# Patient Record
Sex: Female | Born: 1935 | Race: White | Hispanic: No | Marital: Married | Smoking: Never smoker
Health system: Southern US, Community
[De-identification: ages and names within clinical notes are randomized; demographics above are authoritative.]

## PROBLEM LIST (undated history)

## (undated) DIAGNOSIS — E538 Deficiency of other specified B group vitamins: Secondary | ICD-10-CM

## (undated) DIAGNOSIS — Z9289 Personal history of other medical treatment: Secondary | ICD-10-CM

## (undated) DIAGNOSIS — S329XXA Fracture of unspecified parts of lumbosacral spine and pelvis, initial encounter for closed fracture: Secondary | ICD-10-CM

## (undated) DIAGNOSIS — Z8669 Personal history of other diseases of the nervous system and sense organs: Secondary | ICD-10-CM

## (undated) DIAGNOSIS — Z8679 Personal history of other diseases of the circulatory system: Secondary | ICD-10-CM

## (undated) DIAGNOSIS — K631 Perforation of intestine (nontraumatic): Secondary | ICD-10-CM

## (undated) DIAGNOSIS — M1990 Unspecified osteoarthritis, unspecified site: Secondary | ICD-10-CM

## (undated) DIAGNOSIS — J189 Pneumonia, unspecified organism: Secondary | ICD-10-CM

## (undated) DIAGNOSIS — E669 Obesity, unspecified: Secondary | ICD-10-CM

## (undated) DIAGNOSIS — K5730 Diverticulosis of large intestine without perforation or abscess without bleeding: Secondary | ICD-10-CM

## (undated) DIAGNOSIS — N390 Urinary tract infection, site not specified: Secondary | ICD-10-CM

## (undated) DIAGNOSIS — E785 Hyperlipidemia, unspecified: Secondary | ICD-10-CM

## (undated) DIAGNOSIS — M519 Unspecified thoracic, thoracolumbar and lumbosacral intervertebral disc disorder: Secondary | ICD-10-CM

## (undated) DIAGNOSIS — Z789 Other specified health status: Secondary | ICD-10-CM

## (undated) DIAGNOSIS — R3915 Urgency of urination: Secondary | ICD-10-CM

## (undated) DIAGNOSIS — F411 Generalized anxiety disorder: Secondary | ICD-10-CM

## (undated) DIAGNOSIS — J45909 Unspecified asthma, uncomplicated: Secondary | ICD-10-CM

## (undated) DIAGNOSIS — K219 Gastro-esophageal reflux disease without esophagitis: Secondary | ICD-10-CM

## (undated) DIAGNOSIS — G4700 Insomnia, unspecified: Secondary | ICD-10-CM

## (undated) DIAGNOSIS — Z86010 Personal history of colonic polyps: Secondary | ICD-10-CM

## (undated) DIAGNOSIS — A0472 Enterocolitis due to Clostridium difficile, not specified as recurrent: Secondary | ICD-10-CM

## (undated) DIAGNOSIS — M869 Osteomyelitis, unspecified: Secondary | ICD-10-CM

## (undated) DIAGNOSIS — Z9884 Bariatric surgery status: Secondary | ICD-10-CM

## (undated) DIAGNOSIS — D539 Nutritional anemia, unspecified: Secondary | ICD-10-CM

## (undated) DIAGNOSIS — J309 Allergic rhinitis, unspecified: Secondary | ICD-10-CM

## (undated) DIAGNOSIS — B009 Herpesviral infection, unspecified: Secondary | ICD-10-CM

## (undated) DIAGNOSIS — E871 Hypo-osmolality and hyponatremia: Secondary | ICD-10-CM

## (undated) DIAGNOSIS — K439 Ventral hernia without obstruction or gangrene: Principal | ICD-10-CM

## (undated) DIAGNOSIS — I10 Essential (primary) hypertension: Secondary | ICD-10-CM

## (undated) HISTORY — PX: UPPER GI ENDOSCOPY: SHX6162

## (undated) HISTORY — DX: Nutritional anemia, unspecified: D53.9

## (undated) HISTORY — DX: Diverticulosis of large intestine without perforation or abscess without bleeding: K57.30

## (undated) HISTORY — DX: Personal history of colonic polyps: Z86.010

## (undated) HISTORY — DX: Insomnia, unspecified: G47.00

## (undated) HISTORY — DX: Herpesviral infection, unspecified: B00.9

## (undated) HISTORY — DX: Generalized anxiety disorder: F41.1

## (undated) HISTORY — PX: COLONOSCOPY: SHX174

## (undated) HISTORY — DX: Bariatric surgery status: Z98.84

## (undated) HISTORY — PX: CHOLECYSTECTOMY: SHX55

## (undated) HISTORY — DX: Enterocolitis due to Clostridium difficile, not specified as recurrent: A04.72

## (undated) HISTORY — DX: Obesity, unspecified: E66.9

## (undated) HISTORY — DX: Ventral hernia without obstruction or gangrene: K43.9

## (undated) HISTORY — DX: Deficiency of other specified B group vitamins: E53.8

## (undated) HISTORY — DX: Fracture of unspecified parts of lumbosacral spine and pelvis, initial encounter for closed fracture: S32.9XXA

## (undated) HISTORY — DX: Gastro-esophageal reflux disease without esophagitis: K21.9

## (undated) HISTORY — PX: VASCULAR SURGERY: SHX849

## (undated) HISTORY — DX: Unspecified asthma, uncomplicated: J45.909

## (undated) HISTORY — PX: TONSILLECTOMY: SUR1361

## (undated) HISTORY — DX: Hypo-osmolality and hyponatremia: E87.1

## (undated) HISTORY — DX: Urinary tract infection, site not specified: N39.0

## (undated) HISTORY — PX: OTHER SURGICAL HISTORY: SHX169

## (undated) HISTORY — DX: Unspecified thoracic, thoracolumbar and lumbosacral intervertebral disc disorder: M51.9

## (undated) HISTORY — DX: Allergic rhinitis, unspecified: J30.9

## (undated) HISTORY — DX: Urgency of urination: R39.15

---

## 1974-03-27 HISTORY — PX: OTHER SURGICAL HISTORY: SHX169

## 1992-03-27 HISTORY — PX: BACK SURGERY: SHX140

## 1997-08-04 ENCOUNTER — Ambulatory Visit (HOSPITAL_COMMUNITY): Admission: RE | Admit: 1997-08-04 | Discharge: 1997-08-04 | Payer: Self-pay | Admitting: Internal Medicine

## 1998-03-19 ENCOUNTER — Emergency Department (HOSPITAL_COMMUNITY): Admission: EM | Admit: 1998-03-19 | Discharge: 1998-03-19 | Payer: Self-pay | Admitting: Emergency Medicine

## 1998-03-19 ENCOUNTER — Encounter: Payer: Self-pay | Admitting: Emergency Medicine

## 1999-05-06 ENCOUNTER — Encounter (INDEPENDENT_AMBULATORY_CARE_PROVIDER_SITE_OTHER): Payer: Self-pay

## 1999-05-06 ENCOUNTER — Other Ambulatory Visit: Admission: RE | Admit: 1999-05-06 | Discharge: 1999-05-06 | Payer: Self-pay | Admitting: Obstetrics and Gynecology

## 1999-07-01 ENCOUNTER — Emergency Department (HOSPITAL_COMMUNITY): Admission: EM | Admit: 1999-07-01 | Discharge: 1999-07-01 | Payer: Self-pay | Admitting: Emergency Medicine

## 1999-07-01 ENCOUNTER — Encounter: Payer: Self-pay | Admitting: Emergency Medicine

## 2000-07-05 ENCOUNTER — Other Ambulatory Visit: Admission: RE | Admit: 2000-07-05 | Discharge: 2000-07-05 | Payer: Self-pay | Admitting: Obstetrics and Gynecology

## 2000-08-06 ENCOUNTER — Encounter: Payer: Self-pay | Admitting: Interventional Cardiology

## 2000-08-06 ENCOUNTER — Ambulatory Visit (HOSPITAL_COMMUNITY): Admission: RE | Admit: 2000-08-06 | Discharge: 2000-08-06 | Payer: Self-pay | Admitting: Interventional Cardiology

## 2000-09-17 ENCOUNTER — Encounter (INDEPENDENT_AMBULATORY_CARE_PROVIDER_SITE_OTHER): Payer: Self-pay

## 2000-09-17 ENCOUNTER — Ambulatory Visit (HOSPITAL_COMMUNITY): Admission: RE | Admit: 2000-09-17 | Discharge: 2000-09-17 | Payer: Self-pay | Admitting: Internal Medicine

## 2001-03-25 ENCOUNTER — Emergency Department (HOSPITAL_COMMUNITY): Admission: EM | Admit: 2001-03-25 | Discharge: 2001-03-26 | Payer: Self-pay | Admitting: Emergency Medicine

## 2001-03-26 ENCOUNTER — Encounter: Payer: Self-pay | Admitting: Emergency Medicine

## 2001-08-27 ENCOUNTER — Other Ambulatory Visit: Admission: RE | Admit: 2001-08-27 | Discharge: 2001-08-27 | Payer: Self-pay | Admitting: Obstetrics and Gynecology

## 2002-04-13 ENCOUNTER — Emergency Department (HOSPITAL_COMMUNITY): Admission: EM | Admit: 2002-04-13 | Discharge: 2002-04-13 | Payer: Self-pay | Admitting: Emergency Medicine

## 2002-08-11 ENCOUNTER — Ambulatory Visit (HOSPITAL_COMMUNITY): Admission: RE | Admit: 2002-08-11 | Discharge: 2002-08-11 | Payer: Self-pay | Admitting: Internal Medicine

## 2002-08-11 ENCOUNTER — Encounter: Payer: Self-pay | Admitting: Internal Medicine

## 2002-09-11 ENCOUNTER — Other Ambulatory Visit: Admission: RE | Admit: 2002-09-11 | Discharge: 2002-09-11 | Payer: Self-pay | Admitting: Obstetrics and Gynecology

## 2004-02-29 ENCOUNTER — Ambulatory Visit: Payer: Self-pay | Admitting: Internal Medicine

## 2004-03-16 ENCOUNTER — Ambulatory Visit: Payer: Self-pay | Admitting: Family Medicine

## 2004-03-17 ENCOUNTER — Encounter (HOSPITAL_COMMUNITY): Admission: RE | Admit: 2004-03-17 | Discharge: 2004-03-26 | Payer: Self-pay | Admitting: Family Medicine

## 2004-03-22 ENCOUNTER — Ambulatory Visit: Payer: Self-pay | Admitting: Family Medicine

## 2004-03-24 ENCOUNTER — Ambulatory Visit: Payer: Self-pay | Admitting: Family Medicine

## 2004-03-25 ENCOUNTER — Ambulatory Visit: Payer: Self-pay | Admitting: Internal Medicine

## 2004-03-29 ENCOUNTER — Ambulatory Visit: Payer: Self-pay | Admitting: Internal Medicine

## 2004-03-31 ENCOUNTER — Ambulatory Visit: Payer: Self-pay | Admitting: Family Medicine

## 2004-04-07 ENCOUNTER — Ambulatory Visit: Payer: Self-pay | Admitting: Family Medicine

## 2004-04-13 ENCOUNTER — Ambulatory Visit: Payer: Self-pay | Admitting: Internal Medicine

## 2004-04-13 ENCOUNTER — Ambulatory Visit (HOSPITAL_COMMUNITY): Admission: RE | Admit: 2004-04-13 | Discharge: 2004-04-13 | Payer: Self-pay | Admitting: Internal Medicine

## 2004-04-14 ENCOUNTER — Ambulatory Visit: Payer: Self-pay | Admitting: Family Medicine

## 2004-04-27 ENCOUNTER — Ambulatory Visit: Payer: Self-pay | Admitting: Family Medicine

## 2004-05-16 ENCOUNTER — Ambulatory Visit: Payer: Self-pay | Admitting: Family Medicine

## 2004-05-25 ENCOUNTER — Ambulatory Visit: Payer: Self-pay | Admitting: Internal Medicine

## 2004-06-23 ENCOUNTER — Ambulatory Visit: Payer: Self-pay | Admitting: Family Medicine

## 2004-07-11 ENCOUNTER — Ambulatory Visit: Payer: Self-pay | Admitting: Internal Medicine

## 2004-08-09 ENCOUNTER — Ambulatory Visit: Payer: Self-pay | Admitting: Family Medicine

## 2004-09-14 ENCOUNTER — Ambulatory Visit: Payer: Self-pay | Admitting: Family Medicine

## 2004-10-26 ENCOUNTER — Ambulatory Visit: Payer: Self-pay | Admitting: Family Medicine

## 2004-12-13 ENCOUNTER — Other Ambulatory Visit: Admission: RE | Admit: 2004-12-13 | Discharge: 2004-12-13 | Payer: Self-pay | Admitting: Obstetrics and Gynecology

## 2005-01-17 ENCOUNTER — Ambulatory Visit: Payer: Self-pay | Admitting: Family Medicine

## 2005-03-29 ENCOUNTER — Ambulatory Visit: Payer: Self-pay | Admitting: Family Medicine

## 2005-06-06 ENCOUNTER — Ambulatory Visit: Payer: Self-pay | Admitting: Family Medicine

## 2005-07-11 ENCOUNTER — Ambulatory Visit: Payer: Self-pay | Admitting: Family Medicine

## 2005-10-03 ENCOUNTER — Ambulatory Visit: Payer: Self-pay | Admitting: Family Medicine

## 2005-10-25 ENCOUNTER — Ambulatory Visit: Payer: Self-pay | Admitting: Family Medicine

## 2005-11-10 ENCOUNTER — Ambulatory Visit: Payer: Self-pay | Admitting: Family Medicine

## 2005-12-14 ENCOUNTER — Ambulatory Visit: Payer: Self-pay | Admitting: Family Medicine

## 2006-02-14 ENCOUNTER — Ambulatory Visit: Payer: Self-pay | Admitting: Internal Medicine

## 2006-04-13 ENCOUNTER — Encounter: Payer: Self-pay | Admitting: Internal Medicine

## 2006-06-14 ENCOUNTER — Ambulatory Visit: Payer: Self-pay | Admitting: Family Medicine

## 2006-06-14 LAB — CONVERTED CEMR LAB: Hemoglobin: 11.9 g/dL — ABNORMAL LOW (ref 12.0–15.0)

## 2006-06-25 ENCOUNTER — Encounter: Payer: Self-pay | Admitting: Family Medicine

## 2006-06-27 ENCOUNTER — Ambulatory Visit: Payer: Self-pay | Admitting: Family Medicine

## 2006-07-03 ENCOUNTER — Ambulatory Visit: Payer: Self-pay | Admitting: Family Medicine

## 2006-07-03 LAB — CONVERTED CEMR LAB
BUN: 15 mg/dL (ref 6–23)
Creatinine, Ser: 0.6 mg/dL (ref 0.4–1.2)

## 2006-07-04 ENCOUNTER — Encounter: Admission: RE | Admit: 2006-07-04 | Discharge: 2006-07-04 | Payer: Self-pay | Admitting: Family Medicine

## 2006-07-10 ENCOUNTER — Ambulatory Visit: Payer: Self-pay | Admitting: Family Medicine

## 2006-07-16 ENCOUNTER — Encounter: Payer: Self-pay | Admitting: Family Medicine

## 2006-07-26 ENCOUNTER — Ambulatory Visit: Payer: Self-pay | Admitting: Family Medicine

## 2006-07-26 LAB — CONVERTED CEMR LAB
ALT: 23 units/L (ref 0–40)
AST: 18 units/L (ref 0–37)
Albumin: 4.5 g/dL (ref 3.5–5.2)
Alkaline Phosphatase: 63 units/L (ref 39–117)
BUN: 9 mg/dL (ref 6–23)
Basophils Absolute: 0 10*3/uL (ref 0.0–0.1)
Basophils Relative: 0.4 % (ref 0.0–1.0)
Bilirubin, Direct: 0.1 mg/dL (ref 0.0–0.3)
CO2: 29 meq/L (ref 19–32)
Calcium: 9.7 mg/dL (ref 8.4–10.5)
Chloride: 99 meq/L (ref 96–112)
Cholesterol: 181 mg/dL (ref 0–200)
Creatinine, Ser: 0.6 mg/dL (ref 0.4–1.2)
Eosinophils Absolute: 0.1 10*3/uL (ref 0.0–0.6)
Eosinophils Relative: 0.8 % (ref 0.0–5.0)
GFR calc Af Amer: 127 mL/min
GFR calc non Af Amer: 105 mL/min
Glucose, Bld: 91 mg/dL (ref 70–99)
HCT: 40 % (ref 36.0–46.0)
HDL: 62.7 mg/dL (ref >39.0)
Hemoglobin: 13.8 g/dL (ref 12.0–15.0)
Hgb A1c MFr Bld: 5 % (ref 4.6–6.0)
LDL Cholesterol: 95 mg/dL (ref 0–99)
Lymphocytes Relative: 21 % (ref 12.0–46.0)
MCHC: 34.4 g/dL (ref 30.0–36.0)
MCV: 93.6 fL (ref 78.0–100.0)
Magnesium: 2.1 mg/dL (ref 1.5–2.5)
Monocytes Absolute: 0.8 10*3/uL — ABNORMAL HIGH (ref 0.2–0.7)
Monocytes Relative: 8.8 % (ref 3.0–11.0)
Neutro Abs: 6.1 10*3/uL (ref 1.4–7.7)
Neutrophils Relative %: 69 % (ref 43.0–77.0)
Platelets: 372 10*3/uL (ref 150–400)
Potassium: 3.2 meq/L — ABNORMAL LOW (ref 3.5–5.1)
RBC: 4.28 M/uL (ref 3.87–5.11)
RDW: 13.8 % (ref 11.5–14.6)
Sodium: 136 meq/L (ref 135–145)
TSH: 0.99 microintl units/mL (ref 0.35–5.50)
Total Bilirubin: 0.5 mg/dL (ref 0.3–1.2)
Total CHOL/HDL Ratio: 2.9
Total Protein: 7.8 g/dL (ref 6.0–8.3)
Triglycerides: 115 mg/dL (ref 0–149)
VLDL: 23 mg/dL (ref 0–40)
WBC: 8.9 10*3/uL (ref 4.5–10.5)

## 2006-08-02 ENCOUNTER — Encounter: Payer: Self-pay | Admitting: Family Medicine

## 2006-08-03 ENCOUNTER — Ambulatory Visit: Payer: Self-pay | Admitting: Family Medicine

## 2006-08-03 LAB — CONVERTED CEMR LAB: Hgb A1c MFr Bld: 5.3 % (ref 4.6–6.0)

## 2006-08-15 ENCOUNTER — Ambulatory Visit: Payer: Self-pay | Admitting: Family Medicine

## 2006-08-15 LAB — CONVERTED CEMR LAB: Hemoglobin: 12.4 g/dL (ref 12.0–15.0)

## 2006-08-23 ENCOUNTER — Encounter: Payer: Self-pay | Admitting: Family Medicine

## 2006-08-30 ENCOUNTER — Ambulatory Visit: Payer: Self-pay | Admitting: Family Medicine

## 2006-09-14 ENCOUNTER — Encounter: Payer: Self-pay | Admitting: Family Medicine

## 2006-09-18 ENCOUNTER — Encounter: Payer: Self-pay | Admitting: Family Medicine

## 2006-09-18 DIAGNOSIS — Z8719 Personal history of other diseases of the digestive system: Secondary | ICD-10-CM | POA: Insufficient documentation

## 2006-09-18 DIAGNOSIS — J309 Allergic rhinitis, unspecified: Secondary | ICD-10-CM | POA: Insufficient documentation

## 2006-09-18 DIAGNOSIS — J45909 Unspecified asthma, uncomplicated: Secondary | ICD-10-CM | POA: Insufficient documentation

## 2006-10-30 ENCOUNTER — Ambulatory Visit: Payer: Self-pay | Admitting: Family Medicine

## 2006-10-30 ENCOUNTER — Ambulatory Visit: Payer: Self-pay | Admitting: Vascular Surgery

## 2006-10-30 DIAGNOSIS — M549 Dorsalgia, unspecified: Secondary | ICD-10-CM

## 2006-10-30 DIAGNOSIS — M5489 Other dorsalgia: Secondary | ICD-10-CM | POA: Insufficient documentation

## 2006-12-12 ENCOUNTER — Encounter: Payer: Self-pay | Admitting: Family Medicine

## 2007-01-15 ENCOUNTER — Telehealth: Payer: Self-pay | Admitting: Family Medicine

## 2007-01-30 ENCOUNTER — Telehealth: Payer: Self-pay | Admitting: Family Medicine

## 2007-02-08 ENCOUNTER — Ambulatory Visit: Payer: Self-pay | Admitting: Internal Medicine

## 2007-02-08 LAB — CONVERTED CEMR LAB
Basophils Absolute: 0 10*3/uL (ref 0.0–0.1)
Basophils Relative: 0.6 % (ref 0.0–1.0)
Eosinophils Absolute: 0.2 10*3/uL (ref 0.0–0.6)
Eosinophils Relative: 3.1 % (ref 0.0–5.0)
HCT: 32.3 % — ABNORMAL LOW (ref 36.0–46.0)
Hemoglobin: 11 g/dL — ABNORMAL LOW (ref 12.0–15.0)
Lymphocytes Relative: 26.1 % (ref 12.0–46.0)
MCHC: 34.1 g/dL (ref 30.0–36.0)
MCV: 93.2 fL (ref 78.0–100.0)
Monocytes Absolute: 0.6 10*3/uL (ref 0.2–0.7)
Monocytes Relative: 8.1 % (ref 3.0–11.0)
Neutro Abs: 4.9 10*3/uL (ref 1.4–7.7)
Neutrophils Relative %: 62.1 % (ref 43.0–77.0)
Platelets: 327 10*3/uL (ref 150–400)
RBC: 3.47 M/uL — ABNORMAL LOW (ref 3.87–5.11)
RDW: 14.3 % (ref 11.5–14.6)
WBC: 7.7 10*3/uL (ref 4.5–10.5)

## 2007-04-11 ENCOUNTER — Encounter: Payer: Self-pay | Admitting: Family Medicine

## 2007-05-22 ENCOUNTER — Ambulatory Visit: Payer: Self-pay | Admitting: Internal Medicine

## 2007-07-30 ENCOUNTER — Telehealth: Payer: Self-pay | Admitting: Family Medicine

## 2007-08-06 ENCOUNTER — Telehealth: Payer: Self-pay | Admitting: Family Medicine

## 2007-08-07 ENCOUNTER — Telehealth: Payer: Self-pay | Admitting: Family Medicine

## 2007-08-08 ENCOUNTER — Ambulatory Visit: Payer: Self-pay | Admitting: Family Medicine

## 2007-08-08 DIAGNOSIS — R5383 Other fatigue: Secondary | ICD-10-CM

## 2007-08-08 DIAGNOSIS — F411 Generalized anxiety disorder: Secondary | ICD-10-CM | POA: Insufficient documentation

## 2007-08-08 DIAGNOSIS — D539 Nutritional anemia, unspecified: Secondary | ICD-10-CM | POA: Insufficient documentation

## 2007-08-08 DIAGNOSIS — Z9884 Bariatric surgery status: Secondary | ICD-10-CM | POA: Insufficient documentation

## 2007-08-08 DIAGNOSIS — G4700 Insomnia, unspecified: Secondary | ICD-10-CM | POA: Insufficient documentation

## 2007-08-08 DIAGNOSIS — R5381 Other malaise: Secondary | ICD-10-CM | POA: Insufficient documentation

## 2007-08-08 DIAGNOSIS — N3000 Acute cystitis without hematuria: Secondary | ICD-10-CM | POA: Insufficient documentation

## 2007-08-08 LAB — CONVERTED CEMR LAB
Bilirubin Urine: NEGATIVE
Blood in Urine, dipstick: NEGATIVE
Glucose, Urine, Semiquant: NEGATIVE
Ketones, urine, test strip: NEGATIVE
Nitrite: NEGATIVE
Protein, U semiquant: NEGATIVE
Specific Gravity, Urine: <1.005
Urobilinogen, UA: 0.2
WBC Urine, dipstick: NEGATIVE
pH: 5

## 2007-08-14 ENCOUNTER — Telehealth: Payer: Self-pay | Admitting: Family Medicine

## 2007-08-14 LAB — CONVERTED CEMR LAB
Basophils Absolute: 0 10*3/uL (ref 0.0–0.1)
Basophils Relative: 0.2 % (ref 0.0–1.0)
Eosinophils Absolute: 0.1 10*3/uL (ref 0.0–0.7)
Eosinophils Relative: 2 % (ref 0.0–5.0)
HCT: 34.7 % — ABNORMAL LOW (ref 36.0–46.0)
Hemoglobin: 11.6 g/dL — ABNORMAL LOW (ref 12.0–15.0)
Lymphocytes Relative: 32.8 % (ref 12.0–46.0)
MCHC: 33.4 g/dL (ref 30.0–36.0)
MCV: 93.3 fL (ref 78.0–100.0)
Monocytes Absolute: 0.6 10*3/uL (ref 0.1–1.0)
Monocytes Relative: 10.3 % (ref 3.0–12.0)
Neutro Abs: 3.3 10*3/uL (ref 1.4–7.7)
Neutrophils Relative %: 54.7 % (ref 43.0–77.0)
Platelets: 304 10*3/uL (ref 150–400)
RBC: 3.72 M/uL — ABNORMAL LOW (ref 3.87–5.11)
RDW: 14.2 % (ref 11.5–14.6)
WBC: 6 10*3/uL (ref 4.5–10.5)

## 2007-08-21 ENCOUNTER — Telehealth: Payer: Self-pay | Admitting: Family Medicine

## 2007-09-26 ENCOUNTER — Ambulatory Visit: Payer: Self-pay | Admitting: Family Medicine

## 2007-09-26 DIAGNOSIS — R3915 Urgency of urination: Secondary | ICD-10-CM | POA: Insufficient documentation

## 2007-09-26 LAB — CONVERTED CEMR LAB
Bilirubin Urine: NEGATIVE
Blood in Urine, dipstick: NEGATIVE
Glucose, Urine, Semiquant: NEGATIVE
Ketones, urine, test strip: NEGATIVE
Nitrite: NEGATIVE
Protein, U semiquant: NEGATIVE
Specific Gravity, Urine: 1.01
Urobilinogen, UA: 0.2
WBC Urine, dipstick: NEGATIVE
pH: 6

## 2007-10-01 ENCOUNTER — Telehealth: Payer: Self-pay | Admitting: Family Medicine

## 2007-10-08 ENCOUNTER — Telehealth: Payer: Self-pay | Admitting: Family Medicine

## 2007-10-21 ENCOUNTER — Encounter: Payer: Self-pay | Admitting: Family Medicine

## 2007-10-25 ENCOUNTER — Encounter: Admission: RE | Admit: 2007-10-25 | Discharge: 2007-10-25 | Payer: Self-pay | Admitting: Neurosurgery

## 2007-10-27 ENCOUNTER — Emergency Department (HOSPITAL_COMMUNITY): Admission: EM | Admit: 2007-10-27 | Discharge: 2007-10-27 | Payer: Self-pay | Admitting: Emergency Medicine

## 2007-10-30 ENCOUNTER — Telehealth: Payer: Self-pay | Admitting: Family Medicine

## 2007-11-19 ENCOUNTER — Encounter: Admission: RE | Admit: 2007-11-19 | Discharge: 2007-11-19 | Payer: Self-pay | Admitting: Neurosurgery

## 2007-11-20 ENCOUNTER — Ambulatory Visit: Payer: Self-pay | Admitting: Family Medicine

## 2007-11-20 DIAGNOSIS — K5289 Other specified noninfective gastroenteritis and colitis: Secondary | ICD-10-CM | POA: Insufficient documentation

## 2007-11-20 LAB — CONVERTED CEMR LAB
Bilirubin Urine: NEGATIVE
Glucose, Urine, Semiquant: NEGATIVE
Ketones, urine, test strip: NEGATIVE
Nitrite: POSITIVE
Specific Gravity, Urine: <1.005
Urobilinogen, UA: 0.2
pH: 5.5

## 2007-11-21 ENCOUNTER — Telehealth: Payer: Self-pay | Admitting: Family Medicine

## 2007-11-21 ENCOUNTER — Encounter: Payer: Self-pay | Admitting: Family Medicine

## 2007-11-22 LAB — CONVERTED CEMR LAB
BUN: 16 mg/dL (ref 6–23)
Basophils Absolute: 0 10*3/uL (ref 0.0–0.1)
Basophils Relative: 0.4 % (ref 0.0–3.0)
CO2: 28 meq/L (ref 19–32)
Calcium: 8.9 mg/dL (ref 8.4–10.5)
Chloride: 107 meq/L (ref 96–112)
Creatinine, Ser: 0.6 mg/dL (ref 0.4–1.2)
Eosinophils Absolute: 0.3 10*3/uL (ref 0.0–0.7)
Eosinophils Relative: 3.8 % (ref 0.0–5.0)
GFR calc Af Amer: 126 mL/min
GFR calc non Af Amer: 104 mL/min
Glucose, Bld: 89 mg/dL (ref 70–99)
HCT: 27.6 % — ABNORMAL LOW (ref 36.0–46.0)
Hemoglobin: 9.4 g/dL — ABNORMAL LOW (ref 12.0–15.0)
Lymphocytes Relative: 27 % (ref 12.0–46.0)
MCHC: 34.3 g/dL (ref 30.0–36.0)
MCV: 98.5 fL (ref 78.0–100.0)
Monocytes Absolute: 0.7 10*3/uL (ref 0.1–1.0)
Monocytes Relative: 9.7 % (ref 3.0–12.0)
Neutro Abs: 4 10*3/uL (ref 1.4–7.7)
Neutrophils Relative %: 59.1 % (ref 43.0–77.0)
Platelets: 294 10*3/uL (ref 150–400)
Potassium: 3.5 meq/L (ref 3.5–5.1)
RBC: 2.8 M/uL — ABNORMAL LOW (ref 3.87–5.11)
RDW: 15.9 % — ABNORMAL HIGH (ref 11.5–14.6)
Sodium: 139 meq/L (ref 135–145)
WBC: 6.9 10*3/uL (ref 4.5–10.5)

## 2007-11-28 ENCOUNTER — Telehealth: Payer: Self-pay | Admitting: Family Medicine

## 2007-12-04 ENCOUNTER — Telehealth: Payer: Self-pay | Admitting: Family Medicine

## 2007-12-05 ENCOUNTER — Ambulatory Visit: Payer: Self-pay | Admitting: Family Medicine

## 2007-12-05 LAB — CONVERTED CEMR LAB
Bilirubin Urine: NEGATIVE
Blood in Urine, dipstick: NEGATIVE
Glucose, Urine, Semiquant: NEGATIVE
Hemoglobin: 10.8 g/dL — ABNORMAL LOW (ref 12.0–15.0)
Ketones, urine, test strip: NEGATIVE
Nitrite: NEGATIVE
Protein, U semiquant: NEGATIVE
Specific Gravity, Urine: <1.005
Urobilinogen, UA: 0.2
pH: 6

## 2007-12-30 ENCOUNTER — Encounter: Payer: Self-pay | Admitting: Family Medicine

## 2008-01-16 ENCOUNTER — Telehealth: Payer: Self-pay | Admitting: Family Medicine

## 2008-01-21 ENCOUNTER — Telehealth: Payer: Self-pay | Admitting: Family Medicine

## 2008-01-22 ENCOUNTER — Ambulatory Visit: Payer: Self-pay | Admitting: Family Medicine

## 2008-01-29 ENCOUNTER — Telehealth: Payer: Self-pay | Admitting: Family Medicine

## 2008-01-29 LAB — CONVERTED CEMR LAB: Hemoglobin: 12.1 g/dL (ref 12.0–15.0)

## 2008-02-11 ENCOUNTER — Ambulatory Visit: Payer: Self-pay | Admitting: Family Medicine

## 2008-02-11 LAB — CONVERTED CEMR LAB
Bilirubin Urine: NEGATIVE
Glucose, Urine, Semiquant: NEGATIVE
Ketones, urine, test strip: NEGATIVE
Nitrite: POSITIVE
Protein, U semiquant: NEGATIVE
Specific Gravity, Urine: <1.005
Urobilinogen, UA: 0.2
pH: 6

## 2008-02-12 ENCOUNTER — Telehealth: Payer: Self-pay | Admitting: Family Medicine

## 2008-02-25 ENCOUNTER — Ambulatory Visit: Payer: Self-pay | Admitting: Family Medicine

## 2008-02-25 LAB — CONVERTED CEMR LAB
Bilirubin Urine: NEGATIVE
Blood in Urine, dipstick: NEGATIVE
Glucose, Urine, Semiquant: NEGATIVE
Ketones, urine, test strip: NEGATIVE
Nitrite: POSITIVE
Protein, U semiquant: NEGATIVE
Specific Gravity, Urine: <1.005
Urobilinogen, UA: 0.2
pH: 6.5

## 2008-02-26 ENCOUNTER — Ambulatory Visit: Payer: Self-pay | Admitting: Family Medicine

## 2008-02-26 ENCOUNTER — Telehealth: Payer: Self-pay | Admitting: Family Medicine

## 2008-02-26 DIAGNOSIS — N390 Urinary tract infection, site not specified: Secondary | ICD-10-CM | POA: Insufficient documentation

## 2008-02-27 ENCOUNTER — Encounter: Payer: Self-pay | Admitting: Family Medicine

## 2008-02-27 ENCOUNTER — Telehealth: Payer: Self-pay | Admitting: Family Medicine

## 2008-03-04 ENCOUNTER — Ambulatory Visit: Payer: Self-pay | Admitting: Family Medicine

## 2008-03-04 DIAGNOSIS — R209 Unspecified disturbances of skin sensation: Secondary | ICD-10-CM | POA: Insufficient documentation

## 2008-03-27 DIAGNOSIS — Z860100 Personal history of colon polyps, unspecified: Secondary | ICD-10-CM

## 2008-03-27 HISTORY — DX: Personal history of colon polyps, unspecified: Z86.0100

## 2008-03-27 HISTORY — DX: Personal history of colonic polyps: Z86.010

## 2008-04-06 ENCOUNTER — Encounter: Payer: Self-pay | Admitting: Family Medicine

## 2008-04-09 ENCOUNTER — Ambulatory Visit: Payer: Self-pay | Admitting: Family Medicine

## 2008-04-09 LAB — CONVERTED CEMR LAB
Bilirubin Urine: NEGATIVE
Glucose, Urine, Semiquant: NEGATIVE
Ketones, urine, test strip: NEGATIVE
Nitrite: POSITIVE
Protein, U semiquant: NEGATIVE
Specific Gravity, Urine: 1.01
Urobilinogen, UA: 0.2
pH: 6

## 2008-04-10 ENCOUNTER — Encounter: Payer: Self-pay | Admitting: Family Medicine

## 2008-04-13 ENCOUNTER — Telehealth: Payer: Self-pay | Admitting: Family Medicine

## 2008-04-14 ENCOUNTER — Ambulatory Visit: Payer: Self-pay | Admitting: Family Medicine

## 2008-04-16 ENCOUNTER — Ambulatory Visit: Payer: Self-pay | Admitting: Family Medicine

## 2008-04-16 ENCOUNTER — Telehealth: Payer: Self-pay | Admitting: Family Medicine

## 2008-04-16 DIAGNOSIS — J040 Acute laryngitis: Secondary | ICD-10-CM | POA: Insufficient documentation

## 2008-04-16 DIAGNOSIS — J029 Acute pharyngitis, unspecified: Secondary | ICD-10-CM | POA: Insufficient documentation

## 2008-04-24 ENCOUNTER — Encounter: Payer: Self-pay | Admitting: Family Medicine

## 2008-05-11 ENCOUNTER — Encounter: Payer: Self-pay | Admitting: Family Medicine

## 2008-06-04 ENCOUNTER — Telehealth: Payer: Self-pay | Admitting: Family Medicine

## 2008-06-05 ENCOUNTER — Telehealth: Payer: Self-pay | Admitting: Internal Medicine

## 2008-06-08 ENCOUNTER — Ambulatory Visit: Payer: Self-pay | Admitting: Internal Medicine

## 2008-06-09 LAB — CONVERTED CEMR LAB
Basophils Absolute: 0.1 10*3/uL (ref 0.0–0.1)
Basophils Relative: 0.8 % (ref 0.0–3.0)
Eosinophils Absolute: 0.3 10*3/uL (ref 0.0–0.7)
Eosinophils Relative: 5.3 % — ABNORMAL HIGH (ref 0.0–5.0)
HCT: 33.1 % — ABNORMAL LOW (ref 36.0–46.0)
Hemoglobin: 11.2 g/dL — ABNORMAL LOW (ref 12.0–15.0)
Iron: 50 ug/dL (ref 42–145)
Lymphocytes Relative: 30.6 % (ref 12.0–46.0)
MCHC: 34 g/dL (ref 30.0–36.0)
MCV: 93.6 fL (ref 78.0–100.0)
Monocytes Absolute: 0.7 10*3/uL (ref 0.1–1.0)
Monocytes Relative: 10.7 % (ref 3.0–12.0)
Neutro Abs: 3.3 10*3/uL (ref 1.4–7.7)
Neutrophils Relative %: 52.6 % (ref 43.0–77.0)
Platelets: 252 10*3/uL (ref 150–400)
RBC: 3.53 M/uL — ABNORMAL LOW (ref 3.87–5.11)
RDW: 14.6 % (ref 11.5–14.6)
Saturation Ratios: 17 % — ABNORMAL LOW (ref 20.0–50.0)
Sed Rate: 30 mm/hr — ABNORMAL HIGH (ref 0–22)
Transferrin: 210.3 mg/dL — ABNORMAL LOW (ref 212.0–360.0)
Vitamin B-12: 653 pg/mL (ref 211–911)
WBC: 6.4 10*3/uL (ref 4.5–10.5)

## 2008-06-12 ENCOUNTER — Encounter: Payer: Self-pay | Admitting: Family Medicine

## 2008-06-25 ENCOUNTER — Telehealth: Payer: Self-pay | Admitting: Family Medicine

## 2008-07-07 ENCOUNTER — Ambulatory Visit: Payer: Self-pay | Admitting: Internal Medicine

## 2008-07-08 LAB — CONVERTED CEMR LAB
Basophils Absolute: 0.1 10*3/uL (ref 0.0–0.1)
Basophils Relative: 0.8 % (ref 0.0–3.0)
Eosinophils Absolute: 0.2 10*3/uL (ref 0.0–0.7)
Eosinophils Relative: 2.5 % (ref 0.0–5.0)
HCT: 31.3 % — ABNORMAL LOW (ref 36.0–46.0)
Hemoglobin: 10.8 g/dL — ABNORMAL LOW (ref 12.0–15.0)
Iron: 50 ug/dL (ref 42–145)
Lymphocytes Relative: 30.4 % (ref 12.0–46.0)
Lymphs Abs: 2.3 10*3/uL (ref 0.7–4.0)
MCHC: 34.5 g/dL (ref 30.0–36.0)
MCV: 92.6 fL (ref 78.0–100.0)
Monocytes Absolute: 0.6 10*3/uL (ref 0.1–1.0)
Monocytes Relative: 7.8 % (ref 3.0–12.0)
Neutro Abs: 4.3 10*3/uL (ref 1.4–7.7)
Neutrophils Relative %: 58.5 % (ref 43.0–77.0)
Platelets: 244 10*3/uL (ref 150.0–400.0)
RBC: 3.38 M/uL — ABNORMAL LOW (ref 3.87–5.11)
RDW: 14.4 % (ref 11.5–14.6)
Saturation Ratios: 16.9 % — ABNORMAL LOW (ref 20.0–50.0)
Sed Rate: 22 mm/hr (ref 0–22)
Transferrin: 211.7 mg/dL — ABNORMAL LOW (ref 212.0–360.0)
Vitamin B-12: 261 pg/mL (ref 211–911)
WBC: 7.5 10*3/uL (ref 4.5–10.5)

## 2008-07-09 DIAGNOSIS — Z860100 Personal history of colon polyps, unspecified: Secondary | ICD-10-CM | POA: Insufficient documentation

## 2008-07-09 DIAGNOSIS — K222 Esophageal obstruction: Secondary | ICD-10-CM | POA: Insufficient documentation

## 2008-07-09 DIAGNOSIS — K5730 Diverticulosis of large intestine without perforation or abscess without bleeding: Secondary | ICD-10-CM | POA: Insufficient documentation

## 2008-07-09 DIAGNOSIS — D649 Anemia, unspecified: Secondary | ICD-10-CM | POA: Insufficient documentation

## 2008-07-09 DIAGNOSIS — E538 Deficiency of other specified B group vitamins: Secondary | ICD-10-CM | POA: Insufficient documentation

## 2008-07-13 ENCOUNTER — Telehealth: Payer: Self-pay | Admitting: Internal Medicine

## 2008-07-13 ENCOUNTER — Ambulatory Visit: Payer: Self-pay | Admitting: Internal Medicine

## 2008-07-15 ENCOUNTER — Ambulatory Visit (HOSPITAL_COMMUNITY): Admission: RE | Admit: 2008-07-15 | Discharge: 2008-07-15 | Payer: Self-pay | Admitting: Internal Medicine

## 2008-07-15 ENCOUNTER — Encounter: Payer: Self-pay | Admitting: Internal Medicine

## 2008-07-20 ENCOUNTER — Telehealth: Payer: Self-pay | Admitting: Internal Medicine

## 2008-08-03 ENCOUNTER — Ambulatory Visit: Payer: Self-pay | Admitting: Internal Medicine

## 2008-08-04 ENCOUNTER — Ambulatory Visit: Payer: Self-pay | Admitting: Internal Medicine

## 2008-08-04 LAB — CONVERTED CEMR LAB
Basophils Absolute: 0 10*3/uL (ref 0.0–0.1)
Basophils Relative: 0.5 % (ref 0.0–3.0)
CEA: 5.5 ng/mL — ABNORMAL HIGH (ref 0.0–5.0)
Eosinophils Absolute: 0.3 10*3/uL (ref 0.0–0.7)
Eosinophils Relative: 4 % (ref 0.0–5.0)
HCT: 33 % — ABNORMAL LOW (ref 36.0–46.0)
Hemoglobin: 11.4 g/dL — ABNORMAL LOW (ref 12.0–15.0)
Iron: 51 ug/dL (ref 42–145)
Lymphocytes Relative: 28.7 % (ref 12.0–46.0)
Lymphs Abs: 2.4 10*3/uL (ref 0.7–4.0)
MCHC: 34.6 g/dL (ref 30.0–36.0)
MCV: 95.8 fL (ref 78.0–100.0)
Monocytes Absolute: 0.8 10*3/uL (ref 0.1–1.0)
Monocytes Relative: 9.8 % (ref 3.0–12.0)
Neutro Abs: 4.8 10*3/uL (ref 1.4–7.7)
Neutrophils Relative %: 57 % (ref 43.0–77.0)
Platelets: 254 10*3/uL (ref 150.0–400.0)
RBC: 3.45 M/uL — ABNORMAL LOW (ref 3.87–5.11)
RDW: 14.2 % (ref 11.5–14.6)
Saturation Ratios: 17.4 % — ABNORMAL LOW (ref 20.0–50.0)
Tissue Transglutaminase Ab, IgA: 0.5 units (ref <7)
Transferrin: 209.6 mg/dL — ABNORMAL LOW (ref 212.0–360.0)
WBC: 8.3 10*3/uL (ref 4.5–10.5)

## 2008-08-06 ENCOUNTER — Encounter: Payer: Self-pay | Admitting: Internal Medicine

## 2008-08-25 ENCOUNTER — Encounter: Payer: Self-pay | Admitting: Internal Medicine

## 2008-08-25 ENCOUNTER — Ambulatory Visit: Payer: Self-pay | Admitting: Internal Medicine

## 2008-08-25 LAB — HM COLONOSCOPY

## 2008-08-26 ENCOUNTER — Telehealth: Payer: Self-pay | Admitting: Internal Medicine

## 2008-08-26 ENCOUNTER — Telehealth: Payer: Self-pay | Admitting: Family Medicine

## 2008-08-26 LAB — CONVERTED CEMR LAB
Basophils Absolute: 0 10*3/uL (ref 0.0–0.1)
Basophils Relative: 0.6 % (ref 0.0–3.0)
Eosinophils Absolute: 0.4 10*3/uL (ref 0.0–0.7)
Eosinophils Relative: 5.5 % — ABNORMAL HIGH (ref 0.0–5.0)
HCT: 35.2 % — ABNORMAL LOW (ref 36.0–46.0)
Hemoglobin: 11.8 g/dL — ABNORMAL LOW (ref 12.0–15.0)
Iron: 34 ug/dL — ABNORMAL LOW (ref 42–145)
Lymphocytes Relative: 27.7 % (ref 12.0–46.0)
Lymphs Abs: 1.8 10*3/uL (ref 0.7–4.0)
MCHC: 33.5 g/dL (ref 30.0–36.0)
MCV: 95.6 fL (ref 78.0–100.0)
Monocytes Absolute: 0.6 10*3/uL (ref 0.1–1.0)
Monocytes Relative: 8.8 % (ref 3.0–12.0)
Neutro Abs: 3.6 10*3/uL (ref 1.4–7.7)
Neutrophils Relative %: 57.4 % (ref 43.0–77.0)
Platelets: 246 10*3/uL (ref 150.0–400.0)
RBC: 3.68 M/uL — ABNORMAL LOW (ref 3.87–5.11)
RDW: 14.1 % (ref 11.5–14.6)
Saturation Ratios: 12.5 % — ABNORMAL LOW (ref 20.0–50.0)
Transferrin: 194.8 mg/dL — ABNORMAL LOW (ref 212.0–360.0)
WBC: 6.4 10*3/uL (ref 4.5–10.5)

## 2008-08-28 ENCOUNTER — Encounter: Payer: Self-pay | Admitting: Internal Medicine

## 2008-09-07 ENCOUNTER — Encounter: Payer: Self-pay | Admitting: Internal Medicine

## 2008-09-07 ENCOUNTER — Encounter (HOSPITAL_COMMUNITY): Admission: RE | Admit: 2008-09-07 | Discharge: 2008-09-23 | Payer: Self-pay | Admitting: Internal Medicine

## 2008-09-17 ENCOUNTER — Telehealth: Payer: Self-pay | Admitting: Family Medicine

## 2008-10-08 ENCOUNTER — Ambulatory Visit: Payer: Self-pay | Admitting: Internal Medicine

## 2008-10-09 LAB — CONVERTED CEMR LAB
Basophils Absolute: 0 10*3/uL (ref 0.0–0.1)
Basophils Relative: 0.7 % (ref 0.0–3.0)
CO2: 29 meq/L (ref 19–32)
Chloride: 103 meq/L (ref 96–112)
Eosinophils Absolute: 0.2 10*3/uL (ref 0.0–0.7)
Eosinophils Relative: 3 % (ref 0.0–5.0)
HCT: 34.7 % — ABNORMAL LOW (ref 36.0–46.0)
Hemoglobin: 11.9 g/dL — ABNORMAL LOW (ref 12.0–15.0)
Iron: 56 ug/dL (ref 42–145)
Lymphocytes Relative: 27.7 % (ref 12.0–46.0)
Lymphs Abs: 1.9 10*3/uL (ref 0.7–4.0)
MCHC: 34.4 g/dL (ref 30.0–36.0)
MCV: 95.8 fL (ref 78.0–100.0)
Monocytes Absolute: 0.6 10*3/uL (ref 0.1–1.0)
Monocytes Relative: 9.3 % (ref 3.0–12.0)
Neutro Abs: 4.2 10*3/uL (ref 1.4–7.7)
Neutrophils Relative %: 59.3 % (ref 43.0–77.0)
Platelets: 271 10*3/uL (ref 150.0–400.0)
Potassium: 4.1 meq/L (ref 3.5–5.1)
RBC: 3.62 M/uL — ABNORMAL LOW (ref 3.87–5.11)
RDW: 13.3 % (ref 11.5–14.6)
Saturation Ratios: 20.5 % (ref 20.0–50.0)
Sodium: 137 meq/L (ref 135–145)
Transferrin: 195.3 mg/dL — ABNORMAL LOW (ref 212.0–360.0)
WBC: 6.9 10*3/uL (ref 4.5–10.5)

## 2008-10-13 ENCOUNTER — Encounter: Payer: Self-pay | Admitting: Family Medicine

## 2008-11-04 ENCOUNTER — Ambulatory Visit: Payer: Self-pay | Admitting: Family Medicine

## 2008-11-05 ENCOUNTER — Telehealth: Payer: Self-pay | Admitting: Family Medicine

## 2008-11-24 ENCOUNTER — Ambulatory Visit: Payer: Self-pay | Admitting: Internal Medicine

## 2008-11-24 DIAGNOSIS — D509 Iron deficiency anemia, unspecified: Secondary | ICD-10-CM | POA: Insufficient documentation

## 2008-11-25 LAB — CONVERTED CEMR LAB
Basophils Absolute: 0 10*3/uL (ref 0.0–0.1)
Basophils Relative: 0.5 % (ref 0.0–3.0)
CO2: 27 meq/L (ref 19–32)
Chloride: 108 meq/L (ref 96–112)
Eosinophils Absolute: 0.9 10*3/uL — ABNORMAL HIGH (ref 0.0–0.7)
Eosinophils Relative: 9 % — ABNORMAL HIGH (ref 0.0–5.0)
HCT: 35.7 % — ABNORMAL LOW (ref 36.0–46.0)
Hemoglobin: 11.9 g/dL — ABNORMAL LOW (ref 12.0–15.0)
Iron: 40 ug/dL — ABNORMAL LOW (ref 42–145)
Lymphocytes Relative: 21.3 % (ref 12.0–46.0)
Lymphs Abs: 2 10*3/uL (ref 0.7–4.0)
MCHC: 33.4 g/dL (ref 30.0–36.0)
MCV: 97.3 fL (ref 78.0–100.0)
Monocytes Absolute: 0.9 10*3/uL (ref 0.1–1.0)
Monocytes Relative: 9 % (ref 3.0–12.0)
Neutro Abs: 5.7 10*3/uL (ref 1.4–7.7)
Neutrophils Relative %: 60.2 % (ref 43.0–77.0)
Platelets: 216 10*3/uL (ref 150.0–400.0)
Potassium: 3.8 meq/L (ref 3.5–5.1)
RBC: 3.67 M/uL — ABNORMAL LOW (ref 3.87–5.11)
RDW: 12.9 % (ref 11.5–14.6)
Saturation Ratios: 16.2 % — ABNORMAL LOW (ref 20.0–50.0)
Sodium: 140 meq/L (ref 135–145)
Transferrin: 176.5 mg/dL — ABNORMAL LOW (ref 212.0–360.0)
WBC: 9.5 10*3/uL (ref 4.5–10.5)

## 2008-12-21 ENCOUNTER — Telehealth: Payer: Self-pay | Admitting: Internal Medicine

## 2008-12-22 ENCOUNTER — Ambulatory Visit: Payer: Self-pay | Admitting: Internal Medicine

## 2008-12-23 LAB — CONVERTED CEMR LAB
Basophils Absolute: 0 10*3/uL (ref 0.0–0.1)
Basophils Relative: 0.5 % (ref 0.0–3.0)
Eosinophils Absolute: 0.4 10*3/uL (ref 0.0–0.7)
Eosinophils Relative: 5.2 % — ABNORMAL HIGH (ref 0.0–5.0)
Ferritin: 434.9 ng/mL — ABNORMAL HIGH (ref 10.0–291.0)
HCT: 34.6 % — ABNORMAL LOW (ref 36.0–46.0)
Hemoglobin: 11.7 g/dL — ABNORMAL LOW (ref 12.0–15.0)
Iron: 50 ug/dL (ref 42–145)
Lymphocytes Relative: 27 % (ref 12.0–46.0)
Lymphs Abs: 2.1 10*3/uL (ref 0.7–4.0)
MCHC: 33.7 g/dL (ref 30.0–36.0)
MCV: 96.6 fL (ref 78.0–100.0)
Monocytes Absolute: 0.7 10*3/uL (ref 0.1–1.0)
Monocytes Relative: 8.8 % (ref 3.0–12.0)
Neutro Abs: 4.6 10*3/uL (ref 1.4–7.7)
Neutrophils Relative %: 58.5 % (ref 43.0–77.0)
Platelets: 220 10*3/uL (ref 150.0–400.0)
RBC: 3.58 M/uL — ABNORMAL LOW (ref 3.87–5.11)
RDW: 12.7 % (ref 11.5–14.6)
Saturation Ratios: 21.2 % (ref 20.0–50.0)
Transferrin: 168.5 mg/dL — ABNORMAL LOW (ref 212.0–360.0)
Vitamin B-12: 235 pg/mL (ref 211–911)
WBC: 7.8 10*3/uL (ref 4.5–10.5)

## 2008-12-24 ENCOUNTER — Ambulatory Visit: Payer: Self-pay | Admitting: Family Medicine

## 2008-12-30 ENCOUNTER — Telehealth: Payer: Self-pay | Admitting: Family Medicine

## 2009-01-27 LAB — CONVERTED CEMR LAB
BUN: 13 mg/dL (ref 6–23)
CO2: 28 meq/L (ref 19–32)
Calcium: 8.9 mg/dL (ref 8.4–10.5)
Chloride: 104 meq/L (ref 96–112)
Creatinine, Ser: 0.6 mg/dL (ref 0.4–1.2)
GFR calc non Af Amer: 104.01 mL/min (ref >60)
Glucose, Bld: 86 mg/dL (ref 70–99)
Potassium: 3.9 meq/L (ref 3.5–5.1)
Sodium: 138 meq/L (ref 135–145)

## 2009-02-02 ENCOUNTER — Ambulatory Visit: Payer: Self-pay | Admitting: Internal Medicine

## 2009-02-04 ENCOUNTER — Telehealth: Payer: Self-pay | Admitting: Internal Medicine

## 2009-02-05 ENCOUNTER — Encounter (INDEPENDENT_AMBULATORY_CARE_PROVIDER_SITE_OTHER): Payer: Self-pay | Admitting: *Deleted

## 2009-02-12 ENCOUNTER — Telehealth: Payer: Self-pay | Admitting: Internal Medicine

## 2009-02-24 ENCOUNTER — Telehealth: Payer: Self-pay | Admitting: Family Medicine

## 2009-03-02 ENCOUNTER — Ambulatory Visit: Payer: Self-pay | Admitting: Internal Medicine

## 2009-03-03 LAB — CONVERTED CEMR LAB
Basophils Absolute: 0 10*3/uL (ref 0.0–0.1)
Basophils Relative: 0.4 % (ref 0.0–3.0)
Eosinophils Absolute: 0.2 10*3/uL (ref 0.0–0.7)
Eosinophils Relative: 2.5 % (ref 0.0–5.0)
Ferritin: 447.1 ng/mL — ABNORMAL HIGH (ref 10.0–291.0)
HCT: 32.9 % — ABNORMAL LOW (ref 36.0–46.0)
Hemoglobin: 11.2 g/dL — ABNORMAL LOW (ref 12.0–15.0)
Iron: 60 ug/dL (ref 42–145)
Lymphocytes Relative: 32.4 % (ref 12.0–46.0)
Lymphs Abs: 2.4 10*3/uL (ref 0.7–4.0)
MCHC: 34.2 g/dL (ref 30.0–36.0)
MCV: 99.8 fL (ref 78.0–100.0)
Monocytes Absolute: 0.7 10*3/uL (ref 0.1–1.0)
Monocytes Relative: 9.2 % (ref 3.0–12.0)
Neutro Abs: 4.1 10*3/uL (ref 1.4–7.7)
Neutrophils Relative %: 55.5 % (ref 43.0–77.0)
Platelets: 264 10*3/uL (ref 150.0–400.0)
RBC: 3.29 M/uL — ABNORMAL LOW (ref 3.87–5.11)
RDW: 12.8 % (ref 11.5–14.6)
Saturation Ratios: 21.6 % (ref 20.0–50.0)
Transferrin: 198.7 mg/dL — ABNORMAL LOW (ref 212.0–360.0)
Vitamin B-12: 281 pg/mL (ref 211–911)
WBC: 7.4 10*3/uL (ref 4.5–10.5)

## 2009-03-09 ENCOUNTER — Ambulatory Visit: Payer: Self-pay | Admitting: Family Medicine

## 2009-03-09 LAB — CONVERTED CEMR LAB
Bilirubin Urine: NEGATIVE
Blood in Urine, dipstick: NEGATIVE
Glucose, Urine, Semiquant: NEGATIVE
Ketones, urine, test strip: NEGATIVE
Nitrite: NEGATIVE
Protein, U semiquant: NEGATIVE
Specific Gravity, Urine: <1.005
Urobilinogen, UA: 0.2
pH: 5.5

## 2009-03-10 ENCOUNTER — Telehealth: Payer: Self-pay | Admitting: Family Medicine

## 2009-03-17 ENCOUNTER — Telehealth: Payer: Self-pay | Admitting: Family Medicine

## 2009-03-22 ENCOUNTER — Telehealth: Payer: Self-pay | Admitting: Family Medicine

## 2009-03-30 ENCOUNTER — Ambulatory Visit: Payer: Self-pay | Admitting: Family Medicine

## 2009-03-30 DIAGNOSIS — I10 Essential (primary) hypertension: Secondary | ICD-10-CM | POA: Insufficient documentation

## 2009-04-14 ENCOUNTER — Telehealth: Payer: Self-pay | Admitting: Family Medicine

## 2009-04-22 ENCOUNTER — Ambulatory Visit: Payer: Self-pay | Admitting: Family Medicine

## 2009-04-22 DIAGNOSIS — M51379 Other intervertebral disc degeneration, lumbosacral region without mention of lumbar back pain or lower extremity pain: Secondary | ICD-10-CM | POA: Insufficient documentation

## 2009-04-22 DIAGNOSIS — M5137 Other intervertebral disc degeneration, lumbosacral region: Secondary | ICD-10-CM | POA: Insufficient documentation

## 2009-04-27 ENCOUNTER — Ambulatory Visit: Payer: Self-pay | Admitting: Family Medicine

## 2009-04-27 LAB — CONVERTED CEMR LAB
Bilirubin Urine: NEGATIVE
Glucose, Urine, Semiquant: NEGATIVE
Ketones, urine, test strip: NEGATIVE
Nitrite: NEGATIVE
Specific Gravity, Urine: 1.01
Urobilinogen, UA: 0.2
pH: 5.5

## 2009-04-28 ENCOUNTER — Ambulatory Visit (HOSPITAL_COMMUNITY): Admission: RE | Admit: 2009-04-28 | Discharge: 2009-04-28 | Payer: Self-pay | Admitting: Family Medicine

## 2009-04-29 ENCOUNTER — Encounter: Payer: Self-pay | Admitting: Family Medicine

## 2009-04-29 ENCOUNTER — Telehealth: Payer: Self-pay | Admitting: Family Medicine

## 2009-05-04 ENCOUNTER — Ambulatory Visit: Payer: Self-pay | Admitting: Internal Medicine

## 2009-05-04 LAB — CONVERTED CEMR LAB
Basophils Absolute: 0 10*3/uL (ref 0.0–0.1)
Basophils Relative: 0.5 % (ref 0.0–3.0)
Eosinophils Absolute: 0.2 10*3/uL (ref 0.0–0.7)
Eosinophils Relative: 2 % (ref 0.0–5.0)
HCT: 32.4 % — ABNORMAL LOW (ref 36.0–46.0)
Hemoglobin: 10.9 g/dL — ABNORMAL LOW (ref 12.0–15.0)
Iron: 61 ug/dL (ref 42–145)
Lymphocytes Relative: 32.7 % (ref 12.0–46.0)
Lymphs Abs: 2.6 10*3/uL (ref 0.7–4.0)
MCHC: 33.7 g/dL (ref 30.0–36.0)
MCV: 99.4 fL (ref 78.0–100.0)
Monocytes Absolute: 0.7 10*3/uL (ref 0.1–1.0)
Monocytes Relative: 8.1 % (ref 3.0–12.0)
Neutro Abs: 4.6 10*3/uL (ref 1.4–7.7)
Neutrophils Relative %: 56.7 % (ref 43.0–77.0)
Platelets: 254 10*3/uL (ref 150.0–400.0)
RBC: 3.26 M/uL — ABNORMAL LOW (ref 3.87–5.11)
RDW: 13.2 % (ref 11.5–14.6)
Saturation Ratios: 22.9 % (ref 20.0–50.0)
Transferrin: 190 mg/dL — ABNORMAL LOW (ref 212.0–360.0)
WBC: 8.1 10*3/uL (ref 4.5–10.5)

## 2009-05-05 ENCOUNTER — Encounter: Payer: Self-pay | Admitting: Infectious Diseases

## 2009-05-05 ENCOUNTER — Ambulatory Visit: Payer: Self-pay | Admitting: Infectious Diseases

## 2009-05-05 DIAGNOSIS — M869 Osteomyelitis, unspecified: Secondary | ICD-10-CM | POA: Insufficient documentation

## 2009-05-05 LAB — CONVERTED CEMR LAB
Basophils Absolute: 0 10*3/uL (ref 0.0–0.1)
Basophils Absolute: 0 10*3/uL (ref 0.0–0.1)
Basophils Relative: 0.4 % (ref 0.0–3.0)
Basophils Relative: 0 % (ref 0–1)
CRP: 0.4 mg/dL (ref <0.6)
Eosinophils Absolute: 0.2 10*3/uL (ref 0.0–0.7)
Eosinophils Absolute: 0.2 10*3/uL (ref 0.0–0.7)
Eosinophils Relative: 3.4 % (ref 0.0–5.0)
Eosinophils Relative: 3 % (ref 0–5)
HCT: 32.9 % — ABNORMAL LOW (ref 36.0–46.0)
HCT: 35.7 % — ABNORMAL LOW (ref 36.0–46.0)
Hemoglobin: 10.8 g/dL — ABNORMAL LOW (ref 12.0–15.0)
Hemoglobin: 11.2 g/dL — ABNORMAL LOW (ref 12.0–15.0)
Iron: 50 ug/dL (ref 42–145)
Lymphocytes Relative: 32.2 % (ref 12.0–46.0)
Lymphocytes Relative: 21 % (ref 12–46)
Lymphs Abs: 2.3 10*3/uL (ref 0.7–4.0)
Lymphs Abs: 1.8 10*3/uL (ref 0.7–4.0)
MCHC: 32.9 g/dL (ref 30.0–36.0)
MCHC: 31.4 g/dL (ref 30.0–36.0)
MCV: 98.7 fL (ref 78.0–100.0)
MCV: 97.5 fL (ref >=78.0)
Monocytes Absolute: 0.7 10*3/uL (ref 0.1–1.0)
Monocytes Absolute: 0.7 10*3/uL (ref 0.1–1.0)
Monocytes Relative: 10 % (ref 3.0–12.0)
Monocytes Relative: 8 % (ref 3–12)
Neutro Abs: 4 10*3/uL (ref 1.4–7.7)
Neutro Abs: 6.1 10*3/uL (ref 1.7–7.7)
Neutrophils Relative %: 54 % (ref 43.0–77.0)
Neutrophils Relative %: 69 % (ref 43–77)
Platelets: 251 10*3/uL (ref 150.0–400.0)
Platelets: 292 10*3/uL (ref 150–400)
RBC: 3.33 M/uL — ABNORMAL LOW (ref 3.87–5.11)
RBC: 3.66 M/uL — ABNORMAL LOW (ref 3.87–5.11)
RDW: 13 % (ref 11.5–14.6)
RDW: 14 % (ref 11.5–15.5)
Saturation Ratios: 20.7 % (ref 20.0–50.0)
Sed Rate: 21 mm/hr (ref 0–22)
Transferrin: 172.9 mg/dL — ABNORMAL LOW (ref 212.0–360.0)
WBC: 7.2 10*3/uL (ref 4.5–10.5)
WBC: 8.9 10*3/uL (ref 4.0–10.5)

## 2009-05-06 ENCOUNTER — Encounter: Payer: Self-pay | Admitting: Family Medicine

## 2009-05-11 ENCOUNTER — Telehealth: Payer: Self-pay | Admitting: Family Medicine

## 2009-05-13 ENCOUNTER — Ambulatory Visit (HOSPITAL_COMMUNITY): Admission: RE | Admit: 2009-05-13 | Discharge: 2009-05-13 | Payer: Self-pay | Admitting: Neurosurgery

## 2009-05-13 ENCOUNTER — Encounter: Payer: Self-pay | Admitting: Family Medicine

## 2009-05-18 ENCOUNTER — Ambulatory Visit: Payer: Self-pay | Admitting: Infectious Diseases

## 2009-05-20 ENCOUNTER — Telehealth: Payer: Self-pay | Admitting: Infectious Diseases

## 2009-05-20 ENCOUNTER — Telehealth: Payer: Self-pay | Admitting: Family Medicine

## 2009-05-21 ENCOUNTER — Ambulatory Visit (HOSPITAL_COMMUNITY): Admission: RE | Admit: 2009-05-21 | Discharge: 2009-05-21 | Payer: Self-pay | Admitting: Infectious Diseases

## 2009-05-27 ENCOUNTER — Encounter: Payer: Self-pay | Admitting: Infectious Diseases

## 2009-05-28 ENCOUNTER — Telehealth: Payer: Self-pay | Admitting: Family Medicine

## 2009-06-01 ENCOUNTER — Encounter: Payer: Self-pay | Admitting: Infectious Diseases

## 2009-06-04 ENCOUNTER — Encounter: Payer: Self-pay | Admitting: Infectious Diseases

## 2009-06-10 ENCOUNTER — Telehealth: Payer: Self-pay | Admitting: Family Medicine

## 2009-06-21 ENCOUNTER — Telehealth: Payer: Self-pay | Admitting: Infectious Diseases

## 2009-06-22 ENCOUNTER — Encounter: Payer: Self-pay | Admitting: Infectious Diseases

## 2009-06-28 ENCOUNTER — Telehealth: Payer: Self-pay | Admitting: Infectious Diseases

## 2009-07-01 ENCOUNTER — Ambulatory Visit (HOSPITAL_COMMUNITY): Admission: RE | Admit: 2009-07-01 | Discharge: 2009-07-01 | Payer: Self-pay | Admitting: Infectious Diseases

## 2009-07-02 ENCOUNTER — Ambulatory Visit: Payer: Self-pay | Admitting: Infectious Diseases

## 2009-07-05 ENCOUNTER — Telehealth: Payer: Self-pay | Admitting: Internal Medicine

## 2009-07-06 ENCOUNTER — Ambulatory Visit: Payer: Self-pay | Admitting: Internal Medicine

## 2009-07-08 LAB — CONVERTED CEMR LAB
Iron: 48 ug/dL (ref 42–145)
Saturation Ratios: 17.9 % — ABNORMAL LOW (ref 20.0–50.0)
Transferrin: 191.5 mg/dL — ABNORMAL LOW (ref 212.0–360.0)
Vitamin B-12: 273 pg/mL (ref 211–911)

## 2009-07-12 ENCOUNTER — Encounter: Payer: Self-pay | Admitting: Infectious Diseases

## 2009-07-25 ENCOUNTER — Emergency Department (HOSPITAL_COMMUNITY): Admission: EM | Admit: 2009-07-25 | Discharge: 2009-07-25 | Payer: Self-pay | Admitting: Emergency Medicine

## 2009-07-29 ENCOUNTER — Telehealth: Payer: Self-pay

## 2009-07-29 ENCOUNTER — Encounter: Payer: Self-pay | Admitting: Infectious Diseases

## 2009-07-30 ENCOUNTER — Ambulatory Visit (HOSPITAL_COMMUNITY): Admission: RE | Admit: 2009-07-30 | Discharge: 2009-07-30 | Payer: Self-pay | Admitting: Infectious Diseases

## 2009-08-02 ENCOUNTER — Ambulatory Visit: Payer: Self-pay | Admitting: Infectious Diseases

## 2009-08-02 ENCOUNTER — Telehealth: Payer: Self-pay | Admitting: Infectious Diseases

## 2009-08-02 DIAGNOSIS — B379 Candidiasis, unspecified: Secondary | ICD-10-CM | POA: Insufficient documentation

## 2009-08-10 ENCOUNTER — Encounter: Payer: Self-pay | Admitting: Infectious Diseases

## 2009-08-12 ENCOUNTER — Telehealth: Payer: Self-pay | Admitting: Infectious Diseases

## 2009-08-30 ENCOUNTER — Encounter: Payer: Self-pay | Admitting: Family Medicine

## 2009-08-30 LAB — HM MAMMOGRAPHY

## 2009-09-01 ENCOUNTER — Encounter: Payer: Self-pay | Admitting: Family Medicine

## 2009-09-09 ENCOUNTER — Ambulatory Visit: Payer: Self-pay | Admitting: Internal Medicine

## 2009-09-10 LAB — CONVERTED CEMR LAB
Basophils Absolute: 0 10*3/uL (ref 0.0–0.1)
Basophils Relative: 0.4 % (ref 0.0–3.0)
Eosinophils Absolute: 0.2 10*3/uL (ref 0.0–0.7)
Eosinophils Relative: 2.9 % (ref 0.0–5.0)
HCT: 35 % — ABNORMAL LOW (ref 36.0–46.0)
Hemoglobin: 12 g/dL (ref 12.0–15.0)
Iron: 55 ug/dL (ref 42–145)
Lymphocytes Relative: 31.1 % (ref 12.0–46.0)
Lymphs Abs: 2.1 10*3/uL (ref 0.7–4.0)
MCHC: 34.4 g/dL (ref 30.0–36.0)
MCV: 95.1 fL (ref 78.0–100.0)
Monocytes Absolute: 0.7 10*3/uL (ref 0.1–1.0)
Monocytes Relative: 10.2 % (ref 3.0–12.0)
Neutro Abs: 3.8 10*3/uL (ref 1.4–7.7)
Neutrophils Relative %: 55.4 % (ref 43.0–77.0)
Platelets: 263 10*3/uL (ref 150.0–400.0)
RBC: 3.68 M/uL — ABNORMAL LOW (ref 3.87–5.11)
RDW: 14.4 % (ref 11.5–14.6)
Saturation Ratios: 20.9 % (ref 20.0–50.0)
Transferrin: 188 mg/dL — ABNORMAL LOW (ref 212.0–360.0)
Vitamin B-12: 311 pg/mL (ref 211–911)
WBC: 6.8 10*3/uL (ref 4.5–10.5)

## 2009-11-19 ENCOUNTER — Telehealth (INDEPENDENT_AMBULATORY_CARE_PROVIDER_SITE_OTHER): Payer: Self-pay | Admitting: *Deleted

## 2009-12-16 ENCOUNTER — Telehealth: Payer: Self-pay | Admitting: Internal Medicine

## 2009-12-21 ENCOUNTER — Ambulatory Visit: Payer: Self-pay | Admitting: Internal Medicine

## 2009-12-23 LAB — CONVERTED CEMR LAB
Basophils Absolute: 0 10*3/uL (ref 0.0–0.1)
Basophils Relative: 0.5 % (ref 0.0–3.0)
Eosinophils Absolute: 0.2 10*3/uL (ref 0.0–0.7)
Eosinophils Relative: 3.5 % (ref 0.0–5.0)
HCT: 32.5 % — ABNORMAL LOW (ref 36.0–46.0)
Hemoglobin: 11.3 g/dL — ABNORMAL LOW (ref 12.0–15.0)
Iron: 71 ug/dL (ref 42–145)
Lymphocytes Relative: 34.6 % (ref 12.0–46.0)
Lymphs Abs: 2.3 10*3/uL (ref 0.7–4.0)
MCHC: 34.8 g/dL (ref 30.0–36.0)
MCV: 96.1 fL (ref 78.0–100.0)
Monocytes Absolute: 0.6 10*3/uL (ref 0.1–1.0)
Monocytes Relative: 9.8 % (ref 3.0–12.0)
Neutro Abs: 3.4 10*3/uL (ref 1.4–7.7)
Neutrophils Relative %: 51.6 % (ref 43.0–77.0)
Platelets: 250 10*3/uL (ref 150.0–400.0)
RBC: 3.38 M/uL — ABNORMAL LOW (ref 3.87–5.11)
RDW: 13.4 % (ref 11.5–14.6)
Saturation Ratios: 24.5 % (ref 20.0–50.0)
Transferrin: 207.3 mg/dL — ABNORMAL LOW (ref 212.0–360.0)
Vitamin B-12: 288 pg/mL (ref 211–911)
WBC: 6.6 10*3/uL (ref 4.5–10.5)

## 2009-12-28 ENCOUNTER — Telehealth: Payer: Self-pay | Admitting: Internal Medicine

## 2009-12-29 ENCOUNTER — Telehealth (INDEPENDENT_AMBULATORY_CARE_PROVIDER_SITE_OTHER): Payer: Self-pay | Admitting: *Deleted

## 2009-12-31 ENCOUNTER — Telehealth (INDEPENDENT_AMBULATORY_CARE_PROVIDER_SITE_OTHER): Payer: Self-pay | Admitting: *Deleted

## 2010-01-10 ENCOUNTER — Encounter: Payer: Self-pay | Admitting: Family Medicine

## 2010-02-03 ENCOUNTER — Ambulatory Visit: Payer: Self-pay | Admitting: Family Medicine

## 2010-02-03 DIAGNOSIS — J209 Acute bronchitis, unspecified: Secondary | ICD-10-CM | POA: Insufficient documentation

## 2010-02-03 LAB — CONVERTED CEMR LAB
Bilirubin Urine: NEGATIVE
Cholesterol, target level: 200 mg/dL
Glucose, Urine, Semiquant: NEGATIVE
HDL goal, serum: 40 mg/dL
Ketones, urine, test strip: NEGATIVE
LDL Goal: 160 mg/dL
Nitrite: NEGATIVE
Protein, U semiquant: NEGATIVE
Specific Gravity, Urine: <1.005
Urobilinogen, UA: 0.2

## 2010-02-04 ENCOUNTER — Telehealth (INDEPENDENT_AMBULATORY_CARE_PROVIDER_SITE_OTHER): Payer: Self-pay | Admitting: *Deleted

## 2010-03-07 ENCOUNTER — Telehealth: Payer: Self-pay | Admitting: Internal Medicine

## 2010-03-22 LAB — CONVERTED CEMR LAB
Basophils Absolute: 0 10*3/uL (ref 0.0–0.1)
Basophils Relative: 0.5 % (ref 0.0–3.0)
CO2: 28 meq/L (ref 19–32)
Chloride: 102 meq/L (ref 96–112)
Eosinophils Absolute: 0.3 10*3/uL (ref 0.0–0.7)
Eosinophils Relative: 4.1 % (ref 0.0–5.0)
HCT: 33.4 % — ABNORMAL LOW (ref 36.0–46.0)
Hemoglobin: 11.4 g/dL — ABNORMAL LOW (ref 12.0–15.0)
Iron: 52 ug/dL (ref 42–145)
Lymphocytes Relative: 31 % (ref 12.0–46.0)
Lymphs Abs: 2.4 10*3/uL (ref 0.7–4.0)
MCHC: 34.1 g/dL (ref 30.0–36.0)
MCV: 97.2 fL (ref 78.0–100.0)
Monocytes Absolute: 0.7 10*3/uL (ref 0.1–1.0)
Monocytes Relative: 8.5 % (ref 3.0–12.0)
Neutro Abs: 4.4 10*3/uL (ref 1.4–7.7)
Neutrophils Relative %: 55.9 % (ref 43.0–77.0)
Platelets: 275 10*3/uL (ref 150.0–400.0)
Potassium: 3.9 meq/L (ref 3.5–5.1)
RBC: 3.44 M/uL — ABNORMAL LOW (ref 3.87–5.11)
RDW: 13.7 % (ref 11.5–14.6)
Saturation Ratios: 17.8 % — ABNORMAL LOW (ref 20.0–50.0)
Sodium: 137 meq/L (ref 135–145)
Transferrin: 208.6 mg/dL — ABNORMAL LOW (ref 212.0–360.0)
Vitamin B-12: >1500 pg/mL — ABNORMAL HIGH (ref 211–911)
WBC: 7.8 10*3/uL (ref 4.5–10.5)

## 2010-03-24 ENCOUNTER — Ambulatory Visit: Payer: Self-pay | Admitting: Internal Medicine

## 2010-04-26 NOTE — Assessment & Plan Note (Signed)
Summary: emp/pt req ekg/cjr   Vital Signs:  Patient profile:   77 year old female Height:      65 inches Weight:      205 pounds BMI:     34.24 O2 Sat:      98 % Temp:     97.8 degrees F BP sitting:   120 / 80  (left arm) Cuff size:   large  Vitals Entered By: Pura Spice, RN (March 30, 2009 2:07 PM) CC: go over problems Refill meds [facility]  Is Patient Diabetic? No Pain Assessment Patient in pain? no        History of Present Illness: this 22-year-old white female is in to discuss her problems and refill her medications. This patient had bariatric surgery years ago and it caused some gastrointestinal problems with recurrent diarrhea and also some electrolyte imbalance at times. She is under care of Dr. Layla Barter a gastroenterologist or frequent visit normal has problem with anemia which Dr. Dickie La history. She has some problem with recurrent urinary tract infections, also nasal congestion caused by allergies. She had a stress test 10 years ago and was negative at that time and recent EKG revealed no acute problems Dr. Senaida Ores gynecologist takes care of 2 of her Pap smear and pelvic examination. Patient started smoking at age 18 however quit 30 years ago. She has chronic history of low back pain but does function very well Hypertension well controlled Patient has peripheral neuropathy which is controlled Lyrica  Preventive Screening-Counseling & Management  Alcohol-Tobacco     Smoking Status: quit > 6 months     Packs/Day: <0.25     Year Started: 1950     Year Quit: 1980  Allergies: 1)  ! Demerol 2)  ! Hydrocodone 3)  ! Ciprofloxacin Hcl (Ciprofloxacin Hcl) 4)  Codeine Phosphate (Codeine Phosphate) 5)  Hydrocodone-Homatropine (Hydrocodone-Homatropine) 6)  Sulfamethoxazole (Sulfamethoxazole) 7)  Zithromax (Azithromycin)  Past History:  Past Medical History: Last updated: 07/09/2008 Asthma Obesity DIVERTICULOSIS, COLON (ICD-562.10) Hx of  ESOPHAGEAL STRICTURE (ICD-530.3) VITAMIN B12 DEFICIENCY (ICD-266.2) COLONIC POLYPS, ADENOMATOUS, HX OF (ICD-V12.72) ANEMIA (ICD-285.9) ACUTE PHARYNGITIS (ICD-462) LARYNGITIS, ACUTE (ICD-464.00) NUMBNESS (ICD-782.0) UTI (ICD-599.0) ENTERITIS (ICD-558.9) URINARY URGENCY (ICD-788.63) BACK PAIN (ICD-724.5) ANXIETY (ICD-300.00) BARIATRIC SURGERY STATUS (ICD-V45.86) FATIGUE (ICD-780.79) INSOMNIA (ICD-780.52) ANEMIA, CHRONIC (ICD-281.9) CYSTITIS, ACUTE (ICD-595.0) BACK PAIN (ICD-724.5) GASTROINTESTINAL HEMORRHAGE, HX OF (ICD-V12.79) ASTHMA (ICD-493.90) ALLERGIC RHINITIS (ICD-477.9)  Past Surgical History: Last updated: 07/09/2008 Cholecystectomy [address] Bypass  Tonsillectomy Back surgery-ruptured disk  Social History: Last updated: 08/04/2008 Illicit Drug Use - no Patient has never smoked.  Alcohol Use - no Daily Caffeine Use  diet coke  and mt dew some coffee Occupation: [employer]  Risk Factors: Smoking Status: quit > 6 months (03/30/2009) Packs/Day: <0.25 (03/30/2009)  Past History:  Care Management: Gastroenterology:  Dr [name]  Gynecology:  Dr Senaida Ores   Social History: Smoking Status:  quit > 6 months Packs/Day:  <0.25  Review of Systems      See HPI  The patient denies anorexia, fever, weight loss, weight gain, vision loss, decreased hearing, hoarseness, chest pain, syncope, dyspnea on exertion, peripheral edema, prolonged cough, headaches, hemoptysis, abdominal pain, melena, hematochezia, severe indigestion/heartburn, hematuria, incontinence, genital sores, muscle weakness, suspicious skin lesions, transient blindness, difficulty walking, depression, unusual weight change, abnormal bleeding, enlarged lymph nodes, angioedema, breast masses, and testicular masses.    Physical Exam  General:  Well-developed,well-nourished,in no acute distress; alert,appropriate and cooperative throughout examinationoverweight-appearing.   Head:  Normocephalic and  atraumatic without obvious abnormalities. No apparent alopecia or balding. Eyes:  No corneal or conjunctival inflammation noted. EOMI. Perrla. Funduscopic exam benign, without hemorrhages, exudates or papilledema. Vision grossly normal. Ears:  External ear exam shows no significant lesions or deformities.  Otoscopic examination reveals clear canals, tympanic membranes are intact bilaterally without bulging, retraction, inflammation or discharge. Hearing is grossly normal bilaterally. Nose:  moderate nasal congestion with clear drainage Mouth:  Oral mucosa and oropharynx without lesions or exudates.  Teeth in good repair. Neck:  No deformities, masses, or tenderness noted. Chest Wall:  No deformities, masses, or tenderness noted. Breasts:  No mass, nodules, thickening, tenderness, bulging, retraction, inflamation, nipple discharge or skin changes noted.   Lungs:  Normal respiratory effort, chest expands symmetrically. Lungs are clear to auscultation, no crackles or wheezes. Heart:  Normal rate and regular rhythm. S1 and S2 normal without gallop, murmur, click, rub or other extra sounds. Abdomen:  cholecystectomy scar in bariatric surgery forno rigidity, no rebound tenderness, no abdominal hernia, no inguinal hernia, no hepatomegaly, and no splenomegaly.   Rectal:  not examined Genitalia:  on exam Msk:  No deformity or scoliosis noted of thoracic or lumbar spine.   Pulses:  R and L carotid,radial,femoral,dorsalis pedis and posterior tibial pulses are full and equal bilaterally Extremities:  No clubbing, cyanosis, edema, or deformity noted with normal full range of motion of all joints.   Neurologic:  No cranial nerve deficits noted. Station and gait are normal. Plantar reflexes are down-going bilaterally. DTRs are symmetrical throughout. Sensory, motor and coordinative functions appear intact. Skin:  Intact without suspicious lesions or rashes Cervical Nodes:  No lymphadenopathy noted Axillary  Nodes:  No palpable lymphadenopathy Inguinal Nodes:  No significant adenopathy Psych:  Cognition and judgment appear intact. Alert and cooperative with normal attention span and concentration. No apparent delusions, illusions, hallucinations   Impression & Recommendations:  Problem # 1:  HYPERTENSION (ICD-401.9) Assessment Improved  Her updated medication list for this problem includes:    Maxzide-25 37.5-25 Mg Tabs (Triamterene-hctz) .Marland Kitchen... 1 once daily as needed for edema  Orders: EKG w/ Interpretation (93000) Prescription Created Electronically [phone])  Problem # 2:  Hx of ESOPHAGEAL STRICTURE (ICD-530.3) Assessment: Improved  Problem # 3:  VITAMIN B12 DEFICIENCY (ICD-266.2) Assessment: Improved  Problem # 4:  NUMBNESS (ICD-782.0) Assessment: Improved  Problem # 5:  BACK PAIN (ICD-724.5) Assessment: Unchanged  Her updated medication list for this problem includes:    Flexeril 10 Mg Tabs (Cyclobenzaprine hcl) .Marland Kitchen... 1 three times a day for muscle spasm    Nabumetone 750 Mg Tabs (Nabumetone) .Marland Kitchen... 1 by mouth two times a day  Problem # 6:  ANEMIA (ICD-285.9) Assessment: Improved  Her updated medication list for this problem includes:    Cyanocobalamin 1000 Mcg/ml Inj Soln (Cyanocobalamin) ..... Inject 1 ml im once a week for 4 weeks then inject 1 ml once a month  Problem # 7:  BARIATRIC SURGERY STATUS (ICD-V45.86) Assessment: Unchanged  Problem # 8:  ASTHMA (ICD-493.90) Assessment: Improved  Her updated medication list for this problem includes:    Advair Diskus 250-50 Mcg/dose Misc (Fluticasone-salmeterol) ..... Uses as needed    Prednisone 10 Mg Tabs (Prednisone) .Marland Kitchen... Take 3 (30mg ) by mouth the day before iron infusion.  Complete Medication List: 1)  Fexofenadine Hcl 180 Mg Tabs (Fexofenadine hcl) .... Take 1 tablet by mouth once a day 2)  Flonase 50 Mcg/act Susp (Fluticasone propionate) .... Spray 1 spray into both nostrils once a day 3)  Bl  Vitamin E 400 Unit  Caps (Vitamin e) .... Once daily 4)  Advair Diskus 250-50 Mcg/dose Misc (Fluticasone-salmeterol) .... Uses as needed 5)  Omeprazole 20 Mg Tbec (Omeprazole) .Marland Kitchen.. 1 by mouth once daily 6)  Valtrex 500 Mg Tabs (Valacyclovir hcl) .... Prn 7)  Amitiza 24 Mcg Caps (Lubiprostone) .... Once daily 8)  Mag-ox 400 400 Mg Tabs (Magnesium oxide) .... Two once daily 9)  Ambien 10 Mg Tabs (Zolpidem tartrate) .... On by mouth q hs as needed 10)  Amitriptyline Hcl 100 Mg Tabs (Amitriptyline hcl) .... Take 1 at bedtime 11)  Potassium Chloride Crys Cr 20 Meq Tbcr (Potassium chloride crys cr) .Marland Kitchen.. 1 qd 12)  Pyridium 200 Mg Tabs (Phenazopyridine hcl) .Marland Kitchen.. 1 three times a day 13)  Maxzide-25 37.5-25 Mg Tabs (Triamterene-hctz) .Marland Kitchen.. 1 once daily as needed for edema 14)  Hydroxyzine Hcl 25 Mg Tabs (Hydroxyzine hcl) .Marland Kitchen.. 1 by mouth three times a day as needed itching 15)  Hemocyte Plus 106-1 Mg Caps (B complex-c-min-fe-fa) .... Take 1 by mouth two times a day 16)  Cyanocobalamin 1000 Mcg/ml Inj Soln (Cyanocobalamin) .... Inject 1 ml im once a week for 4 weeks then inject 1 ml once a month 17)  Flexeril 10 Mg Tabs (Cyclobenzaprine hcl) .Marland Kitchen.. 1 three times a day for muscle spasm 18)  Alprazolam 1 Mg Tabs (Alprazolam) .Marland Kitchen.. 1 three times a day as needed stress needs office visit 19)  Optivar 0.05 % Soln (Azelastine hcl) .Marland Kitchen.. 1 drop  each  eye bid 20)  Lyrica 50 Mg Caps (Pregabalin) .Marland Kitchen.. 1 two times a day 21)  Prednisone 10 Mg Tabs (Prednisone) .... Take 3 (30mg ) by mouth the day before iron infusion. 22)  Klor-con M20 20 Meq Cr-tabs (Potassium chloride crys cr) .Marland Kitchen.. 1 by mouth once daily  needs office visit 23)  Bentyl 20 Mg Tabs (Dicyclomine hcl) .... Take one by mouth 3 times daily as needed 24)  Levaquin 750 Mg Tabs (Levofloxacin) .Marland Kitchen.. 1 once daily for infection 25)  Nabumetone 750 Mg Tabs (Nabumetone) .Marland Kitchen.. 1 by mouth two times a day  Patient Instructions: 1)  Medical problems and we will control but you need to  continue taking your medications. Controlled her anxiety and stress with alprazolam as prescribed 2)  We'll call lab results 3)  EKG reveals no acute problems nor stress Prescriptions: FLONASE 50 MCG/ACT SUSP (FLUTICASONE PROPIONATE) Spray 1 spray into both nostrils once a day  #1 bottle x 11   Entered and Authorized by:   Judithann Sheen MD   Signed by:   Judithann Sheen MD on 03/30/2009   Method used:   Electronically to        [id number]* (retail)       [address]       [place], [place]  [address]       Ph: [phone] or [phone]       Fax: [phone]   RxID:   [id number] ALPRAZOLAM 1 MG TABS (ALPRAZOLAM) 1 three times a day as needed stress needs office visit  #90 x 5   Entered and Authorized by:   Judithann Sheen MD   Signed by:   Judithann Sheen MD on 03/30/2009   Method used:   Print then Give to Patient   RxID:   [id number] LEVAQUIN 750 MG TABS (LEVOFLOXACIN) 1 once daily for infection  #7 x 0  Entered and Authorized by:   Judithann Sheen MD   Signed by:   Judithann Sheen MD on 03/30/2009   Method used:   Electronically to        [id number]* (retail)       [address]       [place], [place]  [address]       Ph: [phone] or [phone]       Fax: [phone]   RxID:   [id number]   Preventive Care Screening     sees Dr Senaida Ores GYN

## 2010-04-26 NOTE — Op Note (Signed)
Summary: Fluoroscipic Guided L1-2 Disc Aspiration  Fluoroscipic Guided L1-2 Disc Aspiration   Imported By: Maryln Gottron 07/01/2009 14:11:16  _____________________________________________________________________  External Attachment:    Type:   Image     Comment:   External Document

## 2010-04-26 NOTE — Progress Notes (Signed)
Summary: refill amitriptyline  w 3 refills need ov in august  Phone Note From Pharmacy   Caller: [facility] Reason for Call: Needs renewal Summary of Call: refill amitriptryline  Initial call taken by: Pura Spice, RN,  Jul 30, 2007 9:07 AM    New/Updated Medications: AMITRIPTYLINE HCL 100 MG  TABS (AMITRIPTYLINE HCL) take 1 at bedtime no more refills til seen   Prescriptions: AMITRIPTYLINE HCL 100 MG  TABS (AMITRIPTYLINE HCL) take 1 at bedtime no more refills til seen  #30 x 3   Entered by:   Pura Spice, RN   Authorized by:   Judithann Sheen MD   Signed by:   Pura Spice, RN on 07/30/2007   Method used:   Printed then faxed to ...       [facility]  [address] [id number]       [address]       [place], [place]  [address]       Ph: [phone] or [phone]       Fax: [phone]   RxID:   [id number]

## 2010-04-26 NOTE — Progress Notes (Signed)
Summary: pt requesting lab results  Phone Note Call from Patient Call back at Work Phone [phone]   Caller: Patient Call For: Judithann Sheen MD Summary of Call: Pt called and wants to know her lab results from blood work. Please call her back asap.  Initial call taken by: Lucy Antigua,  November 05, 2008 12:50 PM  Follow-up for Phone Call        dr Scotty Court not signed off on her labs and will be next friday before she could get results  pls let her know  thanks Follow-up by: Pura Spice, RN,  November 05, 2008 2:29 PM  Additional Follow-up for Phone Call Additional follow up Details #1::        Patient is angry and is asking for hemoglobin results. Told patient there was no hemoglobin done the day she was here in the ofice. Patient states that all she needed done was her hemoglobin. Patient states she is not going to pay for the BMP because she did not need that.  Additional Follow-up by: Darra Lis RMA,  November 05, 2008 3:19 PM    Additional Follow-up for Phone Call Additional follow up Details #2::    Chales Abrahams aware  Follow-up by: Pura Spice, RN,  November 12, 2008 9:48 AM

## 2010-04-26 NOTE — Progress Notes (Signed)
Summary: Appt. 07/01/09 @ [facility]. 1200  Phone Note Call from Patient Call back at Work Phone [phone]   Caller: Patient Call For: Johny Sax, MD Reason for Call: Refill Medication, Talk to Doctor Summary of Call: Message left by pt.  Pt. currently taking IV Vanco and Rocephin via PIC.  Has appt. coming up 07/02/2009 @ 1030.  Wanted to know whether she needs an MRI to check on the progress of the ABXes prior to her visit.  She is hoping to have the [facility] removed if progress is satisfactory.  Please advise. Jennet Maduro RN  June 21, 2009 4:52 PM   Follow-up for Phone Call        Pt. given appt. info. for f/u MRI on 07/01/09 @ 1200, [facility]. Jennet Maduro RN  July 01, 2009 9:18 AM

## 2010-04-26 NOTE — Letter (Signed)
Summary: [facility]  [facility]   Imported By: Maryln Gottron 07/03/2008 12:30:41  _____________________________________________________________________  External Attachment:    Type:   Image     Comment:   External Document

## 2010-04-26 NOTE — Progress Notes (Signed)
Summary: Medication  Phone Note Call from Patient Call back at Home Phone [phone]   Caller: Patient Call For: Dr. Juanda Chance Reason for Call: Talk to Nurse Summary of Call: Ins. needs prior auth on her Omeprazole.Marland KitchenMarland KitchenMarland KitchenMedco [phone] Initial call taken by: Karna Christmas,  December 16, 2009 4:30 PM  Follow-up for Phone Call        Advised patient that Dr Laurita Quint office has been sending her omeprazole therefore, they need to be doing the authorization. Patient states that Dr Charmian Muff office will not do prior authorization because she has not been seen in 1 year. I have advised her that I will send a few months prescription to her pharmacy for them to put under Dr Juanda Chance, but she needs labs and will need an office visit for further refills! Follow-up by: Lamona Curl CMA Duncan Dull),  December 16, 2009 4:40 PM    New/Updated Medications: OMEPRAZOLE 20 MG  TBEC (OMEPRAZOLE) 1 by mouth once daily. MUST HAVE OFFICE VISIT FOR FURTHER REFILLS! Prescriptions: OMEPRAZOLE 20 MG  TBEC (OMEPRAZOLE) 1 by mouth once daily. MUST HAVE OFFICE VISIT FOR FURTHER REFILLS!  #30 x 1   Entered by:   Lamona Curl CMA (AAMA)   Authorized by:   Hart Carwin MD   Signed by:   Lamona Curl CMA (AAMA) on 12/16/2009   Method used:   Electron[facility] to        [facility]  [address] [id number] ([facility]ail[facility]       [address]       [place], [place]  [address]       Ph: [phone] or [phone]       Fax: [phone]   RxID:   [id number]

## 2010-04-26 NOTE — Assessment & Plan Note (Signed)
Summary: F/U/OK PER DR HATCHER/VS   Referring Provider:  n/a Primary Provider:  London Sheer   History of Present Illness: 77 yo F with a hx of L2-L3 discectomy 1994. In 2009 she had "nerve injections" into her back due to back pain.  She has had worsening back pain recently (over last 3 and 1/2 weeks)  and underwent MRI (Apr 28, 2009) showing 1.  Significant marrow signal changes and abnormal enhancement about the L1-2 disc space, predominately in the inferior aspect of L1 with erosion into the inferior endplates.  This is most concerning for disc osteomyelitis. Reactive changes and a large Schmorl's node without infection are considered less likely. 2.  Abnormal soft tissue within the left lateral recess posterior to the L1 vertebral body.  This is most compatible with a small epidural abscess resulting in left lateral recess narrowing and potentially affecting the left L1 and L2 nerve roots.  Alternately, this could represent extruded disc material.  The latter explanation does not explain the peripheral enhancement. 3.  Abnormal enhancement of the paraspinal soft tissues about L1-2 also favors infection.   She underwent aspiration of L1-2 (2-17) no WBC, few SE, no organisms, Cx negative.  She was started on vancomycin and ceftriaxone.  She had f/u MRI (07-01-2009) 1.  Interval decrease in the extent of epidural enhancement posterior to L1.  A small nodular component is now present where the previous epidural abscess was suggested. 2.  Persistent endplate enhancement.  MRI findings can persist beyond the point where infection is clinically sterile. 3.  Decreased paravertebral and soft tissue enhancement at L1-2. This was reviewed with her and she cont her antibiotics an additional 4 weeks from 07-02-09.   She had f/u MRI on 07-30-09:1.  Continued improved appearance at L1.  Minimal residual enhancement but no epidural abscess, diskitis or osteomyelitis. 2.  Stable wide  decompressive laminectomy on the left at L2-3.  Has had her IV flushed with TPA due to slow infusion. Has improved significantly.    Current Allergies (reviewed today): ! DEMEROL ! HYDROCODONE ! CIPROFLOXACIN HCL (CIPROFLOXACIN HCL) CODEINE PHOSPHATE (CODEINE PHOSPHATE) HYDROCODONE-HOMATROPINE (HYDROCODONE-HOMATROPINE) ZITHROMAX (AZITHROMYCIN) Current Medications (verified): 1)  Fexofenadine Hcl 180 Mg Tabs (Fexofenadine Hcl) .... Take 1 Tablet By Mouth Once A Day 2)  Flonase 50 Mcg/act Susp (Fluticasone Propionate) .... Spray 1 Spray Into Both Nostrils Once A Day 3)  Bl Vitamin E 400 Unit  Caps (Vitamin E) .... Once Daily 4)  Advair Diskus 250-50 Mcg/dose  Misc (Fluticasone-Salmeterol) .... Uses As Needed 5)  Omeprazole 20 Mg  Tbec (Omeprazole) .Marland Kitchen.. 1 By Mouth Once Daily 6)  Valtrex 500 Mg  Tabs (Valacyclovir Hcl) .... Prn 7)  Amitiza 24 Mcg  Caps (Lubiprostone) .... Once Daily 8)  Mag-Ox 400 400 Mg  Tabs (Magnesium Oxide) .... Two Once Daily 9)  Ambien 10 Mg  Tabs (Zolpidem Tartrate) .... On By Mouth Q Hs As Needed 10)  Amitriptyline Hcl 100 Mg  Tabs (Amitriptyline Hcl) .... Take 1 At Bedtime 11)  Potassium Chloride Crys Cr 20 Meq  Tbcr (Potassium Chloride Crys Cr) .... 1/2 To 1 Tab Once Daily 12)  Hydroxyzine Hcl 25 Mg  Tabs (Hydroxyzine Hcl) .Marland Kitchen.. 1 By Mouth Three Times A Day As Needed Itching 13)  Hemocyte Plus 106-1 Mg  Caps (B Complex-C-Min-Fe-Fa) .... Take 1 By Mouth Two Times A Day 14)  Cyanocobalamin 1000 Mcg/ml Inj Soln (Cyanocobalamin) .... Inject 1 Ml Im Once A Week For 4 Weeks Then Inject 1 Ml  Once A Month 15)  Flexeril 10 Mg Tabs (Cyclobenzaprine Hcl) .Marland Kitchen.. 1 Three Times A Day For Muscle Spasm 16)  Optivar 0.05 % Soln (Azelastine Hcl) .Marland Kitchen.. 1 Drop  Each  Eye Bid 17)  Lyrica 50 Mg Caps (Pregabalin) .... Take 1 Tablet By Mouth At Bedtime 18)  Bentyl 20 Mg Tabs (Dicyclomine Hcl) .... Take One By Mouth 3 Times Daily As Needed 19)  Nabumetone 750 Mg Tabs (Nabumetone) .Marland Kitchen.. 1 By  Mouth Two Times A Day 20)  Vancomycin Hcl 1000 Mg Solr (Vancomycin Hcl) .... Dosed Per Home Health Pharmacy 21)  Ceftriaxone Sodium 1 Gm Solr (Ceftriaxone Sodium) .Marland Kitchen.. 1g Ivpb Once Daily  Allergies (verified): 1)  ! Demerol 2)  ! Hydrocodone 3)  ! Ciprofloxacin Hcl (Ciprofloxacin Hcl) 4)  Codeine Phosphate (Codeine Phosphate) 5)  Hydrocodone-Homatropine (Hydrocodone-Homatropine) 6)  Zithromax (Azithromycin)   Review of Systems       has been having yeast infections on her gluteal areas, has been using triamcinolone;  Vital Signs:  Patient profile:   77 year old female Height:      65 inches Temp:     98.2 degrees F oral Pulse rate:   88 / minute BP sitting:   116 / 78  Physical Exam  General:  well-developed, well-nourished, and well-hydrated.   Eyes:  pupils equal, pupils round, and pupils reactive to light.   Mouth:  pharynx pink and moist and no exudates.   Neck:  no masses.   Lungs:  normal respiratory effort and normal breath sounds.   Heart:  normal rate, regular rhythm, and no murmur.   Abdomen:  soft, non-tender, and normal bowel sounds.   Extremities:  LUE PIC is clean, non-tender, no erythema.  Additional Exam:  minimal tenderness over her LS area. no increase heat. no fluctuance   Impression & Recommendations:  Problem # 1:  OSTEOMYELITIS, VERTEBRA (ICD-730.28)  will stop her ceftriaxone and vanco. will pull her PIC line. her MRI looks significantly improved. she will return to clinic as needed.  The following medications were removed from the medication list:    Ceftriaxone Sodium 1 Gm Solr (Ceftriaxone sodium) .Marland Kitchen... 1g ivpb once daily  Orders: Est. Patient Level IV (04540)  Problem # 2:  ANXIETY (ICD-300.00) she contiues to be highly anxious about her medical issues and her care. will f/u with PMD.  Her updated medication list for this problem includes:    Amitriptyline Hcl 100 Mg Tabs (Amitriptyline hcl) .Marland Kitchen... Take 1 at bedtime    Hydroxyzine Hcl 25  Mg Tabs (Hydroxyzine hcl) .Marland Kitchen... 1 by mouth three times a day as needed itching  Problem # 3:  CANDIDIASIS OF UNSPECIFIED SITE (ICD-112.9)  asked her to stop using the triamcinolone as this will make this worse. given rx for topical anitfungal.   Orders: Est. Patient Level IV (98119)  Medications Added to Medication List This Visit: 1)  Mycelex Otc 1 % Crea (Clotrimazole) .... Two times a day for 7 days Prescriptions: MYCELEX OTC 1 % CREA (CLOTRIMAZOLE) two times a day for 7 days  #5g x 1   Entered and Authorized by:   Johny Sax MD   Signed by:   Johny Sax MD on 08/02/2009   Method used:   Print then Give to Patient   RxID:   [id number]   Appended Document: F/U/OK PER DR HATCHER/VS Order to remove PICC line received from Dr. Ninetta Lights.  Pt. identified with name and date of birth. PICC dressing removed, site unremarkable.  Sutures attached to PICC line cut and removed.  PICC line removed using sterile procedure @ 1230 pm.   PICC length equal to that noted in pt's hospital chart of 38 cm.  Sterile petroleum gauze + sterile 4X4 applied to PICC site, pressure applied for 10 minutes and covered with Medipore take as a pressure dressing.  Pt. instructed to limit use of arm for 1 hour.  Pt. instructed that the pressure dressing should remain in place for 24 hours.  Pt. verablized understanding of these instructions.   Jennet Maduro, RN [phone]

## 2010-04-26 NOTE — Progress Notes (Signed)
Summary: Pt requesting MRI   Phone Note Call from Patient   Summary of Call: Pt called three days ago,  she has not heard from our office.   She is due to end IV Antibiotics on 07-02-08-11. She is insisting that a repeat MRI be done prior to her PICC removal.  I will speak with Dr Ninetta Lights and return call to patient. Tomasita Morrow RN  June 28, 2009 12:07 PM   Follow-up for Phone Call        Per Dr Ninetta Lights ok for MRI . His CMA will sched. the appt and call the patient. Tomasita Morrow RN  June 28, 2009 12:11 PM  I will inform the patient to expect our phone call.   Tomasita Morrow RN  June 28, 2009 12:11 PM   Additional Follow-up for Phone Call Additional follow up Details #1::        Pt called and requesting her biopsy results. please call her at [phone] Additional Follow-up by: Warnell Forester,  June 29, 2009 12:33 PM    Additional Follow-up for Phone Call Additional follow up Details #2::    Pt. called with appt. for MRI @ [facility] for 07/01/09 @ 1200.  Pt. to arrive for registration @ 1130.  Pt. verbalized understanding. Jennet Maduro RN  July 01, 2009 9:17 AM

## 2010-04-26 NOTE — Progress Notes (Signed)
Summary: alprazolam 0.5mg  with 5 refill   Phone Note From Pharmacy   Caller: Patient Caller: [facility]  [address] # [id number]* Call For: stafford  Reason for Call: Needs renewal Summary of Call: alprazolam  refill   Initial call taken by: Pura Spice, RN,  January 30, 2007 2:46 PM    New/Updated Medications: ALPRAZOLAM 0.5 MG TABS (ALPRAZOLAM) pt takes one tab at bedtime refill sch on time only   Prescriptions: ALPRAZOLAM 0.5 MG TABS (ALPRAZOLAM) pt takes one tab at bedtime refill sch on time only  #34 x 5   Entered by:   Pura Spice, RN   Authorized by:   Judithann Sheen MD   Signed by:   Pura Spice, RN on 01/30/2007   Method used:   Telephoned to ...       [facility]  [address] # [id number]*       [address]       [place], [place]  [address]       Ph: [phone] or [phone]       Fax: [phone]   RxID:   [id number]

## 2010-04-26 NOTE — Assessment & Plan Note (Signed)
Summary: BACK PAIN//CCM   Vital Signs:  Patient profile:   77 year old female Temp:     97.8 degrees F oral Pulse rate:   92 / minute BP sitting:   124 / 78  (left arm) Cuff size:   large  Vitals Entered By: Alfred Levins, CMA (April 22, 2009 4:29 PM)  Contraindications/Deferment of Procedures/Staging:    Test/Procedure: Weight Refused    Reason for deferment: patient declined-cannot calculate BMI  CC: back pain   History of Present Illness: this 77 year old white female is complaining of back pain over the lumbosacral spine bilaterally. Is somewhat different than the pain she had previously does have some radiation of pain down to the left she has been taken tramadol for pain or Darvocet and in addition has been taking Relafen 750 mg b.i.d. as well as Flexeril for muscle spasm. Has been gradual in its onset but increased in severity and concerned about the possibility of a new herniated disc. Dr. Channing Mutters did a laminectomy on patient sometime ago and 2 years ago had similar problems and treated by Dr. Jeral Fruit which resulted in an epidural injection Blood her sugars been well controlled Patient has had recurrent UTIs but has no symptoms at this time Continued to have problems of late control despite the gastric bypass  Current Medications (verified): 1)  Fexofenadine Hcl 180 Mg Tabs (Fexofenadine Hcl) .... Take 1 Tablet By Mouth Once A Day 2)  Flonase 50 Mcg/act Susp (Fluticasone Propionate) .... Spray 1 Spray Into Both Nostrils Once A Day 3)  Bl Vitamin E 400 Unit  Caps (Vitamin E) .... Once Daily 4)  Advair Diskus 250-50 Mcg/dose  Misc (Fluticasone-Salmeterol) .... Uses As Needed 5)  Omeprazole 20 Mg  Tbec (Omeprazole) .Marland Kitchen.. 1 By Mouth Once Daily 6)  Valtrex 500 Mg  Tabs (Valacyclovir Hcl) .... Prn 7)  Amitiza 24 Mcg  Caps (Lubiprostone) .... Once Daily 8)  Mag-Ox 400 400 Mg  Tabs (Magnesium Oxide) .... Two Once Daily 9)  Ambien 10 Mg  Tabs (Zolpidem Tartrate) .... On By Mouth Q Hs As  Needed 10)  Amitriptyline Hcl 100 Mg  Tabs (Amitriptyline Hcl) .... Take 1 At Bedtime 11)  Potassium Chloride Crys Cr 20 Meq  Tbcr (Potassium Chloride Crys Cr) .Marland Kitchen.. 1 Qd 12)  Pyridium 200 Mg  Tabs (Phenazopyridine Hcl) .Marland Kitchen.. 1 Three Times A Day 13)  Maxzide-25 37.5-25 Mg  Tabs (Triamterene-Hctz) .Marland Kitchen.. 1 Once Daily As Needed For Edema 14)  Hydroxyzine Hcl 25 Mg  Tabs (Hydroxyzine Hcl) .Marland Kitchen.. 1 By Mouth Three Times A Day As Needed Itching 15)  Hemocyte Plus 106-1 Mg  Caps (B Complex-C-Min-Fe-Fa) .... Take 1 By Mouth Two Times A Day 16)  Cyanocobalamin 1000 Mcg/ml Inj Soln (Cyanocobalamin) .... Inject 1 Ml Im Once A Week For 4 Weeks Then Inject 1 Ml Once A Month 17)  Flexeril 10 Mg Tabs (Cyclobenzaprine Hcl) .Marland Kitchen.. 1 Three Times A Day For Muscle Spasm 18)  Alprazolam 1 Mg Tabs (Alprazolam) .Marland Kitchen.. 1 Three Times A Day As Needed Stress Needs Office Visit 19)  Optivar 0.05 % Soln (Azelastine Hcl) .Marland Kitchen.. 1 Drop  Each  Eye Bid 20)  Lyrica 50 Mg Caps (Pregabalin) .Marland Kitchen.. 1 Two Times A Day 21)  Prednisone 10 Mg  Tabs (Prednisone) .... Take 3 (30mg ) By Mouth The Day Before Iron Infusion. 22)  Klor-Con M20 20 Meq Cr-Tabs (Potassium Chloride Crys Cr) .Marland Kitchen.. 1 By Mouth Once Daily  Needs Office Visit 23)  Bentyl 20 Mg Tabs (Dicyclomine  Hcl) .... Take One By Mouth 3 Times Daily As Needed 24)  Levaquin 750 Mg Tabs (Levofloxacin) .Marland Kitchen.. 1 Once Daily For Infection 25)  Nabumetone 750 Mg Tabs (Nabumetone) .Marland Kitchen.. 1 By Mouth Two Times A Day 26)  Tramadol Hcl 50 Mg Tabs (Tramadol Hcl) .Marland Kitchen.. 1 or 2 Evry 4 -6 Hrs As Needed For Back Pain  Allergies (verified): 1)  ! Demerol 2)  ! Hydrocodone 3)  ! Ciprofloxacin Hcl (Ciprofloxacin Hcl) 4)  Codeine Phosphate (Codeine Phosphate) 5)  Hydrocodone-Homatropine (Hydrocodone-Homatropine) 6)  Sulfamethoxazole (Sulfamethoxazole) 7)  Zithromax (Azithromycin)  Past History:  Past Medical History: Last updated: 07/09/2008 Asthma Obesity DIVERTICULOSIS, COLON (ICD-562.10) Hx of ESOPHAGEAL  STRICTURE (ICD-530.3) VITAMIN B12 DEFICIENCY (ICD-266.2) COLONIC POLYPS, ADENOMATOUS, HX OF (ICD-V12.72) ANEMIA (ICD-285.9) ACUTE PHARYNGITIS (ICD-462) LARYNGITIS, ACUTE (ICD-464.00) NUMBNESS (ICD-782.0) UTI (ICD-599.0) ENTERITIS (ICD-558.9) URINARY URGENCY (ICD-788.63) BACK PAIN (ICD-724.5) ANXIETY (ICD-300.00) BARIATRIC SURGERY STATUS (ICD-V45.86) FATIGUE (ICD-780.79) INSOMNIA (ICD-780.52) ANEMIA, CHRONIC (ICD-281.9) CYSTITIS, ACUTE (ICD-595.0) BACK PAIN (ICD-724.5) GASTROINTESTINAL HEMORRHAGE, HX OF (ICD-V12.79) ASTHMA (ICD-493.90) ALLERGIC RHINITIS (ICD-477.9)  Past Surgical History: Last updated: 07/09/2008 Cholecystectomy Ileojejunal Bypass  Tonsillectomy Back surgery-ruptured disk  Social History: Last updated: 08/04/2008 Illicit Drug Use - no Patient has never smoked.  Alcohol Use - no Daily Caffeine Use  diet coke  and mt dew some coffee Occupation: [employer]  Risk Factors: Smoking Status: quit > 6 months (03/30/2009) Packs/Day: <0.25 (03/30/2009)  Review of Systems  The patient denies anorexia, fever, weight loss, weight gain, vision loss, decreased hearing, hoarseness, chest pain, syncope, dyspnea on exertion, peripheral edema, prolonged cough, headaches, hemoptysis, abdominal pain, melena, hematochezia, severe indigestion/heartburn, hematuria, incontinence, genital sores, muscle weakness, suspicious skin lesions, transient blindness, difficulty walking, depression, unusual weight change, abnormal bleeding, enlarged lymph nodes, angioedema, breast masses, and testicular masses.    Physical Exam  General:  Well-developed,well-nourished,in no acute distress; alert,appropriate and cooperative throughout examinationoverweight-appearing.   Head:  Normocephalic and atraumatic without obvious abnormalities. No apparent alopecia or balding. Eyes:  No corneal or conjunctival inflammation noted. EOMI. Perrla. Funduscopic exam benign, without hemorrhages, exudates or  papilledema. Vision grossly normal. Ears:  External ear exam shows no significant lesions or deformities.  Otoscopic examination reveals clear canals, tympanic membranes are intact bilaterally without bulging, retraction, inflammation or discharge. Hearing is grossly normal bilaterally. Nose:  External nasal examination shows no deformity or inflammation. Nasal mucosa are pink and moist without lesions or exudates. Mouth:  Oral mucosa and oropharynx without lesions or exudates.  Teeth in good repair. Lungs:  Normal respiratory effort, chest expands symmetrically. Lungs are clear to auscultation, no crackles or wheezes. Heart:  Normal rate and regular rhythm. S1 and S2 normal without gallop, murmur, click, rub or other extra sounds. Abdomen:  obesity and has a monilial dermatitis over the lower abdominal area especially on the rightno abdominal hernia, no inguinal hernia, no hepatomegaly, and no splenomegaly.   Rectal:  on exam Msk:  ark tenderness over both sacroiliac joints limitation straight leg raise and bone right to 60, reflexes equal bilaterally Pulses:  R and L carotid,radial,femoral,dorsalis pedis and posterior tibial pulses are full and equal bilaterally Extremities:  No clubbing, cyanosis, edema, or deformity noted with normal full range of motion of all joints.   Neurologic:  none this left lower leg and foot   Impression & Recommendations:  Problem # 1:  DISC DISEASE, LUMBOSACRAL SPINE (ICD-722.52) Assessment New  Orders: Depo- Medrol 80mg  (J1040) Depo- Medrol 80mg  (J1040) Admin of Therapeutic Inj  intramuscular or subcutaneous (60454) Radiology Referral (  Radiology) Prescription Created Electronically [phone])  Problem # 2:  HYPERTENSION (ICD-401.9) Assessment: Improved  Her updated medication list for this problem includes:    Maxzide-25 37.5-25 Mg Tabs (Triamterene-hctz) .Marland Kitchen... 1 once daily as needed for edema  Problem # 3:  Hx of ESOPHAGEAL STRICTURE  (ICD-530.3) Assessment: Improved  Problem # 4:  VITAMIN B12 DEFICIENCY (ICD-266.2) Assessment: Unchanged  Problem # 5:  NUMBNESS (ICD-782.0) Assessment: Unchanged left lower leg and foot  Problem # 6:  BACK PAIN (ICD-724.5) Assessment: Deteriorated  Her updated medication list for this problem includes:    Flexeril 10 Mg Tabs (Cyclobenzaprine hcl) .Marland Kitchen... 1 three times a day for muscle spasm    Nabumetone 750 Mg Tabs (Nabumetone) .Marland Kitchen... 1 by mouth two times a day    Tramadol Hcl 50 Mg Tabs (Tramadol hcl) .Marland Kitchen... 1 or 2 evry 4 -6 hrs as needed for back pain  Problem # 7:  ANXIETY (ICD-300.00) Assessment: Unchanged  Her updated medication list for this problem includes:    Amitriptyline Hcl 100 Mg Tabs (Amitriptyline hcl) .Marland Kitchen... Take 1 at bedtime    Hydroxyzine Hcl 25 Mg Tabs (Hydroxyzine hcl) .Marland Kitchen... 1 by mouth three times a day as needed itching    Alprazolam 1 Mg Tabs (Alprazolam) .Marland Kitchen... 1 three times a day as needed stress needs office visit  Problem # 8:  BARIATRIC SURGERY STATUS (ICD-V45.86) Assessment: Unchanged  Complete Medication List: 1)  Fexofenadine Hcl 180 Mg Tabs (Fexofenadine hcl) .... Take 1 tablet by mouth once a day 2)  Flonase 50 Mcg/act Susp (Fluticasone propionate) .... Spray 1 spray into both nostrils once a day 3)  Bl Vitamin E 400 Unit Caps (Vitamin e) .... Once daily 4)  Advair Diskus 250-50 Mcg/dose Misc (Fluticasone-salmeterol) .... Uses as needed 5)  Omeprazole 20 Mg Tbec (Omeprazole) .Marland Kitchen.. 1 by mouth once daily 6)  Valtrex 500 Mg Tabs (Valacyclovir hcl) .... Prn 7)  Amitiza 24 Mcg Caps (Lubiprostone) .... Once daily 8)  Mag-ox 400 400 Mg Tabs (Magnesium oxide) .... Two once daily 9)  Ambien 10 Mg Tabs (Zolpidem tartrate) .... On by mouth q hs as needed 10)  Amitriptyline Hcl 100 Mg Tabs (Amitriptyline hcl) .... Take 1 at bedtime 11)  Potassium Chloride Crys Cr 20 Meq Tbcr (Potassium chloride crys cr) .Marland Kitchen.. 1 qd 12)  Pyridium 200 Mg Tabs (Phenazopyridine hcl)  .Marland Kitchen.. 1 three times a day 13)  Maxzide-25 37.5-25 Mg Tabs (Triamterene-hctz) .Marland Kitchen.. 1 once daily as needed for edema 14)  Hydroxyzine Hcl 25 Mg Tabs (Hydroxyzine hcl) .Marland Kitchen.. 1 by mouth three times a day as needed itching 15)  Hemocyte Plus 106-1 Mg Caps (B complex-c-min-fe-fa) .... Take 1 by mouth two times a day 16)  Cyanocobalamin 1000 Mcg/ml Inj Soln (Cyanocobalamin) .... Inject 1 ml im once a week for 4 weeks then inject 1 ml once a month 17)  Flexeril 10 Mg Tabs (Cyclobenzaprine hcl) .Marland Kitchen.. 1 three times a day for muscle spasm 18)  Alprazolam 1 Mg Tabs (Alprazolam) .Marland Kitchen.. 1 three times a day as needed stress needs office visit 19)  Optivar 0.05 % Soln (Azelastine hcl) .Marland Kitchen.. 1 drop  each  eye bid 20)  Lyrica 50 Mg Caps (Pregabalin) .Marland Kitchen.. 1 two times a day 21)  Prednisone 10 Mg Tabs (Prednisone) .... Take 3 (30mg ) by mouth the day before iron infusion. 22)  Klor-con M20 20 Meq Cr-tabs (Potassium chloride crys cr) .Marland Kitchen.. 1 by mouth once daily  needs office visit 23)  Bentyl 20 Mg Tabs (Dicyclomine  hcl) .... Take one by mouth 3 times daily as needed 24)  Levaquin 750 Mg Tabs (Levofloxacin) .Marland Kitchen.. 1 once daily for infection 25)  Nabumetone 750 Mg Tabs (Nabumetone) .Marland Kitchen.. 1 by mouth two times a day 26)  Tramadol Hcl 50 Mg Tabs (Tramadol hcl) .Marland Kitchen.. 1 or 2 evry 4 -6 hrs as needed for back pain  Patient Instructions: 1)  To schediule MRI as recommended by neurosurgeon previously 2)  Has had epidural injections and needs MRI if they are to be repeated. 3)  continue  tramadol or darvocet 4)  will call as soon as get report 5)  Depomedrol 160 mg IM as antiinflammatory 6)  continue relafen   Medication Administration  Injection # 1:    Medication: Depo- Medrol 80mg     Diagnosis: DISC DISEASE, LUMBOSACRAL SPINE (ICD-722.52)    Route: IM    Site: RUOQ gluteus    Exp Date: 01/2012    Lot #: [id number]    Mfr: [employer]    Patient tolerated injection without complications    Given by: Judithann Sheen MD  (April 22, 2009 5:40 PM)  Injection # 2:    Medication: Depo- Medrol 80mg     Diagnosis: DISC DISEASE, LUMBOSACRAL SPINE (ICD-722.52)    Route: IM    Site: RUOQ gluteus    Exp Date: 01/2012    Lot #: [id number]    Mfr: [employer]    Patient tolerated injection without complications    Given by: Judithann Sheen MD (April 22, 2009 5:41 PM)  Orders Added: 1)  Depo- Medrol 80mg  [J1040] 2)  Depo- Medrol 80mg  [J1040] 3)  Admin of Therapeutic Inj  intramuscular or subcutaneous [96372] 4)  Radiology Referral [Radiology] 5)  Prescription Created Electronically [G8553] 6)  Est. Patient Level IV [86578]

## 2010-04-26 NOTE — Op Note (Signed)
Summary: [facility] Stay/[facility]  [facility] Stay/[facility]   Imported By: Lester [place]boro 09/18/2008 09:10:53  _____________________________________________________________________  External Attachment:    Type:   Image     Comment:   External Document

## 2010-04-26 NOTE — Progress Notes (Signed)
Summary: NEEDS NEW RX  Phone Note Call from Patient Call back at Work Phone [phone]   Caller: Patient-LIVE CALL Summary of Call: WANTS MAGIC MOUTH WASH CALLED TO [facility] [address]. PLEASE INFORM HER WHEN READY. Initial call taken by: Warnell Forester,  December 30, 2008 11:45 AM  Follow-up for Phone Call        will call magic mouthwash

## 2010-04-26 NOTE — Progress Notes (Signed)
Summary: REQUEST FOR RESULTS  Phone Note Call from Patient   Caller: Patient   [phone] Summary of Call: Pt called to obtain results of recent labwork (ua) ?    Pt can be reached at [phone].  Initial call taken by: Debbra Riding,  February 04, 2010 3:10 PM  Follow-up for Phone Call        Pt informed Follow-up by: Josph Macho RMA,  February 04, 2010 3:24 PM

## 2010-04-26 NOTE — Progress Notes (Signed)
Summary: wants hgb  Phone Note Call from Patient   Caller: Patient Call For: Dr. Scotty Court Summary of Call: Pt wants to schedule a hgb for tomorrow with no office visit.  She feels "bad" and thinks he blood count is down again. [phone] Initial call taken by: Lynann Beaver CMA,  Aug 07, 2007 1:51 PM  Follow-up for Phone Call        per dr Scotty Court he has not seen her since june 08 and needs to be seen.  we have appt times available tomrorrow.  Follow-up by: Pura Spice, RN,  Aug 07, 2007 2:26 PM  Additional Follow-up for Phone Call Additional follow up Details #1::        Pt notified and appt scheduled. Additional Follow-up by: Lynann Beaver CMA,  Aug 07, 2007 2:49 PM

## 2010-04-26 NOTE — Progress Notes (Signed)
Summary: cyanocobalamin refill w 11 refills  Phone Note From Pharmacy   Caller: [facility]  [address] # [id number]* Reason for Call: Needs renewal Summary of Call: cyanocobalaminv1000 micrograms/ml  Initial call taken by: Pura Spice, RN,  October 30, 2007 10:11 AM    New/Updated Medications: CYANOCOBALAMIN 1000 MCG/ML INJ SOLN (CYANOCOBALAMIN) inject 1 ml IM once a week for 4 weeks then inject 1 ml once a month   Prescriptions: CYANOCOBALAMIN 1000 MCG/ML INJ SOLN (CYANOCOBALAMIN) inject 1 ml IM once a week for 4 weeks then inject 1 ml once a month  #30 ml x 11   Entered by:   Pura Spice, RN   Authorized by:   Judithann Sheen MD   Signed by:   Pura Spice, RN on 10/30/2007   Method used:   Electronically sent to ...       [facility]  [address] # [id number]*       [address]       [place], [place]  [address]       Ph: [phone] or [phone]       Fax: [phone]   RxID:   [id number]

## 2010-04-26 NOTE — Progress Notes (Signed)
Summary: RF  Phone Note Call from Patient Call back at Home Phone [phone]   Caller: Patient-LIVE CALL Call For: DR [id number] Summary of Call: NEED NEW RX FOR SEA SICKNESS PATCHES CALLED [facility] [address].  ALSO NEED A RF OF MAGIC MOUTH WASH. Initial call taken by: Warnell Forester,  October 08, 2007 2:43 PM  Follow-up for Phone Call        per dr Scotty Court  he called  in transderm scop and magic mouthwash  Follow-up by: Pura Spice, RN,  October 09, 2007 7:38 AM    New/Updated Medications: TRANSDERM-SCOP 1.5 MG  PT72 (SCOPOLAMINE BASE) use as directed NYSTATIN 100000 UNIT/ML  SUSP (NYSTATIN)  TRANSDERM-SCOP 1.5 MG  PT72 (SCOPOLAMINE BASE)    Prescriptions: TRANSDERM-SCOP 1.5 MG  PT72 (SCOPOLAMINE BASE) use as directed  #1 box x 0   Entered by:   Pura Spice, RN   Authorized by:   Judithann Sheen MD   Signed by:   Pura Spice, RN on 10/09/2007   Method used:   Electronically sent to ...       [facility]  [address] # [id number]*       [address]       [place], [place]  [address]       Ph: [phone] or [phone]       Fax: [phone]   RxID:   [id number]

## 2010-04-26 NOTE — Consult Note (Signed)
Summary: [facility]  [facility]   Imported By: Maryln Gottron 05/22/2008 13:23:04  _____________________________________________________________________  External Attachment:    Type:   Image     Comment:   External Document

## 2010-04-26 NOTE — Letter (Signed)
Summary: [facility]  [facility]   Imported By: Maryln Gottron 01/24/2008 15:07:46  _____________________________________________________________________  External Attachment:    Type:   Image     Comment:   External Document

## 2010-04-26 NOTE — Procedures (Signed)
Summary: EGD   EGD  Procedure date:  08/25/2008  Findings:      Location: [facility]    ENDOSCOPY PROCEDURE REPORT  PATIENT:  Alexis Olson, Alexis Olson  MR#:  [id number] BIRTHDATE:   04-29-1933, 77 yrs. old   GENDER:   female  ENDOSCOPIST:   Hedwig Morton. Juanda Chance, MD Referred by:   PROCEDURE DATE:  08/25/2008 PROCEDURE:  EGD with enteroscopy ASA CLASS:   Class II INDICATIONS: iron deficiency anemia, hemeoccult positive stool s/p jejunoileal bypass in 1960ies for obesity, intermittent diarrhea  last EGD/Colon non diadnostic  CEA 5.5, tTG negative IBD markers positive for UC  MEDICATIONS:    Versed 6 mg, Fentanyl 75 mcg TOPICAL ANESTHETIC:   Exactacain Spray  DESCRIPTION OF PROCEDURE:   After the risks benefits and alternatives of the procedure were thoroughly explained, informed consent was obtained.  The LB [id number] endoscope was introduced through the mouth and advanced to the proximal jejunum, without limitations.  The instrument was slowly withdrawn as the mucosa was fully examined. <<PROCEDUREIMAGES>>            <<OLD IMAGES>>  A stricture was found in the distal esophagus (see image1 and image2). nonobstructing fibrous ring  There were multiple polyps identified. in the body of the stomach. fundic gland polyps With standard forceps, a biopsy was obtained and sent to pathology (see image4 and image8).  The duodenal bulb was normal in appearance, as was the postbulbar duodenum. no avm"s, no anastomosis, reached 130cm from the incissors With standard forceps, a biopsy was obtained and sent to pathology (see image7, image6, and image5).    Retroflexed views revealed no abnormalities.    The scope was then withdrawn from the patient and the procedure completed.  COMPLICATIONS:   None  ENDOSCOPIC IMPRESSION:  1) Stricture in the distal esophagus  2) Polyps, multiple in the body of the stomach  3) Normal duodenum  nonobstructing fibrous ring, not dilated, nothing to  account for iron def anemia RECOMMENDATIONS:  1) await biopsy results  colonoscopy  REPEAT EXAM:   In 0 year(s) for.   _______________________________ Hedwig Morton. Juanda Chance, MD    CC:     REPORT OF SURGICAL PATHOLOGY   Case #: [id number] Patient Name: Alexis Olson. Office Chart Number:  N/A   MRN: [id number] Pathologist: Ferd Hibbs. Colonel Bald, MD DOB/Age  [birth year] (Age: 77)    Gender: F Date Taken:  08/25/2008 Date Received: 08/26/2008   FINAL DIAGNOSIS   ***MICROSCOPIC EXAMINATION AND DIAGNOSIS***   1.  JEJUNUM, BIOPSY:   -  BENIGN SMALL BOWEL MUCOSA.   -  NO VILLOUS ATROPHY, INFLAMMATION OR OTHER ABNORMALITIES IDENTIFIED.   2.  STOMACH POLYP:   -  HYPERPLASTIC POLYP. -  MALIGNANT FEATURES ARE NOT IDENTIFIED. -  SEE COMMENT.   COMMENT A Warthin-Starry stain is performed to determine the possibility of the presence of Helicobacter pylori. The Warthin-Starry stain is negative for organisms of Helicobacter pylori. The control(s) stained appropriately.     kv Date Reported:  08/27/2008     Ivin Booty B. Colonel Bald, MD *** Electronically Signed Out By [facility] ***  August 28, 2008 MRN: [id number]    [facility] [address] [place], [place]  [address]    Dear Ms. Emami,  I am pleased to inform you that the biopsies taken during your recent endoscopic examination did not show any evidence of cancer upon pathologic examination.The tissue from the small intestine appears normal  Additional information/recommendations:  __No further action is needed  at this time.  Please follow-up with      your primary care physician for your other healthcare needs.  __ Please call [phone] to schedule a return visit to review      your condition. x __ Continue with the treatment plan as outlined on the day of your      exam.     Please call us if you are having persistent problems or have questions about your condition that have not been fully answered at this time.  Sincerely,  Hart Carwin  This letter has been electronically signed by your physician.   Signed by Hart Carwin on 08/28/2008 at 6:46 AM  ________________________________ This report was created from the original endoscopy report, which was reviewed and signed by the above listed endoscopist.

## 2010-04-26 NOTE — Progress Notes (Signed)
Summary: Triage-Labs  Phone Note Call from Patient Call back at Home Phone [phone]   Caller: Patient Call For: Dr. Juanda Chance Reason for Call: Talk to Nurse Summary of Call: Needs order to have her iron level checked Initial call taken by: Karna Christmas,  July 05, 2009 2:29 PM  Follow-up for Phone Call         Pt. is on IV antibiotics via a PICC Line for a spinal abcess. She c/o feeling tired and worn out. States her HGB 2 weeks ago was 12.4, but she wants her Iron checked.  (Iron on 05-04-09 was 50.)  DR.BRODIE PLEASE ADVISE  Follow-up by: [name] LPN,  July 05, 2009 3:15 PM  Additional Follow-up for Phone Call Additional follow up Details #1::        please have her  come for Fe, TIBC, ferritin,,B12 Additional Follow-up by: Hart Carwin MD,  July 05, 2009 3:34 PM    Additional Follow-up for Phone Call Additional follow up Details #2::    Above MD orders reviewed with patient. Lab orders are in IDX, pt. will come tomorrow. Pt. instructed to call back as needed.  Follow-up by: [name] LPN,  July 05, 2009 3:42 PM

## 2010-04-26 NOTE — Progress Notes (Signed)
Summary: TRIAGE  Phone Note Call from Patient Call back at Home Phone [phone] Call back at Work Phone [phone]   Caller: Patient Call For: brodie Reason for Call: Talk to Nurse Summary of Call: Patient was told by Dr Juanda Chance that she wanted to see her two weeks after she had iron infusions. Initial call taken by: Tawni Levy,  July 20, 2008 3:12 PM  Follow-up for Phone Call        REV scheduled for 08-04-08 at 2pm. Pt. states she is having episodes of nausea and sweating since her iron transfusion on 07-15-08.  1) Phenergan as needed 2) Soft,bland diet. No spicy,greasy,fried foods. Advance diet as tolerated. 3) If symptoms become worse call back immediately. 4) I will call pt., if new orders, after MD reviews.   Follow-up by: [name] LPN,  July 20, 2008 3:34 PM  Additional Follow-up for Phone Call Additional follow up Details #1::        I have spoken to the patient at length, stressed out about her dying sister. She will have a repeat H/H in 2 weeks. Additional Follow-up by: Hart Carwin,  July 20, 2008 5:03 PM

## 2010-04-26 NOTE — Progress Notes (Signed)
Summary: refill potassium cl 20 meq  w 11 refills  Phone Note From Pharmacy   Caller: [facility]  [address] # [id number]* Reason for Call: Needs renewal Summary of Call: refill potassiuim cl 20 meq  Initial call taken by: Pura Spice, RN,  Aug 06, 2007 11:16 AM  Follow-up for Phone Call        rx faxed  Follow-up by: Pura Spice, RN,  Aug 06, 2007 11:18 AM    New/Updated Medications: POTASSIUM CHLORIDE CRYS CR 20 MEQ  TBCR (POTASSIUM CHLORIDE CRYS CR)

## 2010-04-26 NOTE — Letter (Signed)
Summary: [facility]  [facility]   Imported By: Maryln Gottron 05/25/2009 09:39:08  _____________________________________________________________________  External Attachment:    Type:   Image     Comment:   External Document

## 2010-04-26 NOTE — Medication Information (Signed)
Summary: Refill Request for Hydroxyzine Hcl  Refill Request for Hydroxyzine Hcl   Imported By: Maryln Gottron 09/16/2009 14:49:58  _____________________________________________________________________  External Attachment:    Type:   Image     Comment:   External Document

## 2010-04-26 NOTE — Progress Notes (Signed)
Summary: rx for potassium needs ov   Phone Note Call from Patient   Caller: Patient Summary of Call: stated got rx for potassium but has been on 7meq"for 30 yrs". Initial call taken by: Pura Spice, RN,  September 17, 2008 12:58 PM  Follow-up for Phone Call        [place] notified spoke to courtney and rx given for potassium 1 by mouth once daily informed pt to bring all meds with her and at last ov med list gone over with pt and she verbalized meds "all are right" also informed pt she needs to see dr Scotty Court for office visit and potassium check with in next 2 months.  Follow-up by: Pura Spice, RN,  September 17, 2008 1:00 PM    New/Updated Medications: KLOR-CON M20 20 MEQ CR-TABS (POTASSIUM CHLORIDE CRYS CR) 1 by mouth once daily  needs office visit   Prescriptions: KLOR-CON M20 20 MEQ CR-TABS (POTASSIUM CHLORIDE CRYS CR) 1 by mouth once daily  needs office visit  #30 x 3   Entered by:   Pura Spice, RN   Authorized by:   Judithann Sheen MD   Signed by:   Pura Spice, RN on 09/17/2008   Method used:   Electronically to        [facility]. # [id number]* (retail)       [address]       [place], [place]  [address]       Ph: [phone] or [phone]       Fax: [phone]   RxID:   [id number]

## 2010-04-26 NOTE — Progress Notes (Signed)
Summary: TRIAGE  Phone Note Call from Patient Call back at Work Phone [phone]   Caller: Patient Call For: Dr. Juanda Chance Reason for Call: Talk to Nurse Summary of Call: pt reporting that she is losing her breath and is fatigued... didnt know if this was from her low iron levels Initial call taken by: Vallarie Mare,  February 12, 2009 3:13 PM  Follow-up for Phone Call        Alexis Olson patient Follow-up by: Louis Meckel MD,  February 12, 2009 3:59 PM  Additional Follow-up for Phone Call Additional follow up Details #1::        Pt's iron level on 02/02/2009 was normal at 22%, so unless she had a major bleed, her iron levels are not a problem. May be , she needs to check with her PCP, ? CXR, ?EKG etc. TSH, in an other word, look for other causes. Next CBC,Iron sudies  before Christmas Additional Follow-up by: Hart Carwin MD,  February 12, 2009 5:51 PM    Additional Follow-up for Phone Call Additional follow up Details #2::    Pt. unavailable, I will try back later. [name] LPN  February 15, 2009 9:08 AM  Above MD orders reviewed with patient. Pt. will have labs drawn 2nd week of December. Pt. instructed to call back as needed.  Follow-up by: [name] LPN,  February 15, 2009 10:18 AM

## 2010-04-26 NOTE — Miscellaneous (Signed)
Summary: mammogram report   Clinical Lists Changes  Observations: Added new observation of MAMMOGRAM: normal (08/30/2009 14:10)      Preventive Care Screening  Mammogram:    Date:  08/30/2009    Results:  normal     [facility]  [phone]

## 2010-04-26 NOTE — Progress Notes (Signed)
Summary: refill omperazole needs cpx  appt   Phone Note From Pharmacy   Caller: [facility] [address]  Reason for Call: Needs renewal Summary of Call: refill omperazole   Follow-up for Phone Call        e s cripted  Follow-up by: Pura Spice, RN,  January 16, 2008 4:58 PM    New/Updated Medications: OMEPRAZOLE 20 MG  TBEC (OMEPRAZOLE) 1 by mouth once daily  needs to call for appt   Prescriptions: OMEPRAZOLE 20 MG  TBEC (OMEPRAZOLE) 1 by mouth once daily  needs to call for appt  #30 x 3   Entered by:   Pura Spice, RN   Authorized by:   Judithann Sheen MD   Signed by:   Pura Spice, RN on 01/16/2008   Method used:   Electronically to        [facility]. # [id number]* (retail)       [address]       [place], [place]  [address]       Ph: [phone] or [phone]       Fax: [phone]   RxID:   [id number]

## 2010-04-26 NOTE — Miscellaneous (Signed)
Summary: [facility]  [facility]   Imported By: Margie Billet 06/22/2009 09:02:00  _____________________________________________________________________  External Attachment:    Type:   Image     Comment:   External Document

## 2010-04-26 NOTE — Progress Notes (Signed)
Summary: resch lab  Phone Note Call from Patient Call back at Home Phone [phone] Call back at Work Phone [phone]   Caller: Patient Call For: Dr. Juanda Chance Reason for Call: Talk to Nurse Summary of Call: pt would like to resch her labs Initial call taken by: Vallarie Mare,  December 21, 2008 1:50 PM  Follow-up for Phone Call        Dr Juanda Chance-  Patient is scheduled for routine labs on 10-18, however she states that she is feeling extremely fatigued lately. She would like to know if she can go ahead and have her labs drawn earlier. I told her I did not think that you would have a problem with her getting the labs done earlier. I did just want to find out if you wanted me to order any labs in additon to her CBC, FE and TIBC. Please advise..... Follow-up by: Hortense Ramal CMA Duncan Dull),  December 21, 2008 4:39 PM  Additional Follow-up for Phone Call Additional follow up Details #1::        Yes, CBC, Fe, TIBC, Ferritin,B12 Additional Follow-up by: Hart Carwin MD,  December 21, 2008 11:03 PM    Additional Follow-up for Phone Call Additional follow up Details #2::    Additional labs ordered and patient advised to come for labs this week. Follow-up by: Hortense Ramal CMA Duncan Dull),  December 22, 2008 8:48 AM

## 2010-04-26 NOTE — Assessment & Plan Note (Signed)
Summary: ? ALLERGY/CCM   Vital Signs:  Patient profile:   77 year old female Weight:      205 pounds BMI:     34.24 O2 Sat:      93 % Temp:     98.1 degrees F Pulse rate:   94 / minute BP sitting:   120 / 84  (left arm)  Vitals Entered By: Pura Spice, RN (December 24, 2008 2:59 PM) CC: allergies    History of Present Illness: Allergies, takes flonase, zyrtec, other inhalers, been wheezing , coughing good deal paper ink causing, has low grade fever which she reelates to allergies, when humidity up to increase air condition, depomedrol has helped Under Dr. Maricela Curet care regarding her chronic anemia, receives infusions iron periodically IBS under controll at present also decrease incidence of UTI continues to have back pain, cannot tolerat NSAIDS  Allergies: 1)  ! Demerol 2)  ! Hydrocodone 3)  ! Ciprofloxacin Hcl (Ciprofloxacin Hcl) 4)  Codeine Phosphate (Codeine Phosphate) 5)  Hydrocodone-Homatropine (Hydrocodone-Homatropine) 6)  Sulfamethoxazole (Sulfamethoxazole) 7)  Zithromax (Azithromycin)  Review of Systems      See HPI General:  See HPI; Complains of sleep disorder. Eyes:  Denies blurring, discharge, double vision, eye irritation, eye pain, halos, itching, light sensitivity, red eye, vision loss-1 eye, and vision loss-both eyes. ENT:  Complains of nasal congestion and postnasal drainage. CV:  Denies bluish discoloration of lips or nails, chest pain or discomfort, difficulty breathing at night, difficulty breathing while lying down, fainting, fatigue, leg cramps with exertion, lightheadness, near fainting, palpitations, shortness of breath with exertion, swelling of feet, swelling of hands, and weight gain. Resp:  Complains of cough. GI:  Complains of constipation; better control with amitiza. GU:  Denies abnormal vaginal bleeding, decreased libido, discharge, dysuria, genital sores, hematuria, incontinence, nocturia, urinary frequency, and urinary hesitancy. MS:   Complains of low back pain.  Physical Exam  General:  Well-developed,well-nourished,in no acute distress; alert,appropriate and cooperative throughout examination Head:  Normocephalic and atraumatic without obvious abnormalities. No apparent alopecia or balding. Eyes:  No corneal or conjunctival inflammation noted. EOMI. Perrla. Funduscopic exam benign, without hemorrhages, exudates or papilledema. Vision grossly normal. Ears:  External ear exam shows no significant lesions or deformities.  Otoscopic examination reveals clear canals, tympanic membranes are intact bilaterally without bulging, retraction, inflammation or discharge. Hearing is grossly normal bilaterally. Nose:  nasal  congestion, slightly puralent Mouth:  pharyngeal erythema.   Neck:  No deformities, masses, or tenderness noted. Chest Wall:  No deformities, masses, or tenderness noted. Lungs:  minimal expiratory wheezes, rhochi Heart:  Normal rate and regular rhythm. S1 and S2 normal without gallop, murmur, click, rub or other extra sounds. Abdomen:  Bowel sounds positive,abdomen soft and non-tender without masses, organomegaly or hernias noted.   Impression & Recommendations:  Problem # 1:  ALLERGIC RHINITIS (ICD-477.9) Assessment Deteriorated  Her updated medication list for this problem includes:    Fexofenadine Hcl 180 Mg Tabs (Fexofenadine hcl) .Marland Kitchen... Take 1 tablet by mouth once a day    Flonase 50 Mcg/act Susp (Fluticasone propionate) ..... Spray 1 spray into both nostrils once a day    Astelin 137 Mcg/spray Soln (Azelastine hcl) .Marland Kitchen... At bedtime    Xyzal 5 Mg Tabs (Levocetirizine dihydrochloride)  Problem # 2:  ACUTE PHARYNGITIS (ICD-462) Assessment: Deteriorated  Her updated medication list for this problem includes:    Nystatin 100000 Unit/ml Susp (Nystatin)    Levaquin 750 Mg Tabs (Levofloxacin) .Marland Kitchen... 1 once daily  for infection  Problem # 3:  BACK PAIN (ICD-724.5) Assessment: Unchanged  Her updated  medication list for this problem includes:    Flexeril 10 Mg Tabs (Cyclobenzaprine hcl) .Marland Kitchen... 1 three times a day for muscle spasm  Orders: Depo- Medrol 40mg  (J1030) Admin of Therapeutic Inj  intramuscular or subcutaneous ([address]) Depo- Medrol 80mg  (J1040)  Problem # 4:  NUMBNESS (ICD-782.0) Assessment: Unchanged  Problem # 5:  ANEMIA, CHRONIC (ICD-281.9) Assessment: Unchanged  Her updated medication list for this problem includes:    Cyanocobalamin 1000 Mcg/ml Inj Soln (Cyanocobalamin) ..... Inject 1 ml im once a week for 4 weeks then inject 1 ml once a month  Problem # 6:  ANXIETY (ICD-300.00) Assessment: Unchanged  Her updated medication list for this problem includes:    Amitriptyline Hcl 100 Mg Tabs (Amitriptyline hcl) .Marland Kitchen... Take 1 at bedtime    Hydroxyzine Hcl 25 Mg Tabs (Hydroxyzine hcl) .Marland Kitchen... 1 by mouth three times a day as needed itching    Alprazolam 1 Mg Tabs (Alprazolam) .Marland Kitchen... 1 three times a day as needed stress needs office visit  Problem # 7:  ASTHMA (ICD-493.90) Assessment: Unchanged  Her updated medication list for this problem includes:    Advair Diskus 250-50 Mcg/dose Misc (Fluticasone-salmeterol) ..... Uses as needed    Prednisone 10 Mg Tabs (Prednisone) .Marland Kitchen... Take 3 (30mg ) by mouth the day before iron infusion.  Complete Medication List: 1)  Fexofenadine Hcl 180 Mg Tabs (Fexofenadine hcl) .... Take 1 tablet by mouth once a day 2)  Flonase 50 Mcg/act Susp (Fluticasone propionate) .... Spray 1 spray into both nostrils once a day 3)  Bl Vitamin E 400 Unit Caps (Vitamin e) .... Once daily 4)  Advair Diskus 250-50 Mcg/dose Misc (Fluticasone-salmeterol) .... Uses as needed 5)  Astelin 137 Mcg/spray Soln (Azelastine hcl) .... At bedtime 6)  Omeprazole 20 Mg Tbec (Omeprazole) .Marland Kitchen.. 1 by mouth once daily 7)  Valtrex 500 Mg Tabs (Valacyclovir hcl) .... Prn 8)  Amitiza 24 Mcg Caps (Lubiprostone) .... Once daily 9)  Mag-ox 400 400 Mg Tabs (Magnesium oxide) .... Two  once daily 10)  Ambien 10 Mg Tabs (Zolpidem tartrate) .... On by mouth q hs as needed 11)  Amitriptyline Hcl 100 Mg Tabs (Amitriptyline hcl) .... Take 1 at bedtime 12)  Potassium Chloride Crys Cr 20 Meq Tbcr (Potassium chloride crys cr) .Marland Kitchen.. 1 qd 13)  Pyridium 200 Mg Tabs (Phenazopyridine hcl) .Marland Kitchen.. 1 three times a day 14)  Neurontin 300 Mg Caps (Gabapentin) .Marland Kitchen.. 1 hs 15)  Maxzide-25 37.5-25 Mg Tabs (Triamterene-hctz) .Marland Kitchen.. 1 once daily as needed for edema 16)  Hydroxyzine Hcl 25 Mg Tabs (Hydroxyzine hcl) .Marland Kitchen.. 1 by mouth three times a day as needed itching 17)  Hemocyte Plus 106-1 Mg Caps (B complex-c-min-fe-fa) .... Take 1 by mouth two times a day 18)  Nystatin 100000 Unit/ml Susp (Nystatin) 19)  Transderm-scop 1.5 Mg Pt72 (Scopolamine base) 20)  Cyanocobalamin 1000 Mcg/ml Inj Soln (Cyanocobalamin) .... Inject 1 ml im once a week for 4 weeks then inject 1 ml once a month 21)  Xyzal 5 Mg Tabs (Levocetirizine dihydrochloride) 22)  Flexeril 10 Mg Tabs (Cyclobenzaprine hcl) .Marland Kitchen.. 1 three times a day for muscle spasm 23)  Alprazolam 1 Mg Tabs (Alprazolam) .Marland Kitchen.. 1 three times a day as needed stress needs office visit 24)  Optivar 0.05 % Soln (Azelastine hcl) .Marland Kitchen.. 1 drop  each  eye bid 25)  Lyrica 50 Mg Caps (Pregabalin) .Marland Kitchen.. 1 two times a day 26)  Prednisone 10 Mg Tabs (Prednisone) .... Take 3 (30mg ) by mouth the day before iron infusion. 27)  Klor-con M20 20 Meq Cr-tabs (Potassium chloride crys cr) .Marland Kitchen.. 1 by mouth once daily  needs office visit 28)  Bentyl 20 Mg Tabs (Dicyclomine hcl) .... Take one by mouth 3 times daily as needed 29)  Levaquin 750 Mg Tabs (Levofloxacin) .Marland Kitchen.. 1 once daily for infection  Other Orders: Admin 1st Vaccine (86578) Flu Vaccine 29yrs + (46962) Flu Vaccine Consent Questions     Do you have a history of severe allergic reactions to this vaccine? no    Any prior history of allergic reactions to egg and/or gelatin? no    Do you have a sensitivity to the preservative  Thimersol? no    Do you have a past history of Guillan-Barre Syndrome? no    Do you currently have an acute febrile illness? no    Have you ever had a severe reaction to latex? no    Vaccine information given and explained to patient? yes    Are you currently pregnant? no    Lot Number:[id number]   Exp Date:09/23/2009   Site Given  Left Deltoid IM.....gh.........rn.........  Other Orders: Admin 1st Vaccine ([address]) Flu Vaccine 19yrs + [phone])  Patient Instructions: 1)  Pharyngitis, bronchitis, allergic rhinitis, back pain 2)  Depomedrol 120 mg to help back and allergies 3)  Levaquin for infection 4)  refilled alprazolam for anxiety Prescriptions: KLOR-CON M20 20 MEQ CR-TABS (POTASSIUM CHLORIDE CRYS CR) 1 by mouth once daily  needs office visit  #30 x 11   Entered and Authorized by:   Judithann Sheen MD   Signed by:   Judithann Sheen MD on 12/24/2008   Method used:   Electron[facility] to        [facility]  [address] [id number] (retail)       [address]       [place], [place]  [address]       Ph: [phone] or [phone]       Fax: [phone]   RxID:   [id number] KLOR-CON M20 20 MEQ CR-TABS (POTASSIUM CHLORIDE CRYS CR) 1 by mouth once daily  needs office visit  #30 x 11   Entered and Authorized by:   Judithann Sheen MD   Signed by:   Judithann Sheen MD on 12/24/2008   Method used:   Print then Give to Patient   RxID:   [id number] LEVAQUIN 750 MG TABS (LEVOFLOXACIN) 1 once daily for infection  #7 x 0   Entered and Authorized by:   Judithann Sheen MD   Signed by:   Judithann Sheen MD on 12/24/2008   Method used:   Print then Give to Patient   RxID:   [id number] ALPRAZOLAM 1 MG TABS (ALPRAZOLAM) 1 three times a day as needed stress needs office visit  #90 x 5   Entered and Authorized by:   Judithann Sheen MD   Signed by:   Judithann Sheen MD on 12/24/2008   Method used:   Print then Give to Patient   RxID:    [id number] LYRICA 50 MG CAPS (PREGABALIN) 1 two times a day  #60 x 5   Entered and Authorized by:   Judithann Sheen MD   Signed by:   Judithann Sheen MD on 12/24/2008   Method used:  Print then Give to Patient   RxID:   [id number] AMBIEN 10 MG  TABS (ZOLPIDEM TARTRATE) on by mouth q hs as needed  #30 x 5   Entered and Authorized by:   Judithann Sheen MD   Signed by:   Judithann Sheen MD on 12/24/2008   Method used:   Print then Give to Patient   RxID:   [id number]    .lbflu   Medication Administration  Injection # 1:    Medication: Depo- Medrol 40mg     Diagnosis: BACK PAIN (ICD-724.5)    Route: IM    Site: RUOQ gluteus    Exp Date: 08/2011    Lot #: Loann Quill    Mfr: Pharmacia    Patient tolerated injection without complications    Given by: Pura Spice, RN (December 24, 2008 4:46 PM)  Injection # 2:    Medication: Depo- Medrol 80mg     Diagnosis: BACK PAIN (ICD-724.5)    Route: IM    Site: RUOQ gluteus    Exp Date: 08/2011    Lot #: Loann Quill    Mfr: Pharmacia  Orders Added: 1)  Admin 1st Vaccine [90471] 2)  Flu Vaccine 68yrs + [95284] 3)  Depo- Medrol 40mg  [J1030] 4)  Admin of Therapeutic Inj  intramuscular or subcutaneous [96372] 5)  Depo- Medrol 80mg  [J1040] 6)  Est. Patient Level IV [13244]

## 2010-04-26 NOTE — Progress Notes (Signed)
Summary: refill zolpiden and lyrica  Phone Note Refill Request Message from:  Fax from Pharmacy on December 31, 2009 10:44 AM  Refills Requested: Medication #1:  LYRICA 50 MG CAPS Take 1caplets by mouth two times a day   Last Refilled: 10/20/2009  Medication #2:  AMBIEN 10 MG  TABS on by mouth q hs as needed   Last Refilled: 11/17/2009 [facility] [address]   [phone]   Method Requested: Telephone to Pharmacy Initial call taken by: Duard Brady LPN,  December 31, 2009 10:46 AM  Follow-up for Phone Call        Called pharmacy and RX is there Follow-up by: Josph Macho RMA,  December 31, 2009 2:29 PM

## 2010-04-26 NOTE — Progress Notes (Signed)
Summary: pus in urine fu / no symptoms  Phone Note Call from Patient Call back at [phone]   Caller: pt vm triage Call For: [facility] Summary of Call: results of urine culture.  [facility] [address]  Initial call taken by: Roselle Locus,  February 27, 2008 2:52 PM  Follow-up for Phone Call        culture not back as it was done late yest. what type symptoms having  Follow-up by: Pura Spice, RN,  February 27, 2008 3:58 PM  Additional Follow-up for Phone Call Additional follow up Details #1::        No symptoms truly.  Following up on pus in her urine 2 weeks ago & yesterday.  Levaquin was finished 2-3 weeks ago.  [facility]  allergic to Cipro - causes yeast & Codeine Additional Follow-up by: Rudy Jew, RN,  February 27, 2008 4:15 PM    Additional Follow-up for Phone Call Additional follow up Details #2::    PT REQUESTING MEDS REFILLS AND RESULTS PER DR STAFFORD  PT NEEDS 6 MTH OV FOR REFILL OF CONTROLS. PT APPT TODAY  Follow-up by: Pura Spice, RN,  March 04, 2008 2:21 PM

## 2010-04-26 NOTE — Letter (Signed)
Summary: [facility]  [facility] Provider: This provider was preselected by the workflow.  Signature: The signature status of this document was preset by the workflow  Processed by [employer] Local Indexer Client @ Friday, February 12, 2009 2:56:03 PM using version:2010.1.2.11(2.4)   Manually Indexed By: [id number]  idlBatchDetail: [id number]   _____________________________________________________________________  External Attachment:    Type:   Image     Comment:   External Document

## 2010-04-26 NOTE — Progress Notes (Signed)
Summary: wants results  Phone Note Call from Patient Call back at Home Phone [phone] Call back at [phone]   Caller: Patient-live call Call For: dr Scotty Court Reason for Call: Lab or Test Results Summary of Call: Pt would like her results Initial call taken by: Warnell Forester,  January 29, 2008 1:10 PM  Follow-up for Phone Call        CALLED RESULTS GIVEN  Follow-up by: Pura Spice, RN,  January 29, 2008 4:15 PM

## 2010-04-26 NOTE — Assessment & Plan Note (Signed)
Summary: requesting hgb/dm   Vital Signs:  Patient Profile:   77 Years Old Female Weight:      204 pounds Temp:     97.6 degrees F Pulse rate:   88 / minute BP sitting:   140 / 90  (left arm) Cuff size:   large  Vitals Entered By: Pura Spice, RN (Aug 08, 2007 3:06 PM)                 Chief Complaint:  wants hbg ck'd refused to have done capillary wanted it done only by veinpuncture. Marland Kitchen  History of Present Illness: feels tired and fatigued and desires to nhave hg checked To request gyn to send copy of past lab studies saw  Dr. [name] and relates doing well GI wisw, no labs started on align desires refills of medications told  she needs cpx and to schedule takes neurontin for neck pain, prscribed by Dr. Jeral Fruit no other complaints                      Review of Systems      See HPI   Physical Exam  General:     Well-developed,well-nourished,in no acute distress; alert,appropriate and cooperative throughout examinationoverweight-appearing.   Eyes:     No corneal or conjunctival inflammation noted. EOMI. Perrla. Funduscopic exam benign, without hemorrhages, exudates or papilledema. Vision grossly normal. Ears:     External ear exam shows no significant lesions or deformities.  Otoscopic examination reveals clear canals, tympanic membranes are intact bilaterally without bulging, retraction, inflammation or discharge. Hearing is grossly normal bilaterally. Nose:     External nasal examination shows no deformity or inflammation. Nasal mucosa are pink and moist without lesions or exudates. Mouth:     Oral mucosa and oropharynx without lesions or exudates.  Teeth in good repair. Lungs:     Normal respiratory effort, chest expands symmetrically. Lungs are clear to auscultation, no crackles or wheezes. Heart:     Normal rate and regular rhythm. S1 and S2 normal without gallop, murmur, click, rub or other extra sounds. Abdomen:     gasric bypas  scarnon-tender, no abdominal hernia, no hepatomegaly, and no splenomegaly.   Rectal:     NE Genitalia:     NE    Impression & Recommendations:  Problem # 1:  ANEMIA, CHRONIC (ICD-281.9) Assessment: Unchanged  Problem # 2:  FATIGUE (ICD-780.79) Assessment: Unchanged  Problem # 3:  BARIATRIC SURGERY STATUS (ICD-V45.86) Assessment: Unchanged  Problem # 4:  ANXIETY (ICD-300.00) Assessment: Unchanged  Her updated medication list for this problem includes:    Amitriptyline Hcl 100 Mg Tabs (Amitriptyline hcl) .Marland Kitchen... Take 1 at bedtime    Alprazolam 1 Mg Tb24 (Alprazolam) .Marland Kitchen... 1 hs    Hydroxyzine Hcl 25 Mg Tabs (Hydroxyzine hcl) .Marland Kitchen... 1 by mouth three times a day as needed itching   Complete Medication List: 1)  Amitriptyline Hcl 100 Mg Tabs (Amitriptyline hcl) .... Take 1 at bedtime 2)  Potassium Chloride Crys Cr 20 Meq Tbcr (Potassium chloride crys cr) .Marland Kitchen.. 1 qd 3)  Alprazolam 1 Mg Tb24 (Alprazolam) .Marland Kitchen.. 1 hs 4)  Pyridium 200 Mg Tabs (Phenazopyridine hcl) .Marland Kitchen.. 1 three times a day 5)  Prilosec 40 Mg Cpdr (Omeprazole) .Marland Kitchen.. 1 once daily 6)  Neurontin 300 Mg Caps (Gabapentin) .Marland Kitchen.. 1 hs 7)  Ambien 10 Mg Tabs (Zolpidem tartrate) .Marland Kitchen.. 1 hs 8)  Maxzide-25 37.5-25 Mg Tabs (Triamterene-hctz) .Marland Kitchen.. 1 once daily as needed for edema  9)  Hydroxyzine Hcl 25 Mg Tabs (Hydroxyzine hcl) .Marland Kitchen.. 1 by mouth three times a day as needed itching 10)  Hemocyte Plus 106-1 Mg Caps (B complex-c-min-fe-fa) .... Take 1 by mouth once daily  Other Orders: Hgb (16109) Venipuncture (60454) UA Dipstick w/o Micro (automated)  (81003) TLB-CBC Platelet - w/Differential (85025-CBCD)   Patient Instructions: 1)  to have hgb checked  2)  continue other medications 3)  return for complete examination 4)  refilled rx   Prescriptions: HEMOCYTE PLUS 106-1 MG  CAPS (B COMPLEX-C-MIN-FE-FA) take 1 by mouth once daily  #34 x 11   Entered by:   Pura Spice, RN   Authorized by:   Judithann Sheen MD   Signed by:    Pura Spice, RN on 08/21/2007   Method used:   Printed then faxed to ...       [facility]  [address] [id number]       [address]       [place], [place]  [address]       Ph: [phone] or [phone]       Fax: [phone]   RxID:   [id number] HYDROXYZINE HCL 25 MG  TABS (HYDROXYZINE HCL) 1 by mouth three times a day as needed itching  #90 x 5   Entered by:   Pura Spice, RN   Authorized by:   Judithann Sheen MD   Signed by:   Pura Spice, RN on 08/08/2007   Method used:   Electronically sent to ...       [facility]  [address] [id number]       [address]       [place], [place]  [address]       Ph: [phone] or [phone]       Fax: [phone]   RxID:   [id number] MAXZIDE-25 37.5-25 MG  TABS (TRIAMTERENE-HCTZ) 1 once daily as needed for edema  #30 x 11   Entered and Authorized by:   Judithann Sheen MD   Signed by:   Judithann Sheen MD on 08/08/2007   Method used:   Electronically sent to ...       [facility]  [address] [id number]       [address]       [place], [place]  [address]       Ph: [phone] or [phone]       Fax: [phone]   RxID:   [id number] AMITRIPTYLINE HCL 100 MG  TABS (AMITRIPTYLINE HCL) take 1 at bedtime  #30 x 11   Entered and Authorized by:   Judithann Sheen MD   Signed by:   Judithann Sheen MD on 08/08/2007   Method used:   Electronically sent to ...       [facility]  [address] [id number]       [address]       [place], [place]  [address]       Ph: [phone] or [phone]       Fax: [phone]   RxID:   [id number] POTASSIUM CHLORIDE CRYS CR 20 MEQ  TBCR (POTASSIUM CHLORIDE CRYS CR) 1 qd  #30 x 11   Entered and Authorized by:   Ivar Drape  Danice Goltz MD   Signed by:   Judithann Sheen MD on 08/08/2007   Method used:   Electronically sent to ...       [facility]  [address] [id number]       [address]       [place], [place]  [address]       Ph: [phone] or [phone]       Fax: [phone]   RxID:   [id number] HYDRALAZINE HCL 25 MG  TABS (HYDRALAZINE HCL) 1 6id as needed itching  #90 x 11   Entered and Authorized by:   Judithann Sheen MD   Signed by:   Judithann Sheen MD on 08/08/2007   Method used:   Electronically sent to ...       [facility]  [address] [id number]       [address]       [place], [place]  [address]       Ph: [phone] or [phone]       Fax: [phone]   RxID:   [id number] AMBIEN 10 MG  TABS (ZOLPIDEM TARTRATE) 1 hs  #30 x 5   Entered and Authorized by:   Judithann Sheen MD   Signed by:   Judithann Sheen MD on 08/08/2007   Method used:   Print then Give to Patient   RxID:   [id number] NEURONTIN 300 MG  CAPS (GABAPENTIN) 1 hs  #30 x 11   Entered and Authorized by:   Judithann Sheen MD   Signed by:   Judithann Sheen MD on 08/08/2007   Method used:   Electronically sent to ...       [facility]  [address] [id number]       [address]       [place], [place]  [address]       Ph: [phone] or [phone]       Fax: [phone]   RxID:   [id number] PRILOSEC 40 MG  CPDR (OMEPRAZOLE) 1 once daily  #30 x 11   Entered and Authorized by:   Judithann Sheen MD   Signed by:   Judithann Sheen MD on 08/08/2007   Method used:   Electronically sent to ...       [facility]  [address] [id number]       [address]       [place], [place]  [address]       Ph: [phone] or [phone]       Fax: [phone]   RxID:   [id number] PYRIDIUM 200 MG  TABS (PHENAZOPYRIDINE HCL) 1 three times a day  #90 x 11   Entered and Authorized by:   Judithann Sheen MD   Signed by:   Judithann Sheen MD on 08/08/2007   Method used:   Print then Give to Patient   RxID:    [id number] ALPRAZOLAM 1 MG  TB24 (ALPRAZOLAM) 1 hs  #30 x 5   Entered and Authorized by:   Judithann Sheen MD   Signed by:   Judithann Sheen MD on 08/08/2007   Method used:   Print then Give to Patient  RxID:   [id number]  ] Laboratory Results   Urine Tests    Routine Urinalysis   Color: yellow Appearance: Clear Glucose: negative   (Normal Range: Negative) Bilirubin: negative   (Normal Range: Negative) Ketone: negative   (Normal Range: Negative) Spec. Gravity: <1.005   (Normal Range: 1.003-1.035) Blood: negative   (Normal Range: Negative) pH: 5.0   (Normal Range: 5.0-8.0) Protein: negative   (Normal Range: Negative) Urobilinogen: 0.2   (Normal Range: 0-1) Nitrite: negative   (Normal Range: Negative) Leukocyte Esterace: negative   (Normal Range: Negative)    Comments: Milica Zimonjic  Aug 08, 2007 3:07 PM      Laboratory Results   Urine Tests    Routine Urinalysis   Color: yellow Appearance: Clear Glucose: negative   (Normal Range: Negative) Bilirubin: negative   (Normal Range: Negative) Ketone: negative   (Normal Range: Negative) Spec. Gravity: <1.005   (Normal Range: 1.003-1.035) Blood: negative   (Normal Range: Negative) pH: 5.0   (Normal Range: 5.0-8.0) Protein: negative   (Normal Range: Negative) Urobilinogen: 0.2   (Normal Range: 0-1) Nitrite: negative   (Normal Range: Negative) Leukocyte Esterace: negative   (Normal Range: Negative)    Comments: Milica Zimonjic  Aug 08, 2007 3:07 PM

## 2010-04-26 NOTE — Progress Notes (Signed)
Summary: lab results  Phone Note Call from Patient Call back at Work Phone [phone]   Caller: Patient Call For: Judithann Sheen MD Summary of Call: pt needs labworks Initial call taken by: Heron Sabins,  March 17, 2009 3:02 PM  Follow-up for Phone Call        urine negative  pt aware.

## 2010-04-26 NOTE — Progress Notes (Signed)
Summary: something called in per Dr Scotty Court   Phone Note Call from Patient Call back at [phone]   Caller: pt live Call For: [facility] Summary of Call: She has head and chest congestion and has yellow green phlegm.  She has no voice and chest is tight and she is barely coughing.  Would like something called in to [facility] [address]  She also needs to know about the referral to a urologist.   Initial call taken by: Roselle Locus,  April 16, 2008 2:39 PM  Follow-up for Phone Call        to have pt come in

## 2010-04-26 NOTE — Medication Information (Signed)
Summary: Prior Authorization Request & Approval for Zolpidem Tartrate/Med  Prior Authorization Request & Approval for Zolpidem Tartrate/[facility]   Imported By: Maryln Gottron 04/30/2008 14:59:57  _____________________________________________________________________  External Attachment:    Type:   Image     Comment:   External Document

## 2010-04-26 NOTE — Progress Notes (Signed)
Summary: Should she take B12  Phone Note Call from Patient Call back at Home Phone [phone] Call back at Work Phone [phone]   Call For: Dr Juanda Chance Summary of Call: Wonders if she should take her B12 is feeling tired and her hemoglobin is very low. Initial call taken by: Leanor Kail [facility],  February 04, 2009 12:27 PM  Follow-up for Phone Call        Dr Juanda Chance- Patient's last hemoglobin drawn on 02/02/09 was 10.9 which you said is below her normal. Her last B12 was drawn on 12/22/08 and was on the lower limits of normal at 235. Patient comments that she is extremely fatigued. Do you think that her fatigue is related to a combination of lower B12 levels and low iron or do you have other thoughts of what to do?? Follow-up by: Hortense Ramal CMA [facility]),  February 04, 2009 4:31 PM  Additional Follow-up for Phone Call Additional follow up Details #1::        It is OK to give her B12 1000ug IM now and monthly x 6 , then repeat.  Additional Follow-up by: Hart Carwin MD,  February 04, 2009 7:53 PM    Additional Follow-up for Phone Call Additional follow up Details #2::    Patient states that she has b12 at home (she is an [employer] fundraiser) so she will give herself an injection once monthly x 6 months. She has been entered in [facility] to have repeat b12 levels drawn week of Aug 08, 2009. Patient aware she is to come for this. Follow-up by: Hortense Ramal CMA [facility]),  February 05, 2009 10:55 AM

## 2010-04-26 NOTE — Letter (Signed)
Summary: [facility]  [facility]   Imported By: Sherian Rein 07/27/2008 08:33:31  _____________________________________________________________________  External Attachment:    Type:   Image     Comment:   External Document

## 2010-04-26 NOTE — Assessment & Plan Note (Signed)
Summary: uti/cjr   Vital Signs:  Patient profile:   77 year old female Temp:     98.1 degrees F oral BP sitting:   110 / 72  Vitals Entered By: Lynann Beaver CMA (April 27, 2009 5:05 PM) CC: UTI Is Patient Diabetic? No Pain Assessment Patient in pain? no        Current Medications (verified): 1)  Fexofenadine Hcl 180 Mg Tabs (Fexofenadine Hcl) .... Take 1 Tablet By Mouth Once A Day 2)  Flonase 50 Mcg/act Susp (Fluticasone Propionate) .... Spray 1 Spray Into Both Nostrils Once A Day 3)  Bl Vitamin E 400 Unit  Caps (Vitamin E) .... Once Daily 4)  Advair Diskus 250-50 Mcg/dose  Misc (Fluticasone-Salmeterol) .... Uses As Needed 5)  Omeprazole 20 Mg  Tbec (Omeprazole) .Marland Kitchen.. 1 By Mouth Once Daily 6)  Valtrex 500 Mg  Tabs (Valacyclovir Hcl) .... Prn 7)  Amitiza 24 Mcg  Caps (Lubiprostone) .... Once Daily 8)  Mag-Ox 400 400 Mg  Tabs (Magnesium Oxide) .... Two Once Daily 9)  Ambien 10 Mg  Tabs (Zolpidem Tartrate) .... On By Mouth Q Hs As Needed 10)  Amitriptyline Hcl 100 Mg  Tabs (Amitriptyline Hcl) .... Take 1 At Bedtime 11)  Potassium Chloride Crys Cr 20 Meq  Tbcr (Potassium Chloride Crys Cr) .Marland Kitchen.. 1 Qd 12)  Pyridium 200 Mg  Tabs (Phenazopyridine Hcl) .Marland Kitchen.. 1 Three Times A Day 13)  Maxzide-25 37.5-25 Mg  Tabs (Triamterene-Hctz) .Marland Kitchen.. 1 Once Daily As Needed For Edema 14)  Hydroxyzine Hcl 25 Mg  Tabs (Hydroxyzine Hcl) .Marland Kitchen.. 1 By Mouth Three Times A Day As Needed Itching 15)  Hemocyte Plus 106-1 Mg  Caps (B Complex-C-Min-Fe-Fa) .... Take 1 By Mouth Two Times A Day 16)  Cyanocobalamin 1000 Mcg/ml Inj Soln (Cyanocobalamin) .... Inject 1 Ml Im Once A Week For 4 Weeks Then Inject 1 Ml Once A Month 17)  Flexeril 10 Mg Tabs (Cyclobenzaprine Hcl) .Marland Kitchen.. 1 Three Times A Day For Muscle Spasm 18)  Alprazolam 1 Mg Tabs (Alprazolam) .Marland Kitchen.. 1 Three Times A Day As Needed Stress Needs Office Visit 19)  Optivar 0.05 % Soln (Azelastine Hcl) .Marland Kitchen.. 1 Drop  Each  Eye Bid 20)  Lyrica 50 Mg Caps (Pregabalin) .Marland Kitchen.. 1  Two Times A Day 21)  Prednisone 10 Mg  Tabs (Prednisone) .... Take 3 (30mg ) By Mouth The Day Before Iron Infusion. 22)  Klor-Con M20 20 Meq Cr-Tabs (Potassium Chloride Crys Cr) .Marland Kitchen.. 1 By Mouth Once Daily  Needs Office Visit 23)  Bentyl 20 Mg Tabs (Dicyclomine Hcl) .... Take One By Mouth 3 Times Daily As Needed 24)  Levaquin 750 Mg Tabs (Levofloxacin) .Marland Kitchen.. 1 Once Daily For Infection 25)  Nabumetone 750 Mg Tabs (Nabumetone) .Marland Kitchen.. 1 By Mouth Two Times A Day 26)  Tramadol Hcl 50 Mg Tabs (Tramadol Hcl) .Marland Kitchen.. 1 or 2 Evry 4 -6 Hrs As Needed For Back Pain  Allergies (verified): 1)  ! Demerol 2)  ! Hydrocodone 3)  ! Ciprofloxacin Hcl (Ciprofloxacin Hcl) 4)  Codeine Phosphate (Codeine Phosphate) 5)  Hydrocodone-Homatropine (Hydrocodone-Homatropine) 6)  Sulfamethoxazole (Sulfamethoxazole) 7)  Zithromax (Azithromycin)   Complete Medication List: 1)  Fexofenadine Hcl 180 Mg Tabs (Fexofenadine hcl) .... Take 1 tablet by mouth once a day 2)  Flonase 50 Mcg/act Susp (Fluticasone propionate) .... Spray 1 spray into both nostrils once a day 3)  Bl Vitamin E 400 Unit Caps (Vitamin e) .... Once daily 4)  Advair Diskus 250-50 Mcg/dose Misc (Fluticasone-salmeterol) .... Uses as  needed 5)  Omeprazole 20 Mg Tbec (Omeprazole) .Marland Kitchen.. 1 by mouth once daily 6)  Valtrex 500 Mg Tabs (Valacyclovir hcl) .... Prn 7)  Amitiza 24 Mcg Caps (Lubiprostone) .... Once daily 8)  Mag-ox 400 400 Mg Tabs (Magnesium oxide) .... Two once daily 9)  Ambien 10 Mg Tabs (Zolpidem tartrate) .... On by mouth q hs as needed 10)  Amitriptyline Hcl 100 Mg Tabs (Amitriptyline hcl) .... Take 1 at bedtime 11)  Potassium Chloride Crys Cr 20 Meq Tbcr (Potassium chloride crys cr) .Marland Kitchen.. 1 qd 12)  Pyridium 200 Mg Tabs (Phenazopyridine hcl) .Marland Kitchen.. 1 three times a day 13)  Maxzide-25 37.5-25 Mg Tabs (Triamterene-hctz) .Marland Kitchen.. 1 once daily as needed for edema 14)  Hydroxyzine Hcl 25 Mg Tabs (Hydroxyzine hcl) .Marland Kitchen.. 1 by mouth three times a day as needed  itching 15)  Hemocyte Plus 106-1 Mg Caps (B complex-c-min-fe-fa) .... Take 1 by mouth two times a day 16)  Cyanocobalamin 1000 Mcg/ml Inj Soln (Cyanocobalamin) .... Inject 1 ml im once a week for 4 weeks then inject 1 ml once a month 17)  Flexeril 10 Mg Tabs (Cyclobenzaprine hcl) .Marland Kitchen.. 1 three times a day for muscle spasm 18)  Alprazolam 1 Mg Tabs (Alprazolam) .Marland Kitchen.. 1 three times a day as needed stress needs office visit 19)  Optivar 0.05 % Soln (Azelastine hcl) .Marland Kitchen.. 1 drop  each  eye bid 20)  Lyrica 50 Mg Caps (Pregabalin) .Marland Kitchen.. 1 two times a day 21)  Prednisone 10 Mg Tabs (Prednisone) .... Take 3 (30mg ) by mouth the day before iron infusion. 22)  Klor-con M20 20 Meq Cr-tabs (Potassium chloride crys cr) .Marland Kitchen.. 1 by mouth once daily  needs office visit 23)  Bentyl 20 Mg Tabs (Dicyclomine hcl) .... Take one by mouth 3 times daily as needed 24)  Levaquin 750 Mg Tabs (Levofloxacin) .Marland Kitchen.. 1 once daily for infection 25)  Nabumetone 750 Mg Tabs (Nabumetone) .Marland Kitchen.. 1 by mouth two times a day 26)  Tramadol Hcl 50 Mg Tabs (Tramadol hcl) .Marland Kitchen.. 1 or 2 evry 4 -6 hrs as needed for back pain 27)  Septra Ds 800-160 Mg Tabs (Sulfamethoxazole-trimethoprim) .Marland Kitchen.. 1 two times a day for uti Prescriptions: SEPTRA DS 800-160 MG TABS (SULFAMETHOXAZOLE-TRIMETHOPRIM) 1 two times a day for UTI  #30 x 1   Entered and Authorized by:   Judithann Sheen MD   Signed by:   Judithann Sheen MD on 04/27/2009   Method used:   [facility] to        [facility]  [address] [id number]       [address]       [place], [place]  [address]       Ph: [phone] or [phone]       Fax: [phone]   RxID:   [id number]   Laboratory Results   Urine Tests    Routine Urinalysis   Color: yellow Appearance: Clear Glucose: negative   (Normal Range: Negative) Bilirubin: negative   (Normal Range: Negative) Ketone: negative   (Normal Range: Negative) Spec. Gravity: 1.010   (Normal Range:  1.003-1.035) Blood: 2+   (Normal Range: Negative) pH: 5.5   (Normal Range: 5.0-8.0) Protein: 1+   (Normal Range: Negative) Urobilinogen: 0.2   (Normal Range: 0-1) Nitrite: negative   (Normal Range: Negative) Leukocyte Esterace: 3+   (Normal Range: Negative)    Comments: Rita Ohara  April 27, 2009 4:29 PM  Appended Document: uti/cjr     History of Present Illness: This patient has had general malaise, dysuria and urinary frequency, with hx of recurrent UTI, had been nonrespoce to cipro in last infection In process of evaluatong possible abscess of spine, being seen by Neurosurgeon BP controlled No other complaints  Allergies: 1)  ! Demerol 2)  ! Hydrocodone 3)  ! Ciprofloxacin Hcl (Ciprofloxacin Hcl) 4)  Codeine Phosphate (Codeine Phosphate) 5)  Hydrocodone-Homatropine (Hydrocodone-Homatropine) 6)  Zithromax (Azithromycin)  Past History:  Past Medical History: Last updated: 07/09/2008 Asthma Obesity DIVERTICULOSIS, COLON (ICD-562.10) Hx of ESOPHAGEAL STRICTURE (ICD-530.3) VITAMIN B12 DEFICIENCY (ICD-266.2) COLONIC POLYPS, ADENOMATOUS, HX OF (ICD-V12.72) ANEMIA (ICD-285.9) ACUTE PHARYNGITIS (ICD-462) LARYNGITIS, ACUTE (ICD-464.00) NUMBNESS (ICD-782.0) UTI (ICD-599.0) ENTERITIS (ICD-558.9) URINARY URGENCY (ICD-788.63) BACK PAIN (ICD-724.5) ANXIETY (ICD-300.00) BARIATRIC SURGERY STATUS (ICD-V45.86) FATIGUE (ICD-780.79) INSOMNIA (ICD-780.52) ANEMIA, CHRONIC (ICD-281.9) CYSTITIS, ACUTE (ICD-595.0) BACK PAIN (ICD-724.5) GASTROINTESTINAL HEMORRHAGE, HX OF (ICD-V12.79) ASTHMA (ICD-493.90) ALLERGIC RHINITIS (ICD-477.9)  Past Surgical History: Last updated: 07/09/2008 Cholecystectomy [address]  Tonsillectomy Back surgery-ruptured disk  Social History: Last updated: 08/04/2008 Illicit Drug Use - no Patient has never smoked.  Alcohol Use - no Daily Caffeine Use  diet coke  and mt dew some coffee Occupation: [employer] fund[employer]  Risk  Factors: Smoking Status: never (05/18/2009) Packs/Day: <0.25 (05/05/2009)  Review of Systems  The patient denies anorexia, fever, weight loss, weight gain, vision loss, decreased hearing, hoarseness, chest pain, syncope, dyspnea on exertion, peripheral edema, prolonged cough, headaches, hemoptysis, abdominal pain, melena, hematochezia, severe indigestion/heartburn, hematuria, incontinence, genital sores, muscle weakness, suspicious skin lesions, transient blindness, difficulty walking, depression, unusual weight change, abnormal bleeding, enlarged lymph nodes, angioedema, breast masses, and testicular masses.    Physical Exam  General:  Well-developed,well-nourished,in no acute distress; alert,appropriate and cooperative throughout examination Lungs:  Normal respiratory effort, chest expands symmetrically. Lungs are clear to auscultation, no crackles or wheezes. Heart:  Normal rate and regular rhythm. S1 and S2 normal without gallop, murmur, click, rub or other extra sounds. Abdomen:  suprpubic tenderness, no cva tenderness Genitalia:  Not examined   Impression & Recommendations:  Problem # 1:  UTI (ICD-599.0) Assessment New  Levaquqin on 2/1 500mg  by mouth x 10 Her updated medication list for this problem includes:    Vancomycin Hcl in Dextrose 500 Mg/180ml Soln (Vancomycin hcl in dextrose)    Ceftriaxone Sodium 1 Gm Solr (Ceftriaxone sodium) ..... Once daily ivpb  Orders: Prescription Created Electronically [phone])  Problem # 2:  OSTEOMYELITIS, VERTEBRA (ICD-730.28) Assessment: Unchanged  Problem # 3:  DISC DISEASE, LUMBOSACRAL SPINE (ICD-722.52) Assessment: Unchanged  Problem # 4:  HYPERTENSION (ICD-401.9) Assessment: Improved  Complete Medication List: 1)  Fexofenadine Hcl 180 Mg Tabs (Fexofenadine hcl) .... Take 1 tablet by mouth once a day 2)  Flonase 50 Mcg/act Susp (Fluticasone propionate) .... Spray 1 spray into both nostrils once a day 3)  Bl Vitamin E 400 Unit  Caps (Vitamin e) .... Once daily 4)  Advair Diskus 250-50 Mcg/dose Misc (Fluticasone-salmeterol) .... Uses as needed 5)  Omeprazole 20 Mg Tbec (Omeprazole) .Marland Kitchen.. 1 by mouth once daily 6)  Valtrex 500 Mg Tabs (Valacyclovir hcl) .... Prn 7)  Amitiza 24 Mcg Caps (Lubiprostone) .... Once daily 8)  Mag-ox 400 400 Mg Tabs (Magnesium oxide) .... Two once daily 9)  Ambien 10 Mg Tabs (Zolpidem tartrate) .... On by mouth q hs as needed 10)  Amitriptyline Hcl 100 Mg Tabs (Amitriptyline hcl) .... Take 1 at bedtime 11)  Potassium Chloride Crys Cr 20 Meq Tbcr (Potassium  chloride crys cr) .Marland Kitchen.. 1 qd 12)  Hydroxyzine Hcl 25 Mg Tabs (Hydroxyzine hcl) .Marland Kitchen.. 1 by mouth three times a day as needed itching 13)  Hemocyte Plus 106-1 Mg Caps (B complex-c-min-fe-fa) .... Take 1 by mouth two times a day 14)  Cyanocobalamin 1000 Mcg/ml Inj Soln (Cyanocobalamin) .... Inject 1 ml im once a week for 4 weeks then inject 1 ml once a month 15)  Flexeril 10 Mg Tabs (Cyclobenzaprine hcl) .Marland Kitchen.. 1 three times a day for muscle spasm 16)  Alprazolam 1 Mg Tabs (Alprazolam) .Marland Kitchen.. 1 three times a day as needed stress needs office visit 17)  Optivar 0.05 % Soln (Azelastine hcl) .Marland Kitchen.. 1 drop  each  eye bid 18)  Lyrica 50 Mg Caps (Pregabalin) .Marland Kitchen.. 1 two times a day 19)  Klor-con M20 20 Meq Cr-tabs (Potassium chloride crys cr) .Marland Kitchen.. 1 by mouth once daily  needs office visit 20)  Bentyl 20 Mg Tabs (Dicyclomine hcl) .... Take one by mouth 3 times daily as needed 21)  Nabumetone 750 Mg Tabs (Nabumetone) .Marland Kitchen.. 1 by mouth two times a day 22)  Vancomycin Hcl in Dextrose 500 Mg/17ml Soln (Vancomycin hcl in dextrose) 23)  Ceftriaxone Sodium 1 Gm Solr (Ceftriaxone sodium) .... Once daily ivpb  Patient Instructions: 1)  Take  Levaquin until finished return for repeat urinalysis and possible culture

## 2010-04-26 NOTE — Progress Notes (Signed)
Summary: TRIAGE--Iron infusion  Phone Note Outgoing Call   Call placed by: [name] LPN,  August 26, 1608 12:32 PM Call placed to: Patient Summary of Call: Pt. scheduled for an Iron infusion at [facility] on 09-07-08 at 9am. Pt. has 4 questions: 1) She takes Hemocyte Plus 1 daily, should she be taking more? 2) When does she need repeat labs? 3) When does she need an office visit? 4) Does she need to be pre-medicated for this iron infusion? (she was given Prednisone 30mg  by mouth the day before and on the day of she was given Solumedrol 20mg  IV,Benadryl 25mg  IV and Tylenol 650mg  #2 by mouth.)  DR.BRODIE PLEASE ADVISE Initial call taken by: [name] LPN,  August 27, 9602 12:37 PM  Follow-up for Phone Call        1) May increase iron to bid,unless it is making her sick. 2) Recheck CBC and Iron studies 4-6 weeks following infusion.  3) OV 3 months 4) premedicate ,same as before.  Follow-up by: Hart Carwin,  August 26, 2008 12:48 PM  Additional Follow-up for Phone Call Additional follow up Details #1::        Above MD orders reviewed with patient. Script to pharmacy for Iron and Prednisone. Orders faxed to [facility] for Iron infusion and pre-medication. Pt. will have labs rechecked the week of 10-12-08 and REV with Dr.Brodie on 11-26-08 at 11am. Pt. instructed to call back as needed.   Additional Follow-up by: [name] LPN,  August 27, 5407 1:15 PM    New/Updated Medications: PREDNISONE 10 MG  TABS (PREDNISONE) Take 3 (30mg ) by mouth the day before Iron Infusion.   Prescriptions: PREDNISONE 10 MG  TABS (PREDNISONE) Take 3 (30mg ) by mouth the day before Iron Infusion.  #21 x 0   Entered by:   [name] LPN   Authorized by:   Hart Carwin   Signed by:   [name] LPN on 81/19/1478   Method used:   Electronically to        [facility]  [address] # [id number]* (retail)       [address]       [place], [place]  [address]       Ph: [phone] or  [phone]       Fax: [phone]   RxID:   [id number] HEMOCYTE PLUS 106-1 MG  CAPS (B COMPLEX-C-MIN-FE-FA) take 1 by mouth two times a day  #60 x 10   Entered by:   [name] LPN   Authorized by:   Hart Carwin   Signed by:   [name] LPN on 87/56/4332   Method used:   Electronically to        [facility]  [address] # [id number]* (retail)       [address]       [place], [place]  [address]       Ph: [phone] or [phone]       Fax: [phone]   RxID:   [id number]

## 2010-04-26 NOTE — Progress Notes (Signed)
Summary: hemocyte plus w 11 refills  Phone Note From Pharmacy   Caller: [facility]  [address] # [id number]* Reason for Call: Needs renewal Summary of Call: needs rx hemocyte plus  Initial call taken by: Pura Spice, RN,  Aug 21, 2007 2:55 PM  Follow-up for Phone Call        [facility] to [facility] [address] Follow-up by: Pura Spice, RN,  Aug 21, 2007 2:58 PM

## 2010-04-26 NOTE — Progress Notes (Signed)
Summary: pt coming for ua culture today   Phone Note Call from Patient   Caller: Patient Summary of Call: wants resulrts of urinalysis Initial call taken by: Pura Spice, RN,  February 26, 2008 12:32 PM  Follow-up for Phone Call         spoke with pt per dr Scotty Court needs another spec to culture.  pt stated will come this evening  Follow-up by: Pura Spice, RN,  February 26, 2008 12:32 PM  New Problems: UTI (ICD-599.0)   New Problems: UTI (ICD-599.0)

## 2010-04-26 NOTE — Progress Notes (Signed)
Summary: TRIAGE  Phone Note Call from Patient Call back at Home Phone [phone] Call back at or [phone]   Call For: DR Juanda Chance Reason for Call: Talk to Nurse Summary of Call: Is feeling really tired and wonders if we can put an order in computer so she can get labs done to check her blood count. Initial call taken by: Leanor Kail [facility],  June 05, 2008 2:08 PM  Follow-up for Phone Call        Pt. states she has increased  fatigue. Denies black stools,blood, abd. pain, any other GI c/o. Pt. advised to see her PCP for evaluation,but insists that "Verlee Monte" needs to take care of this. DR.BRODIE PLEASE ADVISE Follow-up by: [name] LPN,  June 05, 2008 2:23 PM  Additional Follow-up for Phone Call Additional follow up Details #1::        I agree that I need to take care of it, since she has been having chronic GIB, LOV 2008. Please have pt come for CBC, B12, Fe TIBC, sed.rate.  Additional Follow-up by: Hart Carwin,  June 05, 2008 4:50 PM    Additional Follow-up for Phone Call Additional follow up Details #2::    Pt. advised of above MD orders. Pt. states she will come on Monday for labs.Pt. instructed to call back as needed.  Follow-up by: [name] LPN,  June 05, 2008 4:53 PM

## 2010-04-26 NOTE — Assessment & Plan Note (Signed)
Summary: 2WK F/U/VS   Referring Provider:  n/a Primary Provider:  [name]  CC:  2 week follow up.  History of Present Illness: 77 yo F with a hx of L2-L3 discectomy 1994. In 2009 she had "nerve injections" into her back due to back pain.  She has had worsening back pain recently (over last 3 and 1/2 weeks)  and underwent MRI (Apr 28, 2009) showing 1.  Significant marrow signal changes and abnormal enhancement about the L1-2 disc space, predominately in the inferior aspect of L1 with erosion into the inferior endplates.  This is most concerning for disc osteomyelitis. Reactive changes and a large Schmorl's node without infection are considered less likely. 2.  Abnormal soft tissue within the left lateral recess posterior to the L1 vertebral body.  This is most compatible with a small epidural abscess resulting in left lateral recess narrowing and potentially affecting the left L1 and L2 nerve roots.  Alternately, this could represent extruded disc material.  The latter explanation does not explain the peripheral enhancement. 3.  Abnormal enhancement of the paraspinal soft tissues about L1-2 also favors infection. 4.  Progressive retrolisthesis at L1-2 contributes to the lateral recess narrowing. 5.  Spondylosis of the lumbar spine at the L2-3 level and below is stable. Had WBC of 7.8 (05-13-09). She underwent aspiration of L1-2 (2-17) no WBC, few SE, no organisms, Cx negative. Believes she may have had low grade temp, has chills in her legs at night. attributes this to her low hgb.  Still has back pain. Worse at night, worse with bending over.   Preventive Screening-Counseling & Management  Alcohol-Tobacco     Alcohol drinks/day: 0     Smoking Status: never  Caffeine-Diet-Exercise     Caffeine use/day: sodas     Does Patient Exercise: no  Safety-Violence-Falls     Seat Belt Use: yes   Updated Prior Medication List: FEXOFENADINE HCL 180 MG TABS (FEXOFENADINE HCL)  Take 1 tablet by mouth once a day FLONASE 50 MCG/ACT SUSP (FLUTICASONE PROPIONATE) Spray 1 spray into both nostrils once a day BL VITAMIN E 400 UNIT  CAPS (VITAMIN E) once daily ADVAIR DISKUS 250-50 MCG/DOSE  MISC (FLUTICASONE-SALMETEROL) uses as needed OMEPRAZOLE 20 MG  TBEC (OMEPRAZOLE) 1 by mouth once daily VALTREX 500 MG  TABS (VALACYCLOVIR HCL) prn AMITIZA 24 MCG  CAPS (LUBIPROSTONE) once daily MAG-OX 400 400 MG  TABS (MAGNESIUM OXIDE) two once daily AMBIEN 10 MG  TABS (ZOLPIDEM TARTRATE) on by mouth q hs as needed AMITRIPTYLINE HCL 100 MG  TABS (AMITRIPTYLINE HCL) take 1 at bedtime POTASSIUM CHLORIDE CRYS CR 20 MEQ  TBCR (POTASSIUM CHLORIDE CRYS CR) 1 qd HYDROXYZINE HCL 25 MG  TABS (HYDROXYZINE HCL) 1 by mouth three times a day as needed itching HEMOCYTE PLUS 106-1 MG  CAPS (B COMPLEX-C-MIN-FE-FA) take 1 by mouth two times a day CYANOCOBALAMIN 1000 MCG/ML INJ SOLN (CYANOCOBALAMIN) inject 1 ml IM once a week for 4 weeks then inject 1 ml once a month FLEXERIL 10 MG TABS (CYCLOBENZAPRINE HCL) 1 three times a day for muscle spasm ALPRAZOLAM 1 MG TABS (ALPRAZOLAM) 1 three times a day as needed stress needs office visit OPTIVAR 0.05 % SOLN (AZELASTINE HCL) 1 drop  each  eye bid LYRICA 50 MG CAPS (PREGABALIN) 1 two times a day KLOR-CON M20 20 MEQ CR-TABS (POTASSIUM CHLORIDE CRYS CR) 1 by mouth once daily  needs office visit BENTYL 20 MG TABS (DICYCLOMINE HCL) Take one by mouth 3 times daily as needed  NABUMETONE 750 MG TABS (NABUMETONE) 1 by mouth two times a day  Current Allergies (reviewed today): ! DEMEROL ! HYDROCODONE ! CIPROFLOXACIN HCL (CIPROFLOXACIN HCL) CODEINE PHOSPHATE (CODEINE PHOSPHATE) HYDROCODONE-HOMATROPINE (HYDROCODONE-HOMATROPINE) ZITHROMAX (AZITHROMYCIN) Past History:  Past medical, surgical, family and social histories (including risk factors) reviewed, and no changes noted (except as noted below).  Past Medical History: Reviewed history from 07/09/2008 and no  changes required. Asthma Obesity DIVERTICULOSIS, COLON (ICD-562.10) Hx of ESOPHAGEAL STRICTURE (ICD-530.3) VITAMIN B12 DEFICIENCY (ICD-266.2) COLONIC POLYPS, ADENOMATOUS, HX OF (ICD-V12.72) ANEMIA (ICD-285.9) ACUTE PHARYNGITIS (ICD-462) LARYNGITIS, ACUTE (ICD-464.00) NUMBNESS (ICD-782.0) UTI (ICD-599.0) ENTERITIS (ICD-558.9) URINARY URGENCY (ICD-788.63) BACK PAIN (ICD-724.5) ANXIETY (ICD-300.00) BARIATRIC SURGERY STATUS (ICD-V45.86) FATIGUE (ICD-780.79) INSOMNIA (ICD-780.52) ANEMIA, CHRONIC (ICD-281.9) CYSTITIS, ACUTE (ICD-595.0) BACK PAIN (ICD-724.5) GASTROINTESTINAL HEMORRHAGE, HX OF (ICD-V12.79) ASTHMA (ICD-493.90) ALLERGIC RHINITIS (ICD-477.9)  Past Surgical History: Reviewed history from 07/09/2008 and no changes required. Cholecystectomy Ileojejunal Bypass  Tonsillectomy Back surgery-ruptured disk  Current Medications (verified): 1)  Fexofenadine Hcl 180 Mg Tabs (Fexofenadine Hcl) .... Take 1 Tablet By Mouth Once A Day 2)  Flonase 50 Mcg/act Susp (Fluticasone Propionate) .... Spray 1 Spray Into Both Nostrils Once A Day 3)  Bl Vitamin E 400 Unit  Caps (Vitamin E) .... Once Daily 4)  Advair Diskus 250-50 Mcg/dose  Misc (Fluticasone-Salmeterol) .... Uses As Needed 5)  Omeprazole 20 Mg  Tbec (Omeprazole) .Marland Kitchen.. 1 By Mouth Once Daily 6)  Valtrex 500 Mg  Tabs (Valacyclovir Hcl) .... Prn 7)  Amitiza 24 Mcg  Caps (Lubiprostone) .... Once Daily 8)  Mag-Ox 400 400 Mg  Tabs (Magnesium Oxide) .... Two Once Daily 9)  Ambien 10 Mg  Tabs (Zolpidem Tartrate) .... On By Mouth Q Hs As Needed 10)  Amitriptyline Hcl 100 Mg  Tabs (Amitriptyline Hcl) .... Take 1 At Bedtime 11)  Potassium Chloride Crys Cr 20 Meq  Tbcr (Potassium Chloride Crys Cr) .Marland Kitchen.. 1 Qd 12)  Hydroxyzine Hcl 25 Mg  Tabs (Hydroxyzine Hcl) .Marland Kitchen.. 1 By Mouth Three Times A Day As Needed Itching 13)  Hemocyte Plus 106-1 Mg  Caps (B Complex-C-Min-Fe-Fa) .... Take 1 By Mouth Two Times A Day 14)  Cyanocobalamin 1000 Mcg/ml Inj  Soln (Cyanocobalamin) .... Inject 1 Ml Im Once A Week For 4 Weeks Then Inject 1 Ml Once A Month 15)  Flexeril 10 Mg Tabs (Cyclobenzaprine Hcl) .Marland Kitchen.. 1 Three Times A Day For Muscle Spasm 16)  Alprazolam 1 Mg Tabs (Alprazolam) .Marland Kitchen.. 1 Three Times A Day As Needed Stress Needs Office Visit 17)  Optivar 0.05 % Soln (Azelastine Hcl) .Marland Kitchen.. 1 Drop  Each  Eye Bid 18)  Lyrica 50 Mg Caps (Pregabalin) .Marland Kitchen.. 1 Two Times A Day 19)  Klor-Con M20 20 Meq Cr-Tabs (Potassium Chloride Crys Cr) .Marland Kitchen.. 1 By Mouth Once Daily  Needs Office Visit 20)  Bentyl 20 Mg Tabs (Dicyclomine Hcl) .... Take One By Mouth 3 Times Daily As Needed 21)  Nabumetone 750 Mg Tabs (Nabumetone) .Marland Kitchen.. 1 By Mouth Two Times A Day  Allergies: 1)  ! Demerol 2)  ! Hydrocodone 3)  ! Ciprofloxacin Hcl (Ciprofloxacin Hcl) 4)  Codeine Phosphate (Codeine Phosphate) 5)  Hydrocodone-Homatropine (Hydrocodone-Homatropine) 6)  Zithromax (Azithromycin)   Family History: Reviewed history from 05/05/2009 and no changes required. No FH of Colon Cancer: Family History of Liver Disease/Cirrhosis:Sister Family History of Kidney Disease: Sister Cervical Cancer: Mother husband is PPD+.   Social History: Reviewed history from 08/04/2008 and no changes required. Illicit Drug Use - no Patient has never smoked.  Alcohol Use -  no Daily Caffeine Use  diet coke  and mt dew some coffee Occupation: [employer]  Vital Signs:  Patient profile:   77 year old female Height:      65 inches (165.10 cm) Weight:      205.9 pounds (93.59 kg) BMI:     34.39 Temp:     97.1 degrees F (36.17 degrees C) oral Pulse rate:   98 / minute BP sitting:   129 / 79  (left arm)  Vitals Entered By: Baxter Hire) (May 18, 2009 2:51 PM)  CC: 2 week follow up Pain Assessment Patient in pain? no      Nutritional Status BMI of > 30 = obese Nutritional Status Detail appetite is good per patient  Does patient need assistance? Functional Status Self care Ambulation  Normal   Physical Exam  General:  well-developed, well-nourished, well-hydrated, and overweight-appearing.   Lungs:  normal respiratory effort and normal breath sounds.   Heart:  normal rate, regular rhythm, and no murmur.   Abdomen:  soft, non-tender, and normal bowel sounds.     Detailed Back/Spine Exam  General:    wound well healed, non-tender.    Impression & Recommendations:  Problem # 1:  OSTEOMYELITIS, VERTEBRA (ICD-730.28)  will set up pt for PIC line and 6 weeks of IV vanco and ceftriaxone. unfortunately, the sample was negative. will plan for repeat MRI at end of therapy. If no improvement, she may need more aggressive diagnostic procedure. she is worried that this is bone cancer, i reassured her that this is unlikely given her MRI findings.  return to clinic 6 weeks.  The following medications were removed from the medication list:    Tramadol Hcl 50 Mg Tabs (Tramadol hcl) .Marland Kitchen... 1 or 2 evry 4 -6 hrs as needed for back pain    Septra Ds 800-160 Mg Tabs (Sulfamethoxazole-trimethoprim) .Marland Kitchen... 1 two times a day for uti Her updated medication list for this problem includes:    Vancomycin Hcl in Dextrose 500 Mg/166ml Soln (Vancomycin hcl in dextrose)    Ceftriaxone Sodium 1 Gm Solr (Ceftriaxone sodium) ..... Once daily ivpb  Orders: Est. Patient Level IV ([address])  Medications Added to Medication List This Visit: 1)  Vancomycin Hcl in Dextrose 500 Mg/172ml Soln (Vancomycin hcl in dextrose) 2)  Ceftriaxone Sodium 1 Gm Solr (Ceftriaxone sodium) .... Once daily ivpb

## 2010-04-26 NOTE — Assessment & Plan Note (Signed)
Summary: spot on leg/yy/PT RESCD/CCM   Vital Signs:  Patient Profile:   77 Years Old Female Weight:      201.5 pounds Temp:     98.7 degrees F oral BP sitting:   118 / 76  (left arm)               Chief Complaint:  rov and cold sx x2weeks.  History of Present Illness: BaVesicare ck improved not on any medication but still bothers at night.Vesicare helps bladder.patient very much concerned about husband who has  prostate cancerpatient  has had recent cough of yellow sputum.Review of systems otherwise neg.  Current Allergies: CODEINE PHOSPHATE (CODEINE PHOSPHATE) HYDROCODONE-HOMATROPINE (HYDROCODONE-HOMATROPINE) SULFAMETHOXAZOLE (SULFAMETHOXAZOLE) ZITHROMAX (AZITHROMYCIN)     Review of Systems      See HPI   Physical Exam  Lungs:     minimal rhonchi with puralant sputum when coughing Heart:     Normal rate and regular rhythm. S1 and S2 normal without gallop, murmur, click, rub or other extra sounds. Abdomen:     Bowel sounds positive,abdomen soft and non-tender without masses, organomegaly or hernias noted. Msk:     minimal tenderness over S-I joints especially on right Pulses:     R and L carotid,radial,femoral,dorsalis pedis and posterior tibial pulses are full and equal bilaterally Extremities:     No clubbing, cyanosis, edema, or deformity noted with normal full range of motion of all joints.   Neurologic:     No cranial nerve deficits noted. Station and gait are normal. Plantar reflexes are down-going bilaterally. DTRs are symmetrical throughout. Sensory, motor and coordinative functions appear intact. Skin:     Intact without suspicious lesions or rashes Cervical Nodes:     No lymphadenopathy noted Axillary Nodes:     No palpable lymphadenopathy Inguinal Nodes:     No significant adenopathy Psych:     Cognition and judgment appear intact. Alert and cooperative with normal attention span and concentration. No apparent delusions, illusions,  hallucinations Additional Exam:     pty continues to be overweight and to attempt to lose    Impression & Recommendations:  Problem # 1:  BRONCHITIS-ACUTE (ICD-466.0) Assessment: New  levaquin 750mg  x 7 days The following medications were removed from the medication list:    Keflex 500 Mg Caps (Cephalexin) .Marland Kitchen... Take 1 capsule by mouth three times a day  Her updated medication list for this problem includes:    Advair Diskus 250-50 Mcg/dose Misc (Fluticasone-salmeterol) ..... Uses as needed    Levaquin 750 Mg Tabs (Levofloxacin) ..... Once daily   Problem # 2:  BACK PAIN (ICD-724.5) Assessment: Improved doing much better  Her updated medication list for this problem includes:    Darvocet-n 100 100-650 Mg Tabs (Propoxyphene n-apap) .Marland Kitchen... As needed   Complete Medication List: 1)  Alprazolam 0.5 Mg Tabs (Alprazolam) .... Pt takes one tab at bedtime 2)  Ambien 5 Mg Tabs (Zolpidem tartrate) .... Take 1 tablet by mouth at bedtime 3)  Amitriptyline Hcl 100 Mg Tabs (Amitriptyline hcl) .... Take 1 tablet by mouth at bedtime 4)  Fexofenadine Hcl 180 Mg Tabs (Fexofenadine hcl) .... Take 1 tablet by mouth once a day 5)  Flonase 50 Mcg/act Susp (Fluticasone propionate) .... Spray 1 spray into both nostrils once a day 6)  Phenazopyridine Hcl 200 Mg Tabs (Phenazopyridine hcl) .... Take 1 tablet by mouth twice a day 7)  Proferrin-forte 12-1 Mg Tabs (Fe heme polypeptide-folic acid) .... Take 8)  Bl Vitamin E 400 Unit Caps (  Vitamin e) .... Once daily 9)  Advair Diskus 250-50 Mcg/dose Misc (Fluticasone-salmeterol) .... Uses as needed 10)  Astelin 137 Mcg/spray Soln (Azelastine hcl) .... At bedtime 11)  Neurontin 300 Mg Caps (Gabapentin) .... 0ne two times a day-tid 12)  Omeprazole 20 Mg Tbec (Omeprazole) .... 0ne once daily 13)  Valtrex 500 Mg Tabs (Valacyclovir hcl) .... Prn 14)  Amitiza 24 Mcg Caps (Lubiprostone) .... Once daily 15)  Vesicare 5 Mg Tabs (Solifenacin succinate) .... Once  daily 16)  K-tabs 10 Meq Tbcr (Potassium chloride) .... Once daily 17)  Mag-ox 400 400 Mg Tabs (Magnesium oxide) .... Two once daily 18)  Darvocet-n 100 100-650 Mg Tabs (Propoxyphene n-apap) .... As needed 19)  Levaquin 750 Mg Tabs (Levofloxacin) .... Once daily     Prescriptions: LEVAQUIN 750 MG  TABS (LEVOFLOXACIN) once daily  #7 x 0   Entered and Authorized by:   Judithann Sheen MD   Signed by:   Judithann Sheen MD on 10/30/2006   Method used:   Handwritten   RxID:   [id number]

## 2010-04-26 NOTE — Progress Notes (Signed)
Summary: rx nabumetone w 5 refills   Phone Note From Pharmacy   Caller: [facility] [address]  Reason for Call: Needs renewal Summary of Call: nabumetone   Initial call taken by: Pura Spice, RN,  March 10, 2009 4:12 PM  Follow-up for Phone Call        ok x 5 per dr Scotty Court  Follow-up by: Pura Spice, RN,  March 10, 2009 4:12 PM    New/Updated Medications: NABUMETONE 750 MG TABS (NABUMETONE) 1 by mouth two times a day Prescriptions: NABUMETONE 750 MG TABS (NABUMETONE) 1 by mouth two times a day  #60 x 5   Entered by:   Pura Spice, RN   Authorized by:   Judithann Sheen MD   Signed by:   Pura Spice, RN on 03/10/2009   Method used:   Telephoned to ...       [facility]  [address] # [id number]* (retail)       [address]       [place], [place]  [address]       Ph: [phone] or [phone]       Fax: [phone]   RxID:   [id number]

## 2010-04-26 NOTE — Progress Notes (Signed)
Summary: lab results.  Phone Note Call from Patient   Caller: Patient Call For: Dr. Scotty Court Summary of Call: Pt wants lab results, please. [phone] Initial call taken by: Lynann Beaver CMA,  Aug 14, 2007 1:01 PM  Follow-up for Phone Call        pt notified . Follow-up by: Pura Spice, RN,  Aug 14, 2007 2:00 PM

## 2010-04-26 NOTE — Progress Notes (Signed)
Summary: Lyrica refill  Phone Note Outgoing Call   Summary of Call: I left a message for pt to return my call. We refilled pts Lyrica and sent to [facility] [address]. I'm now getting a refill request for the Lyrica to be [facility] [address]. I need to know which pharmacy pt uses? And did pt already pick up the Lyrica from [facility]?  Also the RX for Zolpidem. was sent to [facility] also? Initial call taken by: Josph Macho RMA,  December 29, 2009 8:58 AM  Follow-up for Phone Call        Patient states she is getting her RX at [facility] now. Follow-up by: Josph Macho RMA,  December 29, 2009 12:23 PM

## 2010-04-26 NOTE — Assessment & Plan Note (Signed)
Summary: results of urine refill rx/mhf ok per Almira Coaster   Vital Signs:  Patient Profile:   77 Years Old Female Weight:      206 pounds O2 Sat:      90 % Temp:     97.8 degrees F Pulse rate:   96 / minute BP sitting:   136 / 80  (left arm)  Vitals Entered By: Pura Spice, RN (March 04, 2008 2:50 PM)                 Chief Complaint:  go over [place] results waking up at hs to go to [facility]  .  History of Present Illness: Pt cont to have urinary urgencey and nocturia and urgency numbness left leg and hip after epidural Dr. Jeral Fruit and Dr. Deanne Coffer to refill medications nocturia recently    Current Allergies (reviewed today): ! DEMEROL ! HYDROCODONE ! CIPROFLOXACIN HCL (CIPROFLOXACIN HCL) CODEINE PHOSPHATE (CODEINE PHOSPHATE) HYDROCODONE-HOMATROPINE (HYDROCODONE-HOMATROPINE) SULFAMETHOXAZOLE (SULFAMETHOXAZOLE) ZITHROMAX (AZITHROMYCIN)      Physical Exam  General:     Well-developed,well-nourished,in no acute distress; alert,appropriate and cooperative throughout examination Lungs:     Normal respiratory effort, chest expands symmetrically. Lungs are clear to auscultation, no crackles or wheezes. Heart:     Normal rate and regular rhythm. S1 and S2 normal without gallop, murmur, click, rub or other extra sounds. Abdomen:     Bowel sounds positive,abdomen soft and non-tender without masses, organomegaly or hernias noted. Neurologic:     perpheral neuropaty, numness and decreased sensation lower extremities    Impression & Recommendations:  Problem # 1:  NUMBNESS (ICD-782.0) Assessment: New  Problem # 2:  UTI (ICD-599.0) Assessment: Deteriorated  Her updated medication list for this problem includes:    Phenazopyridine Hcl 200 Mg Tabs (Phenazopyridine hcl) .Marland Kitchen... Take 1 tablet by mouth twice a day    Vesicare 5 Mg Tabs (Solifenacin succinate) ..... Once daily    Levaquin 750 Mg Tabs (Levofloxacin) ..... Once daily    Pyridium 200 Mg Tabs (Phenazopyridine hcl)  .Marland Kitchen... 1 three times a day    Flagyl 250 Mg Tabs (Metronidazole) .Marland Kitchen... 1 by mouth two times a day    Levaquin 500 Mg Tabs (Levofloxacin) .Marland Kitchen... 1 qd    Macrobid 100 Mg Caps (Nitrofurantoin monohyd macro) .Marland Kitchen... 1 two times a day for uti   Problem # 3:  URINARY URGENCY (WNU-272.53) Assessment: Deteriorated  Complete Medication List: 1)  Alprazolam 0.5 Mg Tabs (Alprazolam) .Marland Kitchen.. 1 tab three times a day for stress 2)  Amitriptyline Hcl 100 Mg Tabs (Amitriptyline hcl) .... Take 1 tablet by mouth at bedtime 3)  Fexofenadine Hcl 180 Mg Tabs (Fexofenadine hcl) .... Take 1 tablet by mouth once a day 4)  Flonase 50 Mcg/act Susp (Fluticasone propionate) .... Spray 1 spray into both nostrils once a day 5)  Phenazopyridine Hcl 200 Mg Tabs (Phenazopyridine hcl) .... Take 1 tablet by mouth twice a day 6)  Proferrin-forte 12-1 Mg Tabs (Fe heme polypeptide-folic acid) .... Take 7)  Bl Vitamin E 400 Unit Caps (Vitamin e) .... Once daily 8)  Advair Diskus 250-50 Mcg/dose Misc (Fluticasone-salmeterol) .... Uses as needed 9)  Astelin 137 Mcg/spray Soln (Azelastine hcl) .... At bedtime 10)  Neurontin 300 Mg Caps (Gabapentin) .... 0ne two times a day-tid 11)  Omeprazole 20 Mg Tbec (Omeprazole) .Marland Kitchen.. 1 by mouth once daily  needs to call for appt 12)  Valtrex 500 Mg Tabs (Valacyclovir hcl) .... Prn 13)  Amitiza 24 Mcg Caps (Lubiprostone) .Marland KitchenMarland KitchenMarland Kitchen  Once daily 14)  Vesicare 5 Mg Tabs (Solifenacin succinate) .... Once daily 15)  K-tabs 10 Meq Tbcr (Potassium chloride) .... Once daily 16)  Mag-ox 400 400 Mg Tabs (Magnesium oxide) .... Two once daily 17)  Darvocet-n 100 100-650 Mg Tabs (Propoxyphene n-apap) .... As needed 18)  Levaquin 750 Mg Tabs (Levofloxacin) .... Once daily 19)  Ambien 10 Mg Tabs (Zolpidem tartrate) .... On by mouth q hs prn 20)  Amitriptyline Hcl 100 Mg Tabs (Amitriptyline hcl) .... Take 1 at bedtime 21)  Potassium Chloride Crys Cr 20 Meq Tbcr (Potassium chloride crys cr) .Marland Kitchen.. 1 qd 22)  Alprazolam 1 Mg  Tb24 (Alprazolam) .Marland Kitchen.. 1 hs 23)  Pyridium 200 Mg Tabs (Phenazopyridine hcl) .Marland Kitchen.. 1 three times a day 24)  Prilosec 40 Mg Cpdr (Omeprazole) .Marland Kitchen.. 1 once daily 25)  Neurontin 300 Mg Caps (Gabapentin) .Marland Kitchen.. 1 hs 26)  Ambien 10 Mg Tabs (Zolpidem tartrate) .Marland Kitchen.. 1 hs 27)  Maxzide-25 37.5-25 Mg Tabs (Triamterene-hctz) .Marland Kitchen.. 1 once daily as needed for edema 28)  Hydroxyzine Hcl 25 Mg Tabs (Hydroxyzine hcl) .Marland Kitchen.. 1 by mouth three times a day as needed itching 29)  Hemocyte Plus 106-1 Mg Caps (B complex-c-min-fe-fa) .... Take 1 by mouth two times a day 30)  Flagyl 250 Mg Tabs (Metronidazole) .Marland Kitchen.. 1 by mouth two times a day 31)  Nystatin 100000 Unit/ml Susp (Nystatin) 32)  Transderm-scop 1.5 Mg Pt72 (Scopolamine base) 33)  Cyanocobalamin 1000 Mcg/ml Inj Soln (Cyanocobalamin) .... Inject 1 ml im once a week for 4 weeks then inject 1 ml once a month 34)  Levaquin 500 Mg Tabs (Levofloxacin) .Marland Kitchen.. 1 qd 35)  Xyzal 5 Mg Tabs (Levocetirizine dihydrochloride) 36)  Macrobid 100 Mg Caps (Nitrofurantoin monohyd macro) .Marland Kitchen.. 1 two times a day for uti 37)  Ambien 10 Mg Tabs (Zolpidem tartrate) .Marland Kitchen.. 1 hs for insomnia 38)  Flexeril 10 Mg Tabs (Cyclobenzaprine hcl) .Marland Kitchen.. 1 three times a day for muscle spasm   Patient Instructions: 1)  Lyrica  50 mg three times a day for numbness 2)  Macrobid 100 mg two times a day 3)  for infection 4)  Return 1 month for urinalysis and culture   Prescriptions: FLEXERIL 10 MG TABS (CYCLOBENZAPRINE HCL) 1 three times a day for muscle spasm  #90 x 11   Entered and Authorized by:   Judithann Sheen MD   Signed by:   Judithann Sheen MD on 03/04/2008   Method used:   Electronically to        [id number] ([facility]       [address]       [place], [place]  [address]       Ph: [phone] or [phone]       Fax: [phone]   RxID:   [id number] AMBIEN 10 MG TABS (ZOLPIDEM TARTRATE) 1 hs for insomnia  #30 x 5   Entered and  Authorized by:   Judithann Sheen MD   Signed by:   Judithann Sheen MD on 03/04/2008   Method used:   Print then Give to Patient   RxID:   [id number] ALPRAZOLAM 0.5 MG TABS (ALPRAZOLAM) 1 tab three times a day for stress  #90 x 1   Entered and Authorized by:   Judithann Sheen MD   Signed by:   Judithann Sheen MD on 03/04/2008   Method used:   Print  then Give to Patient   RxID:   [id number] MACROBID 100 MG CAPS (NITROFURANTOIN MONOHYD MACRO) 1 two times a day for UTI  #60 x 1   Entered and Authorized by:   Judithann Sheen MD   Signed by:   Judithann Sheen MD on 03/04/2008   Method used:   [facility] to        [id number]       [address]       [place], [place]  [address]       Ph: [phone] or [phone]       Fax: [phone]   RxID:   [id number]  ]

## 2010-04-26 NOTE — Letter (Signed)
Summary: [facility] office note  [facility] office note   Imported By: Kassie Mends 12/12/2006 12:50:32  _____________________________________________________________________  External Attachment:    Type:   Image     Comment:   [facility] office note

## 2010-04-26 NOTE — Progress Notes (Signed)
Summary: lyrica samples  Phone Note Call from Patient Call back at Work Phone [phone]   Caller: Patient Call For: dr Scotty Court Summary of Call: pt would like samples of lyrica 50 mg  Initial call taken by: Heron Sabins,  August 26, 2008 11:54 AM  Follow-up for Phone Call        sorry we are out Follow-up by: Pura Spice, RN,  August 26, 2008 12:04 PM  Additional Follow-up for Phone Call Additional follow up Details #1::        Pt. notified. Additional Follow-up by: Lynann Beaver CMA,  August 26, 2008 12:08 PM

## 2010-04-26 NOTE — Consult Note (Signed)
Summary: [facility]  [facility]   Imported By: Maryln Gottron 11/15/2007 15:24:23  _____________________________________________________________________  External Attachment:    Type:   Image     Comment:   External Document

## 2010-04-26 NOTE — Assessment & Plan Note (Signed)
Summary: F/U FROM IRON INFUSION AND LABS      Alexis Olson   History of Present Illness Visit Type: Follow-up Visit Primary GI MD: [name] MD Primary Provider: London Sheer Chief Complaint: Iron Infusion & labs History of Present Illness:   This is a 77 year old white female who is status post gastric bypass surgery 34 years ago with chronic iron deficiency anemia and chronic low-grade GI blood loss which has been unaccounted for. She has required periodic iron infusions as well as oral iron supplement. She has occasional diarrhea;  prior to the last infusion of iron on 07/15/08 her hemoglobin was 10.8 with an iron saturation of 16.9%. Today her hemoglobin is up to 11.4 with an iron saturation of 17%. 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 An upper endoscopy in January 2006 showed a normal small bowel biopsy with fundic gland polyps. Her sister just passed away 2 weeks ago.   GI Review of Systems    Reports nausea.      Denies abdominal pain, acid reflux, belching, bloating, chest pain, dysphagia with liquids, dysphagia with solids, heartburn, loss of appetite, vomiting, vomiting blood, weight loss, and  weight gain.      Reports diarrhea.     Denies anal fissure, black tarry stools, change in bowel habit, constipation, diverticulosis, fecal incontinence, heme positive stool, hemorrhoids, irritable bowel syndrome, jaundice, light color stool, liver problems, rectal bleeding, and  rectal pain.    Current Medications (verified): 1)  Fexofenadine Hcl 180 Mg Tabs (Fexofenadine Hcl) .... Take 1 Tablet By Mouth Once A Day 2)  Flonase 50 Mcg/act Susp (Fluticasone Propionate) .... Spray 1 Spray Into Both Nostrils Once A Day 3)  Bl Vitamin E 400 Unit  Caps (Vitamin E) .... Once Daily 4)  Advair Diskus 250-50 Mcg/dose  Misc (Fluticasone-Salmeterol) .... Uses As Needed 5)  Astelin 137 Mcg/spray  Soln (Azelastine Hcl) .... At Bedtime 6)  Omeprazole 20 Mg  Tbec  (Omeprazole) .Marland Kitchen.. 1 By Mouth Once Daily 7)  Valtrex 500 Mg  Tabs (Valacyclovir Hcl) .... Prn 8)  Amitiza 24 Mcg  Caps (Lubiprostone) .... Once Daily 9)  K-Tabs 10 Meq  Tbcr (Potassium Chloride) .... Once Daily 10)  Mag-Ox 400 400 Mg  Tabs (Magnesium Oxide) .... Two Once Daily 11)  Ambien 10 Mg  Tabs (Zolpidem Tartrate) .... On By Mouth Q Hs Prn 12)  Amitriptyline Hcl 100 Mg  Tabs (Amitriptyline Hcl) .... Take 1 At Bedtime 13)  Potassium Chloride Crys Cr 20 Meq  Tbcr (Potassium Chloride Crys Cr) .Marland Kitchen.. 1 Qd 14)  Pyridium 200 Mg  Tabs (Phenazopyridine Hcl) .Marland Kitchen.. 1 Three Times A Day 15)  Neurontin 300 Mg  Caps (Gabapentin) .Marland Kitchen.. 1 Hs 16)  Maxzide-25 37.5-25 Mg  Tabs (Triamterene-Hctz) .Marland Kitchen.. 1 Once Daily As Needed For Edema 17)  Hydroxyzine Hcl 25 Mg  Tabs (Hydroxyzine Hcl) .Marland Kitchen.. 1 By Mouth Three Times A Day As Needed Itching 18)  Hemocyte Plus 106-1 Mg  Caps (B Complex-C-Min-Fe-Fa) .... Take 1 By Mouth Two Times A Day 19)  Nystatin 100000 Unit/ml  Susp (Nystatin) 20)  Transderm-Scop 1.5 Mg  Pt72 (Scopolamine Base) 21)  Cyanocobalamin 1000 Mcg/ml Inj Soln (Cyanocobalamin) .... Inject 1 Ml Im Once A Week For 4 Weeks Then Inject 1 Ml Once A Month 22)  Xyzal 5 Mg Tabs (Levocetirizine Dihydrochloride) 23)  Flexeril 10 Mg Tabs (Cyclobenzaprine Hcl) .Marland Kitchen.. 1 Three Times A Day For Muscle Spasm 24)  Alprazolam 1 Mg Tabs (Alprazolam) .Marland Kitchen.. 1 Three  Times A Day As Needed Stress 25)  Optivar 0.05 % Soln (Azelastine Hcl) .Marland Kitchen.. 1 Drop  Each  Eye Bid 26)  Lyrica 50 Mg Caps (Pregabalin) .Marland Kitchen.. 1 Two Times A Day 27)  Levaquin 500 Mg Tabs (Levofloxacin) .Marland Kitchen.. 1 Qd  Allergies (verified): 1)  ! Demerol 2)  ! Hydrocodone 3)  ! Ciprofloxacin Hcl (Ciprofloxacin Hcl) 4)  Codeine Phosphate (Codeine Phosphate) 5)  Hydrocodone-Homatropine (Hydrocodone-Homatropine) 6)  Sulfamethoxazole (Sulfamethoxazole) 7)  Zithromax (Azithromycin)  Past History:  Past Medical History:    Reviewed history from 07/09/2008 and no changes  required:    Asthma    Obesity    DIVERTICULOSIS, COLON (ICD-562.10)    Hx of ESOPHAGEAL STRICTURE (ICD-530.3)    VITAMIN B12 DEFICIENCY (ICD-266.2)    COLONIC POLYPS, ADENOMATOUS, HX OF (ICD-V12.72)    ANEMIA (ICD-285.9)    ACUTE PHARYNGITIS (ICD-462)    LARYNGITIS, ACUTE (ICD-464.00)    NUMBNESS (ICD-782.0)    UTI (ICD-599.0)    ENTERITIS (ICD-558.9)    URINARY URGENCY (ICD-788.63)    BACK PAIN (ICD-724.5)    ANXIETY (ICD-300.00)    BARIATRIC SURGERY STATUS (ICD-V45.86)    FATIGUE (ICD-780.79)    INSOMNIA (ICD-780.52)    ANEMIA, CHRONIC (ICD-281.9)    CYSTITIS, ACUTE (ICD-595.0)    BACK PAIN (ICD-724.5)    GASTROINTESTINAL HEMORRHAGE, HX OF (ICD-V12.79)    ASTHMA (ICD-493.90)    ALLERGIC RHINITIS (ICD-477.9)  Past Surgical History:    Reviewed history from 07/09/2008 and no changes required:    Cholecystectomy    Ileojejunal Bypass     Tonsillectomy    Back surgery-ruptured disk  Family History:    No FH of Colon Cancer:    Family History of Liver Disease/Cirrhosis:Sister    Family History of Kidney Disease: Sister    Cervical Cancer: Mother  Social History:    Illicit Drug Use - no    Patient has never smoked.     Alcohol Use - no    Daily Caffeine Use  diet coke  and mt dew some coffee    Occupation: [employer]  Review of Systems       The patient complains of allergy/sinus, anemia, anxiety-new, back pain, cough, depression-new, fatigue, shortness of breath, sleeping problems, swelling of feet/legs, and thirst - excessive.         Pertinent positive and negative review of systems were noted in the above HPI. All other ROS was otherwise negative.   Vital Signs:  Patient profile:   77 year old female Height:      65 inches Weight:      210.25 pounds BMI:     35.11 Pulse rate:   100 / minute Pulse rhythm:   regular BP sitting:   102 / 64  (left arm)  Vitals Entered By: June McMurray CMA (Aug 04, 2008 1:54 PM)  Physical Exam  General:  alert,  oriented, very cooperative. Overweight. Head:  Normocephalic and atraumatic. Eyes:  nonicteric. Neck:  Supple; no masses or thyromegaly. Lungs:  Clear throughout to auscultation. Heart:  Regular rate and rhythm; no murmurs, rubs or bruits. Abdomen:  Soft, nontender and nondistended. No masses, hepatosplenomegaly or hernias noted. Normal bowel sounds. Rectal:  soft grayish hemoccult-positive stool. Neurologic:  Alert and oriented x4; grossly normal neurologically. Psych:  Alert and cooperative. Normal mood and affect.   Impression & Recommendations:  Problem # 1:  ANEMIA (ICD-285.9) chronic iron deficiency anemia due to low-grade GI blood loss of unknown etiology. Patient had a prior gastric  bypass surgery which might have resulted in chronic GI blood loss. She needs to remain on oral iron and on periodic iron infusions. We will repeat the upper endoscopy/enteroscopy and colonoscopy. We will obtain IBD markers today to rule out Crohn's disease . Obtain CEA level and tissue transglutaminase.  Problem # 2:  COLONIC POLYPS, ADENOMATOUS, HX OF (ICD-V12.72) Patient's last colonoscopy was  in January 2006. The patient will be scheduled  a repeat colonoscopy  . Orders: IBD Serology ([facility] #[id number]) T-Tissue Transglutamase Ab IgA ([id number]) Colonoscopy (Colon) Enteroscopy  (Enteroscopy) TLB-CEA (Carcinoembryonic Antigen) ([id number])  Problem # 3:  GASTROINTESTINAL HEMORRHAGE, HX OF (ICD-V12.79) Patient has hemoccult-positive stool today and on every exam, however, her GI workup has been negative. I feel her GI blood loss is associated in some way with her gastric bypass.  Orders: Colonoscopy (Colon) Enteroscopy  (Enteroscopy)  Patient Instructions: 1)  upper endoscopy/enteroscopy 2)  Colonoscopy with MiraLax prep 3)  Monitor H&H and iron levels 4)  CEA, IBD markers and tissue transglutaminase today. 5)  Copy sent to :Dr Dellie Burns Prescriptions: DULCOLAX 5 MG  TBEC  (BISACODYL) Day before procedure take 2 at 3pm and 2 at 8pm.  #4 x 0   Entered by:   Hortense Ramal CMA   Authorized by:   Hart Carwin   Signed by:   Hortense Ramal CMA on 08/04/2008   Method used:   Electronically to        [id number] (retail)       [address]       [place], [place]  [address]       Ph: [phone] or [phone]       Fax: [phone]   RxID:   [id number] REGLAN 10 MG  TABS (METOCLOPRAMIDE HCL) As per prep instructions.  #2 x 0   Entered by:   Hortense Ramal CMA   Authorized by:   Hart Carwin   Signed by:   Hortense Ramal CMA on 08/04/2008   Method used:   Electronically to        [id number]* (retail)       [address]       [place], [place]  [address]       Ph: [phone] or [phone]       Fax: [phone]   RxID:   [id number] MIRALAX   POWD (POLYETHYLENE GLYCOL 3350) As per prep  instructions.  #255 grams x 0   Entered by:   Hortense Ramal CMA   Authorized by:   Hart Carwin   Signed by:   Hortense Ramal CMA on 08/04/2008   Method used:   Electronically to        [id number]* (retail)       [address]       [place], [place]  [address]       Ph: [phone] or [phone]       Fax: [phone]   RxID:   [id number]

## 2010-04-26 NOTE — Medication Information (Signed)
Summary: Refill Request for Potassium CL  Refill Request for Potassium CL   Imported By: Maryln Gottron 09/16/2009 14:51:22  _____________________________________________________________________  External Attachment:    Type:   Image     Comment:   External Document

## 2010-04-26 NOTE — Progress Notes (Signed)
Summary: Pt req samples for Lyrica   [facility] 03/23/2009  Phone Note Call from Patient Call back at [facility] Phone [phone] Call back at Work Phone [phone]   Caller: Patient Summary of Call: Pt req samples of Lyrica 50mg . Please call when ready for pick up. Pt is coming in for a physical on 03/30/09.   Initial call taken by: Lucy Antigua,  March 22, 2009 12:59 PM  Follow-up for Phone Call        sorry  no samples avalable  Follow-up by: Pura Spice, RN,  March 23, 2009 8:09 AM  Additional Follow-up for Phone Call Additional follow up Details #1::        LMTCB  Additional Follow-up by: Lynann Beaver CMA,  March 23, 2009 8:33 AM     Appended Document: Pt req samples for Lyrica   [facility] 03/23/2009 Pt informed no samples.

## 2010-04-26 NOTE — Assessment & Plan Note (Signed)
Summary: CHEST CONGESTION/COUGH/CJR   Vital Signs:  Patient profile:   77 year old female Height:      65 inches (165.10 cm) Weight:      198 pounds (90.00 kg) O2 Sat:      97 % on Room air Temp:     99.0 degrees F (37.22 degrees C) oral Pulse rate:   111 / minute BP sitting:   120 / 84  (left arm) Cuff size:   large  Vitals Entered By: Josph Macho RMA (February 03, 2010 11:10 AM)  O2 Flow:  Room air CC: Chest congestion/ coughw/phlegm (yellowish sometimes) X5 days/ Possible UTI- no burning but has frequent urination/ CF, Lipid Management Is Patient Diabetic? No   History of Present Illness: 77 yo Caucasian female in today for evaluation chest congestion. She reports a long history of intermittent bronchitis and is concerned that her symptoms are worsening. She has some tightness in congestion in her chest a cough productive of yellow sputum. She's had some low-grade chills, myalgias fatigue. She notes some rhinorrhea but it is largely clear still she does note some pressure and headache as well. She notes also some anorexia and some issues with urination as well. She is noting some mild dysuria and urinary frequency as well as urgency. She denies abdominal pain she denies constipation but doesn't have a long-standing history of diarrhea but this is unchanged at present.  Lipid Management History:      Positive NCEP/ATP III risk factors include female age 52 years old or older and hypertension.  Negative NCEP/ATP III risk factors include HDL cholesterol greater than 60 and non-tobacco-user status.     Current Medications (verified): 1)  Fexofenadine Hcl 180 Mg Tabs (Fexofenadine Hcl) .... Take 1 Tablet By Mouth Once A Day 2)  Flonase 50 Mcg/act Susp (Fluticasone Propionate) .... Spray 1 Spray Into Both Nostrils Once A Day 3)  Bl Vitamin E 400 Unit  Caps (Vitamin E) .... Once Daily 4)  Advair Diskus 250-50 Mcg/dose  Misc (Fluticasone-Salmeterol) .... Uses As Needed 5)  Omeprazole  20 Mg  Tbec (Omeprazole) .Marland Kitchen.. 1 By Mouth Once Daily. Must Have Office Visit For Further Refills! 6)  Valtrex 500 Mg  Tabs (Valacyclovir Hcl) .... Prn 7)  Amitiza 24 Mcg  Caps (Lubiprostone) .... Once Daily 8)  Mag-Ox 400 400 Mg  Tabs (Magnesium Oxide) .... Two Once Daily 9)  Ambien 10 Mg  Tabs (Zolpidem Tartrate) .... On By Mouth Q Hs As Needed 10)  Amitriptyline Hcl 100 Mg  Tabs (Amitriptyline Hcl) .... Take 1 At Bedtime 11)  Potassium Chloride Crys Cr 20 Meq  Tbcr (Potassium Chloride Crys Cr) .... 1/2 To 1 Tab Once Daily 12)  Hydroxyzine Hcl 25 Mg  Tabs (Hydroxyzine Hcl) .Marland Kitchen.. 1 By Mouth Three Times A Day As Needed Itching 13)  Hemocyte Plus 106-1 Mg  Caps (B Complex-C-Min-Fe-Fa) .... Take 1 By Mouth Two Times A Day 14)  Cyanocobalamin 1000 Mcg/ml Inj Soln (Cyanocobalamin) .... Inject 1 Ml Im Once A Week For 4 Weeks Then Inject 1 Ml Once A Month 15)  Flexeril 10 Mg Tabs (Cyclobenzaprine Hcl) .Marland Kitchen.. 1 Three Times A Day For Muscle Spasm 16)  Optivar 0.05 % Soln (Azelastine Hcl) .Marland Kitchen.. 1 Drop  Each  Eye Bid 17)  Lyrica 50 Mg Caps (Pregabalin) .... Take 1caplets By Mouth Two Times A Day 18)  Bentyl 20 Mg Tabs (Dicyclomine Hcl) .... Take One By Mouth 3 Times Daily As Needed 19)  Mycelex Otc 1 %  Crea (Clotrimazole) .... Two Times A Day For 7 Days 20)  Alprazolam 1 Mg Tabs (Alprazolam) .Marland Kitchen.. 1 By Mouth Three Times A Day As Needed Stress Anxiety  Allergies (verified): 1)  ! Demerol 2)  ! Hydrocodone 3)  ! Ciprofloxacin Hcl (Ciprofloxacin Hcl) 4)  Codeine Phosphate (Codeine Phosphate) 5)  Hydrocodone-Homatropine (Hydrocodone-Homatropine) 6)  Zithromax (Azithromycin)  Past History:  Past medical history reviewed for relevance to current acute and chronic problems. Social history (including risk factors) reviewed for relevance to current acute and chronic problems.  Past Medical History: Reviewed history from 07/02/2009 and no changes required. Asthma Obesity DIVERTICULOSIS, COLON (ICD-562.10) Hx  of ESOPHAGEAL STRICTURE (ICD-530.3) VITAMIN B12 DEFICIENCY (ICD-266.2) COLONIC POLYPS, ADENOMATOUS, HX OF (ICD-V12.72) ANEMIA (ICD-285.9) ACUTE PHARYNGITIS (ICD-462) LARYNGITIS, ACUTE (ICD-464.00) NUMBNESS (ICD-782.0) UTI (ICD-599.0) ENTERITIS (ICD-558.9) URINARY URGENCY (ICD-788.63) BACK PAIN (ICD-724.5) ANXIETY (ICD-300.00) BARIATRIC SURGERY STATUS (ICD-V45.86) FATIGUE (ICD-780.79) INSOMNIA (ICD-780.52) ANEMIA, CHRONIC (ICD-281.9) CYSTITIS, ACUTE (ICD-595.0) BACK PAIN (ICD-724.5) GASTROINTESTINAL HEMORRHAGE, HX OF (ICD-V12.79) ASTHMA (ICD-493.90) ALLERGIC RHINITIS (ICD-477.9) 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   Social History: Reviewed history from 08/04/2008 and no changes required. Illicit Drug Use - no Patient has never smoked.  Alcohol Use - no Daily Caffeine Use  diet coke  and mt dew some coffee Occupation: [employer]  Review of Systems      See HPI  Physical Exam  General:  Well-developed,well-nourished,in no acute distress; alert,appropriate and cooperative throughout examination Head:  Normocephalic and atraumatic without obvious abnormalities. No apparent alopecia or balding. Ears:  External ear exam shows no significant lesions or deformities.  Otoscopic examination reveals clear canals, tympanic membranes are intact bilaterally without bulging, retraction, inflammation or discharge. Hearing is grossly normal bilaterally. Nose:  mucosal erythema and mucosal edema.   Mouth:  Oral mucosa and oropharynx without lesions or exudates.  Teeth in good repair. Neck:  No deformities, masses, or tenderness noted. Lungs:  Normal respiratory effort, chest expands symmetrically. Lungs are clear to auscultation, no crackles or wheezes. Heart:  Normal rate and regular rhythm. S1 and S2 normal without gallop, murmur, click, rub or other extra sounds. Abdomen:  Bowel sounds positive,abdomen soft and non-tender without  masses, organomegaly or hernias noted. Extremities:  No clubbing, cyanosis, edema, or deformity noted  Psych:  Cognition and judgment appear intact. Alert and cooperative with normal attention span and concentration. No apparent delusions, illusions, hallucinations   Impression & Recommendations:  Problem # 1:  ACUTE BRONCHITIS (ICD-466.0)  Her updated medication list for this problem includes:    Advair Diskus 250-50 Mcg/dose Misc (Fluticasone-salmeterol) ..... Uses as needed    Levaquin 500 Mg Tabs (Levofloxacin) .Marland Kitchen... 1 tab by mouth daily x 10 days    Mucinex 600 Mg Xr12h-tab (Guaifenesin) .Marland Kitchen... 1 tab by mouth two times a day x 10 days  Problem # 2:  UTI (ICD-599.0)  Her updated medication list for this problem includes:    Levaquin 500 Mg Tabs (Levofloxacin) .Marland Kitchen... 1 tab by mouth daily x 10 days  Orders: UA Dipstick w/o Micro (manual) (14782), is treated with Levaquin  Problem # 3:  HYPERTENSION (ICD-401.9) Well controlled, no change in meds  Complete Medication List: 1)  Fexofenadine Hcl 180 Mg Tabs (Fexofenadine hcl) .... Take 1 tablet by mouth once a day 2)  Flonase 50 Mcg/act Susp (Fluticasone propionate) .... Spray 1 spray into both nostrils once a day 3)  Bl Vitamin E 400 Unit Caps (Vitamin e) .... Once daily  4)  Advair Diskus 250-50 Mcg/dose Misc (Fluticasone-salmeterol) .... Uses as needed 5)  Omeprazole 20 Mg Tbec (Omeprazole) .Marland Kitchen.. 1 by mouth once daily. must have office visit for further refills! 6)  Valtrex 500 Mg Tabs (Valacyclovir hcl) .... Prn 7)  Amitiza 24 Mcg Caps (Lubiprostone) .... Once daily 8)  Mag-ox 400 400 Mg Tabs (Magnesium oxide) .... Two once daily 9)  Ambien 10 Mg Tabs (Zolpidem tartrate) .... On by mouth q hs as needed 10)  Amitriptyline Hcl 100 Mg Tabs (Amitriptyline hcl) .... Take 1 at bedtime 11)  Potassium Chloride Crys Cr 20 Meq Tbcr (Potassium chloride crys cr) .... 1/2 to 1 tab once daily 12)  Hydroxyzine Hcl 25 Mg Tabs (Hydroxyzine hcl)  .Marland Kitchen.. 1 by mouth three times a day as needed itching 13)  Hemocyte Plus 106-1 Mg Caps (B complex-c-min-fe-fa) .... Take 1 by mouth two times a day 14)  Cyanocobalamin 1000 Mcg/ml Inj Soln (Cyanocobalamin) .... Inject 1 ml im once a week for 4 weeks then inject 1 ml once a month 15)  Flexeril 10 Mg Tabs (Cyclobenzaprine hcl) .Marland Kitchen.. 1 three times a day for muscle spasm 16)  Optivar 0.05 % Soln (Azelastine hcl) .Marland Kitchen.. 1 drop  each  eye bid 17)  Lyrica 50 Mg Caps (Pregabalin) .... Take 1caplets by mouth two times a day 18)  Bentyl 20 Mg Tabs (Dicyclomine hcl) .... Take one by mouth 3 times daily as needed 19)  Mycelex Otc 1 % Crea (Clotrimazole) .... Two times a day for 7 days 20)  Alprazolam 1 Mg Tabs (Alprazolam) .Marland Kitchen.. 1 by mouth three times a day as needed stress anxiety 21)  Align 4 Mg Caps (Probiotic product) 22)  Levaquin 500 Mg Tabs (Levofloxacin) .Marland Kitchen.. 1 tab by mouth daily x 10 days 23)  Mucinex 600 Mg Xr12h-tab (Guaifenesin) .Marland Kitchen.. 1 tab by mouth two times a day x 10 days  Lipid Assessment/Plan:      Based on NCEP/ATP III, the patient's risk factor category is "0-1 risk factors".  The patient's lipid goals are as follows: Total cholesterol goal is 200; LDL cholesterol goal is 160; HDL cholesterol goal is 40; Triglyceride goal is 150.    Patient Instructions: 1)  Please schedule a follow-up appointment as needed if symptoms worsen or any concerns 2)  Push clear fluids and cont yogurt daily Prescriptions: LEVAQUIN 500 MG TABS (LEVOFLOXACIN) 1 tab by mouth daily x 10 days  #10 x 0   Entered and Authorized by:   Danise Edge MD   Signed by:   Danise Edge MD on 02/03/2010   Method used:   Electronically to        [facility]  [address] [id number] ([facility]       [address]       [place], [place]  [address]       Ph: [phone] or [phone]       Fax: [phone]   RxID:   [id number]    Orders Added: 1)  UA Dipstick w/o Micro (manual) [81002] 2)  Est. Patient Level IV  [93810]    Laboratory Results   Urine Tests    Routine Urinalysis   Color: yellow Appearance: Clear Glucose: negative   (Normal Range: Negative) Bilirubin: negative   (Normal Range: Negative) Ketone: negative   (Normal Range: Negative) Spec. Gravity: <1.005   (Normal Range: 1.003-1.035) Blood: trace-lysed   (Normal Range: Negative) Protein: negative   (Normal Range: Negative) Urobilinogen: 0.2   (  Normal Range: 0-1) Nitrite: negative   (Normal Range: Negative) Leukocyte Esterace: 2+   (Normal Range: Negative)    Comments: Rita Ohara  February 03, 2010 2:06 PM

## 2010-04-26 NOTE — Progress Notes (Signed)
Summary: NEUROSURGERY REFERRAL?  Phone Note Call from Patient   Caller: Spouse Alexis Olson) [phone] Reason for Call: Talk to Doctor Summary of Call: Pts husband Alexis Olson) called to adv that is wife would like to see Dr Channing Mutters, Neurosurgery..... in ref to cyst on back??   Pts husband adv that they spoke with Dr Scotty Court this am and he adv for them to c/b and let him know whick neurosurgeon the pt wants an appt with.  Pt / Pts husband can be reached at [phone] with any questions or concerns.  Initial call taken by: Alexis Olson,  April 29, 2009 10:03 AM  Follow-up for Phone Call        Pt called [place] back  and has not heard from Dr Herbert Seta. Dr Scotty Court told me to contact Dr Herbert Seta' office [phone].  Iwas told Dr Herbert Seta is seeing pt and they will contact pt with appt.Patient notified as instructed by telephone. Lewanda Rife LPN  April 29, 2009 1:20 PM

## 2010-04-26 NOTE — Procedures (Signed)
Summary: Gastroenterology-Capsule Endo  Gastroenterology-Capsule Endo   Imported By: Hortense Ramal CMA 07/10/2008 16:48:50  _____________________________________________________________________  External Attachment:    Type:   Image     Comment:   External Document

## 2010-04-26 NOTE — Progress Notes (Signed)
Summary: Pt req samples of Lyrica 50mg . Call on pts cellphone only  Phone Note Call from Patient Call back at [phone] cell   Caller: Patient Summary of Call: Pt called req samples of Lyrica 50mg . Please call on cellphone only [phone] cell Initial call taken by: Lucy Antigua,  June 10, 2009 1:54 PM  Follow-up for Phone Call        no samples. does she need rx? thanks Follow-up by: Pura Spice, RN,  June 10, 2009 2:44 PM  Additional Follow-up for Phone Call Additional follow up Details #1::        Pt would like a rx. Please call in to [facility] on [address] with refills or #90.  Additional Follow-up by: Lucy Antigua,  June 14, 2009 12:01 PM    Prescriptions: LYRICA 50 MG CAPS (PREGABALIN) 1 two times a day  #60 x 3   Entered by:   Pura Spice, RN   Authorized by:   Judithann Sheen MD   Signed by:   Pura Spice, RN on 06/16/2009   Method used:   Telephoned to ...       [facility]  [address] # [id number]* (retail)       [address]       [place], [place]  [address]       Ph: [phone] or [phone]       Fax: [phone]   RxID:   [id number]

## 2010-04-26 NOTE — Miscellaneous (Signed)
Summary: [facility]: Home Health Cert.& Cert.  [facility]: Home Health Cert.& Cert.   Imported By: Florinda Marker 08/18/2009 16:34:57  _____________________________________________________________________  External Attachment:    Type:   Image     Comment:   External Document

## 2010-04-26 NOTE — Procedures (Signed)
Summary: EGD   EGD  Procedure date:  03/29/2004  Findings:      Location: [facility]    Patient Name: Alexis Olson, Alexis Olson MRN:  Procedure Procedures: Panendoscopy (EGD) CPT: 43235.    with biopsy(s)/brushing(s). CPT: D1846139.    with esophageal dilation. CPT: G9296129.  Personnel: Endoscopist: Dora L. Juanda Chance, MD.  Referred By: Dianna Limbo, MD.  Exam Location: Exam performed in Outpatient Clinic. Outpatient  Patient Consent: Procedure, Alternatives, Risks and Benefits discussed, consent obtained, from patient. Consent was obtained by the RN.  Indications  Evaluation of: Anemia,  with low ferritin. Positive fecal occult blood test per home screening.  Symptoms: Dysphagia.  History  Current Medications: Patient is not currently taking Coumadin.  Pre-Exam Physical: Performed Mar 29, 2004  Entire physical exam was normal.  Exam Exam Info: Maximum depth of insertion Duodenum, intended Duodenum. Vocal cords visualized. Gastric retroflexion performed. Images taken. ASA Classification: I. Tolerance: good.  Sedation Meds: Patient assessed and found to be appropriate for moderate (conscious) sedation. Fentanyl 50 mcg. given IV. Versed 7 mg. given IV. Cetacaine Spray given aerosolized.  Monitoring: BP and pulse monitoring done. Oximetry used. Supplemental O2 given  Findings Dilation: Distal Esophagus. Maloney dilator used, Diameter: 48 mm, Minimal Resistance, No Heme present on extraction. 1  total dilators used. Patient tolerance fair. Outcome: successful.  - POLYP: in Fundus. Maximum size: 3 mm. Diminutive, sessile polyp. Procedure: biopsy without cautery, removed, Polyp retrieved, sent to pathology. ICD9: Colon Polyps: 211.3. Comments: multiple polyps.  DIAGNOSTIC TEST: Biopsy taken. from Duodenal 2nd Portion. Reason: r/o villous atrophy.   Assessment Abnormal examination, see findings above.  Diagnoses: 211.3: Colon Polyps.   Comments: nothing to   account for heme positive stool Events  Unplanned Intervention: No unplanned interventions were required.  Unplanned Events: There were no complications. Plans Medication(s): Await pathology.  Comments: colonoscopy Disposition: After procedure patient sent to recovery. After recovery patient sent home.   This report was created from the original endoscopy report, which was reviewed and signed by the above listed endoscopist.

## 2010-04-26 NOTE — Progress Notes (Signed)
Summary: wants something for back pain  Phone Note Call from Patient Call back at Home Phone [phone]   Caller: Patient-live call Reason for Call: Acute Illness Summary of Call: On relefen and flexeril. Having back pain. need something stronger without codeine in it. Call [facility] on [address]. Initial call taken by: Warnell Forester,  April 14, 2009 2:33 PM  Follow-up for Phone Call         per dr Scotty Court to call in tramadol  which i [facility] [address]  Follow-up by: Pura Spice, RN,  April 14, 2009 3:35 PM    New/Updated Medications: TRAMADOL HCL 50 MG TABS (TRAMADOL HCL) 1 or 2 evry 4 -6 hrs as needed for back pain Prescriptions: TRAMADOL HCL 50 MG TABS (TRAMADOL HCL) 1 or 2 evry 4 -6 hrs as needed for back pain  #50 x 5   Entered by:   Pura Spice, RN   Authorized by:   Judithann Sheen MD   Signed by:   Pura Spice, RN on 04/14/2009   Method used:   Electronically to        [facility] # [id number]* (retail)       [address]       [place], [place]  [address]       Ph: [phone] or [phone]       Fax: [phone]   RxID:   [id number]  Pt. notified.

## 2010-04-26 NOTE — Miscellaneous (Signed)
Summary: HIPAA Restrictions  HIPAA Restrictions   Imported By: Florinda Marker 05/06/2009 14:14:21  _____________________________________________________________________  External Attachment:    Type:   Image     Comment:   External Document

## 2010-04-26 NOTE — Progress Notes (Signed)
Summary: LYRICA  Phone Note Call from Patient   Caller: Patient Call For: Alexis Sheen MD Summary of Call: [facility]) Needs Lyrica 50 mg. samples until Dr.  Scotty Court can refill her prescription. Samples left up front....Marland KitchenMarland Kitchen#21 Initial call taken by: Lynann Beaver CMA,  June 04, 2008 2:43 PM  Follow-up for Phone Call        pt notified  [facility]  Follow-up by: Pura Spice, RN,  June 09, 2008 8:17 AM      Prescriptions: LYRICA 50 MG CAPS (PREGABALIN) 1 two times a day  #60 x 5   Entered by:   Pura Spice, RN   Authorized by:   Alexis Sheen MD   Signed by:   Pura Spice, RN on 06/09/2008   Method used:   Telephoned to ...       [facility]  [address] [id number]* (retail)       [address]       [place], [place]  [address]       Ph: [phone] or [phone]       Fax: [phone]   RxID:   [id number]

## 2010-04-26 NOTE — Progress Notes (Signed)
Summary: the uti med is working well   Phone Note Call from Patient   Caller: pt live Call For: [employer] of Call: the levoquin is working well  Initial call taken by: Roselle Locus,  November 28, 2007 2:07 PM  Follow-up for Phone Call        dr stafford informed  Follow-up by: Pura Spice, RN,  November 28, 2007 2:14 PM

## 2010-04-26 NOTE — Assessment & Plan Note (Signed)
Summary: problem with urinating/jls   Vital Signs:  Patient Profile:   77 Years Old Female Weight:      201 pounds Temp:     97.8 degrees F Pulse rate:   105 / minute BP sitting:   140 / 90  (left arm) Cuff size:   large  Vitals Entered By: Pura Spice, RN (September 26, 2007 1:58 PM)                 Chief Complaint:  leaving on a trip and states she can't urinate wants DDAV drug .Marland Kitchen  History of Present Illness: Pt getting ready to go to [place] for two weeks and states when she is on a bus or plane has urgency to urinate but  annot and would like medication to help overactive urgency. Has been told by a friend who uses DDAVP with good results and would like to try this No othjer problems at this time BP under good controll, no bowel problems at this time and feels energetic    Current Allergies (reviewed today): No known allergies      Review of Systems      See HPI  CV      Denies bluish discoloration of lips or nails, chest pain or discomfort, difficulty breathing at night, difficulty breathing while lying down, fainting, fatigue, leg cramps with exertion, lightheadness, near fainting, palpitations, shortness of breath with exertion, swelling of feet, swelling of hands, and weight gain.  GI      Denies abdominal pain, bloody stools, change in bowel habits, constipation, dark tarry stools, diarrhea, excessive appetite, gas, hemorrhoids, indigestion, loss of appetite, nausea, vomiting, vomiting blood, and yellowish skin color.  GU      Denies abnormal vaginal bleeding, decreased libido, discharge, dysuria, genital sores, hematuria, incontinence, nocturia, urinary frequency, and urinary hesitancy.   Physical Exam  General:     Well-developed,well-nourished,in no acute distress; alert,appropriate and cooperative throughout examinationoverweight-appearing.   Lungs:     Normal respiratory effort, chest expands symmetrically. Lungs are clear to auscultation, no crackles or  wheezes. Heart:     Normal rate and regular rhythm. S1 and S2 normal without gallop, murmur, click, rub or other extra sounds. Abdomen:     Bowel sounds positive,abdomen soft and non-tender without masses, organomegaly or hernias noted.    Impression & Recommendations:  Problem # 1:  URINARY URGENCY (UEA-540.98) Assessment: New DDAVP .2 mg q 12   urinalysis negative  Complete Medication List: 1)  Amitriptyline Hcl 100 Mg Tabs (Amitriptyline hcl) .... Take 1 at bedtime 2)  Potassium Chloride Crys Cr 20 Meq Tbcr (Potassium chloride crys cr) .Marland Kitchen.. 1 qd 3)  Alprazolam 1 Mg Tb24 (Alprazolam) .Marland Kitchen.. 1 hs 4)  Pyridium 200 Mg Tabs (Phenazopyridine hcl) .Marland Kitchen.. 1 three times a day 5)  Prilosec 40 Mg Cpdr (Omeprazole) .Marland Kitchen.. 1 once daily 6)  Neurontin 300 Mg Caps (Gabapentin) .Marland Kitchen.. 1 hs 7)  Ambien 10 Mg Tabs (Zolpidem tartrate) .Marland Kitchen.. 1 hs 8)  Maxzide-25 37.5-25 Mg Tabs (Triamterene-hctz) .Marland Kitchen.. 1 once daily as needed for edema 9)  Hydroxyzine Hcl 25 Mg Tabs (Hydroxyzine hcl) .Marland Kitchen.. 1 by mouth three times a day as needed itching 10)  Hemocyte Plus 106-1 Mg Caps (B complex-c-min-fe-fa) .... Take 1 by mouth once daily 11)  Ddavp 0.2 Mg Tabs (Desmopressin acetate) .Marland Kitchen.. 1 q12h  Other Orders: UA Dipstick w/o Micro (automated)  (81003)   Patient Instructions: 1)  TO TRY ddavp FOR PT WHEN ON TRIP TO  PREVENT URGENCY AND PROBLEM WITH DIFFICULTY TO URINATE ON BUS OR PLANE 2)  urinalysis negative   Prescriptions: DDAVP 0.2 MG  TABS (DESMOPRESSIN ACETATE) 1 q12h  #30 x 5   Entered and Authorized by:   Judithann Sheen MD   Signed by:   Judithann Sheen MD on 09/30/2007   Method used:   Electronically sent to ...       [facility]  [address] [id number]*       [address]       [place], [place]  [address]       Ph: [phone] or [phone]       Fax: [phone]   RxID:   [id number]  ] Laboratory Results   Urine Tests    Routine Urinalysis   Color:  yellow Appearance: Clear Glucose: negative   (Normal Range: Negative) Bilirubin: negative   (Normal Range: Negative) Ketone: negative   (Normal Range: Negative) Spec. Gravity: 1.010   (Normal Range: 1.003-1.035) Blood: negative   (Normal Range: Negative) pH: 6.0   (Normal Range: 5.0-8.0) Protein: negative   (Normal Range: Negative) Urobilinogen: 0.2   (Normal Range: 0-1) Nitrite: negative   (Normal Range: Negative) Leukocyte Esterace: negative   (Normal Range: Negative)    Comments: Alexis Olson  September 26, 2007 2:08 PM

## 2010-04-26 NOTE — Letter (Signed)
Summary: Patient [facility] Biopsy Results  [facility]  [address] [place], [place] [address]   Phone: [phone]  Fax: [phone]        August 28, 2008 MRN: [id number]    Alexis Olson [address] [place], [place]  [address]    Dear Ms. Westergren,  I am pleased to inform you that the biopsies taken during your recent endoscopic examination did not show any evidence of cancer upon pathologic examination.The tissue from the small intestine appears normal  Additional information/recommendations:  __No further action is needed at this time.  Please follow-up with      your primary care physician for your other healthcare needs.  __ Please call [phone] to schedule a return visit to review      your condition. x __ Continue with the treatment plan as outlined on the day of your      exam.     Please call us if you are having persistent problems or have questions about your condition that have not been fully answered at this time.  Sincerely,  Hart Carwin  This letter has been electronically signed by your physician.

## 2010-04-26 NOTE — Progress Notes (Signed)
Summary: TRIAGE  Phone Note Call from Patient Call back at Work Phone [phone]   Caller: Patient Call For: BRODIE Reason for Call: Talk to Doctor Summary of Call: Patient wants to know if problem she is having may be causing her to be depressed (she just saw Dr Juanda Chance but forgot to ask her)  Initial call taken by: Tawni Levy,  July 13, 2008 3:34 PM  Follow-up for Phone Call        DR.BRODIE PLEASE ADVISE.  [name] LPN  July 13, 2008 3:37 PM Anemia causes weakness and fatigue, contributing to depression. Her depression might have precede her anemia.  Follow-up by: Hart Carwin,  July 13, 2008 10:45 PM  Additional Follow-up for Phone Call Additional follow up Details #1::        Patient advised that anemia could cause weakness and fatigue in turn contributing to depression.  Patient verbalizes understanding. Additional Follow-up by: Hortense Ramal CMA,  July 14, 2008 8:36 AM

## 2010-04-26 NOTE — Progress Notes (Signed)
Summary: REFILL  amtriptyline   Phone Note Refill Request Call back at Home Phone [phone] Message from:  Patient---LIVE CALL  Refills Requested: Medication #1:  AMITRIPTYLINE HCL 100 MG  TABS take 1 at bedtime [facility]  Initial call taken by: Warnell Forester,  May 28, 2009 3:49 PM  Follow-up for Phone Call        ok x 6 per dr Scotty Court  Follow-up by: Pura Spice, RN,  June 01, 2009 10:47 AM    New/Updated Medications: AMITRIPTYLINE HCL 100 MG  TABS (AMITRIPTYLINE HCL) take 1 at bedtime Prescriptions: AMITRIPTYLINE HCL 100 MG  TABS (AMITRIPTYLINE HCL) take 1 at bedtime  #30 x 6   Entered by:   Pura Spice, RN   Authorized by:   Judithann Sheen MD   Signed by:   Pura Spice, RN on 06/01/2009   Method used:   [facility] to        [facility]  [address] [id number]       [address]       [place], [place]  [address]       Ph: [phone] or [phone]       Fax: [phone]   RxID:   [id number]

## 2010-04-26 NOTE — Assessment & Plan Note (Signed)
Summary: UTI? /dm   Vital Signs:  Patient Profile:   77 Years Old Female Weight:      200 pounds O2 Sat:      97 % Temp:     97.6 degrees F Pulse rate:   98 / minute BP sitting:   120 / 80 Cuff size:   large  Vitals Entered By: Pura Spice, RN (November 20, 2007 11:49 AM)                 Chief Complaint:  diarrhea 8-10x day pt says normal for her c/o tired and urinary frequency .  History of Present Illness: pT [place] onset symptoms of cystiti cell phone [phone] had diarrhea for past 8-10 days, tired,urinary b frequency to check electrolytes,cbc and urine no other complaints    Current Allergies (reviewed today): ! DEMEROL ! HYDROCODONE     Review of Systems      See HPI   Physical Exam  General:     Well-developed,well-nourished,in no acute distress; alert,appropriate and cooperative throughout examinationoverweight-appearing.   Head:     Normocephalic and atraumatic without obvious abnormalities. No apparent alopecia or balding. Eyes:     No corneal or conjunctival inflammation noted. EOMI. Perrla. Funduscopic exam benign, without hemorrhages, exudates or papilledema. Vision grossly normal. Ears:     External ear exam shows no significant lesions or deformities.  Otoscopic examination reveals clear canals, tympanic membranes are intact bilaterally without bulging, retraction, inflammation or discharge. Hearing is grossly normal bilaterally. Nose:     External nasal examination shows no deformity or inflammation. Nasal mucosa are pink and moist without lesions or exudates. Mouth:     dry oral mucosa Lungs:     Normal respiratory effort, chest expands symmetrically. Lungs are clear to auscultation, no crackles or wheezes. Heart:     Normal rate and regular rhythm. S1 and S2 normal without gallop, murmur, click, rub or other extra sounds. Abdomen:     increased bowel sounds and generalized tenderness Rectal:      ne Genitalia:     ne    Impression & Recommendations:  Problem # 1:  ENTERITIS (ICD-558.9) Assessment: Improved  Her updated medication list for this problem includes:    Transderm-scop 1.5 Mg Pt72 (Scopolamine base)   Problem # 2:  URINARY URGENCY (YSA-630.16) Assessment: Deteriorated  Orders: UA Dipstick w/o Micro (automated)  (81003) T-Culture, Urine ([address]093-23557)   Problem # 3:  CYSTITIS, ACUTE (ICD-595.0) Assessment: Deteriorated  Her updated medication list for this problem includes:    Pyridium 200 Mg Tabs (Phenazopyridine hcl) .Marland Kitchen... 1 three times a day    Flagyl 250 Mg Tabs (Metronidazole) .Marland Kitchen... 1 by mouth two times a day    Levaquin 500 Mg Tabs (Levofloxacin) .Marland Kitchen... 1 qd   Complete Medication List: 1)  Amitriptyline Hcl 100 Mg Tabs (Amitriptyline hcl) .... Take 1 at bedtime 2)  Potassium Chloride Crys Cr 20 Meq Tbcr (Potassium chloride crys cr) .Marland Kitchen.. 1 qd 3)  Alprazolam 1 Mg Tb24 (Alprazolam) .Marland Kitchen.. 1 hs 4)  Pyridium 200 Mg Tabs (Phenazopyridine hcl) .Marland Kitchen.. 1 three times a day 5)  Prilosec 40 Mg Cpdr (Omeprazole) .Marland Kitchen.. 1 once daily 6)  Neurontin 300 Mg Caps (Gabapentin) .Marland Kitchen.. 1 hs 7)  Ambien 10 Mg Tabs (Zolpidem tartrate) .Marland Kitchen.. 1 hs 8)  Maxzide-25 37.5-25 Mg Tabs (Triamterene-hctz) .Marland Kitchen.. 1 once daily as needed for edema 9)  Hydroxyzine Hcl 25 Mg Tabs (Hydroxyzine hcl) .Marland Kitchen.. 1 by mouth three  times a day as needed itching 10)  Hemocyte Plus 106-1 Mg Caps (B complex-c-min-fe-fa) .... Take 1 by mouth two times a day 11)  Flagyl 250 Mg Tabs (Metronidazole) .Marland Kitchen.. 1 by mouth two times a day 12)  Nystatin 100000 Unit/ml Susp (Nystatin) 13)  Transderm-scop 1.5 Mg Pt72 (Scopolamine base) 14)  Cyanocobalamin 1000 Mcg/ml Inj Soln (Cyanocobalamin) .... Inject 1 ml im once a week for 4 weeks then inject 1 ml once a month 15)  Levaquin 500 Mg Tabs (Levofloxacin) .Marland Kitchen.. 1 qd  Other Orders: Venipuncture (09811) TLB-BMP (Basic Metabolic Panel-BMET) (80048-METABOL) TLB-CBC Platelet -  w/Differential (85025-CBCD)   Patient Instructions: 1)  urinalysis 14 days 2)  Levaquin 500mg  for 14 days 3)  use lomotilif needed 4)  recheck urine in 2 weeks   Prescriptions: LEVAQUIN 500 MG TABS (LEVOFLOXACIN) 1 qd  #14 x 1   Entered and Authorized by:   Judithann Sheen MD   Signed by:   Judithann Sheen MD on 11/20/2007   Method used:   [facility]        [facility]  [address] [id number]       [address]       [place], [place]  [address]       Ph: [phone] or [phone]       Fax: [phone]   RxID:   [id number]  ] Laboratory Results   Urine Tests   Date/Time Reported: November 20, 2007 2:34 PM   Routine Urinalysis   Color: yellow Appearance: Cloudy Glucose: negative   (Normal Range: Negative) Bilirubin: negative   (Normal Range: Negative) Ketone: negative   (Normal Range: Negative) Spec. Gravity: <1.005   (Normal Range: 1.003-1.035) Blood: 1+   (Normal Range: Negative) pH: 5.5   (Normal Range: 5.0-8.0) Protein: trace   (Normal Range: Negative) Urobilinogen: 0.2   (Normal Range: 0-1) Nitrite: positive   (Normal Range: Negative) Leukocyte Esterace: 3+   (Normal Range: Negative)    Comments: Wynona Canes, CMA  November 20, 2007 2:34 PM

## 2010-04-26 NOTE — Progress Notes (Signed)
Summary: TO DR STAFFORD  Phone Note Call from Patient Call back at [phone]   Caller: Patient-LIVE CALL Call For: DR STAFFORD Summary of Call: PT IS RETURNING YOUR CALL ABOUT MEDS. PLEASE CALL HER TODAY. Initial call taken by: Warnell Forester,  October 01, 2007 12:42 PM  Follow-up for Phone Call        per dr Scotty Court flaglyl called  Follow-up by: Pura Spice, RN,  October 02, 2007 8:19 AM    New/Updated Medications: FLAGYL 250 MG  TABS (METRONIDAZOLE) 1 by mouth two times a day

## 2010-04-26 NOTE — Assessment & Plan Note (Signed)
Summary: fuv increase in iron/rb   History of Present Illness Visit Type: Follow-up Visit Primary GI MD: [name] MD Primary Provider: London Sheer Chief Complaint: follow up for decrease in hemoglobin History of Present Illness:   Alexis Olson is a 77 year old white female with chronic GI blood loss most likely from poor iron absorption due to a prior jejunal-ileo bypass which was done in 1974 for obesity.  She also has history of an adenomatous polyp of the colon in 2006.  She has history of a benign distal esophageal stricture dilated in January 2006.  Patient underwent a laparoscopic cholecystectomy in 1993.  We found out she was B12 deficient in December 2005 and she has been on B12 supplements monthly.  A small bowel capsule endoscopy done in 2006 was incomplete since the capsule did not move through the small bowel and stayed stationary for the entire duration of the exam.  Patient has been on chronic iron supplements and has had a good response to oral iron but recently her blood count has dropped from 12 in November to 11.2 on 06/09/08 and to 10.8 last week. Her B12 level was 261 and her iron saturation was 16.9%. The patient has been on iron supplements daily. Her energy level has been decreased. She has never received a parenteral iron infusion.    GI Review of Systems    Reports abdominal pain.      Denies acid reflux, belching, bloating, chest pain, dysphagia with liquids, dysphagia with solids, heartburn, loss of appetite, nausea, vomiting, vomiting blood, weight loss, and  weight gain.      Reports diarrhea.     Denies anal fissure, black tarry stools, change in bowel habit, constipation, diverticulosis, fecal incontinence, heme positive stool, hemorrhoids, irritable bowel syndrome, jaundice, light color stool, liver problems, rectal bleeding, and  rectal pain. Preventive Screening-Counseling & Management     Smoking Status: never    Allergies: 1)  ! Demerol 2)  !  Hydrocodone 3)  ! Ciprofloxacin Hcl (Ciprofloxacin Hcl) 4)  Codeine Phosphate (Codeine Phosphate) 5)  Hydrocodone-Homatropine (Hydrocodone-Homatropine) 6)  Sulfamethoxazole (Sulfamethoxazole) 7)  Zithromax (Azithromycin)  Past History:  Past Medical History:    Asthma    Obesity    DIVERTICULOSIS, COLON (ICD-562.10)    Hx of ESOPHAGEAL STRICTURE (ICD-530.3)    VITAMIN B12 DEFICIENCY (ICD-266.2)    COLONIC POLYPS, ADENOMATOUS, HX OF (ICD-V12.72)    ANEMIA (ICD-285.9)    ACUTE PHARYNGITIS (ICD-462)    LARYNGITIS, ACUTE (ICD-464.00)    NUMBNESS (ICD-782.0)    UTI (ICD-599.0)    ENTERITIS (ICD-558.9)    URINARY URGENCY (ICD-788.63)    BACK PAIN (ICD-724.5)    ANXIETY (ICD-300.00)    BARIATRIC SURGERY STATUS (ICD-V45.86)    FATIGUE (ICD-780.79)    INSOMNIA (ICD-780.52)    ANEMIA, CHRONIC (ICD-281.9)    CYSTITIS, ACUTE (ICD-595.0)    BACK PAIN (ICD-724.5)    GASTROINTESTINAL HEMORRHAGE, HX OF (ICD-V12.79)    ASTHMA (ICD-493.90)    ALLERGIC RHINITIS (ICD-477.9)     (07/09/2008)  Past Surgical History:    Cholecystectomy    Ileojejunal Bypass     Tonsillectomy    Back surgery-ruptured disk (07/09/2008)  Family History:    No FH of Colon Cancer:    Family History of Liver Disease/Cirrhosis:    Family History of Kidney Disease: sister  Social History:    Illicit Drug Use - no    Patient has never smoked.     Alcohol Use - no    Daily  Caffeine Use  diet coke  and mt dew some coffee    Smoking Status:  never  Review of Systems       The patient complains of allergy/sinus, anemia, and fever.    Vital Signs:  Patient profile:   77 year old female Height:      65 inches Weight:      206.38 pounds BMI:     34.47 BSA:     2.01 Pulse rate:   88 / minute Pulse rhythm:   regular BP sitting:   122 / 76  (left arm)  Vitals Entered By: Merri Ray CMA (July 13, 2008 2:17 PM)  Physical Exam  General:  overweight. Alert and oriented, palmar erythema. Neck:   Supple; no masses or thyromegaly. Lungs:  Clear throughout to auscultation. Heart:  Regular rate and rhythm; no murmurs, rubs or bruits. Abdomen:  well-healed surgical scars. No tenderness. Normoactive bowel sounds. Rectal:  soft hemoccult-positive stool. Extremities:  trace pedal edema. Psych:  Alert and cooperative. Normal mood and affect.   Impression & Recommendations:  Problem # 1:  ANEMIA (ICD-285.9) iron deficiency anemia due to a combination of iron malabsorption due to  previous  ileojejunal bypass and GI blood loss which has not been defined precisely after having endoscopy, colonoscopy and small bowel capsule endoscopy. She has a history of B12 deficiency and has been on B12 supplements. Her hemoglobin has continued to drop despite oral iron supplements. We will schedule her for a parenteral iron infusion.  Problem # 2:  Hx of ESOPHAGEAL STRICTURE (ICD-530.3) last upper endoscopy was in January 2006. Her small bowel biopsy was negative. No symptoms suggestive of a recurrent stricture.  Problem # 3:  DIVERTICULOSIS, COLON (ICD-562.10) last colonoscopy was in January 2006. Patient had a tubulovillous adenoma of the cecum. Her next colonoscopy will be due in January 2011.  Problem # 4:  COLONIC POLYPS, ADENOMATOUS, HX OF (ICD-V12.72) recall colonoscopy due in January 2011.  Other Orders: Iron Infusion (Iron Inf)  Patient Instructions: 1)  schedule iron infusion including a test dose of 25 mg Imfed  IV. Patient to take 30 mg prednisone day before infusion . Prior to infusion, pt will be given Solumedrol 20 mg  IV in addition to Benadryl 25 mg IV  and Tylenol 650 mg x 2 tablets. 2)  continue oral iron. 3)  Repeat CBC 2 weeks following infusion 4)  B12 1000 mcg IM monthly 5)  Recall colonoscopy 03/2009 6)  Copy sent to : Dr Jeral Fruit, Dr.W.Stafford Prescriptions: PREDNISONE 10 MG TABS (PREDNISONE) Please take 3 tablets (30 mg) day before infed infusion. Take 2 tablets day of infed  infusion as per Dr Juanda Chance.  #5 x 0   Entered by:   Hortense Ramal CMA   Authorized by:   Hart Carwin   Signed by:   Hortense Ramal CMA on 07/13/2008   Method used:   [facility]  [address] [id number]       [address]       [place], [place]  [address]       Ph: [phone] or [phone]       Fax: [phone]   RxID:   [id number]

## 2010-04-26 NOTE — Miscellaneous (Signed)
[facility]: [facility]   Imported By: Margie Billet 06/22/2009 09:04:51  _____________________________________________________________________  External Attachment:    Type:   Image     Comment:   External Document

## 2010-04-26 NOTE — Progress Notes (Signed)
Summary: Prior Authorization  Phone Note Outgoing Call   Summary of Call: Left message for pt to return my call. Prior authoriztion request for Omeprazole and per MD pt hasn't been seen in over a year for heartburn. Pt needs to come in for office visit or can buy Omeprazole over the counter. Initial call taken by: Josph Macho RMA,  November 19, 2009 4:35 PM  Follow-up for Phone Call        Left another message for pt to return my call. Follow-up by: Josph Macho RMA,  November 22, 2009 9:26 AM  Additional Follow-up for Phone Call Additional follow up Details #1::        Pt states she is going to call Dr Karma Ganja office to see if she will fill the RX Additional Follow-up by: Josph Macho RMA,  November 22, 2009 12:21 PM

## 2010-04-26 NOTE — Progress Notes (Signed)
Summary: wants lab  Phone Note Call from Patient Call back at [phone]   Caller: pt live Call For: [facility] Summary of Call: Patient would like to have a hemaglobin check, is this ok and I need a dx code. Initial call taken by: Celine Ahr,  January 21, 2008 1:26 PM  Follow-up for Phone Call        ok  285.9  code  thanks Follow-up by: Pura Spice, RN,  January 21, 2008 1:51 PM  Additional Follow-up for Phone Call Additional follow up Details #1::        appt scheduled  Roselle Locus  January 21, 2008 2:26 PM

## 2010-04-26 NOTE — Progress Notes (Signed)
Summary: having biopsy Thurs  Phone Note Call from Patient   Caller: Patient Call For: Judithann Sheen MD Summary of Call: wants to tell Sr Scotty Court about visit to Inf control doctor. Having biospy Thursday. Please call her [phone]. Initial call taken by: Raechel Ache, RN,  May 11, 2009 4:23 PM  Follow-up for Phone Call        will talk with patient, to have biopsy of spiam lesio n Follow-up by: Judithann Sheen MD,  May 12, 2009 1:44 PM

## 2010-04-26 NOTE — Assessment & Plan Note (Signed)
Summary: New PT/ABSCESS L1/L2 Space/KDW   Referring Provider:  n/a Primary Provider:  London Sheer  CC:  new patient - abscess L1/L2.  History of Present Illness: 77 yo F with a hx of L2-L3 discectomy 1994. In 2009 she had "nerve injections" into her back due to back pain.  She has had worsening back pain recently (over last 3 and 1/2 weeks)  and underwent MRI (Apr 28, 2009) showing 1.  Significant marrow signal changes and abnormal enhancement about the L1-2 disc space, predominately in the inferior aspect of L1 with erosion into the inferior endplates.  This is most concerning for disc osteomyelitis. Reactive changes and a large Schmorl's node without infection are considered less likely. 2.  Abnormal soft tissue within the left lateral recess posterior to the L1 vertebral body.  This is most compatible with a small epidural abscess resulting in left lateral recess narrowing and potentially affecting the left L1 and L2 nerve roots.  Alternately, this could represent extruded disc material.  The latter explanation does not explain the peripheral enhancement. 3.  Abnormal enhancement of the paraspinal soft tissues about L1-2 also favors infection. 4.  Progressive retrolisthesis at L1-2 contributes to the lateral recess narrowing. 5.  Spondylosis of the lumbar spine at the L2-3 level and below is stable. No fevers (temp has gone to 98-99)or chills, has had pain with getting in and out of her car which has improved (due to ?). nl bm, nl urination. Recently put on septra for UTI.  Had WBC of 8 and ESR of 28 recently.   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) 4)  Diverticulosis, Colon  (ICD-562.10) 5)  Hx of Esophageal Stricture  (ICD-530.3) 6)  Vitamin B12 Deficiency  (ICD-266.2) 7)  Colonic Polyps, Adenomatous, Hx of  (ICD-V12.72) 8)  Anemia  (ICD-285.9) 9)  Acute Pharyngitis   (ICD-462) 10)  Laryngitis, Acute  (ICD-464.00) 11)  Numbness  (ICD-782.0) 12)  Uti  (ICD-599.0) 13)  Enteritis  (ICD-558.9) 14)  Urinary Urgency  (ICD-788.63) 15)  Back Pain  (ICD-724.5) 16)  Anxiety  (ICD-300.00) 17)  Bariatric Surgery Status  (ICD-V45.86) 18)  Fatigue  (ICD-780.79) 19)  Insomnia  (ICD-780.52) 20)  Anemia, Chronic  (ICD-281.9) 21)  Cystitis, Acute  (ICD-595.0) 22)  Back Pain  (ICD-724.5) 23)  Gastrointestinal Hemorrhage, Hx of  (ICD-V12.79) 24)  Asthma  (ICD-493.90) 25)  Allergic Rhinitis  (ICD-477.9)  Medications Prior to Update: 1)  Fexofenadine Hcl 180 Mg Tabs (Fexofenadine Hcl) .... Take 1 Tablet By Mouth Once A Day 2)  Flonase 50 Mcg/act Susp (Fluticasone Propionate) .... Spray 1 Spray Into Both Nostrils Once A Day 3)  Bl Vitamin E 400 Unit  Caps (Vitamin E) .... Once Daily 4)  Advair Diskus 250-50 Mcg/dose  Misc (Fluticasone-Salmeterol) .... Uses As Needed 5)  Omeprazole 20 Mg  Tbec (Omeprazole) .Marland Kitchen.. 1 By Mouth Once Daily 6)  Valtrex 500 Mg  Tabs (Valacyclovir Hcl) .... Prn 7)  Amitiza 24 Mcg  Caps (Lubiprostone) .... Once Daily 8)  Mag-Ox 400 400 Mg  Tabs (Magnesium Oxide) .... Two Once Daily 9)  Ambien 10 Mg  Tabs (Zolpidem Tartrate) .... On By Mouth Q Hs As Needed 10)  Amitriptyline Hcl 100 Mg  Tabs (Amitriptyline Hcl) .... Take 1 At Bedtime 11)  Potassium Chloride Crys Cr 20 Meq  Tbcr (Potassium Chloride Crys Cr) .Marland Kitchen.. 1 Qd 12)  Pyridium 200 Mg  Tabs (Phenazopyridine Hcl) .Marland Kitchen.. 1 Three Times A Day 13)  Maxzide-25 37.5-25 Mg  Tabs (Triamterene-Hctz) .Marland Kitchen.. 1 Once Daily As Needed For Edema 14)  Hydroxyzine Hcl 25 Mg  Tabs (Hydroxyzine Hcl) .Marland Kitchen.. 1 By Mouth Three Times A Day As Needed Itching 15)  Hemocyte Plus 106-1 Mg  Caps (B Complex-C-Min-Fe-Fa) .... Take 1 By Mouth Two Times A Day 16)  Cyanocobalamin 1000 Mcg/ml Inj Soln (Cyanocobalamin) .... Inject 1 Ml Im Once A Week For 4 Weeks Then Inject 1 Ml Once A Month 17)  Flexeril 10 Mg Tabs (Cyclobenzaprine Hcl)  .Marland Kitchen.. 1 Three Times A Day For Muscle Spasm 18)  Alprazolam 1 Mg Tabs (Alprazolam) .Marland Kitchen.. 1 Three Times A Day As Needed Stress Needs Office Visit 19)  Optivar 0.05 % Soln (Azelastine Hcl) .Marland Kitchen.. 1 Drop  Each  Eye Bid 20)  Lyrica 50 Mg Caps (Pregabalin) .Marland Kitchen.. 1 Two Times A Day 21)  Prednisone 10 Mg  Tabs (Prednisone) .... Take 3 (30mg ) By Mouth The Day Before Iron Infusion. 22)  Klor-Con M20 20 Meq Cr-Tabs (Potassium Chloride Crys Cr) .Marland Kitchen.. 1 By Mouth Once Daily  Needs Office Visit 23)  Bentyl 20 Mg Tabs (Dicyclomine Hcl) .... Take One By Mouth 3 Times Daily As Needed 24)  Levaquin 750 Mg Tabs (Levofloxacin) .Marland Kitchen.. 1 Once Daily For Infection 25)  Nabumetone 750 Mg Tabs (Nabumetone) .Marland Kitchen.. 1 By Mouth Two Times A Day 26)  Tramadol Hcl 50 Mg Tabs (Tramadol Hcl) .Marland Kitchen.. 1 or 2 Evry 4 -6 Hrs As Needed For Back Pain 27)  Septra Ds 800-160 Mg Tabs (Sulfamethoxazole-Trimethoprim) .Marland Kitchen.. 1 Two Times A Day For Uti  Current Medications (verified): 1)  Fexofenadine Hcl 180 Mg Tabs (Fexofenadine Hcl) .... Take 1 Tablet By Mouth Once A Day 2)  Flonase 50 Mcg/act Susp (Fluticasone Propionate) .... Spray 1 Spray Into Both Nostrils Once A Day 3)  Bl Vitamin E 400 Unit  Caps (Vitamin E) .... Once Daily 4)  Advair Diskus 250-50 Mcg/dose  Misc (Fluticasone-Salmeterol) .... Uses As Needed 5)  Omeprazole 20 Mg  Tbec (Omeprazole) .Marland Kitchen.. 1 By Mouth Once Daily 6)  Valtrex 500 Mg  Tabs (Valacyclovir Hcl) .... Prn 7)  Amitiza 24 Mcg  Caps (Lubiprostone) .... Once Daily 8)  Mag-Ox 400 400 Mg  Tabs (Magnesium Oxide) .... Two Once Daily 9)  Ambien 10 Mg  Tabs (Zolpidem Tartrate) .... On By Mouth Q Hs As Needed 10)  Amitriptyline Hcl 100 Mg  Tabs (Amitriptyline Hcl) .... Take 1 At Bedtime 11)  Potassium Chloride Crys Cr 20 Meq  Tbcr (Potassium Chloride Crys Cr) .Marland Kitchen.. 1 Qd 12)  Hydroxyzine Hcl 25 Mg  Tabs (Hydroxyzine Hcl) .Marland Kitchen.. 1 By Mouth Three Times A Day As Needed Itching 13)  Hemocyte Plus 106-1 Mg  Caps (B Complex-C-Min-Fe-Fa) .... Take 1  By Mouth Two Times A Day 14)  Cyanocobalamin 1000 Mcg/ml Inj Soln (Cyanocobalamin) .... Inject 1 Ml Im Once A Week For 4 Weeks Then Inject 1 Ml Once A Month 15)  Flexeril 10 Mg Tabs (Cyclobenzaprine Hcl) .Marland Kitchen.. 1 Three Times A Day For Muscle Spasm 16)  Alprazolam 1 Mg Tabs (Alprazolam) .Marland Kitchen.. 1 Three Times A Day As Needed Stress Needs Office Visit 17)  Optivar 0.05 % Soln (Azelastine Hcl) .Marland Kitchen.. 1 Drop  Each  Eye Bid 18)  Lyrica 50 Mg Caps (Pregabalin) .Marland Kitchen.. 1 Two Times A Day 19)  Prednisone 10 Mg  Tabs (Prednisone) .... Take 3 (30mg ) By Mouth The Day Before Iron Infusion. 20)  Klor-Con M20 20 Meq Cr-Tabs (Potassium Chloride Crys Cr) .Marland Kitchen.. 1 By Mouth Once Daily  Needs Office Visit 21)  Bentyl 20 Mg Tabs (Dicyclomine Hcl) .... Take One By Mouth 3 Times Daily As Needed 22)  Nabumetone 750 Mg Tabs (Nabumetone) .Marland Kitchen.. 1 By Mouth Two Times A Day 23)  Tramadol Hcl 50 Mg Tabs (Tramadol Hcl) .Marland Kitchen.. 1 or 2 Evry 4 -6 Hrs As Needed For Back Pain 24)  Septra Ds 800-160 Mg Tabs (Sulfamethoxazole-Trimethoprim) .Marland Kitchen.. 1 Two Times A Day For Uti  Allergies: 1)  ! Demerol 2)  ! Hydrocodone 3)  ! Ciprofloxacin Hcl (Ciprofloxacin Hcl) 4)  Codeine Phosphate (Codeine Phosphate) 5)  Hydrocodone-Homatropine (Hydrocodone-Homatropine) 6)  Zithromax (Azithromycin)   Preventive Screening-Counseling & Management  Alcohol-Tobacco     Alcohol drinks/day: 0     Smoking Status: quit > 6 months     Packs/Day: <0.25     Year Started: 1950     Year Quit: 1980  Caffeine-Diet-Exercise     Caffeine use/day: sodas     Does Patient Exercise: no  Safety-Violence-Falls     Seat Belt Use: yes   Current Allergies (reviewed today): ! DEMEROL ! HYDROCODONE ! CIPROFLOXACIN HCL (CIPROFLOXACIN HCL) CODEINE PHOSPHATE (CODEINE PHOSPHATE) HYDROCODONE-HOMATROPINE (HYDROCODONE-HOMATROPINE) ZITHROMAX (AZITHROMYCIN) Family History: No FH of Colon Cancer: Family History of Liver Disease/Cirrhosis:Sister Family History of Kidney Disease:  Sister Cervical Cancer: Mother husband is PPD+.   Review of Systems       see hpi, wt steady ("terrible")  Vital Signs:  Patient profile:   77 year old female Height:      65 inches (165.10 cm) Weight:      205.3 pounds (93.32 kg) BMI:     34.29 Temp:     96.9 degrees F (36.06 degrees C) oral Pulse rate:   93 / minute BP sitting:   118 / 70  (left arm)  Vitals Entered By: Baxter Hire) (May 05, 2009 3:06 PM) CC: new patient - abscess L1/L2 Is Patient Diabetic? No Pain Assessment Patient in pain? no      Nutritional Status BMI of > 30 = obese Nutritional Status Detail appetite is good per patient  Does patient need assistance? Functional Status Self care Ambulation Normal   Physical Exam  General:  well-developed, well-nourished, well-hydrated, and overweight-appearing.   Eyes:  pupils equal, pupils round, and pupils reactive to light.   Mouth:  pharynx pink and moist and no exudates.   Neck:  no masses.   Lungs:  normal respiratory effort and normal breath sounds.   Heart:  normal rate, regular rhythm, and no murmur.   Abdomen:  soft, non-tender, and normal bowel sounds.   Extremities:  no edema Neurologic:  alert & oriented X3 and strength normal in all extremities.  nl relfexes UE.    Detailed Back/Spine Exam  General:    nontender to palpation over lumbar spine. no dicoloration or fluctance.    Impression & Recommendations:  Problem # 1:  OSTEOMYELITIS, VERTEBRA (ICD-730.28)  She is scheduled for FNA of L1 next week to obtain Dr Orest Dikes or Hassle to perform the procedure. Will check her BCx  and CBC, ESR, CRP today. Unfortunately, her Cx (today and at FNA) may be negative due to her current use of antibiotics. Hopefully she will complete her antibiotics prior to the procedure.  We may be left with empiric antibiotics. For now, as she does not appear ill, no fever, will leave her off antibiotics til we have the  result of her cx's. return to clinic 2  weeks.   Orders: T-C-Reactive Protein [id number]) T-CBC w/Diff [phone]) T-Sed Rate (Automated) [phone]) T-Culture, Blood Routine ([id number]) Consultation Level IV (31517) T-Culture, Blood Routine ([id number])

## 2010-04-26 NOTE — Assessment & Plan Note (Signed)
Summary: hoarseness/congestion/jr   Vital Signs:  Patient Profile:   77 Years Old Female Weight:      211 pounds O2 Sat:      97 % Temp:     98.3 degrees F Pulse rate:   90 / minute BP sitting:   130 / 82  (left arm)                 Chief Complaint:  congestion and hoarsness.  History of Present Illness: Pt nhas had onset sore throat and hoaseness over past 2-3 days cough, productive slightly, yellow green general malaise, aching to check on appt DSr. Grapey regarding persistant infection urinary recent chest Xray negative    Current Allergies: ! DEMEROL ! HYDROCODONE ! CIPROFLOXACIN HCL (CIPROFLOXACIN HCL) CODEINE PHOSPHATE (CODEINE PHOSPHATE) HYDROCODONE-HOMATROPINE (HYDROCODONE-HOMATROPINE) SULFAMETHOXAZOLE (SULFAMETHOXAZOLE) ZITHROMAX (AZITHROMYCIN)     Review of Systems      See HPI  General      Denies chills, fatigue, fever, loss of appetite, malaise, sleep disorder, sweats, weakness, and weight loss.  ENT      Complains of hoarseness and nasal congestion.      Denies decreased hearing, difficulty swallowing, ear discharge, earache, nosebleeds, postnasal drainage, ringing in ears, sinus pressure, and sore throat.  CV      Denies bluish discoloration of lips or nails, chest pain or discomfort, difficulty breathing at night, difficulty breathing while lying down, fainting, fatigue, leg cramps with exertion, lightheadness, near fainting, palpitations, shortness of breath with exertion, swelling of feet, swelling of hands, and weight gain.  Resp      Complains of cough.  GI      Denies abdominal pain, bloody stools, change in bowel habits, constipation, dark tarry stools, diarrhea, excessive appetite, gas, hemorrhoids, indigestion, loss of appetite, nausea, vomiting, vomiting blood, and yellowish skin color.  GU      See HPI   Physical Exam  General:     Well-developed,well-nourished,in no acute distress; alert,appropriate and cooperative  throughout examinationoverweight-appearing.   Head:     Normocephalic and atraumatic without obvious abnormalities. No apparent alopecia or balding. Eyes:     No corneal or conjunctival inflammation noted. EOMI. Perrla. Funduscopic exam benign, without hemorrhages, exudates or papilledema. Vision grossly normal. Ears:     External ear exam shows no significant lesions or deformities.  Otoscopic examination reveals clear canals, tympanic membranes are intact bilaterally without bulging, retraction, inflammation or discharge. Hearing is grossly normal bilaterally. Nose:     nasal congestion Mouth:     pharyngeal erythema.   Lungs:     rhonchi bilaterally Heart:     Normal rate and regular rhythm. S1 and S2 normal without gallop, murmur, click, rub or other extra sounds.    Impression & Recommendations:  Problem # 1:  LARYNGITIS, ACUTE (ICD-464.00) Assessment: New  Orders: Depo- Medrol 80mg  (J1040) Admin of Therapeutic Inj  intramuscular or subcutaneous (93235) Admin of Therapeutic Inj  intramuscular or subcutaneous (57322)   Problem # 2:  ACUTE PHARYNGITIS (ICD-462) Assessment: New  Her updated medication list for this problem includes:    Levaquin 750 Mg Tabs (Levofloxacin) ..... Once daily    Flagyl 250 Mg Tabs (Metronidazole) .Marland Kitchen... 1 by mouth two times a day    Nystatin 100000 Unit/ml Susp (Nystatin)    Levaquin 500 Mg Tabs (Levofloxacin) .Marland Kitchen... 1 qd    Levaquin 500 Mg Tabs (Levofloxacin) .Marland Kitchen... 1 qd   Problem # 3:  ALLERGIC RHINITIS (ICD-477.9) Assessment: Unchanged  Her updated medication list for  this problem includes:    Fexofenadine Hcl 180 Mg Tabs (Fexofenadine hcl) .Marland Kitchen... Take 1 tablet by mouth once a day    Flonase 50 Mcg/act Susp (Fluticasone propionate) ..... Spray 1 spray into both nostrils once a day    Astelin 137 Mcg/spray Soln (Azelastine hcl) .Marland Kitchen... At bedtime    Xyzal 5 Mg Tabs (Levocetirizine dihydrochloride)   Problem # 4:  UTI  (ICD-599.0) Assessment: Unchanged  Her updated medication list for this problem includes:    Phenazopyridine Hcl 200 Mg Tabs (Phenazopyridine hcl) .Marland Kitchen... Take 1 tablet by mouth twice a day    Vesicare 5 Mg Tabs (Solifenacin succinate) ..... Once daily    Levaquin 750 Mg Tabs (Levofloxacin) ..... Once daily    Pyridium 200 Mg Tabs (Phenazopyridine hcl) .Marland Kitchen... 1 three times a day    Flagyl 250 Mg Tabs (Metronidazole) .Marland Kitchen... 1 by mouth two times a day    Levaquin 500 Mg Tabs (Levofloxacin) .Marland Kitchen... 1 qd    Macrobid 100 Mg Caps (Nitrofurantoin monohyd macro) .Marland Kitchen... 1 two times a day for uti    Levaquin 500 Mg Tabs (Levofloxacin) .Marland Kitchen... 1 qd   Complete Medication List: 1)  Amitriptyline Hcl 100 Mg Tabs (Amitriptyline hcl) .... Take 1 tablet by mouth at bedtime 2)  Fexofenadine Hcl 180 Mg Tabs (Fexofenadine hcl) .... Take 1 tablet by mouth once a day 3)  Flonase 50 Mcg/act Susp (Fluticasone propionate) .... Spray 1 spray into both nostrils once a day 4)  Phenazopyridine Hcl 200 Mg Tabs (Phenazopyridine hcl) .... Take 1 tablet by mouth twice a day 5)  Proferrin-forte 12-1 Mg Tabs (Fe heme polypeptide-folic acid) .... Take 6)  Bl Vitamin E 400 Unit Caps (Vitamin e) .... Once daily 7)  Advair Diskus 250-50 Mcg/dose Misc (Fluticasone-salmeterol) .... Uses as needed 8)  Astelin 137 Mcg/spray Soln (Azelastine hcl) .... At bedtime 9)  Neurontin 300 Mg Caps (Gabapentin) .... 0ne two times a day-tid 10)  Omeprazole 20 Mg Tbec (Omeprazole) .Marland Kitchen.. 1 by mouth once daily  needs to call for appt 11)  Valtrex 500 Mg Tabs (Valacyclovir hcl) .... Prn 12)  Amitiza 24 Mcg Caps (Lubiprostone) .... Once daily 13)  Vesicare 5 Mg Tabs (Solifenacin succinate) .... Once daily 14)  K-tabs 10 Meq Tbcr (Potassium chloride) .... Once daily 15)  Mag-ox 400 400 Mg Tabs (Magnesium oxide) .... Two once daily 16)  Darvocet-n 100 100-650 Mg Tabs (Propoxyphene n-apap) .... As needed 17)  Levaquin 750 Mg Tabs (Levofloxacin) .... Once  daily 18)  Ambien 10 Mg Tabs (Zolpidem tartrate) .... On by mouth q hs prn 19)  Amitriptyline Hcl 100 Mg Tabs (Amitriptyline hcl) .... Take 1 at bedtime 20)  Potassium Chloride Crys Cr 20 Meq Tbcr (Potassium chloride crys cr) .Marland Kitchen.. 1 qd 21)  Alprazolam 1 Mg Tb24 (Alprazolam) .Marland Kitchen.. 1 hs 22)  Pyridium 200 Mg Tabs (Phenazopyridine hcl) .Marland Kitchen.. 1 three times a day 23)  Prilosec 40 Mg Cpdr (Omeprazole) .Marland Kitchen.. 1 once daily 24)  Neurontin 300 Mg Caps (Gabapentin) .Marland Kitchen.. 1 hs 25)  Ambien 10 Mg Tabs (Zolpidem tartrate) .Marland Kitchen.. 1 hs 26)  Maxzide-25 37.5-25 Mg Tabs (Triamterene-hctz) .Marland Kitchen.. 1 once daily as needed for edema 27)  Hydroxyzine Hcl 25 Mg Tabs (Hydroxyzine hcl) .Marland Kitchen.. 1 by mouth three times a day as needed itching 28)  Hemocyte Plus 106-1 Mg Caps (B complex-c-min-fe-fa) .... Take 1 by mouth two times a day 29)  Flagyl 250 Mg Tabs (Metronidazole) .Marland Kitchen.. 1 by mouth two times a day 30)  Nystatin 100000 Unit/ml Susp (Nystatin) 31)  Transderm-scop 1.5 Mg Pt72 (Scopolamine base) 32)  Cyanocobalamin 1000 Mcg/ml Inj Soln (Cyanocobalamin) .... Inject 1 ml im once a week for 4 weeks then inject 1 ml once a month 33)  Levaquin 500 Mg Tabs (Levofloxacin) .Marland Kitchen.. 1 qd 34)  Xyzal 5 Mg Tabs (Levocetirizine dihydrochloride) 35)  Macrobid 100 Mg Caps (Nitrofurantoin monohyd macro) .Marland Kitchen.. 1 two times a day for uti 36)  Ambien 10 Mg Tabs (Zolpidem tartrate) .Marland Kitchen.. 1 hs for insomnia 37)  Flexeril 10 Mg Tabs (Cyclobenzaprine hcl) .Marland Kitchen.. 1 three times a day for muscle spasm 38)  Alprazolam 1 Mg Tabs (Alprazolam) .Marland Kitchen.. 1 three times a day as needed stress 39)  Optivar 0.05 % Soln (Azelastine hcl) .Marland Kitchen.. 1 drop  each  eye bid 40)  Lyrica 50 Mg Caps (Pregabalin) .Marland Kitchen.. 1 two times a day 41)  Levaquin 500 Mg Tabs (Levofloxacin) .Marland Kitchen.. 1 qd   Patient Instructions: 1)  tx with levaquin 500mg  once daily x 7 2)  Depomedrol 120 mg IM   Prescriptions: LEVAQUIN 500 MG TABS (LEVOFLOXACIN) 1 qd  #10 x 0   Entered and Authorized by:   Judithann Sheen MD   Signed by:   Judithann Sheen MD on 04/17/2008   Method used:   Samples Given   RxID:   [id number]    Medication Administration  Injection # 1:    Medication: Depo- Medrol 80mg     Diagnosis: LARYNGITIS, ACUTE (ICD-464.00)    Route: IM    Site: RUOQ gluteus    Exp Date: 02/11    Lot #: [id number]    Mfr: Teva    Comments: 120 mg    Patient tolerated injection without complications    Given by: Judithann Sheen MD (April 17, 2008 5:10 PM)  Orders Added: 1)  Depo- Medrol 80mg  [J1040] 2)  Admin of Therapeutic Inj  intramuscular or subcutaneous [96372] 3)  Admin of Therapeutic Inj  intramuscular or subcutaneous [96372] 4)  Est. Patient Level IV [21308]

## 2010-04-26 NOTE — Progress Notes (Signed)
Summary: wants hgb recheck also  Phone Note Call from Patient Call back at [phone]   Caller: live Call For: stafford Summary of Call: Is doubling the iron for 2 weeks now & is still extremely tired.  Wants to know if she can get her hemoglobin rechecked tomorrow also when she comes for the repeat urinalysis.  Says hemoglobin was 9 when checked 2 weeks ago. Initial call taken by: Rudy Jew, RN,  December 04, 2007 3:35 PM  Follow-up for Phone Call        ok  order in IDX   Follow-up by: Pura Spice, RN,  December 04, 2007 3:44 PM  Additional Follow-up for Phone Call Additional follow up Details #1::        Patient aware. Additional Follow-up by: Rudy Jew, RN,  December 04, 2007 4:07 PM

## 2010-04-26 NOTE — Progress Notes (Signed)
Summary: Not sure if she want to start IV Atbx.  Phone Note Call from Patient   Summary of Call: Pt called the office. She is not sure if she will start the IV antibiotics?  She has called Dr Cassandria Santee office and was told by the nurse she should follow the recommendations  of the ID physician but she thinks since Dr Jeral Fruit did the biopsy he should speak with her. I spent 10 min. on the phone with patient as she discussed all the reasons she should not start the IV Atbx. and the need to speak with  Dr Jeral Fruit.   Finally I told the patient she must have this discussion with the nurse at that office or the office manager.  PICC was to be placed on tomorrow. If she is not going to make the scheduled visit she should call and cancel the appt.  Laurell Josephs, RN

## 2010-04-26 NOTE — Consult Note (Signed)
Summary: New Pt. Referral: [facility]  New Pt. Referral: [facility]   Imported By: Florinda Marker 05/06/2009 16:08:19  _____________________________________________________________________  External Attachment:    Type:   Image     Comment:   External Document

## 2010-04-26 NOTE — Procedures (Signed)
Summary: Colonoscopy   Colonoscopy  Procedure date:  08/25/2008  Findings:      Location:  [facility].    Procedures Next Due Date:    Colonoscopy: 08/2018  COLONOSCOPY PROCEDURE REPORT  PATIENT:  Alexis Olson, Alexis Olson  MR#:  [id number] BIRTHDATE:   Sep 10, 1933, 75 yrs. old   GENDER:   female  ENDOSCOPIST:   Hedwig Morton. Juanda Chance, MD Referred by:   PROCEDURE DATE:  08/25/2008 PROCEDURE:  Colonoscopy, diagnostic ASA CLASS:   Class II INDICATIONS: heme positive stool, Iron Deficiency Anemia last colonoscopy 2006, tubovillous adenoma  MEDICATIONS:    Versed 3 mg, Fentanyl 25 mcg  DESCRIPTION OF PROCEDURE:   After the risks benefits and alternatives of the procedure were thoroughly explained, informed consent was obtained.  Digital rectal exam was performed and revealed no rectal masses.   The LB [id number] endoscope was introduced through the anus and advanced to the cecum, which was identified by both the appendix and ileocecal valve, without limitations.  The quality of the prep was good, using MiraLax.  The instrument was then slowly withdrawn as the colon was fully examined. <<PROCEDUREIMAGES>>              <<OLD IMAGES>>  FINDINGS:  Mild diverticulosis was found (see image1).  This was otherwise a normal examination of the colon (see image2, image4, image5, image6, image8, and image7). no endoscopic evidence of UC   Retroflexed views in the rectum revealed no abnormalities.    The scope was then withdrawn from the patient and the procedure completed.  COMPLICATIONS:   None  ENDOSCOPIC IMPRESSION:  1) Mild diverticulosis  2) Otherwise normal examination  nothing to account for iron def. anemia  s/p remote jejunoileal bypass, suspect iron malabsorption, possible blood loss from the anastomosis which is located distal from today's exam RECOMMENDATIONS:  Iron po and regular Iron infusions  REPEAT EXAM:   In 10 year(s)  for.   _______________________________ Hedwig Morton. Juanda Chance, MD  CC: Dianna Limbo, MD

## 2010-04-26 NOTE — Letter (Signed)
Summary: allergy note  allergy note   Imported By: Kassie Mends 01/02/2007 15:07:18  _____________________________________________________________________  External Attachment:    Type:   Image     Comment:   allergy note

## 2010-04-26 NOTE — Medication Information (Signed)
Summary: Refill Request for Omeprazole  Refill Request for Omeprazole   Imported By: Maryln Gottron 09/16/2009 14:53:04  _____________________________________________________________________  External Attachment:    Type:   Image     Comment:   External Document

## 2010-04-26 NOTE — Progress Notes (Signed)
Summary: diagnosis codes  Phone Note Other Incoming   Caller: Kenney Houseman from [employer] of Call: Kenney Houseman called from [employer] about diagnosis codes for BUN and Creatinine. # is [phone]. [phone].  Initial call taken by: Kathi Simpers [facility]),  Jul 29, 2009 3:24 PM  Follow-up for Phone Call        Rad,. called to request New order needed for MRI stating lumbar and to include with and without . Fax [phone]

## 2010-04-26 NOTE — Letter (Signed)
Summary: [facility]  [facility]   Imported By: Maryln Gottron 05/25/2008 15:00:54  _____________________________________________________________________  External Attachment:    Type:   Image     Comment:   External Document

## 2010-04-26 NOTE — Assessment & Plan Note (Signed)
Summary: reck on lyrica   Vital Signs:  Patient Profile:   77 Years Old Female Weight:      209 pounds O2 Sat:      89 % Temp:     97.6 degrees F Pulse rate:   100 / minute BP sitting:   122 / 80                 Chief Complaint:  Discuss labwork and antibiotics.  History of Present Illness: Pt in to discuss tx with lyrica for back pain, does help pain but also gaining weight and edema BP under control In to recheck urinary infection and has positive urine 3plus, nitrite positive , has een on Levaquin and then macrobid 100 bid complains conjunctivitis, most likely allergic conjunctivitis for 3 days should mention pt felt badly and more pain when off lyrica,    Current Allergies: ! DEMEROL ! HYDROCODONE ! CIPROFLOXACIN HCL (CIPROFLOXACIN HCL) CODEINE PHOSPHATE (CODEINE PHOSPHATE) HYDROCODONE-HOMATROPINE (HYDROCODONE-HOMATROPINE) SULFAMETHOXAZOLE (SULFAMETHOXAZOLE) ZITHROMAX (AZITHROMYCIN)     Review of Systems      See HPI  GI      Complains of diarrhea.      gastric bypass  GU      See HPI  MS      See HPI      Complains of joint pain.   Physical Exam  General:     Well-developed,well-nourished,in no acute distress; alert,appropriate and cooperative throughout examination Lungs:     Normal respiratory effort, chest expands symmetrically. Lungs are clear to auscultation, no crackles or wheezes. Heart:     Normal rate and regular rhythm. S1 and S2 normal without gallop, murmur, click, rub or other extra sounds. Abdomen:     obese,soft, non-tender, normal bowel sounds, no masses, and no guarding.   Rectal:     NE Genitalia:     NE Msk:     No deformity or scoliosis noted of thoracic or lumbar spine.   Pulses:     R and L carotid,radial,femoral,dorsalis pedis and posterior tibial pulses are full and equal bilaterally Extremities:     left pretibial edema and right pretibial edema.   Neurologic:     No cranial nerve deficits noted. Station and  gait are normal. Plantar reflexes are down-going bilaterally. DTRs are symmetrical throughout. Sensory, motor and coordinative functions appear intact.    Impression & Recommendations:  Problem # 1:  NUMBNESS (ICD-782.0) Assessment: Unchanged  Problem # 2:  UTI (ICD-599.0) Assessment: Deteriorated  Her updated medication list for this problem includes:    Phenazopyridine Hcl 200 Mg Tabs (Phenazopyridine hcl) .Marland Kitchen... Take 1 tablet by mouth twice a day    Vesicare 5 Mg Tabs (Solifenacin succinate) ..... Once daily    Levaquin 750 Mg Tabs (Levofloxacin) ..... Once daily    Pyridium 200 Mg Tabs (Phenazopyridine hcl) .Marland Kitchen... 1 three times a day    Flagyl 250 Mg Tabs (Metronidazole) .Marland Kitchen... 1 by mouth two times a day    Levaquin 500 Mg Tabs (Levofloxacin) .Marland Kitchen... 1 qd    Macrobid 100 Mg Caps (Nitrofurantoin monohyd macro) .Marland Kitchen... 1 two times a day for uti  Orders: T-Culture, Urine ([id number]) Specimen Handling (09811)   Problem # 3:  URINARY URGENCY (BJY-782.95) Assessment: Unchanged  Problem # 4:  BACK PAIN (ICD-724.5) Assessment: Unchanged  Her updated medication list for this problem includes:    Darvocet-n 100 100-650 Mg Tabs (Propoxyphene n-apap) .Marland Kitchen... As needed    Flexeril 10 Mg Tabs (Cyclobenzaprine hcl) .Marland KitchenMarland KitchenMarland KitchenMarland Kitchen 1  three times a day for muscle spasm   Problem # 5:  ANXIETY (ICD-300.00) Assessment: Improved  The following medications were removed from the medication list:    Alprazolam 0.5 Mg Tabs (Alprazolam) .Marland Kitchen... 1 tab three times a day for stress  Her updated medication list for this problem includes:    Amitriptyline Hcl 100 Mg Tabs (Amitriptyline hcl) .Marland Kitchen... Take 1 tablet by mouth at bedtime    Amitriptyline Hcl 100 Mg Tabs (Amitriptyline hcl) .Marland Kitchen... Take 1 at bedtime    Alprazolam 1 Mg Tb24 (Alprazolam) .Marland Kitchen... 1 hs    Hydroxyzine Hcl 25 Mg Tabs (Hydroxyzine hcl) .Marland Kitchen... 1 by mouth three times a day as needed itching    Alprazolam 1 Mg Tabs (Alprazolam) .Marland Kitchen... 1 three times a day  as needed stress   Problem # 6:  FATIGUE (ICD-780.79) Assessment: Unchanged  Complete Medication List: 1)  Amitriptyline Hcl 100 Mg Tabs (Amitriptyline hcl) .... Take 1 tablet by mouth at bedtime 2)  Fexofenadine Hcl 180 Mg Tabs (Fexofenadine hcl) .... Take 1 tablet by mouth once a day 3)  Flonase 50 Mcg/act Susp (Fluticasone propionate) .... Spray 1 spray into both nostrils once a day 4)  Phenazopyridine Hcl 200 Mg Tabs (Phenazopyridine hcl) .... Take 1 tablet by mouth twice a day 5)  Proferrin-forte 12-1 Mg Tabs (Fe heme polypeptide-folic acid) .... Take 6)  Bl Vitamin E 400 Unit Caps (Vitamin e) .... Once daily 7)  Advair Diskus 250-50 Mcg/dose Misc (Fluticasone-salmeterol) .... Uses as needed 8)  Astelin 137 Mcg/spray Soln (Azelastine hcl) .... At bedtime 9)  Neurontin 300 Mg Caps (Gabapentin) .... 0ne two times a day-tid 10)  Omeprazole 20 Mg Tbec (Omeprazole) .Marland Kitchen.. 1 by mouth once daily  needs to call for appt 11)  Valtrex 500 Mg Tabs (Valacyclovir hcl) .... Prn 12)  Amitiza 24 Mcg Caps (Lubiprostone) .... Once daily 13)  Vesicare 5 Mg Tabs (Solifenacin succinate) .... Once daily 14)  K-tabs 10 Meq Tbcr (Potassium chloride) .... Once daily 15)  Mag-ox 400 400 Mg Tabs (Magnesium oxide) .... Two once daily 16)  Darvocet-n 100 100-650 Mg Tabs (Propoxyphene n-apap) .... As needed 17)  Levaquin 750 Mg Tabs (Levofloxacin) .... Once daily 18)  Ambien 10 Mg Tabs (Zolpidem tartrate) .... On by mouth q hs prn 19)  Amitriptyline Hcl 100 Mg Tabs (Amitriptyline hcl) .... Take 1 at bedtime 20)  Potassium Chloride Crys Cr 20 Meq Tbcr (Potassium chloride crys cr) .Marland Kitchen.. 1 qd 21)  Alprazolam 1 Mg Tb24 (Alprazolam) .Marland Kitchen.. 1 hs 22)  Pyridium 200 Mg Tabs (Phenazopyridine hcl) .Marland Kitchen.. 1 three times a day 23)  Prilosec 40 Mg Cpdr (Omeprazole) .Marland Kitchen.. 1 once daily 24)  Neurontin 300 Mg Caps (Gabapentin) .Marland Kitchen.. 1 hs 25)  Ambien 10 Mg Tabs (Zolpidem tartrate) .Marland Kitchen.. 1 hs 26)  Maxzide-25 37.5-25 Mg Tabs  (Triamterene-hctz) .Marland Kitchen.. 1 once daily as needed for edema 27)  Hydroxyzine Hcl 25 Mg Tabs (Hydroxyzine hcl) .Marland Kitchen.. 1 by mouth three times a day as needed itching 28)  Hemocyte Plus 106-1 Mg Caps (B complex-c-min-fe-fa) .... Take 1 by mouth two times a day 29)  Flagyl 250 Mg Tabs (Metronidazole) .Marland Kitchen.. 1 by mouth two times a day 30)  Nystatin 100000 Unit/ml Susp (Nystatin) 31)  Transderm-scop 1.5 Mg Pt72 (Scopolamine base) 32)  Cyanocobalamin 1000 Mcg/ml Inj Soln (Cyanocobalamin) .... Inject 1 ml im once a week for 4 weeks then inject 1 ml once a month 33)  Levaquin 500 Mg Tabs (Levofloxacin) .Marland Kitchen.. 1 qd 34)  Xyzal 5 Mg Tabs (Levocetirizine dihydrochloride) 35)  Macrobid 100 Mg Caps (Nitrofurantoin monohyd macro) .Marland Kitchen.. 1 two times a day for uti 36)  Ambien 10 Mg Tabs (Zolpidem tartrate) .Marland Kitchen.. 1 hs for insomnia 37)  Flexeril 10 Mg Tabs (Cyclobenzaprine hcl) .Marland Kitchen.. 1 three times a day for muscle spasm 38)  Alprazolam 1 Mg Tabs (Alprazolam) .Marland Kitchen.. 1 three times a day as needed stress 39)  Optivar 0.05 % Soln (Azelastine hcl) .Marland Kitchen.. 1 drop  each  eye bid 40)  Lyrica 50 Mg Caps (Pregabalin) .Marland Kitchen.. 1 two times a day   Patient Instructions: 1)  Lyrica helps back, to take 50mg  two times a day 2)  urinalysis  positive for infection, to culture tx, then refer to urologist 3)  optivar for allergic conjunctivitis 4)  to refer to Dr. Barron Alvine as needed 5)  refilled medications   Prescriptions: OPTIVAR 0.05 % SOLN (AZELASTINE HCL) 1 drop  each  eye bid  #5.occ x 11   Entered and Authorized by:   Judithann Sheen MD   Signed by:   Judithann Sheen MD on 04/09/2008   Method used:   Electronically to        [facility] # [id number]* (retail)       [address]       [place], [place]  [address]       Ph: [phone] or [phone]       Fax: [phone]   RxID:   [id number] ALPRAZOLAM 1 MG TABS (ALPRAZOLAM) 1 three times a day as needed stress  #90 x 5   Entered  and Authorized by:   Judithann Sheen MD   Signed by:   Judithann Sheen MD on 04/09/2008   Method used:   Print then Give to Patient   RxID:   [id number] AMBIEN 10 MG  TABS (ZOLPIDEM TARTRATE) on by mouth q hs prn  #30 x 5   Entered and Authorized by:   Judithann Sheen MD   Signed by:   Judithann Sheen MD on 04/09/2008   Method used:   Print then Give to Patient   RxID:   [id number]   Laboratory Results   Urine Tests   Date/Time Reported: April 09, 2008 1:45 PM   Routine Urinalysis   Color: yellow Appearance: Cloudy Glucose: negative   (Normal Range: Negative) Bilirubin: negative   (Normal Range: Negative) Ketone: negative   (Normal Range: Negative) Spec. Gravity: 1.010   (Normal Range: 1.003-1.035) Blood: trace-lysed   (Normal Range: Negative) pH: 6.0   (Normal Range: 5.0-8.0) Protein: negative   (Normal Range: Negative) Urobilinogen: 0.2   (Normal Range: 0-1) Nitrite: positive   (Normal Range: Negative) Leukocyte Esterace: 3+   (Normal Range: Negative)    Comments: Wynona Canes, CMA  April 09, 2008 1:45 PM

## 2010-04-26 NOTE — Progress Notes (Signed)
Summary: PICC d/c  Phone Note Outgoing Call   Call placed by: Kathi Simpers, CMA Call placed to: [facility] Summary of Call: [facility] and gave verbal orders that PICC Line was being d/c on 08-02-09 and IV antibiotics no longer needed.  Initial call taken by: Kathi Simpers [facility]),  Aug 02, 2009 12:14 PM

## 2010-04-26 NOTE — Progress Notes (Signed)
Summary: Lab results  Phone Note Call from Patient Call back at Work Phone [phone]   Caller: Patient Call For: Dr. Juanda Chance Reason for Call: Lab or Test Results Summary of Call: Calling about Lab results Initial call taken by: Karna Christmas,  December 28, 2009 2:48 PM  Follow-up for Phone Call        Patient states that she was never advised of lab results (although our note indicates that she was told of results). She states "I need to know my lab values." Upon telling her Dr Regino Schultze recommendations, she states "just send me my lab values in the mail." Labs sent to patient's home address. Follow-up by: Lamona Curl CMA Duncan Dull),  December 28, 2009 3:56 PM

## 2010-04-26 NOTE — Progress Notes (Signed)
Summary: dr Scotty Court  Phone Note Call from Patient Call back at [phone]   Caller: Patient---live cal Summary of Call: Saw Dr Ninetta Lights on Tuesday, Infectious Control Dr at [facility]. They are going to put in a picc line tomorrow and vanconmycin for 6 weeks. She would like to speak with Dr Scotty Court. Has concerns and questions. Initial call taken by: Warnell Forester,  May 20, 2009 2:25 PM  Follow-up for Phone Call        called and discussed with patient

## 2010-04-26 NOTE — Progress Notes (Signed)
Summary: Urine culture  Phone Note Call from Patient   Caller: Patient Summary of Call: Pt would like to get her Urine Culture results. [phone] Initial call taken by: Lynann Beaver CMA,  April 13, 2008 2:49 PM  Follow-up for Phone Call        dr Scotty Court called pt last nite. to get cxr today and to make appt with dr grapey.   referral sent to [facility] .  Follow-up by: Pura Spice, RN,  April 14, 2008 8:47 AM

## 2010-04-26 NOTE — Progress Notes (Signed)
Summary: samples lyrica   Phone Note Call from Patient Call back at [phone]   Caller: Patient-live call Summary of Call: samples of lyrica 50mg .  cannot afford a rx. Initial call taken by: Warnell Forester,  June 25, 2008 1:45 PM  Follow-up for Phone Call        ok samples out front   Pt. notified. Lynann Beaver CMA  June 25, 2008 2:09 PM  Follow-up by: Pura Spice, RN,  June 25, 2008 2:00 PM

## 2010-04-26 NOTE — Progress Notes (Signed)
Summary: urology appt  Phone Note Outgoing Call   Call placed by: Chales Abrahams Summary of Call: Patient was here to see Dr Scotty Court and we relayed the urology appt information.   Initial call taken by: Felipa Evener,  April 16, 2008 6:21 PM

## 2010-04-26 NOTE — Assessment & Plan Note (Signed)
Summary: WI/UIV ANTIBIOCTIVS/JC   Referring Provider:  n/a Primary Provider:  London Sheer  CC:  follow-up visit.  History of Present Illness: 77 yo F with a hx of L2-L3 discectomy 1994. In 2009 she had "nerve injections" into her back due to back pain.  She has had worsening back pain recently (over last 3 and 1/2 weeks)  and underwent MRI (Apr 28, 2009) showing 1.  Significant marrow signal changes and abnormal enhancement about the L1-2 disc space, predominately in the inferior aspect of L1 with erosion into the inferior endplates.  This is most concerning for disc osteomyelitis. Reactive changes and a large Schmorl's node without infection are considered less likely. 2.  Abnormal soft tissue within the left lateral recess posterior to the L1 vertebral body.  This is most compatible with a small epidural abscess resulting in left lateral recess narrowing and potentially affecting the left L1 and L2 nerve roots.  Alternately, this could represent extruded disc material.  The latter explanation does not explain the peripheral enhancement. 3.  Abnormal enhancement of the paraspinal soft tissues about L1-2 also favors infection.  Had WBC of 7.8 (05-13-09). She underwent aspiration of L1-2 (2-17) no WBC, few SE, no organisms, Cx negative.  She was started on vancomycin and ceftriaxone via PIC. F/U WBC 06-22-2009 was 3.54.  She had f/u MRI (07-01-2009) 1.  Interval decrease in the extent of epidural enhancement posterior to L1.  A small nodular component is now present where the previous epidural abscess was suggested. 2.  Persistent endplate enhancement.  MRI findings can persist beyond the point where infection is clinically sterile. 3.  Decreased paravertebral and soft tissue enhancement at L1-2. 4.  Stable spondylosis of the lumbar spine at the L2-3 level and below. Cont to have some back pain, increased with walking alot. Has some nausea from medicine. No problems with PIC line  (occas infuses slowly). No pain, no erythema at site.   Preventive Screening-Counseling & Management  Alcohol-Tobacco     Alcohol drinks/day: 0     Smoking Status: never     Packs/Day: <0.25     Year Started: 1950     Year Quit: 1980  Caffeine-Diet-Exercise     Caffeine use/day: sodas     Does Patient Exercise: no  Safety-Violence-Falls     Seat Belt Use: yes   Updated Prior Medication List: FEXOFENADINE HCL 180 MG TABS (FEXOFENADINE HCL) Take 1 tablet by mouth once a day FLONASE 50 MCG/ACT SUSP (FLUTICASONE PROPIONATE) Spray 1 spray into both nostrils once a day BL VITAMIN E 400 UNIT  CAPS (VITAMIN E) once daily ADVAIR DISKUS 250-50 MCG/DOSE  MISC (FLUTICASONE-SALMETEROL) uses as needed OMEPRAZOLE 20 MG  TBEC (OMEPRAZOLE) 1 by mouth once daily VALTREX 500 MG  TABS (VALACYCLOVIR HCL) prn AMITIZA 24 MCG  CAPS (LUBIPROSTONE) once daily MAG-OX 400 400 MG  TABS (MAGNESIUM OXIDE) two once daily AMBIEN 10 MG  TABS (ZOLPIDEM TARTRATE) on by mouth q hs as needed AMITRIPTYLINE HCL 100 MG  TABS (AMITRIPTYLINE HCL) take 1 at bedtime POTASSIUM CHLORIDE CRYS CR 20 MEQ  TBCR (POTASSIUM CHLORIDE CRYS CR) 1/2 to 1 tab once daily HYDROXYZINE HCL 25 MG  TABS (HYDROXYZINE HCL) 1 by mouth three times a day as needed itching HEMOCYTE PLUS 106-1 MG  CAPS (B COMPLEX-C-MIN-FE-FA) take 1 by mouth two times a day CYANOCOBALAMIN 1000 MCG/ML INJ SOLN (CYANOCOBALAMIN) inject 1 ml IM once a week for 4 weeks then inject 1 ml once a month FLEXERIL 10 MG  TABS (CYCLOBENZAPRINE HCL) 1 three times a day for muscle spasm OPTIVAR 0.05 % SOLN (AZELASTINE HCL) 1 drop  each  eye bid LYRICA 50 MG CAPS (PREGABALIN) Take 1 tablet by mouth at bedtime BENTYL 20 MG TABS (DICYCLOMINE HCL) Take one by mouth 3 times daily as needed NABUMETONE 750 MG TABS (NABUMETONE) 1 by mouth two times a day VANCOMYCIN HCL 1000 MG SOLR (VANCOMYCIN HCL) dosed per home health pharmacy CEFTRIAXONE SODIUM 1 GM SOLR (CEFTRIAXONE SODIUM) 1g ivpb  once daily  Current Allergies (reviewed today): ! DEMEROL ! HYDROCODONE ! CIPROFLOXACIN HCL (CIPROFLOXACIN HCL) CODEINE PHOSPHATE (CODEINE PHOSPHATE) HYDROCODONE-HOMATROPINE (HYDROCODONE-HOMATROPINE) ZITHROMAX (AZITHROMYCIN) Past History:  Past medical, surgical, family and social histories (including risk factors) reviewed, and no changes noted (except as noted below).  Past Medical History: Asthma Obesity DIVERTICULOSIS, COLON (ICD-562.10) Hx of ESOPHAGEAL STRICTURE (ICD-530.3) VITAMIN B12 DEFICIENCY (ICD-266.2) COLONIC POLYPS, ADENOMATOUS, HX OF (ICD-V12.72) ANEMIA (ICD-285.9) ACUTE PHARYNGITIS (ICD-462) LARYNGITIS, ACUTE (ICD-464.00) NUMBNESS (ICD-782.0) UTI (ICD-599.0) ENTERITIS (ICD-558.9) URINARY URGENCY (ICD-788.63) BACK PAIN (ICD-724.5) ANXIETY (ICD-300.00) BARIATRIC SURGERY STATUS (ICD-V45.86) FATIGUE (ICD-780.79) INSOMNIA (ICD-780.52) ANEMIA, CHRONIC (ICD-281.9) CYSTITIS, ACUTE (ICD-595.0) BACK PAIN (ICD-724.5) GASTROINTESTINAL HEMORRHAGE, HX OF (ICD-V12.79) ASTHMA (ICD-493.90) ALLERGIC RHINITIS (ICD-477.9) 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   Past Surgical History: Reviewed history from 07/09/2008 and no changes required. Cholecystectomy Ileojejunal Bypass  Tonsillectomy Back surgery-ruptured disk  Family History: Reviewed history from 05/05/2009 and no changes required. No FH of Colon Cancer: Family History of Liver Disease/Cirrhosis:Sister Family History of Kidney Disease: Sister Cervical Cancer: Mother husband is PPD+.   Social History: Reviewed history from 08/04/2008 and no changes required. Illicit Drug Use - no Patient has never smoked.  Alcohol Use - no Daily Caffeine Use  diet coke  and mt dew some coffee Occupation: [employer]  Current Medications (verified): 1)  Fexofenadine Hcl 180 Mg Tabs (Fexofenadine Hcl) .... Take 1 Tablet By Mouth Once A Day 2)  Flonase 50 Mcg/act Susp  (Fluticasone Propionate) .... Spray 1 Spray Into Both Nostrils Once A Day 3)  Bl Vitamin E 400 Unit  Caps (Vitamin E) .... Once Daily 4)  Advair Diskus 250-50 Mcg/dose  Misc (Fluticasone-Salmeterol) .... Uses As Needed 5)  Omeprazole 20 Mg  Tbec (Omeprazole) .Marland Kitchen.. 1 By Mouth Once Daily 6)  Valtrex 500 Mg  Tabs (Valacyclovir Hcl) .... Prn 7)  Amitiza 24 Mcg  Caps (Lubiprostone) .... Once Daily 8)  Mag-Ox 400 400 Mg  Tabs (Magnesium Oxide) .... Two Once Daily 9)  Ambien 10 Mg  Tabs (Zolpidem Tartrate) .... On By Mouth Q Hs As Needed 10)  Amitriptyline Hcl 100 Mg  Tabs (Amitriptyline Hcl) .... Take 1 At Bedtime 11)  Potassium Chloride Crys Cr 20 Meq  Tbcr (Potassium Chloride Crys Cr) .... 1/2 To 1 Tab Once Daily 12)  Hydroxyzine Hcl 25 Mg  Tabs (Hydroxyzine Hcl) .Marland Kitchen.. 1 By Mouth Three Times A Day As Needed Itching 13)  Hemocyte Plus 106-1 Mg  Caps (B Complex-C-Min-Fe-Fa) .... Take 1 By Mouth Two Times A Day 14)  Cyanocobalamin 1000 Mcg/ml Inj Soln (Cyanocobalamin) .... Inject 1 Ml Im Once A Week For 4 Weeks Then Inject 1 Ml Once A Month 15)  Flexeril 10 Mg Tabs (Cyclobenzaprine Hcl) .Marland Kitchen.. 1 Three Times A Day For Muscle Spasm 16)  Optivar 0.05 % Soln (Azelastine Hcl) .Marland Kitchen.. 1 Drop  Each  Eye Bid 17)  Lyrica 50 Mg Caps (Pregabalin) .... Take 1 Tablet By Mouth At Bedtime  18)  Bentyl 20 Mg Tabs (Dicyclomine Hcl) .... Take One By Mouth 3 Times Daily As Needed 19)  Nabumetone 750 Mg Tabs (Nabumetone) .Marland Kitchen.. 1 By Mouth Two Times A Day 20)  Vancomycin Hcl 1000 Mg Solr (Vancomycin Hcl) .... Dosed Per Home Health Pharmacy 21)  Ceftriaxone Sodium 1 Gm Solr (Ceftriaxone Sodium) .Marland Kitchen.. 1g Ivpb Once Daily  Allergies: 1)  ! Demerol 2)  ! Hydrocodone 3)  ! Ciprofloxacin Hcl (Ciprofloxacin Hcl) 4)  Codeine Phosphate (Codeine Phosphate) 5)  Hydrocodone-Homatropine (Hydrocodone-Homatropine) 6)  Zithromax (Azithromycin)   Review of Systems       LE strength is good, fatigues with walking. nl BM (unchanged). no fvers  or chills, mild yeast infection.  Vital Signs:  Patient profile:   77 year old female Height:      65 inches (165.10 cm) Weight:      203.4 pounds (92.45 kg) BMI:     33.97 Temp:     98.2 degrees F (36.78 degrees C) oral Pulse rate:   108 / minute BP sitting:   138 / 84  (right arm)  Vitals Entered By: Baxter Hire) (July 02, 2009 10:51 AM) CC: follow-up visit Is Patient Diabetic? No Pain Assessment Patient in pain? no      Nutritional Status BMI of > 30 = obese Nutritional Status Detail appetite is pretty good per patient  Does patient need assistance? Functional Status Self care Ambulation Normal   Physical Exam  General:  overweight-appearing.   Eyes:  pupils equal, pupils round, and pupils reactive to light.   Mouth:  pharynx pink and moist and no exudates.   Neck:  no masses.   Lungs:  normal respiratory effort and normal breath sounds.   Heart:  normal rate, regular rhythm, and no murmur.   Abdomen:  soft, non-tender, and normal bowel sounds.   Extremities:  LUE PIC clean, no d/c, nontender Neurologic:  numbness in LLE, knee to toes.  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 have given her many options about her therapy- would like to continue her IV therapy for another month. will see her back after end of therapy. will cont to watch her WBC. will have f/u MRI sched for when she finnishes.  Her updated medication list for this problem includes:    Vancomycin Hcl 1000 Mg Solr (Vancomycin hcl) .Marland Kitchen... Dosed per home health pharmacy    Ceftriaxone Sodium 1 Gm Solr (Ceftriaxone sodium) .Marland Kitchen... 1g ivpb once daily  Orders: Est. Patient Level IV ([address]109) MRI with Contrast (MRI w/Contrast)  Medications Added to Medication List This Visit: 1)  Potassium Chloride Crys Cr 20 Meq Tbcr (Potassium chloride  crys cr) .... 1/2 to 1 tab once daily 2)  Lyrica 50 Mg Caps (Pregabalin) .... Take 1 tablet by mouth at bedtime 3)  Vancomycin Hcl 1000 Mg Solr (Vancomycin hcl) .... Dosed per home health pharmacy 4)  Ceftriaxone Sodium 1 Gm Solr (Ceftriaxone sodium) .Marland Kitchen.. 1g ivpb once daily

## 2010-04-26 NOTE — Letter (Signed)
Summary: [facility]  [facility]   Imported By: Maryln Gottron 01/20/2010 10:41:16  _____________________________________________________________________  External Attachment:    Type:   Image     Comment:   External Document

## 2010-04-26 NOTE — Progress Notes (Signed)
Summary: REFILL  Phone Note From Pharmacy Call back at [phone]   Caller: [facility] # [id number] Call For: [place]  Summary of Call: OMEPRAZOLE 20 MG 60 TAKE 1 CAPSULE BY MOUTH TWICE A DAY LAST FILL 11/27/06 Initial call taken by: Roselle Locus,  January 15, 2007 10:21 AM  Follow-up for Phone Call        Phone call completed, Pharmacist called Follow-up by: Alfred Levins, CMA,  January 15, 2007 10:54 AM    New/Updated Medications: OMEPRAZOLE 20 MG  TBEC (OMEPRAZOLE) 1 by mouth once daily   Prescriptions: OMEPRAZOLE 20 MG  TBEC (OMEPRAZOLE) 1 by mouth once daily  #30 x 11   Entered by:   Alfred Levins, CMA   Authorized by:   Judithann Sheen MD   Signed by:   Alfred Levins, CMA on 01/15/2007   Method used:   Electronically sent to ...       [facility] # [id number]       [address]       [place], [place]  [address]       Ph: [phone] or [phone]       Fax: [phone]   RxID:   [id number]

## 2010-04-26 NOTE — Letter (Signed)
Summary: [facility]  [facility]   Imported By: Maryln Gottron 09/16/2009 14:47:55  _____________________________________________________________________  External Attachment:    Type:   Image     Comment:   External Document

## 2010-04-26 NOTE — Progress Notes (Signed)
Summary: Patient calling about MRI  Phone Note Call from Patient   Caller: Patient Summary of Call: Patient called and wants a copy of the MRI report and your opinion about the report. Patient said that it could be mailed or would come by and pick it up. Please advise.  Initial call taken by: Kathi Simpers [facility]),  Aug 12, 2009 8:39 AM  Follow-up for Phone Call        can mail report. i explained to pt at visit that her MRI was much better.

## 2010-04-26 NOTE — Letter (Signed)
Summary: [facility]  [facility]   Imported By: Maryln Gottron 11/04/2008 10:08:40  _____________________________________________________________________  External Attachment:    Type:   Image     Comment:   External Document

## 2010-04-26 NOTE — Progress Notes (Signed)
Summary: needs to be sch   ---- Converted from flag ---- ---- 02/24/2009 9:03 AM, Lucy Antigua wrote: Patient called back and said that she gets labs done by Dr. Juanda Chance and she goes to her gynecologist regularly. Pt does not get physicals done and did not want to sch a cpx.   02/24/2009 8:39am - I lft message for pt to call me back.   ---- 02/24/2009 7:56 AM, Pura Spice, RN wrote: Anette Guarneri Clanton pls call her and ask her who is doing her complete physical with labs .thanks ------------------------------ pt notfied and per dr Scotty Court pt needs to be sch for cpx.

## 2010-04-26 NOTE — Letter (Signed)
Summary: Telephone Triage Note-Question about Lab Result  Telephone Triage Note-Question about Lab Result   Imported By: Maryln Gottron 09/16/2009 14:54:57  _____________________________________________________________________  External Attachment:    Type:   Image     Comment:   External Document

## 2010-04-26 NOTE — Miscellaneous (Signed)
Summary: Orders Update  Clinical Lists Changes  Orders: Added new Test order of T-Creatine [phone]) - Signed

## 2010-04-26 NOTE — Progress Notes (Signed)
Summary: UTI symptoms  Phone Note Call from Patient   Caller: Patient Call For: Judithann Sheen MD Summary of Call: Pt calling requesting results of labs.  Per Almira Coaster, Dr Scotty Court calling Cipro into [facility].  Pt requesting [facility] [address] Rx sent electronically, spoke with pt.  She cannot take Cipro.  Transferred call to [facility]. Initial call taken by: Sid Falcon LPN,  February 12, 2008 11:53 AM  Follow-up for Phone Call        spoke with pt can't tolerate cipro --[facility] called and rx cancelled and per dr Scotty Court call in levaquin 500mg  x 10 days to her drug store informmed pt after completion of med reck uarinalysis  Follow-up by: Pura Spice, RN,  February 12, 2008 12:13 PM   New Allergies: ! CIPROFLOXACIN HCL (CIPROFLOXACIN HCL) New/Updated Medications: LEVAQUIN 500 MG TABS (LEVOFLOXACIN) 1 qd CIPRO 500 MG TABS (CIPROFLOXACIN HCL) one tab two times a day New Allergies: ! CIPROFLOXACIN HCL (CIPROFLOXACIN HCL)  Prescriptions: LEVAQUIN 500 MG TABS (LEVOFLOXACIN) 1 qd  #10 x 0   Entered by:   Pura Spice, RN   Authorized by:   Judithann Sheen MD   Signed by:   Pura Spice, RN on 02/12/2008   Method used:   [facility]  [address] [id number]       [address]       [place], [place]  [address]       Ph: [phone] or [phone]       Fax: [phone]   RxID:   [id number] CIPRO 500 MG TABS (CIPROFLOXACIN HCL) one tab two times a day  #30 x 0   Entered by:   Sid Falcon LPN   Authorized by:   Judithann Sheen MD   Signed by:   Sid Falcon LPN on 56/43/3295   Method used:   [facility]        [facility]  [address] [id number]       [address]       [place], [place]  [address]       Ph: [phone] or [phone]       Fax: [phone]   RxID:   [id number]

## 2010-04-26 NOTE — Progress Notes (Signed)
Summary: call & lab results  Phone Note Call from Patient Call back at [phone] or [phone]   Caller: vm Call For: stafford Summary of Call: Dr. Scotty Court said he'd call me today.  Maybe he'll call after hours, but it's getting late & I want him to know I called & want lab results. ( Results not signed off.) Initial call taken by: Rudy Jew, RN,  November 21, 2007 4:52 PM  Follow-up for Phone Call        pt notified to increase hemocyte to two times a day and repeat hgb 2 months  pt stated will call later for that appt. Follow-up by: Pura Spice, RN,  November 22, 2007 12:29 PM

## 2010-04-26 NOTE — Letter (Signed)
Summary: [facility]  [facility]   Imported By: Maryln Gottron 04/28/2008 13:17:56  _____________________________________________________________________  External Attachment:    Type:   Image     Comment:   External Document

## 2010-04-26 NOTE — Procedures (Signed)
Summary: COLON   Colonoscopy  Procedure date:  03/29/2004  Findings:      Location:  [facility].    Procedures Next Due Date:    Colonoscopy: 03/2007  Patient Name: Alexis, Olson MRN:  Procedure Procedures: Colonoscopy CPT: 57846.    with polypectomy. CPT: A3573898.  Personnel: Endoscopist: Dora L. Juanda Chance, MD.  Referred By: Dianna Limbo, MD.  Exam Location: Exam performed in Outpatient Clinic. Outpatient  Patient Consent: Procedure, Alternatives, Risks and Benefits discussed, consent obtained, from patient. Consent was obtained by the RN.  Indications  Evaluation of: Positive fecal occult blood test per home screening. Iron Deficiency without Anemia  Symptoms: Diarrhea Patient is having increased frequency of stools.  History  Current Medications: Patient is not currently taking Coumadin.  Pre-Exam Physical: Performed Mar 29, 2004. Entire physical exam was normal.  Exam Exam: Extent of exam reached: Cecum, extent intended: Cecum.  The cecum was identified by appendiceal orifice and IC valve. Colon retroflexion performed. Images taken. ASA Classification: I. Tolerance: good.  Monitoring: Pulse and BP monitoring, Oximetry used. Supplemental O2 given.  Colon Prep Used Miralax for colon prep. Prep results: good.  Sedation Meds: Patient assessed and found to be appropriate for moderate (conscious) sedation. Fentanyl 50 mcg. given IV. Versed 5 mg. given IV.  Findings - POLYP: Cecum, Maximum size: 12 mm. sessile polyp. Distance from Anus 120 cm. Procedure:  snare with cautery/saline, The polyp was removed piece meal. removed, retrieved, Polyp sent to pathology. ICD9: Colon Polyps: 211.3. Comments: multilobulated, soft polyp, bled easily on contact.   Assessment Abnormal examination, see findings above.  Diagnoses: 211.3: Colon Polyps.   Comments: s/p cecal polypectomy Events  Unplanned Interventions: No intervention was required.  Unplanned  Events: There were no complications. Plans  Post Exam Instructions: No aspirin or non-steroidal containing medications: 2 weeks.  Medication Plan: Await pathology.  Patient Education: Patient given standard instructions for: Yearly hemoccult testing recommended. Patient instructed to get routine colonoscopy every 3 years.  Comments: gi blood loss possibly related to the presence of the cecal polyp, if her anemia congtinues, will proceed with small bowl capsule endoscopy Disposition: After procedure patient sent to recovery. After recovery patient sent home.   This report was created from the original endoscopy report, which was reviewed and signed by the above listed endoscopist.

## 2010-04-28 NOTE — Progress Notes (Signed)
Summary: Labs  Phone Note Call from Patient Call back at Home Phone [phone]   Call For: Dr Juanda Chance Summary of Call: Wants to know if her electrolytes can be checked at her upcoming labs. Would like a call back to make sure it will be done. Initial call taken by: Leanor Kail [facility],  March 07, 2010 2:21 PM  Follow-up for Phone Call        electrolyte panel added to labs to be completed on 03/24/10. Follow-up by: Lamona Curl CMA Duncan Dull),  March 07, 2010 2:25 PM

## 2010-05-16 ENCOUNTER — Telehealth: Payer: Self-pay | Admitting: Family Medicine

## 2010-05-16 NOTE — Telephone Encounter (Signed)
Call pt about Lyrica refill

## 2010-05-26 ENCOUNTER — Other Ambulatory Visit: Payer: Self-pay | Admitting: Family Medicine

## 2010-05-26 NOTE — Telephone Encounter (Signed)
Pt needs new rx ambien 10 mg and omeprazole 20 mg [facility] [address] [phone]

## 2010-05-31 ENCOUNTER — Telehealth: Payer: Self-pay | Admitting: Family Medicine

## 2010-06-01 ENCOUNTER — Telehealth: Payer: Self-pay | Admitting: Family Medicine

## 2010-06-01 NOTE — Telephone Encounter (Signed)
Pt would like a return call concerning her Remus Loffler Rx.... Pt adv that the Rx was needing a prior authorization... Pt can be reached at [phone]... She adv that paperwork has been faxed, waiting on Physicians office????

## 2010-06-08 NOTE — Telephone Encounter (Signed)
Prior auth for Zolpidem Tartrate has been approved from 05-17-2010 through 06-07-2011. [facility].

## 2010-06-14 ENCOUNTER — Ambulatory Visit: Payer: Self-pay

## 2010-06-14 DIAGNOSIS — D649 Anemia, unspecified: Secondary | ICD-10-CM

## 2010-06-14 LAB — ELECTROLYTE PANEL
CO2: 27 mEq/L (ref 19–32)
Chloride: 103 mEq/L (ref 96–112)
Potassium: 4 mEq/L (ref 3.5–5.1)
Sodium: 137 mEq/L (ref 135–145)

## 2010-06-14 LAB — CBC WITH DIFFERENTIAL/PLATELET
Basophils Absolute: 0 10*3/uL (ref 0.0–0.1)
Basophils Relative: 0.6 % (ref 0.0–3.0)
Eosinophils Absolute: 0.3 10*3/uL (ref 0.0–0.7)
Eosinophils Relative: 4.1 % (ref 0.0–5.0)
HCT: 33 % — ABNORMAL LOW (ref 36.0–46.0)
Hemoglobin: 11.3 g/dL — ABNORMAL LOW (ref 12.0–15.0)
Lymphocytes Relative: 36.1 % (ref 12.0–46.0)
Lymphs Abs: 2.4 10*3/uL (ref 0.7–4.0)
MCHC: 34.4 g/dL (ref 30.0–36.0)
MCV: 94.4 fl (ref 78.0–100.0)
Monocytes Absolute: 0.7 10*3/uL (ref 0.1–1.0)
Monocytes Relative: 9.9 % (ref 3.0–12.0)
Neutro Abs: 3.3 10*3/uL (ref 1.4–7.7)
Neutrophils Relative %: 49.3 % (ref 43.0–77.0)
Platelets: 257 10*3/uL (ref 150.0–400.0)
RBC: 3.49 Mil/uL — ABNORMAL LOW (ref 3.87–5.11)
RDW: 14 % (ref 11.5–14.6)
WBC: 6.6 10*3/uL (ref 4.5–10.5)

## 2010-06-14 LAB — IBC PANEL
Iron: 68 ug/dL (ref 42–145)
Saturation Ratios: 23.7 % (ref 20.0–50.0)
Transferrin: 205.3 mg/dL — ABNORMAL LOW (ref 212.0–360.0)

## 2010-06-14 LAB — VITAMIN B12: Vitamin B-12: 662 pg/mL (ref 211–911)

## 2010-06-15 LAB — CREATININE, SERUM
Creatinine, Ser: 0.65 mg/dL (ref 0.4–1.2)
GFR calc Af Amer: >60 mL/min (ref >60)
GFR calc non Af Amer: >60 mL/min (ref >60)

## 2010-06-16 ENCOUNTER — Telehealth: Payer: Self-pay | Admitting: *Deleted

## 2010-06-16 DIAGNOSIS — D649 Anemia, unspecified: Secondary | ICD-10-CM

## 2010-06-16 LAB — CULTURE, ROUTINE-ABSCESS
Culture: NO GROWTH
Gram Stain: NONE SEEN

## 2010-06-16 LAB — CBC
HCT: 33.3 % — ABNORMAL LOW (ref 36.0–46.0)
Hemoglobin: 11.4 g/dL — ABNORMAL LOW (ref 12.0–15.0)
MCHC: 34.3 g/dL (ref 30.0–36.0)
MCV: 96.7 fL (ref 78.0–100.0)
Platelets: 263 10*3/uL (ref 150–400)
RBC: 3.44 MIL/uL — ABNORMAL LOW (ref 3.87–5.11)
RDW: 14.1 % (ref 11.5–15.5)
WBC: 7.8 10*3/uL (ref 4.0–10.5)

## 2010-06-16 LAB — APTT: aPTT: 37 seconds (ref 24–37)

## 2010-06-16 LAB — PROTIME-INR
INR: 1.01 (ref 0.00–1.49)
Prothrombin Time: 13.2 seconds (ref 11.6–15.2)

## 2010-06-16 NOTE — Telephone Encounter (Signed)
Message copied by Jesse Fall on Thu Jun 16, 2010  4:20 PM ------      Message from: [name]      Created: Tue Jun 14, 2010  4:15 PM       Please call pt with stable CBC and normal  Iron levels. Continue Iron supplements. Repeat CBC July 2012

## 2010-06-16 NOTE — Telephone Encounter (Signed)
Spoke with patient and gave her lab results. Repeat labs in [facility] for 09/16/10.Jesse Fall, RN Level II

## 2010-06-21 ENCOUNTER — Encounter: Payer: Self-pay | Admitting: Family Medicine

## 2010-07-19 ENCOUNTER — Ambulatory Visit (INDEPENDENT_AMBULATORY_CARE_PROVIDER_SITE_OTHER): Payer: Medicare Other | Admitting: Family Medicine

## 2010-07-19 ENCOUNTER — Encounter: Payer: Self-pay | Admitting: Family Medicine

## 2010-07-19 VITALS — BP 122/70 | HR 101 | Temp 99.1°F

## 2010-07-19 DIAGNOSIS — N390 Urinary tract infection, site not specified: Secondary | ICD-10-CM

## 2010-07-19 DIAGNOSIS — E6609 Other obesity due to excess calories: Secondary | ICD-10-CM

## 2010-07-19 DIAGNOSIS — I10 Essential (primary) hypertension: Secondary | ICD-10-CM

## 2010-07-19 LAB — POCT URINALYSIS DIPSTICK
Bilirubin, UA: NEGATIVE
Glucose, UA: NEGATIVE
Ketones, UA: NEGATIVE
Nitrite, UA: POSITIVE
Protein, UA: NEGATIVE
Spec Grav, UA: 1.005
Urobilinogen, UA: 0.2
pH, UA: 5

## 2010-07-19 MED ORDER — PHENAZOPYRIDINE HCL 200 MG PO TABS: ORAL_TABLET | ORAL | Status: DC

## 2010-07-19 MED ORDER — LEVOFLOXACIN 500 MG PO TABS: 500.0000 mg | ORAL_TABLET | Freq: Every day | ORAL | Status: AC

## 2010-07-19 NOTE — Patient Instructions (Signed)
You have acute and chronic urinary infection  To tx with Levaquin for 14 days, also pyridium 3 times daily after meals for painful urination Stay well hydrated, good fluid intake

## 2010-07-21 ENCOUNTER — Telehealth: Payer: Self-pay | Admitting: *Deleted

## 2010-07-21 NOTE — Telephone Encounter (Signed)
Pt states the Levaquin is giving her joint pains, and wants to change as soon as possible.  [facility] [address]  Wants to know something tonight!!

## 2010-07-26 NOTE — Telephone Encounter (Signed)
Called pt.

## 2010-08-09 NOTE — Assessment & Plan Note (Signed)
OFFICE VISIT   SHERIDAN, HEW  DOB:  [birth year]                                       10/30/2006  HQION#:[id number]   I saw the patient in the office today for continued followup of her  chronic venous insufficiency.  I had last seen her in June of 2005.  At  that point she had a small wound on her right distal leg which was  slowly healing.  She ultimately went to the wound care center and was  treated from April until August of 2005, and ultimately the wound  healed.  She has also subsequently been seen at the [facility] where she apparently had laser ablation of the saphenous veins,  and also sclerotherapy, and they had done a nice job with her varicose  veins.  Her only complaint is when she is on her feet a lot she will  develop some aching in her legs, and she wears her compression stockings  at work and also when she is on her feet a lot.  She has had no more  wounds since the wound healed in August of 2005.  She wanted to come in  and have a routine followup visit.   PAST MEDICAL HISTORY:  Fairly unremarkable.  She denies any history of  diabetes, hypertension, hypercholesterolemia, history of previous  myocardial infarction, or history of congestive heart failure.   SOCIAL HISTORY:  She is married, she has 3 children.  She works as an  Charity fundraiser.  She does not use tobacco.   REVIEW OF SYSTEMS AND MEDICATIONS:  Documented on the medical history  form in her chart.   PHYSICAL EXAMINATION:  Blood pressure is 139/92, heart rate is 87.  I do  not detect any carotid bruits.  Lungs are clear bilaterally to  auscultation.  On cardiac exam she has a regular rate and rhythm.  She  has palpable femoral pulses bilaterally.  She has biphasic dorsalis  pedis and posterior tibial signals bilaterally.  She has no open ulcers  and, again, the [facility] have done a nice job with her  varicose veins.  I think she is very unlikely to have  problems with  venous ulcers in the future.   I will be happy to see her back at any time in the future if any new  problems arise.   Di Kindle. Edilia Bo, M.D.  Electronically Signed   CSD/MEDQ  D:  10/30/2006  T:  10/31/2006  Job:  [id number]   cc:   Ellin Saba., MD

## 2010-08-09 NOTE — Assessment & Plan Note (Signed)
[facility]                         GASTROENTEROLOGY OFFICE NOTE   NAME:SUITSLenyx, Boody                       MRN:          [id number]  DATE:05/22/2007                            DOB:          [birth year]    Ms. 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  She also has history of adenomatous polyp  of the colon in 2006.  She has history of benign distal esophageal  stricture dilated in January 2006.  Patient underwent laparoscopic  cholecystectomy in 1993.  We have found out she was B12 deficient in  December 2005 and she has been on B12 supplements monthly.  Small bowel  capsule endoscopy done in 2006 was incomplete since the capsule did not  move through the small bowel and stayed stationary for entire duration  of the exam.  Patient has been on chronic iron supplements and has had  good response to the oral iron.  She is here today because of  constipation.   MEDICATIONS:  1. Potassium supplement 20 mg daily.  2. Allergen.  3. Vitamin E.  4. Magnesium 400 mg b.i.d.  5. Prilosec 20 mg p.o. daily.  6. Advair and Astelin.  7. Flonase.  8. Amitriptyline 100 mg at night.  9. Ambien 1/2 tablet at night.  10.Xanax 0.5 mg at night.  11.Singulair.  12.Iron at night.  13.B12 monthly.  14.Neurontin 300 mg daily.   PHYSICAL EXAMINATION:  Blood pressure 106/62, pulse 70 and weight 210  pounds, which is 6 pound weight gain.  Patient was clearly overweight.  Alert, oriented, in no distress.  Sclerae nonicteric.  NECK:  Without lymphadenopathy.  Tongue was papillated.  LUNGS:  Clear to auscultation.  COR:  Normal S1, S2.  ABDOMEN:  Obese, soft with normoactive bowel sounds.  No abd normal  rashes.  No distension.  Bypass graft and post cholecystomy scars well  healed.  RECTAL:  Exam not done today.   IMPRESSION:   Seventy-77-year-old white female with change in the bowel  habits status post remote jejunal-ileo bypass.  I think her constipation  is not true constipation in terms of frequency or lack of it or  consistency.  She has a subjective feeling of not being empty because  she is used to having severe diarrhea secondary to the bypass.  She is  intolerant to having any bowel content, and has a need to toke a  laxative.  I reminded her of it and she understands.  I also feel her  change in the bowel habits may be attributed to decreased level of  physical activity since she had low back problems, and also decreased  appetite and eating habits.   PLAN:  1. Continue Amitiza, which seemed to work, 24 mcg 1 daily.  2. Samples of Align to take 1 a day as a probiotic that may decrease      some of the bloating.  3. I recommended using Activia yogurt as a probiotic.  4.  Her next colonoscopy ought to be in January 2011.     Hedwig Morton. Juanda Chance, MD  Electronically Signed    DMB/MedQ  DD: 05/22/2007  DT: 05/22/2007  Job #: [id number]   cc:   Ellin Saba., MD

## 2010-08-10 ENCOUNTER — Other Ambulatory Visit: Payer: Self-pay | Admitting: Internal Medicine

## 2010-08-12 NOTE — Procedures (Signed)
[facility]  Patient:    Alexis Olson, Alexis Olson                       MRN: [id number] Adm. Date:  [id number] Attending:  Mervin Hack CC:         Leroy Sea., M.D.  Alvino Chapel, M.D.   Procedure Report  PROCEDURE:  Colonoscopy.  ENDOSCOPIST:  Hedwig Morton. Juanda Chance, M.D.  INDICATIONS:  This 77 year old white female has a history of Hemoccult-positive stool.  She underwent colonoscopy in June 1992 by Dr. Ihor Austin. [place], with findings of essentially normal exam with a poor prep.  She had early diverticulosis of the left colon.  She has had chronic diarrhea after jejunoileal bypass for obesity.  Her hemoglobin recently was 10 g.  She denies any visible blood per rectum.  She is undergoing colonoscopy for evaluation of anemia.  ENDOSCOPE:  Olympus single-channel videoscope.  SEDATION:  Versed 7 mg IV, Fentanyl 100 mcg IV.  FINDINGS:  Olympus single-channel videoscope was passed directly into the rectum to the sigmoid colon.  Patient was monitored by a pulse oximeter; her oxygen saturations were normal.  Her prep was adequate.  Anal canal and rectal ampulla were unremarkable.  There were some minimal internal hemorrhoids. Retroflexion of endoscope in the rectum showed small internal hemorrhoids. There were numerous diverticula in the sigmoid colon; most of them were shallow.  The lumen itself did not appear to be narrowed due to diverticulosis.  Descending colon and splenic flexure were normal. Colonoscope passed easily through the transverse colon to the hepatic flexure, to the ascending colon and to the cecum.  Cecal pouch was viewed thoroughly and showed no AVMs or any vascular lesions that would explain Hemoccult-positive stool and anemia.  Ileocecal valve itself was normal. Terminal ileum was not entered.  Colonoscope was then retracted.  There were no diverticula in the right colon.  Multiple random biopsies were taken from the  left, the right and transverse colon to rule out microscopic or collagenous colitis to explain her diarrhea.  IMPRESSION: 1. Mild diverticulosis of the left colon. 2. Status post random biopsies of the left and right colon.  PLAN: 1. Hemoccult cards x 3. 2. To regularly check her liver function tests and blood counts to see whether    she has responded to oral iron.  If the Hemoccults are positive, we will    give consideration to upper endoscopy and possible small-bowel    follow-through. DD:  09/17/00 TD:  09/18/00 Job: [id number]

## 2010-08-13 ENCOUNTER — Encounter: Payer: Self-pay | Admitting: Family Medicine

## 2010-08-13 NOTE — Progress Notes (Signed)
  Subjective:    Patient ID: Alexis Olson, female    DOB: 12/15/1933, 76 y.o.   MRN: [id number]  HPI    Review of Systems     Objective:   Physical Exam        Assessment & Plan:  Chronic and acute urinary tract infection treated with Levaquin and peridium Hypertension controlled no change in treatment Continue other medications as prescribed

## 2010-08-13 NOTE — Progress Notes (Signed)
  Subjective:    Patient ID: Alexis Olson, female    DOB: [birth year], 77 y.o.   MRN: [id number]  HPI    Review of Systems     Objective:   Physical Exam        Assessment & Plan:

## 2010-08-13 NOTE — Progress Notes (Signed)
  Subjective:    Patient ID: Alexis Olson, female    DOB: [birth year], 77 y.o.   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 frequency dysuria and nocturia over the past 3 weeks late she has had a low-grade fever at times She desires to receive a vitamin B12  Relates she has had some nasal congestion secondary to nasal allergies has been taking Zyrtec hypertension and minimal controlled at 122/70 no other complaint HPI    Review of Systemssystems review negative otherwise see history of present illness     Objective:   Physical Exam Blood pressure 120/70 the patient is a well-developed well-nourished obese female who is alert oriented and cooperative Examination of the abdomen reveals no CVA tenderness however some tenderness in the suprapubic region bowel sounds are normal no masses palpable       Assessment & Plan:

## 2010-08-16 ENCOUNTER — Other Ambulatory Visit (INDEPENDENT_AMBULATORY_CARE_PROVIDER_SITE_OTHER): Payer: PRIVATE HEALTH INSURANCE | Admitting: Family Medicine

## 2010-08-16 DIAGNOSIS — N390 Urinary tract infection, site not specified: Secondary | ICD-10-CM

## 2010-08-16 LAB — POCT URINALYSIS DIPSTICK
Bilirubin, UA: NEGATIVE
Blood, UA: NEGATIVE
Glucose, UA: NEGATIVE
Ketones, UA: NEGATIVE
Leukocytes, UA: NEGATIVE
Nitrite, UA: NEGATIVE
Protein, UA: NEGATIVE
Spec Grav, UA: 1.005
Urobilinogen, UA: 0.2
pH, UA: 5.5

## 2010-08-17 ENCOUNTER — Other Ambulatory Visit: Payer: Self-pay

## 2010-08-17 MED ORDER — ZOLPIDEM TARTRATE 10 MG PO TABS: 10.0000 mg | ORAL_TABLET | Freq: Every evening | ORAL | Status: DC | PRN

## 2010-08-17 MED ORDER — ALPRAZOLAM 1 MG PO TABS: 1.0000 mg | ORAL_TABLET | Freq: Every evening | ORAL | Status: DC | PRN

## 2010-08-17 MED ORDER — AMITRIPTYLINE HCL 100 MG PO TABS: 100.0000 mg | ORAL_TABLET | Freq: Every day | ORAL | Status: DC

## 2010-08-23 ENCOUNTER — Other Ambulatory Visit: Payer: Self-pay | Admitting: Family Medicine

## 2010-08-25 ENCOUNTER — Telehealth: Payer: Self-pay

## 2010-08-25 NOTE — Telephone Encounter (Signed)
Pt is aware urinalysis is negative.

## 2010-09-12 ENCOUNTER — Telehealth: Payer: Self-pay | Admitting: *Deleted

## 2010-09-12 DIAGNOSIS — D509 Iron deficiency anemia, unspecified: Secondary | ICD-10-CM

## 2010-09-12 NOTE — Telephone Encounter (Signed)
Patient will come for repeat CBC on 09/26/10. She wants to know if Dr. Juanda Chance will check a CMET also. Please, advise.

## 2010-09-12 NOTE — Telephone Encounter (Signed)
Left a message for patient that labs are due on 09/16/10.

## 2010-09-12 NOTE — Telephone Encounter (Signed)
OK to add C-met with her labs

## 2010-09-12 NOTE — Telephone Encounter (Signed)
Message copied by Daphine Deutscher on Mon Sep 12, 2010  8:51 AM ------      Message from: Daphine Deutscher      Created: Thu Jun 16, 2010  4:25 PM       Due for CBC on 09/16/10 call and remind patient

## 2010-09-13 NOTE — Telephone Encounter (Signed)
CMET added to orders to be completed. Left message to advise patient of this as well.

## 2010-09-27 ENCOUNTER — Inpatient Hospital Stay (INDEPENDENT_AMBULATORY_CARE_PROVIDER_SITE_OTHER)
Admission: RE | Admit: 2010-09-27 | Discharge: 2010-09-27 | Disposition: A | Discharge status: Home / Self Care | Payer: Medicare Other | Source: Ambulatory Visit | Attending: Emergency Medicine | Admitting: Emergency Medicine

## 2010-09-27 ENCOUNTER — Telehealth: Payer: Self-pay | Admitting: *Deleted

## 2010-09-27 ENCOUNTER — Ambulatory Visit (INDEPENDENT_AMBULATORY_CARE_PROVIDER_SITE_OTHER): Payer: Medicare Other

## 2010-09-27 ENCOUNTER — Other Ambulatory Visit (INDEPENDENT_AMBULATORY_CARE_PROVIDER_SITE_OTHER): Payer: Medicare Other

## 2010-09-27 DIAGNOSIS — D509 Iron deficiency anemia, unspecified: Secondary | ICD-10-CM

## 2010-09-27 DIAGNOSIS — M75100 Unspecified rotator cuff tear or rupture of unspecified shoulder, not specified as traumatic: Secondary | ICD-10-CM

## 2010-09-27 DIAGNOSIS — IMO0002 Reserved for concepts with insufficient information to code with codable children: Secondary | ICD-10-CM

## 2010-09-27 DIAGNOSIS — D649 Anemia, unspecified: Secondary | ICD-10-CM

## 2010-09-27 LAB — COMPREHENSIVE METABOLIC PANEL
ALT: 18 U/L (ref 0–35)
AST: 18 U/L (ref 0–37)
Albumin: 4.2 g/dL (ref 3.5–5.2)
Alkaline Phosphatase: 42 U/L (ref 39–117)
BUN: 14 mg/dL (ref 6–23)
CO2: 26 mEq/L (ref 19–32)
Calcium: 8.9 mg/dL (ref 8.4–10.5)
Chloride: 103 mEq/L (ref 96–112)
Creatinine, Ser: 0.5 mg/dL (ref 0.4–1.2)
GFR: 130.71 mL/min (ref >60.00)
Glucose, Bld: 96 mg/dL (ref 70–99)
Potassium: 4 mEq/L (ref 3.5–5.1)
Sodium: 138 mEq/L (ref 135–145)
Total Bilirubin: 0.3 mg/dL (ref 0.3–1.2)
Total Protein: 7 g/dL (ref 6.0–8.3)

## 2010-09-27 LAB — CBC WITH DIFFERENTIAL/PLATELET
Basophils Absolute: 0 10*3/uL (ref 0.0–0.1)
Basophils Relative: 0.3 % (ref 0.0–3.0)
Eosinophils Absolute: 0.3 10*3/uL (ref 0.0–0.7)
Eosinophils Relative: 3.6 % (ref 0.0–5.0)
HCT: 34.3 % — ABNORMAL LOW (ref 36.0–46.0)
Hemoglobin: 11.8 g/dL — ABNORMAL LOW (ref 12.0–15.0)
Lymphocytes Relative: 29.5 % (ref 12.0–46.0)
Lymphs Abs: 2.1 10*3/uL (ref 0.7–4.0)
MCHC: 34.5 g/dL (ref 30.0–36.0)
MCV: 97.8 fl (ref 78.0–100.0)
Monocytes Absolute: 0.7 10*3/uL (ref 0.1–1.0)
Monocytes Relative: 9.1 % (ref 3.0–12.0)
Neutro Abs: 4.1 10*3/uL (ref 1.4–7.7)
Neutrophils Relative %: 57.5 % (ref 43.0–77.0)
Platelets: 252 10*3/uL (ref 150.0–400.0)
RBC: 3.51 Mil/uL — ABNORMAL LOW (ref 3.87–5.11)
RDW: 13.2 % (ref 11.5–14.6)
WBC: 7.2 10*3/uL (ref 4.5–10.5)

## 2010-09-27 NOTE — Telephone Encounter (Signed)
Message copied by Daphine Deutscher on Tue Sep 27, 2010  9:06 AM ------      Message from: Daphine Deutscher      Created: Mon Sep 12, 2010  8:52 AM       Did patient get CBC on 09/16/10(DB)?

## 2010-09-27 NOTE — Telephone Encounter (Signed)
Called patient and she will come today for labs.

## 2010-09-29 ENCOUNTER — Telehealth: Payer: Self-pay | Admitting: *Deleted

## 2010-09-29 DIAGNOSIS — D649 Anemia, unspecified: Secondary | ICD-10-CM

## 2010-09-29 NOTE — Telephone Encounter (Signed)
Message copied by Daphine Deutscher on Thu Sep 29, 2010  9:54 AM ------      Message from: Hart Carwin      Created: Tue Sep 27, 2010  5:38 PM       Please call pt with stable  H/H, repeat in  October 2012

## 2010-09-30 NOTE — Telephone Encounter (Signed)
Left a message for patient with results. Labs in [facility] for 12/26/10. Note to remind patient.

## 2010-10-07 ENCOUNTER — Telehealth: Payer: Self-pay | Admitting: Internal Medicine

## 2010-10-07 DIAGNOSIS — Z9884 Bariatric surgery status: Secondary | ICD-10-CM

## 2010-10-07 NOTE — Telephone Encounter (Signed)
Patient asking if she can have magnesium and triglycerides checked with her next labs in October. Please, advise

## 2010-10-08 NOTE — Telephone Encounter (Signed)
Pt may have her Mg2+ and triglycerides checked . She wil lhave to be NPO after midnight. Does she want the lipid panel or just triglycerides?

## 2010-10-10 NOTE — Telephone Encounter (Signed)
Spoke with patient and she wants to have the lipid panel Labs in [facility] for Oct. As per patient request

## 2010-10-18 ENCOUNTER — Other Ambulatory Visit: Payer: Self-pay

## 2010-10-18 MED ORDER — MAGIC MOUTHWASH: ORAL | Status: DC

## 2010-10-18 NOTE — Telephone Encounter (Signed)
rx sent to pharmacy

## 2010-10-19 ENCOUNTER — Other Ambulatory Visit: Payer: Self-pay

## 2010-10-19 MED ORDER — FIRST-DUKES MOUTHWASH MT SUSP: OROMUCOSAL | Status: DC

## 2010-10-19 NOTE — Telephone Encounter (Signed)
rx sent to pharmacy

## 2010-10-21 ENCOUNTER — Telehealth: Payer: Self-pay | Admitting: Family Medicine

## 2010-10-21 MED ORDER — PREGABALIN 50 MG PO CAPS: 50.0000 mg | ORAL_CAPSULE | Freq: Two times a day (BID) | ORAL | Status: DC

## 2010-10-21 NOTE — Telephone Encounter (Signed)
Pt requesting refill on lyrica

## 2010-11-16 ENCOUNTER — Telehealth: Payer: Self-pay

## 2010-11-16 ENCOUNTER — Other Ambulatory Visit: Payer: Self-pay | Admitting: Internal Medicine

## 2010-11-16 NOTE — Telephone Encounter (Signed)
[facility] requesting the decrease in mg for pt's amitryptiline from 100mg  to 75mg  because that is the recommended dose for pt's age. pls advise

## 2010-11-16 NOTE — Telephone Encounter (Signed)
Per Dr. Scotty Court pt is to stay on 100mg .

## 2010-11-16 NOTE — Telephone Encounter (Signed)
[facility] pharmacy aware

## 2010-11-24 ENCOUNTER — Other Ambulatory Visit: Payer: Self-pay | Admitting: Internal Medicine

## 2010-11-24 NOTE — Telephone Encounter (Signed)
Dr Juanda Chance- Patient last got b12 rx from Dr Scotty Court in 10/30/07. She states that she is unable to get refills from him because "he is only in 2 days per week." I have explained that his assistant should be taking care of medications even when he is not in the office. She states that she has not seen him in a while. I asked if she has been taking b12 monthly because the last date I see that she was given b12 was 2009. She states that she does take it every 3 or 4 weeks but just now realized it was from 2009. I explained that her b12 was normal in March (at 667). Dr Juanda Chance, do you want me to give patient b12 or do you want Dr Scotty Court to give rx (she apparently had gastric bypass so she will always need b12) ?

## 2010-11-27 NOTE — Telephone Encounter (Signed)
We will be refilling it since it is a case of B12 maalabsorbtion and chronic GI blood loss.

## 2010-11-29 MED ORDER — CYANOCOBALAMIN 1000 MCG/ML IJ SOLN: 1000.0000 ug | INTRAMUSCULAR | Status: DC

## 2010-11-29 NOTE — Telephone Encounter (Signed)
Left message for patient to call back. Per Dr Regino Schultze verbal orders, she would like patient to have a follow up visit since we have not seen her in the office since 07/2008.

## 2010-11-29 NOTE — Telephone Encounter (Signed)
I have spoken to patient. She is scheduled for office visit on 01/16/11 for routine follow up. We have sent in 2 ml of B12 injectable until her office visit. Patient verbalizes understanding.

## 2010-12-23 ENCOUNTER — Telehealth: Payer: Self-pay | Admitting: *Deleted

## 2010-12-23 NOTE — Telephone Encounter (Signed)
Message copied by Daphine Deutscher on Fri Dec 23, 2010  8:30 AM ------      Message from: Daphine Deutscher      Created: Fri Sep 30, 2010  9:52 AM       Call and remind patient to come for CBCon 12/26/10(DB)

## 2010-12-23 NOTE — Telephone Encounter (Signed)
Left a message on voice mail reminding patient of repeat labs due.

## 2010-12-26 ENCOUNTER — Telehealth: Payer: Self-pay | Admitting: Internal Medicine

## 2010-12-26 NOTE — Telephone Encounter (Signed)
Patient wanted to be sure she had a lipid panel in her lab work. She will come tomorrow for this and her other labs

## 2010-12-27 ENCOUNTER — Other Ambulatory Visit (INDEPENDENT_AMBULATORY_CARE_PROVIDER_SITE_OTHER): Payer: Medicare Other

## 2010-12-27 DIAGNOSIS — Z79899 Other long term (current) drug therapy: Secondary | ICD-10-CM

## 2010-12-27 DIAGNOSIS — D649 Anemia, unspecified: Secondary | ICD-10-CM

## 2010-12-27 DIAGNOSIS — Z9884 Bariatric surgery status: Secondary | ICD-10-CM

## 2010-12-27 LAB — CBC WITH DIFFERENTIAL/PLATELET
Basophils Absolute: 0 10*3/uL (ref 0.0–0.1)
Basophils Relative: 0.3 % (ref 0.0–3.0)
Eosinophils Absolute: 0.3 10*3/uL (ref 0.0–0.7)
Eosinophils Relative: 4.1 % (ref 0.0–5.0)
HCT: 36.8 % (ref 36.0–46.0)
Hemoglobin: 12.1 g/dL (ref 12.0–15.0)
Lymphocytes Relative: 29.2 % (ref 12.0–46.0)
Lymphs Abs: 2 10*3/uL (ref 0.7–4.0)
MCHC: 33 g/dL (ref 30.0–36.0)
MCV: 97.2 fl (ref 78.0–100.0)
Monocytes Absolute: 0.6 10*3/uL (ref 0.1–1.0)
Monocytes Relative: 9.4 % (ref 3.0–12.0)
Neutro Abs: 3.9 10*3/uL (ref 1.4–7.7)
Neutrophils Relative %: 57 % (ref 43.0–77.0)
Platelets: 284 10*3/uL (ref 150.0–400.0)
RBC: 3.78 Mil/uL — ABNORMAL LOW (ref 3.87–5.11)
RDW: 14 % (ref 11.5–14.6)
WBC: 6.8 10*3/uL (ref 4.5–10.5)

## 2010-12-27 LAB — LIPID PANEL
Cholesterol: 185 mg/dL (ref 0–200)
HDL: 53.6 mg/dL (ref >39.00)
LDL Cholesterol: 92 mg/dL (ref 0–99)
Total CHOL/HDL Ratio: 3
Triglycerides: 197 mg/dL — ABNORMAL HIGH (ref 0.0–149.0)
VLDL: 39.4 mg/dL (ref 0.0–40.0)

## 2010-12-27 LAB — MAGNESIUM: Magnesium: 2.1 mg/dL (ref 1.5–2.5)

## 2010-12-28 ENCOUNTER — Telehealth: Payer: Self-pay | Admitting: *Deleted

## 2010-12-28 NOTE — Telephone Encounter (Signed)
Patient given results and copy mailed to patient per her request.

## 2010-12-28 NOTE — Telephone Encounter (Signed)
Message copied by Daphine Deutscher on Wed Dec 28, 2010  8:50 AM ------      Message from: Hart Carwin      Created: Tue Dec 27, 2010 11:21 PM       Please call pt with normal CBC, Mg2+, and lipid profile , except for mild elevation of triglycerides

## 2010-12-29 ENCOUNTER — Telehealth: Payer: Self-pay | Admitting: Family Medicine

## 2010-12-29 NOTE — Telephone Encounter (Signed)
Pt needs refill lyrica 50mg  #60 call into [facility] [address] [phone]

## 2010-12-30 ENCOUNTER — Other Ambulatory Visit: Payer: Self-pay

## 2010-12-30 MED ORDER — PREGABALIN 50 MG PO CAPS: 50.0000 mg | ORAL_CAPSULE | Freq: Two times a day (BID) | ORAL | Status: DC

## 2010-12-30 NOTE — Telephone Encounter (Signed)
rx sent in to pharmacy.  Pt aware.

## 2011-01-02 NOTE — Telephone Encounter (Signed)
Done pt aware °

## 2011-01-16 ENCOUNTER — Encounter: Payer: Self-pay | Admitting: Internal Medicine

## 2011-01-16 ENCOUNTER — Ambulatory Visit (INDEPENDENT_AMBULATORY_CARE_PROVIDER_SITE_OTHER): Payer: Medicare Other | Admitting: Internal Medicine

## 2011-01-16 DIAGNOSIS — D509 Iron deficiency anemia, unspecified: Secondary | ICD-10-CM

## 2011-01-16 DIAGNOSIS — R195 Other fecal abnormalities: Secondary | ICD-10-CM

## 2011-01-16 MED ORDER — DIPHENOXYLATE-ATROPINE 2.5-0.025 MG PO TABS: 1.0000 | ORAL_TABLET | Freq: Every day | ORAL | Status: DC | PRN

## 2011-01-16 MED ORDER — CYANOCOBALAMIN 1000 MCG/ML IJ SOLN: 1000.0000 ug | INTRAMUSCULAR | Status: DC

## 2011-01-16 NOTE — Patient Instructions (Signed)
You have been scheduled for an abdominal ultrasound at [facility] (1st floor of hospital) on 01/25/11 at 9:30 am. Please arrive 15 minutes prior to your appointment for registration. Make certain not to have anything to eat or drink 6 hours prior to your appointment. Should you need to reschedule your appointment, please contact radiology at [phone]. We have sent the following medications to your pharmacy for you to pick up at your convenience: B12 refills Lomotil Refills CC:Dr Scotty Court

## 2011-01-16 NOTE — Progress Notes (Signed)
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 She also has chronic GI blood loss likely from the anastomosis. She also has positive IBD markers. She has chronic diarrhea, controlled with Imodium and Lomotil. She also has a history of a tubulovillous adenoma of the colon in 2006. She had her last colonoscopy in June 2010 which was negative. Her last upper endoscopy with enteroscopy in June 2010 was normal. Small bowel biopsies did not show any evidence of celiac disease. A small bowel capsule endoscopy in June 2008 showed a poor prep. The capsule did not reach the distal colon. Her last hemoglobin was 11.3 and hematocrit was 33. He iron saturation was 17%. She has no specific symptoms today.   Past Medical History  Diagnosis Date  . Asthma   . Anemia   . Allergy   . Obesity   . Diverticulosis of colon   . Anxiety   . Insomnia   . Esophageal stricture   . Vitamin B12 deficiency   . History of adenomatous polyp of colon   . Enteritis   . History of gastrointestinal hemorrhage   . Lumbosacral disc disease    Past Surgical History  Procedure Date  . Cholecystectomy   . Tonsillectomy   . Ileojejunal bypass   . Back surgery 1994    for ruptured disc    reports that she has never smoked. She has never used smokeless tobacco. She reports that she does not drink alcohol or use illicit drugs. family history includes Cervical cancer in her mother; Kidney disease in her sister; and Liver disease in her sister.  There is no history of Colon cancer. Allergies  Allergen Reactions  . Azithromycin     REACTION: unspecified  . Ciprofloxacin Hcl     REACTION: causes yeast inf  and refuses to take  . Codeine Phosphate     REACTION: unspecified  . Hydrocodone   . Hydrocodone-Homatropine     REACTION: unspecified  . Meperidine Hcl          Review of Systems: Denies symptoms of reflux, dysphagia or shortness of breath mildly overweight  The remainder of the 10 point ROS is negative except as outlined in H&P   Physical Exam: General appearance  Well developed, in no distress. Eyes- non icteric. HEENT nontraumatic, normocephalic. Mouth no lesions, tongue papillated, no cheilosis. Neck supple without adenopathy, thyroid not enlarged, no carotid bruits, no JVD. Lungs Clear to auscultation bilaterally. Cor normal S1, normal S2, regular rhythm, no murmur,  quiet precordium. Abdomen: Soft without tenderness. Liver edge at costal margin. No ascites. Rectal exam reveals Hemoccult positive stool. Skin no lesions. Early Dupuytren contracture left hand Neurological alert and oriented x 3. Psychological normal mood and affect.  Assessment and Plan:  Problem #1 Chronic GI blood loss from a ileojejunal bypass combined with iron malabsorption. Her hemoglobin has been stable around 11-12 g. We check it every 3 months. She is on B12 and iron supplements. She has hemoccult-positive stool, likely from the anastomosis.  Problem #2 Chronic diarrhea. We will refill her Lomotil that she may take as needed.   01/16/2011 [name]

## 2011-01-24 ENCOUNTER — Other Ambulatory Visit (HOSPITAL_COMMUNITY): Payer: Medicare Other

## 2011-01-24 ENCOUNTER — Telehealth: Payer: Self-pay | Admitting: *Deleted

## 2011-01-24 ENCOUNTER — Ambulatory Visit (HOSPITAL_COMMUNITY)
Admission: RE | Admit: 2011-01-24 | Discharge: 2011-01-24 | Disposition: A | Discharge status: Home / Self Care | Payer: Medicare Other | Source: Ambulatory Visit | Attending: Internal Medicine | Admitting: Internal Medicine

## 2011-01-24 DIAGNOSIS — I10 Essential (primary) hypertension: Secondary | ICD-10-CM | POA: Insufficient documentation

## 2011-01-24 DIAGNOSIS — D509 Iron deficiency anemia, unspecified: Secondary | ICD-10-CM

## 2011-01-24 DIAGNOSIS — D649 Anemia, unspecified: Secondary | ICD-10-CM | POA: Insufficient documentation

## 2011-01-24 DIAGNOSIS — R195 Other fecal abnormalities: Secondary | ICD-10-CM

## 2011-01-24 DIAGNOSIS — Z9089 Acquired absence of other organs: Secondary | ICD-10-CM | POA: Insufficient documentation

## 2011-01-24 DIAGNOSIS — R109 Unspecified abdominal pain: Secondary | ICD-10-CM | POA: Insufficient documentation

## 2011-01-24 NOTE — Telephone Encounter (Signed)
Message copied by Daphine Deutscher on Tue Jan 24, 2011  4:21 PM ------      Message from: Hart Carwin      Created: Tue Jan 24, 2011  4:07 PM       Please call pt with normal ultrasound of the liver.

## 2011-01-24 NOTE — Telephone Encounter (Signed)
Left message for patient to call back  

## 2011-01-25 ENCOUNTER — Other Ambulatory Visit: Payer: Self-pay | Admitting: Internal Medicine

## 2011-01-25 ENCOUNTER — Other Ambulatory Visit (HOSPITAL_COMMUNITY): Payer: Medicare Other

## 2011-01-25 NOTE — Telephone Encounter (Signed)
Per Dr Juanda Chance, patient may have prescription of Bentyl.

## 2011-01-25 NOTE — Telephone Encounter (Signed)
Patient notified of results as per Dr. Brodie. 

## 2011-02-03 ENCOUNTER — Other Ambulatory Visit: Payer: Self-pay | Admitting: Family Medicine

## 2011-02-06 DIAGNOSIS — H47299 Other optic atrophy, unspecified eye: Secondary | ICD-10-CM | POA: Insufficient documentation

## 2011-02-10 ENCOUNTER — Other Ambulatory Visit: Payer: Self-pay | Admitting: Family Medicine

## 2011-02-12 ENCOUNTER — Emergency Department (INDEPENDENT_AMBULATORY_CARE_PROVIDER_SITE_OTHER)
Admission: EM | Admit: 2011-02-12 | Discharge: 2011-02-12 | Disposition: A | Discharge status: Home / Self Care | Payer: Medicare Other | Source: Home / Self Care | Attending: Family Medicine | Admitting: Family Medicine

## 2011-02-12 ENCOUNTER — Encounter (HOSPITAL_COMMUNITY): Payer: Self-pay | Admitting: Family Medicine

## 2011-02-12 DIAGNOSIS — N390 Urinary tract infection, site not specified: Secondary | ICD-10-CM

## 2011-02-12 LAB — POCT URINALYSIS DIP (DEVICE)
Bilirubin Urine: NEGATIVE
Glucose, UA: NEGATIVE mg/dL
Ketones, ur: NEGATIVE mg/dL
Nitrite: NEGATIVE
Protein, ur: NEGATIVE mg/dL
Specific Gravity, Urine: <=1.005 (ref 1.005–1.030)
Urobilinogen, UA: 0.2 mg/dL (ref 0.0–1.0)
pH: 5.5 (ref 5.0–8.0)

## 2011-02-12 MED ORDER — SULFAMETHOXAZOLE-TRIMETHOPRIM 800-160 MG PO TABS: 1.0000 | ORAL_TABLET | Freq: Two times a day (BID) | ORAL | Status: AC

## 2011-02-12 NOTE — ED Provider Notes (Signed)
History     CSN: [id number] Arrival date & time: 02/12/2011  6:02 PM   First MD Initiated Contact with Patient 02/12/11 1736      Chief Complaint  Patient presents with  . Urinary Frequency    pt with c/o painful urination/frequency onset x 3 days frequent UTI;s    (Consider location/radiation/quality/duration/timing/severity/associated sxs/prior treatment) Patient is a 77 y.o. female presenting with dysuria. The history is provided by the patient.  Dysuria  This is a recurrent problem. The current episode started more than 2 days ago. The problem occurs every urination. The problem has been gradually worsening. The quality of the pain is described as burning. The pain is mild. Pertinent negatives include no nausea, no vomiting and no discharge. She has tried nothing for the symptoms. Her past medical history is significant for recurrent UTIs.    Past Medical History  Diagnosis Date  . Asthma   . Anemia   . Allergy   . Obesity   . Diverticulosis of colon   . Anxiety   . Insomnia   . Esophageal stricture   . Vitamin B12 deficiency   . History of adenomatous polyp of colon   . Enteritis   . History of gastrointestinal hemorrhage   . Lumbosacral disc disease     Past Surgical History  Procedure Date  . Cholecystectomy   . Tonsillectomy   . Ileojejunal bypass   . Back surgery 1994    for ruptured disc    Family History  Problem Relation Age of Onset  . Cervical cancer Mother   . Liver disease Sister   . Kidney disease Sister   . Colon cancer Neg Hx     History  Substance Use Topics  . Smoking status: Never Smoker   . Smokeless tobacco: Never Used  . Alcohol Use: No    OB History    Grav Para Term Preterm Abortions TAB SAB Ect Mult Living                  Review of Systems  Constitutional: Negative.   HENT: Negative.   Respiratory: Negative.   Cardiovascular: Negative.   Gastrointestinal: Negative.  Negative for nausea and vomiting.  Genitourinary:  Positive for dysuria.  Musculoskeletal: Negative.   Skin: Negative.     Allergies  Azithromycin; Ciprofloxacin hcl; Codeine phosphate; Hydrocodone; Hydrocodone-homatropine; Meperidine hcl; and Ultram  Home Medications   Current Outpatient Rx  Name Route Sig Dispense Refill  . ALPRAZOLAM 1 MG PO TABS Oral Take 1 tablet (1 mg total) by mouth at bedtime as needed. 90 tablet 3  . AMITRIPTYLINE HCL 100 MG PO TABS Oral Take 1 tablet (100 mg total) by mouth at bedtime. 90 tablet 3  . AZELASTINE HCL 0.05 % OP SOLN Both Eyes Place 1 drop into both eyes 2 (two) times daily.      Marland Kitchen HEMOCYTE PLUS 106-1 MG PO CAPS  take 1 capsule by mouth twice a day 60 each 1    Need office visit for further refills  . CLOTRIMAZOLE 1 % EX CREA Topical Apply 1 application topically 2 (two) times daily.      . CYANOCOBALAMIN 1000 MCG/ML IJ SOLN Intramuscular Inject 1 mL (1,000 mcg total) into the muscle every 30 (thirty) days. 6 mL 0    Pharmacy- please include 3 ml syringe and 1 inch 2 ...  . CYCLOBENZAPRINE HCL 10 MG PO TABS Oral Take 10 mg by mouth 3 (three) times daily as needed.      Marland Kitchen  DICYCLOMINE HCL 10 MG PO CAPS  take 1 capsule by mouth three times a day 90 capsule 2  . DICYCLOMINE HCL 20 MG PO TABS Oral Take 20 mg by mouth every 6 (six) hours.      Marland Kitchen FIRST-DUKES MOUTHWASH MT SUSP  Take 1 to 2 teaspoonfuls every 4 to 6 hours as directed. 120 mL 2  . DIPHENOXYLATE-ATROPINE 2.5-0.025 MG PO TABS Oral Take 1 tablet by mouth daily as needed for diarrhea/loose stools. 60 tablet 1  . FEXOFENADINE HCL 180 MG PO TABS Oral Take 180 mg by mouth daily.      Marland Kitchen FLUTICASONE PROPIONATE 50 MCG/ACT NA SUSP Nasal 2 sprays by Nasal route daily.      Marland Kitchen FLUTICASONE-SALMETEROL 250-50 MCG/DOSE IN AEPB Inhalation Inhale 1 puff into the lungs every 12 (twelve) hours.     . GUAIFENESIN 600 MG PO TB12 Oral Take 600 mg by mouth 2 (two) times daily.     Marland Kitchen HYDROXYZINE HCL 25 MG PO TABS Oral Take 25 mg by mouth 3 (three) times daily as  needed.      Marland Kitchen KLOR-CON M20 20 MEQ PO TBCR  take 1 tablet by mouth once daily 30 tablet 11  . LEVOFLOXACIN 500 MG PO TABS Oral Take 500 mg by mouth daily.      . LUBIPROSTONE 24 MCG PO CAPS Oral Take 24 mcg by mouth daily with breakfast.      . MAGNESIUM OXIDE 400 MG PO TABS Oral Take 400 mg by mouth. 2 tabs once daily     . OMEPRAZOLE 20 MG PO CPDR  take 1 capsule by mouth once daily 30 capsule 10  . PHENAZOPYRIDINE HCL 200 MG PO TABS  1 tid pc for painful urination 30 tablet 1  . PREGABALIN 50 MG PO CAPS Oral Take 1 capsule (50 mg total) by mouth 2 (two) times daily. 60 capsule 0    Pt needs ov for future refills.  . ALIGN 4 MG PO CAPS Oral Take 4 mg by mouth daily.      . SULFAMETHOXAZOLE-TRIMETHOPRIM 800-160 MG PO TABS Oral Take 1 tablet by mouth every 12 (twelve) hours. 14 tablet 0  . VALACYCLOVIR HCL 500 MG PO TABS Oral Take 500 mg by mouth as needed.      Marland Kitchen VITAMIN E 400 UNITS PO CAPS Oral Take 400 Units by mouth daily.      Marland Kitchen ZOLPIDEM TARTRATE 10 MG PO TABS Oral Take 1 tablet (10 mg total) by mouth at bedtime as needed. 90 tablet 3    BP 135/80  Pulse 96  Temp(Src) 98.3 F (36.8 C) (Oral)  Resp 19  SpO2 99%  Physical Exam  Nursing note and vitals reviewed. Constitutional: She appears well-developed and well-nourished.  Cardiovascular: Normal rate and regular rhythm.   Pulmonary/Chest: Effort normal and breath sounds normal.  Abdominal: Soft.  Genitourinary:       NO FLANK TENDERNESS    ED Course  Procedures (including critical care time)  Labs Reviewed  POCT URINALYSIS DIP (DEVICE) - Abnormal; Notable for the following:    Hgb urine dipstick TRACE (*)    Leukocytes, UA MODERATE (*) Biochemical Testing Only. Please order routine urinalysis from main lab if confirmatory testing is needed.   All other components within normal limits  POCT URINALYSIS DIPSTICK  URINE CULTURE   No results found.   1. UTI       MDM          Randa Spike,  MD 02/12/11  1610

## 2011-02-14 LAB — URINE CULTURE
Colony Count: >=100000
Culture  Setup Time: 201211190133

## 2011-02-15 NOTE — ED Notes (Signed)
Urine culture showed E. Coli.  Pt adequately treated with Septra DS.

## 2011-02-22 ENCOUNTER — Telehealth: Payer: Self-pay

## 2011-02-22 NOTE — Telephone Encounter (Signed)
Refill once until she can new provider.

## 2011-02-22 NOTE — Telephone Encounter (Signed)
Pls advise.  

## 2011-02-22 NOTE — Telephone Encounter (Signed)
Pt requesting refill on pregabalin (LYRICA) 50 MG capsule   [facility] [address]  Please contact pt when called in at [phone]

## 2011-02-23 MED ORDER — PREGABALIN 50 MG PO CAPS: 50.0000 mg | ORAL_CAPSULE | Freq: Two times a day (BID) | ORAL | Status: DC

## 2011-02-23 NOTE — Telephone Encounter (Signed)
rx sent in to pharmacy.  Pt has an appt with Dr. Felicity Coyer 02/24/11.

## 2011-02-24 ENCOUNTER — Encounter: Payer: Self-pay | Admitting: Internal Medicine

## 2011-02-24 ENCOUNTER — Ambulatory Visit (INDEPENDENT_AMBULATORY_CARE_PROVIDER_SITE_OTHER): Payer: Medicare Other | Admitting: Internal Medicine

## 2011-02-24 DIAGNOSIS — D638 Anemia in other chronic diseases classified elsewhere: Secondary | ICD-10-CM | POA: Insufficient documentation

## 2011-02-24 DIAGNOSIS — J309 Allergic rhinitis, unspecified: Secondary | ICD-10-CM

## 2011-02-24 DIAGNOSIS — G4700 Insomnia, unspecified: Secondary | ICD-10-CM

## 2011-02-24 DIAGNOSIS — D649 Anemia, unspecified: Secondary | ICD-10-CM

## 2011-02-24 DIAGNOSIS — F411 Generalized anxiety disorder: Secondary | ICD-10-CM

## 2011-02-24 DIAGNOSIS — M51379 Other intervertebral disc degeneration, lumbosacral region without mention of lumbar back pain or lower extremity pain: Secondary | ICD-10-CM

## 2011-02-24 DIAGNOSIS — M5137 Other intervertebral disc degeneration, lumbosacral region: Secondary | ICD-10-CM

## 2011-02-24 NOTE — Patient Instructions (Signed)
It was good to see you today. We have reviewed your prior records including labs and tests today Medications reviewed, no changes at this time. Please schedule followup in 6 months for review, call sooner if problems.

## 2011-02-24 NOTE — Progress Notes (Signed)
Subjective:    Patient ID: Alexis Olson, female    DOB: 09-18-1933, 77 y.o.   MRN: [id number]  HPI New patient to me, prior patient of [facility]'s. Here today to establish care at [facility]  Reviewed chronic medical issues: Chronic insomnia. Multiple medications taken for same. the patient reports compliance with medication(s) as prescribed. Denies adverse side effects.  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 Works with GI on same. Monthly B12 replacement and oral iron supplementation. History of IV iron infusions required. No signs of melena or bright red blood per rectum. No abdominal pain.  Chronic low back pain. History of complication following epidural steroid injection in 2010 causing osteomyelitis. 10 week antibiotic treatment with vancomycin Rocephin, resolved. Pain controlled with Lyrica as otherwise intolerant of narcotics. Unable to take anti-inflammatories and do to GI history of bypass and anemia  Allergic rhinitis. the patient reports compliance with medication(s) as prescribed. Denies adverse side effects.  Past Medical History  Diagnosis Date  . Obesity   . Lumbosacral disc disease     Chronic pain; History of osteomyelitis 2010 following ESI complication  . ALLERGIC RHINITIS   . 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 ANXIETY   . ASTHMA   . Bariatric surgery status   . COLONIC POLYPS, ADENOMATOUS, HX OF   . DIVERTICULOSIS, COLON   . HYPERTENSION   . INSOMNIA   . URINARY URGENCY   . VITAMIN B12 DEFICIENCY      Review of Systems  Constitutional: Negative for fever, fatigue and unexpected weight change.  Respiratory: Negative for cough and shortness of breath.   Cardiovascular: Negative for chest pain and palpitations.       Objective:   Physical Exam BP 130/92  Pulse 103  Temp(Src) 98.4 F (36.9 C) (Oral)  Resp 14  Ht 5\' 5"  (1.651 m)  Wt 89.359 kg (197 lb)  BMI  32.78 kg/m2  SpO2 97% Wt Readings from Last 3 Encounters:  02/24/11 89.359 kg (197 lb)  01/16/11 91.173 kg (201 lb)  02/03/10 89.812 kg (198 lb)   Constitutional: She is overweight, but appears well-developed and well-nourished. No distress.  Neck: Normal range of motion. Neck supple. No JVD present. No thyromegaly present.  Cardiovascular: Normal rate, regular rhythm and normal heart sounds.  No murmur heard. No BLE edema. Pulmonary/Chest: Effort normal and breath sounds normal. No respiratory distress. She has no wheezes.  Abdominal: Soft. Bowel sounds are normal. She exhibits no distension. There is no tenderness. no masses.  Psychiatric: She has a normal mood and affect. Her behavior is normal. Judgment and thought content normal.   Lab Results  Component Value Date   WBC 6.8 12/27/2010   HGB 12.1 12/27/2010   HCT 36.8 12/27/2010   PLT 284.0 12/27/2010   GLUCOSE 96 09/27/2010   CHOL 185 12/27/2010   TRIG 197.0* 12/27/2010   HDL 53.60 12/27/2010   LDLCALC 92 12/27/2010   ALT 18 09/27/2010   AST 18 09/27/2010   NA 138 09/27/2010   K 4.0 09/27/2010   CL 103 09/27/2010   CREATININE 0.5 09/27/2010   BUN 14 09/27/2010   CO2 26 09/27/2010   TSH 0.99 07/26/2006   INR 1.01 05/13/2009   HGBA1C 5.3 08/03/2006      Assessment & Plan:  See problem list. Medications and labs reviewed today.  Time spent with patient today 25 minutes, greater than 50% time spent  counseling patient on anemia, chronic low back pain and chronic insomnia, anxiety and medication review. Also review of prior records

## 2011-02-25 ENCOUNTER — Encounter: Payer: Self-pay | Admitting: Internal Medicine

## 2011-02-25 NOTE — Assessment & Plan Note (Signed)
Chronic issue, on multiple medications for treatment of same. Issue reviewed, and no changes recommended at this time

## 2011-02-25 NOTE — Assessment & Plan Note (Signed)
Chronic low back pain, has followed with physiatry for same History of epidural steroid injection complicated with osteomyelitis in 2010 -treated with 10 weeks antibiotics and resolved Uses Lyrica to help modulate pain symptoms as intolerant of other narcotics

## 2011-02-25 NOTE — Assessment & Plan Note (Signed)
Long history of same, complicated with panic attacks Uses Xanax as needed

## 2011-02-25 NOTE — Assessment & Plan Note (Signed)
Chronic issue related to malabsorption following prior bariatric surgery B12 injections as per GI with daily iron supplementation. History of IV iron infusion required but maintaining iron and hemoglobin levels Last labs reviewed, patient will follow with GI as needed and as ongoing Lab Results  Component Value Date   WBC 6.8 12/27/2010   HGB 12.1 12/27/2010   HCT 36.8 12/27/2010   MCV 97.2 12/27/2010   PLT 284.0 12/27/2010

## 2011-02-28 ENCOUNTER — Other Ambulatory Visit: Payer: Self-pay | Admitting: Internal Medicine

## 2011-03-28 ENCOUNTER — Other Ambulatory Visit: Payer: Self-pay | Admitting: Family Medicine

## 2011-04-12 ENCOUNTER — Other Ambulatory Visit: Payer: Self-pay | Admitting: Internal Medicine

## 2011-04-12 ENCOUNTER — Telehealth: Payer: Self-pay | Admitting: Internal Medicine

## 2011-04-12 DIAGNOSIS — D649 Anemia, unspecified: Secondary | ICD-10-CM

## 2011-04-12 NOTE — Telephone Encounter (Signed)
Per pt requesting ambien and alprazolam 1 mg---Pt ph# [phone]--cell

## 2011-04-12 NOTE — Telephone Encounter (Signed)
Left msg on vm needing refill for ambien & alprazolam [facility].Marland KitchenMarland Kitchen1/16/13@4 :24pm/LMB

## 2011-04-12 NOTE — Telephone Encounter (Signed)
Patient calling because she is due for her 3 month lab draw. No orders in computer. Please, advise.

## 2011-04-13 MED ORDER — ZOLPIDEM TARTRATE 10 MG PO TABS: 10.0000 mg | ORAL_TABLET | Freq: Every evening | ORAL | Status: DC | PRN

## 2011-04-13 MED ORDER — ALPRAZOLAM 1 MG PO TABS: 1.0000 mg | ORAL_TABLET | Freq: Every evening | ORAL | Status: DC | PRN

## 2011-04-13 NOTE — Telephone Encounter (Signed)
Last CBC 12/2010, please repeat CBC,Iron studies  When convenient for the pt.

## 2011-04-13 NOTE — Telephone Encounter (Signed)
Notified pt both meds sent to [facility] mail services.Marland KitchenMarland Kitchen1/17/13@8 :42am/LMB

## 2011-04-13 NOTE — Telephone Encounter (Signed)
Spoke with patient and told her lab orders are in and to come when it is convenient for her.

## 2011-04-18 ENCOUNTER — Other Ambulatory Visit (INDEPENDENT_AMBULATORY_CARE_PROVIDER_SITE_OTHER): Payer: Medicare Other

## 2011-04-18 DIAGNOSIS — D649 Anemia, unspecified: Secondary | ICD-10-CM

## 2011-04-18 LAB — CBC WITH DIFFERENTIAL/PLATELET
Basophils Absolute: 0 10*3/uL (ref 0.0–0.1)
Basophils Relative: 0.5 % (ref 0.0–3.0)
Eosinophils Absolute: 0.2 10*3/uL (ref 0.0–0.7)
Eosinophils Relative: 3.5 % (ref 0.0–5.0)
HCT: 32.2 % — ABNORMAL LOW (ref 36.0–46.0)
Hemoglobin: 10.7 g/dL — ABNORMAL LOW (ref 12.0–15.0)
Lymphocytes Relative: 36.3 % (ref 12.0–46.0)
Lymphs Abs: 2.5 10*3/uL (ref 0.7–4.0)
MCHC: 33.2 g/dL (ref 30.0–36.0)
MCV: 94.4 fl (ref 78.0–100.0)
Monocytes Absolute: 0.6 10*3/uL (ref 0.1–1.0)
Monocytes Relative: 9.5 % (ref 3.0–12.0)
Neutro Abs: 3.4 10*3/uL (ref 1.4–7.7)
Neutrophils Relative %: 50.2 % (ref 43.0–77.0)
Platelets: 225 10*3/uL (ref 150.0–400.0)
RBC: 3.41 Mil/uL — ABNORMAL LOW (ref 3.87–5.11)
RDW: 14.8 % — ABNORMAL HIGH (ref 11.5–14.6)
WBC: 6.8 10*3/uL (ref 4.5–10.5)

## 2011-04-19 LAB — IBC PANEL
Iron: 57 ug/dL (ref 42–145)
Saturation Ratios: 22.1 % (ref 20.0–50.0)
Transferrin: 183.9 mg/dL — ABNORMAL LOW (ref 212.0–360.0)

## 2011-04-20 ENCOUNTER — Telehealth: Payer: Self-pay | Admitting: *Deleted

## 2011-04-20 DIAGNOSIS — D649 Anemia, unspecified: Secondary | ICD-10-CM

## 2011-04-20 NOTE — Telephone Encounter (Signed)
Message copied by Daphine Deutscher on Thu Apr 20, 2011 11:18 AM ------      Message from: Hart Carwin      Created: Thu Apr 20, 2011  9:51 AM       Please call pt, Hgb has dropped from 12.1 to 10.7 since October. Please take iron and have it rechecked in 2 months, also get Iron,B12 at that time.

## 2011-04-20 NOTE — Telephone Encounter (Signed)
Labs in [facility] for 06/19/11. Note to remind patient. Patient notified of results and recommendation per Dr. Juanda Chance.

## 2011-05-25 ENCOUNTER — Telehealth: Payer: Self-pay | Admitting: Internal Medicine

## 2011-05-25 NOTE — Telephone Encounter (Signed)
Pt says [facility] need new info on the generic Ambien--Pt ph--cell---[phone]-Please call pt

## 2011-05-26 MED ORDER — ZOLPIDEM TARTRATE 10 MG PO TABS: 10.0000 mg | ORAL_TABLET | Freq: Every evening | ORAL | Status: DC | PRN

## 2011-05-26 NOTE — Telephone Encounter (Signed)
Ok to refill if needed

## 2011-05-31 ENCOUNTER — Telehealth: Payer: Self-pay | Admitting: Internal Medicine

## 2011-05-31 NOTE — Telephone Encounter (Signed)
The pt called and is hoping for a refill of Ambien (generic) sent through [facility].  The patient was advised to call [facility] and have them fax a request, but the pt refused stating "[facility] does not have fax machines".  I advised her this was probably not the case, but the patient then became agitated.    She stated the [facility] number to call was - [phone]  Thanks!

## 2011-06-01 NOTE — Telephone Encounter (Signed)
Notified [facility] spoke with Danielle/rep. She states they have received both rx's for zolpidem & alprazolam on 04/11/11 and was shipped out to pt on 04/19/11. Contacted pt she states she did received the meds. A letter was sent by [facility] letting her know she would need to contact md for another refill. Infom pt once the 90 day supply is gone then she can contact md for another script. Pt stated she was confused by the letter and did apologized about constantly calling... 3/@:pm/LMB

## 2011-06-02 ENCOUNTER — Telehealth: Payer: Self-pay | Admitting: Family Medicine

## 2011-06-02 NOTE — Telephone Encounter (Signed)
No this is a patient of Dr. Felicity Coyer

## 2011-06-02 NOTE — Telephone Encounter (Signed)
Refill request for Lyrica 50 mg take 1 po bid. Is this your pt?

## 2011-06-02 NOTE — Telephone Encounter (Signed)
Ok to refill lyrica as requested

## 2011-06-06 ENCOUNTER — Other Ambulatory Visit (INDEPENDENT_AMBULATORY_CARE_PROVIDER_SITE_OTHER): Payer: Medicare Other

## 2011-06-06 ENCOUNTER — Other Ambulatory Visit: Payer: Self-pay | Admitting: *Deleted

## 2011-06-06 DIAGNOSIS — D649 Anemia, unspecified: Secondary | ICD-10-CM

## 2011-06-06 DIAGNOSIS — D518 Other vitamin B12 deficiency anemias: Secondary | ICD-10-CM

## 2011-06-06 LAB — CBC WITH DIFFERENTIAL/PLATELET
Basophils Absolute: 0 10*3/uL (ref 0.0–0.1)
Basophils Relative: 0.4 % (ref 0.0–3.0)
Eosinophils Absolute: 0.3 10*3/uL (ref 0.0–0.7)
Eosinophils Relative: 3.7 % (ref 0.0–5.0)
HCT: 33.2 % — ABNORMAL LOW (ref 36.0–46.0)
Hemoglobin: 11 g/dL — ABNORMAL LOW (ref 12.0–15.0)
Lymphocytes Relative: 30.9 % (ref 12.0–46.0)
Lymphs Abs: 2.2 10*3/uL (ref 0.7–4.0)
MCHC: 33.3 g/dL (ref 30.0–36.0)
MCV: 95.7 fl (ref 78.0–100.0)
Monocytes Absolute: 0.6 10*3/uL (ref 0.1–1.0)
Monocytes Relative: 8.9 % (ref 3.0–12.0)
Neutro Abs: 4 10*3/uL (ref 1.4–7.7)
Neutrophils Relative %: 56.1 % (ref 43.0–77.0)
Platelets: 235 10*3/uL (ref 150.0–400.0)
RBC: 3.47 Mil/uL — ABNORMAL LOW (ref 3.87–5.11)
RDW: 14.2 % (ref 11.5–14.6)
WBC: 7.1 10*3/uL (ref 4.5–10.5)

## 2011-06-06 LAB — VITAMIN B12: Vitamin B-12: 313 pg/mL (ref 211–911)

## 2011-06-06 LAB — IBC PANEL
Iron: 56 ug/dL (ref 42–145)
Saturation Ratios: 20.8 % (ref 20.0–50.0)
Transferrin: 192.6 mg/dL — ABNORMAL LOW (ref 212.0–360.0)

## 2011-06-06 MED ORDER — PREGABALIN 50 MG PO CAPS: 50.0000 mg | ORAL_CAPSULE | Freq: Two times a day (BID) | ORAL | Status: DC

## 2011-06-06 NOTE — Telephone Encounter (Signed)
[facility] @ [phone]... 06/06/11@8 :53am/LMB

## 2011-06-06 NOTE — Telephone Encounter (Signed)
Received request for lyrica md already approved [facility] this am... 06/06/11@11 :36am/LMB

## 2011-06-07 ENCOUNTER — Telehealth: Payer: Self-pay | Admitting: Internal Medicine

## 2011-06-07 NOTE — Telephone Encounter (Signed)
Hgb 11.0, Hct 33.2, stable. Repeat 3 months

## 2011-06-07 NOTE — Telephone Encounter (Signed)
Patient calling to get lab results from yesterday. Please, advise.

## 2011-06-08 ENCOUNTER — Other Ambulatory Visit: Payer: Self-pay | Admitting: *Deleted

## 2011-06-08 DIAGNOSIS — D509 Iron deficiency anemia, unspecified: Secondary | ICD-10-CM

## 2011-06-08 NOTE — Telephone Encounter (Signed)
Patient given results and mailed results. Labs in [facility] and note to remind patient.

## 2011-06-16 ENCOUNTER — Telehealth: Payer: Self-pay | Admitting: *Deleted

## 2011-06-16 NOTE — Telephone Encounter (Signed)
Spoke with patient and she had labs last week

## 2011-06-16 NOTE — Telephone Encounter (Signed)
Left a message for patient to call me. 

## 2011-06-16 NOTE — Telephone Encounter (Signed)
Message copied by Daphine Deutscher on Fri Jun 16, 2011  8:59 AM ------      Message from: Daphine Deutscher      Created: Thu Apr 20, 2011 11:23 AM       Call and remind patient cbc, iron and b12 due for DB

## 2011-06-18 ENCOUNTER — Other Ambulatory Visit: Payer: Self-pay | Admitting: Family Medicine

## 2011-06-19 NOTE — Telephone Encounter (Signed)
Ok to fill 

## 2011-06-21 ENCOUNTER — Telehealth: Payer: Self-pay | Admitting: Internal Medicine

## 2011-06-21 NOTE — Telephone Encounter (Signed)
She has to have a REALLY good reason  To take ASA, it has to come from her PCP.

## 2011-06-21 NOTE — Telephone Encounter (Signed)
Spoke with patient and she is wondering if she can take Aspirin 81 mg/day.  Explained to patient that with a chronic GI blood loss, I doubt Dr. Juanda Chance would want her to do this but I would ask. Patient is aware it may be next week before I answer. Please, advise.

## 2011-06-22 NOTE — Telephone Encounter (Signed)
Patient given Dr. Brodie's recommendation. 

## 2011-07-04 ENCOUNTER — Telehealth: Payer: Self-pay | Admitting: Internal Medicine

## 2011-07-04 DIAGNOSIS — D649 Anemia, unspecified: Secondary | ICD-10-CM

## 2011-07-04 NOTE — Telephone Encounter (Signed)
Patient wants to know if we check her liver with her labs.Explained that we check her CBC and Iron. She wants to know if Dr. Juanda Chance will check her liver with next labs. Please, advise.

## 2011-07-04 NOTE — Telephone Encounter (Signed)
OK to draw c-met with next blood test. Last LFT's 09/2010 were normal

## 2011-07-05 NOTE — Telephone Encounter (Signed)
Spoke with patient and gave her Dr. Regino Schultze recommendations. Cmet added to labs for June 2013.

## 2011-07-18 ENCOUNTER — Other Ambulatory Visit: Payer: Self-pay | Admitting: Family Medicine

## 2011-08-10 ENCOUNTER — Telehealth: Payer: Self-pay | Admitting: Internal Medicine

## 2011-08-10 ENCOUNTER — Telehealth: Payer: Self-pay | Admitting: *Deleted

## 2011-08-10 NOTE — Telephone Encounter (Signed)
Spoke with patient and she wanted to know the "procedure" she had to check her kidneys. Told patient the last imaging was ultrasound of abdomen and it was normal. She states this is the test she was talking about. She states she is having hypotension. She will call her PCP for this.

## 2011-08-10 NOTE — Telephone Encounter (Signed)
Left msg on vm have appt on 07/17/11 need to know if md going to do labs/urine prior to appt... 08/10/11@5 :01pm/LMB

## 2011-08-11 NOTE — Telephone Encounter (Signed)
Come fasting to appt - we will decide at appt what tests need to be done

## 2011-08-11 NOTE — Telephone Encounter (Signed)
Notified pt with md response... 08/11/11@10 :15am/LMb

## 2011-08-16 ENCOUNTER — Other Ambulatory Visit (INDEPENDENT_AMBULATORY_CARE_PROVIDER_SITE_OTHER): Payer: Medicare Other

## 2011-08-16 ENCOUNTER — Ambulatory Visit (INDEPENDENT_AMBULATORY_CARE_PROVIDER_SITE_OTHER): Payer: Medicare Other | Admitting: Internal Medicine

## 2011-08-16 ENCOUNTER — Encounter: Payer: Self-pay | Admitting: Internal Medicine

## 2011-08-16 VITALS — BP 130/80 | HR 91 | Temp 98.1°F | Ht 65.5 in | Wt 197.4 lb

## 2011-08-16 DIAGNOSIS — R3915 Urgency of urination: Secondary | ICD-10-CM

## 2011-08-16 DIAGNOSIS — R5383 Other fatigue: Secondary | ICD-10-CM

## 2011-08-16 DIAGNOSIS — R5381 Other malaise: Secondary | ICD-10-CM

## 2011-08-16 LAB — BASIC METABOLIC PANEL
BUN: 15 mg/dL (ref 6–23)
CO2: 27 mEq/L (ref 19–32)
Calcium: 9 mg/dL (ref 8.4–10.5)
Chloride: 97 mEq/L (ref 96–112)
Creatinine, Ser: 0.5 mg/dL (ref 0.4–1.2)
GFR: 124.52 mL/min (ref >60.00)
Glucose, Bld: 81 mg/dL (ref 70–99)
Potassium: 4 mEq/L (ref 3.5–5.1)
Sodium: 133 mEq/L — ABNORMAL LOW (ref 135–145)

## 2011-08-16 LAB — URINALYSIS, ROUTINE W REFLEX MICROSCOPIC
Bilirubin Urine: NEGATIVE
Hgb urine dipstick: NEGATIVE
Ketones, ur: NEGATIVE
Nitrite: NEGATIVE
Specific Gravity, Urine: 1.01 (ref 1.000–1.030)
Total Protein, Urine: NEGATIVE
Urine Glucose: NEGATIVE
Urobilinogen, UA: 0.2 (ref 0.0–1.0)
pH: 6 (ref 5.0–8.0)

## 2011-08-16 LAB — TSH: TSH: 0.83 u[IU]/mL (ref 0.35–5.50)

## 2011-08-16 MED ORDER — NITROFURANTOIN MONOHYD MACRO 100 MG PO CAPS: 100.0000 mg | ORAL_CAPSULE | Freq: Two times a day (BID) | ORAL | Status: AC

## 2011-08-16 NOTE — Patient Instructions (Signed)
It was good to see you today. Test(s) ordered today. Your results will be called to you after review (48-72hours after test completion). If any changes need to be made, you will be notified at that time. Start macrobid for UTI - other medications reviewed, no changes at this time. Please schedule followup in 6 months for review, call sooner if problems.

## 2011-08-16 NOTE — Assessment & Plan Note (Signed)
Hx chronic and recurrent UTI - Periodically follows with uro (grapey) check UA/Ucx now Reports septra/levaquin cause muscle aches - will try macrobid

## 2011-08-16 NOTE — Progress Notes (Signed)
Subjective:    Patient ID: Alexis Olson, female    DOB: [birth year], 78 y.o.   MRN: [id number]  HPI  Here for follow up - reviewed chronic medical issues: Chronic insomnia. Multiple medications taken for same. the patient reports compliance with medication(s) as prescribed. Denies adverse side effects.  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 Works with GI on same. Monthly B12 replacement and oral iron supplementation. History of IV iron infusions required. No signs of melena or bright red blood per rectum. No abdominal pain.  Chronic low back pain. History of complication following epidural steroid injection in 2010 causing osteomyelitis. 10 week antibiotic treatment with vancomycin Rocephin, resolved. Pain controlled with Lyrica as otherwise intolerant of narcotics. Unable to take anti-inflammatories and do to GI history of bypass and anemia  Allergic rhinitis. the patient reports compliance with medication(s) as prescribed. Denies adverse side effects.  Reports low BP readings in evening SBP 90-100 associated with fatigue sensation  Past Medical History  Diagnosis Date  . Obesity   . Lumbosacral disc disease     Chronic pain; History of osteomyelitis 2010 following ESI complication  . ALLERGIC RHINITIS   . 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 ANXIETY   . ASTHMA   . Bariatric surgery status   . COLONIC POLYPS, ADENOMATOUS, HX OF   . DIVERTICULOSIS, COLON   . HYPERTENSION   . INSOMNIA   . URINARY URGENCY   . VITAMIN B12 DEFICIENCY      Review of Systems  Constitutional: Positive for fatigue. Negative for fever and unexpected weight change.  Respiratory: Negative for cough and shortness of breath.   Cardiovascular: Negative for chest pain and palpitations.       Objective:   Physical Exam  BP 130/80  Pulse 91  Temp(Src) 98.1 F (36.7 C) (Oral)  Ht 5' 5.5" (1.664 m)  Wt 197  lb 6.4 oz (89.54 kg)  BMI 32.35 kg/m2  SpO2 97% Wt Readings from Last 3 Encounters:  08/16/11 197 lb 6.4 oz (89.54 kg)  02/24/11 197 lb (89.359 kg)  01/16/11 201 lb (91.173 kg)   Constitutional: She is overweight, but appears well-developed and well-nourished. No distress.  Neck: Normal range of motion. Neck supple. No JVD present. No thyromegaly present.  Cardiovascular: Normal rate, regular rhythm and normal heart sounds.  No murmur heard. No BLE edema. Pulmonary/Chest: Effort normal and breath sounds normal. No respiratory distress. She has no wheezes.  Abdominal: Soft. Bowel sounds are normal. She exhibits no distension. There is no tenderness. no masses.  Psychiatric: She has a normal mood and affect. Her behavior is normal. Judgment and thought content normal.   Lab Results  Component Value Date   WBC 7.1 06/06/2011   HGB 11.0* 06/06/2011   HCT 33.2* 06/06/2011   PLT 235.0 06/06/2011   GLUCOSE 96 09/27/2010   CHOL 185 12/27/2010   TRIG 197.0* 12/27/2010   HDL 53.60 12/27/2010   LDLCALC 92 12/27/2010   ALT 18 09/27/2010   AST 18 09/27/2010   NA 138 09/27/2010   K 4.0 09/27/2010   CL 103 09/27/2010   CREATININE 0.5 09/27/2010   BUN 14 09/27/2010   CO2 26 09/27/2010   TSH 0.99 07/26/2006   INR 1.01 05/13/2009   HGBA1C 5.3 08/03/2006      Assessment & Plan:  See problem list. Medications and labs reviewed today.  Fatigue - nonspecific hx/exam -  will check labs 

## 2011-08-17 LAB — URINE CULTURE
Colony Count: NO GROWTH
Organism ID, Bacteria: NO GROWTH

## 2011-08-19 ENCOUNTER — Other Ambulatory Visit: Payer: Self-pay | Admitting: Internal Medicine

## 2011-08-22 ENCOUNTER — Other Ambulatory Visit: Payer: Self-pay | Admitting: Family Medicine

## 2011-09-07 ENCOUNTER — Telehealth: Payer: Self-pay | Admitting: *Deleted

## 2011-09-07 NOTE — Telephone Encounter (Signed)
Left a message for patient to come for labs next week fr Dr. Juanda Chance.

## 2011-09-07 NOTE — Telephone Encounter (Signed)
Message copied by Daphine Deutscher on Thu Sep 07, 2011  8:39 AM ------      Message from: Daphine Deutscher      Created: Thu Jun 08, 2011  9:19 AM       Cal and remind patient due for CBC, B12 level, iron for DB on 6/17

## 2011-09-14 ENCOUNTER — Ambulatory Visit: Payer: Medicare Other

## 2011-09-14 DIAGNOSIS — D649 Anemia, unspecified: Secondary | ICD-10-CM

## 2011-09-14 DIAGNOSIS — D509 Iron deficiency anemia, unspecified: Secondary | ICD-10-CM

## 2011-09-14 LAB — CBC WITH DIFFERENTIAL/PLATELET
Basophils Absolute: 0 10*3/uL (ref 0.0–0.1)
Basophils Relative: 0.4 % (ref 0.0–3.0)
Eosinophils Absolute: 0.2 10*3/uL (ref 0.0–0.7)
Eosinophils Relative: 2.3 % (ref 0.0–5.0)
HCT: 37.7 % (ref 36.0–46.0)
Hemoglobin: 12.4 g/dL (ref 12.0–15.0)
Lymphocytes Relative: 32.7 % (ref 12.0–46.0)
Lymphs Abs: 2.6 10*3/uL (ref 0.7–4.0)
MCHC: 33 g/dL (ref 30.0–36.0)
MCV: 98.3 fl (ref 78.0–100.0)
Monocytes Absolute: 0.7 10*3/uL (ref 0.1–1.0)
Monocytes Relative: 8.4 % (ref 3.0–12.0)
Neutro Abs: 4.5 10*3/uL (ref 1.4–7.7)
Neutrophils Relative %: 56.2 % (ref 43.0–77.0)
Platelets: 275 10*3/uL (ref 150.0–400.0)
RBC: 3.84 Mil/uL — ABNORMAL LOW (ref 3.87–5.11)
RDW: 14.1 % (ref 11.5–14.6)
WBC: 8.1 10*3/uL (ref 4.5–10.5)

## 2011-09-14 LAB — IBC PANEL
Iron: 51 ug/dL (ref 42–145)
Saturation Ratios: 16.3 % — ABNORMAL LOW (ref 20.0–50.0)
Transferrin: 223.5 mg/dL (ref 212.0–360.0)

## 2011-09-14 LAB — COMPREHENSIVE METABOLIC PANEL
ALT: 15 U/L (ref 0–35)
AST: 21 U/L (ref 0–37)
Albumin: 4.2 g/dL (ref 3.5–5.2)
Alkaline Phosphatase: 51 U/L (ref 39–117)
BUN: 12 mg/dL (ref 6–23)
CO2: 28 mEq/L (ref 19–32)
Calcium: 8.8 mg/dL (ref 8.4–10.5)
Chloride: 99 mEq/L (ref 96–112)
Creatinine, Ser: 0.8 mg/dL (ref 0.4–1.2)
GFR: 76.24 mL/min (ref >60.00)
Glucose, Bld: 97 mg/dL (ref 70–99)
Potassium: 4 mEq/L (ref 3.5–5.1)
Sodium: 136 mEq/L (ref 135–145)
Total Bilirubin: 0.2 mg/dL — ABNORMAL LOW (ref 0.3–1.2)
Total Protein: 7.3 g/dL (ref 6.0–8.3)

## 2011-09-15 ENCOUNTER — Other Ambulatory Visit: Payer: Self-pay | Admitting: *Deleted

## 2011-09-15 DIAGNOSIS — D649 Anemia, unspecified: Secondary | ICD-10-CM

## 2011-09-15 DIAGNOSIS — E538 Deficiency of other specified B group vitamins: Secondary | ICD-10-CM

## 2011-09-15 LAB — VITAMIN B12: Vitamin B-12: 374 pg/mL (ref 211–911)

## 2011-10-04 ENCOUNTER — Other Ambulatory Visit: Payer: Self-pay | Admitting: Internal Medicine

## 2011-10-13 ENCOUNTER — Telehealth: Payer: Self-pay | Admitting: Internal Medicine

## 2011-10-13 ENCOUNTER — Other Ambulatory Visit: Payer: Self-pay

## 2011-10-13 MED ORDER — ZOLPIDEM TARTRATE 10 MG PO TABS: 10.0000 mg | ORAL_TABLET | Freq: Every evening | ORAL | Status: DC | PRN

## 2011-10-13 NOTE — Telephone Encounter (Signed)
It is OK to substitute Centratex for Hemocyte. Recheck CBC, Iron studies in 4-6 weeks

## 2011-10-13 NOTE — Telephone Encounter (Signed)
Faxed

## 2011-10-13 NOTE — Telephone Encounter (Signed)
Please advise Zolpidem refill? Last RX wrote on 05-26-11 quantity 90 with 1 refill. Fax to [phone]

## 2011-10-13 NOTE — Telephone Encounter (Signed)
Patient has been taking Hemocyte Plus. She states the generic Centratex is much cheaper. She wants to know if Dr. Juanda Chance is okay with her taking this instead. Please, advise

## 2011-10-16 NOTE — Telephone Encounter (Signed)
Spoke with patient and gave her Dr. Regino Schultze recommendation. Labs already scheduled

## 2011-10-19 ENCOUNTER — Telehealth: Payer: Self-pay | Admitting: Internal Medicine

## 2011-10-19 NOTE — Telephone Encounter (Signed)
Patients states that the generic Hemocyte Plus needs a prescription to get.

## 2011-10-20 ENCOUNTER — Encounter: Payer: Self-pay | Admitting: Gastroenterology

## 2011-10-20 MED ORDER — CENTRATEX 106-1 MG PO CAPS: ORAL_CAPSULE | ORAL | Status: DC

## 2011-10-20 NOTE — Telephone Encounter (Signed)
rx sent to pharmacy

## 2011-10-27 ENCOUNTER — Telehealth: Payer: Self-pay | Admitting: Internal Medicine

## 2011-10-27 MED ORDER — CENTRATEX 106-1 MG PO CAPS: ORAL_CAPSULE | ORAL | Status: DC

## 2011-11-20 ENCOUNTER — Telehealth: Payer: Self-pay | Admitting: Internal Medicine

## 2011-11-20 NOTE — Telephone Encounter (Signed)
Caller: [name]/Patient; Patient Name: Alexis Olson; PCP: Rene Paci (Adults only); Best Callback Phone Number: [phone]; Reason for call: Caller reports she has had hoarseness for the past 6 weeks. Afebrile and no other sxs of illness. Per Sore Throat or Hoarseness Protocol, Hoarseness that has persisted more than 2 weeks, See in 2 weeks Protocol. Home care for sxs given. Appt scheduled for Thurs 8/29 for 11:45 with PCP. Caller is agreeable.

## 2011-11-23 ENCOUNTER — Encounter: Payer: Self-pay | Admitting: Internal Medicine

## 2011-11-23 ENCOUNTER — Ambulatory Visit (INDEPENDENT_AMBULATORY_CARE_PROVIDER_SITE_OTHER): Payer: BC Managed Care – PPO | Admitting: Internal Medicine

## 2011-11-23 VITALS — BP 112/62 | HR 89 | Temp 98.2°F | Ht 65.5 in | Wt 192.1 lb

## 2011-11-23 DIAGNOSIS — J309 Allergic rhinitis, unspecified: Secondary | ICD-10-CM

## 2011-11-23 DIAGNOSIS — R490 Dysphonia: Secondary | ICD-10-CM

## 2011-11-23 DIAGNOSIS — K219 Gastro-esophageal reflux disease without esophagitis: Secondary | ICD-10-CM

## 2011-11-23 MED ORDER — METHYLPREDNISOLONE ACETATE 80 MG/ML IJ SUSP
120.0000 mg | Freq: Once | INTRAMUSCULAR | Status: AC
  Administered 2011-11-23: 120 mg via INTRAMUSCULAR

## 2011-11-23 MED ORDER — PHENAZOPYRIDINE HCL 200 MG PO TABS: 200.0000 mg | ORAL_TABLET | Freq: Three times a day (TID) | ORAL | Status: DC | PRN

## 2011-11-23 NOTE — Progress Notes (Signed)
Subjective:    Patient ID: Alexis Olson, female    DOB: 05-26-1933, 78 y.o.   MRN: [id number]  HPI complains of hoarseness Onset 6 weeks Denies precipitating URI or illness associated with PND/allergic rhinitis and occasional reflux No sore throat or swallowing trouble  Past Medical History  Diagnosis Date  . Obesity   . Lumbosacral disc disease     Chronic pain; History of osteomyelitis 2010 following ESI complication  . ALLERGIC RHINITIS   . 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 ANXIETY   . ASTHMA   . Bariatric surgery status   . COLONIC POLYPS, ADENOMATOUS, HX OF   . DIVERTICULOSIS, COLON   . INSOMNIA   . URINARY URGENCY   . VITAMIN B12 DEFICIENCY   . Hypotension     Review of Systems  Constitutional: Positive for fatigue. Negative for fever and unexpected weight change.  HENT: Positive for postnasal drip. Negative for hearing loss, congestion, sore throat, rhinorrhea, mouth sores, neck pain, neck stiffness, dental problem, sinus pressure and ear discharge.   Respiratory: Negative for cough, shortness of breath and wheezing.   Cardiovascular: Negative for chest pain.  Neurological: Negative for dizziness and headaches.       Objective:   Physical Exam BP 112/62  Pulse 89  Temp 98.2 F (36.8 C) (Oral)  Ht 5' 5.5" (1.664 m)  Wt 192 lb 1.9 oz (87.145 kg)  BMI 31.48 kg/m2  SpO2 97% Wt Readings from Last 3 Encounters:  11/23/11 192 lb 1.9 oz (87.145 kg)  08/16/11 197 lb 6.4 oz (89.54 kg)  02/24/11 197 lb (89.359 kg)   Constitutional: She is hoarse, but appears well-developed and well-nourished. No distress.  HENT: Head: Normocephalic and atraumatic. Ears: B TMs ok, no erythema or effusion; Nose: Nose normal.  Mouth/Throat: Oropharynx is mildly red with clear PND and cobblestoning, but clear and moist. No oropharyngeal exudate.  Eyes: Conjunctivae and EOM are normal. Pupils are equal, round, and reactive to light. No  scleral icterus.  Neck: Normal range of motion. Neck supple. No JVD present. No thyromegaly present.  Cardiovascular: Normal rate, regular rhythm and normal heart sounds.  No murmur heard. No BLE edema. Pulmonary/Chest: Effort normal and breath sounds normal. No respiratory distress. She has no wheezes. Psychiatric: She has a normal mood and affect. Her behavior is normal. Judgment and thought content normal.   Lab Results  Component Value Date   WBC 8.1 09/14/2011   HGB 12.4 09/14/2011   HCT 37.7 09/14/2011   PLT 275.0 09/14/2011   GLUCOSE 97 09/14/2011   CHOL 185 12/27/2010   TRIG 197.0* 12/27/2010   HDL 53.60 12/27/2010   LDLCALC 92 12/27/2010   ALT 15 09/14/2011   AST 21 09/14/2011   NA 136 09/14/2011   K 4.0 09/14/2011   CL 99 09/14/2011   CREATININE 0.8 09/14/2011   BUN 12 09/14/2011   CO2 28 09/14/2011   TSH 0.83 08/16/2011   INR 1.01 05/13/2009   HGBA1C 5.3 08/03/2006        Assessment & Plan:   Hoarseness, nonsmoker, but ongoing > 6 weeks suspect related to severe allergic rhinitis with PND as well as subop control GERD - follows with allergist and GI for same and reports med compliance Medol shot for ongoing allergy flare and continue other meds Refer to ENT for eval if unresolved symptoms, esp with mild weight loss  allergic rhinitis, severe - periodic medrol shots from  allergist to control flares - requests same now - done

## 2011-11-23 NOTE — Patient Instructions (Addendum)
It was good to see you today. Medrol shot today for allergy symptoms we'll make referral to Dr Ezzard Standing for Ear Nose and Throat evaluation. Our office will contact you regarding appointment(s) once made. Medications reviewed and updated, no changes at this time.Hoarseness Hoarseness is produced from a variety of causes. It is important to find the cause so it can be treated. In the absence of a cold or upper respiratory illness, any hoarseness lasting more than 2 weeks should be looked at by a specialist. This is especially important if you have a history of smoking or alcohol use. It is also important to keep in mind that as you grow older, your voice will naturally get weaker, making it easier for you to become hoarse from straining your vocal cords.   CAUSES   Any illness that affects your vocal cords can result in a hoarse voice. Examples of conditions that can affect the vocal cords are listed as follows:    Allergies.   Colds.   Sinusitis.   Gastroesophageal reflux disease.   Croup.   Injury.   Nodules.   Exposure to smoke or toxic fumes or gases.   Congenital and genetic defects.   Paralysis of the vocal cords.   Infections.   Advanced age.  DIAGNOSIS   In order to diagnose the cause of your hoarseness, your caregiver will examine your throat using an instrument that uses a tube with a small lighted camera (laryngoscope). It allows your caregiver to look into the mouth and down the throat. TREATMENT   For most cases, treatment will focus on the specific cause of the hoarseness. Depending on the cause, hoarseness can be a temporary condition (acute) or it can be long lasting (chronic). Most cases of hoarseness clear up without complications. Your caregiver will explain to you if this is not likely to happen. SEEK IMMEDIATE MEDICAL CARE IF:    You have increasing hoarseness or loss of voice.   You have shortness of breath.   You are coughing up blood.   There is pain in  your neck or throat.  Document Released: 02/24/2005 Document Revised: 03/02/2011 Document Reviewed: 05/19/2010 [facility] Patient Information 2012 [place], [place].

## 2011-12-15 ENCOUNTER — Other Ambulatory Visit: Payer: Self-pay | Admitting: General Practice

## 2011-12-15 ENCOUNTER — Other Ambulatory Visit: Payer: Self-pay | Admitting: Internal Medicine

## 2011-12-18 ENCOUNTER — Other Ambulatory Visit: Payer: Self-pay | Admitting: General Practice

## 2011-12-18 ENCOUNTER — Other Ambulatory Visit (INDEPENDENT_AMBULATORY_CARE_PROVIDER_SITE_OTHER): Payer: BC Managed Care – PPO

## 2011-12-18 DIAGNOSIS — E538 Deficiency of other specified B group vitamins: Secondary | ICD-10-CM

## 2011-12-18 DIAGNOSIS — D649 Anemia, unspecified: Secondary | ICD-10-CM

## 2011-12-18 LAB — CBC WITH DIFFERENTIAL/PLATELET
Basophils Absolute: 0 10*3/uL (ref 0.0–0.1)
Basophils Relative: 0.3 % (ref 0.0–3.0)
Eosinophils Absolute: 0.2 10*3/uL (ref 0.0–0.7)
Eosinophils Relative: 2.8 % (ref 0.0–5.0)
HCT: 34.7 % — ABNORMAL LOW (ref 36.0–46.0)
Hemoglobin: 11.3 g/dL — ABNORMAL LOW (ref 12.0–15.0)
Lymphocytes Relative: 32.2 % (ref 12.0–46.0)
Lymphs Abs: 2.4 10*3/uL (ref 0.7–4.0)
MCHC: 32.7 g/dL (ref 30.0–36.0)
MCV: 97.5 fl (ref 78.0–100.0)
Monocytes Absolute: 0.6 10*3/uL (ref 0.1–1.0)
Monocytes Relative: 7.7 % (ref 3.0–12.0)
Neutro Abs: 4.2 10*3/uL (ref 1.4–7.7)
Neutrophils Relative %: 57 % (ref 43.0–77.0)
Platelets: 245 10*3/uL (ref 150.0–400.0)
RBC: 3.56 Mil/uL — ABNORMAL LOW (ref 3.87–5.11)
RDW: 14.6 % (ref 11.5–14.6)
WBC: 7.4 10*3/uL (ref 4.5–10.5)

## 2011-12-19 LAB — IBC PANEL
Iron: 42 ug/dL (ref 42–145)
Saturation Ratios: 14.8 % — ABNORMAL LOW (ref 20.0–50.0)
Transferrin: 203.3 mg/dL — ABNORMAL LOW (ref 212.0–360.0)

## 2011-12-19 NOTE — Telephone Encounter (Signed)
Pt notified Rx was ready

## 2011-12-20 ENCOUNTER — Telehealth: Payer: Self-pay | Admitting: Internal Medicine

## 2011-12-20 NOTE — Telephone Encounter (Signed)
Left a message to call me 

## 2011-12-21 ENCOUNTER — Other Ambulatory Visit: Payer: Self-pay | Admitting: *Deleted

## 2011-12-21 DIAGNOSIS — D649 Anemia, unspecified: Secondary | ICD-10-CM

## 2011-12-21 NOTE — Telephone Encounter (Signed)
Patient given results as per Dr. Juanda Chance. See result note.

## 2011-12-22 ENCOUNTER — Other Ambulatory Visit: Payer: Self-pay | Admitting: *Deleted

## 2011-12-22 DIAGNOSIS — D649 Anemia, unspecified: Secondary | ICD-10-CM

## 2011-12-24 ENCOUNTER — Other Ambulatory Visit: Payer: Self-pay | Admitting: Internal Medicine

## 2012-01-01 ENCOUNTER — Other Ambulatory Visit: Payer: Self-pay | Admitting: Internal Medicine

## 2012-01-02 ENCOUNTER — Other Ambulatory Visit: Payer: Self-pay | Admitting: *Deleted

## 2012-01-02 MED ORDER — ALPRAZOLAM 1 MG PO TABS: 1.0000 mg | ORAL_TABLET | Freq: Every evening | ORAL | Status: DC | PRN

## 2012-01-02 NOTE — Telephone Encounter (Signed)
Rx faxed to [facility]

## 2012-01-02 NOTE — Telephone Encounter (Signed)
R'cd fax from [facility] for refill of Alprazolam-last written 04/12/2011 #90 with 3 refills to [facility]-please advise.

## 2012-01-02 NOTE — Telephone Encounter (Signed)
ok 

## 2012-01-11 ENCOUNTER — Other Ambulatory Visit: Payer: Self-pay | Admitting: Internal Medicine

## 2012-01-11 MED ORDER — ZOLPIDEM TARTRATE 10 MG PO TABS: 10.0000 mg | ORAL_TABLET | Freq: Every evening | ORAL | Status: DC | PRN

## 2012-01-11 NOTE — Telephone Encounter (Signed)
Done hardcopy to robin  

## 2012-01-12 ENCOUNTER — Other Ambulatory Visit: Payer: Self-pay | Admitting: *Deleted

## 2012-01-12 NOTE — Telephone Encounter (Signed)
Medication refill for zolpidem faxed to [facility]

## 2012-01-26 ENCOUNTER — Other Ambulatory Visit: Payer: Self-pay | Admitting: *Deleted

## 2012-01-26 MED ORDER — DIPHENOXYLATE-ATROPINE 2.5-0.025 MG PO TABS: 1.0000 | ORAL_TABLET | Freq: Every day | ORAL | Status: DC | PRN

## 2012-01-26 NOTE — Telephone Encounter (Signed)
rx sent

## 2012-02-06 ENCOUNTER — Telehealth: Payer: Self-pay | Admitting: *Deleted

## 2012-02-06 ENCOUNTER — Ambulatory Visit (INDEPENDENT_AMBULATORY_CARE_PROVIDER_SITE_OTHER): Payer: BC Managed Care – PPO

## 2012-02-06 DIAGNOSIS — Z23 Encounter for immunization: Secondary | ICD-10-CM

## 2012-02-06 DIAGNOSIS — Z2911 Encounter for prophylactic immunotherapy for respiratory syncytial virus (RSV): Secondary | ICD-10-CM

## 2012-02-06 NOTE — Telephone Encounter (Signed)
Received fax pt needing PA on omeprazole 20 mg. Notified Optometrist over [place]. Case ID # [id number]...lmb

## 2012-02-07 ENCOUNTER — Other Ambulatory Visit (INDEPENDENT_AMBULATORY_CARE_PROVIDER_SITE_OTHER): Payer: Medicare Other

## 2012-02-07 ENCOUNTER — Ambulatory Visit (INDEPENDENT_AMBULATORY_CARE_PROVIDER_SITE_OTHER): Payer: Medicare Other | Admitting: Internal Medicine

## 2012-02-07 ENCOUNTER — Encounter: Payer: Self-pay | Admitting: Internal Medicine

## 2012-02-07 VITALS — BP 138/76 | HR 99 | Temp 97.6°F

## 2012-02-07 DIAGNOSIS — R079 Chest pain, unspecified: Secondary | ICD-10-CM

## 2012-02-07 DIAGNOSIS — F411 Generalized anxiety disorder: Secondary | ICD-10-CM

## 2012-02-07 DIAGNOSIS — R9431 Abnormal electrocardiogram [ECG] [EKG]: Secondary | ICD-10-CM

## 2012-02-07 NOTE — Assessment & Plan Note (Signed)
Long history of same, complicated with panic attacks Uses Xanax as needed 

## 2012-02-07 NOTE — Progress Notes (Signed)
Subjective:    Patient ID: Alexis Olson, female    DOB: Feb 25, 1934, 78 y.o.   MRN: [id number]  Chest Pain  This is a new problem. The current episode started in the past 7 days. The onset quality is gradual. The problem occurs intermittently. The problem has been waxing and waning. The pain is present in the substernal region and lateral region. The pain is moderate. The quality of the pain is described as burning, tightness, pressure and sharp. The pain radiates to the left shoulder. Pertinent negatives include no abdominal pain, cough, diaphoresis, dizziness, exertional chest pressure, fever, irregular heartbeat, malaise/fatigue, nausea, near-syncope, palpitations, shortness of breath, syncope or vomiting.  Her past medical history is significant for anxiety/panic attacks, rheumatic fever and TIA.  Pertinent negatives for past medical history include no CAD, no DVT, no hypertension and no recent injury. Prior workup: >10 years ago.   Past Medical History  Diagnosis Date  . Obesity   . Lumbosacral disc disease     Chronic pain; History of osteomyelitis 2010 following ESI complication  . ALLERGIC RHINITIS   . 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 ANXIETY   . ASTHMA   . Bariatric surgery status   . COLONIC POLYPS, ADENOMATOUS, HX OF   . DIVERTICULOSIS, COLON   . INSOMNIA   . URINARY URGENCY   . VITAMIN B12 DEFICIENCY   . Hypotension     Review of Systems  Constitutional: Negative for fever, malaise/fatigue and diaphoresis.  Respiratory: Negative for cough and shortness of breath.   Cardiovascular: Positive for chest pain. Negative for palpitations, syncope and near-syncope.  Gastrointestinal: Negative for nausea, vomiting and abdominal pain.  Neurological: Negative for dizziness.       Objective:   Physical Exam BP 138/76  Pulse 99  Temp 97.6 F (36.4 C) (Oral)  SpO2 95% Wt Readings from Last 3 Encounters:  11/23/11 192 lb 1.9  oz (87.145 kg)  08/16/11 197 lb 6.4 oz (89.54 kg)  02/24/11 197 lb (89.359 kg)   Constitutional: She appears well-developed and well-nourished. No distress.  Neck: Normal range of motion. Neck supple. No JVD present. No thyromegaly present.  Cardiovascular: Normal rate, regular rhythm and normal heart sounds.  No murmur heard. No BLE edema. Pulmonary/Chest: Effort normal and breath sounds normal. No respiratory distress. She has no wheezes.  Abdominal: Soft. Bowel sounds are normal. She exhibits no distension. There is no tenderness. no masses Musculoskeletal: Normal range of motion, no joint effusions. No gross deformities. non tender L shoulder with FROM Psychiatric: She has an anxious mood and affect. Her behavior is normal. Judgment and thought content normal.   Lab Results  Component Value Date   WBC 7.4 12/18/2011   HGB 11.3* 12/18/2011   HCT 34.7* 12/18/2011   PLT 245.0 12/18/2011   GLUCOSE 97 09/14/2011   CHOL 185 12/27/2010   TRIG 197.0* 12/27/2010   HDL 53.60 12/27/2010   LDLCALC 92 12/27/2010   ALT 15 09/14/2011   AST 21 09/14/2011   NA 136 09/14/2011   K 4.0 09/14/2011   CL 99 09/14/2011   CREATININE 0.8 09/14/2011   BUN 12 09/14/2011   CO2 28 09/14/2011   TSH 0.83 08/16/2011   INR 1.01 05/13/2009   HGBA1C 5.3 08/03/2006     ECG: sinus @ 96 bpm - anteroseptal infarct and IV conduction delay (unchanged from 03/30/09)    Assessment & Plan:  chest pain - atypical -  exacerbated by emotional stress Abnormal ECG, unchanged from 03/2009  Check cardiac enz x 1 now Refer for stress test Start ASA 81 qd until stress test done

## 2012-02-07 NOTE — Telephone Encounter (Signed)
Received PA place on md desk for completion...Raechel Chute

## 2012-02-07 NOTE — Patient Instructions (Signed)
It was good to see you today. We have reviewed your prior records including labs and tests today Take Aspirin 81 mg daily until stress test done Test(s) ordered today. Your results will be released to MyChart (or called to you) after review, usually within 72hours after test completion. If any changes need to be made, you will be notified at that same time. we'll make referral for cardiac stress test. Our office will contact you regarding appointment(s) once made.

## 2012-02-08 ENCOUNTER — Telehealth: Payer: Self-pay

## 2012-02-08 LAB — CARDIAC PANEL
CK-MB: 1.2 ng/mL (ref 0.3–4.0)
Relative Index: 2.1 calc (ref 0.0–2.5)
Total CK: 56 U/L (ref 7–177)

## 2012-02-08 NOTE — Telephone Encounter (Signed)
Pt called requesting results of Cardiac panel completed yesterday, please advise.

## 2012-02-08 NOTE — Telephone Encounter (Signed)
MD sign PA fax back to insurance waiting on status from insurance comp...Raechel Chute

## 2012-02-08 NOTE — Telephone Encounter (Signed)
Cardiac enzymes negative. Patient should proceed with cardiac stress test at her earliest convenience. No other treatment changes recommended. Thanks

## 2012-02-08 NOTE — Telephone Encounter (Signed)
Received PA back med has been approve notified pharmacy with status...Alexis Olson

## 2012-02-08 NOTE — Telephone Encounter (Signed)
Notified pt with md response.../lmb 

## 2012-02-29 ENCOUNTER — Institutional Professional Consult (permissible substitution): Payer: Medicare Other | Admitting: Cardiovascular Disease

## 2012-03-01 ENCOUNTER — Telehealth: Payer: Self-pay | Admitting: Internal Medicine

## 2012-03-01 DIAGNOSIS — E611 Iron deficiency: Secondary | ICD-10-CM

## 2012-03-01 NOTE — Telephone Encounter (Signed)
Patient is asking if she can have iron checked when she has a CBC at the end of December. Please, advise.

## 2012-03-01 NOTE — Telephone Encounter (Signed)
Yes, it is time for her to recheck her CBC and Iron studies, last check 11/2011.

## 2012-03-04 MED ORDER — HEMOCYTE PLUS 106-1 MG PO CAPS: ORAL_CAPSULE | ORAL | Status: DC

## 2012-03-04 NOTE — Telephone Encounter (Signed)
Patient notified

## 2012-03-04 NOTE — Telephone Encounter (Signed)
Iron studies added to labs for Dec.

## 2012-03-05 ENCOUNTER — Other Ambulatory Visit: Payer: Self-pay | Admitting: Internal Medicine

## 2012-03-06 ENCOUNTER — Telehealth: Payer: Self-pay | Admitting: *Deleted

## 2012-03-06 ENCOUNTER — Other Ambulatory Visit (HOSPITAL_COMMUNITY): Payer: Medicare Other

## 2012-03-06 NOTE — Telephone Encounter (Signed)
Notified Jackie with md response.../lmb 

## 2012-03-06 NOTE — Telephone Encounter (Signed)
Pt schedule to have a Dobutamine echo @ 2:00 today, but on her EKG she has a (L) bundle typical don't do that test, best test would be a Lexi Myo view if md want change need order place...Raechel Chute

## 2012-03-06 NOTE — Telephone Encounter (Signed)
Ok to change to appropriate test as recommended

## 2012-03-07 ENCOUNTER — Ambulatory Visit (HOSPITAL_COMMUNITY): Payer: Medicare Other | Attending: Internal Medicine | Admitting: Radiology

## 2012-03-07 VITALS — BP 115/84 | Ht 65.0 in | Wt 195.0 lb

## 2012-03-07 DIAGNOSIS — R079 Chest pain, unspecified: Secondary | ICD-10-CM

## 2012-03-07 DIAGNOSIS — I10 Essential (primary) hypertension: Secondary | ICD-10-CM | POA: Insufficient documentation

## 2012-03-07 DIAGNOSIS — R0602 Shortness of breath: Secondary | ICD-10-CM

## 2012-03-07 DIAGNOSIS — R002 Palpitations: Secondary | ICD-10-CM | POA: Insufficient documentation

## 2012-03-07 DIAGNOSIS — I447 Left bundle-branch block, unspecified: Secondary | ICD-10-CM | POA: Insufficient documentation

## 2012-03-07 DIAGNOSIS — R0609 Other forms of dyspnea: Secondary | ICD-10-CM | POA: Insufficient documentation

## 2012-03-07 DIAGNOSIS — R55 Syncope and collapse: Secondary | ICD-10-CM | POA: Insufficient documentation

## 2012-03-07 DIAGNOSIS — J45909 Unspecified asthma, uncomplicated: Secondary | ICD-10-CM | POA: Insufficient documentation

## 2012-03-07 DIAGNOSIS — R0989 Other specified symptoms and signs involving the circulatory and respiratory systems: Secondary | ICD-10-CM | POA: Insufficient documentation

## 2012-03-07 DIAGNOSIS — R0789 Other chest pain: Secondary | ICD-10-CM | POA: Insufficient documentation

## 2012-03-07 MED ORDER — REGADENOSON 0.4 MG/5ML IV SOLN: 0.4000 mg | Freq: Once | INTRAVENOUS | Status: DC

## 2012-03-07 MED ORDER — ADENOSINE (DIAGNOSTIC) 3 MG/ML IV SOLN
0.5600 mg/kg | Freq: Once | INTRAVENOUS | Status: AC
  Administered 2012-03-07: 49.5 mg via INTRAVENOUS

## 2012-03-07 MED ORDER — TECHNETIUM TC 99M SESTAMIBI GENERIC - CARDIOLITE
33.0000 | Freq: Once | INTRAVENOUS | Status: AC | PRN
  Administered 2012-03-07: 33 via INTRAVENOUS

## 2012-03-07 MED ORDER — TECHNETIUM TC 99M SESTAMIBI GENERIC - CARDIOLITE
10.8000 | Freq: Once | INTRAVENOUS | Status: AC | PRN
  Administered 2012-03-07: 11 via INTRAVENOUS

## 2012-03-07 NOTE — Progress Notes (Signed)
[facility] SITE 3 NUCLEAR MED [address] [place], [place] [address] [phone]    Cardiology Nuclear Med Study  Alexis Olson is a 78 y.o. female     MRN : [id number]     DOB: October 09, 1933  Procedure Date: 03/07/2012  Nuclear Med Background Indication for Stress Test:  Evaluation for Ischemia History:  Asthma and 02' Myocardial Perfusion Study EF:52% Cardiac Risk Factors: Hypertension, LBBB and TIA  Symptoms:  Chest Pain, Chest Pressure.  (last date of chest discomfort x2 weeks prior), DOE, Fatigue, Near Syncope, Palpitations and Rapid HR   Nuclear Pre-Procedure Caffeine/Decaff Intake:  None NPO After: 8:00am   Lungs:  clear O2 Sat: 96% on room air. IV 0.9% NS with Angio Cath:  20g  IV Site: R Forearm  IV Started by:  Cathlyn Parsons, RN  Chest Size (in):  38 Cup Size: C  Height: 5\' 5"  (1.651 m)  Weight:  195 lb (88.451 kg)  BMI:  Body mass index is 32.45 kg/(m^2). Tech Comments:  n/a    Nuclear Med Study 1 or 2 day study: 1 day  Stress Test Type:  Adenosine  Reading MD: Arvilla Meres, MD  Order Authorizing Provider:  Genice Rouge  Resting Radionuclide: Technetium 55m Sestamibi  Resting Radionuclide Dose: 10.8 mCi   Stress Radionuclide:  Technetium 89m Sestamibi  Stress Radionuclide Dose: 33.0 mCi           Stress Protocol Rest HR: 85 Stress HR: 106  Rest BP: 115/84 Stress BP: 131/72  Exercise Time (min): n/a METS: n/a   Predicted Max HR: 144 bpm % Max HR: 73.61 bpm Rate Pressure Product: 21308    Dose of Adenosine (mg):  49.6 Dose of Lexiscan: n/a mg  Dose of Atropine (mg): n/a Dose of Dobutamine: n/a mcg/kg/min (at max HR)  Stress Test Technologist: Frederick Peers, EMT-P  Nuclear Technologist:  Domenic Polite, CNMT     Rest Procedure:  Myocardial perfusion imaging was performed at rest 45 minutes following the intravenous administration of Technetium 9m Sestamibi. Rest ECG: NSR-LBBB  Stress Procedure:  The patient received IV  adenosine at 140 mcg/kg/min for 4 minutes.  Technetium 61m Sestamibi was injected at the 2 minute mark and quantitative spect images were obtained after a 45 minute delay. Stress ECG: No significant change from baseline ECG  QPS Raw Data Images:  Patient motion noted. Stress Images:  Normal homogeneous uptake in all areas of the myocardium. Rest Images:  Normal homogeneous uptake in all areas of the myocardium. Subtraction (SDS):  Normal Transient Ischemic Dilatation (Normal <1.22):  1.17 Lung/Heart Ratio (Normal <0.45):  0.30  Quantitative Gated Spect Images QGS EDV:  82 ml QGS ESV:  34 ml  Impression Exercise Capacity:  Adenosine study with no exercise. BP Response:  Normal blood pressure response. Clinical Symptoms:  Atypical chest pain. ECG Impression:  No significant ST segment change suggestive of ischemia. Comparison with Prior Nuclear Study: No images to compare  Overall Impression:  Normal stress nuclear study.  LV Ejection Fraction: 59%.  LV Wall Motion:  NL LV Function; NL Wall Motion  Charlton Haws

## 2012-03-08 ENCOUNTER — Other Ambulatory Visit (HOSPITAL_COMMUNITY): Payer: Medicare Other

## 2012-03-11 ENCOUNTER — Telehealth: Payer: Self-pay | Admitting: Internal Medicine

## 2012-03-11 NOTE — Telephone Encounter (Signed)
Spoke with patient and notified her she is due for labs end of Dec.  Lab order is in computer.

## 2012-03-12 ENCOUNTER — Other Ambulatory Visit (INDEPENDENT_AMBULATORY_CARE_PROVIDER_SITE_OTHER): Payer: Medicare Other

## 2012-03-12 DIAGNOSIS — E611 Iron deficiency: Secondary | ICD-10-CM

## 2012-03-12 DIAGNOSIS — D509 Iron deficiency anemia, unspecified: Secondary | ICD-10-CM

## 2012-03-12 DIAGNOSIS — D649 Anemia, unspecified: Secondary | ICD-10-CM

## 2012-03-12 LAB — IBC PANEL
Iron: 35 ug/dL — ABNORMAL LOW (ref 42–145)
Saturation Ratios: 13.3 % — ABNORMAL LOW (ref 20.0–50.0)
Transferrin: 187.5 mg/dL — ABNORMAL LOW (ref 212.0–360.0)

## 2012-03-12 LAB — CBC WITH DIFFERENTIAL/PLATELET
Basophils Absolute: 0 10*3/uL (ref 0.0–0.1)
Basophils Relative: 0.1 % (ref 0.0–3.0)
Eosinophils Absolute: 0.3 10*3/uL (ref 0.0–0.7)
Eosinophils Relative: 3.2 % (ref 0.0–5.0)
HCT: 31.3 % — ABNORMAL LOW (ref 36.0–46.0)
Hemoglobin: 10.5 g/dL — ABNORMAL LOW (ref 12.0–15.0)
Lymphocytes Relative: 27.5 % (ref 12.0–46.0)
Lymphs Abs: 2.3 10*3/uL (ref 0.7–4.0)
MCHC: 33.4 g/dL (ref 30.0–36.0)
MCV: 94.8 fl (ref 78.0–100.0)
Monocytes Absolute: 0.6 10*3/uL (ref 0.1–1.0)
Monocytes Relative: 7.4 % (ref 3.0–12.0)
Neutro Abs: 5.2 10*3/uL (ref 1.4–7.7)
Neutrophils Relative %: 61.8 % (ref 43.0–77.0)
Platelets: 279 10*3/uL (ref 150.0–400.0)
RBC: 3.31 Mil/uL — ABNORMAL LOW (ref 3.87–5.11)
RDW: 13.9 % (ref 11.5–14.6)
WBC: 8.4 10*3/uL (ref 4.5–10.5)

## 2012-03-13 ENCOUNTER — Other Ambulatory Visit: Payer: Self-pay | Admitting: *Deleted

## 2012-03-13 ENCOUNTER — Other Ambulatory Visit: Payer: Self-pay | Admitting: Internal Medicine

## 2012-03-15 ENCOUNTER — Other Ambulatory Visit (HOSPITAL_COMMUNITY): Payer: Self-pay | Admitting: Internal Medicine

## 2012-03-15 ENCOUNTER — Other Ambulatory Visit: Payer: Self-pay | Admitting: *Deleted

## 2012-03-18 ENCOUNTER — Encounter (HOSPITAL_COMMUNITY): Payer: Self-pay

## 2012-03-18 ENCOUNTER — Encounter (HOSPITAL_COMMUNITY)
Admission: RE | Admit: 2012-03-18 | Discharge: 2012-03-18 | Disposition: A | Discharge status: Home / Self Care | Payer: Medicare Other | Source: Ambulatory Visit | Attending: Internal Medicine | Admitting: Internal Medicine

## 2012-03-18 VITALS — BP 123/75 | HR 75 | Temp 97.8°F | Resp 18 | Ht 65.0 in | Wt 195.0 lb

## 2012-03-18 DIAGNOSIS — D509 Iron deficiency anemia, unspecified: Secondary | ICD-10-CM | POA: Insufficient documentation

## 2012-03-18 DIAGNOSIS — D649 Anemia, unspecified: Secondary | ICD-10-CM

## 2012-03-18 MED ORDER — SODIUM CHLORIDE 0.9 % IV SOLN
INTRAVENOUS | Status: DC
  Administered 2012-03-18: 09:00:00 via INTRAVENOUS

## 2012-03-18 MED ORDER — SODIUM CHLORIDE 0.9 % IV SOLN
25.0000 mg | Freq: Once | INTRAVENOUS | Status: AC
  Administered 2012-03-18: 25 mg via INTRAVENOUS
  Filled 2012-03-18: qty 0.5

## 2012-03-18 MED ORDER — SODIUM CHLORIDE 0.9 % IV SOLN
1400.0000 mg | Freq: Once | INTRAVENOUS | Status: AC
  Administered 2012-03-18: 1400 mg via INTRAVENOUS
  Filled 2012-03-18: qty 28

## 2012-03-18 NOTE — Progress Notes (Signed)
MEDICATION RELATED CONSULT NOTE - INITIAL   Pharmacy Consult for Iron Dextran Indication: Chronic anemia 2/2 malabsorption related to bypass hx, B12 and iron deficiency  Allergies  Allergen Reactions  . Azithromycin     REACTION: unspecified  . Ciprofloxacin Hcl     REACTION: causes yeast inf  and refuses to take  . Codeine Phosphate     REACTION: unspecified  . Hydrocodone   . Hydrocodone-Homatropine     REACTION: unspecified  . Levaquin (Levofloxacin In D5w) Other (See Comments)    Muscle soreness  . Meperidine Hcl   . Ultram (Tramadol Hcl)     Patient Measurements: Height: 5\' 5"  (165.1 cm) Weight: 195 lb (88.451 kg) IBW/kg (Calculated) : 57  Adjusted Body Weight:   Vital Signs: Temp: 97.9 F (36.6 C) (12/23 0830) Temp src: Oral (12/23 0830) BP: 129/80 mmHg (12/23 0830) Pulse Rate: 82  (12/23 0830) Intake/Output from previous day:   Intake/Output from this shift:    Labs: No results found for this basename: WBC:3,HGB:3,HCT:3,PLT:3,APTT:3;INR:3,CREATININE:3,LABCREA:3,CREATININE:3,CREAT24HRUR:3,MG:3,PHOS:3,ALBUMIN:3,PROT:3,ALBUMIN:3,AST:3,ALT:3,ALKPHOS:3,BILITOT:3,BILIDIR:3,IBILI:3 in the last 72 hours Estimated Creatinine Clearance: 65.7 ml/min (by C-G formula based on Cr of 0.8).   Microbiology: No results found for this or any previous visit (from the past 720 hour(s)).  Medical History: Past Medical History  Diagnosis Date  . Obesity   . Lumbosacral disc disease     Chronic pain; History of osteomyelitis 2010 following ESI complication  . ALLERGIC RHINITIS   . 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 ANXIETY   . ASTHMA   . Bariatric surgery status   . COLONIC POLYPS, ADENOMATOUS, HX OF   . DIVERTICULOSIS, COLON   . INSOMNIA   . URINARY URGENCY   . VITAMIN B12 DEFICIENCY   . Hypotension     Medications:  Vitamin B12 1000 mcg IM q 30 days Hemocyte Plus po BID  Assessment: 78 yoF to receive one dose of  Iron Dextran for anemia 2/2 iron and vitamin B12 deficiency and malabsorption related to bypass hx.    Pertinent labs 03/12/2012: Iron: 35 (goal 60-150) Saturation Ratio: 13.3 (goal 20-50) Transferrin:  187.5 Hgb/Hct: 10.5/31.3  Using iron dextran calculation:  Dose (mL) =27.41ml Dose (mg) =1384mg , rounded to nearest 100mg  -->1400mg    Goal of Therapy:  Desired Hgb 14.8 g/dl   Plan:  1.  Infed 25mg  IV x 1 over 5 minutes.  Monitor for reaction.  2.  If no reaction with test dose after 1 hour, Infed 1400mg  IV x 1 over 4-6 hours 3.  Continue to monitor for any type of reaction (anaphylaxis/hypersensitivity).  4.  F/u Hgb/Hct, retic count, serum ferritin, serum iron, TIBC  Summe, Colleen E 03/18/2012,8:45 AM

## 2012-03-18 NOTE — Progress Notes (Signed)
Patient has had test dose of Infed and had no reactions. Patient denied SOB, itching, pain and dizziness.

## 2012-03-21 ENCOUNTER — Encounter (HOSPITAL_COMMUNITY): Payer: Medicare Other

## 2012-03-26 ENCOUNTER — Encounter: Payer: Self-pay | Admitting: Internal Medicine

## 2012-03-26 ENCOUNTER — Ambulatory Visit (INDEPENDENT_AMBULATORY_CARE_PROVIDER_SITE_OTHER): Payer: Medicare Other | Admitting: Internal Medicine

## 2012-03-26 VITALS — BP 112/62 | HR 94 | Temp 97.4°F | Wt 197.4 lb

## 2012-03-26 DIAGNOSIS — J019 Acute sinusitis, unspecified: Secondary | ICD-10-CM

## 2012-03-26 MED ORDER — DOXYCYCLINE HYCLATE 100 MG PO TABS: 100.0000 mg | ORAL_TABLET | Freq: Two times a day (BID) | ORAL | Status: DC

## 2012-03-26 NOTE — Progress Notes (Signed)
HPI  Pt presents to the clinic today with a 10 day history of headache, low grade fever, sinus pain and pressure and nasal congestion. She has a problem with allergies and does get frequent sinus infections. She has been taking her allergy meds without any relief. She does feel like the symptoms are getting worse every day.  Review of Systems    Past Medical History  Diagnosis Date  . Obesity   . Lumbosacral disc disease     Chronic pain; History of osteomyelitis 2010 following ESI complication  . ALLERGIC RHINITIS   . 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 ANXIETY   . ASTHMA   . Bariatric surgery status   . COLONIC POLYPS, ADENOMATOUS, HX OF   . DIVERTICULOSIS, COLON   . INSOMNIA   . URINARY URGENCY   . VITAMIN B12 DEFICIENCY   . Hypotension     Family History  Problem Relation Age of Onset  . Cervical cancer Mother   . Liver disease Sister   . Kidney disease Sister   . Cirrhosis Sister   . Colon cancer Neg Hx     History   Social History  . Marital Status: Married    Spouse Name: N/A    Number of Children: 3  . Years of Education: N/A   Occupational History  . retired Charity fundraiser    Social History Main Topics  . Smoking status: Never Smoker   . Smokeless tobacco: Never Used  . Alcohol Use: No  . Drug Use: No  . Sexually Active: Not on file   Other Topics Concern  . Not on file   Social History Narrative  . No narrative on file    Allergies  Allergen Reactions  . Azithromycin     REACTION: unspecified  . Ciprofloxacin Hcl     REACTION: causes yeast inf  and refuses to take  . Codeine Phosphate     REACTION: unspecified  . Hydrocodone   . Hydrocodone-Homatropine     REACTION: unspecified  . Levaquin (Levofloxacin In D5w) Other (See Comments)    Muscle soreness  . Meperidine Hcl   . Ultram (Tramadol Hcl)      Constitutional: Positive headache, fatigue and fever. Denies abrupt weight changes.  HEENT:   Positive eye pain, pressure behind the eyes, facial pain, nasal congestion and sore throat. Denies eye redness, ear pain, ringing in the ears, wax buildup, runny nose or bloody nose. Respiratory: Positive cough. Denies difficulty breathing or shortness of breath.  Cardiovascular: Denies chest pain, chest tightness, palpitations or swelling in the hands or feet.   No other specific complaints in a complete review of systems (except as listed in HPI above).  Objective:    General: Appears her stated age, well developed, well nourished in NAD. HEENT: Head: normal shape and size; Eyes: sclera white, no icterus, conjunctiva pink, PERRLA and EOMs intact; Ears: Tm's gray and intact, normal light reflex; Nose: mucosa pink and moist, septum midline; Throat/Mouth: + PND. Teeth present, mucosa pink and moist, no exudate noted, no lesions or ulcerations noted.  Neck: Mild cervical lymphadenopathy. Neck supple, trachea midline. No massses, lumps or thyromegaly present.  Cardiovascular: Normal rate and rhythm. S1,S2 noted.  No murmur, rubs or gallops noted. No JVD or BLE edema. No carotid bruits noted. Pulmonary/Chest: Normal effort and positive vesicular breath sounds. No respiratory distress. No wheezes, rales or ronchi noted.      Assessment &  Plan:   Acute bacterial sinusitis  Can use a Neti Pot which can be purchased from your local drug store. Flonase 2 sprays each nostril for 3 days and then as needed. Doxycycline BID for 10 days (pt states she is unable to take Augmentin although it is not on her allergy list)  RTC as needed or if symptoms persist.

## 2012-03-26 NOTE — Patient Instructions (Addendum)

## 2012-04-01 ENCOUNTER — Other Ambulatory Visit: Payer: Self-pay | Admitting: *Deleted

## 2012-04-01 MED ORDER — DIPHENOXYLATE-ATROPINE 2.5-0.025 MG PO TABS: 1.0000 | ORAL_TABLET | Freq: Every day | ORAL | Status: DC | PRN

## 2012-04-08 ENCOUNTER — Telehealth: Payer: Self-pay | Admitting: *Deleted

## 2012-04-08 NOTE — Telephone Encounter (Signed)
error 

## 2012-04-16 ENCOUNTER — Other Ambulatory Visit (INDEPENDENT_AMBULATORY_CARE_PROVIDER_SITE_OTHER): Payer: Medicare Other

## 2012-04-16 DIAGNOSIS — E538 Deficiency of other specified B group vitamins: Secondary | ICD-10-CM

## 2012-04-16 DIAGNOSIS — D649 Anemia, unspecified: Secondary | ICD-10-CM

## 2012-04-16 LAB — CBC WITH DIFFERENTIAL/PLATELET
Basophils Absolute: 0 10*3/uL (ref 0.0–0.1)
Basophils Relative: 0.5 % (ref 0.0–3.0)
Eosinophils Absolute: 0.2 10*3/uL (ref 0.0–0.7)
Eosinophils Relative: 2.3 % (ref 0.0–5.0)
HCT: 36 % (ref 36.0–46.0)
Hemoglobin: 12 g/dL (ref 12.0–15.0)
Lymphocytes Relative: 41.3 % (ref 12.0–46.0)
Lymphs Abs: 3.2 10*3/uL (ref 0.7–4.0)
MCHC: 33.2 g/dL (ref 30.0–36.0)
MCV: 95.1 fl (ref 78.0–100.0)
Monocytes Absolute: 0.8 10*3/uL (ref 0.1–1.0)
Monocytes Relative: 10.3 % (ref 3.0–12.0)
Neutro Abs: 3.5 10*3/uL (ref 1.4–7.7)
Neutrophils Relative %: 45.6 % (ref 43.0–77.0)
Platelets: 299 10*3/uL (ref 150.0–400.0)
RBC: 3.79 Mil/uL — ABNORMAL LOW (ref 3.87–5.11)
RDW: 13.7 % (ref 11.5–14.6)
WBC: 7.8 10*3/uL (ref 4.5–10.5)

## 2012-04-17 LAB — VITAMIN B12: Vitamin B-12: 448 pg/mL (ref 211–911)

## 2012-04-18 ENCOUNTER — Telehealth: Payer: Self-pay | Admitting: Internal Medicine

## 2012-04-18 ENCOUNTER — Other Ambulatory Visit: Payer: Self-pay | Admitting: *Deleted

## 2012-04-18 DIAGNOSIS — E538 Deficiency of other specified B group vitamins: Secondary | ICD-10-CM

## 2012-04-18 DIAGNOSIS — D649 Anemia, unspecified: Secondary | ICD-10-CM

## 2012-04-18 NOTE — Telephone Encounter (Signed)
See result note on 04/16/12

## 2012-05-06 ENCOUNTER — Other Ambulatory Visit: Payer: Self-pay | Admitting: *Deleted

## 2012-05-06 MED ORDER — ZOLPIDEM TARTRATE 10 MG PO TABS: 10.0000 mg | ORAL_TABLET | Freq: Every evening | ORAL | Status: DC | PRN

## 2012-05-06 NOTE — Telephone Encounter (Signed)
[facility]...lmb

## 2012-05-15 ENCOUNTER — Other Ambulatory Visit: Payer: Self-pay | Admitting: Internal Medicine

## 2012-05-16 ENCOUNTER — Telehealth: Payer: Self-pay | Admitting: Internal Medicine

## 2012-05-16 NOTE — Telephone Encounter (Signed)
Patient needs to switch her medications to [facility] and she would need to come by and pick up written scripts to send in for all of her medications

## 2012-05-20 ENCOUNTER — Telehealth: Payer: Self-pay | Admitting: Internal Medicine

## 2012-05-20 MED ORDER — POTASSIUM CHLORIDE CRYS ER 20 MEQ PO TBCR: EXTENDED_RELEASE_TABLET | ORAL | Status: DC

## 2012-05-20 MED ORDER — AMITRIPTYLINE HCL 100 MG PO TABS: ORAL_TABLET | ORAL | Status: DC

## 2012-05-20 MED ORDER — CYCLOBENZAPRINE HCL 10 MG PO TABS: 10.0000 mg | ORAL_TABLET | Freq: Three times a day (TID) | ORAL | Status: DC | PRN

## 2012-05-20 MED ORDER — LEVOCETIRIZINE DIHYDROCHLORIDE 5 MG PO TABS: 5.0000 mg | ORAL_TABLET | Freq: Every evening | ORAL | Status: DC

## 2012-05-20 MED ORDER — OMEPRAZOLE 20 MG PO CPDR: DELAYED_RELEASE_CAPSULE | ORAL | Status: DC

## 2012-05-20 MED ORDER — PREGABALIN 50 MG PO CAPS: ORAL_CAPSULE | ORAL | Status: DC

## 2012-05-20 NOTE — Telephone Encounter (Signed)
Called pt back to verify msg.m pt states she is now using [facility] and needing written rx to mail to them. Inform pt will leave up front for her to pick up...Raechel Chute

## 2012-05-20 NOTE — Telephone Encounter (Signed)
The patient called hoping to get prescriptions for the generic medications for her mail order pharmacy.    Pt's call back - [phone]

## 2012-06-05 ENCOUNTER — Other Ambulatory Visit: Payer: Self-pay | Admitting: Internal Medicine

## 2012-06-11 ENCOUNTER — Telehealth: Payer: Self-pay | Admitting: *Deleted

## 2012-06-11 DIAGNOSIS — R5381 Other malaise: Secondary | ICD-10-CM

## 2012-06-11 DIAGNOSIS — Z9884 Bariatric surgery status: Secondary | ICD-10-CM

## 2012-06-11 NOTE — Telephone Encounter (Signed)
Pt is having B12 and CBC lab work with Dr. Juanda Chance soon and she wants lab orders added to check blood sugar and triglycerides so that she can have all lab work done at same time.

## 2012-06-11 NOTE — Telephone Encounter (Signed)
Ash, Ok to add future orders for BMET and lipid profile

## 2012-06-11 NOTE — Telephone Encounter (Signed)
Pt informed orders placed for BMET and Lipid Profile.

## 2012-06-13 ENCOUNTER — Telehealth: Payer: Self-pay | Admitting: *Deleted

## 2012-06-13 NOTE — Telephone Encounter (Signed)
Message copied by Daphine Deutscher on Thu Jun 13, 2012  8:29 AM ------      Message from: Daphine Deutscher      Created: Thu Dec 21, 2011 10:48 AM       Call and remind CBC due on 3/24/14DB ------

## 2012-07-10 ENCOUNTER — Other Ambulatory Visit: Payer: Self-pay | Admitting: Internal Medicine

## 2012-07-11 NOTE — Telephone Encounter (Signed)
Medication not on med list. Is this ok to refill?...lmb

## 2012-07-12 ENCOUNTER — Telehealth: Payer: Self-pay | Admitting: Internal Medicine

## 2012-07-12 MED ORDER — NABUMETONE 750 MG PO TABS: 750.0000 mg | ORAL_TABLET | Freq: Two times a day (BID) | ORAL | Status: DC

## 2012-07-12 NOTE — Telephone Encounter (Signed)
Rx resent to [facility]. Pt informed.

## 2012-07-12 NOTE — Telephone Encounter (Signed)
Pt inquiring about her refill of nabumetone (RELAFEN) 750 MG tablet. States that her pharmacy still doesn't have it. Please assist.

## 2012-07-15 ENCOUNTER — Telehealth: Payer: Self-pay | Admitting: Internal Medicine

## 2012-07-15 ENCOUNTER — Encounter: Payer: Self-pay | Admitting: *Deleted

## 2012-07-15 ENCOUNTER — Other Ambulatory Visit: Payer: Self-pay | Admitting: Internal Medicine

## 2012-07-15 NOTE — Telephone Encounter (Signed)
Patient is coming in later this week to have labs drawn and she would like to have her sugar checked at the same time if that could be added to the orders, call patient with questions

## 2012-07-16 ENCOUNTER — Other Ambulatory Visit (INDEPENDENT_AMBULATORY_CARE_PROVIDER_SITE_OTHER): Payer: Medicare Other

## 2012-07-16 ENCOUNTER — Ambulatory Visit (INDEPENDENT_AMBULATORY_CARE_PROVIDER_SITE_OTHER): Payer: Medicare Other

## 2012-07-16 ENCOUNTER — Telehealth: Payer: Self-pay | Admitting: *Deleted

## 2012-07-16 DIAGNOSIS — R5383 Other fatigue: Secondary | ICD-10-CM

## 2012-07-16 DIAGNOSIS — Z9884 Bariatric surgery status: Secondary | ICD-10-CM

## 2012-07-16 DIAGNOSIS — E538 Deficiency of other specified B group vitamins: Secondary | ICD-10-CM

## 2012-07-16 DIAGNOSIS — R5381 Other malaise: Secondary | ICD-10-CM

## 2012-07-16 DIAGNOSIS — D649 Anemia, unspecified: Secondary | ICD-10-CM

## 2012-07-16 LAB — LIPID PANEL
Cholesterol: 163 mg/dL (ref 0–200)
HDL: 47.5 mg/dL (ref >39.00)
Total CHOL/HDL Ratio: 3
Triglycerides: 234 mg/dL — ABNORMAL HIGH (ref 0.0–149.0)
VLDL: 46.8 mg/dL — ABNORMAL HIGH (ref 0.0–40.0)

## 2012-07-16 LAB — BASIC METABOLIC PANEL
BUN: 15 mg/dL (ref 6–23)
CO2: 27 mEq/L (ref 19–32)
Calcium: 8.5 mg/dL (ref 8.4–10.5)
Chloride: 94 mEq/L — ABNORMAL LOW (ref 96–112)
Creatinine, Ser: 0.6 mg/dL (ref 0.4–1.2)
GFR: 104.99 mL/min (ref >60.00)
Glucose, Bld: 95 mg/dL (ref 70–99)
Potassium: 3.8 mEq/L (ref 3.5–5.1)
Sodium: 128 mEq/L — ABNORMAL LOW (ref 135–145)

## 2012-07-16 LAB — CBC WITH DIFFERENTIAL/PLATELET
Basophils Absolute: 0 10*3/uL (ref 0.0–0.1)
Basophils Relative: 0.3 % (ref 0.0–3.0)
Eosinophils Absolute: 0.2 10*3/uL (ref 0.0–0.7)
Eosinophils Relative: 2 % (ref 0.0–5.0)
HCT: 34.2 % — ABNORMAL LOW (ref 36.0–46.0)
Hemoglobin: 11.5 g/dL — ABNORMAL LOW (ref 12.0–15.0)
Lymphocytes Relative: 32.1 % (ref 12.0–46.0)
Lymphs Abs: 2.7 10*3/uL (ref 0.7–4.0)
MCHC: 33.6 g/dL (ref 30.0–36.0)
MCV: 95.6 fl (ref 78.0–100.0)
Monocytes Absolute: 0.7 10*3/uL (ref 0.1–1.0)
Monocytes Relative: 8.4 % (ref 3.0–12.0)
Neutro Abs: 4.9 10*3/uL (ref 1.4–7.7)
Neutrophils Relative %: 57.2 % (ref 43.0–77.0)
Platelets: 237 10*3/uL (ref 150.0–400.0)
RBC: 3.57 Mil/uL — ABNORMAL LOW (ref 3.87–5.11)
RDW: 13.6 % (ref 11.5–14.6)
WBC: 8.5 10*3/uL (ref 4.5–10.5)

## 2012-07-16 LAB — IBC PANEL
Iron: 72 ug/dL (ref 42–145)
Saturation Ratios: 27.3 % (ref 20.0–50.0)
Transferrin: 188.2 mg/dL — ABNORMAL LOW (ref 212.0–360.0)

## 2012-07-16 NOTE — Telephone Encounter (Signed)
Message copied by Daphine Deutscher on Tue Jul 16, 2012  9:56 AM ------      Message from: Daphine Deutscher      Created: Thu Jun 13, 2012  8:31 AM       Call and remind CBC due for DB ------

## 2012-07-16 NOTE — Telephone Encounter (Signed)
Glucose level is part of Bmet - thanks

## 2012-07-16 NOTE — Telephone Encounter (Signed)
Message copied by Daphine Deutscher on Tue Jul 16, 2012  9:51 AM ------      Message from: Daphine Deutscher      Created: Thu Apr 18, 2012  9:12 AM       Call and remind patient due for CBC, iron panel on 07/15/12 for DB ------

## 2012-07-16 NOTE — Telephone Encounter (Signed)
Left a message for patient to call me. 

## 2012-07-17 ENCOUNTER — Other Ambulatory Visit: Payer: Self-pay | Admitting: *Deleted

## 2012-07-17 DIAGNOSIS — D509 Iron deficiency anemia, unspecified: Secondary | ICD-10-CM

## 2012-07-17 LAB — LDL CHOLESTEROL, DIRECT: Direct LDL: 81.1 mg/dL

## 2012-07-25 ENCOUNTER — Other Ambulatory Visit: Payer: Self-pay | Admitting: Internal Medicine

## 2012-07-26 ENCOUNTER — Ambulatory Visit (INDEPENDENT_AMBULATORY_CARE_PROVIDER_SITE_OTHER): Payer: Medicare Other | Admitting: Internal Medicine

## 2012-07-26 ENCOUNTER — Encounter: Payer: Self-pay | Admitting: Internal Medicine

## 2012-07-26 VITALS — BP 110/72 | HR 77 | Temp 97.0°F | Wt 200.2 lb

## 2012-07-26 DIAGNOSIS — F411 Generalized anxiety disorder: Secondary | ICD-10-CM

## 2012-07-26 DIAGNOSIS — M5380 Other specified dorsopathies, site unspecified: Secondary | ICD-10-CM

## 2012-07-26 DIAGNOSIS — M5137 Other intervertebral disc degeneration, lumbosacral region: Secondary | ICD-10-CM

## 2012-07-26 DIAGNOSIS — M62830 Muscle spasm of back: Secondary | ICD-10-CM

## 2012-07-26 MED ORDER — ACETAMINOPHEN-CODEINE #2 300-15 MG PO TABS: 1.0000 | ORAL_TABLET | ORAL | Status: DC | PRN

## 2012-07-26 MED ORDER — CYANOCOBALAMIN 1000 MCG/ML IJ SOLN: 1000.0000 ug | INTRAMUSCULAR | Status: DC

## 2012-07-26 MED ORDER — METHOCARBAMOL 500 MG PO TABS: 500.0000 mg | ORAL_TABLET | Freq: Three times a day (TID) | ORAL | Status: DC | PRN

## 2012-07-26 MED ORDER — PHENAZOPYRIDINE HCL 200 MG PO TABS: 200.0000 mg | ORAL_TABLET | Freq: Three times a day (TID) | ORAL | Status: DC | PRN

## 2012-07-26 MED ORDER — ALPRAZOLAM 1 MG PO TABS: 1.0000 mg | ORAL_TABLET | Freq: Every evening | ORAL | Status: DC | PRN

## 2012-07-26 NOTE — Patient Instructions (Signed)
It was good to see you today. We have reviewed your prior records including labs and tests today Try low dose Tylenol with codeine #2 -  Also change muscle relaxer from flexeril to Robaxin Your prescription(s) have been submitted to your pharmacy. Please take as directed and contact our office if you believe you are having problem(s) with the medication(s). Other Medications reviewed and updated, no changes recommended at this time.  Refill on medication(s) as discussed today. If continued pain, call for refer to Dr Casimer Bilis as discussed

## 2012-07-26 NOTE — Progress Notes (Signed)
  Subjective:    Patient ID: Alexis Olson, female    DOB: 1933/10/31, 79 y.o.   MRN: [id number]  HPI  Here for followup -Reviewed interval medical events and history  Also see chief complaint above - pain spasm currently improved Requests refills  Past Medical History  Diagnosis Date  . Obesity   . Lumbosacral disc disease     Chronic pain; History of osteomyelitis 2010 following ESI complication  . ALLERGIC RHINITIS   . 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 ANXIETY   . ASTHMA   . Bariatric surgery status   . COLONIC POLYPS, ADENOMATOUS, HX OF   . DIVERTICULOSIS, COLON   . INSOMNIA   . URINARY URGENCY   . VITAMIN B12 DEFICIENCY   . Hypotension     Review of Systems  Constitutional: Positive for fatigue. Negative for fever.  Respiratory: Negative for cough and shortness of breath.   Cardiovascular: Negative for chest pain and leg swelling.  Musculoskeletal: Positive for back pain. Negative for joint swelling.       Objective:   Physical Exam BP 110/72  Pulse 77  Temp(Src) 97 F (36.1 C) (Oral)  Wt 200 lb 3.2 oz (90.81 kg)  BMI 33.31 kg/m2  SpO2 98% Wt Readings from Last 3 Encounters:  07/26/12 200 lb 3.2 oz (90.81 kg)  03/26/12 197 lb 6.4 oz (89.54 kg)  03/18/12 195 lb (88.451 kg)   Constitutional: She is overweight, but appears well-developed and well-nourished. No distress.  Cardiovascular: Normal rate, regular rhythm and normal heart sounds.  No murmur heard. No BLE edema. Pulmonary/Chest: Effort normal and breath sounds normal. No respiratory distress. She has no wheezes.  Abdominal: Soft. Bowel sounds are normal. She exhibits no distension. There is no tenderness. no masses Musculoskeletal: Back: full range of motion of thoracic and lumbar spine. Non tender to palpation to vertebra - spasm below R scapula. Negative straight leg raise. DTR's are symmetrically intact. Sensation intact in all dermatomes of the lower  extremities. Full strength to manual muscle testing. patient is able to heel toe walk without difficulty and ambulates with antalgic gait. Psychiatric: She has a slightly anxious mood and affect. Her behavior is normal. Judgment and thought content normal.   Lab Results  Component Value Date   WBC 8.5 07/16/2012   HGB 11.5* 07/16/2012   HCT 34.2* 07/16/2012   PLT 237.0 07/16/2012   GLUCOSE 95 07/16/2012   CHOL 163 07/16/2012   TRIG 234.0* 07/16/2012   HDL 47.50 07/16/2012   LDLDIRECT 81.1 07/16/2012   LDLCALC 92 12/27/2010   ALT 15 09/14/2011   AST 21 09/14/2011   NA 128* 07/16/2012   K 3.8 07/16/2012   CL 94* 07/16/2012   CREATININE 0.6 07/16/2012   BUN 15 07/16/2012   CO2 27 07/16/2012   TSH 0.83 08/16/2011   INR 1.01 05/13/2009   HGBA1C 5.3 08/03/2006       Assessment & Plan:   See problem list. Medications and labs reviewed today.

## 2012-07-26 NOTE — Telephone Encounter (Signed)
Lyrica has been called to the pharmacy

## 2012-07-26 NOTE — Assessment & Plan Note (Signed)
Chronic low back pain, has followed with PMR and prior NSurg for same History of epidural steroid injection complicated with osteomyelitis in 2010 -treated with 10 weeks antibiotics and resolved Uses Lyrica to help modulate pain symptoms as intolerant of many other narcotics, tramadol and NSAIDs Wishes to try low dose codeine again - Tylenol #2 rx'd Also try alternate muscle relaxer - robaxin in place of flexeril

## 2012-07-26 NOTE — Assessment & Plan Note (Signed)
Long history of same, complicated with panic attacks Uses Xanax as needed - refill today

## 2012-07-29 ENCOUNTER — Telehealth: Payer: Self-pay | Admitting: *Deleted

## 2012-07-29 DIAGNOSIS — E871 Hypo-osmolality and hyponatremia: Secondary | ICD-10-CM

## 2012-07-29 NOTE — Telephone Encounter (Signed)
I am confused because this was discussed -- I have entered an order to recheck Bmet inn 2 weeks (week of 5/19) to monitor Patient was encouraged to liberalize salt in her diet, should continue same between now and lab recheck Patient to call if problems between now and recheck. Thanks

## 2012-07-29 NOTE — Telephone Encounter (Signed)
Left message on machine for pt to return my call  

## 2012-07-29 NOTE — Telephone Encounter (Signed)
Per labs resulted 04/22, pt was advised to make OV to discuss low sodium but she says this was not done at recent OV 05/02, please advise

## 2012-07-29 NOTE — Telephone Encounter (Signed)
Pt requesting callback regarding last lab results concerning low sodium.

## 2012-07-30 NOTE — Telephone Encounter (Signed)
Pt informed of MD's recommendations and labs via personal VM. Also advised to call back with any further question or concerns, until then, office will contact her will results of repeat labs that were ordered.

## 2012-08-02 ENCOUNTER — Telehealth: Payer: Self-pay | Admitting: Internal Medicine

## 2012-08-02 MED ORDER — AMITRIPTYLINE HCL 100 MG PO TABS: ORAL_TABLET | ORAL | Status: DC

## 2012-08-02 NOTE — Telephone Encounter (Signed)
[facility]../lmb 

## 2012-08-02 NOTE — Telephone Encounter (Signed)
Pt [facility] [address] [facility] 100mg .Please call pt

## 2012-08-12 ENCOUNTER — Telehealth: Payer: Self-pay | Admitting: *Deleted

## 2012-08-12 NOTE — Telephone Encounter (Signed)
Left msg on vm stating drop off PA form for her amitriptyline on Friday. Checking status...lmb

## 2012-08-12 NOTE — Telephone Encounter (Signed)
Form completed - placed on Lucy's desk

## 2012-08-13 ENCOUNTER — Other Ambulatory Visit (INDEPENDENT_AMBULATORY_CARE_PROVIDER_SITE_OTHER): Payer: Medicare Other

## 2012-08-13 DIAGNOSIS — E871 Hypo-osmolality and hyponatremia: Secondary | ICD-10-CM

## 2012-08-13 LAB — BASIC METABOLIC PANEL
BUN: 17 mg/dL (ref 6–23)
CO2: 27 mEq/L (ref 19–32)
Calcium: 8.6 mg/dL (ref 8.4–10.5)
Chloride: 99 mEq/L (ref 96–112)
Creatinine, Ser: 0.7 mg/dL (ref 0.4–1.2)
GFR: 87.62 mL/min (ref >60.00)
Glucose, Bld: 102 mg/dL — ABNORMAL HIGH (ref 70–99)
Potassium: 3.8 mEq/L (ref 3.5–5.1)
Sodium: 133 mEq/L — ABNORMAL LOW (ref 135–145)

## 2012-08-13 NOTE — Telephone Encounter (Signed)
Faxed PA back to insurance waiting on approval status. Called pt no answer LMOM update info on PA...lmb

## 2012-08-14 ENCOUNTER — Telehealth: Payer: Self-pay | Admitting: Internal Medicine

## 2012-08-14 ENCOUNTER — Telehealth: Payer: Self-pay

## 2012-08-14 NOTE — Telephone Encounter (Signed)
Noted.. See previous msg pharmacy & pt is aware...lmb

## 2012-08-14 NOTE — Telephone Encounter (Signed)
Caller: [name]/Patient; PCP: Rene Paci (Adults only); CB#: [phone]; Call regarding Approval Forms for Amitriptyline; says she spoke to insurance co [phone] and was told they would approve the med if authorized by the MD; checked epic and told her that the notes state that the Amitriptyline has been approved and that [facility]; informed her that [facility] so she should be able to get the med when needed; said she would confirm with insurance co and call back if necessary

## 2012-08-14 NOTE — Telephone Encounter (Signed)
Phone call from [facility] stating Amitriptyline has been approved effective 08/13/12 for 1 year. Pt has been notified and a letter is to be mailed to the office.

## 2012-08-14 NOTE — Telephone Encounter (Signed)
Received call from insurance amitriptyline has been approve. Notified [facility] with [facility] status...lmb

## 2012-08-16 ENCOUNTER — Other Ambulatory Visit: Payer: Self-pay | Admitting: Internal Medicine

## 2012-08-19 ENCOUNTER — Other Ambulatory Visit: Payer: Self-pay | Admitting: Internal Medicine

## 2012-08-21 ENCOUNTER — Telehealth: Payer: Self-pay | Admitting: *Deleted

## 2012-08-21 NOTE — Telephone Encounter (Signed)
Pt states she is needing PA on her pyridium. Need md to call [phone]. Member ID [id number]. Inform pt once we received PA & get it approve will call back & let her know status...lmb

## 2012-08-22 NOTE — Telephone Encounter (Signed)
Called insurance spoke with [name] gave pt info he states that the drug is unapprave. Insurance will not cover at all called it " Excluded drug". Called pt no answer LMOM with coverage status...Alexis Olson

## 2012-09-06 ENCOUNTER — Other Ambulatory Visit: Payer: Self-pay | Admitting: Internal Medicine

## 2012-09-14 ENCOUNTER — Other Ambulatory Visit: Payer: Self-pay | Admitting: Internal Medicine

## 2012-09-28 ENCOUNTER — Other Ambulatory Visit: Payer: Self-pay | Admitting: Internal Medicine

## 2012-10-01 ENCOUNTER — Other Ambulatory Visit: Payer: Self-pay | Admitting: *Deleted

## 2012-10-01 MED ORDER — MAGIC MOUTHWASH: ORAL | Status: DC

## 2012-10-01 NOTE — Telephone Encounter (Signed)
It was prev on med list (check under med hx if unsure) - Ok to refill

## 2012-10-01 NOTE — Telephone Encounter (Signed)
Sent refill.../lmb 

## 2012-10-01 NOTE — Telephone Encounter (Signed)
Requesting refill on magic mouth wash. Not on med list. Pls advise...lmb

## 2012-10-02 ENCOUNTER — Ambulatory Visit (INDEPENDENT_AMBULATORY_CARE_PROVIDER_SITE_OTHER): Payer: Medicare Other | Admitting: Internal Medicine

## 2012-10-02 ENCOUNTER — Other Ambulatory Visit (INDEPENDENT_AMBULATORY_CARE_PROVIDER_SITE_OTHER): Payer: Medicare Other

## 2012-10-02 ENCOUNTER — Encounter: Payer: Self-pay | Admitting: Internal Medicine

## 2012-10-02 VITALS — BP 122/72 | HR 98 | Temp 98.4°F | Wt 201.8 lb

## 2012-10-02 DIAGNOSIS — R5383 Other fatigue: Secondary | ICD-10-CM

## 2012-10-02 DIAGNOSIS — M7989 Other specified soft tissue disorders: Secondary | ICD-10-CM

## 2012-10-02 DIAGNOSIS — M5137 Other intervertebral disc degeneration, lumbosacral region: Secondary | ICD-10-CM

## 2012-10-02 DIAGNOSIS — R609 Edema, unspecified: Secondary | ICD-10-CM

## 2012-10-02 DIAGNOSIS — N390 Urinary tract infection, site not specified: Secondary | ICD-10-CM

## 2012-10-02 DIAGNOSIS — R5381 Other malaise: Secondary | ICD-10-CM

## 2012-10-02 LAB — CBC WITH DIFFERENTIAL/PLATELET
Basophils Absolute: 0 10*3/uL (ref 0.0–0.1)
Basophils Relative: 0.4 % (ref 0.0–3.0)
Eosinophils Absolute: 0.2 10*3/uL (ref 0.0–0.7)
Eosinophils Relative: 2.3 % (ref 0.0–5.0)
HCT: 32.7 % — ABNORMAL LOW (ref 36.0–46.0)
Hemoglobin: 10.9 g/dL — ABNORMAL LOW (ref 12.0–15.0)
Lymphocytes Relative: 26.8 % (ref 12.0–46.0)
Lymphs Abs: 1.9 10*3/uL (ref 0.7–4.0)
MCHC: 33.4 g/dL (ref 30.0–36.0)
MCV: 99.7 fl (ref 78.0–100.0)
Monocytes Absolute: 0.6 10*3/uL (ref 0.1–1.0)
Monocytes Relative: 8.9 % (ref 3.0–12.0)
Neutro Abs: 4.5 10*3/uL (ref 1.4–7.7)
Neutrophils Relative %: 61.6 % (ref 43.0–77.0)
Platelets: 304 10*3/uL (ref 150.0–400.0)
RBC: 3.28 Mil/uL — ABNORMAL LOW (ref 3.87–5.11)
RDW: 13.7 % (ref 11.5–14.6)
WBC: 7.2 10*3/uL (ref 4.5–10.5)

## 2012-10-02 LAB — BASIC METABOLIC PANEL
BUN: 13 mg/dL (ref 6–23)
CO2: 30 mEq/L (ref 19–32)
Calcium: 8.9 mg/dL (ref 8.4–10.5)
Chloride: 103 mEq/L (ref 96–112)
Creatinine, Ser: 0.5 mg/dL (ref 0.4–1.2)
GFR: 116.22 mL/min (ref >60.00)
Glucose, Bld: 101 mg/dL — ABNORMAL HIGH (ref 70–99)
Potassium: 3.8 mEq/L (ref 3.5–5.1)
Sodium: 137 mEq/L (ref 135–145)

## 2012-10-02 LAB — HEPATIC FUNCTION PANEL
ALT: 22 U/L (ref 0–35)
AST: 22 U/L (ref 0–37)
Albumin: 3.7 g/dL (ref 3.5–5.2)
Alkaline Phosphatase: 52 U/L (ref 39–117)
Bilirubin, Direct: 0 mg/dL (ref 0.0–0.3)
Total Bilirubin: 0.4 mg/dL (ref 0.3–1.2)
Total Protein: 7 g/dL (ref 6.0–8.3)

## 2012-10-02 MED ORDER — TRIAMTERENE-HCTZ 37.5-25 MG PO CAPS: 1.0000 | ORAL_CAPSULE | Freq: Every day | ORAL | Status: DC | PRN

## 2012-10-02 NOTE — Progress Notes (Signed)
  Subjective:    Patient ID: Alexis Olson, female    DOB: 09/17/1933, 79 y.o.   MRN: [id number]  HPI   Here for followup -Reviewed interval medical events and history  Also see chief complaint above - edema  Also continued low back pain, planning steroid injection in 2 days to treat same. Has been treated with increase anti-inflammatories and prednisone in past few weeks for same   Past Medical History  Diagnosis Date  . Obesity   . Lumbosacral disc disease     Chronic pain; History of osteomyelitis 2010 following ESI complication  . ALLERGIC RHINITIS   . 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 ANXIETY   . ASTHMA   . Bariatric surgery status   . COLONIC POLYPS, ADENOMATOUS, HX OF   . DIVERTICULOSIS, COLON   . INSOMNIA   . URINARY URGENCY   . VITAMIN B12 DEFICIENCY   . Hypotension     Review of Systems  Constitutional: Negative for fever and fatigue.  Respiratory: Negative for cough, chest tightness and shortness of breath.   Cardiovascular: Positive for leg swelling. Negative for chest pain.  Gastrointestinal: Positive for diarrhea (chronic).  Musculoskeletal: Positive for back pain. Negative for joint swelling.  Skin: Negative for rash and wound.       Objective:   Physical Exam  BP 122/72  Pulse 98  Temp(Src) 98.4 F (36.9 C) (Oral)  Wt 201 lb 12.8 oz (91.536 kg)  BMI 33.58 kg/m2  SpO2 95% Wt Readings from Last 3 Encounters:  10/02/12 201 lb 12.8 oz (91.536 kg)  07/26/12 200 lb 3.2 oz (90.81 kg)  03/26/12 197 lb 6.4 oz (89.54 kg)   Constitutional: She is overweight, but appears well-developed and well-nourished. No distress.  Cardiovascular: Normal rate, regular rhythm and normal heart sounds.  No murmur heard. 1+ L>trace R BLE edema. Pulmonary/Chest: Effort normal and breath sounds normal. No respiratory distress. She has no wheezes.  Psychiatric: She has a slightly anxious mood and affect. Her behavior is normal.  Judgment and thought content normal.   Lab Results  Component Value Date   WBC 8.5 07/16/2012   HGB 11.5* 07/16/2012   HCT 34.2* 07/16/2012   PLT 237.0 07/16/2012   GLUCOSE 102* 08/13/2012   CHOL 163 07/16/2012   TRIG 234.0* 07/16/2012   HDL 47.50 07/16/2012   LDLDIRECT 81.1 07/16/2012   LDLCALC 92 12/27/2010   ALT 15 09/14/2011   AST 21 09/14/2011   NA 133* 08/13/2012   K 3.8 08/13/2012   CL 99 08/13/2012   CREATININE 0.7 08/13/2012   BUN 17 08/13/2012   CO2 27 08/13/2012   TSH 0.83 08/16/2011   INR 1.01 05/13/2009   HGBA1C 5.3 08/03/2006       Assessment & Plan:   Edema L>R LE -  Suspect related to NSAID/pred use for back pain exacerbated by travel - However, given DVT risks of prolonged car travel and remote personal hx DVT, check venous doppler Advised to resume maxide diuretic(takes only prn and none in past 12+ weeks) Check UA, Bmet and LFTs    Fatigue - nonspecific symptoms/exam - check screening labs   Also see problem list. Medications and labs reviewed today.

## 2012-10-02 NOTE — Patient Instructions (Signed)
It was good to see you today. We have reviewed your prior records including labs and tests today Test(s) ordered today. Your results will be released to MyChart (or called to you) after review, usually within 72hours after test completion. If any changes need to be made, you will be notified at that same time. we'll make referral Doppler to exclude leg clot. Our office will contact you regarding appointment(s) once made. Until that time, resume Maxide every morning as needed for swelling - Your prescription(s) have been submitted to your pharmacy. Please take as directed and contact our office if you believe you are having problem(s) with the medication(s).

## 2012-10-02 NOTE — Assessment & Plan Note (Signed)
Chronic low back pain, has followed with PMR and prior NSurg for same History of epidural steroid injection complicated with osteomyelitis in 2010 -treated with 10 weeks antibiotics and resolved Uses Lyrica to help modulate pain symptoms as intolerant of many other narcotics, tramadol and NSAIDs Flare ongoing since 07/2012 - now working with Ramos - ESI planned 7/11 Ok to continue muscle relaxer robaxin as needed

## 2012-10-03 ENCOUNTER — Encounter (INDEPENDENT_AMBULATORY_CARE_PROVIDER_SITE_OTHER): Payer: Medicare Other

## 2012-10-03 ENCOUNTER — Telehealth: Payer: Self-pay | Admitting: *Deleted

## 2012-10-03 DIAGNOSIS — M7989 Other specified soft tissue disorders: Secondary | ICD-10-CM

## 2012-10-03 DIAGNOSIS — M79609 Pain in unspecified limb: Secondary | ICD-10-CM

## 2012-10-03 LAB — URINALYSIS, ROUTINE W REFLEX MICROSCOPIC
Bilirubin Urine: NEGATIVE
Ketones, ur: NEGATIVE
Nitrite: POSITIVE
Specific Gravity, Urine: <=1.005 (ref 1.000–1.030)
Total Protein, Urine: NEGATIVE
Urine Glucose: NEGATIVE
Urobilinogen, UA: 0.2 (ref 0.0–1.0)
pH: 6 (ref 5.0–8.0)

## 2012-10-03 MED ORDER — SULFAMETHOXAZOLE-TMP DS 800-160 MG PO TABS: 1.0000 | ORAL_TABLET | Freq: Two times a day (BID) | ORAL | Status: DC

## 2012-10-03 MED ORDER — CEPHALEXIN 500 MG PO CAPS: 500.0000 mg | ORAL_CAPSULE | Freq: Three times a day (TID) | ORAL | Status: DC

## 2012-10-03 NOTE — Addendum Note (Signed)
Addended by: Rene Paci A on: 10/03/2012 12:56 PM   Modules accepted: Orders

## 2012-10-03 NOTE — Telephone Encounter (Signed)
Called pt concerning labs. MD rx Kelflex for UTI. Pt states she would rather have the Sulfamoxazole-Trimethoprim 800-160 mg instead. Have taken in the past & works better for her. Pls advise,,,/lmb

## 2012-10-03 NOTE — Telephone Encounter (Signed)
Ok to switch to Septra 1 tab BID x 5 days, # 10, no refills

## 2012-10-03 NOTE — Telephone Encounter (Signed)
[facility]...lmb

## 2012-10-04 ENCOUNTER — Telehealth: Payer: Self-pay | Admitting: *Deleted

## 2012-10-04 NOTE — Telephone Encounter (Signed)
Pt called states the magic mouth wash is $50 with her insurance.  She is requesting the Rx be written in her husbands name so that it will only be $12.  Please advise.

## 2012-10-05 NOTE — Telephone Encounter (Signed)
Sorry - no.  This would be unethical medical practice and I am uncomfortable doing so.

## 2012-10-07 MED ORDER — NYSTATIN 100000 UNIT/ML MT SUSP: 500000.0000 [IU] | Freq: Four times a day (QID) | OROMUCOSAL | Status: DC

## 2012-10-07 NOTE — Telephone Encounter (Signed)
Ok erx done

## 2012-10-07 NOTE — Telephone Encounter (Signed)
Pt requests a Rx for Nystatin instead of the Magic Mouthwash.  Please advise

## 2012-10-07 NOTE — Telephone Encounter (Signed)
Left message on VM that Rx has been sent.

## 2012-10-10 ENCOUNTER — Telehealth: Payer: Self-pay | Admitting: *Deleted

## 2012-10-10 DIAGNOSIS — D649 Anemia, unspecified: Secondary | ICD-10-CM

## 2012-10-10 NOTE — Telephone Encounter (Signed)
Message copied by Daphine Deutscher on Thu Oct 10, 2012 11:07 AM ------      Message from: Daphine Deutscher      Created: Thu Apr 18, 2012  9:13 AM       Call and remind due for b12 level for DB on 10/14/12 ------

## 2012-10-10 NOTE — Telephone Encounter (Signed)
Spoke with patient and reminded her of B 12 level due for Dr. Juanda Chance. She is asking if she needs to have a CBC rechecked. [place] CBC for Dr. Lennox Grumbles was on 10/02/12  Hgb 10.9 Please, advise.

## 2012-10-10 NOTE — Telephone Encounter (Signed)
Patient notified of recommendations. Labs in EPIC. 

## 2012-10-10 NOTE — Telephone Encounter (Signed)
No need for CBC now, but her Hgb has dropped some so I would repeat it in 8 weeks, also check Iron panel at that time. She may wait with B12 level till then to have checked all together.

## 2012-10-17 ENCOUNTER — Other Ambulatory Visit: Payer: Self-pay | Admitting: Internal Medicine

## 2012-10-31 ENCOUNTER — Other Ambulatory Visit: Payer: Self-pay | Admitting: Internal Medicine

## 2012-10-31 ENCOUNTER — Telehealth: Payer: Self-pay

## 2012-10-31 MED ORDER — SULFAMETHOXAZOLE-TMP DS 800-160 MG PO TABS: 1.0000 | ORAL_TABLET | Freq: Two times a day (BID) | ORAL | Status: DC

## 2012-10-31 MED ORDER — CIPROFLOXACIN HCL 500 MG PO TABS: 500.0000 mg | ORAL_TABLET | Freq: Two times a day (BID) | ORAL | Status: DC

## 2012-10-31 NOTE — Telephone Encounter (Signed)
Per pt cant take cipro due to past reaction and request generic septra

## 2012-10-31 NOTE — Telephone Encounter (Signed)
Switched to [facility]

## 2012-10-31 NOTE — Telephone Encounter (Signed)
Patient called [facility] stating that she was recently treated for UTI but it has returned (frequentcy and burning). She is now out of town at [place] and would like to request another antibiotic c/o . Thanks

## 2012-10-31 NOTE — Telephone Encounter (Signed)
Ok for [employer] for cipro 500 mg BID x 5 days, # 10, no refills

## 2012-11-06 ENCOUNTER — Other Ambulatory Visit: Payer: Self-pay | Admitting: *Deleted

## 2012-11-06 MED ORDER — NABUMETONE 750 MG PO TABS: 750.0000 mg | ORAL_TABLET | Freq: Two times a day (BID) | ORAL | Status: DC

## 2012-11-18 ENCOUNTER — Other Ambulatory Visit (INDEPENDENT_AMBULATORY_CARE_PROVIDER_SITE_OTHER): Payer: Medicare Other

## 2012-11-18 ENCOUNTER — Telehealth: Payer: Self-pay | Admitting: *Deleted

## 2012-11-18 DIAGNOSIS — D649 Anemia, unspecified: Secondary | ICD-10-CM

## 2012-11-18 DIAGNOSIS — D509 Iron deficiency anemia, unspecified: Secondary | ICD-10-CM

## 2012-11-18 DIAGNOSIS — E538 Deficiency of other specified B group vitamins: Secondary | ICD-10-CM

## 2012-11-18 LAB — CBC WITH DIFFERENTIAL/PLATELET
Basophils Absolute: 0 10*3/uL (ref 0.0–0.1)
Basophils Relative: 0.2 % (ref 0.0–3.0)
Eosinophils Absolute: 0.2 10*3/uL (ref 0.0–0.7)
Eosinophils Relative: 1.6 % (ref 0.0–5.0)
HCT: 34.8 % — ABNORMAL LOW (ref 36.0–46.0)
Hemoglobin: 11.6 g/dL — ABNORMAL LOW (ref 12.0–15.0)
Lymphocytes Relative: 28.7 % (ref 12.0–46.0)
Lymphs Abs: 3.5 10*3/uL (ref 0.7–4.0)
MCHC: 33.4 g/dL (ref 30.0–36.0)
MCV: 95 fl (ref 78.0–100.0)
Monocytes Absolute: 1.2 10*3/uL — ABNORMAL HIGH (ref 0.1–1.0)
Monocytes Relative: 9.7 % (ref 3.0–12.0)
Neutro Abs: 7.3 10*3/uL (ref 1.4–7.7)
Neutrophils Relative %: 59.8 % (ref 43.0–77.0)
Platelets: 314 10*3/uL (ref 150.0–400.0)
RBC: 3.67 Mil/uL — ABNORMAL LOW (ref 3.87–5.11)
RDW: 14.4 % (ref 11.5–14.6)
WBC: 12.1 10*3/uL — ABNORMAL HIGH (ref 4.5–10.5)

## 2012-11-18 NOTE — Telephone Encounter (Signed)
Spoke with patient and reminded her of labs due. She will come this week.

## 2012-11-18 NOTE — Telephone Encounter (Signed)
Message copied by Daphine Deutscher on Mon Nov 18, 2012  8:58 AM ------      Message from: Daphine Deutscher      Created: Wed Jul 17, 2012  9:37 AM       Call and remind patient cbc, ibc panel due for DB 11/18/12 ------

## 2012-11-19 LAB — VITAMIN B12: Vitamin B-12: 329 pg/mL (ref 211–911)

## 2012-11-19 LAB — IBC PANEL
Iron: 43 ug/dL (ref 42–145)
Saturation Ratios: 17.2 % — ABNORMAL LOW (ref 20.0–50.0)
Transferrin: 179 mg/dL — ABNORMAL LOW (ref 212.0–360.0)

## 2012-11-20 ENCOUNTER — Telehealth: Payer: Self-pay | Admitting: Internal Medicine

## 2012-11-20 ENCOUNTER — Other Ambulatory Visit: Payer: Self-pay | Admitting: *Deleted

## 2012-11-20 DIAGNOSIS — D649 Anemia, unspecified: Secondary | ICD-10-CM

## 2012-11-20 DIAGNOSIS — E538 Deficiency of other specified B group vitamins: Secondary | ICD-10-CM

## 2012-11-20 NOTE — Telephone Encounter (Signed)
Spoke with patient and gave her results and mailed her a copy of labs.

## 2012-12-01 ENCOUNTER — Other Ambulatory Visit: Payer: Self-pay | Admitting: Internal Medicine

## 2012-12-02 ENCOUNTER — Other Ambulatory Visit: Payer: Self-pay | Admitting: Internal Medicine

## 2012-12-03 ENCOUNTER — Other Ambulatory Visit: Payer: Self-pay | Admitting: Internal Medicine

## 2012-12-05 ENCOUNTER — Ambulatory Visit: Payer: Medicare Other | Admitting: Internal Medicine

## 2012-12-11 ENCOUNTER — Other Ambulatory Visit: Payer: Self-pay | Admitting: Internal Medicine

## 2012-12-12 ENCOUNTER — Telehealth: Payer: Self-pay | Admitting: Internal Medicine

## 2012-12-12 NOTE — Telephone Encounter (Signed)
Pt request more Cephalexin 500 mg to be send to the drug store. Pt stated she is feeling better from uti because of this antibiotic, but she need more. Please advise.

## 2012-12-12 NOTE — Telephone Encounter (Signed)
Please consider seeing Rene Kocher to see if this is appropriate

## 2012-12-13 NOTE — Telephone Encounter (Signed)
Spoke with pt advised of MDs message 

## 2012-12-13 NOTE — Telephone Encounter (Signed)
Called the patient on cell and home number left message to call back

## 2012-12-27 ENCOUNTER — Encounter: Payer: Self-pay | Admitting: Internal Medicine

## 2012-12-27 ENCOUNTER — Ambulatory Visit (INDEPENDENT_AMBULATORY_CARE_PROVIDER_SITE_OTHER): Payer: Medicare Other | Admitting: Internal Medicine

## 2012-12-27 ENCOUNTER — Telehealth: Payer: Self-pay | Admitting: Internal Medicine

## 2012-12-27 VITALS — BP 122/70 | HR 99 | Temp 98.0°F | Ht 65.5 in | Wt 195.5 lb

## 2012-12-27 DIAGNOSIS — I809 Phlebitis and thrombophlebitis of unspecified site: Secondary | ICD-10-CM

## 2012-12-27 DIAGNOSIS — Z23 Encounter for immunization: Secondary | ICD-10-CM

## 2012-12-27 MED ORDER — BENZONATATE 100 MG PO CAPS: ORAL_CAPSULE | ORAL | Status: DC

## 2012-12-27 NOTE — Patient Instructions (Signed)
OK to take the Tylenol #2 as you have at home for pain, as we cannot start the Aspirin Please keep legs elevated when sitting Warm compresses can help as well (but not ice) Please continue your compression stocking use as you do Please followup with any worsening pain, red, swelling, fever  Please remember to sign up for My Chart if you have not done so, as this will be important to you in the future with finding out test results, communicating by private email, and scheduling acute appointments online when needed.

## 2012-12-27 NOTE — Progress Notes (Signed)
Subjective:    Patient ID: Alexis Olson, female    DOB: 1933/05/08, 79 y.o.   MRN: [id number]  HPI  Pt of Dr Felicity Coyer, retired [employer] fund[employer], Here with ? Of cellulitis as she has had hx of;  Has hx of "blood clots" to the left leg, long hx of varicosities, does not wear her compression stockings except for last night wore them back from the beach; itching worse again last night, atarax and triam did not help completely, covers hurt at night to touch both legs;  No fever, chills though has some ? Low grade with allergies in the spring and fall.  Due for flu shot.  Does not take ASA due to low grade chronic Gi bleeding and anemia, usually gets IV iron every 1-2 yrs.  Overall good compliance with treatment, and good medicine tolerability. Past Medical History  Diagnosis Date  . Obesity   . Lumbosacral disc disease     Chronic pain; History of osteomyelitis 2010 following ESI complication  . ALLERGIC RHINITIS   . 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 ANXIETY   . ASTHMA   . Bariatric surgery status   . COLONIC POLYPS, ADENOMATOUS, HX OF   . DIVERTICULOSIS, COLON   . INSOMNIA   . URINARY URGENCY   . VITAMIN B12 DEFICIENCY   . Hypotension    Past Surgical History  Procedure Laterality Date  . Cholecystectomy    . Tonsillectomy    . Ileojejunal bypass  1976    for obesity  . Back surgery  1994    for ruptured disc    reports that she has never smoked. She has never used smokeless tobacco. She reports that she does not drink alcohol or use illicit drugs. family history includes Cervical cancer in her mother; Cirrhosis in her sister; Kidney disease in her sister; Liver disease in her sister. There is no history of Colon cancer. Allergies  Allergen Reactions  . Azithromycin     REACTION: unspecified  . Ciprofloxacin Hcl     REACTION: causes yeast inf  and refuses to take  . Codeine Phosphate     REACTION: unspecified  . Hydrocodone   .  Hydrocodone-Homatropine     REACTION: unspecified  . Levaquin [Levofloxacin In D5w] Other (See Comments)    Muscle soreness  . Meperidine Hcl   . Ultram [Tramadol Hcl]     Review of Systems All otherwise neg per pt     Objective:   Physical Exam BP 122/70  Pulse 99  Temp(Src) 98 F (36.7 C) (Oral)  Ht 5' 5.5" (1.664 m)  Wt 195 lb 8 oz (88.678 kg)  BMI 32.03 kg/m2  SpO2 93% VS noted,  Constitutional: Pt appears well-developed and well-nourished.  HENT: Head: NCAT.  Right Ear: External ear normal.  Left Ear: External ear normal.  Eyes: Conjunctivae and EOM are normal. Pupils are equal, round, and reactive to light.  Neck: Normal range of motion. Neck supple.  Cardiovascular: Normal rate and regular rhythm.   Pulmonary/Chest: Effort normal and breath sounds normal.  Neurological: Pt is alert. Not confused  Skin: Skin is warm No erythema. Does have one area right medial leg approx 1-2 cm warm/tender/firm, as well as smaller area left medial mid leg with more definite subq phlebitic palpated, no red streaks, ulcers, swelling or induration, no drainage Psychiatric: Pt behavior is normal. Thought content normal.         Assessment &  Plan:

## 2012-12-27 NOTE — Telephone Encounter (Signed)
Message copied by Corwin Levins on Fri Dec 27, 2012  4:50 PM ------      Message from: Scharlene Gloss B      Created: Fri Dec 27, 2012  3:09 PM       The patient would like a refill on tessalon perles for coughing at night. ------

## 2012-12-27 NOTE — Telephone Encounter (Signed)
Done erx 

## 2012-12-27 NOTE — Assessment & Plan Note (Signed)
D/w pt, no evidence for cellulitis, cannnot take asa, no need antibx at this time, ok to use her tylenol #2 as she has at home,  to f/u any worsening symptoms or concerns, for leg elevation, compression stocking, cont all other meds

## 2012-12-29 ENCOUNTER — Other Ambulatory Visit: Payer: Self-pay | Admitting: Internal Medicine

## 2012-12-30 NOTE — Telephone Encounter (Signed)
[facility]lmb 

## 2013-01-28 ENCOUNTER — Other Ambulatory Visit: Payer: Self-pay | Admitting: *Deleted

## 2013-01-28 MED ORDER — PREGABALIN 50 MG PO CAPS: ORAL_CAPSULE | ORAL | Status: DC

## 2013-01-28 NOTE — Telephone Encounter (Signed)
Faxed script back to [facility]Marland Kitchenlmb

## 2013-01-30 ENCOUNTER — Telehealth: Payer: Self-pay | Admitting: Internal Medicine

## 2013-01-30 DIAGNOSIS — D509 Iron deficiency anemia, unspecified: Secondary | ICD-10-CM

## 2013-01-30 NOTE — Telephone Encounter (Signed)
Labs in EPIC. Patient aware. 

## 2013-01-30 NOTE — Telephone Encounter (Signed)
Please obtain CBC, Iron panel and B12. thanx

## 2013-01-30 NOTE — Telephone Encounter (Signed)
Patient states she is tired when she gets up and when she goes to bed. Sometimes she has SOB. Asking for labs to be checked. Also, scheduled routine OV for patient 03/04/13 at 2:30 PM.Please, advise if patient can have labs.

## 2013-01-31 ENCOUNTER — Other Ambulatory Visit (INDEPENDENT_AMBULATORY_CARE_PROVIDER_SITE_OTHER): Payer: Medicare Other

## 2013-01-31 DIAGNOSIS — D509 Iron deficiency anemia, unspecified: Secondary | ICD-10-CM

## 2013-01-31 LAB — CBC WITH DIFFERENTIAL/PLATELET
Basophils Absolute: 0 10*3/uL (ref 0.0–0.1)
Basophils Relative: 0.4 % (ref 0.0–3.0)
Eosinophils Absolute: 0.3 10*3/uL (ref 0.0–0.7)
Eosinophils Relative: 3.6 % (ref 0.0–5.0)
HCT: 34.3 % — ABNORMAL LOW (ref 36.0–46.0)
Hemoglobin: 11.6 g/dL — ABNORMAL LOW (ref 12.0–15.0)
Lymphocytes Relative: 32.1 % (ref 12.0–46.0)
Lymphs Abs: 2.9 10*3/uL (ref 0.7–4.0)
MCHC: 33.9 g/dL (ref 30.0–36.0)
MCV: 93 fl (ref 78.0–100.0)
Monocytes Absolute: 0.8 10*3/uL (ref 0.1–1.0)
Monocytes Relative: 9.2 % (ref 3.0–12.0)
Neutro Abs: 4.9 10*3/uL (ref 1.4–7.7)
Neutrophils Relative %: 54.7 % (ref 43.0–77.0)
Platelets: 290 10*3/uL (ref 150.0–400.0)
RBC: 3.69 Mil/uL — ABNORMAL LOW (ref 3.87–5.11)
RDW: 14.8 % — ABNORMAL HIGH (ref 11.5–14.6)
WBC: 8.9 10*3/uL (ref 4.5–10.5)

## 2013-01-31 LAB — IBC PANEL
Iron: 44 ug/dL (ref 42–145)
Saturation Ratios: 16.3 % — ABNORMAL LOW (ref 20.0–50.0)
Transferrin: 192.9 mg/dL — ABNORMAL LOW (ref 212.0–360.0)

## 2013-01-31 LAB — VITAMIN B12: Vitamin B-12: 256 pg/mL (ref 211–911)

## 2013-02-05 ENCOUNTER — Other Ambulatory Visit: Payer: Self-pay | Admitting: *Deleted

## 2013-02-05 DIAGNOSIS — E538 Deficiency of other specified B group vitamins: Secondary | ICD-10-CM

## 2013-02-05 MED ORDER — CYANOCOBALAMIN 1000 MCG/ML IJ SOLN: 1000.0000 ug | INTRAMUSCULAR | Status: DC

## 2013-02-06 ENCOUNTER — Telehealth: Payer: Self-pay | Admitting: *Deleted

## 2013-02-06 NOTE — Telephone Encounter (Signed)
Pt request information concerning recovering of the orthotics made by Dr. Wynelle Cleveland not the soft ones, the hard ones.  Please call [phone] or [phone].

## 2013-02-07 ENCOUNTER — Telehealth: Payer: Self-pay | Admitting: Internal Medicine

## 2013-02-07 NOTE — Telephone Encounter (Signed)
Spoke with patient and she states the B12 is $9.83/vial. She is wondering if it comes in any other form that is cheaper. Discussed the nasal spray is more expensive. She will continue the injections.

## 2013-02-09 ENCOUNTER — Other Ambulatory Visit: Payer: Self-pay | Admitting: Internal Medicine

## 2013-02-10 ENCOUNTER — Ambulatory Visit (INDEPENDENT_AMBULATORY_CARE_PROVIDER_SITE_OTHER)
Admission: RE | Admit: 2013-02-10 | Discharge: 2013-02-10 | Disposition: A | Discharge status: Home / Self Care | Payer: Medicare Other | Source: Ambulatory Visit | Attending: Internal Medicine | Admitting: Internal Medicine

## 2013-02-10 ENCOUNTER — Ambulatory Visit (INDEPENDENT_AMBULATORY_CARE_PROVIDER_SITE_OTHER): Payer: Medicare Other | Admitting: Internal Medicine

## 2013-02-10 ENCOUNTER — Encounter: Payer: Self-pay | Admitting: Internal Medicine

## 2013-02-10 VITALS — BP 138/82 | HR 87 | Temp 98.7°F | Resp 16 | Ht 65.0 in | Wt 201.0 lb

## 2013-02-10 DIAGNOSIS — R059 Cough, unspecified: Secondary | ICD-10-CM | POA: Insufficient documentation

## 2013-02-10 DIAGNOSIS — R05 Cough: Secondary | ICD-10-CM

## 2013-02-10 DIAGNOSIS — J45901 Unspecified asthma with (acute) exacerbation: Secondary | ICD-10-CM | POA: Insufficient documentation

## 2013-02-10 MED ORDER — BUDESONIDE-FORMOTEROL FUMARATE 80-4.5 MCG/ACT IN AERO: 2.0000 | INHALATION_SPRAY | Freq: Two times a day (BID) | RESPIRATORY_TRACT | Status: DC

## 2013-02-10 NOTE — Patient Instructions (Signed)
Asthma, Adult Asthma is a recurring condition in which the airways tighten and narrow. Asthma can make it difficult to breathe. It can cause coughing, wheezing, and shortness of breath. Asthma episodes (also called asthma attacks) range from minor to life-threatening. Asthma cannot be cured, but medicines and lifestyle changes can help control it. CAUSES Asthma is believed to be caused by inherited (genetic) and environmental factors, but its exact cause is unknown. Asthma may be triggered by allergens, lung infections, or irritants in the air. Asthma triggers are different for each person. Common triggers include:   Animal dander.  Dust mites.  Cockroaches.  Pollen from trees or grass.  Mold.  Smoke.  Air pollutants such as dust, household cleaners, hair sprays, aerosol sprays, paint fumes, strong chemicals, or strong odors.  Cold air, weather changes, and winds (which increase molds and pollens in the air).  Strong emotional expressions such as crying or laughing hard.  Stress.  Certain medicines (such as aspirin) or types of drugs (such as beta-blockers).  Sulfites in foods and drinks. Foods and drinks that may contain sulfites include dried fruit, potato chips, and sparkling grape juice.  Infections or inflammatory conditions such as the flu, a cold, or an inflammation of the nasal membranes (rhinitis).  Gastroesophageal reflux disease (GERD).  Exercise or strenuous activity. SYMPTOMS Symptoms may occur immediately after asthma is triggered or many hours later. Symptoms include:  Wheezing.  Excessive nighttime or early morning coughing.  Frequent or severe coughing with a common cold.  Chest tightness.  Shortness of breath. DIAGNOSIS  The diagnosis of asthma is made by a review of your medical history and a physical exam. Tests may also be performed. These may include:  Lung function studies. These tests show how much air you breath in and out.  Allergy  tests.  Imaging tests such as X-rays. TREATMENT  Asthma cannot be cured, but it can usually be controlled. Treatment involves identifying and avoiding your asthma triggers. It also involves medicines. There are 2 classes of medicine used for asthma treatment:   Controller medicines. These prevent asthma symptoms from occurring. They are usually taken every day.  Reliever or rescue medicines. These quickly relieve asthma symptoms. They are used as needed and provide short-term relief. Your health care provider will help you create an asthma action plan. An asthma action plan is a written plan for managing and treating your asthma attacks. It includes a list of your asthma triggers and how they may be avoided. It also includes information on when medicines should be taken and when their dosage should be changed. An action plan may also involve the use of a device called a peak flow meter. A peak flow meter measures how well the lungs are working. It helps you monitor your condition. HOME CARE INSTRUCTIONS   Take medicine as directed by your health care provider. Speak with your health care provider if you have questions about how or when to take the medicines.  Use a peak flow meter as directed by your health care provider. Record and keep track of readings.  Understand and use the action plan to help minimize or stop an asthma attack without needing to seek medical care.  Control your home environment in the following ways to help prevent asthma attacks:  Do not smoke. Avoid being exposed to secondhand smoke.  Change your heating and air conditioning filter regularly.  Limit your use of fireplaces and wood stoves.  Get rid of pests (such as roaches and   mice) and their droppings.  Throw away plants if you see mold on them.  Clean your floors and dust regularly. Use unscented cleaning products.  Try to have someone else vacuum for you regularly. Stay out of rooms while they are being  vacuumed and for a short while afterward. If you vacuum, use a dust mask from a hardware store, a double-layered or microfilter vacuum cleaner bag, or a vacuum cleaner with a HEPA filter.  Replace carpet with wood, tile, or vinyl flooring. Carpet can trap dander and dust.  Use allergy-proof pillows, mattress covers, and box spring covers.  Wash bed sheets and blankets every week in hot water and dry them in a dryer.  Use blankets that are made of polyester or cotton.  Clean bathrooms and kitchens with bleach. If possible, have someone repaint the walls in these rooms with mold-resistant paint. Keep out of the rooms that are being cleaned and painted.  Wash hands frequently. SEEK MEDICAL CARE IF:   You have wheezing, shortness of breath, or a cough even if taking medicine to prevent attacks.  The colored mucus you cough up (sputum) is thicker than usual.  Your sputum changes from clear or white to yellow, green, gray, or bloody.  You have any problems that may be related to the medicines you are taking (such as a rash, itching, swelling, or trouble breathing).  You are using a reliever medicine more than 2 3 times per week.  Your peak flow is still at 50 79% of you personal best after following your action plan for 1 hour. SEEK IMMEDIATE MEDICAL CARE IF:   You seem to be getting worse and are unresponsive to treatment during an asthma attack.  You are short of breath even at rest.  You get short of breath when doing very little physical activity.  You have difficulty eating, drinking, or talking due to asthma symptoms.  You develop chest pain.  You develop a fast heartbeat.  You have a bluish color to your lips or fingernails.  You are lightheaded, dizzy, or faint.  Your peak flow is less than 50% of your personal best.  You have a fever or persistent symptoms for more than 2 3 days.  You have a fever and symptoms suddenly get worse. MAKE SURE YOU:   Understand these  instructions.  Will watch your condition.  Will get help right away if you are not doing well or get worse. Document Released: 03/13/2005 Document Revised: 11/13/2012 Document Reviewed: 10/10/2012 [employer] Patient Information 2014 [employer].  

## 2013-02-10 NOTE — Progress Notes (Signed)
Pre visit review using our clinic review tool, if applicable. No additional management support is needed unless otherwise documented below in the visit note. 

## 2013-02-11 ENCOUNTER — Telehealth: Payer: Self-pay | Admitting: Internal Medicine

## 2013-02-11 MED ORDER — METHYLPREDNISOLONE ACETATE 80 MG/ML IJ SUSP
120.0000 mg | Freq: Once | INTRAMUSCULAR | Status: AC
  Administered 2013-02-11: 120 mg via INTRAMUSCULAR

## 2013-02-11 NOTE — Telephone Encounter (Signed)
Her chest xray was normal -

## 2013-02-11 NOTE — Telephone Encounter (Signed)
Patient called wanting to know who will be calling her with the results from yesterday's test  Stated she was told she will get a ph call back today

## 2013-02-11 NOTE — Telephone Encounter (Signed)
Called to inform patient about results

## 2013-02-12 NOTE — Assessment & Plan Note (Signed)
She is having a flare of her symptoms so I gave her an injection of depo-medrol IM Also, I have asked her to start using symbicort - I showed her how to use this and she demonstrated proficiency with its use

## 2013-02-12 NOTE — Assessment & Plan Note (Signed)
Her CXR is negative for mass, PNA, edema

## 2013-02-12 NOTE — Progress Notes (Signed)
  Subjective:    Patient ID: Alexis Olson, female    DOB: 04-Dec-1933, 79 y.o.   MRN: [id number]  Cough This is a recurrent problem. Episode onset: for 2 months. The problem has been unchanged. The problem occurs every few hours. The cough is non-productive. Associated symptoms include wheezing. Pertinent negatives include no chest pain, chills, ear congestion, ear pain, fever, headaches, heartburn, hemoptysis, myalgias, nasal congestion, postnasal drip, rash, rhinorrhea, sore throat, shortness of breath, sweats or weight loss. Nothing aggravates the symptoms. She has tried a beta-agonist inhaler for the symptoms. The treatment provided moderate relief. Her past medical history is significant for asthma. There is no history of bronchiectasis, bronchitis, COPD, emphysema, environmental allergies or pneumonia.      Review of Systems  Constitutional: Negative.  Negative for fever, chills, weight loss, diaphoresis, activity change, appetite change, fatigue and unexpected weight change.  HENT: Negative.  Negative for ear pain, postnasal drip, rhinorrhea, sinus pressure, sore throat, tinnitus, trouble swallowing and voice change.   Eyes: Negative.   Respiratory: Positive for cough and wheezing. Negative for apnea, hemoptysis, choking, chest tightness, shortness of breath and stridor.   Cardiovascular: Negative for chest pain, palpitations and leg swelling.  Gastrointestinal: Negative.  Negative for heartburn, nausea, vomiting, abdominal pain, diarrhea, constipation and blood in stool.  Endocrine: Negative.   Genitourinary: Negative.   Musculoskeletal: Negative.  Negative for myalgias.  Skin: Negative.  Negative for rash.  Allergic/Immunologic: Negative.  Negative for environmental allergies.  Neurological: Negative.  Negative for dizziness, tremors, weakness, light-headedness and headaches.  Hematological: Negative.  Negative for adenopathy. Does not bruise/bleed easily.  Psychiatric/Behavioral:  Negative.        Objective:   Physical Exam  Vitals reviewed. Constitutional: She is oriented to person, place, and time. She appears well-developed and well-nourished. No distress.  HENT:  Head: Normocephalic and atraumatic.  Mouth/Throat: Oropharynx is clear and moist. No oropharyngeal exudate.  Eyes: Conjunctivae are normal. Right eye exhibits no discharge. Left eye exhibits no discharge. No scleral icterus.  Neck: Normal range of motion. Neck supple. No JVD present. No tracheal deviation present. No thyromegaly present.  Cardiovascular: Normal rate, regular rhythm, normal heart sounds and intact distal pulses.  Exam reveals no gallop and no friction rub.   No murmur heard. Pulmonary/Chest: Effort normal. No accessory muscle usage or stridor. Not tachypneic. No respiratory distress. She has no decreased breath sounds. She has wheezes in the right upper field and the left upper field. She has rhonchi in the right middle field and the left middle field. She has no rales. She exhibits no tenderness.  Abdominal: Soft. Bowel sounds are normal. She exhibits no distension and no mass. There is no tenderness. There is no rebound and no guarding.  Musculoskeletal: Normal range of motion. She exhibits no edema and no tenderness.  Lymphadenopathy:    She has no cervical adenopathy.  Neurological: She is oriented to person, place, and time.  Skin: Skin is warm and dry. No rash noted. She is not diaphoretic. No erythema. No pallor.  Psychiatric: She has a normal mood and affect. Her behavior is normal. Judgment and thought content normal.          Assessment & Plan:

## 2013-02-14 ENCOUNTER — Telehealth: Payer: Self-pay | Admitting: *Deleted

## 2013-02-14 MED ORDER — CEPHALEXIN 500 MG PO CAPS: 500.0000 mg | ORAL_CAPSULE | Freq: Three times a day (TID) | ORAL | Status: DC

## 2013-02-14 NOTE — Telephone Encounter (Signed)
keflex x 7 d (given allg/intol)- erx done [phone] (home) [phone] (work) Pt's husband informed of same and agrees to relay the information to her

## 2013-02-14 NOTE — Telephone Encounter (Signed)
Pt called states she continues to have green mucus.  She is requesting an antibiotic.  Please advise

## 2013-02-23 ENCOUNTER — Other Ambulatory Visit: Payer: Self-pay | Admitting: Internal Medicine

## 2013-02-27 ENCOUNTER — Other Ambulatory Visit: Payer: Self-pay | Admitting: Internal Medicine

## 2013-02-28 ENCOUNTER — Other Ambulatory Visit: Payer: Self-pay | Admitting: Internal Medicine

## 2013-02-28 NOTE — Telephone Encounter (Signed)
[facility].../lmb 

## 2013-03-04 ENCOUNTER — Encounter: Payer: Self-pay | Admitting: Internal Medicine

## 2013-03-04 ENCOUNTER — Ambulatory Visit (INDEPENDENT_AMBULATORY_CARE_PROVIDER_SITE_OTHER): Payer: Medicare Other | Admitting: Internal Medicine

## 2013-03-04 VITALS — BP 122/78 | HR 72 | Ht 65.0 in | Wt 196.6 lb

## 2013-03-04 DIAGNOSIS — D509 Iron deficiency anemia, unspecified: Secondary | ICD-10-CM

## 2013-03-04 DIAGNOSIS — R195 Other fecal abnormalities: Secondary | ICD-10-CM

## 2013-03-04 DIAGNOSIS — E538 Deficiency of other specified B group vitamins: Secondary | ICD-10-CM

## 2013-03-04 MED ORDER — DIPHENOXYLATE-ATROPINE 2.5-0.025 MG PO TABS: ORAL_TABLET | ORAL | Status: DC

## 2013-03-04 MED ORDER — DICYCLOMINE HCL 10 MG PO CAPS: 10.0000 mg | ORAL_CAPSULE | Freq: Three times a day (TID) | ORAL | Status: DC

## 2013-03-04 MED ORDER — HEMOCYTE PLUS 106-1 MG PO CAPS: ORAL_CAPSULE | ORAL | Status: DC

## 2013-03-04 NOTE — Progress Notes (Signed)
Alexis [address] Mar 11, 1934 [id number]   History of Present Illness:  This is a 79 year old white female who is post jejunal ileal bypass for obesity in 1960 resulting in chronic B12 and iron malabsorption as well as chronic diarrhea. She has positive IBD markers. She takes Imodium or Lomotil. Her colonoscopy in 2006 and again in 2010 showed a tubular adenoma but no evudence of IBD. An upper endoscopy and enteroscopy with small bowel biopsies in June 2010 where negative for sprue. A small bowel capsule endoscopy in June 2008 was incomplete. The capsule did not reach the cecum. Her most recent iron saturation was 16.3%. Her hemoglobin was 11.6. B12 was 256. She needs a refill for Lomotil, Bentyl and Hemocyte plus.  Past Medical History  Diagnosis Date  . Obesity   . Lumbosacral disc disease     Chronic pain; History of osteomyelitis 2010 following ESI complication  . ALLERGIC RHINITIS   . 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 ANXIETY   . ASTHMA   . Bariatric surgery status   . COLONIC POLYPS, ADENOMATOUS, HX OF   . DIVERTICULOSIS, COLON   . INSOMNIA   . URINARY URGENCY   . VITAMIN B12 DEFICIENCY   . Hypotension     Past Surgical History  Procedure Laterality Date  . Cholecystectomy    . Tonsillectomy    . Ileojejunal bypass  1976    for obesity  . Back surgery  1994    for ruptured disc    Allergies  Allergen Reactions  . Azithromycin     REACTION: unspecified  . Ciprofloxacin Hcl     REACTION: causes yeast inf  and refuses to take  . Codeine Phosphate     REACTION: unspecified  . Hydrocodone   . Hydrocodone-Homatropine     REACTION: unspecified  . Levaquin [Levofloxacin In D5w] Other (See Comments)    Muscle soreness  . Meperidine Hcl   . Ultram [Tramadol Hcl]     Review of Systems: Occasional dry mouth and dysphagia  The remainder of the 10 point ROS is negative except as outlined in the H&P  Physical Exam: General  Appearance Well developed, in no distress, obese, appears tired Eyes  Non icteric  HEENT  Non traumatic, normocephalic  Mouth No lesion, tongue papillated, no cheilosis Neck Supple without adenopathy, thyroid not enlarged, no carotid bruits, no JVD Lungs Clear to auscultation bilaterally COR Normal S1, normal S2, regular rhythm, no murmur, quiet precordium Abdomen Soft, nontender withhyperactive bowel sounds no tenderness. Liver edge at costal margin Rectal soft, Hemoccult positive Extremities  No pedal edema Skin No lesions Neurological Alert and oriented x 3 Psychological Normal mood and affect  Assessment and Plan:   79 year old white female 50 years post gastric bypass. She has chronic iron malabsorption as well as B12 malabsorption and continues to supplement on a daily basis. She needs CBC every 6 months. She is up-to-date on her upper endoscopy and colonoscopy. We will refill her medications. She will be changing insurances in January 2015.    [name] 03/04/2013

## 2013-03-04 NOTE — Patient Instructions (Signed)
We have sent the following medications to your pharmacy for you to pick up at your convenience: Lomotil Bentyl Hemocyte Plus  Please follow up with Dr Juanda Chance in 1 year.  CC: Dr Rene Paci

## 2013-04-09 ENCOUNTER — Other Ambulatory Visit: Payer: Self-pay | Admitting: Internal Medicine

## 2013-04-09 ENCOUNTER — Encounter: Payer: Self-pay | Admitting: Internal Medicine

## 2013-04-09 ENCOUNTER — Ambulatory Visit (INDEPENDENT_AMBULATORY_CARE_PROVIDER_SITE_OTHER): Payer: Medicare HMO | Admitting: Internal Medicine

## 2013-04-09 VITALS — BP 122/72 | HR 96 | Temp 97.9°F | Wt 196.8 lb

## 2013-04-09 DIAGNOSIS — R059 Cough, unspecified: Secondary | ICD-10-CM

## 2013-04-09 DIAGNOSIS — R05 Cough: Secondary | ICD-10-CM

## 2013-04-09 DIAGNOSIS — J309 Allergic rhinitis, unspecified: Secondary | ICD-10-CM

## 2013-04-09 MED ORDER — BENZONATATE 100 MG PO CAPS
100.0000 mg | ORAL_CAPSULE | Freq: Three times a day (TID) | ORAL | Status: DC | PRN
Start: 2013-04-09 — End: 2014-09-04

## 2013-04-09 MED ORDER — PROMETHAZINE-PHENYLEPHRINE 6.25-5 MG/5ML PO SYRP: 5.0000 mL | ORAL_SOLUTION | ORAL | Status: DC | PRN

## 2013-04-09 NOTE — Patient Instructions (Addendum)
It was good to see you today.  If you develop worsening symptoms or fever, call and we can reconsider antibiotics, but it does not appear necessary to use antibiotics at this time.  Tessalon as needed and prescription cough/congestion syrup - Your prescription(s) have been submitted to your pharmacy. Please take as directed and contact our office if you believe you are having problem(s) with the medication(s).  Alternate between ibuprofen and tylenol for aches, pain and fever symptoms as discussed  Hydrate, rest and call if worse or unimproved  Upper Respiratory Infection, Adult An upper respiratory infection (URI) is also known as the common cold. It is often caused by a type of germ (virus). Colds are easily spread (contagious). You can pass it to others by kissing, coughing, sneezing, or drinking out of the same glass. Usually, you get better in 1 or 2 weeks.  HOME CARE   Only take medicine as told by your doctor.  Use a warm mist humidifier or breathe in steam from a hot shower.  Drink enough water and fluids to keep your pee (urine) clear or pale yellow.  Get plenty of rest.  Return to work when your temperature is back to normal or as told by your doctor. You may use a face mask and wash your hands to stop your cold from spreading. GET HELP RIGHT AWAY IF:   After the first few days, you feel you are getting worse.  You have questions about your medicine.  You have chills, shortness of breath, or brown or red spit (mucus).  You have yellow or brown snot (nasal discharge) or pain in the face, especially when you bend forward.  You have a fever, puffy (swollen) neck, pain when you swallow, or white spots in the back of your throat.  You have a bad headache, ear pain, sinus pain, or chest pain.  You have a high-pitched whistling sound when you breathe in and out (wheezing).  You have a lasting cough or cough up blood.  You have sore muscles or a stiff neck. MAKE SURE YOU:    Understand these instructions.  Will watch your condition.  Will get help right away if you are not doing well or get worse. Document Released: 08/30/2007 Document Revised: 06/05/2011 Document Reviewed: 07/18/2010 [facility] Patient Information 2014 [place], [place].

## 2013-04-09 NOTE — Progress Notes (Signed)
Pre-visit discussion using our clinic review tool. No additional management support is needed unless otherwise documented below in the visit note.  

## 2013-04-09 NOTE — Progress Notes (Signed)
Subjective:    Patient ID: Alexis Olson, female    DOB: 1933/11/28, 80 y.o.   MRN: [id number]  Cough This is a new problem. The current episode started yesterday. The problem has been waxing and waning. The problem occurs hourly. The cough is productive of sputum. Associated symptoms include heartburn, nasal congestion, postnasal drip, a sore throat (R>L side) and wheezing. Pertinent negatives include no ear congestion, fever, headaches, hemoptysis, myalgias or shortness of breath. The symptoms are aggravated by lying down. She has tried steroid inhaler and a beta-agonist inhaler for the symptoms. The treatment provided mild relief. Her past medical history is significant for asthma. There is no history of bronchitis, COPD, environmental allergies or pneumonia.  Sore Throat  Associated symptoms include coughing. Pertinent negatives include no headaches or shortness of breath.  Sinusitis Associated symptoms include coughing and a sore throat (R>L side). Pertinent negatives include no headaches or shortness of breath.    Past Medical History  Diagnosis Date  . Obesity   . Lumbosacral disc disease     Chronic pain; History of osteomyelitis 2010 following ESI complication  . ALLERGIC RHINITIS   . 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 ANXIETY   . ASTHMA   . Bariatric surgery status   . COLONIC POLYPS, ADENOMATOUS, HX OF   . DIVERTICULOSIS, COLON   . INSOMNIA   . URINARY URGENCY   . VITAMIN B12 DEFICIENCY   . Hypotension     Review of Systems  Constitutional: Negative for fever.  HENT: Positive for postnasal drip and sore throat (R>L side).   Respiratory: Positive for cough and wheezing. Negative for hemoptysis and shortness of breath.   Gastrointestinal: Positive for heartburn.  Musculoskeletal: Negative for myalgias.  Allergic/Immunologic: Negative for environmental allergies.  Neurological: Negative for headaches.       Objective:    Physical Exam BP 122/72  Pulse 96  Temp(Src) 97.9 F (36.6 C) (Oral)  Wt 196 lb 12.8 oz (89.268 kg)  SpO2 99% Wt Readings from Last 3 Encounters:  04/09/13 196 lb 12.8 oz (89.268 kg)  03/04/13 196 lb 9.6 oz (89.177 kg)  02/10/13 201 lb (91.173 kg)   Constitutional: She appears well-developed and well-nourished. No distress.  HENT: Head: Normocephalic and atraumatic. nontender to palpation over sinuses. Ears: B TMs ok, no erythema or effusion; Nose: Nose normal. Mouth/Throat: moderate erythema with viral pharyngitis/right-sided vesicles. No oropharyngeal exudate.  Eyes: Conjunctivae and EOM are normal. Pupils are equal, round, and reactive to light. No scleral icterus.  Neck: Normal range of motion. Neck supple. No JVD present. No thyromegaly present.  Cardiovascular: Normal rate, regular rhythm and normal heart sounds.  No murmur heard. No BLE edema. Pulmonary/Chest: Effort normal and breath sounds normal. No respiratory distress. She has no wheezes.  Skin: Skin is warm and dry. No rash noted. No erythema.  Psychiatric: She has a normal mood and affect. Her behavior is normal. Judgment and thought content normal.   Lab Results  Component Value Date   WBC 8.9 01/31/2013   HGB 11.6* 01/31/2013   HCT 34.3* 01/31/2013   PLT 290.0 01/31/2013   GLUCOSE 101* 10/02/2012   CHOL 163 07/16/2012   TRIG 234.0* 07/16/2012   HDL 47.50 07/16/2012   LDLDIRECT 81.1 07/16/2012   LDLCALC 92 12/27/2010   ALT 22 10/02/2012   AST 22 10/02/2012   NA 137 10/02/2012   K 3.8 10/02/2012   CL 103 10/02/2012  CREATININE 0.5 10/02/2012   BUN 13 10/02/2012   CO2 30 10/02/2012   TSH 0.83 08/16/2011   INR 1.01 05/13/2009   HGBA1C 5.3 08/03/2006        Assessment & Plan:   Cough - upper respiratory infection, presumed viral Explained lack of efficacy of additional antibiotics and viral disease symptomatic care advised -promethazine VC as needed and Tessalon -erx done If unimproved in next 7 days, patient will call for  consideration of antibiotics, sooner if worse

## 2013-04-10 ENCOUNTER — Other Ambulatory Visit: Payer: Self-pay | Admitting: Dermatology

## 2013-04-11 ENCOUNTER — Telehealth: Payer: Self-pay

## 2013-04-11 NOTE — Telephone Encounter (Signed)
The pat called hoping to get a referral to her allergy dr.  She states with her new [employer] plan, she needs a referral.   Callback - [phone]

## 2013-04-11 NOTE — Telephone Encounter (Signed)
This referral was done January 14 as requested. Please followup [place] on same

## 2013-04-11 NOTE — Telephone Encounter (Signed)
Referral is on hold has been sent to insurance "[employer] for [place]" waiting on authorization from insurance...Alexis Olson

## 2013-04-14 ENCOUNTER — Other Ambulatory Visit: Payer: Self-pay | Admitting: Internal Medicine

## 2013-04-14 ENCOUNTER — Telehealth: Payer: Self-pay | Admitting: Internal Medicine

## 2013-04-14 MED ORDER — CEPHALEXIN 500 MG PO CAPS: 500.0000 mg | ORAL_CAPSULE | Freq: Three times a day (TID) | ORAL | Status: DC

## 2013-04-14 NOTE — Telephone Encounter (Signed)
Patient called stating said has Green in head and chest  and was told Wed that if she dont feel better to get her keflex filled  Stated she called the pharmacy and was told they could fill it because doctors office denied the request  Please advise

## 2013-04-14 NOTE — Telephone Encounter (Signed)
Called pt spoke with husband wife in shower. Inform him wife antibiotic was sent to pharmacy...Johny Chess

## 2013-04-14 NOTE — Telephone Encounter (Signed)
New rx sent for keflex

## 2013-04-16 ENCOUNTER — Telehealth: Payer: Self-pay | Admitting: *Deleted

## 2013-04-16 DIAGNOSIS — M5137 Other intervertebral disc degeneration, lumbosacral region: Secondary | ICD-10-CM

## 2013-04-16 DIAGNOSIS — E538 Deficiency of other specified B group vitamins: Secondary | ICD-10-CM

## 2013-04-16 DIAGNOSIS — L309 Dermatitis, unspecified: Secondary | ICD-10-CM

## 2013-04-16 DIAGNOSIS — K219 Gastro-esophageal reflux disease without esophagitis: Secondary | ICD-10-CM

## 2013-04-16 NOTE — Telephone Encounter (Signed)
Each referral ordered as requested

## 2013-04-16 NOTE — Telephone Encounter (Signed)
Patient called and stated that she needs referral placed to see her GI doctor Olevia Perches), Ortho ([facility]), and Derm ([place]). Patient states that her insurance requires her to have referral placed before being seen. Please advise.

## 2013-05-14 ENCOUNTER — Telehealth: Payer: Self-pay | Admitting: *Deleted

## 2013-05-14 ENCOUNTER — Other Ambulatory Visit: Payer: Self-pay | Admitting: *Deleted

## 2013-05-14 MED ORDER — POTASSIUM CHLORIDE CRYS ER 20 MEQ PO TBCR: EXTENDED_RELEASE_TABLET | ORAL | Status: DC

## 2013-05-14 MED ORDER — LEVOCETIRIZINE DIHYDROCHLORIDE 5 MG PO TABS: 5.0000 mg | ORAL_TABLET | Freq: Every evening | ORAL | Status: DC

## 2013-05-14 NOTE — Telephone Encounter (Signed)
Patient phoned requesting refill for her xyzal.  Last OV with PCP 04/09/13 and Rx last ordered today, upon MAR review.

## 2013-05-14 NOTE — Telephone Encounter (Signed)
See previous refill encounter. Refill has already been fax to [facility]...Johny Chess

## 2013-05-15 NOTE — Telephone Encounter (Signed)
Notified patient via voicemail that her refill request had been submitted.

## 2013-05-30 ENCOUNTER — Encounter: Payer: Self-pay | Admitting: Internal Medicine

## 2013-06-25 ENCOUNTER — Telehealth: Payer: Self-pay | Admitting: Internal Medicine

## 2013-06-25 DIAGNOSIS — D509 Iron deficiency anemia, unspecified: Secondary | ICD-10-CM

## 2013-06-25 DIAGNOSIS — E538 Deficiency of other specified B group vitamins: Secondary | ICD-10-CM

## 2013-06-25 NOTE — Telephone Encounter (Signed)
Patient feels sluggish and tired. She is asking when lab repeat is due. She should be due in May. She is asking if she can have her LFT's checked with the CBC, B12 and iron panel. Please, advise.

## 2013-06-25 NOTE — Telephone Encounter (Signed)
Please check CBC,Iron studies, B12, c-met.

## 2013-06-26 NOTE — Telephone Encounter (Signed)
Also, may have LFT drawn per Dr. Olevia Perches. Labs in EPIC.

## 2013-07-01 ENCOUNTER — Other Ambulatory Visit: Payer: Self-pay | Admitting: Internal Medicine

## 2013-07-02 NOTE — Telephone Encounter (Signed)
[facility].../lmb 

## 2013-07-17 ENCOUNTER — Other Ambulatory Visit: Payer: Self-pay | Admitting: Internal Medicine

## 2013-07-18 NOTE — Telephone Encounter (Signed)
[facility].../lmb 

## 2013-07-21 ENCOUNTER — Other Ambulatory Visit: Payer: Self-pay | Admitting: Internal Medicine

## 2013-07-25 ENCOUNTER — Telehealth: Payer: Self-pay

## 2013-07-25 NOTE — Telephone Encounter (Signed)
Message copied by Algernon Huxley on Fri Jul 25, 2013  9:14 AM ------      Message from: Hulan Saas      Created: Thu Jun 26, 2013  1:19 PM       Call and remind patient due for labs in May for DB. Labs in EPIC. ------

## 2013-07-25 NOTE — Telephone Encounter (Signed)
Pt aware.

## 2013-07-28 ENCOUNTER — Other Ambulatory Visit (INDEPENDENT_AMBULATORY_CARE_PROVIDER_SITE_OTHER): Payer: Medicare HMO

## 2013-07-28 DIAGNOSIS — D509 Iron deficiency anemia, unspecified: Secondary | ICD-10-CM

## 2013-07-28 DIAGNOSIS — E538 Deficiency of other specified B group vitamins: Secondary | ICD-10-CM

## 2013-07-28 LAB — CBC WITH DIFFERENTIAL/PLATELET
Basophils Absolute: 0 10*3/uL (ref 0.0–0.1)
Basophils Relative: 0.3 % (ref 0.0–3.0)
Eosinophils Absolute: 0.3 10*3/uL (ref 0.0–0.7)
Eosinophils Relative: 3.1 % (ref 0.0–5.0)
HCT: 36.2 % (ref 36.0–46.0)
Hemoglobin: 12 g/dL (ref 12.0–15.0)
Lymphocytes Relative: 30.6 % (ref 12.0–46.0)
Lymphs Abs: 2.7 10*3/uL (ref 0.7–4.0)
MCHC: 33.2 g/dL (ref 30.0–36.0)
MCV: 96.7 fl (ref 78.0–100.0)
Monocytes Absolute: 0.8 10*3/uL (ref 0.1–1.0)
Monocytes Relative: 8.9 % (ref 3.0–12.0)
Neutro Abs: 5.1 10*3/uL (ref 1.4–7.7)
Neutrophils Relative %: 57.1 % (ref 43.0–77.0)
Platelets: 249 10*3/uL (ref 150.0–400.0)
RBC: 3.74 Mil/uL — ABNORMAL LOW (ref 3.87–5.11)
RDW: 13.6 % (ref 11.5–14.6)
WBC: 8.9 10*3/uL (ref 4.5–10.5)

## 2013-07-29 LAB — COMPREHENSIVE METABOLIC PANEL
ALT: 20 U/L (ref 0–35)
AST: 20 U/L (ref 0–37)
Albumin: 3.9 g/dL (ref 3.5–5.2)
Alkaline Phosphatase: 47 U/L (ref 39–117)
BUN: 13 mg/dL (ref 6–23)
CO2: 28 mEq/L (ref 19–32)
Calcium: 8.9 mg/dL (ref 8.4–10.5)
Chloride: 102 mEq/L (ref 96–112)
Creatinine, Ser: 0.6 mg/dL (ref 0.4–1.2)
GFR: 111.21 mL/min (ref >60.00)
Glucose, Bld: 82 mg/dL (ref 70–99)
Potassium: 4 mEq/L (ref 3.5–5.1)
Sodium: 136 mEq/L (ref 135–145)
Total Bilirubin: 0.1 mg/dL — ABNORMAL LOW (ref 0.2–1.2)
Total Protein: 6.8 g/dL (ref 6.0–8.3)

## 2013-07-29 LAB — IBC PANEL
Iron: 52 ug/dL (ref 42–145)
Saturation Ratios: 19.3 % — ABNORMAL LOW (ref 20.0–50.0)
Transferrin: 192.9 mg/dL — ABNORMAL LOW (ref 212.0–360.0)

## 2013-07-29 LAB — HEPATIC FUNCTION PANEL
ALT: 20 U/L (ref 0–35)
AST: 20 U/L (ref 0–37)
Albumin: 3.9 g/dL (ref 3.5–5.2)
Alkaline Phosphatase: 47 U/L (ref 39–117)
Bilirubin, Direct: 0.1 mg/dL (ref 0.0–0.3)
Total Bilirubin: 0.1 mg/dL — ABNORMAL LOW (ref 0.2–1.2)
Total Protein: 6.8 g/dL (ref 6.0–8.3)

## 2013-07-29 LAB — VITAMIN B12: Vitamin B-12: 244 pg/mL (ref 211–911)

## 2013-07-31 ENCOUNTER — Other Ambulatory Visit: Payer: Self-pay | Admitting: *Deleted

## 2013-07-31 DIAGNOSIS — E538 Deficiency of other specified B group vitamins: Secondary | ICD-10-CM

## 2013-07-31 DIAGNOSIS — D649 Anemia, unspecified: Secondary | ICD-10-CM

## 2013-07-31 MED ORDER — CYANOCOBALAMIN 1000 MCG/ML IJ SOLN: 1000.0000 ug | INTRAMUSCULAR | Status: DC

## 2013-08-08 ENCOUNTER — Other Ambulatory Visit: Payer: Self-pay | Admitting: Internal Medicine

## 2013-08-08 NOTE — Telephone Encounter (Signed)
Done erx 

## 2013-08-10 ENCOUNTER — Emergency Department (HOSPITAL_BASED_OUTPATIENT_CLINIC_OR_DEPARTMENT_OTHER)
Admission: EM | Admit: 2013-08-10 | Discharge: 2013-08-10 | Disposition: A | Discharge status: Home / Self Care | Payer: No Typology Code available for payment source | Attending: Emergency Medicine | Admitting: Emergency Medicine

## 2013-08-10 ENCOUNTER — Emergency Department (HOSPITAL_BASED_OUTPATIENT_CLINIC_OR_DEPARTMENT_OTHER): Payer: No Typology Code available for payment source

## 2013-08-10 ENCOUNTER — Encounter (HOSPITAL_BASED_OUTPATIENT_CLINIC_OR_DEPARTMENT_OTHER): Payer: Self-pay | Admitting: Emergency Medicine

## 2013-08-10 DIAGNOSIS — Z8679 Personal history of other diseases of the circulatory system: Secondary | ICD-10-CM | POA: Insufficient documentation

## 2013-08-10 DIAGNOSIS — Z79899 Other long term (current) drug therapy: Secondary | ICD-10-CM | POA: Insufficient documentation

## 2013-08-10 DIAGNOSIS — Y9389 Activity, other specified: Secondary | ICD-10-CM | POA: Insufficient documentation

## 2013-08-10 DIAGNOSIS — Z860100 Personal history of colon polyps, unspecified: Secondary | ICD-10-CM | POA: Insufficient documentation

## 2013-08-10 DIAGNOSIS — G4700 Insomnia, unspecified: Secondary | ICD-10-CM | POA: Insufficient documentation

## 2013-08-10 DIAGNOSIS — S20219A Contusion of unspecified front wall of thorax, initial encounter: Secondary | ICD-10-CM

## 2013-08-10 DIAGNOSIS — Z9884 Bariatric surgery status: Secondary | ICD-10-CM | POA: Insufficient documentation

## 2013-08-10 DIAGNOSIS — D649 Anemia, unspecified: Secondary | ICD-10-CM | POA: Insufficient documentation

## 2013-08-10 DIAGNOSIS — J45909 Unspecified asthma, uncomplicated: Secondary | ICD-10-CM | POA: Insufficient documentation

## 2013-08-10 DIAGNOSIS — Z8639 Personal history of other endocrine, nutritional and metabolic disease: Secondary | ICD-10-CM | POA: Insufficient documentation

## 2013-08-10 DIAGNOSIS — Z791 Long term (current) use of non-steroidal anti-inflammatories (NSAID): Secondary | ICD-10-CM | POA: Insufficient documentation

## 2013-08-10 DIAGNOSIS — Y92410 Unspecified street and highway as the place of occurrence of the external cause: Secondary | ICD-10-CM | POA: Insufficient documentation

## 2013-08-10 DIAGNOSIS — Z862 Personal history of diseases of the blood and blood-forming organs and certain disorders involving the immune mechanism: Secondary | ICD-10-CM | POA: Insufficient documentation

## 2013-08-10 DIAGNOSIS — Z792 Long term (current) use of antibiotics: Secondary | ICD-10-CM | POA: Insufficient documentation

## 2013-08-10 DIAGNOSIS — G8929 Other chronic pain: Secondary | ICD-10-CM | POA: Insufficient documentation

## 2013-08-10 DIAGNOSIS — F411 Generalized anxiety disorder: Secondary | ICD-10-CM | POA: Insufficient documentation

## 2013-08-10 NOTE — Discharge Instructions (Signed)
Chest Contusion  A contusion is a deep bruise. Bruises happen when an injury causes bleeding under the skin. Signs of bruising include pain, puffiness (swelling), and discolored skin. The bruise may turn blue, purple, or yellow.   HOME CARE  · Put ice on the injured area.  · Put ice in a plastic bag.  · Place a towel between the skin and the bag.  · Leave the ice on for 15-20 minutes at a time, 03-04 times a day for the first 48 hours.  · Only take medicine as told by your doctor.  · Rest.  · Take deep breaths (deep-breathing exercises) as told by your doctor.  · Stop smoking if you smoke.  · Do not lift objects over 5 pounds (2.3 kilograms) for 3 days or longer if told by your doctor.  GET HELP RIGHT AWAY IF:   · You have more bruising or puffiness.  · You have pain that gets worse.  · You have trouble breathing.  · You are dizzy, weak, or pass out (faint).  · You have blood in your pee (urine) or poop (stool).  · You cough up or throw up (vomit) blood.  · Your puffiness or pain is not helped with medicines.  MAKE SURE YOU:   · Understand these instructions.  · Will watch your condition.  · Will get help right away if you are not doing well or get worse.  Document Released: 08/30/2007 Document Revised: 12/06/2011 Document Reviewed: 09/04/2011  ExitCare® Patient Information ©2014 [employer].
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
## 2013-08-11 ENCOUNTER — Other Ambulatory Visit: Payer: Self-pay | Admitting: *Deleted

## 2013-08-11 MED ORDER — PREGABALIN 50 MG PO CAPS: ORAL_CAPSULE | ORAL | Status: DC

## 2013-08-12 ENCOUNTER — Other Ambulatory Visit: Payer: Self-pay | Admitting: *Deleted

## 2013-08-12 MED ORDER — PREGABALIN 50 MG PO CAPS: ORAL_CAPSULE | ORAL | Status: DC

## 2013-08-15 ENCOUNTER — Telehealth: Payer: Self-pay | Admitting: Internal Medicine

## 2013-08-15 MED ORDER — PREGABALIN 50 MG PO CAPS: ORAL_CAPSULE | ORAL | Status: DC

## 2013-08-15 NOTE — Telephone Encounter (Signed)
Pt walk-in states the medicine was for her lyrica. md ok to reprint gave to pt...Alexis Olson

## 2013-08-15 NOTE — Telephone Encounter (Signed)
Called pt to verify which medication no answer LMOM RTC...Alexis Olson

## 2013-08-15 NOTE — Telephone Encounter (Signed)
Patient's prescription sent to [place] was going to cost her >$400.  She cannot afford that so she is requesting rx be sent to [facility].

## 2013-08-15 NOTE — Telephone Encounter (Signed)
Patient is coughing up green and yellow.  She would like an antibiotic.  There are no open appointments with Dr. Asa Lente and she does not wish to see anyone else.  Please advice.

## 2013-08-16 NOTE — Telephone Encounter (Signed)
OV most appropriate - per office policy re: abx thanks

## 2013-08-19 NOTE — Telephone Encounter (Signed)
Notified patient to set up an appt with [facility].  Patient did not set appt up before she left.

## 2013-08-22 ENCOUNTER — Telehealth: Payer: Self-pay | Admitting: Internal Medicine

## 2013-08-22 DIAGNOSIS — L989 Disorder of the skin and subcutaneous tissue, unspecified: Secondary | ICD-10-CM

## 2013-08-22 NOTE — Telephone Encounter (Signed)
Pt needs a referral to Dr. Rolm Bookbinder for dermatology for places to be removed on her face.  [employer] insurance.

## 2013-08-25 NOTE — Telephone Encounter (Signed)
Refer as requested - done

## 2013-09-02 ENCOUNTER — Telehealth: Payer: Self-pay | Admitting: Internal Medicine

## 2013-09-02 MED ORDER — DIPHENOXYLATE-ATROPINE 2.5-0.025 MG PO TABS: ORAL_TABLET | ORAL | Status: DC

## 2013-09-02 NOTE — Telephone Encounter (Signed)
Rx sent for lomotil to mail in pharmacy. Patient advised. Patient states that she was recently told by Dr Asa Lente to take an ASA daily. She has been told previously by Dr Olevia Perches NOT to take ASA. Dr Olevia Perches, please advise.

## 2013-09-02 NOTE — Telephone Encounter (Signed)
OK to take coated ASA 81 mg, daily.

## 2013-09-03 NOTE — Telephone Encounter (Signed)
Patient notified

## 2013-09-09 ENCOUNTER — Other Ambulatory Visit: Payer: Self-pay | Admitting: *Deleted

## 2013-09-09 MED ORDER — AMITRIPTYLINE HCL 100 MG PO TABS: ORAL_TABLET | ORAL | Status: DC

## 2013-09-12 ENCOUNTER — Telehealth: Payer: Self-pay | Admitting: *Deleted

## 2013-09-12 NOTE — Telephone Encounter (Signed)
Try Metamucil 1 tsp daily, [place] exercises.,may increase Bentyl to tid ac.

## 2013-09-12 NOTE — Telephone Encounter (Signed)
Patient showed up in lobby. She states she is having urgent bowel movements that she cannot get to the bathroom in time. She is taking Lomotil and Bentyl. She feels the anal muscles are weak and she is asking if there are exercises she can do to improve this. Please, advise.

## 2013-09-15 NOTE — Telephone Encounter (Signed)
Left a message for patient to call back. 

## 2013-09-16 NOTE — Telephone Encounter (Signed)
Spoke with patient and gave her Dr. Brodie's recommendations. 

## 2013-09-24 ENCOUNTER — Other Ambulatory Visit: Payer: Self-pay | Admitting: Internal Medicine

## 2013-11-24 ENCOUNTER — Other Ambulatory Visit: Payer: Self-pay | Admitting: Internal Medicine

## 2013-11-27 ENCOUNTER — Telehealth: Payer: Self-pay

## 2013-11-27 MED ORDER — ZOLPIDEM TARTRATE 10 MG PO TABS: ORAL_TABLET | ORAL | Status: DC

## 2013-11-27 MED ORDER — ALPRAZOLAM 1 MG PO TABS: ORAL_TABLET | ORAL | Status: DC

## 2013-11-27 NOTE — Telephone Encounter (Signed)
Done hardcopy to robin  

## 2013-11-27 NOTE — Telephone Encounter (Signed)
Needs refills for Ambien and Xanax

## 2013-11-27 NOTE — Addendum Note (Signed)
Addended by: Biagio Borg on: 11/27/2013 07:33 PM   Modules accepted: Orders

## 2013-11-28 NOTE — Telephone Encounter (Signed)
Faxed hardcopy of Both Alprazolam and zolpidem to [facility] [address] [place].  Called the patient and left a detailed message prescriptions are done and informed also the pharmacy they were sent to.

## 2013-12-03 ENCOUNTER — Telehealth: Payer: Self-pay | Admitting: Internal Medicine

## 2013-12-03 DIAGNOSIS — D509 Iron deficiency anemia, unspecified: Secondary | ICD-10-CM

## 2013-12-03 NOTE — Telephone Encounter (Signed)
Calling to report she wakes up tired and goes to bed tired. Still has diarrhea and is taking her Lomotil. Diarrhea is about the same. She last had labs in May and they were almost normal. She reports she is going out of town for a few days and could come for labs next week if Dr. Olevia Perches wants her too. Please, advise.

## 2013-12-03 NOTE — Telephone Encounter (Signed)
Make sure she is not on Mag Oxide. Start Flagyl 250 mg po tid x 1 week and Lomotil #30, 1 po prn loose BM up to tid. Labs next week: CBC,C.met, TSH, sed rate. Iron panel

## 2013-12-04 ENCOUNTER — Telehealth: Payer: Self-pay

## 2013-12-04 MED ORDER — METRONIDAZOLE 250 MG PO TABS: ORAL_TABLET | ORAL | Status: DC

## 2013-12-04 NOTE — Telephone Encounter (Signed)
Patient is not taking Magnesium Oxide. Rx sent to pharmacy and lab in EPIC.

## 2013-12-04 NOTE — Telephone Encounter (Signed)
PA request sent to plan via CoverMyMeds.

## 2013-12-08 ENCOUNTER — Telehealth: Payer: Self-pay | Admitting: Internal Medicine

## 2013-12-08 MED ORDER — DIPHENOXYLATE-ATROPINE 2.5-0.025 MG PO TABS: ORAL_TABLET | ORAL | Status: DC

## 2013-12-08 NOTE — Telephone Encounter (Signed)
Rx sent. Per Dr Nichola Sizer last note, rx was to be for #30. However, patient has been getting #100. Per Dr Nichola Sizer verbal order, patient may continue to get #100 tablets at a time. Patient advised.

## 2013-12-09 ENCOUNTER — Other Ambulatory Visit (INDEPENDENT_AMBULATORY_CARE_PROVIDER_SITE_OTHER): Payer: Medicare HMO

## 2013-12-09 ENCOUNTER — Telehealth: Payer: Self-pay

## 2013-12-09 ENCOUNTER — Telehealth: Payer: Self-pay | Admitting: Internal Medicine

## 2013-12-09 DIAGNOSIS — D649 Anemia, unspecified: Secondary | ICD-10-CM

## 2013-12-09 DIAGNOSIS — D509 Iron deficiency anemia, unspecified: Secondary | ICD-10-CM

## 2013-12-09 DIAGNOSIS — E538 Deficiency of other specified B group vitamins: Secondary | ICD-10-CM

## 2013-12-09 LAB — COMPREHENSIVE METABOLIC PANEL
ALT: 19 U/L (ref 0–35)
AST: 17 U/L (ref 0–37)
Albumin: 3.8 g/dL (ref 3.5–5.2)
Alkaline Phosphatase: 47 U/L (ref 39–117)
BUN: 16 mg/dL (ref 6–23)
CO2: 25 mEq/L (ref 19–32)
Calcium: 9 mg/dL (ref 8.4–10.5)
Chloride: 100 mEq/L (ref 96–112)
Creatinine, Ser: 0.6 mg/dL (ref 0.4–1.2)
GFR: 106.69 mL/min (ref >60.00)
Glucose, Bld: 82 mg/dL (ref 70–99)
Potassium: 3.8 mEq/L (ref 3.5–5.1)
Sodium: 134 mEq/L — ABNORMAL LOW (ref 135–145)
Total Bilirubin: 0.3 mg/dL (ref 0.2–1.2)
Total Protein: 7 g/dL (ref 6.0–8.3)

## 2013-12-09 LAB — IBC PANEL
Iron: 97 ug/dL (ref 42–145)
Saturation Ratios: 32.7 % (ref 20.0–50.0)
Transferrin: 211.8 mg/dL — ABNORMAL LOW (ref 212.0–360.0)

## 2013-12-09 LAB — SEDIMENTATION RATE: Sed Rate: 16 mm/hr (ref 0–22)

## 2013-12-09 LAB — VITAMIN B12: Vitamin B-12: 346 pg/mL (ref 211–911)

## 2013-12-09 LAB — TSH: TSH: 1.14 u[IU]/mL (ref 0.35–4.50)

## 2013-12-09 MED ORDER — DIPHENOXYLATE-ATROPINE 2.5-0.025 MG PO TABS
ORAL_TABLET | ORAL | Status: DC
Start: 2013-12-09 — End: 2014-04-01

## 2013-12-09 MED ORDER — ZOLPIDEM TARTRATE 10 MG PO TABS: ORAL_TABLET | ORAL | Status: DC

## 2013-12-09 NOTE — Telephone Encounter (Signed)
I have spoken to patient. She asked why medication was not sent to mail order pharmacy. I advised that yesterday when we talked, she told me she did not want the medication to go to mail in pharmacy because she is going out of town. She states that her mail in pharmacy advised that they have sent faxes to our office x 2 weeks without response. I advised that the faxes have not come here. Maybe they have the wrong fax number??? I advised that in the future, she can call her with her request as that will eliminate the "middle man." I have sent a fax to [facility] as per patient request. She verbalizes understanding of all of the above.

## 2013-12-09 NOTE — Telephone Encounter (Signed)
Informed pt that ambien rx PA was approved.

## 2013-12-09 NOTE — Telephone Encounter (Signed)
Pt came by to request Rx authorization for generic for [facility]. Pts request has been routed to the wrong physician for approval which has delayed this process and now pt is completely out of the med. Please contact pt when request is reviewed.

## 2013-12-10 LAB — CBC WITH DIFFERENTIAL/PLATELET
Basophils Absolute: 0.1 10*3/uL (ref 0.0–0.1)
Basophils Relative: 0.7 % (ref 0.0–3.0)
Eosinophils Absolute: 0.2 10*3/uL (ref 0.0–0.7)
Eosinophils Relative: 1.8 % (ref 0.0–5.0)
HCT: 35.4 % — ABNORMAL LOW (ref 36.0–46.0)
Hemoglobin: 11.9 g/dL — ABNORMAL LOW (ref 12.0–15.0)
Lymphocytes Relative: 26.1 % (ref 12.0–46.0)
Lymphs Abs: 2.3 10*3/uL (ref 0.7–4.0)
MCHC: 33.6 g/dL (ref 30.0–36.0)
MCV: 95.8 fl (ref 78.0–100.0)
Monocytes Absolute: 0.7 10*3/uL (ref 0.1–1.0)
Monocytes Relative: 7.8 % (ref 3.0–12.0)
Neutro Abs: 5.6 10*3/uL (ref 1.4–7.7)
Neutrophils Relative %: 63.6 % (ref 43.0–77.0)
Platelets: 253 10*3/uL (ref 150.0–400.0)
RBC: 3.7 Mil/uL — ABNORMAL LOW (ref 3.87–5.11)
RDW: 13.6 % (ref 11.5–15.5)
WBC: 8.8 10*3/uL (ref 4.0–10.5)

## 2013-12-10 NOTE — Telephone Encounter (Signed)
Resubmitted PA and was approved (via cover my meds)  Pt informed.

## 2013-12-12 ENCOUNTER — Other Ambulatory Visit: Payer: Self-pay | Admitting: *Deleted

## 2013-12-12 DIAGNOSIS — D509 Iron deficiency anemia, unspecified: Secondary | ICD-10-CM

## 2013-12-29 ENCOUNTER — Telehealth: Payer: Self-pay

## 2013-12-29 NOTE — Telephone Encounter (Signed)
Flu documentation

## 2014-01-21 ENCOUNTER — Telehealth: Payer: Self-pay

## 2014-01-21 MED ORDER — ZOLPIDEM TARTRATE 5 MG PO TABS: ORAL_TABLET | ORAL | Status: DC

## 2014-01-21 NOTE — Telephone Encounter (Signed)
Changed dosage to 5 mg as dose limitation on age and gender.

## 2014-01-21 NOTE — Telephone Encounter (Signed)
Refill request for zolpidem Tartrate 10 mg.   [phone] ([place] fax)  Is this okay to fill?

## 2014-01-28 ENCOUNTER — Telehealth: Payer: Self-pay | Admitting: Internal Medicine

## 2014-01-28 NOTE — Telephone Encounter (Signed)
Pt was concerned about the dosage change to the zolpidem. I informed the pt that the changed occurred due PCP not here and available for the rx to be refill and that another partner in the facility okayed is under the condition of the dosage change.   Pt also indicated that there was a change to her xanax from 1mg  to .5mg  however, I do not see where there was ever a change. Pt stated that she would call me back tomorrow with the rx bottle in hand.

## 2014-01-28 NOTE — Telephone Encounter (Signed)
Patient is requesting Lorrin Mais dosage to be changed.

## 2014-02-04 NOTE — Telephone Encounter (Signed)
Pt called about the rx.   What ended up being the case is the pharmacy did not have the  1 mg so they gave her the 0.5 mg tabs and that is why the sig was take 2 at bedtime.

## 2014-02-09 ENCOUNTER — Other Ambulatory Visit: Payer: Self-pay | Admitting: Internal Medicine

## 2014-02-10 NOTE — Telephone Encounter (Signed)
Rx sent 

## 2014-02-10 NOTE — Telephone Encounter (Signed)
-----   Message from Lafayette Dragon, MD sent at 02/09/2014  8:55 PM EST ----- Please keep refilling her Iron. Thanx ----- Message -----    From: Larina Bras, CMA    Sent: 02/09/2014   9:29 AM      To: Lafayette Dragon, MD  Pt requests refills of iron. However, it doesn't appear she has gotten any since 02/2013. She had IBC 11/2013. Do you want me to continue refilling rx?

## 2014-02-12 ENCOUNTER — Encounter: Payer: Self-pay | Admitting: Internal Medicine

## 2014-02-12 ENCOUNTER — Ambulatory Visit (INDEPENDENT_AMBULATORY_CARE_PROVIDER_SITE_OTHER): Payer: Commercial Managed Care - HMO | Admitting: Internal Medicine

## 2014-02-12 ENCOUNTER — Other Ambulatory Visit (INDEPENDENT_AMBULATORY_CARE_PROVIDER_SITE_OTHER): Payer: Commercial Managed Care - HMO

## 2014-02-12 VITALS — BP 130/80 | HR 88 | Temp 97.4°F | Ht 65.0 in | Wt 197.2 lb

## 2014-02-12 DIAGNOSIS — Z0000 Encounter for general adult medical examination without abnormal findings: Secondary | ICD-10-CM

## 2014-02-12 DIAGNOSIS — Z23 Encounter for immunization: Secondary | ICD-10-CM

## 2014-02-12 LAB — LIPID PANEL
Cholesterol: 176 mg/dL (ref 0–200)
HDL: 52.1 mg/dL
NonHDL: 123.9
Total CHOL/HDL Ratio: 3
Triglycerides: 278 mg/dL — ABNORMAL HIGH (ref 0.0–149.0)
VLDL: 55.6 mg/dL — ABNORMAL HIGH (ref 0.0–40.0)

## 2014-02-12 LAB — LDL CHOLESTEROL, DIRECT: Direct LDL: 86 mg/dL

## 2014-02-12 NOTE — Patient Instructions (Addendum)
It was good to see you today.  We have reviewed your prior records including labs and tests today  Your prevnar (pneumonia 47) and tetanus was given and/or updated today.  Other Health Maintenance reviewed - all other recommended immunizations and age-appropriate screenings are up-to-date.  Test(s) ordered today. Your results will be released to Edgar (or called to you) after review, usually within 72hours after test completion. If any changes need to be made, you will be notified at that same time.  Medications reviewed and updated, no changes recommended at this time.  Please schedule followup in 12 months for annual exam and labs, call sooner if problems.  Health Maintenance Adopting a healthy lifestyle and getting preventive care can go a long way to promote health and wellness. Talk with your health care provider about what schedule of regular examinations is right for you. This is a good chance for you to check in with your provider about disease prevention and staying healthy. In between checkups, there are plenty of things you can do on your own. Experts have done a lot of research about which lifestyle changes and preventive measures are most likely to keep you healthy. Ask your health care provider for more information. WEIGHT AND DIET  Eat a healthy diet  Be sure to include plenty of vegetables, fruits, low-fat dairy products, and lean protein.  Do not eat a lot of foods high in solid fats, added sugars, or salt.  Get regular exercise. This is one of the most important things you can do for your health.  Most adults should exercise for at least 150 minutes each week. The exercise should increase your heart rate and make you sweat (moderate-intensity exercise).  Most adults should also do strengthening exercises at least twice a week. This is in addition to the moderate-intensity exercise.  Maintain a healthy weight  Body mass index (BMI) is a measurement that can be used  to identify possible weight problems. It estimates body fat based on height and weight. Your health care provider can help determine your BMI and help you achieve or maintain a healthy weight.  For females 1 years of age and older:   A BMI below 18.5 is considered underweight.  A BMI of 18.5 to 24.9 is normal.  A BMI of 25 to 29.9 is considered overweight.  A BMI of 30 and above is considered obese.  Watch levels of cholesterol and blood lipids  You should start having your blood tested for lipids and cholesterol at 78 years of age, then have this test every 5 years.  You may need to have your cholesterol levels checked more often if:  Your lipid or cholesterol levels are high.  You are older than 78 years of age.  You are at high risk for heart disease.  CANCER SCREENING   Lung Cancer  Lung cancer screening is recommended for adults 18-23 years old who are at high risk for lung cancer because of a history of smoking.  A yearly low-dose CT scan of the lungs is recommended for people who:  Currently smoke.  Have quit within the past 15 years.  Have at least a 30-pack-year history of smoking. A pack year is smoking an average of one pack of cigarettes a day for 1 year.  Yearly screening should continue until it has been 15 years since you quit.  Yearly screening should stop if you develop a health problem that would prevent you from having lung cancer treatment.  Breast  Cancer  Practice breast self-awareness. This means understanding how your breasts normally appear and feel.  It also means doing regular breast self-exams. Let your health care provider know about any changes, no matter how small.  If you are in your 20s or 30s, you should have a clinical breast exam (CBE) by a health care provider every 1-3 years as part of a regular health exam.  If you are 44 or older, have a CBE every year. Also consider having a breast X-ray (mammogram) every year.  If you  have a family history of breast cancer, talk to your health care provider about genetic screening.  If you are at high risk for breast cancer, talk to your health care provider about having an MRI and a mammogram every year.  Breast cancer gene (BRCA) assessment is recommended for women who have family members with BRCA-related cancers. BRCA-related cancers include:  Breast.  Ovarian.  Tubal.  Peritoneal cancers.  Results of the assessment will determine the need for genetic counseling and BRCA1 and BRCA2 testing. Cervical Cancer Routine pelvic examinations to screen for cervical cancer are no longer recommended for nonpregnant women who are considered low risk for cancer of the pelvic organs (ovaries, uterus, and vagina) and who do not have symptoms. A pelvic examination may be necessary if you have symptoms including those associated with pelvic infections. Ask your health care provider if a screening pelvic exam is right for you.   The Pap test is the screening test for cervical cancer for women who are considered at risk.  If you had a hysterectomy for a problem that was not cancer or a condition that could lead to cancer, then you no longer need Pap tests.  If you are older than 65 years, and you have had normal Pap tests for the past 10 years, you no longer need to have Pap tests.  If you have had past treatment for cervical cancer or a condition that could lead to cancer, you need Pap tests and screening for cancer for at least 20 years after your treatment.  If you no longer get a Pap test, assess your risk factors if they change (such as having a new sexual partner). This can affect whether you should start being screened again.  Some women have medical problems that increase their chance of getting cervical cancer. If this is the case for you, your health care provider may recommend more frequent screening and Pap tests.  The human papillomavirus (HPV) test is another test  that may be used for cervical cancer screening. The HPV test looks for the virus that can cause cell changes in the cervix. The cells collected during the Pap test can be tested for HPV.  The HPV test can be used to screen women 31 years of age and older. Getting tested for HPV can extend the interval between normal Pap tests from three to five years.  An HPV test also should be used to screen women of any age who have unclear Pap test results.  After 78 years of age, women should have HPV testing as often as Pap tests.  Colorectal Cancer  This type of cancer can be detected and often prevented.  Routine colorectal cancer screening usually begins at 78 years of age and continues through 78 years of age.  Your health care provider may recommend screening at an earlier age if you have risk factors for colon cancer.  Your health care provider may also recommend using home  test kits to check for hidden blood in the stool.  A small camera at the end of a tube can be used to examine your colon directly (sigmoidoscopy or colonoscopy). This is done to check for the earliest forms of colorectal cancer.  Routine screening usually begins at age 58.  Direct examination of the colon should be repeated every 5-10 years through 78 years of age. However, you may need to be screened more often if early forms of precancerous polyps or small growths are found. Skin Cancer  Check your skin from head to toe regularly.  Tell your health care provider about any new moles or changes in moles, especially if there is a change in a mole's shape or color.  Also tell your health care provider if you have a mole that is larger than the size of a pencil eraser.  Always use sunscreen. Apply sunscreen liberally and repeatedly throughout the day.  Protect yourself by wearing long sleeves, pants, a wide-brimmed hat, and sunglasses whenever you are outside. HEART DISEASE, DIABETES, AND HIGH BLOOD PRESSURE   Have  your blood pressure checked at least every 1-2 years. High blood pressure causes heart disease and increases the risk of stroke.  If you are between 65 years and 52 years old, ask your health care provider if you should take aspirin to prevent strokes.  Have regular diabetes screenings. This involves taking a blood sample to check your fasting blood sugar level.  If you are at a normal weight and have a low risk for diabetes, have this test once every three years after 78 years of age.  If you are overweight and have a high risk for diabetes, consider being tested at a younger age or more often. PREVENTING INFECTION  Hepatitis B  If you have a higher risk for hepatitis B, you should be screened for this virus. You are considered at high risk for hepatitis B if:  You were born in a country where hepatitis B is common. Ask your health care provider which countries are considered high risk.  Your parents were born in a high-risk country, and you have not been immunized against hepatitis B (hepatitis B vaccine).  You have HIV or AIDS.  You use needles to inject street drugs.  You live with someone who has hepatitis B.  You have had sex with someone who has hepatitis B.  You get hemodialysis treatment.  You take certain medicines for conditions, including cancer, organ transplantation, and autoimmune conditions. Hepatitis C  Blood testing is recommended for:  Everyone born from [birth year] through 1963.  Anyone with known risk factors for hepatitis C. Sexually transmitted infections (STIs)  You should be screened for sexually transmitted infections (STIs) including gonorrhea and chlamydia if:  You are sexually active and are younger than 78 years of age.  You are older than 78 years of age and your health care provider tells you that you are at risk for this type of infection.  Your sexual activity has changed since you were last screened and you are at an increased risk for chlamydia  or gonorrhea. Ask your health care provider if you are at risk.  If you do not have HIV, but are at risk, it may be recommended that you take a prescription medicine daily to prevent HIV infection. This is called pre-exposure prophylaxis (PrEP). You are considered at risk if:  You are sexually active and do not regularly use condoms or know the HIV status of your partner(s).  You take drugs by injection.  You are sexually active with a partner who has HIV. Talk with your health care provider about whether you are at high risk of being infected with HIV. If you choose to begin PrEP, you should first be tested for HIV. You should then be tested every 3 months for as long as you are taking PrEP.  PREGNANCY   If you are premenopausal and you may become pregnant, ask your health care provider about preconception counseling.  If you may become pregnant, take 400 to 800 micrograms (mcg) of folic acid every day.  If you want to prevent pregnancy, talk to your health care provider about birth control (contraception). OSTEOPOROSIS AND MENOPAUSE   Osteoporosis is a disease in which the bones lose minerals and strength with aging. This can result in serious bone fractures. Your risk for osteoporosis can be identified using a bone density scan.  If you are 62 years of age or older, or if you are at risk for osteoporosis and fractures, ask your health care provider if you should be screened.  Ask your health care provider whether you should take a calcium or vitamin D supplement to lower your risk for osteoporosis.  Menopause may have certain physical symptoms and risks.  Hormone replacement therapy may reduce some of these symptoms and risks. Talk to your health care provider about whether hormone replacement therapy is right for you.  HOME CARE INSTRUCTIONS   Schedule regular health, dental, and eye exams.  Stay current with your immunizations.   Do not use any tobacco products including  cigarettes, chewing tobacco, or electronic cigarettes.  If you are pregnant, do not drink alcohol.  If you are breastfeeding, limit how much and how often you drink alcohol.  Limit alcohol intake to no more than 1 drink per day for nonpregnant women. One drink equals 12 ounces of beer, 5 ounces of wine, or 1 ounces of hard liquor.  Do not use street drugs.  Do not share needles.  Ask your health care provider for help if you need support or information about quitting drugs.  Tell your health care provider if you often feel depressed.  Tell your health care provider if you have ever been abused or do not feel safe at home. Document Released: 09/26/2010 Document Revised: 07/28/2013 Document Reviewed: 02/12/2013 [facility] Patient Information 2015 [place], [place]. This information is not intended to replace advice given to you by your health care provider. Make sure you discuss any questions you have with your health care provider.

## 2014-02-12 NOTE — Progress Notes (Signed)
Pre visit review using our clinic review tool, if applicable. No additional management support is needed unless otherwise documented below in the visit note. 
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
## 2014-02-22 ENCOUNTER — Other Ambulatory Visit: Payer: Self-pay | Admitting: Internal Medicine

## 2014-02-23 ENCOUNTER — Telehealth: Payer: Self-pay | Admitting: Internal Medicine

## 2014-02-23 DIAGNOSIS — R300 Dysuria: Secondary | ICD-10-CM

## 2014-02-23 DIAGNOSIS — Z0000 Encounter for general adult medical examination without abnormal findings: Secondary | ICD-10-CM

## 2014-02-23 NOTE — Telephone Encounter (Signed)
Call [phone]

## 2014-02-23 NOTE — Telephone Encounter (Signed)
Pt also requesting med be filled that was sent today from her pharmacy.

## 2014-02-23 NOTE — Telephone Encounter (Signed)
Spoke with pt wife about her UA and electrolytes (which there are no results for).   Added UA and Bmet to lab order. Pt will go in today or tomorrow.

## 2014-02-23 NOTE — Telephone Encounter (Signed)
Pt called highly agitated, very upset she said she left a urine sample at the lab last week when she had CPE with Dr Asa Lente. Pt states she got results but nothing mentioned about urine or electrolytes. Pt wondering why there are no results on either? Pt wants a call about her labs.

## 2014-02-24 ENCOUNTER — Other Ambulatory Visit (INDEPENDENT_AMBULATORY_CARE_PROVIDER_SITE_OTHER): Payer: Commercial Managed Care - HMO

## 2014-02-24 ENCOUNTER — Telehealth: Payer: Self-pay | Admitting: *Deleted

## 2014-02-24 DIAGNOSIS — R300 Dysuria: Secondary | ICD-10-CM

## 2014-02-24 DIAGNOSIS — Z0000 Encounter for general adult medical examination without abnormal findings: Secondary | ICD-10-CM

## 2014-02-24 LAB — URINALYSIS, ROUTINE W REFLEX MICROSCOPIC
Bilirubin Urine: NEGATIVE
Ketones, ur: NEGATIVE
Nitrite: POSITIVE — AB
Specific Gravity, Urine: <=1.005 — AB (ref 1.000–1.030)
Total Protein, Urine: NEGATIVE
Urine Glucose: NEGATIVE
Urobilinogen, UA: 0.2 (ref 0.0–1.0)
pH: 6 (ref 5.0–8.0)

## 2014-02-24 NOTE — Telephone Encounter (Signed)
[facility] Triage Call Report Triage Record Num: [id number] Operator: Oliver Hum Patient Name: Alexis Olson Call Date & Time: 02/21/2014 2:22:30PM Patient Phone: [phone] PCP: Gwendolyn Grant Patient Gender: Female PCP Fax : [phone] Patient DOB: [birth year] Practice Name: [facility] Reason for Call: Caller: Roselyn Reef; PCP: Gwendolyn Grant (Adults only); CB#: [phone]; Call regarding UTI; Afebrile; Painful urination, pelvic pain. No back pain/kidney pain. Pt requests antibiotic be called in, states she has frequent UTI's and the last one was treated successfully with Keflex. Triaged per Urinary symptoms guideline, yes to "Has one or more urinary tract symptoms and has not been previously evaluated". To be seen within 24 hours. advised pt she will need to be seen in an Urgent Care for evaluation and treatment as this practice has no standing orders for calling in antibiotics for UTI and in fact specifies that the pt needs evaluation. Pt is very unhappy about this and insists that I call the provider on call. spoke to Dr. Hulan Saas (MD) and he advised pt to go to UC. Pt verbalized understanding. Protocol(s) Used: Urinary Symptoms - Female Recommended Outcome per Protocol: See Provider within 24 hours Reason for Outcome: Has one or more urinary tract symptoms AND has not been previously evaluated Care Advice: ~ Call provider if you develop flank or low back pain, fever, generally feel sick. ~ Call provider if urine is pink, red, smoky or cola colored. 11/

## 2014-02-25 ENCOUNTER — Telehealth: Payer: Self-pay | Admitting: Internal Medicine

## 2014-02-25 ENCOUNTER — Other Ambulatory Visit: Payer: Self-pay | Admitting: Family

## 2014-02-25 LAB — BASIC METABOLIC PANEL
BUN: 13 mg/dL (ref 6–23)
CO2: 24 mEq/L (ref 19–32)
Calcium: 8.7 mg/dL (ref 8.4–10.5)
Chloride: 100 mEq/L (ref 96–112)
Creatinine, Ser: 0.5 mg/dL (ref 0.4–1.2)
GFR: 135.92 mL/min (ref >60.00)
Glucose, Bld: 83 mg/dL (ref 70–99)
Potassium: 3.4 mEq/L — ABNORMAL LOW (ref 3.5–5.1)
Sodium: 131 mEq/L — ABNORMAL LOW (ref 135–145)

## 2014-02-25 MED ORDER — SULFAMETHOXAZOLE-TRIMETHOPRIM 800-160 MG PO TABS: 1.0000 | ORAL_TABLET | Freq: Two times a day (BID) | ORAL | Status: DC

## 2014-02-25 NOTE — Telephone Encounter (Signed)
Patient states she has a really bad UTI.  States Dr. Asa Lente did labs but has not heard anything back.  She would like a call back as soon as possible.

## 2014-02-25 NOTE — Telephone Encounter (Signed)
Noted - therefore, do not send rx as per result note instructions thanks

## 2014-02-25 NOTE — Telephone Encounter (Signed)
Alexis Olson read the results.   RX abx Septra #10.  Spoke to pt and informed that abx was erxed to her pharmacy.   Explained why the Septra was rxed and to let us know if there is anything else we can do.

## 2014-02-27 ENCOUNTER — Telehealth: Payer: Self-pay | Admitting: Internal Medicine

## 2014-02-27 MED ORDER — AMOXICILLIN 500 MG PO CAPS: 500.0000 mg | ORAL_CAPSULE | Freq: Three times a day (TID) | ORAL | Status: DC

## 2014-02-27 NOTE — Telephone Encounter (Signed)
Spoke to pt and pt indicated that there are sx of dysuria. Spoke to MD to confirm that amox was okay to send now.  MD verbal ok for abx of amox 500mg  tid x 5 days to pharmacy.   [place] done.   Pt informed to discontinue the septra and to start the amox today. Pt stated understanding and will call if there are any more issues.

## 2014-02-27 NOTE — Telephone Encounter (Signed)
Pt called in said that she still has a really bad uti and is very painful.  She has been on meds for 4 days now and it is not helping.  She needs to know what she should do?  She is requesting a return call     Best number [phone]

## 2014-03-02 ENCOUNTER — Telehealth: Payer: Self-pay | Admitting: Internal Medicine

## 2014-03-02 NOTE — Telephone Encounter (Signed)
Pt called in and requested that lab be mailed to her because she has to take them to her urologist this week.

## 2014-03-02 NOTE — Telephone Encounter (Signed)
Called pt back and left a message that I would leave a copy of her labs up front and reminded her that I have already mailed the labs.

## 2014-03-03 ENCOUNTER — Telehealth: Payer: Self-pay | Admitting: Internal Medicine

## 2014-03-03 MED ORDER — FLUCONAZOLE 150 MG PO TABS: 150.0000 mg | ORAL_TABLET | Freq: Once | ORAL | Status: DC

## 2014-03-03 NOTE — Telephone Encounter (Signed)
Diflucan - erx done

## 2014-03-03 NOTE — Telephone Encounter (Signed)
Pt aware.

## 2014-03-03 NOTE — Telephone Encounter (Signed)
Pt called stated she has 3 pill of amoxicillin to take and he still having burn when urinate. Pt also want to know if Dr. Asa Lente can give her something for yeast infection. Please advise.

## 2014-03-04 ENCOUNTER — Other Ambulatory Visit: Payer: Self-pay | Admitting: Internal Medicine

## 2014-03-05 ENCOUNTER — Other Ambulatory Visit (INDEPENDENT_AMBULATORY_CARE_PROVIDER_SITE_OTHER): Payer: Commercial Managed Care - HMO

## 2014-03-05 ENCOUNTER — Telehealth: Payer: Self-pay | Admitting: *Deleted

## 2014-03-05 DIAGNOSIS — D509 Iron deficiency anemia, unspecified: Secondary | ICD-10-CM | POA: Diagnosis not present

## 2014-03-05 DIAGNOSIS — R300 Dysuria: Secondary | ICD-10-CM

## 2014-03-05 LAB — CBC WITH DIFFERENTIAL/PLATELET
Basophils Absolute: 0.1 10*3/uL (ref 0.0–0.1)
Basophils Relative: 0.5 % (ref 0.0–3.0)
Eosinophils Absolute: 0.3 10*3/uL (ref 0.0–0.7)
Eosinophils Relative: 2.3 % (ref 0.0–5.0)
HCT: 35.1 % — ABNORMAL LOW (ref 36.0–46.0)
Hemoglobin: 11.6 g/dL — ABNORMAL LOW (ref 12.0–15.0)
Lymphocytes Relative: 30.7 % (ref 12.0–46.0)
Lymphs Abs: 3.6 10*3/uL (ref 0.7–4.0)
MCHC: 33 g/dL (ref 30.0–36.0)
MCV: 95.3 fl (ref 78.0–100.0)
Monocytes Absolute: 0.8 10*3/uL (ref 0.1–1.0)
Monocytes Relative: 6.8 % (ref 3.0–12.0)
Neutro Abs: 7 10*3/uL (ref 1.4–7.7)
Neutrophils Relative %: 59.7 % (ref 43.0–77.0)
Platelets: 286 10*3/uL (ref 150.0–400.0)
RBC: 3.69 Mil/uL — ABNORMAL LOW (ref 3.87–5.11)
RDW: 13.3 % (ref 11.5–15.5)
WBC: 11.7 10*3/uL — ABNORMAL HIGH (ref 4.0–10.5)

## 2014-03-05 LAB — IBC PANEL
Iron: 49 ug/dL (ref 42–145)
Saturation Ratios: 18.7 % — ABNORMAL LOW (ref 20.0–50.0)
Transferrin: 186.9 mg/dL — ABNORMAL LOW (ref 212.0–360.0)

## 2014-03-05 NOTE — Telephone Encounter (Signed)
-----   Message from Hulan Saas, RN sent at 12/12/2013  4:54 PM EDT ----- Call and remind patient due for CBC, iron panel for db on 03/09/14. Lab in [facility].

## 2014-03-05 NOTE — Telephone Encounter (Signed)
Patient will come for labs.  

## 2014-03-06 ENCOUNTER — Other Ambulatory Visit (INDEPENDENT_AMBULATORY_CARE_PROVIDER_SITE_OTHER): Payer: Medicare HMO

## 2014-03-06 ENCOUNTER — Other Ambulatory Visit: Payer: Medicare HMO

## 2014-03-06 ENCOUNTER — Other Ambulatory Visit: Payer: Self-pay | Admitting: *Deleted

## 2014-03-06 ENCOUNTER — Telehealth: Payer: Self-pay

## 2014-03-06 DIAGNOSIS — D509 Iron deficiency anemia, unspecified: Secondary | ICD-10-CM

## 2014-03-06 DIAGNOSIS — R300 Dysuria: Secondary | ICD-10-CM | POA: Insufficient documentation

## 2014-03-06 LAB — URINALYSIS, ROUTINE W REFLEX MICROSCOPIC
Bilirubin Urine: NEGATIVE
Hgb urine dipstick: NEGATIVE
Ketones, ur: NEGATIVE
Nitrite: POSITIVE — AB
Specific Gravity, Urine: <=1.005 — AB (ref 1.000–1.030)
Total Protein, Urine: NEGATIVE
Urine Glucose: NEGATIVE
Urobilinogen, UA: 0.2 (ref 0.0–1.0)
pH: 5.5 (ref 5.0–8.0)

## 2014-03-06 MED ORDER — FOSFOMYCIN TROMETHAMINE 3 G PO PACK: 3.0000 g | PACK | Freq: Once | ORAL | Status: DC

## 2014-03-06 NOTE — Telephone Encounter (Signed)
Called patient, and joint stiffness was reaction with azithromycin. Given treatment failure to Bactrim as well as amoxicillin will call in Fosfomycin 1 dose.

## 2014-03-06 NOTE — Telephone Encounter (Signed)
Pt has called regarding the results to her UA.

## 2014-03-07 NOTE — Telephone Encounter (Signed)
Spoke to pt yesterday and informed that the rx abx was sent in and instructed on how to use and what to expect. Will await the results of the culture to determine if there is any other course of action to take.

## 2014-03-09 ENCOUNTER — Telehealth: Payer: Self-pay

## 2014-03-09 LAB — URINE CULTURE: Colony Count: >=100000

## 2014-03-09 NOTE — Telephone Encounter (Signed)
Spoke to pt. Pt is feeling better. Wanted to know if she needed to give another urine sample. I let her know that she did not unless she was having sx. Pt is not currently.

## 2014-03-11 ENCOUNTER — Telehealth: Payer: Self-pay | Admitting: Internal Medicine

## 2014-03-11 MED ORDER — PHENAZOPYRIDINE HCL 200 MG PO TABS: 200.0000 mg | ORAL_TABLET | Freq: Three times a day (TID) | ORAL | Status: DC | PRN

## 2014-03-11 NOTE — Telephone Encounter (Signed)
Pt states when she urinates, it does not burn is just hurts and now more urgency and frequency. Pt wants medication called in.  [phone]

## 2014-03-11 NOTE — Telephone Encounter (Signed)
Pyridium tid prn send to pharmacy If unimproved pt WILL NEED OV to evaluate this specific problem thanks

## 2014-03-12 ENCOUNTER — Telehealth: Payer: Self-pay

## 2014-03-12 NOTE — Telephone Encounter (Signed)
Called pt back regarding note that was left on my desk. Pt had a question about pyridium being rx by md.   Informed that rx was the best according to the sx that were given. Pt wanted another UA done, informed pt that UA could not be ordered without an OV.   Suggested that urology would be a better option for sx since pt refused to see another provider in the office. Pt said that urology was difficult to get an appt.   Pt said she would call back and may schedule with Neena Rhymes, or [employer].

## 2014-03-12 NOTE — Telephone Encounter (Signed)
Spoke with pt and gave MD advisement.   Pt will call back and make an appt if needed.

## 2014-03-13 ENCOUNTER — Other Ambulatory Visit: Payer: Self-pay | Admitting: Internal Medicine

## 2014-03-21 ENCOUNTER — Other Ambulatory Visit: Payer: Self-pay | Admitting: Internal Medicine

## 2014-03-23 ENCOUNTER — Other Ambulatory Visit: Payer: Self-pay | Admitting: Internal Medicine

## 2014-03-24 NOTE — Telephone Encounter (Signed)
rx faxed

## 2014-03-27 DIAGNOSIS — I447 Left bundle-branch block, unspecified: Secondary | ICD-10-CM

## 2014-03-27 DIAGNOSIS — K631 Perforation of intestine (nontraumatic): Secondary | ICD-10-CM

## 2014-03-27 HISTORY — DX: Perforation of intestine (nontraumatic): K63.1

## 2014-03-27 HISTORY — DX: Left bundle-branch block, unspecified: I44.7

## 2014-03-30 ENCOUNTER — Telehealth: Payer: Self-pay | Admitting: Internal Medicine

## 2014-03-30 ENCOUNTER — Other Ambulatory Visit: Payer: Self-pay

## 2014-03-30 MED ORDER — ZOLPIDEM TARTRATE 5 MG PO TABS: ORAL_TABLET | ORAL | Status: DC

## 2014-03-30 NOTE — Telephone Encounter (Signed)
Pt called new Rx for Zolpidem to be faxed to [place]. Please call if if this is ok, pt stated that she got a letter from [facility] tell her to do that.

## 2014-04-01 ENCOUNTER — Telehealth: Payer: Self-pay | Admitting: Internal Medicine

## 2014-04-01 ENCOUNTER — Other Ambulatory Visit: Payer: Self-pay | Admitting: Internal Medicine

## 2014-04-01 DIAGNOSIS — R3915 Urgency of urination: Secondary | ICD-10-CM

## 2014-04-01 NOTE — Telephone Encounter (Signed)
Pt requesting referral to go to Dr Tannenbaum[facility] - pt has [employer]. Pls let pt know.

## 2014-04-01 NOTE — Telephone Encounter (Signed)
[facility] and confirmed receipt of rx on Monday.   Called pt and indicated that [facility] received the fax for refill of Zolpidem on Monday.

## 2014-04-01 NOTE — Telephone Encounter (Signed)
Needs zolpidem sent to [place].  Please fax to [phone]

## 2014-04-02 ENCOUNTER — Other Ambulatory Visit: Payer: Self-pay | Admitting: *Deleted

## 2014-04-02 MED ORDER — CYANOCOBALAMIN 1000 MCG/ML IJ SOLN: 1000.0000 ug | INTRAMUSCULAR | Status: DC

## 2014-04-02 NOTE — Telephone Encounter (Signed)
done

## 2014-04-07 ENCOUNTER — Telehealth: Payer: Self-pay | Admitting: *Deleted

## 2014-04-07 ENCOUNTER — Encounter: Payer: Self-pay | Admitting: *Deleted

## 2014-04-07 DIAGNOSIS — J3089 Other allergic rhinitis: Secondary | ICD-10-CM | POA: Diagnosis not present

## 2014-04-07 DIAGNOSIS — N3020 Other chronic cystitis without hematuria: Secondary | ICD-10-CM | POA: Diagnosis not present

## 2014-04-07 DIAGNOSIS — J301 Allergic rhinitis due to pollen: Secondary | ICD-10-CM | POA: Diagnosis not present

## 2014-04-07 DIAGNOSIS — J3081 Allergic rhinitis due to animal (cat) (dog) hair and dander: Secondary | ICD-10-CM | POA: Diagnosis not present

## 2014-04-07 NOTE — Telephone Encounter (Signed)
Spoke with patient and she states her [facility] which costs $[facility]. She wants it called to [facility]. Called rx to [facility] and they did not receive rx from 04/02/14. Spoke with Lissa Hoard and gave him rx. Patient notified and scheduled her OV on 05/12/14 at 1:45 PM. Letter mailed with appointment.

## 2014-04-10 ENCOUNTER — Other Ambulatory Visit (INDEPENDENT_AMBULATORY_CARE_PROVIDER_SITE_OTHER): Payer: Commercial Managed Care - HMO

## 2014-04-10 DIAGNOSIS — D509 Iron deficiency anemia, unspecified: Secondary | ICD-10-CM

## 2014-04-10 LAB — HEMOCCULT SLIDES (X 3 CARDS)
Fecal Occult Blood: NEGATIVE
OCCULT 1: NEGATIVE
OCCULT 2: NEGATIVE
OCCULT 3: NEGATIVE
OCCULT 4: NEGATIVE
OCCULT 5: NEGATIVE

## 2014-04-16 ENCOUNTER — Telehealth: Payer: Self-pay | Admitting: Internal Medicine

## 2014-04-16 NOTE — Telephone Encounter (Signed)
Note is for patient husband alejandra hunt

## 2014-04-21 ENCOUNTER — Other Ambulatory Visit: Payer: Self-pay | Admitting: Internal Medicine

## 2014-04-21 DIAGNOSIS — J301 Allergic rhinitis due to pollen: Secondary | ICD-10-CM | POA: Diagnosis not present

## 2014-04-21 DIAGNOSIS — J3081 Allergic rhinitis due to animal (cat) (dog) hair and dander: Secondary | ICD-10-CM | POA: Diagnosis not present

## 2014-04-21 DIAGNOSIS — J3089 Other allergic rhinitis: Secondary | ICD-10-CM | POA: Diagnosis not present

## 2014-05-04 ENCOUNTER — Telehealth: Payer: Self-pay | Admitting: Internal Medicine

## 2014-05-04 NOTE — Telephone Encounter (Signed)
Is requesting humana referral to [facility].  Patient has appointment Tuesday the 16th.

## 2014-05-04 NOTE — Telephone Encounter (Signed)
Patient states she got a virus about a week ago with vomiting and diarrhea. She is still having some diarrhea. She wants to know if Flagyl will help. Told her she needs OV first. She states she will call back tomorrow if she decides to be seen.

## 2014-05-09 ENCOUNTER — Telehealth: Payer: Self-pay | Admitting: Internal Medicine

## 2014-05-12 ENCOUNTER — Encounter: Payer: Self-pay | Admitting: Internal Medicine

## 2014-05-12 ENCOUNTER — Ambulatory Visit (INDEPENDENT_AMBULATORY_CARE_PROVIDER_SITE_OTHER): Payer: Commercial Managed Care - HMO | Admitting: Internal Medicine

## 2014-05-12 ENCOUNTER — Other Ambulatory Visit (INDEPENDENT_AMBULATORY_CARE_PROVIDER_SITE_OTHER): Payer: Commercial Managed Care - HMO

## 2014-05-12 VITALS — BP 108/62 | HR 84 | Ht 65.0 in | Wt 197.0 lb

## 2014-05-12 DIAGNOSIS — K5289 Other specified noninfective gastroenteritis and colitis: Secondary | ICD-10-CM

## 2014-05-12 DIAGNOSIS — J3081 Allergic rhinitis due to animal (cat) (dog) hair and dander: Secondary | ICD-10-CM | POA: Diagnosis not present

## 2014-05-12 DIAGNOSIS — E538 Deficiency of other specified B group vitamins: Secondary | ICD-10-CM

## 2014-05-12 DIAGNOSIS — Z86010 Personal history of colonic polyps: Secondary | ICD-10-CM | POA: Diagnosis not present

## 2014-05-12 DIAGNOSIS — D509 Iron deficiency anemia, unspecified: Secondary | ICD-10-CM

## 2014-05-12 DIAGNOSIS — J3089 Other allergic rhinitis: Secondary | ICD-10-CM | POA: Diagnosis not present

## 2014-05-12 DIAGNOSIS — J301 Allergic rhinitis due to pollen: Secondary | ICD-10-CM | POA: Diagnosis not present

## 2014-05-12 DIAGNOSIS — K529 Noninfective gastroenteritis and colitis, unspecified: Secondary | ICD-10-CM | POA: Diagnosis not present

## 2014-05-12 LAB — VITAMIN B12: Vitamin B-12: 498 pg/mL (ref 211–911)

## 2014-05-12 LAB — VITAMIN D 25 HYDROXY (VIT D DEFICIENCY, FRACTURES): VITD: 14.96 ng/mL — ABNORMAL LOW (ref 30.00–100.00)

## 2014-05-12 MED ORDER — DICYCLOMINE HCL 10 MG PO CAPS: 10.0000 mg | ORAL_CAPSULE | Freq: Three times a day (TID) | ORAL | Status: DC

## 2014-05-12 MED ORDER — DIPHENOXYLATE-ATROPINE 2.5-0.025 MG PO TABS: 1.0000 | ORAL_TABLET | Freq: Two times a day (BID) | ORAL | Status: DC

## 2014-05-12 MED ORDER — MOVIPREP 100 G PO SOLR: ORAL | Status: DC

## 2014-05-12 MED ORDER — METRONIDAZOLE 250 MG PO TABS: 250.0000 mg | ORAL_TABLET | Freq: Three times a day (TID) | ORAL | Status: DC

## 2014-05-12 MED ORDER — OMEPRAZOLE 20 MG PO CPDR: 20.0000 mg | DELAYED_RELEASE_CAPSULE | Freq: Every day | ORAL | Status: DC

## 2014-05-12 NOTE — Progress Notes (Signed)
Alexis [facility] Dec 15, 1933 [id number]  Note: This dictation was prepared with Dragon digital system. Any transcriptional errors that result from this procedure are unintentional.   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 She has positive IBD markers but has no evidence of inflammatory bowel disease. She had a tubular adenoma and colonoscopy in 2000 09/03/2008. She had a negative small bowel biopsies on endoscopy and enteroscopy in June 2010. Her last small bowel capsule endoscopy in 2008 was incomplete. Capsule did not reach the cecum. She is here today because of acute episode of diarrheal illness associated with nausea and low-grade temperature consistent with a viral gastroenteritis. She has severe diarrhea but has improved to about a week of not being able to eat and currently is doing much better on regular diet. She describes pale foul-smelling stools.     Past Medical History  Diagnosis Date  . Obesity   . Lumbosacral disc disease     Chronic pain; History of osteomyelitis 2010 following ESI complication  . ALLERGIC RHINITIS   . 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 ANXIETY   . ASTHMA   . Bariatric surgery status   . COLONIC POLYPS, ADENOMATOUS, HX OF   . DIVERTICULOSIS, COLON   . INSOMNIA   . URINARY URGENCY   . VITAMIN B12 DEFICIENCY   . Hypotension     Past Surgical History  Procedure Laterality Date  . Cholecystectomy    . Tonsillectomy    . Ileojejunal bypass  1976    for obesity  . Back surgery  1994    for ruptured disc    Allergies  Allergen Reactions  . Azithromycin     REACTION: unspecified  . Ciprofloxacin Hcl     REACTION: causes yeast inf  and refuses to take  . Codeine Phosphate     REACTION: unspecified  . Hydrocodone   .  Hydrocodone-Homatropine     REACTION: unspecified  . Levaquin [Levofloxacin In D5w] Other (See Comments)    Muscle soreness  . Meperidine Hcl   . Ultram [Tramadol Hcl]     Family history and social history have been reviewed.  Review of Systems: Frequent loose stools including at night. No weight loss. Denies rectal bleeding  The remainder of the 10 point ROS is negative except as outlined in the H&P  Physical Exam: General Appearance Well developed, in no distress overweight Eyes  Non icteric  HEENT  Non traumatic, normocephalic  Mouth No lesion, tongue papillated, no cheilosis Neck Supple without adenopathy, thyroid not enlarged, no carotid bruits, no JVD Lungs Clear to auscultation bilaterally COR Normal S1, normal S2, regular rhythm, no murmur, quiet precordium Abdomen soft no ascites. Prominent venous pattern. Liver edge at costal margin. No tenderness or mass Rectal pale Hemoccult-negative stool Extremities  No pedal edema Skin No lesions Neurological Alert and oriented x 3 Psychological Normal mood and affect  Assessment and Plan:   81 year old white female with the acute gastroenteritis of one-week duration, now resolved. She still has residual diarrhea which may be due to bacterial overgrowth. We will start Flagyl 250 mg 3 times a day for one week and obtain stool for pathogens.  History of the jejuno- ileal bypass. Foe obesity. History of malabsorption of iron and B12. Last B12 level > 300  pg in September 2015. Iron saturation 18.7% in December 2015. Today we plan to  check sprue profile, IgA level,  B12 and . Vitamin D and  stool for pathogens  Chronic GI blood loss due to absorption and GI bleeding. On oral iron supplements. She has required Iron infusions in the past but not recently  History of tubular adenoma of the colon in 2006 in 2010. She is due for recall colonoscopy, which will be scheduled in next several weeks   Delfin Edis 05/12/2014

## 2014-05-12 NOTE — Telephone Encounter (Signed)
auth # [id number] valid 05/11/14-11/07/14 for 6 visits

## 2014-05-12 NOTE — Patient Instructions (Addendum)
You have been scheduled for a colonoscopy. Please follow written instructions given to you at your visit today.  Please pick up your prep kit at the pharmacy within the next 1-3 days. If you use inhalers (even only as needed), please bring them with you on the day of your procedure.  We have sent the following medications to your pharmacy for you to pick up at your convenience: Flagyl   We have sent the following prescriptions to your mail in pharmacy: dicyclomine, omeprazole and faxed the Lomotil rx to them as well.  Your physician has requested that you go to the basement for the lab work before leaving today.  I appreciate the opportunity to care for you. Dr Asa Lente

## 2014-05-13 ENCOUNTER — Other Ambulatory Visit: Payer: Self-pay | Admitting: *Deleted

## 2014-05-13 LAB — GLIA (IGA/G) + TTG IGA
Gliadin IgA: 8 Units (ref <20)
Gliadin IgG: 2 Units (ref <20)
Tissue Transglutaminase Ab, IgA: 1 U/mL (ref <4)

## 2014-05-13 MED ORDER — VITAMIN D (ERGOCALCIFEROL) 1.25 MG (50000 UNIT) PO CAPS: 50000.0000 [IU] | ORAL_CAPSULE | ORAL | Status: DC

## 2014-05-18 ENCOUNTER — Other Ambulatory Visit: Payer: Self-pay | Admitting: Geriatric Medicine

## 2014-05-18 ENCOUNTER — Telehealth: Payer: Self-pay | Admitting: Internal Medicine

## 2014-05-18 MED ORDER — ALPRAZOLAM 1 MG PO TABS: 1.0000 mg | ORAL_TABLET | Freq: Every evening | ORAL | Status: DC | PRN

## 2014-05-18 NOTE — Telephone Encounter (Signed)
Patient needs humana referral to be sent to Dr. Donneta Romberg at [facility] [address] for allergy injection.  Patient has appointment 9/20

## 2014-05-18 NOTE — Telephone Encounter (Signed)
Patient requesting refill of ALPRAZolam (XANAX) 1 MG tablet [[id number]] . Outstanding since 05/09/2014

## 2014-05-18 NOTE — Telephone Encounter (Signed)
Per dr Asa Lente, new referral needed to see allergy specialist  Please have entered

## 2014-05-18 NOTE — Telephone Encounter (Signed)
Printed and waiting for signature. 

## 2014-05-19 ENCOUNTER — Other Ambulatory Visit: Payer: Self-pay | Admitting: Internal Medicine

## 2014-05-19 NOTE — Telephone Encounter (Signed)
[employer] auth # [id number] valid 12/15/14-06/13/15 for 6 visits.

## 2014-05-22 ENCOUNTER — Telehealth: Payer: Self-pay | Admitting: Internal Medicine

## 2014-05-22 NOTE — Telephone Encounter (Signed)
Patient states that referral for 04/07/2014 visit to allergy clinic was not done and they did not receive. Can we verify that this was completed and get corrected?

## 2014-05-26 ENCOUNTER — Emergency Department (HOSPITAL_COMMUNITY)
Admission: EM | Admit: 2014-05-26 | Discharge: 2014-05-26 | Disposition: A | Discharge status: Home / Self Care | Payer: Medicare HMO | Source: Home / Self Care | Attending: Family Medicine | Admitting: Family Medicine

## 2014-05-26 ENCOUNTER — Encounter (HOSPITAL_COMMUNITY): Payer: Self-pay | Admitting: Emergency Medicine

## 2014-05-26 DIAGNOSIS — J301 Allergic rhinitis due to pollen: Secondary | ICD-10-CM | POA: Diagnosis not present

## 2014-05-26 DIAGNOSIS — J3089 Other allergic rhinitis: Secondary | ICD-10-CM | POA: Diagnosis not present

## 2014-05-26 DIAGNOSIS — J3081 Allergic rhinitis due to animal (cat) (dog) hair and dander: Secondary | ICD-10-CM | POA: Diagnosis not present

## 2014-05-26 DIAGNOSIS — N3001 Acute cystitis with hematuria: Secondary | ICD-10-CM | POA: Diagnosis not present

## 2014-05-26 LAB — POCT URINALYSIS DIP (DEVICE)
Bilirubin Urine: NEGATIVE
Glucose, UA: NEGATIVE mg/dL
Ketones, ur: NEGATIVE mg/dL
Nitrite: POSITIVE — AB
Protein, ur: NEGATIVE mg/dL
Specific Gravity, Urine: 1.01 (ref 1.005–1.030)
Urobilinogen, UA: 0.2 mg/dL (ref 0.0–1.0)
pH: 5.5 (ref 5.0–8.0)

## 2014-05-26 MED ORDER — CEPHALEXIN 500 MG PO CAPS: 500.0000 mg | ORAL_CAPSULE | Freq: Two times a day (BID) | ORAL | Status: DC

## 2014-05-26 NOTE — Telephone Encounter (Signed)
Spoke w/pt. She states [employer] is investigating her allergy shots. We did not receive a referral request from the allergy office or from the patient for these shots. Patient is aware and will let us know if there is anything else we can do.

## 2014-05-26 NOTE — ED Notes (Signed)
Pt states that she believes that she has a UTI since 05/21/2014, she states that she has had painful urination and urinary frequency.

## 2014-05-26 NOTE — Discharge Instructions (Signed)
Thank you for coming in today. If your belly pain worsens, or you have high fever, bad vomiting, blood in your stool or black tarry stool go to the Emergency Room.   Urinary Tract Infection Urinary tract infections (UTIs) can develop anywhere along your urinary tract. Your urinary tract is your body's drainage system for removing wastes and extra water. Your urinary tract includes two kidneys, two ureters, a bladder, and a urethra. Your kidneys are a pair of bean-shaped organs. Each kidney is about the size of your fist. They are located below your ribs, one on each side of your spine. CAUSES Infections are caused by microbes, which are microscopic organisms, including fungi, viruses, and bacteria. These organisms are so small that they can only be seen through a microscope. Bacteria are the microbes that most commonly cause UTIs. SYMPTOMS  Symptoms of UTIs may vary by age and gender of the patient and by the location of the infection. Symptoms in young women typically include a frequent and intense urge to urinate and a painful, burning feeling in the bladder or urethra during urination. Older women and men are more likely to be tired, shaky, and weak and have muscle aches and abdominal pain. A fever may mean the infection is in your kidneys. Other symptoms of a kidney infection include pain in your back or sides below the ribs, nausea, and vomiting. DIAGNOSIS To diagnose a UTI, your caregiver will ask you about your symptoms. Your caregiver also will ask to provide a urine sample. The urine sample will be tested for bacteria and white blood cells. White blood cells are made by your body to help fight infection. TREATMENT  Typically, UTIs can be treated with medication. Because most UTIs are caused by a bacterial infection, they usually can be treated with the use of antibiotics. The choice of antibiotic and length of treatment depend on your symptoms and the type of bacteria causing your  infection. HOME CARE INSTRUCTIONS  If you were prescribed antibiotics, take them exactly as your caregiver instructs you. Finish the medication even if you feel better after you have only taken some of the medication.  Drink enough water and fluids to keep your urine clear or pale yellow.  Avoid caffeine, tea, and carbonated beverages. They tend to irritate your bladder.  Empty your bladder often. Avoid holding urine for long periods of time.  Empty your bladder before and after sexual intercourse.  After a bowel movement, women should cleanse from front to back. Use each tissue only once. SEEK MEDICAL CARE IF:   You have back pain.  You develop a fever.  Your symptoms do not begin to resolve within 3 days. SEEK IMMEDIATE MEDICAL CARE IF:   You have severe back pain or lower abdominal pain.  You develop chills.  You have nausea or vomiting.  You have continued burning or discomfort with urination. MAKE SURE YOU:   Understand these instructions.  Will watch your condition.  Will get help right away if you are not doing well or get worse. Document Released: 12/21/2004 Document Revised: 09/12/2011 Document Reviewed: 04/21/2011 ExitCare Patient Information 2015 [employer]. This information is not intended to replace advice given to you by your health care provider. Make sure you discuss any questions you have with your health care provider.  

## 2014-05-26 NOTE — ED Provider Notes (Signed)
Alexis Olson is a 81 y.o. female who presents to Urgent Care today for UTI. Patient has a 1 week history of urinary frequency urgency and dysuria. Pyridium has been mildly helpful. No fever, chills, NVD or abdominal pain.    Past Medical History  Diagnosis Date  . Obesity   . Lumbosacral disc disease     Chronic pain; History of osteomyelitis 2010 following ESI complication  . ALLERGIC RHINITIS   . 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 ANXIETY   . ASTHMA   . Bariatric surgery status   . COLONIC POLYPS, ADENOMATOUS, HX OF   . DIVERTICULOSIS, COLON   . INSOMNIA   . URINARY URGENCY   . VITAMIN B12 DEFICIENCY   . Hypotension    Past Surgical History  Procedure Laterality Date  . Cholecystectomy    . Tonsillectomy    . Ileojejunal bypass  1976    for obesity  . Back surgery  1994    for ruptured disc   History  Substance Use Topics  . Smoking status: Never Smoker   . Smokeless tobacco: Never Used  . Alcohol Use: No   ROS as above Medications: No current facility-administered medications for this encounter.   Current Outpatient Prescriptions  Medication Sig Dispense Refill  . acetaminophen-codeine (TYLENOL #2) 300-15 MG per tablet TAKE 1 TABLET EVERY 4 HOURS AS NEEDED FOR PAIN 30 tablet 0  . ALPRAZolam (XANAX) 1 MG tablet Take 1 tablet (1 mg total) by mouth at bedtime as needed. 30 tablet 1  . amitriptyline (ELAVIL) 100 MG tablet TAKE 1 TABLET AT BEDTIME 90 tablet 3  . amoxicillin (AMOXIL) 500 MG capsule Take 1 capsule (500 mg total) by mouth 3 (three) times daily. 15 capsule 0  . azelastine (OPTIVAR) 0.05 % ophthalmic solution Place 1 drop into both eyes 2 (two) times daily.      . benzonatate (TESSALON PERLES) 100 MG capsule Take 1 capsule (100 mg total) by mouth 3 (three) times daily as needed for cough. 30 capsule 0  . budesonide-formoterol (SYMBICORT) 80-4.5 MCG/ACT inhaler Inhale 2 puffs into the lungs 2 (two) times daily.  1 Inhaler 12  . cephALEXin (KEFLEX) 500 MG capsule Take 1 capsule (500 mg total) by mouth 2 (two) times daily. 14 capsule 0  . cetirizine (ZYRTEC) 10 MG tablet Take 10 mg by mouth daily.     . cyanocobalamin (,VITAMIN B-12,) 1000 MCG/ML injection Inject 1 mL (1,000 mcg total) into the muscle every 30 (thirty) days. 6 mL 0  . dicyclomine (BENTYL) 10 MG capsule Take 1 capsule (10 mg total) by mouth 3 (three) times daily. 90 capsule 3  . diphenoxylate-atropine (LOMOTIL) 2.5-0.025 MG per tablet Take 1 tablet by mouth 2 (two) times daily. 60 tablet 3  . Fe Fum-FA-B Cmp-C-Zn-Mg-Mn-Cu (HEMOCYTE PLUS) 106-1 MG CAPS TAKE ONE TABLET BY MOUTH TWICE A DAY 30 each 1  . fluconazole (DIFLUCAN) 150 MG tablet Take 1 tablet (150 mg total) by mouth once. May repeat dose next day if needed 2 tablet 0  . fluticasone (FLONASE) 50 MCG/ACT nasal spray 2 sprays by Nasal route daily.      . fosfomycin (MONUROL) 3 G PACK Take 3 g by mouth once. 3 g 0  . gabapentin (NEURONTIN) 300 MG capsule Take 300 mg by mouth daily.    . hydrOXYzine (ATARAX) 25 MG tablet Take 25 mg by mouth 3 (three) times daily as needed.      Marland Kitchen  levocetirizine (XYZAL) 5 MG tablet Take 1 tablet (5 mg total) by mouth every evening. 90 tablet 3  . magnesium oxide (MAG-OX 400) 400 MG tablet Take 400 mg by mouth 2 (two) times daily.     . methocarbamol (ROBAXIN) 500 MG tablet TAKE 1 TABLET (500 MG TOTAL) BY MOUTH 3 (THREE) TIMES DAILY AS NEEDED (MUSCLE SPASM). 60 tablet 2  . metroNIDAZOLE (FLAGYL) 250 MG tablet Take 1 tablet (250 mg total) by mouth 3 (three) times daily. 21 tablet 1  . montelukast (SINGULAIR) 10 MG tablet Take 10 mg by mouth daily.     Marland Kitchen MOVIPREP 100 G SOLR Use per prep instructions 1 kit 0  . nabumetone (RELAFEN) 750 MG tablet Take 1 tablet (750 mg total) by mouth 2 (two) times daily. 60 tablet 0  . nystatin (MYCOSTATIN) 100000 UNIT/ML suspension TAKE 5 MLS (500,000 UNITS TOTAL) BY MOUTH 4 (FOUR) TIMES DAILY. 200 mL 0  . omeprazole  (PRILOSEC) 20 MG capsule Take 1 capsule (20 mg total) by mouth daily. 30 capsule 10  . phenazopyridine (PYRIDIUM) 200 MG tablet Take 1 tablet (200 mg total) by mouth 3 (three) times daily as needed for pain. 30 tablet 1  . potassium chloride SA (K-DUR,KLOR-CON) 20 MEQ tablet Take 1 tablet by mouth daily 90 tablet 3  . promethazine-phenylephrine (PROMETHAZINE-PHENYLEPHRINE) 6.25-5 MG/5ML SYRP Take 5 mLs by mouth every 4 (four) hours as needed for congestion. 200 mL 0  . PX ENTERIC ASPIRIN 81 MG EC tablet Take 81 mg by mouth daily.     Marland Kitchen sulfamethoxazole-trimethoprim (SEPTRA DS) 800-160 MG per tablet Take 1 tablet by mouth 2 (two) times daily. 10 tablet 0  . valACYclovir (VALTREX) 500 MG tablet Take 500 mg by mouth as needed.      . Vitamin D, Ergocalciferol, (DRISDOL) 50000 UNITS CAPS capsule Take 1 capsule (50,000 Units total) by mouth every 7 (seven) days. 12 capsule 0  . vitamin E 400 UNIT capsule Take 400 Units by mouth daily.      . zafirlukast (ACCOLATE) 20 MG tablet Take 1 by mouth twice a day    . zolpidem (AMBIEN) 5 MG tablet TAKE 1 TABLET BY MOUTH AT BEDTIME 30 tablet 5   Allergies  Allergen Reactions  . Azithromycin     REACTION: unspecified  . Ciprofloxacin Hcl     REACTION: causes yeast inf  and refuses to take  . Codeine Phosphate     REACTION: unspecified  . Hydrocodone   . Hydrocodone-Homatropine     REACTION: unspecified  . Levaquin [Levofloxacin In D5w] Other (See Comments)    Muscle soreness  . Meperidine Hcl   . Ultram [Tramadol Hcl]      Exam:  BP 142/64 mmHg  Pulse 88  Temp(Src) 98.1 F (36.7 C) (Oral)  Resp 20  SpO2 96% Gen: Well NAD HEENT: EOMI,  MMM Lungs: Normal work of breathing. CTABL Heart: RRR no MRG Abd: NABS, Soft. Nondistended, Nontender, no CV angle TTP. Exts: Brisk capillary refill, warm and well perfused.   Results for orders placed or performed during the hospital encounter of 05/26/14 (from the past 24 hour(s))  POCT urinalysis dip  (device)     Status: Abnormal   Collection Time: 05/26/14  8:07 PM  Result Value Ref Range   Glucose, UA NEGATIVE NEGATIVE mg/dL   Bilirubin Urine NEGATIVE NEGATIVE   Ketones, ur NEGATIVE NEGATIVE mg/dL   Specific Gravity, Urine 1.010 1.005 - 1.030   Hgb urine dipstick TRACE (A) NEGATIVE  pH 5.5 5.0 - 8.0   Protein, ur NEGATIVE NEGATIVE mg/dL   Urobilinogen, UA 0.2 0.0 - 1.0 mg/dL   Nitrite POSITIVE (A) NEGATIVE   Leukocytes, UA LARGE (A) NEGATIVE   No results found.  Assessment and Plan: 81 y.o. female with UTI. Culture pending. Tx with keflex.   Discussed warning signs or symptoms. Please see discharge instructions. Patient expresses understanding.     Gregor Hams, MD 05/26/14 2049

## 2014-05-26 NOTE — Telephone Encounter (Signed)
Left message for patient to call office. We are unable to do retro referrals. There is a referral in place starting 05/21/14 through 08/19/14 for 6 visits auth #[id number].

## 2014-05-28 LAB — URINE CULTURE: Colony Count: >=100000

## 2014-06-01 NOTE — ED Notes (Signed)
Urine culture: >100,000 colonies E. Coli.  Pt. treated with Keflex- intermediate.  3/3 Message sent to Dr. Georgina Snell.  He wrote back if not better he will change Rx. I called pt. and left a message to call Call 1. Roselyn Meier 06/01/2014

## 2014-06-02 ENCOUNTER — Encounter: Payer: Self-pay | Admitting: Internal Medicine

## 2014-06-03 ENCOUNTER — Telehealth: Payer: Self-pay | Admitting: Internal Medicine

## 2014-06-03 NOTE — ED Notes (Signed)
Patient returned call, states she is better, but has frequency at night. Please call on cell phone if you need to contact patient

## 2014-06-07 ENCOUNTER — Telehealth (HOSPITAL_COMMUNITY): Payer: Self-pay | Admitting: Family Medicine

## 2014-06-07 MED ORDER — CEFDINIR 300 MG PO CAPS: 300.0000 mg | ORAL_CAPSULE | Freq: Two times a day (BID) | ORAL | Status: DC

## 2014-06-07 NOTE — ED Notes (Signed)
Patient remains symptomatic after treatment with Keflex for UTI. Culture showed intermediate resistance to cefazolin. The bacteria is sensitive to third-generation cephalosporins. We'll use oral Omnicef. Return as needed.  Gregor Hams, MD 06/07/14 [phone]

## 2014-06-07 NOTE — ED Notes (Signed)
I called pt. and left another message to call.  Call 3.  Pt. called back. She said she is still having frequency of urination but the burning went away with the Keflex. States she gets up 3- 4 times at night to urinate. Pt. has finished Keflex.  Dr. Georgina Snell notified and wants to give her Carole Civil that is sensitive. [facility] [address]  Pt. told this and instructed to f/u with PCP if not better after the medication.  She said it takes 3 mos. to get in to see her doctor. I told her she can come back here for a recheck if not better. Roselyn Meier 06/07/2014

## 2014-06-09 DIAGNOSIS — J3081 Allergic rhinitis due to animal (cat) (dog) hair and dander: Secondary | ICD-10-CM | POA: Diagnosis not present

## 2014-06-09 DIAGNOSIS — J3089 Other allergic rhinitis: Secondary | ICD-10-CM | POA: Diagnosis not present

## 2014-06-09 DIAGNOSIS — J301 Allergic rhinitis due to pollen: Secondary | ICD-10-CM | POA: Diagnosis not present

## 2014-06-15 DIAGNOSIS — J301 Allergic rhinitis due to pollen: Secondary | ICD-10-CM | POA: Diagnosis not present

## 2014-06-15 DIAGNOSIS — J3089 Other allergic rhinitis: Secondary | ICD-10-CM | POA: Diagnosis not present

## 2014-06-18 ENCOUNTER — Encounter: Payer: Medicare HMO | Admitting: Internal Medicine

## 2014-06-23 DIAGNOSIS — J301 Allergic rhinitis due to pollen: Secondary | ICD-10-CM | POA: Diagnosis not present

## 2014-06-23 DIAGNOSIS — J3089 Other allergic rhinitis: Secondary | ICD-10-CM | POA: Diagnosis not present

## 2014-06-23 DIAGNOSIS — J3081 Allergic rhinitis due to animal (cat) (dog) hair and dander: Secondary | ICD-10-CM | POA: Diagnosis not present

## 2014-07-02 DIAGNOSIS — J301 Allergic rhinitis due to pollen: Secondary | ICD-10-CM | POA: Diagnosis not present

## 2014-07-02 DIAGNOSIS — J3081 Allergic rhinitis due to animal (cat) (dog) hair and dander: Secondary | ICD-10-CM | POA: Diagnosis not present

## 2014-07-02 DIAGNOSIS — J3089 Other allergic rhinitis: Secondary | ICD-10-CM | POA: Diagnosis not present

## 2014-07-02 DIAGNOSIS — N3000 Acute cystitis without hematuria: Secondary | ICD-10-CM | POA: Diagnosis not present

## 2014-07-02 DIAGNOSIS — N3020 Other chronic cystitis without hematuria: Secondary | ICD-10-CM | POA: Diagnosis not present

## 2014-07-06 ENCOUNTER — Telehealth: Payer: Self-pay | Admitting: Internal Medicine

## 2014-07-06 ENCOUNTER — Other Ambulatory Visit: Payer: Self-pay

## 2014-07-06 DIAGNOSIS — J301 Allergic rhinitis due to pollen: Secondary | ICD-10-CM | POA: Diagnosis not present

## 2014-07-06 NOTE — Telephone Encounter (Signed)
Completed PA on cover my meds waiting on approval status...Alexis Olson

## 2014-07-06 NOTE — Telephone Encounter (Signed)
Patient is requesting a fill of zolpidem (AMBIEN) 5 MG tablet [[id number]] sent to [facility] outpatient pharmacy.  I have not been able to locate PA paperwork, but she states that it was sent on Friday per [facility].  She states that PA will not be needed if we send to [facility] outpatient pharmacy.

## 2014-07-06 NOTE — Telephone Encounter (Signed)
Pt called in and said that her zolpidem (AMBIEN) 5 MG tablet [[id number]] has 2 refills on it but pharmacy will not refill it.  She thinks that she needs a PA for this med .  Pt expressed that she only has 2 left

## 2014-07-07 NOTE — Telephone Encounter (Signed)
Received PA back med has been approved up until 07/06/2015...Johny Chess

## 2014-07-09 DIAGNOSIS — N3020 Other chronic cystitis without hematuria: Secondary | ICD-10-CM | POA: Diagnosis not present

## 2014-07-09 DIAGNOSIS — N390 Urinary tract infection, site not specified: Secondary | ICD-10-CM | POA: Diagnosis not present

## 2014-07-10 DIAGNOSIS — J3081 Allergic rhinitis due to animal (cat) (dog) hair and dander: Secondary | ICD-10-CM | POA: Diagnosis not present

## 2014-07-10 DIAGNOSIS — J301 Allergic rhinitis due to pollen: Secondary | ICD-10-CM | POA: Diagnosis not present

## 2014-07-10 DIAGNOSIS — J3089 Other allergic rhinitis: Secondary | ICD-10-CM | POA: Diagnosis not present

## 2014-07-17 DIAGNOSIS — N3020 Other chronic cystitis without hematuria: Secondary | ICD-10-CM | POA: Diagnosis not present

## 2014-07-21 DIAGNOSIS — J3089 Other allergic rhinitis: Secondary | ICD-10-CM | POA: Diagnosis not present

## 2014-07-21 DIAGNOSIS — J301 Allergic rhinitis due to pollen: Secondary | ICD-10-CM | POA: Diagnosis not present

## 2014-07-30 ENCOUNTER — Telehealth: Payer: Self-pay | Admitting: Internal Medicine

## 2014-07-30 NOTE — Telephone Encounter (Signed)
Patient and husband Alexis Olson ([birth year]) has some doctors appointments coming up. Both with Dr. Tama Gander Ophthamology on 5/19. He sees Dr. Eartha Inch on 5/31. He also has an injection with Dr. Nelva Bush on 5/18(not sure if need referral for injection).  She is with Dr. Vincent Peyer Dermantologist. He sees Dr. Mechele Dawley Dermantologist 8/10

## 2014-08-03 ENCOUNTER — Other Ambulatory Visit: Payer: Self-pay | Admitting: Internal Medicine

## 2014-08-04 DIAGNOSIS — J3089 Other allergic rhinitis: Secondary | ICD-10-CM | POA: Diagnosis not present

## 2014-08-04 DIAGNOSIS — J301 Allergic rhinitis due to pollen: Secondary | ICD-10-CM | POA: Diagnosis not present

## 2014-08-04 NOTE — Telephone Encounter (Addendum)
Dr. Katy Apo Josem Kaufmann #[id number] valid 08/13/14-02/09/15 for 6 visits  Dr. Rolm Bookbinder Josem Kaufmann #[id number] valid 10/16/14-04/17/14 for 6 visits  See pt spouse medical record for his authorizations.

## 2014-08-07 ENCOUNTER — Encounter: Payer: Commercial Managed Care - HMO | Admitting: Internal Medicine

## 2014-08-10 ENCOUNTER — Telehealth: Payer: Self-pay | Admitting: Internal Medicine

## 2014-08-10 ENCOUNTER — Other Ambulatory Visit: Payer: Self-pay | Admitting: Internal Medicine

## 2014-08-10 NOTE — Telephone Encounter (Signed)
I spoke w/the patient last week. This referral has already been completed and faxed to [facility].

## 2014-08-10 NOTE — Telephone Encounter (Signed)
Patient is requesting humana referral to Dr. Prudencio Burly.  Patient has appointment on 5/19.

## 2014-08-11 DIAGNOSIS — J3089 Other allergic rhinitis: Secondary | ICD-10-CM | POA: Diagnosis not present

## 2014-08-11 DIAGNOSIS — J301 Allergic rhinitis due to pollen: Secondary | ICD-10-CM | POA: Diagnosis not present

## 2014-08-11 DIAGNOSIS — J3081 Allergic rhinitis due to animal (cat) (dog) hair and dander: Secondary | ICD-10-CM | POA: Diagnosis not present

## 2014-08-12 ENCOUNTER — Other Ambulatory Visit: Payer: Self-pay

## 2014-08-12 ENCOUNTER — Other Ambulatory Visit: Payer: Self-pay | Admitting: Internal Medicine

## 2014-08-12 MED ORDER — ALPRAZOLAM 1 MG PO TABS: 1.0000 mg | ORAL_TABLET | Freq: Every evening | ORAL | Status: DC | PRN

## 2014-08-12 NOTE — Telephone Encounter (Signed)
Printed for md to sign  

## 2014-08-13 DIAGNOSIS — Z961 Presence of intraocular lens: Secondary | ICD-10-CM | POA: Diagnosis not present

## 2014-08-14 ENCOUNTER — Other Ambulatory Visit: Payer: Self-pay | Admitting: Internal Medicine

## 2014-08-14 DIAGNOSIS — J3081 Allergic rhinitis due to animal (cat) (dog) hair and dander: Secondary | ICD-10-CM | POA: Diagnosis not present

## 2014-08-25 DIAGNOSIS — J3089 Other allergic rhinitis: Secondary | ICD-10-CM | POA: Diagnosis not present

## 2014-08-25 DIAGNOSIS — J3081 Allergic rhinitis due to animal (cat) (dog) hair and dander: Secondary | ICD-10-CM | POA: Diagnosis not present

## 2014-08-25 DIAGNOSIS — J301 Allergic rhinitis due to pollen: Secondary | ICD-10-CM | POA: Diagnosis not present

## 2014-08-26 DIAGNOSIS — K631 Perforation of intestine (nontraumatic): Secondary | ICD-10-CM

## 2014-08-26 HISTORY — DX: Perforation of intestine (nontraumatic): K63.1

## 2014-08-28 ENCOUNTER — Other Ambulatory Visit: Payer: Self-pay | Admitting: Internal Medicine

## 2014-08-31 DIAGNOSIS — N3020 Other chronic cystitis without hematuria: Secondary | ICD-10-CM | POA: Diagnosis not present

## 2014-09-01 DIAGNOSIS — J3081 Allergic rhinitis due to animal (cat) (dog) hair and dander: Secondary | ICD-10-CM | POA: Diagnosis not present

## 2014-09-01 DIAGNOSIS — J3089 Other allergic rhinitis: Secondary | ICD-10-CM | POA: Diagnosis not present

## 2014-09-01 DIAGNOSIS — J301 Allergic rhinitis due to pollen: Secondary | ICD-10-CM | POA: Diagnosis not present

## 2014-09-04 ENCOUNTER — Telehealth: Payer: Self-pay | Admitting: *Deleted

## 2014-09-04 ENCOUNTER — Ambulatory Visit (AMBULATORY_SURGERY_CENTER): Payer: Self-pay | Admitting: *Deleted

## 2014-09-04 ENCOUNTER — Encounter (INDEPENDENT_AMBULATORY_CARE_PROVIDER_SITE_OTHER): Payer: Self-pay

## 2014-09-04 VITALS — Ht 65.5 in | Wt 192.6 lb

## 2014-09-04 DIAGNOSIS — Z86010 Personal history of colonic polyps: Secondary | ICD-10-CM

## 2014-09-04 MED ORDER — NA SULFATE-K SULFATE-MG SULF 17.5-3.13-1.6 GM/177ML PO SOLN: 1.0000 | Freq: Once | ORAL | Status: DC

## 2014-09-04 NOTE — Telephone Encounter (Signed)
Dr. Olevia Perches,  Alexis Olson is here with me for previsit, she thought she was seeing you today. She has some concerns and questions about diarrhea and nausea. Having accidents and states that her nausea is when her stomach is empty. She would like her nausea to be addressed. She is taking Lomotil 2-6 tablets per day, she states that the amount you are prescribing is not enough. She states she does not need the regular prep, she feels 1-2 bottles of magnesium citrate would clean her out. Would you like to address these issues via phone with her, have [place] call her back or cancel the colonoscopy and have an office visit scheduled?

## 2014-09-04 NOTE — Telephone Encounter (Signed)
Dr. Olevia Perches called me in [facility] room 51, advised patient to keep colonoscopy on 09/18/14 and could discuss concerns. Advised patient to follow the prep as prescribed, informed patient and she is in agreement. Suprep orders given.

## 2014-09-04 NOTE — Progress Notes (Signed)
Denies allergies to eggs or soy products. Denies complications with sedation or anesthesia. Denies O2 use. Denies use of diet or weight loss medications.  Emmi instructions not given for colonoscopy, pt states she does not have access to email at this time.

## 2014-09-04 NOTE — Telephone Encounter (Signed)
We have discussed this  Already., see prior note.

## 2014-09-07 ENCOUNTER — Telehealth: Payer: Self-pay | Admitting: Internal Medicine

## 2014-09-07 DIAGNOSIS — D500 Iron deficiency anemia secondary to blood loss (chronic): Secondary | ICD-10-CM

## 2014-09-07 NOTE — Telephone Encounter (Signed)
I agree, she had a jejunoileal bypass in 1960 and has chronic GIB. OK to add EGD but do not reschedule her, just add EGD. Thanx

## 2014-09-07 NOTE — Telephone Encounter (Signed)
Spoke with patient and she would like to have labs checked. States she is feeling bad. She is leaving for the beach today and will not be back until Saturday. She also states she is due for a colonoscopy on next Friday.States she has had EGD in past with colonoscopy. Reviewed her OV from 04/2014 and only colonoscopy was ordered. (It has been rescheduled 3 times by patient)

## 2014-09-08 NOTE — Telephone Encounter (Signed)
OK to check CBC,Iron panel, B12, C-met, TSH, sed.rate,

## 2014-09-08 NOTE — Telephone Encounter (Signed)
Spoke with patient and gave her new procedure times. She is asking if she can have some lab work prior to procedure because she is not feeling well, feels weak. Please, advise.

## 2014-09-08 NOTE — Telephone Encounter (Signed)
Labs in EPIC

## 2014-09-09 ENCOUNTER — Other Ambulatory Visit: Payer: Self-pay | Admitting: *Deleted

## 2014-09-09 DIAGNOSIS — K922 Gastrointestinal hemorrhage, unspecified: Secondary | ICD-10-CM

## 2014-09-15 ENCOUNTER — Other Ambulatory Visit (INDEPENDENT_AMBULATORY_CARE_PROVIDER_SITE_OTHER): Payer: Commercial Managed Care - HMO

## 2014-09-15 DIAGNOSIS — D500 Iron deficiency anemia secondary to blood loss (chronic): Secondary | ICD-10-CM | POA: Diagnosis not present

## 2014-09-15 DIAGNOSIS — J3089 Other allergic rhinitis: Secondary | ICD-10-CM | POA: Diagnosis not present

## 2014-09-15 DIAGNOSIS — J301 Allergic rhinitis due to pollen: Secondary | ICD-10-CM | POA: Diagnosis not present

## 2014-09-15 LAB — COMPREHENSIVE METABOLIC PANEL
ALT: 14 U/L (ref 0–35)
AST: 15 U/L (ref 0–37)
Albumin: 4 g/dL (ref 3.5–5.2)
Alkaline Phosphatase: 47 U/L (ref 39–117)
BUN: 11 mg/dL (ref 6–23)
CO2: 27 mEq/L (ref 19–32)
Calcium: 9.1 mg/dL (ref 8.4–10.5)
Chloride: 102 mEq/L (ref 96–112)
Creatinine, Ser: 0.54 mg/dL (ref 0.40–1.20)
GFR: 115.64 mL/min (ref >60.00)
Glucose, Bld: 93 mg/dL (ref 70–99)
Potassium: 3.4 mEq/L — ABNORMAL LOW (ref 3.5–5.1)
Sodium: 135 mEq/L (ref 135–145)
Total Bilirubin: 0.3 mg/dL (ref 0.2–1.2)
Total Protein: 6.8 g/dL (ref 6.0–8.3)

## 2014-09-15 LAB — CBC WITH DIFFERENTIAL/PLATELET
Basophils Absolute: 0 10*3/uL (ref 0.0–0.1)
Basophils Relative: 0.4 % (ref 0.0–3.0)
Eosinophils Absolute: 0.2 10*3/uL (ref 0.0–0.7)
Eosinophils Relative: 2.6 % (ref 0.0–5.0)
HCT: 35.2 % — ABNORMAL LOW (ref 36.0–46.0)
Hemoglobin: 11.6 g/dL — ABNORMAL LOW (ref 12.0–15.0)
Lymphocytes Relative: 34.2 % (ref 12.0–46.0)
Lymphs Abs: 2.9 10*3/uL (ref 0.7–4.0)
MCHC: 33.1 g/dL (ref 30.0–36.0)
MCV: 95.6 fl (ref 78.0–100.0)
Monocytes Absolute: 0.6 10*3/uL (ref 0.1–1.0)
Monocytes Relative: 7.6 % (ref 3.0–12.0)
Neutro Abs: 4.6 10*3/uL (ref 1.4–7.7)
Neutrophils Relative %: 55.2 % (ref 43.0–77.0)
Platelets: 272 10*3/uL (ref 150.0–400.0)
RBC: 3.68 Mil/uL — ABNORMAL LOW (ref 3.87–5.11)
RDW: 14.1 % (ref 11.5–15.5)
WBC: 8.4 10*3/uL (ref 4.0–10.5)

## 2014-09-15 LAB — IBC PANEL
Iron: 53 ug/dL (ref 42–145)
Saturation Ratios: 17.7 % — ABNORMAL LOW (ref 20.0–50.0)
Transferrin: 214 mg/dL (ref 212.0–360.0)

## 2014-09-15 LAB — VITAMIN B12: Vitamin B-12: 398 pg/mL (ref 211–911)

## 2014-09-15 LAB — TSH: TSH: 0.48 u[IU]/mL (ref 0.35–4.50)

## 2014-09-15 LAB — SEDIMENTATION RATE: Sed Rate: 15 mm/hr (ref 0–22)

## 2014-09-18 ENCOUNTER — Inpatient Hospital Stay (HOSPITAL_COMMUNITY): Payer: Commercial Managed Care - HMO | Admitting: Certified Registered"

## 2014-09-18 ENCOUNTER — Encounter (HOSPITAL_COMMUNITY): Payer: Self-pay | Admitting: Emergency Medicine

## 2014-09-18 ENCOUNTER — Inpatient Hospital Stay (HOSPITAL_COMMUNITY): Payer: Commercial Managed Care - HMO

## 2014-09-18 ENCOUNTER — Inpatient Hospital Stay (HOSPITAL_COMMUNITY)
Admission: EM | Admit: 2014-09-18 | Discharge: 2014-09-26 | DRG: 344 | Disposition: A | Discharge status: Home Health | Payer: Commercial Managed Care - HMO | Attending: General Surgery | Admitting: General Surgery

## 2014-09-18 ENCOUNTER — Ambulatory Visit (AMBULATORY_SURGERY_CENTER): Payer: Commercial Managed Care - HMO | Admitting: Internal Medicine

## 2014-09-18 ENCOUNTER — Encounter (HOSPITAL_COMMUNITY): Admission: EM | Disposition: A | Discharge status: Home Health | Payer: Self-pay | Source: Home / Self Care

## 2014-09-18 ENCOUNTER — Emergency Department (HOSPITAL_COMMUNITY): Payer: Commercial Managed Care - HMO

## 2014-09-18 ENCOUNTER — Encounter: Payer: Self-pay | Admitting: Internal Medicine

## 2014-09-18 VITALS — BP 123/76 | HR 98 | Temp 96.9°F | Resp 16 | Ht 65.5 in | Wt 192.0 lb

## 2014-09-18 DIAGNOSIS — Y658 Other specified misadventures during surgical and medical care: Secondary | ICD-10-CM

## 2014-09-18 DIAGNOSIS — J9600 Acute respiratory failure, unspecified whether with hypoxia or hypercapnia: Secondary | ICD-10-CM | POA: Diagnosis not present

## 2014-09-18 DIAGNOSIS — Z881 Allergy status to other antibiotic agents status: Secondary | ICD-10-CM | POA: Diagnosis not present

## 2014-09-18 DIAGNOSIS — R339 Retention of urine, unspecified: Secondary | ICD-10-CM | POA: Diagnosis present

## 2014-09-18 DIAGNOSIS — D649 Anemia, unspecified: Secondary | ICD-10-CM | POA: Diagnosis not present

## 2014-09-18 DIAGNOSIS — R0602 Shortness of breath: Secondary | ICD-10-CM

## 2014-09-18 DIAGNOSIS — D120 Benign neoplasm of cecum: Secondary | ICD-10-CM

## 2014-09-18 DIAGNOSIS — Z0000 Encounter for general adult medical examination without abnormal findings: Secondary | ICD-10-CM

## 2014-09-18 DIAGNOSIS — K551 Chronic vascular disorders of intestine: Secondary | ICD-10-CM | POA: Diagnosis not present

## 2014-09-18 DIAGNOSIS — R197 Diarrhea, unspecified: Secondary | ICD-10-CM

## 2014-09-18 DIAGNOSIS — K659 Peritonitis, unspecified: Secondary | ICD-10-CM | POA: Diagnosis not present

## 2014-09-18 DIAGNOSIS — Z86010 Personal history of colonic polyps: Secondary | ICD-10-CM

## 2014-09-18 DIAGNOSIS — J9601 Acute respiratory failure with hypoxia: Secondary | ICD-10-CM | POA: Diagnosis not present

## 2014-09-18 DIAGNOSIS — Z9884 Bariatric surgery status: Secondary | ICD-10-CM

## 2014-09-18 DIAGNOSIS — F419 Anxiety disorder, unspecified: Secondary | ICD-10-CM | POA: Diagnosis present

## 2014-09-18 DIAGNOSIS — K689 Other disorders of retroperitoneum: Secondary | ICD-10-CM | POA: Diagnosis not present

## 2014-09-18 DIAGNOSIS — I959 Hypotension, unspecified: Secondary | ICD-10-CM | POA: Diagnosis not present

## 2014-09-18 DIAGNOSIS — K9181 Other intraoperative complications of digestive system: Secondary | ICD-10-CM | POA: Diagnosis not present

## 2014-09-18 DIAGNOSIS — Z87891 Personal history of nicotine dependence: Secondary | ICD-10-CM | POA: Diagnosis not present

## 2014-09-18 DIAGNOSIS — J690 Pneumonitis due to inhalation of food and vomit: Secondary | ICD-10-CM | POA: Diagnosis not present

## 2014-09-18 DIAGNOSIS — K909 Intestinal malabsorption, unspecified: Secondary | ICD-10-CM | POA: Diagnosis present

## 2014-09-18 DIAGNOSIS — J9859 Other diseases of mediastinum, not elsewhere classified: Secondary | ICD-10-CM

## 2014-09-18 DIAGNOSIS — G4700 Insomnia, unspecified: Secondary | ICD-10-CM | POA: Diagnosis present

## 2014-09-18 DIAGNOSIS — Z885 Allergy status to narcotic agent status: Secondary | ICD-10-CM | POA: Diagnosis not present

## 2014-09-18 DIAGNOSIS — A419 Sepsis, unspecified organism: Secondary | ICD-10-CM | POA: Diagnosis not present

## 2014-09-18 DIAGNOSIS — R0902 Hypoxemia: Secondary | ICD-10-CM | POA: Diagnosis not present

## 2014-09-18 DIAGNOSIS — D509 Iron deficiency anemia, unspecified: Secondary | ICD-10-CM | POA: Diagnosis not present

## 2014-09-18 DIAGNOSIS — K317 Polyp of stomach and duodenum: Secondary | ICD-10-CM | POA: Diagnosis not present

## 2014-09-18 DIAGNOSIS — Z79891 Long term (current) use of opiate analgesic: Secondary | ICD-10-CM

## 2014-09-18 DIAGNOSIS — F19921 Other psychoactive substance use, unspecified with intoxication with delirium: Secondary | ICD-10-CM | POA: Diagnosis not present

## 2014-09-18 DIAGNOSIS — K2970 Gastritis, unspecified, without bleeding: Secondary | ICD-10-CM | POA: Diagnosis not present

## 2014-09-18 DIAGNOSIS — R0600 Dyspnea, unspecified: Secondary | ICD-10-CM | POA: Diagnosis not present

## 2014-09-18 DIAGNOSIS — F05 Delirium due to known physiological condition: Secondary | ICD-10-CM | POA: Diagnosis not present

## 2014-09-18 DIAGNOSIS — J4531 Mild persistent asthma with (acute) exacerbation: Secondary | ICD-10-CM | POA: Diagnosis not present

## 2014-09-18 DIAGNOSIS — G8929 Other chronic pain: Secondary | ICD-10-CM | POA: Diagnosis present

## 2014-09-18 DIAGNOSIS — K668 Other specified disorders of peritoneum: Secondary | ICD-10-CM | POA: Diagnosis present

## 2014-09-18 DIAGNOSIS — K567 Ileus, unspecified: Secondary | ICD-10-CM | POA: Diagnosis not present

## 2014-09-18 DIAGNOSIS — E8809 Other disorders of plasma-protein metabolism, not elsewhere classified: Secondary | ICD-10-CM | POA: Diagnosis present

## 2014-09-18 DIAGNOSIS — Z888 Allergy status to other drugs, medicaments and biological substances status: Secondary | ICD-10-CM

## 2014-09-18 DIAGNOSIS — K529 Noninfective gastroenteritis and colitis, unspecified: Secondary | ICD-10-CM | POA: Diagnosis present

## 2014-09-18 DIAGNOSIS — K631 Perforation of intestine (nontraumatic): Secondary | ICD-10-CM | POA: Diagnosis not present

## 2014-09-18 DIAGNOSIS — K589 Irritable bowel syndrome without diarrhea: Secondary | ICD-10-CM

## 2014-09-18 DIAGNOSIS — J309 Allergic rhinitis, unspecified: Secondary | ICD-10-CM | POA: Diagnosis present

## 2014-09-18 DIAGNOSIS — R7989 Other specified abnormal findings of blood chemistry: Secondary | ICD-10-CM | POA: Diagnosis present

## 2014-09-18 DIAGNOSIS — Z452 Encounter for adjustment and management of vascular access device: Secondary | ICD-10-CM

## 2014-09-18 DIAGNOSIS — E876 Hypokalemia: Secondary | ICD-10-CM | POA: Diagnosis present

## 2014-09-18 DIAGNOSIS — E669 Obesity, unspecified: Secondary | ICD-10-CM | POA: Diagnosis present

## 2014-09-18 DIAGNOSIS — R195 Other fecal abnormalities: Secondary | ICD-10-CM

## 2014-09-18 DIAGNOSIS — R778 Other specified abnormalities of plasma proteins: Secondary | ICD-10-CM | POA: Diagnosis present

## 2014-09-18 DIAGNOSIS — Z79899 Other long term (current) drug therapy: Secondary | ICD-10-CM

## 2014-09-18 DIAGNOSIS — J189 Pneumonia, unspecified organism: Secondary | ICD-10-CM | POA: Diagnosis not present

## 2014-09-18 DIAGNOSIS — F341 Dysthymic disorder: Secondary | ICD-10-CM | POA: Diagnosis not present

## 2014-09-18 DIAGNOSIS — M545 Low back pain: Secondary | ICD-10-CM | POA: Diagnosis not present

## 2014-09-18 DIAGNOSIS — R109 Unspecified abdominal pain: Secondary | ICD-10-CM | POA: Diagnosis not present

## 2014-09-18 DIAGNOSIS — J9811 Atelectasis: Secondary | ICD-10-CM | POA: Diagnosis not present

## 2014-09-18 DIAGNOSIS — J90 Pleural effusion, not elsewhere classified: Secondary | ICD-10-CM | POA: Diagnosis not present

## 2014-09-18 DIAGNOSIS — J181 Lobar pneumonia, unspecified organism: Secondary | ICD-10-CM | POA: Diagnosis not present

## 2014-09-18 DIAGNOSIS — J45909 Unspecified asthma, uncomplicated: Secondary | ICD-10-CM | POA: Diagnosis not present

## 2014-09-18 DIAGNOSIS — K352 Acute appendicitis with generalized peritonitis: Secondary | ICD-10-CM | POA: Diagnosis not present

## 2014-09-18 DIAGNOSIS — R10817 Generalized abdominal tenderness: Secondary | ICD-10-CM | POA: Diagnosis not present

## 2014-09-18 HISTORY — DX: Essential (primary) hypertension: I10

## 2014-09-18 HISTORY — DX: Perforation of intestine (nontraumatic): K63.1

## 2014-09-18 HISTORY — PX: LAPAROTOMY: SHX154

## 2014-09-18 LAB — CBC WITH DIFFERENTIAL/PLATELET
Basophils Absolute: 0 10*3/uL (ref 0.0–0.1)
Basophils Relative: 0 % (ref 0–1)
Eosinophils Absolute: 0.1 10*3/uL (ref 0.0–0.7)
Eosinophils Relative: 2 % (ref 0–5)
HCT: 36.8 % (ref 36.0–46.0)
Hemoglobin: 12.1 g/dL (ref 12.0–15.0)
Lymphocytes Relative: 22 % (ref 12–46)
Lymphs Abs: 1.3 10*3/uL (ref 0.7–4.0)
MCH: 31.9 pg (ref 26.0–34.0)
MCHC: 32.9 g/dL (ref 30.0–36.0)
MCV: 97.1 fL (ref 78.0–100.0)
Monocytes Absolute: 0.2 10*3/uL (ref 0.1–1.0)
Monocytes Relative: 3 % (ref 3–12)
Neutro Abs: 4.4 10*3/uL (ref 1.7–7.7)
Neutrophils Relative %: 73 % (ref 43–77)
Platelets: 214 10*3/uL (ref 150–400)
RBC: 3.79 MIL/uL — ABNORMAL LOW (ref 3.87–5.11)
RDW: 13.4 % (ref 11.5–15.5)
WBC: 5.9 10*3/uL (ref 4.0–10.5)

## 2014-09-18 LAB — BASIC METABOLIC PANEL
Anion gap: 9 (ref 5–15)
BUN: 6 mg/dL (ref 6–20)
CO2: 24 mmol/L (ref 22–32)
Calcium: 8.3 mg/dL — ABNORMAL LOW (ref 8.9–10.3)
Chloride: 105 mmol/L (ref 101–111)
Creatinine, Ser: 0.55 mg/dL (ref 0.44–1.00)
GFR calc Af Amer: >60 mL/min (ref >60)
GFR calc non Af Amer: >60 mL/min (ref >60)
Glucose, Bld: 103 mg/dL — ABNORMAL HIGH (ref 65–99)
Potassium: 3 mmol/L — ABNORMAL LOW (ref 3.5–5.1)
Sodium: 138 mmol/L (ref 135–145)

## 2014-09-18 SURGERY — LAPAROTOMY, EXPLORATORY
Anesthesia: General | Site: Abdomen

## 2014-09-18 MED ORDER — SODIUM CHLORIDE 0.9 % IV BOLUS (SEPSIS)
1000.0000 mL | Freq: Once | INTRAVENOUS | Status: AC
  Administered 2014-09-18: 1000 mL via INTRAVENOUS

## 2014-09-18 MED ORDER — ONDANSETRON HCL 4 MG/2ML IJ SOLN
4.0000 mg | Freq: Four times a day (QID) | INTRAMUSCULAR | Status: DC | PRN
  Administered 2014-09-18: 4 mg via INTRAVENOUS

## 2014-09-18 MED ORDER — POTASSIUM CHLORIDE 10 MEQ/100ML IV SOLN
10.0000 meq | INTRAVENOUS | Status: AC
  Administered 2014-09-18: 10 meq via INTRAVENOUS
  Filled 2014-09-18: qty 100

## 2014-09-18 MED ORDER — MIDAZOLAM HCL 2 MG/2ML IJ SOLN
INTRAMUSCULAR | Status: AC
  Filled 2014-09-18: qty 2

## 2014-09-18 MED ORDER — NEOSTIGMINE METHYLSULFATE 10 MG/10ML IV SOLN
INTRAVENOUS | Status: DC | PRN
  Administered 2014-09-18: 5 mg via INTRAVENOUS

## 2014-09-18 MED ORDER — KCL IN DEXTROSE-NACL 20-5-0.45 MEQ/L-%-% IV SOLN
INTRAVENOUS | Status: AC
  Filled 2014-09-18: qty 1000

## 2014-09-18 MED ORDER — ACETAMINOPHEN 10 MG/ML IV SOLN
1000.0000 mg | Freq: Four times a day (QID) | INTRAVENOUS | Status: DC
  Administered 2014-09-18: 1000 mg via INTRAVENOUS

## 2014-09-18 MED ORDER — IOHEXOL 300 MG/ML  SOLN
80.0000 mL | Freq: Once | INTRAMUSCULAR | Status: AC | PRN
  Administered 2014-09-18: 80 mL via INTRAVENOUS

## 2014-09-18 MED ORDER — SUCCINYLCHOLINE CHLORIDE 20 MG/ML IJ SOLN
INTRAMUSCULAR | Status: AC
  Filled 2014-09-18: qty 1

## 2014-09-18 MED ORDER — ENOXAPARIN SODIUM 30 MG/0.3ML ~~LOC~~ SOLN
30.0000 mg | Freq: Two times a day (BID) | SUBCUTANEOUS | Status: DC
  Administered 2014-09-19 – 2014-09-26 (×13): 30 mg via SUBCUTANEOUS
  Filled 2014-09-18 (×20): qty 0.3

## 2014-09-18 MED ORDER — ONDANSETRON HCL 4 MG/2ML IJ SOLN
4.0000 mg | Freq: Once | INTRAMUSCULAR | Status: AC | PRN
  Administered 2014-09-18: 4 mg via INTRAVENOUS

## 2014-09-18 MED ORDER — CEFTRIAXONE SODIUM 1 G IJ SOLR
1.0000 g | Freq: Once | INTRAMUSCULAR | Status: AC
  Administered 2014-09-18: 1 g via INTRAVENOUS
  Filled 2014-09-18: qty 10

## 2014-09-18 MED ORDER — GLYCOPYRROLATE 0.2 MG/ML IJ SOLN
INTRAMUSCULAR | Status: DC | PRN
  Administered 2014-09-18: .8 mg via INTRAVENOUS

## 2014-09-18 MED ORDER — ONDANSETRON HCL 4 MG/2ML IJ SOLN
INTRAMUSCULAR | Status: AC
Start: 2014-09-18 — End: 2014-09-18
  Administered 2014-09-18: 4 mg via INTRAVENOUS
  Filled 2014-09-18: qty 2

## 2014-09-18 MED ORDER — METRONIDAZOLE IN NACL 5-0.79 MG/ML-% IV SOLN
500.0000 mg | Freq: Once | INTRAVENOUS | Status: AC
  Administered 2014-09-18: 500 mg via INTRAVENOUS
  Filled 2014-09-18: qty 100

## 2014-09-18 MED ORDER — FENTANYL CITRATE (PF) 100 MCG/2ML IJ SOLN
25.0000 ug | INTRAMUSCULAR | Status: DC | PRN
  Administered 2014-09-18 (×2): 50 ug via INTRAVENOUS

## 2014-09-18 MED ORDER — ACETAMINOPHEN 10 MG/ML IV SOLN
INTRAVENOUS | Status: AC
  Administered 2014-09-18: 1000 mg via INTRAVENOUS
  Filled 2014-09-18: qty 100

## 2014-09-18 MED ORDER — MIDAZOLAM HCL 5 MG/5ML IJ SOLN
INTRAMUSCULAR | Status: DC | PRN
  Administered 2014-09-18: 2 mg via INTRAVENOUS

## 2014-09-18 MED ORDER — PROPOFOL 10 MG/ML IV BOLUS
INTRAVENOUS | Status: AC
  Filled 2014-09-18: qty 20

## 2014-09-18 MED ORDER — LACTATED RINGERS IV SOLN
INTRAVENOUS | Status: DC | PRN
  Administered 2014-09-18: 16:00:00 via INTRAVENOUS

## 2014-09-18 MED ORDER — SODIUM CHLORIDE 0.9 % IV SOLN
INTRAVENOUS | Status: DC
  Administered 2014-09-18: 125 mL/h via INTRAVENOUS

## 2014-09-18 MED ORDER — COLESTIPOL HCL 5 G PO GRAN: 5.0000 g | GRANULES | Freq: Every day | ORAL | Status: DC

## 2014-09-18 MED ORDER — 0.9 % SODIUM CHLORIDE (POUR BTL) OPTIME
TOPICAL | Status: DC | PRN
  Administered 2014-09-18: 4000 mL

## 2014-09-18 MED ORDER — HYDROMORPHONE HCL 1 MG/ML IJ SOLN: 1.0000 mg | INTRAMUSCULAR | Status: DC | PRN

## 2014-09-18 MED ORDER — SODIUM CHLORIDE 0.9 % IJ SOLN: 9.0000 mL | INTRAMUSCULAR | Status: DC | PRN

## 2014-09-18 MED ORDER — FENTANYL CITRATE (PF) 100 MCG/2ML IJ SOLN
INTRAMUSCULAR | Status: AC
  Administered 2014-09-18: 50 ug via INTRAVENOUS
  Filled 2014-09-18: qty 2

## 2014-09-18 MED ORDER — ONDANSETRON HCL 4 MG/2ML IJ SOLN
INTRAMUSCULAR | Status: AC
  Administered 2014-09-18: 4 mg via INTRAVENOUS
  Filled 2014-09-18: qty 2

## 2014-09-18 MED ORDER — LORAZEPAM BOLUS VIA INFUSION
0.5000 mg | Freq: Three times a day (TID) | INTRAVENOUS | Status: DC | PRN
  Filled 2014-09-18: qty 1

## 2014-09-18 MED ORDER — FENTANYL CITRATE (PF) 100 MCG/2ML IJ SOLN
75.0000 ug | Freq: Once | INTRAMUSCULAR | Status: AC
  Administered 2014-09-18: 75 ug via INTRAVENOUS
  Filled 2014-09-18: qty 2

## 2014-09-18 MED ORDER — KCL IN DEXTROSE-NACL 20-5-0.9 MEQ/L-%-% IV SOLN
INTRAVENOUS | Status: DC
  Administered 2014-09-19: 23:00:00 via INTRAVENOUS
  Administered 2014-09-19 (×2): 125 mL/h via INTRAVENOUS
  Administered 2014-09-20 (×2): via INTRAVENOUS
  Administered 2014-09-20: 125 mL/h via INTRAVENOUS
  Administered 2014-09-21 – 2014-09-22 (×3): via INTRAVENOUS
  Filled 2014-09-18 (×19): qty 1000

## 2014-09-18 MED ORDER — PROPOFOL 10 MG/ML IV BOLUS
INTRAVENOUS | Status: DC | PRN
  Administered 2014-09-18: 150 mg via INTRAVENOUS

## 2014-09-18 MED ORDER — DIPHENHYDRAMINE HCL 12.5 MG/5ML PO ELIX
12.5000 mg | ORAL_SOLUTION | Freq: Four times a day (QID) | ORAL | Status: DC | PRN
  Filled 2014-09-18: qty 5

## 2014-09-18 MED ORDER — PHENYLEPHRINE HCL 10 MG/ML IJ SOLN
10.0000 mg | INTRAMUSCULAR | Status: DC | PRN
  Administered 2014-09-18: 25 ug/min via INTRAVENOUS
  Administered 2014-09-18: 25 ug via INTRAVENOUS

## 2014-09-18 MED ORDER — ONDANSETRON HCL 4 MG/2ML IJ SOLN
4.0000 mg | Freq: Four times a day (QID) | INTRAMUSCULAR | Status: DC | PRN
  Administered 2014-09-24: 4 mg via INTRAVENOUS
  Filled 2014-09-18 (×2): qty 2

## 2014-09-18 MED ORDER — ROCURONIUM BROMIDE 50 MG/5ML IV SOLN
INTRAVENOUS | Status: AC
  Filled 2014-09-18: qty 1

## 2014-09-18 MED ORDER — HYDROMORPHONE 0.3 MG/ML IV SOLN
INTRAVENOUS | Status: DC
  Administered 2014-09-18: 20:00:00 via INTRAVENOUS
  Administered 2014-09-19 (×2): 0.9 mg via INTRAVENOUS
  Administered 2014-09-19: 0.6 mg via INTRAVENOUS
  Administered 2014-09-19: 2 mg via INTRAVENOUS
  Administered 2014-09-19: 0.9 mg via INTRAVENOUS
  Administered 2014-09-19: 1.5 mg via INTRAVENOUS
  Administered 2014-09-20 (×2): 0.6 mg via INTRAVENOUS
  Administered 2014-09-20 (×2): 0.3 mg via INTRAVENOUS
  Administered 2014-09-20: 0.1 mg via INTRAVENOUS
  Administered 2014-09-21: 0 mg via INTRAVENOUS
  Filled 2014-09-18: qty 25

## 2014-09-18 MED ORDER — METOCLOPRAMIDE HCL 5 MG/ML IJ SOLN
INTRAMUSCULAR | Status: AC
Start: 2014-09-18 — End: 2014-09-18
  Administered 2014-09-18: 10 mg
  Filled 2014-09-18: qty 2

## 2014-09-18 MED ORDER — HYDROMORPHONE HCL 1 MG/ML IJ SOLN
1.0000 mg | Freq: Once | INTRAMUSCULAR | Status: AC
  Administered 2014-09-18: 1 mg via INTRAVENOUS
  Filled 2014-09-18: qty 1

## 2014-09-18 MED ORDER — ONDANSETRON HCL 4 MG/2ML IJ SOLN
4.0000 mg | Freq: Once | INTRAMUSCULAR | Status: AC
  Administered 2014-09-18: 4 mg via INTRAVENOUS
  Filled 2014-09-18: qty 2

## 2014-09-18 MED ORDER — ROCURONIUM BROMIDE 100 MG/10ML IV SOLN
INTRAVENOUS | Status: DC | PRN
  Administered 2014-09-18: 50 mg via INTRAVENOUS

## 2014-09-18 MED ORDER — DIPHENOXYLATE-ATROPINE 2.5-0.025 MG PO TABS: 2.0000 | ORAL_TABLET | Freq: Two times a day (BID) | ORAL | Status: DC

## 2014-09-18 MED ORDER — HYDROMORPHONE 0.3 MG/ML IV SOLN
INTRAVENOUS | Status: AC
  Filled 2014-09-18: qty 25

## 2014-09-18 MED ORDER — FENTANYL CITRATE (PF) 100 MCG/2ML IJ SOLN
INTRAMUSCULAR | Status: DC | PRN
  Administered 2014-09-18: 50 ug via INTRAVENOUS
  Administered 2014-09-18: 100 ug via INTRAVENOUS

## 2014-09-18 MED ORDER — NALOXONE HCL 0.4 MG/ML IJ SOLN: 0.4000 mg | INTRAMUSCULAR | Status: DC | PRN

## 2014-09-18 MED ORDER — SODIUM CHLORIDE 0.9 % IV SOLN: 500.0000 mL | INTRAVENOUS | Status: DC

## 2014-09-18 MED ORDER — LIDOCAINE HCL (CARDIAC) 20 MG/ML IV SOLN
INTRAVENOUS | Status: DC | PRN
  Administered 2014-09-18: 100 mg via INTRAVENOUS

## 2014-09-18 MED ORDER — FENTANYL CITRATE (PF) 250 MCG/5ML IJ SOLN
INTRAMUSCULAR | Status: AC
  Filled 2014-09-18: qty 5

## 2014-09-18 MED ORDER — DEXTROSE 5 % IV SOLN
2.0000 g | Freq: Three times a day (TID) | INTRAVENOUS | Status: DC
  Administered 2014-09-19 – 2014-09-20 (×5): 2 g via INTRAVENOUS
  Filled 2014-09-18 (×11): qty 2

## 2014-09-18 MED ORDER — DIPHENHYDRAMINE HCL 50 MG/ML IJ SOLN
12.5000 mg | Freq: Four times a day (QID) | INTRAMUSCULAR | Status: DC | PRN
  Administered 2014-09-20: 12.5 mg via INTRAVENOUS
  Filled 2014-09-18 (×2): qty 1

## 2014-09-18 MED ORDER — LIDOCAINE HCL (CARDIAC) 20 MG/ML IV SOLN
INTRAVENOUS | Status: AC
  Filled 2014-09-18: qty 5

## 2014-09-18 MED ORDER — ONDANSETRON HCL 4 MG/2ML IJ SOLN
4.0000 mg | Freq: Four times a day (QID) | INTRAMUSCULAR | Status: DC | PRN
  Administered 2014-09-18 – 2014-09-19 (×3): 4 mg via INTRAVENOUS
  Filled 2014-09-18: qty 2

## 2014-09-18 SURGICAL SUPPLY — 53 items
BLADE SURG ROTATE 9660 (MISCELLANEOUS) IMPLANT
BNDG GAUZE ELAST 4 BULKY (GAUZE/BANDAGES/DRESSINGS) ×2 IMPLANT
CANISTER SUCTION 2500CC (MISCELLANEOUS) ×2 IMPLANT
CHLORAPREP W/TINT 26ML (MISCELLANEOUS) ×2 IMPLANT
COVER MAYO STAND STRL (DRAPES) IMPLANT
COVER SURGICAL LIGHT HANDLE (MISCELLANEOUS) ×2 IMPLANT
DRAPE LAPAROSCOPIC ABDOMINAL (DRAPES) ×2 IMPLANT
DRAPE PROXIMA HALF (DRAPES) IMPLANT
DRAPE UTILITY XL STRL (DRAPES) IMPLANT
DRAPE WARM FLUID 44X44 (DRAPE) ×2 IMPLANT
DRSG OPSITE POSTOP 4X10 (GAUZE/BANDAGES/DRESSINGS) IMPLANT
DRSG OPSITE POSTOP 4X8 (GAUZE/BANDAGES/DRESSINGS) IMPLANT
ELECT BLADE 6.5 EXT (BLADE) IMPLANT
ELECT CAUTERY BLADE 6.4 (BLADE) ×2 IMPLANT
ELECT REM PT RETURN 9FT ADLT (ELECTROSURGICAL) ×2
ELECTRODE REM PT RTRN 9FT ADLT (ELECTROSURGICAL) ×1 IMPLANT
GLOVE BIO SURGEON STRL SZ 6.5 (GLOVE) ×2 IMPLANT
GLOVE BIO SURGEON STRL SZ7.5 (GLOVE) ×2 IMPLANT
GLOVE BIO SURGEON STRL SZ8 (GLOVE) ×2 IMPLANT
GLOVE BIOGEL PI IND STRL 6 (GLOVE) ×1 IMPLANT
GLOVE BIOGEL PI IND STRL 6.5 (GLOVE) ×1 IMPLANT
GLOVE BIOGEL PI IND STRL 8 (GLOVE) ×1 IMPLANT
GLOVE BIOGEL PI INDICATOR 6 (GLOVE) ×1
GLOVE BIOGEL PI INDICATOR 6.5 (GLOVE) ×1
GLOVE BIOGEL PI INDICATOR 8 (GLOVE) ×1
GOWN STRL REUS W/ TWL LRG LVL3 (GOWN DISPOSABLE) ×2 IMPLANT
GOWN STRL REUS W/ TWL XL LVL3 (GOWN DISPOSABLE) ×1 IMPLANT
GOWN STRL REUS W/TWL LRG LVL3 (GOWN DISPOSABLE) ×2
GOWN STRL REUS W/TWL XL LVL3 (GOWN DISPOSABLE) ×1
KIT BASIN OR (CUSTOM PROCEDURE TRAY) ×2 IMPLANT
KIT ROOM TURNOVER OR (KITS) ×2 IMPLANT
LIGASURE IMPACT 36 18CM CVD LR (INSTRUMENTS) IMPLANT
NS IRRIG 1000ML POUR BTL (IV SOLUTION) ×4 IMPLANT
PACK GENERAL/GYN (CUSTOM PROCEDURE TRAY) ×2 IMPLANT
PAD ABD 8X10 STRL (GAUZE/BANDAGES/DRESSINGS) ×4 IMPLANT
PAD ARMBOARD 7.5X6 YLW CONV (MISCELLANEOUS) ×2 IMPLANT
PENCIL BUTTON HOLSTER BLD 10FT (ELECTRODE) IMPLANT
SPECIMEN JAR LARGE (MISCELLANEOUS) IMPLANT
SPONGE LAP 18X18 X RAY DECT (DISPOSABLE) IMPLANT
STAPLER PROXIMATE 75MM BLUE (STAPLE) ×2 IMPLANT
STAPLER VISISTAT 35W (STAPLE) ×2 IMPLANT
SUCTION POOLE TIP (SUCTIONS) ×2 IMPLANT
SUT PDS AB 1 TP1 96 (SUTURE) ×2 IMPLANT
SUT SILK 2 0 SH CR/8 (SUTURE) ×2 IMPLANT
SUT SILK 2 0 TIES 10X30 (SUTURE) ×2 IMPLANT
SUT SILK 3 0 SH CR/8 (SUTURE) ×2 IMPLANT
SUT SILK 3 0 TIES 10X30 (SUTURE) ×2 IMPLANT
SUT VIC AB 3-0 SH 18 (SUTURE) IMPLANT
TOWEL OR 17X24 6PK STRL BLUE (TOWEL DISPOSABLE) ×2 IMPLANT
TOWEL OR 17X26 10 PK STRL BLUE (TOWEL DISPOSABLE) IMPLANT
TRAY FOLEY CATH 16FRSI W/METER (SET/KITS/TRAYS/PACK) ×2 IMPLANT
TUBE CONNECTING 12X1/4 (SUCTIONS) IMPLANT
YANKAUER SUCT BULB TIP NO VENT (SUCTIONS) IMPLANT

## 2014-09-18 NOTE — Transfer of Care (Signed)
Immediate Anesthesia Transfer of Care Note  Patient: Alexis Olson  Procedure(s) Performed: Procedure(s): EXPLORATORY LAPAROTOMY WITH REPAIR OF CECIAL PERFORATION (N/A)  Patient Location: PACU  Anesthesia Type:General  Level of Consciousness: awake  Airway & Oxygen Therapy: Patient Spontanous Breathing and Patient connected to face mask oxygen  Post-op Assessment: Report given to RN and Post -op Vital signs reviewed and stable  Post vital signs: Reviewed and stable  Last Vitals:  Filed Vitals:   09/18/14 1545  BP: 106/57  Pulse: 108  Temp:   Resp: 18    Complications: No apparent anesthesia complications

## 2014-09-18 NOTE — ED Notes (Signed)
Pt transported to OR. No belongings family with patient

## 2014-09-18 NOTE — Progress Notes (Signed)
Called to room to assist during endoscopic procedure.  Patient ID and intended procedure confirmed with present staff. Received instructions for my participation in the procedure from the performing physician.  

## 2014-09-18 NOTE — ED Notes (Signed)
Pt sent from [facility] after having endoscopy with polyp removal. Pt states she started having sereve lower abd pain after procedure.

## 2014-09-18 NOTE — ED Notes (Signed)
Pt transported to xray 

## 2014-09-18 NOTE — Progress Notes (Signed)
Results of expl. Lap and repair of cecal perforation noted. Pt stable after the surgery. See op note by Geralynn Ochs. I have spoken to pt's daughters and husband.

## 2014-09-18 NOTE — H&P (Signed)
Chief Complaint: pneumoperitoneum  HPI: Alexis Olson is a 82 year old female with a history of asthma, "intestinal bypass" around age 58 and a cholecystectomy presenting from Dr. Nichola Sizer office with worsening abdominal pain following a colonoscopy.  Location is lower abdomen.  Time pattern is constant.  Severe in severity.  Characterized as sharp pain.  modifying factors include; levsin.  No aggravating or alleviating factors.  No modifying factors. Denies nausea or vomiting.  Denies chills or sweats.  Her work up shows by AXR shows significant pneumoperitoneum.  We have therefore been asked to evaluate.  Normal white count, K 3, normal renal function.    Past Medical History  Diagnosis Date  . Obesity   . Lumbosacral disc disease     Chronic pain; History of osteomyelitis 2010 following ESI complication  . ALLERGIC RHINITIS   . 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 ANXIETY   . ASTHMA   . Bariatric surgery status   . COLONIC POLYPS, ADENOMATOUS, HX OF 2010  . DIVERTICULOSIS, COLON   . INSOMNIA   . URINARY URGENCY   . VITAMIN B12 DEFICIENCY   . Hypotension     pt denies    Past Surgical History  Procedure Laterality Date  . Cholecystectomy    . Tonsillectomy    . Ileojejunal bypass  1976    for obesity  . Back surgery  1994    for ruptured disc  . Jejunoileal bypass  1960    bariatric surgery    Family History  Problem Relation Age of Onset  . Cervical cancer Mother   . Liver disease Sister   . Kidney disease Sister   . Cirrhosis Sister   . Colon cancer Neg Hx   . Esophageal cancer Neg Hx   . Rectal cancer Neg Hx   . Stomach cancer Neg Hx    Social History:  reports that she has quit smoking. She has never used smokeless tobacco. She reports that she does not drink alcohol or use illicit drugs.  Allergies:  Allergies  Allergen Reactions  . Azithromycin     REACTION: unspecified  . Ciprofloxacin Hcl     REACTION:  causes yeast inf  and refuses to take  . Codeine Phosphate     REACTION: unspecified  . Hydrocodone   . Hydrocodone-Homatropine     REACTION: unspecified  . Levaquin [Levofloxacin In D5w] Other (See Comments)    Muscle soreness  . Meperidine Hcl   . Sulfa Antibiotics Other (See Comments)    Joint pain  . Ultram [Tramadol Hcl]      (Not in a hospital admission)  Results for orders placed or performed during the hospital encounter of 09/18/14 (from the past 48 hour(s))  CBC with Differential     Status: Abnormal   Collection Time: 09/18/14  1:59 PM  Result Value Ref Range   WBC 5.9 4.0 - 10.5 K/uL   RBC 3.79 (L) 3.87 - 5.11 MIL/uL   Hemoglobin 12.1 12.0 - 15.0 g/dL   HCT 36.8 36.0 - 46.0 %   MCV 97.1 78.0 - 100.0 fL   MCH 31.9 26.0 - 34.0 pg   MCHC 32.9 30.0 - 36.0 g/dL   RDW 13.4 11.5 - 15.5 %   Platelets 214 150 - 400 K/uL   Neutrophils Relative % 73 43 - 77 %   Neutro Abs 4.4 1.7 - 7.7 K/uL   Lymphocytes Relative 22 12 -  46 %   Lymphs Abs 1.3 0.7 - 4.0 K/uL   Monocytes Relative 3 3 - 12 %   Monocytes Absolute 0.2 0.1 - 1.0 K/uL   Eosinophils Relative 2 0 - 5 %   Eosinophils Absolute 0.1 0.0 - 0.7 K/uL   Basophils Relative 0 0 - 1 %   Basophils Absolute 0.0 0.0 - 0.1 K/uL  Basic metabolic panel     Status: Abnormal   Collection Time: 09/18/14  1:59 PM  Result Value Ref Range   Sodium 138 135 - 145 mmol/L   Potassium 3.0 (L) 3.5 - 5.1 mmol/L   Chloride 105 101 - 111 mmol/L   CO2 24 22 - 32 mmol/L   Glucose, Bld 103 (H) 65 - 99 mg/dL   BUN 6 6 - 20 mg/dL   Creatinine, Ser 0.55 0.44 - 1.00 mg/dL   Calcium 8.3 (L) 8.9 - 10.3 mg/dL   GFR calc non Af Amer >60 >60 mL/min   GFR calc Af Amer >60 >60 mL/min    Comment: (NOTE) The eGFR has been calculated using the CKD EPI equation. This calculation has not been validated in all clinical situations. eGFR's persistently <60 mL/min signify possible Chronic Kidney Disease.    Anion gap 9 5 - 15   Dg Abd Acute  W/chest  09/18/2014   CLINICAL DATA:  Status post endoscopy with polypectomy and abdominal pain  EXAM: DG ABDOMEN ACUTE W/ 1V CHEST  COMPARISON:  None.  FINDINGS: There is a considerable amount of free air within the abdomen noted most prominently on the decubitus view. The bowel gas pattern is otherwise within normal limits. Changes of prior cholecystectomy are seen. The chest shows no focal infiltrate but air is again noted beneath the right hemidiaphragm.  IMPRESSION: Significant pneumoperitoneum likely related to the recent polypectomy.  Critical Value/emergent results were called by telephone at the time of interpretation on 09/18/2014 at 2:32 pm to Dr. Virgel Manifold , who verbally acknowledged these results.   Electronically Signed   By: Inez Catalina M.D.   On: 09/18/2014 14:33    Review of Systems  All other systems reviewed and are negative.   Blood pressure 111/64, pulse 95, temperature 97.5 F (36.4 C), temperature source Oral, resp. rate 16, height _0  (1.651 m), weight 86.183 kg (190 lb), SpO2 99 %. Physical Exam  Constitutional: She is oriented to person, place, and time. She appears well-developed and well-nourished. She appears distressed.  HENT:  Head: Normocephalic and atraumatic.  Mouth/Throat: No oropharyngeal exudate.  Eyes: Right eye exhibits no discharge. Left eye exhibits no discharge. No scleral icterus.  Neck: Normal range of motion. Neck supple.  Cardiovascular: Normal rate and regular rhythm.  Exam reveals no gallop and no friction rub.   No murmur heard. Respiratory: Effort normal and breath sounds normal. No respiratory distress. She has no wheezes. She has no rales. She exhibits no tenderness.  GI: She exhibits distension. She exhibits no mass. There is tenderness. There is rebound and guarding.  Hypoactive bowel sounds, tympanic, firm.  Musculoskeletal: Normal range of motion. She exhibits no edema or tenderness.  Lymphadenopathy:    She has no cervical  adenopathy.  Neurological: She is alert and oriented to person, place, and time.  Skin: Skin is warm and dry. No rash noted. She is not diaphoretic. No erythema. No pallor.  Psychiatric: Judgment and thought content normal.  Anxious      Assessment/Plan S/p colonoscopy Pneumoperitoneum -To OR urgently for an exploratory  laparotomy, possible bowel resection and colostomy.  Surgical risks discussed including but not limited for infection, bleeding, injury to surrounding structures, ostomy, need for repeat surgeries, anesthesia risks.  The patient verbalizes understanding and wishes to proceed -give Cefoxitin, d/w pharmacy -IVF -dilaudid for pain -NPO -ativan for anxiety Hypokalemia-give 40 KCL now, IVF   RIEBOCK, EMINA ANP-BC 09/18/2014, 3:19 PM

## 2014-09-18 NOTE — Anesthesia Procedure Notes (Signed)
Procedure Name: Intubation Date/Time: 09/18/2014 4:57 PM Performed by: Manus Gunning, HAL J Pre-anesthesia Checklist: Patient identified, Timeout performed, Emergency Drugs available, Suction available and Patient being monitored Patient Re-evaluated:Patient Re-evaluated prior to inductionOxygen Delivery Method: Circle system utilized Preoxygenation: Pre-oxygenation with 100% oxygen Intubation Type: IV induction Ventilation: Mask ventilation without difficulty Laryngoscope Size: Mac and 3 Grade View: Grade I Tube type: Oral Tube size: 7.0 mm Number of attempts: 1 Placement Confirmation: ETT inserted through vocal cords under direct vision,  breath sounds checked- equal and bilateral and positive ETCO2 Secured at: 21 cm Tube secured with: Tape Dental Injury: Teeth and Oropharynx as per pre-operative assessment

## 2014-09-18 NOTE — Progress Notes (Signed)
Pt arrived to Recovery moaning and grimacing, initially passed air x 2 and has not passed air since. Levsin 0.25 mg given sublingual and patient stated some relief. Abdomen had initially been tight but gradually became much softer with palpation. When patient still much quieter and states improved although still rates 6/10. When turning from right to left, pain become more intense, attempted rectal tube without any success. Upon movement, patient becomes diaphoretic, pale and with increasing discomfort . Attempted to ambulate to bathroom, unable due to pain. Dr. Olevia Perches assessed and decided for transfer to [facility] ED for KUB and evaluation.

## 2014-09-18 NOTE — Anesthesia Preprocedure Evaluation (Addendum)
Anesthesia Evaluation  Patient identified by MRN, date of birth, ID band Patient awake    Reviewed: Allergy & Precautions, NPO status , Patient's Chart, lab work & pertinent test results, Unable to perform ROS - Chart review only  Airway Mallampati: II  TM Distance: >3 FB Neck ROM: Full    Dental  (+) Teeth Intact, Dental Advisory Given   Pulmonary former smoker,  breath sounds clear to auscultation        Cardiovascular Rhythm:Regular Rate:Normal     Neuro/Psych    GI/Hepatic   Endo/Other    Renal/GU      Musculoskeletal   Abdominal   Peds  Hematology   Anesthesia Other Findings   Reproductive/Obstetrics                            Anesthesia Physical Anesthesia Plan  ASA: III and emergent  Anesthesia Plan: General   Post-op Pain Management:    Induction: Intravenous  Airway Management Planned: Oral ETT  Additional Equipment:   Intra-op Plan:   Post-operative Plan: Extubation in OR  Informed Consent: I have reviewed the patients History and Physical, chart, labs and discussed the procedure including the risks, benefits and alternatives for the proposed anesthesia with the patient or authorized representative who has indicated his/her understanding and acceptance.   Dental advisory given  Plan Discussed with: CRNA and Anesthesiologist  Anesthesia Plan Comments:         Anesthesia Quick Evaluation

## 2014-09-18 NOTE — Progress Notes (Signed)
Brief Progress Note     09/18/2014, 2:41 PM    SUBJECTIVE:       Pt sent to ED from [facility].  Had c/o severe abd pain after colonoscopy with piecemeal hot snare removal of cecal polyp as well as EGD. Plain abdominal films in ED show perforation with pneumoperitoneum.  ED doc has given pt dose of Rocephin and Flagyl.  General surgery had been contacted.   OBJECTIVE:         Vital signs in last 24 hours:    Temp:  [96.9 F (36.1 C)-97.5 F (36.4 C)] 97.5 F (36.4 C) (06/24 1320) Pulse Rate:  [64-98] 88 (06/24 1435) Resp:  [9-24] 16 (06/24 1435) BP: (65-141)/(35-84) 95/54 mmHg (06/24 1435) SpO2:  [90 %-100 %] 94 % (06/24 1435) Weight:  [190 lb (86.183 kg)-192 lb (87.091 kg)] 190 lb (86.183 kg) (06/24 1320)   Filed Weights   09/18/14 1320  Weight: 190 lb (86.183 kg)   Lab Results:  Recent Labs  09/15/14 1636 09/18/14 1359  WBC 8.4 5.9  HGB 11.6* 12.1  HCT 35.2* 36.8  PLT 272.0 214   BMET  Recent Labs  09/15/14 1636 09/18/14 1359  NA 135 138  K 3.4* 3.0*  CL 102 105  CO2 27 24  GLUCOSE 93 103*  BUN 11 6  CREATININE 0.54 0.55  CALCIUM 9.1 8.3*   LFT  Recent Labs  09/15/14 1636  PROT 6.8  ALBUMIN 4.0  AST 15  ALT 14  ALKPHOS 47  BILITOT 0.3    Studies/Results: Dg Abd Acute W/chest  09/18/2014   CLINICAL DATA:  Status post endoscopy with polypectomy and abdominal pain  EXAM: DG ABDOMEN ACUTE W/ 1V CHEST  COMPARISON:  None.  FINDINGS: There is a considerable amount of free air within the abdomen noted most prominently on the decubitus view. The bowel gas pattern is otherwise within normal limits. Changes of prior cholecystectomy are seen. The chest shows no focal infiltrate but air is again noted beneath the right hemidiaphragm.  IMPRESSION: Significant pneumoperitoneum likely related to the recent polypectomy.  Critical Value/emergent results were called by telephone at the time of interpretation on 09/18/2014  at 2:32 pm to Dr. Virgel Manifold , who verbally acknowledged these results.   Electronically Signed   By: Inez Catalina M.D.   On: 09/18/2014 14:33    09/18/14 EGD: for chronic iron def anemia, FOBT + stool. Fundic gland polyps: biopsied.  Did not reach jejunoileal biopsy anastomosis.  Small, reducible HH with non-obstructing fibrous ring.  09/18/14 Colonoscopy for chronic IDA, FOBT +, tubulovillous adenoma 2010, chronic elvation CEA, + IBD markers, chronic loose stools.  Sessile polyp at cecum, piece meal removal with snare cautery.  Random biopsies of colon to r/o microscopic colitis. Mild descending diverticulosis.   ASSESMENT:   *  Colon perforation.  Pneumoperitoneum after piecemeal polypectomy this AM.  *  Previous bariatric surgery. Jejunoileal bypass mid 1970s. Hyperplastic gastric polyp 08/2008.  *  Chronic anemia. Felt to be due to malabsorption related to gastric bypass.  SB biopsies 08/2008 negative for villous atrophy/sprue.    PLAN   *  Surgery consult called. They are taking her urgently to OR.      Alexis Olson  09/18/2014, 2:41 PM Pager: [phone] Attending MD note:   I have taken a history, examined the patient, and reviewed the chart She underwent surveillance colonoscopy for follow up of adenomatous polyp  earlier todayI  . She developed abdominal pain within 20 minutes  of the procedure, immediately called EMT to transfer to ED for evaluation to r/o  Cecal burn/postpolypectomy perforation of a large 15 mm carpeted polyp. I have spoken to ED MD DR Wilson Singer as well as to the pt and her daughter Ms Alexis Olson who works in ED. Dr Marlou Starks is the surgeon on call, Preparation under way for emergency lap and possible colostomy.  Will follow,   [facility] Pager # [phone]

## 2014-09-18 NOTE — Progress Notes (Signed)
KUB shows pneumoperitoneum as per DR Wilson Singer,  Surgical consult requested. I have talked to the patient as well as her daughter Ms Clydene Laming. Pt will be having surgery as soon as Dr Marlou Starks will evaluate.

## 2014-09-18 NOTE — Op Note (Signed)
[place]  [address] [place], [address]   ENDOSCOPY PROCEDURE REPORT  PATIENT: Alexis, Olson  MR#: [id number] BIRTHDATE: April 15, 1933 , 81  yrs. old GENDER: female ENDOSCOPIST: Lafayette Dragon, MD REFERRED BY:  Gwendolyn Grant, M.D. PROCEDURE DATE:  09/18/2014 PROCEDURE:  EGD w/ biopsy ASA CLASS:     Class III INDICATIONS:  iron deficiency anemia, occult blood positive, and history of jejunal ileal bypass in 1960.  Chronic iron deficiency. Heme-positive stool.  Last upper endoscopy and enteroscopy in June 2010. MEDICATIONS: Monitored anesthesia care and Propofol 150 mg IV TOPICAL ANESTHETIC: none  DESCRIPTION OF PROCEDURE: After the risks benefits and alternatives of the procedure were thoroughly explained, informed consent was obtained.  The [id number] endoscope was introduced through the mouth and advanced to the second portion of the duodenum , Without limitations.  The instrument was slowly withdrawn as the mucosa was fully examined.    [Esophagus: normal mucosa of  the proximal ,mid and distal esophagus. No stricture ,Small hiatal hernia with nonobstructing fibrous ring  Stomach: several small fundic gland polyps in the gastric body. Biopsies obtained. Retroflexion of the endoscope revealed normal fundus and cardia. Normal gastric outlet  Duodenum: duodenal bulb and descending duodenum was normal The scope was then withdrawn from the patient and the procedure completed.  COMPLICATIONS: There were no immediate complications.  ENDOSCOPIC IMPRESSION:  1.essentially normal upper endoscopy of esophagus stomach and duodenum 2.Fundic gland polyps status post biopsies 3.History of jejunoileal bypass. Anastomosis not reached endoscopically 4.small reducible hiatal hernia, with nonobstructing fibrous ring  RECOMMENDATIONS: 1.  Await pathology results 2.  Proceed with colonoscopy  REPEAT EXAM: for EGD pending biopsy  results.  eSigned:  Lafayette Dragon, MD 09/18/2014 11:26 AM    CC:  PATIENT NAME:  Alexis, Olson MR#: [id number]

## 2014-09-18 NOTE — Progress Notes (Addendum)
Pt BP low (SBP in 60s) on first reading in PACU. Pt positioned in steep tburg, fluids opened and bp rechecked.  BP still in 60s SBP, so 25 mg total of ephedrine given BP in 120s before leaving PACu.  Traci Rn aware  BP was decreased for approx 2 minutes  Pt was really complaining of belly pain and follow instructions to pull knees up and bear down during this (air was being passed)

## 2014-09-18 NOTE — ED Provider Notes (Signed)
CSN: [id number]     Arrival date & time 09/18/14  1315 History   First MD Initiated Contact with Patient 09/18/14 1326     Chief Complaint  Patient presents with  . Abdominal Pain     (Consider location/radiation/quality/duration/timing/severity/associated sxs/prior Treatment) HPI   81yF with abdominal pain. Sent by GI. Abdominal pain after EGD/colonoscopy with polypectomy earlier today. Observed after procedure and pain did not improve. Sent to ED for further evaluation. Pain is across lower abdomen. Does not lateralize. Constant. Worse with movement. No n/v.  Past Medical History  Diagnosis Date  . Obesity   . Lumbosacral disc disease     Chronic pain; History of osteomyelitis 2010 following ESI complication  . ALLERGIC RHINITIS   . 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 ANXIETY   . ASTHMA   . Bariatric surgery status   . COLONIC POLYPS, ADENOMATOUS, HX OF   . DIVERTICULOSIS, COLON   . INSOMNIA   . URINARY URGENCY   . VITAMIN B12 DEFICIENCY   . Hypotension     pt denies   Past Surgical History  Procedure Laterality Date  . Cholecystectomy    . Tonsillectomy    . Ileojejunal bypass  1976    for obesity  . Back surgery  1994    for ruptured disc   Family History  Problem Relation Age of Onset  . Cervical cancer Mother   . Liver disease Sister   . Kidney disease Sister   . Cirrhosis Sister   . Colon cancer Neg Hx   . Esophageal cancer Neg Hx   . Rectal cancer Neg Hx   . Stomach cancer Neg Hx    History  Substance Use Topics  . Smoking status: Former Research scientist (life sciences)  . Smokeless tobacco: Never Used  . Alcohol Use: No   OB History    No data available     Review of Systems  All systems reviewed and negative, other than as noted in HPI.   Allergies  Azithromycin; Ciprofloxacin hcl; Codeine phosphate; Hydrocodone; Hydrocodone-homatropine; Levaquin; Meperidine hcl; Sulfa antibiotics; and Ultram  Home Medications    Prior to Admission medications   Medication Sig Start Date End Date Taking? Authorizing Provider  acetaminophen-codeine (TYLENOL #2) 300-15 MG per tablet TAKE 1 TABLET EVERY 4 HOURS AS NEEDED FOR PAIN 09/28/12   Jearld Fenton, NP  ALPRAZolam Duanne Moron) 1 MG tablet Take 1 tablet (1 mg total) by mouth at bedtime as needed. 08/12/14   Rowe Clack, MD  amitriptyline (ELAVIL) 100 MG tablet TAKE 1 TABLET AT BEDTIME 08/28/14   Rowe Clack, MD  azelastine (OPTIVAR) 0.05 % ophthalmic solution Place 1 drop into both eyes 2 (two) times daily.      Historical Provider, MD  budesonide-formoterol (SYMBICORT) 80-4.5 MCG/ACT inhaler Inhale 2 puffs into the lungs 2 (two) times daily. 02/10/13   Janith Lima, MD  cetirizine (ZYRTEC) 10 MG tablet Take 10 mg by mouth daily.  02/05/14   Historical Provider, MD  cholecalciferol (VITAMIN D) 1000 UNITS tablet Take 1,000 Units by mouth daily.    Historical Provider, MD  colestipol (COLESTID) 5 G granules Take 5 g by mouth daily. 09/18/14   Lafayette Dragon, MD  cyanocobalamin (,VITAMIN B-12,) 1000 MCG/ML injection Inject 1 mL (1,000 mcg total) into the muscle every 30 (thirty) days. 04/02/14   Lafayette Dragon, MD  dicyclomine (BENTYL) 10 MG capsule Take 1 capsule (10 mg  total) by mouth 3 (three) times daily. 05/12/14   Lafayette Dragon, MD  diphenoxylate-atropine (LOMOTIL) 2.5-0.025 MG per tablet Take 2 tablets by mouth 2 (two) times daily before a meal. 09/18/14   Lafayette Dragon, MD  Fe Fum-FA-B Cmp-C-Zn-Mg-Mn-Cu (HEMOCYTE PLUS) 106-1 MG CAPS TAKE ONE TABLET BY MOUTH TWICE A DAY 02/10/14   Lafayette Dragon, MD  fluticasone (FLONASE) 50 MCG/ACT nasal spray 2 sprays by Nasal route daily.      Historical Provider, MD  gabapentin (NEURONTIN) 300 MG capsule Take 300 mg by mouth daily.    Historical Provider, MD  hydrOXYzine (ATARAX) 25 MG tablet Take 25 mg by mouth 3 (three) times daily as needed.      Historical Provider, MD  levocetirizine (XYZAL) 5 MG tablet Take 1 tablet (5 mg  total) by mouth every evening. 08/04/14   Rowe Clack, MD  magnesium oxide (MAG-OX 400) 400 MG tablet Take 400 mg by mouth 2 (two) times daily.     Historical Provider, MD  methocarbamol (ROBAXIN) 500 MG tablet TAKE 1 TABLET (500 MG TOTAL) BY MOUTH 3 (THREE) TIMES DAILY AS NEEDED (MUSCLE SPASM). 07/21/13   Rowe Clack, MD  montelukast (SINGULAIR) 10 MG tablet Take 10 mg by mouth daily.  02/11/14   Historical Provider, MD  nabumetone (RELAFEN) 750 MG tablet Take 1 tablet (750 mg total) by mouth 2 (two) times daily. Patient not taking: Reported on 09/04/2014 11/06/12   Rowe Clack, MD  nystatin (MYCOSTATIN) 100000 UNIT/ML suspension TAKE 5 MLS (500,000 UNITS TOTAL) BY MOUTH 4 (FOUR) TIMES DAILY. 04/21/14   Rowe Clack, MD  omeprazole (PRILOSEC) 20 MG capsule Take 1 capsule (20 mg total) by mouth daily. 05/12/14   Lafayette Dragon, MD  phenazopyridine (PYRIDIUM) 200 MG tablet Take 1 tablet (200 mg total) by mouth 3 (three) times daily as needed for pain. 03/11/14   Rowe Clack, MD  potassium chloride SA (K-DUR,KLOR-CON) 20 MEQ tablet Take 1 tablet (20 mEq total) by mouth daily. 08/11/14   Rowe Clack, MD  PX ENTERIC ASPIRIN 81 MG EC tablet Take 81 mg by mouth daily.  02/05/14   Historical Provider, MD  valACYclovir (VALTREX) 500 MG tablet Take 500 mg by mouth as needed.      Historical Provider, MD  vitamin E 400 UNIT capsule Take 400 Units by mouth daily.      Historical Provider, MD  zafirlukast (ACCOLATE) 20 MG tablet Take 1 by mouth twice a day 11/13/11   Historical Provider, MD  zolpidem (AMBIEN) 5 MG tablet Take 1 tablet (5 mg total) by mouth at bedtime. 08/14/14   Rowe Clack, MD   BP 121/73 mmHg  Pulse 90  Temp(Src) 97.5 F (36.4 C) (Oral)  Resp 16  Ht 5\' 5"  (1.651 m)  Wt 190 lb (86.183 kg)  BMI 31.62 kg/m2  SpO2 97% Physical Exam  Constitutional: She appears well-developed and well-nourished.  Laying in bed. Appears uncomfortable.   HENT:  Head:  Normocephalic and atraumatic.  Eyes: Conjunctivae are normal. Right eye exhibits no discharge. Left eye exhibits no discharge.  Neck: Neck supple.  Cardiovascular: Normal rate, regular rhythm and normal heart sounds.  Exam reveals no gallop and no friction rub.   No murmur heard. Pulmonary/Chest: Effort normal and breath sounds normal. No respiratory distress.  Abdominal: Soft. There is tenderness.  Mild distension. Soft, but diffusely tender. Worse across lower abdomen.   Musculoskeletal: She exhibits no edema or tenderness.  Neurological: She is alert.  Skin: Skin is warm and dry.  Psychiatric: She has a normal mood and affect. Her behavior is normal. Thought content normal.  Nursing note and vitals reviewed.   ED Course  Procedures (including critical care time)  CRITICAL CARE Performed by: Virgel Manifold  Total critical care time: 35 minutes Critical care time was exclusive of separately billable procedures and treating other patients. Critical care was necessary to treat or prevent imminent or life-threatening deterioration. Critical care was time spent personally by me on the following activities: development of treatment plan with patient and/or surrogate as well as nursing, discussions with consultants, evaluation of patient's response to treatment, examination of patient, obtaining history from patient or surrogate, ordering and performing treatments and interventions, ordering and review of laboratory studies, ordering and review of radiographic studies, pulse oximetry and re-evaluation of patient's condition.  Labs Review Labs Reviewed  CBC WITH DIFFERENTIAL/PLATELET - Abnormal; Notable for the following:    RBC 3.79 (*)    All other components within normal limits  BASIC METABOLIC PANEL - Abnormal; Notable for the following:    Potassium 3.0 (*)    Glucose, Bld 103 (*)    Calcium 8.3 (*)    All other components within normal limits    Imaging Review Dg Abd Acute  W/chest  09/18/2014   CLINICAL DATA:  Status post endoscopy with polypectomy and abdominal pain  EXAM: DG ABDOMEN ACUTE W/ 1V CHEST  COMPARISON:  None.  FINDINGS: There is a considerable amount of free air within the abdomen noted most prominently on the decubitus view. The bowel gas pattern is otherwise within normal limits. Changes of prior cholecystectomy are seen. The chest shows no focal infiltrate but air is again noted beneath the right hemidiaphragm.  IMPRESSION: Significant pneumoperitoneum likely related to the recent polypectomy.  Critical Value/emergent results were called by telephone at the time of interpretation on 09/18/2014 at 2:32 pm to Dr. Virgel Manifold , who verbally acknowledged these results.   Electronically Signed   By: Inez Catalina M.D.   On: 09/18/2014 14:33     EKG Interpretation None      MDM   Final diagnoses:  Pneumoperitoneum  Peritonitis   81yF with abdominal pain after EGD/colonoscopy with polypectomy. Concern for possible perforation. Plain films to evaluate for free air. IV access. Pain meds. Basic labs. NPO. Plan CT if x-ray inconclusive.   Films unfortunately show free air. Abx ordered. Will discuss with surgery  Dr. Marlou Starks currently in [place]. Discussed case. Someone from surgical team to be over shortly. Patient/family updated.   Hypotension. IVF boluses and reassess.   Virgel Manifold, MD 09/18/14 [id number]

## 2014-09-18 NOTE — Anesthesia Postprocedure Evaluation (Signed)
  Anesthesia Post-op Note  Patient: Alexis Olson  Procedure(s) Performed: Procedure(s): EXPLORATORY LAPAROTOMY WITH REPAIR OF CECIAL PERFORATION (N/A)  Patient Location: PACU  Anesthesia Type:General  Level of Consciousness: awake and alert   Airway and Oxygen Therapy: Patient Spontanous Breathing  Post-op Pain: mild  Post-op Assessment: Post-op Vital signs reviewed, Patient's Cardiovascular Status Stable and Respiratory Function Stable  Post-op Vital Signs: Reviewed  Filed Vitals:   09/18/14 2000  BP: 111/50  Pulse: 101  Temp:   Resp: 10    Complications: No apparent anesthesia complications

## 2014-09-18 NOTE — OR Nursing (Signed)
Pt transported to CT via bed, monitor and oxygen.

## 2014-09-18 NOTE — ED Notes (Signed)
General surgery at bedside. 

## 2014-09-18 NOTE — Patient Instructions (Signed)
Impressions/recommendations:  Fundic gland polyps Hiatal hernia (handout given)  Polyp (handout given) Diverticulosis (handout given)  Await pathology results. No aspirin or antiinflammatory agents for 2 weeks, may resume July 10th. Continue iron supplements. Recheck iron studies and blood count every 3 months. Repeat colonoscopy pending pathology results. Add Colestipol 1 mg, 2 by mouth every day. Refill Lomotil 2 by mouth twice daily.  YOU HAD AN ENDOSCOPIC PROCEDURE TODAY AT [facility]:   Refer to the procedure report that was given to you for any specific questions about what was found during the examination.  If the procedure report does not answer your questions, please call your gastroenterologist to clarify.  If you requested that your care partner not be given the details of your procedure findings, then the procedure report has been included in a sealed envelope for you to review at your convenience later.  YOU SHOULD EXPECT: Some feelings of bloating in the abdomen. Passage of more gas than usual.  Walking can help get rid of the air that was put into your GI tract during the procedure and reduce the bloating. If you had a lower endoscopy (such as a colonoscopy or flexible sigmoidoscopy) you may notice spotting of blood in your stool or on the toilet paper. If you underwent a bowel prep for your procedure, you may not have a normal bowel movement for a few days.  Please Note:  You might notice some irritation and congestion in your nose or some drainage.  This is from the oxygen used during your procedure.  There is no need for concern and it should clear up in a day or so.  SYMPTOMS TO REPORT IMMEDIATELY:   Following lower endoscopy (colonoscopy or flexible sigmoidoscopy):  Excessive amounts of blood in the stool  Significant tenderness or worsening of abdominal pains  Swelling of the abdomen that is new, acute  Fever of 100F or higher   Following upper  endoscopy (EGD)  Vomiting of blood or coffee ground material  New chest pain or pain under the shoulder blades  Painful or persistently difficult swallowing  New shortness of breath  Fever of 100F or higher  Black, tarry-looking stools  For urgent or emergent issues, a gastroenterologist can be reached at any hour by calling [phone].   DIET: Your first meal following the procedure should be a small meal and then it is ok to progress to your normal diet. Heavy or fried foods are harder to digest and may make you feel nauseous or bloated.  Likewise, meals heavy in dairy and vegetables can increase bloating.  Drink plenty of fluids but you should avoid alcoholic beverages for 24 hours.  ACTIVITY:  You should plan to take it easy for the rest of today and you should NOT DRIVE or use heavy machinery until tomorrow (because of the sedation medicines used during the test).    FOLLOW UP: Our staff will call the number listed on your records the next business day following your procedure to check on you and address any questions or concerns that you may have regarding the information given to you following your procedure. If we do not reach you, we will leave a message.  However, if you are feeling well and you are not experiencing any problems, there is no need to return our call.  We will assume that you have returned to your regular daily activities without incident.  If any biopsies were taken you will be contacted by phone or  by letter within the next 1-3 weeks.  Please call us at [phone] if you have not heard about the biopsies in 3 weeks.    SIGNATURES/CONFIDENTIALITY: You and/or your care partner have signed paperwork which will be entered into your electronic medical record.  These signatures attest to the fact that that the information above on your After Visit Summary has been reviewed and is understood.  Full responsibility of the confidentiality of this discharge information  lies with you and/or your care-partner.YOU HAD AN ENDOSCOPIC PROCEDURE TODAY AT [facility]:   Refer to the procedure report that was given to you for any specific questions about what was found during the examination.  If the procedure report does not answer your questions, please call your gastroenterologist to clarify.  If you requested that your care partner not be given the details of your procedure findings, then the procedure report has been included in a sealed envelope for you to review at your convenience later.  YOU SHOULD EXPECT: Some feelings of bloating in the abdomen. Passage of more gas than usual.  Walking can help get rid of the air that was put into your GI tract during the procedure and reduce the bloating. If you had a lower endoscopy (such as a colonoscopy or flexible sigmoidoscopy) you may notice spotting of blood in your stool or on the toilet paper. If you underwent a bowel prep for your procedure, you may not have a normal bowel movement for a few days.  Please Note:  You might notice some irritation and congestion in your nose or some drainage.  This is from the oxygen used during your procedure.  There is no need for concern and it should clear up in a day or so.  SYMPTOMS TO REPORT IMMEDIATELY:   Following lower endoscopy (colonoscopy or flexible sigmoidoscopy):  Excessive amounts of blood in the stool  Significant tenderness or worsening of abdominal pains  Swelling of the abdomen that is new, acute  Fever of 100F or higher   Following upper endoscopy (EGD)  Vomiting of blood or coffee ground material  New chest pain or pain under the shoulder blades  Painful or persistently difficult swallowing  New shortness of breath  Fever of 100F or higher  Black, tarry-looking stools  For urgent or emergent issues, a gastroenterologist can be reached at any hour by calling [phone].   DIET: Your first meal following the procedure should be a  small meal and then it is ok to progress to your normal diet. Heavy or fried foods are harder to digest and may make you feel nauseous or bloated.  Likewise, meals heavy in dairy and vegetables can increase bloating.  Drink plenty of fluids but you should avoid alcoholic beverages for 24 hours.  ACTIVITY:  You should plan to take it easy for the rest of today and you should NOT DRIVE or use heavy machinery until tomorrow (because of the sedation medicines used during the test).    FOLLOW UP: Our staff will call the number listed on your records the next business day following your procedure to check on you and address any questions or concerns that you may have regarding the information given to you following your procedure. If we do not reach you, we will leave a message.  However, if you are feeling well and you are not experiencing any problems, there is no need to return our call.  We will assume that you have returned to  your regular daily activities without incident.  If any biopsies were taken you will be contacted by phone or by letter within the next 1-3 weeks.  Please call us at [phone] if you have not heard about the biopsies in 3 weeks.    SIGNATURES/CONFIDENTIALITY: You and/or your care partner have signed paperwork which will be entered into your electronic medical record.  These signatures attest to the fact that that the information above on your After Visit Summary has been reviewed and is understood.  Full responsibility of the confidentiality of this discharge information lies with you and/or your care-partner.

## 2014-09-18 NOTE — ED Notes (Signed)
MD at bedside. [email or web address]

## 2014-09-18 NOTE — ED Notes (Signed)
MD at bedside. 

## 2014-09-18 NOTE — Op Note (Addendum)
[place]  [address] [place], [id number]   COLONOSCOPY PROCEDURE REPORT  PATIENT: Alexis Olson, Alexis Olson  MR#: [id number] BIRTHDATE: July 18, 1933 , 81  yrs. old GENDER: female ENDOSCOPIST: Lafayette Dragon, MD REFERRED [id number] Asa Lente, M.D. PROCEDURE DATE:  09/18/2014 PROCEDURE:   Colonoscopy, surveillance , Colonoscopy with snare polypectomy, and Colonoscopy with biopsy First Screening Colonoscopy - Avg.  risk and is 50 yrs.  old or older - No.  Prior Negative Screening - Now for repeat screening. N/A  History of Adenoma - Now for follow-up colonoscopy & has been > or = to 3 yrs.  Yes hx of adenoma.  Has been 3 or more years since last colonoscopy.  Polyps removed today? Yes ASA CLASS:   Class III INDICATIONS:Surveillance due to prior colonic neoplasia, Colorectal Neoplasm Risk Assessment for this procedure is average risk, and status post jejunoileal bypass in 1960.  Chronic diarrhea.  Iron deficiency.  Heme positive stool.  Prior colonoscopy in 2006 tubovillous adenoma in 2010.  Positive IBD markers, chronic elevation off CEA. MEDICATIONS: Monitored anesthesia care and Propofol 250 mg IV  DESCRIPTION OF PROCEDURE:   After the risks benefits and alternatives of the procedure were thoroughly explained, informed consent was obtained.  The digital rectal exam revealed no abnormalities of the rectum.   The LB [id number]-H190 [id number]  endoscope was introduced through the anus and advanced to the cecum, which was identified by both the appendix and ileocecal valve. No adverse events experienced.   The quality of the prep was good.  (MoviPrep was used)  The instrument was then slowly withdrawn as the colon was fully examined. Estimated blood loss is zero unless otherwise noted in this procedure report.      COLON FINDINGS: A polypoid shaped sessile polyp measuring 15 mm in size was found at the cecum.  A polypectomy was performed using snare cautery.  The resection was  complete, the polyp tissue was completely retrieved and sent to histology.  Multiple biopsies were performed using cold forceps.  Sample was obtained and sent to histology.   There was mild diverticulosis noted in the descending colon.  Retroflexed views revealed no abnormalities. The time to cecum = 8.31 Withdrawal time = 13.34   The scope was withdrawn and the procedure completed. COMPLICATIONS: There were no immediate complications.  ENDOSCOPIC IMPRESSION: 1.   Sessile polyp was found at the cecum; polypectomy was performed piacemeal using snare cautery; 2.  multiple biopsies were performed using cold forceps to r/o microscpic colitis 3.   Mild diverticulosis was noted in the descending colon  RECOMMENDATIONS: 1.  Await pathology results 2.  No aspirin or and inflammatory agents for 2 weeks Continue iron supplements Recheck iron studies and blood count every 3 months Call colonoscopy pending path report Add Colestipol 1 mg, 2 po qd, refill Lommotil 2 po bid, #360, 3 refills,  Addendum: pt experienced increased abdominal pain following colonoscopy, 5/10 on pain scale, passed only small amount of flatus. Will send [facility], r/o pneumoperitoneum, BP 120/80, P=81, O2 sat 81%  eSigned:  Lafayette Dragon, MD 09/18/2014 12:31 PM Revised: 09/18/2014 12:31 PM  cc:   PATIENT NAME:  Alexis Olson, Alexis Olson MR#: [id number]

## 2014-09-19 LAB — BASIC METABOLIC PANEL
Anion gap: 6 (ref 5–15)
BUN: <5 mg/dL — ABNORMAL LOW (ref 6–20)
CO2: 23 mmol/L (ref 22–32)
Calcium: 7.6 mg/dL — ABNORMAL LOW (ref 8.9–10.3)
Chloride: 109 mmol/L (ref 101–111)
Creatinine, Ser: 0.56 mg/dL (ref 0.44–1.00)
GFR calc Af Amer: >60 mL/min (ref >60)
GFR calc non Af Amer: >60 mL/min (ref >60)
Glucose, Bld: 119 mg/dL — ABNORMAL HIGH (ref 65–99)
Potassium: 3.4 mmol/L — ABNORMAL LOW (ref 3.5–5.1)
Sodium: 138 mmol/L (ref 135–145)

## 2014-09-19 LAB — CBC
HCT: 35.8 % — ABNORMAL LOW (ref 36.0–46.0)
Hemoglobin: 11.5 g/dL — ABNORMAL LOW (ref 12.0–15.0)
MCH: 31.7 pg (ref 26.0–34.0)
MCHC: 32.1 g/dL (ref 30.0–36.0)
MCV: 98.6 fL (ref 78.0–100.0)
Platelets: 207 10*3/uL (ref 150–400)
RBC: 3.63 MIL/uL — ABNORMAL LOW (ref 3.87–5.11)
RDW: 13.6 % (ref 11.5–15.5)
WBC: 14.7 10*3/uL — ABNORMAL HIGH (ref 4.0–10.5)

## 2014-09-19 LAB — MRSA PCR SCREENING: MRSA by PCR: NEGATIVE

## 2014-09-19 MED ORDER — WHITE PETROLATUM GEL
Status: AC
  Administered 2014-09-19: 02:00:00
  Filled 2014-09-19: qty 1

## 2014-09-19 MED ORDER — ALBUTEROL SULFATE (2.5 MG/3ML) 0.083% IN NEBU
2.5000 mg | INHALATION_SOLUTION | RESPIRATORY_TRACT | Status: DC | PRN
  Administered 2014-09-20 – 2014-09-21 (×4): 2.5 mg via RESPIRATORY_TRACT
  Filled 2014-09-19 (×4): qty 3

## 2014-09-19 MED ORDER — LORAZEPAM 2 MG/ML IJ SOLN
0.5000 mg | Freq: Three times a day (TID) | INTRAMUSCULAR | Status: DC | PRN
  Administered 2014-09-19 – 2014-09-20 (×3): 1 mg via INTRAVENOUS
  Filled 2014-09-19 (×4): qty 1

## 2014-09-19 NOTE — Progress Notes (Signed)
UR COMPLETED  

## 2014-09-19 NOTE — Progress Notes (Signed)
Checked on the pt, post repair of a cecal perforation. She is alert and reasonably comfortable. Spoke with Dr Marlou Starks, ,NG tube to stay till  bowl sounds return.

## 2014-09-19 NOTE — Op Note (Signed)
09/18/2014  9:28 AM  PATIENT:  Alexis Olson  81 y.o. female  PRE-OPERATIVE DIAGNOSIS:  pneumoperitoneum  POST-OPERATIVE DIAGNOSIS:  Pneumoperitoneum, perforation of cecum  PROCEDURE:  Procedure(s): EXPLORATORY LAPAROTOMY WITH REPAIR OF CECIAL PERFORATION (N/A)  SURGEON:  Surgeon(s) and Role:    * Jovita Kussmaul, MD - Primary    * Erroll Luna, MD - Assisting  PHYSICIAN ASSISTANT:   ASSISTANTS: Dr. Brantley Stage   ANESTHESIA:   general  EBL:  Total I/O In: -  Out: 320 [Urine:300; Emesis/NG output:20]  BLOOD ADMINISTERED:none  DRAINS: none   LOCAL MEDICATIONS USED:  NONE  SPECIMEN:  Source of Specimen:  perforation of cecum at area of resected polyp  DISPOSITION OF SPECIMEN:  PATHOLOGY  COUNTS:  YES  TOURNIQUET:  * No tourniquets in log *  DICTATION: .Dragon Dictation  After informed consent was obtained the patient was brought to the operating room and placed in the supine position on the operating room table. After adequate induction of general anesthesia the patient's abdomen was prepped with ChloraPrep, allowed to dry, and draped in usual sterile manner. A midline incision was made with a 10 blade knife. This incision was carried through the skin and subcutaneous tissue sharply with electrocautery until the linea alba was identified. The linea alba was also incised with the electrocautery. The peritoneum was identified and grasped with hemostats. The peritoneum was opened sharply with Metzenbaum scissors until access was gained to the abdominal cavity. The rest of the incision was opened under direct vision. There was a moderate amount of free air and fluid in the abdomen. This was evacuated. The abdomen was generally inspected. There was some omental adhesion to the upper abdominal wall. The only other abnormality noted was a hole that was seen in the anterior wall of the cecum. The tissue around the perforation appeared healthy. The perforation was grasped with a Babcock and  elevated. The perforated area was resected with a single firing of a GIA-75 stapler. The tissue was sent to pathology for further evaluation. The staple line was imbricated with 2-0 silk Lembert stitches. The abdomen was then irrigated with copious amounts of saline until the effluent was clear. Next the fascia of the anterior abdominal wall was closed with 2 running #1 double-stranded loop PDS sutures. The subcutaneous tissue was packed with moistened Kerlix gauze. Sterile dressings were applied. The patient tolerated the procedure well. At the end of the case all needle sponge and instrument counts were correct. The patient was then awakened and taken to recovery in stable condition.  PLAN OF CARE: Admit to inpatient   PATIENT DISPOSITION:  PACU - hemodynamically stable.   Delay start of Pharmacological VTE agent (>24hrs) due to surgical blood loss or risk of bleeding: no

## 2014-09-19 NOTE — Progress Notes (Signed)
1 Day Post-Op  Subjective: Complains about tube in nose. Belly feels a little better than yesterday  Objective: Vital signs in last 24 hours: Temp:  [96.9 F (36.1 C)-98.7 F (37.1 C)] 98.7 F (37.1 C) (06/25 0337) Pulse Rate:  [64-108] 105 (06/25 0700) Resp:  [9-24] 10 (06/25 0700) BP: (65-141)/(35-84) 110/62 mmHg (06/25 0700) SpO2:  [72 %-100 %] 95 % (06/25 0700) Weight:  [86.183 kg (190 lb)-95.3 kg (210 lb 1.6 oz)] 95.3 kg (210 lb 1.6 oz) (06/24 2147)    Intake/Output from previous day: 06/24 0701 - 06/25 0700 In: 1804.2 [I.V.:1754.2; IV Piggyback:50] Out: 2720 [Urine:2300; Emesis/NG output:370; Blood:50] Intake/Output this shift:    Resp: clear to auscultation bilaterally Cardio: regular rate and rhythm GI: soft, appropriate tenderness. quiet  Lab Results:   Recent Labs  09/18/14 1359 09/19/14 0714  WBC 5.9 14.7*  HGB 12.1 11.5*  HCT 36.8 35.8*  PLT 214 207   BMET  Recent Labs  09/18/14 1359 09/19/14 0714  NA 138 138  K 3.0* 3.4*  CL 105 109  CO2 24 23  GLUCOSE 103* 119*  BUN 6 <5*  CREATININE 0.55 0.56  CALCIUM 8.3* 7.6*   PT/INR No results for input(s): LABPROT, INR in the last 72 hours. ABG No results for input(s): PHART, HCO3 in the last 72 hours.  Invalid input(s): PCO2, PO2  Studies/Results: Ct Chest W Contrast  09/18/2014   CLINICAL DATA:  Colonoscopy today with perforation with subsequent surgery. Evaluate mediastinum.  EXAM: CT CHEST WITH CONTRAST  TECHNIQUE: Multidetector CT imaging of the chest was performed during intravenous contrast administration.  CONTRAST:  44mL OMNIPAQUE IOHEXOL 300 MG/ML  SOLN  COMPARISON:  Chest x-ray 09/18/2014, and 08/10/2013  FINDINGS: Enteric tube is present with tip in the gastric body. Lungs are adequately inflated and demonstrate a tiny amount of bilateral pleural fluid with bibasilar atelectasis. Airways are within normal.  Heart is normal size. Mild calcified plaque over the left anterior descending  coronary artery. Remaining mediastinal structures are within normal.  Images through the upper abdomen demonstrate free peritoneal air compatible patient's recent colonic perforation and surgery. Small amount of free fluid is present. There is a stable 1.5 cm hypodensity over the right lobe of the liver likely minimally complicated cyst. Mild diverticulosis of the colon. Remainder the exam is unchanged.  IMPRESSION: Tiny amount of bilateral pleural fluid with posterior bibasilar atelectatic change. No significant mediastinal abnormality.  Free peritoneal air compatible patient's recent colonic perforation and surgery.  Enteric tube with tip over the gastric body.  Small amount of free fluid in the abdomen.  Stable 1.5 cm minimally complicated cyst over the right lobe of the liver.  Colonic diverticulosis.   Electronically Signed   By: Marin Olp M.D.   On: 09/18/2014 22:07   Dg Chest Port 1 View  09/18/2014   CLINICAL DATA:  Central line insertion.  Acute bowel perforation.  EXAM: PORTABLE CHEST - 1 VIEW 6:40 p.m.  COMPARISON:  Chest x-rays dated 09/18/2014 at 2:12 p.m. and 12/11/2013  FINDINGS: Left subclavian line has been inserted. The tip overlies the superior vena cava at the junction of the innominate vein. No pneumothorax. NG tube tip in the body of the stomach. Superior mediastinum appears more prominent than on the prior study. The possibility of mediastinal hemorrhage should be considered.  Slight bilateral atelectasis. Heart size and pulmonary vascularity are normal.  IMPRESSION: No active disease. Central line tip overlies the superior vena cava at the level of the  left innominate vein. Superior mediastinum appears more prominent than on the preoperative exam. Critical Value/emergent results were called by telephone at the time of interpretation on 09/18/2014 at 7:02 pm to Irma Newness, RN , who verbally acknowledged these results.   Electronically Signed   By: Lorriane Shire M.D.   On: 09/18/2014  19:03   Dg Abd Acute W/chest  09/18/2014   CLINICAL DATA:  Status post endoscopy with polypectomy and abdominal pain  EXAM: DG ABDOMEN ACUTE W/ 1V CHEST  COMPARISON:  None.  FINDINGS: There is a considerable amount of free air within the abdomen noted most prominently on the decubitus view. The bowel gas pattern is otherwise within normal limits. Changes of prior cholecystectomy are seen. The chest shows no focal infiltrate but air is again noted beneath the right hemidiaphragm.  IMPRESSION: Significant pneumoperitoneum likely related to the recent polypectomy.  Critical Value/emergent results were called by telephone at the time of interpretation on 09/18/2014 at 2:32 pm to Dr. Virgel Manifold , who verbally acknowledged these results.   Electronically Signed   By: Inez Catalina M.D.   On: 09/18/2014 14:33    Anti-infectives: Anti-infectives    Start     Dose/Rate Route Frequency Ordered Stop   09/18/14 2300  cefOXitin (MEFOXIN) 2 g in dextrose 5 % 50 mL IVPB     2 g 100 mL/hr over 30 Minutes Intravenous 3 times per day 09/18/14 1540     09/18/14 1445  cefTRIAXone (ROCEPHIN) 1 g in dextrose 5 % 50 mL IVPB     1 g 100 mL/hr over 30 Minutes Intravenous  Once 09/18/14 1435 09/18/14 1514   09/18/14 1445  metroNIDAZOLE (FLAGYL) IVPB 500 mg     500 mg 100 mL/hr over 60 Minutes Intravenous  Once 09/18/14 1435 09/18/14 1617      Assessment/Plan: s/p Procedure(s): EXPLORATORY LAPAROTOMY WITH REPAIR OF CECIAL PERFORATION (N/A) continue ng and bowel rest until bowel function returns  Cefoxitin day 2 Start dressing changes today  LOS: 1 day    TOTH III,PAUL S 09/19/2014

## 2014-09-20 ENCOUNTER — Inpatient Hospital Stay (HOSPITAL_COMMUNITY): Payer: Commercial Managed Care - HMO

## 2014-09-20 LAB — BLOOD GAS, ARTERIAL
Acid-Base Excess: 3.7 mmol/L — ABNORMAL HIGH (ref 0.0–2.0)
Bicarbonate: 27 mEq/L — ABNORMAL HIGH (ref 20.0–24.0)
Drawn by: 28340
O2 Content: 2 L/min
O2 Saturation: 91.8 %
Patient temperature: 98.6
TCO2: 28.1 mmol/L (ref 0–100)
pCO2 arterial: 36.4 mmHg (ref 35.0–45.0)
pH, Arterial: 7.484 — ABNORMAL HIGH (ref 7.350–7.450)
pO2, Arterial: 63.2 mmHg — ABNORMAL LOW (ref 80.0–100.0)

## 2014-09-20 LAB — CBC
HCT: 33.7 % — ABNORMAL LOW (ref 36.0–46.0)
Hemoglobin: 10.8 g/dL — ABNORMAL LOW (ref 12.0–15.0)
MCH: 31.3 pg (ref 26.0–34.0)
MCHC: 32 g/dL (ref 30.0–36.0)
MCV: 97.7 fL (ref 78.0–100.0)
Platelets: 194 10*3/uL (ref 150–400)
RBC: 3.45 MIL/uL — ABNORMAL LOW (ref 3.87–5.11)
RDW: 13.7 % (ref 11.5–15.5)
WBC: 13.4 10*3/uL — ABNORMAL HIGH (ref 4.0–10.5)

## 2014-09-20 LAB — URINALYSIS, ROUTINE W REFLEX MICROSCOPIC
Bilirubin Urine: NEGATIVE
Glucose, UA: NEGATIVE mg/dL
Ketones, ur: NEGATIVE mg/dL
Leukocytes, UA: NEGATIVE
Nitrite: NEGATIVE
Protein, ur: NEGATIVE mg/dL
Specific Gravity, Urine: 1.014 (ref 1.005–1.030)
Urobilinogen, UA: 0.2 mg/dL (ref 0.0–1.0)
pH: 6.5 (ref 5.0–8.0)

## 2014-09-20 LAB — URINE MICROSCOPIC-ADD ON

## 2014-09-20 MED ORDER — SODIUM CHLORIDE 0.9 % IV SOLN
1.0000 g | INTRAVENOUS | Status: DC
  Administered 2014-09-21 – 2014-09-25 (×6): 1 g via INTRAVENOUS
  Filled 2014-09-20 (×8): qty 1

## 2014-09-20 MED ORDER — LORAZEPAM 2 MG/ML IJ SOLN
0.5000 mg | Freq: Four times a day (QID) | INTRAMUSCULAR | Status: DC | PRN
  Administered 2014-09-20: 0.5 mg via INTRAVENOUS
  Filled 2014-09-20: qty 1

## 2014-09-20 MED ORDER — PIPERACILLIN-TAZOBACTAM 3.375 G IVPB 30 MIN: 3.3750 g | Freq: Three times a day (TID) | INTRAVENOUS | Status: DC

## 2014-09-20 MED ORDER — LORAZEPAM 2 MG/ML IJ SOLN
0.5000 mg | Freq: Four times a day (QID) | INTRAMUSCULAR | Status: DC
  Administered 2014-09-20: 1 mg via INTRAVENOUS
  Administered 2014-09-20: 0.5 mg via INTRAVENOUS
  Filled 2014-09-20: qty 1

## 2014-09-20 NOTE — Progress Notes (Signed)
2 Days Post-Op  Subjective: PT ITCHING AND SLEPT POORLY NOT OOB YET   Objective: Vital signs in last 24 hours: Temp:  [98.2 F (36.8 C)-99 F (37.2 C)] 98.2 F (36.8 C) (06/26 0400) Pulse Rate:  [106-114] 114 (06/26 0400) Resp:  [13-23] 15 (06/26 0800) BP: (110-138)/(60-68) 136/68 mmHg (06/26 0400) SpO2:  [42 %-94 %] 93 % (06/26 0400) FiO2 (%):  [41 %] 41 % (06/25 0906)    Intake/Output from previous day: 06/25 0701 - 06/26 0700 In: -  Out: 2195 [Urine:1675; Emesis/NG output:520] Intake/Output this shift: Total I/O In: -  Out: 450 [Urine:450]  Incision/Wound:OPEN CLEAN  SOFT LESS TENDER ABDOMEN  Lab Results:   Recent Labs  09/18/14 1359 09/19/14 0714  WBC 5.9 14.7*  HGB 12.1 11.5*  HCT 36.8 35.8*  PLT 214 207   BMET  Recent Labs  09/18/14 1359 09/19/14 0714  NA 138 138  K 3.0* 3.4*  CL 105 109  CO2 24 23  GLUCOSE 103* 119*  BUN 6 <5*  CREATININE 0.55 0.56  CALCIUM 8.3* 7.6*   PT/INR No results for input(s): LABPROT, INR in the last 72 hours. ABG No results for input(s): PHART, HCO3 in the last 72 hours.  Invalid input(s): PCO2, PO2  Studies/Results: Ct Chest W Contrast  09/18/2014   CLINICAL DATA:  Colonoscopy today with perforation with subsequent surgery. Evaluate mediastinum.  EXAM: CT CHEST WITH CONTRAST  TECHNIQUE: Multidetector CT imaging of the chest was performed during intravenous contrast administration.  CONTRAST:  38mL OMNIPAQUE IOHEXOL 300 MG/ML  SOLN  COMPARISON:  Chest x-ray 09/18/2014, and 08/10/2013  FINDINGS: Enteric tube is present with tip in the gastric body. Lungs are adequately inflated and demonstrate a tiny amount of bilateral pleural fluid with bibasilar atelectasis. Airways are within normal.  Heart is normal size. Mild calcified plaque over the left anterior descending coronary artery. Remaining mediastinal structures are within normal.  Images through the upper abdomen demonstrate free peritoneal air compatible patient's  recent colonic perforation and surgery. Small amount of free fluid is present. There is a stable 1.5 cm hypodensity over the right lobe of the liver likely minimally complicated cyst. Mild diverticulosis of the colon. Remainder the exam is unchanged.  IMPRESSION: Tiny amount of bilateral pleural fluid with posterior bibasilar atelectatic change. No significant mediastinal abnormality.  Free peritoneal air compatible patient's recent colonic perforation and surgery.  Enteric tube with tip over the gastric body.  Small amount of free fluid in the abdomen.  Stable 1.5 cm minimally complicated cyst over the right lobe of the liver.  Colonic diverticulosis.   Electronically Signed   By: Marin Olp M.D.   On: 09/18/2014 22:07   Dg Chest Port 1 View  09/18/2014   CLINICAL DATA:  Central line insertion.  Acute bowel perforation.  EXAM: PORTABLE CHEST - 1 VIEW 6:40 p.m.  COMPARISON:  Chest x-rays dated 09/18/2014 at 2:12 p.m. and 12/11/2013  FINDINGS: Left subclavian line has been inserted. The tip overlies the superior vena cava at the junction of the innominate vein. No pneumothorax. NG tube tip in the body of the stomach. Superior mediastinum appears more prominent than on the prior study. The possibility of mediastinal hemorrhage should be considered.  Slight bilateral atelectasis. Heart size and pulmonary vascularity are normal.  IMPRESSION: No active disease. Central line tip overlies the superior vena cava at the level of the left innominate vein. Superior mediastinum appears more prominent than on the preoperative exam. Critical Value/emergent results were called  by telephone at the time of interpretation on 09/18/2014 at 7:02 pm to Irma Newness, RN , who verbally acknowledged these results.   Electronically Signed   By: Lorriane Shire M.D.   On: 09/18/2014 19:03   Dg Abd Acute W/chest  09/18/2014   CLINICAL DATA:  Status post endoscopy with polypectomy and abdominal pain  EXAM: DG ABDOMEN ACUTE W/ 1V CHEST   COMPARISON:  None.  FINDINGS: There is a considerable amount of free air within the abdomen noted most prominently on the decubitus view. The bowel gas pattern is otherwise within normal limits. Changes of prior cholecystectomy are seen. The chest shows no focal infiltrate but air is again noted beneath the right hemidiaphragm.  IMPRESSION: Significant pneumoperitoneum likely related to the recent polypectomy.  Critical Value/emergent results were called by telephone at the time of interpretation on 09/18/2014 at 2:32 pm to Dr. Virgel Manifold , who verbally acknowledged these results.   Electronically Signed   By: Inez Catalina M.D.   On: 09/18/2014 14:33    Anti-infectives: Anti-infectives    Start     Dose/Rate Route Frequency Ordered Stop   09/18/14 2300  cefOXitin (MEFOXIN) 2 g in dextrose 5 % 50 mL IVPB     2 g 100 mL/hr over 30 Minutes Intravenous 3 times per day 09/18/14 1540     09/18/14 1445  cefTRIAXone (ROCEPHIN) 1 g in dextrose 5 % 50 mL IVPB     1 g 100 mL/hr over 30 Minutes Intravenous  Once 09/18/14 1435 09/18/14 1514   09/18/14 1445  metroNIDAZOLE (FLAGYL) IVPB 500 mg     500 mg 100 mL/hr over 60 Minutes Intravenous  Once 09/18/14 1435 09/18/14 1617      Assessment/Plan: s/p Procedure(s): EXPLORATORY LAPAROTOMY WITH REPAIR OF CECIAL PERFORATION (N/A) Keep in [place] I/O CHECK LABS IN AM DVT PROTECTION NGT OUT Monday  CONTINUE ABX   LOS: 2 days    CORNETT,THOMAS A. 09/20/2014

## 2014-09-20 NOTE — Progress Notes (Signed)
Quiet bowl sounds heard this morning, overall doing well, did not sleep well due to NG tube- I have changed the Ativan .5 mg order to q 6 hrs.Spoke with husband and Dr Brantley Stage.

## 2014-09-20 NOTE — Progress Notes (Signed)
[facility] Progress Note Patient Name: Alexis Olson DOB: 05-Sep-1933 MRN: [id number]   Date of Service  09/20/2014  HPI/Events of Note  CCM consult received for sepsis - post op colonic perf, mild hypoxia, atelectasis  eICU Interventions  Chk lactate Ct cefoxitin  Incentive spirometry     Intervention Category Intermediate Interventions: Diagnostic test evaluation  ALVA,RAKESH V. 09/20/2014, 11:41 PM

## 2014-09-20 NOTE — Telephone Encounter (Signed)
On call note. 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 CCS managing pt and Dr. Olevia Perches has followed pt. Contacted 3S and awaiting call back from her nurse. CCM may need to evaluate patient.

## 2014-09-20 NOTE — Progress Notes (Signed)
This RN paged Dr. Brantley Stage about possible sepsis workup. MD responded and will consult CCM for further management. This RN also spoke with Dr. Fuller Plan and informed him of the consult. Will continue to monitor pt.

## 2014-09-20 NOTE — Progress Notes (Signed)
Spoke with Dr. Brantley Stage regarding pt being tachy 110-120's, afebrile, and WBC's increasing.  Orders received to obtain a portable chest xray, collect a urinalysis with culture and sensitivity, and order mefoxin.  Mefoxin has already been ordered.  Will continue to closely monitor.

## 2014-09-20 NOTE — Progress Notes (Signed)
On assessment auscultated wheezing. IS encouraged pt achieved 100 ml.  Pt. RR in the 20's. Respiratory notified and will give albulterol prn treatment. Will continue to monitor pt.

## 2014-09-21 ENCOUNTER — Telehealth: Payer: Self-pay | Admitting: *Deleted

## 2014-09-21 ENCOUNTER — Encounter (HOSPITAL_COMMUNITY): Payer: Self-pay | Admitting: General Surgery

## 2014-09-21 LAB — COMPREHENSIVE METABOLIC PANEL
ALT: 17 U/L (ref 14–54)
ALT: 17 U/L (ref 14–54)
AST: 25 U/L (ref 15–41)
AST: 25 U/L (ref 15–41)
Albumin: 2.6 g/dL — ABNORMAL LOW (ref 3.5–5.0)
Albumin: 2.6 g/dL — ABNORMAL LOW (ref 3.5–5.0)
Alkaline Phosphatase: 42 U/L (ref 38–126)
Alkaline Phosphatase: 42 U/L (ref 38–126)
Anion gap: 5 (ref 5–15)
Anion gap: 5 (ref 5–15)
BUN: <5 mg/dL — ABNORMAL LOW (ref 6–20)
BUN: <5 mg/dL — ABNORMAL LOW (ref 6–20)
CO2: 29 mmol/L (ref 22–32)
CO2: 29 mmol/L (ref 22–32)
Calcium: 8.3 mg/dL — ABNORMAL LOW (ref 8.9–10.3)
Calcium: 8.3 mg/dL — ABNORMAL LOW (ref 8.9–10.3)
Chloride: 105 mmol/L (ref 101–111)
Chloride: 105 mmol/L (ref 101–111)
Creatinine, Ser: 0.46 mg/dL (ref 0.44–1.00)
Creatinine, Ser: 0.46 mg/dL (ref 0.44–1.00)
GFR calc Af Amer: >60 mL/min (ref >60)
GFR calc Af Amer: >60 mL/min (ref >60)
GFR calc non Af Amer: >60 mL/min (ref >60)
GFR calc non Af Amer: >60 mL/min (ref >60)
Glucose, Bld: 128 mg/dL — ABNORMAL HIGH (ref 65–99)
Glucose, Bld: 128 mg/dL — ABNORMAL HIGH (ref 65–99)
Potassium: 2.9 mmol/L — ABNORMAL LOW (ref 3.5–5.1)
Potassium: 2.9 mmol/L — ABNORMAL LOW (ref 3.5–5.1)
Sodium: 139 mmol/L (ref 135–145)
Sodium: 139 mmol/L (ref 135–145)
Total Bilirubin: 0.3 mg/dL (ref 0.3–1.2)
Total Bilirubin: 0.3 mg/dL (ref 0.3–1.2)
Total Protein: 5.4 g/dL — ABNORMAL LOW (ref 6.5–8.1)
Total Protein: 5.4 g/dL — ABNORMAL LOW (ref 6.5–8.1)

## 2014-09-21 LAB — DIFFERENTIAL
Basophils Absolute: 0 10*3/uL (ref 0.0–0.1)
Basophils Relative: 0 % (ref 0–1)
Eosinophils Absolute: 0.2 10*3/uL (ref 0.0–0.7)
Eosinophils Relative: 1 % (ref 0–5)
Lymphocytes Relative: 13 % (ref 12–46)
Lymphs Abs: 1.6 10*3/uL (ref 0.7–4.0)
Monocytes Absolute: 0.7 10*3/uL (ref 0.1–1.0)
Monocytes Relative: 6 % (ref 3–12)
Neutro Abs: 9.7 10*3/uL — ABNORMAL HIGH (ref 1.7–7.7)
Neutrophils Relative %: 80 % — ABNORMAL HIGH (ref 43–77)

## 2014-09-21 LAB — CBC
HCT: 33.3 % — ABNORMAL LOW (ref 36.0–46.0)
Hemoglobin: 10.8 g/dL — ABNORMAL LOW (ref 12.0–15.0)
MCH: 32.1 pg (ref 26.0–34.0)
MCHC: 32.4 g/dL (ref 30.0–36.0)
MCV: 99.1 fL (ref 78.0–100.0)
Platelets: 193 10*3/uL (ref 150–400)
RBC: 3.36 MIL/uL — ABNORMAL LOW (ref 3.87–5.11)
RDW: 13.7 % (ref 11.5–15.5)
WBC: 12.6 10*3/uL — ABNORMAL HIGH (ref 4.0–10.5)

## 2014-09-21 LAB — LACTIC ACID, PLASMA: Lactic Acid, Venous: 1.7 mmol/L (ref 0.5–2.0)

## 2014-09-21 LAB — TROPONIN I: Troponin I: 0.12 ng/mL — ABNORMAL HIGH (ref <0.031)

## 2014-09-21 MED ORDER — SODIUM CHLORIDE 0.9 % IV SOLN: 1250.0000 mg | INTRAVENOUS | Status: DC

## 2014-09-21 MED ORDER — VANCOMYCIN HCL 10 G IV SOLR
1500.0000 mg | Freq: Once | INTRAVENOUS | Status: AC
  Administered 2014-09-21: 1500 mg via INTRAVENOUS
  Filled 2014-09-21: qty 1500

## 2014-09-21 MED ORDER — FENTANYL CITRATE (PF) 100 MCG/2ML IJ SOLN
50.0000 ug | INTRAMUSCULAR | Status: DC | PRN
  Administered 2014-09-21 – 2014-09-22 (×5): 50 ug via INTRAVENOUS
  Filled 2014-09-21 (×5): qty 2

## 2014-09-21 MED ORDER — VANCOMYCIN HCL 10 G IV SOLR
1250.0000 mg | Freq: Two times a day (BID) | INTRAVENOUS | Status: DC
  Administered 2014-09-21 – 2014-09-23 (×4): 1250 mg via INTRAVENOUS
  Filled 2014-09-21 (×5): qty 1250

## 2014-09-21 MED ORDER — LEVALBUTEROL HCL 1.25 MG/0.5ML IN NEBU
1.2500 mg | INHALATION_SOLUTION | Freq: Four times a day (QID) | RESPIRATORY_TRACT | Status: DC
  Administered 2014-09-21 (×2): 1.25 mg via RESPIRATORY_TRACT
  Filled 2014-09-21 (×4): qty 0.5

## 2014-09-21 MED ORDER — POTASSIUM CHLORIDE 10 MEQ/100ML IV SOLN
10.0000 meq | INTRAVENOUS | Status: AC
  Administered 2014-09-21 (×4): 10 meq via INTRAVENOUS
  Filled 2014-09-21: qty 100

## 2014-09-21 MED ORDER — ALBUTEROL SULFATE (2.5 MG/3ML) 0.083% IN NEBU
2.5000 mg | INHALATION_SOLUTION | RESPIRATORY_TRACT | Status: DC | PRN
  Administered 2014-09-22 – 2014-09-23 (×2): 2.5 mg via RESPIRATORY_TRACT
  Filled 2014-09-21 (×2): qty 3

## 2014-09-21 NOTE — Consult Note (Addendum)
PULMONARY  / CRITICAL CARE MEDICINE CONSULTATION   Name: Alexis Olson MRN: [id number] DOB: 02-22-1934    ADMISSION DATE:  09/18/2014 CONSULTATION DATE: September 21, 2014  REQUESTING CLINICIAN: Md Ccs, MD PRIMARY SERVICE: CCS  CHIEF COMPLAINT:  Agitation  BRIEF PATIENT DESCRIPTION: 81 y/o woman with colonic perforation from colonoscopy s/p Ex Lap 6/25  SIGNIFICANT EVENTS / STUDIES:  Progressive mental status changes 6/26  LINES / TUBES: CVC - L [place] - 6/24 -->>  CULTURES: Blood cx 6/27 -- pending  ANTIBIOTICS: Ertapnem 6/27--> Vancomycin 6/27--> Cefoxitin 6/24 --> 6/26  HISTORY OF PRESENT ILLNESS:   Alexis Olson is a 81 y/o woman admitted after a colonic perforation during a colonoscopy on 6/24. She underwent ex-lap. Her post-operative course has been notable for worsening tachypnea, elevated WBC and altered mental status. PCCM was consulted for consultation in regarding workup of potential causes for the same.  PAST MEDICAL HISTORY :  Past Medical History  Diagnosis Date  . Obesity   . Lumbosacral disc disease     Chronic pain; History of osteomyelitis 2010 following ESI complication  . ALLERGIC RHINITIS   . 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 ANXIETY   . ASTHMA   . Bariatric surgery status   . COLONIC POLYPS, ADENOMATOUS, HX OF 2010  . DIVERTICULOSIS, COLON   . INSOMNIA   . URINARY URGENCY   . VITAMIN B12 DEFICIENCY   . Hypotension     pt denies  . Hypertension    Past Surgical History  Procedure Laterality Date  . Cholecystectomy    . Tonsillectomy    . Ileojejunal bypass  1976    for obesity  . Back surgery  1994    for ruptured disc   Prior to Admission medications   Medication Sig Start Date End Date Taking? Authorizing Provider  albuterol (PROVENTIL HFA;VENTOLIN HFA) 108 (90 BASE) MCG/ACT inhaler Inhale 1-2 puffs into the lungs every 6 (six) hours as needed for wheezing or shortness of breath.   Yes Historical  Provider, MD  amitriptyline (ELAVIL) 100 MG tablet TAKE 1 TABLET AT BEDTIME 08/28/14  Yes Rowe Clack, MD  cyanocobalamin (,VITAMIN B-12,) 1000 MCG/ML injection Inject 1 mL (1,000 mcg total) into the muscle every 30 (thirty) days. 04/02/14  Yes Lafayette Dragon, MD  hydrOXYzine (ATARAX) 25 MG tablet Take 25 mg by mouth 3 (three) times daily as needed for itching.    Yes Historical Provider, MD  methocarbamol (ROBAXIN) 500 MG tablet TAKE 1 TABLET (500 MG TOTAL) BY MOUTH 3 (THREE) TIMES DAILY AS NEEDED (MUSCLE SPASM). 07/21/13  Yes Rowe Clack, MD  montelukast (SINGULAIR) 10 MG tablet Take 10 mg by mouth at bedtime.  02/11/14  Yes Historical Provider, MD  nystatin (MYCOSTATIN) 100000 UNIT/ML suspension TAKE 5 MLS (500,000 UNITS TOTAL) BY MOUTH 4 (FOUR) TIMES DAILY. 04/21/14  Yes Rowe Clack, MD  PX ENTERIC ASPIRIN 81 MG EC tablet Take 81 mg by mouth daily.  02/05/14  Yes Historical Provider, MD  acetaminophen-codeine (TYLENOL #2) 300-15 MG per tablet TAKE 1 TABLET EVERY 4 HOURS AS NEEDED FOR PAIN 09/28/12   Jearld Fenton, NP  ALPRAZolam Duanne Moron) 1 MG tablet Take 1 tablet (1 mg total) by mouth at bedtime as needed. Patient taking differently: Take 1 mg by mouth at bedtime as needed for sleep.  08/12/14   Rowe Clack, MD  azelastine (OPTIVAR) 0.05 % ophthalmic solution Place 1 drop  into both eyes daily as needed (dry eyes).     Historical Provider, MD  budesonide-formoterol (SYMBICORT) 80-4.5 MCG/ACT inhaler Inhale 2 puffs into the lungs 2 (two) times daily. 02/10/13   Janith Lima, MD  cetirizine (ZYRTEC) 10 MG tablet Take 10 mg by mouth daily.  02/05/14   Historical Provider, MD  cholecalciferol (VITAMIN D) 1000 UNITS tablet Take 1,000 Units by mouth daily.    Historical Provider, MD  colestipol (COLESTID) 5 G granules Take 5 g by mouth daily. 09/18/14   Lafayette Dragon, MD  dicyclomine (BENTYL) 10 MG capsule Take 1 capsule (10 mg total) by mouth 3 (three) times daily. 05/12/14   Lafayette Dragon, MD  diphenoxylate-atropine (LOMOTIL) 2.5-0.025 MG per tablet Take 2 tablets by mouth 2 (two) times daily before a meal. Patient taking differently: Take 2 tablets by mouth 4 (four) times daily.  09/18/14   Lafayette Dragon, MD  Fe Fum-FA-B Cmp-C-Zn-Mg-Mn-Cu (HEMOCYTE PLUS) 106-1 MG CAPS TAKE ONE TABLET BY MOUTH TWICE A DAY 02/10/14   Lafayette Dragon, MD  fluticasone [facility]) 50 MCG/ACT nasal spray 2 sprays by Nasal route daily.      Historical Provider, MD  gabapentin (NEURONTIN) 300 MG capsule Take 300 mg by mouth at bedtime.     Historical Provider, MD  levocetirizine (XYZAL) 5 MG tablet Take 1 tablet (5 mg total) by mouth every evening. 08/04/14   Rowe Clack, MD  magnesium oxide (MAG-OX 400) 400 MG tablet Take 400 mg by mouth 2 (two) times daily.     Historical Provider, MD  omeprazole (PRILOSEC) 20 MG capsule Take 1 capsule (20 mg total) by mouth daily. 05/12/14   Lafayette Dragon, MD  phenazopyridine (PYRIDIUM) 200 MG tablet Take 1 tablet (200 mg total) by mouth 3 (three) times daily as needed for pain. 03/11/14   Rowe Clack, MD  potassium chloride SA (K-DUR,KLOR-CON) 20 MEQ tablet Take 1 tablet (20 mEq total) by mouth daily. 08/11/14   Rowe Clack, MD  valACYclovir (VALTREX) 500 MG tablet Take 500 mg by mouth as needed.      Historical Provider, MD  vitamin E 400 UNIT capsule Take 400 Units by mouth daily.      Historical Provider, MD  zafirlukast (ACCOLATE) 20 MG tablet Take 20 mg by mouth daily as needed (for breathing). Take 1 by mouth twice a day 11/13/11   Historical Provider, MD  zolpidem (AMBIEN) 5 MG tablet Take 1 tablet (5 mg total) by mouth at bedtime. 08/14/14   Rowe Clack, MD   Allergies  Allergen Reactions  . Other Anaphylaxis    Red peppers--choking  . Azithromycin Other (See Comments)    Can't move joints  . Benadryl [Diphenhydramine Hcl] Other (See Comments)    Paradoxical response.  . Ciprofloxacin Hcl     REACTION: causes yeast inf  and  refuses to take  . Codeine Phosphate     REACTION: unspecified  . Hydrocodone Itching and Nausea And Vomiting  . Hydrocodone-Homatropine Itching and Nausea And Vomiting    REACTION: unspecified  . Levaquin [Levofloxacin In D5w] Other (See Comments)    Muscle soreness  . Meperidine Hcl   . Sulfa Antibiotics Other (See Comments)    Joint pain  . Ultram [Tramadol Hcl] Other (See Comments)    Can't move joints    FAMILY HISTORY:  Family History  Problem Relation Age of Onset  . Cervical cancer Mother   . Liver disease Sister   .  Kidney disease Sister   . Cirrhosis Sister   . Colon cancer Neg Hx   . Esophageal cancer Neg Hx   . Rectal cancer Neg Hx   . Stomach cancer Neg Hx    SOCIAL HISTORY:  reports that she has quit smoking. She has never used smokeless tobacco. She reports that she does not drink alcohol or use illicit drugs.  REVIEW OF SYSTEMS:  Unable to assess  SUBJECTIVE:   VITAL SIGNS: Temp:  [97.8 F (36.6 C)-99.3 F (37.4 C)] 99.3 F (37.4 C) (06/26 2300) Pulse Rate:  [111-117] 111 (06/26 2300) Resp:  [15-25] 22 (06/26 2338) BP: (110-146)/(66-76) 130/66 mmHg (06/26 2300) SpO2:  [92 %-95 %] 95 % (06/26 2338) HEMODYNAMICS:   VENTILATOR SETTINGS:   INTAKE / OUTPUT: Intake/Output      06/26 0701 - 06/27 0700   Urine (mL/kg/hr) 2025 (0.9)   Emesis/NG output 80 (0)   Total Output 2105   Net -2105         PHYSICAL EXAMINATION: General:  Elderly woman lying in bed, awake Neuro:  Awake, but drowsy. Oriented to self only. Unable to correctly identify family members (called daughter her sister) HEENT:  MMM Neck: No JVD Cardiovascular:  Tachycardia Lungs:  Diffuse expiratory wheezes heard throughout. Abdomen:  Hypoactive bowel sounds heard. Musculoskeletal:  No deformities Skin:  No rashes  LABS:  CBC  Recent Labs Lab 09/19/14 0714 09/20/14 2103 09/21/14 0021  WBC 14.7* 13.4* 12.6*  HGB 11.5* 10.8* 10.8*  HCT 35.8* 33.7* 33.3*  PLT 207 194 193    Coag's No results for input(s): APTT, INR in the last 168 hours. BMET  Recent Labs Lab 09/19/14 0714 09/21/14 0021 09/21/14 0039  NA 138 139 139  K 3.4* 2.9* 2.9*  CL 109 105 105  CO2 23 29 29   BUN <5* <5* <5*  CREATININE 0.56 0.46 0.46  GLUCOSE 119* 128* 128*   Electrolytes  Recent Labs Lab 09/19/14 0714 09/21/14 0021 09/21/14 0039  CALCIUM 7.6* 8.3* 8.3*   Sepsis Markers  Recent Labs Lab 09/21/14 0025  LATICACIDVEN 1.7   ABG  Recent Labs Lab 09/20/14 2234  PHART 7.484*  PCO2ART 36.4  PO2ART 63.2*   Liver Enzymes  Recent Labs Lab 09/15/14 1636 09/21/14 0021 09/21/14 0039  AST 15 25 25   ALT 14 17 17   ALKPHOS 47 42 42  BILITOT 0.3 0.3 0.3  ALBUMIN 4.0 2.6* 2.6*   Cardiac Enzymes  Recent Labs Lab 09/21/14 0039  TROPONINI 0.12*   Glucose No results for input(s): GLUCAP in the last 168 hours.  Imaging Dg Chest Port 1 View  09/20/2014   CLINICAL DATA:  Subsequent evaluation postoperatively after bowel perforation  EXAM: PORTABLE CHEST - 1 VIEW  COMPARISON:  09/18/2014  FINDINGS: Stable central line allowing for differences in rotation. NG tube crosses the gastroesophageal junction with side hole just below the diaphragm.  No change in the appearance of mediastinum. Stable cardiac silhouette. Bronchitic change and mild background interstitial prominence stable. Retrocardiac left lower lobe opacity new from the prior study likely represents atelectasis although pneumonia/ pneumonitis is not excluded. There is also medial right lung base atelectasis.  IMPRESSION: Similar appearance to prior study with new left lower lobe consolidation medially. This could reflect atelectasis. Pneumonia/pneumonitis not excluded.   Electronically Signed   By: Skipper Cliche M.D.   On: 09/20/2014 18:40    EKG: sinus tach, unchanged from prior. CXR: bilateral patchy infiltrates, CT shows dense consolidation in RLL.  ASSESSMENT /  PLAN:  PULMONARY A: Aspirated  Pneumonitis Asthma P:   Continue supportive care, broad-spectrum antibiotics. Pulmonary toilet Early mobility Scheduled albuterol Will need to address delirium as underlying cause.  CARDIOVASCULAR A: Sinus Tachycardia P:   May be related to sepsis; treating for the same. LA normal. Obtain EKG  RENAL A: Hypokalemia P:   Due to gastric losses Repletion in process Hypoalbuminemia  GASTROINTESTINAL A: Colonic perf s/p open repair Ileus P:   Per primary team May need to consider TPN  HEMATOLOGIC A: Persistent Leukocytosis P:   Likely reactive, less likely sepsis Empiric antibiotics  INFECTIOUS A: Aspiration, possible intra-abdominal sepsis P:   Re-cx. Broadened to Ertapenem and Vanc.   ENDOCRINE A: No active issues P:    NEUROLOGIC A: Delirium P:   Patient is geriatiric, s/p major abdominal surgery, and has received numerous deleriogenic medications. This predisposes to aspiration, etc. D/c Ativan. Pain seems to be less of an issue, will trial off of narcotics. D/c benadryl as family reports she responds poorly to this as well. Consider Zyprexa ODT for agitation.  TODAY'S SUMMARY: 81 y/o POD 3 from ex-lap from colonic perforation now w/ delirium.  I have personally obtained a history, examined the patient, evaluated laboratory and imaging results, formulated the assessment and plan and placed orders.  CRITICAL CARE: The patient is critically ill with multiple organ systems failure and requires high complexity decision making for assessment and support, frequent evaluation and titration of therapies, application of advanced monitoring technologies and extensive interpretation of multiple databases. Critical Care Time devoted to patient care services described in this note is 50 minutes.   Luz Brazen, MD Pulmonary & Critical Care Medicine September 21, 2014, 1:54 AM

## 2014-09-21 NOTE — Care Management Note (Signed)
Case Management Note  Patient Details  Name: Alexis Olson MRN: [id number] Date of Birth: Apr 19, 1933  Subjective/Objective:                 Pt from home with husband admitted with perforated cecum, s/p ex. Lap and cecum repair.   Action/Plan: Return to home when medically stable. CM to f/u with d/c needs.  Expected Discharge Date:                  Expected Discharge Plan:  Home/Self Care  In-House Referral:     Discharge planning Services  CM Consult  Post Acute Care Choice:    Choice offered to:     DME Arranged:    DME Agency:     HH Arranged:    HH Agency:     Status of Service:  In process, will continue to follow  Medicare Important Message Given:    Date Medicare IM Given:    Medicare IM give by:    Date Additional Medicare IM Given:    Additional Medicare Important Message give by:     If discussed at [place] of Stay Meetings, dates discussed:    Additional Comments: Cherrie Gauze (daughter), [phone] Sharin Mons, RN 09/21/2014, 10:40 AM

## 2014-09-21 NOTE — Progress Notes (Addendum)
ANTIBIOTIC CONSULT NOTE - INITIAL  Pharmacy Consult for Vancomycin  Indication: rule out sepsis  Allergies  Allergen Reactions  . Other Anaphylaxis    Red peppers--choking  . Azithromycin Other (See Comments)    Can't move joints  . Benadryl [Diphenhydramine Hcl] Other (See Comments)    Paradoxical response.  . Ciprofloxacin Hcl     REACTION: causes yeast inf  and refuses to take  . Codeine Phosphate     REACTION: unspecified  . Hydrocodone Itching and Nausea And Vomiting  . Hydrocodone-Homatropine Itching and Nausea And Vomiting    REACTION: unspecified  . Levaquin [Levofloxacin In D5w] Other (See Comments)    Muscle soreness  . Meperidine Hcl   . Sulfa Antibiotics Other (See Comments)    Joint pain  . Ultram [Tramadol Hcl] Other (See Comments)    Can't move joints    Patient Measurements: Height: 5\' 5"  (165.1 cm) Weight: 210 lb 1.6 oz (95.3 kg) IBW/kg (Calculated) : 57 Adjusted Body Weight: 75 kg  Vital Signs: Temp: 99.3 F (37.4 C) (06/26 2300) Temp Source: Oral (06/26 2300) BP: 130/66 mmHg (06/26 2300) Pulse Rate: 111 (06/26 2300) Intake/Output from previous day: 06/26 0701 - 06/27 0700 In: -  Out: 2105 [Urine:2025; Emesis/NG output:80] Intake/Output from this shift: Total I/O In: -  Out: 525 [Urine:525]  Labs:  Recent Labs  09/19/14 0714 09/20/14 2103 09/21/14 0021 09/21/14 0039  WBC 14.7* 13.4* 12.6*  --   HGB 11.5* 10.8* 10.8*  --   PLT 207 194 193  --   CREATININE 0.56  --  0.46 0.46   Estimated Creatinine Clearance: 65.1 mL/min (by C-G formula based on Cr of 0.46). No results for input(s): VANCOTROUGH, VANCOPEAK, VANCORANDOM, GENTTROUGH, GENTPEAK, GENTRANDOM, TOBRATROUGH, TOBRAPEAK, TOBRARND, AMIKACINPEAK, AMIKACINTROU, AMIKACIN in the last 72 hours.   Microbiology: Recent Results (from the past 720 hour(s))  MRSA PCR Screening     Status: None   Collection Time: 09/18/14  9:52 PM  Result Value Ref Range Status   MRSA by PCR NEGATIVE  NEGATIVE Final    Comment:        The GeneXpert MRSA Assay (FDA approved for NASAL specimens only), is one component of a comprehensive MRSA colonization surveillance program. It is not intended to diagnose MRSA infection nor to guide or monitor treatment for MRSA infections.     Medical History: Past Medical History  Diagnosis Date  . Obesity   . Lumbosacral disc disease     Chronic pain; History of osteomyelitis 2010 following ESI complication  . ALLERGIC RHINITIS   . 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 ANXIETY   . ASTHMA   . Bariatric surgery status   . COLONIC POLYPS, ADENOMATOUS, HX OF 2010  . DIVERTICULOSIS, COLON   . INSOMNIA   . URINARY URGENCY   . VITAMIN B12 DEFICIENCY   . Hypotension     pt denies  . Hypertension     Medications:  Scheduled:  . enoxaparin (LOVENOX) injection  30 mg Subcutaneous Q12H  . ertapenem  1 g Intravenous Q24H  . potassium chloride  10 mEq Intravenous Q1 Hr x 4   Assessment: 81 y.o. female s/p repair perforated cecum, now with tachycardia/tachypnea, possible sepsis, for empiric antibiotics  Goal of Therapy:  Vancomycin trough level 15-20 mcg/ml  Plan:  Vancomycin 1500 mg IV now, then 1250 mg IV q24h  Caryl Pina 09/21/2014,2:07 AM   Addendum: Renal function reviewed.  CrCl ~ 65.  Will adjust Vancomycin dose to 1250 mg IV q12h.  [employer], Pharm.D., BCPS Clinical Pharmacist Pager [phone] 09/21/2014 12:21 PM

## 2014-09-21 NOTE — Progress Notes (Addendum)
Audible wheezes and crackles heard in the upper airways. Pt. Belly breathing and working harder to breathe. Called respiratory to give patient prn albulterol treatment. Dr Elsworth Soho notified to see if a diuretic would be beneficial at this time. No new orders given. Will continue to monitor pt.

## 2014-09-21 NOTE — Progress Notes (Signed)
Pt. Pulled NG tube out. MD Cornett aware and it is okay to leave NG tube out. Will continue to monitor.

## 2014-09-21 NOTE — Progress Notes (Signed)
[facility] Progress Note Patient Name: Alexis Olson DOB: Oct 14, 1933 MRN: [id number]   Date of Service  09/21/2014  HPI/Events of Note    eICU Interventions  Hypokalemia -repleted      Intervention Category Intermediate Interventions: Electrolyte abnormality - evaluation and management  ALVA,RAKESH V. 09/21/2014, 1:27 AM

## 2014-09-21 NOTE — Care Management (Signed)
Important Message  Patient Details  Name: Alexis Olson MRN: [id number] Date of Birth: September 14, 1933   Medicare Important Message Given:  Yes-second notification given    Nathen May 09/21/2014, 4:22 PM

## 2014-09-21 NOTE — Progress Notes (Signed)
Patient c/o SOB with audible expiratory wheezes. CCM called. Dr. Oletta Darter returned page. New orders given. Will continue to monitor.

## 2014-09-21 NOTE — Progress Notes (Signed)
3 Days Post-Op  Subjective: Patient awake, more alert Oriented to place and time Daughter at bedside Patient does not indicate much pain - on no narcotics  Objective: Vital signs in last 24 hours: Temp:  [98 F (36.7 C)-99.3 F (37.4 C)] 99.1 F (37.3 C) (06/27 0324) Pulse Rate:  [110-117] 110 (06/27 0300) Resp:  [16-25] 18 (06/27 0300) BP: (123-146)/(66-76) 123/72 mmHg (06/27 0300) SpO2:  [91 %-95 %] 91 % (06/27 0300)    Intake/Output from previous day: 06/26 0701 - 06/27 0700 In: 200 [IV Piggyback:200] Out: 2455 [Urine:2375; Emesis/NG output:80] Intake/Output this shift:    General appearance: alert, cooperative and no distress Resp: mild RLQ rhonchi; otherwise lungs clear Cardio: regular rate and rhythm, S1, S2 normal, no murmur, click, rub or gallop GI: hypoactive bowel sounds; mild incisional tenderness Wound - very clean; no granulation tissue; minimal drainage  Lab Results:   Recent Labs  09/20/14 2103 09/21/14 0021  WBC 13.4* 12.6*  HGB 10.8* 10.8*  HCT 33.7* 33.3*  PLT 194 193   BMET  Recent Labs  09/21/14 0021 09/21/14 0039  NA 139 139  K 2.9* 2.9*  CL 105 105  CO2 29 29  GLUCOSE 128* 128*  BUN <5* <5*  CREATININE 0.46 0.46  CALCIUM 8.3* 8.3*   PT/INR No results for input(s): LABPROT, INR in the last 72 hours. ABG  Recent Labs  09/20/14 2234  PHART 7.484*  HCO3 27.0*    Studies/Results: Dg Chest Port 1 View  09/20/2014   CLINICAL DATA:  Subsequent evaluation postoperatively after bowel perforation  EXAM: PORTABLE CHEST - 1 VIEW  COMPARISON:  09/18/2014  FINDINGS: Stable central line allowing for differences in rotation. NG tube crosses the gastroesophageal junction with side hole just below the diaphragm.  No change in the appearance of mediastinum. Stable cardiac silhouette. Bronchitic change and mild background interstitial prominence stable. Retrocardiac left lower lobe opacity new from the prior study likely represents atelectasis  although pneumonia/ pneumonitis is not excluded. There is also medial right lung base atelectasis.  IMPRESSION: Similar appearance to prior study with new left lower lobe consolidation medially. This could reflect atelectasis. Pneumonia/pneumonitis not excluded.   Electronically Signed   By: Skipper Cliche M.D.   On: 09/20/2014 18:40    Anti-infectives: Anti-infectives    Start     Dose/Rate Route Frequency Ordered Stop   09/22/14 0600  vancomycin (VANCOCIN) 1,250 mg in sodium chloride 0.9 % 250 mL IVPB     1,250 mg 166.7 mL/hr over 90 Minutes Intravenous Every 24 hours 09/21/14 0211     09/21/14 0300  vancomycin (VANCOCIN) 1,500 mg in sodium chloride 0.9 % 500 mL IVPB     1,500 mg 250 mL/hr over 120 Minutes Intravenous  Once 09/21/14 0211 09/21/14 0504   09/21/14 0000  ertapenem (INVANZ) 1 g in sodium chloride 0.9 % 50 mL IVPB     1 g 100 mL/hr over 30 Minutes Intravenous Every 24 hours 09/20/14 2347     09/20/14 2200  piperacillin-tazobactam (ZOSYN) IVPB 3.375 g  Status:  Discontinued     3.375 g 100 mL/hr over 30 Minutes Intravenous 3 times per day 09/20/14 1805 09/20/14 1811   09/18/14 2300  cefOXitin (MEFOXIN) 2 g in dextrose 5 % 50 mL IVPB  Status:  Discontinued     2 g 100 mL/hr over 30 Minutes Intravenous 3 times per day 09/18/14 1540 09/20/14 1938   09/18/14 1445  cefTRIAXone (ROCEPHIN) 1 g in dextrose 5 % 50  mL IVPB     1 g 100 mL/hr over 30 Minutes Intravenous  Once 09/18/14 1435 09/18/14 1514   09/18/14 1445  metroNIDAZOLE (FLAGYL) IVPB 500 mg     500 mg 100 mL/hr over 60 Minutes Intravenous  Once 09/18/14 1435 09/18/14 1617      Assessment/Plan: s/p Procedure(s): EXPLORATORY LAPAROTOMY WITH REPAIR OF CECIAL PERFORATION (N/A) Advance diet to some clear liquids Encourage pulmonary toilet Physical therapy D/C foley Replete K Continue Vanc/ Invanz for intra-abdominal infection/ RLL pneumonia Remain in step-down unit for now.  LOS: 3 days    TSUEI,MATTHEW  K. 09/21/2014

## 2014-09-21 NOTE — Progress Notes (Signed)
Full dose dilaudid PCA discontinued 84ml of dilaudid wasted in sink by [employer] Work, Therapist, sports and Dealer.

## 2014-09-21 NOTE — Progress Notes (Signed)
Foley catheter removed this morning 0900. Pt bladder scanned at 1600 after patient unable to void, denies any urge to void, no pain, pressure or cramping. Bladder scanner showed 637 cc. Patient was in/out catheterized with Di Kindle, RN assisting this RN as per protocol. 775 cc were drained from bladder. Will continue to monitor.

## 2014-09-21 NOTE — Progress Notes (Signed)
eLink Physician-Brief Progress Note Patient Name: Alexis Olson DOB: 08/26/1933 MRN: [id number]   Date of Service  09/21/2014  HPI/Events of Note  Patient c/o SOB. Hx Asthma. Albuterol Neb Q 6 PRN ordered. HR = 113.  eICU Interventions  Will order: 1. Xopenex 1.25 mg Neb Q 4 hours.  2. Albuterol 2.5 mg Neb Q 2 hours PRN as a rescue dose.     Intervention Category Intermediate Interventions: Respiratory distress - evaluation and management  Sommer,Steven Eugene 09/21/2014, 6:50 PM

## 2014-09-21 NOTE — Telephone Encounter (Signed)
  Follow up Call-  Call back number 09/18/2014  Post procedure Call Back phone  # [phone]  Permission to leave phone message Yes     Patient questions:  Left message to call us with progress.

## 2014-09-22 ENCOUNTER — Inpatient Hospital Stay (HOSPITAL_COMMUNITY): Payer: Commercial Managed Care - HMO

## 2014-09-22 ENCOUNTER — Encounter: Payer: Self-pay | Admitting: Internal Medicine

## 2014-09-22 DIAGNOSIS — A419 Sepsis, unspecified organism: Secondary | ICD-10-CM

## 2014-09-22 DIAGNOSIS — J4531 Mild persistent asthma with (acute) exacerbation: Secondary | ICD-10-CM

## 2014-09-22 DIAGNOSIS — R0902 Hypoxemia: Secondary | ICD-10-CM

## 2014-09-22 DIAGNOSIS — F19921 Other psychoactive substance use, unspecified with intoxication with delirium: Secondary | ICD-10-CM

## 2014-09-22 DIAGNOSIS — J189 Pneumonia, unspecified organism: Secondary | ICD-10-CM

## 2014-09-22 LAB — URINALYSIS, ROUTINE W REFLEX MICROSCOPIC
Bilirubin Urine: NEGATIVE
Glucose, UA: NEGATIVE mg/dL
Ketones, ur: NEGATIVE mg/dL
Leukocytes, UA: NEGATIVE
Nitrite: NEGATIVE
Protein, ur: NEGATIVE mg/dL
Specific Gravity, Urine: 1.009 (ref 1.005–1.030)
Urobilinogen, UA: 0.2 mg/dL (ref 0.0–1.0)
pH: 6.5 (ref 5.0–8.0)

## 2014-09-22 LAB — URINE MICROSCOPIC-ADD ON

## 2014-09-22 LAB — BLOOD GAS, ARTERIAL
Acid-Base Excess: 0.6 mmol/L (ref 0.0–2.0)
Bicarbonate: 23.7 mEq/L (ref 20.0–24.0)
Drawn by: 441261
O2 Content: 2 L/min
O2 Saturation: 92.3 %
Patient temperature: 98.6
TCO2: 24.7 mmol/L (ref 0–100)
pCO2 arterial: 31.9 mmHg — ABNORMAL LOW (ref 35.0–45.0)
pH, Arterial: 7.484 — ABNORMAL HIGH (ref 7.350–7.450)
pO2, Arterial: 65.5 mmHg — ABNORMAL LOW (ref 80.0–100.0)

## 2014-09-22 LAB — BASIC METABOLIC PANEL
Anion gap: 5 (ref 5–15)
Anion gap: 4 — ABNORMAL LOW (ref 5–15)
BUN: <5 mg/dL — ABNORMAL LOW (ref 6–20)
BUN: <5 mg/dL — ABNORMAL LOW (ref 6–20)
CO2: 27 mmol/L (ref 22–32)
CO2: 28 mmol/L (ref 22–32)
Calcium: 7.7 mg/dL — ABNORMAL LOW (ref 8.9–10.3)
Calcium: 7.8 mg/dL — ABNORMAL LOW (ref 8.9–10.3)
Chloride: 105 mmol/L (ref 101–111)
Chloride: 107 mmol/L (ref 101–111)
Creatinine, Ser: 0.43 mg/dL — ABNORMAL LOW (ref 0.44–1.00)
Creatinine, Ser: 0.44 mg/dL (ref 0.44–1.00)
GFR calc Af Amer: >60 mL/min (ref >60)
GFR calc Af Amer: >60 mL/min (ref >60)
GFR calc non Af Amer: >60 mL/min (ref >60)
GFR calc non Af Amer: >60 mL/min (ref >60)
Glucose, Bld: 158 mg/dL — ABNORMAL HIGH (ref 65–99)
Glucose, Bld: 161 mg/dL — ABNORMAL HIGH (ref 65–99)
Potassium: 2.4 mmol/L — CL (ref 3.5–5.1)
Potassium: 2.4 mmol/L — CL (ref 3.5–5.1)
Sodium: 137 mmol/L (ref 135–145)
Sodium: 139 mmol/L (ref 135–145)

## 2014-09-22 LAB — CBC
HCT: 32.7 % — ABNORMAL LOW (ref 36.0–46.0)
Hemoglobin: 10.6 g/dL — ABNORMAL LOW (ref 12.0–15.0)
MCH: 30.9 pg (ref 26.0–34.0)
MCHC: 32.4 g/dL (ref 30.0–36.0)
MCV: 95.3 fL (ref 78.0–100.0)
Platelets: 204 10*3/uL (ref 150–400)
RBC: 3.43 MIL/uL — ABNORMAL LOW (ref 3.87–5.11)
RDW: 13.9 % (ref 11.5–15.5)
WBC: 9.3 10*3/uL (ref 4.0–10.5)

## 2014-09-22 LAB — TROPONIN I
Troponin I: 0.04 ng/mL — ABNORMAL HIGH (ref <0.031)
Troponin I: 0.03 ng/mL (ref <0.031)
Troponin I: <0.03 ng/mL (ref <0.031)

## 2014-09-22 LAB — URINE CULTURE: Culture: NO GROWTH

## 2014-09-22 LAB — MAGNESIUM: Magnesium: 1.8 mg/dL (ref 1.7–2.4)

## 2014-09-22 LAB — CLOSTRIDIUM DIFFICILE BY PCR: Toxigenic C. Difficile by PCR: NEGATIVE

## 2014-09-22 MED ORDER — IOHEXOL 350 MG/ML SOLN
100.0000 mL | Freq: Once | INTRAVENOUS | Status: AC | PRN
  Administered 2014-09-22: 100 mL via INTRAVENOUS

## 2014-09-22 MED ORDER — POTASSIUM CHLORIDE CRYS ER 20 MEQ PO TBCR
40.0000 meq | EXTENDED_RELEASE_TABLET | Freq: Three times a day (TID) | ORAL | Status: DC
  Administered 2014-09-22: 40 meq via ORAL
  Filled 2014-09-22: qty 2

## 2014-09-22 MED ORDER — MAGNESIUM SULFATE 2 GM/50ML IV SOLN
2.0000 g | Freq: Once | INTRAVENOUS | Status: AC
  Administered 2014-09-22: 2 g via INTRAVENOUS
  Filled 2014-09-22: qty 50

## 2014-09-22 MED ORDER — ARFORMOTEROL TARTRATE 15 MCG/2ML IN NEBU
15.0000 ug | INHALATION_SOLUTION | Freq: Two times a day (BID) | RESPIRATORY_TRACT | Status: DC
  Administered 2014-09-22 – 2014-09-26 (×9): 15 ug via RESPIRATORY_TRACT
  Filled 2014-09-22 (×11): qty 2

## 2014-09-22 MED ORDER — POTASSIUM CHLORIDE 10 MEQ/50ML IV SOLN
10.0000 meq | INTRAVENOUS | Status: AC
  Administered 2014-09-22 (×6): 10 meq via INTRAVENOUS
  Filled 2014-09-22 (×6): qty 50

## 2014-09-22 MED ORDER — LEVALBUTEROL HCL 1.25 MG/0.5ML IN NEBU: 1.2500 mg | INHALATION_SOLUTION | RESPIRATORY_TRACT | Status: DC

## 2014-09-22 MED ORDER — DIAZEPAM 5 MG PO TABS
5.0000 mg | ORAL_TABLET | Freq: Three times a day (TID) | ORAL | Status: DC | PRN
  Administered 2014-09-22: 5 mg via ORAL
  Filled 2014-09-22: qty 1

## 2014-09-22 MED ORDER — LEVALBUTEROL HCL 1.25 MG/0.5ML IN NEBU
1.2500 mg | INHALATION_SOLUTION | RESPIRATORY_TRACT | Status: DC
  Administered 2014-09-22 – 2014-09-23 (×7): 1.25 mg via RESPIRATORY_TRACT
  Filled 2014-09-22 (×13): qty 0.5

## 2014-09-22 MED ORDER — MAGNESIUM SULFATE 50 % IJ SOLN: 3.0000 g | Freq: Once | INTRAMUSCULAR | Status: DC

## 2014-09-22 MED ORDER — METHYLPREDNISOLONE SODIUM SUCC 40 MG IJ SOLR
40.0000 mg | Freq: Every day | INTRAMUSCULAR | Status: DC
  Administered 2014-09-22: 40 mg via INTRAVENOUS
  Filled 2014-09-22 (×2): qty 1

## 2014-09-22 MED ORDER — DEXTROSE 5 % IV SOLN
3.0000 g | Freq: Once | INTRAVENOUS | Status: AC
  Administered 2014-09-22: 3 g via INTRAVENOUS
  Filled 2014-09-22: qty 6

## 2014-09-22 MED ORDER — BUDESONIDE 0.25 MG/2ML IN SUSP
0.5000 mg | Freq: Two times a day (BID) | RESPIRATORY_TRACT | Status: DC
  Administered 2014-09-22: 0.25 mg via RESPIRATORY_TRACT
  Administered 2014-09-22 – 2014-09-26 (×8): 0.5 mg via RESPIRATORY_TRACT
  Filled 2014-09-22 (×11): qty 4

## 2014-09-22 MED ORDER — POTASSIUM CHLORIDE CRYS ER 20 MEQ PO TBCR
40.0000 meq | EXTENDED_RELEASE_TABLET | Freq: Two times a day (BID) | ORAL | Status: DC
  Filled 2014-09-22: qty 2

## 2014-09-22 MED ORDER — FENTANYL CITRATE (PF) 100 MCG/2ML IJ SOLN
12.5000 ug | INTRAMUSCULAR | Status: DC | PRN
  Administered 2014-09-22 – 2014-09-23 (×3): 25 ug via INTRAVENOUS
  Administered 2014-09-23 (×4): 12.5 ug via INTRAVENOUS
  Administered 2014-09-23 – 2014-09-24 (×3): 25 ug via INTRAVENOUS
  Filled 2014-09-22 (×10): qty 2

## 2014-09-22 NOTE — Progress Notes (Signed)
PULMONARY  / CRITICAL CARE MEDICINE CONSULTATION   Name: Alexis Olson MRN: [id number] DOB: 1933/09/03    ADMISSION DATE:  09/18/2014 CONSULTATION DATE: September 22, 2014  REQUESTING CLINICIAN: Md Ccs, MD PRIMARY SERVICE: CCS  CHIEF COMPLAINT:  Agitation  BRIEF PATIENT DESCRIPTION:  81 y/o woman with colonic perforation from colonoscopy s/p Ex Lap 6/25. PCCM asked to see 6/27 for delirium, SIRS and increased WOB.    SIGNIFICANT EVENTS / STUDIES:  Progressive mental status changes 6/26  LINES / TUBES: CVC - L Leonard - 6/24 -->>  CULTURES: Blood cx 6/27 -- pending  ANTIBIOTICS: Ertapnem 6/27--> Vancomycin 6/27--> Cefoxitin 6/24 --> 6/26  SUBJECTIVE:  Feeling better   VITAL SIGNS: Temp:  [98.3 F (36.8 C)-99.8 F (37.7 C)] 98.9 F (37.2 C) (06/28 0747) Pulse Rate:  [102-118] 108 (06/28 0400) Resp:  [17-27] 22 (06/28 0400) BP: (123-146)/(55-91) 146/67 mmHg (06/28 0400) SpO2:  [93 %-100 %] 93 % (06/28 1111) 3 liters HEMODYNAMICS:   VENTILATOR SETTINGS:   INTAKE / OUTPUT: Intake/Output      06/27 0701 - 06/28 0700 06/28 0701 - 06/29 0700   P.O. 280    I.V. (mL/kg) 2489.6 (26.1)    IV Piggyback 600    Total Intake(mL/kg) 3369.6 (35.4)    Urine (mL/kg/hr) 2575 (1.1)    Emesis/NG output     Stool 0 (0)    Total Output 2575     Net +794.6          Stool Occurrence 4 x      PHYSICAL EXAMINATION: General:  Elderly woman lying in bed, awake, now in no distress Neuro:  Awake, but drowsy. Now fully oriented HEENT:  MMM Neck: No JVD Cardiovascular:  Tachycardia Lungs: clear, decreased in bases  Abdomen:  Hyperactive bowel sounds heard. Musculoskeletal:  No deformities Skin:  No rashes  LABS:  CBC  Recent Labs Lab 09/20/14 2103 09/21/14 0021 09/22/14 0823  WBC 13.4* 12.6* 9.3  HGB 10.8* 10.8* 10.6*  HCT 33.7* 33.3* 32.7*  PLT 194 193 204   Coag's No results for input(s): APTT, INR in the last 168 hours. BMET  Recent Labs Lab 09/21/14 0021  09/21/14 0039 09/22/14 0823  NA 139 139 137  K 2.9* 2.9* 2.4*  CL 105 105 105  CO2 29 29 27   BUN <5* <5* <5*  CREATININE 0.46 0.46 0.43*  GLUCOSE 128* 128* 158*   Electrolytes  Recent Labs Lab 09/21/14 0021 09/21/14 0039 09/22/14 0823  CALCIUM 8.3* 8.3* 7.7*  MG  --   --  1.8   Sepsis Markers  Recent Labs Lab 09/21/14 0025  LATICACIDVEN 1.7   ABG  Recent Labs Lab 09/20/14 2234  PHART 7.484*  PCO2ART 36.4  PO2ART 63.2*   Liver Enzymes  Recent Labs Lab 09/15/14 1636 09/21/14 0021 09/21/14 0039  AST 15 25 25   ALT 14 17 17   ALKPHOS 47 42 42  BILITOT 0.3 0.3 0.3  ALBUMIN 4.0 2.6* 2.6*   Cardiac Enzymes  Recent Labs Lab 09/21/14 0039 09/22/14 0823  TROPONINI 0.12* 0.04*   Glucose No results for input(s): GLUCAP in the last 168 hours.  Imaging Dg Chest Port 1 View  09/22/2014   CLINICAL DATA:  Dyspnea  EXAM: PORTABLE CHEST - 1 VIEW  COMPARISON:  09/20/2014  FINDINGS: The nasogastric tube has been removed. The left subclavian central line extends into the SVC near the azygos vein junction. There is persistent consolidation in the left base. Right lung remains unchanged.  IMPRESSION: NG  tube removal.  Unchanged left base consolidation.   Electronically Signed   By: Andreas Newport M.D.   On: 09/22/2014 04:04   Dg Chest Port 1 View  09/20/2014   CLINICAL DATA:  Subsequent evaluation postoperatively after bowel perforation  EXAM: PORTABLE CHEST - 1 VIEW  COMPARISON:  09/18/2014  FINDINGS: Stable central line allowing for differences in rotation. NG tube crosses the gastroesophageal junction with side hole just below the diaphragm.  No change in the appearance of mediastinum. Stable cardiac silhouette. Bronchitic change and mild background interstitial prominence stable. Retrocardiac left lower lobe opacity new from the prior study likely represents atelectasis although pneumonia/ pneumonitis is not excluded. There is also medial right lung base atelectasis.   IMPRESSION: Similar appearance to prior study with new left lower lobe consolidation medially. This could reflect atelectasis. Pneumonia/pneumonitis not excluded.   Electronically Signed   By: Skipper Cliche M.D.   On: 09/20/2014 18:40    EKG: sinus tach, unchanged from prior. CXR: bilateral patchy infiltrates,  L>R ASSESSMENT / PLAN:  Mild hypoxia -->improving Bibasilar L>R airspace disease. Likely atelectasis but can't r/o HCAP vs aspiration Asthmatic exacerbation   Plan:   Continue supportive care, broad-spectrum antibiotics. Pulmonary toilet Early mobility Scheduled albuterol   Colonic perf s/p open repair in setting of intra-abd sepsis  Ileus Plan:   Per primary team May need to consider TPN Cont vanc and invanz started 7/27>>>  Delirium Patient is geriatiric, s/p major abdominal surgery, and had received numerous deleriogenic medications. Now much improved after stopping both ativan and benadryl  Plan Cont supportive care.   Erick Colace ACNP-BC [place] Pager # [phone] OR # [phone] if no answer  Attending Note:  81 year old female underwent a colonoscopy that had a perforation with resulting sepsis and agitation resulting in use of ativan.  Developed hypoxemia and bilateral L>R PNA.  Diffuse crackles on exam.  I reviewed CXR myself, evidence of diffuse L>R infiltrate.  Discussed with daughter and PCCM-NP.  Pneumonia: likely aspiration.  - Continue broad spectrum abx.  - F/u on culture.  - Pulmonary toilet.  - Bronchodilators as ordered.  - Monitor for further aspiration.  Hypoxemia: due to PNA.  - As above.  - Titrate O2 for sat of 88-92%.  - Anticipate will need an ambulatory desaturation study prior to discharge.  Sepsis: due to colonic perf.  - Continue abx.  - Prevent further perf.  Delirium: due to sepsis and medication use.  - Minimize sedation.  - Monitored settings.  - Frequent re-orientation.  Patient seen and  examined, agree with above note.  I dictated the care and orders written for this patient under my direction.  Rush Farmer, M.D. [facility] Pulmonary/Critical Care Medicine. Pager: [phone]. After hours pager: [phone].

## 2014-09-22 NOTE — Progress Notes (Signed)
Pt c/o increased sob. Pt is audibly wheezing and have difficulty speaking. Pt encouraged to cough and deep breath. Pt pox 95% on 3l of O2, Resp23 per min. Dr. Elsworth Soho paged and updated on pt status. Dr. Elsworth Soho gave orders. Orders followed. Will continue to monitor pt.

## 2014-09-22 NOTE — Evaluation (Signed)
Physical Therapy Evaluation Patient Details Name: Alexis Olson MRN: [id number] DOB: 27-Apr-1933 Today's Date: 09/22/2014   History of Present Illness  Patient is a 81 y/o female s/p exp lap on 6/25 following colonoscopy with colonic perforation.  PMH positive for lumbar disc surgery, bariatric surgery, anemia, anxiety and asthma.  Clinical Impression  Patient presents with decreased mobility due to deficits listed in PT problem list below.  She will benefit from skilled PT in the acute setting to allow return home with family assist and HHPT.  Patient limited today by bowel issues as well as multiple lines and fatigue, but still progressed to OOB to chair.    Follow Up Recommendations Home health PT;Supervision/Assistance - 24 hour    Equipment Recommendations  Other (comment) (TBA)    Recommendations for Other Services       Precautions / Restrictions Precautions Precautions: Fall Precaution Comments: rectal tube      Mobility  Bed Mobility Overal bed mobility: Needs Assistance Bed Mobility: Rolling;Sidelying to Sit Rolling: Mod assist Sidelying to sit: Mod assist       General bed mobility comments: pt attempted to roll, but scooted and reached for rail, so assisted to bring hips over and encouraged use of rail, assisted trunk upright, pt initially posterior mod support for balance  Transfers Overall transfer level: Needs assistance Equipment used: Rolling walker (2 wheeled) Transfers: Sit to/from [facility] Sit to Stand: Mod assist Stand pivot transfers: Mod assist       General transfer comment: stood from bed increased time mod support under hips and cues for LE use; stand pivot with RW mod support for balance, safety and walker management and assisted to lower hips into chair  Ambulation/Gait             General Gait Details: deferred due to lines, stool leaking around rectal tube (RN assist with hygiene)  Stairs             Wheelchair Mobility    Modified Rankin (Stroke Patients Only)       Balance Overall balance assessment: Needs assistance   Sitting balance-Leahy Scale: Poor Sitting balance - Comments: initially leaning posterior, after sitting with support, able to lean forward and sit with supervision Postural control: Posterior lean Standing balance support: Bilateral upper extremity supported Standing balance-Leahy Scale: Poor Standing balance comment: UE support and min assist needed for balance in standing                             Pertinent Vitals/Pain Pain Assessment: Faces Faces Pain Scale: Hurts little more Pain Location: abdomen Pain Descriptors / Indicators: Sore Pain Intervention(s): Monitored during session    Home Living Family/patient expects to be discharged to:: Private residence Living Arrangements: Spouse/significant other Available Help at Discharge: Family;Available PRN/intermittently Type of Home: House Home Access: Stairs to enter Entrance Stairs-Rails: Right Entrance Stairs-Number of Steps: 4 Home Layout: One level Home Equipment: Walker - 2 wheels;Bedside commode Additional Comments: not sure if their walker is rolling or standard    Prior Function Level of Independence: Independent               Hand Dominance        Extremity/Trunk Assessment               Lower Extremity Assessment: RLE deficits/detail;LLE deficits/detail RLE Deficits / Details: AROM grossly limited due to abominal soreness and knee OA, but WFL, strength hip flexion  at least 3/5 (NT due to abdominal pain), knee extension 4/5, ankle DF 4+/5 LLE Deficits / Details: AROM grossly limited due to abominal soreness and knee OA, but WFL, strength hip flexion at least 3/5 (NT due to abdominal pain), knee extension 4/5, ankle DF 4+/5     Communication   Communication: No difficulties  Cognition Arousal/Alertness: Awake/alert Behavior During Therapy:  Anxious Overall Cognitive Status: Within Functional Limits for tasks assessed                      General Comments      Exercises        Assessment/Plan    PT Assessment Patient needs continued PT services  PT Diagnosis Generalized weakness;Difficulty walking   PT Problem List Decreased strength;Decreased activity tolerance;Decreased balance;Decreased mobility;Decreased knowledge of use of DME;Pain  PT Treatment Interventions DME instruction;Balance training;Gait training;Stair training;Functional mobility training;Patient/family education;Therapeutic activities;Therapeutic exercise   PT Goals (Current goals can be found in the Care Plan section) Acute Rehab PT Goals Patient Stated Goal: To return to independent PT Goal Formulation: With patient Time For Goal Achievement: 10/06/14 Potential to Achieve Goals: Good    Frequency Min 3X/week   Barriers to discharge        Co-evaluation               End of Session Equipment Utilized During Treatment: Oxygen Activity Tolerance: Patient limited by fatigue Patient left: in chair;with call bell/phone within reach;with nursing/sitter in room           Time: 1535-1600 PT Time Calculation (min) (ACUTE ONLY): 25 min   Charges:   PT Evaluation $Initial PT Evaluation Tier I: 1 Procedure PT Treatments $Therapeutic Activity: 8-22 mins   PT G CodesMagda Kiel 10-18-14, 4:21 PM  Magda Kiel, [place] 10-18-14

## 2014-09-22 NOTE — Progress Notes (Signed)
[place] Progress Note Patient Name: Alexis Olson DOB: 29-Jan-1934 MRN: [id number]   Date of Service  09/22/2014  HPI/Events of Note  H/o asthma Wheezing - not relieved by neb/ chest PT  eICU Interventions  Pulmicort/brovana Solumedrol 40 q 24h  pCXR - dc IVFs , lasix if appears wet     Intervention Category Intermediate Interventions: Bronchospasm - evaluation and treatment  ALVA,RAKESH V. 09/22/2014, 2:50 AM

## 2014-09-22 NOTE — Progress Notes (Signed)
UR COMPLETED  

## 2014-09-22 NOTE — Progress Notes (Signed)
Patient ID: Alexis Olson, female   DOB: 1933/04/10, 81 y.o.   MRN: [id number] 4 Days Post-Op  Subjective: Pt fairly tired or sedated this morning, difficult to tell which.  She has been up most of the night complaining of SOB.  Apparently [facility] was called and she was started on IV steroids as well as bronchodilators.  She also had urinary retention and a foley was placed.  She did start having diarrhea over night as well.  No nausea.  Objective: Vital signs in last 24 hours: Temp:  [98.3 F (36.8 C)-99.8 F (37.7 C)] 98.6 F (37 C) (06/28 0400) Pulse Rate:  [102-118] 108 (06/28 0400) Resp:  [17-27] 22 (06/28 0400) BP: (123-146)/(55-91) 146/67 mmHg (06/28 0400) SpO2:  [93 %-100 %] 95 % (06/28 0747)    Intake/Output from previous day: 06/27 0701 - 06/28 0700 In: 3369.6 [P.O.:280; I.V.:2489.6; IV Piggyback:600] Out: 2200 [Urine:2200] Intake/Output this shift:    PE: Abd: soft, appropriately tender, midline wound is clean, but does have some duskiness around the edges, some BS, ND Heart: regular but slightly tachy Lungs: bilateral wheeze noted.  Work of breathing is slightly increased  Lab Results:   Recent Labs  09/20/14 2103 09/21/14 0021  WBC 13.4* 12.6*  HGB 10.8* 10.8*  HCT 33.7* 33.3*  PLT 194 193   BMET  Recent Labs  09/21/14 0021 09/21/14 0039  NA 139 139  K 2.9* 2.9*  CL 105 105  CO2 29 29  GLUCOSE 128* 128*  BUN <5* <5*  CREATININE 0.46 0.46  CALCIUM 8.3* 8.3*   PT/INR No results for input(s): LABPROT, INR in the last 72 hours. CMP     Component Value Date/Time   NA 139 09/21/2014 0039   K 2.9* 09/21/2014 0039   CL 105 09/21/2014 0039   CO2 29 09/21/2014 0039   GLUCOSE 128* 09/21/2014 0039   BUN <5* 09/21/2014 0039   CREATININE 0.46 09/21/2014 0039   CALCIUM 8.3* 09/21/2014 0039   PROT 5.4* 09/21/2014 0039   ALBUMIN 2.6* 09/21/2014 0039   AST 25 09/21/2014 0039   ALT 17 09/21/2014 0039   ALKPHOS 42 09/21/2014 0039   BILITOT 0.3 09/21/2014  0039   GFRNONAA >60 09/21/2014 0039   GFRAA >60 09/21/2014 0039   Lipase  No results found for: LIPASE     Studies/Results: Dg Chest Port 1 View  09/22/2014   CLINICAL DATA:  Dyspnea  EXAM: PORTABLE CHEST - 1 VIEW  COMPARISON:  09/20/2014  FINDINGS: The nasogastric tube has been removed. The left subclavian central line extends into the SVC near the azygos vein junction. There is persistent consolidation in the left base. Right lung remains unchanged.  IMPRESSION: NG tube removal.  Unchanged left base consolidation.   Electronically Signed   By: Andreas Newport M.D.   On: 09/22/2014 04:04   Dg Chest Port 1 View  09/20/2014   CLINICAL DATA:  Subsequent evaluation postoperatively after bowel perforation  EXAM: PORTABLE CHEST - 1 VIEW  COMPARISON:  09/18/2014  FINDINGS: Stable central line allowing for differences in rotation. NG tube crosses the gastroesophageal junction with side hole just below the diaphragm.  No change in the appearance of mediastinum. Stable cardiac silhouette. Bronchitic change and mild background interstitial prominence stable. Retrocardiac left lower lobe opacity new from the prior study likely represents atelectasis although pneumonia/ pneumonitis is not excluded. There is also medial right lung base atelectasis.  IMPRESSION: Similar appearance to prior study with new left lower lobe consolidation medially.  This could reflect atelectasis. Pneumonia/pneumonitis not excluded.   Electronically Signed   By: Skipper Cliche M.D.   On: 09/20/2014 18:40    Anti-infectives: Anti-infectives    Start     Dose/Rate Route Frequency Ordered Stop   09/22/14 0600  vancomycin (VANCOCIN) 1,250 mg in sodium chloride 0.9 % 250 mL IVPB  Status:  Discontinued     1,250 mg 166.7 mL/hr over 90 Minutes Intravenous Every 24 hours 09/21/14 0211 09/21/14 1222   09/21/14 1800  vancomycin (VANCOCIN) 1,250 mg in sodium chloride 0.9 % 250 mL IVPB     1,250 mg 166.7 mL/hr over 90 Minutes  Intravenous Every 12 hours 09/21/14 1222     09/21/14 0300  vancomycin (VANCOCIN) 1,500 mg in sodium chloride 0.9 % 500 mL IVPB     1,500 mg 250 mL/hr over 120 Minutes Intravenous  Once 09/21/14 0211 09/21/14 0504   09/21/14 0000  ertapenem (INVANZ) 1 g in sodium chloride 0.9 % 50 mL IVPB     1 g 100 mL/hr over 30 Minutes Intravenous Every 24 hours 09/20/14 2347     09/20/14 2200  piperacillin-tazobactam (ZOSYN) IVPB 3.375 g  Status:  Discontinued     3.375 g 100 mL/hr over 30 Minutes Intravenous 3 times per day 09/20/14 1805 09/20/14 1811   09/18/14 2300  cefOXitin (MEFOXIN) 2 g in dextrose 5 % 50 mL IVPB  Status:  Discontinued     2 g 100 mL/hr over 30 Minutes Intravenous 3 times per day 09/18/14 1540 09/20/14 1938   09/18/14 1445  cefTRIAXone (ROCEPHIN) 1 g in dextrose 5 % 50 mL IVPB     1 g 100 mL/hr over 30 Minutes Intravenous  Once 09/18/14 1435 09/18/14 1514   09/18/14 1445  metroNIDAZOLE (FLAGYL) IVPB 500 mg     500 mg 100 mL/hr over 60 Minutes Intravenous  Once 09/18/14 1435 09/18/14 1617       Assessment/Plan  POD 3, s/p ex lap with repair of cecal perforation -patient having some bowel function, will increase diet to full liquids -mobilize and pulm toilet -PT consult written for yesterday -Invanz D2 -foley replaced due to urinary retention Hypokalemia -recheck labs today, replace K -check labs in am PNA/Asthma -pt overnight started on daily steroids for increased respiratory distress overnight -cont vanc for PNA -still with some wheezing.  Already on a fair amount of nebs -she was taken off of her IVFs overnight as well -will ask medicine to look at her today and help [place] manage her respiratory status DVT prophylaxis -scds/lovenox Troponins -elevated troponin overnight and with some new EKG changes.  Stat troponins and EKGs being obtained right now.  Dr. Tana Coast at bedside with me.  Appreciate her assistance and help with this patient.  LOS: 4 days    OSBORNE,KELLY  E 09/22/2014, 8:05 AM Pager: [phone]

## 2014-09-22 NOTE — Progress Notes (Signed)
CRITICAL VALUE ALERT  Critical value received:  Potassium 2.4  Date of notification:  09/22/2014  Time of notification:  0926  Critical value read back:Yes.    Nurse who received alert:  Allegra Lai  MD notified (1st page):  Ferdinand Cava, PA with [facility]  Time of first page:  0940  MD notified (2nd page):  Time of second page:  Responding MD:  Ferdinand Cava, PA  Time MD responded:  [phone]

## 2014-09-22 NOTE — Consult Note (Signed)
Medical Consultation   Alexis Olson  XIP:[id number]  DOB: 1933-08-16  DOA: 09/18/2014  PCP: Gwendolyn Grant, MD  Requesting physician: Dr Georgette Dover  Reason for consultation: Dyspnea with wheezing   History of Present Illness: Patient is a 81 year old female with asthma, jejunal bypass around the age of 54, cholecystectomy had presented from Dr. Nichola Sizer office after a colonoscopy with worsening abdominal pain. Patient had abdominal x-ray which showed significant pneumoperitoneum. Patient had urgent exploratory laparotomy with repair of cecal perforation on 6/25.  On 6/27-6/28, patient was noted to be very dyspneic and wheezing. [facility] CCM was consulted and patient was started on IV steroids, Pulmicort, broncho-dilators. This morning, patient was noticed to be more sedated, still dyspneic and wheezing hence [facility] hospitalist service was requested for consultation. On my exam, patient is oriented x3, confirmed with daughter and husband at the bedside. Wheezing has significantly improved after bronchodilatation spheroids overnight. Patient denies any chest pain, palpitations, diaphoresis, shortness of breath at this time. Overnight had urinary retention issues, Foley catheter was placed. She also had 3 episodes of diarrhea, per daughter is normal for her.  Labs overnight showed potassium of 2.9, troponin of 0.12, WBC of 12.6, chest x-ray showed left base consolidation.    Allergies:   Allergies  Allergen Reactions  . Other Anaphylaxis    Red peppers--choking  . Azithromycin Other (See Comments)    Can't move joints  . Benadryl [Diphenhydramine Hcl] Other (See Comments)    Paradoxical response.  . Ciprofloxacin Hcl     REACTION: causes yeast inf  and refuses to take  . Codeine Phosphate     REACTION: unspecified  . Hydrocodone Itching and Nausea And Vomiting  . Hydrocodone-Homatropine Itching and Nausea And Vomiting    REACTION: unspecified  . Levaquin [Levofloxacin In D5w] Other (See  Comments)    Muscle soreness  . Meperidine Hcl   . Sulfa Antibiotics Other (See Comments)    Joint pain  . Ultram [Tramadol Hcl] Other (See Comments)    Can't move joints      Past Medical History  Diagnosis Date  . Obesity   . Lumbosacral disc disease     Chronic pain; History of osteomyelitis 2010 following ESI complication  . ALLERGIC RHINITIS   . 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 ANXIETY   . ASTHMA   . Bariatric surgery status   . COLONIC POLYPS, ADENOMATOUS, HX OF 2010  . DIVERTICULOSIS, COLON   . INSOMNIA   . URINARY URGENCY   . VITAMIN B12 DEFICIENCY   . Hypotension     pt denies  . Hypertension     Past Surgical History  Procedure Laterality Date  . Cholecystectomy    . Tonsillectomy    . Ileojejunal bypass  1976    for obesity  . Back surgery  1994    for ruptured disc  . Laparotomy N/A 09/18/2014    Procedure: EXPLORATORY LAPAROTOMY WITH REPAIR OF CECIAL PERFORATION;  Surgeon: Autumn Messing III, MD;  Location: [place];  Service: General;  Laterality: N/A;    Social History:  reports that she has quit smoking. She has never used smokeless tobacco. She reports that she does not drink alcohol or use illicit drugs.  Family History  Problem Relation Age of Onset  . Cervical cancer Mother   . Liver disease Sister   . Kidney disease Sister   . Cirrhosis Sister   . Colon cancer Neg Hx   .  Esophageal cancer Neg Hx   . Rectal cancer Neg Hx   . Stomach cancer Neg Hx     Review of Systems:   Constitutional: Denies fever, chills, diaphoresis, appetite change and +fatigue.  HEENT: Denies photophobia, eye pain, redness, hearing loss, ear pain, congestion, sore throat, rhinorrhea, sneezing, mouth sores, trouble swallowing, neck pain, neck stiffness and tinnitus.   Respiratory: Please see history of present illness  Cardiovascular: Denies chest pain, palpitations and leg swelling.  Gastrointestinal: Denies nausea,  vomiting, constipation, blood in stool and abdominal distention. + diarrhea Genitourinary: Denies dysuria, urgency, frequency, hematuria, flank pain and difficulty urinating.  Musculoskeletal: Denies myalgias, back pain, joint swelling, arthralgias and gait problem.  Skin: Denies pallor, rash and wound.  Neurological: Denies dizziness, seizures, syncope, weakness, light-headedness, numbness and headaches.  Hematological: Denies adenopathy. Easy bruising, personal or family bleeding history  Psychiatric/Behavioral: Denies suicidal ideation, mood changes, confusion, nervousness, sleep disturbance and agitation   Physical Exam: Blood pressure 146/67, pulse 108, temperature 98.9 F (37.2 C), temperature source Axillary, resp. rate 22, height 5\' 5"  (1.651 m), weight 95.3 kg (210 lb 1.6 oz), SpO2 95 %.  General: awake, oriented x3, not in any acute distress. HEENT: normocephalic, atraumatic, anicteric sclera, pupils reactive to light and accommodation, EOMI, oropharynx clear CVS: S1-S2 clear, no murmur rubs or gallops Chest: Mild scattered wheezing bilaterally  Abdomen: soft, tender with dressing intact  Extremities: no cyanosis, clubbing, 1+ edema,L>R Neuro: Cranial nerves II-XII intact, no focal neurological deficits Psych: alert and oriented, stable mood and affect Skin: no rashes or lesions  Labs on Admission:  Basic Metabolic Panel:  Recent Labs Lab 09/21/14 0021 09/21/14 0039  NA 139 139  K 2.9* 2.9*  CL 105 105  CO2 29 29  GLUCOSE 128* 128*  BUN <5* <5*  CREATININE 0.46 0.46  CALCIUM 8.3* 8.3*   Liver Function Tests:  Recent Labs Lab 09/21/14 0021 09/21/14 0039  AST 25 25  ALT 17 17  ALKPHOS 42 42  BILITOT 0.3 0.3  PROT 5.4* 5.4*  ALBUMIN 2.6* 2.6*   No results for input(s): LIPASE, AMYLASE in the last 168 hours. No results for input(s): AMMONIA in the last 168 hours. CBC:  Recent Labs Lab 09/20/14 2103 09/21/14 0021  WBC 13.4* 12.6*  NEUTROABS  --  9.7*   HGB 10.8* 10.8*  HCT 33.7* 33.3*  MCV 97.7 99.1  PLT 194 193   Cardiac Enzymes:  Recent Labs Lab 09/21/14 0039  TROPONINI 0.12*   BNP: Invalid input(s): POCBNP CBG: No results for input(s): GLUCAP in the last 168 hours.  Inpatient Medications:   Scheduled Meds: . arformoterol  15 mcg Nebulization BID  . budesonide  0.5 mg Nebulization BID  . enoxaparin (LOVENOX) injection  30 mg Subcutaneous Q12H  . ertapenem  1 g Intravenous Q24H  . levalbuterol  1.25 mg Nebulization Q4H  . methylPREDNISolone (SOLU-MEDROL) injection  40 mg Intravenous Daily  . potassium chloride  40 mEq Oral BID  . vancomycin  1,250 mg Intravenous Q12H   Continuous Infusions:    Radiological Exams on Admission: Dg Chest Port 1 View  09/22/2014   CLINICAL DATA:  Dyspnea  EXAM: PORTABLE CHEST - 1 VIEW  COMPARISON:  09/20/2014  FINDINGS: The nasogastric tube has been removed. The left subclavian central line extends into the SVC near the azygos vein junction. There is persistent consolidation in the left base. Right lung remains unchanged.  IMPRESSION: NG tube removal.  Unchanged left base consolidation.   Electronically Signed  By: Andreas Newport M.D.   On: 09/22/2014 04:04   Dg Chest Port 1 View  09/20/2014   CLINICAL DATA:  Subsequent evaluation postoperatively after bowel perforation  EXAM: PORTABLE CHEST - 1 VIEW  COMPARISON:  09/18/2014  FINDINGS: Stable central line allowing for differences in rotation. NG tube crosses the gastroesophageal junction with side hole just below the diaphragm.  No change in the appearance of mediastinum. Stable cardiac silhouette. Bronchitic change and mild background interstitial prominence stable. Retrocardiac left lower lobe opacity new from the prior study likely represents atelectasis although pneumonia/ pneumonitis is not excluded. There is also medial right lung base atelectasis.  IMPRESSION: Similar appearance to prior study with new left lower lobe consolidation  medially. This could reflect atelectasis. Pneumonia/pneumonitis not excluded.   Electronically Signed   By: Skipper Cliche M.D.   On: 09/20/2014 18:40    Impression/Recommendations Active Problems:   Pneumoperitoneum - Per general surgery service   Acute respiratory failure with hypoxia, with underlying asthma - Patient denies any history of tobacco use - Currently O2 sats, 95% on 2 L, mild scattered wheezing with tachycardia, rule out PE, obtain CT angiogram of the chest - Chest x-ray overnight shows left base consolidation, may have atelectasis, place on incentive spirometry, pulmonary toileting, continue IV antibiotics - Continue scheduled bronchodilators, Pulmicort, IV Solu-Medrol  - Strict I's and O's, serial cardiac enzymes, first set of troponin was positive, 0.12 last night with EKG changes, repeat EKG - Minimize sedation and narcotics as tolerated - IV fluids off today, agree with clear liquid diet per surgery  Hypokalemia - Repeat potassium/BMET, add magnesium - Keep potassium above 4 and magnesium above 2   Elevated troponin - No chest pain at this time, obtain serial cardiac enzymes, if next troponin elevated, will obtain 2-D echocardiogram and recommend cardiology evaluation  Dairrhea - If continues to worsen, check C. difficile  [facility] medicine service will follow the patient, thank you for this consultation.  Time Spent : 60 mins   RAI,RIPUDEEP M.D. [facility] 09/22/2014, 8:41 AM

## 2014-09-22 NOTE — Progress Notes (Signed)
Flexiseal leaking. 10cc of fluid added for a total of 35cc in balloon.

## 2014-09-22 NOTE — Progress Notes (Signed)
Flexiseal placed per order. Allegra Lai, RN placed rectal tube with Meribeth Mattes, RN assisting. 25cc Saline was used to fill balloon.

## 2014-09-22 NOTE — Progress Notes (Signed)
Patient ID: Alexis Olson, female   DOB: 1933/07/21, 81 y.o.   MRN: [id number]    [facility] Physician Progress Note and Electrolyte Replacement  Patient Name: Alexis Olson DOB: 11/23/1933 MRN: [id number]  Date of Service  09/22/2014   HPI/Events of Note    Recent Labs Lab 09/19/14 0714 09/21/14 0021 09/21/14 0039 09/22/14 0823 09/22/14 1435  NA 138 139 139 137 139  K 3.4* 2.9* 2.9* 2.4* 2.4*  CL 109 105 105 105 107  CO2 23 29 29 27 28   GLUCOSE 119* 128* 128* 158* 161*  BUN <5* <5* <5* <5* <5*  CREATININE 0.56 0.46 0.46 0.43* 0.44  CALCIUM 7.6* 8.3* 8.3* 7.7* 7.8*  MG  --   --   --  1.8  --     Estimated Creatinine Clearance: 65.1 mL/min (by C-G formula based on Cr of 0.44).  Intake/Output      06/27 0701 - 06/28 0700 06/28 0701 - 06/29 0700   P.O. 280 840   I.V. (mL/kg) 2489.6 (26.1) 100 (1)   IV Piggyback 600 100   Total Intake(mL/kg) 3369.6 (35.4) 1040 (10.9)   Urine (mL/kg/hr) 2575 (1.1)    Emesis/NG output     Stool 0 (0)    Total Output 2575     Net +794.6 +1040        Stool Occurrence 4 x 3 x    - I/O DETAILED x 24h    Total I/O In: 2446 [P.O.:840; I.V.:100; IV Piggyback:100] Out: -  - I/O THIS SHIFT    ASSESSMENT Low K Low Mag  eICURN Interventions  KCL 105meq x6 2gm mag sulfate Check mag and phos and bmet 09/23/14    ASSESSMENT: MAJOR ELECTROLYTE      Dr. Brand Males, M.D., [facility]C.P Pulmonary and Critical Care Medicine Staff Physician [facility] Pager: [phone], If no answer or between  15:00h - 7:00h: call [phone]  09/22/2014 3:50 PM

## 2014-09-22 NOTE — Care Management (Signed)
Important Message  Patient Details  Name: Alexis Olson MRN: [id number] Date of Birth: 1933/12/21   Medicare Important Message Given:  Yes-second notification given    Nathen May 09/22/2014, 1:30 PM

## 2014-09-22 NOTE — Progress Notes (Signed)
Pt c/o needing to urinate but unable to. Pt has had multiple attempt on the bed pan with out success. Bladder scan done and pt has 517 ml in bladder. On call MD paged.  2226 Dr Redmond Pulling returned page and gave orders to place foley catheter. Pt and family updated and educated on catheter placement. Will continue to monitor pt status. 2300 71fr foley catheter placed following protocol. Will continue to monitor pt .

## 2014-09-23 ENCOUNTER — Telehealth: Payer: Self-pay | Admitting: Internal Medicine

## 2014-09-23 DIAGNOSIS — J9600 Acute respiratory failure, unspecified whether with hypoxia or hypercapnia: Secondary | ICD-10-CM

## 2014-09-23 DIAGNOSIS — I959 Hypotension, unspecified: Secondary | ICD-10-CM

## 2014-09-23 LAB — MAGNESIUM: Magnesium: 2.5 mg/dL — ABNORMAL HIGH (ref 1.7–2.4)

## 2014-09-23 LAB — BASIC METABOLIC PANEL
Anion gap: 7 (ref 5–15)
BUN: 6 mg/dL (ref 6–20)
CO2: 27 mmol/L (ref 22–32)
Calcium: 7.9 mg/dL — ABNORMAL LOW (ref 8.9–10.3)
Chloride: 107 mmol/L (ref 101–111)
Creatinine, Ser: 0.43 mg/dL — ABNORMAL LOW (ref 0.44–1.00)
GFR calc Af Amer: >60 mL/min (ref >60)
GFR calc non Af Amer: >60 mL/min (ref >60)
Glucose, Bld: 108 mg/dL — ABNORMAL HIGH (ref 65–99)
Potassium: 3.1 mmol/L — ABNORMAL LOW (ref 3.5–5.1)
Sodium: 141 mmol/L (ref 135–145)

## 2014-09-23 LAB — CBC
HCT: 29.3 % — ABNORMAL LOW (ref 36.0–46.0)
Hemoglobin: 9.7 g/dL — ABNORMAL LOW (ref 12.0–15.0)
MCH: 31.2 pg (ref 26.0–34.0)
MCHC: 33.1 g/dL (ref 30.0–36.0)
MCV: 94.2 fL (ref 78.0–100.0)
Platelets: 217 10*3/uL (ref 150–400)
RBC: 3.11 MIL/uL — ABNORMAL LOW (ref 3.87–5.11)
RDW: 14.2 % (ref 11.5–15.5)
WBC: 9.1 10*3/uL (ref 4.0–10.5)

## 2014-09-23 LAB — URINE CULTURE: Culture: NO GROWTH

## 2014-09-23 LAB — PHOSPHORUS: Phosphorus: 1.7 mg/dL — ABNORMAL LOW (ref 2.5–4.6)

## 2014-09-23 MED ORDER — BETHANECHOL CHLORIDE 5 MG PO TABS
5.0000 mg | ORAL_TABLET | Freq: Three times a day (TID) | ORAL | Status: DC
  Administered 2014-09-23 – 2014-09-26 (×8): 5 mg via ORAL
  Filled 2014-09-23 (×12): qty 1

## 2014-09-23 MED ORDER — ZOLPIDEM TARTRATE 5 MG PO TABS
5.0000 mg | ORAL_TABLET | Freq: Every evening | ORAL | Status: DC | PRN
  Administered 2014-09-23 – 2014-09-25 (×3): 5 mg via ORAL
  Filled 2014-09-23 (×3): qty 1

## 2014-09-23 MED ORDER — TAMSULOSIN HCL 0.4 MG PO CAPS
0.4000 mg | ORAL_CAPSULE | Freq: Every day | ORAL | Status: DC
  Filled 2014-09-23: qty 1

## 2014-09-23 MED ORDER — DIPHENOXYLATE-ATROPINE 2.5-0.025 MG PO TABS
2.0000 | ORAL_TABLET | Freq: Four times a day (QID) | ORAL | Status: DC
  Administered 2014-09-23 – 2014-09-24 (×5): 2 via ORAL
  Administered 2014-09-24: 1 via ORAL
  Administered 2014-09-25 – 2014-09-26 (×6): 2 via ORAL
  Filled 2014-09-23 (×13): qty 2

## 2014-09-23 MED ORDER — PREDNISONE 20 MG PO TABS
20.0000 mg | ORAL_TABLET | Freq: Every day | ORAL | Status: AC
  Administered 2014-09-25 – 2014-09-26 (×2): 20 mg via ORAL
  Filled 2014-09-23 (×4): qty 1

## 2014-09-23 MED ORDER — PREDNISONE 10 MG PO TABS
10.0000 mg | ORAL_TABLET | Freq: Every day | ORAL | Status: DC
  Administered 2014-09-26: 20 mg via ORAL

## 2014-09-23 MED ORDER — PANTOPRAZOLE SODIUM 40 MG PO TBEC
40.0000 mg | DELAYED_RELEASE_TABLET | Freq: Every day | ORAL | Status: DC
  Administered 2014-09-23 – 2014-09-26 (×4): 40 mg via ORAL
  Filled 2014-09-23 (×5): qty 1

## 2014-09-23 MED ORDER — POTASSIUM CHLORIDE 10 MEQ/50ML IV SOLN
10.0000 meq | INTRAVENOUS | Status: AC
  Administered 2014-09-23 (×4): 10 meq via INTRAVENOUS
  Filled 2014-09-23 (×3): qty 50

## 2014-09-23 MED ORDER — PREDNISONE 20 MG PO TABS
30.0000 mg | ORAL_TABLET | Freq: Every day | ORAL | Status: AC
  Administered 2014-09-23 – 2014-09-24 (×2): 30 mg via ORAL
  Filled 2014-09-23 (×2): qty 1

## 2014-09-23 MED ORDER — AMITRIPTYLINE HCL 50 MG PO TABS
50.0000 mg | ORAL_TABLET | Freq: Every day | ORAL | Status: DC
  Administered 2014-09-23 – 2014-09-25 (×3): 50 mg via ORAL
  Filled 2014-09-23 (×4): qty 1

## 2014-09-23 MED ORDER — DICYCLOMINE HCL 10 MG PO CAPS
10.0000 mg | ORAL_CAPSULE | Freq: Three times a day (TID) | ORAL | Status: DC
  Administered 2014-09-23 – 2014-09-26 (×11): 10 mg via ORAL
  Filled 2014-09-23 (×13): qty 1

## 2014-09-23 MED ORDER — POTASSIUM CHLORIDE CRYS ER 20 MEQ PO TBCR
40.0000 meq | EXTENDED_RELEASE_TABLET | Freq: Two times a day (BID) | ORAL | Status: AC
  Administered 2014-09-23 (×2): 40 meq via ORAL
  Filled 2014-09-23 (×2): qty 2

## 2014-09-23 NOTE — Telephone Encounter (Signed)
Patient's daughter is calling because patient is having difficultly urinating after having a foley. She states her mother is asking for consult by Dr. Gaynelle Arabian her urologist. She states Dr. Olevia Perches said to call her for any needs while she is inpatient. Please, advise.

## 2014-09-23 NOTE — Progress Notes (Signed)
Pathology report from a cecal polyp removed during colonoscopy shows serrated adenoma with cylologic dyplasia, no high grade dysplasia. The postpolypectomy site was excised entirely during subsequent  Expl. Lap by Dr Marlou Starks,    Edwyna Perfect, [place] ,GI [phone]

## 2014-09-23 NOTE — Progress Notes (Signed)
Patient ID: Alexis Olson, female   DOB: Jul 20, 1933, 81 y.o.   MRN: [id number] 5 Days Post-Op  Subjective: Pt more awake and alert today.  Tolerating full liquids.  flexiseal in place with liquid stool.  c diff negative.  Mobilized yesterday  Objective: Vital signs in last 24 hours: Temp:  [97.9 F (36.6 C)-99.1 F (37.3 C)] 98.6 F (37 C) (06/29 0733) Pulse Rate:  [95-104] 95 (06/29 0439) Resp:  [18-22] 18 (06/29 0439) BP: (122-128)/(60-68) 128/68 mmHg (06/29 0439) SpO2:  [93 %-98 %] 95 % (06/29 0733) Last BM Date: 09/22/14  Intake/Output from previous day: 06/28 0701 - 06/29 0700 In: 2340 [P.O.:1440; I.V.:100; IV Piggyback:800] Out: 2150 [Urine:2050; Stool:100] Intake/Output this shift:    PE: Abd: soft, appropriately tender, midline wound is clean and packed, +BS, ND Heart: regular Lungs: clear, but decreased at the bases  Lab Results:   Recent Labs  09/22/14 0823 09/23/14 0250  WBC 9.3 9.1  HGB 10.6* 9.7*  HCT 32.7* 29.3*  PLT 204 217   BMET  Recent Labs  09/22/14 1435 09/23/14 0230  NA 139 141  K 2.4* 3.1*  CL 107 107  CO2 28 27  GLUCOSE 161* 108*  BUN <5* 6  CREATININE 0.44 0.43*  CALCIUM 7.8* 7.9*   PT/INR No results for input(s): LABPROT, INR in the last 72 hours. CMP     Component Value Date/Time   NA 141 09/23/2014 0230   K 3.1* 09/23/2014 0230   CL 107 09/23/2014 0230   CO2 27 09/23/2014 0230   GLUCOSE 108* 09/23/2014 0230   BUN 6 09/23/2014 0230   CREATININE 0.43* 09/23/2014 0230   CALCIUM 7.9* 09/23/2014 0230   PROT 5.4* 09/21/2014 0039   ALBUMIN 2.6* 09/21/2014 0039   AST 25 09/21/2014 0039   ALT 17 09/21/2014 0039   ALKPHOS 42 09/21/2014 0039   BILITOT 0.3 09/21/2014 0039   GFRNONAA >60 09/23/2014 0230   GFRAA >60 09/23/2014 0230   Lipase  No results found for: LIPASE     Studies/Results: Ct Angio Chest Pe W/cm &/or Wo Cm  09/22/2014   CLINICAL DATA:  Shortness of Breath  EXAM: CT ANGIOGRAPHY CHEST WITH CONTRAST   TECHNIQUE: Multidetector CT imaging of the chest was performed using the standard protocol during bolus administration of intravenous contrast. Multiplanar CT image reconstructions and MIPs were obtained to evaluate the vascular anatomy.  CONTRAST:  124mL OMNIPAQUE IOHEXOL 350 MG/ML SOLN  COMPARISON:  09/22/2014  FINDINGS: Small left pleural effusion is noted. Bibasilar atelectatic changes are noted in the lower lobes. No focal nodular change is seen.  The thoracic inlet is within normal limits. The thoracic aorta and its branches are unremarkable. The pulmonary artery demonstrates a normal branching pattern without definitive filling defect to suggest pulmonary embolism. No hilar or mediastinal adenopathy is noted. Mild coronary calcifications are seen.  Visualized upper abdomen shows a cystic lesion within the right lobe of the liver unchanged from a prior exam. Some residual free air is noted within the abdomen from recent surgery.  Review of the MIP images confirms the above findings.  IMPRESSION: No evidence of pulmonary embolism.  Bilateral lower lobe atelectasis with evidence of a left-sided pleural effusion. These changes have increased slightly in the interval from the prior exam.   Electronically Signed   By: Inez Catalina M.D.   On: 09/22/2014 14:10   Dg Chest Port 1 View  09/22/2014   CLINICAL DATA:  Dyspnea  EXAM: PORTABLE CHEST - 1  VIEW  COMPARISON:  09/20/2014  FINDINGS: The nasogastric tube has been removed. The left subclavian central line extends into the SVC near the azygos vein junction. There is persistent consolidation in the left base. Right lung remains unchanged.  IMPRESSION: NG tube removal.  Unchanged left base consolidation.   Electronically Signed   By: Andreas Newport M.D.   On: 09/22/2014 04:04    Anti-infectives: Anti-infectives    Start     Dose/Rate Route Frequency Ordered Stop   09/22/14 0600  vancomycin (VANCOCIN) 1,250 mg in sodium chloride 0.9 % 250 mL IVPB  Status:   Discontinued     1,250 mg 166.7 mL/hr over 90 Minutes Intravenous Every 24 hours 09/21/14 0211 09/21/14 1222   09/21/14 1800  vancomycin (VANCOCIN) 1,250 mg in sodium chloride 0.9 % 250 mL IVPB     1,250 mg 166.7 mL/hr over 90 Minutes Intravenous Every 12 hours 09/21/14 1222     09/21/14 0300  vancomycin (VANCOCIN) 1,500 mg in sodium chloride 0.9 % 500 mL IVPB     1,500 mg 250 mL/hr over 120 Minutes Intravenous  Once 09/21/14 0211 09/21/14 0504   09/21/14 0000  ertapenem (INVANZ) 1 g in sodium chloride 0.9 % 50 mL IVPB     1 g 100 mL/hr over 30 Minutes Intravenous Every 24 hours 09/20/14 2347     09/20/14 2200  piperacillin-tazobactam (ZOSYN) IVPB 3.375 g  Status:  Discontinued     3.375 g 100 mL/hr over 30 Minutes Intravenous 3 times per day 09/20/14 1805 09/20/14 1811   09/18/14 2300  cefOXitin (MEFOXIN) 2 g in dextrose 5 % 50 mL IVPB  Status:  Discontinued     2 g 100 mL/hr over 30 Minutes Intravenous 3 times per day 09/18/14 1540 09/20/14 1938   09/18/14 1445  cefTRIAXone (ROCEPHIN) 1 g in dextrose 5 % 50 mL IVPB     1 g 100 mL/hr over 30 Minutes Intravenous  Once 09/18/14 1435 09/18/14 1514   09/18/14 1445  metroNIDAZOLE (FLAGYL) IVPB 500 mg     500 mg 100 mL/hr over 60 Minutes Intravenous  Once 09/18/14 1435 09/18/14 1617       Assessment/Plan  POD 4, s/p ex lap with repair of cecal perforation -advance to soft diet -mobilize and pulm toilet -PT recommends HHPT -Invanz D3 -foley replaced due to urinary retention, will leave in today, start flomax, voiding trial likely tomorrow -start lomotil to help with diarrhea so we can get flexi-seal out as soon as possible -cont BID dressing changes Hypokalemia -replace K.  H/o JI bypass for weight loss, so she tends to have a lot of diarrhea -check labs in am PNA/Asthma -appreciate medicine's assistance with this patient. -on vanc DVT prophylaxis -scds/lovenox Dispo: Ok to go to the floor today from surgical standpoint, if ok  with medicine   LOS: 5 days    Alexis Olson,Alexis Olson 09/23/2014, 8:09 AM Pager: [phone]

## 2014-09-23 NOTE — Consult Note (Signed)
[facility] TEAM 1 - Stepdown/ICU TEAM CONSULT F/U Note  Alexis Olson IPJ:[id number] DOB: 17-Jul-1933 DOA: 09/18/2014 PCP: Gwendolyn Grant, MD  Admit HPI / Brief Narrative: 81 year old female with asthma, jejunal bypass around the age of 74, and cholecystectomy who presented from Dr. Nichola Sizer office after a colonoscopy with worsening abdominal pain. Patient had abdominal x-ray which showed significant pneumoperitoneum. Patient had urgent exploratory laparotomy with repair of cecal perforation on 6/25.   On 6/27-6/28 patient was noted to be very dyspneic and wheezing. Elink CCM was consulted and patient was started on IV steroids, Pulmicort, broncho-dilators. 6/28 morning the patient was noticed to be more sedated, still dyspneic and wheezing.  TRH service was consulted.  HPI/Subjective: Pt was seen by PCCM today, who addressed all medical consultative issues for today.  PCCM is to sign off today.  TRH will therefore not see the pt today (to avoid redundant service), but will f/u as a medical consultant in the AM.    Assessment/Plan:  Pneumoperitoneum - Colon perforation s/p colonoscopy - Per primary service (General Surgery)  Acute respiratory failure with hypoxia - Bibasilar L>R airspace disease -Likely atelectasis but can't r/o HCAP vs aspiration -Care as prescribed by PCCM in visit today   Hypokalemia -Keep potassium above 4 and magnesium above 2  Elevated troponin -peaked at 0.12 - has now normalized   Dairrhea -If continues to worsen, check C. difficile  Code Status: FULL Family Communication:   Antibiotics: Ertapenem 6/26 > Vancomycin 6/26 >  DVT prophylaxis: Lovenox  Objective: Blood pressure 128/68, pulse 95, temperature 98.6 F (37 C), temperature source Oral, resp. rate 18, height 5\' 5"  (1.651 m), weight 95.3 kg (210 lb 1.6 oz), SpO2 95 %.  Intake/Output Summary (Last 24 hours) at 09/23/14 1115 Last data filed at 09/23/14 0700  Gross per 24 hour  Intake    1980 ml  Output   2150 ml  Net   -170 ml    Exam: No exam from [facility] service today  Data Reviewed: Basic Metabolic Panel:  Recent Labs Lab 09/21/14 0021 09/21/14 0039 09/22/14 0823 09/22/14 1435 09/23/14 0230  NA 139 139 137 139 141  K 2.9* 2.9* 2.4* 2.4* 3.1*  CL 105 105 105 107 107  CO2 29 29 27 28 27   GLUCOSE 128* 128* 158* 161* 108*  BUN <5* <5* <5* <5* 6  CREATININE 0.46 0.46 0.43* 0.44 0.43*  CALCIUM 8.3* 8.3* 7.7* 7.8* 7.9*  MG  --   --  1.8  --  2.5*  PHOS  --   --   --   --  1.7*    CBC:  Recent Labs Lab 09/18/14 1359 09/19/14 0714 09/20/14 2103 09/21/14 0021 09/22/14 0823 09/23/14 0250  WBC 5.9 14.7* 13.4* 12.6* 9.3 9.1  NEUTROABS 4.4  --   --  9.7*  --   --   HGB 12.1 11.5* 10.8* 10.8* 10.6* 9.7*  HCT 36.8 35.8* 33.7* 33.3* 32.7* 29.3*  MCV 97.1 98.6 97.7 99.1 95.3 94.2  PLT 214 207 194 193 204 217    Liver Function Tests:  Recent Labs Lab 09/21/14 0021 09/21/14 0039  AST 25 25  ALT 17 17  ALKPHOS 42 42  BILITOT 0.3 0.3  PROT 5.4* 5.4*  ALBUMIN 2.6* 2.6*   Cardiac Enzymes:  Recent Labs Lab 09/21/14 0039 09/22/14 0823 09/22/14 1435 09/22/14 2020  TROPONINI 0.12* 0.04* 0.03 <0.03    Recent Results (from the past 240 hour(s))  MRSA PCR Screening  Status: None   Collection Time: 09/18/14  9:52 PM  Result Value Ref Range Status   MRSA by PCR NEGATIVE NEGATIVE Final    Comment:        The GeneXpert MRSA Assay (FDA approved for NASAL specimens only), is one component of a comprehensive MRSA colonization surveillance program. It is not intended to diagnose MRSA infection nor to guide or monitor treatment for MRSA infections.   Culture, Urine     Status: None   Collection Time: 09/20/14  6:05 PM  Result Value Ref Range Status   Specimen Description URINE, CLEAN CATCH  Final   Special Requests NONE  Final   Culture NO GROWTH 2 DAYS  Final   Report Status 09/22/2014 FINAL  Final  Culture, blood (routine x 2)     Status:  None (Preliminary result)   Collection Time: 09/21/14  1:25 AM  Result Value Ref Range Status   Specimen Description BLOOD LEFT ARM  Final   Special Requests BOTTLES DRAWN AEROBIC ONLY 6CC  Final   Culture NO GROWTH 1 DAY  Final   Report Status PENDING  Incomplete  Culture, blood (routine x 2)     Status: None (Preliminary result)   Collection Time: 09/21/14  1:31 AM  Result Value Ref Range Status   Specimen Description BLOOD LEFT HAND  Final   Special Requests BOTTLES DRAWN AEROBIC ONLY 6CC  Final   Culture NO GROWTH 1 DAY  Final   Report Status PENDING  Incomplete  Clostridium Difficile by PCR (not at [facility])     Status: None   Collection Time: 09/22/14 10:28 AM  Result Value Ref Range Status   C difficile by pcr NEGATIVE NEGATIVE Final     Studies:   Recent x-ray studies have been reviewed in detail by the Attending Physician  Scheduled Meds:  Scheduled Meds: . amitriptyline  50 mg Oral QHS  . arformoterol  15 mcg Nebulization BID  . budesonide  0.5 mg Nebulization BID  . dicyclomine  10 mg Oral TID AC  . diphenoxylate-atropine  2 tablet Oral QID  . enoxaparin (LOVENOX) injection  30 mg Subcutaneous Q12H  . ertapenem  1 g Intravenous Q24H  . levalbuterol  1.25 mg Nebulization Q4H  . potassium chloride  40 mEq Oral BID  . predniSONE  30 mg Oral Q breakfast  . vancomycin  1,250 mg Intravenous Q12H    Time spent on care of this patient: no charge   Cherene Altes , MD   [facility] Office  [phone] Pager - Text Page per Shea Evans as per below:  On-Call/Text Page:      [email or web address]      password [email or web address]  If 7PM-7AM, please contact night-coverage [email or web address] Password [email or web address] 09/23/2014, 11:15 AM   LOS: 5 days

## 2014-09-23 NOTE — Progress Notes (Signed)
PULMONARY  / CRITICAL CARE MEDICINE CONSULTATION   Name: Alexis Olson MRN: [id number] DOB: 10-Aug-1933    ADMISSION DATE:  09/18/2014 CONSULTATION DATE: September 23, 2014  REQUESTING CLINICIAN: Md Ccs, MD PRIMARY SERVICE: CCS  CHIEF COMPLAINT:  Agitation  BRIEF PATIENT DESCRIPTION:  81 y/o woman with colonic perforation from colonoscopy s/p Ex Lap 6/25. PCCM asked to see 6/27 for delirium, SIRS and increased WOB.    SIGNIFICANT EVENTS / STUDIES:  Progressive mental status changes 6/26  LINES / TUBES: CVC - L Oden - 6/24 -->>  CULTURES: Blood cx 6/27 -- pending  ANTIBIOTICS: Ertapnem 6/27--> Vancomycin 6/27--> Cefoxitin 6/24 --> 6/26  SUBJECTIVE:  Feeling better   VITAL SIGNS: Temp:  [97.9 F (36.6 C)-99.1 F (37.3 C)] 98.6 F (37 C) (06/29 0733) Pulse Rate:  [95-104] 95 (06/29 0439) Resp:  [18-22] 18 (06/29 0439) BP: (122-128)/(60-68) 128/68 mmHg (06/29 0439) SpO2:  [93 %-98 %] 95 % (06/29 0733) 3 liters HEMODYNAMICS:   VENTILATOR SETTINGS:   INTAKE / OUTPUT: Intake/Output      06/28 0701 - 06/29 0700 06/29 0701 - 06/30 0700   P.O. 1440    I.V. (mL/kg) 100 (1)    IV Piggyback 800    Total Intake(mL/kg) 2340 (24.6)    Urine (mL/kg/hr) 2050 (0.9)    Stool 100 (0)    Total Output 2150     Net +190          Stool Occurrence 4 x      PHYSICAL EXAMINATION: General:  Elderly woman lying in bed, awake, now in no distress Neuro:  Awake, fully oriented  HEENT:  MMM Neck: No JVD Cardiovascular:  rrr Lungs: clear, decreased in bases  Abdomen:  Hyperactive bowel sounds heard. Musculoskeletal:  No deformities Skin:  No rashes  LABS:  CBC  Recent Labs Lab 09/21/14 0021 09/22/14 0823 09/23/14 0250  WBC 12.6* 9.3 9.1  HGB 10.8* 10.6* 9.7*  HCT 33.3* 32.7* 29.3*  PLT 193 204 217   Coag's No results for input(s): APTT, INR in the last 168 hours. BMET  Recent Labs Lab 09/22/14 0823 09/22/14 1435 09/23/14 0230  NA 137 139 141  K 2.4* 2.4* 3.1*   CL 105 107 107  CO2 27 28 27   BUN <5* <5* 6  CREATININE 0.43* 0.44 0.43*  GLUCOSE 158* 161* 108*   Electrolytes  Recent Labs Lab 09/22/14 0823 09/22/14 1435 09/23/14 0230  CALCIUM 7.7* 7.8* 7.9*  MG 1.8  --  2.5*  PHOS  --   --  1.7*   Sepsis Markers  Recent Labs Lab 09/21/14 0025  LATICACIDVEN 1.7   ABG  Recent Labs Lab 09/20/14 2234 09/22/14 1120  PHART 7.484* 7.484*  PCO2ART 36.4 31.9*  PO2ART 63.2* 65.5*   Liver Enzymes  Recent Labs Lab 09/21/14 0021 09/21/14 0039  AST 25 25  ALT 17 17  ALKPHOS 42 42  BILITOT 0.3 0.3  ALBUMIN 2.6* 2.6*   Cardiac Enzymes  Recent Labs Lab 09/22/14 0823 09/22/14 1435 09/22/14 2020  TROPONINI 0.04* 0.03 <0.03   Glucose No results for input(s): GLUCAP in the last 168 hours.  Imaging Ct Angio Chest Pe W/cm &/or Wo Cm  09/22/2014   CLINICAL DATA:  Shortness of Breath  EXAM: CT ANGIOGRAPHY CHEST WITH CONTRAST  TECHNIQUE: Multidetector CT imaging of the chest was performed using the standard protocol during bolus administration of intravenous contrast. Multiplanar CT image reconstructions and MIPs were obtained to evaluate the vascular anatomy.  CONTRAST:  146mL OMNIPAQUE IOHEXOL 350 MG/ML SOLN  COMPARISON:  09/22/2014  FINDINGS: Small left pleural effusion is noted. Bibasilar atelectatic changes are noted in the lower lobes. No focal nodular change is seen.  The thoracic inlet is within normal limits. The thoracic aorta and its branches are unremarkable. The pulmonary artery demonstrates a normal branching pattern without definitive filling defect to suggest pulmonary embolism. No hilar or mediastinal adenopathy is noted. Mild coronary calcifications are seen.  Visualized upper abdomen shows a cystic lesion within the right lobe of the liver unchanged from a prior exam. Some residual free air is noted within the abdomen from recent surgery.  Review of the MIP images confirms the above findings.  IMPRESSION: No evidence of  pulmonary embolism.  Bilateral lower lobe atelectasis with evidence of a left-sided pleural effusion. These changes have increased slightly in the interval from the prior exam.   Electronically Signed   By: Inez Catalina M.D.   On: 09/22/2014 14:10   Dg Chest Port 1 View  09/22/2014   CLINICAL DATA:  Dyspnea  EXAM: PORTABLE CHEST - 1 VIEW  COMPARISON:  09/20/2014  FINDINGS: The nasogastric tube has been removed. The left subclavian central line extends into the SVC near the azygos vein junction. There is persistent consolidation in the left base. Right lung remains unchanged.  IMPRESSION: NG tube removal.  Unchanged left base consolidation.   Electronically Signed   By: Andreas Newport M.D.   On: 09/22/2014 04:04    EKG: sinus tach, unchanged from prior. CXR: bilateral patchy infiltrates,  L>R ASSESSMENT / PLAN:  Mild hypoxia -->improving Bibasilar L>R airspace disease. Likely atelectasis but can't r/o HCAP vs aspiration Asthmatic exacerbation  >no wheeze  Plan:   Continue supportive care, broad-spectrum antibiotics. Taper steroids over next 5d to off  Pulmonary toilet Early mobility Scheduled albuterol   Colonic perf s/p open repair in setting of intra-abd sepsis  Ileus Plan:   Per primary team Slow adv diet  Cont vanc and invanz started 7/27>>>  Hypokalemia  Plan Replace and recheck   Insomnia  Plan Resume Lorrin Mais also add back 1/2 dose of her amitriptyline  Decrease steroids should also help    Looks great, no distress. Taper steroids, f/u cultures, narrow abx as able. OK for med/surg. PCCM s/o.  Erick Colace ACNP-BC [place] Pager # [phone] OR # [phone] if no answer  PCCM ATTENDING: I have reviewed pt's initial presentation, consultants notes and hospital database in detail.  The above assessment and plan was formulated under my direction. I have ordered a prednisone taper. Otherwise, will leave rest of mgmt to CCS and Eagar will sign  off. Please call if we can be of further assistance   Merton Border, MD;  PCCM service; Mobile [phone]

## 2014-09-23 NOTE — Telephone Encounter (Signed)
I have spoken to the nurse taking carwe of [employer], pt has a Foley catheter reinserted.  Pt 's urologist Dr Hartley Barefoot in case consult needed.

## 2014-09-23 NOTE — Telephone Encounter (Signed)
Patient's daughter notified.

## 2014-09-23 NOTE — Progress Notes (Signed)
Physical Therapy Treatment Patient Details Name: Alexis Olson MRN: [id number] DOB: 23-Jan-1934 Today's Date: 09/23/2014    History of Present Illness Patient is a 81 y/o female s/p exp lap on 6/25 following colonoscopy with colonic perforation.  PMH positive for lumbar disc surgery, bariatric surgery, anemia, anxiety and asthma.    PT Comments    Patient progressing to ambulation this session.  Seems fatigued due to lack of sleep due to meds per patient.  Will benefit from continued skilled PT to progress mobility for decreased burden of care on family at home.  Follow Up Recommendations  Home health PT;Supervision/Assistance - 24 hour     Equipment Recommendations  Other (comment) (TBA)    Recommendations for Other Services       Precautions / Restrictions Precautions Precautions: Fall Precaution Comments: rectal tube    Mobility  Bed Mobility Overal bed mobility: Needs Assistance Bed Mobility: Rolling;Sidelying to Sit Rolling: Mod assist Sidelying to sit: Max assist;Mod assist       General bed mobility comments: increased time, use of rail to roll and  assist for LE's off bed and to lift trunk upright  Transfers Overall transfer level: Needs assistance Equipment used: Rolling walker (2 wheeled) Transfers: Sit to/from Stand Sit to Stand: Mod assist;+2 physical assistance         General transfer comment: lifting assist to stand, difficult due to poor tolerance to anterior lean with abdominal sorenss  Ambulation/Gait Ambulation/Gait assistance: Mod assist;+2 physical assistance Ambulation Distance (Feet): 10 Feet   Gait Pattern/deviations: Step-to pattern;Antalgic;Decreased stride length     General Gait Details: ambulated to door with RW mod assist, initially left knee pain with weight bearing, seemed to improve with increased gait   Stairs            Wheelchair Mobility    Modified Rankin (Stroke Patients Only)       Balance Overall balance  assessment: Needs assistance Sitting-balance support: Bilateral upper extremity supported Sitting balance-Leahy Scale: Fair Sitting balance - Comments: sitting better onces scooted to edge, initially leaning back on hands   Standing balance support: Bilateral upper extremity supported Standing balance-Leahy Scale: Poor Standing balance comment: needs UE support for balance                    Cognition Arousal/Alertness: Awake/alert Behavior During Therapy: WFL for tasks assessed/performed Overall Cognitive Status: Within Functional Limits for tasks assessed                      Exercises      General Comments General comments (skin integrity, edema, etc.): Patient and grandson agreed to plans for HHPT feel pt's spouse and son can assist with care at home.      Pertinent Vitals/Pain Faces Pain Scale: Hurts little more Pain Location: abdomen and generalized Pain Descriptors / Indicators: Sore;Aching Pain Intervention(s): Limited activity within patient's tolerance;Monitored during session    Home Living                      Prior Function            PT Goals (current goals can now be found in the care plan section) Progress towards PT goals: Progressing toward goals    Frequency  Min 3X/week    PT Plan Current plan remains appropriate    Co-evaluation             End of Session   Activity Tolerance:  Patient limited by fatigue Patient left: in chair;with call bell/phone within reach;with family/visitor present     Time: 1420-1445 PT Time Calculation (min) (ACUTE ONLY): 25 min  Charges:  $Gait Training: 8-22 mins $Therapeutic Activity: 8-22 mins                    G Codes:      Alexis Olson,Alexis Olson 29-Sep-2014, 3:01 PM  Magda Kiel, [place] 2014/09/29

## 2014-09-23 NOTE — Telephone Encounter (Signed)
Unable to reach patient's daughter due to mailbox being full will try again later.

## 2014-09-24 DIAGNOSIS — J9601 Acute respiratory failure with hypoxia: Secondary | ICD-10-CM | POA: Diagnosis present

## 2014-09-24 DIAGNOSIS — R778 Other specified abnormalities of plasma proteins: Secondary | ICD-10-CM | POA: Diagnosis present

## 2014-09-24 DIAGNOSIS — R0600 Dyspnea, unspecified: Secondary | ICD-10-CM

## 2014-09-24 DIAGNOSIS — R7989 Other specified abnormal findings of blood chemistry: Secondary | ICD-10-CM

## 2014-09-24 DIAGNOSIS — K529 Noninfective gastroenteritis and colitis, unspecified: Secondary | ICD-10-CM | POA: Diagnosis present

## 2014-09-24 DIAGNOSIS — E876 Hypokalemia: Secondary | ICD-10-CM

## 2014-09-24 LAB — BASIC METABOLIC PANEL
Anion gap: 4 — ABNORMAL LOW (ref 5–15)
BUN: 6 mg/dL (ref 6–20)
CO2: 22 mmol/L (ref 22–32)
Calcium: 6.4 mg/dL — CL (ref 8.9–10.3)
Chloride: 116 mmol/L — ABNORMAL HIGH (ref 101–111)
Creatinine, Ser: <0.3 mg/dL — ABNORMAL LOW (ref 0.44–1.00)
Glucose, Bld: 79 mg/dL (ref 65–99)
Potassium: 2.8 mmol/L — ABNORMAL LOW (ref 3.5–5.1)
Sodium: 142 mmol/L (ref 135–145)

## 2014-09-24 LAB — CBC
HCT: 32.2 % — ABNORMAL LOW (ref 36.0–46.0)
Hemoglobin: 10.7 g/dL — ABNORMAL LOW (ref 12.0–15.0)
MCH: 32 pg (ref 26.0–34.0)
MCHC: 33.2 g/dL (ref 30.0–36.0)
MCV: 96.4 fL (ref 78.0–100.0)
Platelets: 229 10*3/uL (ref 150–400)
RBC: 3.34 MIL/uL — ABNORMAL LOW (ref 3.87–5.11)
RDW: 14.2 % (ref 11.5–15.5)
WBC: 8.4 10*3/uL (ref 4.0–10.5)

## 2014-09-24 LAB — POTASSIUM: Potassium: 4.5 mmol/L (ref 3.5–5.1)

## 2014-09-24 LAB — MAGNESIUM: Magnesium: 2 mg/dL (ref 1.7–2.4)

## 2014-09-24 MED ORDER — ONDANSETRON HCL 4 MG PO TABS: 4.0000 mg | ORAL_TABLET | ORAL | Status: DC | PRN

## 2014-09-24 MED ORDER — METHOCARBAMOL 500 MG PO TABS
1000.0000 mg | ORAL_TABLET | Freq: Three times a day (TID) | ORAL | Status: DC
  Administered 2014-09-24 – 2014-09-26 (×8): 1000 mg via ORAL
  Filled 2014-09-24 (×10): qty 2

## 2014-09-24 MED ORDER — POTASSIUM CHLORIDE 10 MEQ/100ML IV SOLN
10.0000 meq | INTRAVENOUS | Status: AC
  Administered 2014-09-24 (×3): 10 meq via INTRAVENOUS
  Filled 2014-09-24 (×3): qty 100

## 2014-09-24 MED ORDER — OXYCODONE-ACETAMINOPHEN 5-325 MG PO TABS
1.0000 | ORAL_TABLET | ORAL | Status: DC | PRN
  Administered 2014-09-24 – 2014-09-26 (×3): 2 via ORAL
  Filled 2014-09-24 (×3): qty 2

## 2014-09-24 MED ORDER — POTASSIUM CHLORIDE CRYS ER 20 MEQ PO TBCR
40.0000 meq | EXTENDED_RELEASE_TABLET | Freq: Three times a day (TID) | ORAL | Status: DC
  Administered 2014-09-24 – 2014-09-26 (×7): 40 meq via ORAL
  Filled 2014-09-24 (×7): qty 2

## 2014-09-24 MED ORDER — SODIUM CHLORIDE 0.9 % IJ SOLN
10.0000 mL | INTRAMUSCULAR | Status: DC | PRN
  Administered 2014-09-25: 20 mL
  Administered 2014-09-25: 10 mL
  Filled 2014-09-24 (×2): qty 40

## 2014-09-24 MED ORDER — PROMETHAZINE HCL 25 MG PO TABS: 12.5000 mg | ORAL_TABLET | Freq: Four times a day (QID) | ORAL | Status: DC | PRN

## 2014-09-24 NOTE — Progress Notes (Signed)
Pt in bed. Husband at bedside. Pt given fentanyl 25 mcg for pain to abdomin per order. Abdominal dressing is clean dry and intact. No signs of distress at this time.

## 2014-09-24 NOTE — Progress Notes (Signed)
Spoke with Dr.Wyatt. He states to give pt 3 runs of potassium and that he will not treat calcium at this time.

## 2014-09-24 NOTE — Progress Notes (Signed)
Pt has critical lab value. Ca 6.4. Surgeon at [facility]. Waiting on response.

## 2014-09-24 NOTE — Care Management Note (Signed)
Case Management Note  Patient Details  Name: Alexis Olson MRN: [id number] Date of Birth: 04-23-1933  Subjective/Objective:                    Action/Plan:  Discussed home health with patient and visitors . Patient has assistance at home for dressing changes along with home health . Confirmed face sheet information with patient .   Provided list of home health agencies for [facility] . Patient will think about which agency she wants this evening . NCM will follow up in am.   Magdalen Spatz RN BSN [phone]    Expected Discharge Date:        09-25-14           Expected Discharge Plan:  [place]  In-House Referral:     Discharge planning Services  CM Consult  Post Acute Care Choice:  Home Health Choice offered to:     DME Arranged:    DME Agency:     HH Arranged:    [place] Agency:     Status of Service:  In process, will continue to follow  Medicare Important Message Given:  Yes-second notification given Date Medicare IM Given:    Medicare IM give by:    Date Additional Medicare IM Given:    Additional Medicare Important Message give by:     If discussed at [place] of Stay Meetings, dates discussed:    Additional Comments:  Marilu Favre, RN 09/24/2014, 3:52 PM

## 2014-09-24 NOTE — Significant Event (Signed)
Patient ambulated approximately 20 feet, then taken via wheelchair to 6N, transported there by RN Bella Kennedy and NT April. All personal belongings taken with patient. Patient's family at bedside and updated. Report called and given to receiving RN in 6N. Hyiu Ksor, RN>

## 2014-09-24 NOTE — Progress Notes (Signed)
2nd attempt for bath, Pt refused.

## 2014-09-24 NOTE — Progress Notes (Signed)
Offered Pt. a bath. Pt. stated she was getting medicine and wanted to wait. Will retry.

## 2014-09-24 NOTE — Progress Notes (Signed)
Discussed in LOS meeting. 

## 2014-09-24 NOTE — Significant Event (Signed)
Attempted to call report. Receiving RN not available. Will attempt again.

## 2014-09-24 NOTE — Progress Notes (Signed)
Patient ID: Alexis Olson, female   DOB: 1934/01/17, 81 y.o.   MRN: [id number] 6 Days Post-Op  Subjective: Pt c/o some pain.  Tolerating her soft diet.  Adamant that her foley stay in at discharge and she can see Dr. Nichola Sizer as an outpatient for a voiding trial.  Objective: Vital signs in last 24 hours: Temp:  [98.2 F (36.8 C)-99.4 F (37.4 C)] 98.5 F (36.9 C) (06/30 0747) Pulse Rate:  [91-104] 93 (06/30 0747) Resp:  [15-20] 15 (06/30 0747) BP: (119-136)/(53-76) 136/73 mmHg (06/30 0747) SpO2:  [93 %-96 %] 93 % (06/30 0800) Last BM Date: 09/23/14  Intake/Output from previous day: 06/29 0701 - 06/30 0700 In: 1210 [P.O.:960; IV Piggyback:250] Out: 4250 [Urine:3550; Stool:700] Intake/Output this shift: Total I/O In: -  Out: 200 [Urine:200]  PE: Abd: soft, appropriately tender, incision is clean and packed, +BS, ND Heart: regular Lungs: CTAB  Lab Results:   Recent Labs  09/23/14 0250 09/24/14 0500  WBC 9.1 8.4  HGB 9.7* 10.7*  HCT 29.3* 32.2*  PLT 217 229   BMET  Recent Labs  09/23/14 0230 09/24/14 0255  NA 141 142  K 3.1* 2.8*  CL 107 116*  CO2 27 22  GLUCOSE 108* 79  BUN 6 6  CREATININE 0.43* <0.30*  CALCIUM 7.9* 6.4*   PT/INR No results for input(s): LABPROT, INR in the last 72 hours. CMP     Component Value Date/Time   NA 142 09/24/2014 0255   K 2.8* 09/24/2014 0255   CL 116* 09/24/2014 0255   CO2 22 09/24/2014 0255   GLUCOSE 79 09/24/2014 0255   BUN 6 09/24/2014 0255   CREATININE <0.30* 09/24/2014 0255   CALCIUM 6.4* 09/24/2014 0255   PROT 5.4* 09/21/2014 0039   ALBUMIN 2.6* 09/21/2014 0039   AST 25 09/21/2014 0039   ALT 17 09/21/2014 0039   ALKPHOS 42 09/21/2014 0039   BILITOT 0.3 09/21/2014 0039   GFRNONAA NOT CALCULATED 09/24/2014 0255   GFRAA NOT CALCULATED 09/24/2014 0255   Lipase  No results found for: LIPASE     Studies/Results: Ct Angio Chest Pe W/cm &/or Wo Cm  09/22/2014   CLINICAL DATA:  Shortness of Breath  EXAM: CT  ANGIOGRAPHY CHEST WITH CONTRAST  TECHNIQUE: Multidetector CT imaging of the chest was performed using the standard protocol during bolus administration of intravenous contrast. Multiplanar CT image reconstructions and MIPs were obtained to evaluate the vascular anatomy.  CONTRAST:  164mL OMNIPAQUE IOHEXOL 350 MG/ML SOLN  COMPARISON:  09/22/2014  FINDINGS: Small left pleural effusion is noted. Bibasilar atelectatic changes are noted in the lower lobes. No focal nodular change is seen.  The thoracic inlet is within normal limits. The thoracic aorta and its branches are unremarkable. The pulmonary artery demonstrates a normal branching pattern without definitive filling defect to suggest pulmonary embolism. No hilar or mediastinal adenopathy is noted. Mild coronary calcifications are seen.  Visualized upper abdomen shows a cystic lesion within the right lobe of the liver unchanged from a prior exam. Some residual free air is noted within the abdomen from recent surgery.  Review of the MIP images confirms the above findings.  IMPRESSION: No evidence of pulmonary embolism.  Bilateral lower lobe atelectasis with evidence of a left-sided pleural effusion. These changes have increased slightly in the interval from the prior exam.   Electronically Signed   By: Inez Catalina M.D.   On: 09/22/2014 14:10    Anti-infectives: Anti-infectives    Start  Dose/Rate Route Frequency Ordered Stop   09/22/14 0600  vancomycin (VANCOCIN) 1,250 mg in sodium chloride 0.9 % 250 mL IVPB  Status:  Discontinued     1,250 mg 166.7 mL/hr over 90 Minutes Intravenous Every 24 hours 09/21/14 0211 09/21/14 1222   09/21/14 1800  vancomycin (VANCOCIN) 1,250 mg in sodium chloride 0.9 % 250 mL IVPB  Status:  Discontinued     1,250 mg 166.7 mL/hr over 90 Minutes Intravenous Every 12 hours 09/21/14 1222 09/23/14 1353   09/21/14 0300  vancomycin (VANCOCIN) 1,500 mg in sodium chloride 0.9 % 500 mL IVPB     1,500 mg 250 mL/hr over 120 Minutes  Intravenous  Once 09/21/14 0211 09/21/14 0504   09/21/14 0000  ertapenem (INVANZ) 1 g in sodium chloride 0.9 % 50 mL IVPB     1 g 100 mL/hr over 30 Minutes Intravenous Every 24 hours 09/20/14 2347     09/20/14 2200  piperacillin-tazobactam (ZOSYN) IVPB 3.375 g  Status:  Discontinued     3.375 g 100 mL/hr over 30 Minutes Intravenous 3 times per day 09/20/14 1805 09/20/14 1811   09/18/14 2300  cefOXitin (MEFOXIN) 2 g in dextrose 5 % 50 mL IVPB  Status:  Discontinued     2 g 100 mL/hr over 30 Minutes Intravenous 3 times per day 09/18/14 1540 09/20/14 1938   09/18/14 1445  cefTRIAXone (ROCEPHIN) 1 g in dextrose 5 % 50 mL IVPB     1 g 100 mL/hr over 30 Minutes Intravenous  Once 09/18/14 1435 09/18/14 1514   09/18/14 1445  metroNIDAZOLE (FLAGYL) IVPB 500 mg     500 mg 100 mL/hr over 60 Minutes Intravenous  Once 09/18/14 1435 09/18/14 1617       Assessment/Plan  POD 5, s/p ex lap with repair of cecal perforation  -soft diet  -mobilize and pulm toilet  -HH arranged for PT and RN for dressing changes -Invanz D4 -cont urecholine.  Patient adamant that foley stay in until she can she urologist as an outpatient for voiding trial.    -cont lomotil.  DC flexi-seal, patient always has diarrhea.  continue lomotil -cont BID dressing changes  Hypokalemia  -replace K again today. H/o JI bypass for weight loss, so she tends to have a lot of diarrhea  -check labs in am  PNA/Asthma  -appreciate medicine's assistance with this patient.  -on vanc  DVT prophylaxis  -scds/lovenox  Dispo: to floor today.  Hopefully home soon    LOS: 6 days    OSBORNE,KELLY E 09/24/2014, 9:57 AM Pager: [phone]

## 2014-09-24 NOTE — Consult Note (Signed)
[facility] Medical Consultation  Alexis Olson GGY:[id number] DOB: 09-16-1933 DOA: 09/18/2014 PCP: Gwendolyn Grant, MD   Requesting physician:  Date of consultation:  Reason for consultation: Dyspnea, wheezing, sedation  Impression/Recommendations Active Problems:   Pneumoperitoneum   Dyspnea   Acute respiratory failure with hypoxia   Hypokalemia   Elevated troponin   Chronic diarrhea  Pneumoperitoneum - Colon perforation s/p colonoscopy - Per primary service (General Surgery)  Acute respiratory failure with hypoxia - Bibasilar L>R airspace disease -Likely atelectasis but can't r/o HCAP vs aspiration -Care as prescribed by PCCM in visit today   Hypokalemia -Potassium goal >4 -Potassium IV 10 mEq 3 runs  -K Dur 40 mEq BID -Recheck potassium and magnesium in the a.m.  Elevated troponin -peaked at 0.12 - has now normalized   Chronic Dairrhea -Chronic for the last 39 years no further workup required  I will followup again tomorrow. Please contact me if I can be of assistance in the meanwhile. Thank you for this consultation.    Chief Complaint:  Admit HPI / Brief Narrative: 81 year old female with asthma, jejunal bypass around the age of 24, and cholecystectomy who presented from Dr. Nichola Sizer office after a colonoscopy with worsening abdominal pain. Patient had abdominal x-ray which showed significant pneumoperitoneum. Patient had urgent exploratory laparotomy with repair of cecal perforation on 6/25.  On 6/27-6/28 patient was noted to be very dyspneic and wheezing. Elink CCM was consulted and patient was started on IV steroids, Pulmicort, broncho-dilators. 6/28 morning the patient was noticed to be more sedated, still dyspneic and wheezing. [facility] service was consulted.  HPI/Subjective: 6/30 A/O 4, NAD. Patient states earlier was having abdominal pain but after she had BM (diarrhea) felt much better. States chronic diarrhea since her jejunal bypass around 82  years old.   Consultants:   Procedure/Significant Events:    Culture    Antibiotics: Ertapenem 6/26 > Vancomycin 6/26 >   DVT prophylaxis: Lovenox   Past Medical History  Diagnosis Date  . Obesity   . Lumbosacral disc disease     Chronic pain; History of osteomyelitis 2010 following ESI complication  . ALLERGIC RHINITIS   . 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 ANXIETY   . ASTHMA   . Bariatric surgery status   . COLONIC POLYPS, ADENOMATOUS, HX OF 2010  . DIVERTICULOSIS, COLON   . INSOMNIA   . URINARY URGENCY   . VITAMIN B12 DEFICIENCY   . Hypotension     pt denies  . Hypertension    Past Surgical History  Procedure Laterality Date  . Cholecystectomy    . Tonsillectomy    . Ileojejunal bypass  1976    for obesity  . Back surgery  1994    for ruptured disc  . Laparotomy N/A 09/18/2014    Procedure: EXPLORATORY LAPAROTOMY WITH REPAIR OF CECIAL PERFORATION;  Surgeon: Autumn Messing III, MD;  Location: [facility];  Service: General;  Laterality: N/A;   Social History:  reports that she has quit smoking. She has never used smokeless tobacco. She reports that she does not drink alcohol or use illicit drugs.  Allergies  Allergen Reactions  . Other Anaphylaxis    Red peppers--choking  . Azithromycin Other (See Comments)    Can't move joints  . Benadryl [Diphenhydramine Hcl] Other (See Comments)    Paradoxical response.  . Ciprofloxacin Hcl     REACTION: causes yeast inf  and refuses to take  .  Codeine Phosphate     REACTION: unspecified  . Hydrocodone Itching and Nausea And Vomiting  . Hydrocodone-Homatropine Itching and Nausea And Vomiting    REACTION: unspecified  . Levaquin [Levofloxacin In D5w] Other (See Comments)    Muscle soreness  . Meperidine Hcl   . Sulfa Antibiotics Other (See Comments)    Joint pain  . Ultram [Tramadol Hcl] Other (See Comments)    Can't move joints   Family History  Problem Relation  Age of Onset  . Cervical cancer Mother   . Liver disease Sister   . Kidney disease Sister   . Cirrhosis Sister   . Colon cancer Neg Hx   . Esophageal cancer Neg Hx   . Rectal cancer Neg Hx   . Stomach cancer Neg Hx     Prior to Admission medications   Medication Sig Start Date End Date Taking? Authorizing Provider  albuterol (PROVENTIL HFA;VENTOLIN HFA) 108 (90 BASE) MCG/ACT inhaler Inhale 1-2 puffs into the lungs every 6 (six) hours as needed for wheezing or shortness of breath.   Yes Historical Provider, MD  amitriptyline (ELAVIL) 100 MG tablet TAKE 1 TABLET AT BEDTIME 08/28/14  Yes Rowe Clack, MD  cyanocobalamin (,VITAMIN B-12,) 1000 MCG/ML injection Inject 1 mL (1,000 mcg total) into the muscle every 30 (thirty) days. 04/02/14  Yes Lafayette Dragon, MD  hydrOXYzine (ATARAX) 25 MG tablet Take 25 mg by mouth 3 (three) times daily as needed for itching.    Yes Historical Provider, MD  methocarbamol (ROBAXIN) 500 MG tablet TAKE 1 TABLET (500 MG TOTAL) BY MOUTH 3 (THREE) TIMES DAILY AS NEEDED (MUSCLE SPASM). 07/21/13  Yes Rowe Clack, MD  montelukast (SINGULAIR) 10 MG tablet Take 10 mg by mouth at bedtime.  02/11/14  Yes Historical Provider, MD  nystatin (MYCOSTATIN) 100000 UNIT/ML suspension TAKE 5 MLS (500,000 UNITS TOTAL) BY MOUTH 4 (FOUR) TIMES DAILY. 04/21/14  Yes Rowe Clack, MD  PX ENTERIC ASPIRIN 81 MG EC tablet Take 81 mg by mouth daily.  02/05/14  Yes Historical Provider, MD  acetaminophen-codeine (TYLENOL #2) 300-15 MG per tablet TAKE 1 TABLET EVERY 4 HOURS AS NEEDED FOR PAIN 09/28/12   Jearld Fenton, NP  ALPRAZolam Duanne Moron) 1 MG tablet Take 1 tablet (1 mg total) by mouth at bedtime as needed. Patient taking differently: Take 1 mg by mouth at bedtime as needed for sleep.  08/12/14   Rowe Clack, MD  azelastine (OPTIVAR) 0.05 % ophthalmic solution Place 1 drop into both eyes daily as needed (dry eyes).     Historical Provider, MD  budesonide-formoterol (SYMBICORT)  80-4.5 MCG/ACT inhaler Inhale 2 puffs into the lungs 2 (two) times daily. 02/10/13   Janith Lima, MD  cetirizine (ZYRTEC) 10 MG tablet Take 10 mg by mouth daily.  02/05/14   Historical Provider, MD  cholecalciferol (VITAMIN D) 1000 UNITS tablet Take 1,000 Units by mouth daily.    Historical Provider, MD  colestipol (COLESTID) 5 G granules Take 5 g by mouth daily. 09/18/14   Lafayette Dragon, MD  dicyclomine (BENTYL) 10 MG capsule Take 1 capsule (10 mg total) by mouth 3 (three) times daily. 05/12/14   Lafayette Dragon, MD  diphenoxylate-atropine (LOMOTIL) 2.5-0.025 MG per tablet Take 2 tablets by mouth 2 (two) times daily before a meal. Patient taking differently: Take 2 tablets by mouth 4 (four) times daily.  09/18/14   Lafayette Dragon, MD  Fe Fum-FA-B Cmp-C-Zn-Mg-Mn-Cu (HEMOCYTE PLUS) 106-1 MG CAPS TAKE ONE  TABLET BY MOUTH TWICE A DAY 02/10/14   Lafayette Dragon, MD  fluticasone [facility]) 50 MCG/ACT nasal spray 2 sprays by Nasal route daily.      Historical Provider, MD  gabapentin (NEURONTIN) 300 MG capsule Take 300 mg by mouth at bedtime.     Historical Provider, MD  levocetirizine (XYZAL) 5 MG tablet Take 1 tablet (5 mg total) by mouth every evening. 08/04/14   Rowe Clack, MD  magnesium oxide (MAG-OX 400) 400 MG tablet Take 400 mg by mouth 2 (two) times daily.     Historical Provider, MD  omeprazole (PRILOSEC) 20 MG capsule Take 1 capsule (20 mg total) by mouth daily. 05/12/14   Lafayette Dragon, MD  phenazopyridine (PYRIDIUM) 200 MG tablet Take 1 tablet (200 mg total) by mouth 3 (three) times daily as needed for pain. 03/11/14   Rowe Clack, MD  potassium chloride SA (K-DUR,KLOR-CON) 20 MEQ tablet Take 1 tablet (20 mEq total) by mouth daily. 08/11/14   Rowe Clack, MD  valACYclovir (VALTREX) 500 MG tablet Take 500 mg by mouth as needed.      Historical Provider, MD  vitamin E 400 UNIT capsule Take 400 Units by mouth daily.      Historical Provider, MD  zafirlukast (ACCOLATE) 20 MG tablet  Take 20 mg by mouth daily as needed (for breathing). Take 1 by mouth twice a day 11/13/11   Historical Provider, MD  zolpidem (AMBIEN) 5 MG tablet Take 1 tablet (5 mg total) by mouth at bedtime. 08/14/14   Rowe Clack, MD   Physical Exam: Blood pressure 138/68, pulse 91, temperature 98.8 F (37.1 C), temperature source Oral, resp. rate 16, height 5\' 5"  (1.651 m), weight 95.3 kg (210 lb 1.6 oz), SpO2 94 %. Filed Vitals:   09/24/14 1200 09/24/14 1438 09/24/14 1506 09/24/14 1919  BP:  135/79 138/68   Pulse: 86 92 95 91  Temp:  98 F (36.7 C) 98.8 F (37.1 C)   TempSrc:  Oral Oral   Resp: 10  20 16   Height:      Weight:      SpO2: 91% 93% 94% 94%     General:  A/O 4, NAD, sitting in bed comfortably  Eyes: He was equal round reactive to light and accommodation  Neck: Negative lymphadenopathy, negative JVD  Cardiovascular: Regular rhythm and rate, negative murmurs rubs or gallops, normal S1/S2  Respiratory: Clear to auscultation bilateral  Abdomen: Soft, nontender, midline incision covered and clean, negative erythema negative sign of infection     Labs on Admission:  Basic Metabolic Panel:  Recent Labs Lab 09/21/14 0039 09/22/14 0823 09/22/14 1435 09/23/14 0230 09/24/14 0255  NA 139 137 139 141 142  K 2.9* 2.4* 2.4* 3.1* 2.8*  CL 105 105 107 107 116*  CO2 29 27 28 27 22   GLUCOSE 128* 158* 161* 108* 79  BUN <5* <5* <5* 6 6  CREATININE 0.46 0.43* 0.44 0.43* <0.30*  CALCIUM 8.3* 7.7* 7.8* 7.9* 6.4*  MG  --  1.8  --  2.5*  --   PHOS  --   --   --  1.7*  --    Liver Function Tests:  Recent Labs Lab 09/21/14 0021 09/21/14 0039  AST 25 25  ALT 17 17  ALKPHOS 42 42  BILITOT 0.3 0.3  PROT 5.4* 5.4*  ALBUMIN 2.6* 2.6*   No results for input(s): LIPASE, AMYLASE in the last 168 hours. No results for input(s): AMMONIA in  the last 168 hours. CBC:  Recent Labs Lab 09/18/14 1359  09/20/14 2103 09/21/14 0021 09/22/14 0823 09/23/14 0250 09/24/14 0500   WBC 5.9  < > 13.4* 12.6* 9.3 9.1 8.4  NEUTROABS 4.4  --   --  9.7*  --   --   --   HGB 12.1  < > 10.8* 10.8* 10.6* 9.7* 10.7*  HCT 36.8  < > 33.7* 33.3* 32.7* 29.3* 32.2*  MCV 97.1  < > 97.7 99.1 95.3 94.2 96.4  PLT 214  < > 194 193 204 217 229  < > = values in this interval not displayed. Cardiac Enzymes:  Recent Labs Lab 09/21/14 0039 09/22/14 0823 09/22/14 1435 09/22/14 2020  TROPONINI 0.12* 0.04* 0.03 <0.03   BNP: Invalid input(s): POCBNP CBG: No results for input(s): GLUCAP in the last 168 hours.  Radiological Exams on Admission: No results found.  EKG: N/A  Care during the described time interval was provided by me .  I have reviewed this patient's available data, including medical history, events of note, physical examination, and all test results as part of my evaluation. I have personally reviewed and interpreted all radiology studies.  Time spent: 35 minute  Allie Bossier [facility] Pager [phone]  If 7PM-7AM, please contact night-coverage [email or web address] Password [facility] 09/24/2014, 8:37 PM

## 2014-09-25 DIAGNOSIS — J9601 Acute respiratory failure with hypoxia: Secondary | ICD-10-CM

## 2014-09-25 DIAGNOSIS — K668 Other specified disorders of peritoneum: Secondary | ICD-10-CM

## 2014-09-25 LAB — COMPREHENSIVE METABOLIC PANEL
ALT: 47 U/L (ref 14–54)
AST: 33 U/L (ref 15–41)
Albumin: 2.5 g/dL — ABNORMAL LOW (ref 3.5–5.0)
Alkaline Phosphatase: 41 U/L (ref 38–126)
Anion gap: 9 (ref 5–15)
BUN: 8 mg/dL (ref 6–20)
CO2: 28 mmol/L (ref 22–32)
Calcium: 8.4 mg/dL — ABNORMAL LOW (ref 8.9–10.3)
Chloride: 102 mmol/L (ref 101–111)
Creatinine, Ser: 0.49 mg/dL (ref 0.44–1.00)
GFR calc Af Amer: >60 mL/min (ref >60)
GFR calc non Af Amer: >60 mL/min (ref >60)
Glucose, Bld: 84 mg/dL (ref 65–99)
Potassium: 3.8 mmol/L (ref 3.5–5.1)
Sodium: 139 mmol/L (ref 135–145)
Total Bilirubin: <0.1 mg/dL — ABNORMAL LOW (ref 0.3–1.2)
Total Protein: 5.4 g/dL — ABNORMAL LOW (ref 6.5–8.1)

## 2014-09-25 LAB — MAGNESIUM: Magnesium: 1.9 mg/dL (ref 1.7–2.4)

## 2014-09-25 LAB — CBC WITH DIFFERENTIAL/PLATELET
Basophils Absolute: 0 10*3/uL (ref 0.0–0.1)
Basophils Relative: 0 % (ref 0–1)
Eosinophils Absolute: 0.2 10*3/uL (ref 0.0–0.7)
Eosinophils Relative: 2 % (ref 0–5)
HCT: 32.4 % — ABNORMAL LOW (ref 36.0–46.0)
Hemoglobin: 10.4 g/dL — ABNORMAL LOW (ref 12.0–15.0)
Lymphocytes Relative: 28 % (ref 12–46)
Lymphs Abs: 2.4 10*3/uL (ref 0.7–4.0)
MCH: 30.9 pg (ref 26.0–34.0)
MCHC: 32.1 g/dL (ref 30.0–36.0)
MCV: 96.1 fL (ref 78.0–100.0)
Monocytes Absolute: 0.8 10*3/uL (ref 0.1–1.0)
Monocytes Relative: 10 % (ref 3–12)
Neutro Abs: 5.1 10*3/uL (ref 1.7–7.7)
Neutrophils Relative %: 60 % (ref 43–77)
Platelets: 264 10*3/uL (ref 150–400)
RBC: 3.37 MIL/uL — ABNORMAL LOW (ref 3.87–5.11)
RDW: 14.4 % (ref 11.5–15.5)
WBC: 8.5 10*3/uL (ref 4.0–10.5)

## 2014-09-25 MED ORDER — HYDROCORTISONE 2.5 % RE CREA
TOPICAL_CREAM | Freq: Four times a day (QID) | RECTAL | Status: DC | PRN
  Administered 2014-09-25: 12:00:00 via RECTAL
  Filled 2014-09-25: qty 28.35

## 2014-09-25 MED ORDER — METHOCARBAMOL 500 MG PO TABS: 500.0000 mg | ORAL_TABLET | Freq: Three times a day (TID) | ORAL | Status: DC

## 2014-09-25 MED ORDER — GABAPENTIN 300 MG PO CAPS
300.0000 mg | ORAL_CAPSULE | Freq: Every day | ORAL | Status: DC
  Administered 2014-09-25: 300 mg via ORAL
  Filled 2014-09-25: qty 1

## 2014-09-25 NOTE — Progress Notes (Signed)
[facility] Progress Note  7 Days Post-Op  Subjective: Pt anxious, angry with her family.  No N/V, tolerated breakfast well.  Still refuses to have foley removed.  Ambulating up to the bathroom.  Sent PT away earlier to come back.  Having diarrhea, but cramping resolved.  Seems to complain about everything.    Objective: Vital signs in last 24 hours: Temp:  [98 F (36.7 C)-98.8 F (37.1 C)] 98.3 F (36.8 C) (07/01 0503) Pulse Rate:  [83-95] 83 (07/01 0503) Resp:  [16-20] 18 (07/01 0503) BP: (113-138)/(57-79) 117/63 mmHg (07/01 0503) SpO2:  [93 %-94 %] 94 % (07/01 0936) Last BM Date: 09/25/14  Intake/Output from previous day: 06/30 0701 - 07/01 0700 In: 560 [P.O.:360; IV Piggyback:50] Out: 6283 [Urine:3950; Stool:1] Intake/Output this shift: Total I/O In: 20 [I.V.:20] Out: 850 [Urine:850]  PE: Gen:  Alert, NAD, pleasant Abd: Soft, NT/ND, +BS, no HSM, midline wound is clean with good granulation tissue formation, no significant drainage or foul smell, skin on pubis is erythematous from tape   Lab Results:   Recent Labs  09/24/14 0500 09/25/14 0435  WBC 8.4 8.5  HGB 10.7* 10.4*  HCT 32.2* 32.4*  PLT 229 264   BMET  Recent Labs  09/24/14 0255 09/24/14 2040 09/25/14 0435  NA 142  --  139  K 2.8* 4.5 3.8  CL 116*  --  102  CO2 22  --  28  GLUCOSE 79  --  84  BUN 6  --  8  CREATININE <0.30*  --  0.49  CALCIUM 6.4*  --  8.4*   PT/INR No results for input(s): LABPROT, INR in the last 72 hours. CMP     Component Value Date/Time   NA 139 09/25/2014 0435   K 3.8 09/25/2014 0435   CL 102 09/25/2014 0435   CO2 28 09/25/2014 0435   GLUCOSE 84 09/25/2014 0435   BUN 8 09/25/2014 0435   CREATININE 0.49 09/25/2014 0435   CALCIUM 8.4* 09/25/2014 0435   PROT 5.4* 09/25/2014 0435   ALBUMIN 2.5* 09/25/2014 0435   AST 33 09/25/2014 0435   ALT 47 09/25/2014 0435   ALKPHOS 41 09/25/2014 0435   BILITOT <0.1* 09/25/2014 0435   GFRNONAA >60 09/25/2014  0435   GFRAA >60 09/25/2014 0435   Lipase  No results found for: LIPASE     Studies/Results: No results found.  Anti-infectives: Anti-infectives    Start     Dose/Rate Route Frequency Ordered Stop   09/22/14 0600  vancomycin (VANCOCIN) 1,250 mg in sodium chloride 0.9 % 250 mL IVPB  Status:  Discontinued     1,250 mg 166.7 mL/hr over 90 Minutes Intravenous Every 24 hours 09/21/14 0211 09/21/14 1222   09/21/14 1800  vancomycin (VANCOCIN) 1,250 mg in sodium chloride 0.9 % 250 mL IVPB  Status:  Discontinued     1,250 mg 166.7 mL/hr over 90 Minutes Intravenous Every 12 hours 09/21/14 1222 09/23/14 1353   09/21/14 0300  vancomycin (VANCOCIN) 1,500 mg in sodium chloride 0.9 % 500 mL IVPB     1,500 mg 250 mL/hr over 120 Minutes Intravenous  Once 09/21/14 0211 09/21/14 0504   09/21/14 0000  ertapenem (INVANZ) 1 g in sodium chloride 0.9 % 50 mL IVPB     1 g 100 mL/hr over 30 Minutes Intravenous Every 24 hours 09/20/14 2347     09/20/14 2200  piperacillin-tazobactam (ZOSYN) IVPB 3.375 g  Status:  Discontinued     3.375 g 100 mL/hr over  30 Minutes Intravenous 3 times per day 09/20/14 1805 09/20/14 1811   09/18/14 2300  cefOXitin (MEFOXIN) 2 g in dextrose 5 % 50 mL IVPB  Status:  Discontinued     2 g 100 mL/hr over 30 Minutes Intravenous 3 times per day 09/18/14 1540 09/20/14 1938   09/18/14 1445  cefTRIAXone (ROCEPHIN) 1 g in dextrose 5 % 50 mL IVPB     1 g 100 mL/hr over 30 Minutes Intravenous  Once 09/18/14 1435 09/18/14 1514   09/18/14 1445  metroNIDAZOLE (FLAGYL) IVPB 500 mg     500 mg 100 mL/hr over 60 Minutes Intravenous  Once 09/18/14 1435 09/18/14 1617       Assessment/Plan POD #7, s/p ex lap with repair of cecal perforation  -Tolerating soft diet  -Mobilize and pulm toilet  -HH arranged for PT and RN for dressing changes -Invanz Day #5 -Cont lomotil, bentyl. -Cont BID dressing changes, ?VAC Hypokalemia  -Normal today.  H/o JI bypass for weight loss, so she tends  to have a lot of diarrhea  -bmet in am if still here PNA/Asthma  -Appreciate medicine's/CCM assistance with this patient.  -Weaning off steroids -Vanc stopped Urinary retention -Cont urecholine. Patient adamant that foley stay in until she can she urologist as an outpatient for voiding trial.  -Continue foley and follow up with Dr. Lucas Mallow -Dr. Cloria Spring office to call patient to arrange time/date next week for voiding trial DVT prophylaxis  -scds/lovenox  Dispo: on floor. D/c today or tomorrow     LOS: 7 days    Nat Christen 09/25/2014, 12:12 PM Pager: [phone]

## 2014-09-25 NOTE — Progress Notes (Signed)
Physical Therapy Treatment Patient Details Name: Alexis Olson MRN: [id number] DOB: 03-08-1934 Today's Date: 2014/10/23    History of Present Illness Patient is a 80 y/o female s/p exp lap on 6/25 following colonoscopy with colonic perforation.  PMH positive for lumbar disc surgery, bariatric surgery, anemia, anxiety and asthma.    PT Comments    Patient progressing to hallway ambulation.  Still needs some assist for transfers, but has good family support.  Feel she can continue ambulation with nursing assist if not d/c.  Will continue to follow during acute stay.  Will need 3:1 for home.   Follow Up Recommendations  Home health PT;Supervision/Assistance - 24 hour     Equipment Recommendations  3in1 (PT)    Recommendations for Other Services       Precautions / Restrictions Precautions Precautions: Fall    Mobility  Bed Mobility Overal bed mobility: Needs Assistance   Rolling: Supervision Sidelying to sit: Min assist       General bed mobility comments: rail for rolling and side to sit  Transfers Overall transfer level: Needs assistance Equipment used: Rolling walker (2 wheeled) Transfers: Sit to/from Stand Sit to Stand: Min assist;From elevated surface         General transfer comment: lifting assist from bed, one hand on walker, one on bed  Ambulation/Gait Ambulation/Gait assistance: Min guard Ambulation Distance (Feet): 200 Feet Assistive device: Rolling walker (2 wheeled) Gait Pattern/deviations: Step-through pattern;Trunk flexed;Decreased stride length     General Gait Details: slow pace, mildly flexed posture   Stairs            Wheelchair Mobility    Modified Rankin (Stroke Patients Only)       Balance             Standing balance-Leahy Scale: Fair Standing balance comment: deveoping strength for balance without UE support                    Cognition Arousal/Alertness: Awake/alert Behavior During Therapy: WFL for  tasks assessed/performed Overall Cognitive Status: Within Functional Limits for tasks assessed                      Exercises      General Comments        Pertinent Vitals/Pain Faces Pain Scale: Hurts a little bit Pain Location: abdomen Pain Descriptors / Indicators: Aching;Sore Pain Intervention(s): Monitored during session    Home Living                      Prior Function            PT Goals (current goals can now be found in the care plan section) Progress towards PT goals: Progressing toward goals    Frequency  Min 3X/week    PT Plan Current plan remains appropriate    Co-evaluation             End of Session Equipment Utilized During Treatment: Gait belt Activity Tolerance: Patient tolerated treatment well Patient left: in chair;with call bell/phone within reach     Time: 1420-1448 PT Time Calculation (min) (ACUTE ONLY): 28 min  Charges:  $Gait Training: 8-22 mins $Therapeutic Activity: 8-22 mins                    G Codes:      Alexis Olson,Alexis Olson 23-Oct-2014, 4:15 PM Magda Kiel, [place] 10/23/2014

## 2014-09-25 NOTE — Progress Notes (Signed)
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 Nichola Sizer office after a colonoscopy with worsening abdominal pain. Patient had abdominal x-ray which showed significant pneumoperitoneum. Patient had urgent exploratory laparotomy with repair of cecal perforation on 6/25.  On 6/27-6/28 patient was noted to be very dyspneic and wheezing. Elink CCM was consulted and patient was started on IV steroids, Pulmicort, broncho-dilators. 6/28 morning the patient was noticed to be more sedated, still dyspneic and wheezing. TRH service was consulted.   Assessment/Plan:  Pneumoperitoneum - Colon perforation s/p colonoscopy - Per primary service (General Surgery)  Acute respiratory failure with hypoxia - Bibasilar L>R airspace disease, asthma exacerbation -Likely atelectasis but can't r/o HCAP vs aspiration - As per pulmonary input on 6/29: Supportive care, broad-spectrum antibiotics, taper steroids over 5 days (from 6/30) to off and early mobilization - Currently on IV ertapenem. Consider switching to oral Augmentin to complete total 10 days course - Hypoxia resolved. - Blood cultures 2: No lower to date  Hypokalemia - Replaced.  Elevated troponin -peaked at 0.12 - has now normalized. Possibly related to demand ischemia from acute respiratory failure. Consider 2-D echo and outpatient cardiology consultation.   Dairrhea -If continues to worsen, check C. Difficile  Anemia - Stable    DVT prophylaxis: Lovenox Code Status: Full Family Communication: Discussed with spouse at bedside Disposition Plan: As per surgical service, possible discharge 7/2.   Consultants:  CCM-signed off  Procedures:  Exploratory laparotomy 6/25  Antibiotics:  IV cefoxitin 6/24 > 6/26  IV Rocephin 1 dose  6/24  Flagyl 1 dose 6/24  IV ertapenem 6/26 >  IV vancomycin 6/26 > 6/28   Subjective: Patient denies dyspnea, chest pain. Minimal dry cough.  Objective: Filed Vitals:   09/24/14 2154 09/25/14 0503 09/25/14 0936 09/25/14 1256  BP: 113/57 117/63  147/78  Pulse: 87 83  97  Temp: 98.4 F (36.9 C) 98.3 F (36.8 C)  98 F (36.7 C)  TempSrc: Oral Oral  Oral  Resp: 18 18  17   Height:      Weight:      SpO2: 94% 93% 94% 94%    Intake/Output Summary (Last 24 hours) at 09/25/14 1655 Last data filed at 09/25/14 1421  Gross per 24 hour  Intake     70 ml  Output   4951 ml  Net  -4881 ml   Filed Weights   09/18/14 1320 09/18/14 2147  Weight: 86.183 kg (190 lb) 95.3 kg (210 lb 1.6 oz)     Exam:  General exam: Pleasant elderly female sitting up comfortably propped up in bed eating breakfast this morning. Respiratory system: Clear to auscultation. No increased work of breathing. Cardiovascular system: S1 & S2 heard, RRR. No JVD, murmurs, gallops, clicks or pedal edema. Gastrointestinal system: Abdomen is nondistended, soft and nontender. Dressing site clean and dry. Normal bowel sounds heard. Central nervous system: Alert and oriented. No focal neurological deficits. Extremities: Symmetric 5 x 5 power.   Data Reviewed: Basic Metabolic Panel:  Recent Labs Lab 09/22/14 0823 09/22/14 1435 09/23/14 0230 09/24/14 0255 09/24/14 2040 09/25/14 0435  NA 137 139 141 142  --  139  K 2.4* 2.4* 3.1* 2.8* 4.5 3.8  CL 105 107 107 116*  --  102  CO2 27 28 27 22   --  28  GLUCOSE 158* 161* 108*  79  --  84  BUN <5* <5* 6 6  --  8  CREATININE 0.43* 0.44 0.43* <0.30*  --  0.49  CALCIUM 7.7* 7.8* 7.9* 6.4*  --  8.4*  MG 1.8  --  2.5*  --  2.0 1.9  PHOS  --   --  1.7*  --   --   --    Liver Function Tests:  Recent Labs Lab 09/21/14 0021 09/21/14 0039 09/25/14 0435  AST 25 25 33  ALT 17 17 47  ALKPHOS 42 42 41  BILITOT 0.3 0.3 <0.1*  PROT 5.4* 5.4* 5.4*  ALBUMIN 2.6* 2.6*  2.5*   No results for input(s): LIPASE, AMYLASE in the last 168 hours. No results for input(s): AMMONIA in the last 168 hours. CBC:  Recent Labs Lab 09/21/14 0021 09/22/14 0823 09/23/14 0250 09/24/14 0500 09/25/14 0435  WBC 12.6* 9.3 9.1 8.4 8.5  NEUTROABS 9.7*  --   --   --  5.1  HGB 10.8* 10.6* 9.7* 10.7* 10.4*  HCT 33.3* 32.7* 29.3* 32.2* 32.4*  MCV 99.1 95.3 94.2 96.4 96.1  PLT 193 204 217 229 264   Cardiac Enzymes:  Recent Labs Lab 09/21/14 0039 09/22/14 0823 09/22/14 1435 09/22/14 2020  TROPONINI 0.12* 0.04* 0.03 <0.03   BNP (last 3 results) No results for input(s): PROBNP in the last 8760 hours. CBG: No results for input(s): GLUCAP in the last 168 hours.  Recent Results (from the past 240 hour(s))  MRSA PCR Screening     Status: None   Collection Time: 09/18/14  9:52 PM  Result Value Ref Range Status   MRSA by PCR NEGATIVE NEGATIVE Final    Comment:        The GeneXpert MRSA Assay (FDA approved for NASAL specimens only), is one component of a comprehensive MRSA colonization surveillance program. It is not intended to diagnose MRSA infection nor to guide or monitor treatment for MRSA infections.   Culture, Urine     Status: None   Collection Time: 09/20/14  6:05 PM  Result Value Ref Range Status   Specimen Description URINE, CLEAN CATCH  Final   Special Requests NONE  Final   Culture NO GROWTH 2 DAYS  Final   Report Status 09/22/2014 FINAL  Final  Culture, blood (routine x 2)     Status: None (Preliminary result)   Collection Time: 09/21/14  1:25 AM  Result Value Ref Range Status   Specimen Description BLOOD LEFT ARM  Final   Special Requests BOTTLES DRAWN AEROBIC ONLY 6CC  Final   Culture NO GROWTH 4 DAYS  Final   Report Status PENDING  Incomplete  Culture, blood (routine x 2)     Status: None (Preliminary result)   Collection Time: 09/21/14  1:31 AM  Result Value Ref Range Status   Specimen Description BLOOD LEFT HAND  Final   Special  Requests BOTTLES DRAWN AEROBIC ONLY 6CC  Final   Culture NO GROWTH 4 DAYS  Final   Report Status PENDING  Incomplete  Culture, Urine     Status: None   Collection Time: 09/22/14  8:50 AM  Result Value Ref Range Status   Specimen Description URINE, CATHETERIZED  Final   Special Requests NONE  Final   Culture NO GROWTH 1 DAY  Final   Report Status 09/23/2014 FINAL  Final  Clostridium Difficile by PCR (not at [facility])     Status: None   Collection Time: 09/22/14 10:28 AM  Result Value Ref  Range Status   C difficile by pcr NEGATIVE NEGATIVE Final         Studies: No results found.      Scheduled Meds: . amitriptyline  50 mg Oral QHS  . arformoterol  15 mcg Nebulization BID  . bethanechol  5 mg Oral TID  . budesonide  0.5 mg Nebulization BID  . dicyclomine  10 mg Oral TID AC  . diphenoxylate-atropine  2 tablet Oral QID  . enoxaparin (LOVENOX) injection  30 mg Subcutaneous Q12H  . ertapenem  1 g Intravenous Q24H  . gabapentin  300 mg Oral QHS  . methocarbamol  1,000 mg Oral TID  . pantoprazole  40 mg Oral Daily  . potassium chloride  40 mEq Oral TID  . [START ON 09/27/2014] predniSONE  10 mg Oral Q breakfast  . predniSONE  20 mg Oral Q breakfast   Continuous Infusions:   Active Problems:   Pneumoperitoneum   Dyspnea   Acute respiratory failure with hypoxia   Hypokalemia   Elevated troponin   Chronic diarrhea    Time spent: 25 minutes.    Vernell Leep, MD, FACP, FHM. [facility] Pager [phone]  If 7PM-7AM, please contact night-coverage [email or web address] Password [email or web address] 09/25/2014, 4:55 PM    LOS: 7 days

## 2014-09-25 NOTE — Care Management Note (Addendum)
Case Management Note  Patient Details  Name: Alexis Olson MRN: [id number] Date of Birth: [birth year]  Subjective/Objective:                    Action/Plan:  Patient interested in [facility] with [facility] unable to take referral due to staffing. Patient was interested in [place]. Contacted Corliss Blacker with [facility] , however, [facility] is not in network with patient's insurance. Patient is aware , would like [facility] . Patient has had [facility] in past , would prefer different HHRN than she had last time . Santiago Glad with [facility] aware. Magdalen Spatz RN BSN [phone]   Expected Discharge Date:                  Expected Discharge Plan:  [place]  In-House Referral:     Discharge planning Services  CM Consult  Post Acute Care Choice:  Home Health Choice offered to:  Patient  DME Arranged:    DME Agency:     HH Arranged:  RN, PT [place] Agency:  [facility]  Status of Service:  In process, will continue to follow  Medicare Important Message Given:  Yes-second notification given Date Medicare IM Given:    Medicare IM give by:    Date Additional Medicare IM Given:    Additional Medicare Important Message give by:     If discussed at [place] of Stay Meetings, dates discussed:    Additional Comments:  Marilu Favre, RN 09/25/2014, 12:13 PM

## 2014-09-26 ENCOUNTER — Encounter (HOSPITAL_COMMUNITY): Payer: Self-pay | Admitting: General Surgery

## 2014-09-26 DIAGNOSIS — K631 Perforation of intestine (nontraumatic): Secondary | ICD-10-CM | POA: Diagnosis present

## 2014-09-26 LAB — BASIC METABOLIC PANEL WITH GFR
Anion gap: 9 (ref 5–15)
BUN: 8 mg/dL (ref 6–20)
CO2: 28 mmol/L (ref 22–32)
Calcium: 8.5 mg/dL — ABNORMAL LOW (ref 8.9–10.3)
Chloride: 101 mmol/L (ref 101–111)
Creatinine, Ser: 0.58 mg/dL (ref 0.44–1.00)
GFR calc Af Amer: >60 mL/min
GFR calc non Af Amer: >60 mL/min
Glucose, Bld: 85 mg/dL (ref 65–99)
Potassium: 4.2 mmol/L (ref 3.5–5.1)
Sodium: 138 mmol/L (ref 135–145)

## 2014-09-26 LAB — CULTURE, BLOOD (ROUTINE X 2)
Culture: NO GROWTH
Culture: NO GROWTH

## 2014-09-26 MED ORDER — AMOXICILLIN-POT CLAVULANATE 875-125 MG PO TABS: 1.0000 | ORAL_TABLET | Freq: Two times a day (BID) | ORAL | Status: DC

## 2014-09-26 MED ORDER — PREDNISONE 10 MG PO TABS: 10.0000 mg | ORAL_TABLET | Freq: Every day | ORAL | Status: AC

## 2014-09-26 MED ORDER — OXYCODONE-ACETAMINOPHEN 5-325 MG PO TABS: 1.0000 | ORAL_TABLET | Freq: Four times a day (QID) | ORAL | Status: DC | PRN

## 2014-09-26 MED ORDER — METHOCARBAMOL 500 MG PO TABS: 500.0000 mg | ORAL_TABLET | Freq: Four times a day (QID) | ORAL | Status: DC | PRN

## 2014-09-26 MED ORDER — BETHANECHOL CHLORIDE 5 MG PO TABS: 5.0000 mg | ORAL_TABLET | Freq: Three times a day (TID) | ORAL | Status: DC

## 2014-09-26 MED ORDER — FLUCONAZOLE 100 MG PO TABS
150.0000 mg | ORAL_TABLET | Freq: Once | ORAL | Status: AC
  Administered 2014-09-26: 150 mg via ORAL
  Filled 2014-09-26: qty 2

## 2014-09-26 MED ORDER — HEMOCYTE PLUS 106-1 MG PO CAPS: 1.0000 | ORAL_CAPSULE | Freq: Two times a day (BID) | ORAL | Status: DC

## 2014-09-26 MED ORDER — METHOCARBAMOL 500 MG PO TABS: ORAL_TABLET | ORAL | Status: DC

## 2014-09-26 NOTE — Care Management Note (Signed)
Case Management Note  Patient Details  Name: Alexis Olson MRN: [id number] Date of Birth: 05-16-1933  Subjective/Objective:                   Free intra-abdominal air (pneumoperitoneum)  Perforated bowel Acute abdominal pain Action/Plan: Discharge planning  Expected Discharge Date:                  Expected Discharge Plan:  [place]  In-House Referral:     Discharge planning Services  CM Consult  Post Acute Care Choice:  Home Health Choice offered to:  Patient  DME Arranged:    DME Agency:     HH Arranged:  RN, PT [place] Agency:  [facility]  Status of Service:  Completed, signed off  Medicare Important Message Given:  Yes-second notification given Date Medicare IM Given:    Medicare IM give by:    Date Additional Medicare IM Given:    Additional Medicare Important Message give by:     If discussed at [place] of Stay Meetings, dates discussed:    Additional Comments: CM received call from RN to please arrange for 3n1.  Cm called [facility] rep, Tiffany to notify of discharge and [facility] rep, Jeneen Rinks to please deliver the 3n1 to room so pt can go home.  No other Cm needs were communcated. Dellie Catholic, RN 09/26/2014, 3:45 PM

## 2014-09-26 NOTE — Progress Notes (Signed)
PROGRESS NOTE    Alexis Olson HUD:[id number] DOB: November 25, 1933 DOA: 09/18/2014 PCP: Gwendolyn Grant, MD  HPI/Brief narrative 81 year old female with asthma, jejunal bypass around the age of 52, and cholecystectomy who presented from Dr. Nichola Sizer office after a colonoscopy with worsening abdominal pain. Patient had abdominal x-ray which showed significant pneumoperitoneum. Patient had urgent exploratory laparotomy with repair of cecal perforation on 6/25.  On 6/27-6/28 patient was noted to be very dyspneic and wheezing. Elink CCM was consulted and patient was started on IV steroids, Pulmicort, broncho-dilators. 6/28 morning the patient was noticed to be more sedated, still dyspneic and wheezing. TRH service was consulted.   Assessment/Plan:  Pneumoperitoneum - Colon perforation s/p colonoscopy - Per primary service (General Surgery)  Acute respiratory failure with hypoxia - Bibasilar L>R airspace disease, asthma exacerbation -Likely atelectasis but can't r/o HCAP vs aspiration - As per pulmonary input on 6/29: Supportive care, broad-spectrum antibiotics, taper steroids over 5 days (from 6/30) to off and early mobilization - Currently on IV ertapenem. Consider switching to oral Augmentin to complete total 10 days course - Hypoxia resolved. - Blood cultures 2: No growth to date  Hypokalemia - Replaced.  Elevated troponin -peaked at 0.12 - has now normalized. Possibly related to demand ischemia from acute respiratory failure. Consider 2-D echo and outpatient cardiology consultation.   Diarrhea - C. difficile PCR -09/22/14. Improved. Apparently has up to 6 BMs per day secondary to her intestinal bypass for weight loss surgery many years ago.  Anemia - Stable and outpatient follow-up.    DVT prophylaxis: Lovenox Code Status: Full Family Communication: Discussed with spouse at bedside Disposition Plan: Discussed with surgical team who are discharging patient home  today.   Consultants:  CCM-signed off  Procedures:  Exploratory laparotomy 6/25  Antibiotics:  IV cefoxitin 6/24 > 6/26  IV Rocephin 1 dose 6/24  Flagyl 1 dose 6/24  IV ertapenem 6/26 >  IV vancomycin 6/26 > 6/28   Subjective: Denies chest pain, cough or dyspnea. No other complaints reported.  Objective: Filed Vitals:   09/25/14 2124 09/26/14 0550 09/26/14 0935 09/26/14 0940  BP:  100/57    Pulse:  80    Temp:  99.4 F (37.4 C)    TempSrc:  Oral    Resp:  19    Height:      Weight:      SpO2: 94% 96% 98% 98%    Intake/Output Summary (Last 24 hours) at 09/26/14 1156 Last data filed at 09/26/14 0556  Gross per 24 hour  Intake    530 ml  Output   3500 ml  Net  -2970 ml   Filed Weights   09/18/14 1320 09/18/14 2147  Weight: 86.183 kg (190 lb) 95.3 kg (210 lb 1.6 oz)     Exam:  General exam: Pleasant elderly female sitting up comfortably propped up in bed this morning. Respiratory system: Clear to auscultation. No increased work of breathing. Cardiovascular system: S1 & S2 heard, RRR. No JVD, murmurs, gallops, clicks or pedal edema. Gastrointestinal system: Abdomen is nondistended, soft and nontender. Dressing site clean and dry. Normal bowel sounds heard. Central nervous system: Alert and oriented. No focal neurological deficits. Extremities: Symmetric 5 x 5 power.   Data Reviewed: Basic Metabolic Panel:  Recent Labs Lab 09/22/14 0823 09/22/14 1435 09/23/14 0230 09/24/14 0255 09/24/14 2040 09/25/14 0435 09/26/14 0438  NA 137 139 141 142  --  139 138  K 2.4* 2.4* 3.1* 2.8* 4.5 3.8 4.2  CL 105  107 107 116*  --  102 101  CO2 27 28 27 22   --  28 28  GLUCOSE 158* 161* 108* 79  --  84 85  BUN <5* <5* 6 6  --  8 8  CREATININE 0.43* 0.44 0.43* <0.30*  --  0.49 0.58  CALCIUM 7.7* 7.8* 7.9* 6.4*  --  8.4* 8.5*  MG 1.8  --  2.5*  --  2.0 1.9  --   PHOS  --   --  1.7*  --   --   --   --    Liver Function Tests:  Recent Labs Lab 09/21/14 0021  09/21/14 0039 09/25/14 0435  AST 25 25 33  ALT 17 17 47  ALKPHOS 42 42 41  BILITOT 0.3 0.3 <0.1*  PROT 5.4* 5.4* 5.4*  ALBUMIN 2.6* 2.6* 2.5*   No results for input(s): LIPASE, AMYLASE in the last 168 hours. No results for input(s): AMMONIA in the last 168 hours. CBC:  Recent Labs Lab 09/21/14 0021 09/22/14 0823 09/23/14 0250 09/24/14 0500 09/25/14 0435  WBC 12.6* 9.3 9.1 8.4 8.5  NEUTROABS 9.7*  --   --   --  5.1  HGB 10.8* 10.6* 9.7* 10.7* 10.4*  HCT 33.3* 32.7* 29.3* 32.2* 32.4*  MCV 99.1 95.3 94.2 96.4 96.1  PLT 193 204 217 229 264   Cardiac Enzymes:  Recent Labs Lab 09/21/14 0039 09/22/14 0823 09/22/14 1435 09/22/14 2020  TROPONINI 0.12* 0.04* 0.03 <0.03   BNP (last 3 results) No results for input(s): PROBNP in the last 8760 hours. CBG: No results for input(s): GLUCAP in the last 168 hours.  Recent Results (from the past 240 hour(s))  MRSA PCR Screening     Status: None   Collection Time: 09/18/14  9:52 PM  Result Value Ref Range Status   MRSA by PCR NEGATIVE NEGATIVE Final    Comment:        The GeneXpert MRSA Assay (FDA approved for NASAL specimens only), is one component of a comprehensive MRSA colonization surveillance program. It is not intended to diagnose MRSA infection nor to guide or monitor treatment for MRSA infections.   Culture, Urine     Status: None   Collection Time: 09/20/14  6:05 PM  Result Value Ref Range Status   Specimen Description URINE, CLEAN CATCH  Final   Special Requests NONE  Final   Culture NO GROWTH 2 DAYS  Final   Report Status 09/22/2014 FINAL  Final  Culture, blood (routine x 2)     Status: None (Preliminary result)   Collection Time: 09/21/14  1:25 AM  Result Value Ref Range Status   Specimen Description BLOOD LEFT ARM  Final   Special Requests BOTTLES DRAWN AEROBIC ONLY 6CC  Final   Culture NO GROWTH 4 DAYS  Final   Report Status PENDING  Incomplete  Culture, blood (routine x 2)     Status: None  (Preliminary result)   Collection Time: 09/21/14  1:31 AM  Result Value Ref Range Status   Specimen Description BLOOD LEFT HAND  Final   Special Requests BOTTLES DRAWN AEROBIC ONLY 6CC  Final   Culture NO GROWTH 4 DAYS  Final   Report Status PENDING  Incomplete  Culture, Urine     Status: None   Collection Time: 09/22/14  8:50 AM  Result Value Ref Range Status   Specimen Description URINE, CATHETERIZED  Final   Special Requests NONE  Final   Culture NO GROWTH 1 DAY  Final   Report Status 09/23/2014 FINAL  Final  Clostridium Difficile by PCR (not at [facility])     Status: None   Collection Time: 09/22/14 10:28 AM  Result Value Ref Range Status   C difficile by pcr NEGATIVE NEGATIVE Final         Studies: No results found.      Scheduled Meds: . amitriptyline  50 mg Oral QHS  . arformoterol  15 mcg Nebulization BID  . bethanechol  5 mg Oral TID  . budesonide  0.5 mg Nebulization BID  . dicyclomine  10 mg Oral TID AC  . diphenoxylate-atropine  2 tablet Oral QID  . enoxaparin (LOVENOX) injection  30 mg Subcutaneous Q12H  . ertapenem  1 g Intravenous Q24H  . fluconazole  150 mg Oral Once  . gabapentin  300 mg Oral QHS  . methocarbamol  1,000 mg Oral TID  . pantoprazole  40 mg Oral Daily  . potassium chloride  40 mEq Oral TID  . [START ON 09/27/2014] predniSONE  10 mg Oral Q breakfast   Continuous Infusions:   Principal Problem:   Perforation of colon Active Problems:   Pneumoperitoneum   Dyspnea   Acute respiratory failure with hypoxia   Hypokalemia   Elevated troponin   Chronic diarrhea    Time spent: 15 minutes.    Vernell Leep, MD, FACP, FHM. [facility] Pager [phone]  If 7PM-7AM, please contact night-coverage [email or web address] Password [email or web address] 09/26/2014, 11:56 AM    LOS: 8 days

## 2014-09-26 NOTE — Progress Notes (Signed)
Pt called, gave her [facility] # to call tomorrow am if she had not heard from them by 1000.

## 2014-09-26 NOTE — Discharge Summary (Signed)
[facility] Discharge Summary   Patient ID: Alexis Olson MRN: [id number] DOB/AGE: [birth year] 81 y.o.  Admit date: 09/18/2014 Discharge date: 09/26/2014  Admitting Diagnosis: Free intra-abdominal air (pneumoperitoneum)  Perforated bowel Acute abdominal pain   Discharge Diagnosis Patient Active Problem List   Diagnosis Date Noted  . Perforation of colon 09/26/2014  . Dyspnea   . Acute respiratory failure with hypoxia   . Hypokalemia   . Elevated troponin   . Chronic diarrhea   . Pneumoperitoneum 09/18/2014  . Dysuria 03/06/2014  . Cough 02/10/2013  . Superficial phlebitis 12/27/2012  . Anemia   . OSTEOMYELITIS, VERTEBRA 05/05/2009  . [place] DISEASE, LUMBOSACRAL SPINE 04/22/2009  . VITAMIN B12 DEFICIENCY 07/09/2008  . DIVERTICULOSIS, COLON 07/09/2008  . COLONIC POLYPS, ADENOMATOUS, HX OF 07/09/2008  . URINARY URGENCY 09/26/2007  . ANXIETY 08/08/2007  . INSOMNIA 08/08/2007  . Bariatric surgery status 08/08/2007  . ALLERGIC RHINITIS 09/18/2006  . ASTHMA 09/18/2006    Consultants CCM - Dr. Thereasa Solo, Dr. Ancil Linsey Internal medicine - Dr. Tana Coast, Dr. Sherral Hammers, Dr. Algis Liming  Imaging: No results found.  Procedures Dr. Marlou Starks (09/18/14) - Exploratory laparotomy with repair of cecal perforation Dr. Olevia Perches (09/18/14) - Colonoscopy with polyp biopsy (polyp found to be benign)   Hospital Course:  81 year old female with a history of asthma, "intestinal bypass" around age 28 and a cholecystectomy presenting from Dr. Nichola Sizer office with worsening abdominal pain following a colonoscopy. Location is lower abdomen. Time pattern is constant. Severe in severity. Characterized as sharp pain. modifying factors include; levsin. No aggravating or alleviating factors. No modifying factors. Denies nausea or vomiting. Denies chills or sweats.  Her work up shows by AXR shows significant pneumoperitoneum. We have therefore been asked to evaluate.  Normal white count, K 3, normal renal  function.   Workup showed free air with likely bowel perforation.  Patient was admitted and underwent emergent procedure listed above.  Tolerated procedure well and was transferred to the SDU for close monitoring.  Foley was maintained until POD #2, but had to be replaced due to urinary retention.  Potassium was supplemented as needed.  Physical therapy was initiated.  Her wound was dressed BID with WD dressings.  She was maintained on Vancomycin and Invanz for intra-abdominal infection and RLL pneumonia.  She was started on a steroid taper.  She was managed in the ICU until 09/24/14 where she was doing well enough to transfer to the med surg floor (6N).  Bowel function soon returned, but was accompanied with a lot of abdominal bloating, cramping and copious diarrhea.  She normally has up to 6BM's per day secondary to her intestinal bypass for weight loss many years ago.  C.diff negative.  Eventually this stabilized.  Diet was advanced as tolerated.  On POD #8, the patient was voiding well, tolerating diet, ambulating well, pain well controlled, vital signs stable, midline wound clean with good granulation tissue (no significant drainage) and felt stable for discharge home.  Patient will follow up in our office in 2-3 weeks with Dr. Marlou Starks and knows to call with questions or concerns.  She will go home with DME [facility]in[facility], [facility] and RN to help with dressing changes (BID WD dressings).  She will follow up with Dr. Olevia Perches for post procedure check, and Dr. Gaynelle Arabian for urinary retention and possible voiding trial.  She will be maintained on urecholine until that time.  She will have 2 more days of steroid taper to finish (10mg  for 2 days starting  on 09/27/14).  She will finish a 10 day course of antibiotics for possible pneumonia (Augmentin for 3 more day).  She will need to follow up with her PCP for a repeat CXR in 4-6 weeks to ensure pneumonia has cleared.   Physical Exam: General:  Alert, NAD, pleasant,  comfortable Pulm:  CTA B/L, no W/R/R Card:  RRR, no M/G/R Abd:  Soft, ND, mild tenderness, midline wound clean without significant drainage, no foul smell.      Medication List    TAKE these medications        acetaminophen-codeine 300-15 MG per tablet  Commonly known as:  TYLENOL #2  TAKE 1 TABLET EVERY 4 HOURS AS NEEDED FOR PAIN     albuterol 108 (90 BASE) MCG/ACT inhaler  Commonly known as:  PROVENTIL HFA;VENTOLIN HFA  Inhale 1-2 puffs into the lungs every 6 (six) hours as needed for wheezing or shortness of breath.     ALPRAZolam 1 MG tablet  Commonly known as:  XANAX  Take 1 tablet (1 mg total) by mouth at bedtime as needed.     amitriptyline 100 MG tablet  Commonly known as:  ELAVIL  TAKE 1 TABLET AT BEDTIME     amoxicillin-clavulanate 875-125 MG per tablet  Commonly known as:  AUGMENTIN  Take 1 tablet by mouth 2 (two) times daily.     azelastine 0.05 % ophthalmic solution  Commonly known as:  OPTIVAR  Place 1 drop into both eyes daily as needed (dry eyes).     bethanechol 5 MG tablet  Commonly known as:  URECHOLINE  Take 1 tablet (5 mg total) by mouth 3 (three) times daily.     budesonide-formoterol 80-4.5 MCG/ACT inhaler  Commonly known as:  SYMBICORT  Inhale 2 puffs into the lungs 2 (two) times daily.     cetirizine 10 MG tablet  Commonly known as:  ZYRTEC  Take 10 mg by mouth daily.     cholecalciferol 1000 UNITS tablet  Commonly known as:  VITAMIN D  Take 1,000 Units by mouth daily.     colestipol 5 G granules  Commonly known as:  COLESTID  Take 5 g by mouth daily.     cyanocobalamin 1000 MCG/ML injection  Commonly known as:  (VITAMIN B-12)  Inject 1 mL (1,000 mcg total) into the muscle every 30 (thirty) days.     dicyclomine 10 MG capsule  Commonly known as:  BENTYL  Take 1 capsule (10 mg total) by mouth 3 (three) times daily.     diphenoxylate-atropine 2.5-0.025 MG per tablet  Commonly known as:  LOMOTIL  Take 2 tablets by mouth 2 (two)  times daily before a meal.     fluticasone 50 MCG/ACT nasal spray  Commonly known as:  FLONASE  2 sprays by Nasal route daily.     gabapentin 300 MG capsule  Commonly known as:  NEURONTIN  Take 300 mg by mouth at bedtime.     HEMOCYTE PLUS 106-1 MG Caps  Take 1 tablet by mouth 2 (two) times daily.     hydrOXYzine 25 MG tablet  Commonly known as:  ATARAX/VISTARIL  Take 25 mg by mouth 3 (three) times daily as needed for itching.     levocetirizine 5 MG tablet  Commonly known as:  XYZAL  Take 1 tablet (5 mg total) by mouth every evening.     MAG-OX 400 400 MG tablet  Generic drug:  magnesium oxide  Take 400 mg by mouth 2 (two) times daily.  methocarbamol 500 MG tablet  Commonly known as:  ROBAXIN  TAKE 1 TABLET (500 MG TOTAL) BY MOUTH 3 (THREE) TIMES DAILY AS NEEDED (MUSCLE SPASM).     montelukast 10 MG tablet  Commonly known as:  SINGULAIR  Take 10 mg by mouth at bedtime.     nystatin 100000 UNIT/ML suspension  Commonly known as:  MYCOSTATIN  TAKE 5 MLS (500,000 UNITS TOTAL) BY MOUTH 4 (FOUR) TIMES DAILY.     omeprazole 20 MG capsule  Commonly known as:  PRILOSEC  Take 1 capsule (20 mg total) by mouth daily.     oxyCODONE-acetaminophen 5-325 MG per tablet  Commonly known as:  PERCOCET/ROXICET  Take 1-2 tablets by mouth every 6 (six) hours as needed for moderate pain.     phenazopyridine 200 MG tablet  Commonly known as:  PYRIDIUM  Take 1 tablet (200 mg total) by mouth 3 (three) times daily as needed for pain.     potassium chloride SA 20 MEQ tablet  Commonly known as:  K-DUR,KLOR-CON  Take 1 tablet (20 mEq total) by mouth daily.     predniSONE 10 MG tablet  Commonly known as:  DELTASONE  Take 1 tablet (10 mg total) by mouth daily with breakfast.  Start taking on:  09/27/2014     PX ENTERIC ASPIRIN 81 MG EC tablet  Generic drug:  aspirin  Take 81 mg by mouth daily.     valACYclovir 500 MG tablet  Commonly known as:  VALTREX  Take 500 mg by mouth as  needed.     vitamin E 400 UNIT capsule  Take 400 Units by mouth daily.     zafirlukast 20 MG tablet  Commonly known as:  ACCOLATE  Take 20 mg by mouth daily as needed (for breathing). Take 1 by mouth twice a day     zolpidem 5 MG tablet  Commonly known as:  AMBIEN  Take 1 tablet (5 mg total) by mouth at bedtime.         Follow-up Information    Follow up with SIGMUND I Gaynelle Arabian, MD. Call in 1 week.   Specialty:  Urology   Why:  For post-hospital follow up, call the office to confirm date/time   Contact information:   [place] [address] [phone]       Follow up with Merrie Roof, MD. Call in 3 weeks.   Specialty:  General Surgery   Why:  For post-operation check, call the office to confirm date/time of appoinment.   Contact information:   [address] [place] [address] [phone]       Call Delfin Edis, MD.   Specialty:  Gastroenterology   Why:  As needed   Contact information:   [address] [place] [address] [phone]       Follow up with Gwendolyn Grant, MD.   Specialty:  Internal Medicine   Why:  For post-hospital follow up with your primary care doctor regarding getting a follow up chest xray in 4-6 weeks   Contact information:   [address] [place]  [address] [phone]       Signed: Vaughan Basta [facility] [phone]  09/26/2014, 9:16 AM

## 2014-09-26 NOTE — Discharge Instructions (Signed)
[facility]      [facility] °[phone] ° °OPEN ABDOMINAL SURGERY: POST OP INSTRUCTIONS ° °Always review your discharge instruction sheet given to you by the facility where your surgery was performed. ° °IF YOU HAVE DISABILITY OR FAMILY LEAVE FORMS, YOU MUST BRING THEM TO THE OFFICE FOR PROCESSING.  PLEASE DO NOT GIVE THEM TO YOUR DOCTOR. ° °1. A prescription for pain medication may be given to you upon discharge.  Take your pain medication as prescribed, if needed.  If narcotic pain medicine is not needed, then you may take acetaminophen (Tylenol) or ibuprofen (Advil) as needed. °2. Take your usually prescribed medications unless otherwise directed. °3. If you need a refill on your pain medication, please contact your pharmacy. They will contact our office to request authorization.  Prescriptions will not be filled after 5pm or on week-ends. °4. You should follow a light diet the first few days after arrival home, such as soup and crackers, pudding, etc.unless your doctor has advised otherwise. A high-fiber, low fat diet can be resumed as tolerated.   Be sure to include lots of fluids daily. Most patients will experience some swelling and bruising on the chest and neck area.  Ice packs will help.  Swelling and bruising can take several days to resolve °5. Most patients will experience some swelling and bruising in the area of the incision. Ice pack will help. Swelling and bruising can take several days to resolve..  °6. It is common to experience some constipation if taking pain medication after surgery.  Increasing fluid intake and taking a stool softener will usually help or prevent this problem from occurring.  A mild laxative (Milk of Magnesia or Miralax) should be taken according to package directions if there are no bowel movements after 48 hours. °7.  You may have steri-strips (small skin tapes) in place directly over the incision.  These strips should be left on the skin for 7-10 days.  If your  surgeon used skin glue on the incision, you may shower in 24 hours.  The glue will flake off over the next 2-3 weeks.  Any sutures or staples will be removed at the office during your follow-up visit. You may find that a light gauze bandage over your incision may keep your staples from being rubbed or pulled. You may shower and replace the bandage daily. °8. ACTIVITIES:  You may resume regular (light) daily activities beginning the next day--such as daily self-care, walking, climbing stairs--gradually increasing activities as tolerated.  You may have sexual intercourse when it is comfortable.  Refrain from any heavy lifting or straining until approved by your doctor. °a. You may drive when you no longer are taking prescription pain medication, you can comfortably wear a seatbelt, and you can safely maneuver your car and apply brakes °b. Return to Work: ___________________________________ °9. You should see your doctor in the office for a follow-up appointment approximately two weeks after your surgery.  Make sure that you call for this appointment within a day or two after you arrive home to insure a convenient appointment time. °OTHER INSTRUCTIONS:  °_____________________________________________________________ °_____________________________________________________________ ° °WHEN TO CALL YOUR DOCTOR: °1. Fever over 101.0 °2. Inability to urinate °3. Nausea and/or vomiting °4. Extreme swelling or bruising °5. Continued bleeding from incision. °6. Increased pain, redness, or drainage from the incision. °7. Difficulty swallowing or breathing °8. Muscle cramping or spasms. °9. Numbness or tingling in hands or feet or around lips. ° °The clinic staff is available to   answer your questions during regular business hours.  Please dont hesitate to call and ask to speak to one of the nurses if you have concerns.  For further questions, please visit [email or web address]  Acute Urinary Retention Acute urinary  retention is the temporary inability to urinate. This is an uncommon problem in women. It can be caused by:  Infection.  A side effect of a medicine.  A problem in a nearby organ that presses or squeezes on the bladder or the urethra (the tube that drains the bladder).  Psychological problems.   Surgery on your bladder, urethra, or pelvic organs that causes obstruction to the outflow of urine from your bladder. HOME CARE INSTRUCTIONS  If you are sent home with a Foley catheter and a drainage system, you will need to discuss the best course of action with your health care provider. While the catheter is in, maintain a good intake of fluids. Keep the drainage bag emptied and lower than your catheter. This is so that contaminated urine will not flow back into your bladder, which could lead to a urinary tract infection. There are two main types of drainage bags. One is a large bag that usually is used at night. It has a good capacity that will allow you to sleep through the night without having to empty it. The second type is called a leg bag. It has a smaller capacity so it needs to be emptied more frequently. However, the main advantage is that it can be attached by a leg strap and goes underneath your clothing, allowing you the freedom to move about or leave your home. Only take over-the-counter or prescription medicines for pain, discomfort, or fever as directed by your health care provider.  SEEK MEDICAL CARE IF:  You develop a low-grade fever.  You experience spasms or leakage of urine with the spasms. SEEK IMMEDIATE MEDICAL CARE IF:   You develop chills or fever.  Your catheter stops draining urine.  Your catheter falls out.  You start to develop increased bleeding that does not respond to rest and increased fluid intake. MAKE SURE YOU:  Understand these instructions.  Will watch your condition.  Will get help right away if you are not doing well or get worse. Document  Released: 03/12/2006 Document Revised: 01/01/2013 Document Reviewed: 08/22/2012 [facility] Patient Information 2015 [place], [place]. This information is not intended to replace advice given to you by your health care provider. Make sure you discuss any questions you have with your health care provider.

## 2014-09-26 NOTE — Progress Notes (Signed)
Called Sarah and left a message to verify [place] and [place] [facility], DME.

## 2014-09-26 NOTE — Progress Notes (Signed)
I did the abdominal wound dressing wet to dry, no active bleeding noted.

## 2014-09-27 DIAGNOSIS — R339 Retention of urine, unspecified: Secondary | ICD-10-CM | POA: Diagnosis not present

## 2014-09-27 DIAGNOSIS — D649 Anemia, unspecified: Secondary | ICD-10-CM | POA: Diagnosis not present

## 2014-09-27 DIAGNOSIS — Z8701 Personal history of pneumonia (recurrent): Secondary | ICD-10-CM | POA: Diagnosis not present

## 2014-09-27 DIAGNOSIS — J45909 Unspecified asthma, uncomplicated: Secondary | ICD-10-CM | POA: Diagnosis not present

## 2014-09-27 DIAGNOSIS — Z48815 Encounter for surgical aftercare following surgery on the digestive system: Secondary | ICD-10-CM | POA: Diagnosis not present

## 2014-09-28 DIAGNOSIS — Z8701 Personal history of pneumonia (recurrent): Secondary | ICD-10-CM | POA: Diagnosis not present

## 2014-09-28 DIAGNOSIS — J45909 Unspecified asthma, uncomplicated: Secondary | ICD-10-CM | POA: Diagnosis not present

## 2014-09-28 DIAGNOSIS — R339 Retention of urine, unspecified: Secondary | ICD-10-CM | POA: Diagnosis not present

## 2014-09-28 DIAGNOSIS — Z48815 Encounter for surgical aftercare following surgery on the digestive system: Secondary | ICD-10-CM | POA: Diagnosis not present

## 2014-09-28 DIAGNOSIS — D649 Anemia, unspecified: Secondary | ICD-10-CM | POA: Diagnosis not present

## 2014-09-29 ENCOUNTER — Other Ambulatory Visit: Payer: Self-pay | Admitting: Internal Medicine

## 2014-09-29 DIAGNOSIS — N3020 Other chronic cystitis without hematuria: Secondary | ICD-10-CM | POA: Diagnosis not present

## 2014-09-29 DIAGNOSIS — R339 Retention of urine, unspecified: Secondary | ICD-10-CM | POA: Diagnosis not present

## 2014-09-30 DIAGNOSIS — J45909 Unspecified asthma, uncomplicated: Secondary | ICD-10-CM | POA: Diagnosis not present

## 2014-09-30 DIAGNOSIS — Z8701 Personal history of pneumonia (recurrent): Secondary | ICD-10-CM | POA: Diagnosis not present

## 2014-09-30 DIAGNOSIS — R339 Retention of urine, unspecified: Secondary | ICD-10-CM | POA: Diagnosis not present

## 2014-09-30 DIAGNOSIS — Z48815 Encounter for surgical aftercare following surgery on the digestive system: Secondary | ICD-10-CM | POA: Diagnosis not present

## 2014-09-30 DIAGNOSIS — D649 Anemia, unspecified: Secondary | ICD-10-CM | POA: Diagnosis not present

## 2014-10-01 DIAGNOSIS — J45909 Unspecified asthma, uncomplicated: Secondary | ICD-10-CM | POA: Diagnosis not present

## 2014-10-01 DIAGNOSIS — Z48815 Encounter for surgical aftercare following surgery on the digestive system: Secondary | ICD-10-CM | POA: Diagnosis not present

## 2014-10-01 DIAGNOSIS — D649 Anemia, unspecified: Secondary | ICD-10-CM | POA: Diagnosis not present

## 2014-10-01 DIAGNOSIS — R339 Retention of urine, unspecified: Secondary | ICD-10-CM | POA: Diagnosis not present

## 2014-10-01 DIAGNOSIS — Z8701 Personal history of pneumonia (recurrent): Secondary | ICD-10-CM | POA: Diagnosis not present

## 2014-10-02 DIAGNOSIS — J45909 Unspecified asthma, uncomplicated: Secondary | ICD-10-CM | POA: Diagnosis not present

## 2014-10-02 DIAGNOSIS — Z48815 Encounter for surgical aftercare following surgery on the digestive system: Secondary | ICD-10-CM | POA: Diagnosis not present

## 2014-10-02 DIAGNOSIS — Z8701 Personal history of pneumonia (recurrent): Secondary | ICD-10-CM | POA: Diagnosis not present

## 2014-10-02 DIAGNOSIS — R339 Retention of urine, unspecified: Secondary | ICD-10-CM | POA: Diagnosis not present

## 2014-10-02 DIAGNOSIS — D649 Anemia, unspecified: Secondary | ICD-10-CM | POA: Diagnosis not present

## 2014-10-03 DIAGNOSIS — Z8701 Personal history of pneumonia (recurrent): Secondary | ICD-10-CM | POA: Diagnosis not present

## 2014-10-03 DIAGNOSIS — R339 Retention of urine, unspecified: Secondary | ICD-10-CM | POA: Diagnosis not present

## 2014-10-03 DIAGNOSIS — Z48815 Encounter for surgical aftercare following surgery on the digestive system: Secondary | ICD-10-CM | POA: Diagnosis not present

## 2014-10-03 DIAGNOSIS — J45909 Unspecified asthma, uncomplicated: Secondary | ICD-10-CM | POA: Diagnosis not present

## 2014-10-03 DIAGNOSIS — D649 Anemia, unspecified: Secondary | ICD-10-CM | POA: Diagnosis not present

## 2014-10-04 DIAGNOSIS — Z48815 Encounter for surgical aftercare following surgery on the digestive system: Secondary | ICD-10-CM | POA: Diagnosis not present

## 2014-10-04 DIAGNOSIS — D649 Anemia, unspecified: Secondary | ICD-10-CM | POA: Diagnosis not present

## 2014-10-04 DIAGNOSIS — R339 Retention of urine, unspecified: Secondary | ICD-10-CM | POA: Diagnosis not present

## 2014-10-04 DIAGNOSIS — Z8701 Personal history of pneumonia (recurrent): Secondary | ICD-10-CM | POA: Diagnosis not present

## 2014-10-04 DIAGNOSIS — J45909 Unspecified asthma, uncomplicated: Secondary | ICD-10-CM | POA: Diagnosis not present

## 2014-10-05 DIAGNOSIS — J45909 Unspecified asthma, uncomplicated: Secondary | ICD-10-CM | POA: Diagnosis not present

## 2014-10-05 DIAGNOSIS — D649 Anemia, unspecified: Secondary | ICD-10-CM | POA: Diagnosis not present

## 2014-10-05 DIAGNOSIS — R339 Retention of urine, unspecified: Secondary | ICD-10-CM | POA: Diagnosis not present

## 2014-10-05 DIAGNOSIS — Z48815 Encounter for surgical aftercare following surgery on the digestive system: Secondary | ICD-10-CM | POA: Diagnosis not present

## 2014-10-05 DIAGNOSIS — Z8701 Personal history of pneumonia (recurrent): Secondary | ICD-10-CM | POA: Diagnosis not present

## 2014-10-06 DIAGNOSIS — Z48815 Encounter for surgical aftercare following surgery on the digestive system: Secondary | ICD-10-CM | POA: Diagnosis not present

## 2014-10-06 DIAGNOSIS — Z8701 Personal history of pneumonia (recurrent): Secondary | ICD-10-CM | POA: Diagnosis not present

## 2014-10-06 DIAGNOSIS — D649 Anemia, unspecified: Secondary | ICD-10-CM | POA: Diagnosis not present

## 2014-10-06 DIAGNOSIS — R339 Retention of urine, unspecified: Secondary | ICD-10-CM | POA: Diagnosis not present

## 2014-10-06 DIAGNOSIS — J45909 Unspecified asthma, uncomplicated: Secondary | ICD-10-CM | POA: Diagnosis not present

## 2014-10-07 ENCOUNTER — Other Ambulatory Visit (INDEPENDENT_AMBULATORY_CARE_PROVIDER_SITE_OTHER): Payer: Commercial Managed Care - HMO

## 2014-10-07 ENCOUNTER — Ambulatory Visit (INDEPENDENT_AMBULATORY_CARE_PROVIDER_SITE_OTHER): Payer: Commercial Managed Care - HMO | Admitting: Internal Medicine

## 2014-10-07 ENCOUNTER — Encounter: Payer: Self-pay | Admitting: Internal Medicine

## 2014-10-07 ENCOUNTER — Ambulatory Visit (INDEPENDENT_AMBULATORY_CARE_PROVIDER_SITE_OTHER)
Admission: RE | Admit: 2014-10-07 | Discharge: 2014-10-07 | Disposition: A | Discharge status: Home / Self Care | Payer: Commercial Managed Care - HMO | Source: Ambulatory Visit | Attending: Internal Medicine | Admitting: Internal Medicine

## 2014-10-07 VITALS — BP 118/68 | HR 89 | Temp 98.2°F | Resp 18 | Wt 184.0 lb

## 2014-10-07 DIAGNOSIS — K631 Perforation of intestine (nontraumatic): Secondary | ICD-10-CM

## 2014-10-07 DIAGNOSIS — M25532 Pain in left wrist: Secondary | ICD-10-CM

## 2014-10-07 DIAGNOSIS — R339 Retention of urine, unspecified: Secondary | ICD-10-CM | POA: Diagnosis not present

## 2014-10-07 DIAGNOSIS — Y95 Nosocomial condition: Secondary | ICD-10-CM

## 2014-10-07 DIAGNOSIS — R194 Change in bowel habit: Secondary | ICD-10-CM

## 2014-10-07 DIAGNOSIS — Z09 Encounter for follow-up examination after completed treatment for conditions other than malignant neoplasm: Secondary | ICD-10-CM | POA: Diagnosis not present

## 2014-10-07 DIAGNOSIS — Z8701 Personal history of pneumonia (recurrent): Secondary | ICD-10-CM | POA: Diagnosis not present

## 2014-10-07 DIAGNOSIS — Z48815 Encounter for surgical aftercare following surgery on the digestive system: Secondary | ICD-10-CM | POA: Diagnosis not present

## 2014-10-07 DIAGNOSIS — D649 Anemia, unspecified: Secondary | ICD-10-CM | POA: Diagnosis not present

## 2014-10-07 DIAGNOSIS — J189 Pneumonia, unspecified organism: Secondary | ICD-10-CM | POA: Diagnosis not present

## 2014-10-07 DIAGNOSIS — Z9889 Other specified postprocedural states: Secondary | ICD-10-CM | POA: Diagnosis not present

## 2014-10-07 DIAGNOSIS — J45909 Unspecified asthma, uncomplicated: Secondary | ICD-10-CM | POA: Diagnosis not present

## 2014-10-07 LAB — URIC ACID: Uric Acid, Serum: 4.1 mg/dL (ref 2.4–7.0)

## 2014-10-07 NOTE — Assessment & Plan Note (Signed)
Probiotic trial  

## 2014-10-07 NOTE — Progress Notes (Signed)
   Subjective:    Patient ID: Alexis Olson, female    DOB: 1933/06/10, 81 y.o.   MRN: [id number]  HPI   She was hospitalized 6/24-09/26/14 for perforation of the bowel following polypectomy and colonoscopy. The polyp proved to be benign. The cecal perforation was oversewn by general surgery. Hospital course was complicated by right lower lobe pneumonia and urinary retention. She has continued Urecholine following discharge.   Since she's been home the abdominal discomfort is much better and typically only appears when she strains with bowel movements. The operative site is being dressed twice a day.  She does have some sweats and generalized weakness. Stool is dark but she is on iron.   Her  significant past history includes intestinal bypass surgery. Since that time she's had frequent loose-watery stools. She uses Lomotil and Bentyl on an as-needed basis  4 days ago she awoke with pain and swelling of the left wrist. She started oral prednisone 40 mg daily and rapidly reduced it over several days. The symptoms resolved. She has female relatives with a history of gout ; she has no personal history of such.  Review of Systems She denies fever or chills.  She's not having hoarseness, dysphagia, significant dyspepsia, or rectal bleeding.   She has continued to use incentive spirometry. She denies significant cough or sputum production.    Objective:   Physical Exam Pertinent or positive findings include: She appears weak but adequately nourished. She has an  isolated severely carious left upper tooth. S4 gallop is suggested. She has bilateral rales mainly at the bases without increased work of breathing. Abdomen is dressed. No purulent drainage is noted on the dressing. Bowel sounds are decreased. The right dorsalis pedis pulse is decreased. She has trace left ankle edema. She has mixed arthritic changes with flexion contracture of the fifth finger of the right hand.  General appearance :in no  distress. Eyes: No conjunctival inflammation or scleral icterus is present. Oral exam:  Lips and gums are healthy appearing.There is no oropharyngeal erythema or exudate noted.  Heart:  Normal rate and regular rhythm. S1 and S2 normal without murmur, click, rub or other extra sounds   Abdomen:  soft and non-tender without masses, organomegaly or hernias noted.  No guarding or rebound.  Vascular :  no bruits present. Skin:Warm & dry.  Intact without suspicious lesions or rashes ; no tenting or jaundice  Lymphatic: No lymphadenopathy is noted about the head, neck, axilla Neuro: Strength, tone decreased.       Assessment & Plan:  See Current Assessment & Plan in Problem List under specific Diagnosis

## 2014-10-07 NOTE — Assessment & Plan Note (Signed)
CXRay

## 2014-10-07 NOTE — Patient Instructions (Signed)
  Your next office appointment will be determined based upon review of your pending  xrays  Those written interpretation of the lab results and instructions will be transmitted to you by mail for your records.  Critical results will be called.   Followup as needed for any active or acute issue. Please report any significant change in your symptoms.   Please take a probiotic , Florastor OR Align, every day if the bowels are loose. This will replace the normal bacteria which  are necessary for formation of normal stool and processing of food.

## 2014-10-07 NOTE — Progress Notes (Signed)
Pre visit review using our clinic review tool, if applicable. No additional management support is needed unless otherwise documented below in the visit note. 

## 2014-10-08 DIAGNOSIS — Z9889 Other specified postprocedural states: Secondary | ICD-10-CM | POA: Diagnosis not present

## 2014-10-09 DIAGNOSIS — J45909 Unspecified asthma, uncomplicated: Secondary | ICD-10-CM | POA: Diagnosis not present

## 2014-10-09 DIAGNOSIS — R339 Retention of urine, unspecified: Secondary | ICD-10-CM | POA: Diagnosis not present

## 2014-10-09 DIAGNOSIS — D649 Anemia, unspecified: Secondary | ICD-10-CM | POA: Diagnosis not present

## 2014-10-09 DIAGNOSIS — Z8701 Personal history of pneumonia (recurrent): Secondary | ICD-10-CM | POA: Diagnosis not present

## 2014-10-09 DIAGNOSIS — Z48815 Encounter for surgical aftercare following surgery on the digestive system: Secondary | ICD-10-CM | POA: Diagnosis not present

## 2014-10-12 ENCOUNTER — Telehealth: Payer: Self-pay | Admitting: Internal Medicine

## 2014-10-12 DIAGNOSIS — J45909 Unspecified asthma, uncomplicated: Secondary | ICD-10-CM | POA: Diagnosis not present

## 2014-10-12 DIAGNOSIS — R339 Retention of urine, unspecified: Secondary | ICD-10-CM | POA: Diagnosis not present

## 2014-10-12 DIAGNOSIS — D649 Anemia, unspecified: Secondary | ICD-10-CM | POA: Diagnosis not present

## 2014-10-12 DIAGNOSIS — Z8701 Personal history of pneumonia (recurrent): Secondary | ICD-10-CM | POA: Diagnosis not present

## 2014-10-12 DIAGNOSIS — Z48815 Encounter for surgical aftercare following surgery on the digestive system: Secondary | ICD-10-CM | POA: Diagnosis not present

## 2014-10-13 DIAGNOSIS — J45909 Unspecified asthma, uncomplicated: Secondary | ICD-10-CM | POA: Diagnosis not present

## 2014-10-13 DIAGNOSIS — R339 Retention of urine, unspecified: Secondary | ICD-10-CM | POA: Diagnosis not present

## 2014-10-13 DIAGNOSIS — D649 Anemia, unspecified: Secondary | ICD-10-CM | POA: Diagnosis not present

## 2014-10-13 DIAGNOSIS — Z48815 Encounter for surgical aftercare following surgery on the digestive system: Secondary | ICD-10-CM | POA: Diagnosis not present

## 2014-10-13 DIAGNOSIS — Z8701 Personal history of pneumonia (recurrent): Secondary | ICD-10-CM | POA: Diagnosis not present

## 2014-10-13 MED ORDER — DIPHENOXYLATE-ATROPINE 2.5-0.025 MG PO TABS: 2.0000 | ORAL_TABLET | Freq: Three times a day (TID) | ORAL | Status: DC | PRN

## 2014-10-13 NOTE — Telephone Encounter (Signed)
Patient requesting a refill on Lomotil for a 90 day supply, directions on medication is for patient to take 1 tablet twice daily but patient has been taking medication 2 twice daily. Please advise on directions.

## 2014-10-13 NOTE — Telephone Encounter (Signed)
Spoke with patient. OK to refill Lomotil #150, 2 po tid prn severe diarrhea

## 2014-10-13 NOTE — Telephone Encounter (Signed)
Medication refilled

## 2014-10-14 DIAGNOSIS — R339 Retention of urine, unspecified: Secondary | ICD-10-CM | POA: Diagnosis not present

## 2014-10-14 DIAGNOSIS — Z48815 Encounter for surgical aftercare following surgery on the digestive system: Secondary | ICD-10-CM | POA: Diagnosis not present

## 2014-10-14 DIAGNOSIS — D649 Anemia, unspecified: Secondary | ICD-10-CM | POA: Diagnosis not present

## 2014-10-14 DIAGNOSIS — Z8701 Personal history of pneumonia (recurrent): Secondary | ICD-10-CM | POA: Diagnosis not present

## 2014-10-14 DIAGNOSIS — J45909 Unspecified asthma, uncomplicated: Secondary | ICD-10-CM | POA: Diagnosis not present

## 2014-10-15 DIAGNOSIS — R339 Retention of urine, unspecified: Secondary | ICD-10-CM | POA: Diagnosis not present

## 2014-10-15 DIAGNOSIS — Z48815 Encounter for surgical aftercare following surgery on the digestive system: Secondary | ICD-10-CM | POA: Diagnosis not present

## 2014-10-15 DIAGNOSIS — D649 Anemia, unspecified: Secondary | ICD-10-CM | POA: Diagnosis not present

## 2014-10-15 DIAGNOSIS — Z8701 Personal history of pneumonia (recurrent): Secondary | ICD-10-CM | POA: Diagnosis not present

## 2014-10-15 DIAGNOSIS — J45909 Unspecified asthma, uncomplicated: Secondary | ICD-10-CM | POA: Diagnosis not present

## 2014-10-16 ENCOUNTER — Telehealth: Payer: Self-pay | Admitting: Internal Medicine

## 2014-10-16 NOTE — Telephone Encounter (Signed)
error 

## 2014-10-17 DIAGNOSIS — Z8701 Personal history of pneumonia (recurrent): Secondary | ICD-10-CM | POA: Diagnosis not present

## 2014-10-17 DIAGNOSIS — R339 Retention of urine, unspecified: Secondary | ICD-10-CM | POA: Diagnosis not present

## 2014-10-17 DIAGNOSIS — J45909 Unspecified asthma, uncomplicated: Secondary | ICD-10-CM | POA: Diagnosis not present

## 2014-10-17 DIAGNOSIS — Z48815 Encounter for surgical aftercare following surgery on the digestive system: Secondary | ICD-10-CM | POA: Diagnosis not present

## 2014-10-17 DIAGNOSIS — D649 Anemia, unspecified: Secondary | ICD-10-CM | POA: Diagnosis not present

## 2014-10-20 DIAGNOSIS — Z8701 Personal history of pneumonia (recurrent): Secondary | ICD-10-CM | POA: Diagnosis not present

## 2014-10-20 DIAGNOSIS — Z48815 Encounter for surgical aftercare following surgery on the digestive system: Secondary | ICD-10-CM | POA: Diagnosis not present

## 2014-10-20 DIAGNOSIS — R339 Retention of urine, unspecified: Secondary | ICD-10-CM | POA: Diagnosis not present

## 2014-10-20 DIAGNOSIS — J45909 Unspecified asthma, uncomplicated: Secondary | ICD-10-CM | POA: Diagnosis not present

## 2014-10-20 DIAGNOSIS — D649 Anemia, unspecified: Secondary | ICD-10-CM | POA: Diagnosis not present

## 2014-10-21 ENCOUNTER — Telehealth: Payer: Self-pay | Admitting: Internal Medicine

## 2014-10-21 DIAGNOSIS — Z48815 Encounter for surgical aftercare following surgery on the digestive system: Secondary | ICD-10-CM | POA: Diagnosis not present

## 2014-10-21 DIAGNOSIS — Z8701 Personal history of pneumonia (recurrent): Secondary | ICD-10-CM | POA: Diagnosis not present

## 2014-10-21 DIAGNOSIS — R339 Retention of urine, unspecified: Secondary | ICD-10-CM | POA: Diagnosis not present

## 2014-10-21 DIAGNOSIS — D649 Anemia, unspecified: Secondary | ICD-10-CM | POA: Diagnosis not present

## 2014-10-21 DIAGNOSIS — J45909 Unspecified asthma, uncomplicated: Secondary | ICD-10-CM | POA: Diagnosis not present

## 2014-10-21 NOTE — Telephone Encounter (Signed)
Because of the chronic nature of this issue; films should be made of the left wrist. I did not see any in the computer. If the symptoms persist and the films are negative; orthopedic referral would be indicated.

## 2014-10-21 NOTE — Telephone Encounter (Signed)
Patient was in to see Dr. Linna Darner a couple weeks ago for a hospital follow up. Her wrist is swelling and is warm to touch. The doctor at the hospital gave her some prednisone and it worked really well. She is wondering if she can get another prescription for it.  Pharmacy is [facility]  Please call her on her cell [phone]

## 2014-10-21 NOTE — Telephone Encounter (Signed)
Please advise 

## 2014-10-22 ENCOUNTER — Other Ambulatory Visit: Payer: Self-pay | Admitting: Internal Medicine

## 2014-10-22 DIAGNOSIS — M25532 Pain in left wrist: Secondary | ICD-10-CM

## 2014-10-22 NOTE — Telephone Encounter (Signed)
Order entered

## 2014-10-22 NOTE — Telephone Encounter (Signed)
Spoke with pt to inform her of the message. Please advise on what orders you want put in for x-ray

## 2014-10-23 DIAGNOSIS — Z8701 Personal history of pneumonia (recurrent): Secondary | ICD-10-CM | POA: Diagnosis not present

## 2014-10-23 DIAGNOSIS — Z48815 Encounter for surgical aftercare following surgery on the digestive system: Secondary | ICD-10-CM | POA: Diagnosis not present

## 2014-10-23 DIAGNOSIS — D649 Anemia, unspecified: Secondary | ICD-10-CM | POA: Diagnosis not present

## 2014-10-23 DIAGNOSIS — R339 Retention of urine, unspecified: Secondary | ICD-10-CM | POA: Diagnosis not present

## 2014-10-23 DIAGNOSIS — J45909 Unspecified asthma, uncomplicated: Secondary | ICD-10-CM | POA: Diagnosis not present

## 2014-10-26 DIAGNOSIS — Z48815 Encounter for surgical aftercare following surgery on the digestive system: Secondary | ICD-10-CM | POA: Diagnosis not present

## 2014-10-26 DIAGNOSIS — R339 Retention of urine, unspecified: Secondary | ICD-10-CM | POA: Diagnosis not present

## 2014-10-26 DIAGNOSIS — D649 Anemia, unspecified: Secondary | ICD-10-CM | POA: Diagnosis not present

## 2014-10-26 DIAGNOSIS — Z8701 Personal history of pneumonia (recurrent): Secondary | ICD-10-CM | POA: Diagnosis not present

## 2014-10-26 DIAGNOSIS — J45909 Unspecified asthma, uncomplicated: Secondary | ICD-10-CM | POA: Diagnosis not present

## 2014-10-28 ENCOUNTER — Other Ambulatory Visit: Payer: Self-pay | Admitting: Internal Medicine

## 2014-10-28 ENCOUNTER — Telehealth: Payer: Self-pay

## 2014-10-28 DIAGNOSIS — Z48815 Encounter for surgical aftercare following surgery on the digestive system: Secondary | ICD-10-CM | POA: Diagnosis not present

## 2014-10-28 DIAGNOSIS — Z8701 Personal history of pneumonia (recurrent): Secondary | ICD-10-CM | POA: Diagnosis not present

## 2014-10-28 DIAGNOSIS — D649 Anemia, unspecified: Secondary | ICD-10-CM | POA: Diagnosis not present

## 2014-10-28 DIAGNOSIS — R339 Retention of urine, unspecified: Secondary | ICD-10-CM | POA: Diagnosis not present

## 2014-10-28 DIAGNOSIS — J45909 Unspecified asthma, uncomplicated: Secondary | ICD-10-CM | POA: Diagnosis not present

## 2014-10-28 NOTE — Telephone Encounter (Signed)
[facility] sent rx refill request for bethanechol 5 mg tablet.   Per PCP rx is prescribed by Urology and should be addressed by them if treatment is needed ongoing.  Faxed information to pharmacy to send rx request to urology.

## 2014-10-30 ENCOUNTER — Telehealth: Payer: Self-pay | Admitting: Emergency Medicine

## 2014-10-30 DIAGNOSIS — Z8701 Personal history of pneumonia (recurrent): Secondary | ICD-10-CM | POA: Diagnosis not present

## 2014-10-30 DIAGNOSIS — J45909 Unspecified asthma, uncomplicated: Secondary | ICD-10-CM | POA: Diagnosis not present

## 2014-10-30 DIAGNOSIS — R339 Retention of urine, unspecified: Secondary | ICD-10-CM | POA: Diagnosis not present

## 2014-10-30 DIAGNOSIS — Z48815 Encounter for surgical aftercare following surgery on the digestive system: Secondary | ICD-10-CM | POA: Diagnosis not present

## 2014-10-30 DIAGNOSIS — D649 Anemia, unspecified: Secondary | ICD-10-CM | POA: Diagnosis not present

## 2014-10-30 NOTE — Telephone Encounter (Signed)
Spoke with pt, she stated she went to an outside dentist for the tooth pain she discussed with Dr Linna Darner. She is unsure of why they only X-rayed a few teeth and not everything. I instructed pt to follow-up with her regular dentist when he got back in town.

## 2014-11-02 DIAGNOSIS — Z48815 Encounter for surgical aftercare following surgery on the digestive system: Secondary | ICD-10-CM | POA: Diagnosis not present

## 2014-11-02 DIAGNOSIS — D649 Anemia, unspecified: Secondary | ICD-10-CM | POA: Diagnosis not present

## 2014-11-02 DIAGNOSIS — J45909 Unspecified asthma, uncomplicated: Secondary | ICD-10-CM | POA: Diagnosis not present

## 2014-11-02 DIAGNOSIS — Z8701 Personal history of pneumonia (recurrent): Secondary | ICD-10-CM | POA: Diagnosis not present

## 2014-11-02 DIAGNOSIS — R339 Retention of urine, unspecified: Secondary | ICD-10-CM | POA: Diagnosis not present

## 2014-11-04 DIAGNOSIS — J45909 Unspecified asthma, uncomplicated: Secondary | ICD-10-CM | POA: Diagnosis not present

## 2014-11-04 DIAGNOSIS — Z48815 Encounter for surgical aftercare following surgery on the digestive system: Secondary | ICD-10-CM | POA: Diagnosis not present

## 2014-11-04 DIAGNOSIS — R339 Retention of urine, unspecified: Secondary | ICD-10-CM | POA: Diagnosis not present

## 2014-11-04 DIAGNOSIS — Z8701 Personal history of pneumonia (recurrent): Secondary | ICD-10-CM | POA: Diagnosis not present

## 2014-11-04 DIAGNOSIS — D649 Anemia, unspecified: Secondary | ICD-10-CM | POA: Diagnosis not present

## 2014-11-06 DIAGNOSIS — J45909 Unspecified asthma, uncomplicated: Secondary | ICD-10-CM | POA: Diagnosis not present

## 2014-11-06 DIAGNOSIS — D649 Anemia, unspecified: Secondary | ICD-10-CM | POA: Diagnosis not present

## 2014-11-06 DIAGNOSIS — Z48815 Encounter for surgical aftercare following surgery on the digestive system: Secondary | ICD-10-CM | POA: Diagnosis not present

## 2014-11-06 DIAGNOSIS — R339 Retention of urine, unspecified: Secondary | ICD-10-CM | POA: Diagnosis not present

## 2014-11-06 DIAGNOSIS — Z8701 Personal history of pneumonia (recurrent): Secondary | ICD-10-CM | POA: Diagnosis not present

## 2014-11-09 DIAGNOSIS — Z48815 Encounter for surgical aftercare following surgery on the digestive system: Secondary | ICD-10-CM | POA: Diagnosis not present

## 2014-11-09 DIAGNOSIS — D649 Anemia, unspecified: Secondary | ICD-10-CM | POA: Diagnosis not present

## 2014-11-09 DIAGNOSIS — J45909 Unspecified asthma, uncomplicated: Secondary | ICD-10-CM | POA: Diagnosis not present

## 2014-11-09 DIAGNOSIS — R339 Retention of urine, unspecified: Secondary | ICD-10-CM | POA: Diagnosis not present

## 2014-11-09 DIAGNOSIS — Z8701 Personal history of pneumonia (recurrent): Secondary | ICD-10-CM | POA: Diagnosis not present

## 2014-11-11 ENCOUNTER — Telehealth: Payer: Self-pay | Admitting: Emergency Medicine

## 2014-11-11 NOTE — Telephone Encounter (Signed)
Spoke with Dr Orland Mustard from [facility]. She stated that pt has been seen in her office for tooth decay. She stated that pt declined having a full exam and full x-ray for her mouth. Dr Orland Mustard stated that they took two x-rays of her teeth, with findings of tooth decay and multiple crowns needed. Pt refused treatment due to the recent hospital visit after a colonoscopy. Pt has since called their office multiple times per week being very mean toward her staff and stated that someone from our office said that their office must be a bunch of "quacks" to only take two x-rays. I tried to call pt to follow-up and she stated that she did not have time to talk because she was going to an appt with a surgeon and would call back.

## 2014-11-12 NOTE — Telephone Encounter (Signed)
Alexis Olson from pts dentist office calling you back

## 2014-11-12 NOTE — Telephone Encounter (Signed)
Patient has returned phone call.  You can reach at [phone]

## 2014-11-12 NOTE — Telephone Encounter (Signed)
Spoke with pt, she stated she did not know why [facility] was so concerned. I instructed pt to follow-up with her Primary Dentist rather than going back to the Dentist that she was not pleased with.

## 2014-11-13 DIAGNOSIS — J45909 Unspecified asthma, uncomplicated: Secondary | ICD-10-CM | POA: Diagnosis not present

## 2014-11-13 DIAGNOSIS — R339 Retention of urine, unspecified: Secondary | ICD-10-CM | POA: Diagnosis not present

## 2014-11-13 DIAGNOSIS — Z8701 Personal history of pneumonia (recurrent): Secondary | ICD-10-CM | POA: Diagnosis not present

## 2014-11-13 DIAGNOSIS — D649 Anemia, unspecified: Secondary | ICD-10-CM | POA: Diagnosis not present

## 2014-11-13 DIAGNOSIS — Z48815 Encounter for surgical aftercare following surgery on the digestive system: Secondary | ICD-10-CM | POA: Diagnosis not present

## 2014-11-17 ENCOUNTER — Other Ambulatory Visit: Payer: Self-pay | Admitting: Internal Medicine

## 2014-11-23 ENCOUNTER — Other Ambulatory Visit: Payer: Self-pay | Admitting: Internal Medicine

## 2014-11-23 NOTE — Telephone Encounter (Signed)
OK to refill

## 2014-11-23 NOTE — Telephone Encounter (Signed)
Dr Henrene Pastor (DOD)- Patient requests refills of B12 (previous Dr Olevia Perches patient). May I refill?

## 2014-11-24 ENCOUNTER — Other Ambulatory Visit: Payer: Self-pay | Admitting: Emergency Medicine

## 2014-11-24 ENCOUNTER — Encounter: Payer: Self-pay | Admitting: Internal Medicine

## 2014-11-24 ENCOUNTER — Ambulatory Visit (INDEPENDENT_AMBULATORY_CARE_PROVIDER_SITE_OTHER): Payer: Commercial Managed Care - HMO | Admitting: Internal Medicine

## 2014-11-24 ENCOUNTER — Ambulatory Visit (INDEPENDENT_AMBULATORY_CARE_PROVIDER_SITE_OTHER)
Admission: RE | Admit: 2014-11-24 | Discharge: 2014-11-24 | Disposition: A | Discharge status: Home / Self Care | Payer: Commercial Managed Care - HMO | Source: Ambulatory Visit | Attending: Internal Medicine | Admitting: Internal Medicine

## 2014-11-24 ENCOUNTER — Other Ambulatory Visit (INDEPENDENT_AMBULATORY_CARE_PROVIDER_SITE_OTHER): Payer: Commercial Managed Care - HMO

## 2014-11-24 VITALS — BP 128/76 | HR 92 | Temp 97.9°F | Resp 18 | Wt 185.0 lb

## 2014-11-24 DIAGNOSIS — R609 Edema, unspecified: Secondary | ICD-10-CM | POA: Diagnosis not present

## 2014-11-24 DIAGNOSIS — E8809 Other disorders of plasma-protein metabolism, not elsewhere classified: Secondary | ICD-10-CM

## 2014-11-24 DIAGNOSIS — R062 Wheezing: Secondary | ICD-10-CM

## 2014-11-24 DIAGNOSIS — J45909 Unspecified asthma, uncomplicated: Secondary | ICD-10-CM | POA: Diagnosis not present

## 2014-11-24 LAB — BASIC METABOLIC PANEL
BUN: 15 mg/dL (ref 6–23)
CO2: 28 mEq/L (ref 19–32)
Calcium: 8.8 mg/dL (ref 8.4–10.5)
Chloride: 105 mEq/L (ref 96–112)
Creatinine, Ser: 0.5 mg/dL (ref 0.40–1.20)
GFR: 126.31 mL/min (ref >60.00)
Glucose, Bld: 90 mg/dL (ref 70–99)
Potassium: 3.6 mEq/L (ref 3.5–5.1)
Sodium: 139 mEq/L (ref 135–145)

## 2014-11-24 LAB — HEPATIC FUNCTION PANEL
ALT: 10 U/L (ref 0–35)
AST: 14 U/L (ref 0–37)
Albumin: 3.9 g/dL (ref 3.5–5.2)
Alkaline Phosphatase: 41 U/L (ref 39–117)
Bilirubin, Direct: 0 mg/dL (ref 0.0–0.3)
Total Bilirubin: 0.3 mg/dL (ref 0.2–1.2)
Total Protein: 6.8 g/dL (ref 6.0–8.3)

## 2014-11-24 LAB — BRAIN NATRIURETIC PEPTIDE: Pro B Natriuretic peptide (BNP): 42 pg/mL (ref 0.0–100.0)

## 2014-11-24 MED ORDER — GABAPENTIN 300 MG PO CAPS: 300.0000 mg | ORAL_CAPSULE | Freq: Every day | ORAL | Status: DC

## 2014-11-24 NOTE — Progress Notes (Signed)
   Subjective:    Patient ID: Alexis Olson, female    DOB: [birth year], 81 y.o.   MRN: [id number]  HPI She describes chronic edema in her  lower extremities despite wearing compression hose. She states walking has helped this.  She's also treated edema with elevation and ice.  The varicose veins had been evaluated by Dr. Marja Kays.  In early July her renal function and hepatic function were normal. Albumin was reduced at 2.5. She has been taking protein supplements orally as well as yogurt containing protein.  Her major concern is that one night 2 weeks ago her husband noted her to be wheezing at night. She is a retired [employer] scientist  & was concerned that this would be a sign of heart failure. She mentioned this statement twice.   Review of Systems  Chest pain, palpitations, tachycardia, exertional dyspnea, paroxysmal nocturnal dyspnea, or claudication  are absent.  No associated fever, chills, sweats, weight loss present.  She denies hemoptysis or calf pain.      Objective:   Physical Exam Pertinent or positive findings include: No neck vein distention at 10 is present. She has no hepatojugular reflux. 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 She has profound venous varicosities in the lower extremities, greater on the left than the right. These empty rapidly  & completely with leg elevation. The pedal pulses are good. She has mild lipedema and trace edema which is non pitting.   General appearance :adequately nourished; in no distress.  Eyes: No conjunctival inflammation or scleral icterus is present.  Oral exam:  Lips and gums are healthy appearing.There is no oropharyngeal erythema or exudate noted. Dental hygiene is good.  Heart:  Normal rate and regular rhythm. S1 and S2 normal without gallop, murmur, click, rub or other extra sounds    Lungs:Chest clear to auscultation; no wheezes, rhonchi,rales ,or rubs present.No increased work  of breathing.   Abdomen: bowel sounds normal, soft and non-tender without masses, organomegaly or hernias noted.  No guarding or rebound.   Vascular : all pulses equal ; no bruits present.  Skin:Warm & dry.  Intact without suspicious lesions or rashes ; no tenting or jaundice   Lymphatic: No lymphadenopathy is noted about the head, neck, axilla.   Neuro: Strength, tone & DTRs normal.       Assessment & Plan:  #1 peripheral edema due to #2 and #3  #2 marked venous insufficiency  #3 low albumin  #4 subjective wheezing; she's concerned about heart failure  Plan: See orders and recommendations

## 2014-11-24 NOTE — Progress Notes (Signed)
Pre visit review using our clinic review tool, if applicable. No additional management support is needed unless otherwise documented below in the visit note. 

## 2014-11-24 NOTE — Patient Instructions (Signed)
  Your next office appointment will be determined based upon review of your pending labs  and  xrays  Those written interpretation of the lab results and instructions will be transmitted to you by mail for your records.  Critical results will be called.   Followup as needed for any active or acute issue. Please report any significant change in your symptoms. 

## 2014-12-02 ENCOUNTER — Other Ambulatory Visit: Payer: Self-pay

## 2014-12-02 MED ORDER — ALPRAZOLAM 1 MG PO TABS: 1.0000 mg | ORAL_TABLET | Freq: Every evening | ORAL | Status: DC | PRN

## 2014-12-02 MED ORDER — GABAPENTIN 300 MG PO CAPS: 300.0000 mg | ORAL_CAPSULE | Freq: Every day | ORAL | Status: DC

## 2014-12-02 MED ORDER — ZOLPIDEM TARTRATE 5 MG PO TABS: 5.0000 mg | ORAL_TABLET | Freq: Every day | ORAL | Status: DC

## 2014-12-11 ENCOUNTER — Telehealth: Payer: Self-pay | Admitting: Internal Medicine

## 2014-12-11 MED ORDER — GABAPENTIN 300 MG PO CAPS: 300.0000 mg | ORAL_CAPSULE | Freq: Every day | ORAL | Status: DC

## 2014-12-11 NOTE — Telephone Encounter (Signed)
Patient is requesting that the gabapentin (NEURONTIN) 300 MG capsule [[id number]] be sent to [facility] outpatient pharmacy

## 2014-12-11 NOTE — Telephone Encounter (Signed)
Resent rx to [facility] outpatient pharmacy../lmb 

## 2014-12-31 DIAGNOSIS — Z1231 Encounter for screening mammogram for malignant neoplasm of breast: Secondary | ICD-10-CM | POA: Diagnosis not present

## 2014-12-31 LAB — HM MAMMOGRAPHY

## 2015-01-04 ENCOUNTER — Telehealth: Payer: Self-pay

## 2015-01-04 MED ORDER — DICYCLOMINE HCL 10 MG PO CAPS: 10.0000 mg | ORAL_CAPSULE | Freq: Three times a day (TID) | ORAL | Status: DC

## 2015-01-04 NOTE — Telephone Encounter (Signed)
Patient returned phone call. She refuses to schedule appointment with one of the new physicians as of right now.

## 2015-01-04 NOTE — Telephone Encounter (Signed)
Called patient to inform her that we received a fax from her pharmacy requesting a refill for her Bentyl. Informed patient Dr. Olevia Perches has retired and we need her to pick another physician here to see. Told patient we have two new physicians her that are experienced physicians if she wants to continue her care with one of them. Pt states she is unsure and will call back to schedule. Medication sent to pharmacy for a 3 month supply. Informed patient that will only get a 3 month supply until she established with another doctor. Pt verbalized understanding.

## 2015-02-05 ENCOUNTER — Ambulatory Visit (INDEPENDENT_AMBULATORY_CARE_PROVIDER_SITE_OTHER): Payer: Commercial Managed Care - HMO | Admitting: Family

## 2015-02-05 ENCOUNTER — Ambulatory Visit (INDEPENDENT_AMBULATORY_CARE_PROVIDER_SITE_OTHER)
Admission: RE | Admit: 2015-02-05 | Discharge: 2015-02-05 | Disposition: A | Discharge status: Home / Self Care | Payer: Commercial Managed Care - HMO | Source: Ambulatory Visit | Attending: Family | Admitting: Family

## 2015-02-05 ENCOUNTER — Encounter: Payer: Self-pay | Admitting: Family

## 2015-02-05 ENCOUNTER — Telehealth: Payer: Self-pay | Admitting: Internal Medicine

## 2015-02-05 VITALS — BP 128/78 | HR 95 | Temp 98.0°F | Resp 16 | Ht 65.0 in | Wt 190.8 lb

## 2015-02-05 DIAGNOSIS — R079 Chest pain, unspecified: Secondary | ICD-10-CM | POA: Insufficient documentation

## 2015-02-05 DIAGNOSIS — W19XXXA Unspecified fall, initial encounter: Secondary | ICD-10-CM | POA: Insufficient documentation

## 2015-02-05 DIAGNOSIS — R0789 Other chest pain: Secondary | ICD-10-CM | POA: Diagnosis not present

## 2015-02-05 MED ORDER — METHOCARBAMOL 500 MG PO TABS: 500.0000 mg | ORAL_TABLET | Freq: Four times a day (QID) | ORAL | Status: DC | PRN

## 2015-02-05 MED ORDER — ACETAMINOPHEN-CODEINE #3 300-30 MG PO TABS
1.0000 | ORAL_TABLET | Freq: Three times a day (TID) | ORAL | Status: DC | PRN
Start: 2015-02-05 — End: 2015-10-05

## 2015-02-05 NOTE — Progress Notes (Signed)
Subjective:    Patient ID: Alexis Olson, female    DOB: 05-16-1933, 81 y.o.   MRN: [id number]  Chief Complaint  Patient presents with  . Fall    had a fall on monday, fell going into the bathroom and hit her left side on the toilet, hurts to breathe and is very sore getting up and down    HPI:  Alexis Olson is a 81 y.o. female who  has a past medical history of Obesity; Lumbosacral disc disease; ALLERGIC RHINITIS; ANEMIA, CHRONIC; ANXIETY; ASTHMA; Bariatric surgery status; COLONIC POLYPS, ADENOMATOUS, HX OF (2010); DIVERTICULOSIS, COLON; INSOMNIA; URINARY URGENCY; VITAMIN B12 DEFICIENCY; Hypotension; Hypertension; and Colon perforation ([place]). and presents today for an acute office visit.   Associated symptoms of pain on her left side causing her discomfort with breathing and getting up and down has been going on for about 4 days following a going from the hardwood floor to the bathroom floor tripping on the transition in flooring. Denies any head trauma or LOC.  Modifying factors include previous prescriptions for robaxin and pain medication which seemed to help with the discomfort. Denies any blood in urine or other pains.   Allergies  Allergen Reactions  . Other Anaphylaxis    Red peppers--choking  . Azithromycin Other (See Comments)    Can't move joints  . Benadryl [Diphenhydramine Hcl] Other (See Comments)    Paradoxical response.  . Ciprofloxacin Hcl     REACTION: causes yeast inf  and refuses to take  . Codeine Phosphate     REACTION: unspecified  . Hydrocodone Itching and Nausea And Vomiting  . Hydrocodone-Homatropine Itching and Nausea And Vomiting    REACTION: unspecified  . Levaquin [Levofloxacin In D5w] Other (See Comments)    Muscle soreness  . Meperidine Hcl   . Sulfa Antibiotics Other (See Comments)    Joint pain  . Ultram [Tramadol Hcl] Other (See Comments)    Can't move joints     Current Outpatient Prescriptions on File Prior to Visit  Medication Sig  Dispense Refill  . albuterol (PROVENTIL HFA;VENTOLIN HFA) 108 (90 BASE) MCG/ACT inhaler Inhale 1-2 puffs into the lungs every 6 (six) hours as needed for wheezing or shortness of breath.    . ALPRAZolam (XANAX) 1 MG tablet Take 1 tablet (1 mg total) by mouth at bedtime as needed. 30 tablet 5  . amitriptyline (ELAVIL) 100 MG tablet TAKE 1 TABLET AT BEDTIME 90 tablet 1  . amoxicillin-clavulanate (AUGMENTIN) 875-125 MG per tablet Take 1 tablet by mouth 2 (two) times daily. 6 tablet 0  . azelastine (OPTIVAR) 0.05 % ophthalmic solution Place 1 drop into both eyes daily as needed (dry eyes).     . bethanechol (URECHOLINE) 5 MG tablet Take 1 tablet (5 mg total) by mouth 3 (three) times daily. 30 tablet 0  . budesonide-formoterol (SYMBICORT) 80-4.5 MCG/ACT inhaler Inhale 2 puffs into the lungs 2 (two) times daily. 1 Inhaler 12  . cetirizine (ZYRTEC) 10 MG tablet Take 10 mg by mouth daily.     . cholecalciferol (VITAMIN D) 1000 UNITS tablet Take 1,000 Units by mouth daily.    . colestipol (COLESTID) 5 G granules Take 5 g by mouth daily. 500 g 12  . cyanocobalamin (,VITAMIN B-12,) 1000 MCG/ML injection INJECT 1 ML (1,000 MCG TOTAL) INTO THE MUSCLE EVERY 30 (THIRTY) DAYS. 6 mL 0  . dicyclomine (BENTYL) 10 MG capsule Take 1 capsule (10 mg total) by mouth 3 (three) times daily. 270 capsule 0  .  diphenoxylate-atropine (LOMOTIL) 2.5-0.025 MG per tablet Take 2 tablets by mouth 3 (three) times daily as needed for diarrhea or loose stools. 150 tablet 0  . Fe Fum-FA-B Cmp-C-Zn-Mg-Mn-Cu (HEMOCYTE PLUS) 106-1 MG CAPS TAKE 1 CAPSULE BY MOUTH TWICE DAILY 60 each 1  . fluticasone (FLONASE) 50 MCG/ACT nasal spray 2 sprays by Nasal route daily.      Marland Kitchen gabapentin (NEURONTIN) 300 MG capsule Take 1 capsule (300 mg total) by mouth at bedtime. 90 capsule 3  . hydrOXYzine (ATARAX) 25 MG tablet Take 25 mg by mouth 3 (three) times daily as needed for itching.     . levocetirizine (XYZAL) 5 MG tablet Take 1 tablet (5 mg total) by  mouth every evening. 90 tablet 3  . magnesium oxide (MAG-OX 400) 400 MG tablet Take 400 mg by mouth 2 (two) times daily.     . montelukast (SINGULAIR) 10 MG tablet Take 10 mg by mouth at bedtime.     Marland Kitchen nystatin (MYCOSTATIN) 100000 UNIT/ML suspension TAKE 5 MLS (500,000 UNITS TOTAL) BY MOUTH 4 (FOUR) TIMES DAILY. 200 mL 0  . omeprazole (PRILOSEC) 20 MG capsule Take 1 capsule (20 mg total) by mouth daily. 30 capsule 10  . oxyCODONE-acetaminophen (PERCOCET/ROXICET) 5-325 MG per tablet Take 1-2 tablets by mouth every 6 (six) hours as needed for moderate pain. 40 tablet 0  . phenazopyridine (PYRIDIUM) 200 MG tablet Take 1 tablet (200 mg total) by mouth 3 (three) times daily as needed for pain. 30 tablet 1  . potassium chloride SA (K-DUR,KLOR-CON) 20 MEQ tablet Take 1 tablet (20 mEq total) by mouth daily. 90 tablet 3  . PX ENTERIC ASPIRIN 81 MG EC tablet Take 81 mg by mouth daily.     . valACYclovir (VALTREX) 500 MG tablet Take 500 mg by mouth as needed.      . vitamin E 400 UNIT capsule Take 400 Units by mouth daily.      . zafirlukast (ACCOLATE) 20 MG tablet Take 20 mg by mouth daily as needed (for breathing). Take 1 by mouth twice a day    . zolpidem (AMBIEN) 5 MG tablet Take 1 tablet (5 mg total) by mouth at bedtime. 30 tablet 5   No current facility-administered medications on file prior to visit.    Past Medical History  Diagnosis Date  . Obesity   . Lumbosacral disc disease     Chronic pain; History of osteomyelitis 2010 following ESI complication  . ALLERGIC RHINITIS   . 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 ANXIETY   . ASTHMA   . Bariatric surgery status   . COLONIC POLYPS, ADENOMATOUS, HX OF 2010  . DIVERTICULOSIS, COLON   . INSOMNIA   . URINARY URGENCY   . VITAMIN B12 DEFICIENCY   . Hypotension     pt denies  . Hypertension   . Colon perforation ([place])     Review of Systems  Constitutional: Negative for fever and chills.    Gastrointestinal: Negative for nausea, vomiting, abdominal pain, diarrhea, constipation, blood in stool, abdominal distention, anal bleeding and rectal pain.  Musculoskeletal:       Positive for left side chest pain      Objective:    BP 128/78 mmHg  Pulse 95  Temp(Src) 98 F (36.7 C) (Oral)  Resp 16  Ht 5\' 5"  (1.651 m)  Wt 190 lb 12.8 oz (86.546 kg)  BMI 31.75 kg/m2  SpO2 95% Nursing note and  vital signs reviewed.  Physical Exam  Constitutional: She is oriented to person, place, and time. She appears well-developed and well-nourished. No distress.  Cardiovascular: Normal rate, regular rhythm, normal heart sounds and intact distal pulses.   Pulmonary/Chest: Effort normal and breath sounds normal.  Abdominal: Soft. Bowel sounds are normal. She exhibits no distension and no mass. There is no tenderness. There is no rebound and no guarding.  Midline abdominal incision noted with good approximation and healing.  Musculoskeletal:  Left side - no obvious deformity, discoloration, or edema noted. Mild tenderness along the lower rib cage. No crepitus noted.  Neurological: She is alert and oriented to person, place, and time.  Skin: Skin is warm and dry.  Psychiatric: She has a normal mood and affect. Her behavior is normal. Judgment and thought content normal.       Assessment & Plan:   Problem List Items Addressed This Visit      Other   Fall   Left sided chest pain - Primary    Symptoms and exam consistent with left-sided contusion however cannot rule out underlying fracture. Obtain x-rays to rule out fracture. Continue Tylenol 3 as needed for discomfort and Robaxin. Continued deep breathing and coughing periodically to prevent pneumonia. Splinting as needed. Follow-up if symptoms worsen or fail to improve.      Relevant Medications   acetaminophen-codeine (TYLENOL #3) 300-30 MG tablet   methocarbamol (ROBAXIN) 500 MG tablet   Other Relevant Orders   DG Ribs Unilateral Left  (Completed)

## 2015-02-05 NOTE — Assessment & Plan Note (Signed)
Symptoms and exam consistent with left-sided contusion however cannot rule out underlying fracture. Obtain x-rays to rule out fracture. Continue Tylenol 3 as needed for discomfort and Robaxin. Continued deep breathing and coughing periodically to prevent pneumonia. Splinting as needed. Follow-up if symptoms worsen or fail to improve.

## 2015-02-05 NOTE — Telephone Encounter (Signed)
Is requesting to transfer from [place] to [place].  Please advise.

## 2015-02-05 NOTE — Telephone Encounter (Signed)
Unfortunately, I'm not able to accept any more new patients at this time.  I'm sorry! Thank you!  

## 2015-02-05 NOTE — Patient Instructions (Signed)
Thank you for choosing [facility].  Summary/Instructions:  Please stop by radiology for an x-ray.  Your prescription(s) have been submitted to your pharmacy or been printed and provided for you. Please take as directed and contact our office if you believe you are having problem(s) with the medication(s) or have any questions.  If your symptoms worsen or fail to improve, please contact our office for further instruction, or in case of emergency go directly to the emergency room at the closest medical facility.   Please work on coughing and deep breathing.

## 2015-02-05 NOTE — Telephone Encounter (Signed)
Patient is requesting a referral to Dr. Ebony Hail @ asthma/ allergy. She has an appt on 02/11/2015.

## 2015-02-05 NOTE — Progress Notes (Signed)
Pre visit review using our clinic review tool, if applicable. No additional management support is needed unless otherwise documented below in the visit note. 

## 2015-02-08 ENCOUNTER — Telehealth: Payer: Self-pay | Admitting: Internal Medicine

## 2015-02-08 NOTE — Telephone Encounter (Signed)
Pt call make sure the referral will be done before her appt. Please help and call pt back to let her know.

## 2015-02-08 NOTE — Telephone Encounter (Signed)
Please advise 

## 2015-02-08 NOTE — Telephone Encounter (Signed)
Pt called need result of the x-ray that was done last week. Please give her a call back  Phone # [phone]

## 2015-02-08 NOTE — Telephone Encounter (Signed)
Please inform patient that her x-rays were negative, therefore this is likely a bruise which will take time to heal as we discussed.

## 2015-02-09 NOTE — Telephone Encounter (Signed)
Pt aware of results 

## 2015-02-09 NOTE — Telephone Encounter (Signed)
Left patient vm with MD response and to call back to schedule with one of the providers taking patients.

## 2015-02-10 NOTE — Telephone Encounter (Signed)
Left msg on vm to make pt aware that referral has been completed.

## 2015-02-11 DIAGNOSIS — J3089 Other allergic rhinitis: Secondary | ICD-10-CM | POA: Diagnosis not present

## 2015-02-11 DIAGNOSIS — J4530 Mild persistent asthma, uncomplicated: Secondary | ICD-10-CM | POA: Diagnosis not present

## 2015-02-11 DIAGNOSIS — J301 Allergic rhinitis due to pollen: Secondary | ICD-10-CM | POA: Diagnosis not present

## 2015-02-11 DIAGNOSIS — J3081 Allergic rhinitis due to animal (cat) (dog) hair and dander: Secondary | ICD-10-CM | POA: Diagnosis not present

## 2015-02-12 ENCOUNTER — Other Ambulatory Visit: Payer: Self-pay | Admitting: Internal Medicine

## 2015-02-12 ENCOUNTER — Encounter: Payer: Self-pay | Admitting: Internal Medicine

## 2015-02-15 ENCOUNTER — Telehealth: Payer: Self-pay | Admitting: Internal Medicine

## 2015-02-15 ENCOUNTER — Other Ambulatory Visit: Payer: Self-pay | Admitting: Internal Medicine

## 2015-02-15 NOTE — Telephone Encounter (Signed)
Faxed script back to [facility].../lmb 

## 2015-02-15 NOTE — Telephone Encounter (Signed)
I am not sure of what this means. The x-rays were negative. What other concerns does she have?

## 2015-02-15 NOTE — Telephone Encounter (Signed)
Alexis Olson is concerned about her rib cage.  Believes that x ray on her side may need to be different.  Can you please follow up.

## 2015-02-15 NOTE — Telephone Encounter (Signed)
Please advise 

## 2015-02-15 NOTE — Telephone Encounter (Signed)
She states she will just give it some more time and call back if it does not get better.

## 2015-02-15 NOTE — Telephone Encounter (Signed)
Faxed script back to [facility].../lmb 

## 2015-02-22 ENCOUNTER — Other Ambulatory Visit (INDEPENDENT_AMBULATORY_CARE_PROVIDER_SITE_OTHER): Payer: Commercial Managed Care - HMO

## 2015-02-22 ENCOUNTER — Ambulatory Visit (INDEPENDENT_AMBULATORY_CARE_PROVIDER_SITE_OTHER)
Admission: RE | Admit: 2015-02-22 | Discharge: 2015-02-22 | Disposition: A | Discharge status: Home / Self Care | Payer: Commercial Managed Care - HMO | Source: Ambulatory Visit | Attending: Internal Medicine | Admitting: Internal Medicine

## 2015-02-22 ENCOUNTER — Ambulatory Visit (INDEPENDENT_AMBULATORY_CARE_PROVIDER_SITE_OTHER): Payer: Commercial Managed Care - HMO | Admitting: Internal Medicine

## 2015-02-22 ENCOUNTER — Encounter: Payer: Self-pay | Admitting: Internal Medicine

## 2015-02-22 VITALS — BP 128/70 | HR 77 | Temp 98.2°F | Ht 65.0 in | Wt 190.0 lb

## 2015-02-22 DIAGNOSIS — K631 Perforation of intestine (nontraumatic): Secondary | ICD-10-CM | POA: Diagnosis not present

## 2015-02-22 DIAGNOSIS — Z0000 Encounter for general adult medical examination without abnormal findings: Secondary | ICD-10-CM

## 2015-02-22 DIAGNOSIS — R5383 Other fatigue: Secondary | ICD-10-CM

## 2015-02-22 DIAGNOSIS — F411 Generalized anxiety disorder: Secondary | ICD-10-CM

## 2015-02-22 DIAGNOSIS — M899 Disorder of bone, unspecified: Secondary | ICD-10-CM

## 2015-02-22 DIAGNOSIS — M8580 Other specified disorders of bone density and structure, unspecified site: Secondary | ICD-10-CM

## 2015-02-22 DIAGNOSIS — M810 Age-related osteoporosis without current pathological fracture: Secondary | ICD-10-CM | POA: Insufficient documentation

## 2015-02-22 LAB — CBC WITH DIFFERENTIAL/PLATELET
Basophils Absolute: 0 10*3/uL (ref 0.0–0.1)
Basophils Relative: 0.5 % (ref 0.0–3.0)
Eosinophils Absolute: 0.3 10*3/uL (ref 0.0–0.7)
Eosinophils Relative: 3.1 % (ref 0.0–5.0)
HCT: 33.8 % — ABNORMAL LOW (ref 36.0–46.0)
Hemoglobin: 11.2 g/dL — ABNORMAL LOW (ref 12.0–15.0)
Lymphocytes Relative: 38 % (ref 12.0–46.0)
Lymphs Abs: 3.3 10*3/uL (ref 0.7–4.0)
MCHC: 33.3 g/dL (ref 30.0–36.0)
MCV: 95.9 fl (ref 78.0–100.0)
Monocytes Absolute: 0.7 10*3/uL (ref 0.1–1.0)
Monocytes Relative: 8.5 % (ref 3.0–12.0)
Neutro Abs: 4.4 10*3/uL (ref 1.4–7.7)
Neutrophils Relative %: 49.9 % (ref 43.0–77.0)
Platelets: 283 10*3/uL (ref 150.0–400.0)
RBC: 3.52 Mil/uL — ABNORMAL LOW (ref 3.87–5.11)
RDW: 14 % (ref 11.5–15.5)
WBC: 8.8 10*3/uL (ref 4.0–10.5)

## 2015-02-22 LAB — HEPATIC FUNCTION PANEL
ALT: 11 U/L (ref 0–35)
AST: 13 U/L (ref 0–37)
Albumin: 4 g/dL (ref 3.5–5.2)
Alkaline Phosphatase: 51 U/L (ref 39–117)
Bilirubin, Direct: 0.1 mg/dL (ref 0.0–0.3)
Total Bilirubin: 0.4 mg/dL (ref 0.2–1.2)
Total Protein: 6.8 g/dL (ref 6.0–8.3)

## 2015-02-22 LAB — BASIC METABOLIC PANEL
BUN: 19 mg/dL (ref 6–23)
CO2: 27 mEq/L (ref 19–32)
Calcium: 9 mg/dL (ref 8.4–10.5)
Chloride: 102 mEq/L (ref 96–112)
Creatinine, Ser: 0.55 mg/dL (ref 0.40–1.20)
GFR: 113.09 mL/min (ref >60.00)
Glucose, Bld: 82 mg/dL (ref 70–99)
Potassium: 3.7 mEq/L (ref 3.5–5.1)
Sodium: 137 mEq/L (ref 135–145)

## 2015-02-22 LAB — TSH: TSH: 1.08 u[IU]/mL (ref 0.35–4.50)

## 2015-02-22 MED ORDER — METHOCARBAMOL 500 MG PO TABS: 500.0000 mg | ORAL_TABLET | Freq: Four times a day (QID) | ORAL | Status: DC | PRN

## 2015-02-22 NOTE — Assessment & Plan Note (Signed)
Long history of same, complicated with panic attacks also overlap with chronic insomnia Uses Xanax as needed - refill today

## 2015-02-22 NOTE — Patient Instructions (Addendum)
It was good to see you today.  We have reviewed your prior records including labs and tests today  Health Maintenance reviewed - all recommended immunizations and age-appropriate screenings are up-to-date.  Test(s) ordered today. Your results will be released to [facility] (or called to you) after review, usually within 72hours after test completion. If any changes need to be made, you will be notified at that same time.  Medications reviewed and updated, no changes recommended at this time. Refill on medication(s) as discussed today. (Robaxin)  we'll make referral for bone density (DEXA).   Please schedule followup in 6 months, call sooner if problems.  Health Maintenance, Female Adopting a healthy lifestyle and getting preventive care can go a long way to promote health and wellness. Talk with your health care provider about what schedule of regular examinations is right for you. This is a good chance for you to check in with your provider about disease prevention and staying healthy. In between checkups, there are plenty of things you can do on your own. Experts have done a lot of research about which lifestyle changes and preventive measures are most likely to keep you healthy. Ask your health care provider for more information. WEIGHT AND DIET  Eat a healthy diet  Be sure to include plenty of vegetables, fruits, low-fat dairy products, and lean protein.  Do not eat a lot of foods high in solid fats, added sugars, or salt.  Get regular exercise. This is one of the most important things you can do for your health.  Most adults should exercise for at least 150 minutes each week. The exercise should increase your heart rate and make you sweat (moderate-intensity exercise).  Most adults should also do strengthening exercises at least twice a week. This is in addition to the moderate-intensity exercise.  Maintain a healthy weight  Body mass index (BMI) is a measurement that can be used  to identify possible weight problems. It estimates body fat based on height and weight. Your health care provider can help determine your BMI and help you achieve or maintain a healthy weight.  For females 79 years of age and older:   A BMI below 18.5 is considered underweight.  A BMI of 18.5 to 24.9 is normal.  A BMI of 25 to 29.9 is considered overweight.  A BMI of 30 and above is considered obese.  Watch levels of cholesterol and blood lipids  You should start having your blood tested for lipids and cholesterol at 79 years of age, then have this test every 5 years.  You may need to have your cholesterol levels checked more often if:  Your lipid or cholesterol levels are high.  You are older than 79 years of age.  You are at high risk for heart disease.  CANCER SCREENING   Lung Cancer  Lung cancer screening is recommended for adults 79-26 years old who are at high risk for lung cancer because of a history of smoking.  A yearly low-dose CT scan of the lungs is recommended for people who:  Currently smoke.  Have quit within the past 15 years.  Have at least a 30-pack-year history of smoking. A pack year is smoking an average of one pack of cigarettes a day for 1 year.  Yearly screening should continue until it has been 15 years since you quit.  Yearly screening should stop if you develop a health problem that would prevent you from having lung cancer treatment.  Breast Cancer  Practice breast self-awareness. This means understanding how your breasts normally appear and feel.  It also means doing regular breast self-exams. Let your health care provider know about any changes, no matter how small.  If you are in your 79s or 30s, you should have a clinical breast exam (CBE) by a health care provider every 1-3 years as part of a regular health exam.  If you are 90 or older, have a CBE every year. Also consider having a breast X-ray (mammogram) every year.  If you  have a family history of breast cancer, talk to your health care provider about genetic screening.  If you are at high risk for breast cancer, talk to your health care provider about having an MRI and a mammogram every year.  Breast cancer gene (BRCA) assessment is recommended for women who have family members with BRCA-related cancers. BRCA-related cancers include:  Breast.  Ovarian.  Tubal.  Peritoneal cancers.  Results of the assessment will determine the need for genetic counseling and BRCA1 and BRCA2 testing. Cervical Cancer Your health care provider may recommend that you be screened regularly for cancer of the pelvic organs (ovaries, uterus, and vagina). This screening involves a pelvic examination, including checking for microscopic changes to the surface of your cervix (Pap test). You may be encouraged to have this screening done every 3 years, beginning at age 43.  For women ages 4-65, health care providers may recommend pelvic exams and Pap testing every 3 years, or they may recommend the Pap and pelvic exam, combined with testing for human papilloma virus (HPV), every 5 years. Some types of HPV increase your risk of cervical cancer. Testing for HPV may also be done on women of any age with unclear Pap test results.  Other health care providers may not recommend any screening for nonpregnant women who are considered low risk for pelvic cancer and who do not have symptoms. Ask your health care provider if a screening pelvic exam is right for you.  If you have had past treatment for cervical cancer or a condition that could lead to cancer, you need Pap tests and screening for cancer for at least 20 years after your treatment. If Pap tests have been discontinued, your risk factors (such as having a new sexual partner) need to be reassessed to determine if screening should resume. Some women have medical problems that increase the chance of getting cervical cancer. In these cases, your  health care provider may recommend more frequent screening and Pap tests. Colorectal Cancer  This type of cancer can be detected and often prevented.  Routine colorectal cancer screening usually begins at 79 years of age and continues through 79 years of age.  Your health care provider may recommend screening at an earlier age if you have risk factors for colon cancer.  Your health care provider may also recommend using home test kits to check for hidden blood in the stool.  A small camera at the end of a tube can be used to examine your colon directly (sigmoidoscopy or colonoscopy). This is done to check for the earliest forms of colorectal cancer.  Routine screening usually begins at age 29.  Direct examination of the colon should be repeated every 5-10 years through 79 years of age. However, you may need to be screened more often if early forms of precancerous polyps or small growths are found. Skin Cancer  Check your skin from head to toe regularly.  Tell your health care provider about any  moles or changes in moles, especially if there is a change in a mole's shape or color.  Also tell your health care provider if you have a mole that is larger than the size of a pencil eraser.  Always use sunscreen. Apply sunscreen liberally and repeatedly throughout the day.  Protect yourself by wearing long sleeves, pants, a wide-brimmed hat, and sunglasses whenever you are outside. HEART DISEASE, DIABETES, AND HIGH BLOOD PRESSURE   High blood pressure causes heart disease and increases the risk of stroke. High blood pressure is more likely to develop in:  People who have blood pressure in the high end of the normal range (130-139/85-89 mm Hg).  People who are overweight or obese.  People who are African American.  If you are 18-39 years of age, have your blood pressure checked every 3-5 years. If you are 40 years of age or older, have your blood pressure checked every year. You  should have your blood pressure measured twice--once when you are at a hospital or clinic, and once when you are not at a hospital or clinic. Record the average of the two measurements. To check your blood pressure when you are not at a hospital or clinic, you can use:  An automated blood pressure machine at a pharmacy.  A home blood pressure monitor.  If you are between 55 years and 79 years old, ask your health care provider if you should take aspirin to prevent strokes.  Have regular diabetes screenings. This involves taking a blood sample to check your fasting blood sugar level.  If you are at a normal weight and have a low risk for diabetes, have this test once every three years after 79 years of age.  If you are overweight and have a high risk for diabetes, consider being tested at a younger age or more often. PREVENTING INFECTION  Hepatitis B  If you have a higher risk for hepatitis B, you should be screened for this virus. You are considered at high risk for hepatitis B if:  You were born in a country where hepatitis B is common. Ask your health care provider which countries are considered high risk.  Your parents were born in a high-risk country, and you have not been immunized against hepatitis B (hepatitis B vaccine).  You have HIV or AIDS.  You use needles to inject street drugs.  You live with someone who has hepatitis B.  You have had sex with someone who has hepatitis B.  You get hemodialysis treatment.  You take certain medicines for conditions, including cancer, organ transplantation, and autoimmune conditions. Hepatitis C  Blood testing is recommended for:  Everyone born from 1945 through 1965.  Anyone with known risk factors for hepatitis C. Sexually transmitted infections (STIs)  You should be screened for sexually transmitted infections (STIs) including gonorrhea and chlamydia if:  You are sexually active and are younger than 79 years of age.  You  are older than 79 years of age and your health care provider tells you that you are at risk for this type of infection.  Your sexual activity has changed since you were last screened and you are at an increased risk for chlamydia or gonorrhea. Ask your health care provider if you are at risk.  If you do not have HIV, but are at risk, it may be recommended that you take a prescription medicine daily to prevent HIV infection. This is called pre-exposure prophylaxis (PrEP). You are considered at risk if:    You are sexually active and do not regularly use condoms or know the HIV status of your partner(s).  You take drugs by injection.  You are sexually active with a partner who has HIV. Talk with your health care provider about whether you are at high risk of being infected with HIV. If you choose to begin PrEP, you should first be tested for HIV. You should then be tested every 3 months for as long as you are taking PrEP.  PREGNANCY   If you are premenopausal and you may become pregnant, ask your health care provider about preconception counseling.  If you may become pregnant, take 400 to 800 micrograms (mcg) of folic acid every day.  If you want to prevent pregnancy, talk to your health care provider about birth control (contraception). OSTEOPOROSIS AND MENOPAUSE   Osteoporosis is a disease in which the bones lose minerals and strength with aging. This can result in serious bone fractures. Your risk for osteoporosis can be identified using a bone density scan.  If you are 65 years of age or older, or if you are at risk for osteoporosis and fractures, ask your health care provider if you should be screened.  Ask your health care provider whether you should take a calcium or vitamin D supplement to lower your risk for osteoporosis.  Menopause may have certain physical symptoms and risks.  Hormone replacement therapy may reduce some of these symptoms and risks. Talk to your health care  provider about whether hormone replacement therapy is right for you.  HOME CARE INSTRUCTIONS   Schedule regular health, dental, and eye exams.  Stay current with your immunizations.   Do not use any tobacco products including cigarettes, chewing tobacco, or electronic cigarettes.  If you are pregnant, do not drink alcohol.  If you are breastfeeding, limit how much and how often you drink alcohol.  Limit alcohol intake to no more than 1 drink per day for nonpregnant women. One drink equals 12 ounces of beer, 5 ounces of wine, or 1 ounces of hard liquor.  Do not use street drugs.  Do not share needles.  Ask your health care provider for help if you need support or information about quitting drugs.  Tell your health care provider if you often feel depressed.  Tell your health care provider if you have ever been abused or do not feel safe at home.   This information is not intended to replace advice given to you by your health care provider. Make sure you discuss any questions you have with your health care provider.   Document Released: 09/26/2010 Document Revised: 04/03/2014 Document Reviewed: 02/12/2013 Elsevier Interactive Patient Education 2016 Elsevier Inc.     

## 2015-02-22 NOTE — Progress Notes (Signed)
Subjective:    Patient ID: Alexis Olson, female    DOB: 1933/11/06, 81 y.o.   MRN: [id number]  HPI   Here for medicare wellness  Diet: heart healthy  Physical activity: sedentary Depression/mood screen: negative Hearing: intact to whispered voice Visual acuity: grossly normal, performs annual eye exam  ADLs: capable Fall risk: reviewed Home safety: good Cognitive evaluation: intact to orientation, naming, recall and repetition EOL planning: adv directives, full code/ I agree  I have personally reviewed and have noted 1. The patient's medical and social history 2. Their use of alcohol, tobacco or illicit drugs 3. Their current medications and supplements 4. The patient's functional ability including ADL's, fall risks, home safety risks and hearing or visual impairment. 5. Diet and physical activities 6. Evidence for depression or mood disorders  Also reviewed chronic medical conditions, interval events and current concerns   Past Medical History  Diagnosis Date  . Obesity   . Lumbosacral disc disease     Chronic pain; History of osteomyelitis 2010 following ESI complication  . ALLERGIC RHINITIS   . 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 ANXIETY   . ASTHMA   . Bariatric surgery status   . COLONIC POLYPS, ADENOMATOUS, HX OF 2010  . DIVERTICULOSIS, COLON   . INSOMNIA   . URINARY URGENCY   . VITAMIN B12 DEFICIENCY   . Hypertension   . Colon perforation ([place]) 08/2014    following polypetomy during colo, surgical repair   Family History  Problem Relation Age of Onset  . Cervical cancer Mother   . Liver disease Sister   . Kidney disease Sister   . Cirrhosis Sister   . Colon cancer Neg Hx   . Esophageal cancer Neg Hx   . Rectal cancer Neg Hx   . Stomach cancer Neg Hx    Social History  Substance Use Topics  . Smoking status: Former Research scientist (life sciences)  . Smokeless tobacco: Never Used  . Alcohol Use: No    Review of Systems    Constitutional: Positive for fatigue. Negative for unexpected weight change.  Respiratory: Negative for cough, shortness of breath and wheezing.   Cardiovascular: Negative for chest pain, palpitations and leg swelling.  Gastrointestinal: Negative for nausea, abdominal pain and diarrhea.  Neurological: Negative for dizziness, weakness, light-headedness and headaches.  Psychiatric/Behavioral: Negative for dysphoric mood. The patient is not nervous/anxious.   All other systems reviewed and are negative.   Patient Care Team: Rowe Clack, MD as PCP - General (Internal Medicine) Lafayette Dragon, MD (Gastroenterology) Rana Snare, MD (Urology) Paula Compton, MD (Obstetrics and Gynecology) Belva Crome, MD (Cardiology) Drema Dallas, MD (Physical Medicine and Rehabilitation) Mosetta Anis, MD (Allergy) Suella Broad, MD (Physical Medicine and Rehabilitation) Katy Apo, MD (Ophthalmology)     Objective:    Physical Exam  Constitutional: She is oriented to person, place, and time. She appears well-developed and well-nourished. No distress.  obese  Neck: Normal range of motion. Neck supple. No tracheal deviation present. No thyromegaly present.  Cardiovascular: Normal rate, regular rhythm and normal heart sounds.   No murmur heard. Pulmonary/Chest: Effort normal and breath sounds normal. No respiratory distress.  Abdominal: Soft. Bowel sounds are normal. She exhibits no distension and no mass. There is no tenderness. There is no guarding.  Musculoskeletal: She exhibits no edema.  Neurological: She is alert and oriented to person, place, and time. No cranial nerve deficit. Coordination normal.  Skin: Skin is warm and dry. No rash noted. No erythema. No pallor.  Psychiatric: She has a normal mood and affect. Her behavior is normal. Judgment and thought content normal.  Vitals reviewed.   BP 128/70 mmHg  Pulse 77  Temp(Src) 98.2 F (36.8 C) (Oral)  Ht 5\' 5"  (1.651  m)  Wt 190 lb (86.183 kg)  BMI 31.62 kg/m2  SpO2 97% Wt Readings from Last 3 Encounters:  02/22/15 190 lb (86.183 kg)  02/05/15 190 lb 12.8 oz (86.546 kg)  11/24/14 185 lb (83.915 kg)    Lab Results  Component Value Date   WBC 8.5 09/25/2014   HGB 10.4* 09/25/2014   HCT 32.4* 09/25/2014   PLT 264 09/25/2014   GLUCOSE 90 11/24/2014   CHOL 176 02/12/2014   TRIG 278.0* 02/12/2014   HDL 52.10 02/12/2014   LDLDIRECT 86.0 02/12/2014   LDLCALC 92 12/27/2010   ALT 10 11/24/2014   AST 14 11/24/2014   NA 139 11/24/2014   K 3.6 11/24/2014   CL 105 11/24/2014   CREATININE 0.50 11/24/2014   BUN 15 11/24/2014   CO2 28 11/24/2014   TSH 0.48 09/15/2014   INR 1.01 05/13/2009   HGBA1C 5.3 08/03/2006    Dg Ribs Unilateral Left  02/05/2015  CLINICAL DATA:  Fall.  Left lower rib pain. EXAM: LEFT RIBS - 2 VIEW FINDINGS: Diffuse osteopenia. No evidence of displaced rib fracture or pneumothorax. IMPRESSION: Diffuse osteopenia. No evidence of displaced rib fracture pneumothorax. Electronically Signed   By: Marcello Moores  Register   On: 02/05/2015 15:14       Assessment & Plan:   AWV/z00.00 - Today patient counseled on age appropriate routine health concerns for screening and prevention, each reviewed and up to date or declined. Immunizations reviewed and up to date or declined. Labs ordered and reviewed. Risk factors for depression reviewed and negative. Hearing function and visual acuity are intact. ADLs screened and addressed as needed. Functional ability and level of safety reviewed and appropriate. Education, counseling and referrals performed based on assessed risks today. Patient provided with a copy of personalized plan for preventive services.  Fatigue - nonspecific symptoms/exam - check screening labs   Problem List Items Addressed This Visit    Anxiety state    Long history of same, complicated with panic attacks also overlap with chronic insomnia Uses Xanax as needed - refill today       Relevant Medications   ALPRAZolam (XANAX) 1 MG tablet   Osteopenia, senile    Check DEXA WB exercises and Vit D + Ca OTC as ongoing      Relevant Orders   DG Bone Density   Perforation of colon (HCC)    Complication following colo polypectomy 08/2014 with emergent surgical repair - reviewed Doing well at this time       Other Visit Diagnoses    Routine general medical examination at a health care facility    -  Primary    Other fatigue        Relevant Orders    Basic metabolic panel    CBC with Differential/Platelet    Hepatic function panel    TSH        Gwendolyn Grant, MD

## 2015-02-22 NOTE — Progress Notes (Signed)
Pre visit review using our clinic review tool, if applicable. No additional management support is needed unless otherwise documented below in the visit note. 

## 2015-02-22 NOTE — Assessment & Plan Note (Signed)
Check DEXA WB exercises and Vit D + Ca OTC as ongoing

## 2015-02-22 NOTE — Assessment & Plan Note (Signed)
Complication following colo polypectomy 08/2014 with emergent surgical repair - reviewed Doing well at this time

## 2015-02-25 ENCOUNTER — Telehealth: Payer: Self-pay | Admitting: Internal Medicine

## 2015-02-25 NOTE — Telephone Encounter (Signed)
Pt called and said that Dr Asa Lente said that she was going to refer Mr and mrs Merlin to Dr Camila Li?  She wants to know if he is taking them on as new patients ?

## 2015-02-26 NOTE — Telephone Encounter (Signed)
Can you call pt and ask who they would like to transfer to? [facility] is not able to accept.

## 2015-02-26 NOTE — Telephone Encounter (Signed)
i called pt and gave her info below.  She is going to see what she wants to do and call us back.

## 2015-02-26 NOTE — Telephone Encounter (Signed)
Unfortunately, I'm not able to accept any more new patients at this time.  I'm sorry! Thank you!  

## 2015-03-01 ENCOUNTER — Telehealth: Payer: Self-pay | Admitting: Internal Medicine

## 2015-03-01 NOTE — Telephone Encounter (Signed)
Please call patient tomorrow 12/6 after 12pm. She would like to know why there was a CBC for her and no CBC for her husband with the blood work. She would also like to know the results of her bone density test. She can be reached at [phone]

## 2015-03-01 NOTE — Telephone Encounter (Signed)
Bone density results given.   CBC was done in October for spouse.

## 2015-03-02 ENCOUNTER — Telehealth: Payer: Self-pay | Admitting: Internal Medicine

## 2015-03-02 NOTE — Telephone Encounter (Signed)
OK 

## 2015-03-03 ENCOUNTER — Other Ambulatory Visit: Payer: Self-pay

## 2015-03-03 MED ORDER — HEMOCYTE PLUS 106-1 MG PO CAPS: 1.0000 | ORAL_CAPSULE | Freq: Two times a day (BID) | ORAL | Status: DC

## 2015-03-03 NOTE — Telephone Encounter (Signed)
LM on Vmail to CB to schedule with Dr. Carlean Purl

## 2015-03-03 NOTE — Telephone Encounter (Signed)
Was a [facility] patient and now Alexis Olson patient, has Feb. Appointment, had recent labs drawn to check hgb, still low , iron rx refilled.

## 2015-03-24 ENCOUNTER — Other Ambulatory Visit: Payer: Self-pay | Admitting: Internal Medicine

## 2015-03-24 ENCOUNTER — Telehealth: Payer: Self-pay | Admitting: Internal Medicine

## 2015-03-24 MED ORDER — OMEPRAZOLE 20 MG PO CPDR: 20.0000 mg | DELAYED_RELEASE_CAPSULE | Freq: Every day | ORAL | Status: DC

## 2015-03-24 NOTE — Telephone Encounter (Signed)
Refill sent in as requested, has Feb. Appointment.

## 2015-04-08 DIAGNOSIS — M62830 Muscle spasm of back: Secondary | ICD-10-CM | POA: Diagnosis not present

## 2015-04-13 MED FILL — ALPRAZolam 1 MG TABS: 1 | 30 days supply | Qty: 30 | Fill #1

## 2015-04-26 MED FILL — HEMOCYTE PLUS CAPSULE: 106-1 | 30 days supply | Qty: 60 | Fill #1

## 2015-04-27 ENCOUNTER — Telehealth: Payer: Self-pay

## 2015-04-27 DIAGNOSIS — D638 Anemia in other chronic diseases classified elsewhere: Secondary | ICD-10-CM

## 2015-04-27 NOTE — Telephone Encounter (Signed)
Refill request came thru denied and needs prior authorization on her Hemocyte Plus 106-1 mg , you have not seen her yet, has 05/19/15 appointment with Dr Carlean Purl.  Will see if he wants [place] to proceed.

## 2015-04-29 ENCOUNTER — Other Ambulatory Visit (INDEPENDENT_AMBULATORY_CARE_PROVIDER_SITE_OTHER): Payer: PPO

## 2015-04-29 DIAGNOSIS — D638 Anemia in other chronic diseases classified elsewhere: Secondary | ICD-10-CM

## 2015-04-29 LAB — VITAMIN B12: Vitamin B-12: 271 pg/mL (ref 211–911)

## 2015-04-29 LAB — FERRITIN: Ferritin: 431.3 ng/mL — ABNORMAL HIGH (ref 10.0–291.0)

## 2015-04-29 NOTE — Telephone Encounter (Signed)
Tell her I need her to do a B12 and ferritin - re: chronic anemia  She actually may not need to continue the hemocyte

## 2015-04-29 NOTE — Telephone Encounter (Signed)
Left message with her husband to call us.

## 2015-04-29 NOTE — Telephone Encounter (Signed)
Spoke with Alexis Olson and she is going to come by and get the labs drawn and she will bring [place] her new insurance card to be scanned in.  She wants her Hemocyte Plus  to be sent to [facility].  The request we got was an automatic refill request from [employer] which can be discarded now since that's not her pharmacy.  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

## 2015-04-30 NOTE — Telephone Encounter (Signed)
Awaiting patient to call back so I can give her the lab results.

## 2015-04-30 NOTE — Progress Notes (Signed)
Quick Note:  Iron levels actually ok  B2 also ok Will review at f/u No refill now ______

## 2015-04-30 NOTE — Telephone Encounter (Signed)
Patient informed of labs and copy mailed to her per her request.

## 2015-05-03 MED FILL — FLUTICASONE PROP 50 MCG SPR: 50 | 30 days supply | Qty: 16 | Fill #1

## 2015-05-04 ENCOUNTER — Telehealth: Payer: Self-pay | Admitting: *Deleted

## 2015-05-04 NOTE — Telephone Encounter (Signed)
Left msg on triage Monday afternoon stating needing refills on her Zolpidem. Pt has made appt to see Dr. Charlett Blake but it is not until 08/16/15. Pls advise on refill...Alexis Olson

## 2015-05-05 MED ORDER — ZOLPIDEM TARTRATE 5 MG PO TABS: 5.0000 mg | ORAL_TABLET | Freq: Every day | ORAL | Status: DC

## 2015-05-05 MED FILL — ZOLPIDEM TARTRATE 5 MG TAB: 5 | 30 days supply | Qty: 30 | Fill #1

## 2015-05-05 NOTE — Telephone Encounter (Signed)
Pls advise on msg below.../lmb 

## 2015-05-05 NOTE — Telephone Encounter (Signed)
Called pt no answer LMOM (C) rx fax to [facility]...Johny Chess

## 2015-05-05 NOTE — Telephone Encounter (Signed)
I will refill 1 month but needs to be seen for additional refills or see Dr. Charlett Blake.

## 2015-05-06 ENCOUNTER — Telehealth: Payer: Self-pay | Admitting: Family

## 2015-05-06 MED ORDER — POTASSIUM CHLORIDE CRYS ER 20 MEQ PO TBCR: 20.0000 meq | EXTENDED_RELEASE_TABLET | Freq: Every day | ORAL | Status: DC

## 2015-05-06 NOTE — Telephone Encounter (Signed)
Potassium sent to pharmacy. Encourage her to contact [facility] office to establish with Dr. Charlett Blake or see one of the other providers as she waits for Dr. Charlett Blake appointment.

## 2015-05-06 NOTE — Telephone Encounter (Signed)
Patient is requesting a refill of potassium chloride SA (K-DUR,KLOR-CON) 20 MEQ tablet [id number] to [facility] until she see dr Charlett Blake

## 2015-05-13 ENCOUNTER — Emergency Department (HOSPITAL_COMMUNITY)
Admission: EM | Admit: 2015-05-13 | Discharge: 2015-05-13 | Disposition: A | Discharge status: Home / Self Care | Payer: PPO | Source: Home / Self Care | Attending: Family Medicine | Admitting: Family Medicine

## 2015-05-13 ENCOUNTER — Encounter (HOSPITAL_COMMUNITY): Payer: Self-pay | Admitting: Emergency Medicine

## 2015-05-13 DIAGNOSIS — J0100 Acute maxillary sinusitis, unspecified: Secondary | ICD-10-CM

## 2015-05-13 MED ORDER — AMOXICILLIN-POT CLAVULANATE 875-125 MG PO TABS: 1.0000 | ORAL_TABLET | Freq: Two times a day (BID) | ORAL | Status: DC

## 2015-05-13 NOTE — Discharge Instructions (Signed)
Sinusitis, Adult  Sinusitis is redness, soreness, and inflammation of the paranasal sinuses. Paranasal sinuses are air pockets within the bones of your face. They are located beneath your eyes, in the middle of your forehead, and above your eyes. In healthy paranasal sinuses, mucus is able to drain out, and air is able to circulate through them by way of your nose. However, when your paranasal sinuses are inflamed, mucus and air can become trapped. This can allow bacteria and other germs to grow and cause infection.  Sinusitis can develop quickly and last only a short time (acute) or continue over a long period (chronic). Sinusitis that lasts for more than 12 weeks is considered chronic.  CAUSES  Causes of sinusitis include:  · Allergies.  · Structural abnormalities, such as displacement of the cartilage that separates your nostrils (deviated septum), which can decrease the air flow through your nose and sinuses and affect sinus drainage.  · Functional abnormalities, such as when the small hairs (cilia) that line your sinuses and help remove mucus do not work properly or are not present.  SIGNS AND SYMPTOMS  Symptoms of acute and chronic sinusitis are the same. The primary symptoms are pain and pressure around the affected sinuses. Other symptoms include:  · Upper toothache.  · Earache.  · Headache.  · Bad breath.  · Decreased sense of smell and taste.  · A cough, which worsens when you are lying flat.  · Fatigue.  · Fever.  · Thick drainage from your nose, which often is green and may contain pus (purulent).  · Swelling and warmth over the affected sinuses.  DIAGNOSIS  Your health care provider will perform a physical exam. During your exam, your health care provider may perform any of the following to help determine if you have acute sinusitis or chronic sinusitis:  · Look in your nose for signs of abnormal growths in your nostrils (nasal polyps).  · Tap over the affected sinus to check for signs of  infection.  · View the inside of your sinuses using an imaging device that has a light attached (endoscope).  If your health care provider suspects that you have chronic sinusitis, one or more of the following tests may be recommended:  · Allergy tests.  · Nasal culture. A sample of mucus is taken from your nose, sent to a lab, and screened for bacteria.  · Nasal cytology. A sample of mucus is taken from your nose and examined by your health care provider to determine if your sinusitis is related to an allergy.  TREATMENT  Most cases of acute sinusitis are related to a viral infection and will resolve on their own within 10 days. Sometimes, medicines are prescribed to help relieve symptoms of both acute and chronic sinusitis. These may include pain medicines, decongestants, nasal steroid sprays, or saline sprays.  However, for sinusitis related to a bacterial infection, your health care provider will prescribe antibiotic medicines. These are medicines that will help kill the bacteria causing the infection.  Rarely, sinusitis is caused by a fungal infection. In these cases, your health care provider will prescribe antifungal medicine.  For some cases of chronic sinusitis, surgery is needed. Generally, these are cases in which sinusitis recurs more than 3 times per year, despite other treatments.  HOME CARE INSTRUCTIONS  · Drink plenty of water. Water helps thin the mucus so your sinuses can drain more easily.  · Use a humidifier.  · Inhale steam 3-4 times a day (for   example, sit in the bathroom with the shower running).  · Apply a warm, moist washcloth to your face 3-4 times a day, or as directed by your health care provider.  · Use saline nasal sprays to help moisten and clean your sinuses.  · Take medicines only as directed by your health care provider.  · If you were prescribed either an antibiotic or antifungal medicine, finish it all even if you start to feel better.  SEEK IMMEDIATE MEDICAL CARE IF:  · You have  increasing pain or severe headaches.  · You have nausea, vomiting, or drowsiness.  · You have swelling around your face.  · You have vision problems.  · You have a stiff neck.  · You have difficulty breathing.     This information is not intended to replace advice given to you by your health care provider. Make sure you discuss any questions you have with your health care provider.     Document Released: 03/13/2005 Document Revised: 04/03/2014 Document Reviewed: 03/28/2011  Elsevier Interactive Patient Education ©2016 [employer]  Sinus Rinse  WHAT IS A SINUS RINSE?  A sinus rinse is a simple home treatment that is used to rinse your sinuses with a sterile mixture of salt and water (saline solution). Sinuses are air-filled spaces in your skull behind the bones of your face and forehead that open into your nasal cavity.  You will use the following:  · Saline solution.  · Neti pot or spray bottle. This releases the saline solution into your nose and through your sinuses. Neti pots and spray bottles can be purchased at your local pharmacy, a health food store, or online.  WHEN WOULD I DO A SINUS RINSE?  A sinus rinse can help to clear mucus, dirt, dust, or pollen from the nasal cavity. You may do a sinus rinse when you have a cold, a virus, nasal allergy symptoms, a sinus infection, or stuffiness in the nose or sinuses.  If you are considering a sinus rinse:  · Ask your child's health care provider before performing a sinus rinse on your child.  · Do not do a sinus rinse if you have had ear or nasal surgery, ear infection, or blocked ears.  HOW DO I DO A SINUS RINSE?  · Wash your hands.  · Disinfect your device according to the directions provided and then dry it.  · Use the solution that comes with your device or one that is sold separately in stores. Follow the mixing directions on the package.  · Fill your device with the amount of saline solution as directed by the device instructions.  · Stand over a sink and  tilt your head sideways over the sink.  · Place the spout of the device in your upper nostril (the one closer to the ceiling).  · Gently pour or squeeze the saline solution into the nasal cavity. The liquid should drain to the lower nostril if you are not overly congested.  · Gently blow your nose. Blowing too hard may cause ear pain.  · Repeat in the other nostril.  · Clean and rinse your device with clean water and then air-dry it.  ARE THERE RISKS OF A SINUS RINSE?   Sinus rinse is generally very safe and effective. However, there are a few risks, which include:   · A burning sensation in the sinuses. This may happen if you do not make the saline solution as directed. Make sure to follow all directions when   making the saline solution.  · Infection from contaminated water. This is rare, but possible.  · Nasal irritation.     This information is not intended to replace advice given to you by your health care provider. Make sure you discuss any questions you have with your health care provider.     Document Released: 10/08/2013 Document Reviewed: 10/08/2013  Elsevier Interactive Patient Education ©2016 [employer] Inc[employer]

## 2015-05-13 NOTE — ED Notes (Signed)
Seen by frank patrick, pa -see PA notation

## 2015-05-13 NOTE — ED Provider Notes (Signed)
CSN: [id number]     Arrival date & time 05/13/15  1948 History   First MD Initiated Contact with Patient 05/13/15 2023     No chief complaint on file.  (Consider location/radiation/quality/duration/timing/severity/associated sxs/prior Treatment) HPI 82 y/o female with nasal pressure and drainage for the last few days. Symptoms have been present initally started about 2 weeks ago OTC meds and nasal rinse are helping some. Denies fever, or flu symptoms. Pain score is 3.  Past Medical History  Diagnosis Date  . Obesity   . Lumbosacral disc disease     Chronic pain; History of osteomyelitis 2010 following ESI complication  . ALLERGIC RHINITIS   . 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 ANXIETY   . ASTHMA   . Bariatric surgery status   . COLONIC POLYPS, ADENOMATOUS, HX OF 2010  . DIVERTICULOSIS, COLON   . INSOMNIA   . URINARY URGENCY   . VITAMIN B12 DEFICIENCY   . Hypertension   . Colon perforation ([place]) 08/2014    following polypetomy during colo, surgical repair   Past Surgical History  Procedure Laterality Date  . Cholecystectomy    . Tonsillectomy    . Ileojejunal bypass  1976    for obesity  . Back surgery  1994    for ruptured disc  . Laparotomy N/A 09/18/2014    Procedure: EXPLORATORY LAPAROTOMY WITH REPAIR OF CECIAL PERFORATION;  Surgeon: Autumn Messing III, MD;  Location: [facility];  Service: General;  Laterality: N/A;   Family History  Problem Relation Age of Onset  . Cervical cancer Mother   . Liver disease Sister   . Kidney disease Sister   . Cirrhosis Sister   . Colon cancer Neg Hx   . Esophageal cancer Neg Hx   . Rectal cancer Neg Hx   . Stomach cancer Neg Hx    Social History  Substance Use Topics  . Smoking status: Former Research scientist (life sciences)  . Smokeless tobacco: Never Used  . Alcohol Use: No   OB History    No data available     Review of Systems See hpi Allergies  Other; Azithromycin; Benadryl; Ciprofloxacin hcl; Codeine  phosphate; Hydrocodone; Hydrocodone-homatropine; Levaquin; Meperidine hcl; Sulfa antibiotics; and Ultram  Home Medications   Prior to Admission medications   Medication Sig Start Date End Date Taking? Authorizing Provider  acetaminophen-codeine (TYLENOL #3) 300-30 MG tablet Take 1 tablet by mouth every 8 (eight) hours as needed for moderate pain or severe pain. 02/05/15   Golden Circle, FNP  albuterol (PROVENTIL HFA;VENTOLIN HFA) 108 (90 BASE) MCG/ACT inhaler Inhale 1-2 puffs into the lungs every 6 (six) hours as needed for wheezing or shortness of breath.    Historical Provider, MD  ALPRAZolam Duanne Moron) 1 MG tablet Take 0.5-1 tablets (0.5-1 mg total) by mouth at bedtime as needed. 02/22/15   Rowe Clack, MD  amitriptyline (ELAVIL) 100 MG tablet TAKE 1 TABLET AT BEDTIME (SUBSTITUTED FOR ELAVIL) (YEARLY PHYSICAL DUE IN NOVEMBER) 03/25/15   Rosalita Chessman, DO  azelastine (OPTIVAR) 0.05 % ophthalmic solution Place 1 drop into both eyes daily as needed (dry eyes).     Historical Provider, MD  cetirizine (ZYRTEC) 10 MG tablet Take 10 mg by mouth daily.  02/05/14   Historical Provider, MD  cholecalciferol (VITAMIN D) 1000 UNITS tablet Take 1,000 Units by mouth daily.    Historical Provider, MD  cyanocobalamin (,VITAMIN B-12,) 1000 MCG/ML injection INJECT 1 ML (1,000 MCG  TOTAL) INTO THE MUSCLE EVERY 30 (THIRTY) DAYS. 11/23/14   Gatha Mayer, MD  dicyclomine (BENTYL) 10 MG capsule Take 1 capsule (10 mg total) by mouth 3 (three) times daily. 01/04/15   Jerene Bears, MD  diphenoxylate-atropine (LOMOTIL) 2.5-0.025 MG per tablet Take 2 tablets by mouth 3 (three) times daily as needed for diarrhea or loose stools. 10/13/14   Lafayette Dragon, MD  Fe Fum-FA-B Cmp-C-Zn-Mg-Mn-Cu (HEMOCYTE PLUS) 106-1 MG CAPS Take 1 capsule by mouth 2 (two) times daily. 03/03/15   Gatha Mayer, MD  fluticasone (FLONASE) 50 MCG/ACT nasal spray 2 sprays by Nasal route daily.      Historical Provider, MD  gabapentin (NEURONTIN)  300 MG capsule Take 1 capsule (300 mg total) by mouth at bedtime. 12/11/14   Rowe Clack, MD  hydrOXYzine (ATARAX) 25 MG tablet Take 25 mg by mouth 3 (three) times daily as needed for itching.     Historical Provider, MD  levocetirizine (XYZAL) 5 MG tablet Take 1 tablet (5 mg total) by mouth every evening. 08/04/14   Rowe Clack, MD  magnesium oxide (MAG-OX 400) 400 MG tablet Take 400 mg by mouth 2 (two) times daily.     Historical Provider, MD  methocarbamol (ROBAXIN) 500 MG tablet Take 1 tablet (500 mg total) by mouth 4 (four) times daily as needed (use for muscle cramps/pain). 02/22/15   Rowe Clack, MD  montelukast (SINGULAIR) 10 MG tablet Take 10 mg by mouth at bedtime.  02/11/14   Historical Provider, MD  omeprazole (PRILOSEC) 20 MG capsule Take 1 capsule (20 mg total) by mouth daily. 03/24/15   Gatha Mayer, MD  potassium chloride SA (K-DUR,KLOR-CON) 20 MEQ tablet Take 1 tablet (20 mEq total) by mouth daily. 05/06/15   Golden Circle, FNP  PX ENTERIC ASPIRIN 81 MG EC tablet Take 81 mg by mouth daily.  02/05/14   Historical Provider, MD  valACYclovir (VALTREX) 500 MG tablet Take 500 mg by mouth as needed.      Historical Provider, MD  vitamin E 400 UNIT capsule Take 400 Units by mouth daily.      Historical Provider, MD  zafirlukast (ACCOLATE) 20 MG tablet Take 20 mg by mouth daily as needed (for breathing). Take 1 by mouth twice a day 11/13/11   Historical Provider, MD  zolpidem (AMBIEN) 5 MG tablet Take 1 tablet (5 mg total) by mouth at bedtime. 05/05/15   Golden Circle, FNP   Meds Ordered and Administered this Visit  Medications - No data to display  There were no vitals taken for this visit. No data found.   Physical Exam  Constitutional: She is oriented to person, place, and time. She appears well-developed and well-nourished.  HENT:  Head: Normocephalic and atraumatic.  Right Ear: External ear normal.  Left Ear: External ear normal.  Mouth/Throat:  Oropharynx is clear and moist.  Pressure and tenderness, maxillary sinuses  Cardiovascular: Normal rate.   Pulmonary/Chest: Effort normal and breath sounds normal.  Musculoskeletal: Normal range of motion.  Neurological: She is alert and oriented to person, place, and time.  Skin: Skin is warm and dry.  Psychiatric: She has a normal mood and affect. Her behavior is normal.  Nursing note and vitals reviewed.   ED Course  Procedures (including critical care time)  Labs Review Labs Reviewed - No data to display  Imaging Review No results found.   Visual Acuity Review  Right Eye Distance:   Left Eye Distance:  Bilateral Distance:    Right Eye Near:   Left Eye Near:    Bilateral Near:         MDM   1. Acute maxillary sinusitis, recurrence not specified     Patient is advised to continue home symptomatic treatment. Prescription for  Augmentin (pt requests) sent pharmacy patient has indicated. Patient is advised that if there are new or worsening symptoms or attend the emergency department, or contact primary care provider. Instructions of care provided discharged home in stable condition. Return to work/school note provided.  THIS NOTE WAS GENERATED USING A VOICE RECOGNITION SOFTWARE PROGRAM. ALL REASONABLE EFFORTS  WERE MADE TO PROOFREAD THIS DOCUMENT FOR ACCURACY.     Konrad Felix, [place] 05/14/15 [phone]

## 2015-05-14 MED ORDER — POTASSIUM CHLORIDE CRYS ER 20 MEQ PO TBCR: 20.0000 meq | EXTENDED_RELEASE_TABLET | Freq: Every day | ORAL | Status: DC

## 2015-05-14 MED FILL — KLOR-CON M20 TABLET: 20 | 30 days supply | Qty: 30 | Fill #0

## 2015-05-14 MED FILL — LEVOCETIRIZINE 5 MG TABLET: 5 | 90 days supply | Qty: 90 | Fill #0

## 2015-05-14 NOTE — Telephone Encounter (Signed)
Medication sent to pharmacy  

## 2015-05-14 NOTE — Telephone Encounter (Signed)
Pt called stating pharmacy doesn't have script. Can you please resend it

## 2015-05-14 NOTE — Telephone Encounter (Signed)
Patient called to advise that [facility] outpatient pharmacy advised that they do not have the potassium script. Advised that we show it being confirmed.  Please call [facility] outpatient pharmacy to give a verbal for fill

## 2015-05-19 ENCOUNTER — Encounter: Payer: Self-pay | Admitting: Internal Medicine

## 2015-05-19 ENCOUNTER — Ambulatory Visit (INDEPENDENT_AMBULATORY_CARE_PROVIDER_SITE_OTHER): Payer: PPO | Admitting: Internal Medicine

## 2015-05-19 VITALS — BP 128/76 | HR 88 | Ht 65.0 in | Wt 188.4 lb

## 2015-05-19 DIAGNOSIS — K529 Noninfective gastroenteritis and colitis, unspecified: Secondary | ICD-10-CM | POA: Diagnosis not present

## 2015-05-19 DIAGNOSIS — Z9884 Bariatric surgery status: Secondary | ICD-10-CM

## 2015-05-19 DIAGNOSIS — B373 Candidiasis of vulva and vagina: Secondary | ICD-10-CM | POA: Diagnosis not present

## 2015-05-19 DIAGNOSIS — B3731 Acute candidiasis of vulva and vagina: Secondary | ICD-10-CM

## 2015-05-19 DIAGNOSIS — D638 Anemia in other chronic diseases classified elsewhere: Secondary | ICD-10-CM

## 2015-05-19 MED ORDER — FLUCONAZOLE 150 MG PO TABS: 150.0000 mg | ORAL_TABLET | Freq: Every day | ORAL | Status: DC

## 2015-05-19 MED ORDER — DIPHENOXYLATE-ATROPINE 2.5-0.025 MG PO TABS: 2.0000 | ORAL_TABLET | Freq: Three times a day (TID) | ORAL | Status: DC | PRN

## 2015-05-19 MED ORDER — DICYCLOMINE HCL 10 MG PO CAPS: 10.0000 mg | ORAL_CAPSULE | Freq: Three times a day (TID) | ORAL | Status: DC

## 2015-05-19 MED ORDER — HEMOCYTE PLUS 106-1 MG PO CAPS: 1.0000 | ORAL_CAPSULE | Freq: Two times a day (BID) | ORAL | Status: DC

## 2015-05-19 MED FILL — FLUCONAZOLE 150 MG TABLET: 150 | 1 days supply | Qty: 1 | Fill #0

## 2015-05-19 MED FILL — DICYCLOMINE 10 MG CAPSULE: 10 | 90 days supply | Qty: 270 | Fill #0

## 2015-05-19 NOTE — Progress Notes (Signed)
   Subjective:    Patient ID: [name], female    DOB: 08-Sep-1933, 82 y.o.   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 Olevia Perches.  she has chronic diarrhea, status post jejunoileal bypass many years ago. She has been controlled reasonably well with a regimen of dicyclomine, Lomotil and probiotics. She takes a PPI for reflux. She has to take vitamin supplements including magnesium oxide. She has a chronic mild anemia and has been maintained on hemocyte.Diarrhea worse after starting Augmenti for URI and she has vaginal yeast infection. Medications, allergies, past medical history, past surgical history, family history and social history are reviewed and updated in the EMR.  Review of Systems As above    Objective:   Physical Exam @BP  128/76 mmHg  Pulse 88  Ht 5\' 5"  (1.651 m)  Wt 188 lb 6.4 oz (85.458 kg)  BMI 31.35 kg/m2@  General:  NAD Eyes:   anicteric Lungs:  clear Heart::  S1S2 no rubs, murmurs or gallops Abdomen:  soft and nontender, BS+, lax abdominal wall. Low midline scars Ext:   no edema, cyanosis or clubbing    Data Reviewed:  Lab Results  Component Value Date   FERRITIN 431.3* 04/29/2015   Lab Results  Component Value Date   VITAMINB12 271 04/29/2015   Lab Results  Component Value Date   WBC 8.8 02/22/2015   HGB 11.2* 02/22/2015   HCT 33.8* 02/22/2015   MCV 95.9 02/22/2015   PLT 283.0 02/22/2015      Assessment & Plan:   1. Anemia of chronic disease   2. Chronic diarrhea   3. History of gastrointestinal tract bypass - jejunoileal   4. Candida vaginitis    Refill Hemocyte and Lomotil and dicyclomine RTC 1 year Fluconazole 150 mg x 1 for Candida

## 2015-05-19 NOTE — Patient Instructions (Addendum)
We have sent the following medications to your pharmacy for you to pick up at your convenience: Lomotil (faxed to [facility])   Please come to the lab in 2 months for a CBC/diff to be drawn.  No appointment needed and they are open 7:30AM-5:30PM.    Follow up in a year or sooner if needed with Dr Carlean Purl.    I appreciate the opportunity to care for you. Silvano Rusk, MD, [facility]

## 2015-05-20 ENCOUNTER — Emergency Department (HOSPITAL_COMMUNITY)
Admission: EM | Admit: 2015-05-20 | Discharge: 2015-05-20 | Disposition: A | Discharge status: Home / Self Care | Payer: PPO | Source: Home / Self Care | Attending: Emergency Medicine | Admitting: Emergency Medicine

## 2015-05-20 ENCOUNTER — Encounter (HOSPITAL_COMMUNITY): Payer: Self-pay | Admitting: Emergency Medicine

## 2015-05-20 DIAGNOSIS — J40 Bronchitis, not specified as acute or chronic: Secondary | ICD-10-CM | POA: Diagnosis not present

## 2015-05-20 MED ORDER — DOXYCYCLINE HYCLATE 100 MG PO CAPS: 100.0000 mg | ORAL_CAPSULE | Freq: Two times a day (BID) | ORAL | Status: DC

## 2015-05-20 MED ORDER — PREDNISONE 50 MG PO TABS: ORAL_TABLET | ORAL | Status: DC

## 2015-05-20 MED ORDER — FLUCONAZOLE 150 MG PO TABS: 150.0000 mg | ORAL_TABLET | Freq: Once | ORAL | Status: DC

## 2015-05-20 MED ORDER — PROMETHAZINE-CODEINE 6.25-10 MG/5ML PO SYRP: 5.0000 mL | ORAL_SOLUTION | Freq: Four times a day (QID) | ORAL | Status: DC | PRN

## 2015-05-20 MED ORDER — IPRATROPIUM-ALBUTEROL 0.5-2.5 (3) MG/3ML IN SOLN
3.0000 mL | Freq: Once | RESPIRATORY_TRACT | Status: AC
  Administered 2015-05-20: 3 mL via RESPIRATORY_TRACT

## 2015-05-20 MED ORDER — IPRATROPIUM-ALBUTEROL 0.5-2.5 (3) MG/3ML IN SOLN
RESPIRATORY_TRACT | Status: AC
  Filled 2015-05-20: qty 3

## 2015-05-20 NOTE — ED Provider Notes (Signed)
CSN: [id number]     Arrival date & time 05/20/15  1806 History   First MD Initiated Contact with Patient 05/20/15 1813     Chief Complaint  Patient presents with  . Cough   (Consider location/radiation/quality/duration/timing/severity/associated sxs/prior Treatment) HPI  She is an 82 year old woman here for evaluation of cough. She was seen here 2 weeks ago and diagnosed with a sinus infection.  She was started on Augmentin, but she was unable to finish this due to diarrhea. She reports continued nasal congestion and sinus pressure. She is also developed cough, wheezing, and intermittent shortness of breath. She has been using her albuterol inhaler at home without much improvement. She denies any fevers.  She also reports a yeast infection. She did get a prescription for a one-time dose of Diflucan from her GI doctor yesterday, but states she typically needs a 2 dose regimen.  Past Medical History  Diagnosis Date  . Obesity   . Lumbosacral disc disease     Chronic pain; History of osteomyelitis 2010 following ESI complication  . ALLERGIC RHINITIS   . 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 ANXIETY   . ASTHMA   . Bariatric surgery status   . COLONIC POLYPS, ADENOMATOUS, HX OF 2010  . DIVERTICULOSIS, COLON   . INSOMNIA   . URINARY URGENCY   . VITAMIN B12 DEFICIENCY   . Hypertension   . Colon perforation ([place]) 08/2014    following polypetomy during colo, surgical repair   Past Surgical History  Procedure Laterality Date  . Cholecystectomy    . Tonsillectomy    . Ileojejunal bypass  1976    for obesity  . Back surgery  1994    for ruptured disc  . Laparotomy N/A 09/18/2014    Procedure: EXPLORATORY LAPAROTOMY WITH REPAIR OF CECIAL PERFORATION;  Surgeon: Autumn Messing III, MD;  Location: [facility];  Service: General;  Laterality: N/A;   Family History  Problem Relation Age of Onset  . Cervical cancer Mother   . Liver disease Sister   . Kidney  disease Sister   . Cirrhosis Sister   . Colon cancer Neg Hx   . Esophageal cancer Neg Hx   . Rectal cancer Neg Hx   . Stomach cancer Neg Hx    Social History  Substance Use Topics  . Smoking status: Former Research scientist (life sciences)  . Smokeless tobacco: Never Used  . Alcohol Use: No   OB History    No data available     Review of Systems As in history of present illness Allergies  Other; Azithromycin; Benadryl; Ciprofloxacin hcl; Codeine phosphate; Hydrocodone; Hydrocodone-homatropine; Levaquin; Meperidine hcl; Sulfa antibiotics; and Ultram  Home Medications   Prior to Admission medications   Medication Sig Start Date End Date Taking? Authorizing Provider  acetaminophen-codeine (TYLENOL #3) 300-30 MG tablet Take 1 tablet by mouth every 8 (eight) hours as needed for moderate pain or severe pain. 02/05/15   Golden Circle, FNP  albuterol (PROVENTIL HFA;VENTOLIN HFA) 108 (90 BASE) MCG/ACT inhaler Inhale 1-2 puffs into the lungs every 6 (six) hours as needed for wheezing or shortness of breath.    Historical Provider, MD  ALPRAZolam Duanne Moron) 1 MG tablet Take 0.5-1 tablets (0.5-1 mg total) by mouth at bedtime as needed. 02/22/15   Rowe Clack, MD  amitriptyline (ELAVIL) 100 MG tablet TAKE 1 TABLET AT BEDTIME (SUBSTITUTED FOR ELAVIL) (YEARLY PHYSICAL DUE IN NOVEMBER) 03/25/15   Rosalita Chessman,  DO  azelastine (OPTIVAR) 0.05 % ophthalmic solution Place 1 drop into both eyes daily as needed (dry eyes).     Historical Provider, MD  cetirizine (ZYRTEC) 10 MG tablet Take 10 mg by mouth daily.  02/05/14   Historical Provider, MD  cholecalciferol (VITAMIN D) 1000 UNITS tablet Take 1,000 Units by mouth daily.    Historical Provider, MD  cyanocobalamin (,VITAMIN B-12,) 1000 MCG/ML injection INJECT 1 ML (1,000 MCG TOTAL) INTO THE MUSCLE EVERY 30 (THIRTY) DAYS. 11/23/14   Gatha Mayer, MD  dicyclomine (BENTYL) 10 MG capsule Take 1 capsule (10 mg total) by mouth 3 (three) times daily. 05/19/15   Gatha Mayer,  MD  diphenoxylate-atropine (LOMOTIL) 2.5-0.025 MG tablet Take 2 tablets by mouth 3 (three) times daily as needed for diarrhea or loose stools. 05/19/15   Gatha Mayer, MD  doxycycline (VIBRAMYCIN) 100 MG capsule Take 1 capsule (100 mg total) by mouth 2 (two) times daily. 05/20/15   Melony Overly, MD  Fe Fum-FA-B Cmp-C-Zn-Mg-Mn-Cu (HEMOCYTE PLUS) 106-1 MG CAPS Take 1 capsule by mouth 2 (two) times daily. 05/19/15   Gatha Mayer, MD  fluconazole (DIFLUCAN) 150 MG tablet Take 1 tablet (150 mg total) by mouth once. Take 2nd pill if symptoms not completely resolved in 3 days. 05/20/15   Melony Overly, MD  fluticasone (FLONASE) 50 MCG/ACT nasal spray 2 sprays by Nasal route daily.      Historical Provider, MD  gabapentin (NEURONTIN) 300 MG capsule Take 1 capsule (300 mg total) by mouth at bedtime. 12/11/14   Rowe Clack, MD  hydrOXYzine (ATARAX) 25 MG tablet Take 25 mg by mouth 3 (three) times daily as needed for itching.     Historical Provider, MD  levocetirizine (XYZAL) 5 MG tablet Take 1 tablet (5 mg total) by mouth every evening. 08/04/14   Rowe Clack, MD  magnesium oxide (MAG-OX 400) 400 MG tablet Take 400 mg by mouth 2 (two) times daily.     Historical Provider, MD  methocarbamol (ROBAXIN) 500 MG tablet Take 1 tablet (500 mg total) by mouth 4 (four) times daily as needed (use for muscle cramps/pain). 02/22/15   Rowe Clack, MD  montelukast (SINGULAIR) 10 MG tablet Take 10 mg by mouth at bedtime.  02/11/14   Historical Provider, MD  omeprazole (PRILOSEC) 20 MG capsule Take 1 capsule (20 mg total) by mouth daily. 03/24/15   Gatha Mayer, MD  potassium chloride SA (K-DUR,KLOR-CON) 20 MEQ tablet Take 1 tablet (20 mEq total) by mouth daily. 05/14/15   Golden Circle, FNP  predniSONE (DELTASONE) 50 MG tablet Take 1 pill daily for 5 days. 05/20/15   Melony Overly, MD  promethazine-codeine (PHENERGAN WITH CODEINE) 6.25-10 MG/5ML syrup Take 5 mLs by mouth every 6 (six) hours as needed for  cough. 05/20/15   Melony Overly, MD  PX ENTERIC ASPIRIN 81 MG EC tablet Take 81 mg by mouth daily.  02/05/14   Historical Provider, MD  valACYclovir (VALTREX) 500 MG tablet Take 500 mg by mouth as needed.      Historical Provider, MD  vitamin E 400 UNIT capsule Take 400 Units by mouth daily.      Historical Provider, MD  zafirlukast (ACCOLATE) 20 MG tablet Take 20 mg by mouth daily as needed (for breathing). Take 1 by mouth twice a day 11/13/11   Historical Provider, MD  zolpidem (AMBIEN) 5 MG tablet Take 1 tablet (5 mg total) by mouth at bedtime. 05/05/15  Golden Circle, FNP   Meds Ordered and Administered this Visit   Medications  ipratropium-albuterol (DUONEB) 0.5-2.5 (3) MG/3ML nebulizer solution 3 mL (3 mLs Nebulization Given 05/20/15 1857)    BP 136/76 mmHg  Pulse 83  Temp(Src) 97.9 F (36.6 C) (Oral)  Resp 16  SpO2 100% No data found.   Physical Exam  Constitutional: She is oriented to person, place, and time. She appears well-developed and well-nourished. No distress.  Neck: Neck supple.  Cardiovascular: Normal rate, regular rhythm and normal heart sounds.   No murmur heard. Pulmonary/Chest: Effort normal. No respiratory distress. She has wheezes (diffuse expiratory wheezes). She has no rales.  Neurological: She is alert and oriented to person, place, and time.    ED Course  Procedures (including critical care time)  Labs Review Labs Reviewed - No data to display  Imaging Review No results found.    MDM   1. Bronchitis    Lung sounds much improved after DuoNeb. She does still have faint end expiratory wheezes in bilateral bases.  Treat with doxycycline and prednisone. Phenergan With Codeine for cough. Recommend albuterol to use every 4 hours for the next several days. Follow-up as needed.    Melony Overly, MD 05/20/15 [phone]

## 2015-05-20 NOTE — ED Notes (Signed)
Pt was seen last week for sinusitis and prescribed Augmentin.  Pt reports diarrhea and yeast infection from the medication so she did not complete her course of antibiotics.  Her cough is worse and she feels like her sinus congestion has moved to her chest.

## 2015-05-20 NOTE — Discharge Instructions (Signed)
You have bronchitis. Take doxycycline and prednisone as prescribed. Use the cough syrup as needed for cough. Use the albuterol every 4 hours as needed for wheezing or cough. You should see improvement in the next 3-5 days. If you develop fevers, difficulty breathing, or are just not getting better, please come back or go to the emergency room.

## 2015-05-24 MED FILL — ALPRAZolam 1 MG TABS: 1 | 30 days supply | Qty: 30 | Fill #2

## 2015-05-31 MED FILL — DIPHENOXYLATE-ATROPINE TAB: 2.5-0.025 | 25 days supply | Qty: 150 | Fill #0

## 2015-06-07 MED FILL — ZOLPIDEM TARTRATE 5 MG TAB: 5 | 30 days supply | Qty: 30 | Fill #2

## 2015-06-17 ENCOUNTER — Telehealth: Payer: Self-pay | Admitting: Internal Medicine

## 2015-06-17 MED FILL — HEMOCYTE PLUS CAPSULE: 106-1 | 30 days supply | Qty: 60 | Fill #0

## 2015-06-17 NOTE — Telephone Encounter (Signed)
Patient notified she had a recent hepatic done.  I reviewed the results with the patient

## 2015-06-23 ENCOUNTER — Other Ambulatory Visit: Payer: Self-pay | Admitting: Family

## 2015-06-24 MED FILL — GABAPENTIN 300 MG CAPSULE: 300 | 90 days supply | Qty: 90 | Fill #2

## 2015-06-24 MED FILL — KLOR-CON M20 TABLET: 20 | 30 days supply | Qty: 30 | Fill #0

## 2015-07-02 ENCOUNTER — Other Ambulatory Visit: Payer: Self-pay

## 2015-07-02 MED ORDER — OMEPRAZOLE 20 MG PO CPDR: 20.0000 mg | DELAYED_RELEASE_CAPSULE | Freq: Every day | ORAL | Status: DC

## 2015-07-02 MED FILL — OMEPRAZOLE DR 20 MG CAPSULE: 20 | 90 days supply | Qty: 90 | Fill #0

## 2015-07-02 MED FILL — MONTELUKAST SOD 10 MG TAB: 10 | 90 days supply | Qty: 90 | Fill #0

## 2015-07-05 MED FILL — VALACYCLOVIR HCL 500 MG TAB: 500 | 30 days supply | Qty: 30 | Fill #0

## 2015-07-07 MED FILL — ZOLPIDEM TARTRATE 5 MG TAB: 5 | 30 days supply | Qty: 30 | Fill #3

## 2015-07-08 DIAGNOSIS — Z6830 Body mass index (BMI) 30.0-30.9, adult: Secondary | ICD-10-CM | POA: Diagnosis not present

## 2015-07-08 DIAGNOSIS — M5416 Radiculopathy, lumbar region: Secondary | ICD-10-CM | POA: Diagnosis not present

## 2015-07-08 MED FILL — METHOCARBAMOL 750 MG TABLET: 750 | 22 days supply | Qty: 90 | Fill #0

## 2015-07-08 MED FILL — ACETAMINOPHEN/COD #3 TABLET: 300-30 | 15 days supply | Qty: 90 | Fill #0

## 2015-07-13 ENCOUNTER — Other Ambulatory Visit: Payer: Self-pay

## 2015-07-13 MED ORDER — AMITRIPTYLINE HCL 100 MG PO TABS: ORAL_TABLET | ORAL | Status: DC

## 2015-07-13 NOTE — Telephone Encounter (Signed)
Pt left message on triage. Refill for amitriptyline.

## 2015-07-13 NOTE — Telephone Encounter (Signed)
Received call pt states she no longer uses [facility], and she is out of her amitriptyline 100 mg requesting rx to be sent to [facility]. Verified with patient has she made appt w/new PCP since Dr. Asa Lente is no longer here. Pt states she will be seeing Dr. Randel Pigg bu her appt is not until May. Inform can send refill until she establishes w/Dr. Randel Pigg sent to [facility]...Johny Chess

## 2015-07-13 NOTE — Telephone Encounter (Signed)
Noted  

## 2015-07-14 MED FILL — AMITRIPTYLINE HCL 100 MG TA: 100 | 30 days supply | Qty: 30 | Fill #0

## 2015-07-21 DIAGNOSIS — M5416 Radiculopathy, lumbar region: Secondary | ICD-10-CM | POA: Diagnosis not present

## 2015-07-21 DIAGNOSIS — M5126 Other intervertebral disc displacement, lumbar region: Secondary | ICD-10-CM | POA: Diagnosis not present

## 2015-07-21 DIAGNOSIS — M4806 Spinal stenosis, lumbar region: Secondary | ICD-10-CM | POA: Diagnosis not present

## 2015-07-22 DIAGNOSIS — M5416 Radiculopathy, lumbar region: Secondary | ICD-10-CM | POA: Diagnosis not present

## 2015-07-22 MED FILL — oxyCODONE HCL 5 MG TABS: 5 | 5 days supply | Qty: 30 | Fill #0

## 2015-07-24 MED FILL — KLOR-CON M20 TABLET: 20 | 30 days supply | Qty: 30 | Fill #1

## 2015-07-30 DIAGNOSIS — M5416 Radiculopathy, lumbar region: Secondary | ICD-10-CM | POA: Diagnosis not present

## 2015-07-30 DIAGNOSIS — M4726 Other spondylosis with radiculopathy, lumbar region: Secondary | ICD-10-CM | POA: Diagnosis not present

## 2015-07-30 MED FILL — OXYCODONE/APAP 5/325MG: 5-325 | 10 days supply | Qty: 30 | Fill #0

## 2015-08-02 MED FILL — oxyCODONE HCL 5 MG TABS: 5 | 15 days supply | Qty: 90 | Fill #0

## 2015-08-03 MED FILL — ALPRAZolam 1 MG TABS: 1 | 30 days supply | Qty: 30 | Fill #3

## 2015-08-06 ENCOUNTER — Telehealth: Payer: Self-pay | Admitting: Internal Medicine

## 2015-08-06 MED FILL — LEVOCETIRIZINE 5 MG TABLET: 5 | 90 days supply | Qty: 90 | Fill #1

## 2015-08-06 NOTE — Telephone Encounter (Signed)
Pt called in and said that the med she just called in , (which she did not know the name of) needs a PA.  Her appt with dr Charlett Blake is not till the 22nd.  She is unsure how to get her meds?     Ins number [phone]   Pt cell number [phone]

## 2015-08-09 MED FILL — AMITRIPTYLINE HCL 100 MG TA: 100 | 30 days supply | Qty: 30 | Fill #1

## 2015-08-09 MED FILL — HEMOCYTE PLUS CAPSULE: 106-1 | 30 days supply | Qty: 60 | Fill #1

## 2015-08-09 MED FILL — ZOLPIDEM TARTRATE 5 MG TAB: 5 | 30 days supply | Qty: 30 | Fill #4

## 2015-08-09 NOTE — Telephone Encounter (Signed)
APPROVED through 03/26/2016. Pt advised via home VM

## 2015-08-09 NOTE — Telephone Encounter (Signed)
PA initiated via [employer]

## 2015-08-09 NOTE — Telephone Encounter (Signed)
Up front as [facility]. Forwarding to [facility] for assistance.

## 2015-08-11 DIAGNOSIS — Z0000 Encounter for general adult medical examination without abnormal findings: Secondary | ICD-10-CM | POA: Diagnosis not present

## 2015-08-11 DIAGNOSIS — R8271 Bacteriuria: Secondary | ICD-10-CM | POA: Diagnosis not present

## 2015-08-11 DIAGNOSIS — R338 Other retention of urine: Secondary | ICD-10-CM | POA: Diagnosis not present

## 2015-08-11 DIAGNOSIS — N390 Urinary tract infection, site not specified: Secondary | ICD-10-CM | POA: Diagnosis not present

## 2015-08-11 MED FILL — CEPHALEXIN 500 MG CAPSULE: 500 | 7 days supply | Qty: 21 | Fill #0

## 2015-08-13 ENCOUNTER — Other Ambulatory Visit (INDEPENDENT_AMBULATORY_CARE_PROVIDER_SITE_OTHER): Payer: PPO

## 2015-08-13 ENCOUNTER — Telehealth: Payer: Self-pay | Admitting: *Deleted

## 2015-08-13 DIAGNOSIS — Z0000 Encounter for general adult medical examination without abnormal findings: Secondary | ICD-10-CM | POA: Diagnosis not present

## 2015-08-13 DIAGNOSIS — D638 Anemia in other chronic diseases classified elsewhere: Secondary | ICD-10-CM

## 2015-08-13 DIAGNOSIS — R338 Other retention of urine: Secondary | ICD-10-CM | POA: Diagnosis not present

## 2015-08-13 LAB — CBC WITH DIFFERENTIAL/PLATELET
Basophils Absolute: 0 10*3/uL (ref 0.0–0.1)
Basophils Relative: 0.3 % (ref 0.0–3.0)
Eosinophils Absolute: 0.4 10*3/uL (ref 0.0–0.7)
Eosinophils Relative: 4 % (ref 0.0–5.0)
HCT: 35.9 % — ABNORMAL LOW (ref 36.0–46.0)
Hemoglobin: 11.9 g/dL — ABNORMAL LOW (ref 12.0–15.0)
Lymphocytes Relative: 25.8 % (ref 12.0–46.0)
Lymphs Abs: 2.6 10*3/uL (ref 0.7–4.0)
MCHC: 33.2 g/dL (ref 30.0–36.0)
MCV: 93.7 fl (ref 78.0–100.0)
Monocytes Absolute: 0.9 10*3/uL (ref 0.1–1.0)
Monocytes Relative: 8.8 % (ref 3.0–12.0)
Neutro Abs: 6.1 10*3/uL (ref 1.4–7.7)
Neutrophils Relative %: 61.1 % (ref 43.0–77.0)
Platelets: 255 10*3/uL (ref 150.0–400.0)
RBC: 3.83 Mil/uL — ABNORMAL LOW (ref 3.87–5.11)
RDW: 13.6 % (ref 11.5–15.5)
WBC: 10 10*3/uL (ref 4.0–10.5)

## 2015-08-13 NOTE — Telephone Encounter (Signed)
Pt not available at time of call.

## 2015-08-13 NOTE — Progress Notes (Signed)
Quick Note:  Let her know Hgb is just a hare low and stable She is ok off iron No new recommendations Her diagnosis is anemia of chronic disease and usually do not need iron supplements with this Dr. Charlett Blake can also monitor this ______

## 2015-08-16 ENCOUNTER — Encounter: Payer: Self-pay | Admitting: Family Medicine

## 2015-08-16 ENCOUNTER — Telehealth: Payer: Self-pay | Admitting: *Deleted

## 2015-08-16 ENCOUNTER — Ambulatory Visit (INDEPENDENT_AMBULATORY_CARE_PROVIDER_SITE_OTHER): Payer: PPO | Admitting: Family Medicine

## 2015-08-16 VITALS — BP 118/72 | HR 78 | Temp 98.3°F | Ht 65.0 in | Wt 187.4 lb

## 2015-08-16 DIAGNOSIS — E785 Hyperlipidemia, unspecified: Secondary | ICD-10-CM

## 2015-08-16 DIAGNOSIS — M51379 Other intervertebral disc degeneration, lumbosacral region without mention of lumbar back pain or lower extremity pain: Secondary | ICD-10-CM

## 2015-08-16 DIAGNOSIS — D649 Anemia, unspecified: Secondary | ICD-10-CM | POA: Diagnosis not present

## 2015-08-16 DIAGNOSIS — K529 Noninfective gastroenteritis and colitis, unspecified: Secondary | ICD-10-CM | POA: Diagnosis not present

## 2015-08-16 DIAGNOSIS — G4700 Insomnia, unspecified: Secondary | ICD-10-CM

## 2015-08-16 DIAGNOSIS — E538 Deficiency of other specified B group vitamins: Secondary | ICD-10-CM | POA: Diagnosis not present

## 2015-08-16 DIAGNOSIS — B009 Herpesviral infection, unspecified: Secondary | ICD-10-CM | POA: Diagnosis not present

## 2015-08-16 DIAGNOSIS — M8580 Other specified disorders of bone density and structure, unspecified site: Secondary | ICD-10-CM

## 2015-08-16 DIAGNOSIS — K219 Gastro-esophageal reflux disease without esophagitis: Secondary | ICD-10-CM

## 2015-08-16 DIAGNOSIS — M5137 Other intervertebral disc degeneration, lumbosacral region: Secondary | ICD-10-CM

## 2015-08-16 DIAGNOSIS — N390 Urinary tract infection, site not specified: Secondary | ICD-10-CM

## 2015-08-16 DIAGNOSIS — M899 Disorder of bone, unspecified: Secondary | ICD-10-CM

## 2015-08-16 HISTORY — DX: Herpesviral infection, unspecified: B00.9

## 2015-08-16 MED FILL — CEPHALEXIN 250 MG CAPSULE: 250 | 30 days supply | Qty: 30 | Fill #0

## 2015-08-16 NOTE — Assessment & Plan Note (Addendum)
encouraged to continue treatment and will continue to monitor

## 2015-08-16 NOTE — Telephone Encounter (Addendum)
No lab orders . Patient decided to reschedule then wait. Can you please make sure lab orders are put in before patient comes to lab . Thanks

## 2015-08-16 NOTE — Telephone Encounter (Signed)
That is done 99 % of the time. Simple over site in a busy day with a new patient

## 2015-08-16 NOTE — Patient Instructions (Signed)
Preventive Care for Adults, Female A healthy lifestyle and preventive care can promote health and wellness. Preventive health guidelines for women include the following key practices.  A routine yearly physical is a good way to check with your health care provider about your health and preventive screening. It is a chance to share any concerns and updates on your health and to receive a thorough exam.  Visit your dentist for a routine exam and preventive care every 6 months. Brush your teeth twice a day and floss once a day. Good oral hygiene prevents tooth decay and gum disease.  The frequency of eye exams is based on your age, health, family medical history, use of contact lenses, and other factors. Follow your health care provider's recommendations for frequency of eye exams.  Eat a healthy diet. Foods like vegetables, fruits, whole grains, low-fat dairy products, and lean protein foods contain the nutrients you need without too many calories. Decrease your intake of foods high in solid fats, added sugars, and salt. Eat the right amount of calories for you.Get information about a proper diet from your health care provider, if necessary.  Regular physical exercise is one of the most important things you can do for your health. Most adults should get at least 150 minutes of moderate-intensity exercise (any activity that increases your heart rate and causes you to sweat) each week. In addition, most adults need muscle-strengthening exercises on 2 or more days a week.  Maintain a healthy weight. The body mass index (BMI) is a screening tool to identify possible weight problems. It provides an estimate of body fat based on height and weight. Your health care provider can find your BMI and can help you achieve or maintain a healthy weight.For adults 20 years and older:  A BMI below 18.5 is considered underweight.  A BMI of 18.5 to 24.9 is normal.  A BMI of 25 to 29.9 is considered overweight.  A  BMI of 30 and above is considered obese.  Maintain normal blood lipids and cholesterol levels by exercising and minimizing your intake of saturated fat. Eat a balanced diet with plenty of fruit and vegetables. Blood tests for lipids and cholesterol should begin at age 45 and be repeated every 5 years. If your lipid or cholesterol levels are high, you are over 50, or you are at high risk for heart disease, you may need your cholesterol levels checked more frequently.Ongoing high lipid and cholesterol levels should be treated with medicines if diet and exercise are not working.  If you smoke, find out from your health care provider how to quit. If you do not use tobacco, do not start.  Lung cancer screening is recommended for adults aged 45-80 years who are at high risk for developing lung cancer because of a history of smoking. A yearly low-dose CT scan of the lungs is recommended for people who have at least a 30-pack-year history of smoking and are a current smoker or have quit within the past 15 years. A pack year of smoking is smoking an average of 1 pack of cigarettes a day for 1 year (for example: 1 pack a day for 30 years or 2 packs a day for 15 years). Yearly screening should continue until the smoker has stopped smoking for at least 15 years. Yearly screening should be stopped for people who develop a health problem that would prevent them from having lung cancer treatment.  If you are pregnant, do not drink alcohol. If you are  breastfeeding, be very cautious about drinking alcohol. If you are not pregnant and choose to drink alcohol, do not have more than 1 drink per day. One drink is considered to be 12 ounces (355 mL) of beer, 5 ounces (148 mL) of wine, or 1.5 ounces (44 mL) of liquor.  Avoid use of street drugs. Do not share needles with anyone. Ask for help if you need support or instructions about stopping the use of drugs.  High blood pressure causes heart disease and increases the risk  of stroke. Your blood pressure should be checked at least every 1 to 2 years. Ongoing high blood pressure should be treated with medicines if weight loss and exercise do not work.  If you are 55-79 years old, ask your health care provider if you should take aspirin to prevent strokes.  Diabetes screening is done by taking a blood sample to check your blood glucose level after you have not eaten for a certain period of time (fasting). If you are not overweight and you do not have risk factors for diabetes, you should be screened once every 3 years starting at age 45. If you are overweight or obese and you are 40-70 years of age, you should be screened for diabetes every year as part of your cardiovascular risk assessment.  Breast cancer screening is essential preventive care for women. You should practice "breast self-awareness." This means understanding the normal appearance and feel of your breasts and may include breast self-examination. Any changes detected, no matter how small, should be reported to a health care provider. Women in their 20s and 30s should have a clinical breast exam (CBE) by a health care provider as part of a regular health exam every 1 to 3 years. After age 40, women should have a CBE every year. Starting at age 40, women should consider having a mammogram (breast X-ray test) every year. Women who have a family history of breast cancer should talk to their health care provider about genetic screening. Women at a high risk of breast cancer should talk to their health care providers about having an MRI and a mammogram every year.  Breast cancer gene (BRCA)-related cancer risk assessment is recommended for women who have family members with BRCA-related cancers. BRCA-related cancers include breast, ovarian, tubal, and peritoneal cancers. Having family members with these cancers may be associated with an increased risk for harmful changes (mutations) in the breast cancer genes BRCA1 and  BRCA2. Results of the assessment will determine the need for genetic counseling and BRCA1 and BRCA2 testing.  Your health care provider may recommend that you be screened regularly for cancer of the pelvic organs (ovaries, uterus, and vagina). This screening involves a pelvic examination, including checking for microscopic changes to the surface of your cervix (Pap test). You may be encouraged to have this screening done every 3 years, beginning at age 21.  For women ages 30-65, health care providers may recommend pelvic exams and Pap testing every 3 years, or they may recommend the Pap and pelvic exam, combined with testing for human papilloma virus (HPV), every 5 years. Some types of HPV increase your risk of cervical cancer. Testing for HPV may also be done on women of any age with unclear Pap test results.  Other health care providers may not recommend any screening for nonpregnant women who are considered low risk for pelvic cancer and who do not have symptoms. Ask your health care provider if a screening pelvic exam is right for   you.  If you have had past treatment for cervical cancer or a condition that could lead to cancer, you need Pap tests and screening for cancer for at least 20 years after your treatment. If Pap tests have been discontinued, your risk factors (such as having a new sexual partner) need to be reassessed to determine if screening should resume. Some women have medical problems that increase the chance of getting cervical cancer. In these cases, your health care provider may recommend more frequent screening and Pap tests.  Colorectal cancer can be detected and often prevented. Most routine colorectal cancer screening begins at the age of 50 years and continues through age 75 years. However, your health care provider may recommend screening at an earlier age if you have risk factors for colon cancer. On a yearly basis, your health care provider may provide home test kits to check  for hidden blood in the stool. Use of a small camera at the end of a tube, to directly examine the colon (sigmoidoscopy or colonoscopy), can detect the earliest forms of colorectal cancer. Talk to your health care provider about this at age 50, when routine screening begins. Direct exam of the colon should be repeated every 5-10 years through age 75 years, unless early forms of precancerous polyps or small growths are found.  People who are at an increased risk for hepatitis B should be screened for this virus. You are considered at high risk for hepatitis B if:  You were born in a country where hepatitis B occurs often. Talk with your health care provider about which countries are considered high risk.  Your parents were born in a high-risk country and you have not received a shot to protect against hepatitis B (hepatitis B vaccine).  You have HIV or AIDS.  You use needles to inject street drugs.  You live with, or have sex with, someone who has hepatitis B.  You get hemodialysis treatment.  You take certain medicines for conditions like cancer, organ transplantation, and autoimmune conditions.  Hepatitis C blood testing is recommended for all people born from 1945 through 1965 and any individual with known risks for hepatitis C.  Practice safe sex. Use condoms and avoid high-risk sexual practices to reduce the spread of sexually transmitted infections (STIs). STIs include gonorrhea, chlamydia, syphilis, trichomonas, herpes, HPV, and human immunodeficiency virus (HIV). Herpes, HIV, and HPV are viral illnesses that have no cure. They can result in disability, cancer, and death.  You should be screened for sexually transmitted illnesses (STIs) including gonorrhea and chlamydia if:  You are sexually active and are younger than 24 years.  You are older than 24 years and your health care provider tells you that you are at risk for this type of infection.  Your sexual activity has changed  since you were last screened and you are at an increased risk for chlamydia or gonorrhea. Ask your health care provider if you are at risk.  If you are at risk of being infected with HIV, it is recommended that you take a prescription medicine daily to prevent HIV infection. This is called preexposure prophylaxis (PrEP). You are considered at risk if:  You are sexually active and do not regularly use condoms or know the HIV status of your partner(s).  You take drugs by injection.  You are sexually active with a partner who has HIV.  Talk with your health care provider about whether you are at high risk of being infected with HIV. If   you choose to begin PrEP, you should first be tested for HIV. You should then be tested every 3 months for as long as you are taking PrEP.  Osteoporosis is a disease in which the bones lose minerals and strength with aging. This can result in serious bone fractures or breaks. The risk of osteoporosis can be identified using a bone density scan. Women ages 67 years and over and women at risk for fractures or osteoporosis should discuss screening with their health care providers. Ask your health care provider whether you should take a calcium supplement or vitamin D to reduce the rate of osteoporosis.  Menopause can be associated with physical symptoms and risks. Hormone replacement therapy is available to decrease symptoms and risks. You should talk to your health care provider about whether hormone replacement therapy is right for you.  Use sunscreen. Apply sunscreen liberally and repeatedly throughout the day. You should seek shade when your shadow is shorter than you. Protect yourself by wearing long sleeves, pants, a wide-brimmed hat, and sunglasses year round, whenever you are outdoors.  Once a month, do a whole body skin exam, using a mirror to look at the skin on your back. Tell your health care provider of new moles, moles that have irregular borders, moles that  are larger than a pencil eraser, or moles that have changed in shape or color.  Stay current with required vaccines (immunizations).  Influenza vaccine. All adults should be immunized every year.  Tetanus, diphtheria, and acellular pertussis (Td, Tdap) vaccine. Pregnant women should receive 1 dose of Tdap vaccine during each pregnancy. The dose should be obtained regardless of the length of time since the last dose. Immunization is preferred during the 27th-36th week of gestation. An adult who has not previously received Tdap or who does not know her vaccine status should receive 1 dose of Tdap. This initial dose should be followed by tetanus and diphtheria toxoids (Td) booster doses every 10 years. Adults with an unknown or incomplete history of completing a 3-dose immunization series with Td-containing vaccines should begin or complete a primary immunization series including a Tdap dose. Adults should receive a Td booster every 10 years.  Varicella vaccine. An adult without evidence of immunity to varicella should receive 2 doses or a second dose if she has previously received 1 dose. Pregnant females who do not have evidence of immunity should receive the first dose after pregnancy. This first dose should be obtained before leaving the health care facility. The second dose should be obtained 4-8 weeks after the first dose.  Human papillomavirus (HPV) vaccine. Females aged 13-26 years who have not received the vaccine previously should obtain the 3-dose series. The vaccine is not recommended for use in pregnant females. However, pregnancy testing is not needed before receiving a dose. If a female is found to be pregnant after receiving a dose, no treatment is needed. In that case, the remaining doses should be delayed until after the pregnancy. Immunization is recommended for any person with an immunocompromised condition through the age of 61 years if she did not get any or all doses earlier. During the  3-dose series, the second dose should be obtained 4-8 weeks after the first dose. The third dose should be obtained 24 weeks after the first dose and 16 weeks after the second dose.  Zoster vaccine. One dose is recommended for adults aged 30 years or older unless certain conditions are present.  Measles, mumps, and rubella (MMR) vaccine. Adults born  before 1957 generally are considered immune to measles and mumps. Adults born in 1957 or later should have 1 or more doses of MMR vaccine unless there is a contraindication to the vaccine or there is laboratory evidence of immunity to each of the three diseases. A routine second dose of MMR vaccine should be obtained at least 28 days after the first dose for students attending postsecondary schools, health care workers, or international travelers. People who received inactivated measles vaccine or an unknown type of measles vaccine during 1963-1967 should receive 2 doses of MMR vaccine. People who received inactivated mumps vaccine or an unknown type of mumps vaccine before 1979 and are at high risk for mumps infection should consider immunization with 2 doses of MMR vaccine. For females of childbearing age, rubella immunity should be determined. If there is no evidence of immunity, females who are not pregnant should be vaccinated. If there is no evidence of immunity, females who are pregnant should delay immunization until after pregnancy. Unvaccinated health care workers born before 1957 who lack laboratory evidence of measles, mumps, or rubella immunity or laboratory confirmation of disease should consider measles and mumps immunization with 2 doses of MMR vaccine or rubella immunization with 1 dose of MMR vaccine.  Pneumococcal 13-valent conjugate (PCV13) vaccine. When indicated, a person who is uncertain of his immunization history and has no record of immunization should receive the PCV13 vaccine. All adults 65 years of age and older should receive this  vaccine. An adult aged 19 years or older who has certain medical conditions and has not been previously immunized should receive 1 dose of PCV13 vaccine. This PCV13 should be followed with a dose of pneumococcal polysaccharide (PPSV23) vaccine. Adults who are at high risk for pneumococcal disease should obtain the PPSV23 vaccine at least 8 weeks after the dose of PCV13 vaccine. Adults older than 80 years of age who have normal immune system function should obtain the PPSV23 vaccine dose at least 1 year after the dose of PCV13 vaccine.  Pneumococcal polysaccharide (PPSV23) vaccine. When PCV13 is also indicated, PCV13 should be obtained first. All adults aged 65 years and older should be immunized. An adult younger than age 65 years who has certain medical conditions should be immunized. Any person who resides in a nursing home or long-term care facility should be immunized. An adult smoker should be immunized. People with an immunocompromised condition and certain other conditions should receive both PCV13 and PPSV23 vaccines. People with human immunodeficiency virus (HIV) infection should be immunized as soon as possible after diagnosis. Immunization during chemotherapy or radiation therapy should be avoided. Routine use of PPSV23 vaccine is not recommended for American Indians, [place] Natives, or people younger than 65 years unless there are medical conditions that require PPSV23 vaccine. When indicated, people who have unknown immunization and have no record of immunization should receive PPSV23 vaccine. One-time revaccination 5 years after the first dose of PPSV23 is recommended for people aged 19-64 years who have chronic kidney failure, nephrotic syndrome, asplenia, or immunocompromised conditions. People who received 1-2 doses of PPSV23 before age 65 years should receive another dose of PPSV23 vaccine at age 65 years or later if at least 5 years have passed since the previous dose. Doses of PPSV23 are not  needed for people immunized with PPSV23 at or after age 65 years.  Meningococcal vaccine. Adults with asplenia or persistent complement component deficiencies should receive 2 doses of quadrivalent meningococcal conjugate (MenACWY-D) vaccine. The doses should be obtained   at least 2 months apart. Microbiologists working with certain meningococcal bacteria, [place] recruits, people at risk during an outbreak, and people who travel to or live in countries with a high rate of meningitis should be immunized. A first-year college student up through age 34 years who is living in a residence hall should receive a dose if she did not receive a dose on or after her 16th birthday. Adults who have certain high-risk conditions should receive one or more doses of vaccine.  Hepatitis A vaccine. Adults who wish to be protected from this disease, have certain high-risk conditions, work with hepatitis A-infected animals, work in hepatitis A research labs, or travel to or work in countries with a high rate of hepatitis A should be immunized. Adults who were previously unvaccinated and who anticipate close contact with an international adoptee during the first 60 days after arrival in the [place] from a country with a high rate of hepatitis A should be immunized.  Hepatitis B vaccine. Adults who wish to be protected from this disease, have certain high-risk conditions, may be exposed to blood or other infectious body fluids, are household contacts or sex partners of hepatitis B positive people, are clients or workers in certain care facilities, or travel to or work in countries with a high rate of hepatitis B should be immunized.  Haemophilus influenzae type b (Hib) vaccine. A previously unvaccinated person with asplenia or sickle cell disease or having a scheduled splenectomy should receive 1 dose of Hib vaccine. Regardless of previous immunization, a recipient of a hematopoietic stem cell transplant should receive a  3-dose series 6-12 months after her successful transplant. Hib vaccine is not recommended for adults with HIV infection. Preventive Services / Frequency Ages 35 to 4 years  Blood pressure check.** / Every 3-5 years.  Lipid and cholesterol check.** / Every 5 years beginning at age 60.  Clinical breast exam.** / Every 3 years for women in their 71s and 10s.  BRCA-related cancer risk assessment.** / For women who have family members with a BRCA-related cancer (breast, ovarian, tubal, or peritoneal cancers).  Pap test.** / Every 2 years from ages 76 through 26. Every 3 years starting at age 61 through age 76 or 93 with a history of 3 consecutive normal Pap tests.  HPV screening.** / Every 3 years from ages 37 through ages 60 to 51 with a history of 3 consecutive normal Pap tests.  Hepatitis C blood test.** / For any individual with known risks for hepatitis C.  Skin self-exam. / Monthly.  Influenza vaccine. / Every year.  Tetanus, diphtheria, and acellular pertussis (Tdap, Td) vaccine.** / Consult your health care provider. Pregnant women should receive 1 dose of Tdap vaccine during each pregnancy. 1 dose of Td every 10 years.  Varicella vaccine.** / Consult your health care provider. Pregnant females who do not have evidence of immunity should receive the first dose after pregnancy.  HPV vaccine. / 3 doses over 6 months, if 93 and younger. The vaccine is not recommended for use in pregnant females. However, pregnancy testing is not needed before receiving a dose.  Measles, mumps, rubella (MMR) vaccine.** / You need at least 1 dose of MMR if you were born in 1957 or later. You may also need a 2nd dose. For females of childbearing age, rubella immunity should be determined. If there is no evidence of immunity, females who are not pregnant should be vaccinated. If there is no evidence of immunity, females who are  pregnant should delay immunization until after pregnancy.  Pneumococcal  13-valent conjugate (PCV13) vaccine.** / Consult your health care provider.  Pneumococcal polysaccharide (PPSV23) vaccine.** / 1 to 2 doses if you smoke cigarettes or if you have certain conditions.  Meningococcal vaccine.** / 1 dose if you are age 68 to 8 years and a Market researcher living in a residence hall, or have one of several medical conditions, you need to get vaccinated against meningococcal disease. You may also need additional booster doses.  Hepatitis A vaccine.** / Consult your health care provider.  Hepatitis B vaccine.** / Consult your health care provider.  Haemophilus influenzae type b (Hib) vaccine.** / Consult your health care provider. Ages 7 to 53 years  Blood pressure check.** / Every year.  Lipid and cholesterol check.** / Every 5 years beginning at age 25 years.  Lung cancer screening. / Every year if you are aged 11-80 years and have a 30-pack-year history of smoking and currently smoke or have quit within the past 15 years. Yearly screening is stopped once you have quit smoking for at least 15 years or develop a health problem that would prevent you from having lung cancer treatment.  Clinical breast exam.** / Every year after age 48 years.  BRCA-related cancer risk assessment.** / For women who have family members with a BRCA-related cancer (breast, ovarian, tubal, or peritoneal cancers).  Mammogram.** / Every year beginning at age 41 years and continuing for as long as you are in good health. Consult with your health care provider.  Pap test.** / Every 3 years starting at age 65 years through age 37 or 70 years with a history of 3 consecutive normal Pap tests.  HPV screening.** / Every 3 years from ages 72 years through ages 60 to 40 years with a history of 3 consecutive normal Pap tests.  Fecal occult blood test (FOBT) of stool. / Every year beginning at age 21 years and continuing until age 5 years. You may not need to do this test if you get  a colonoscopy every 10 years.  Flexible sigmoidoscopy or colonoscopy.** / Every 5 years for a flexible sigmoidoscopy or every 10 years for a colonoscopy beginning at age 35 years and continuing until age 48 years.  Hepatitis C blood test.** / For all people born from [birth year] through 1965 and any individual with known risks for hepatitis C.  Skin self-exam. / Monthly.  Influenza vaccine. / Every year.  Tetanus, diphtheria, and acellular pertussis (Tdap/Td) vaccine.** / Consult your health care provider. Pregnant women should receive 1 dose of Tdap vaccine during each pregnancy. 1 dose of Td every 10 years.  Varicella vaccine.** / Consult your health care provider. Pregnant females who do not have evidence of immunity should receive the first dose after pregnancy.  Zoster vaccine.** / 1 dose for adults aged 30 years or older.  Measles, mumps, rubella (MMR) vaccine.** / You need at least 1 dose of MMR if you were born in 1957 or later. You may also need a second dose. For females of childbearing age, rubella immunity should be determined. If there is no evidence of immunity, females who are not pregnant should be vaccinated. If there is no evidence of immunity, females who are pregnant should delay immunization until after pregnancy.  Pneumococcal 13-valent conjugate (PCV13) vaccine.** / Consult your health care provider.  Pneumococcal polysaccharide (PPSV23) vaccine.** / 1 to 2 doses if you smoke cigarettes or if you have certain conditions.  Meningococcal vaccine.** /  Consult your health care provider.  Hepatitis A vaccine.** / Consult your health care provider.  Hepatitis B vaccine.** / Consult your health care provider.  Haemophilus influenzae type b (Hib) vaccine.** / Consult your health care provider. Ages 64 years and over  Blood pressure check.** / Every year.  Lipid and cholesterol check.** / Every 5 years beginning at age 23 years.  Lung cancer screening. / Every year if you  are aged 16-80 years and have a 30-pack-year history of smoking and currently smoke or have quit within the past 15 years. Yearly screening is stopped once you have quit smoking for at least 15 years or develop a health problem that would prevent you from having lung cancer treatment.  Clinical breast exam.** / Every year after age 74 years.  BRCA-related cancer risk assessment.** / For women who have family members with a BRCA-related cancer (breast, ovarian, tubal, or peritoneal cancers).  Mammogram.** / Every year beginning at age 44 years and continuing for as long as you are in good health. Consult with your health care provider.  Pap test.** / Every 3 years starting at age 58 years through age 22 or 39 years with 3 consecutive normal Pap tests. Testing can be stopped between 65 and 70 years with 3 consecutive normal Pap tests and no abnormal Pap or HPV tests in the past 10 years.  HPV screening.** / Every 3 years from ages 64 years through ages 70 or 61 years with a history of 3 consecutive normal Pap tests. Testing can be stopped between 65 and 70 years with 3 consecutive normal Pap tests and no abnormal Pap or HPV tests in the past 10 years.  Fecal occult blood test (FOBT) of stool. / Every year beginning at age 40 years and continuing until age 27 years. You may not need to do this test if you get a colonoscopy every 10 years.  Flexible sigmoidoscopy or colonoscopy.** / Every 5 years for a flexible sigmoidoscopy or every 10 years for a colonoscopy beginning at age 7 years and continuing until age 32 years.  Hepatitis C blood test.** / For all people born from [birth year] through 1965 and any individual with known risks for hepatitis C.  Osteoporosis screening.** / A one-time screening for women ages 30 years and over and women at risk for fractures or osteoporosis.  Skin self-exam. / Monthly.  Influenza vaccine. / Every year.  Tetanus, diphtheria, and acellular pertussis (Tdap/Td)  vaccine.** / 1 dose of Td every 10 years.  Varicella vaccine.** / Consult your health care provider.  Zoster vaccine.** / 1 dose for adults aged 35 years or older.  Pneumococcal 13-valent conjugate (PCV13) vaccine.** / Consult your health care provider.  Pneumococcal polysaccharide (PPSV23) vaccine.** / 1 dose for all adults aged 46 years and older.  Meningococcal vaccine.** / Consult your health care provider.  Hepatitis A vaccine.** / Consult your health care provider.  Hepatitis B vaccine.** / Consult your health care provider.  Haemophilus influenzae type b (Hib) vaccine.** / Consult your health care provider. ** Family history and personal history of risk and conditions may change your health care provider's recommendations.   This information is not intended to replace advice given to you by your health care provider. Make sure you discuss any questions you have with your health care provider.   Document Released: 05/09/2001 Document Revised: 04/03/2014 Document Reviewed: 08/08/2010 Elsevier Interactive Patient Education [employer].

## 2015-08-16 NOTE — Telephone Encounter (Signed)
Labs are ordered 

## 2015-08-16 NOTE — Progress Notes (Signed)
Pre visit review using our clinic review tool, if applicable. No additional management support is needed unless otherwise documented below in the visit note. 

## 2015-08-17 ENCOUNTER — Other Ambulatory Visit (INDEPENDENT_AMBULATORY_CARE_PROVIDER_SITE_OTHER): Payer: PPO

## 2015-08-17 DIAGNOSIS — B009 Herpesviral infection, unspecified: Secondary | ICD-10-CM

## 2015-08-18 ENCOUNTER — Encounter: Payer: Self-pay | Admitting: *Deleted

## 2015-08-18 ENCOUNTER — Telehealth: Payer: Self-pay | Admitting: Behavioral Health

## 2015-08-18 DIAGNOSIS — Z961 Presence of intraocular lens: Secondary | ICD-10-CM | POA: Diagnosis not present

## 2015-08-18 DIAGNOSIS — H4720 Unspecified optic atrophy: Secondary | ICD-10-CM | POA: Diagnosis not present

## 2015-08-18 DIAGNOSIS — H5213 Myopia, bilateral: Secondary | ICD-10-CM | POA: Diagnosis not present

## 2015-08-18 DIAGNOSIS — E871 Hypo-osmolality and hyponatremia: Secondary | ICD-10-CM

## 2015-08-18 LAB — CBC
HCT: 35.4 % — ABNORMAL LOW (ref 36.0–46.0)
Hemoglobin: 11.7 g/dL — ABNORMAL LOW (ref 12.0–15.0)
MCHC: 33 g/dL (ref 30.0–36.0)
MCV: 94.8 fl (ref 78.0–100.0)
Platelets: 248 10*3/uL (ref 150.0–400.0)
RBC: 3.74 Mil/uL — ABNORMAL LOW (ref 3.87–5.11)
RDW: 13.8 % (ref 11.5–15.5)
WBC: 10 10*3/uL (ref 4.0–10.5)

## 2015-08-18 LAB — LIPID PANEL
Cholesterol: 153 mg/dL (ref 0–200)
HDL: 40.2 mg/dL (ref >39.00)
NonHDL: 112.85
Total CHOL/HDL Ratio: 4
Triglycerides: 316 mg/dL — ABNORMAL HIGH (ref 0.0–149.0)
VLDL: 63.2 mg/dL — ABNORMAL HIGH (ref 0.0–40.0)

## 2015-08-18 LAB — COMPREHENSIVE METABOLIC PANEL
ALT: 11 U/L (ref 0–35)
AST: 13 U/L (ref 0–37)
Albumin: 4.3 g/dL (ref 3.5–5.2)
Alkaline Phosphatase: 41 U/L (ref 39–117)
BUN: 20 mg/dL (ref 6–23)
CO2: 29 mEq/L (ref 19–32)
Calcium: 9.4 mg/dL (ref 8.4–10.5)
Chloride: 98 mEq/L (ref 96–112)
Creatinine, Ser: 0.48 mg/dL (ref 0.40–1.20)
GFR: 132.16 mL/min (ref >60.00)
Glucose, Bld: 81 mg/dL (ref 70–99)
Potassium: 3.9 mEq/L (ref 3.5–5.1)
Sodium: 132 mEq/L — ABNORMAL LOW (ref 135–145)
Total Bilirubin: 0.3 mg/dL (ref 0.2–1.2)
Total Protein: 6.9 g/dL (ref 6.0–8.3)

## 2015-08-18 LAB — TSH: TSH: 1.2 u[IU]/mL (ref 0.35–4.50)

## 2015-08-18 LAB — LDL CHOLESTEROL, DIRECT: Direct LDL: 66 mg/dL

## 2015-08-18 LAB — VITAMIN B12: Vitamin B-12: 344 pg/mL (ref 211–911)

## 2015-08-18 MED ORDER — FENOFIBRATE MICRONIZED 130 MG PO CAPS: 130.0000 mg | ORAL_CAPSULE | Freq: Every day | ORAL | Status: DC

## 2015-08-18 MED FILL — FENOFIBRATE 130 MG CAPSULE: 130 | 30 days supply | Qty: 30 | Fill #0

## 2015-08-18 NOTE — Telephone Encounter (Signed)
Patient made aware of the below results and provider's recommendations.   Notes Recorded by Mosie Lukes, MD on 08/18/2015 at 1:42 PM Notify anemia mild and stable. Increase leafy greens, consider increased lean red meat and using cast iron cookware. Continue to monitor, report any concerns. Triglycerides up considerably, minimize simple carbohydrates, increase exercise and start Fenofibrate 130 mg po daily disp #30 with 2rf if willing. Also sodium slightly low. The treatment for this is to decrease fluid intake by one beverage daily and recheck cmp in 2-4 weeks  She verbalized understanding and did not have any questions or concerns prior to call ending. Rx has been sent to the patient's preferred pharmacy. Lab order placed for CMP & appointment scheduled for 09/14/15 at 2:00 PM.

## 2015-08-23 ENCOUNTER — Encounter: Payer: Self-pay | Admitting: Family Medicine

## 2015-08-23 DIAGNOSIS — K219 Gastro-esophageal reflux disease without esophagitis: Secondary | ICD-10-CM

## 2015-08-23 DIAGNOSIS — N390 Urinary tract infection, site not specified: Secondary | ICD-10-CM

## 2015-08-23 HISTORY — DX: Gastro-esophageal reflux disease without esophagitis: K21.9

## 2015-08-23 HISTORY — DX: Urinary tract infection, site not specified: N39.0

## 2015-08-23 NOTE — Assessment & Plan Note (Signed)
Encouraged heart healthy diet, increase exercise, avoid trans fats, consider a krill oil cap daily 

## 2015-08-23 NOTE — Assessment & Plan Note (Signed)
Avoid offending foods, start probiotics. Do not eat large meals in late evening and consider raising head of bed.  

## 2015-08-23 NOTE — Assessment & Plan Note (Signed)
Encouraged to get adequate exercise, calcium and vitamin d intake 

## 2015-08-23 NOTE — Assessment & Plan Note (Signed)
Follows with [facility], maintains prophylactic antibiotics.

## 2015-08-23 NOTE — Assessment & Plan Note (Addendum)
Follows with Dr Joya Salm, Dr Nelva Bush of pain management and is stable

## 2015-08-23 NOTE — Progress Notes (Signed)
Patient ID: Alexis Olson, female   DOB: [birth year], 82 y.o.   MRN: [id number]   Subjective:    Patient ID: Alexis Olson, female    DOB: 12/28/1933, 82 y.o.   MRN: [id number]  Chief Complaint  Patient presents with  . Establish Care    HPI Patient is in today for follow up. No recent illness but struggles with chronic pain in her back. She manages day to day. No recent hospitalization. Denies CP/palp/SOB/HA/congestion/fevers/GI or GU c/o. Taking meds as prescribed  Past Medical History  Diagnosis Date  . Obesity   . Lumbosacral disc disease     Chronic pain; History of osteomyelitis 2010 following ESI complication  . ALLERGIC RHINITIS   . 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 ANXIETY   . ASTHMA   . Bariatric surgery status   . COLONIC POLYPS, ADENOMATOUS, HX OF 2010  . DIVERTICULOSIS, COLON   . INSOMNIA   . URINARY URGENCY   . VITAMIN B12 DEFICIENCY   . Hypertension   . Colon perforation ([place]) 08/2014    following polypetomy during colo, surgical repair  . HSV (herpes simplex virus) infection 08/16/2015  . Recurrent UTI 08/23/2015  . GERD (gastroesophageal reflux disease) 08/23/2015    Past Surgical History  Procedure Laterality Date  . Cholecystectomy    . Tonsillectomy    . Ileojejunal bypass  1976    for obesity  . Back surgery  1994    for ruptured disc  . Laparotomy N/A 09/18/2014    Procedure: EXPLORATORY LAPAROTOMY WITH REPAIR OF CECIAL PERFORATION;  Surgeon: Autumn Messing III, MD;  Location: [facility];  Service: General;  Laterality: N/A;    Family History  Problem Relation Age of Onset  . Cervical cancer Mother   . Stroke Mother   . Liver disease Sister   . Kidney disease Sister   . Cirrhosis Sister   . Colon cancer Neg Hx   . Esophageal cancer Neg Hx   . Rectal cancer Neg Hx   . Stomach cancer Neg Hx     Social History   Social History  . Marital Status: Married    Spouse Name: N/A  . Number of Children: 3  .  Years of Education: N/A   Occupational History  . retired Therapist, sports    Social History Main Topics  . Smoking status: Never Smoker   . Smokeless tobacco: Never Used  . Alcohol Use: No  . Drug Use: No  . Sexual Activity: Not on file   Other Topics Concern  . Not on file   Social History Narrative   Lives with spouse, Indep ADLs   Supportive family nearby   No dietary restrictions    Outpatient Prescriptions Prior to Visit  Medication Sig Dispense Refill  . acetaminophen-codeine (TYLENOL #3) 300-30 MG tablet Take 1 tablet by mouth every 8 (eight) hours as needed for moderate pain or severe pain. 30 tablet 0  . albuterol (PROVENTIL HFA;VENTOLIN HFA) 108 (90 BASE) MCG/ACT inhaler Inhale 1-2 puffs into the lungs every 6 (six) hours as needed for wheezing or shortness of breath.    . ALPRAZolam (XANAX) 1 MG tablet Take 0.5-1 tablets (0.5-1 mg total) by mouth at bedtime as needed. 30 tablet 5  . amitriptyline (ELAVIL) 100 MG tablet Take 1 tablet by mouth daily Must keep appt in May w/new PCP for future refills 90 tablet 0  . azelastine (OPTIVAR) 0.05 % ophthalmic  solution Place 1 drop into both eyes daily as needed (dry eyes).     . cetirizine (ZYRTEC) 10 MG tablet Take 10 mg by mouth daily.     . cholecalciferol (VITAMIN D) 1000 UNITS tablet Take 1,000 Units by mouth daily.    . cyanocobalamin (,VITAMIN B-12,) 1000 MCG/ML injection INJECT 1 ML (1,000 MCG TOTAL) INTO THE MUSCLE EVERY 30 (THIRTY) DAYS. 6 mL 0  . dicyclomine (BENTYL) 10 MG capsule Take 1 capsule (10 mg total) by mouth 3 (three) times daily. 270 capsule 3  . diphenoxylate-atropine (LOMOTIL) 2.5-0.025 MG tablet Take 2 tablets by mouth 3 (three) times daily as needed for diarrhea or loose stools. 150 tablet 3  . Fe Fum-FA-B Cmp-C-Zn-Mg-Mn-Cu (HEMOCYTE PLUS) 106-1 MG CAPS Take 1 capsule by mouth 2 (two) times daily. 60 each 11  . fluticasone (FLONASE) 50 MCG/ACT nasal spray 2 sprays by Nasal route daily.      Marland Kitchen gabapentin (NEURONTIN)  300 MG capsule Take 1 capsule (300 mg total) by mouth at bedtime. 90 capsule 3  . hydrOXYzine (ATARAX) 25 MG tablet Take 25 mg by mouth 3 (three) times daily as needed for itching.     Marland Kitchen KLOR-CON M20 20 MEQ tablet TAKE 1 TABLET BY MOUTH ONCE DAILY 30 tablet 1  . levocetirizine (XYZAL) 5 MG tablet Take 1 tablet (5 mg total) by mouth every evening. 90 tablet 3  . magnesium oxide (MAG-OX 400) 400 MG tablet Take 400 mg by mouth 2 (two) times daily.     . methocarbamol (ROBAXIN) 500 MG tablet Take 1 tablet (500 mg total) by mouth 4 (four) times daily as needed (use for muscle cramps/pain). 40 tablet 1  . montelukast (SINGULAIR) 10 MG tablet Take 10 mg by mouth at bedtime.     Marland Kitchen omeprazole (PRILOSEC) 20 MG capsule Take 1 capsule (20 mg total) by mouth daily. 90 capsule 3  . PX ENTERIC ASPIRIN 81 MG EC tablet Take 81 mg by mouth daily.     . valACYclovir (VALTREX) 500 MG tablet Take 500 mg by mouth as needed.      . vitamin E 400 UNIT capsule Take 400 Units by mouth daily.      . zafirlukast (ACCOLATE) 20 MG tablet Take 20 mg by mouth daily as needed (for breathing). Take 1 by mouth twice a day    . zolpidem (AMBIEN) 5 MG tablet Take 1 tablet (5 mg total) by mouth at bedtime. 30 tablet 0  . doxycycline (VIBRAMYCIN) 100 MG capsule Take 1 capsule (100 mg total) by mouth 2 (two) times daily. 20 capsule 0  . fluconazole (DIFLUCAN) 150 MG tablet Take 1 tablet (150 mg total) by mouth once. Take 2nd pill if symptoms not completely resolved in 3 days. 2 tablet 0  . predniSONE (DELTASONE) 50 MG tablet Take 1 pill daily for 5 days. 5 tablet 0  . promethazine-codeine (PHENERGAN WITH CODEINE) 6.25-10 MG/5ML syrup Take 5 mLs by mouth every 6 (six) hours as needed for cough. 120 mL 0   No facility-administered medications prior to visit.    Allergies  Allergen Reactions  . Other Anaphylaxis    Red peppers--choking  . Azithromycin Other (See Comments)    diarrhea  . Benadryl [Diphenhydramine Hcl] Other (See  Comments)    Paradoxical response.  . Ciprofloxacin Hcl     REACTION: causes yeast inf  and refuses to take  . Codeine Phosphate     REACTION: unspecified  . Hydrocodone Itching and Nausea And  Vomiting  . Hydrocodone-Homatropine Itching and Nausea And Vomiting    REACTION: unspecified  . Levaquin [Levofloxacin In D5w] Other (See Comments)    Muscle soreness  . Meperidine Hcl   . Sulfa Antibiotics Other (See Comments)    Joint pain  . Ultram [Tramadol Hcl] Other (See Comments)    Can't move joints    Review of Systems  Constitutional: Negative for fever and malaise/fatigue.  HENT: Negative for congestion.   Eyes: Negative for blurred vision.  Respiratory: Negative for shortness of breath.   Cardiovascular: Negative for chest pain, palpitations and leg swelling.  Gastrointestinal: Negative for nausea, abdominal pain and blood in stool.  Genitourinary: Positive for frequency. Negative for dysuria.  Musculoskeletal: Positive for back pain. Negative for falls.  Skin: Negative for rash.  Neurological: Negative for dizziness, loss of consciousness and headaches.  Endo/Heme/Allergies: Negative for environmental allergies.  Psychiatric/Behavioral: Negative for depression. The patient is nervous/anxious.        Objective:    Physical Exam  Constitutional: She is oriented to person, place, and time. She appears well-developed and well-nourished. No distress.  HENT:  Head: Normocephalic and atraumatic.  Right Ear: External ear normal.  Left Ear: External ear normal.  Nose: Nose normal.  Mouth/Throat: Oropharynx is clear and moist.  Eyes: Conjunctivae and EOM are normal. Pupils are equal, round, and reactive to light. Right eye exhibits no discharge. Left eye exhibits no discharge.  Neck: Normal range of motion. Neck supple. No JVD present. No thyromegaly present.  Cardiovascular: Normal rate, regular rhythm, normal heart sounds and intact distal pulses.   No murmur  heard. Pulmonary/Chest: Effort normal and breath sounds normal. No respiratory distress. She has no wheezes. She has no rales. She exhibits no tenderness.  Abdominal: Soft. Bowel sounds are normal. She exhibits no distension and no mass. There is no tenderness. There is no rebound and no guarding.  Musculoskeletal: Normal range of motion. She exhibits no edema or tenderness.  Lymphadenopathy:    She has no cervical adenopathy.  Neurological: She is alert and oriented to person, place, and time. She has normal reflexes. No cranial nerve deficit.  Skin: Skin is warm and dry. No rash noted. She is not diaphoretic. No erythema.  Psychiatric: She has a normal mood and affect. Her behavior is normal. Judgment and thought content normal.  Nursing note and vitals reviewed.   BP 118/72 mmHg  Pulse 78  Temp(Src) 98.3 F (36.8 C) (Oral)  Ht 5\' 5"  (1.651 m)  Wt 187 lb 6 oz (84.993 kg)  BMI 31.18 kg/m2  SpO2 93% Wt Readings from Last 3 Encounters:  08/16/15 187 lb 6 oz (84.993 kg)  05/19/15 188 lb 6.4 oz (85.458 kg)  02/22/15 190 lb (86.183 kg)     Lab Results  Component Value Date   WBC 10.0 08/17/2015   HGB 11.7* 08/17/2015   HCT 35.4* 08/17/2015   PLT 248.0 08/17/2015   GLUCOSE 81 08/17/2015   CHOL 153 08/17/2015   TRIG 316.0* 08/17/2015   HDL 40.20 08/17/2015   LDLDIRECT 66.0 08/17/2015   LDLCALC 92 12/27/2010   ALT 11 08/17/2015   AST 13 08/17/2015   NA 132* 08/17/2015   K 3.9 08/17/2015   CL 98 08/17/2015   CREATININE 0.48 08/17/2015   BUN 20 08/17/2015   CO2 29 08/17/2015   TSH 1.20 08/17/2015   INR 1.01 05/13/2009   HGBA1C 5.3 08/03/2006    Lab Results  Component Value Date   TSH 1.20 08/17/2015  Lab Results  Component Value Date   WBC 10.0 08/17/2015   HGB 11.7* 08/17/2015   HCT 35.4* 08/17/2015   MCV 94.8 08/17/2015   PLT 248.0 08/17/2015   Lab Results  Component Value Date   NA 132* 08/17/2015   K 3.9 08/17/2015   CO2 29 08/17/2015   GLUCOSE 81  08/17/2015   BUN 20 08/17/2015   CREATININE 0.48 08/17/2015   BILITOT 0.3 08/17/2015   ALKPHOS 41 08/17/2015   AST 13 08/17/2015   ALT 11 08/17/2015   PROT 6.9 08/17/2015   ALBUMIN 4.3 08/17/2015   CALCIUM 9.4 08/17/2015   ANIONGAP 9 09/26/2014   GFR 132.16 08/17/2015   Lab Results  Component Value Date   CHOL 153 08/17/2015   Lab Results  Component Value Date   HDL 40.20 08/17/2015   Lab Results  Component Value Date   LDLCALC 92 12/27/2010   Lab Results  Component Value Date   TRIG 316.0* 08/17/2015   Lab Results  Component Value Date   CHOLHDL 4 08/17/2015   Lab Results  Component Value Date   HGBA1C 5.3 08/03/2006       Assessment & Plan:   Problem List Items Addressed This Visit    Vitamin B 12 deficiency    encouraged to continue treatment and will continue to monitor      Relevant Medications   fenofibrate micronized (ANTARA) 130 MG capsule   Other Relevant Orders   CBC (Completed)   TSH (Completed)   Lipid panel (Completed)   Comprehensive metabolic panel (Completed)   Vitamin B12 (Completed)   Recurrent UTI    Follows with [facility], maintains prophylactic antibiotics.       Osteopenia, senile    Encouraged to get adequate exercise, calcium and vitamin d intake      INSOMNIA   Relevant Medications   fenofibrate micronized (ANTARA) 130 MG capsule   Other Relevant Orders   CBC (Completed)   TSH (Completed)   Lipid panel (Completed)   Comprehensive metabolic panel (Completed)   Vitamin B12 (Completed)   Hyperlipidemia, mild    Encouraged heart healthy diet, increase exercise, avoid trans fats, consider a krill oil cap daily      Relevant Medications   fenofibrate micronized (ANTARA) 130 MG capsule   Other Relevant Orders   CBC (Completed)   TSH (Completed)   Lipid panel (Completed)   Comprehensive metabolic panel (Completed)   Vitamin B12 (Completed)   HSV (herpes simplex virus) infection - Primary   Relevant Medications    fenofibrate micronized (ANTARA) 130 MG capsule   Other Relevant Orders   CBC (Completed)   TSH (Completed)   Lipid panel (Completed)   Comprehensive metabolic panel (Completed)   Vitamin B12 (Completed)   GERD (gastroesophageal reflux disease)    Avoid offending foods, start probiotics. Do not eat large meals in late evening and consider raising head of bed.       DISC DISEASE, LUMBOSACRAL SPINE    Follows with Dr Joya Salm, Dr Nelva Bush of pain management and is stable      Chronic diarrhea   Relevant Medications   fenofibrate micronized (ANTARA) 130 MG capsule   Other Relevant Orders   CBC (Completed)   TSH (Completed)   Lipid panel (Completed)   Comprehensive metabolic panel (Completed)   Vitamin B12 (Completed)   Anemia (Chronic)    Mild, will continue to ,omitor      Relevant Medications   fenofibrate micronized (ANTARA) 130 MG capsule  Other Relevant Orders   CBC (Completed)   TSH (Completed)   Lipid panel (Completed)   Comprehensive metabolic panel (Completed)   Vitamin B12 (Completed)      I have discontinued Ms. Sanks's fluconazole, doxycycline, predniSONE, promethazine-codeine, and fenofibrate micronized. I am also having her start on fenofibrate micronized. Additionally, I am having her maintain her vitamin E, fluticasone, hydrOXYzine, magnesium oxide, azelastine, valACYclovir, zafirlukast, montelukast, PX ENTERIC ASPIRIN, cetirizine, levocetirizine, cholecalciferol, albuterol, cyanocobalamin, gabapentin, acetaminophen-codeine, ALPRAZolam, methocarbamol, zolpidem, diphenoxylate-atropine, HEMOCYTE PLUS, dicyclomine, KLOR-CON M20, omeprazole, and amitriptyline.  Meds ordered this encounter  Medications  . DISCONTD: fenofibrate micronized (ANTARA) 130 MG capsule    Sig: Take 130 mg by mouth daily before breakfast.  . fenofibrate micronized (ANTARA) 130 MG capsule    Sig: Take 1 capsule (130 mg total) by mouth daily.    Dispense:  30 capsule    Refill:  2      Penni Homans, MD

## 2015-08-23 NOTE — Assessment & Plan Note (Signed)
Mild, will continue to ,omitor

## 2015-08-24 ENCOUNTER — Other Ambulatory Visit: Payer: Self-pay | Admitting: Family Medicine

## 2015-08-24 MED FILL — KLOR-CON M20 TABLET: 20 | 30 days supply | Qty: 30 | Fill #0

## 2015-09-08 MED FILL — ZOLPIDEM TARTRATE 5 MG TAB: 5 | 30 days supply | Qty: 30 | Fill #0

## 2015-09-08 MED FILL — AMITRIPTYLINE HCL 100 MG TA: 100 | 30 days supply | Qty: 30 | Fill #2

## 2015-09-09 ENCOUNTER — Telehealth: Payer: Self-pay

## 2015-09-09 MED ORDER — AMITRIPTYLINE HCL 100 MG PO TABS: ORAL_TABLET | ORAL | Status: DC

## 2015-09-09 MED ORDER — ALPRAZOLAM 1 MG PO TABS: 0.5000 mg | ORAL_TABLET | Freq: Every evening | ORAL | Status: DC | PRN

## 2015-09-09 MED FILL — ALPRAZolam 1 MG TABS: 1 | 90 days supply | Qty: 90 | Fill #0

## 2015-09-09 NOTE — Telephone Encounter (Signed)
Pt would also like a refill on Ambien.    Ambien 5 mg tablets Take 1 tablet (5 mg total) by mouth at bedtime. Last filled:  05/05/15 Amt: 30, 0  Please advise.

## 2015-09-09 NOTE — Telephone Encounter (Signed)
Noted. Rxs filled per provider orders.  Meds called into the pharmacy.

## 2015-09-09 NOTE — Telephone Encounter (Signed)
OK to refill Ambien #30 no RF as well

## 2015-09-09 NOTE — Telephone Encounter (Signed)
She can have a refill on both meds, 90 day supply on both with 1 further refill

## 2015-09-09 NOTE — Telephone Encounter (Signed)
Pharmacy request received via fax  Alprazolam 1 mg tablet Take 0.5-1 tablets (0.5-1 mg total) by mouth at bedtime as needed. Last filled:  02/22/15 Amt: 30, 5 Last OV:  08/16/15 No UDS or contract on file.    Pt requesting a 90 day supply.     Amitriptyline HCL 100 mg Last filled: 07/13/15 Amt:  90,0 Last OV:  08/16/15  Pt requesting a 90 day supply.   Please advise.

## 2015-09-10 MED ORDER — ZOLPIDEM TARTRATE 5 MG PO TABS: 5.0000 mg | ORAL_TABLET | Freq: Every day | ORAL | Status: DC

## 2015-09-10 NOTE — Telephone Encounter (Signed)
Phoned refill in to the pharmacy.  Pharm tech states pt just picked up medication 2 days ago (script written by Terri Piedra), but states he would take script and put it on hold for patient.

## 2015-09-12 ENCOUNTER — Other Ambulatory Visit: Payer: Self-pay | Admitting: Internal Medicine

## 2015-09-14 ENCOUNTER — Other Ambulatory Visit: Payer: Self-pay | Admitting: Family Medicine

## 2015-09-14 ENCOUNTER — Other Ambulatory Visit (INDEPENDENT_AMBULATORY_CARE_PROVIDER_SITE_OTHER): Payer: PPO

## 2015-09-14 DIAGNOSIS — Z79899 Other long term (current) drug therapy: Secondary | ICD-10-CM | POA: Diagnosis not present

## 2015-09-14 DIAGNOSIS — E871 Hypo-osmolality and hyponatremia: Secondary | ICD-10-CM

## 2015-09-14 NOTE — Telephone Encounter (Signed)
Advise if to stay on if so send back to me and will give her #90 day refill. Labs done in May 2017.

## 2015-09-14 NOTE — Telephone Encounter (Signed)
At check out pt mentioned that she had a question about fenofibrate micronized. First, she would like to know if she should continue taking medication, if so she would like to have a 90 day supply sent via mail order.

## 2015-09-15 ENCOUNTER — Telehealth: Payer: Self-pay | Admitting: Internal Medicine

## 2015-09-15 LAB — COMPLETE METABOLIC PANEL WITH GFR
ALT: 11 U/L (ref 6–29)
AST: 14 U/L (ref 10–35)
Albumin: 4.1 g/dL (ref 3.6–5.1)
Alkaline Phosphatase: 32 U/L — ABNORMAL LOW (ref 33–130)
BUN: 15 mg/dL (ref 7–25)
CO2: 23 mmol/L (ref 20–31)
Calcium: 8.9 mg/dL (ref 8.6–10.4)
Chloride: 101 mmol/L (ref 98–110)
Creat: 0.58 mg/dL — ABNORMAL LOW (ref 0.60–0.88)
GFR, Est African American: >89 mL/min (ref >=60)
GFR, Est Non African American: 87 mL/min (ref >=60)
Glucose, Bld: 70 mg/dL (ref 65–99)
Potassium: 3.7 mmol/L (ref 3.5–5.3)
Sodium: 136 mmol/L (ref 135–146)
Total Bilirubin: 0.3 mg/dL (ref 0.2–1.2)
Total Protein: 6.6 g/dL (ref 6.1–8.1)

## 2015-09-15 MED ORDER — CYANOCOBALAMIN 1000 MCG/ML IJ SOLN: INTRAMUSCULAR | Status: DC

## 2015-09-15 MED FILL — CEPHALEXIN 250 MG CAPSULE: 250 | 90 days supply | Qty: 90 | Fill #0

## 2015-09-15 MED FILL — CYANOCOBALAMIN 1,000 MCG/ML: 1000 | 90 days supply | Qty: 3 | Fill #0

## 2015-09-15 NOTE — Telephone Encounter (Signed)
Yes she should stay on it. Please send in refill

## 2015-09-15 NOTE — Telephone Encounter (Signed)
Advised patient of her lab results. Patient would like to know the cost of Fenofibrate if so. Wants to know if it is cheaper at [facility] or her mail order [facility]. States someone told her they would check. Advised patient I would try to find out and call her back.

## 2015-09-15 NOTE — Telephone Encounter (Signed)
Called patient advised she is to continue Fenofibrate 130 mg. [place] who states they would have to run medication order with her insurance in order to give a price.  [place] price for medication would be $65.05 for 90 tablets. Patient informed. States he will call her insurance.

## 2015-09-15 NOTE — Telephone Encounter (Signed)
Patient is requesting 3 or 6 refills sent in

## 2015-09-20 MED FILL — GABAPENTIN 300 MG CAPSULE: 300 | 90 days supply | Qty: 90 | Fill #3

## 2015-09-20 MED FILL — KLOR-CON M20 TABLET: 20 | 30 days supply | Qty: 30 | Fill #1

## 2015-09-21 ENCOUNTER — Other Ambulatory Visit: Payer: Self-pay | Admitting: Family Medicine

## 2015-09-21 MED FILL — FLUTICASONE PROP 50 MCG SPR: 50 | 30 days supply | Qty: 16 | Fill #0

## 2015-09-21 MED FILL — OMEPRAZOLE DR 20 MG CAPSULE: 20 | 90 days supply | Qty: 90 | Fill #1

## 2015-09-21 MED FILL — FENOFIBRATE 130 MG CAPSULE: 130 | 90 days supply | Qty: 90 | Fill #0

## 2015-09-21 NOTE — Telephone Encounter (Signed)
°  Relationship to patient: Self  Can be reached: [phone]   Pharmacy:  [facility], [place] [phone] (Phone) [phone] (Fax)        Reason for call: Request refill on fenofibrate micronized (ANTARA) 130 MG capsule [id number] for a 90 day supply

## 2015-09-24 ENCOUNTER — Telehealth: Payer: Self-pay | Admitting: Internal Medicine

## 2015-09-24 NOTE — Telephone Encounter (Signed)
She has had diarrhea all her life but over the past couple of months has developed constipation.  She has to strain so hard she begins to bleed.  She has increased her fluids. She is a former Marine scientist and thinks that something is wrong because of the change in bowels.   Pt is requesting a sooner appt than 11/30/15, She was offered an appt with an extender but she declined.  She would like for Dr Carlean Purl to call her.  Please advise

## 2015-09-27 NOTE — Telephone Encounter (Signed)
Patient calling in regarding this.  °

## 2015-09-29 NOTE — Telephone Encounter (Signed)
Patient is scheduled for 10/05/15 8:45 with Dr. Carlean Purl

## 2015-10-02 MED FILL — MONTELUKAST SOD 10 MG TAB: 10 | 90 days supply | Qty: 90 | Fill #1

## 2015-10-04 ENCOUNTER — Other Ambulatory Visit: Payer: Self-pay | Admitting: Family Medicine

## 2015-10-04 NOTE — Telephone Encounter (Signed)
Requesting:   zolpidem Contract  09/14/2015 UDS  Low risk, due Last OV   08/21/2015 Last Refill   #30 with 0 refills on 09/10/2015  Please Advise  Requesting a #90 day

## 2015-10-04 NOTE — Telephone Encounter (Signed)
Pt called in to request a refill on 2 medications. Pt is requesting a 90 day supply going forward with her refills. She says that it less expensive for her.   1. Zolpidem  2. Amitrityline   Pharmacy: [facility], [place]

## 2015-10-04 NOTE — Telephone Encounter (Signed)
She can have a 90 day supply on both meds requested.

## 2015-10-05 ENCOUNTER — Encounter: Payer: Self-pay | Admitting: Internal Medicine

## 2015-10-05 ENCOUNTER — Ambulatory Visit (INDEPENDENT_AMBULATORY_CARE_PROVIDER_SITE_OTHER): Payer: PPO | Admitting: Internal Medicine

## 2015-10-05 ENCOUNTER — Encounter: Payer: Self-pay | Admitting: Family Medicine

## 2015-10-05 VITALS — BP 120/80 | HR 93 | Ht 65.0 in | Wt 189.0 lb

## 2015-10-05 DIAGNOSIS — R194 Change in bowel habit: Secondary | ICD-10-CM

## 2015-10-05 DIAGNOSIS — R10819 Abdominal tenderness, unspecified site: Secondary | ICD-10-CM

## 2015-10-05 DIAGNOSIS — Z9884 Bariatric surgery status: Secondary | ICD-10-CM

## 2015-10-05 DIAGNOSIS — R1031 Right lower quadrant pain: Secondary | ICD-10-CM | POA: Diagnosis not present

## 2015-10-05 DIAGNOSIS — R1032 Left lower quadrant pain: Secondary | ICD-10-CM

## 2015-10-05 MED ORDER — ZOLPIDEM TARTRATE 5 MG PO TABS
5.0000 mg | ORAL_TABLET | Freq: Every day | ORAL | Status: DC
Start: 2015-10-05 — End: 2016-01-07

## 2015-10-05 MED ORDER — AMITRIPTYLINE HCL 100 MG PO TABS: ORAL_TABLET | ORAL | Status: DC

## 2015-10-05 MED FILL — AMITRIPTYLINE HCL 100 MG TA: 100 | 90 days supply | Qty: 90 | Fill #0

## 2015-10-05 NOTE — Progress Notes (Signed)
   Subjective:  Cc: new constipation  Patient ID: Alexis Olson, female    DOB: [birth year], 82 y.o.   MRN: [id number]  HPI Diarrhea/loose x years and now 1+ month constpated - some straining and rectal bleeding. Past few days tried stool softener and then cit Mg after felling bloated and distended and defecated 3AM and felt better Fenofibrate stopped after starting 1 month ago and sxs persisted Medications, allergies, past medical history, past surgical history, family history and social history are reviewed and updated in the EMR.  Review of Systems Anxious, worried    Objective:   Physical Exam @BP  120/80 mmHg  Pulse 93  Ht 5\' 5"  (1.651 m)  Wt 189 lb (85.73 kg)  BMI 31.45 kg/m2@  General:  NAD but anxious Eyes:   anicteric Lungs:  clear Heart:: S1S2 no rubs, murmurs or gallops Abdomen:  soft and mildly tender LQ's, BS+ - quiet, low midline scars Ext:   no edema, cyanosis or clubbing Patti Martin[place], CMA present.  Rectal exam showed anal tag, NL tone, no mass, NL voluntary squeeze and appropriate descent with simulated defecation    Chemistry      Component Value Date/Time   NA 136 09/14/2015 1417   K 3.7 09/14/2015 1417   CL 101 09/14/2015 1417   CO2 23 09/14/2015 1417   BUN 15 09/14/2015 1417   CREATININE 0.58* 09/14/2015 1417   CREATININE 0.48 08/17/2015 1546      Component Value Date/Time   CALCIUM 8.9 09/14/2015 1417   ALKPHOS 32* 09/14/2015 1417   AST 14 09/14/2015 1417   ALT 11 09/14/2015 1417   BILITOT 0.3 09/14/2015 1417      Data Reviewed:  Lab Results  Component Value Date   WBC 10.0 08/17/2015   HGB 11.7* 08/17/2015   HCT 35.4* 08/17/2015   MCV 94.8 08/17/2015   PLT 248.0 08/17/2015        Assessment & Plan:   Encounter Diagnoses  Name Primary?  . Change in bowel habits Yes  . Lower abdominal tenderness   . Bilateral lower abdominal pain    Colonoscopy 2016 - diverticulosis and cecal polyp sxs seemed to start after fenofibrate but  stopped it and persisted Will check CT scan abd/pelvis Use daily MiraLax  10/08/2015 CT showed changes in right colon area - ? Diverticulitis, hematoma in wall and raise ? Of neoplasm. I wonder if not related to prior surgery - right hemicolectomy for perforated cecum. Have started Augmentin and will f/u soon.

## 2015-10-05 NOTE — Telephone Encounter (Signed)
Refills done as requested and both sent in for 90 days to [facility]

## 2015-10-05 NOTE — Patient Instructions (Signed)
  You have been scheduled for a CT scan of the abdomen and pelvis at [facility] ([address]---this is in the same building as [facility]).   You are scheduled on 10/08/15 at 1:00pm. You should arrive 15 minutes prior to your appointment time for registration. Please follow the written instructions below on the day of your exam:  WARNING: IF YOU ARE ALLERGIC TO IODINE/X-RAY DYE, PLEASE NOTIFY RADIOLOGY IMMEDIATELY AT [phone]! YOU WILL BE GIVEN A 13 HOUR PREMEDICATION PREP.  1) Do not eat or drink anything after 9:00AM (4 hours prior to your test) 2) You have been given 2 bottles of oral contrast to drink. The solution may taste   better if refrigerated, but do NOT add ice or any other liquid to this solution. Shake  well before drinking.    Drink 1 bottle of contrast @ 11:00AM (2 hours prior to your exam)  Drink 1 bottle of contrast @ 12:00PM (1 hour prior to your exam)  You may take any medications as prescribed with a small amount of water except for the following: Metformin, Glucophage, Glucovance, Avandamet, Riomet, Fortamet, Actoplus Met, Janumet, Glumetza or Metaglip. The above medications must be held the day of the exam AND 48 hours after the exam.  The purpose of you drinking the oral contrast is to aid in the visualization of your intestinal tract. The contrast solution may cause some diarrhea. Before your exam is started, you will be given a small amount of fluid to drink. Depending on your individual set of symptoms, you may also receive an intravenous injection of x-ray contrast/dye. Plan on being at [facility] for 30 minutes or longer, depending on the type of exam you are having performed.  This test typically takes 30-45 minutes to complete.  If you have any questions regarding your exam or if you need to reschedule, you may call the CT department at [phone] between the hours of 8:00 am and 5:00 pm,  Monday-Friday.  ________________________________________________________________________  Please use miralax daily.    I appreciate the opportunity to care for you. Silvano Rusk, MD, [facility]

## 2015-10-06 MED FILL — ZOLPIDEM TARTRATE 5 MG TAB: 5 | 90 days supply | Qty: 90 | Fill #0

## 2015-10-06 MED FILL — HEMOCYTE PLUS CAPSULE: 106-1 | 30 days supply | Qty: 60 | Fill #2

## 2015-10-08 ENCOUNTER — Telehealth: Payer: Self-pay | Admitting: Internal Medicine

## 2015-10-08 ENCOUNTER — Other Ambulatory Visit: Payer: Self-pay

## 2015-10-08 ENCOUNTER — Ambulatory Visit (INDEPENDENT_AMBULATORY_CARE_PROVIDER_SITE_OTHER)
Admission: RE | Admit: 2015-10-08 | Discharge: 2015-10-08 | Disposition: A | Discharge status: Home / Self Care | Payer: PPO | Source: Ambulatory Visit | Attending: Internal Medicine | Admitting: Internal Medicine

## 2015-10-08 DIAGNOSIS — R1031 Right lower quadrant pain: Secondary | ICD-10-CM

## 2015-10-08 DIAGNOSIS — R10819 Abdominal tenderness, unspecified site: Secondary | ICD-10-CM | POA: Diagnosis not present

## 2015-10-08 DIAGNOSIS — K5900 Constipation, unspecified: Secondary | ICD-10-CM | POA: Diagnosis not present

## 2015-10-08 DIAGNOSIS — R194 Change in bowel habit: Secondary | ICD-10-CM

## 2015-10-08 DIAGNOSIS — R1032 Left lower quadrant pain: Secondary | ICD-10-CM | POA: Diagnosis not present

## 2015-10-08 MED ORDER — IOPAMIDOL (ISOVUE-300) INJECTION 61%
100.0000 mL | Freq: Once | INTRAVENOUS | Status: AC | PRN
  Administered 2015-10-08: 100 mL via INTRAVENOUS

## 2015-10-08 MED ORDER — AMOXICILLIN-POT CLAVULANATE 875-125 MG PO TABS: 1.0000 | ORAL_TABLET | Freq: Two times a day (BID) | ORAL | Status: DC

## 2015-10-08 MED FILL — AMOX-CLAV 875-125 MG TABLET: 875-125 | 10 days supply | Qty: 20 | Fill #0

## 2015-10-08 NOTE — Progress Notes (Signed)
Quick Note:  CT scan suggests diverticulitis though I think these changes could also be a post-op phenomenon - She had a right hemi-colectomy and recent colonoscopy (2016 - perf leading to the colectomy) so I doubt there is a cancer there thouh will need to follow-up  Have her start Augmentin 875 mg bid x 10 days Take w/ food  Also daily MiraLAx  Ask her to call with an update on mon-Tues and call us sooner (on-call) if worse or go to ED Please talk to her re: are her sxs same or worse than the day I saw her - if a lot worse then let me know ______

## 2015-10-08 NOTE — Telephone Encounter (Signed)
Dr. Carlean Purl just got call report on this.  Please look at it asap.  Let me know what to tell her and what you want set up from here.

## 2015-10-09 ENCOUNTER — Encounter: Payer: Self-pay | Admitting: Internal Medicine

## 2015-10-12 ENCOUNTER — Telehealth: Payer: Self-pay | Admitting: Internal Medicine

## 2015-10-12 NOTE — Telephone Encounter (Signed)
I left her a message that I think she needs to schedule a colonoscopy to evaluate change in bowels and abnormal colon on CT  Please call her back and arrange  She can quit the Augmentin if she hasn't

## 2015-10-12 NOTE — Telephone Encounter (Signed)
Patient reports that she is having terrible watery diarrhea since starting Augmentin.  She is convinced that her problem is not diverticulitis.  She feels this absolutely can't be diverticulitis because in the past she has always had diarrhea when she had diverticulitis.  She wants to know what you want to do to find out what this "blockage is that causes her constipation"?  She has been using imodium and dicyclomine since Saturday for the diarrhea.

## 2015-10-13 ENCOUNTER — Other Ambulatory Visit: Payer: Self-pay | Admitting: Internal Medicine

## 2015-10-13 DIAGNOSIS — K5732 Diverticulitis of large intestine without perforation or abscess without bleeding: Secondary | ICD-10-CM

## 2015-10-13 MED ORDER — METRONIDAZOLE 500 MG PO TABS: 500.0000 mg | ORAL_TABLET | Freq: Three times a day (TID) | ORAL | Status: DC

## 2015-10-13 MED ORDER — CEPHALEXIN 500 MG PO TABS: 500.0000 mg | ORAL_TABLET | Freq: Three times a day (TID) | ORAL | Status: DC

## 2015-10-13 MED FILL — metroNIDAZOLE 500 MG TABS: 500 | 10 days supply | Qty: 30 | Fill #0

## 2015-10-13 MED FILL — CEPHALEXIN 500 MG CAPSULE: 500 | 10 days supply | Qty: 30 | Fill #0

## 2015-10-13 NOTE — Telephone Encounter (Signed)
I spoke to patient and daughte New plan: Dc augmentin Keflex + metronidazole (I rxed0 See me 7/26 in late AM

## 2015-10-13 NOTE — Telephone Encounter (Signed)
Patient notified She will come for an appt on 10/20/15 11:15

## 2015-10-13 NOTE — Telephone Encounter (Signed)
Pt's daughter Baldo Ash called and would like Dr. Carlean Purl to call her back regarding her mother [phone]

## 2015-10-13 NOTE — Telephone Encounter (Signed)
I spoke with the patient and she won't schedule at this time.  She wants to talk to you before she will schedule.  Please call her , I did let her know that would be later possibly this evening as you are in procedures.

## 2015-10-18 ENCOUNTER — Ambulatory Visit: Payer: PPO | Admitting: Internal Medicine

## 2015-10-20 ENCOUNTER — Ambulatory Visit (INDEPENDENT_AMBULATORY_CARE_PROVIDER_SITE_OTHER): Payer: PPO | Admitting: Internal Medicine

## 2015-10-20 ENCOUNTER — Encounter: Payer: Self-pay | Admitting: Internal Medicine

## 2015-10-20 ENCOUNTER — Ambulatory Visit (HOSPITAL_COMMUNITY)
Admission: RE | Admit: 2015-10-20 | Discharge: 2015-10-20 | Disposition: A | Discharge status: Home / Self Care | Payer: PPO | Source: Ambulatory Visit | Attending: Internal Medicine | Admitting: Internal Medicine

## 2015-10-20 VITALS — BP 120/64 | Ht 65.0 in | Wt 185.0 lb

## 2015-10-20 DIAGNOSIS — I82591 Chronic embolism and thrombosis of other specified deep vein of right lower extremity: Secondary | ICD-10-CM | POA: Insufficient documentation

## 2015-10-20 DIAGNOSIS — M79661 Pain in right lower leg: Secondary | ICD-10-CM | POA: Insufficient documentation

## 2015-10-20 DIAGNOSIS — R600 Localized edema: Secondary | ICD-10-CM | POA: Insufficient documentation

## 2015-10-20 DIAGNOSIS — I82511 Chronic embolism and thrombosis of right femoral vein: Secondary | ICD-10-CM | POA: Insufficient documentation

## 2015-10-20 DIAGNOSIS — R933 Abnormal findings on diagnostic imaging of other parts of digestive tract: Secondary | ICD-10-CM

## 2015-10-20 DIAGNOSIS — I82531 Chronic embolism and thrombosis of right popliteal vein: Secondary | ICD-10-CM | POA: Insufficient documentation

## 2015-10-20 NOTE — Progress Notes (Signed)
Subjective:    Patient ID: Alexis Olson, female    DOB: [birth year], 82 y.o.   MRN: [id number] Chief complaint: Follow-up of abnormal CT, colon and change in bowel habits HPI  Since last seen the patient has had a CT scan of the abdomen and pelvis that demonstrated changes in the right colon, and descending area that suggested different possible problems that include possible diverticulitis, hematoma in the bowel wall and possibly a neoplasm which the radiologist thought he favored. I discussed the images and reviewed the images with the radiologist and the differential diagnosis remains. The patient was treated with Augmentin for possible diverticulitis which caused terrible diarrhea and made her feel poorly and she said her leg right lower extremity swelled and was painful in the popliteal fossa and ankle area and though it is improved she still has persistent edema in the right foot ankle and lower leg that woke oh down despite elevation ice. The pain is much less if not gone. She still moving very slowly. I switched her to Flex and metronidazole and she says her bowel habits are now back to where they are with loose stools using Lomotil to help control. She also takes dicyclomine. He does not have any more abdominal pain or tenderness. She has not been constipated. Overall she feels better but the lower extremity problem and joint pains remain though diminished from where they were at their worst. She does not describe fever. Medications, allergies, past medical history, past surgical history, family history and social history are reviewed and updated in the EMR.  Review of Systems As per history of present illness    Objective:   Physical Exam BP 120/64   Ht 5\' 5"  (1.651 m)   Wt 185 lb (83.9 kg)   BMI 30.79 kg/m  NAD Abdomen is soft and nontender BS+ RLE 1+ foot and ankle/lower pre-tibial edema with some mild warmth. + varicosities, non-tender calf, mildly tender popkliteal  fossa/ankle LLE trace edemea foor/ankle + varicosities and not warm Alert and oriented x 3 Moving slow when walking, rising from chair   Data reviewed as above in history of present illness    Assessment & Plan:   1. Abnormal CT scan, colon   2. Edema of right lower extremity   3. Pain in right lower leg     Never this process is in her colon seems to be improved. I do raise question of whether or not it's postoperative changes after her cecal oversew though it's not quite in the right area for that exact surgery. Seems very unlikely she have a neoplasm there with recent colonoscopy though it certainly possible. At any rate she is improved clinically in this area but is having right lower extremity greater than left lower extremity swelling and had symptoms in the right lower extremity that make me wonder if she could have a DVT. There is a history of venous insufficiency there and has had venous ulcers before, and I think there is potentially a stasis issue and she is at higher risk of DVT so we'll get a Doppler ultrasound of the right lower extremity to exclude.  She will finish her antibiotics and have a CT scan in early September to follow-up this area of the colon. If there are persistent problems will need to consider a colonoscopy though both of [place] would like to avoid that if possible. She brought a note from her daughter who is an emergency room nurse raising the question of a  virtual colonoscopy, that's a possibility though it is not designed for this specific issue, and though we could do that I would favor a standard CT scan of the abdomen and pelvis with contrast first and hold that and reserve if necessary. I did inform the patient that it would not be covered under Medicare as it is not a covered screening test and in her situation she needs diagnostic studies anyway.  I appreciate the opportunity to care for this patient. CC: Penni Homans, MD Dr. Lorine Bears

## 2015-10-20 NOTE — Patient Instructions (Addendum)
  Today we made you an appointment at [facility] vascular lab to have a doppler done, please go to the [place] tower entrance at [facility], your appointment is at 3:00pm.   You have been scheduled for a CT scan of the abdomen and pelvis at [facility] ([address]---this is in the same building as [employer]).   You are scheduled on 12/01/15, 2:00pm. You should arrive 15 minutes prior to your appointment time for registration. Please follow the written instructions below on the day of your exam:  WARNING: IF YOU ARE ALLERGIC TO IODINE/X-RAY DYE, PLEASE NOTIFY RADIOLOGY IMMEDIATELY AT [phone]! YOU WILL BE GIVEN A 13 HOUR PREMEDICATION PREP.  1) Do not eat or drink anything after 10:00AM (4 hours prior to your test) 2) You have been given 2 bottles of oral contrast to drink. The solution may taste  better if refrigerated, but do NOT add ice or any other liquid to this solution. Shake  well before drinking.    Drink 1 bottle of contrast @ 12:00noon (2 hours prior to your exam)  Drink 1 bottle of contrast @ 1:00PM (1 hour prior to your exam)  You may take any medications as prescribed with a small amount of water except for the following: Metformin, Glucophage, Glucovance, Avandamet, Riomet, Fortamet, Actoplus Met, Janumet, Glumetza or Metaglip. The above medications must be held the day of the exam AND 48 hours after the exam.  The purpose of you drinking the oral contrast is to aid in the visualization of your intestinal tract. The contrast solution may cause some diarrhea. Before your exam is started, you will be given a small amount of fluid to drink. Depending on your individual set of symptoms, you may also receive an intravenous injection of x-ray contrast/dye. Plan on being at [facility] for 30 minutes or longer, depending on the type of exam you are having performed.  This test typically takes 30-45 minutes to complete.  If you have any questions regarding  your exam or if you need to reschedule, you may call the CT department at [phone] between the hours of 8:00 am and 5:00 pm, Monday-Friday.  ________________________________________________________________________  Please come a week before your CT and have labs drawn (BMET) downstairs in our lab. No appointment needed, they are open 7:30AM-5:30PM, and close for lunch from 1:30-2:30pm.      I appreciate the opportunity to care for you. Silvano Rusk, MD, [facility]

## 2015-10-20 NOTE — Progress Notes (Signed)
*  PRELIMINARY RESULTS* Vascular Ultrasound Right lower extremity venous duplex has been completed.  Preliminary findings: Negative for Acute DVT. 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   Called results to Dr. Carlean Purl.     Landry Mellow, RDMS, RVT  10/20/2015, 3:35 PM

## 2015-10-21 NOTE — Progress Notes (Signed)
See prior note please

## 2015-10-21 NOTE — Progress Notes (Signed)
Patient was informed of results at vascular lab I suggest she f/u PCP re: LE edema if it fails to resolve and it could make sense to see Dr. Scot Dock of vein and Vascular again Will notify her after she does abd pelvic CT in Sept

## 2015-10-26 ENCOUNTER — Other Ambulatory Visit: Payer: Self-pay | Admitting: Family Medicine

## 2015-10-26 MED FILL — KLOR-CON M20 TABLET: 20 | 30 days supply | Qty: 30 | Fill #0

## 2015-11-04 MED FILL — LEVOCETIRIZINE 5 MG TABLET: 5 | 90 days supply | Qty: 90 | Fill #0

## 2015-11-24 MED FILL — KLOR-CON M20 TABLET: 20 | 30 days supply | Qty: 30 | Fill #1

## 2015-11-30 ENCOUNTER — Other Ambulatory Visit (INDEPENDENT_AMBULATORY_CARE_PROVIDER_SITE_OTHER): Payer: PPO

## 2015-11-30 DIAGNOSIS — R600 Localized edema: Secondary | ICD-10-CM | POA: Diagnosis not present

## 2015-11-30 DIAGNOSIS — R933 Abnormal findings on diagnostic imaging of other parts of digestive tract: Secondary | ICD-10-CM | POA: Diagnosis not present

## 2015-11-30 DIAGNOSIS — M79661 Pain in right lower leg: Secondary | ICD-10-CM

## 2015-11-30 LAB — BASIC METABOLIC PANEL
BUN: 15 mg/dL (ref 6–23)
CO2: 29 mEq/L (ref 19–32)
Calcium: 8.9 mg/dL (ref 8.4–10.5)
Chloride: 99 mEq/L (ref 96–112)
Creatinine, Ser: 0.69 mg/dL (ref 0.40–1.20)
GFR: 86.88 mL/min (ref >60.00)
Glucose, Bld: 94 mg/dL (ref 70–99)
Potassium: 3.9 mEq/L (ref 3.5–5.1)
Sodium: 132 mEq/L — ABNORMAL LOW (ref 135–145)

## 2015-12-01 ENCOUNTER — Ambulatory Visit (INDEPENDENT_AMBULATORY_CARE_PROVIDER_SITE_OTHER)
Admission: RE | Admit: 2015-12-01 | Discharge: 2015-12-01 | Disposition: A | Discharge status: Home / Self Care | Payer: PPO | Source: Ambulatory Visit | Attending: Internal Medicine | Admitting: Internal Medicine

## 2015-12-01 DIAGNOSIS — R933 Abnormal findings on diagnostic imaging of other parts of digestive tract: Secondary | ICD-10-CM | POA: Diagnosis not present

## 2015-12-01 DIAGNOSIS — R197 Diarrhea, unspecified: Secondary | ICD-10-CM | POA: Diagnosis not present

## 2015-12-01 MED ORDER — IOPAMIDOL (ISOVUE-300) INJECTION 61%
100.0000 mL | Freq: Once | INTRAVENOUS | Status: AC | PRN
  Administered 2015-12-01: 100 mL via INTRAVENOUS

## 2015-12-01 NOTE — Progress Notes (Signed)
Colon looks ok How are her GI sxs ? - diarrhea/pain

## 2015-12-02 ENCOUNTER — Telehealth: Payer: Self-pay | Admitting: Internal Medicine

## 2015-12-02 NOTE — Progress Notes (Signed)
So over time has tended towards diarrhea which is not having now - so different bowel habits than usual  I am not alarmed by current situation I suggest she give it some time and f/u prn If she desires she can schedule a next available appt so we can review things further

## 2015-12-02 NOTE — Telephone Encounter (Signed)
See CT results for additional details 

## 2015-12-06 MED FILL — HEMOCYTE PLUS CAPSULE: 106-1 | 30 days supply | Qty: 60 | Fill #3

## 2015-12-08 ENCOUNTER — Telehealth: Payer: Self-pay | Admitting: Internal Medicine

## 2015-12-08 ENCOUNTER — Other Ambulatory Visit: Payer: Self-pay | Admitting: Internal Medicine

## 2015-12-08 DIAGNOSIS — R194 Change in bowel habit: Secondary | ICD-10-CM

## 2015-12-08 MED ORDER — POLYETHYLENE GLYCOL 3350 17 GM/SCOOP PO POWD: 17.0000 g | Freq: Every day | ORAL | 0 refills | Status: DC

## 2015-12-08 NOTE — Progress Notes (Signed)
I spoke to her. I told her based upon all the available information it does not seem to be a structural problem causing her change in bowel habits. I did tell her we could proceed with a colonoscopy as I had recommended earlier but given the perforation she suffered due to a polypectomy last year she is unwilling to pursue a colonoscopy. R graft she had to take some laxatives over the weekend due to constipation and that worked. I have recommended she try MiraLAX on a daily basis 1 or 2 doses each day.  Her change in bowels did seem to start around the time she started fenofibrate. She says she has held that at times without difference. When she follows up with Dr. Charlett Blake she can talk about a possible different therapy replaced that. 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 She has not had her lipids rechecked, as she has not yet followed up with Dr. Charlett Blake.

## 2015-12-08 NOTE — Telephone Encounter (Signed)
I will call her.

## 2015-12-21 ENCOUNTER — Ambulatory Visit (INDEPENDENT_AMBULATORY_CARE_PROVIDER_SITE_OTHER): Payer: PPO

## 2015-12-21 DIAGNOSIS — Z23 Encounter for immunization: Secondary | ICD-10-CM

## 2015-12-22 ENCOUNTER — Other Ambulatory Visit: Payer: Self-pay | Admitting: Internal Medicine

## 2015-12-27 ENCOUNTER — Other Ambulatory Visit: Payer: Self-pay | Admitting: Family Medicine

## 2015-12-27 MED FILL — CYANOCOBALAMIN 1,000 MCG/ML: 1000 | 90 days supply | Qty: 3 | Fill #1

## 2015-12-28 MED FILL — ALPRAZolam 1 MG TABS: 1 | 90 days supply | Qty: 90 | Fill #1

## 2015-12-28 MED FILL — FLUTICASONE PROP 50 MCG SPR: 50 | 30 days supply | Qty: 16 | Fill #1

## 2015-12-28 MED FILL — GABAPENTIN 300 MG CAPSULE: 300 | 90 days supply | Qty: 90 | Fill #0

## 2015-12-30 MED FILL — MONTELUKAST SOD 10 MG TAB: 10 | 90 days supply | Qty: 90 | Fill #2

## 2015-12-31 MED FILL — KLOR-CON M20 TABLET: 20 | 30 days supply | Qty: 30 | Fill #0

## 2016-01-03 MED FILL — OMEPRAZOLE 20 MG CAPSULE DR: 20 | 90 days supply | Qty: 90 | Fill #2

## 2016-01-04 MED FILL — AMITRIPTYLINE HCL 100 MG TA: 100 | 90 days supply | Qty: 90 | Fill #0

## 2016-01-06 ENCOUNTER — Telehealth: Payer: Self-pay | Admitting: Family Medicine

## 2016-01-06 NOTE — Telephone Encounter (Signed)
Last refill for this medication was on 10/05/2015 for #90.  Looks like it may be early?

## 2016-01-06 NOTE — Telephone Encounter (Signed)
Patient called to let us know that a 30 day rx for zolpidem (AMBIEN) 5 MG tablet was called into the pharmacy, but she wanted a 90 day script instead. She stated that they only get paid once a month and it is more cost effective for her to get a 90 day prescription. Please advise  Patient phone: [phone]  Pharmacy: [place], [place]

## 2016-01-06 NOTE — Telephone Encounter (Signed)
OK to refill Ambien with same sig, #90 no refills

## 2016-01-07 MED ORDER — ZOLPIDEM TARTRATE 5 MG PO TABS: 5.0000 mg | ORAL_TABLET | Freq: Every day | ORAL | 0 refills | Status: DC

## 2016-01-07 MED FILL — ZOLPIDEM TARTRATE 5 MG TAB: 5 | 30 days supply | Qty: 90 | Fill #0

## 2016-01-07 NOTE — Telephone Encounter (Signed)
[place] and phoned in the refill for Zolpidem #90 with 0 refills. Then called the patient informed refill done as requested.

## 2016-01-15 ENCOUNTER — Ambulatory Visit (HOSPITAL_COMMUNITY)
Admission: EM | Admit: 2016-01-15 | Discharge: 2016-01-15 | Disposition: A | Discharge status: Home / Self Care | Payer: PPO | Attending: Family Medicine | Admitting: Family Medicine

## 2016-01-15 ENCOUNTER — Encounter (HOSPITAL_COMMUNITY): Payer: Self-pay | Admitting: Family Medicine

## 2016-01-15 DIAGNOSIS — Z79899 Other long term (current) drug therapy: Secondary | ICD-10-CM | POA: Diagnosis not present

## 2016-01-15 DIAGNOSIS — N3090 Cystitis, unspecified without hematuria: Secondary | ICD-10-CM

## 2016-01-15 DIAGNOSIS — N390 Urinary tract infection, site not specified: Secondary | ICD-10-CM | POA: Insufficient documentation

## 2016-01-15 DIAGNOSIS — Z7982 Long term (current) use of aspirin: Secondary | ICD-10-CM | POA: Insufficient documentation

## 2016-01-15 LAB — POCT URINALYSIS DIP (DEVICE)
Bilirubin Urine: NEGATIVE
Glucose, UA: NEGATIVE mg/dL
Ketones, ur: NEGATIVE mg/dL
Nitrite: NEGATIVE
Protein, ur: 100 mg/dL — AB
Specific Gravity, Urine: 1.01 (ref 1.005–1.030)
Urobilinogen, UA: 0.2 mg/dL (ref 0.0–1.0)
pH: 7 (ref 5.0–8.0)

## 2016-01-15 MED ORDER — CEPHALEXIN 500 MG PO CAPS: 500.0000 mg | ORAL_CAPSULE | Freq: Four times a day (QID) | ORAL | 0 refills | Status: DC

## 2016-01-15 NOTE — ED Provider Notes (Signed)
[place]    CSN: [id number] Arrival date & time: 01/15/16  1338     History   Chief Complaint Chief Complaint  Patient presents with  . Urinary Tract Infection    HPI Alexis Olson is a 82 y.o. female.   The history is provided by the patient.  Urinary Tract Infection  Pain quality:  Burning Pain severity:  Moderate Onset quality:  Sudden Duration:  1 day Progression:  Worsening Chronicity:  Chronic Recent urinary tract infections: yes   Relieved by:  None tried Worsened by:  Nothing Ineffective treatments:  None tried Urinary symptoms: frequent urination   Associated symptoms: no abdominal pain, no fever, no flank pain, no nausea and no vomiting   Risk factors: recurrent urinary tract infections   Risk factors: not pregnant and not sexually active     Past Medical History:  Diagnosis Date  . ALLERGIC RHINITIS   . ANEMIA, CHRONIC    Malabsorption related to bypass hx, B12 and iron deficiency  . ANXIETY   . ASTHMA   . Bariatric surgery status   . Colon perforation ([place]) 08/2014   following polypetomy during colo, surgical repair  . COLONIC POLYPS, ADENOMATOUS, HX OF 2010  . DIVERTICULOSIS, COLON   . GERD (gastroesophageal reflux disease) 08/23/2015  . HSV (herpes simplex virus) infection 08/16/2015  . Hypertension   . INSOMNIA   . Lumbosacral disc disease    Chronic pain; History of osteomyelitis 2010 following ESI complication  . Obesity   . Recurrent UTI 08/23/2015  . URINARY URGENCY   . VITAMIN B12 DEFICIENCY     Patient Active Problem List   Diagnosis Date Noted  . Recurrent UTI 08/23/2015  . GERD (gastroesophageal reflux disease) 08/23/2015  . HSV (herpes simplex virus) infection 08/16/2015  . Hyperlipidemia, mild 08/16/2015  . Osteopenia, senile   . Chronic diarrhea   . Anemia   . OSTEOMYELITIS, VERTEBRA 05/05/2009  . [employer] DISEASE, LUMBOSACRAL SPINE 04/22/2009  . Vitamin B 12 deficiency 07/09/2008  . DIVERTICULOSIS, COLON  07/09/2008  . COLONIC POLYPS, ADENOMATOUS, HX OF 07/09/2008  . Anxiety state 08/08/2007  . INSOMNIA 08/08/2007  . Bariatric surgery status 08/08/2007  . ALLERGIC RHINITIS 09/18/2006    Past Surgical History:  Procedure Laterality Date  . [place]   for ruptured disc  . CHOLECYSTECTOMY    . ileojejunal bypass  1976   for obesity  . LAPAROTOMY N/A 09/18/2014   Procedure: EXPLORATORY LAPAROTOMY WITH REPAIR OF CECIAL PERFORATION;  Surgeon: Autumn Messing III, MD;  Location: [facility];  Service: General;  Laterality: N/A;  . TONSILLECTOMY      OB History    No data available       Home Medications    Prior to Admission medications   Medication Sig Start Date End Date Taking? Authorizing Provider  albuterol (PROVENTIL HFA;VENTOLIN HFA) 108 (90 BASE) MCG/ACT inhaler Inhale 1-2 puffs into the lungs every 6 (six) hours as needed for wheezing or shortness of breath.    Historical Provider, MD  ALPRAZolam Duanne Moron) 1 MG tablet Take 0.5-1 tablets (0.5-1 mg total) by mouth at bedtime as needed. 09/09/15   Mosie Lukes, MD  amitriptyline (ELAVIL) 100 MG tablet Take 1 tablet by mouth daily 10/05/15   Mosie Lukes, MD  azelastine (OPTIVAR) 0.05 % ophthalmic solution Place 1 drop into both eyes daily as needed (dry eyes).     Historical Provider, MD  cephALEXin (KEFLEX) 250 MG capsule Take by mouth  daily. 12/08/15   Gatha Mayer, MD  cetirizine (ZYRTEC) 10 MG tablet Take 10 mg by mouth daily.  02/05/14   Historical Provider, MD  cholecalciferol (VITAMIN D) 1000 UNITS tablet Take 1,000 Units by mouth daily.    Historical Provider, MD  cyanocobalamin (,VITAMIN B-12,) 1000 MCG/ML injection INJECT 1 ML (1,000 MCG TOTAL) INTO THE MUSCLE EVERY 30 (THIRTY) DAYS. 09/15/15   Gatha Mayer, MD  dicyclomine (BENTYL) 10 MG capsule Take 1 capsule (10 mg total) by mouth 3 (three) times daily. 05/19/15   Gatha Mayer, MD  diphenoxylate-atropine (LOMOTIL) 2.5-0.025 MG tablet Take 2 tablets by mouth 3 (three)  times daily as needed for diarrhea or loose stools. 05/19/15   Gatha Mayer, MD  Fe Fum-FA-B Cmp-C-Zn-Mg-Mn-Cu (HEMOCYTE PLUS) 106-1 MG CAPS Take 1 capsule by mouth 2 (two) times daily. 05/19/15   Gatha Mayer, MD  fenofibrate micronized (ANTARA) 130 MG capsule TAKE 1 CAPSULE BY MOUTH DAILY. 09/21/15   Mosie Lukes, MD  fluticasone (FLONASE) 50 MCG/ACT nasal spray 2 sprays by Nasal route daily.      Historical Provider, MD  gabapentin (NEURONTIN) 300 MG capsule TAKE 1 CAPSULE BY MOUTH AT BEDTIME. 12/22/15   Mosie Lukes, MD  hydrOXYzine (ATARAX) 25 MG tablet Take 25 mg by mouth 3 (three) times daily as needed for itching.     Historical Provider, MD  KLOR-CON M20 20 MEQ tablet TAKE 1 TABLET BY MOUTH ONCE DAILY 12/27/15   Mosie Lukes, MD  levocetirizine (XYZAL) 5 MG tablet Take 1 tablet (5 mg total) by mouth every evening. 08/04/14   Rowe Clack, MD  magnesium oxide (MAG-OX 400) 400 MG tablet Take 400 mg by mouth 2 (two) times daily.     Historical Provider, MD  montelukast (SINGULAIR) 10 MG tablet Take 10 mg by mouth at bedtime.  02/11/14   Historical Provider, MD  omeprazole (PRILOSEC) 20 MG capsule Take 1 capsule (20 mg total) by mouth daily. 07/02/15   Gatha Mayer, MD  polyethylene glycol powder [facility]) powder Take 17 g by mouth daily. 12/08/15   Gatha Mayer, MD  PX ENTERIC ASPIRIN 81 MG EC tablet Take 81 mg by mouth daily.  02/05/14   Historical Provider, MD  valACYclovir (VALTREX) 500 MG tablet Take 500 mg by mouth as needed.      Historical Provider, MD  vitamin E 400 UNIT capsule Take 400 Units by mouth daily.      Historical Provider, MD  zolpidem (AMBIEN) 5 MG tablet Take 1 tablet (5 mg total) by mouth at bedtime. 01/07/16   Mosie Lukes, MD    Family History Family History  Problem Relation Age of Onset  . Cervical cancer Mother   . Stroke Mother   . Liver disease Sister   . Kidney disease Sister   . Cirrhosis Sister   . Colon cancer Neg Hx   . Esophageal  cancer Neg Hx   . Rectal cancer Neg Hx   . Stomach cancer Neg Hx     Social History Social History  Substance Use Topics  . Smoking status: Never Smoker  . Smokeless tobacco: Never Used  . Alcohol use No     Allergies   Other; Azithromycin; Benadryl [diphenhydramine hcl]; Ciprofloxacin hcl; Codeine phosphate; Hydrocodone; Hydrocodone-homatropine; Levaquin [levofloxacin in d5w]; Meperidine hcl; Sulfa antibiotics; and Ultram [tramadol hcl]   Review of Systems Review of Systems  Constitutional: Negative.  Negative for fever.  Gastrointestinal: Negative for  abdominal pain, nausea and vomiting.  Genitourinary: Positive for dysuria, frequency and urgency. Negative for flank pain, menstrual problem and pelvic pain.  All other systems reviewed and are negative.    Physical Exam Triage Vital Signs ED Triage Vitals [01/15/16 1346]  Enc Vitals Group     BP 140/83     Pulse Rate 96     Resp 18     Temp 97.8 F (36.6 C)     Temp src      SpO2 100 %     Weight      Height      Head Circumference      Peak Flow      Pain Score      Pain Loc      Pain Edu?      Excl. in [place]?    No data found.   Updated Vital Signs BP 140/83   Pulse 96   Temp 97.8 F (36.6 C)   Resp 18   SpO2 100%   Visual Acuity Right Eye Distance:   Left Eye Distance:   Bilateral Distance:    Right Eye Near:   Left Eye Near:    Bilateral Near:     Physical Exam  Constitutional: She is oriented to person, place, and time. She appears well-developed and well-nourished.  Abdominal: Soft. Bowel sounds are normal. She exhibits no mass. There is no tenderness. There is no guarding.  Neurological: She is alert and oriented to person, place, and time.  Skin: Skin is warm and dry.  Nursing note and vitals reviewed.    UC Treatments / Results  Labs (all labs ordered are listed, but only abnormal results are displayed) Labs Reviewed  POCT URINALYSIS DIP (DEVICE) - Abnormal; Notable for the  following:       Result Value   Hgb urine dipstick LARGE (*)    Protein, ur 100 (*)    Leukocytes, UA LARGE (*)    All other components within normal limits    EKG  EKG Interpretation None       Radiology No results found.  Procedures Procedures (including critical care time)  Medications Ordered in UC Medications - No data to display   Initial Impression / Assessment and Plan / UC Course  I have reviewed the triage vital signs and the nursing notes.  Pertinent labs & imaging results that were available during my care of the patient were reviewed by me and considered in my medical decision making (see chart for details).  Clinical Course      Final Clinical Impressions(s) / UC Diagnoses   Final diagnoses:  None    New Prescriptions New Prescriptions   No medications on file     Billy Fischer, MD 01/15/16 [phone]

## 2016-01-15 NOTE — Discharge Instructions (Signed)
Take all of medicine as directed, drink lots of fluids, see your doctor if further problems. °

## 2016-01-15 NOTE — ED Triage Notes (Signed)
Pt here for UTI symptoms

## 2016-01-16 LAB — URINE CULTURE: Culture: <10000 — AB

## 2016-01-17 ENCOUNTER — Telehealth (HOSPITAL_COMMUNITY): Payer: Self-pay | Admitting: Emergency Medicine

## 2016-01-17 NOTE — Telephone Encounter (Signed)
Called pt and notified of recent lab results Pt ID'd properly... Reports feeling a little bit better.  States she has a f/u w/PCP first week of November.  Adv pt if sx are not getting better to return or to f/u w/PCP Pt verb understanding.

## 2016-01-17 NOTE — Telephone Encounter (Signed)
-----   Message from Sherlene Shams, MD sent at 01/16/2016  4:52 PM EDT ----- Clinical staff, please let patient know that urine culture did not clearly demonstrate a UTI.   Recheck or followup with primary care provider Penni Homans for further evaluation if urinary symptoms persist.  LM

## 2016-01-26 MED FILL — KLOR-CON M20 TABLET: 20 | 30 days supply | Qty: 30 | Fill #1

## 2016-01-26 MED FILL — LEVOCETIRIZINE 5 MG TABLET: 5 | 90 days supply | Qty: 90 | Fill #1

## 2016-01-28 MED FILL — CENTRATEX CAPSULE: 106-1 | 30 days supply | Qty: 60 | Fill #4

## 2016-02-07 ENCOUNTER — Ambulatory Visit (INDEPENDENT_AMBULATORY_CARE_PROVIDER_SITE_OTHER): Payer: PPO | Admitting: Internal Medicine

## 2016-02-07 ENCOUNTER — Other Ambulatory Visit (INDEPENDENT_AMBULATORY_CARE_PROVIDER_SITE_OTHER): Payer: PPO

## 2016-02-07 ENCOUNTER — Encounter: Payer: Self-pay | Admitting: Internal Medicine

## 2016-02-07 VITALS — BP 102/78 | HR 100 | Ht 65.0 in | Wt 183.4 lb

## 2016-02-07 DIAGNOSIS — F419 Anxiety disorder, unspecified: Secondary | ICD-10-CM | POA: Diagnosis not present

## 2016-02-07 DIAGNOSIS — D649 Anemia, unspecified: Secondary | ICD-10-CM

## 2016-02-07 DIAGNOSIS — R933 Abnormal findings on diagnostic imaging of other parts of digestive tract: Secondary | ICD-10-CM

## 2016-02-07 DIAGNOSIS — K582 Mixed irritable bowel syndrome: Secondary | ICD-10-CM

## 2016-02-07 DIAGNOSIS — N3020 Other chronic cystitis without hematuria: Secondary | ICD-10-CM | POA: Diagnosis not present

## 2016-02-07 LAB — CBC WITH DIFFERENTIAL/PLATELET
Basophils Absolute: 0 10*3/uL (ref 0.0–0.1)
Basophils Relative: 0.4 % (ref 0.0–3.0)
Eosinophils Absolute: 0.3 10*3/uL (ref 0.0–0.7)
Eosinophils Relative: 3.4 % (ref 0.0–5.0)
HCT: 37.2 % (ref 36.0–46.0)
Hemoglobin: 12.5 g/dL (ref 12.0–15.0)
Lymphocytes Relative: 27.8 % (ref 12.0–46.0)
Lymphs Abs: 2.5 10*3/uL (ref 0.7–4.0)
MCHC: 33.6 g/dL (ref 30.0–36.0)
MCV: 92.7 fl (ref 78.0–100.0)
Monocytes Absolute: 0.7 10*3/uL (ref 0.1–1.0)
Monocytes Relative: 7.7 % (ref 3.0–12.0)
Neutro Abs: 5.4 10*3/uL (ref 1.4–7.7)
Neutrophils Relative %: 60.7 % (ref 43.0–77.0)
Platelets: 317 10*3/uL (ref 150.0–400.0)
RBC: 4.01 Mil/uL (ref 3.87–5.11)
RDW: 13.8 % (ref 11.5–15.5)
WBC: 8.8 10*3/uL (ref 4.0–10.5)

## 2016-02-07 LAB — FERRITIN: Ferritin: 570.4 ng/mL — ABNORMAL HIGH (ref 10.0–291.0)

## 2016-02-07 MED FILL — CEPHALEXIN 500 MG CAPSULE: 500 | 10 days supply | Qty: 20 | Fill #0

## 2016-02-07 NOTE — Progress Notes (Signed)
   Alexis Olson 82 y.o. September 24, 1933 [id number]  Assessment & Plan:   1. Irritable bowel syndrome with both constipation and diarrhea   2. Abnormal CT scan, colon   3. Mild chronic anemia   4. Anxiety     She is better overall and more her baseline. She continues to worry about things and admits this. I reassured her as best I could. She had a repeat CT scan without any findings of a mass lesion in her colon, we reviewed her ventral hernia. I will check her ferritin and CBC to follow-up on her mild chronic anemia. She will see me as needed most likely pending results of above. She is not interested in pursuing a colonoscopy and I think that's fine.   Subjective:   Chief Complaint: Abnormal colon on CT scan in the past, here for follow-up  HPI The patient reports that she is having more of her typical diarrhea-like stools and though that can be a problem she is relieved that it's back to her more normal situation. 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 We discussed again the CT scan that she had a repeated things there was no mass lesion as suggested in the previous CT scan. She still does not want to pursue a colonoscopy.  Medications, allergies, past medical history, past surgical history, family history and social history are reviewed and updated in the EMR.   Review of Systems As above.  Objective:   Physical Exam BP 102/78   Pulse 100   Ht 5\' 5"  (1.651 m)   Wt 183 lb 6 oz (83.2 kg)   BMI 30.52 kg/m  No acute distress  15 minutes time spent with patient > half in counseling coordination of care

## 2016-02-07 NOTE — Patient Instructions (Signed)
  Your physician has requested that you go to the basement for the following lab work before leaving today: CBC/diff, Ferritin    Follow up with Dr Carlean Purl as needed.      I appreciate the opportunity to care for you. Silvano Rusk, MD, [facility]

## 2016-02-08 NOTE — Progress Notes (Signed)
Normal CBC and iron levels - please inform

## 2016-02-09 MED FILL — CEPHALEXIN 250 MG CAPSULE: 250 | 90 days supply | Qty: 90 | Fill #0

## 2016-02-14 ENCOUNTER — Telehealth: Payer: Self-pay | Admitting: Family Medicine

## 2016-02-14 NOTE — Telephone Encounter (Signed)
Caller name: Athalie Relationship to patient: self Can be reached: [phone]  Reason for call: Pt states she got a bill for $30 from [facility] from DOS 12/21/15 when she received her flu shot. They have never paid anything so she called her insurance rep and was told the admin fee and vaccination should have been covered at 100%. Pt would like a call back.

## 2016-02-16 ENCOUNTER — Telehealth: Payer: Self-pay | Admitting: Family Medicine

## 2016-02-16 NOTE — Telephone Encounter (Signed)
Patient call back and I informed her that in her PCPs absence, I had discussed her request with the covering provider, and due to the fact that the last time the requested Rx was px was by her previous PCP [Dr. Asa Lente in 2016] and that she has not been seen for or had this medication px by Dr. Charlett Blake for reason given [mouth sores]. When asked if this issue was caused by a GI problem, patient stated that, "No, I bite my jaws sometimes"; patient was informed that this information would be forwarded to her PCP, but that we do not usually px for this reason. Gave patient instructions that she could use OTC Mylanta and liquid Benadryl 50:50 mixture [best chilled] to swish, gargle & spit to help her with oral soreness at this time, pt understood & agreed/SLS 11/22

## 2016-02-16 NOTE — Telephone Encounter (Signed)
LMOM with contact name and number for return call RE:  Requested medication per provider instructions/SLS 11/22

## 2016-02-16 NOTE — Telephone Encounter (Signed)
I don't routinely rx this for oral sores. She is welcome to wait for Dr Frederik Pear recommendations or see her GI dr. Sena Hitch.

## 2016-02-16 NOTE — Telephone Encounter (Signed)
While calling patient regarding medication request, I was informed about this billing issue and ask about a response; pt was informed that the message is with our [place], and will be forwarded to PCP as well, at patient request/SLS 11/22

## 2016-02-16 NOTE — Telephone Encounter (Signed)
Nystatin 100000 unit/mL suspension last px by previous PCP [Dr. Leschber] on 04/21/14, completed on 02/22/15, last OV 08/16/15 with Dr. Charlett Blake. Please Advise on refills/SLS 11/22

## 2016-02-16 NOTE — Telephone Encounter (Signed)
Patient is calling to requesting a refill of Nystatin 90ml She states that she sometimes gets sores in her mouth and this medication helps. Please advise  Pharmacy: [facility], [place]

## 2016-02-21 MED ORDER — FIRST-DUKES MOUTHWASH MT SUSP: OROMUCOSAL | 2 refills | Status: DC

## 2016-02-21 MED FILL — MAGIC MOUTHWASH BOP FORM: 2 days supply | Qty: 120 | Fill #0

## 2016-02-21 NOTE — Telephone Encounter (Signed)
Can have a refill on the Nystatin per last rx if no improvement then needs to come in

## 2016-02-21 NOTE — Telephone Encounter (Signed)
See previous note

## 2016-02-21 NOTE — Telephone Encounter (Signed)
Sent in nystatin as PCP instructed. Patient informed refill done/instructions.

## 2016-02-21 NOTE — Telephone Encounter (Signed)
Dawn, is there any reason from a coding perspective that this wasn't covered 100%?

## 2016-02-21 NOTE — Telephone Encounter (Signed)
If she is still having mouth sores, she can try Nystatin can refill last rx given for this, without further refills will need to be seen if does not resolve

## 2016-02-24 ENCOUNTER — Other Ambulatory Visit: Payer: Self-pay | Admitting: Family Medicine

## 2016-02-24 ENCOUNTER — Telehealth: Payer: Self-pay | Admitting: Family Medicine

## 2016-02-24 ENCOUNTER — Telehealth: Payer: Self-pay | Admitting: Internal Medicine

## 2016-02-24 MED ORDER — FENOFIBRATE MICRONIZED 130 MG PO CAPS: 130.0000 mg | ORAL_CAPSULE | Freq: Every day | ORAL | 0 refills | Status: DC

## 2016-02-24 MED FILL — FENOFIBRATE 130 MG CAPSULE: 130 | 90 days supply | Qty: 90 | Fill #0

## 2016-02-24 NOTE — Telephone Encounter (Signed)
Patient checking on the status of message below, please advise best # [phone]

## 2016-02-24 NOTE — Telephone Encounter (Signed)
Please advise Sir, thank you. 

## 2016-02-24 NOTE — Telephone Encounter (Signed)
We will need to have her pharmacy tell us what strength is on her formularyr

## 2016-02-24 NOTE — Telephone Encounter (Signed)
For the upcoming 2018 year the patients new insurance will not be affordable for the fenofibrate.  It would cost her a total of $900 per month.  So she will need an alternative in 2018.

## 2016-02-24 NOTE — Telephone Encounter (Signed)
We can try to do a refill on each w/ same # she has- might not get approved

## 2016-02-25 MED ORDER — DIPHENOXYLATE-ATROPINE 2.5-0.025 MG PO TABS: 2.0000 | ORAL_TABLET | Freq: Three times a day (TID) | ORAL | 3 refills | Status: DC | PRN

## 2016-02-25 MED ORDER — DICYCLOMINE HCL 10 MG PO CAPS: 10.0000 mg | ORAL_CAPSULE | Freq: Three times a day (TID) | ORAL | 3 refills | Status: DC

## 2016-02-25 MED ORDER — POTASSIUM CHLORIDE CRYS ER 20 MEQ PO TBCR: 20.0000 meq | EXTENDED_RELEASE_TABLET | Freq: Every day | ORAL | 0 refills | Status: DC

## 2016-02-25 MED FILL — DIPHENOXYLATE/ATROPINE TAB: 2.5-0.025 | 25 days supply | Qty: 150 | Fill #0

## 2016-02-25 MED FILL — KLOR-CON M20 TABLET: 20 | 90 days supply | Qty: 90 | Fill #0

## 2016-02-25 MED FILL — DICYCLOMINE 10 MG CAPSULE: 10 | 90 days supply | Qty: 270 | Fill #0

## 2016-02-25 NOTE — Telephone Encounter (Signed)
I spoke with Alexis Olson and told her that her refillls have been sent in as requested. Lomotil rx was faxed over to [facility] as it printed out.

## 2016-02-25 NOTE — Telephone Encounter (Signed)
Talked to patient informed her that claim has been resubmitted to her insurance per account note   PBO [id number] DOS 09.26.2017 Called [facility] and spoke with Baxter Flattery. [id number] Rep Verified [employer] follows Medicare guidelines and that claim was processed incorrectly. [employer] had processed claim out of network. Rep is sending claim back for Review. Claim#[id number].

## 2016-02-25 NOTE — Telephone Encounter (Signed)
Patient informed of PCP instructions. 

## 2016-02-28 MED FILL — FLUTICASONE PROP 50 MCG SPR: 50 | 30 days supply | Qty: 16 | Fill #2

## 2016-03-21 ENCOUNTER — Telehealth: Payer: Self-pay | Admitting: Family Medicine

## 2016-03-21 MED FILL — OMEPRAZOLE 20 MG CAPSULE DR: 20 | 90 days supply | Qty: 90 | Fill #3

## 2016-03-21 MED FILL — GABAPENTIN 300 MG CAPSULE: 300 | 90 days supply | Qty: 90 | Fill #1

## 2016-03-22 DIAGNOSIS — M170 Bilateral primary osteoarthritis of knee: Secondary | ICD-10-CM | POA: Diagnosis not present

## 2016-03-22 MED ORDER — AMITRIPTYLINE HCL 100 MG PO TABS: 100.0000 mg | ORAL_TABLET | Freq: Every day | ORAL | 0 refills | Status: DC

## 2016-03-22 MED FILL — CENTRATEX CAPSULE: 106-1 | 30 days supply | Qty: 60 | Fill #5

## 2016-03-22 MED FILL — AMITRIPTYLINE HCL 100 MG TA: 100 | 30 days supply | Qty: 30 | Fill #0

## 2016-03-22 MED FILL — ACETAMINOPHEN/COD #3 TABLET: 300-30 | 8 days supply | Qty: 30 | Fill #0

## 2016-03-22 MED FILL — ZOLPIDEM TARTRATE 5 MG TAB: 5 | 30 days supply | Qty: 30 | Fill #0

## 2016-03-22 MED FILL — ALPRAZolam 1 MG TABS: 1 | 30 days supply | Qty: 30 | Fill #0

## 2016-03-22 NOTE — Telephone Encounter (Signed)
Ambien and Xanax phoned in to [facility] for 30 day supplies. Elavil sent electronically. Alexis Olson is overdue for OV w/ Dr. Charlett Blake. Please call and schedule appt. Thank you.

## 2016-03-22 NOTE — Telephone Encounter (Signed)
Per Dr. Charlett Blake only 30 day supply can be given on Ambien and Xanax at this time.

## 2016-03-22 NOTE — Telephone Encounter (Signed)
Patient informed of message below, requesting a 90 day supply due to the cost, patient scheduled for  04/28/16 with PCP.

## 2016-03-22 NOTE — Telephone Encounter (Signed)
She can have a 1 month supply of her meds requested but she does not have a future appointment and she has not been seen since May. So she needs to be seen before further refills can be completed.

## 2016-03-22 NOTE — Telephone Encounter (Signed)
Patient informed, patient inquiring about 90 day supply of Elavil please advise patient directly.

## 2016-03-22 NOTE — Telephone Encounter (Signed)
90 day supply sent of Elavil.

## 2016-03-27 DIAGNOSIS — I82409 Acute embolism and thrombosis of unspecified deep veins of unspecified lower extremity: Secondary | ICD-10-CM

## 2016-03-27 HISTORY — DX: Acute embolism and thrombosis of unspecified deep veins of unspecified lower extremity: I82.409

## 2016-03-28 MED FILL — MONTELUKAST SOD 10 MG TAB: 10 | 90 days supply | Qty: 90 | Fill #0

## 2016-04-13 ENCOUNTER — Other Ambulatory Visit: Payer: Self-pay | Admitting: Internal Medicine

## 2016-04-14 MED FILL — CYANOCOBALAMIN 1,000 MCG/ML: 1000 | 90 days supply | Qty: 3 | Fill #0

## 2016-04-19 DIAGNOSIS — M170 Bilateral primary osteoarthritis of knee: Secondary | ICD-10-CM | POA: Diagnosis not present

## 2016-04-28 ENCOUNTER — Ambulatory Visit: Payer: PPO | Admitting: Family Medicine

## 2016-04-28 ENCOUNTER — Telehealth: Payer: Self-pay | Admitting: Family Medicine

## 2016-04-28 NOTE — Telephone Encounter (Signed)
The patient missed her appt. On 04/28/16 and had to reschedule.  Her new appt. Is 05/02/2016 and is it ok to wait until that appt. To do labs??

## 2016-04-28 NOTE — Telephone Encounter (Signed)
Yes OK to wait

## 2016-05-01 NOTE — Telephone Encounter (Signed)
paitent informed

## 2016-05-02 ENCOUNTER — Encounter: Payer: Self-pay | Admitting: Family Medicine

## 2016-05-02 ENCOUNTER — Ambulatory Visit (INDEPENDENT_AMBULATORY_CARE_PROVIDER_SITE_OTHER): Payer: PPO | Admitting: Family Medicine

## 2016-05-02 VITALS — BP 102/62 | HR 80 | Temp 97.6°F | Wt 189.2 lb

## 2016-05-02 DIAGNOSIS — M8580 Other specified disorders of bone density and structure, unspecified site: Secondary | ICD-10-CM

## 2016-05-02 DIAGNOSIS — E538 Deficiency of other specified B group vitamins: Secondary | ICD-10-CM

## 2016-05-02 DIAGNOSIS — K219 Gastro-esophageal reflux disease without esophagitis: Secondary | ICD-10-CM

## 2016-05-02 DIAGNOSIS — J209 Acute bronchitis, unspecified: Secondary | ICD-10-CM

## 2016-05-02 DIAGNOSIS — M170 Bilateral primary osteoarthritis of knee: Secondary | ICD-10-CM | POA: Diagnosis not present

## 2016-05-02 DIAGNOSIS — E785 Hyperlipidemia, unspecified: Secondary | ICD-10-CM

## 2016-05-02 DIAGNOSIS — E871 Hypo-osmolality and hyponatremia: Secondary | ICD-10-CM | POA: Diagnosis not present

## 2016-05-02 DIAGNOSIS — K529 Noninfective gastroenteritis and colitis, unspecified: Secondary | ICD-10-CM

## 2016-05-02 DIAGNOSIS — D649 Anemia, unspecified: Secondary | ICD-10-CM

## 2016-05-02 DIAGNOSIS — R000 Tachycardia, unspecified: Secondary | ICD-10-CM

## 2016-05-02 HISTORY — DX: Hypo-osmolality and hyponatremia: E87.1

## 2016-05-02 MED ORDER — DOXYCYCLINE HYCLATE 100 MG PO TABS: 100.0000 mg | ORAL_TABLET | Freq: Two times a day (BID) | ORAL | 0 refills | Status: DC

## 2016-05-02 MED ORDER — BENZONATATE 100 MG PO CAPS: 100.0000 mg | ORAL_CAPSULE | Freq: Two times a day (BID) | ORAL | 0 refills | Status: DC | PRN

## 2016-05-02 MED ORDER — ALPRAZOLAM 1 MG PO TABS: ORAL_TABLET | ORAL | 1 refills | Status: DC

## 2016-05-02 MED ORDER — ZOLPIDEM TARTRATE 5 MG PO TABS: 5.0000 mg | ORAL_TABLET | Freq: Every day | ORAL | 1 refills | Status: DC

## 2016-05-02 NOTE — Progress Notes (Signed)
Patient ID: Alexis Olson, female   DOB: [birth year], 83 y.o.   MRN: [id number]   Subjective:    Patient ID: Alexis Olson, female    DOB: 18-May-1933, 83 y.o.   MRN: [id number]  Chief Complaint  Patient presents with  . Follow-up  . Cough  . Nasal Congestion  I acted as a Education administrator for Dr. Charlett Blake. Princess, RMA   HPI  Patient is in today for follow up on numerous medical concerns including hyperlipidemia, osteopenia, vitamin B 12 deficiency and more. No recent hospitalization but she is not feeling well today. Is struggling with head congestion, cough productive of yellow phlegm at times. Also notes some fatigue, headache and malaise. Denies CP/palp/SOB/HA/fevers/GI or GU c/o. Taking meds as prescribed  Past Medical History:  Diagnosis Date  . ALLERGIC RHINITIS   . ANEMIA, CHRONIC    Malabsorption related to bypass hx, B12 and iron deficiency  . ANXIETY   . ASTHMA   . Bariatric surgery status   . Colon perforation ([place]) 08/2014   following polypetomy during colo, surgical repair  . COLONIC POLYPS, ADENOMATOUS, HX OF 2010  . DIVERTICULOSIS, COLON   . GERD (gastroesophageal reflux disease) 08/23/2015  . HSV (herpes simplex virus) infection 08/16/2015  . Hypertension   . Hyponatremia 05/02/2016  . INSOMNIA   . Lumbosacral disc disease    Chronic pain; History of osteomyelitis 2010 following ESI complication  . Obesity   . Recurrent UTI 08/23/2015  . URINARY URGENCY   . VITAMIN B12 DEFICIENCY     Past Surgical History:  Procedure Laterality Date  . [place]   for ruptured disc  . CHOLECYSTECTOMY    . ileojejunal bypass  1976   for obesity  . LAPAROTOMY N/A 09/18/2014   Procedure: EXPLORATORY LAPAROTOMY WITH REPAIR OF CECIAL PERFORATION;  Surgeon: Autumn Messing III, MD;  Location: [facility];  Service: General;  Laterality: N/A;  . TONSILLECTOMY      Family History  Problem Relation Age of Onset  . Cervical cancer Mother   . Stroke Mother   . Liver disease Sister   . Kidney  disease Sister   . Cirrhosis Sister   . Colon cancer Neg Hx   . Esophageal cancer Neg Hx   . Rectal cancer Neg Hx   . Stomach cancer Neg Hx     Social History   Social History  . Marital status: Married    Spouse name: N/A  . Number of children: 3  . Years of education: N/A   Occupational History  . retired Therapist, sports Retired   Social History Main Topics  . Smoking status: Never Smoker  . Smokeless tobacco: Never Used  . Alcohol use No  . Drug use: No  . Sexual activity: No   Other Topics Concern  . Not on file   Social History Narrative   Lives with spouse, Indep ADLs   Supportive family nearby   No dietary restrictions    Outpatient Medications Prior to Visit  Medication Sig Dispense Refill  . albuterol (PROVENTIL HFA;VENTOLIN HFA) 108 (90 BASE) MCG/ACT inhaler Inhale 1-2 puffs into the lungs every 6 (six) hours as needed for wheezing or shortness of breath.    Marland Kitchen amitriptyline (ELAVIL) 100 MG tablet Take 1 tablet (100 mg total) by mouth daily. 90 tablet 0  . azelastine (OPTIVAR) 0.05 % ophthalmic solution Place 1 drop into both eyes daily as needed (dry eyes).     . cetirizine (ZYRTEC) 10 MG tablet  Take 10 mg by mouth daily.     . cholecalciferol (VITAMIN D) 1000 UNITS tablet Take 1,000 Units by mouth daily.    . cyanocobalamin (,VITAMIN B-12,) 1000 MCG/ML injection INJECT 1 ML INTO THE MUSCLE EVERY 30 DAYS. 6 mL 0  . dicyclomine (BENTYL) 10 MG capsule Take 1 capsule (10 mg total) by mouth 3 (three) times daily. 270 capsule 3  . Diphenhyd-Hydrocort-Nystatin (FIRST-DUKES MOUTHWASH) SUSP Take 1 to 2 teaspoonfuls every 4 to 6 hours as directed. 120 mL 2  . diphenoxylate-atropine (LOMOTIL) 2.5-0.025 MG tablet Take 2 tablets by mouth 3 (three) times daily as needed for diarrhea or loose stools. 150 tablet 3  . Fe Fum-FA-B Cmp-C-Zn-Mg-Mn-Cu (HEMOCYTE PLUS) 106-1 MG CAPS Take 1 capsule by mouth 2 (two) times daily. 60 each 11  . fenofibrate micronized (ANTARA) 130 MG capsule Take 1  capsule (130 mg total) by mouth daily. 90 capsule 0  . fluticasone (FLONASE) 50 MCG/ACT nasal spray 2 sprays by Nasal route daily.      Marland Kitchen gabapentin (NEURONTIN) 300 MG capsule TAKE 1 CAPSULE BY MOUTH AT BEDTIME. 90 capsule 3  . hydrOXYzine (ATARAX) 25 MG tablet Take 25 mg by mouth 3 (three) times daily as needed for itching.     . levocetirizine (XYZAL) 5 MG tablet Take 1 tablet (5 mg total) by mouth every evening. 90 tablet 3  . magnesium oxide (MAG-OX 400) 400 MG tablet Take 400 mg by mouth 2 (two) times daily.     . montelukast (SINGULAIR) 10 MG tablet Take 10 mg by mouth at bedtime.     Marland Kitchen omeprazole (PRILOSEC) 20 MG capsule Take 1 capsule (20 mg total) by mouth daily. 90 capsule 3  . polyethylene glycol powder (MIRALAX) powder Take 17 g by mouth daily. 255 g 0  . potassium chloride SA (KLOR-CON M20) 20 MEQ tablet Take 1 tablet (20 mEq total) by mouth daily. 90 tablet 0  . PX ENTERIC ASPIRIN 81 MG EC tablet Take 81 mg by mouth daily.     . valACYclovir (VALTREX) 500 MG tablet Take 500 mg by mouth as needed.      . vitamin E 400 UNIT capsule Take 400 Units by mouth daily.      Marland Kitchen ALPRAZolam (XANAX) 1 MG tablet TAKE 1/2 - 1 TABLET BY MOUTH AT BEDTIME AS NEEDED 30 tablet 0  . cephALEXin (KEFLEX) 500 MG capsule Take 1 capsule (500 mg total) by mouth 4 (four) times daily. Take all of medicine and drink lots of fluids 20 capsule 0  . zolpidem (AMBIEN) 5 MG tablet TAKE 1 TABLET BY MOUTH AT BEDTIME 30 tablet 0   No facility-administered medications prior to visit.     Allergies  Allergen Reactions  . Other Anaphylaxis    Red peppers--choking  . Azithromycin Other (See Comments)    diarrhea  . Benadryl [Diphenhydramine Hcl] Other (See Comments)    Paradoxical response.  . Ciprofloxacin Hcl     REACTION: causes yeast inf  and refuses to take  . Codeine Phosphate     REACTION: unspecified  . Hydrocodone Itching and Nausea And Vomiting  . Hydrocodone-Homatropine Itching and Nausea And Vomiting     REACTION: unspecified  . Levaquin [Levofloxacin In D5w] Other (See Comments)    Muscle soreness  . Meperidine Hcl   . Sulfa Antibiotics Other (See Comments)    Joint pain  . Ultram [Tramadol Hcl] Other (See Comments)    Can't move joints    Review of Systems  Constitutional: Positive for malaise/fatigue. Negative for fever.  HENT: Positive for congestion.   Eyes: Negative for blurred vision.  Respiratory: Positive for cough, sputum production and shortness of breath.   Cardiovascular: Negative for chest pain, palpitations and leg swelling.  Gastrointestinal: Negative for abdominal pain, blood in stool and nausea.  Genitourinary: Negative for dysuria and frequency.  Musculoskeletal: Negative for falls.  Skin: Negative for rash.  Neurological: Positive for headaches. Negative for dizziness and loss of consciousness.  Endo/Heme/Allergies: Negative for environmental allergies.  Psychiatric/Behavioral: Negative for depression. The patient is not nervous/anxious.        Objective:    Physical Exam  Constitutional: She is oriented to person, place, and time. She appears well-developed and well-nourished. No distress.  HENT:  Head: Normocephalic and atraumatic.  Nose: Nose normal.  Dry mucus membranes  Eyes: Right eye exhibits no discharge. Left eye exhibits no discharge.  Neck: Normal range of motion. Neck supple.  Cardiovascular: Normal rate and regular rhythm.   No murmur heard. Pulmonary/Chest: Effort normal and breath sounds normal.  Abdominal: Soft. Bowel sounds are normal. There is no tenderness.  Musculoskeletal: She exhibits no edema.  Neurological: She is alert and oriented to person, place, and time.  Skin: Skin is warm and dry.  Psychiatric: She has a normal mood and affect.  Nursing note and vitals reviewed.   BP 102/62 (BP Location: Left Arm, Patient Position: Sitting, Cuff Size: Normal)   Pulse 80   Temp 97.6 F (36.4 C) (Oral)   Wt 189 lb 3.2 oz (85.8  kg)   SpO2 95%   BMI 31.48 kg/m  Wt Readings from Last 3 Encounters:  05/02/16 189 lb 3.2 oz (85.8 kg)  02/07/16 183 lb 6 oz (83.2 kg)  10/20/15 185 lb (83.9 kg)     Lab Results  Component Value Date   WBC 8.8 02/07/2016   HGB 12.5 02/07/2016   HCT 37.2 02/07/2016   PLT 317.0 02/07/2016   GLUCOSE 94 11/30/2015   CHOL 153 08/17/2015   TRIG 316.0 (H) 08/17/2015   HDL 40.20 08/17/2015   LDLDIRECT 66.0 08/17/2015   LDLCALC 92 12/27/2010   ALT 11 09/14/2015   AST 14 09/14/2015   NA 132 (L) 11/30/2015   K 3.9 11/30/2015   CL 99 11/30/2015   CREATININE 0.69 11/30/2015   BUN 15 11/30/2015   CO2 29 11/30/2015   TSH 1.20 08/17/2015   INR 1.01 05/13/2009   HGBA1C 5.3 08/03/2006    Lab Results  Component Value Date   TSH 1.20 08/17/2015   Lab Results  Component Value Date   WBC 8.8 02/07/2016   HGB 12.5 02/07/2016   HCT 37.2 02/07/2016   MCV 92.7 02/07/2016   PLT 317.0 02/07/2016   Lab Results  Component Value Date   NA 132 (L) 11/30/2015   K 3.9 11/30/2015   CO2 29 11/30/2015   GLUCOSE 94 11/30/2015   BUN 15 11/30/2015   CREATININE 0.69 11/30/2015   BILITOT 0.3 09/14/2015   ALKPHOS 32 (L) 09/14/2015   AST 14 09/14/2015   ALT 11 09/14/2015   PROT 6.6 09/14/2015   ALBUMIN 4.1 09/14/2015   CALCIUM 8.9 11/30/2015   ANIONGAP 9 09/26/2014   GFR 86.88 11/30/2015   Lab Results  Component Value Date   CHOL 153 08/17/2015   Lab Results  Component Value Date   HDL 40.20 08/17/2015   Lab Results  Component Value Date   LDLCALC 92 12/27/2010   Lab Results  Component  Value Date   TRIG 316.0 (H) 08/17/2015   Lab Results  Component Value Date   CHOLHDL 4 08/17/2015   Lab Results  Component Value Date   HGBA1C 5.3 08/03/2006       Assessment & Plan:   Problem List Items Addressed This Visit    Anemia (Chronic)   Vitamin B 12 deficiency    Continue treatment and monitoring.       Bronchitis, acute    Encouraged increased rest and hydration, add  probiotics, zinc such as Coldeze or Xicam. Treat fevers as needed. Started on Doxycycline and Mucinex      Chronic diarrhea   Osteopenia, senile    Encouraged to get adequate exercise, calcium and vitamin d intake      Hyperlipidemia, mild - Primary    Encouraged heart healthy diet, increase exercise, avoid trans fats, consider a krill oil cap daily      Relevant Orders   Lipid panel   TSH   GERD (gastroesophageal reflux disease)    Avoid offending foods, start probiotics. Do not eat large meals in late evening and consider raising head of bed.       Hyponatremia    Other Visit Diagnoses    Tachycardia       Relevant Orders   CBC   Comprehensive metabolic panel      I have discontinued Ms. Pam's cephALEXin. I have also changed her zolpidem. Additionally, I am having her start on benzonatate and doxycycline. Lastly, I am having her maintain her vitamin E, fluticasone, hydrOXYzine, magnesium oxide, azelastine, valACYclovir, montelukast, PX ENTERIC ASPIRIN, cetirizine, levocetirizine, cholecalciferol, albuterol, HEMOCYTE PLUS, omeprazole, polyethylene glycol powder, gabapentin, FIRST-DUKES MOUTHWASH, fenofibrate micronized, potassium chloride SA, dicyclomine, diphenoxylate-atropine, amitriptyline, cyanocobalamin, and ALPRAZolam.  Meds ordered this encounter  Medications  . zolpidem (AMBIEN) 5 MG tablet    Sig: Take 1 tablet (5 mg total) by mouth at bedtime.    Dispense:  90 tablet    Refill:  1  . ALPRAZolam (XANAX) 1 MG tablet    Sig: TAKE 1/2 - 1 TABLET BY MOUTH AT BEDTIME AS NEEDED    Dispense:  90 tablet    Refill:  1  . benzonatate (TESSALON) 100 MG capsule    Sig: Take 1-2 capsules (100-200 mg total) by mouth 2 (two) times daily as needed for cough.    Dispense:  30 capsule    Refill:  0  . doxycycline (VIBRA-TABS) 100 MG tablet    Sig: Take 1 tablet (100 mg total) by mouth 2 (two) times daily.    Dispense:  20 tablet    Refill:  0    CMA served as scribe  during this visit. History, Physical and Plan performed by medical provider. Documentation and orders reviewed and attested to.  Penni Homans, MD

## 2016-05-02 NOTE — Patient Instructions (Addendum)
Encouraged increased rest and hydration, add probiotics, zinc such as Coldeze or Xicam. Treat fevers as needed. Plain mucinex and Vitamin C 500 mg daily, elderberry  Cholesterol Cholesterol is a white, waxy, fat-like substance that is needed by the human body in small amounts. The liver makes all the cholesterol we need. Cholesterol is carried from the liver by the blood through the blood vessels. Deposits of cholesterol (plaques) may build up on blood vessel (artery) walls. Plaques make the arteries narrower and stiffer. Cholesterol plaques increase the risk for heart attack and stroke. You cannot feel your cholesterol level even if it is very high. The only way to know that it is high is to have a blood test. Once you know your cholesterol levels, you should keep a record of the test results. Work with your health care provider to keep your levels in the desired range. What do the results mean?  Total cholesterol is a rough measure of all the cholesterol in your blood.  LDL (low-density lipoprotein) is the "bad" cholesterol. This is the type that causes plaque to build up on the artery walls. You want this level to be low.  HDL (high-density lipoprotein) is the "good" cholesterol because it cleans the arteries and carries the LDL away. You want this level to be high.  Triglycerides are fat that the body can either burn for energy or store. High levels are closely linked to heart disease. What are the desired levels of cholesterol?  Total cholesterol below 200.  LDL below 100 for people who are at risk, below 70 for people at very high risk.  HDL above 40 is good. A level of 60 or higher is considered to be protective against heart disease.  Triglycerides below 150. How can I lower my cholesterol? Diet  Follow your diet program as told by your health care provider.  Choose fish or white meat chicken and [place], roasted or baked. Limit fatty cuts of red meat, fried foods, and processed  meats, such as sausage and lunch meats.  Eat lots of fresh fruits and vegetables.  Choose whole grains, beans, pasta, potatoes, and cereals.  Choose olive oil, corn oil, or canola oil, and use only small amounts.  Avoid butter, mayonnaise, shortening, or palm kernel oils.  Avoid foods with trans fats.  Drink skim or nonfat milk and eat low-fat or nonfat yogurt and cheeses. Avoid whole milk, cream, ice cream, egg yolks, and full-fat cheeses.  Healthier desserts include angel food cake, ginger snaps, animal crackers, hard candy, popsicles, and low-fat or nonfat frozen yogurt. Avoid pastries, cakes, pies, and cookies. Exercise  Follow your exercise program as told by your health care provider. A regular program:  Helps to decrease LDL and raise HDL.  Helps with weight control.  Do things that increase your activity level, such as gardening, walking, and taking the stairs.  Ask your health care provider about ways that you can be more active in your daily life. Medicine  Take over-the-counter and prescription medicines only as told by your health care provider.  Medicine may be prescribed by your health care provider to help lower cholesterol and decrease the risk for heart disease. This is usually done if diet and exercise have failed to bring down cholesterol levels.  If you have several risk factors, you may need medicine even if your levels are normal. This information is not intended to replace advice given to you by your health care provider. Make sure you discuss any questions you  have with your health care provider. Document Released: 12/06/2000 Document Revised: 10/09/2015 Document Reviewed: 09/11/2015 Elsevier Interactive Patient Education  2017 [employer].

## 2016-05-02 NOTE — Progress Notes (Signed)
Pre visit review using our clinic review tool, if applicable. No additional management support is needed unless otherwise documented below in the visit note. 

## 2016-05-04 ENCOUNTER — Other Ambulatory Visit: Payer: Self-pay | Admitting: Family Medicine

## 2016-05-04 MED FILL — ZOLPIDEM TARTRATE 5 MG TAB: 5 | 90 days supply | Qty: 90 | Fill #0

## 2016-05-04 MED FILL — DOXYCYCLINE HYCLATE 100 MG: 100 | 10 days supply | Qty: 20 | Fill #0

## 2016-05-04 MED FILL — AMITRIPTYLINE HCL 100 MG TA: 100 | 90 days supply | Qty: 90 | Fill #0

## 2016-05-04 MED FILL — ALPRAZolam 1 MG TABS: 1 | 90 days supply | Qty: 90 | Fill #0

## 2016-05-04 MED FILL — BENZONATATE 100 MG CAP: 100 | 7 days supply | Qty: 30 | Fill #0

## 2016-05-07 NOTE — Assessment & Plan Note (Signed)
Avoid offending foods, start probiotics. Do not eat large meals in late evening and consider raising head of bed.  

## 2016-05-07 NOTE — Assessment & Plan Note (Signed)
Encouraged heart healthy diet, increase exercise, avoid trans fats, consider a krill oil cap daily 

## 2016-05-07 NOTE — Assessment & Plan Note (Signed)
Encouraged to get adequate exercise, calcium and vitamin d intake 

## 2016-05-07 NOTE — Assessment & Plan Note (Signed)
Encouraged increased rest and hydration, add probiotics, zinc such as Coldeze or Xicam. Treat fevers as needed. Started on Doxycycline and Mucinex

## 2016-05-07 NOTE — Assessment & Plan Note (Signed)
Continue treatment and monitoring.

## 2016-05-09 DIAGNOSIS — N3020 Other chronic cystitis without hematuria: Secondary | ICD-10-CM | POA: Diagnosis not present

## 2016-05-11 DIAGNOSIS — M170 Bilateral primary osteoarthritis of knee: Secondary | ICD-10-CM | POA: Diagnosis not present

## 2016-05-11 MED FILL — LEVOCETIRIZINE 5 MG TABLET: 5 | 30 days supply | Qty: 30 | Fill #0

## 2016-05-15 DIAGNOSIS — J3089 Other allergic rhinitis: Secondary | ICD-10-CM | POA: Diagnosis not present

## 2016-05-15 DIAGNOSIS — J3081 Allergic rhinitis due to animal (cat) (dog) hair and dander: Secondary | ICD-10-CM | POA: Diagnosis not present

## 2016-05-15 DIAGNOSIS — J4530 Mild persistent asthma, uncomplicated: Secondary | ICD-10-CM | POA: Diagnosis not present

## 2016-05-15 DIAGNOSIS — J301 Allergic rhinitis due to pollen: Secondary | ICD-10-CM | POA: Diagnosis not present

## 2016-05-18 DIAGNOSIS — M170 Bilateral primary osteoarthritis of knee: Secondary | ICD-10-CM | POA: Diagnosis not present

## 2016-05-18 MED FILL — FLUTICASONE PROP 50 MCG SPR: 50 | 30 days supply | Qty: 16 | Fill #3

## 2016-05-22 DIAGNOSIS — M170 Bilateral primary osteoarthritis of knee: Secondary | ICD-10-CM | POA: Diagnosis not present

## 2016-05-25 ENCOUNTER — Other Ambulatory Visit: Payer: Self-pay | Admitting: Family Medicine

## 2016-05-25 DIAGNOSIS — M170 Bilateral primary osteoarthritis of knee: Secondary | ICD-10-CM | POA: Diagnosis not present

## 2016-05-25 MED FILL — POTASSIUM CL ER 20 MEQ TAB: 20 | 90 days supply | Qty: 90 | Fill #0

## 2016-05-26 MED FILL — CEPHALEXIN 250 MG CAPSULE: 250 | 90 days supply | Qty: 90 | Fill #1

## 2016-05-29 ENCOUNTER — Other Ambulatory Visit: Payer: Self-pay | Admitting: Internal Medicine

## 2016-05-29 DIAGNOSIS — M170 Bilateral primary osteoarthritis of knee: Secondary | ICD-10-CM | POA: Diagnosis not present

## 2016-05-30 MED FILL — CENTRATEX CAPSULE: 106-1 | 30 days supply | Qty: 60 | Fill #0

## 2016-06-01 DIAGNOSIS — M170 Bilateral primary osteoarthritis of knee: Secondary | ICD-10-CM | POA: Diagnosis not present

## 2016-06-08 DIAGNOSIS — M170 Bilateral primary osteoarthritis of knee: Secondary | ICD-10-CM | POA: Diagnosis not present

## 2016-06-12 ENCOUNTER — Other Ambulatory Visit (INDEPENDENT_AMBULATORY_CARE_PROVIDER_SITE_OTHER): Payer: PPO

## 2016-06-12 DIAGNOSIS — R000 Tachycardia, unspecified: Secondary | ICD-10-CM | POA: Diagnosis not present

## 2016-06-12 DIAGNOSIS — E785 Hyperlipidemia, unspecified: Secondary | ICD-10-CM | POA: Diagnosis not present

## 2016-06-12 DIAGNOSIS — M170 Bilateral primary osteoarthritis of knee: Secondary | ICD-10-CM | POA: Diagnosis not present

## 2016-06-13 LAB — LIPID PANEL
Cholesterol: 158 mg/dL (ref 0–200)
HDL: 60.3 mg/dL (ref >39.00)
LDL Cholesterol: 68 mg/dL (ref 0–99)
NonHDL: 97.49
Total CHOL/HDL Ratio: 3
Triglycerides: 145 mg/dL (ref 0.0–149.0)
VLDL: 29 mg/dL (ref 0.0–40.0)

## 2016-06-13 LAB — CBC
HCT: 35.5 % — ABNORMAL LOW (ref 36.0–46.0)
Hemoglobin: 11.7 g/dL — ABNORMAL LOW (ref 12.0–15.0)
MCHC: 32.9 g/dL (ref 30.0–36.0)
MCV: 96.5 fl (ref 78.0–100.0)
Platelets: 272 10*3/uL (ref 150.0–400.0)
RBC: 3.68 Mil/uL — ABNORMAL LOW (ref 3.87–5.11)
RDW: 14.1 % (ref 11.5–15.5)
WBC: 8.1 10*3/uL (ref 4.0–10.5)

## 2016-06-13 LAB — COMPREHENSIVE METABOLIC PANEL
ALT: 11 U/L (ref 0–35)
AST: 13 U/L (ref 0–37)
Albumin: 4 g/dL (ref 3.5–5.2)
Alkaline Phosphatase: 40 U/L (ref 39–117)
BUN: 13 mg/dL (ref 6–23)
CO2: 27 mEq/L (ref 19–32)
Calcium: 9.2 mg/dL (ref 8.4–10.5)
Chloride: 100 mEq/L (ref 96–112)
Creatinine, Ser: 0.48 mg/dL (ref 0.40–1.20)
GFR: 131.89 mL/min (ref >60.00)
Glucose, Bld: 87 mg/dL (ref 70–99)
Potassium: 3.9 mEq/L (ref 3.5–5.1)
Sodium: 133 mEq/L — ABNORMAL LOW (ref 135–145)
Total Bilirubin: 0.3 mg/dL (ref 0.2–1.2)
Total Protein: 6.6 g/dL (ref 6.0–8.3)

## 2016-06-13 LAB — TSH: TSH: 0.96 u[IU]/mL (ref 0.35–4.50)

## 2016-06-15 DIAGNOSIS — M170 Bilateral primary osteoarthritis of knee: Secondary | ICD-10-CM | POA: Diagnosis not present

## 2016-06-19 MED FILL — METHOCARBAMOL 500 MG TABLET: 500 | 10 days supply | Qty: 30 | Fill #0

## 2016-06-19 MED FILL — LEVOCETIRIZINE 5 MG TABLET: 5 | 90 days supply | Qty: 90 | Fill #0

## 2016-06-20 DIAGNOSIS — M170 Bilateral primary osteoarthritis of knee: Secondary | ICD-10-CM | POA: Diagnosis not present

## 2016-06-22 DIAGNOSIS — M170 Bilateral primary osteoarthritis of knee: Secondary | ICD-10-CM | POA: Diagnosis not present

## 2016-06-26 ENCOUNTER — Other Ambulatory Visit: Payer: Self-pay | Admitting: Internal Medicine

## 2016-06-26 DIAGNOSIS — M170 Bilateral primary osteoarthritis of knee: Secondary | ICD-10-CM | POA: Diagnosis not present

## 2016-06-26 MED FILL — OMEPRAZOLE 20 MG CAPSULE DR: 20 | 90 days supply | Qty: 90 | Fill #0

## 2016-06-26 MED FILL — GABAPENTIN 300 MG CAPSULE: 300 | 90 days supply | Qty: 90 | Fill #2

## 2016-06-28 ENCOUNTER — Telehealth: Payer: Self-pay | Admitting: Family Medicine

## 2016-06-28 NOTE — Telephone Encounter (Signed)
Patient request to have copy of last labs mailed to her

## 2016-06-29 NOTE — Telephone Encounter (Signed)
Mailed a copy to her.

## 2016-07-03 MED FILL — MONTELUKAST SOD 10 MG TAB: 10 | 90 days supply | Qty: 90 | Fill #0

## 2016-07-05 ENCOUNTER — Telehealth: Payer: Self-pay | Admitting: General Practice

## 2016-07-05 NOTE — Telephone Encounter (Signed)
I spoke with the patient who confirmed that Alexis Olson is her PCP. Duane Lope ([place])

## 2016-07-21 MED FILL — CENTRATEX CAPSULE: 106-1 | 30 days supply | Qty: 60 | Fill #1

## 2016-07-31 MED FILL — ZOLPIDEM TARTRATE 5 MG TAB: 5 | 90 days supply | Qty: 90 | Fill #1

## 2016-07-31 MED FILL — FLUTICASONE PROP 50 MCG SPR: 50 | 30 days supply | Qty: 16 | Fill #0

## 2016-07-31 MED FILL — ALPRAZolam 1 MG TABS: 1 | 90 days supply | Qty: 90 | Fill #1

## 2016-08-03 MED FILL — AMITRIPTYLINE HCL 100 MG TA: 100 | 30 days supply | Qty: 30 | Fill #0

## 2016-08-07 MED FILL — DICLOFENAC SODIUM 1% GEL: 1 | 31 days supply | Qty: 500 | Fill #0

## 2016-08-15 MED FILL — METHOCARBAMOL 500 MG TABLET: 500 | 10 days supply | Qty: 30 | Fill #0

## 2016-08-22 ENCOUNTER — Telehealth: Payer: Self-pay | Admitting: Family Medicine

## 2016-08-22 ENCOUNTER — Other Ambulatory Visit: Payer: Self-pay | Admitting: Family Medicine

## 2016-08-22 MED FILL — POTASSIUM CL ER 20 MEQ TAB: 20 | 90 days supply | Qty: 90 | Fill #0

## 2016-08-22 NOTE — Telephone Encounter (Signed)
rx already taken care of.   PC

## 2016-08-22 NOTE — Telephone Encounter (Signed)
Pt req script potassium 90 day supply take once daily.

## 2016-08-31 DIAGNOSIS — M5410 Radiculopathy, site unspecified: Secondary | ICD-10-CM | POA: Diagnosis not present

## 2016-08-31 MED FILL — ACETAMINOPHEN/COD #3 TABLET: 300-30 | 7 days supply | Qty: 30 | Fill #0

## 2016-08-31 MED FILL — CYANOCOBALAMIN 1,000 MCG/ML: 1000 | 90 days supply | Qty: 3 | Fill #1

## 2016-08-31 MED FILL — METHOCARBAMOL 500 MG TABLET: 500 | 30 days supply | Qty: 90 | Fill #0

## 2016-09-04 ENCOUNTER — Telehealth: Payer: Self-pay | Admitting: Family Medicine

## 2016-09-04 NOTE — Telephone Encounter (Signed)
°  Relation to AN:VBTY Call back [phone] Pharmacy:  Reason for call:  Patient requesting 90 day supply amitriptyline (ELAVIL) 100 MG tablet please send to

## 2016-09-04 NOTE — Telephone Encounter (Signed)
Last refill #90 on 03/22/16 Last office visit 05/02/2016

## 2016-09-05 MED FILL — AMITRIPTYLINE HCL 100 MG TA: 100 | 30 days supply | Qty: 30 | Fill #0

## 2016-09-07 NOTE — Telephone Encounter (Signed)
She can have a 90 day supply as requested

## 2016-09-07 NOTE — Telephone Encounter (Signed)
Patient states 30 day was sent to pharmacy, patient requesting 90 day supply due to the cost being cheaper, patient would like a confirmation when 90 day supply is sent please advise best [phone]

## 2016-09-07 NOTE — Telephone Encounter (Signed)
Please advise ok refilled amitriptyline for #90 day supply due being more cost effective Last refill #90 on 03/22/2016 Last office visit  On 05/02/2016

## 2016-09-08 DIAGNOSIS — L918 Other hypertrophic disorders of the skin: Secondary | ICD-10-CM | POA: Diagnosis not present

## 2016-09-08 DIAGNOSIS — L821 Other seborrheic keratosis: Secondary | ICD-10-CM | POA: Diagnosis not present

## 2016-09-08 DIAGNOSIS — L814 Other melanin hyperpigmentation: Secondary | ICD-10-CM | POA: Diagnosis not present

## 2016-09-08 MED ORDER — AMITRIPTYLINE HCL 100 MG PO TABS: 100.0000 mg | ORAL_TABLET | Freq: Every day | ORAL | 0 refills | Status: DC

## 2016-09-08 NOTE — Telephone Encounter (Signed)
Sent in #90 day and patient notified

## 2016-09-08 NOTE — Addendum Note (Signed)
Addended by: Sharon Seller B on: 09/08/2016 07:22 AM   Modules accepted: Orders

## 2016-09-11 ENCOUNTER — Telehealth: Payer: Self-pay | Admitting: Family Medicine

## 2016-09-11 MED FILL — LEVOCETIRIZINE 5 MG TABLET: 5 | 90 days supply | Qty: 90 | Fill #1

## 2016-09-11 MED FILL — CENTRATEX CAPSULE: 106-1 | 30 days supply | Qty: 60 | Fill #2

## 2016-09-11 NOTE — Telephone Encounter (Signed)
Relation to pt: self  Call back number: [phone]   Reason for call:  Patient states she's feeling tired and would like blood work checked requesting orders, patient declined appointment with PCP. Patient will be going out of town this weekend, please advise

## 2016-09-12 ENCOUNTER — Other Ambulatory Visit: Payer: Self-pay | Admitting: Family Medicine

## 2016-09-12 DIAGNOSIS — N390 Urinary tract infection, site not specified: Secondary | ICD-10-CM

## 2016-09-12 DIAGNOSIS — D649 Anemia, unspecified: Secondary | ICD-10-CM

## 2016-09-12 DIAGNOSIS — E538 Deficiency of other specified B group vitamins: Secondary | ICD-10-CM

## 2016-09-12 NOTE — Telephone Encounter (Signed)
OK to check cbc, ua with c and s, cmp, tsh and vitamin B 12 level for vit B12 def., anemia, recurrent UTI but she really should consider being seen as well with her history of tachycardia a set of vitals might be helpful as well.

## 2016-09-12 NOTE — Telephone Encounter (Signed)
Patient notified/lab appt made/order entered. She does not want to come it at this time will wait for the lab results

## 2016-09-13 ENCOUNTER — Other Ambulatory Visit (INDEPENDENT_AMBULATORY_CARE_PROVIDER_SITE_OTHER): Payer: PPO

## 2016-09-13 DIAGNOSIS — N390 Urinary tract infection, site not specified: Secondary | ICD-10-CM | POA: Diagnosis not present

## 2016-09-13 DIAGNOSIS — D649 Anemia, unspecified: Secondary | ICD-10-CM

## 2016-09-13 DIAGNOSIS — E538 Deficiency of other specified B group vitamins: Secondary | ICD-10-CM

## 2016-09-14 LAB — CBC
HCT: 37.9 % (ref 36.0–46.0)
Hemoglobin: 12.5 g/dL (ref 12.0–15.0)
MCHC: 33 g/dL (ref 30.0–36.0)
MCV: 98.6 fl (ref 78.0–100.0)
Platelets: 285 10*3/uL (ref 150.0–400.0)
RBC: 3.84 Mil/uL — ABNORMAL LOW (ref 3.87–5.11)
RDW: 13.7 % (ref 11.5–15.5)
WBC: 8.6 10*3/uL (ref 4.0–10.5)

## 2016-09-14 LAB — COMPREHENSIVE METABOLIC PANEL
ALT: 13 U/L (ref 0–35)
AST: 14 U/L (ref 0–37)
Albumin: 4.3 g/dL (ref 3.5–5.2)
Alkaline Phosphatase: 38 U/L — ABNORMAL LOW (ref 39–117)
BUN: 12 mg/dL (ref 6–23)
CO2: 30 mEq/L (ref 19–32)
Calcium: 9.5 mg/dL (ref 8.4–10.5)
Chloride: 101 mEq/L (ref 96–112)
Creatinine, Ser: 0.55 mg/dL (ref 0.40–1.20)
GFR: 112.64 mL/min (ref >60.00)
Glucose, Bld: 84 mg/dL (ref 70–99)
Potassium: 3.5 mEq/L (ref 3.5–5.1)
Sodium: 137 mEq/L (ref 135–145)
Total Bilirubin: 0.4 mg/dL (ref 0.2–1.2)
Total Protein: 6.8 g/dL (ref 6.0–8.3)

## 2016-09-14 LAB — VITAMIN B12: Vitamin B-12: 297 pg/mL (ref 211–911)

## 2016-09-14 LAB — TSH: TSH: 0.73 u[IU]/mL (ref 0.35–4.50)

## 2016-09-18 MED FILL — GABAPENTIN 300 MG CAPSULE: 300 | 90 days supply | Qty: 90 | Fill #3

## 2016-09-18 MED FILL — OMEPRAZOLE 20 MG CAPSULE DR: 20 | 90 days supply | Qty: 90 | Fill #1

## 2016-09-21 ENCOUNTER — Telehealth: Payer: Self-pay | Admitting: Family Medicine

## 2016-09-21 NOTE — Telephone Encounter (Signed)
Caller name: Relation to MB:TDHR Call back number:[phone] Pharmacy:  Reason for call: pt states she had her lab done last week and is requesting a copy sent to her in the mail. States she was told they were sent but she has never received them

## 2016-09-21 NOTE — Telephone Encounter (Signed)
Results printed and remailed to Pt.

## 2016-09-26 DIAGNOSIS — M170 Bilateral primary osteoarthritis of knee: Secondary | ICD-10-CM | POA: Diagnosis not present

## 2016-10-03 MED FILL — AMITRIPTYLINE HCL 100 MG TA: 100 | 90 days supply | Qty: 90 | Fill #0

## 2016-10-05 MED FILL — MONTELUKAST SOD 10 MG TAB: 10 | 90 days supply | Qty: 90 | Fill #1

## 2016-10-27 ENCOUNTER — Telehealth: Payer: Self-pay | Admitting: Family Medicine

## 2016-10-27 NOTE — Telephone Encounter (Signed)
Requesting:  Alprazolam and zolpidem Contract   09/14/2015 UDS   Low risk Last OV    05/02/2016 Last Refill   Alprazolam   #90 with 1 refill on 05/02/2016                     Zolpidem   #90 with 1 refill on 05/02/2016  Please Advise

## 2016-10-27 NOTE — Telephone Encounter (Signed)
Pt called in to follow up on refill request.    Pharmacy: [facility], [place]: [phone]

## 2016-10-29 NOTE — Telephone Encounter (Signed)
Can have #90 of both meds but needs updated contract and UDS (date done?) by next refill

## 2016-10-30 ENCOUNTER — Other Ambulatory Visit: Payer: Self-pay | Admitting: Family Medicine

## 2016-10-30 MED FILL — ALPRAZolam 1 MG TABS: 1 | 90 days supply | Qty: 90 | Fill #0

## 2016-10-30 MED FILL — ZOLPIDEM TARTRATE 5 MG TAB: 5 | 90 days supply | Qty: 90 | Fill #0

## 2016-10-30 NOTE — Telephone Encounter (Signed)
Pt called in to follow up. She said that she spoke with pharmacy and was advised that they never received fax for medication.   Pt says that she is completely out.    Please assist further.

## 2016-10-30 NOTE — Telephone Encounter (Signed)
Faxed to [facility] 02/2016 contract and UDS

## 2016-10-30 NOTE — Telephone Encounter (Signed)
Alexis Olson states they have not received any refills, please advise

## 2016-10-30 NOTE — Telephone Encounter (Signed)
Alpazolam  #90 with 1 refill Pharmacy notified and patient refill done.

## 2016-10-30 NOTE — Telephone Encounter (Signed)
Telephoned in both prescriptions. Zolpidem  #90 with 1 refill

## 2016-11-14 MED FILL — POTASSIUM CL ER 20 MEQ TAB: 20 | 90 days supply | Qty: 90 | Fill #1

## 2016-11-15 MED FILL — CENTRATEX CAPSULE: 106-1 | 30 days supply | Qty: 60 | Fill #3

## 2016-11-16 MED FILL — FLUTICASONE PROP 50 MCG SPR: 50 | 30 days supply | Qty: 16 | Fill #1

## 2016-12-06 MED FILL — LEVOCETIRIZINE 5 MG TABLET: 5 | 90 days supply | Qty: 90 | Fill #2

## 2016-12-13 DIAGNOSIS — M170 Bilateral primary osteoarthritis of knee: Secondary | ICD-10-CM | POA: Diagnosis not present

## 2016-12-18 ENCOUNTER — Other Ambulatory Visit: Payer: Self-pay | Admitting: Family Medicine

## 2016-12-19 MED FILL — GABAPENTIN 300 MG CAPSULE: 300 | 90 days supply | Qty: 90 | Fill #0

## 2016-12-22 DIAGNOSIS — M170 Bilateral primary osteoarthritis of knee: Secondary | ICD-10-CM | POA: Diagnosis not present

## 2016-12-25 MED FILL — DICLOFENAC SODIUM 1% GEL: 1 | 31 days supply | Qty: 500 | Fill #0

## 2016-12-25 MED FILL — OMEPRAZOLE 20 MG CAP: 20 | 90 days supply | Qty: 90 | Fill #2

## 2016-12-27 DIAGNOSIS — M170 Bilateral primary osteoarthritis of knee: Secondary | ICD-10-CM | POA: Diagnosis not present

## 2016-12-28 ENCOUNTER — Other Ambulatory Visit: Payer: Self-pay | Admitting: Family Medicine

## 2016-12-29 MED FILL — AMITRIPTYLINE HCL 100 MG TA: 100 | 90 days supply | Qty: 90 | Fill #0

## 2017-01-03 MED FILL — MONTELUKAST SOD 10 MG TAB: 10 | 90 days supply | Qty: 90 | Fill #0

## 2017-01-04 ENCOUNTER — Encounter: Payer: PPO | Admitting: Family Medicine

## 2017-01-04 ENCOUNTER — Telehealth: Payer: Self-pay | Admitting: Family Medicine

## 2017-01-04 NOTE — Telephone Encounter (Signed)
Caller name: Hal[place]  Relation to pt: self  Call back number: [phone] Pharmacy:  Reason for call: Pt had an appt today at 2:30 for a CPE and also her spouse had a CPE appt today at 1:30 both pt had to be canceled since provider not in the office. Pt would like to know if she can get a CPE with spouse on the sameday before December 2018. Provider does not have any appt available, please advise what arrangement can be done for both pts (please verify with provider) so we can reschedule pts appt. Please advise.

## 2017-01-05 NOTE — Telephone Encounter (Signed)
We can see them the same day may be different times. But they can have an appointment the same day. Check on her Friday schedule

## 2017-01-10 ENCOUNTER — Other Ambulatory Visit: Payer: Self-pay | Admitting: Internal Medicine

## 2017-01-10 NOTE — Telephone Encounter (Signed)
Okay to refill Sir, it was checked in June.

## 2017-01-11 MED FILL — CYANOCOBALAMIN 1,000 MCG/ML: 1000 | 90 days supply | Qty: 3 | Fill #0

## 2017-01-11 NOTE — Telephone Encounter (Signed)
Refill x 1 year 

## 2017-01-18 ENCOUNTER — Other Ambulatory Visit: Payer: Self-pay | Admitting: Internal Medicine

## 2017-01-18 NOTE — Telephone Encounter (Signed)
Refill Sir?  CBC done 4 months ago.

## 2017-01-19 MED FILL — CENTRATEX CAPSULE: 106-1 | 30 days supply | Qty: 60 | Fill #4

## 2017-01-19 NOTE — Telephone Encounter (Signed)
I thought we stoped this  She had plenty of iron when last checked  I do not think she needs it

## 2017-01-23 MED FILL — ALPRAZolam 1 MG TABS: 1 | 90 days supply | Qty: 90 | Fill #1

## 2017-01-26 MED FILL — ZOLPIDEM TARTRATE 5 MG TABL: 5 | 90 days supply | Qty: 90 | Fill #1

## 2017-01-26 MED FILL — FLUTICASONE PROP 50 MCG SPR: 50 | 30 days supply | Qty: 16 | Fill #2

## 2017-01-29 DIAGNOSIS — M25562 Pain in left knee: Secondary | ICD-10-CM | POA: Diagnosis not present

## 2017-01-29 DIAGNOSIS — M170 Bilateral primary osteoarthritis of knee: Secondary | ICD-10-CM | POA: Diagnosis not present

## 2017-01-29 DIAGNOSIS — M25561 Pain in right knee: Secondary | ICD-10-CM | POA: Diagnosis not present

## 2017-01-29 MED FILL — MELOXICAM 7.5 MG TABLET: 7.5 | 30 days supply | Qty: 30 | Fill #0

## 2017-02-13 ENCOUNTER — Ambulatory Visit (INDEPENDENT_AMBULATORY_CARE_PROVIDER_SITE_OTHER): Payer: PPO | Admitting: Family Medicine

## 2017-02-13 ENCOUNTER — Encounter: Payer: Self-pay | Admitting: Family Medicine

## 2017-02-13 VITALS — BP 148/76 | HR 77 | Temp 97.9°F | Resp 16 | Ht 64.96 in | Wt 199.0 lb

## 2017-02-13 DIAGNOSIS — D649 Anemia, unspecified: Secondary | ICD-10-CM

## 2017-02-13 DIAGNOSIS — F411 Generalized anxiety disorder: Secondary | ICD-10-CM

## 2017-02-13 DIAGNOSIS — E538 Deficiency of other specified B group vitamins: Secondary | ICD-10-CM | POA: Diagnosis not present

## 2017-02-13 DIAGNOSIS — G4700 Insomnia, unspecified: Secondary | ICD-10-CM | POA: Diagnosis not present

## 2017-02-13 DIAGNOSIS — Z0000 Encounter for general adult medical examination without abnormal findings: Secondary | ICD-10-CM | POA: Diagnosis not present

## 2017-02-13 DIAGNOSIS — E785 Hyperlipidemia, unspecified: Secondary | ICD-10-CM | POA: Diagnosis not present

## 2017-02-13 DIAGNOSIS — Z79899 Other long term (current) drug therapy: Secondary | ICD-10-CM

## 2017-02-13 DIAGNOSIS — N390 Urinary tract infection, site not specified: Secondary | ICD-10-CM

## 2017-02-13 DIAGNOSIS — B009 Herpesviral infection, unspecified: Secondary | ICD-10-CM

## 2017-02-13 DIAGNOSIS — E871 Hypo-osmolality and hyponatremia: Secondary | ICD-10-CM

## 2017-02-13 MED ORDER — VALACYCLOVIR HCL 500 MG PO TABS: 500.0000 mg | ORAL_TABLET | ORAL | 5 refills | Status: DC | PRN

## 2017-02-13 MED ORDER — AMITRIPTYLINE HCL 100 MG PO TABS: 100.0000 mg | ORAL_TABLET | Freq: Every day | ORAL | 1 refills | Status: DC

## 2017-02-13 MED ORDER — ALPRAZOLAM 1 MG PO TABS: 0.5000 mg | ORAL_TABLET | Freq: Every evening | ORAL | 1 refills | Status: DC | PRN

## 2017-02-13 MED ORDER — ZOLPIDEM TARTRATE 5 MG PO TABS: 5.0000 mg | ORAL_TABLET | Freq: Every day | ORAL | 1 refills | Status: DC

## 2017-02-13 NOTE — Assessment & Plan Note (Addendum)
Check cmp today shows resolved

## 2017-02-13 NOTE — Progress Notes (Signed)
Subjective:  I acted as a Education administrator for [employer]. [name], [place]    Patient ID: [name], female    DOB: 01/23/1934, 83 y.o.   MRN: [id number]  Chief Complaint  Patient presents with  . Annual Exam    HPI  Patient is in today for annual exam and follow up on chronic medical concerns including hyperlipidemia, insomnia, anxiety and hyponatremia. She is doing well today. She is accompanied by her husband and does acknowledge her stress is contributed to by his health and the work she has to do for him. Is needing meds for sleep but then sleeps well. No side effects such as confusion or falls. Has followed with urology in past for UTIs but is not symptomatic at this time. Uses Alprazolam prn for anxiety with good results. Denies CP/palp/SOB/HA/congestion/fevers/GI or GU c/o. Taking meds as prescribed. Managing ADLs well at home. Trying to stay active and maintain a heart healthy diet.   Patient Care Team: Mosie Lukes, MD as PCP - General (Family Medicine) Rana Snare, MD (Urology) Paula Compton, MD (Obstetrics and Gynecology) Drema Dallas, MD (Physical Medicine and Rehabilitation) Mosetta Anis, MD (Allergy) Suella Broad, MD (Physical Medicine and Rehabilitation) Katy Apo, MD (Ophthalmology)   Past Medical History:  Diagnosis Date  . ALLERGIC RHINITIS   . ANEMIA, CHRONIC    Malabsorption related to bypass hx, B12 and iron deficiency  . ANXIETY   . ASTHMA   . Bariatric surgery status   . Colon perforation ([place]) 08/2014   following polypetomy during colo, surgical repair  . COLONIC POLYPS, ADENOMATOUS, HX OF 2010  . DIVERTICULOSIS, COLON   . GERD (gastroesophageal reflux disease) 08/23/2015  . HSV (herpes simplex virus) infection 08/16/2015  . Hypertension   . Hyponatremia 05/02/2016  . INSOMNIA   . Lumbosacral disc disease    Chronic pain; History of osteomyelitis 2010 following ESI complication  . Obesity   . Recurrent UTI 08/23/2015  . URINARY URGENCY    . VITAMIN B12 DEFICIENCY     Past Surgical History:  Procedure Laterality Date  . [place]   for ruptured disc  . CHOLECYSTECTOMY    . ileojejunal bypass  1976   for obesity  . LAPAROTOMY N/A 09/18/2014   Procedure: EXPLORATORY LAPAROTOMY WITH REPAIR OF CECIAL PERFORATION;  Surgeon: Autumn Messing III, MD;  Location: Agency;  Service: General;  Laterality: N/A;  . TONSILLECTOMY      Family History  Problem Relation Age of Onset  . Cervical cancer Mother   . Stroke Mother   . Liver disease Sister   . Kidney disease Sister   . Cirrhosis Sister   . Colon cancer Neg Hx   . Esophageal cancer Neg Hx   . Rectal cancer Neg Hx   . Stomach cancer Neg Hx     Social History   Socioeconomic History  . Marital status: Married    Spouse name: Not on file  . Number of children: 3  . Years of education: Not on file  . Highest education level: Not on file  Social Needs  . Financial resource strain: Not on file  . Food insecurity - worry: Not on file  . Food insecurity - inability: Not on file  . Transportation needs - medical: Not on file  . Transportation needs - non-medical: Not on file  Occupational History  . Occupation: retired Programmer, multimedia: RETIRED  Tobacco Use  . Smoking status: Never Smoker  .  Smokeless tobacco: Never Used  Substance and Sexual Activity  . Alcohol use: No    Alcohol/week: 0.0 oz  . Drug use: No  . Sexual activity: No    Partners: Male  Other Topics Concern  . Not on file  Social History Narrative   Lives with spouse, Indep ADLs   Supportive family nearby   No dietary restrictions    Outpatient Medications Prior to Visit  Medication Sig Dispense Refill  . albuterol (PROVENTIL HFA;VENTOLIN HFA) 108 (90 BASE) MCG/ACT inhaler Inhale 1-2 puffs into the lungs every 6 (six) hours as needed for wheezing or shortness of breath.    Marland Kitchen azelastine (OPTIVAR) 0.05 % ophthalmic solution Place 1 drop into both eyes daily as needed (dry eyes).     .  benzonatate (TESSALON) 100 MG capsule Take 1-2 capsules (100-200 mg total) by mouth 2 (two) times daily as needed for cough. 30 capsule 0  . cetirizine (ZYRTEC) 10 MG tablet Take 10 mg by mouth daily.     . cholecalciferol (VITAMIN D) 1000 UNITS tablet Take 1,000 Units by mouth daily.    . cyanocobalamin (,VITAMIN B-12,) 1000 MCG/ML injection INJECT 1 ML INTO THE MUSCLE EVERY 30 DAYS. 6 mL 1  . dicyclomine (BENTYL) 10 MG capsule Take 1 capsule (10 mg total) by mouth 3 (three) times daily. 270 capsule 3  . Diphenhyd-Hydrocort-Nystatin (FIRST-DUKES MOUTHWASH) SUSP Take 1 to 2 teaspoonfuls every 4 to 6 hours as directed. 120 mL 2  . diphenoxylate-atropine (LOMOTIL) 2.5-0.025 MG tablet Take 2 tablets by mouth 3 (three) times daily as needed for diarrhea or loose stools. 150 tablet 3  . doxycycline (VIBRA-TABS) 100 MG tablet Take 1 tablet (100 mg total) by mouth 2 (two) times daily. 20 tablet 0  . Fe Fum-FA-B Cmp-C-Zn-Mg-Mn-Cu (CENTRATEX) 106-1 MG CAPS TAKE 1 CAPSULE BY MOUTH 2 TIMES DAILY. 60 each 5  . fenofibrate micronized (ANTARA) 130 MG capsule Take 1 capsule (130 mg total) by mouth daily. 90 capsule 0  . fluticasone (FLONASE) 50 MCG/ACT nasal spray 2 sprays by Nasal route daily.      Marland Kitchen gabapentin (NEURONTIN) 300 MG capsule TAKE 1 CAPSULE BY MOUTH AT BEDTIME. 90 capsule 0  . hydrOXYzine (ATARAX) 25 MG tablet Take 25 mg by mouth 3 (three) times daily as needed for itching.     . levocetirizine (XYZAL) 5 MG tablet Take 1 tablet (5 mg total) by mouth every evening. 90 tablet 3  . magnesium oxide (MAG-OX 400) 400 MG tablet Take 400 mg by mouth 2 (two) times daily.     . montelukast (SINGULAIR) 10 MG tablet Take 10 mg by mouth at bedtime.     Marland Kitchen omeprazole (PRILOSEC) 20 MG capsule TAKE 1 CAPSULE BY MOUTH DAILY. 90 capsule 3  . polyethylene glycol powder (MIRALAX) powder Take 17 g by mouth daily. 255 g 0  . potassium chloride SA (K-DUR,KLOR-CON) 20 MEQ tablet TAKE 1 TABLET BY MOUTH ONCE DAILY 90 tablet 1    . PX ENTERIC ASPIRIN 81 MG EC tablet Take 81 mg by mouth daily.     . vitamin E 400 UNIT capsule Take 400 Units by mouth daily.      Marland Kitchen ALPRAZolam (XANAX) 1 MG tablet TAKE 1/2 TO 1 TABLET BY MOUTH AT BEDTIME AS NEEDED 90 tablet 1  . amitriptyline (ELAVIL) 100 MG tablet Take 1 tablet (100 mg total) by mouth daily. 90 tablet 0  . amitriptyline (ELAVIL) 100 MG tablet TAKE 1 TABLET BY MOUTH DAILY. 90 tablet  0  . valACYclovir (VALTREX) 500 MG tablet Take 500 mg by mouth as needed.      . zolpidem (AMBIEN) 5 MG tablet TAKE 1 TABLET BY MOUTH ONCE DAILY AT BEDTIME 90 tablet 1   No facility-administered medications prior to visit.     Allergies  Allergen Reactions  . Other Anaphylaxis    Red peppers--choking  . Azithromycin Other (See Comments)    diarrhea  . Benadryl [Diphenhydramine Hcl] Other (See Comments)    Paradoxical response.  . Ciprofloxacin Hcl     REACTION: causes yeast inf  and refuses to take  . Codeine Phosphate     REACTION: unspecified  . Hydrocodone Itching and Nausea And Vomiting  . Hydrocodone-Homatropine Itching and Nausea And Vomiting    REACTION: unspecified  . Levaquin [Levofloxacin In D5w] Other (See Comments)    Muscle soreness  . Meperidine Hcl   . Sulfa Antibiotics Other (See Comments)    Joint pain  . Ultram [Tramadol Hcl] Other (See Comments)    Can't move joints    Review of Systems  Constitutional: Negative for fever and malaise/fatigue.  HENT: Negative for congestion.   Respiratory: Negative for cough and shortness of breath.   Cardiovascular: Negative for chest pain and palpitations.  Gastrointestinal: Negative for vomiting.  Musculoskeletal: Negative for back pain.  Skin: Negative for rash.  Neurological: Negative for loss of consciousness and headaches.  Psychiatric/Behavioral: The patient is nervous/anxious and has insomnia.        Objective:    Physical Exam  Constitutional: She is oriented to person, place, and time. She appears  well-developed and well-nourished. No distress.  HENT:  Head: Normocephalic and atraumatic.  Eyes: Conjunctivae are normal.  Neck: Normal range of motion. No thyromegaly present.  Cardiovascular: Normal rate and regular rhythm.  Pulmonary/Chest: Effort normal and breath sounds normal. She has no wheezes.  Abdominal: Soft. Bowel sounds are normal. There is no tenderness.  Musculoskeletal: Normal range of motion. She exhibits no edema or deformity.  Neurological: She is alert and oriented to person, place, and time.  Skin: Skin is warm and dry. She is not diaphoretic.  Psychiatric: She has a normal mood and affect.    BP (!) 148/76 (BP Location: Right Arm, Patient Position: Sitting, Cuff Size: Large)   Pulse 77   Temp 97.9 F (36.6 C) (Oral)   Resp 16   Ht 5' 4.96" (1.65 m)   Wt 199 lb (90.3 kg)   SpO2 100%   BMI 33.16 kg/m  Wt Readings from Last 3 Encounters:  02/13/17 199 lb (90.3 kg)  05/02/16 189 lb 3.2 oz (85.8 kg)  02/07/16 183 lb 6 oz (83.2 kg)   BP Readings from Last 3 Encounters:  02/13/17 (!) 148/76  05/02/16 102/62  02/07/16 102/78     Immunization History  Administered Date(s) Administered  . Influenza Whole 12/24/2008  . Influenza, High Dose Seasonal PF 12/15/2013, 12/21/2015  . Influenza,inj,Quad PF,6+ Mos 12/27/2012  . Influenza-Unspecified 11/26/2011, 12/21/2014  . Pneumococcal Conjugate-13 02/12/2014  . Pneumococcal Polysaccharide-23 03/27/2005  . Td 02/12/2014  . Zoster 02/06/2012    Health Maintenance  Topic Date Due  . TETANUS/TDAP  02/13/2024  . INFLUENZA VACCINE  Completed  . DEXA SCAN  Completed  . PNA vac Low Risk Adult  Completed    Lab Results  Component Value Date   WBC 8.6 09/13/2016   HGB 12.5 09/13/2016   HCT 37.9 09/13/2016   PLT 285.0 09/13/2016   GLUCOSE 84 09/13/2016  CHOL 158 06/12/2016   TRIG 145.0 06/12/2016   HDL 60.30 06/12/2016   LDLDIRECT 66.0 08/17/2015   LDLCALC 68 06/12/2016   ALT 13 09/13/2016   AST 14  09/13/2016   NA 137 09/13/2016   K 3.5 09/13/2016   CL 101 09/13/2016   CREATININE 0.55 09/13/2016   BUN 12 09/13/2016   CO2 30 09/13/2016   TSH 0.73 09/13/2016   INR 1.01 05/13/2009   HGBA1C 5.3 08/03/2006    Lab Results  Component Value Date   TSH 0.73 09/13/2016   Lab Results  Component Value Date   WBC 8.6 09/13/2016   HGB 12.5 09/13/2016   HCT 37.9 09/13/2016   MCV 98.6 09/13/2016   PLT 285.0 09/13/2016   Lab Results  Component Value Date   NA 137 09/13/2016   K 3.5 09/13/2016   CO2 30 09/13/2016   GLUCOSE 84 09/13/2016   BUN 12 09/13/2016   CREATININE 0.55 09/13/2016   BILITOT 0.4 09/13/2016   ALKPHOS 38 (L) 09/13/2016   AST 14 09/13/2016   ALT 13 09/13/2016   PROT 6.8 09/13/2016   ALBUMIN 4.3 09/13/2016   CALCIUM 9.5 09/13/2016   ANIONGAP 9 09/26/2014   GFR 112.64 09/13/2016   Lab Results  Component Value Date   CHOL 158 06/12/2016   Lab Results  Component Value Date   HDL 60.30 06/12/2016   Lab Results  Component Value Date   LDLCALC 68 06/12/2016   Lab Results  Component Value Date   TRIG 145.0 06/12/2016   Lab Results  Component Value Date   CHOLHDL 3 06/12/2016   Lab Results  Component Value Date   HGBA1C 5.3 08/03/2006         Assessment & Plan:   Problem List Items Addressed This Visit    Anemia (Chronic)    Increase leafy greens, consider increased lean red meat and using cast iron cookware. Continue to monitor, report any concerns      Relevant Orders   CBC   TSH   Vitamin B 12 deficiency    Check level       Relevant Orders   CBC   Vitamin B12   Anxiety state    Alprazolam prn. Generally doing well.      Relevant Medications   amitriptyline (ELAVIL) 100 MG tablet   ALPRAZolam (XANAX) 1 MG tablet   INSOMNIA    Encouraged good sleep hygiene such as dark, quiet room. No blue/green glowing lights such as computer screens in bedroom. No alcohol or stimulants in evening. Cut down on caffeine as able. Regular  exercise is helpful but not just prior to bed time. Given refill on Ambien to use prn      HSV (herpes simplex virus) infection    Refilled Valtrex today      Relevant Medications   valACYclovir (VALTREX) 500 MG tablet   Hyperlipidemia, mild    Encouraged heart healthy diet, increase exercise, avoid trans fats, consider a krill oil cap daily      Relevant Orders   Lipid panel   TSH   Recurrent UTI    Follows with urology doing well, no changes      Relevant Medications   valACYclovir (VALTREX) 500 MG tablet   Hyponatremia    Check cmp today shows resolved      Relevant Orders   Comprehensive metabolic panel   Preventative health care    Patient encouraged to maintain heart healthy diet, regular exercise, adequate sleep. Consider daily  probiotics. Take medications as prescribed       Other Visit Diagnoses    High risk medication use    -  Primary   Relevant Orders   Pain Mgmt, Profile 8 w/Conf, U      I have changed Renaldo Fiddler. Doyel's valACYclovir, zolpidem, and ALPRAZolam. I am also having her maintain her vitamin E, fluticasone, hydrOXYzine, magnesium oxide, azelastine, montelukast, PX ENTERIC ASPIRIN, cetirizine, levocetirizine, cholecalciferol, albuterol, polyethylene glycol powder, FIRST-DUKES MOUTHWASH, fenofibrate micronized, dicyclomine, diphenoxylate-atropine, benzonatate, doxycycline, CENTRATEX, omeprazole, potassium chloride SA, gabapentin, cyanocobalamin, and amitriptyline.  Meds ordered this encounter  Medications  . valACYclovir (VALTREX) 500 MG tablet    Sig: Take 1 tablet (500 mg total) by mouth as needed.    Dispense:  30 tablet    Refill:  5  . zolpidem (AMBIEN) 5 MG tablet    Sig: Take 1 tablet (5 mg total) by mouth at bedtime.    Dispense:  90 tablet    Refill:  1  . amitriptyline (ELAVIL) 100 MG tablet    Sig: Take 1 tablet (100 mg total) by mouth daily.    Dispense:  90 tablet    Refill:  1  . ALPRAZolam (XANAX) 1 MG tablet    Sig: Take  0.5-1 tablets (0.5-1 mg total) by mouth at bedtime as needed.    Dispense:  90 tablet    Refill:  1    CMA served as Education administrator during this visit. History, Physical and Plan performed by medical provider. Documentation and orders reviewed and attested to.  Penni Homans, MD

## 2017-02-13 NOTE — Assessment & Plan Note (Signed)
Check level 

## 2017-02-13 NOTE — Assessment & Plan Note (Signed)
Refilled Valtrex today. 

## 2017-02-13 NOTE — Assessment & Plan Note (Signed)
Encouraged heart healthy diet, increase exercise, avoid trans fats, consider a krill oil cap daily 

## 2017-02-13 NOTE — Patient Instructions (Signed)
Preventive Care 65 Years and Older, Female Preventive care refers to lifestyle choices and visits with your health care provider that can promote health and wellness. What does preventive care include?  A yearly physical exam. This is also called an annual well check.  Dental exams once or twice a year.  Routine eye exams. Ask your health care provider how often you should have your eyes checked.  Personal lifestyle choices, including: ? Daily care of your teeth and gums. ? Regular physical activity. ? Eating a healthy diet. ? Avoiding tobacco and drug use. ? Limiting alcohol use. ? Practicing safe sex. ? Taking low-dose aspirin every day. ? Taking vitamin and mineral supplements as recommended by your health care provider. What happens during an annual well check? The services and screenings done by your health care provider during your annual well check will depend on your age, overall health, lifestyle risk factors, and family history of disease. Counseling Your health care provider may ask you questions about your:  Alcohol use.  Tobacco use.  Drug use.  Emotional well-being.  Home and relationship well-being.  Sexual activity.  Eating habits.  History of falls.  Memory and ability to understand (cognition).  Work and work environment.  Reproductive health.  Screening You may have the following tests or measurements:  Height, weight, and BMI.  Blood pressure.  Lipid and cholesterol levels. These may be checked every 5 years, or more frequently if you are over 50 years old.  Skin check.  Lung cancer screening. You may have this screening every year starting at age 55 if you have a 30-pack-year history of smoking and currently smoke or have quit within the past 15 years.  Fecal occult blood test (FOBT) of the stool. You may have this test every year starting at age 50.  Flexible sigmoidoscopy or colonoscopy. You may have a sigmoidoscopy every 5 years or  a colonoscopy every 10 years starting at age 50.  Hepatitis C blood test.  Hepatitis B blood test.  Sexually transmitted disease (STD) testing.  Diabetes screening. This is done by checking your blood sugar (glucose) after you have not eaten for a while (fasting). You may have this done every 1-3 years.  Bone density scan. This is done to screen for osteoporosis. You may have this done starting at age 65.  Mammogram. This may be done every 1-2 years. Talk to your health care provider about how often you should have regular mammograms.  Talk with your health care provider about your test results, treatment options, and if necessary, the need for more tests. Vaccines Your health care provider may recommend certain vaccines, such as:  Influenza vaccine. This is recommended every year.  Tetanus, diphtheria, and acellular pertussis (Tdap, Td) vaccine. You may need a Td booster every 10 years.  Varicella vaccine. You may need this if you have not been vaccinated.  Zoster vaccine. You may need this after age 60.  Measles, mumps, and rubella (MMR) vaccine. You may need at least one dose of MMR if you were born in 1957 or later. You may also need a second dose.  Pneumococcal 13-valent conjugate (PCV13) vaccine. One dose is recommended after age 65.  Pneumococcal polysaccharide (PPSV23) vaccine. One dose is recommended after age 65.  Meningococcal vaccine. You may need this if you have certain conditions.  Hepatitis A vaccine. You may need this if you have certain conditions or if you travel or work in places where you may be exposed to hepatitis   A.  Hepatitis B vaccine. You may need this if you have certain conditions or if you travel or work in places where you may be exposed to hepatitis B.  Haemophilus influenzae type b (Hib) vaccine. You may need this if you have certain conditions.  Talk to your health care provider about which screenings and vaccines you need and how often you  need them. This information is not intended to replace advice given to you by your health care provider. Make sure you discuss any questions you have with your health care provider. Document Released: 04/09/2015 Document Revised: 12/01/2015 Document Reviewed: 01/12/2015 Elsevier Interactive Patient Education  2017 [employer].

## 2017-02-13 NOTE — Assessment & Plan Note (Signed)
Increase leafy greens, consider increased lean red meat and using cast iron cookware. Continue to monitor, report any concerns 

## 2017-02-14 MED FILL — VALACYCLOVIR HCL 500 MG TAB: 500 | 30 days supply | Qty: 30 | Fill #0

## 2017-02-16 ENCOUNTER — Other Ambulatory Visit: Payer: Self-pay | Admitting: Family Medicine

## 2017-02-18 NOTE — Assessment & Plan Note (Signed)
Alprazolam prn. Generally doing well.

## 2017-02-18 NOTE — Assessment & Plan Note (Signed)
Patient encouraged to maintain heart healthy diet, regular exercise, adequate sleep. Consider daily probiotics. Take medications as prescribed 

## 2017-02-18 NOTE — Assessment & Plan Note (Signed)
Encouraged good sleep hygiene such as dark, quiet room. No blue/green glowing lights such as computer screens in bedroom. No alcohol or stimulants in evening. Cut down on caffeine as able. Regular exercise is helpful but not just prior to bed time. Given refill on Ambien to use prn

## 2017-02-18 NOTE — Assessment & Plan Note (Signed)
Follows with urology doing well, no changes

## 2017-02-19 MED FILL — POTASSIUM CL ER 20 MEQ TAB: 20 | 90 days supply | Qty: 90 | Fill #0

## 2017-02-20 ENCOUNTER — Other Ambulatory Visit (INDEPENDENT_AMBULATORY_CARE_PROVIDER_SITE_OTHER): Payer: PPO

## 2017-02-20 DIAGNOSIS — E538 Deficiency of other specified B group vitamins: Secondary | ICD-10-CM | POA: Diagnosis not present

## 2017-02-20 DIAGNOSIS — Z79899 Other long term (current) drug therapy: Secondary | ICD-10-CM | POA: Diagnosis not present

## 2017-02-20 DIAGNOSIS — D649 Anemia, unspecified: Secondary | ICD-10-CM | POA: Diagnosis not present

## 2017-02-20 DIAGNOSIS — E871 Hypo-osmolality and hyponatremia: Secondary | ICD-10-CM | POA: Diagnosis not present

## 2017-02-20 DIAGNOSIS — E785 Hyperlipidemia, unspecified: Secondary | ICD-10-CM

## 2017-02-21 LAB — COMPREHENSIVE METABOLIC PANEL
ALT: 13 U/L (ref 0–35)
AST: 14 U/L (ref 0–37)
Albumin: 3.9 g/dL (ref 3.5–5.2)
Alkaline Phosphatase: 43 U/L (ref 39–117)
BUN: 11 mg/dL (ref 6–23)
CO2: 28 mEq/L (ref 19–32)
Calcium: 9.1 mg/dL (ref 8.4–10.5)
Chloride: 99 mEq/L (ref 96–112)
Creatinine, Ser: 0.51 mg/dL (ref 0.40–1.20)
GFR: 122.77 mL/min (ref >60.00)
Glucose, Bld: 76 mg/dL (ref 70–99)
Potassium: 3.7 mEq/L (ref 3.5–5.1)
Sodium: 133 mEq/L — ABNORMAL LOW (ref 135–145)
Total Bilirubin: 0.4 mg/dL (ref 0.2–1.2)
Total Protein: 6.7 g/dL (ref 6.0–8.3)

## 2017-02-21 LAB — CBC
HCT: 35.3 % — ABNORMAL LOW (ref 36.0–46.0)
Hemoglobin: 11.4 g/dL — ABNORMAL LOW (ref 12.0–15.0)
MCHC: 32.2 g/dL (ref 30.0–36.0)
MCV: 98.7 fl (ref 78.0–100.0)
Platelets: 284 10*3/uL (ref 150.0–400.0)
RBC: 3.58 Mil/uL — ABNORMAL LOW (ref 3.87–5.11)
RDW: 13.9 % (ref 11.5–15.5)
WBC: 6.3 10*3/uL (ref 4.0–10.5)

## 2017-02-21 LAB — TSH: TSH: 0.82 u[IU]/mL (ref 0.35–4.50)

## 2017-02-21 LAB — LIPID PANEL
Cholesterol: 160 mg/dL (ref 0–200)
HDL: 54.8 mg/dL (ref >39.00)
LDL Cholesterol: 77 mg/dL (ref 0–99)
NonHDL: 105.15
Total CHOL/HDL Ratio: 3
Triglycerides: 141 mg/dL (ref 0.0–149.0)
VLDL: 28.2 mg/dL (ref 0.0–40.0)

## 2017-02-21 LAB — VITAMIN B12: Vitamin B-12: 489 pg/mL (ref 211–911)

## 2017-02-22 MED FILL — MELOXICAM 7.5 MG TABLET: 7.5 | 30 days supply | Qty: 30 | Fill #1

## 2017-02-24 LAB — PAIN MGMT, PROFILE 8 W/CONF, U
6 Acetylmorphine: NEGATIVE ng/mL (ref <10)
Alcohol Metabolites: NEGATIVE ng/mL (ref <500)
Alphahydroxyalprazolam: NEGATIVE ng/mL (ref <25)
Alphahydroxymidazolam: NEGATIVE ng/mL (ref <50)
Alphahydroxytriazolam: NEGATIVE ng/mL (ref <50)
Aminoclonazepam: NEGATIVE ng/mL (ref <25)
Amphetamines: NEGATIVE ng/mL (ref <500)
Benzodiazepines: NEGATIVE ng/mL (ref <100)
Buprenorphine, Urine: NEGATIVE ng/mL (ref <5)
Cocaine Metabolite: NEGATIVE ng/mL (ref <150)
Creatinine: 13.8 mg/dL — ABNORMAL LOW
Hydroxyethylflurazepam: NEGATIVE ng/mL (ref <50)
Lorazepam: NEGATIVE ng/mL (ref <50)
MDMA: NEGATIVE ng/mL (ref <500)
Marijuana Metabolite: NEGATIVE ng/mL (ref <20)
Nordiazepam: NEGATIVE ng/mL (ref <50)
Opiates: NEGATIVE ng/mL (ref <100)
Oxazepam: NEGATIVE ng/mL (ref <50)
Oxidant: NEGATIVE ug/mL (ref <200)
Oxycodone: NEGATIVE ng/mL (ref <100)
Specific Gravity: 1.004 (ref >=1.0)
Temazepam: NEGATIVE ng/mL (ref <50)
pH: 5.49 (ref 4.5–9.0)

## 2017-02-26 ENCOUNTER — Other Ambulatory Visit: Payer: Self-pay | Admitting: Internal Medicine

## 2017-02-26 ENCOUNTER — Other Ambulatory Visit: Payer: Self-pay | Admitting: Family Medicine

## 2017-02-26 ENCOUNTER — Telehealth: Payer: Self-pay | Admitting: Family Medicine

## 2017-02-26 DIAGNOSIS — I872 Venous insufficiency (chronic) (peripheral): Secondary | ICD-10-CM

## 2017-02-26 NOTE — Telephone Encounter (Signed)
I have placed referral to vascular

## 2017-02-26 NOTE — Telephone Encounter (Signed)
Patient called for lab results. Reviewed results and physician note with her.  Note called for a repeat cmp in one month.

## 2017-02-26 NOTE — Telephone Encounter (Signed)
Please advise 

## 2017-02-26 NOTE — Telephone Encounter (Signed)
Patient called in for lab results. Results and physician note reviewed with patient. Follow-up lab (cmp)scheduled as ordered in result note.   Also, patient requested a vascular referral to Dr. Doren Custard. She stated the referral was for a probable venous ulcer.

## 2017-02-27 MED FILL — CENTRATEX CAPSULE: 106-1 | 30 days supply | Qty: 60 | Fill #0

## 2017-03-01 ENCOUNTER — Telehealth: Payer: Self-pay | Admitting: Family Medicine

## 2017-03-01 ENCOUNTER — Other Ambulatory Visit: Payer: Self-pay

## 2017-03-01 ENCOUNTER — Other Ambulatory Visit: Payer: Self-pay | Admitting: Family Medicine

## 2017-03-01 DIAGNOSIS — K625 Hemorrhage of anus and rectum: Secondary | ICD-10-CM

## 2017-03-01 DIAGNOSIS — D649 Anemia, unspecified: Secondary | ICD-10-CM

## 2017-03-01 DIAGNOSIS — I739 Peripheral vascular disease, unspecified: Secondary | ICD-10-CM

## 2017-03-01 NOTE — Telephone Encounter (Signed)
Please advise 

## 2017-03-01 NOTE — Telephone Encounter (Signed)
Copied from [place]. Topic: Quick Communication - See Telephone Encounter >> Mar 01, 2017  4:08 PM Bea Graff, NT wrote: CRM for notification. See Telephone encounter for: Patient states the vascular dr couldn't get her an appt until 04/09/17 and the pt wants an earlier appt. They told her that Dr. Charlett Blake would have to call them to appeal the drs decision at their office. Dr. Doren Custard on [facility].   03/01/17.

## 2017-03-01 NOTE — Telephone Encounter (Signed)
:   Requesting referral to Dr [facility] for Venous ulcer.    Please advise

## 2017-03-01 NOTE — Telephone Encounter (Signed)
If she is asking for a referral to be switched to another office that is OK that can get sent to referrral coordinator and she can call and switch referral

## 2017-03-01 NOTE — Telephone Encounter (Signed)
I answered this already I said I would have to see the wound to be able to appeal this date because I cannot call for an exception if I cannot report why I think it is necessary.

## 2017-03-06 MED FILL — FLUTICASONE PROP 50 MCG SPR: 50 | 30 days supply | Qty: 16 | Fill #0

## 2017-03-06 MED FILL — LEVOCETIRIZINE 5 MG TABLET: 5 | 90 days supply | Qty: 90 | Fill #0

## 2017-03-07 NOTE — Telephone Encounter (Signed)
Spoke with patient she has appt with PCP on 03/08/17

## 2017-03-08 ENCOUNTER — Ambulatory Visit: Payer: PPO | Admitting: Family Medicine

## 2017-03-08 ENCOUNTER — Ambulatory Visit (HOSPITAL_BASED_OUTPATIENT_CLINIC_OR_DEPARTMENT_OTHER)
Admission: RE | Admit: 2017-03-08 | Discharge: 2017-03-08 | Disposition: A | Discharge status: Home / Self Care | Payer: PPO | Source: Ambulatory Visit | Attending: Family Medicine | Admitting: Family Medicine

## 2017-03-08 ENCOUNTER — Encounter: Payer: Self-pay | Admitting: Family Medicine

## 2017-03-08 VITALS — BP 117/62 | HR 89 | Temp 98.1°F | Resp 16 | Wt 193.8 lb

## 2017-03-08 DIAGNOSIS — F411 Generalized anxiety disorder: Secondary | ICD-10-CM | POA: Diagnosis not present

## 2017-03-08 DIAGNOSIS — M79604 Pain in right leg: Secondary | ICD-10-CM

## 2017-03-08 DIAGNOSIS — Z95828 Presence of other vascular implants and grafts: Secondary | ICD-10-CM

## 2017-03-08 DIAGNOSIS — I82511 Chronic embolism and thrombosis of right femoral vein: Secondary | ICD-10-CM | POA: Insufficient documentation

## 2017-03-08 DIAGNOSIS — I82412 Acute embolism and thrombosis of left femoral vein: Secondary | ICD-10-CM | POA: Diagnosis not present

## 2017-03-08 MED ORDER — CEPHALEXIN 500 MG PO CAPS: 500.0000 mg | ORAL_CAPSULE | Freq: Three times a day (TID) | ORAL | 0 refills | Status: DC

## 2017-03-08 MED FILL — CEPHALEXIN 500 MG CAPSULE: 500 | 10 days supply | Qty: 30 | Fill #0

## 2017-03-08 NOTE — Assessment & Plan Note (Signed)
buning and pain noted along medial aspect of right lower extremity above ankle over the past 2-3 weeks.  She describes the pain is similar to a distant history of DVT roughly 40 years ago.  She had a DVT which required recannulization and ultimately she had a [place] filter placed.  During that time she had recurrent ulcers and is concerned that she will suffer skin breakdown and difficult to heal ulcers again.  Ultrasound confirms a likely chronic thrombus that is only partially occlusive.  She has been set up with vascular surgery and we will consider Xarelto while she awaits her appointment.  There is some mild warmth and redness noted over her medial leg just above her ankle we will start her on a short course of Keflex 500 mg 3 times daily for the possibility of early cellulitis and she will notify us if symptoms worsen.

## 2017-03-08 NOTE — Assessment & Plan Note (Addendum)
Generally doing well but is notably anxious about the discomfort in her leg, may use Alprazolam prn while she awaits treatment

## 2017-03-08 NOTE — Patient Instructions (Signed)

## 2017-03-08 NOTE — Progress Notes (Signed)
Subjective:  I acted as a Education administrator for [employer]. [name], [name]   Patient ID: Alexis Olson, female    DOB: [birth year], 83 y.o.   MRN: [id number]  No chief complaint on file.   HPI  Patient is in today for a visit regarding pan in her right leg.  She describes the sensation as a burning sensation in her right leg above her medial malleolus.  She described it is similar to a sensation she had years ago when she had a deep venous thrombosis in the area and ultimately had recurrent venous stasis ulcers which required wound management to heal.  No recent falls or trauma.  She describes the area as warm and questions whether she has a secondary infection which she is also had in the past.  No chest pain or palpitations no shortness of breath or abdominal pain no other acute complaints noted at today's visit. No fevers, chills.   Patient Care Team: Mosie Lukes, MD as PCP - General (Family Medicine) Rana Snare, MD (Urology) Paula Compton, MD (Obstetrics and Gynecology) Drema Dallas, MD (Physical Medicine and Rehabilitation) Mosetta Anis, MD (Allergy) Suella Broad, MD (Physical Medicine and Rehabilitation) Katy Apo, MD (Ophthalmology)   Past Medical History:  Diagnosis Date  . ALLERGIC RHINITIS   . ANEMIA, CHRONIC    Malabsorption related to bypass hx, B12 and iron deficiency  . ANXIETY   . ASTHMA   . Bariatric surgery status   . Colon perforation ([place]) 08/2014   following polypetomy during colo, surgical repair  . COLONIC POLYPS, ADENOMATOUS, HX OF 2010  . DIVERTICULOSIS, COLON   . GERD (gastroesophageal reflux disease) 08/23/2015  . HSV (herpes simplex virus) infection 08/16/2015  . Hypertension   . Hyponatremia 05/02/2016  . INSOMNIA   . Lumbosacral disc disease    Chronic pain; History of osteomyelitis 2010 following ESI complication  . Obesity   . Recurrent UTI 08/23/2015  . URINARY URGENCY   . VITAMIN B12 DEFICIENCY     Past Surgical History:    Procedure Laterality Date  . [place]   for ruptured disc  . CHOLECYSTECTOMY    . greenfield filter    . ileojejunal bypass  1976   for obesity  . LAPAROTOMY N/A 09/18/2014   Procedure: EXPLORATORY LAPAROTOMY WITH REPAIR OF CECIAL PERFORATION;  Surgeon: Autumn Messing III, MD;  Location: [facility];  Service: General;  Laterality: N/A;  . TONSILLECTOMY    . VASCULAR SURGERY Right    revascularization right lower extremity    Family History  Problem Relation Age of Onset  . Cervical cancer Mother   . Stroke Mother   . Liver disease Sister   . Kidney disease Sister   . Cirrhosis Sister   . Colon cancer Neg Hx   . Esophageal cancer Neg Hx   . Rectal cancer Neg Hx   . Stomach cancer Neg Hx     Social History   Socioeconomic History  . Marital status: Married    Spouse name: Not on file  . Number of children: 3  . Years of education: Not on file  . Highest education level: Not on file  Social Needs  . Financial resource strain: Not on file  . Food insecurity - worry: Not on file  . Food insecurity - inability: Not on file  . Transportation needs - medical: Not on file  . Transportation needs - non-medical: Not on file  Occupational History  . Occupation: retired Therapist, sports  Employer: RETIRED  Tobacco Use  . Smoking status: Never Smoker  . Smokeless tobacco: Never Used  Substance and Sexual Activity  . Alcohol use: No    Alcohol/week: 0.0 oz  . Drug use: No  . Sexual activity: No    Partners: Male  Other Topics Concern  . Not on file  Social History Narrative   Lives with spouse, Indep ADLs   Supportive family nearby   No dietary restrictions    Outpatient Medications Prior to Visit  Medication Sig Dispense Refill  . albuterol (PROVENTIL HFA;VENTOLIN HFA) 108 (90 BASE) MCG/ACT inhaler Inhale 1-2 puffs into the lungs every 6 (six) hours as needed for wheezing or shortness of breath.    . ALPRAZolam (XANAX) 1 MG tablet Take 0.5-1 tablets (0.5-1 mg total) by mouth at  bedtime as needed. 90 tablet 1  . amitriptyline (ELAVIL) 100 MG tablet Take 1 tablet (100 mg total) by mouth daily. 90 tablet 1  . azelastine (OPTIVAR) 0.05 % ophthalmic solution Place 1 drop into both eyes daily as needed (dry eyes).     . benzonatate (TESSALON) 100 MG capsule Take 1-2 capsules (100-200 mg total) by mouth 2 (two) times daily as needed for cough. 30 capsule 0  . cetirizine (ZYRTEC) 10 MG tablet Take 10 mg by mouth daily.     . cholecalciferol (VITAMIN D) 1000 UNITS tablet Take 1,000 Units by mouth daily.    . cyanocobalamin (,VITAMIN B-12,) 1000 MCG/ML injection INJECT 1 ML INTO THE MUSCLE EVERY 30 DAYS. 6 mL 1  . dicyclomine (BENTYL) 10 MG capsule Take 1 capsule (10 mg total) by mouth 3 (three) times daily. 270 capsule 3  . Diphenhyd-Hydrocort-Nystatin (FIRST-DUKES MOUTHWASH) SUSP Take 1 to 2 teaspoonfuls every 4 to 6 hours as directed. 120 mL 2  . diphenoxylate-atropine (LOMOTIL) 2.5-0.025 MG tablet Take 2 tablets by mouth 3 (three) times daily as needed for diarrhea or loose stools. 150 tablet 3  . doxycycline (VIBRA-TABS) 100 MG tablet Take 1 tablet (100 mg total) by mouth 2 (two) times daily. 20 tablet 0  . Fe Fum-FA-B Cmp-C-Zn-Mg-Mn-Cu (CENTRATEX) 106-1 MG CAPS TAKE 1 CAPSULE BY MOUTH 2 TIMES DAILY. 60 each 5  . Fe Fum-FA-B Cmp-C-Zn-Mg-Mn-Cu (HEMOCYTE PLUS) 106-1 MG CAPS TAKE 1 CAPSULE BY MOUTH 2 TIMES DAILY. 60 each 1  . fenofibrate micronized (ANTARA) 130 MG capsule Take 1 capsule (130 mg total) by mouth daily. 90 capsule 0  . fluticasone (FLONASE) 50 MCG/ACT nasal spray 2 sprays by Nasal route daily.      Marland Kitchen gabapentin (NEURONTIN) 300 MG capsule TAKE 1 CAPSULE BY MOUTH AT BEDTIME. 90 capsule 0  . hydrOXYzine (ATARAX) 25 MG tablet Take 25 mg by mouth 3 (three) times daily as needed for itching.     . levocetirizine (XYZAL) 5 MG tablet Take 1 tablet (5 mg total) by mouth every evening. 90 tablet 3  . magnesium oxide (MAG-OX 400) 400 MG tablet Take 400 mg by mouth 2 (two)  times daily.     . montelukast (SINGULAIR) 10 MG tablet Take 10 mg by mouth at bedtime.     Marland Kitchen omeprazole (PRILOSEC) 20 MG capsule TAKE 1 CAPSULE BY MOUTH DAILY. 90 capsule 3  . polyethylene glycol powder (MIRALAX) powder Take 17 g by mouth daily. 255 g 0  . potassium chloride SA (K-DUR,KLOR-CON) 20 MEQ tablet TAKE 1 TABLET BY MOUTH ONCE DAILY 90 tablet 1  . PX ENTERIC ASPIRIN 81 MG EC tablet Take 81 mg by mouth daily.     Marland Kitchen  valACYclovir (VALTREX) 500 MG tablet Take 1 tablet (500 mg total) by mouth as needed. 30 tablet 5  . vitamin E 400 UNIT capsule Take 400 Units by mouth daily.      Marland Kitchen zolpidem (AMBIEN) 5 MG tablet Take 1 tablet (5 mg total) by mouth at bedtime. 90 tablet 1   No facility-administered medications prior to visit.     Allergies  Allergen Reactions  . Other Anaphylaxis    Red peppers--choking  . Azithromycin Other (See Comments)    diarrhea  . Benadryl [Diphenhydramine Hcl] Other (See Comments)    Paradoxical response.  . Ciprofloxacin Hcl     REACTION: causes yeast inf  and refuses to take  . Codeine Phosphate     REACTION: unspecified  . Hydrocodone Itching and Nausea And Vomiting  . Hydrocodone-Homatropine Itching and Nausea And Vomiting    REACTION: unspecified  . Levaquin [Levofloxacin In D5w] Other (See Comments)    Muscle soreness  . Meperidine Hcl   . Sulfa Antibiotics Other (See Comments)    Joint pain  . Ultram [Tramadol Hcl] Other (See Comments)    Can't move joints    Review of Systems  Constitutional: Negative for fever and malaise/fatigue.  HENT: Negative for congestion.   Eyes: Negative for blurred vision.  Respiratory: Negative for shortness of breath.   Cardiovascular: Negative for chest pain, palpitations and leg swelling.  Gastrointestinal: Negative for abdominal pain, blood in stool and nausea.  Genitourinary: Negative for dysuria and frequency.  Musculoskeletal: Positive for myalgias. Negative for falls.  Skin: Negative for rash.    Neurological: Negative for dizziness, loss of consciousness and headaches.  Endo/Heme/Allergies: Negative for environmental allergies.  Psychiatric/Behavioral: Negative for depression. The patient is not nervous/anxious.        Objective:    Physical Exam  Constitutional: She is oriented to person, place, and time. She appears well-developed and well-nourished. No distress.  HENT:  Head: Normocephalic and atraumatic.  Nose: Nose normal.  Eyes: Right eye exhibits no discharge. Left eye exhibits no discharge.  Neck: Normal range of motion. Neck supple.  Cardiovascular: Normal rate and regular rhythm.  No murmur heard. Pulmonary/Chest: Effort normal and breath sounds normal.  Abdominal: Soft. Bowel sounds are normal. There is no tenderness.  Musculoskeletal: She exhibits no edema.  Erythema noted above medial malleolus on right lower extremity. Skin intact but thin. Area warm and perfused.   Neurological: She is alert and oriented to person, place, and time.  Skin: Skin is warm and dry.  Psychiatric: She has a normal mood and affect.  Nursing note and vitals reviewed.   BP 117/62 (BP Location: Left Arm, Patient Position: Sitting, Cuff Size: Normal)   Pulse 89   Temp 98.1 F (36.7 C) (Oral)   Resp 16   Wt 193 lb 12.8 oz (87.9 kg)   SpO2 100%   BMI 32.29 kg/m  Wt Readings from Last 3 Encounters:  03/08/17 193 lb 12.8 oz (87.9 kg)  02/13/17 199 lb (90.3 kg)  05/02/16 189 lb 3.2 oz (85.8 kg)   BP Readings from Last 3 Encounters:  03/08/17 117/62  02/13/17 (!) 148/76  05/02/16 102/62     Immunization History  Administered Date(s) Administered  . Influenza Whole 12/24/2008  . Influenza, High Dose Seasonal PF 12/15/2013, 12/21/2015  . Influenza,inj,Quad PF,6+ Mos 12/27/2012  . Influenza-Unspecified 11/26/2011, 12/21/2014  . Pneumococcal Conjugate-13 02/12/2014  . Pneumococcal Polysaccharide-23 03/27/2005  . Td 02/12/2014  . Zoster 02/06/2012    Health Maintenance  Topic Date Due  . TETANUS/TDAP  02/13/2024  . INFLUENZA VACCINE  Completed  . DEXA SCAN  Completed  . PNA vac Low Risk Adult  Completed    Lab Results  Component Value Date   WBC 6.3 02/20/2017   HGB 11.4 (L) 02/20/2017   HCT 35.3 (L) 02/20/2017   PLT 284.0 02/20/2017   GLUCOSE 76 02/20/2017   CHOL 160 02/20/2017   TRIG 141.0 02/20/2017   HDL 54.80 02/20/2017   LDLDIRECT 66.0 08/17/2015   LDLCALC 77 02/20/2017   ALT 13 02/20/2017   AST 14 02/20/2017   NA 133 (L) 02/20/2017   K 3.7 02/20/2017   CL 99 02/20/2017   CREATININE 0.51 02/20/2017   BUN 11 02/20/2017   CO2 28 02/20/2017   TSH 0.82 02/20/2017   INR 1.01 05/13/2009   HGBA1C 5.3 08/03/2006    Lab Results  Component Value Date   TSH 0.82 02/20/2017   Lab Results  Component Value Date   WBC 6.3 02/20/2017   HGB 11.4 (L) 02/20/2017   HCT 35.3 (L) 02/20/2017   MCV 98.7 02/20/2017   PLT 284.0 02/20/2017   Lab Results  Component Value Date   NA 133 (L) 02/20/2017   K 3.7 02/20/2017   CO2 28 02/20/2017   GLUCOSE 76 02/20/2017   BUN 11 02/20/2017   CREATININE 0.51 02/20/2017   BILITOT 0.4 02/20/2017   ALKPHOS 43 02/20/2017   AST 14 02/20/2017   ALT 13 02/20/2017   PROT 6.7 02/20/2017   ALBUMIN 3.9 02/20/2017   CALCIUM 9.1 02/20/2017   ANIONGAP 9 09/26/2014   GFR 122.77 02/20/2017   Lab Results  Component Value Date   CHOL 160 02/20/2017   Lab Results  Component Value Date   HDL 54.80 02/20/2017   Lab Results  Component Value Date   LDLCALC 77 02/20/2017   Lab Results  Component Value Date   TRIG 141.0 02/20/2017   Lab Results  Component Value Date   CHOLHDL 3 02/20/2017   Lab Results  Component Value Date   HGBA1C 5.3 08/03/2006         Assessment & Plan:   Problem List Items Addressed This Visit    Anxiety state    Generally doing well but is notably anxious about the discomfort in her leg, may use Alprazolam prn while she awaits treatment      Greenfield filter in place    Pain of right lower extremity - Primary    buning and pain noted along medial aspect of right lower extremity above ankle over the past 2-3 weeks.  She describes the pain is similar to a distant history of DVT roughly 40 years ago.  She had a DVT which required recannulization and ultimately she had a Greenfield filter placed.  During that time she had recurrent ulcers and is concerned that she will suffer skin breakdown and difficult to heal ulcers again.  Ultrasound confirms a likely chronic thrombus that is only partially occlusive.  She has been set up with vascular surgery and we will consider Xarelto while she awaits her appointment.  There is some mild warmth and redness noted over her medial leg just above her ankle we will start her on a short course of Keflex 500 mg 3 times daily for the possibility of early cellulitis and she will notify us if symptoms worsen.      Relevant Orders   Ambulatory referral to Vascular Surgery   US Venous Img Lower Unilateral Right (  Completed)      I am having Alexis Olson. Widmayer start on cephALEXin. I am also having her maintain her vitamin E, fluticasone, hydrOXYzine, magnesium oxide, azelastine, montelukast, PX ENTERIC ASPIRIN, cetirizine, levocetirizine, cholecalciferol, albuterol, polyethylene glycol powder, FIRST-DUKES MOUTHWASH, fenofibrate micronized, dicyclomine, diphenoxylate-atropine, benzonatate, doxycycline, CENTRATEX, omeprazole, gabapentin, cyanocobalamin, valACYclovir, zolpidem, amitriptyline, ALPRAZolam, potassium chloride SA, and HEMOCYTE PLUS.  Meds ordered this encounter  Medications  . cephALEXin (KEFLEX) 500 MG capsule    Sig: Take 1 capsule (500 mg total) by mouth 3 (three) times daily.    Dispense:  30 capsule    Refill:  0    CMA served as scribe during this visit. History, Physical and Plan performed by medical provider. Documentation and orders reviewed and attested to.  Penni Homans, MD

## 2017-03-09 ENCOUNTER — Other Ambulatory Visit: Payer: Self-pay | Admitting: Family Medicine

## 2017-03-09 DIAGNOSIS — I825Z1 Chronic embolism and thrombosis of unspecified deep veins of right distal lower extremity: Secondary | ICD-10-CM

## 2017-03-12 ENCOUNTER — Telehealth: Payer: Self-pay

## 2017-03-12 DIAGNOSIS — M170 Bilateral primary osteoarthritis of knee: Secondary | ICD-10-CM | POA: Diagnosis not present

## 2017-03-12 NOTE — Telephone Encounter (Signed)
Spoke with patient and the [facility] office left message for them to call me back if there is an opening.

## 2017-03-12 NOTE — Telephone Encounter (Signed)
I was unable to get the referral moved. Not able to get to the phone today. Cannot call again til next Monday due to their schedule if her wound worsens will call next monday

## 2017-03-12 NOTE — Telephone Encounter (Signed)
Patient called in would like to have appointment that is scheduled on the 14 th with [facility]. Scheduled sooner. Does not want to wait until the 14th.

## 2017-03-22 ENCOUNTER — Other Ambulatory Visit: Payer: Self-pay | Admitting: Family Medicine

## 2017-03-27 MED FILL — OMEPRAZOLE 20 MG CAP: 20 | 90 days supply | Qty: 90 | Fill #3

## 2017-03-28 ENCOUNTER — Other Ambulatory Visit: Payer: PPO

## 2017-03-29 ENCOUNTER — Telehealth: Payer: Self-pay | Admitting: Surgery

## 2017-03-29 ENCOUNTER — Other Ambulatory Visit (INDEPENDENT_AMBULATORY_CARE_PROVIDER_SITE_OTHER): Payer: PPO

## 2017-03-29 DIAGNOSIS — D649 Anemia, unspecified: Secondary | ICD-10-CM | POA: Diagnosis not present

## 2017-03-29 LAB — COMPREHENSIVE METABOLIC PANEL
ALT: 14 U/L (ref 0–35)
AST: 13 U/L (ref 0–37)
Albumin: 3.9 g/dL (ref 3.5–5.2)
Alkaline Phosphatase: 44 U/L (ref 39–117)
BUN: 21 mg/dL (ref 6–23)
CO2: 28 mEq/L (ref 19–32)
Calcium: 8.8 mg/dL (ref 8.4–10.5)
Chloride: 95 mEq/L — ABNORMAL LOW (ref 96–112)
Creatinine, Ser: 0.55 mg/dL (ref 0.40–1.20)
GFR: 112.49 mL/min (ref >60.00)
Glucose, Bld: 76 mg/dL (ref 70–99)
Potassium: 4.5 mEq/L (ref 3.5–5.1)
Sodium: 128 mEq/L — ABNORMAL LOW (ref 135–145)
Total Bilirubin: 0.4 mg/dL (ref 0.2–1.2)
Total Protein: 6.4 g/dL (ref 6.0–8.3)

## 2017-03-30 MED FILL — MELOXICAM 15 MG TABLET: 15 | 30 days supply | Qty: 30 | Fill #0

## 2017-04-02 MED FILL — GABAPENTIN 300 MG CAPSULE: 300 | 90 days supply | Qty: 90 | Fill #0

## 2017-04-02 MED FILL — AMITRIPTYLINE HCL 100 MG TA: 100 | 90 days supply | Qty: 90 | Fill #0

## 2017-04-03 ENCOUNTER — Telehealth: Payer: Self-pay | Admitting: Family Medicine

## 2017-04-03 NOTE — Telephone Encounter (Signed)
Copied from [place]. Topic: Quick Communication - See Telephone Encounter >> Apr 03, 2017  4:23 PM Vernona Rieger wrote: CRM for notification. See Telephone encounter for:   04/03/17.  Patient is requesting lab results from last week. Call back [phone]

## 2017-04-09 ENCOUNTER — Encounter: Payer: Self-pay | Admitting: Surgery

## 2017-04-09 ENCOUNTER — Encounter (HOSPITAL_COMMUNITY): Payer: PPO

## 2017-04-09 ENCOUNTER — Ambulatory Visit: Payer: PPO | Admitting: Surgery

## 2017-04-09 VITALS — BP 131/89 | HR 86 | Resp 18 | Ht 64.0 in | Wt 198.5 lb

## 2017-04-09 DIAGNOSIS — I872 Venous insufficiency (chronic) (peripheral): Secondary | ICD-10-CM

## 2017-04-09 MED FILL — MONTELUKAST SOD 10 MG TAB: 10 | 90 days supply | Qty: 90 | Fill #0

## 2017-04-09 NOTE — Progress Notes (Signed)
[facility] [place]  Patient name: Alexis Olson MRN: [id number] DOB: 1933/08/04 Sex: female   REQUESTING PROVIDER:    Dr. Randel Pigg   REASON FOR CONSULT:    DVT  HISTORY OF PRESENT ILLNESS:   Alexis Olson is a 84 y.o. female, who is a former patient of Dr. Scot Dock.  She has been seen and evaluated for venous stasis ulcers.  She reports having had intestinal surgery approximately 40 years ago.  At that time she developed DVTs and had a vena cava clip placed.  She has had recurrent ulcers in the past, most recently in 2008.  This healed up after approximately 6 months of wound care.  She is very religious about wearing compression stockings.  She showers at night, so that she can put her stockings on before she gets out of bed.  Approximately 1 month ago she developed pain and thinning of the skin around her right ankle.  She was concerned that she may be developing an ulcer.  She was treated with Keflex and shortly thereafter her symptoms began to improve.  She has undergone laser ablation in the past at [facility].  PAST MEDICAL HISTORY    Past Medical History:  Diagnosis Date  . ALLERGIC RHINITIS   . ANEMIA, CHRONIC    Malabsorption related to bypass hx, B12 and iron deficiency  . ANXIETY   . ASTHMA   . Bariatric surgery status   . Colon perforation ([place]) 08/2014   following polypetomy during colo, surgical repair  . COLONIC POLYPS, ADENOMATOUS, HX OF 2010  . DIVERTICULOSIS, COLON   . GERD (gastroesophageal reflux disease) 08/23/2015  . HSV (herpes simplex virus) infection 08/16/2015  . Hypertension   . Hyponatremia 05/02/2016  . INSOMNIA   . Lumbosacral disc disease    Chronic pain; History of osteomyelitis 2010 following ESI complication  . Obesity   . Recurrent UTI 08/23/2015  . URINARY URGENCY   . VITAMIN B12 DEFICIENCY      FAMILY HISTORY   Family History  Problem Relation Age of Onset  . Cervical cancer  Mother   . Stroke Mother   . Liver disease Sister   . Kidney disease Sister   . Cirrhosis Sister   . Colon cancer Neg Hx   . Esophageal cancer Neg Hx   . Rectal cancer Neg Hx   . Stomach cancer Neg Hx     SOCIAL HISTORY:   Social History   Socioeconomic History  . Marital status: Married    Spouse name: Not on file  . Number of children: 3  . Years of education: Not on file  . Highest education level: Not on file  Social Needs  . Financial resource strain: Not on file  . Food insecurity - worry: Not on file  . Food insecurity - inability: Not on file  . Transportation needs - medical: Not on file  . Transportation needs - non-medical: Not on file  Occupational History  . Occupation: retired Programmer, multimedia: RETIRED  Tobacco Use  . Smoking status: Never Smoker  . Smokeless tobacco: Never Used  Substance and Sexual Activity  . Alcohol use: No    Alcohol/week: 0.0 oz  . Drug use: No  . Sexual activity: No    Partners: Male  Other Topics Concern  . Not on file  Social History Narrative   Lives with spouse, Indep ADLs   Supportive family nearby   No dietary restrictions  ALLERGIES:    Allergies  Allergen Reactions  . Other Anaphylaxis    Red peppers--choking  . Azithromycin Other (See Comments)    diarrhea  . Benadryl [Diphenhydramine Hcl] Other (See Comments)    Paradoxical response.  . Ciprofloxacin Hcl     REACTION: causes yeast inf  and refuses to take  . Codeine Phosphate     REACTION: unspecified  . Hydrocodone Itching and Nausea And Vomiting  . Hydrocodone-Homatropine Itching and Nausea And Vomiting    REACTION: unspecified  . Levaquin [Levofloxacin In D5w] Other (See Comments)    Muscle soreness  . Meperidine Hcl   . Sulfa Antibiotics Other (See Comments)    Joint pain  . Ultram [Tramadol Hcl] Other (See Comments)    Can't move joints    CURRENT MEDICATIONS:    Current Outpatient Medications  Medication Sig Dispense Refill  .  albuterol (PROVENTIL HFA;VENTOLIN HFA) 108 (90 BASE) MCG/ACT inhaler Inhale 1-2 puffs into the lungs every 6 (six) hours as needed for wheezing or shortness of breath.    . ALPRAZolam (XANAX) 1 MG tablet Take 0.5-1 tablets (0.5-1 mg total) by mouth at bedtime as needed. 90 tablet 1  . amitriptyline (ELAVIL) 100 MG tablet Take 1 tablet (100 mg total) by mouth daily. 90 tablet 1  . azelastine (OPTIVAR) 0.05 % ophthalmic solution Place 1 drop into both eyes daily as needed (dry eyes).     . benzonatate (TESSALON) 100 MG capsule Take 1-2 capsules (100-200 mg total) by mouth 2 (two) times daily as needed for cough. 30 capsule 0  . cephALEXin (KEFLEX) 500 MG capsule Take 1 capsule (500 mg total) by mouth 3 (three) times daily. 30 capsule 0  . cetirizine (ZYRTEC) 10 MG tablet Take 10 mg by mouth daily.     . cholecalciferol (VITAMIN D) 1000 UNITS tablet Take 1,000 Units by mouth daily.    . cyanocobalamin (,VITAMIN B-12,) 1000 MCG/ML injection INJECT 1 ML INTO THE MUSCLE EVERY 30 DAYS. 6 mL 1  . dicyclomine (BENTYL) 10 MG capsule Take 1 capsule (10 mg total) by mouth 3 (three) times daily. 270 capsule 3  . Diphenhyd-Hydrocort-Nystatin (FIRST-DUKES MOUTHWASH) SUSP Take 1 to 2 teaspoonfuls every 4 to 6 hours as directed. 120 mL 2  . diphenoxylate-atropine (LOMOTIL) 2.5-0.025 MG tablet Take 2 tablets by mouth 3 (three) times daily as needed for diarrhea or loose stools. 150 tablet 3  . doxycycline (VIBRA-TABS) 100 MG tablet Take 1 tablet (100 mg total) by mouth 2 (two) times daily. 20 tablet 0  . Fe Fum-FA-B Cmp-C-Zn-Mg-Mn-Cu (CENTRATEX) 106-1 MG CAPS TAKE 1 CAPSULE BY MOUTH 2 TIMES DAILY. 60 each 5  . Fe Fum-FA-B Cmp-C-Zn-Mg-Mn-Cu (HEMOCYTE PLUS) 106-1 MG CAPS TAKE 1 CAPSULE BY MOUTH 2 TIMES DAILY. 60 each 1  . fenofibrate micronized (ANTARA) 130 MG capsule Take 1 capsule (130 mg total) by mouth daily. 90 capsule 0  . fluticasone (FLONASE) 50 MCG/ACT nasal spray 2 sprays by Nasal route daily.      Marland Kitchen  gabapentin (NEURONTIN) 300 MG capsule TAKE 1 CAPSULE BY MOUTH AT BEDTIME. 90 capsule 0  . hydrOXYzine (ATARAX) 25 MG tablet Take 25 mg by mouth 3 (three) times daily as needed for itching.     . levocetirizine (XYZAL) 5 MG tablet Take 1 tablet (5 mg total) by mouth every evening. 90 tablet 3  . magnesium oxide (MAG-OX 400) 400 MG tablet Take 400 mg by mouth 2 (two) times daily.     . montelukast (SINGULAIR)  10 MG tablet Take 10 mg by mouth at bedtime.     Marland Kitchen omeprazole (PRILOSEC) 20 MG capsule TAKE 1 CAPSULE BY MOUTH DAILY. 90 capsule 3  . polyethylene glycol powder (MIRALAX) powder Take 17 g by mouth daily. 255 g 0  . potassium chloride SA (K-DUR,KLOR-CON) 20 MEQ tablet TAKE 1 TABLET BY MOUTH ONCE DAILY 90 tablet 1  . PX ENTERIC ASPIRIN 81 MG EC tablet Take 81 mg by mouth daily.     . valACYclovir (VALTREX) 500 MG tablet Take 1 tablet (500 mg total) by mouth as needed. 30 tablet 5  . vitamin E 400 UNIT capsule Take 400 Units by mouth daily.      Marland Kitchen zolpidem (AMBIEN) 5 MG tablet Take 1 tablet (5 mg total) by mouth at bedtime. 90 tablet 1   No current facility-administered medications for this visit.     REVIEW OF SYSTEMS:   [X]  denotes positive finding, [ ]  denotes negative finding Cardiac  Comments:  Chest pain or chest pressure:    Shortness of breath upon exertion:    Short of breath when lying flat:    Irregular heart rhythm:        Vascular    Pain in calf, thigh, or hip brought on by ambulation:    Pain in feet at night that wakes you up from your sleep:     Blood clot in your veins: x   Leg swelling:  x       Pulmonary    Oxygen at home:    Productive cough:     Wheezing:         Neurologic    Sudden weakness in arms or legs:     Sudden numbness in arms or legs:     Sudden onset of difficulty speaking or slurred speech:    Temporary loss of vision in one eye:     Problems with dizziness:         Gastrointestinal    Blood in stool:      Vomited blood:           Genitourinary    Burning when urinating:     Blood in urine:        Psychiatric    Major depression:         Hematologic    Bleeding problems:    Problems with blood clotting too easily:        Skin    Rashes or ulcers:        Constitutional    Fever or chills:     PHYSICAL EXAM:   Vitals:   04/09/17 1156  BP: 131/89  Pulse: 86  Resp: 18  SpO2: 98%  Weight: 198 lb 8 oz (90 kg)  Height: 5\' 4"  (1.626 m)    GENERAL: The patient is a well-nourished female, in no acute distress. The vital signs are documented above. CARDIAC: There is a regular rate and rhythm.  VASCULAR: Pitting edema bilaterally.  Faintly palpable pedal pulses. PULMONARY: Nonlabored respirations I have reviewed her most recent venous duplex of the right leg which shows a chronic MUSCULOSKELETAL: There are no major deformities or cyanosis. NEUROLOGIC: No focal weakness or paresthesias are detected. SKIN: There are no ulcers or rashes noted. PSYCHIATRIC: The patient has a normal affect.  STUDIES:   Right femoral DVT  ASSESSMENT and PLAN   Chronic venous insufficiency: I discussed with the patient that I feel she had an episode of superficial thrombophlebitis, versus cellulitis.Marland Kitchen  This did resolve with antibiotics.  I stressed the importance of wearing compression stockings as often as possible.  No intervention is recommended in the leg at this time.  She knows to contact either myself, Dr. Scot Dock, or the wound center immediately should she develop any new open wound.   Annamarie Major, MD [facility] [phone] Pager [phone]

## 2017-04-09 NOTE — Telephone Encounter (Signed)
Called patient and left message for patient to call the office

## 2017-04-11 DIAGNOSIS — H4720 Unspecified optic atrophy: Secondary | ICD-10-CM | POA: Diagnosis not present

## 2017-04-11 DIAGNOSIS — H5213 Myopia, bilateral: Secondary | ICD-10-CM | POA: Diagnosis not present

## 2017-04-11 DIAGNOSIS — Z961 Presence of intraocular lens: Secondary | ICD-10-CM | POA: Diagnosis not present

## 2017-04-13 ENCOUNTER — Encounter (HOSPITAL_COMMUNITY): Payer: PPO

## 2017-04-13 ENCOUNTER — Encounter: Payer: PPO | Admitting: Vascular Surgery

## 2017-04-16 NOTE — Addendum Note (Signed)
Addended by: Magdalene Molly A on: 04/16/2017 01:45 PM   Modules accepted: Orders

## 2017-04-18 ENCOUNTER — Other Ambulatory Visit (INDEPENDENT_AMBULATORY_CARE_PROVIDER_SITE_OTHER): Payer: PPO

## 2017-04-18 DIAGNOSIS — D649 Anemia, unspecified: Secondary | ICD-10-CM | POA: Diagnosis not present

## 2017-04-18 DIAGNOSIS — E871 Hypo-osmolality and hyponatremia: Secondary | ICD-10-CM | POA: Diagnosis not present

## 2017-04-19 LAB — BASIC METABOLIC PANEL
BUN: 20 mg/dL (ref 6–23)
CO2: 29 mEq/L (ref 19–32)
Calcium: 9.1 mg/dL (ref 8.4–10.5)
Chloride: 99 mEq/L (ref 96–112)
Creatinine, Ser: 0.56 mg/dL (ref 0.40–1.20)
GFR: 110.16 mL/min (ref >60.00)
Glucose, Bld: 54 mg/dL — ABNORMAL LOW (ref 70–99)
Potassium: 3.6 mEq/L (ref 3.5–5.1)
Sodium: 135 mEq/L (ref 135–145)

## 2017-04-19 LAB — CBC
HCT: 38.4 % (ref 36.0–46.0)
Hemoglobin: 12.6 g/dL (ref 12.0–15.0)
MCHC: 32.7 g/dL (ref 30.0–36.0)
MCV: 98.4 fl (ref 78.0–100.0)
Platelets: 317 10*3/uL (ref 150.0–400.0)
RBC: 3.9 Mil/uL (ref 3.87–5.11)
RDW: 14.4 % (ref 11.5–15.5)
WBC: 7.3 10*3/uL (ref 4.0–10.5)

## 2017-04-19 NOTE — Addendum Note (Signed)
Addended by: Harl Bowie on: 04/19/2017 01:07 PM   Modules accepted: Orders

## 2017-04-24 MED FILL — ZOLPIDEM TARTRATE 5 MG TABL: 5 | 90 days supply | Qty: 90 | Fill #0

## 2017-04-24 MED FILL — ALPRAZolam 1 MG TABS: 1 | 90 days supply | Qty: 90 | Fill #0

## 2017-04-27 DIAGNOSIS — M179 Osteoarthritis of knee, unspecified: Secondary | ICD-10-CM | POA: Insufficient documentation

## 2017-04-27 DIAGNOSIS — M1710 Unilateral primary osteoarthritis, unspecified knee: Secondary | ICD-10-CM | POA: Insufficient documentation

## 2017-04-27 DIAGNOSIS — M1712 Unilateral primary osteoarthritis, left knee: Secondary | ICD-10-CM | POA: Diagnosis not present

## 2017-04-27 DIAGNOSIS — M13861 Other specified arthritis, right knee: Secondary | ICD-10-CM | POA: Diagnosis not present

## 2017-04-27 MED FILL — MELOXICAM 7.5 MG TABLET: 7.5 | 30 days supply | Qty: 30 | Fill #0

## 2017-05-03 MED FILL — FLUTICASONE PROP 50 MCG SPR: 50 | 30 days supply | Qty: 16 | Fill #0

## 2017-05-04 MED FILL — MELOXICAM 15 MG TABLET: 15 | 30 days supply | Qty: 30 | Fill #1

## 2017-05-17 ENCOUNTER — Ambulatory Visit (HOSPITAL_BASED_OUTPATIENT_CLINIC_OR_DEPARTMENT_OTHER)
Admission: RE | Admit: 2017-05-17 | Discharge: 2017-05-17 | Disposition: A | Discharge status: Home / Self Care | Payer: PPO | Source: Ambulatory Visit | Attending: Medical | Admitting: Medical

## 2017-05-17 ENCOUNTER — Encounter: Payer: Self-pay | Admitting: Medical

## 2017-05-17 ENCOUNTER — Ambulatory Visit (INDEPENDENT_AMBULATORY_CARE_PROVIDER_SITE_OTHER): Payer: PPO | Admitting: Medical

## 2017-05-17 VITALS — BP 108/60 | HR 80 | Temp 97.9°F | Resp 16 | Ht 64.0 in | Wt 200.8 lb

## 2017-05-17 DIAGNOSIS — M8580 Other specified disorders of bone density and structure, unspecified site: Secondary | ICD-10-CM | POA: Diagnosis not present

## 2017-05-17 DIAGNOSIS — M4185 Other forms of scoliosis, thoracolumbar region: Secondary | ICD-10-CM | POA: Insufficient documentation

## 2017-05-17 DIAGNOSIS — G8929 Other chronic pain: Secondary | ICD-10-CM

## 2017-05-17 DIAGNOSIS — M546 Pain in thoracic spine: Secondary | ICD-10-CM

## 2017-05-17 DIAGNOSIS — M47814 Spondylosis without myelopathy or radiculopathy, thoracic region: Secondary | ICD-10-CM | POA: Diagnosis not present

## 2017-05-17 MED ORDER — ACETAMINOPHEN-CODEINE #3 300-30 MG PO TABS: 1.0000 | ORAL_TABLET | Freq: Four times a day (QID) | ORAL | 0 refills | Status: DC | PRN

## 2017-05-17 NOTE — Progress Notes (Signed)
Subjective:    Patient ID: Alexis Olson, female    DOB: [birth year], 84 y.o.   MRN: [id number]  HPI  Pt in reporting some pain in her back. She states for years will have pain in her back. Pain occurs about twice a year. She states this pain occurs for years in same place. Recent pain for 4 days. Over the year pain would last for 2-4 days. No recent fall or trauma.  Pain can be moderate to severe at times and very irritating.  Patient reports no rash on her back.  No blister eruption reported.  Pt has robaxin at home. It has helped some. Pt is on mobic presently.  Pt has side effects/reaction to hydrocodone and tramadol.  In past does well with tylenol with codeine.  She is reassured today of this.  In fact she had a old Tylenol No. 3 tablet from Dr. Nelva Bush most which she used yesterday and had no adverse side effects        Review of Systems  Constitutional: Negative for chills, fatigue and fever.  Respiratory: Negative for cough, chest tightness, shortness of breath and wheezing.   Cardiovascular: Negative for chest pain and palpitations.  Musculoskeletal: Positive for back pain.  Skin: Negative for rash.  Neurological: Negative for dizziness and headaches.  Hematological: Does not bruise/bleed easily.   Past Medical History:  Diagnosis Date  . ALLERGIC RHINITIS   . ANEMIA, CHRONIC    Malabsorption related to bypass hx, B12 and iron deficiency  . ANXIETY   . ASTHMA   . Bariatric surgery status   . Colon perforation ([place]) 08/2014   following polypetomy during colo, surgical repair  . COLONIC POLYPS, ADENOMATOUS, HX OF 2010  . DIVERTICULOSIS, COLON   . GERD (gastroesophageal reflux disease) 08/23/2015  . HSV (herpes simplex virus) infection 08/16/2015  . Hypertension   . Hyponatremia 05/02/2016  . INSOMNIA   . Lumbosacral disc disease    Chronic pain; History of osteomyelitis 2010 following ESI complication  . Obesity   . Recurrent UTI 08/23/2015  . URINARY URGENCY   .  VITAMIN B12 DEFICIENCY      Social History   Socioeconomic History  . Marital status: Married    Spouse name: Not on file  . Number of children: 3  . Years of education: Not on file  . Highest education level: Not on file  Social Needs  . Financial resource strain: Not on file  . Food insecurity - worry: Not on file  . Food insecurity - inability: Not on file  . Transportation needs - medical: Not on file  . Transportation needs - non-medical: Not on file  Occupational History  . Occupation: retired Programmer, multimedia: RETIRED  Tobacco Use  . Smoking status: Never Smoker  . Smokeless tobacco: Never Used  Substance and Sexual Activity  . Alcohol use: No    Alcohol/week: 0.0 oz  . Drug use: No  . Sexual activity: No    Partners: Male  Other Topics Concern  . Not on file  Social History Narrative   Lives with spouse, Indep ADLs   Supportive family nearby   No dietary restrictions    Past Surgical History:  Procedure Laterality Date  . [place]   for ruptured disc  . CHOLECYSTECTOMY    . greenfield filter    . ileojejunal bypass  1976   for obesity  . LAPAROTOMY N/A 09/18/2014   Procedure: EXPLORATORY LAPAROTOMY WITH  REPAIR OF CECIAL PERFORATION;  Surgeon: Autumn Messing III, MD;  Location: [facility];  Service: General;  Laterality: N/A;  . TONSILLECTOMY    . VASCULAR SURGERY Right    revascularization right lower extremity    Family History  Problem Relation Age of Onset  . Cervical cancer Mother   . Stroke Mother   . Liver disease Sister   . Kidney disease Sister   . Cirrhosis Sister   . Colon cancer Neg Hx   . Esophageal cancer Neg Hx   . Rectal cancer Neg Hx   . Stomach cancer Neg Hx     Allergies  Allergen Reactions  . Other Anaphylaxis    Red peppers--choking  . Azithromycin Other (See Comments)    diarrhea  . Benadryl [Diphenhydramine Hcl] Other (See Comments)    Paradoxical response.  . Ciprofloxacin Hcl     REACTION: causes yeast inf  and  refuses to take  . Codeine Phosphate     REACTION: unspecified  . Hydrocodone Itching and Nausea And Vomiting  . Hydrocodone-Homatropine Itching and Nausea And Vomiting    REACTION: unspecified  . Levaquin [Levofloxacin In D5w] Other (See Comments)    Muscle soreness  . Meperidine Hcl   . Sulfa Antibiotics Other (See Comments)    Joint pain  . Ultram [Tramadol Hcl] Other (See Comments)    Can't move joints    Current Outpatient Medications on File Prior to Visit  Medication Sig Dispense Refill  . albuterol (PROVENTIL HFA;VENTOLIN HFA) 108 (90 BASE) MCG/ACT inhaler Inhale 1-2 puffs into the lungs every 6 (six) hours as needed for wheezing or shortness of breath.    . ALPRAZolam (XANAX) 1 MG tablet Take 0.5-1 tablets (0.5-1 mg total) by mouth at bedtime as needed. 90 tablet 1  . amitriptyline (ELAVIL) 100 MG tablet Take 1 tablet (100 mg total) by mouth daily. 90 tablet 1  . azelastine (OPTIVAR) 0.05 % ophthalmic solution Place 1 drop into both eyes daily as needed (dry eyes).     . benzonatate (TESSALON) 100 MG capsule Take 1-2 capsules (100-200 mg total) by mouth 2 (two) times daily as needed for cough. 30 capsule 0  . cephALEXin (KEFLEX) 500 MG capsule Take 1 capsule (500 mg total) by mouth 3 (three) times daily. 30 capsule 0  . cetirizine (ZYRTEC) 10 MG tablet Take 10 mg by mouth daily.     . cholecalciferol (VITAMIN D) 1000 UNITS tablet Take 1,000 Units by mouth daily.    . cyanocobalamin (,VITAMIN B-12,) 1000 MCG/ML injection INJECT 1 ML INTO THE MUSCLE EVERY 30 DAYS. 6 mL 1  . dicyclomine (BENTYL) 10 MG capsule Take 1 capsule (10 mg total) by mouth 3 (three) times daily. 270 capsule 3  . Diphenhyd-Hydrocort-Nystatin (FIRST-DUKES MOUTHWASH) SUSP Take 1 to 2 teaspoonfuls every 4 to 6 hours as directed. 120 mL 2  . diphenoxylate-atropine (LOMOTIL) 2.5-0.025 MG tablet Take 2 tablets by mouth 3 (three) times daily as needed for diarrhea or loose stools. 150 tablet 3  . doxycycline  (VIBRA-TABS) 100 MG tablet Take 1 tablet (100 mg total) by mouth 2 (two) times daily. 20 tablet 0  . Fe Fum-FA-B Cmp-C-Zn-Mg-Mn-Cu (CENTRATEX) 106-1 MG CAPS TAKE 1 CAPSULE BY MOUTH 2 TIMES DAILY. 60 each 5  . Fe Fum-FA-B Cmp-C-Zn-Mg-Mn-Cu (HEMOCYTE PLUS) 106-1 MG CAPS TAKE 1 CAPSULE BY MOUTH 2 TIMES DAILY. 60 each 1  . fenofibrate micronized (ANTARA) 130 MG capsule Take 1 capsule (130 mg total) by mouth daily. 90 capsule 0  .  fluticasone (FLONASE) 50 MCG/ACT nasal spray 2 sprays by Nasal route daily.      Marland Kitchen gabapentin (NEURONTIN) 300 MG capsule TAKE 1 CAPSULE BY MOUTH AT BEDTIME. 90 capsule 0  . hydrOXYzine (ATARAX) 25 MG tablet Take 25 mg by mouth 3 (three) times daily as needed for itching.     . levocetirizine (XYZAL) 5 MG tablet Take 1 tablet (5 mg total) by mouth every evening. 90 tablet 3  . magnesium oxide (MAG-OX 400) 400 MG tablet Take 400 mg by mouth 2 (two) times daily.     . montelukast (SINGULAIR) 10 MG tablet Take 10 mg by mouth at bedtime.     Marland Kitchen omeprazole (PRILOSEC) 20 MG capsule TAKE 1 CAPSULE BY MOUTH DAILY. 90 capsule 3  . polyethylene glycol powder (MIRALAX) powder Take 17 g by mouth daily. 255 g 0  . potassium chloride SA (K-DUR,KLOR-CON) 20 MEQ tablet TAKE 1 TABLET BY MOUTH ONCE DAILY 90 tablet 1  . PX ENTERIC ASPIRIN 81 MG EC tablet Take 81 mg by mouth daily.     . valACYclovir (VALTREX) 500 MG tablet Take 1 tablet (500 mg total) by mouth as needed. 30 tablet 5  . vitamin E 400 UNIT capsule Take 400 Units by mouth daily.      Marland Kitchen zolpidem (AMBIEN) 5 MG tablet Take 1 tablet (5 mg total) by mouth at bedtime. 90 tablet 1   No current facility-administered medications on file prior to visit.     BP 108/60   Pulse 80   Temp 97.9 F (36.6 C) (Oral)   Resp 16   Ht 5\' 4"  (1.626 m)   Wt 200 lb 12.8 oz (91.1 kg)   SpO2 100%   BMI 34.47 kg/m       Objective:   Physical Exam  General- No acute distress. Pleasant patient. Neck- Full range of motion, no jvd Lungs- Clear,  even and unlabored. Heart- regular rate and rhythm. Neurologic- CNII- XII grossly intact.  Back- mid thoracic area pain and in rt parapsinal area. No vesicles or lesions.      Assessment & Plan:  For your chronic intermittent back pain will get xray of your tspine.  For pain recommend continue robaxin and mobic. I am making tylenol with codeine available for more severe pain. You have used this before and state no reaction recently.  Please notify us if pain persists could refer you to sports medicine.  Follow up in 7-10 days or as needed  [employer], [employer]

## 2017-05-17 NOTE — Patient Instructions (Signed)
For your chronic intermittent back pain will get xray of your tspine.  For pain recommend continue robaxin and mobic. I am making tylenol with codeine available for more severe pain. You have used this before and state no reaction recently.  Please notify us if pain persists could refer you to sports medicine.  Follow up in 7-10 days or as needed

## 2017-05-18 MED FILL — ACETAMINOPHEN/COD #3 TABLET: 300-30 | 5 days supply | Qty: 20 | Fill #0

## 2017-05-21 MED FILL — POTASSIUM CL ER 20 MEQ TABL: 20 | 90 days supply | Qty: 90 | Fill #1

## 2017-05-21 MED FILL — CENTRATEX CAPSULE: 106-1 | 30 days supply | Qty: 60 | Fill #1

## 2017-05-22 MED FILL — CYANOCOBALAMIN 1,000 MCG/ML: 1000 | 90 days supply | Qty: 3 | Fill #1

## 2017-05-23 MED FILL — predniSONE 5 MG (21) TBPK: 5 | 6 days supply | Qty: 21 | Fill #0

## 2017-06-12 MED FILL — LEVOCETIRIZINE 5 MG TABLET: 5 | 90 days supply | Qty: 90 | Fill #0

## 2017-06-12 MED FILL — MELOXICAM 15 MG TABLET: 15 | 30 days supply | Qty: 30 | Fill #0

## 2017-06-15 ENCOUNTER — Telehealth: Payer: Self-pay

## 2017-06-15 ENCOUNTER — Other Ambulatory Visit: Payer: PPO

## 2017-06-15 DIAGNOSIS — N390 Urinary tract infection, site not specified: Secondary | ICD-10-CM | POA: Diagnosis not present

## 2017-06-15 NOTE — Addendum Note (Signed)
Addended by: Caffie Pinto on: 06/15/2017 04:34 PM   Modules accepted: Orders

## 2017-06-15 NOTE — Telephone Encounter (Signed)
Copied from [place]. Topic: General - Other >> Jun 12, 2017  3:45 PM Marin Olp L wrote: Reason for CRM: Patient wants to know if she can have a urine test some time next week Monday or Tuesday? She thinks her creatinine is high and prone to UTI. She wants Dr. Randel Pigg to know the antibiotics helped her leg.

## 2017-06-17 ENCOUNTER — Other Ambulatory Visit: Payer: Self-pay | Admitting: Family Medicine

## 2017-06-17 LAB — URINALYSIS
Bilirubin Urine: NEGATIVE
Glucose, UA: NEGATIVE
Ketones, ur: NEGATIVE
Nitrite: POSITIVE — AB
Protein, ur: NEGATIVE
Specific Gravity, Urine: 1.011 (ref 1.001–1.03)
pH: 5.5 (ref 5.0–8.0)

## 2017-06-17 LAB — URINE CULTURE
MICRO NUMBER:: 90363150
SPECIMEN QUALITY:: ADEQUATE

## 2017-06-17 MED ORDER — CEFDINIR 300 MG PO CAPS: 300.0000 mg | ORAL_CAPSULE | Freq: Two times a day (BID) | ORAL | 0 refills | Status: AC

## 2017-06-18 ENCOUNTER — Telehealth: Payer: Self-pay | Admitting: Family Medicine

## 2017-06-18 MED FILL — CEFDINIR 300 MG CAPS: 300 | 7 days supply | Qty: 14 | Fill #0

## 2017-06-18 NOTE — Telephone Encounter (Signed)
Pharmacy  Clarification   [facility]  Prescribed  06/17/2017   Pharmacy [facility]   Outpatient      [phone]

## 2017-06-18 NOTE — Telephone Encounter (Unsigned)
Copied from [place] [id number]. Topic: Quick Communication - Rx Refill/Question >> Jun 18, 2017 10:19 AM Neva Seat wrote: cefdinir (OMNICEF) 300 MG capsule  Pharmacy needs to clarification on dosage and amount to fill pt's Rx.  [place], [place] - [address] [place] [address] Phone: [phone] Fax: [phone]

## 2017-06-18 NOTE — Telephone Encounter (Signed)
Clarified with pharmacy

## 2017-06-29 ENCOUNTER — Other Ambulatory Visit: Payer: Self-pay | Admitting: Family Medicine

## 2017-06-29 ENCOUNTER — Other Ambulatory Visit: Payer: Self-pay | Admitting: Internal Medicine

## 2017-06-29 MED FILL — OMEPRAZOLE 20 MG CAP: 20 | 90 days supply | Qty: 90 | Fill #0

## 2017-06-29 MED FILL — AMITRIPTYLINE HCL 100 MG TA: 100 | 90 days supply | Qty: 90 | Fill #1

## 2017-07-02 MED FILL — MONTELUKAST SOD 10 MG TAB: 10 | 90 days supply | Qty: 90 | Fill #0

## 2017-07-03 ENCOUNTER — Telehealth: Payer: Self-pay | Admitting: Family Medicine

## 2017-07-03 ENCOUNTER — Telehealth: Payer: Self-pay

## 2017-07-03 ENCOUNTER — Other Ambulatory Visit: Payer: PPO

## 2017-07-03 ENCOUNTER — Other Ambulatory Visit: Payer: Self-pay | Admitting: Family Medicine

## 2017-07-03 DIAGNOSIS — N390 Urinary tract infection, site not specified: Secondary | ICD-10-CM

## 2017-07-03 MED ORDER — CEPHALEXIN 500 MG PO CAPS: 500.0000 mg | ORAL_CAPSULE | Freq: Three times a day (TID) | ORAL | 0 refills | Status: DC

## 2017-07-03 MED FILL — GABAPENTIN 300 MG CAPSULE: 300 | 90 days supply | Qty: 90 | Fill #0

## 2017-07-03 NOTE — Telephone Encounter (Signed)
Copied from [place] [id number]. Topic: General - Other >> Jun 28, 2017  3:22 PM Rutherford Nail, NT wrote: Reason for CRM: patient is requesting a call back from [place], she states her insurance instructed her to contact the Engineer, building services. She states she will be home 06/29/17 around 1pm

## 2017-07-03 NOTE — Telephone Encounter (Signed)
Patient came in with spouse today to leave urine sample in an un-sterile container. Lab orders had not been placed. She stated she called yesterday, and told the PEC she wanted to bring her urine sample in. The PEC called into the office and was told to advise the patient she can come in today with her spouse and we will place orders and she can leave a sample whenshe gets here. There was no documentation in the patients chart stating none of this. When

## 2017-07-03 NOTE — Telephone Encounter (Signed)
Left message for patient to return my call.

## 2017-07-04 MED FILL — CEPHALEXIN 500 MG CAPSULE: 500 | 7 days supply | Qty: 21 | Fill #0

## 2017-07-06 LAB — URINE CULTURE
MICRO NUMBER:: 90442191
SPECIMEN QUALITY:: ADEQUATE

## 2017-07-09 MED FILL — DICLOFENAC SODIUM 1% GEL: 1 | 31 days supply | Qty: 500 | Fill #0

## 2017-07-12 MED FILL — MELOXICAM 15 MG TABLET: 15 | 30 days supply | Qty: 30 | Fill #1

## 2017-07-19 NOTE — Telephone Encounter (Addendum)
Relation to OY:OOJZ Call back number: [phone] Pharmacy: [facility], [place]  Reason for call:  Patient states cephALEXin (KEFLEX) 500 MG capsule is not working, patient experiencing burning while urinating, please advise

## 2017-07-20 ENCOUNTER — Other Ambulatory Visit: Payer: Self-pay | Admitting: Internal Medicine

## 2017-07-20 MED ORDER — CEPHALEXIN 500 MG PO CAPS: 500.0000 mg | ORAL_CAPSULE | Freq: Three times a day (TID) | ORAL | 0 refills | Status: DC

## 2017-07-20 MED FILL — ZOLPIDEM TARTRATE 5 MG TAB: 5 | 90 days supply | Qty: 90 | Fill #1

## 2017-07-20 MED FILL — CENTRATEX CAPSULE: 106-1 | 30 days supply | Qty: 60 | Fill #0

## 2017-07-20 MED FILL — CEPHALEXIN 500 MG CAPSULE: 500 | 7 days supply | Qty: 21 | Fill #0

## 2017-07-20 NOTE — Telephone Encounter (Signed)
Patient came in 2 weeks ago and received Keflex for a UTI. Orders was placed and we ran urine.   Patient c/o Uti sx again. Please advise

## 2017-07-20 NOTE — Addendum Note (Signed)
Addended by: Debbrah Alar on: 07/20/2017 05:30 PM   Modules accepted: Orders

## 2017-07-20 NOTE — Telephone Encounter (Signed)
Patient.  She reports some dysuria.  Reports symptoms resolved during and shortly after use of Keflex.  She has multiple drug allergies.  Will send Rx for Keflex again to her pharmacy.  She is advised to come in for appointment if symptoms worsen or fail to improve with this round of Keflex.  Patient verbalizes understanding.

## 2017-08-03 DIAGNOSIS — J4530 Mild persistent asthma, uncomplicated: Secondary | ICD-10-CM | POA: Diagnosis not present

## 2017-08-03 DIAGNOSIS — J3089 Other allergic rhinitis: Secondary | ICD-10-CM | POA: Diagnosis not present

## 2017-08-03 DIAGNOSIS — J301 Allergic rhinitis due to pollen: Secondary | ICD-10-CM | POA: Diagnosis not present

## 2017-08-03 DIAGNOSIS — J209 Acute bronchitis, unspecified: Secondary | ICD-10-CM | POA: Diagnosis not present

## 2017-08-03 MED FILL — hydrOXYzine HCL 25 MG TABS: 25 | 30 days supply | Qty: 60 | Fill #0

## 2017-08-03 MED FILL — ALPRAZolam 1 MG TABS: 1 | 90 days supply | Qty: 90 | Fill #1

## 2017-08-03 MED FILL — predniSONE 10 MG TABS: 10 | 4 days supply | Qty: 10 | Fill #0

## 2017-08-03 MED FILL — FLUTICASONE PROP 50 MCG SPR: 50 | 30 days supply | Qty: 16 | Fill #0

## 2017-08-03 MED FILL — DOXYCYCLINE HYCLATE 100 MG: 100 | 10 days supply | Qty: 11 | Fill #0

## 2017-08-06 ENCOUNTER — Other Ambulatory Visit: Payer: Self-pay | Admitting: Family Medicine

## 2017-08-06 ENCOUNTER — Ambulatory Visit
Admission: RE | Admit: 2017-08-06 | Discharge: 2017-08-06 | Disposition: A | Discharge status: Home / Self Care | Payer: PPO | Source: Ambulatory Visit | Attending: Family Medicine | Admitting: Family Medicine

## 2017-08-06 DIAGNOSIS — M1990 Unspecified osteoarthritis, unspecified site: Secondary | ICD-10-CM

## 2017-08-06 DIAGNOSIS — M25561 Pain in right knee: Secondary | ICD-10-CM

## 2017-08-06 DIAGNOSIS — M48061 Spinal stenosis, lumbar region without neurogenic claudication: Secondary | ICD-10-CM | POA: Diagnosis not present

## 2017-08-06 DIAGNOSIS — M25562 Pain in left knee: Principal | ICD-10-CM

## 2017-08-07 DIAGNOSIS — M1711 Unilateral primary osteoarthritis, right knee: Secondary | ICD-10-CM | POA: Diagnosis not present

## 2017-08-07 DIAGNOSIS — M1712 Unilateral primary osteoarthritis, left knee: Secondary | ICD-10-CM | POA: Diagnosis not present

## 2017-08-10 MED FILL — MELOXICAM 15 MG TABLET: 15 | 30 days supply | Qty: 30 | Fill #0

## 2017-08-17 ENCOUNTER — Other Ambulatory Visit: Payer: Self-pay | Admitting: Family Medicine

## 2017-08-17 MED FILL — POTASSIUM CL ER 20 MEQ TABL: 20 | 90 days supply | Qty: 90 | Fill #0

## 2017-08-21 ENCOUNTER — Telehealth: Payer: Self-pay | Admitting: Family Medicine

## 2017-08-21 DIAGNOSIS — I10 Essential (primary) hypertension: Secondary | ICD-10-CM

## 2017-08-21 NOTE — Telephone Encounter (Signed)
Copied from [place] [id number]. Topic: Quick Communication - See Telephone Encounter >> Aug 21, 2017  5:55 PM Neva Seat wrote: Pt needing to have hemoglobin checked.  Please call pt to let her know when the lab orders are in.

## 2017-08-22 NOTE — Telephone Encounter (Signed)
Are you ok with this? 

## 2017-08-22 NOTE — Telephone Encounter (Signed)
Yes, OK to order CBC for Htn. thanks

## 2017-08-23 NOTE — Addendum Note (Signed)
Addended by: Kem Boroughs D on: 08/23/2017 01:45 PM   Modules accepted: Orders

## 2017-08-23 NOTE — Telephone Encounter (Signed)
Patient notified and order placed and appt made.

## 2017-08-24 ENCOUNTER — Other Ambulatory Visit (INDEPENDENT_AMBULATORY_CARE_PROVIDER_SITE_OTHER): Payer: PPO

## 2017-08-24 ENCOUNTER — Other Ambulatory Visit: Payer: PPO

## 2017-08-24 DIAGNOSIS — I10 Essential (primary) hypertension: Secondary | ICD-10-CM

## 2017-08-24 LAB — CBC
HCT: 38.7 % (ref 35.0–45.0)
Hemoglobin: 13 g/dL (ref 11.7–15.5)
MCH: 31.6 pg (ref 27.0–33.0)
MCHC: 33.6 g/dL (ref 32.0–36.0)
MCV: 94.2 fL (ref 80.0–100.0)
MPV: 9 fL (ref 7.5–12.5)
Platelets: 238 10*3/uL (ref 140–400)
RBC: 4.11 10*6/uL (ref 3.80–5.10)
RDW: 11.9 % (ref 11.0–15.0)
WBC: 8.3 10*3/uL (ref 3.8–10.8)

## 2017-08-24 NOTE — Addendum Note (Signed)
Addended by: Caffie Pinto on: 08/24/2017 04:18 PM   Modules accepted: Orders

## 2017-08-28 ENCOUNTER — Emergency Department (HOSPITAL_COMMUNITY): Payer: PPO

## 2017-08-28 ENCOUNTER — Emergency Department (HOSPITAL_COMMUNITY)
Admission: EM | Admit: 2017-08-28 | Discharge: 2017-08-28 | Disposition: A | Discharge status: Home / Self Care | Payer: PPO | Attending: Emergency Medicine | Admitting: Emergency Medicine

## 2017-08-28 DIAGNOSIS — W19XXXA Unspecified fall, initial encounter: Secondary | ICD-10-CM | POA: Insufficient documentation

## 2017-08-28 DIAGNOSIS — M25552 Pain in left hip: Secondary | ICD-10-CM | POA: Diagnosis not present

## 2017-08-28 DIAGNOSIS — I10 Essential (primary) hypertension: Secondary | ICD-10-CM | POA: Diagnosis not present

## 2017-08-28 DIAGNOSIS — J45909 Unspecified asthma, uncomplicated: Secondary | ICD-10-CM | POA: Diagnosis not present

## 2017-08-28 DIAGNOSIS — Y929 Unspecified place or not applicable: Secondary | ICD-10-CM | POA: Insufficient documentation

## 2017-08-28 DIAGNOSIS — S329XXA Fracture of unspecified parts of lumbosacral spine and pelvis, initial encounter for closed fracture: Secondary | ICD-10-CM | POA: Insufficient documentation

## 2017-08-28 DIAGNOSIS — S32502A Unspecified fracture of left pubis, initial encounter for closed fracture: Secondary | ICD-10-CM | POA: Diagnosis not present

## 2017-08-28 DIAGNOSIS — Y939 Activity, unspecified: Secondary | ICD-10-CM | POA: Insufficient documentation

## 2017-08-28 DIAGNOSIS — Y999 Unspecified external cause status: Secondary | ICD-10-CM | POA: Diagnosis not present

## 2017-08-28 DIAGNOSIS — S79912A Unspecified injury of left hip, initial encounter: Secondary | ICD-10-CM | POA: Diagnosis not present

## 2017-08-28 MED ORDER — FENTANYL CITRATE (PF) 100 MCG/2ML IJ SOLN
50.0000 ug | Freq: Once | INTRAMUSCULAR | Status: DC
  Filled 2017-08-28: qty 2

## 2017-08-28 MED ORDER — FENTANYL CITRATE (PF) 100 MCG/2ML IJ SOLN
50.0000 ug | Freq: Once | INTRAMUSCULAR | Status: AC
  Administered 2017-08-28: 50 ug via INTRAMUSCULAR
  Filled 2017-08-28: qty 2

## 2017-08-28 MED ORDER — ACETAMINOPHEN-CODEINE #3 300-30 MG PO TABS
1.0000 | ORAL_TABLET | Freq: Once | ORAL | Status: AC
  Administered 2017-08-28: 1 via ORAL
  Filled 2017-08-28: qty 1

## 2017-08-28 MED ORDER — ACETAMINOPHEN-CODEINE #3 300-30 MG PO TABS: 1.0000 | ORAL_TABLET | ORAL | 0 refills | Status: DC | PRN

## 2017-08-28 NOTE — ED Provider Notes (Signed)
Pt signed out to me by L Layden, PA-C. Please see previous notes for further history.   In brief, pt had a mechanical fall yesterday, landed on her L hip. She denies hitting her head or LOC. No neck or back pain. Having continued pain of L hip, difficulty ambulating due to pain. At baseline, walks without difficulty or assistance device. Neurovascularly intact. Hip xray negative. Pain with passive internal and external ROM. No shortening or rotation. CT hip pending. Plan for d/c with pain medication (and maybe a walker) if ct negative.   CT shows pelvis fx. Pt given tylenol 3's and ambulated with walker without difficulty. Discussed plan for d/c home and f/u with orthopedics. Pt states she has walker at home. At this time, pt appears safe for d/c. Return precautions given. Pt states she understands and agrees to plan.    Franchot Heidelberg, PA-C 08/29/17 0139    Little, Wenda Overland, MD 08/29/17 1434

## 2017-08-28 NOTE — ED Provider Notes (Signed)
[facility] EMERGENCY DEPARTMENT Provider Note   CSN: [id number] Arrival date & time: 08/28/17  1254     History   Chief Complaint Chief Complaint  Patient presents with  . Hip Pain    HPI Alexis Olson is a 84 y.o. female past medical history of anemia, asthma, diverticulosis who presents for evaluation of left hip pain that began yesterday after mechanical fall.  Patient reports that she was moving some blinds yesterday and went to turn around.  Patient reports when she did, she fell and landed on her left side.  She states she did not hit her head or have any loss of consciousness.  She denies any preceding chest pain or dizziness.  Patient reports that she had to have assistance getting up from her husband.  Patient reports that since then, she has had pain to the left hip.  Patient reports that she has had worsening pain with attempting to ambulate or bear weight.  Patient reports that she has been able to get around but has had to limp and not put her full weight on her left lower extremity.  Patient reports that she took Robaxin for the pain prior to ED arrival.  Patient denies any neck pain, back pain, chest pain, difficulty breathing, dizziness, numbness/weakness.  The history is provided by the patient.    Past Medical History:  Diagnosis Date  . ALLERGIC RHINITIS   . ANEMIA, CHRONIC    Malabsorption related to bypass hx, B12 and iron deficiency  . ANXIETY   . ASTHMA   . Bariatric surgery status   . Colon perforation ([place]) 08/2014   following polypetomy during colo, surgical repair  . COLONIC POLYPS, ADENOMATOUS, HX OF 2010  . DIVERTICULOSIS, COLON   . GERD (gastroesophageal reflux disease) 08/23/2015  . HSV (herpes simplex virus) infection 08/16/2015  . Hypertension   . Hyponatremia 05/02/2016  . INSOMNIA   . Lumbosacral disc disease    Chronic pain; History of osteomyelitis 2010 following ESI complication  . Obesity   . Recurrent UTI 08/23/2015  .  URINARY URGENCY   . VITAMIN B12 DEFICIENCY     Patient Active Problem List   Diagnosis Date Noted  . [place] filter in place 03/08/2017  . Pain of right lower extremity 03/08/2017  . Preventative health care 02/13/2017  . Hyponatremia 05/02/2016  . Recurrent UTI 08/23/2015  . GERD (gastroesophageal reflux disease) 08/23/2015  . HSV (herpes simplex virus) infection 08/16/2015  . Hyperlipidemia, mild 08/16/2015  . Osteopenia, senile   . Chronic diarrhea   . Anemia   . OSTEOMYELITIS, VERTEBRA 05/05/2009  . [place] DISEASE, LUMBOSACRAL SPINE 04/22/2009  . Vitamin B 12 deficiency 07/09/2008  . DIVERTICULOSIS, COLON 07/09/2008  . COLONIC POLYPS, ADENOMATOUS, HX OF 07/09/2008  . Anxiety state 08/08/2007  . INSOMNIA 08/08/2007  . Bariatric surgery status 08/08/2007  . ALLERGIC RHINITIS 09/18/2006    Past Surgical History:  Procedure Laterality Date  . [place]   for ruptured disc  . CHOLECYSTECTOMY    . greenfield filter    . ileojejunal bypass  1976   for obesity  . LAPAROTOMY N/A 09/18/2014   Procedure: EXPLORATORY LAPAROTOMY WITH REPAIR OF CECIAL PERFORATION;  Surgeon: Autumn Messing III, MD;  Location: [facility];  Service: General;  Laterality: N/A;  . TONSILLECTOMY    . VASCULAR SURGERY Right    revascularization right lower extremity     OB History   None      Home  Medications    Prior to Admission medications   Medication Sig Start Date End Date Taking? Authorizing Provider  acetaminophen-codeine (TYLENOL #3) 300-30 MG tablet Take 1 tablet by mouth every 6 (six) hours as needed for moderate pain. 05/17/17  Yes Saguier, Percell Miller, PA-C  albuterol (PROVENTIL HFA;VENTOLIN HFA) 108 (90 BASE) MCG/ACT inhaler Inhale 1-2 puffs into the lungs every 6 (six) hours as needed for wheezing or shortness of breath.   Yes [provider]  ALPRAZolam Duanne Moron) 1 MG tablet Take 0.5-1 tablets (0.5-1 mg total) by mouth at bedtime as needed. Patient taking differently: Take 0.5-1  mg by mouth at bedtime as needed (for sleep or anxiety).  02/13/17  Yes Mosie Lukes, MD  amitriptyline (ELAVIL) 100 MG tablet Take 1 tablet (100 mg total) by mouth daily. Patient taking differently: Take 100 mg by mouth at bedtime.  02/13/17  Yes Mosie Lukes, MD  azelastine (OPTIVAR) 0.05 % ophthalmic solution Place 1 drop into both eyes daily as needed (for dry eyes).    Yes [provider]  budesonide-formoterol (SYMBICORT) 160-4.5 MCG/ACT inhaler Inhale 2 puffs into the lungs See admin instructions. Inhale 2 puffs into the lungs one to two times a day   Yes [provider]  cetirizine (ZYRTEC) 10 MG tablet Take 10 mg by mouth daily.  02/05/14  Yes [provider]  cholecalciferol (VITAMIN D) 1000 UNITS tablet Take 1,000 Units by mouth daily.   Yes [provider]  cyanocobalamin (,VITAMIN B-12,) 1000 MCG/ML injection INJECT 1 ML INTO THE MUSCLE EVERY 30 DAYS. 01/11/17  Yes Gatha Mayer, MD  diclofenac sodium (VOLTAREN) 1 % GEL Apply 2-4 g topically See admin instructions. Apply 2-4 grams to both knees once a day 07/09/17  Yes [provider]  dicyclomine (BENTYL) 10 MG capsule Take 1 capsule (10 mg total) by mouth 3 (three) times daily. Patient taking differently: Take 10 mg by mouth daily as needed (for diarrhea & symptoms).  02/25/16  Yes Gatha Mayer, MD  diphenoxylate-atropine (LOMOTIL) 2.5-0.025 MG tablet Take 2 tablets by mouth 3 (three) times daily as needed for diarrhea or loose stools. 02/25/16  Yes Gatha Mayer, MD  Fe Fum-FA-B Cmp-C-Zn-Mg-Mn-Cu (CENTRATEX) 106-1 MG CAPS TAKE 1 CAPSULE BY MOUTH 2 TIMES DAILY. Patient taking differently: Take 1 capsule by mouth at bedtime 05/29/16  Yes Gatha Mayer, MD  fluticasone [facility]) 50 MCG/ACT nasal spray 2 sprays by Nasal route daily.     Yes [provider]  gabapentin (NEURONTIN) 300 MG capsule TAKE 1 CAPSULE BY MOUTH AT BEDTIME. 07/03/17  Yes Mosie Lukes, MD  hydrOXYzine  (ATARAX) 25 MG tablet Take 25 mg by mouth 3 (three) times daily as needed for itching.    Yes [provider]  levocetirizine (XYZAL) 5 MG tablet Take 1 tablet (5 mg total) by mouth every evening. 08/04/14  Yes Rowe Clack, MD  magnesium oxide (MAG-OX 400) 400 MG tablet Take 400 mg by mouth 2 (two) times daily.    Yes [provider]  meloxicam (MOBIC) 15 MG tablet Take 15 mg by mouth daily. 08/10/17  Yes [provider]  montelukast (SINGULAIR) 10 MG tablet Take 10 mg by mouth at bedtime.  02/11/14  Yes [provider]  omeprazole (PRILOSEC) 20 MG capsule TAKE 1 CAPSULE BY MOUTH DAILY. Patient taking differently: Take 20 mg by mouth at bedtime 06/29/17  Yes Gatha Mayer, MD  polyethylene glycol powder (MIRALAX) powder Take 17 g by  mouth daily. Patient taking differently: Take 17 g by mouth daily as needed for mild constipation.  12/08/15  Yes Gatha Mayer, MD  potassium chloride SA (K-DUR,KLOR-CON) 20 MEQ tablet TAKE 1 TABLET BY MOUTH ONCE DAILY 08/17/17  Yes Mosie Lukes, MD  PX ENTERIC ASPIRIN 81 MG EC tablet Take 81 mg by mouth 2 (two) times a week.  02/05/14  Yes [provider]  valACYclovir (VALTREX) 500 MG tablet Take 1 tablet (500 mg total) by mouth as needed. Patient taking differently: Take 500 mg by mouth daily as needed (for flares of fever blisters).  02/13/17  Yes Mosie Lukes, MD  vitamin E 400 UNIT capsule Take 400 Units by mouth daily.     Yes [provider]  zolpidem (AMBIEN) 5 MG tablet Take 1 tablet (5 mg total) by mouth at bedtime. 02/13/17  Yes Mosie Lukes, MD  benzonatate (TESSALON) 100 MG capsule Take 1-2 capsules (100-200 mg total) by mouth 2 (two) times daily as needed for cough. Patient not taking: Reported on 08/28/2017 05/02/16   Mosie Lukes, MD  cephALEXin (KEFLEX) 500 MG capsule Take 1 capsule (500 mg total) by mouth 3 (three) times daily. Patient not taking: Reported on 08/28/2017 07/20/17    Debbrah Alar, NP  Diphenhyd-Hydrocort-Nystatin (FIRST-DUKES MOUTHWASH) SUSP Take 1 to 2 teaspoonfuls every 4 to 6 hours as directed. Patient not taking: Reported on 08/28/2017 02/21/16   Mosie Lukes, MD  doxycycline (VIBRA-TABS) 100 MG tablet Take 1 tablet (100 mg total) by mouth 2 (two) times daily. Patient not taking: Reported on 08/28/2017 05/02/16   Mosie Lukes, MD  Fe Fum-FA-B Cmp-C-Zn-Mg-Mn-Cu (CENTRATEX) 106-1 MG CAPS TAKE 1 CAPSULE BY MOUTH 2 TIMES DAILY. Patient not taking: Reported on 08/28/2017 07/20/17   Gatha Mayer, MD  fenofibrate micronized (ANTARA) 130 MG capsule Take 1 capsule (130 mg total) by mouth daily. Patient not taking: Reported on 08/28/2017 02/24/16   Mosie Lukes, MD    Family History Family History  Problem Relation Age of Onset  . Cervical cancer Mother   . Stroke Mother   . Liver disease Sister   . Kidney disease Sister   . Cirrhosis Sister   . Colon cancer Neg Hx   . Esophageal cancer Neg Hx   . Rectal cancer Neg Hx   . Stomach cancer Neg Hx     Social History Social History   Tobacco Use  . Smoking status: Never Smoker  . Smokeless tobacco: Never Used  Substance Use Topics  . Alcohol use: No    Alcohol/week: 0.0 oz  . Drug use: No     Allergies   Ativan [lorazepam]; Other; Azithromycin; Benadryl [diphenhydramine hcl]; Ciprofloxacin hcl; Codeine phosphate; Hydrocodone; Hydrocodone-homatropine; Levaquin [levofloxacin in d5w]; Meperidine hcl; Sulfa antibiotics; Tape; and Ultram [tramadol hcl]   Review of Systems Review of Systems  Constitutional: Negative for fever.  Respiratory: Negative for cough and shortness of breath.   Cardiovascular: Negative for chest pain.  Gastrointestinal: Negative for abdominal pain, nausea and vomiting.  Musculoskeletal:       Left hip pain  Neurological: Negative for weakness, numbness and headaches.  All other systems reviewed and are negative.    Physical Exam Updated Vital Signs BP  119/64   Pulse 86   Temp 98 F (36.7 C) (Oral)   Resp 18   SpO2 99%   Physical Exam  Constitutional: She is oriented to person, place, and time. She appears well-developed and well-nourished.  HENT:  Head: Normocephalic and atraumatic.  Mouth/Throat: Oropharynx is clear and moist and mucous membranes are normal.  No tenderness to palpation of skull. No deformities or crepitus noted. No open wounds, abrasions or lacerations.   Eyes: Pupils are equal, round, and reactive to light. Conjunctivae, EOM and lids are normal.  Neck: Full passive range of motion without pain.  Full flexion/extension and lateral movement of neck fully intact. No bony midline tenderness. No deformities or crepitus.   Cardiovascular: Normal rate, regular rhythm, normal heart sounds and normal pulses. Exam reveals no gallop and no friction rub.  No murmur heard. Pulmonary/Chest: Effort normal and breath sounds normal.  Abdominal: Soft. Normal appearance. There is no tenderness. There is no rigidity and no guarding.  Musculoskeletal: Normal range of motion.       Thoracic back: She exhibits no tenderness.       Lumbar back: She exhibits no tenderness.       Legs: Tenderness palpation noted to the anterior left hip that extends into the left groin.  No deformity or crepitus noted.  No overlying soft tissue swelling, erythema.  No tenderness palpation noted to left femur, left knee, left tib-fib, left ankle, left foot.  Flexion/extension of left lower extremity intact without any difficulty.  Subjective reports of pain in the left hip with internal and external rotation.  No evidence of shortening or rotation of lower extremities.  No abnormalities of the right lower extremity.  Neurological: She is alert and oriented to person, place, and time.  Sensation intact along major nerve distributions of BLE  Skin: Skin is warm and dry. Capillary refill takes less than 2 seconds.  Psychiatric: She has a normal mood and affect.  Her speech is normal.  Nursing note and vitals reviewed.    ED Treatments / Results  Labs (all labs ordered are listed, but only abnormal results are displayed) Labs Reviewed - No data to display  EKG None  Radiology Ct Pelvis Wo Contrast  Result Date: 08/28/2017 CLINICAL DATA:  Post fall in living room last night. Right groin and left hip pain. Subsequent encounter. EXAM: CT PELVIS WITHOUT CONTRAST TECHNIQUE: Multidetector CT imaging of the pelvis was performed following the standard protocol without intravenous contrast. COMPARISON:  Pelvic plain film examination 08/28/2017. 12/01/2015 CT. FINDINGS: Urinary Tract: Inferior aspect right kidney and bladder unremarkable. Bowel: No acute bowel abnormality. Diastasis anterior lower abdominal and pelvic wall with broad-based bowel containing hernia. Vascular/Lymphatic: Vascular calcifications. Scattered normal size lymph nodes. Reproductive:  No worrisome uterine or adnexal mass. Other:  No free air. Musculoskeletal: Left pubic body fracture. Degenerative changes L3-4, L4-5 and L5-S1 with Schmorl's node deformity superior endplate L4. Spinal stenosis L3-4 through L5-S1. IMPRESSION: Fracture left pubic body.  No other pelvic or hip fracture noted. Degenerative changes lumbar spine with spinal stenosis L3-4 through L5-S1. Aortic Atherosclerosis (ICD10-I70.0). Electronically Signed   By: Genia Del M.D.   On: 08/28/2017 15:47   Dg Hip Unilat With Pelvis 2-3 Views Left  Result Date: 08/28/2017 CLINICAL DATA:  Left hip pain after fall. EXAM: DG HIP (WITH OR WITHOUT PELVIS) 2-3V LEFT COMPARISON:  None. FINDINGS: There is no evidence of hip fracture or dislocation. There is no evidence of arthropathy or other focal bone abnormality. IMPRESSION: No significant abnormality seen in the left hip. Electronically Signed   By: Marijo Conception, M.D.   On: 08/28/2017 14:48    Procedures Procedures (including critical care time)  Medications Ordered in  ED Medications  acetaminophen-codeine (TYLENOL #3) 300-30 MG per tablet 1 tablet (has no administration in time range)  fentaNYL (SUBLIMAZE) injection 50 mcg (50 mcg Intramuscular Given 08/28/17 1359)     Initial Impression / Assessment and Plan / ED Course  I have reviewed the triage vital signs and the nursing notes.  Pertinent labs & imaging results that were available during my care of the patient were reviewed by me and considered in my medical decision making (see chart for details).     84 year old female who presents for evaluation of left hip pain after mechanical fall that occurred yesterday.  Reports it is worse with ambulating bearing weight though she has been able to with some difficulty.  No numbness/weakness. Patient is afebrile, non-toxic appearing, sitting comfortably on examination table. Vital signs reviewed and stable. Patient is neurovascularly intact.  Tenderness palpation noted to the anterior aspect of the left hip that extends into the groin.  Normal flexion/extension.  Pain with internal and external rotation of the hip.  Consider strain versus fracture.  X-rays ordered at triage.  X-ray reviewed.  Negative for any acute fracture dislocation.  We will plan to try to ambulate patient.  Attempted to ambulate patient to the bathroom.  She was unable to walk without assistance and had severe pain when attempting to bear weight on the hip.  Given inability to bear weight and walk, will plan for CT pelvis without contrast for evaluation of occult fracture.  Patient signed out to [facility], PA-C with CT pelvis pending. Please see her note for further ED course.   Final Clinical Impressions(s) / ED Diagnoses   Final diagnoses:  Closed nondisplaced fracture of pelvis, unspecified part of pelvis, initial encounter [facility])    ED Discharge Orders    None       Desma Mcgregor 08/28/17 1635    Quintella Reichert, MD 08/31/17 [phone]

## 2017-08-28 NOTE — ED Notes (Signed)
Pt ambulated with walker.

## 2017-08-28 NOTE — ED Notes (Addendum)
Attempted to walk pt to bathroom  Per Mendel Ryder PA ; pt needed moderate assistance to ambulate ; pt  states " my pain feels a little better, but I want a wheelchair because it is still painful to walk" pt walked about 10 steps and then needed wheelchair to ambulate the rest of the way to the bathroom; PA  Mendel Ryder aware

## 2017-08-28 NOTE — ED Notes (Signed)
Patient transported to CT 

## 2017-08-28 NOTE — ED Triage Notes (Signed)
Pt arrives from home with husband after having a fall in her living room last night. Pt landed on her right side and is having pain in right groin area, with increased pain while walking. Pt denies hitting her head or any LOC.

## 2017-08-28 NOTE — Discharge Instructions (Addendum)
Use either Tylenol or Tylenol 3's to help with pain control. Use a walker when ambulating until you are no longer taking pain medication. Use ice to help with pain and swelling. Follow-up with orthopedics for further evaluation and management. Return to the emergency room if you develop loss of bowel or bladder control, numbness, or any new or concerning symptoms.

## 2017-09-03 DIAGNOSIS — B350 Tinea barbae and tinea capitis: Secondary | ICD-10-CM | POA: Diagnosis not present

## 2017-09-03 DIAGNOSIS — D485 Neoplasm of uncertain behavior of skin: Secondary | ICD-10-CM | POA: Diagnosis not present

## 2017-09-03 DIAGNOSIS — L738 Other specified follicular disorders: Secondary | ICD-10-CM | POA: Diagnosis not present

## 2017-09-03 MED FILL — ACETAMINOPHEN/COD #3 TABLET: 300-30 | 3 days supply | Qty: 15 | Fill #0

## 2017-09-03 MED FILL — LEVOCETIRIZINE 5 MG TABLET: 5 | 30 days supply | Qty: 30 | Fill #0

## 2017-09-05 DIAGNOSIS — S329XXA Fracture of unspecified parts of lumbosacral spine and pelvis, initial encounter for closed fracture: Secondary | ICD-10-CM | POA: Diagnosis not present

## 2017-09-06 MED FILL — TERBINAFINE HCL 250 MG TAB: 250 | 60 days supply | Qty: 60 | Fill #0

## 2017-09-09 MED FILL — MELOXICAM 15 MG TABLET: 15 | 30 days supply | Qty: 30 | Fill #1

## 2017-09-18 ENCOUNTER — Other Ambulatory Visit: Payer: Self-pay | Admitting: Internal Medicine

## 2017-09-18 MED FILL — CENTRATEX CAPSULE: 106-1 | 30 days supply | Qty: 60 | Fill #0

## 2017-09-19 ENCOUNTER — Telehealth: Payer: Self-pay | Admitting: Family Medicine

## 2017-09-19 NOTE — Telephone Encounter (Signed)
Copied from [place] [phone]. Topic: Quick Communication - See Telephone Encounter >> Sep 19, 2017  5:47 PM Vernona Rieger wrote: CRM for notification. See Telephone encounter for: 09/19/17.  Patient states that Dr Pablo Ledger, dermatologist prescribed " TERBINAFINE HCL 250 mg " for a fungus on her head. She said that it is making her nauseous. She contacted Dr Pablo Ledger and he said he could not prescribed medication for nausea, that she needed to contact her primary doctor. Please advise.    [place], [place]

## 2017-09-20 NOTE — Telephone Encounter (Signed)
Please advise 

## 2017-09-20 NOTE — Telephone Encounter (Signed)
Can prescribe Ondansetron 4 mg tabs, 1 tab po tid prn nausea

## 2017-09-21 MED ORDER — ONDANSETRON 4 MG PO TBDP: 4.0000 mg | ORAL_TABLET | Freq: Three times a day (TID) | ORAL | 0 refills | Status: DC | PRN

## 2017-09-21 MED FILL — ONDANSETRON ODT 4 MG TABLET: 4 | 7 days supply | Qty: 20 | Fill #0

## 2017-09-21 MED FILL — ACETAMINOPHEN/COD #3 TABLET: 300-30 | 7 days supply | Qty: 14 | Fill #0

## 2017-09-21 NOTE — Telephone Encounter (Signed)
Patient calling to check and see if the nausea medication has been sent to the pharmacy yet. States that the pharmacy closes at 6pm today and will not be open again until Monday 09/24/17. Please advise.  [place], [place]

## 2017-09-24 NOTE — Telephone Encounter (Signed)
Medication sent to pharmacy 09/20/17

## 2017-09-26 ENCOUNTER — Other Ambulatory Visit: Payer: Self-pay | Admitting: Family Medicine

## 2017-09-26 DIAGNOSIS — M1712 Unilateral primary osteoarthritis, left knee: Secondary | ICD-10-CM | POA: Diagnosis not present

## 2017-09-26 DIAGNOSIS — M1711 Unilateral primary osteoarthritis, right knee: Secondary | ICD-10-CM | POA: Diagnosis not present

## 2017-09-26 DIAGNOSIS — S329XXA Fracture of unspecified parts of lumbosacral spine and pelvis, initial encounter for closed fracture: Secondary | ICD-10-CM | POA: Insufficient documentation

## 2017-09-26 DIAGNOSIS — S32810A Multiple fractures of pelvis with stable disruption of pelvic ring, initial encounter for closed fracture: Secondary | ICD-10-CM | POA: Diagnosis not present

## 2017-09-26 DIAGNOSIS — R102 Pelvic and perineal pain: Secondary | ICD-10-CM | POA: Diagnosis not present

## 2017-09-26 MED FILL — OMEPRAZOLE 20 MG CAP: 20 | 90 days supply | Qty: 90 | Fill #1

## 2017-09-26 MED FILL — AMITRIPTYLINE HCL 100 MG TA: 100 | 90 days supply | Qty: 90 | Fill #0

## 2017-09-28 MED FILL — GABAPENTIN 300 MG CAPSULE: 300 | 90 days supply | Qty: 90 | Fill #0

## 2017-10-01 MED FILL — CYANOCOBALAMIN 1,000 MCG/ML: 1000 | 90 days supply | Qty: 3 | Fill #2

## 2017-10-07 MED FILL — LEVOCETIRIZINE 5 MG TABLET: 5 | 30 days supply | Qty: 30 | Fill #1

## 2017-10-08 MED FILL — MELOXICAM 15 MG TABLET: 15 | 30 days supply | Qty: 30 | Fill #0

## 2017-10-08 MED FILL — MONTELUKAST SOD 10 MG TAB: 10 | 30 days supply | Qty: 30 | Fill #0

## 2017-10-15 ENCOUNTER — Other Ambulatory Visit: Payer: Self-pay | Admitting: Internal Medicine

## 2017-10-15 ENCOUNTER — Other Ambulatory Visit: Payer: Self-pay | Admitting: Family Medicine

## 2017-10-15 NOTE — Telephone Encounter (Signed)
Last OV 03/08/2017 Last refill on 02/13/2017   #90 with 1 refill CSC done on 02/13/2017 UDS done on 02/13/2017---low risk

## 2017-10-15 NOTE — Telephone Encounter (Signed)
I refilled but she will need an appt prior to next refill

## 2017-10-16 ENCOUNTER — Telehealth: Payer: Self-pay

## 2017-10-16 MED FILL — CENTRATEX CAPSULE: 106-1 | 30 days supply | Qty: 60 | Fill #0

## 2017-10-16 MED FILL — ZOLPIDEM TARTRATE 5 MG TAB: 5 | 90 days supply | Qty: 90 | Fill #0

## 2017-10-16 NOTE — Telephone Encounter (Signed)
PA initiated via Covermymeds; KEY: [id number]. Awaiting determination.

## 2017-10-17 NOTE — Telephone Encounter (Signed)
PA approved. Effective 10/17/2017 to 03/26/2018.

## 2017-10-29 DIAGNOSIS — L820 Inflamed seborrheic keratosis: Secondary | ICD-10-CM | POA: Diagnosis not present

## 2017-10-29 DIAGNOSIS — B350 Tinea barbae and tinea capitis: Secondary | ICD-10-CM | POA: Diagnosis not present

## 2017-10-29 MED FILL — FLUCONAZOLE 200 MG TAB: 200 | 64 days supply | Qty: 16 | Fill #0

## 2017-10-29 MED FILL — ACETAMINOPHEN/COD #3 TABLET: 300-30 | 15 days supply | Qty: 30 | Fill #0

## 2017-10-29 MED FILL — FLUTICASONE PROP 50 MCG SPR: 50 | 30 days supply | Qty: 16 | Fill #1

## 2017-10-30 ENCOUNTER — Telehealth: Payer: Self-pay | Admitting: Family Medicine

## 2017-10-30 NOTE — Telephone Encounter (Signed)
I remember answering this question. Yes she can have cefdinir 300 mg po bid x 5 days afte she gives [place] a urine sample fo ua and culture

## 2017-10-30 NOTE — Telephone Encounter (Signed)
Pt called to check into request, let pt know of anything she can do, contact to advise

## 2017-10-30 NOTE — Telephone Encounter (Signed)
Copied from [place] [phone]. Topic: Quick Communication - See Telephone Encounter >> Oct 30, 2017  1:36 PM Bea Graff, NT wrote: CRM for notification. See Telephone encounter for: 10/30/17. Pt states that she has a UTI and is requesting medication to be sent to the pharmacy. She would like to know if she needs to come by to drop off UA.  [place], [place] - [place] [phone] (Phone) [phone] (Fax)

## 2017-10-30 NOTE — Telephone Encounter (Signed)
Please advise 

## 2017-10-31 ENCOUNTER — Other Ambulatory Visit: Payer: Self-pay

## 2017-10-31 ENCOUNTER — Other Ambulatory Visit: Payer: Self-pay | Admitting: *Deleted

## 2017-10-31 ENCOUNTER — Other Ambulatory Visit (INDEPENDENT_AMBULATORY_CARE_PROVIDER_SITE_OTHER): Payer: PPO

## 2017-10-31 DIAGNOSIS — N390 Urinary tract infection, site not specified: Secondary | ICD-10-CM

## 2017-10-31 LAB — URINALYSIS, ROUTINE W REFLEX MICROSCOPIC
Bilirubin Urine: NEGATIVE
Ketones, ur: NEGATIVE
Nitrite: POSITIVE — AB
Specific Gravity, Urine: 1.01 (ref 1.000–1.030)
Total Protein, Urine: 30 — AB
Urine Glucose: NEGATIVE
Urobilinogen, UA: 0.2 (ref 0.0–1.0)
pH: 6 (ref 5.0–8.0)

## 2017-10-31 MED ORDER — CEPHALEXIN 500 MG PO CAPS: 500.0000 mg | ORAL_CAPSULE | Freq: Three times a day (TID) | ORAL | 0 refills | Status: DC

## 2017-10-31 MED FILL — CEPHALEXIN 500 MG CAPSULE: 500 | 7 days supply | Qty: 21 | Fill #0

## 2017-10-31 MED FILL — DICLOFENAC SODIUM 1% GEL: 1 | 25 days supply | Qty: 400 | Fill #0

## 2017-10-31 NOTE — Addendum Note (Signed)
Addended by: Harl Bowie on: 10/31/2017 02:34 PM   Modules accepted: Orders

## 2017-10-31 NOTE — Telephone Encounter (Signed)
Called patient regarding ABT. States she will be in office today to give urine specemine,. States she was given Cephelexin 500 mg (07/20/17) in the past. States she can take this,because of the many allergies she has she can not take Cefdinir.Marland Kitchen

## 2017-10-31 NOTE — Addendum Note (Signed)
Addended by: Harl Bowie on: 10/31/2017 02:35 PM   Modules accepted: Orders

## 2017-10-31 NOTE — Telephone Encounter (Signed)
ABT sent to pharmacy for patient. (Cephalexin per her request.

## 2017-10-31 NOTE — Telephone Encounter (Signed)
Yes the cephelexin is fine although it is very similar to the Cefdinir

## 2017-11-02 LAB — URINE CULTURE
MICRO NUMBER:: 90934746
SPECIMEN QUALITY:: ADEQUATE

## 2017-11-05 MED FILL — MONTELUKAST SOD 10 MG TAB: 10 | 90 days supply | Qty: 90 | Fill #0

## 2017-11-05 MED FILL — LEVOCETIRIZINE 5 MG TABLET: 5 | 90 days supply | Qty: 90 | Fill #0

## 2017-11-07 DIAGNOSIS — M1711 Unilateral primary osteoarthritis, right knee: Secondary | ICD-10-CM | POA: Diagnosis not present

## 2017-11-07 DIAGNOSIS — S32810D Multiple fractures of pelvis with stable disruption of pelvic ring, subsequent encounter for fracture with routine healing: Secondary | ICD-10-CM | POA: Diagnosis not present

## 2017-11-07 DIAGNOSIS — R102 Pelvic and perineal pain: Secondary | ICD-10-CM | POA: Diagnosis not present

## 2017-11-07 DIAGNOSIS — M1712 Unilateral primary osteoarthritis, left knee: Secondary | ICD-10-CM | POA: Diagnosis not present

## 2017-11-13 MED FILL — MELOXICAM 15 MG TABLET: 15 | 30 days supply | Qty: 30 | Fill #1

## 2017-11-13 MED FILL — POTASSIUM CL ER 20 MEQ TABL: 20 | 90 days supply | Qty: 90 | Fill #1

## 2017-11-27 ENCOUNTER — Telehealth: Payer: Self-pay | Admitting: Family Medicine

## 2017-11-27 DIAGNOSIS — M8580 Other specified disorders of bone density and structure, unspecified site: Secondary | ICD-10-CM

## 2017-11-27 DIAGNOSIS — E2839 Other primary ovarian failure: Secondary | ICD-10-CM

## 2017-11-27 NOTE — Telephone Encounter (Signed)
Yes put it in for osteopenia and estrogen deficiency

## 2017-11-27 NOTE — Telephone Encounter (Signed)
Can patient have bone scan will order    Please advise

## 2017-11-27 NOTE — Telephone Encounter (Signed)
Orders placed. Patient notified.

## 2017-11-27 NOTE — Telephone Encounter (Signed)
Copied from [place] [phone]. Topic: General - Other >> Nov 27, 2017 11:53 AM Cecelia Byars, NT wrote: Reason for [facility]  called to  follow up on previous request for bone density scans for the patient that are required by [employer]  ,said the patient they need to be done by   11/ 30/19 please call [phone] with any questions

## 2017-11-30 ENCOUNTER — Other Ambulatory Visit: Payer: Self-pay | Admitting: Family Medicine

## 2017-11-30 DIAGNOSIS — S32502D Unspecified fracture of left pubis, subsequent encounter for fracture with routine healing: Secondary | ICD-10-CM | POA: Diagnosis not present

## 2017-11-30 DIAGNOSIS — M545 Low back pain: Secondary | ICD-10-CM | POA: Diagnosis not present

## 2017-11-30 DIAGNOSIS — S32502A Unspecified fracture of left pubis, initial encounter for closed fracture: Secondary | ICD-10-CM | POA: Insufficient documentation

## 2017-11-30 MED FILL — ACETAMINOPHEN/COD #3 TABLET: 300-30 | 15 days supply | Qty: 30 | Fill #0

## 2017-11-30 NOTE — Telephone Encounter (Signed)
Requesting:xanax Contract:yes UDS:low risk next screen 02/13/18 Last OV:05/17/17 Next OV:02/25/18 Last Refill:02/13/17  #[id number] Database:   Please advise

## 2017-12-03 MED FILL — ALPRAZolam 1 MG TABS: 1 | 90 days supply | Qty: 90 | Fill #0

## 2017-12-06 DIAGNOSIS — M5416 Radiculopathy, lumbar region: Secondary | ICD-10-CM | POA: Diagnosis not present

## 2017-12-11 DIAGNOSIS — M5416 Radiculopathy, lumbar region: Secondary | ICD-10-CM | POA: Diagnosis not present

## 2017-12-11 MED FILL — MELOXICAM 15 MG TABLET: 15 | 30 days supply | Qty: 30 | Fill #0

## 2017-12-11 MED FILL — predniSONE 10 MG (21) TBPK: 10 | 6 days supply | Qty: 21 | Fill #0

## 2017-12-13 MED FILL — ACETAMINOPHEN/COD #3 TABLET: 300-30 | 15 days supply | Qty: 30 | Fill #0

## 2017-12-18 MED FILL — CENTRATEX CAPSULE: 106-1 | 30 days supply | Qty: 60 | Fill #1

## 2017-12-24 DIAGNOSIS — B350 Tinea barbae and tinea capitis: Secondary | ICD-10-CM | POA: Diagnosis not present

## 2017-12-24 DIAGNOSIS — L821 Other seborrheic keratosis: Secondary | ICD-10-CM | POA: Diagnosis not present

## 2017-12-25 ENCOUNTER — Other Ambulatory Visit: Payer: Self-pay | Admitting: Family Medicine

## 2017-12-25 MED FILL — OMEPRAZOLE 20 MG CPDR: 20 | 90 days supply | Qty: 90 | Fill #2

## 2017-12-25 MED FILL — AMITRIPTYLINE HCL 100 MG TA: 100 | 90 days supply | Qty: 90 | Fill #1

## 2017-12-25 MED FILL — GABAPENTIN 300 MG CAPSULE: 300 | 90 days supply | Qty: 90 | Fill #0

## 2017-12-27 DIAGNOSIS — M5416 Radiculopathy, lumbar region: Secondary | ICD-10-CM | POA: Diagnosis not present

## 2017-12-27 MED FILL — hydrOXYzine HCL 25 MG TABS: 25 | 30 days supply | Qty: 60 | Fill #1

## 2017-12-31 MED FILL — ACETAMINOPHEN/COD #3 TABLET: 300-30 | 15 days supply | Qty: 30 | Fill #0

## 2018-01-01 DIAGNOSIS — Z139 Encounter for screening, unspecified: Secondary | ICD-10-CM | POA: Diagnosis not present

## 2018-01-01 DIAGNOSIS — M5136 Other intervertebral disc degeneration, lumbar region: Secondary | ICD-10-CM | POA: Diagnosis not present

## 2018-01-01 DIAGNOSIS — M5416 Radiculopathy, lumbar region: Secondary | ICD-10-CM | POA: Diagnosis not present

## 2018-01-03 DIAGNOSIS — M5416 Radiculopathy, lumbar region: Secondary | ICD-10-CM | POA: Diagnosis not present

## 2018-01-04 DIAGNOSIS — M25562 Pain in left knee: Secondary | ICD-10-CM | POA: Diagnosis not present

## 2018-01-04 DIAGNOSIS — M25561 Pain in right knee: Secondary | ICD-10-CM | POA: Diagnosis not present

## 2018-01-04 DIAGNOSIS — S32512D Fracture of superior rim of left pubis, subsequent encounter for fracture with routine healing: Secondary | ICD-10-CM | POA: Diagnosis not present

## 2018-01-08 DIAGNOSIS — M48061 Spinal stenosis, lumbar region without neurogenic claudication: Secondary | ICD-10-CM | POA: Diagnosis not present

## 2018-01-08 DIAGNOSIS — M5416 Radiculopathy, lumbar region: Secondary | ICD-10-CM | POA: Diagnosis not present

## 2018-01-08 DIAGNOSIS — M545 Low back pain: Secondary | ICD-10-CM | POA: Diagnosis not present

## 2018-01-08 DIAGNOSIS — M5136 Other intervertebral disc degeneration, lumbar region: Secondary | ICD-10-CM | POA: Diagnosis not present

## 2018-01-10 MED FILL — ACETAMINOPHEN/COD #3 TABLET: 300-30 | 3 days supply | Qty: 12 | Fill #0

## 2018-01-14 MED FILL — ZOLPIDEM TARTRATE 5 MG TAB: 5 | 90 days supply | Qty: 90 | Fill #1

## 2018-01-14 MED FILL — ACETAMINOPHEN/COD #3 TABLET: 300-30 | 12 days supply | Qty: 48 | Fill #1

## 2018-01-16 ENCOUNTER — Telehealth: Payer: Self-pay | Admitting: Family Medicine

## 2018-01-16 DIAGNOSIS — N390 Urinary tract infection, site not specified: Secondary | ICD-10-CM

## 2018-01-16 MED FILL — OXYCODONE-ACETAMINOPHEN 5-3: 5-325 | 15 days supply | Qty: 60 | Fill #0

## 2018-01-16 NOTE — Telephone Encounter (Signed)
Pt wants to know if dr. Charlett Blake can put order in for a urine specimen because of the recurrent UTI's. Pt states she had called the call center twice, however there is no documentation for the calls. Please advise. Pt would like for you to contact once order is placed.

## 2018-01-17 ENCOUNTER — Other Ambulatory Visit (INDEPENDENT_AMBULATORY_CARE_PROVIDER_SITE_OTHER): Payer: PPO

## 2018-01-17 DIAGNOSIS — N390 Urinary tract infection, site not specified: Secondary | ICD-10-CM

## 2018-01-17 NOTE — Telephone Encounter (Signed)
Orders placed patient notified patient scheduled for today

## 2018-01-18 ENCOUNTER — Telehealth: Payer: Self-pay

## 2018-01-18 ENCOUNTER — Other Ambulatory Visit: Payer: Self-pay | Admitting: Family Medicine

## 2018-01-18 DIAGNOSIS — N390 Urinary tract infection, site not specified: Secondary | ICD-10-CM

## 2018-01-18 LAB — URINALYSIS, ROUTINE W REFLEX MICROSCOPIC
Bilirubin Urine: NEGATIVE
Ketones, ur: NEGATIVE
Nitrite: POSITIVE — AB
Specific Gravity, Urine: 1.01 (ref 1.000–1.030)
Urine Glucose: NEGATIVE
Urobilinogen, UA: 0.2 (ref 0.0–1.0)
pH: 6 (ref 5.0–8.0)

## 2018-01-18 MED ORDER — CEPHALEXIN 500 MG PO CAPS: 500.0000 mg | ORAL_CAPSULE | Freq: Three times a day (TID) | ORAL | 0 refills | Status: DC

## 2018-01-18 MED FILL — AMITIZA 24 MCG CAPSULES: 24 | 30 days supply | Qty: 60 | Fill #0

## 2018-01-18 NOTE — Telephone Encounter (Signed)
Pt. phoned [facility], asking about abx for her apparent UTI. Author notified [facility] that we are still waiting on urine cx results, may take 2 more days or so to result, but that would route to Dr. Charlett Blake to see if she would like to order something in the meantime.

## 2018-01-18 NOTE — Telephone Encounter (Signed)
I sent a refill on her Keflex to [facility] while we await urine culture

## 2018-01-19 LAB — URINE CULTURE
MICRO NUMBER:: 91280915
SPECIMEN QUALITY:: ADEQUATE

## 2018-01-21 NOTE — Telephone Encounter (Signed)
Patient made aware.

## 2018-01-24 MED FILL — MELOXICAM 15 MG TABLET: 15 | 30 days supply | Qty: 30 | Fill #1

## 2018-01-26 NOTE — Progress Notes (Addendum)
[facility] [address], [place], [place] [address] [phone]  Date:  01/28/2018   Name:  Alexis Olson   DOB:  1933/04/22   MRN:  [id number]  PCP:  Mosie Lukes, MD    Chief Complaint: Cough (one week, productive cough, congestion in chest, no fever)   History of Present Illness:  Alexis Olson is a 84 y.o. very pleasant female patient who presents with the following:  Pt of Dr. Charlett Blake with history of GERD, hyperlipidemia, HTN, s/p gastric bypass surgery. I have not seen her in the past.   Here today with concern of chest congestion for about one week Last night she was coughing up some material which concerned her husband, he wanted her to come in!  The material she is bringing up in yellow/ clear.  Not green however No fever noted  She was just recently on keflex for a UTI - these sx are resolved She may have some wheezing at night She has some ventolin and also Symbicort  No runny nose or sneezing   She is a retired Therapist, sports. She does suffer from back pains now and is seeing Dr. Nelva Bush.  He is treating her with oxycodone and have so far done 2 epidural steroid injections- one more pending   Patient Active Problem List   Diagnosis Date Noted  . [place] filter in place 03/08/2017  . Pain of right lower extremity 03/08/2017  . Preventative health care 02/13/2017  . Hyponatremia 05/02/2016  . Recurrent UTI 08/23/2015  . GERD (gastroesophageal reflux disease) 08/23/2015  . HSV (herpes simplex virus) infection 08/16/2015  . Hyperlipidemia, mild 08/16/2015  . Osteopenia, senile   . Chronic diarrhea   . Anemia   . OSTEOMYELITIS, VERTEBRA 05/05/2009  . [place] DISEASE, LUMBOSACRAL SPINE 04/22/2009  . Vitamin B 12 deficiency 07/09/2008  . DIVERTICULOSIS, COLON 07/09/2008  . COLONIC POLYPS, ADENOMATOUS, HX OF 07/09/2008  . Anxiety state 08/08/2007  . INSOMNIA 08/08/2007  . Bariatric surgery status 08/08/2007  .  ALLERGIC RHINITIS 09/18/2006    Past Medical History:  Diagnosis Date  . ALLERGIC RHINITIS   . ANEMIA, CHRONIC    Malabsorption related to bypass hx, B12 and iron deficiency  . ANXIETY   . ASTHMA   . Bariatric surgery status   . Colon perforation ([place]) 08/2014   following polypetomy during colo, surgical repair  . COLONIC POLYPS, ADENOMATOUS, HX OF 2010  . DIVERTICULOSIS, COLON   . GERD (gastroesophageal reflux disease) 08/23/2015  . HSV (herpes simplex virus) infection 08/16/2015  . Hypertension   . Hyponatremia 05/02/2016  . INSOMNIA   . Lumbosacral disc disease    Chronic pain; History of osteomyelitis 2010 following ESI complication  . Obesity   . Recurrent UTI 08/23/2015  . URINARY URGENCY   . VITAMIN B12 DEFICIENCY     Past Surgical History:  Procedure Laterality Date  . [place]   for ruptured disc  . CHOLECYSTECTOMY    . greenfield filter    . ileojejunal bypass  1976   for obesity  . LAPAROTOMY N/A 09/18/2014   Procedure: EXPLORATORY LAPAROTOMY WITH REPAIR OF CECIAL PERFORATION;  Surgeon: Autumn Messing III, MD;  Location: [facility];  Service: General;  Laterality: N/A;  . TONSILLECTOMY    . VASCULAR SURGERY Right    revascularization right lower extremity    Social History   Tobacco Use  . Smoking status: Never Smoker  .  Smokeless tobacco: Never Used  Substance Use Topics  . Alcohol use: No    Alcohol/week: 0.0 standard drinks  . Drug use: No    Family History  Problem Relation Age of Onset  . Cervical cancer Mother   . Stroke Mother   . Liver disease Sister   . Kidney disease Sister   . Cirrhosis Sister   . Colon cancer Neg Hx   . Esophageal cancer Neg Hx   . Rectal cancer Neg Hx   . Stomach cancer Neg Hx     Allergies  Allergen Reactions  . Ativan [Lorazepam] Anaphylaxis and Other (See Comments)    Deathly allergic; "resp rate dropped dropped to 6"  . Other Anaphylaxis    Red peppers--choking, also  . Azithromycin Diarrhea  . Benadryl  [Diphenhydramine Hcl] Other (See Comments)    Paradoxical response  . Ciprofloxacin Hcl Other (See Comments)    Causes yeast infection and refuses to take  . Codeine Phosphate Nausea Only    Can tolerate in limited amounts  . Hydrocodone Itching and Nausea And Vomiting  . Hydrocodone-Homatropine Itching and Nausea And Vomiting  . Levaquin [Levofloxacin In D5w] Other (See Comments)    Muscle soreness  . Meperidine Hcl Other (See Comments)    Reaction ??  . Molds & Smuts Other (See Comments)    unsure  . Sulfa Antibiotics Other (See Comments)    Joint pain  . Tape Other (See Comments)    No Coban wrap (per the patient)  . Ultram [Tramadol Hcl] Other (See Comments)    "Can't move joints"    Medication list has been reviewed and updated.  Current Outpatient Medications on File Prior to Visit  Medication Sig Dispense Refill  . acetaminophen-codeine (TYLENOL #3) 300-30 MG tablet Take 1 tablet by mouth every 4 (four) hours as needed for moderate pain or severe pain. 15 tablet 0  . albuterol (PROVENTIL HFA;VENTOLIN HFA) 108 (90 BASE) MCG/ACT inhaler Inhale 1-2 puffs into the lungs every 6 (six) hours as needed for wheezing or shortness of breath.    . ALPRAZolam (XANAX) 1 MG tablet TAKE 1/2 TO 1 TABLET BY MOUTH AT BEDTIME AS NEEDED 90 tablet 1  . amitriptyline (ELAVIL) 100 MG tablet TAKE 1 TABLET (100 MG TOTAL) BY MOUTH DAILY. 90 tablet 1  . azelastine (OPTIVAR) 0.05 % ophthalmic solution Place 1 drop into both eyes daily as needed (for dry eyes).     . budesonide-formoterol (SYMBICORT) 160-4.5 MCG/ACT inhaler Inhale 2 puffs into the lungs See admin instructions. Inhale 2 puffs into the lungs one to two times a day    . cetirizine (ZYRTEC) 10 MG tablet Take 10 mg by mouth daily.     . cholecalciferol (VITAMIN D) 1000 UNITS tablet Take 1,000 Units by mouth daily.    . cyanocobalamin (,VITAMIN B-12,) 1000 MCG/ML injection INJECT 1 ML INTO THE MUSCLE EVERY 30 DAYS. 6 mL 1  . diclofenac sodium  (VOLTAREN) 1 % GEL Apply 2-4 g topically See admin instructions. Apply 2-4 grams to both knees once a day  0  . dicyclomine (BENTYL) 10 MG capsule Take 1 capsule (10 mg total) by mouth 3 (three) times daily. (Patient taking differently: Take 10 mg by mouth daily as needed (for diarrhea & symptoms). ) 270 capsule 3  . Diphenhyd-Hydrocort-Nystatin (FIRST-DUKES MOUTHWASH) SUSP Take 1 to 2 teaspoonfuls every 4 to 6 hours as directed. 120 mL 2  . diphenoxylate-atropine (LOMOTIL) 2.5-0.025 MG tablet Take 2 tablets by mouth 3 (  three) times daily as needed for diarrhea or loose stools. 150 tablet 3  . doxycycline (VIBRA-TABS) 100 MG tablet Take 1 tablet (100 mg total) by mouth 2 (two) times daily. 20 tablet 0  . Fe Fum-FA-B Cmp-C-Zn-Mg-Mn-Cu (CENTRATEX) 106-1 MG CAPS TAKE 1 CAPSULE BY MOUTH 2 TIMES DAILY. (Patient taking differently: Take 1 capsule by mouth at bedtime) 60 each 5  . Fe Fum-FA-B Cmp-C-Zn-Mg-Mn-Cu (CENTRATEX) 106-1 MG CAPS TAKE 1 CAPSULE BY MOUTH TWICE DAILY 60 each 2  . fenofibrate micronized (ANTARA) 130 MG capsule Take 1 capsule (130 mg total) by mouth daily. 90 capsule 0  . fluticasone (FLONASE) 50 MCG/ACT nasal spray 2 sprays by Nasal route daily.      Marland Kitchen gabapentin (NEURONTIN) 300 MG capsule TAKE 1 CAPSULE BY MOUTH AT BEDTIME. 90 capsule 0  . hydrOXYzine (ATARAX) 25 MG tablet Take 25 mg by mouth 3 (three) times daily as needed for itching.     . levocetirizine (XYZAL) 5 MG tablet Take 1 tablet (5 mg total) by mouth every evening. 90 tablet 3  . magnesium oxide (MAG-OX 400) 400 MG tablet Take 400 mg by mouth 2 (two) times daily.     . meloxicam (MOBIC) 15 MG tablet Take 15 mg by mouth daily.  1  . montelukast (SINGULAIR) 10 MG tablet Take 10 mg by mouth at bedtime.     Marland Kitchen omeprazole (PRILOSEC) 20 MG capsule TAKE 1 CAPSULE BY MOUTH DAILY. (Patient taking differently: Take 20 mg by mouth at bedtime) 90 capsule 3  . ondansetron (ZOFRAN-ODT) 4 MG disintegrating tablet Take 1 tablet (4 mg total)  by mouth 3 (three) times daily as needed for nausea or vomiting. 20 tablet 0  . polyethylene glycol powder (MIRALAX) powder Take 17 g by mouth daily. (Patient taking differently: Take 17 g by mouth daily as needed for mild constipation. ) 255 g 0  . potassium chloride SA (K-DUR,KLOR-CON) 20 MEQ tablet TAKE 1 TABLET BY MOUTH ONCE DAILY 90 tablet 1  . PX ENTERIC ASPIRIN 81 MG EC tablet Take 81 mg by mouth 2 (two) times a week.     . valACYclovir (VALTREX) 500 MG tablet Take 1 tablet (500 mg total) by mouth as needed. (Patient taking differently: Take 500 mg by mouth daily as needed (for flares of fever blisters). ) 30 tablet 5  . vitamin E 400 UNIT capsule Take 400 Units by mouth daily.      Marland Kitchen zolpidem (AMBIEN) 5 MG tablet TAKE 1 TABLET BY MOUTH AT BEDTIME 90 tablet 1   No current facility-administered medications on file prior to visit.     Review of Systems:  As per HPI- otherwise negative. She is frustrated by her back pain which she hopes will be improved soon    Physical Examination: Vitals:   01/28/18 1031  BP: 120/72  Pulse: 80  Resp: 16  Temp: 98.2 F (36.8 C)  SpO2: 95%   Vitals:   01/28/18 1031  Weight: 190 lb (86.2 kg)  Height: 5\' 4"  (1.626 m)   Body mass index is 32.61 kg/m. Ideal Body Weight: Weight in (lb) to have BMI = 25: 145.3  GEN: WDWN, NAD, Non-toxic, A & O x 3, overweight, looks well  HEENT: Atraumatic, Normocephalic. Neck supple. No masses, No LAD.  Bilateral TM wnl, oropharynx normal.  PEERL,EOMI.   Ears and Nose: No external deformity. CV: RRR, No M/G/R. No JVD. No thrill. No extra heart sounds. PULM: CTA B, no wheezes, crackles, rhonchi. No retractions. No  resp. distress. No accessory muscle use. ABD: S, NT, ND EXTR: No c/c/e NEURO Normal gait.  PSYCH: Normally interactive. Conversant. Not depressed or anxious appearing.  Calm demeanor.    Assessment and Plan: Acute bronchitis, unspecified organism - Plan: doxycycline (VIBRA-TABS) 100 MG tablet,  DISCONTINUED: doxycycline (VIBRA-TABS) 100 MG tablet  History of anemia - Plan: CBC  Treat for cough/ bronchitis with doxycycline She would like to do a periodic CBC for history of anemia which is fine She will continue her home probiotic.  Will alert me if not feeling better soon- Sooner if worse.     Signed Lamar Blinks, MD  Received her labs 11/5 Results for orders placed or performed in visit on 01/28/18  CBC  Result Value Ref Range   WBC 6.9 4.0 - 10.5 K/uL   RBC 3.54 (L) 3.87 - 5.11 Mil/uL   Platelets 244.0 150.0 - 400.0 K/uL   Hemoglobin 11.2 (L) 12.0 - 15.0 g/dL   HCT 33.9 (L) 36.0 - 46.0 %   MCV 95.8 78.0 - 100.0 fl   MCHC 32.9 30.0 - 36.0 g/dL   RDW 13.7 11.5 - 15.5 %   colonoscopy 2016 Mild anemia again noted Letter to pt- will ask her to see Dr. Charlett Blake for a follow-up/ repeat in 6 weeks.  Also mail stool cards to her

## 2018-01-28 ENCOUNTER — Ambulatory Visit (INDEPENDENT_AMBULATORY_CARE_PROVIDER_SITE_OTHER): Payer: PPO | Admitting: Family Medicine

## 2018-01-28 ENCOUNTER — Encounter: Payer: Self-pay | Admitting: Family Medicine

## 2018-01-28 VITALS — BP 120/72 | HR 80 | Temp 98.2°F | Resp 16 | Ht 64.0 in | Wt 190.0 lb

## 2018-01-28 DIAGNOSIS — J209 Acute bronchitis, unspecified: Secondary | ICD-10-CM

## 2018-01-28 DIAGNOSIS — Z862 Personal history of diseases of the blood and blood-forming organs and certain disorders involving the immune mechanism: Secondary | ICD-10-CM | POA: Diagnosis not present

## 2018-01-28 LAB — CBC
HCT: 33.9 % — ABNORMAL LOW (ref 36.0–46.0)
Hemoglobin: 11.2 g/dL — ABNORMAL LOW (ref 12.0–15.0)
MCHC: 32.9 g/dL (ref 30.0–36.0)
MCV: 95.8 fl (ref 78.0–100.0)
Platelets: 244 10*3/uL (ref 150.0–400.0)
RBC: 3.54 Mil/uL — ABNORMAL LOW (ref 3.87–5.11)
RDW: 13.7 % (ref 11.5–15.5)
WBC: 6.9 10*3/uL (ref 4.0–10.5)

## 2018-01-28 MED ORDER — DOXYCYCLINE HYCLATE 100 MG PO TABS: 100.0000 mg | ORAL_TABLET | Freq: Two times a day (BID) | ORAL | 0 refills | Status: DC

## 2018-01-28 MED FILL — DOXYCYCLINE HYCLATE 100 MG: 100 | 10 days supply | Qty: 20 | Fill #0

## 2018-01-28 NOTE — Patient Instructions (Signed)
We are going to treat you for bronchitis with doxycycline As you recently took keflex, please use an OTC pro-biotic as well  Let me know if you are not feeling better in the next few days- Sooner if worse.

## 2018-01-29 NOTE — Addendum Note (Signed)
Addended by: Lamar Blinks C on: 01/29/2018 01:36 PM   Modules accepted: Orders

## 2018-01-31 DIAGNOSIS — M5416 Radiculopathy, lumbar region: Secondary | ICD-10-CM | POA: Diagnosis not present

## 2018-02-01 MED FILL — MONTELUKAST SOD 10 MG TAB: 10 | 90 days supply | Qty: 90 | Fill #1

## 2018-02-04 ENCOUNTER — Telehealth: Payer: Self-pay

## 2018-02-04 NOTE — Telephone Encounter (Signed)
Received request from Dr. Marco Collie, medical director of [facility], to have pt. get a bone mineral density scan and/or rx for fosamax prior to 02/23/18 per osteoporosis management with patients with fx hx protocol. LOV with PCP 03/08/17. Routed to Dr. Charlett Blake to advise on next steps, including possible need for OV with PCP prior to orders.

## 2018-02-04 NOTE — Telephone Encounter (Signed)
I am happy to arrange Bone Density for patient. Let her know we reviewed her chart and it is time fo he next test. Confirm where she wants it and order. Thanks.

## 2018-02-05 NOTE — Telephone Encounter (Signed)
Author phoned pt. to update on need for bone density scan. Pt. seemed distracted, asking about labwork. Author reviewed most recent Hgb, and asked if she had ever received ifob cards, and pt. stated "I have some old ones here somewhere". Author explained importance of using cards to detect bleeding, and offered to send her new cards, and pt. was non-committal. Chief Strategy Officer then redirected pt. back to need for bone density scan, and pt. opted to wait but said the imaging department "can call me to schedule". Author phoned imaging department, and carolyn from imaging stated she would reach out to pt. To see if she would like to make appointment.

## 2018-02-06 MED FILL — OXYCODONE-ACETAMINOPHEN 5-3: 5-325 | 15 days supply | Qty: 60 | Fill #0

## 2018-02-07 ENCOUNTER — Other Ambulatory Visit (HOSPITAL_BASED_OUTPATIENT_CLINIC_OR_DEPARTMENT_OTHER): Payer: PPO

## 2018-02-08 MED FILL — LEVOCETIRIZINE 5 MG TABLET: 5 | 90 days supply | Qty: 90 | Fill #1

## 2018-02-15 ENCOUNTER — Ambulatory Visit: Payer: Self-pay

## 2018-02-15 DIAGNOSIS — J209 Acute bronchitis, unspecified: Secondary | ICD-10-CM

## 2018-02-15 MED ORDER — DOXYCYCLINE HYCLATE 100 MG PO TABS: 100.0000 mg | ORAL_TABLET | Freq: Two times a day (BID) | ORAL | 0 refills | Status: DC

## 2018-02-15 MED FILL — DOXYCYCLINE HYCLATE 100 MG: 100 | 10 days supply | Qty: 20 | Fill #0

## 2018-02-15 NOTE — Telephone Encounter (Signed)
Outgoing call to Patient, to Patient who still complains of having a cough with wheezing.  Patient states she completed., the doxycycline last week continues to have cough at night along with wheezing.  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  She listened to her lungs with a stethoscope.  States the wheezing is really deep. Onset was over a week ago.  Unaware if she has a fever.  Did not take her temp.  Reports greenish yellow sputum to clear.  Denies Cardiac history and Lung history.  Patient does not request an appointment.  Wishes something be called in and a return call from Provider.      Reason for Disposition . [1] Dry lingering cough AND [2] recent cold symptoms  (e.g., runny nose, fever)  Answer Assessment - Initial Assessment Questions 1. ONSET: "When did the cough begin?"      Started over week 2. SEVERITY: "How bad is the cough today?"      8-10 at night 3. 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 FEVER: "Do you have a fever?" If so, ask: "What is your temperature, how was it measured, and when did it start?"     Didn't take 5. SPUTUM: "Describe the color of your sputum" (e.g., clear, white, yellow, green), "Has there been any change recently?"     Greenish yell; yellow to clear 6. HEMOPTYSIS: "Are you coughing up any blood?" If so ask: "How much, flecks, streaks, tablespoons, etc.?"     no 7. CARDIAC HISTORY: "Do you have any history of heart disease?" (e.g., heart attack, congestive heart failure)      no 8. LUNG HISTORY: "Do you have any history of lung disease?"  (e.g., pulmonary embolus, asthma, emphysema/COPD)     no 9. OTHER SYMPTOMS: "Do you have any other symptoms? (e.g., runny nose, wheezing, chest pain)     wheezing 10. PREGNANCY: "Is there any chance you are pregnant?" "When was your last menstrual period?"       na 11. TRAVEL: "Have you traveled out of the country in the last month?" (e.g.,  travel history, exposures)       no  Protocols used: COUGH - CHRONIC-A-AH

## 2018-02-15 NOTE — Addendum Note (Signed)
Addended by: Magdalene Molly A on: 02/15/2018 04:24 PM   Modules accepted: Orders

## 2018-02-15 NOTE — Telephone Encounter (Signed)
Spoke with Dr. Charlett Blake, she advise patient that we can try another round of the doxy along with plan mucinex bid and probiotic daily, hydrate well, and if this course does not help patient will need to come in to be seen. She voiced her understanding.

## 2018-02-15 NOTE — Telephone Encounter (Signed)
Please advise 

## 2018-02-18 ENCOUNTER — Other Ambulatory Visit: Payer: Self-pay | Admitting: Family Medicine

## 2018-02-19 MED FILL — POTASSIUM CHLORIDE CRYS ER: 20 | 90 days supply | Qty: 90 | Fill #0

## 2018-02-20 MED FILL — OXYCODONE-ACETAMINOPHEN 5-3: 5-325 | 15 days supply | Qty: 60 | Fill #0

## 2018-02-25 ENCOUNTER — Encounter: Payer: PPO | Admitting: Family Medicine

## 2018-02-25 ENCOUNTER — Ambulatory Visit (HOSPITAL_BASED_OUTPATIENT_CLINIC_OR_DEPARTMENT_OTHER)
Admission: RE | Admit: 2018-02-25 | Discharge: 2018-02-25 | Disposition: A | Discharge status: Home / Self Care | Payer: PPO | Source: Ambulatory Visit | Attending: Family Medicine | Admitting: Family Medicine

## 2018-02-25 DIAGNOSIS — M8580 Other specified disorders of bone density and structure, unspecified site: Secondary | ICD-10-CM | POA: Diagnosis not present

## 2018-02-25 DIAGNOSIS — E2839 Other primary ovarian failure: Secondary | ICD-10-CM | POA: Insufficient documentation

## 2018-02-25 DIAGNOSIS — M8589 Other specified disorders of bone density and structure, multiple sites: Secondary | ICD-10-CM | POA: Diagnosis not present

## 2018-02-25 DIAGNOSIS — Z780 Asymptomatic menopausal state: Secondary | ICD-10-CM | POA: Diagnosis not present

## 2018-02-28 DIAGNOSIS — M5136 Other intervertebral disc degeneration, lumbar region: Secondary | ICD-10-CM | POA: Diagnosis not present

## 2018-02-28 DIAGNOSIS — Z79891 Long term (current) use of opiate analgesic: Secondary | ICD-10-CM | POA: Diagnosis not present

## 2018-02-28 DIAGNOSIS — M5416 Radiculopathy, lumbar region: Secondary | ICD-10-CM | POA: Diagnosis not present

## 2018-02-28 MED FILL — MELOXICAM 15 MG TABLET: 15 | 30 days supply | Qty: 30 | Fill #0

## 2018-03-01 ENCOUNTER — Ambulatory Visit: Payer: Self-pay | Admitting: *Deleted

## 2018-03-01 NOTE — Telephone Encounter (Signed)
Patient called to say that she has a UTI. She is a patient of Dr Charlett Blake who is out today. Patient complain of urinary frequency, and burning. She would like to get some information on what to do.Please advise Ph# [phone]   Returned call to patient regarding having a UTI. Pt wanted to know if she could leave a urine specimen for her pcp to see and call her in an antibiotic. She has frequent UTI's. She denies having fever. She is having pain with voiding and frequent urination. She is drinking lots of water and takes a cranberry supplement to keep her from having so many infections. She started having symptoms on Wednesday.  Notified flow, Princess, at [facility] at a [place] regarding leaving a specimen for a provider.  Pt advised to go to another [facility] facility to be checked or go to an Urgent Care.  Pt does not want to go. Per protocol she should be seen within 24 hours. She will like for Dr. Charlett Blake to give her a call when she returns on Monday. Home care advice given with verbal understanding.  Reason for Disposition . Urinating more frequently than usual (i.e., frequency)  Answer Assessment - Initial Assessment Questions 1. SYMPTOM: "What's the main symptom you're concerned about?" (e.g., frequency, incontinence)     Frequency  2. ONSET: "When did the  frequency  start?"     Wednesday 3. PAIN: "Is there any pain?" If so, ask: "How bad is it?" (Scale: 1-10; mild, moderate, severe)     Yes, #6 4. CAUSE: "What do you think is causing the symptoms?"     UTI 5. OTHER SYMPTOMS: "Do you have any other symptoms?" (e.g., fever, flank pain, blood in urine, pain with urination)     Pain with urination 6. PREGNANCY: "Is there any chance you are pregnant?" "When was your last menstrual period?"     n/a  Protocols used: URINARY [facility]

## 2018-03-02 ENCOUNTER — Ambulatory Visit (INDEPENDENT_AMBULATORY_CARE_PROVIDER_SITE_OTHER): Payer: PPO | Admitting: Medical

## 2018-03-02 ENCOUNTER — Encounter: Payer: Self-pay | Admitting: Medical

## 2018-03-02 ENCOUNTER — Other Ambulatory Visit (HOSPITAL_COMMUNITY)
Admission: RE | Admit: 2018-03-02 | Discharge: 2018-03-02 | Disposition: A | Discharge status: Home / Self Care | Payer: PPO | Source: Other Acute Inpatient Hospital | Attending: Medical | Admitting: Medical

## 2018-03-02 VITALS — BP 116/62 | HR 101 | Temp 98.4°F | Ht 64.0 in | Wt 189.0 lb

## 2018-03-02 DIAGNOSIS — R309 Painful micturition, unspecified: Secondary | ICD-10-CM

## 2018-03-02 DIAGNOSIS — N3001 Acute cystitis with hematuria: Secondary | ICD-10-CM

## 2018-03-02 LAB — POC URINALSYSI DIPSTICK (AUTOMATED)
Bilirubin, UA: NEGATIVE
Glucose, UA: NEGATIVE
Ketones, UA: NEGATIVE
Nitrite, UA: POSITIVE
Protein, UA: POSITIVE — AB
Spec Grav, UA: 1.01 (ref 1.010–1.025)
Urobilinogen, UA: NEGATIVE E.U./dL — AB
pH, UA: 6 (ref 5.0–8.0)

## 2018-03-02 MED ORDER — PHENAZOPYRIDINE HCL 200 MG PO TABS: 200.0000 mg | ORAL_TABLET | Freq: Three times a day (TID) | ORAL | 0 refills | Status: DC | PRN

## 2018-03-02 MED ORDER — CEPHALEXIN 500 MG PO CAPS: 500.0000 mg | ORAL_CAPSULE | Freq: Two times a day (BID) | ORAL | 0 refills | Status: DC

## 2018-03-02 NOTE — Patient Instructions (Addendum)
Your appear to have a urinary tract infection. I am prescribing keflex antibiotic for the probable infection. Hydrate well. I am sending out a urine culture. During the interim if your signs and symptoms worsen rather than improving please notify us. We will notify your when the culture results are back.  Rx pyridium  Follow up in 7 days or as needed.

## 2018-03-02 NOTE — Progress Notes (Signed)
Subjective:    Patient ID: Alexis Olson, female    DOB: 1933/06/24, 84 y.o.   MRN: [id number]  HPI  Pt in today reporting urinary symptoms.  Dysuria- yes Frequent urination-yes Hesitancy-no Suprapubic pressure-no Fever-no chills-yes/maybe Nausea-no Vomiting-no CVA pain-no History of UTI- yes.(by culture) Gross hematuria-no   Review of Systems  Constitutional: Positive for chills.       Maybe she is not sure  Respiratory: Negative for cough, chest tightness, shortness of breath and wheezing.   Cardiovascular: Negative for chest pain and palpitations.  Gastrointestinal: Negative for abdominal distention, abdominal pain, blood in stool and nausea.  Genitourinary: Positive for dysuria, frequency and urgency. Negative for hematuria, pelvic pain and vaginal pain.  Musculoskeletal: Negative for back pain.  Neurological: Negative for dizziness and headaches.  Hematological: Negative for adenopathy. Does not bruise/bleed easily.  Psychiatric/Behavioral: Negative for behavioral problems and confusion.    Past Medical History:  Diagnosis Date  . ALLERGIC RHINITIS   . ANEMIA, CHRONIC    Malabsorption related to bypass hx, B12 and iron deficiency  . ANXIETY   . ASTHMA   . Bariatric surgery status   . Colon perforation ([place]) 08/2014   following polypetomy during colo, surgical repair  . COLONIC POLYPS, ADENOMATOUS, HX OF 2010  . DIVERTICULOSIS, COLON   . GERD (gastroesophageal reflux disease) 08/23/2015  . HSV (herpes simplex virus) infection 08/16/2015  . Hypertension   . Hyponatremia 05/02/2016  . INSOMNIA   . Lumbosacral disc disease    Chronic pain; History of osteomyelitis 2010 following ESI complication  . Obesity   . Recurrent UTI 08/23/2015  . URINARY URGENCY   . VITAMIN B12 DEFICIENCY      Social History   Socioeconomic History  . Marital status: Married    Spouse name: Not on file  . Number of children: 3  . Years of education: Not on file  . Highest  education level: Not on file  Occupational History  . Occupation: retired Programmer, multimedia: RETIRED  Social Needs  . Financial resource strain: Not on file  . Food insecurity:    Worry: Not on file    Inability: Not on file  . Transportation needs:    Medical: Not on file    Non-medical: Not on file  Tobacco Use  . Smoking status: Never Smoker  . Smokeless tobacco: Never Used  Substance and Sexual Activity  . Alcohol use: No    Alcohol/week: 0.0 standard drinks  . Drug use: No  . Sexual activity: Never    Partners: Male  Lifestyle  . Physical activity:    Days per week: Not on file    Minutes per session: Not on file  . Stress: Not on file  Relationships  . Social connections:    Talks on phone: Not on file    Gets together: Not on file    Attends religious service: Not on file    Active member of club or organization: Not on file    Attends meetings of clubs or organizations: Not on file    Relationship status: Not on file  . Intimate partner violence:    Fear of current or ex partner: Not on file    Emotionally abused: Not on file    Physically abused: Not on file    Forced sexual activity: Not on file  Other Topics Concern  . Not on file  Social History Narrative   Lives with spouse, Indep ADLs   Supportive  family nearby   No dietary restrictions    Past Surgical History:  Procedure Laterality Date  . [place]   for ruptured disc  . CHOLECYSTECTOMY    . [place] filter    . ileojejunal bypass  1976   for obesity  . LAPAROTOMY N/A 09/18/2014   Procedure: EXPLORATORY LAPAROTOMY WITH REPAIR OF CECIAL PERFORATION;  Surgeon: Autumn Messing III, MD;  Location: [facility];  Service: General;  Laterality: N/A;  . TONSILLECTOMY    . VASCULAR SURGERY Right    revascularization right lower extremity    Family History  Problem Relation Age of Onset  . Cervical cancer Mother   . Stroke Mother   . Liver disease Sister   . Kidney disease Sister   . Cirrhosis  Sister   . Colon cancer Neg Hx   . Esophageal cancer Neg Hx   . Rectal cancer Neg Hx   . Stomach cancer Neg Hx     Allergies  Allergen Reactions  . Ativan [Lorazepam] Anaphylaxis and Other (See Comments)    Deathly allergic; "resp rate dropped dropped to 6"  . Other Anaphylaxis    Red peppers--choking, also  . Azithromycin Diarrhea  . Benadryl [Diphenhydramine Hcl] Other (See Comments)    Paradoxical response  . Ciprofloxacin Hcl Other (See Comments)    Causes yeast infection and refuses to take  . Codeine Phosphate Nausea Only    Can tolerate in limited amounts  . Hydrocodone Itching and Nausea And Vomiting  . Hydrocodone-Homatropine Itching and Nausea And Vomiting  . Levaquin [Levofloxacin In D5w] Other (See Comments)    Muscle soreness  . Meperidine Hcl Other (See Comments)    Reaction ??  . Molds & Smuts Other (See Comments)    unsure  . Sulfa Antibiotics Other (See Comments)    Joint pain  . Tape Other (See Comments)    No Coban wrap (per the patient)  . Ultram [Tramadol Hcl] Other (See Comments)    "Can't move joints"    Current Outpatient Medications on File Prior to Visit  Medication Sig Dispense Refill  . acetaminophen-codeine (TYLENOL #3) 300-30 MG tablet Take 1 tablet by mouth every 4 (four) hours as needed for moderate pain or severe pain. 15 tablet 0  . albuterol (PROVENTIL HFA;VENTOLIN HFA) 108 (90 BASE) MCG/ACT inhaler Inhale 1-2 puffs into the lungs every 6 (six) hours as needed for wheezing or shortness of breath.    . ALPRAZolam (XANAX) 1 MG tablet TAKE 1/2 TO 1 TABLET BY MOUTH AT BEDTIME AS NEEDED 90 tablet 1  . amitriptyline (ELAVIL) 100 MG tablet TAKE 1 TABLET (100 MG TOTAL) BY MOUTH DAILY. 90 tablet 1  . azelastine (OPTIVAR) 0.05 % ophthalmic solution Place 1 drop into both eyes daily as needed (for dry eyes).     . budesonide-formoterol (SYMBICORT) 160-4.5 MCG/ACT inhaler Inhale 2 puffs into the lungs See admin instructions. Inhale 2 puffs into the  lungs one to two times a day    . cetirizine (ZYRTEC) 10 MG tablet Take 10 mg by mouth daily.     . cholecalciferol (VITAMIN D) 1000 UNITS tablet Take 1,000 Units by mouth daily.    . cyanocobalamin (,VITAMIN B-12,) 1000 MCG/ML injection INJECT 1 ML INTO THE MUSCLE EVERY 30 DAYS. 6 mL 1  . diclofenac sodium (VOLTAREN) 1 % GEL Apply 2-4 g topically See admin instructions. Apply 2-4 grams to both knees once a day  0  . dicyclomine (BENTYL) 10 MG capsule Take  1 capsule (10 mg total) by mouth 3 (three) times daily. (Patient taking differently: Take 10 mg by mouth daily as needed (for diarrhea & symptoms). ) 270 capsule 3  . Diphenhyd-Hydrocort-Nystatin (FIRST-DUKES MOUTHWASH) SUSP Take 1 to 2 teaspoonfuls every 4 to 6 hours as directed. 120 mL 2  . diphenoxylate-atropine (LOMOTIL) 2.5-0.025 MG tablet Take 2 tablets by mouth 3 (three) times daily as needed for diarrhea or loose stools. 150 tablet 3  . doxycycline (VIBRA-TABS) 100 MG tablet Take 1 tablet (100 mg total) by mouth 2 (two) times daily. 20 tablet 0  . Fe Fum-FA-B Cmp-C-Zn-Mg-Mn-Cu (CENTRATEX) 106-1 MG CAPS TAKE 1 CAPSULE BY MOUTH TWICE DAILY 60 each 2  . fenofibrate micronized (ANTARA) 130 MG capsule Take 1 capsule (130 mg total) by mouth daily. 90 capsule 0  . fluticasone (FLONASE) 50 MCG/ACT nasal spray 2 sprays by Nasal route daily.      Marland Kitchen gabapentin (NEURONTIN) 300 MG capsule TAKE 1 CAPSULE BY MOUTH AT BEDTIME. 90 capsule 0  . hydrOXYzine (ATARAX) 25 MG tablet Take 25 mg by mouth 3 (three) times daily as needed for itching.     . levocetirizine (XYZAL) 5 MG tablet Take 1 tablet (5 mg total) by mouth every evening. 90 tablet 3  . magnesium oxide (MAG-OX 400) 400 MG tablet Take 400 mg by mouth 2 (two) times daily.     . meloxicam (MOBIC) 15 MG tablet Take 15 mg by mouth daily.  1  . montelukast (SINGULAIR) 10 MG tablet Take 10 mg by mouth at bedtime.     Marland Kitchen omeprazole (PRILOSEC) 20 MG capsule TAKE 1 CAPSULE BY MOUTH DAILY. (Patient taking  differently: Take 20 mg by mouth at bedtime) 90 capsule 3  . ondansetron (ZOFRAN-ODT) 4 MG disintegrating tablet Take 1 tablet (4 mg total) by mouth 3 (three) times daily as needed for nausea or vomiting. 20 tablet 0  . oxyCODONE-acetaminophen (PERCOCET/ROXICET) 5-325 MG tablet   0  . polyethylene glycol powder (MIRALAX) powder Take 17 g by mouth daily. (Patient taking differently: Take 17 g by mouth daily as needed for mild constipation. ) 255 g 0  . potassium chloride SA (K-DUR,KLOR-CON) 20 MEQ tablet TAKE 1 TABLET BY MOUTH ONCE DAILY 90 tablet 1  . PX ENTERIC ASPIRIN 81 MG EC tablet Take 81 mg by mouth 2 (two) times a week.     . valACYclovir (VALTREX) 500 MG tablet Take 1 tablet (500 mg total) by mouth as needed. (Patient taking differently: Take 500 mg by mouth daily as needed (for flares of fever blisters). ) 30 tablet 5  . vitamin E 400 UNIT capsule Take 400 Units by mouth daily.      Marland Kitchen zolpidem (AMBIEN) 5 MG tablet TAKE 1 TABLET BY MOUTH AT BEDTIME 90 tablet 1   No current facility-administered medications on file prior to visit.     BP 116/62 (BP Location: Left Arm, Patient Position: Sitting, Cuff Size: Large)   Pulse (!) 101   Temp 98.4 F (36.9 C) (Oral)   Ht 5\' 4"  (1.626 m)   Wt 189 lb (85.7 kg)   SpO2 94%   BMI 32.44 kg/m       Objective:   Physical Exam   General Appearance- Not in acute distress.   Chest and Lung Exam Auscultation: Breath sounds:-Normal. Adventitious sounds:- No Adventitious sounds.  Cardiovascular Auscultation:Rythm - Regular. Heart Sounds -Normal heart sounds.  Abdomen Inspection:-Inspection Normal.  Palpation/Perucssion: Palpation and Percussion of the abdomen reveal- Non Tender, No  Rebound tenderness, No rigidity(Guarding) and No Palpable abdominal masses.  Liver:-Normal.  Spleen:- Normal.   Back- no cva tenderness     Assessment & Plan:  Your appear to have a urinary tract infection. I am prescribing keflex antibiotic for the  probable infection. Hydrate well. I am sending out a urine culture. During the interim if your signs and symptoms worsen rather than improving please notify us. We will notify your when the culture results are back.  Rx pyridium  Follow up in 7 days or as needed.  Mackie Pai, PA-C

## 2018-03-04 ENCOUNTER — Telehealth: Payer: Self-pay | Admitting: Medical

## 2018-03-04 LAB — URINE CULTURE: Culture: >=100000 — AB

## 2018-03-04 MED ORDER — BUDESONIDE-FORMOTEROL FUMARATE 160-4.5 MCG/ACT IN AERO: 2.0000 | INHALATION_SPRAY | RESPIRATORY_TRACT | 2 refills | Status: AC

## 2018-03-04 NOTE — Telephone Encounter (Signed)
Spoke with patient last week and let her know that Dr. Charlett Blake was out of the office at this time, and the current providers in the office wanted her to be seen. Patient refused visit and stated Dr. Charlett Blake has always giving her an antibiotic when she called in about her urine and she was not coming in.  Please advise

## 2018-03-04 NOTE — Telephone Encounter (Signed)
She was given keflex if she is still symptomatic I need her to do a UA with culture so I know what her but is sensitive to so we can pick the right medicine.

## 2018-03-04 NOTE — Telephone Encounter (Signed)
Rx symbicort inhaler.

## 2018-03-06 MED FILL — OXYCODONE-ACETAMINOPHEN 5-3: 5-325 | 15 days supply | Qty: 60 | Fill #0

## 2018-03-06 NOTE — Telephone Encounter (Signed)
She can just do a urinalysis with culture if she is not having fever or severe pain

## 2018-03-06 NOTE — Telephone Encounter (Signed)
Does she need an OV or just go to the lab?

## 2018-03-07 NOTE — Progress Notes (Signed)
Patient advised of results and to call by next week if not better for 1 gm of Rocephin as a nurse visit. Also advised to use her symbicort for the wheezing and if not better will need evaluation she reported has an appt coming up with her allergy Doctor.

## 2018-03-07 NOTE — Telephone Encounter (Signed)
Patient notified

## 2018-03-11 DIAGNOSIS — M25562 Pain in left knee: Secondary | ICD-10-CM | POA: Diagnosis not present

## 2018-03-11 DIAGNOSIS — M170 Bilateral primary osteoarthritis of knee: Secondary | ICD-10-CM | POA: Diagnosis not present

## 2018-03-11 DIAGNOSIS — M25561 Pain in right knee: Secondary | ICD-10-CM | POA: Diagnosis not present

## 2018-03-13 DIAGNOSIS — J4530 Mild persistent asthma, uncomplicated: Secondary | ICD-10-CM | POA: Diagnosis not present

## 2018-03-13 DIAGNOSIS — J301 Allergic rhinitis due to pollen: Secondary | ICD-10-CM | POA: Diagnosis not present

## 2018-03-13 DIAGNOSIS — J3081 Allergic rhinitis due to animal (cat) (dog) hair and dander: Secondary | ICD-10-CM | POA: Diagnosis not present

## 2018-03-13 DIAGNOSIS — J3089 Other allergic rhinitis: Secondary | ICD-10-CM | POA: Diagnosis not present

## 2018-03-13 MED FILL — hydrOXYzine HCL 25 MG TABS: 25 | 30 days supply | Qty: 90 | Fill #0

## 2018-03-14 ENCOUNTER — Telehealth: Payer: Self-pay

## 2018-03-14 NOTE — Telephone Encounter (Signed)
Author phoned pt. To f/u UTI sx. Pt. Stated her sx are better but still persisting. Pt. Open to receiving rocephin 1g injection by nurse per Gillis [facility] suggestion from 12/9 result note. Appointment made for 12/23 at [facility] made aware. Pt. also stated that her wheezing has improved since taking symbicort bid. Pt. has CPE with Dr. Charlett Blake 12/31.

## 2018-03-14 NOTE — Telephone Encounter (Signed)
Would you let me know how patient is doing when she comes in for Rocephin injection.  I offered the Rocephin on 03/04/2018.  Is been sometime since she was seen.  So please let me know if she has fevers ,chills ,severe urinarypain or costovertebral angle/back pain.

## 2018-03-14 NOTE — Telephone Encounter (Signed)
-----   Message from Mackie Pai, PA-C sent at 03/04/2018  8:49 AM EST ----- Your did have klebsiella grow out in your urine. Study shows some resistance to antibiotic but sensitivity to cephalasporin class which keflex the antibiotic you are on is in that class of meds. But if your symptoms are not improving then would ask you schedule nurse visit for rocephin im injection.Depending on your symptoms might recommend rocephin injection such as on Tuesday and another on Friday.

## 2018-03-18 ENCOUNTER — Telehealth: Payer: Self-pay

## 2018-03-18 ENCOUNTER — Ambulatory Visit (INDEPENDENT_AMBULATORY_CARE_PROVIDER_SITE_OTHER): Payer: PPO

## 2018-03-18 DIAGNOSIS — N390 Urinary tract infection, site not specified: Secondary | ICD-10-CM

## 2018-03-18 MED ORDER — CEFTRIAXONE SODIUM 1 G IJ SOLR
1.0000 g | Freq: Once | INTRAMUSCULAR | Status: AC
  Administered 2018-03-18: 1 g via INTRAMUSCULAR

## 2018-03-18 MED FILL — ALPRAZolam 1 MG TABS: 1 | 90 days supply | Qty: 90 | Fill #1

## 2018-03-18 NOTE — Telephone Encounter (Signed)
Follow up call made to patient because she was late for her appointment scheduled  Today Rocephin injection. Calling patient to let her know we have an opening in the morning if she could not make today's appointment. Patient states she is on her way.

## 2018-03-18 NOTE — Progress Notes (Addendum)
Pre visit review using our clinic tool,if applicable. No additional management support is needed unless otherwise documented below in the visit note.   Patient in for Rocephin 1 gm injection. Given Right hip. Patient tolerated well.No complaints voiced.   Given for uti.  Mackie Pai, PA-C

## 2018-03-19 DIAGNOSIS — M5136 Other intervertebral disc degeneration, lumbar region: Secondary | ICD-10-CM | POA: Diagnosis not present

## 2018-03-19 DIAGNOSIS — M5416 Radiculopathy, lumbar region: Secondary | ICD-10-CM | POA: Diagnosis not present

## 2018-03-25 ENCOUNTER — Other Ambulatory Visit: Payer: Self-pay | Admitting: Family Medicine

## 2018-03-25 MED FILL — AMITRIPTYLINE HCL 100 MG TA: 100 | 90 days supply | Qty: 90 | Fill #0

## 2018-03-25 MED FILL — OMEPRAZOLE 20 MG CPDR: 20 | 90 days supply | Qty: 90 | Fill #3

## 2018-03-26 ENCOUNTER — Encounter: Payer: Self-pay | Admitting: Family Medicine

## 2018-03-26 ENCOUNTER — Ambulatory Visit (INDEPENDENT_AMBULATORY_CARE_PROVIDER_SITE_OTHER): Payer: PPO | Admitting: Family Medicine

## 2018-03-26 VITALS — BP 102/62 | HR 92 | Temp 98.2°F | Resp 18 | Wt 192.2 lb

## 2018-03-26 DIAGNOSIS — M5137 Other intervertebral disc degeneration, lumbosacral region: Secondary | ICD-10-CM | POA: Diagnosis not present

## 2018-03-26 DIAGNOSIS — M810 Age-related osteoporosis without current pathological fracture: Secondary | ICD-10-CM

## 2018-03-26 DIAGNOSIS — E785 Hyperlipidemia, unspecified: Secondary | ICD-10-CM

## 2018-03-26 DIAGNOSIS — N390 Urinary tract infection, site not specified: Secondary | ICD-10-CM

## 2018-03-26 DIAGNOSIS — Z0000 Encounter for general adult medical examination without abnormal findings: Secondary | ICD-10-CM | POA: Diagnosis not present

## 2018-03-26 DIAGNOSIS — E538 Deficiency of other specified B group vitamins: Secondary | ICD-10-CM | POA: Diagnosis not present

## 2018-03-26 MED ORDER — ALENDRONATE SODIUM 70 MG PO TABS: 70.0000 mg | ORAL_TABLET | ORAL | 5 refills | Status: DC

## 2018-03-26 MED ORDER — MAGIC MOUTHWASH: 5.0000 mL | Freq: Four times a day (QID) | ORAL | 1 refills | Status: DC | PRN

## 2018-03-26 NOTE — Assessment & Plan Note (Signed)
Vitamin B12 check today

## 2018-03-26 NOTE — Assessment & Plan Note (Signed)
Patient encouraged to maintain heart healthy diet, regular exercise, adequate sleep. Consider daily probiotics. Take medications as prescribed. Immunizations up to date

## 2018-03-26 NOTE — Patient Instructions (Addendum)
Due to insurance you have to try the Fosamax first but if you fail it we document that then we move on to Prolia shots twice a year.  Can do your labs at [facility] on [address] any time starting on January 2   Preventive Care 65 Years and Older, Female Preventive care refers to lifestyle choices and visits with your health care provider that can promote health and wellness. What does preventive care include?  A yearly physical exam. This is also called an annual well check.  Dental exams once or twice a year.  Routine eye exams. Ask your health care provider how often you should have your eyes checked.  Personal lifestyle choices, including: ? Daily care of your teeth and gums. ? Regular physical activity. ? Eating a healthy diet. ? Avoiding tobacco and drug use. ? Limiting alcohol use. ? Practicing safe sex. ? Taking low-dose aspirin every day. ? Taking vitamin and mineral supplements as recommended by your health care provider. What happens during an annual well check? The services and screenings done by your health care provider during your annual well check will depend on your age, overall health, lifestyle risk factors, and family history of disease. Counseling Your health care provider may ask you questions about your:  Alcohol use.  Tobacco use.  Drug use.  Emotional well-being.  Home and relationship well-being.  Sexual activity.  Eating habits.  History of falls.  Memory and ability to understand (cognition).  Work and work Statistician.  Reproductive health.  Screening You may have the following tests or measurements:  Height, weight, and BMI.  Blood pressure.  Lipid and cholesterol levels. These may be checked every 5 years, or more frequently if you are over 73 years old.  Skin check.  Lung cancer screening. You may have this screening every year starting at age 39 if you have a 30-pack-year history of smoking and currently smoke or have quit within  the past 15 years.  Colorectal cancer screening. All adults should have this screening starting at age 62 and continuing until age 68. You will have tests every 1-10 years, depending on your results and the type of screening test. People at increased risk should start screening at an earlier age. Screening tests may include: ? Guaiac-based fecal occult blood testing. ? Fecal immunochemical test (FIT). ? Stool DNA test. ? Virtual colonoscopy. ? Sigmoidoscopy. During this test, a flexible tube with a tiny camera (sigmoidoscope) is used to examine your rectum and lower colon. The sigmoidoscope is inserted through your anus into your rectum and lower colon. ? Colonoscopy. During this test, a long, thin, flexible tube with a tiny camera (colonoscope) is used to examine your entire colon and rectum.  Hepatitis C blood test.  Hepatitis B blood test.  Sexually transmitted disease (STD) testing.  Diabetes screening. This is done by checking your blood sugar (glucose) after you have not eaten for a while (fasting). You may have this done every 1-3 years.  Bone density scan. This is done to screen for osteoporosis. You may have this done starting at age 46.  Mammogram. This may be done every 1-2 years. Talk to your health care provider about how often you should have regular mammograms. Talk with your health care provider about your test results, treatment options, and if necessary, the need for more tests. Vaccines Your health care provider may recommend certain vaccines, such as:  Influenza vaccine. This is recommended every year.  Tetanus, diphtheria, and acellular pertussis (Tdap, Td)  vaccine. You may need a Td booster every 10 years.  Varicella vaccine. You may need this if you have not been vaccinated.  Zoster vaccine. You may need this after age 8.  Measles, mumps, and rubella (MMR) vaccine. You may need at least one dose of MMR if you were born in 1957 or later. You may also need a  second dose.  Pneumococcal 13-valent conjugate (PCV13) vaccine. One dose is recommended after age 48.  Pneumococcal polysaccharide (PPSV23) vaccine. One dose is recommended after age 50.  Meningococcal vaccine. You may need this if you have certain conditions.  Hepatitis A vaccine. You may need this if you have certain conditions or if you travel or work in places where you may be exposed to hepatitis A.  Hepatitis B vaccine. You may need this if you have certain conditions or if you travel or work in places where you may be exposed to hepatitis B.  Haemophilus influenzae type b (Hib) vaccine. You may need this if you have certain conditions. Talk to your health care provider about which screenings and vaccines you need and how often you need them. This information is not intended to replace advice given to you by your health care provider. Make sure you discuss any questions you have with your health care provider. Document Released: 04/09/2015 Document Revised: 05/03/2017 Document Reviewed: 01/12/2015 Elsevier Interactive Patient Education  2019 [employer].

## 2018-03-26 NOTE — Assessment & Plan Note (Signed)
Encouraged heart healthy diet, increase exercise, avoid trans fats, consider a krill oil cap daily 

## 2018-03-26 NOTE — Assessment & Plan Note (Signed)
Check UA with culture. 

## 2018-03-26 NOTE — Assessment & Plan Note (Addendum)
Encouraged to get adequate exercise, calcium and vitamin d intake. Started Fosamax and consider Prolia if not tolerating.

## 2018-03-27 ENCOUNTER — Ambulatory Visit (HOSPITAL_COMMUNITY)
Admission: EM | Admit: 2018-03-27 | Discharge: 2018-03-27 | Disposition: A | Discharge status: Home / Self Care | Payer: PPO | Attending: Family Medicine | Admitting: Family Medicine

## 2018-03-27 ENCOUNTER — Encounter (HOSPITAL_COMMUNITY): Payer: Self-pay | Admitting: Emergency Medicine

## 2018-03-27 ENCOUNTER — Ambulatory Visit (INDEPENDENT_AMBULATORY_CARE_PROVIDER_SITE_OTHER): Payer: PPO

## 2018-03-27 DIAGNOSIS — R109 Unspecified abdominal pain: Secondary | ICD-10-CM | POA: Diagnosis not present

## 2018-03-27 DIAGNOSIS — R339 Retention of urine, unspecified: Secondary | ICD-10-CM

## 2018-03-27 DIAGNOSIS — K5900 Constipation, unspecified: Secondary | ICD-10-CM

## 2018-03-27 NOTE — ED Triage Notes (Signed)
Has not urinated since last night.  Patient is also constipated and has had 2 enemas by a family member.

## 2018-03-28 MED FILL — AMITIZA 24 MCG CAPSULES: 24 | 30 days supply | Qty: 60 | Fill #0

## 2018-03-28 NOTE — Assessment & Plan Note (Signed)
Encouraged moist heat and gentle stretching as tolerated. May try NSAIDs and prescription meds as directed and report if symptoms worsen or seek immediate care. [place] last June and suffered a pelvic fracture. Is healing but she still has some back pain and sciatica but she is following with ortho and managing.

## 2018-03-28 NOTE — Progress Notes (Signed)
Subjective:    Patient ID: Alexis Olson, female    DOB: June 09, 1933, 85 y.o.   MRN: [id number]  No chief complaint on file.   HPI Patient is in today for annual preventative exam and follow up. She feels well today but she is very tired. They were at the hospital last night secondary to the birth of her 11th great grandchild. She is also tired from hosting the [place]. No recent febrile illness or hospitalizations.s he is managing her activities of daily living. She continues to struggle with some low back pain and radicular symptoms since her pelvic fracture and is following with orthopaedics. Is eating well and tries to stay active. Denies CP/palp/SOB/HA/congestion/fevers/GI or GU c/o. Taking meds as prescribed  Past Medical History:  Diagnosis Date  . ALLERGIC RHINITIS   . ANEMIA, CHRONIC    Malabsorption related to bypass hx, B12 and iron deficiency  . ANXIETY   . ASTHMA   . Bariatric surgery status   . Colon perforation ([facility]) 08/2014   following polypetomy during colo, surgical repair  . COLONIC POLYPS, ADENOMATOUS, HX OF 2010  . DIVERTICULOSIS, COLON   . GERD (gastroesophageal reflux disease) 08/23/2015  . HSV (herpes simplex virus) infection 08/16/2015  . Hypertension   . Hyponatremia 05/02/2016  . INSOMNIA   . Lumbosacral disc disease    Chronic pain; History of osteomyelitis 2010 following ESI complication  . Obesity   . Recurrent UTI 08/23/2015  . URINARY URGENCY   . VITAMIN B12 DEFICIENCY     Past Surgical History:  Procedure Laterality Date  . [facility]   for ruptured disc  . CHOLECYSTECTOMY    . greenfield filter    . ileojejunal bypass  1976   for obesity  . LAPAROTOMY N/A 09/18/2014   Procedure: EXPLORATORY LAPAROTOMY WITH REPAIR OF CECIAL PERFORATION;  Surgeon: Autumn Messing III, MD;  Location: [facility];  Service: General;  Laterality: N/A;  . TONSILLECTOMY    . VASCULAR SURGERY Right    revascularization right lower extremity    Family History    Problem Relation Age of Onset  . Cervical cancer Mother   . Stroke Mother   . Liver disease Sister   . Kidney disease Sister   . Cirrhosis Sister   . Colon cancer Neg Hx   . Esophageal cancer Neg Hx   . Rectal cancer Neg Hx   . Stomach cancer Neg Hx     Social History   Socioeconomic History  . Marital status: Married    Spouse name: Not on file  . Number of children: 3  . Years of education: Not on file  . Highest education level: Not on file  Occupational History  . Occupation: retired Programmer, multimedia: RETIRED  Social Needs  . Financial resource strain: Not on file  . Food insecurity:    Worry: Not on file    Inability: Not on file  . Transportation needs:    Medical: Not on file    Non-medical: Not on file  Tobacco Use  . Smoking status: Never Smoker  . Smokeless tobacco: Never Used  Substance and Sexual Activity  . Alcohol use: No    Alcohol/week: 0.0 standard drinks  . Drug use: No  . Sexual activity: Never    Partners: Male  Lifestyle  . Physical activity:    Days per week: Not on file    Minutes per session: Not on file  . Stress: Not on file  Relationships  . Social connections:    Talks on phone: Not on file    Gets together: Not on file    Attends religious service: Not on file    Active member of club or organization: Not on file    Attends meetings of clubs or organizations: Not on file    Relationship status: Not on file  . Intimate partner violence:    Fear of current or ex partner: Not on file    Emotionally abused: Not on file    Physically abused: Not on file    Forced sexual activity: Not on file  Other Topics Concern  . Not on file  Social History Narrative   Lives with spouse, Indep ADLs   Supportive family nearby   No dietary restrictions    Outpatient Medications Prior to Visit  Medication Sig Dispense Refill  . acetaminophen-codeine (TYLENOL #3) 300-30 MG tablet Take 1 tablet by mouth every 4 (four) hours as needed for  moderate pain or severe pain. 15 tablet 0  . albuterol (PROVENTIL HFA;VENTOLIN HFA) 108 (90 BASE) MCG/ACT inhaler Inhale 1-2 puffs into the lungs every 6 (six) hours as needed for wheezing or shortness of breath.    . ALPRAZolam (XANAX) 1 MG tablet TAKE 1/2 TO 1 TABLET BY MOUTH AT BEDTIME AS NEEDED 90 tablet 1  . amitriptyline (ELAVIL) 100 MG tablet TAKE 1 TABLET (100 MG TOTAL) BY MOUTH DAILY. 90 tablet 1  . azelastine (OPTIVAR) 0.05 % ophthalmic solution Place 1 drop into both eyes daily as needed (for dry eyes).     . budesonide-formoterol (SYMBICORT) 160-4.5 MCG/ACT inhaler Inhale 2 puffs into the lungs See admin instructions. Inhale 2 puffs into the lungs one to two times a day 1 Inhaler 2  . cephALEXin (KEFLEX) 500 MG capsule Take 1 capsule (500 mg total) by mouth 2 (two) times daily. 20 capsule 0  . cetirizine (ZYRTEC) 10 MG tablet Take 10 mg by mouth daily.     . cholecalciferol (VITAMIN D) 1000 UNITS tablet Take 1,000 Units by mouth daily.    . cyanocobalamin (,VITAMIN B-12,) 1000 MCG/ML injection INJECT 1 ML INTO THE MUSCLE EVERY 30 DAYS. 6 mL 1  . diclofenac sodium (VOLTAREN) 1 % GEL Apply 2-4 g topically See admin instructions. Apply 2-4 grams to both knees once a day  0  . dicyclomine (BENTYL) 10 MG capsule Take 1 capsule (10 mg total) by mouth 3 (three) times daily. (Patient taking differently: Take 10 mg by mouth daily as needed (for diarrhea & symptoms). ) 270 capsule 3  . Diphenhyd-Hydrocort-Nystatin (FIRST-DUKES MOUTHWASH) SUSP Take 1 to 2 teaspoonfuls every 4 to 6 hours as directed. 120 mL 2  . diphenoxylate-atropine (LOMOTIL) 2.5-0.025 MG tablet Take 2 tablets by mouth 3 (three) times daily as needed for diarrhea or loose stools. 150 tablet 3  . doxycycline (VIBRA-TABS) 100 MG tablet Take 1 tablet (100 mg total) by mouth 2 (two) times daily. 20 tablet 0  . Fe Fum-FA-B Cmp-C-Zn-Mg-Mn-Cu (CENTRATEX) 106-1 MG CAPS TAKE 1 CAPSULE BY MOUTH TWICE DAILY 60 each 2  . fenofibrate micronized  (ANTARA) 130 MG capsule Take 1 capsule (130 mg total) by mouth daily. 90 capsule 0  . fluticasone (FLONASE) 50 MCG/ACT nasal spray 2 sprays by Nasal route daily.      Marland Kitchen gabapentin (NEURONTIN) 300 MG capsule TAKE 1 CAPSULE BY MOUTH AT BEDTIME. 90 capsule 0  . hydrOXYzine (ATARAX) 25 MG tablet Take 25 mg by mouth 3 (three) times daily  as needed for itching.     . levocetirizine (XYZAL) 5 MG tablet Take 1 tablet (5 mg total) by mouth every evening. 90 tablet 3  . magnesium oxide (MAG-OX 400) 400 MG tablet Take 400 mg by mouth 2 (two) times daily.     . meloxicam (MOBIC) 15 MG tablet Take 15 mg by mouth daily.  1  . montelukast (SINGULAIR) 10 MG tablet Take 10 mg by mouth at bedtime.     Marland Kitchen omeprazole (PRILOSEC) 20 MG capsule TAKE 1 CAPSULE BY MOUTH DAILY. (Patient taking differently: Take 20 mg by mouth at bedtime) 90 capsule 3  . ondansetron (ZOFRAN-ODT) 4 MG disintegrating tablet Take 1 tablet (4 mg total) by mouth 3 (three) times daily as needed for nausea or vomiting. 20 tablet 0  . oxyCODONE-acetaminophen (PERCOCET/ROXICET) 5-325 MG tablet   0  . phenazopyridine (PYRIDIUM) 200 MG tablet Take 1 tablet (200 mg total) by mouth 3 (three) times daily as needed for pain. 10 tablet 0  . polyethylene glycol powder (MIRALAX) powder Take 17 g by mouth daily. (Patient taking differently: Take 17 g by mouth daily as needed for mild constipation. ) 255 g 0  . potassium chloride SA (K-DUR,KLOR-CON) 20 MEQ tablet TAKE 1 TABLET BY MOUTH ONCE DAILY 90 tablet 1  . PX ENTERIC ASPIRIN 81 MG EC tablet Take 81 mg by mouth 2 (two) times a week.     . valACYclovir (VALTREX) 500 MG tablet Take 1 tablet (500 mg total) by mouth as needed. (Patient taking differently: Take 500 mg by mouth daily as needed (for flares of fever blisters). ) 30 tablet 5  . vitamin E 400 UNIT capsule Take 400 Units by mouth daily.      Marland Kitchen zolpidem (AMBIEN) 5 MG tablet TAKE 1 TABLET BY MOUTH AT BEDTIME 90 tablet 1   No facility-administered  medications prior to visit.     Allergies  Allergen Reactions  . Ativan [Lorazepam] Anaphylaxis and Other (See Comments)    Deathly allergic; "resp rate dropped dropped to 6"  . Other Anaphylaxis    Red peppers--choking, also  . Azithromycin Diarrhea  . Benadryl [Diphenhydramine Hcl] Other (See Comments)    Paradoxical response  . Ciprofloxacin Hcl Other (See Comments)    Causes yeast infection and refuses to take  . Codeine Phosphate Nausea Only    Can tolerate in limited amounts  . Hydrocodone Itching and Nausea And Vomiting  . Hydrocodone-Homatropine Itching and Nausea And Vomiting  . Levaquin [Levofloxacin In D5w] Other (See Comments)    Muscle soreness  . Meperidine Hcl Other (See Comments)    Reaction ??  . Molds & Smuts Other (See Comments)    unsure  . Sulfa Antibiotics Other (See Comments)    Joint pain  . Tape Other (See Comments)    No Coban wrap (per the patient)  . Ultram [Tramadol Hcl] Other (See Comments)    "Can't move joints"    Review of Systems  Constitutional: Negative for chills, fever and malaise/fatigue.  HENT: Negative for congestion and hearing loss.   Eyes: Negative for discharge.  Respiratory: Negative for cough, sputum production and shortness of breath.   Cardiovascular: Negative for chest pain, palpitations and leg swelling.  Gastrointestinal: Negative for abdominal pain, blood in stool, constipation, diarrhea, heartburn, nausea and vomiting.  Genitourinary: Negative for dysuria, frequency, hematuria and urgency.  Musculoskeletal: Positive for back pain. Negative for falls and myalgias.  Skin: Negative for rash.  Neurological: Negative for dizziness, sensory  change, loss of consciousness, weakness and headaches.  Endo/Heme/Allergies: Negative for environmental allergies. Does not bruise/bleed easily.  Psychiatric/Behavioral: Negative for depression and suicidal ideas. The patient is not nervous/anxious and does not have insomnia.          Objective:    Physical Exam Constitutional:      General: She is not in acute distress.    Appearance: She is not diaphoretic.  HENT:     Head: Normocephalic and atraumatic.     Right Ear: External ear normal.     Left Ear: External ear normal.     Nose: Nose normal.     Mouth/Throat:     Pharynx: No oropharyngeal exudate.  Eyes:     General: No scleral icterus.       Right eye: No discharge.        Left eye: No discharge.     Conjunctiva/sclera: Conjunctivae normal.     Pupils: Pupils are equal, round, and reactive to light.  Neck:     Musculoskeletal: Normal range of motion and neck supple.     Thyroid: No thyromegaly.  Cardiovascular:     Rate and Rhythm: Normal rate and regular rhythm.     Heart sounds: Normal heart sounds. No murmur.  Pulmonary:     Effort: Pulmonary effort is normal. No respiratory distress.     Breath sounds: Normal breath sounds. No wheezing or rales.  Abdominal:     General: Bowel sounds are normal. There is no distension.     Palpations: Abdomen is soft. There is no mass.     Tenderness: There is no abdominal tenderness.  Musculoskeletal: Normal range of motion.        General: No tenderness.  Lymphadenopathy:     Cervical: No cervical adenopathy.  Skin:    General: Skin is warm and dry.     Findings: No rash.  Neurological:     Mental Status: She is alert and oriented to person, place, and time.     Cranial Nerves: No cranial nerve deficit.     Coordination: Coordination normal.     Deep Tendon Reflexes: Reflexes are normal and symmetric. Reflexes normal.     BP 102/62 (BP Location: Left Arm, Patient Position: Sitting, Cuff Size: Normal)   Pulse 92   Temp 98.2 F (36.8 C) (Oral)   Resp 18   Wt 192 lb 3.2 oz (87.2 kg)   SpO2 95%   BMI 32.99 kg/m  Wt Readings from Last 3 Encounters:  03/26/18 192 lb 3.2 oz (87.2 kg)  03/02/18 189 lb (85.7 kg)  01/28/18 190 lb (86.2 kg)     Lab Results  Component Value Date   WBC 6.9  01/28/2018   HGB 11.2 (L) 01/28/2018   HCT 33.9 (L) 01/28/2018   PLT 244.0 01/28/2018   GLUCOSE 54 (L) 04/18/2017   CHOL 160 02/20/2017   TRIG 141.0 02/20/2017   HDL 54.80 02/20/2017   LDLDIRECT 66.0 08/17/2015   LDLCALC 77 02/20/2017   ALT 14 03/29/2017   AST 13 03/29/2017   NA 135 04/18/2017   K 3.6 04/18/2017   CL 99 04/18/2017   CREATININE 0.56 04/18/2017   BUN 20 04/18/2017   CO2 29 04/18/2017   TSH 0.82 02/20/2017   INR 1.01 05/13/2009   HGBA1C 5.3 08/03/2006    Lab Results  Component Value Date   TSH 0.82 02/20/2017   Lab Results  Component Value Date   WBC 6.9 01/28/2018   HGB 11.2 (  L) 01/28/2018   HCT 33.9 (L) 01/28/2018   MCV 95.8 01/28/2018   PLT 244.0 01/28/2018   Lab Results  Component Value Date   NA 135 04/18/2017   K 3.6 04/18/2017   CO2 29 04/18/2017   GLUCOSE 54 (L) 04/18/2017   BUN 20 04/18/2017   CREATININE 0.56 04/18/2017   BILITOT 0.4 03/29/2017   ALKPHOS 44 03/29/2017   AST 13 03/29/2017   ALT 14 03/29/2017   PROT 6.4 03/29/2017   ALBUMIN 3.9 03/29/2017   CALCIUM 9.1 04/18/2017   ANIONGAP 9 09/26/2014   GFR 110.16 04/18/2017   Lab Results  Component Value Date   CHOL 160 02/20/2017   Lab Results  Component Value Date   HDL 54.80 02/20/2017   Lab Results  Component Value Date   LDLCALC 77 02/20/2017   Lab Results  Component Value Date   TRIG 141.0 02/20/2017   Lab Results  Component Value Date   CHOLHDL 3 02/20/2017   Lab Results  Component Value Date   HGBA1C 5.3 08/03/2006       Assessment & Plan:   Problem List Items Addressed This Visit    Vitamin B 12 deficiency    Vitamin B12 check today      Relevant Orders   CBC   Vitamin B12   DISC DISEASE, LUMBOSACRAL SPINE    Encouraged moist heat and gentle stretching as tolerated. May try NSAIDs and prescription meds as directed and report if symptoms worsen or seek immediate care. [place] last June and suffered a pelvic fracture. Is healing but she still has  some back pain and sciatica but she is following with ortho and managing.       Osteoporosis    Encouraged to get adequate exercise, calcium and vitamin d intake. Started Fosamax and consider Prolia if not tolerating.       Relevant Medications   alendronate (FOSAMAX) 70 MG tablet   Other Relevant Orders   Comprehensive metabolic panel   VITAMIN D 25 Hydroxy (Vit-D Deficiency, Fractures)   Hyperlipidemia, mild    Encouraged heart healthy diet, increase exercise, avoid trans fats, consider a krill oil cap daily      Relevant Orders   Lipid panel   RESOLVED: Recurrent UTI    Check UA with culture      Relevant Medications   magic mouthwash SOLN   Other Relevant Orders   Comprehensive metabolic panel   Urinalysis   Urine Culture   Preventative health care    Patient encouraged to maintain heart healthy diet, regular exercise, adequate sleep. Consider daily probiotics. Take medications as prescribed. Immunizations up to date      Relevant Orders   CBC   Comprehensive metabolic panel   TSH      I am having Renaldo Fiddler. Gora start on alendronate and magic mouthwash. I am also having her maintain her vitamin E, fluticasone, hydrOXYzine, magnesium oxide, azelastine, montelukast, PX ENTERIC ASPIRIN, cetirizine, levocetirizine, cholecalciferol, albuterol, polyethylene glycol powder, FIRST-DUKES MOUTHWASH, fenofibrate micronized, dicyclomine, diphenoxylate-atropine, cyanocobalamin, valACYclovir, omeprazole, diclofenac sodium, meloxicam, acetaminophen-codeine, ondansetron, CENTRATEX, zolpidem, ALPRAZolam, gabapentin, doxycycline, potassium chloride SA, oxyCODONE-acetaminophen, cephALEXin, phenazopyridine, budesonide-formoterol, and amitriptyline.  Meds ordered this encounter  Medications  . alendronate (FOSAMAX) 70 MG tablet    Sig: Take 1 tablet (70 mg total) by mouth every 7 (seven) days. Take with a full glass of water on an empty stomach.    Dispense:  4 tablet    Refill:  5  .  magic mouthwash  SOLN    Sig: Take 5 mLs by mouth 4 (four) times daily as needed for mouth pain. Swish and Spit    Dispense:  240 mL    Refill:  1    ([facility]) Diphenhydramine (12.59ml/5ml) 236ml Nystatin (100,000 units/12ml), 37ml Hydrocortisone 60mg  tablet     Penni Homans, MD

## 2018-03-29 ENCOUNTER — Ambulatory Visit: Payer: Self-pay

## 2018-03-29 MED FILL — MELOXICAM 15 MG TABLET: 15 | 30 days supply | Qty: 30 | Fill #1

## 2018-03-29 NOTE — Telephone Encounter (Signed)
Pt. reported she lost her voice 2 nights ago.  C/o mild sore throat on right side. C/o intermittent cough with production of yellow mucus.  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  Reported had bronchitis prior to Christmas, and was better, until 2 days ago.  Denied chest tightness or shortness of breath.  Denied fever.   Advised she can try to do Home care remedies, but pt. requested an appt. Next week.  Gave Care advice per protocol. Appt. Sched. 04/01/18 at 1:00 PM at PCP office.      Also reported has had diarrhea, following use of laxative, following problems with constipation, from narcotic side effect.  Reported being very tired and weak.  Advised on importance of staying hydrated with water and juices.  Pt. Verb. understanding.     Reason for Disposition . Cough with cold symptoms (e.g., runny nose, postnasal drip, throat clearing)  Answer Assessment - Initial Assessment Questions 1. ONSET: "When did the cough begin?"      2 nights ago; laryngitis; having wheezing intermittently  2. SEVERITY: "How bad is the cough today?"      Intermittent cough with yellow mucus 3. RESPIRATORY DISTRESS: "Describe your breathing."      Denied SOB 4. FEVER: "Do you have a fever?" If so, ask: "What is your temperature, how was it measured, and when did it start?"     C/o intermittent chills ; 98.9 5. SPUTUM: "Describe the color of your sputum" (clear, white, yellow, green)     yellow 6. HEMOPTYSIS: "Are you coughing up any blood?" If so ask: "How much?" (flecks, streaks, tablespoons, etc.)     no 7. CARDIAC HISTORY: "Do you have any history of heart disease?" (e.g., heart attack, congestive heart failure)      Denied  8. LUNG HISTORY: "Do you have any history of lung disease?"  (e.g., pulmonary embolus, asthma, emphysema)     denied 9. PE RISK FACTORS: "Do you have a history of blood clots?" (or: recent major surgery, recent prolonged  travel, bedridden)     N/a   10. OTHER SYMPTOMS: "Do you have any other symptoms?" (e.g., runny nose, wheezing, chest pain)       C/o wheezing, laryngitis, c/o sore throat on right side; c/o feeling weak and tired; diarrhea x 3 after taking a laxative.  11. PREGNANCY: "Is there any chance you are pregnant?" "When was your last menstrual period?"       n/a 12. TRAVEL: "Have you traveled out of the country in the last month?" (e.g., travel history, exposures)       N/a  Protocols used: [place] Message from Lennox Solders sent at 03/29/2018 2:15 PM EST   Summary: please advise   Pt is calling and has cough and weakness,  chest congestion started yesterday and was unable to talk yesterday. [facility]res no opening at [facility] today . [facility] the [facility] she will call tomorrow if needed. Pt would like nurse to call her back to see what she should try OTC

## 2018-04-01 ENCOUNTER — Encounter: Payer: Self-pay | Admitting: Medical

## 2018-04-01 ENCOUNTER — Other Ambulatory Visit: Payer: Self-pay | Admitting: Family Medicine

## 2018-04-01 ENCOUNTER — Ambulatory Visit (INDEPENDENT_AMBULATORY_CARE_PROVIDER_SITE_OTHER): Payer: PPO | Admitting: Medical

## 2018-04-01 ENCOUNTER — Other Ambulatory Visit (INDEPENDENT_AMBULATORY_CARE_PROVIDER_SITE_OTHER): Payer: PPO

## 2018-04-01 VITALS — BP 112/63 | HR 97 | Temp 97.7°F | Resp 16 | Ht 64.0 in | Wt 184.0 lb

## 2018-04-01 DIAGNOSIS — M810 Age-related osteoporosis without current pathological fracture: Secondary | ICD-10-CM

## 2018-04-01 DIAGNOSIS — Z0000 Encounter for general adult medical examination without abnormal findings: Secondary | ICD-10-CM

## 2018-04-01 DIAGNOSIS — J209 Acute bronchitis, unspecified: Secondary | ICD-10-CM | POA: Diagnosis not present

## 2018-04-01 DIAGNOSIS — E538 Deficiency of other specified B group vitamins: Secondary | ICD-10-CM | POA: Diagnosis not present

## 2018-04-01 DIAGNOSIS — J40 Bronchitis, not specified as acute or chronic: Secondary | ICD-10-CM

## 2018-04-01 DIAGNOSIS — R197 Diarrhea, unspecified: Secondary | ICD-10-CM

## 2018-04-01 DIAGNOSIS — R05 Cough: Secondary | ICD-10-CM

## 2018-04-01 DIAGNOSIS — E785 Hyperlipidemia, unspecified: Secondary | ICD-10-CM | POA: Diagnosis not present

## 2018-04-01 DIAGNOSIS — N390 Urinary tract infection, site not specified: Secondary | ICD-10-CM

## 2018-04-01 DIAGNOSIS — R059 Cough, unspecified: Secondary | ICD-10-CM

## 2018-04-01 LAB — CBC
HCT: 35 % — ABNORMAL LOW (ref 36.0–46.0)
Hemoglobin: 11.6 g/dL — ABNORMAL LOW (ref 12.0–15.0)
MCHC: 33.1 g/dL (ref 30.0–36.0)
MCV: 94.5 fl (ref 78.0–100.0)
Platelets: 256 10*3/uL (ref 150.0–400.0)
RBC: 3.71 Mil/uL — ABNORMAL LOW (ref 3.87–5.11)
RDW: 14.8 % (ref 11.5–15.5)
WBC: 10.3 10*3/uL (ref 4.0–10.5)

## 2018-04-01 LAB — COMPREHENSIVE METABOLIC PANEL
ALT: 16 U/L (ref 0–35)
AST: 12 U/L (ref 0–37)
Albumin: 3.9 g/dL (ref 3.5–5.2)
Alkaline Phosphatase: 48 U/L (ref 39–117)
BUN: 17 mg/dL (ref 6–23)
CO2: 27 mEq/L (ref 19–32)
Calcium: 8.9 mg/dL (ref 8.4–10.5)
Chloride: 101 mEq/L (ref 96–112)
Creatinine, Ser: 0.53 mg/dL (ref 0.40–1.20)
GFR: 117.12 mL/min (ref >60.00)
Glucose, Bld: 77 mg/dL (ref 70–99)
Potassium: 3.7 mEq/L (ref 3.5–5.1)
Sodium: 136 mEq/L (ref 135–145)
Total Bilirubin: 0.3 mg/dL (ref 0.2–1.2)
Total Protein: 6.2 g/dL (ref 6.0–8.3)

## 2018-04-01 LAB — LIPID PANEL
Cholesterol: 161 mg/dL (ref 0–200)
HDL: 49.8 mg/dL (ref >39.00)
LDL Cholesterol: 73 mg/dL (ref 0–99)
NonHDL: 111.38
Total CHOL/HDL Ratio: 3
Triglycerides: 193 mg/dL — ABNORMAL HIGH (ref 0.0–149.0)
VLDL: 38.6 mg/dL (ref 0.0–40.0)

## 2018-04-01 LAB — VITAMIN B12: Vitamin B-12: 244 pg/mL (ref 211–911)

## 2018-04-01 LAB — TSH: TSH: 1.21 u[IU]/mL (ref 0.35–4.50)

## 2018-04-01 LAB — VITAMIN D 25 HYDROXY (VIT D DEFICIENCY, FRACTURES): VITD: 22.49 ng/mL — ABNORMAL LOW (ref 30.00–100.00)

## 2018-04-01 MED ORDER — DOXYCYCLINE HYCLATE 100 MG PO TABS: 100.0000 mg | ORAL_TABLET | Freq: Two times a day (BID) | ORAL | 0 refills | Status: DC

## 2018-04-01 MED ORDER — BENZONATATE 100 MG PO CAPS: 100.0000 mg | ORAL_CAPSULE | Freq: Three times a day (TID) | ORAL | 0 refills | Status: DC | PRN

## 2018-04-01 MED FILL — BENZONATATE 100 MG CAP: 100 | 10 days supply | Qty: 30 | Fill #0

## 2018-04-01 MED FILL — DOXYCYCLINE HYCLATE 100 MG: 100 | 7 days supply | Qty: 14 | Fill #0

## 2018-04-01 NOTE — Patient Instructions (Addendum)
You appear to have bronchitis. Rest hydrate and tylenol for fever. I am prescribing cough medicine benzonatate, and doxycycline antibiotic. For your nasal congestion can use nasocort.  You should gradually get better. If not then notify us and would recommend a chest xray.  For recent loose stools but also chronic hx of loose stools would recommend turning in gastropanel.  Follow up in 7-10 days or as needed

## 2018-04-01 NOTE — Progress Notes (Signed)
Subjective:    Patient ID: Alexis Olson, female    DOB: 13-Jun-1933, 85 y.o.   MRN: [id number]  HPI  Pt in for cough, chest congestion, nasal congestion, sinus pressure,st and productive cough for past 6 days.  She states she has had symptoms since Mar 27, 2017.  Pt had some urinary retention around Mar 27, 2017. Pt had catheter placed(2 liters after catheter placement). Pt states she was very constipated at time of not able to urinate. ON Jan, 2,2019 her daughter who is a nurse removed catheter. No problems urinating since.  Pt notes took some laxative recently.  Pt states some watery loose stools after being constipated. Pt does have history of bowel surgery in the past years ago. Pt had xrays done at urgent care. No obstruction seen. Pt using bentyl and lomotil but still having loose stools. Hx of chronic diarrhea for years per pt. The diarrhea restarted when narcotic stopped.    Review of Systems  Constitutional: Negative for chills, fatigue and fever.  HENT: Positive for congestion and sore throat.   Respiratory: Positive for cough. Negative for chest tightness, shortness of breath and wheezing.   Cardiovascular: Negative for chest pain and palpitations.  Gastrointestinal: Positive for diarrhea. Negative for abdominal distention, abdominal pain, nausea, rectal pain and vomiting.  Endocrine: Negative for polydipsia, polyphagia and polyuria.  Genitourinary: Negative for difficulty urinating, dysuria, enuresis, flank pain and frequency.  Musculoskeletal: Negative for back pain and neck pain.  Skin: Negative for pallor.  Neurological: Negative for facial asymmetry.  Hematological: Negative for adenopathy. Does not bruise/bleed easily.  Psychiatric/Behavioral: Negative for behavioral problems, decreased concentration, dysphoric mood and suicidal ideas. The patient is not nervous/anxious and is not hyperactive.     Past Medical History:  Diagnosis Date  . ALLERGIC RHINITIS   .  ANEMIA, CHRONIC    Malabsorption related to bypass hx, B12 and iron deficiency  . ANXIETY   . ASTHMA   . Bariatric surgery status   . Colon perforation ([place]) 08/2014   following polypetomy during colo, surgical repair  . COLONIC POLYPS, ADENOMATOUS, HX OF 2010  . DIVERTICULOSIS, COLON   . GERD (gastroesophageal reflux disease) 08/23/2015  . HSV (herpes simplex virus) infection 08/16/2015  . Hypertension   . Hyponatremia 05/02/2016  . INSOMNIA   . Lumbosacral disc disease    Chronic pain; History of osteomyelitis 2010 following ESI complication  . Obesity   . Recurrent UTI 08/23/2015  . URINARY URGENCY   . VITAMIN B12 DEFICIENCY      Social History   Socioeconomic History  . Marital status: Married    Spouse name: Not on file  . Number of children: 3  . Years of education: Not on file  . Highest education level: Not on file  Occupational History  . Occupation: retired Programmer, multimedia: RETIRED  Social Needs  . Financial resource strain: Not on file  . Food insecurity:    Worry: Not on file    Inability: Not on file  . Transportation needs:    Medical: Not on file    Non-medical: Not on file  Tobacco Use  . Smoking status: Never Smoker  . Smokeless tobacco: Never Used  Substance and Sexual Activity  . Alcohol use: No    Alcohol/week: 0.0 standard drinks  . Drug use: No  . Sexual activity: Never    Partners: Male  Lifestyle  . Physical activity:    Days per week: Not on file  Minutes per session: Not on file  . Stress: Not on file  Relationships  . Social connections:    Talks on phone: Not on file    Gets together: Not on file    Attends religious service: Not on file    Active member of club or organization: Not on file    Attends meetings of clubs or organizations: Not on file    Relationship status: Not on file  . Intimate partner violence:    Fear of current or ex partner: Not on file    Emotionally abused: Not on file    Physically abused: Not on file     Forced sexual activity: Not on file  Other Topics Concern  . Not on file  Social History Narrative   Lives with spouse, Indep ADLs   Supportive family nearby   No dietary restrictions    Past Surgical History:  Procedure Laterality Date  . [place]   for ruptured disc  . CHOLECYSTECTOMY    . greenfield filter    . ileojejunal bypass  1976   for obesity  . LAPAROTOMY N/A 09/18/2014   Procedure: EXPLORATORY LAPAROTOMY WITH REPAIR OF CECIAL PERFORATION;  Surgeon: Autumn Messing III, MD;  Location: [facility];  Service: General;  Laterality: N/A;  . TONSILLECTOMY    . VASCULAR SURGERY Right    revascularization right lower extremity    Family History  Problem Relation Age of Onset  . Cervical cancer Mother   . Stroke Mother   . Liver disease Sister   . Kidney disease Sister   . Cirrhosis Sister   . Colon cancer Neg Hx   . Esophageal cancer Neg Hx   . Rectal cancer Neg Hx   . Stomach cancer Neg Hx     Allergies  Allergen Reactions  . Ativan [Lorazepam] Anaphylaxis and Other (See Comments)    Deathly allergic; "resp rate dropped dropped to 6"  . Other Anaphylaxis    Red peppers--choking, also  . Azithromycin Diarrhea  . Benadryl [Diphenhydramine Hcl] Other (See Comments)    Paradoxical response  . Ciprofloxacin Hcl Other (See Comments)    Causes yeast infection and refuses to take  . Codeine Phosphate Nausea Only    Can tolerate in limited amounts  . Hydrocodone Itching and Nausea And Vomiting  . Hydrocodone-Homatropine Itching and Nausea And Vomiting  . Levaquin [Levofloxacin In D5w] Other (See Comments)    Muscle soreness  . Levofloxacin   . Meperidine Hcl Other (See Comments)    Reaction ??  . Molds & Smuts Other (See Comments)    unsure  . Sulfa Antibiotics Other (See Comments)    Joint pain  . Tape Other (See Comments)    No Coban wrap (per the patient)  . Ultram [Tramadol Hcl] Other (See Comments)    "Can't move joints"    Current Outpatient  Medications on File Prior to Visit  Medication Sig Dispense Refill  . acetaminophen-codeine (TYLENOL #3) 300-30 MG tablet Take 1 tablet by mouth every 4 (four) hours as needed for moderate pain or severe pain. 15 tablet 0  . albuterol (PROVENTIL HFA;VENTOLIN HFA) 108 (90 BASE) MCG/ACT inhaler Inhale 1-2 puffs into the lungs every 6 (six) hours as needed for wheezing or shortness of breath.    Marland Kitchen alendronate (FOSAMAX) 70 MG tablet Take 1 tablet (70 mg total) by mouth every 7 (seven) days. Take with a full glass of water on an empty stomach. 4 tablet 5  .  ALPRAZolam (XANAX) 1 MG tablet TAKE 1/2 TO 1 TABLET BY MOUTH AT BEDTIME AS NEEDED 90 tablet 1  . amitriptyline (ELAVIL) 100 MG tablet TAKE 1 TABLET (100 MG TOTAL) BY MOUTH DAILY. 90 tablet 1  . azelastine (OPTIVAR) 0.05 % ophthalmic solution Place 1 drop into both eyes daily as needed (for dry eyes).     . budesonide-formoterol (SYMBICORT) 160-4.5 MCG/ACT inhaler Inhale 2 puffs into the lungs See admin instructions. Inhale 2 puffs into the lungs one to two times a day 1 Inhaler 2  . cephALEXin (KEFLEX) 500 MG capsule Take 1 capsule (500 mg total) by mouth 2 (two) times daily. 20 capsule 0  . cetirizine (ZYRTEC) 10 MG tablet Take 10 mg by mouth daily.     . cholecalciferol (VITAMIN D) 1000 UNITS tablet Take 1,000 Units by mouth daily.    . cyanocobalamin (,VITAMIN B-12,) 1000 MCG/ML injection INJECT 1 ML INTO THE MUSCLE EVERY 30 DAYS. 6 mL 1  . diclofenac sodium (VOLTAREN) 1 % GEL Apply 2-4 g topically See admin instructions. Apply 2-4 grams to both knees once a day  0  . dicyclomine (BENTYL) 10 MG capsule Take 1 capsule (10 mg total) by mouth 3 (three) times daily. (Patient taking differently: Take 10 mg by mouth daily as needed (for diarrhea & symptoms). ) 270 capsule 3  . Diphenhyd-Hydrocort-Nystatin (FIRST-DUKES MOUTHWASH) SUSP Take 1 to 2 teaspoonfuls every 4 to 6 hours as directed. 120 mL 2  . diphenoxylate-atropine (LOMOTIL) 2.5-0.025 MG tablet  Take 2 tablets by mouth 3 (three) times daily as needed for diarrhea or loose stools. 150 tablet 3  . doxycycline (VIBRA-TABS) 100 MG tablet Take 1 tablet (100 mg total) by mouth 2 (two) times daily. 20 tablet 0  . Fe Fum-FA-B Cmp-C-Zn-Mg-Mn-Cu (CENTRATEX) 106-1 MG CAPS TAKE 1 CAPSULE BY MOUTH TWICE DAILY 60 each 2  . fenofibrate micronized (ANTARA) 130 MG capsule Take 1 capsule (130 mg total) by mouth daily. 90 capsule 0  . fluticasone (FLONASE) 50 MCG/ACT nasal spray 2 sprays by Nasal route daily.      Marland Kitchen gabapentin (NEURONTIN) 300 MG capsule TAKE 1 CAPSULE BY MOUTH AT BEDTIME. 90 capsule 0  . hydrOXYzine (ATARAX) 25 MG tablet Take 25 mg by mouth 3 (three) times daily as needed for itching.     . levocetirizine (XYZAL) 5 MG tablet Take 1 tablet (5 mg total) by mouth every evening. 90 tablet 3  . lubiprostone (AMITIZA) 24 MCG capsule Amitiza 24 mcg capsule  Take 1 capsule twice a day by oral route.    . magic mouthwash SOLN Take 5 mLs by mouth 4 (four) times daily as needed for mouth pain. Swish and Spit 240 mL 1  . magnesium oxide (MAG-OX 400) 400 MG tablet Take 400 mg by mouth 2 (two) times daily.     . meloxicam (MOBIC) 15 MG tablet Take 15 mg by mouth daily.  1  . montelukast (SINGULAIR) 10 MG tablet Take 10 mg by mouth at bedtime.     Marland Kitchen omeprazole (PRILOSEC) 20 MG capsule TAKE 1 CAPSULE BY MOUTH DAILY. (Patient taking differently: Take 20 mg by mouth at bedtime) 90 capsule 3  . ondansetron (ZOFRAN-ODT) 4 MG disintegrating tablet Take 1 tablet (4 mg total) by mouth 3 (three) times daily as needed for nausea or vomiting. 20 tablet 0  . oxyCODONE-acetaminophen (PERCOCET/ROXICET) 5-325 MG tablet   0  . phenazopyridine (PYRIDIUM) 200 MG tablet Take 1 tablet (200 mg total) by mouth 3 (three)  times daily as needed for pain. 10 tablet 0  . phenazopyridine (PYRIDIUM) 200 MG tablet phenazopyridine 200 mg tablet  TAKE 1 TABLET BY MOUTH THREE TIMES A DAY AS NEEDED FOR PAIN    . polyethylene glycol  powder (MIRALAX) powder Take 17 g by mouth daily. (Patient taking differently: Take 17 g by mouth daily as needed for mild constipation. ) 255 g 0  . potassium chloride SA (K-DUR,KLOR-CON) 20 MEQ tablet TAKE 1 TABLET BY MOUTH ONCE DAILY 90 tablet 1  . PX ENTERIC ASPIRIN 81 MG EC tablet Take 81 mg by mouth 2 (two) times a week.     . valACYclovir (VALTREX) 500 MG tablet Take 1 tablet (500 mg total) by mouth as needed. (Patient taking differently: Take 500 mg by mouth daily as needed (for flares of fever blisters). ) 30 tablet 5  . vitamin E 400 UNIT capsule Take 400 Units by mouth daily.      Marland Kitchen zolpidem (AMBIEN) 5 MG tablet TAKE 1 TABLET BY MOUTH AT BEDTIME 90 tablet 1   No current facility-administered medications on file prior to visit.     BP 112/63   Pulse 97   Temp 97.7 F (36.5 C) (Oral)   Resp 16   Ht 5\' 4"  (1.626 m)   Wt 184 lb (83.5 kg)   SpO2 99%   BMI 31.58 kg/m       Objective:   Physical Exam  General  Mental Status - Alert. General Appearance - Well groomed. Not in acute distress.  Skin Rashes- No Rashes.  HEENT Head- Normal. Ear Auditory Canal - Left- Normal. Right - Normal.Tympanic Membrane- Left- Normal. Right- Normal. Eye Sclera/Conjunctiva- Left- Normal. Right- Normal. Nose & Sinuses Nasal Mucosa- Left-  Boggy and Congested. Right-  Boggy and  Congested.Bilateral no  maxillary and  No frontal sinus pressure. Mouth & Throat Lips: Upper Lip- Normal: no dryness, cracking, pallor, cyanosis, or vesicular eruption. Lower Lip-Normal: no dryness, cracking, pallor, cyanosis or vesicular eruption. Buccal Mucosa- Bilateral- No Aphthous ulcers. Oropharynx- No Discharge or Erythema. Tonsils: Characteristics- Bilateral- No Erythema or Congestion. Size/Enlargement- Bilateral- No enlargement. Discharge- bilateral-None.  Neck Neck- Supple. No Masses.   Chest and Lung Exam Auscultation: Breath Sounds:-Clear even and unlabored.  Cardiovascular Auscultation:Rythm-  Regular, rate and rhythm. Murmurs & Other Heart Sounds:Ausculatation of the heart reveal- No Murmurs.  Lymphatic Head & Neck General Head & Neck Lymphatics: Bilateral: Description- No Localized lymphadenopathy.      Assessment & Plan:  You appear to have bronchitis. Rest hydrate and tylenol for fever. I am prescribing cough medicine benzonatate, and doxycycline antibiotic. For your nasal congestion can use nasocort.  You should gradually get better. If not then notify us and would recommend a chest xray.  For recent loose stools but also chronic hx of loose stools would recommend turning in gastropanel.  Follow up in 7-10 days or as needed  Note pt mentions historically that doxy does not upset stomach or cause her loose stools.  Mackie Pai, PA-C

## 2018-04-02 ENCOUNTER — Encounter: Payer: PPO | Admitting: Family Medicine

## 2018-04-02 ENCOUNTER — Other Ambulatory Visit: Payer: Self-pay | Admitting: Internal Medicine

## 2018-04-02 ENCOUNTER — Other Ambulatory Visit: Payer: PPO

## 2018-04-02 DIAGNOSIS — R197 Diarrhea, unspecified: Secondary | ICD-10-CM | POA: Diagnosis not present

## 2018-04-02 LAB — URINALYSIS, ROUTINE W REFLEX MICROSCOPIC
Bilirubin Urine: NEGATIVE
Ketones, ur: NEGATIVE
Nitrite: POSITIVE — AB
Specific Gravity, Urine: <=1.005 — AB (ref 1.000–1.030)
Total Protein, Urine: NEGATIVE
Urine Glucose: NEGATIVE
Urobilinogen, UA: 0.2 (ref 0.0–1.0)
pH: 6 (ref 5.0–8.0)

## 2018-04-02 MED ORDER — VITAMIN D (ERGOCALCIFEROL) 1.25 MG (50000 UNIT) PO CAPS: 50000.0000 [IU] | ORAL_CAPSULE | ORAL | 1 refills | Status: DC

## 2018-04-02 MED FILL — CYANOCOBALAMIN 1,000 MCG/ML: 1000 | 180 days supply | Qty: 6 | Fill #0

## 2018-04-02 MED FILL — GABAPENTIN 300 MG CAPSULE: 300 | 90 days supply | Qty: 90 | Fill #0

## 2018-04-02 NOTE — Telephone Encounter (Signed)
Refill x 1 year 

## 2018-04-02 NOTE — Telephone Encounter (Signed)
May I refill Sir? 

## 2018-04-02 NOTE — Telephone Encounter (Signed)
When I go to approve medication, there is a pop up message about switching due to not being on formulary    Please advise on alternative

## 2018-04-02 NOTE — Addendum Note (Signed)
Addended by: Magdalene Molly A on: 04/02/2018 07:37 AM   Modules accepted: Orders

## 2018-04-03 ENCOUNTER — Encounter: Payer: Self-pay | Admitting: Internal Medicine

## 2018-04-03 ENCOUNTER — Telehealth: Payer: Self-pay | Admitting: Internal Medicine

## 2018-04-03 MED FILL — MAGIC MOUTHWASH BOP FORM: 12 days supply | Qty: 240 | Fill #0

## 2018-04-03 MED FILL — ALENDRONATE NA 70 MG TAB: 70 | 28 days supply | Qty: 4 | Fill #0

## 2018-04-03 NOTE — Telephone Encounter (Signed)
Pt wants to discuss getting back on Hemocytes Plus.  Pt has been taking the generic but it's on back order.  Pt would like to discuss.

## 2018-04-04 MED ORDER — HEMOCYTE PLUS 106-1 MG PO CAPS: 1.0000 | ORAL_CAPSULE | Freq: Two times a day (BID) | ORAL | 5 refills | Status: DC

## 2018-04-04 MED FILL — HEMOCYTE PLUS CAPSULE: 106-1 | 30 days supply | Qty: 60 | Fill #0

## 2018-04-04 NOTE — ED Provider Notes (Signed)
[name]   [id number] 03/27/18 Arrival Time: 3428  ASSESSMENT & PLAN:  1. Urinary retention   2. Constipation, unspecified constipation type    Feeling much better after Foley catheter insertion. Over 2L urine collected. She prefers to leave catheter in overnight. Her daughter is a Marine scientist and will remove catheter tomorrow. Is taking Miralax and Magnesium Citrate to help with constipation. No indication at this time for emergent abdominal imaging.  Strict return precautions given. To ED with any worsening.  Reviewed expectations re: course of current medical issues. Questions answered. Outlined signs and symptoms indicating need for more acute intervention. Patient verbalized understanding. After Visit Summary given.   SUBJECTIVE: History from: patient. Alexis Olson is a 85 y.o. female who presents with complaint of persistent generalized abdominal discomfort. Onset gradual, over the past 24-48 hours.. Reports being constipated recently. Very small bowel movements today; some liquid. No bleeding with bowel movements. Reports not urinating since last evening. Bladder feeling very distended with increasing sharp pain. Two enemas today with stimulating larger bowel movement. No emesis. Mild nausea. Ambulatory without difficulty. Started Miralax yesterday.  No LMP recorded. Patient is postmenopausal.   Past Surgical History:  Procedure Laterality Date  . [place]   for ruptured disc  . CHOLECYSTECTOMY    . greenfield filter    . ileojejunal bypass  1976   for obesity  . LAPAROTOMY N/A 09/18/2014   Procedure: EXPLORATORY LAPAROTOMY WITH REPAIR OF CECIAL PERFORATION;  Surgeon: Autumn Messing III, MD;  Location: [facility];  Service: General;  Laterality: N/A;  . TONSILLECTOMY    . VASCULAR SURGERY Right    revascularization right lower extremity   ROS: As per HPI. All other systems negative.  OBJECTIVE:  Vitals:   03/27/18 1734  BP: 123/71  Pulse: (!) 105  Resp:  18  Temp: 98.8 F (37.1 C)  TempSrc: Oral  SpO2: 97%    Recheck pulse: 98  General appearance: appears uncomfortable Lungs: clear to auscultation bilaterally; unlabored respirations Heart: regular rate and rhythm Abdomen: soft; without distention; mild generalized tenderness to palpation; significant palpation over bladder; normal bowel sounds; without masses or organomegaly; without guarding or rebound tenderness Back: without CVA tenderness; FROM at waist Extremities: without LE edema; symmetrical; without gross deformities Skin: warm and dry Neurologic: normal gait Psychological: alert and cooperative; normal mood and affect  Allergies  Allergen Reactions  . Ativan [Lorazepam] Anaphylaxis and Other (See Comments)    Deathly allergic; "resp rate dropped dropped to 6"  . Other Anaphylaxis    Red peppers--choking, also  . Azithromycin Diarrhea  . Benadryl [Diphenhydramine Hcl] Other (See Comments)    Paradoxical response  . Ciprofloxacin Hcl Other (See Comments)    Causes yeast infection and refuses to take  . Codeine Phosphate Nausea Only    Can tolerate in limited amounts  . Hydrocodone Itching and Nausea And Vomiting  . Hydrocodone-Homatropine Itching and Nausea And Vomiting  . Levaquin [Levofloxacin In D5w] Other (See Comments)    Muscle soreness  . Levofloxacin   . Meperidine Hcl Other (See Comments)    Reaction ??  . Molds & Smuts Other (See Comments)    unsure  . Sulfa Antibiotics Other (See Comments)    Joint pain  . Tape Other (See Comments)    No Coban wrap (per the patient)  . Ultram [Tramadol Hcl] Other (See Comments)    "Can't move joints"  Past Medical History:  Diagnosis Date  . ALLERGIC RHINITIS   . ANEMIA, CHRONIC    Malabsorption related to bypass hx, B12 and iron deficiency  . ANXIETY   . ASTHMA   . Bariatric surgery status   . Colon perforation ([facility]) 08/2014   following polypetomy during colo,  surgical repair  . COLONIC POLYPS, ADENOMATOUS, HX OF 2010  . DIVERTICULOSIS, COLON   . GERD (gastroesophageal reflux disease) 08/23/2015  . HSV (herpes simplex virus) infection 08/16/2015  . Hypertension   . Hyponatremia 05/02/2016  . INSOMNIA   . Lumbosacral disc disease    Chronic pain; History of osteomyelitis 2010 following ESI complication  . Obesity   . Recurrent UTI 08/23/2015  . URINARY URGENCY   . VITAMIN B12 DEFICIENCY    Social History   Socioeconomic History  . Marital status: Married    Spouse name: Not on file  . Number of children: 3  . Years of education: Not on file  . Highest education level: Not on file  Occupational History  . Occupation: retired Programmer, multimedia: RETIRED  Social Needs  . Financial resource strain: Not on file  . Food insecurity:    Worry: Not on file    Inability: Not on file  . Transportation needs:    Medical: Not on file    Non-medical: Not on file  Tobacco Use  . Smoking status: Never Smoker  . Smokeless tobacco: Never Used  Substance and Sexual Activity  . Alcohol use: No    Alcohol/week: 0.0 standard drinks  . Drug use: No  . Sexual activity: Never    Partners: Male  Lifestyle  . Physical activity:    Days per week: Not on file    Minutes per session: Not on file  . Stress: Not on file  Relationships  . Social connections:    Talks on phone: Not on file    Gets together: Not on file    Attends religious service: Not on file    Active member of club or organization: Not on file    Attends meetings of clubs or organizations: Not on file    Relationship status: Not on file  . Intimate partner violence:    Fear of current or ex partner: Not on file    Emotionally abused: Not on file    Physically abused: Not on file    Forced sexual activity: Not on file  Other Topics Concern  . Not on file  Social History Narrative   Lives with spouse, Indep ADLs   Supportive family nearby   No dietary restrictions   Family History   Problem Relation Age of Onset  . Cervical cancer Mother   . Stroke Mother   . Liver disease Sister   . Kidney disease Sister   . Cirrhosis Sister   . Colon cancer Neg Hx   . Esophageal cancer Neg Hx   . Rectal cancer Neg Hx   . Stomach cancer Neg Hx      Vanessa Kick, MD 04/04/18 1511

## 2018-04-04 NOTE — Telephone Encounter (Signed)
Patient informed. 

## 2018-04-04 NOTE — Telephone Encounter (Signed)
  Per Barb Merino, CGRN okay to have brand name Hemocyte Plus. I will send this in as Amna has been out for 5 days and said she never runs out.

## 2018-04-05 ENCOUNTER — Telehealth: Payer: Self-pay | Admitting: Medical

## 2018-04-05 ENCOUNTER — Other Ambulatory Visit: Payer: PPO

## 2018-04-05 DIAGNOSIS — N390 Urinary tract infection, site not specified: Secondary | ICD-10-CM | POA: Diagnosis not present

## 2018-04-05 LAB — GASTROINTESTINAL PATHOGEN PANEL PCR
C. difficile Tox A/B, PCR: DETECTED — AB
Campylobacter, PCR: NOT DETECTED
Cryptosporidium, PCR: NOT DETECTED
E coli (ETEC) LT/ST PCR: NOT DETECTED
E coli (STEC) stx1/stx2, PCR: NOT DETECTED
E coli 0157, PCR: NOT DETECTED
Giardia lamblia, PCR: NOT DETECTED
Norovirus, PCR: NOT DETECTED
Rotavirus A, PCR: NOT DETECTED
Salmonella, PCR: NOT DETECTED
Shigella, PCR: NOT DETECTED

## 2018-04-05 MED ORDER — METRONIDAZOLE 500 MG PO TABS: 500.0000 mg | ORAL_TABLET | Freq: Three times a day (TID) | ORAL | 0 refills | Status: DC

## 2018-04-05 NOTE — Telephone Encounter (Signed)
Rx flagyl sent to pharmacist [facility]. Notified pt tonight on stool panel results.

## 2018-04-06 LAB — URINE CULTURE
MICRO NUMBER:: 39342
Result:: NO GROWTH
SPECIMEN QUALITY:: ADEQUATE

## 2018-04-08 MED ORDER — VITAMIN D (ERGOCALCIFEROL) 1.25 MG (50000 UNIT) PO CAPS: 50000.0000 [IU] | ORAL_CAPSULE | ORAL | 4 refills | Status: DC

## 2018-04-08 MED FILL — VIT D2 1.25 MG (50,000 UNIT: 1.25 MG | 84 days supply | Qty: 12 | Fill #0

## 2018-04-09 ENCOUNTER — Telehealth: Payer: Self-pay | Admitting: Internal Medicine

## 2018-04-09 NOTE — Telephone Encounter (Signed)
Patient states she needs medication lomotil refilled at [facility].

## 2018-04-10 DIAGNOSIS — M5416 Radiculopathy, lumbar region: Secondary | ICD-10-CM | POA: Diagnosis not present

## 2018-04-10 MED ORDER — DIPHENOXYLATE-ATROPINE 2.5-0.025 MG PO TABS: 2.0000 | ORAL_TABLET | Freq: Three times a day (TID) | ORAL | 0 refills | Status: DC | PRN

## 2018-04-10 MED FILL — DIPHENOXYLATE-ATROPINE 2.5-: 2.5-0.025 | 25 days supply | Qty: 150 | Fill #0

## 2018-04-10 MED FILL — OXYCODONE-ACETAMINOPHEN 5-3: 5-325 | 15 days supply | Qty: 60 | Fill #0

## 2018-04-10 NOTE — Telephone Encounter (Signed)
Okay Sir? 

## 2018-04-10 NOTE — Telephone Encounter (Signed)
She needs to see me or an APP  OK to refill enough until appointment

## 2018-04-10 NOTE — Telephone Encounter (Signed)
Refill for her Lomotil faxed to [facility] as requested and she will call back and make appointment. She was headed out the door to the Doctor.

## 2018-04-15 ENCOUNTER — Telehealth: Payer: Self-pay | Admitting: Family Medicine

## 2018-04-15 ENCOUNTER — Other Ambulatory Visit: Payer: Self-pay

## 2018-04-15 DIAGNOSIS — A0472 Enterocolitis due to Clostridium difficile, not specified as recurrent: Secondary | ICD-10-CM

## 2018-04-15 NOTE — Telephone Encounter (Signed)
I put in referral to gi. But pt has had c dif dx for about 2 weeks. Took meds. Still has 6-7 loose stools. I recommend ED evaluation. Likely dehydrated. May need fluids. May have low potassium.  If she refuses then see Dr. Charlett Blake or me tomorrow.  Not sure refill of flagyl will work. Maybe resistant infection

## 2018-04-15 NOTE — Telephone Encounter (Signed)
Copied from [facility]. Topic: General - Other >> Apr 12, 2018  3:06 PM [name] wrote: Reason for CRM: pt calling stating that Alexis Olson wanted her to call to let him know how she was doing she states that she is still having watery diarrhea and that she has been to bathroom 6-7 times today she was going more before appt she is still taking the flagyl

## 2018-04-15 NOTE — Telephone Encounter (Signed)
Declined refill ambien  since you mentioned Dr. Charlett Blake working from home.

## 2018-04-15 NOTE — Telephone Encounter (Signed)
Requesting: ambien Contract:no UDS:low risk next screen 02/13/18 Last OV:04/01/18 w/Edward 03/26/18 w/You Next OV:04/17/18 with Percell Miller Last Refill: 10/15/17 #[id number] Database:   Please advise

## 2018-04-15 NOTE — Telephone Encounter (Signed)
Pt states that she only has 3 pills of her ambien left and needs the refill is possible. Also needing flagyl refilled per pt.

## 2018-04-15 NOTE — Telephone Encounter (Signed)
She cannot have refill on Ambien until she has been evaluated

## 2018-04-16 NOTE — Telephone Encounter (Signed)
Called patient and let her know that Dr. Charlett Blake stated she can not have refill until she evaluated by Percell Miller on 04/17/18 Left message for patient to call the office back

## 2018-04-16 NOTE — Telephone Encounter (Signed)
Called left message for patient to call the office back.  Patient needs to know Dr. Charlett Blake stated she can not have a refill on her Ambien until she is evaluated by Percell Miller on 04/17/18.   Nurse triage may handle

## 2018-04-17 ENCOUNTER — Ambulatory Visit: Payer: PPO | Admitting: Medical

## 2018-04-18 ENCOUNTER — Encounter: Payer: Self-pay | Admitting: Medical

## 2018-04-18 ENCOUNTER — Ambulatory Visit (INDEPENDENT_AMBULATORY_CARE_PROVIDER_SITE_OTHER): Payer: PPO | Admitting: Medical

## 2018-04-18 ENCOUNTER — Other Ambulatory Visit: Payer: Self-pay

## 2018-04-18 VITALS — Resp 16 | Ht 64.0 in | Wt 187.8 lb

## 2018-04-18 DIAGNOSIS — R197 Diarrhea, unspecified: Secondary | ICD-10-CM | POA: Diagnosis not present

## 2018-04-18 DIAGNOSIS — G4700 Insomnia, unspecified: Secondary | ICD-10-CM

## 2018-04-18 DIAGNOSIS — Z79899 Other long term (current) drug therapy: Secondary | ICD-10-CM | POA: Diagnosis not present

## 2018-04-18 MED ORDER — ZOLPIDEM TARTRATE 5 MG PO TABS: 5.0000 mg | ORAL_TABLET | Freq: Every day | ORAL | 1 refills | Status: DC

## 2018-04-18 MED ORDER — VANCOMYCIN HCL 125 MG PO CAPS: 125.0000 mg | ORAL_CAPSULE | Freq: Four times a day (QID) | ORAL | 0 refills | Status: DC

## 2018-04-18 NOTE — Progress Notes (Signed)
Subjective:    Patient ID: Alexis Olson, female    DOB: 01-Aug-1933, 85 y.o.   MRN: [id number]  HPI  Pt in for c dif follow up. Last I talked with her she had diarrhea.  Pt has been hydrating with propel and gatorade.  Pt has started lomotil twice daily since Mar 27, 2017 at the  time that diarrhea started.   Pt states diarrhea was about 8-9 times a day. Pt states now only having 4 loose stools a day. Still watery.   No fever, no chills or sweats. Then states sometimes feels cold at times.  Pt was feeling real tired but recent energy increased last 2 days.  Pt does give history of some baseline loose stools. So in light of that she  had subtle presentation before c dif discovered.   Review of Systems  Constitutional: Negative for chills, fatigue and fever.  Respiratory: Negative for cough, choking, shortness of breath and wheezing.   Cardiovascular: Negative for chest pain and palpitations.  Gastrointestinal: Positive for diarrhea. Negative for abdominal pain, constipation, nausea and vomiting.  Musculoskeletal: Negative for back pain.  Skin: Negative for rash.  Hematological: Negative for adenopathy. Does not bruise/bleed easily.  Psychiatric/Behavioral: Negative for behavioral problems and confusion.   Past Medical History:  Diagnosis Date  . ALLERGIC RHINITIS   . ANEMIA, CHRONIC    Malabsorption related to bypass hx, B12 and iron deficiency  . ANXIETY   . ASTHMA   . Bariatric surgery status   . Colon perforation ([place]) 08/2014   following polypetomy during colo, surgical repair  . COLONIC POLYPS, ADENOMATOUS, HX OF 2010  . DIVERTICULOSIS, COLON   . GERD (gastroesophageal reflux disease) 08/23/2015  . HSV (herpes simplex virus) infection 08/16/2015  . Hypertension   . Hyponatremia 05/02/2016  . INSOMNIA   . Lumbosacral disc disease    Chronic pain; History of osteomyelitis 2010 following ESI complication  . Obesity   . Recurrent UTI 08/23/2015  . URINARY URGENCY   .  VITAMIN B12 DEFICIENCY      Social History   Socioeconomic History  . Marital status: Married    Spouse name: Not on file  . Number of children: 3  . Years of education: Not on file  . Highest education level: Not on file  Occupational History  . Occupation: retired Programmer, multimedia: RETIRED  Social Needs  . Financial resource strain: Not on file  . Food insecurity:    Worry: Not on file    Inability: Not on file  . Transportation needs:    Medical: Not on file    Non-medical: Not on file  Tobacco Use  . Smoking status: Never Smoker  . Smokeless tobacco: Never Used  Substance and Sexual Activity  . Alcohol use: No    Alcohol/week: 0.0 standard drinks  . Drug use: No  . Sexual activity: Never    Partners: Male  Lifestyle  . Physical activity:    Days per week: Not on file    Minutes per session: Not on file  . Stress: Not on file  Relationships  . Social connections:    Talks on phone: Not on file    Gets together: Not on file    Attends religious service: Not on file    Active member of club or organization: Not on file    Attends meetings of clubs or organizations: Not on file    Relationship status: Not on file  . Intimate  partner violence:    Fear of current or ex partner: Not on file    Emotionally abused: Not on file    Physically abused: Not on file    Forced sexual activity: Not on file  Other Topics Concern  . Not on file  Social History Narrative   Lives with spouse, Indep ADLs   Supportive family nearby   No dietary restrictions    Past Surgical History:  Procedure Laterality Date  . [place]   for ruptured disc  . CHOLECYSTECTOMY    . [place] filter    . ileojejunal bypass  1976   for obesity  . LAPAROTOMY N/A 09/18/2014   Procedure: EXPLORATORY LAPAROTOMY WITH REPAIR OF CECIAL PERFORATION;  Surgeon: Autumn Messing III, MD;  Location: [facility];  Service: General;  Laterality: N/A;  . TONSILLECTOMY    . VASCULAR SURGERY Right     revascularization right lower extremity    Family History  Problem Relation Age of Onset  . Cervical cancer Mother   . Stroke Mother   . Liver disease Sister   . Kidney disease Sister   . Cirrhosis Sister   . Colon cancer Neg Hx   . Esophageal cancer Neg Hx   . Rectal cancer Neg Hx   . Stomach cancer Neg Hx     Allergies  Allergen Reactions  . Ativan [Lorazepam] Anaphylaxis and Other (See Comments)    Deathly allergic; "resp rate dropped dropped to 6"  . Other Anaphylaxis    Red peppers--choking, also  . Azithromycin Diarrhea  . Benadryl [Diphenhydramine Hcl] Other (See Comments)    Paradoxical response  . Ciprofloxacin Hcl Other (See Comments)    Causes yeast infection and refuses to take  . Codeine Phosphate Nausea Only    Can tolerate in limited amounts  . Hydrocodone Itching and Nausea And Vomiting  . Hydrocodone-Homatropine Itching and Nausea And Vomiting  . Levaquin [Levofloxacin In D5w] Other (See Comments)    Muscle soreness  . Levofloxacin   . Meperidine Hcl Other (See Comments)    Reaction ??  . Molds & Smuts Other (See Comments)    unsure  . Sulfa Antibiotics Other (See Comments)    Joint pain  . Tape Other (See Comments)    No Coban wrap (per the patient)  . Ultram [Tramadol Hcl] Other (See Comments)    "Can't move joints"    Current Outpatient Medications on File Prior to Visit  Medication Sig Dispense Refill  . acetaminophen-codeine (TYLENOL #3) 300-30 MG tablet Take 1 tablet by mouth every 4 (four) hours as needed for moderate pain or severe pain. 15 tablet 0  . albuterol (PROVENTIL HFA;VENTOLIN HFA) 108 (90 BASE) MCG/ACT inhaler Inhale 1-2 puffs into the lungs every 6 (six) hours as needed for wheezing or shortness of breath.    Marland Kitchen alendronate (FOSAMAX) 70 MG tablet Take 1 tablet (70 mg total) by mouth every 7 (seven) days. Take with a full glass of water on an empty stomach. 4 tablet 5  . ALPRAZolam (XANAX) 1 MG tablet TAKE 1/2 TO 1 TABLET BY MOUTH  AT BEDTIME AS NEEDED 90 tablet 1  . amitriptyline (ELAVIL) 100 MG tablet TAKE 1 TABLET (100 MG TOTAL) BY MOUTH DAILY. 90 tablet 1  . azelastine (OPTIVAR) 0.05 % ophthalmic solution Place 1 drop into both eyes daily as needed (for dry eyes).     . benzonatate (TESSALON) 100 MG capsule Take 1 capsule (100 mg total) by mouth 3 (three)  times daily as needed for cough. 30 capsule 0  . budesonide-formoterol (SYMBICORT) 160-4.5 MCG/ACT inhaler Inhale 2 puffs into the lungs See admin instructions. Inhale 2 puffs into the lungs one to two times a day 1 Inhaler 2  . cetirizine (ZYRTEC) 10 MG tablet Take 10 mg by mouth daily.     . cholecalciferol (VITAMIN D) 1000 UNITS tablet Take 1,000 Units by mouth daily.    . cyanocobalamin (,VITAMIN B-12,) 1000 MCG/ML injection INJECT 1 ML INTO THE MUSCLE EVERY 30 DAYS. 6 mL 1  . diclofenac sodium (VOLTAREN) 1 % GEL Apply 2-4 g topically See admin instructions. Apply 2-4 grams to both knees once a day  0  . dicyclomine (BENTYL) 10 MG capsule Take 1 capsule (10 mg total) by mouth 3 (three) times daily. (Patient taking differently: Take 10 mg by mouth daily as needed (for diarrhea & symptoms). ) 270 capsule 3  . Diphenhyd-Hydrocort-Nystatin (FIRST-DUKES MOUTHWASH) SUSP Take 1 to 2 teaspoonfuls every 4 to 6 hours as directed. 120 mL 2  . diphenoxylate-atropine (LOMOTIL) 2.5-0.025 MG tablet Take 2 tablets by mouth 3 (three) times daily as needed for diarrhea or loose stools. 150 tablet 0  . doxycycline (VIBRA-TABS) 100 MG tablet Take 1 tablet (100 mg total) by mouth 2 (two) times daily. 20 tablet 0  . doxycycline (VIBRA-TABS) 100 MG tablet Take 1 tablet (100 mg total) by mouth 2 (two) times daily. Can give caps or generic. 14 tablet 0  . Fe Fum-FA-B Cmp-C-Zn-Mg-Mn-Cu (CENTRATEX) 106-1 MG CAPS TAKE 1 CAPSULE BY MOUTH TWICE DAILY 60 each 2  . Fe Fum-FA-B Cmp-C-Zn-Mg-Mn-Cu (HEMOCYTE PLUS) 106-1 MG CAPS Take 1 capsule by mouth 2 (two) times daily. 60 each 5  . fenofibrate  micronized (ANTARA) 130 MG capsule Take 1 capsule (130 mg total) by mouth daily. 90 capsule 0  . fluticasone (FLONASE) 50 MCG/ACT nasal spray 2 sprays by Nasal route daily.      Marland Kitchen gabapentin (NEURONTIN) 300 MG capsule TAKE 1 CAPSULE BY MOUTH AT BEDTIME. 90 capsule 0  . hydrOXYzine (ATARAX) 25 MG tablet Take 25 mg by mouth 3 (three) times daily as needed for itching.     . levocetirizine (XYZAL) 5 MG tablet Take 1 tablet (5 mg total) by mouth every evening. 90 tablet 3  . lubiprostone (AMITIZA) 24 MCG capsule Amitiza 24 mcg capsule  Take 1 capsule twice a day by oral route.    . magic mouthwash SOLN Take 5 mLs by mouth 4 (four) times daily as needed for mouth pain. Swish and Spit 240 mL 1  . magnesium oxide (MAG-OX 400) 400 MG tablet Take 400 mg by mouth 2 (two) times daily.     . meloxicam (MOBIC) 15 MG tablet Take 15 mg by mouth daily.  1  . metroNIDAZOLE (FLAGYL) 500 MG tablet Take 1 tablet (500 mg total) by mouth 3 (three) times daily. 30 tablet 0  . montelukast (SINGULAIR) 10 MG tablet Take 10 mg by mouth at bedtime.     Marland Kitchen omeprazole (PRILOSEC) 20 MG capsule TAKE 1 CAPSULE BY MOUTH DAILY. (Patient taking differently: Take 20 mg by mouth at bedtime) 90 capsule 3  . ondansetron (ZOFRAN-ODT) 4 MG disintegrating tablet Take 1 tablet (4 mg total) by mouth 3 (three) times daily as needed for nausea or vomiting. 20 tablet 0  . oxyCODONE-acetaminophen (PERCOCET/ROXICET) 5-325 MG tablet   0  . phenazopyridine (PYRIDIUM) 200 MG tablet Take 1 tablet (200 mg total) by mouth 3 (three) times daily as  needed for pain. 10 tablet 0  . phenazopyridine (PYRIDIUM) 200 MG tablet phenazopyridine 200 mg tablet  TAKE 1 TABLET BY MOUTH THREE TIMES A DAY AS NEEDED FOR PAIN    . polyethylene glycol powder (MIRALAX) powder Take 17 g by mouth daily. (Patient taking differently: Take 17 g by mouth daily as needed for mild constipation. ) 255 g 0  . potassium chloride SA (K-DUR,KLOR-CON) 20 MEQ tablet TAKE 1 TABLET BY MOUTH  ONCE DAILY 90 tablet 1  . PX ENTERIC ASPIRIN 81 MG EC tablet Take 81 mg by mouth 2 (two) times a week.     . valACYclovir (VALTREX) 500 MG tablet Take 1 tablet (500 mg total) by mouth as needed. (Patient taking differently: Take 500 mg by mouth daily as needed (for flares of fever blisters). ) 30 tablet 5  . Vitamin D, Ergocalciferol, (DRISDOL) 1.25 MG (50000 UT) CAPS capsule Take 1 capsule (50,000 Units total) by mouth every 7 (seven) days. 12 capsule 4  . vitamin E 400 UNIT capsule Take 400 Units by mouth daily.      Marland Kitchen zolpidem (AMBIEN) 5 MG tablet TAKE 1 TABLET BY MOUTH AT BEDTIME 90 tablet 1   No current facility-administered medications on file prior to visit.     Resp 16   Ht 5\' 4"  (1.626 m)   Wt 187 lb 12.8 oz (85.2 kg)   SpO2 98%   BMI 32.24 kg/m       Objective:   Physical Exam  General Appearance- Not in acute distress.  HEENT Eyes- Scleraeral/Conjuntiva-bilat- Not Yellow. Mouth & Throat- Normal.  Chest and Lung Exam Auscultation: Breath sounds:-Normal. Adventitious sounds:- No Adventitious sounds.  Cardiovascular Auscultation:Rythm - Regular. Heart Sounds -Normal heart sounds.  Abdomen Inspection:-Inspection Normal.  Palpation/Perucssion: Palpation and Percussion of the abdomen reveal- Non Tender, No Rebound tenderness, No rigidity(Guarding) and No Palpable abdominal masses.  Liver:-Normal.  Spleen:- Normal.   Back- no cva tenderness.      Assessment & Plan:  For your history of recent seizures with persisting loose stools despite using Flagyl, we decided to give the vancomycin.  Prescription sent to your pharmacy.  Eat bland foods and make sure well-hydrated.  You got CMP and CBC today.  We will let you know those results.  You do have baseline loose stools even before C. difficile diagnosis.  If within a days you still have slightly loose stool then will need to repeat gastro panel.  If your stools are completely formed/solid then then repeat testing  will be necessary and would be impossible.  For insomnia, you signed controlled medication agreement.  Dr. Charlett Blake is aware of your Ambien request and she should be setting that and tomorrow.  Follow-up in 8 days or as needed.  Mackie Pai, PA-C

## 2018-04-18 NOTE — Patient Instructions (Signed)
For your history of recent seizures with persisting loose stools despite using Flagyl, we decided to give the vancomycin.  Prescription sent to your pharmacy.  Eat bland foods and make sure well-hydrated.  You got CMP and CBC today.  We will let you know those results.  You do have baseline loose stools even before C. difficile diagnosis.  If within a days you still have slightly loose stool then will need to repeat gastro panel.  If your stools are completely formed/solid then then repeat testing will be necessary and would be impossible.  For insomnia, you signed controlled medication agreement.  Dr. Charlett Blake is aware of your Ambien request and she should be setting that and tomorrow.  Follow-up in 8 days or as needed.

## 2018-04-18 NOTE — Telephone Encounter (Signed)
Refill request:Zolpidem  Last RX:10/15/17 Last OV:04/18/18 Next OV: UDS:02/13/17 CSC:02/13/17 CSR:

## 2018-04-19 LAB — COMPREHENSIVE METABOLIC PANEL
ALT: 13 U/L (ref 0–35)
AST: 17 U/L (ref 0–37)
Albumin: 3.9 g/dL (ref 3.5–5.2)
Alkaline Phosphatase: 44 U/L (ref 39–117)
BUN: 11 mg/dL (ref 6–23)
CO2: 30 mEq/L (ref 19–32)
Calcium: 8.9 mg/dL (ref 8.4–10.5)
Chloride: 96 mEq/L (ref 96–112)
Creatinine, Ser: 0.55 mg/dL (ref 0.40–1.20)
GFR: 105.57 mL/min (ref >60.00)
Glucose, Bld: 82 mg/dL (ref 70–99)
Potassium: 3.9 mEq/L (ref 3.5–5.1)
Sodium: 132 mEq/L — ABNORMAL LOW (ref 135–145)
Total Bilirubin: 0.3 mg/dL (ref 0.2–1.2)
Total Protein: 6.2 g/dL (ref 6.0–8.3)

## 2018-04-19 LAB — CBC
HCT: 35.3 % — ABNORMAL LOW (ref 36.0–46.0)
Hemoglobin: 11.7 g/dL — ABNORMAL LOW (ref 12.0–15.0)
MCHC: 33.1 g/dL (ref 30.0–36.0)
MCV: 94.7 fl (ref 78.0–100.0)
Platelets: 302 10*3/uL (ref 150.0–400.0)
RBC: 3.73 Mil/uL — ABNORMAL LOW (ref 3.87–5.11)
RDW: 14.9 % (ref 11.5–15.5)
WBC: 7.4 10*3/uL (ref 4.0–10.5)

## 2018-04-19 MED FILL — VANCOMYCIN 50MG/ML ORAL SOL: 80 days supply | Qty: 80 | Fill #0

## 2018-04-19 MED FILL — ZOLPIDEM TARTRATE 5 MG TAB: 5 | 90 days supply | Qty: 90 | Fill #0

## 2018-04-26 ENCOUNTER — Telehealth: Payer: Self-pay | Admitting: Medical

## 2018-04-26 ENCOUNTER — Other Ambulatory Visit: Payer: Self-pay | Admitting: Family Medicine

## 2018-04-26 DIAGNOSIS — A0472 Enterocolitis due to Clostridium difficile, not specified as recurrent: Secondary | ICD-10-CM

## 2018-04-26 NOTE — Telephone Encounter (Signed)
Pt states she is hoping to hear back from [facility] today

## 2018-04-26 NOTE — Telephone Encounter (Signed)
Copied from [place]. Topic: Quick Communication - See Telephone Encounter >> Apr 26, 2018 10:42 AM Ahmed Prima L wrote: CRM for notification. See Telephone encounter for: 04/26/18.  Patient said she was suppose to call Percell Miller by today regarding her Diarrhea. She said she has been taking vancomycin (VANCOCIN) 125 MG capsule and is almost out. She said the diarrhea is somewhat better.

## 2018-04-26 NOTE — Telephone Encounter (Signed)
Please check with her and see how she is doing. If she was improving on Vancomycin and now is worsening again am willing to refill vancomycin for another week

## 2018-04-29 MED FILL — MELOXICAM 15 MG TABLET: 15 | 30 days supply | Qty: 30 | Fill #0

## 2018-04-29 NOTE — Telephone Encounter (Signed)
Patient waiting for a call to speak to [facility] regarding her Diarrhea. And Vancomycin.  Please advise thank you [phone]

## 2018-04-30 ENCOUNTER — Telehealth: Payer: Self-pay

## 2018-04-30 NOTE — Telephone Encounter (Signed)
Pt states she is still having some diarrhea but is better. Pt finished her medicine and wants to be sure C. Diff is gone.

## 2018-04-30 NOTE — Telephone Encounter (Signed)
Copied from CRM #[id number]. Topic: Quick Communication - See Telephone Encounter >> Apr 26, 2018 10:42 AM Taylor, Brittany L wrote: CRM for notification. See Telephone encounter for: 04/26/18.  Patient said she was suppose to call Edward by today regarding her Diarrhea. She said she has been taking vancomycin (VANCOCIN) 125 MG capsule and is almost out. She said the diarrhea is somewhat better. >> Apr 29, 2018  4:49 PM Demaray, Melissa wrote: Patient states that she has finished her medication and she is still having diareah  Please advise  Best call back is [phone] >> Apr 30, 2018  4:19 PM Davis, Karen A wrote: Patient called to say that she is still waiting for a call back from Edward Saguier or his assistant. She stated that several weeks ago when she was seen in the office he gave her stool sample kit but did not tell her to collect the sample and return it. She would like to know what should she do asking for a call back today if possible.  Ph# [phone] 

## 2018-04-30 NOTE — Telephone Encounter (Signed)
Are you putting in the  C dif order? Will you coordinate with patient.?  Thanks, Percell Miller

## 2018-04-30 NOTE — Telephone Encounter (Signed)
She has future order gastro panel placed. Dr. Charlett Blake saw her request/call and agreed she can repeat gastro panel/repeat test for c dif. Will you call and notify her. I think she already has stool panel kit?

## 2018-04-30 NOTE — Telephone Encounter (Signed)
Have her repeat a Cdiff stool sample

## 2018-05-02 ENCOUNTER — Telehealth: Payer: Self-pay | Admitting: Medical

## 2018-05-02 ENCOUNTER — Other Ambulatory Visit: Payer: PPO

## 2018-05-02 ENCOUNTER — Telehealth: Payer: Self-pay

## 2018-05-02 DIAGNOSIS — A0472 Enterocolitis due to Clostridium difficile, not specified as recurrent: Secondary | ICD-10-CM

## 2018-05-02 DIAGNOSIS — R197 Diarrhea, unspecified: Secondary | ICD-10-CM | POA: Diagnosis not present

## 2018-05-02 NOTE — Telephone Encounter (Signed)
Pt.notified

## 2018-05-02 NOTE — Telephone Encounter (Signed)
She was supposed to repeat gastro panel.

## 2018-05-02 NOTE — Telephone Encounter (Signed)
Copied from [place] [id number]. Topic: General - Other >> May 02, 2018  3:47 PM Bea Graff, NT wrote: Reason for CRM: Pt calling to let the office know she is on the way with her lab specimen. Pt did not verify which kind of specimen.

## 2018-05-02 NOTE — Telephone Encounter (Signed)
Pt was seen last week for diarrhea. Alexis Olson is this a stool sample?

## 2018-05-02 NOTE — Telephone Encounter (Signed)
She was supposed to repeat  gastropanel.

## 2018-05-02 NOTE — Telephone Encounter (Signed)
This is a stool sample, patient has already come.

## 2018-05-10 MED FILL — LEVOCETIRIZINE 5 MG TABLET: 5 | 90 days supply | Qty: 90 | Fill #2

## 2018-05-10 MED FILL — MONTELUKAST SOD 10 MG TAB: 10 | 90 days supply | Qty: 90 | Fill #2

## 2018-05-11 LAB — GASTROINTESTINAL PATHOGEN PANEL PCR
C. difficile Tox A/B, PCR: NOT DETECTED
Campylobacter, PCR: NOT DETECTED
Cryptosporidium, PCR: NOT DETECTED
E coli (ETEC) LT/ST PCR: NOT DETECTED
E coli (STEC) stx1/stx2, PCR: NOT DETECTED
E coli 0157, PCR: NOT DETECTED
Giardia lamblia, PCR: NOT DETECTED
Norovirus, PCR: NOT DETECTED
Rotavirus A, PCR: NOT DETECTED
Salmonella, PCR: NOT DETECTED
Shigella, PCR: NOT DETECTED

## 2018-05-11 LAB — CLOSTRIDIUM DIFFICILE CULTURE-FECAL

## 2018-05-21 ENCOUNTER — Encounter: Payer: Self-pay | Admitting: Internal Medicine

## 2018-05-21 ENCOUNTER — Ambulatory Visit: Payer: PPO | Admitting: Internal Medicine

## 2018-05-21 ENCOUNTER — Ambulatory Visit (INDEPENDENT_AMBULATORY_CARE_PROVIDER_SITE_OTHER): Payer: PPO | Admitting: Internal Medicine

## 2018-05-21 VITALS — BP 104/70 | HR 100 | Ht 64.0 in | Wt 186.1 lb

## 2018-05-21 DIAGNOSIS — K439 Ventral hernia without obstruction or gangrene: Secondary | ICD-10-CM

## 2018-05-21 DIAGNOSIS — K582 Mixed irritable bowel syndrome: Secondary | ICD-10-CM | POA: Diagnosis not present

## 2018-05-21 NOTE — Progress Notes (Signed)
Alexis Olson 85 y.o. 08/31/1933 [id number]  Assessment & Plan:   Encounter Diagnoses  Name Primary?  . Ventral hernia without obstruction or gangrene Yes  . Irritable bowel syndrome with both constipation and diarrhea     Reassured and educated about hernia   RTC PRN    Subjective:   Chief Complaint: Bulge in abdomen HPI Alexis Olson had recent difficulties with diarrhea and tested + for C diff on GI pathogen panel - she was treated and recovered. Also had some problems with constipation in January and went to urgent care and had a firm bulge in left lower abdomen. Still there but not firm now that she is moving bowels regularly again. Had a KUB that day - large fecal burden. Had pelvic CT in summer when she fell and fx pelvis - there is a ventral hernia seen in this spot. Reviewed images with her.  She has unstable gait due to OA of knees and needs bilateral TKR but "I haven't gotten up the nerve yet".    Allergies  Allergen Reactions  . Ativan [Lorazepam] Anaphylaxis and Other (See Comments)    Deathly allergic; "resp rate dropped dropped to 6"  . Other Anaphylaxis    Red peppers--choking, also  . Azithromycin Diarrhea  . Benadryl [Diphenhydramine Hcl] Other (See Comments)    Paradoxical response  . Ciprofloxacin Hcl Other (See Comments)    Causes yeast infection and refuses to take  . Codeine Phosphate Nausea Only    Can tolerate in limited amounts  . Hydrocodone Itching and Nausea And Vomiting  . Hydrocodone-Homatropine Itching and Nausea And Vomiting  . Levaquin [Levofloxacin In D5w] Other (See Comments)    Muscle soreness  . Levofloxacin   . Meperidine Hcl Other (See Comments)    Reaction ??  . Molds & Smuts Other (See Comments)    unsure  . Sulfa Antibiotics Other (See Comments)    Joint pain  . Tape Other (See Comments)    No Coban wrap (per the patient)  . Ultram [Tramadol Hcl] Other (See Comments)    "Can't move joints"   Current Meds  Medication  Sig  . acetaminophen-codeine (TYLENOL #3) 300-30 MG tablet Take 1 tablet by mouth every 4 (four) hours as needed for moderate pain or severe pain.  Marland Kitchen albuterol (PROVENTIL HFA;VENTOLIN HFA) 108 (90 BASE) MCG/ACT inhaler Inhale 1-2 puffs into the lungs every 6 (six) hours as needed for wheezing or shortness of breath.  Marland Kitchen alendronate (FOSAMAX) 70 MG tablet Take 1 tablet (70 mg total) by mouth every 7 (seven) days. Take with a full glass of water on an empty stomach.  . ALPRAZolam (XANAX) 1 MG tablet TAKE 1/2 TO 1 TABLET BY MOUTH AT BEDTIME AS NEEDED  . amitriptyline (ELAVIL) 100 MG tablet TAKE 1 TABLET (100 MG TOTAL) BY MOUTH DAILY.  Marland Kitchen azelastine (OPTIVAR) 0.05 % ophthalmic solution Place 1 drop into both eyes daily as needed (for dry eyes).   . benzonatate (TESSALON) 100 MG capsule Take 1 capsule (100 mg total) by mouth 3 (three) times daily as needed for cough.  . budesonide-formoterol (SYMBICORT) 160-4.5 MCG/ACT inhaler Inhale 2 puffs into the lungs See admin instructions. Inhale 2 puffs into the lungs one to two times a day  . cetirizine (ZYRTEC) 10 MG tablet Take 10 mg by mouth daily.   . cholecalciferol (VITAMIN D) 1000 UNITS tablet Take 1,000 Units by mouth daily.  . cyanocobalamin (,VITAMIN B-12,) 1000 MCG/ML injection INJECT 1 ML INTO THE  MUSCLE EVERY 30 DAYS.  Marland Kitchen diclofenac sodium (VOLTAREN) 1 % GEL Apply 2-4 g topically See admin instructions. Apply 2-4 grams to both knees once a day  . dicyclomine (BENTYL) 10 MG capsule Take 1 capsule (10 mg total) by mouth 3 (three) times daily. (Patient taking differently: Take 10 mg by mouth daily as needed (for diarrhea & symptoms). )  . Diphenhyd-Hydrocort-Nystatin (FIRST-DUKES MOUTHWASH) SUSP Take 1 to 2 teaspoonfuls every 4 to 6 hours as directed.  . diphenoxylate-atropine (LOMOTIL) 2.5-0.025 MG tablet Take 2 tablets by mouth 3 (three) times daily as needed for diarrhea or loose stools.  . Fe Fum-FA-B Cmp-C-Zn-Mg-Mn-Cu (HEMOCYTE PLUS) 106-1 MG CAPS  Take 1 capsule by mouth 2 (two) times daily.  . fluticasone (FLONASE) 50 MCG/ACT nasal spray 2 sprays by Nasal route daily.    Marland Kitchen gabapentin (NEURONTIN) 300 MG capsule TAKE 1 CAPSULE BY MOUTH AT BEDTIME.  . hydrOXYzine (ATARAX) 25 MG tablet Take 25 mg by mouth 3 (three) times daily as needed for itching.   . levocetirizine (XYZAL) 5 MG tablet Take 1 tablet (5 mg total) by mouth every evening.  . magic mouthwash SOLN Take 5 mLs by mouth 4 (four) times daily as needed for mouth pain. Swish and Spit  . magnesium oxide (MAG-OX 400) 400 MG tablet Take 400 mg by mouth 2 (two) times daily.   . meloxicam (MOBIC) 15 MG tablet Take 15 mg by mouth daily.  . montelukast (SINGULAIR) 10 MG tablet Take 10 mg by mouth at bedtime.   Marland Kitchen omeprazole (PRILOSEC) 20 MG capsule TAKE 1 CAPSULE BY MOUTH DAILY. (Patient taking differently: Take 20 mg by mouth at bedtime)  . ondansetron (ZOFRAN-ODT) 4 MG disintegrating tablet Take 1 tablet (4 mg total) by mouth 3 (three) times daily as needed for nausea or vomiting.  Marland Kitchen oxyCODONE-acetaminophen (PERCOCET/ROXICET) 5-325 MG tablet   . phenazopyridine (PYRIDIUM) 200 MG tablet phenazopyridine 200 mg tablet  TAKE 1 TABLET BY MOUTH THREE TIMES A DAY AS NEEDED FOR PAIN  . polyethylene glycol powder (MIRALAX) powder Take 17 g by mouth daily. (Patient taking differently: Take 17 g by mouth daily as needed for mild constipation. )  . potassium chloride SA (K-DUR,KLOR-CON) 20 MEQ tablet TAKE 1 TABLET BY MOUTH ONCE DAILY  . PX ENTERIC ASPIRIN 81 MG EC tablet Take 81 mg by mouth 2 (two) times a week.   . valACYclovir (VALTREX) 500 MG tablet Take 1 tablet (500 mg total) by mouth as needed. (Patient taking differently: Take 500 mg by mouth daily as needed (for flares of fever blisters). )  . Vitamin D, Ergocalciferol, (DRISDOL) 1.25 MG (50000 UT) CAPS capsule Take 1 capsule (50,000 Units total) by mouth every 7 (seven) days.  . vitamin E 400 UNIT capsule Take 400 Units by mouth daily.    Marland Kitchen  zolpidem (AMBIEN) 5 MG tablet Take 1 tablet (5 mg total) by mouth at bedtime.   Past Medical History:  Diagnosis Date  . ALLERGIC RHINITIS   . ANEMIA, CHRONIC    Malabsorption related to bypass hx, B12 and iron deficiency  . ANXIETY   . ASTHMA   . Bariatric surgery status   . C. difficile diarrhea   . Colon perforation ([place]) 08/2014   following polypetomy during colo, surgical repair  . COLONIC POLYPS, ADENOMATOUS, HX OF 2010  . DIVERTICULOSIS, COLON   . GERD (gastroesophageal reflux disease) 08/23/2015  . HSV (herpes simplex virus) infection 08/16/2015  . Hypertension   . Hyponatremia 05/02/2016  . INSOMNIA   .  Lumbosacral disc disease    Chronic pain; History of osteomyelitis 2010 following ESI complication  . Obesity   . Pelvic fracture ([place])   . Recurrent UTI 08/23/2015  . URINARY URGENCY   . VITAMIN B12 DEFICIENCY    Past Surgical History:  Procedure Laterality Date  . [place]   for ruptured disc  . CHOLECYSTECTOMY    . greenfield filter    . ileojejunal bypass  1976   for obesity  . LAPAROTOMY N/A 09/18/2014   Procedure: EXPLORATORY LAPAROTOMY WITH REPAIR OF CECIAL PERFORATION;  Surgeon: Autumn Messing III, MD;  Location: [facility];  Service: General;  Laterality: N/A;  . TONSILLECTOMY    . VASCULAR SURGERY Right    revascularization right lower extremity   Social History   Social History Narrative   Lives with spouse, Indep ADLs   Supportive family nearby   No dietary restrictions   family history includes Cervical cancer in her mother; Cirrhosis in her sister; Kidney disease in her sister; Liver disease in her sister; Stroke in her mother.   Review of Systems Fatigue  Objective:   Physical Exam BP 104/70 (BP Location: Left Arm, Patient Position: Sitting, Cuff Size: Normal)   Pulse 100   Ht 5\' 4"  (1.626 m)   Wt 186 lb 2 oz (84.4 kg)   BMI 31.95 kg/m  NAD abdomen is soft and non-tender and there is a low midline scar and palpable soft and  reducible Left ventral hernia about size of a small baseball.   15 minutes time spent with patient > half in counseling coordination of care

## 2018-05-21 NOTE — Patient Instructions (Signed)
   We are giving you information to read on Ventral Hernia's.    Follow up with Dr Carlean Purl as needed.    I appreciate the opportunity to care for you. Silvano Rusk, MD, [facility]

## 2018-05-22 ENCOUNTER — Encounter: Payer: Self-pay | Admitting: Internal Medicine

## 2018-05-22 DIAGNOSIS — K439 Ventral hernia without obstruction or gangrene: Secondary | ICD-10-CM | POA: Insufficient documentation

## 2018-05-22 HISTORY — DX: Ventral hernia without obstruction or gangrene: K43.9

## 2018-05-28 MED FILL — MELOXICAM 15 MG TABLET: 15 | 30 days supply | Qty: 30 | Fill #0

## 2018-05-30 MED FILL — HEMOCYTE PLUS CAPSULE: 106-1 | 30 days supply | Qty: 60 | Fill #1

## 2018-05-30 MED FILL — POTASSIUM CHLORIDE CRYS ER: 20 | 90 days supply | Qty: 90 | Fill #1

## 2018-06-07 MED FILL — ALENDRONATE NA 70 MG TAB: 70 | 28 days supply | Qty: 4 | Fill #1

## 2018-06-18 ENCOUNTER — Other Ambulatory Visit: Payer: Self-pay | Admitting: Internal Medicine

## 2018-06-18 MED FILL — AMITRIPTYLINE HCL 100 MG TA: 100 | 90 days supply | Qty: 90 | Fill #1

## 2018-06-19 MED FILL — OMEPRAZOLE 20 MG CPDR: 20 | 90 days supply | Qty: 90 | Fill #0

## 2018-06-25 MED FILL — MELOXICAM 15 MG TABLET: 15 | 30 days supply | Qty: 30 | Fill #0

## 2018-07-01 ENCOUNTER — Ambulatory Visit: Payer: PPO | Admitting: *Deleted

## 2018-07-03 MED FILL — FLUTICASONE PROP 50 MCG SPR: 50 | 30 days supply | Qty: 16 | Fill #2

## 2018-07-08 ENCOUNTER — Other Ambulatory Visit: Payer: Self-pay | Admitting: Family Medicine

## 2018-07-08 MED FILL — VIT D2 1.25 MG (50,000 UNIT: 1.25 MG | 84 days supply | Qty: 12 | Fill #1

## 2018-07-08 MED FILL — ALPRAZolam 1 MG TABS: 1 | 90 days supply | Qty: 90 | Fill #0

## 2018-07-08 NOTE — Telephone Encounter (Signed)
Requesting:Xanax Contract:yes UDS:low risk next screen 02/13/18 Last OV:04/18/18 Next OV:02/06/19 Last Refill:12/01/17  #[id number] Database:   Please advise

## 2018-07-10 DIAGNOSIS — M170 Bilateral primary osteoarthritis of knee: Secondary | ICD-10-CM | POA: Diagnosis not present

## 2018-07-10 DIAGNOSIS — M1711 Unilateral primary osteoarthritis, right knee: Secondary | ICD-10-CM | POA: Diagnosis not present

## 2018-07-10 DIAGNOSIS — M1712 Unilateral primary osteoarthritis, left knee: Secondary | ICD-10-CM | POA: Diagnosis not present

## 2018-07-13 MED FILL — ZOLPIDEM TARTRATE 5 MG TAB: 5 | 90 days supply | Qty: 90 | Fill #1

## 2018-07-16 MED FILL — ACETAMINOPHEN/COD #3 TABLET: 300-30 | 15 days supply | Qty: 60 | Fill #0

## 2018-07-29 ENCOUNTER — Other Ambulatory Visit: Payer: Self-pay | Admitting: Family Medicine

## 2018-07-29 MED FILL — HEMOCYTE PLUS CAPSULE: 106-1 | 30 days supply | Qty: 60 | Fill #2

## 2018-07-29 MED FILL — METHOCARBAMOL 500 MG TABLET: 500 | 30 days supply | Qty: 90 | Fill #0

## 2018-07-29 MED FILL — MELOXICAM 15 MG TABLET: 15 | 30 days supply | Qty: 30 | Fill #0

## 2018-07-29 MED FILL — GABAPENTIN 300 MG CAPSULE: 300 | 90 days supply | Qty: 90 | Fill #0

## 2018-07-29 MED FILL — ALENDRONATE NA 70 MG TAB: 70 | 28 days supply | Qty: 4 | Fill #2

## 2018-07-30 MED FILL — MONTELUKAST SOD 10 MG TAB: 10 | 90 days supply | Qty: 90 | Fill #3

## 2018-08-06 MED FILL — LEVOCETIRIZINE 5 MG TABLET: 5 | 90 days supply | Qty: 90 | Fill #3

## 2018-08-20 MED FILL — DICLOFENAC SODIUM 1 % GEL: 1 | 25 days supply | Qty: 400 | Fill #0

## 2018-08-23 MED FILL — MELOXICAM 15 MG TABLET: 15 | 30 days supply | Qty: 30 | Fill #0

## 2018-08-29 ENCOUNTER — Other Ambulatory Visit: Payer: Self-pay | Admitting: Family Medicine

## 2018-08-30 MED FILL — POTASSIUM CHLORIDE CRYS ER: 20 | 90 days supply | Qty: 90 | Fill #0

## 2018-09-10 ENCOUNTER — Ambulatory Visit: Payer: PPO | Admitting: *Deleted

## 2018-09-11 ENCOUNTER — Other Ambulatory Visit: Payer: Self-pay | Admitting: Family Medicine

## 2018-09-11 MED FILL — CYANOCOBALAMIN 1,000 MCG/ML: 1000 | 180 days supply | Qty: 6 | Fill #1

## 2018-09-12 MED FILL — VALACYCLOVIR HCL 500 MG TAB: 500 | 30 days supply | Qty: 30 | Fill #0

## 2018-09-12 NOTE — Progress Notes (Addendum)
Virtual Visit via Video Note  I connected with patient on 09/13/18 at  1:00 PM EDT by audio enabled telemedicine application and verified that I am speaking with the correct person using two identifiers.   THIS ENCOUNTER IS A VIRTUAL VISIT DUE TO COVID-19 - PATIENT WAS NOT SEEN IN THE OFFICE. PATIENT HAS CONSENTED TO VIRTUAL VISIT / TELEMEDICINE VISIT   Location of patient: home  Location of provider: office  I discussed the limitations of evaluation and management by telemedicine and the availability of in person appointments. The patient expressed understanding and agreed to proceed.   Subjective:   Alexis Olson is a 85 y.o. female who presents for Medicare Annual (Subsequent) preventive examination.  Review of Systems: No ROS.  Medicare Wellness Virtual Visit.  Visual/audio telehealth visit, UTA vital signs.   See social history for additional risk factors. Cardiac Risk Factors include: advanced age (>25men, >18 women);dyslipidemia Sleep patterns: Takes xanax or [place] as needed. Sleeps well with meds. Home Safety/Smoke Alarms: Feels safe in home. Smoke alarms in place. Lives with husband in 1 story home. 3 cats. Pt uses cane when walking. Walk-in shower.  Eye:  Dr. Prudencio Burly yearly.   Female:       Mammo- declines       Dexa scan- 02/25/18        Objective:     Advanced Directives 09/13/2018 09/18/2014 09/18/2014 02/12/2014  Does Patient Have a Medical Advance Directive? Yes No Yes Yes  Type of Paramedic of [place];Living will - [place]  Does patient want to make changes to medical advance directive? No - Patient declined - - No - Patient declined  Copy of [place] in Chart? No - copy requested - - No - copy requested    Tobacco Social History   Tobacco Use  Smoking Status Never Smoker  Smokeless Tobacco Never Used     Counseling given: Not Answered   Clinical  Intake: Pain : No/denies pain    Past Medical History:  Diagnosis Date  . ALLERGIC RHINITIS   . ANEMIA, CHRONIC    Malabsorption related to bypass hx, B12 and iron deficiency  . ANXIETY   . ASTHMA   . Bariatric surgery status   . C. difficile diarrhea   . Colon perforation ([place]) 08/2014   following polypetomy during colo, surgical repair  . COLONIC POLYPS, ADENOMATOUS, HX OF 2010  . DIVERTICULOSIS, COLON   . GERD (gastroesophageal reflux disease) 08/23/2015  . HSV (herpes simplex virus) infection 08/16/2015  . Hypertension   . Hyponatremia 05/02/2016  . INSOMNIA   . Lumbosacral disc disease    Chronic pain; History of osteomyelitis 2010 following ESI complication  . Obesity   . Pelvic fracture ([place])   . Recurrent UTI 08/23/2015  . URINARY URGENCY   . Ventral hernia without obstruction or gangrene 05/22/2018  . VITAMIN B12 DEFICIENCY    Past Surgical History:  Procedure Laterality Date  . [place]   for ruptured disc  . CHOLECYSTECTOMY    . greenfield filter    . ileojejunal bypass  1976   for obesity  . LAPAROTOMY N/A 09/18/2014   Procedure: EXPLORATORY LAPAROTOMY WITH REPAIR OF CECIAL PERFORATION;  Surgeon: Autumn Messing III, MD;  Location: [facility];  Service: General;  Laterality: N/A;  . TONSILLECTOMY    . VASCULAR SURGERY Right    revascularization right lower extremity   Family History  Problem  Relation Age of Onset  . Cervical cancer Mother   . Stroke Mother   . Liver disease Sister   . Kidney disease Sister   . Cirrhosis Sister   . Colon cancer Neg Hx   . Esophageal cancer Neg Hx   . Rectal cancer Neg Hx   . Stomach cancer Neg Hx    Social History   Socioeconomic History  . Marital status: Married    Spouse name: Not on file  . Number of children: 3  . Years of education: Not on file  . Highest education level: Not on file  Occupational History  . Occupation: retired Programmer, multimedia: RETIRED  Social Needs  . Financial resource strain: Not on file   . Food insecurity    Worry: Not on file    Inability: Not on file  . Transportation needs    Medical: Not on file    Non-medical: Not on file  Tobacco Use  . Smoking status: Never Smoker  . Smokeless tobacco: Never Used  Substance and Sexual Activity  . Alcohol use: No    Alcohol/week: 0.0 standard drinks  . Drug use: No  . Sexual activity: Never    Partners: Male  Lifestyle  . Physical activity    Days per week: Not on file    Minutes per session: Not on file  . Stress: Not on file  Relationships  . Social Herbalist on phone: Not on file    Gets together: Not on file    Attends religious service: Not on file    Active member of club or organization: Not on file    Attends meetings of clubs or organizations: Not on file    Relationship status: Not on file  Other Topics Concern  . Not on file  Social History Narrative   Lives with spouse, Indep ADLs   Supportive family nearby   No dietary restrictions    Outpatient Encounter Medications as of 09/13/2018  Medication Sig  . acetaminophen-codeine (TYLENOL #3) 300-30 MG tablet Take 1 tablet by mouth every 4 (four) hours as needed for moderate pain or severe pain.  Marland Kitchen albuterol (PROVENTIL HFA;VENTOLIN HFA) 108 (90 BASE) MCG/ACT inhaler Inhale 1-2 puffs into the lungs every 6 (six) hours as needed for wheezing or shortness of breath.  Marland Kitchen alendronate (FOSAMAX) 70 MG tablet Take 1 tablet (70 mg total) by mouth every 7 (seven) days. Take with a full glass of water on an empty stomach.  . ALPRAZolam (XANAX) 1 MG tablet TAKE 1/2 TO 1 TABLET BY MOUTH AT BEDTIME AS NEEDED  . amitriptyline (ELAVIL) 100 MG tablet TAKE 1 TABLET (100 MG TOTAL) BY MOUTH DAILY.  Marland Kitchen azelastine (OPTIVAR) 0.05 % ophthalmic solution Place 1 drop into both eyes daily as needed (for dry eyes).   . benzonatate (TESSALON) 100 MG capsule Take 1 capsule (100 mg total) by mouth 3 (three) times daily as needed for cough.  . budesonide-formoterol (SYMBICORT)  160-4.5 MCG/ACT inhaler Inhale 2 puffs into the lungs See admin instructions. Inhale 2 puffs into the lungs one to two times a day  . cetirizine (ZYRTEC) 10 MG tablet Take 10 mg by mouth daily.   . cholecalciferol (VITAMIN D) 1000 UNITS tablet Take 1,000 Units by mouth daily.  . cyanocobalamin (,VITAMIN B-12,) 1000 MCG/ML injection INJECT 1 ML INTO THE MUSCLE EVERY 30 DAYS.  Marland Kitchen diclofenac sodium (VOLTAREN) 1 % GEL Apply 2-4 g topically See admin instructions. Apply 2-4  grams to both knees once a day  . dicyclomine (BENTYL) 10 MG capsule Take 1 capsule (10 mg total) by mouth 3 (three) times daily. (Patient taking differently: Take 10 mg by mouth daily as needed (for diarrhea & symptoms). )  . Diphenhyd-Hydrocort-Nystatin (FIRST-DUKES MOUTHWASH) SUSP Take 1 to 2 teaspoonfuls every 4 to 6 hours as directed.  . diphenoxylate-atropine (LOMOTIL) 2.5-0.025 MG tablet Take 2 tablets by mouth 3 (three) times daily as needed for diarrhea or loose stools.  . Fe Fum-FA-B Cmp-C-Zn-Mg-Mn-Cu (HEMOCYTE PLUS) 106-1 MG CAPS Take 1 capsule by mouth 2 (two) times daily.  . fluticasone (FLONASE) 50 MCG/ACT nasal spray 2 sprays by Nasal route daily.    Marland Kitchen gabapentin (NEURONTIN) 300 MG capsule TAKE 1 CAPSULE BY MOUTH AT BEDTIME  . hydrOXYzine (ATARAX) 25 MG tablet Take 25 mg by mouth 3 (three) times daily as needed for itching.   . levocetirizine (XYZAL) 5 MG tablet Take 1 tablet (5 mg total) by mouth every evening.  . magic mouthwash SOLN Take 5 mLs by mouth 4 (four) times daily as needed for mouth pain. Swish and Spit  . magnesium oxide (MAG-OX 400) 400 MG tablet Take 400 mg by mouth 2 (two) times daily.   . meloxicam (MOBIC) 15 MG tablet Take 15 mg by mouth daily.  . montelukast (SINGULAIR) 10 MG tablet Take 10 mg by mouth at bedtime.   Marland Kitchen omeprazole (PRILOSEC) 20 MG capsule TAKE 1 CAPSULE BY MOUTH DAILY.  Marland Kitchen ondansetron (ZOFRAN-ODT) 4 MG disintegrating tablet Take 1 tablet (4 mg total) by mouth 3 (three) times daily as  needed for nausea or vomiting.  Marland Kitchen oxyCODONE-acetaminophen (PERCOCET/ROXICET) 5-325 MG tablet   . phenazopyridine (PYRIDIUM) 200 MG tablet phenazopyridine 200 mg tablet  TAKE 1 TABLET BY MOUTH THREE TIMES A DAY AS NEEDED FOR PAIN  . polyethylene glycol powder (MIRALAX) powder Take 17 g by mouth daily. (Patient taking differently: Take 17 g by mouth daily as needed for mild constipation. )  . potassium chloride SA (K-DUR) 20 MEQ tablet TAKE 1 TABLET BY MOUTH ONCE DAILY  . PX ENTERIC ASPIRIN 81 MG EC tablet Take 81 mg by mouth 2 (two) times a week.   . valACYclovir (VALTREX) 500 MG tablet TAKE 1 TABLET (500 MG TOTAL) BY MOUTH AS NEEDED.  . Vitamin D, Ergocalciferol, (DRISDOL) 1.25 MG (50000 UT) CAPS capsule Take 1 capsule (50,000 Units total) by mouth every 7 (seven) days.  . vitamin E 400 UNIT capsule Take 400 Units by mouth daily.    Marland Kitchen zolpidem (AMBIEN) 5 MG tablet Take 1 tablet (5 mg total) by mouth at bedtime.  . [DISCONTINUED] valACYclovir (VALTREX) 500 MG tablet Take 1 tablet (500 mg total) by mouth as needed. (Patient taking differently: Take 500 mg by mouth daily as needed (for flares of fever blisters). )   No facility-administered encounter medications on file as of 09/13/2018.     Activities of Daily Living In your present state of health, do you have any difficulty performing the following activities: 09/13/2018  Hearing? N  Vision? N  Difficulty concentrating or making decisions? N  Walking or climbing stairs? Y  Dressing or bathing? N  Doing errands, shopping? N  Preparing Food and eating ? N  Using the Toilet? N  In the past six months, have you accidently leaked urine? N  Do you have problems with loss of bowel control? N  Managing your Medications? N  Managing your Finances? N  Housekeeping or managing your Housekeeping? N  Some recent data might be hidden    Patient Care Team: Mosie Lukes, MD as PCP - General (Family Medicine) Rana Snare, MD (Urology)  Paula Compton, MD (Obstetrics and Gynecology) Drema Dallas, MD (Physical Medicine and Rehabilitation) Mosetta Anis, MD (Allergy) Suella Broad, MD (Physical Medicine and Rehabilitation) Katy Apo, MD (Ophthalmology)    Assessment:   This is a routine wellness examination for [place]. Physical assessment deferred to PCP.  Exercise Activities and Dietary recommendations Current Exercise Habits: The patient does not participate in regular exercise at present, Exercise limited by: orthopedic condition(s) Diet (meal preparation, eat out, water intake, caffeinated beverages, dairy products, fruits and vegetables): well balanced, on average, 3 meals per day     Goals    . Increase physical activity       Fall Risk Fall Risk  09/13/2018 02/13/2017 02/22/2015 02/12/2014 02/12/2014  Falls in the past year? 0 Yes Yes Yes No  Number falls in past yr: - 2 or more 1 1 -  Injury with Fall? - No Yes Yes -  Risk for fall due to : - Impaired balance/gait - - -  Follow up - Falls evaluation completed Falls prevention discussed - -    Depression Screen PHQ 2/9 Scores 09/13/2018 02/13/2017 02/22/2015 02/12/2014  PHQ - 2 Score 0 0 1 1     Cognitive Function Ad8 score reviewed for issues:  Issues making decisions:no  Less interest in hobbies / activities:no  Repeats questions, stories (family complaining):no  Trouble using ordinary gadgets (microwave, computer, phone):no  Forgets the month or year: no  Mismanaging finances: no  Remembering appts:no  Daily problems with thinking and/or memory:no Ad8 score is=0         Immunization History  Administered Date(s) Administered  . Influenza Whole 12/24/2008  . Influenza, High Dose Seasonal PF 12/15/2013, 12/21/2015  . Influenza,inj,Quad PF,6+ Mos 12/27/2012  . Influenza-Unspecified 11/26/2011, 12/21/2014  . Pneumococcal Conjugate-13 02/12/2014  . Pneumococcal Polysaccharide-23 03/27/2005  . Td 02/12/2014  .  Zoster 02/06/2012     Screening Tests Health Maintenance  Topic Date Due  . INFLUENZA VACCINE  10/26/2018  . TETANUS/TDAP  02/13/2024  . DEXA SCAN  Completed  . PNA vac Low Risk Adult  Completed       Plan:   See you next year!  Continue to eat heart healthy diet (full of fruits, vegetables, whole grains, lean protein, water--limit salt, fat, and sugar intake) and increase physical activity as tolerated.  Continue doing brain stimulating activities (puzzles, reading, adult coloring books, staying active) to keep memory sharp.   Bring a copy of your living will and/or healthcare power of attorney to your next office visit.   I have personally reviewed and noted the following in the patient's chart:   . Medical and social history . Use of alcohol, tobacco or illicit drugs  . Current medications and supplements . Functional ability and status . Nutritional status . Physical activity . Advanced directives . List of other physicians . Hospitalizations, surgeries, and ER visits in previous 12 months . Vitals . Screenings to include cognitive, depression, and falls . Referrals and appointments  In addition, I have reviewed and discussed with patient certain preventive protocols, quality metrics, and best practice recommendations. A written personalized care plan for preventive services as well as general preventive health recommendations were provided to patient.     Shela Nevin, [place]  09/13/2018  Medical screening examination/treatment was performed by qualified clinical staff member and as supervising  physician I was immediately available for consultation/collaboration. I have reviewed documentation and agree with assessment and plan.  Penni Homans, MD

## 2018-09-13 ENCOUNTER — Other Ambulatory Visit: Payer: Self-pay

## 2018-09-13 ENCOUNTER — Ambulatory Visit (INDEPENDENT_AMBULATORY_CARE_PROVIDER_SITE_OTHER): Payer: PPO | Admitting: *Deleted

## 2018-09-13 ENCOUNTER — Encounter: Payer: Self-pay | Admitting: *Deleted

## 2018-09-13 DIAGNOSIS — Z0000 Encounter for general adult medical examination without abnormal findings: Secondary | ICD-10-CM

## 2018-09-13 NOTE — Patient Instructions (Addendum)
See you next year!  Continue to eat heart healthy diet (full of fruits, vegetables, whole grains, lean protein, water--limit salt, fat, and sugar intake) and increase physical activity as tolerated.  Continue doing brain stimulating activities (puzzles, reading, adult coloring books, staying active) to keep memory sharp.   Bring a copy of your living will and/or healthcare power of attorney to your next office visit.   Ms. Alexis Olson , Thank you for taking time to come for your Medicare Wellness Visit. I appreciate your ongoing commitment to your health goals. Please review the following plan we discussed and let me know if I can assist you in the future.   These are the goals we discussed: Goals    . Increase physical activity       This is a list of the screening recommended for you and due dates:  Health Maintenance  Topic Date Due  . Flu Shot  10/26/2018  . Tetanus Vaccine  02/13/2024  . DEXA scan (bone density measurement)  Completed  . Pneumonia vaccines  Completed

## 2018-09-14 MED FILL — OMEPRAZOLE 20 MG CPDR: 20 | 90 days supply | Qty: 90 | Fill #1

## 2018-09-14 MED FILL — ALENDRONATE NA 70 MG TAB: 70 | 28 days supply | Qty: 4 | Fill #3

## 2018-09-17 ENCOUNTER — Telehealth: Payer: Self-pay | Admitting: Family Medicine

## 2018-09-17 NOTE — Telephone Encounter (Signed)
Patient called stating she thought someone would call her about scheduling labs.  Patient has no lab orders.  She said Glenard Haring) did her AWV and that angel was suppose to get her scheduled. Patient back # [phone]

## 2018-09-20 ENCOUNTER — Other Ambulatory Visit: Payer: Self-pay | Admitting: Family Medicine

## 2018-09-20 MED FILL — AMITRIPTYLINE HCL 100 MG TA: 100 | 90 days supply | Qty: 90 | Fill #0

## 2018-09-24 ENCOUNTER — Ambulatory Visit (INDEPENDENT_AMBULATORY_CARE_PROVIDER_SITE_OTHER): Payer: PPO | Admitting: Family Medicine

## 2018-09-24 ENCOUNTER — Other Ambulatory Visit: Payer: Self-pay

## 2018-09-24 VITALS — Wt 185.0 lb

## 2018-09-24 DIAGNOSIS — E538 Deficiency of other specified B group vitamins: Secondary | ICD-10-CM

## 2018-09-24 DIAGNOSIS — F411 Generalized anxiety disorder: Secondary | ICD-10-CM

## 2018-09-24 DIAGNOSIS — G4700 Insomnia, unspecified: Secondary | ICD-10-CM | POA: Diagnosis not present

## 2018-09-24 DIAGNOSIS — K219 Gastro-esophageal reflux disease without esophagitis: Secondary | ICD-10-CM

## 2018-09-24 DIAGNOSIS — E785 Hyperlipidemia, unspecified: Secondary | ICD-10-CM | POA: Diagnosis not present

## 2018-09-24 MED ORDER — ALENDRONATE SODIUM 70 MG PO TABS: 70.0000 mg | ORAL_TABLET | ORAL | 5 refills | Status: DC

## 2018-09-24 MED ORDER — VITAMIN D (ERGOCALCIFEROL) 1.25 MG (50000 UNIT) PO CAPS: 50000.0000 [IU] | ORAL_CAPSULE | ORAL | 4 refills | Status: DC

## 2018-09-24 MED FILL — MELOXICAM 15 MG TABLET: 15 | 30 days supply | Qty: 30 | Fill #0

## 2018-09-24 MED FILL — VIT D2 1.25 MG (50,000 UNIT: 1.25 MG | 84 days supply | Qty: 12 | Fill #0

## 2018-09-24 NOTE — Assessment & Plan Note (Signed)
Encouraged good sleep hygiene such as dark, quiet room. No blue/green glowing lights such as computer screens in bedroom. No alcohol or stimulants in evening. Cut down on caffeine as able. Regular exercise is helpful but not just prior to bed time.  

## 2018-09-24 NOTE — Assessment & Plan Note (Signed)
Notes increased stress during the pandemic but she is managing well most days

## 2018-09-24 NOTE — Telephone Encounter (Signed)
Spoke with patient made appt with Alexis Olson today and then labs

## 2018-09-24 NOTE — Progress Notes (Signed)
Virtual Visit via phone Note  I connected with Alexis Olson on 09/24/18 at  2:20 PM EDT by a phone enabled telemedicine application and verified that I am speaking with the correct person using two identifiers.  Location: Patient: home Provider: home   I discussed the limitations of evaluation and management by telemedicine and the availability of in person appointments. The patient expressed understanding and agreed to proceed. Magdalene Molly, CMA attempted to get the patient set up on a video visit and was unable so the visit was completed via phone    Subjective:    Patient ID: Alexis Olson, female    DOB: 01-25-1934, 85 y.o.   MRN: [id number]  No chief complaint on file.   HPI Patient is in today for follow up on chronic medical concerns including anxiety, hypertension, hyperlipidemia and more. She is maintaining quarantine well most days with family support. No recent febrile illness or hospitalizations. No polyuria or polydipsia. Denies CP/palp/SOB/HA/congestion/fevers/GI or GU c/o. Taking meds as prescribed  Past Medical History:  Diagnosis Date  . ALLERGIC RHINITIS   . ANEMIA, CHRONIC    Malabsorption related to bypass hx, B12 and iron deficiency  . ANXIETY   . ASTHMA   . Bariatric surgery status   . C. difficile diarrhea   . Colon perforation ([place]) 08/2014   following polypetomy during colo, surgical repair  . COLONIC POLYPS, ADENOMATOUS, HX OF 2010  . DIVERTICULOSIS, COLON   . GERD (gastroesophageal reflux disease) 08/23/2015  . HSV (herpes simplex virus) infection 08/16/2015  . Hypertension   . Hyponatremia 05/02/2016  . INSOMNIA   . Lumbosacral disc disease    Chronic pain; History of osteomyelitis 2010 following ESI complication  . Obesity   . Pelvic fracture ([place])   . Recurrent UTI 08/23/2015  . URINARY URGENCY   . Ventral hernia without obstruction or gangrene 05/22/2018  . VITAMIN B12 DEFICIENCY     Past Surgical History:  Procedure Laterality Date  .  [place]   for ruptured disc  . CHOLECYSTECTOMY    . greenfield filter    . ileojejunal bypass  1976   for obesity  . LAPAROTOMY N/A 09/18/2014   Procedure: EXPLORATORY LAPAROTOMY WITH REPAIR OF CECIAL PERFORATION;  Surgeon: Autumn Messing III, MD;  Location: [facility];  Service: General;  Laterality: N/A;  . TONSILLECTOMY    . VASCULAR SURGERY Right    revascularization right lower extremity    Family History  Problem Relation Age of Onset  . Cervical cancer Mother   . Stroke Mother   . Liver disease Sister   . Kidney disease Sister   . Cirrhosis Sister   . Colon cancer Neg Hx   . Esophageal cancer Neg Hx   . Rectal cancer Neg Hx   . Stomach cancer Neg Hx     Social History   Socioeconomic History  . Marital status: Married    Spouse name: Not on file  . Number of children: 3  . Years of education: Not on file  . Highest education level: Not on file  Occupational History  . Occupation: retired Programmer, multimedia: RETIRED  Social Needs  . Financial resource strain: Not on file  . Food insecurity    Worry: Not on file    Inability: Not on file  . Transportation needs    Medical: Not on file    Non-medical: Not on file  Tobacco Use  . Smoking status: Never Smoker  .  Smokeless tobacco: Never Used  Substance and Sexual Activity  . Alcohol use: No    Alcohol/week: 0.0 standard drinks  . Drug use: No  . Sexual activity: Never    Partners: Male  Lifestyle  . Physical activity    Days per week: Not on file    Minutes per session: Not on file  . Stress: Not on file  Relationships  . Social Herbalist on phone: Not on file    Gets together: Not on file    Attends religious service: Not on file    Active member of club or organization: Not on file    Attends meetings of clubs or organizations: Not on file    Relationship status: Not on file  . Intimate partner violence    Fear of current or ex partner: Not on file    Emotionally abused: Not on file     Physically abused: Not on file    Forced sexual activity: Not on file  Other Topics Concern  . Not on file  Social History Narrative   Lives with spouse, Indep ADLs   Supportive family nearby   No dietary restrictions    Outpatient Medications Prior to Visit  Medication Sig Dispense Refill  . acetaminophen-codeine (TYLENOL #3) 300-30 MG tablet Take 1 tablet by mouth every 4 (four) hours as needed for moderate pain or severe pain. 15 tablet 0  . albuterol (PROVENTIL HFA;VENTOLIN HFA) 108 (90 BASE) MCG/ACT inhaler Inhale 1-2 puffs into the lungs every 6 (six) hours as needed for wheezing or shortness of breath.    . ALPRAZolam (XANAX) 1 MG tablet TAKE 1/2 TO 1 TABLET BY MOUTH AT BEDTIME AS NEEDED 90 tablet 1  . amitriptyline (ELAVIL) 100 MG tablet TAKE 1 TABLET BY MOUTH DAILY. 90 tablet 1  . azelastine (OPTIVAR) 0.05 % ophthalmic solution Place 1 drop into both eyes daily as needed (for dry eyes).     . benzonatate (TESSALON) 100 MG capsule Take 1 capsule (100 mg total) by mouth 3 (three) times daily as needed for cough. 30 capsule 0  . budesonide-formoterol (SYMBICORT) 160-4.5 MCG/ACT inhaler Inhale 2 puffs into the lungs See admin instructions. Inhale 2 puffs into the lungs one to two times a day 1 Inhaler 2  . cetirizine (ZYRTEC) 10 MG tablet Take 10 mg by mouth daily.     . cholecalciferol (VITAMIN D) 1000 UNITS tablet Take 1,000 Units by mouth daily.    . cyanocobalamin (,VITAMIN B-12,) 1000 MCG/ML injection INJECT 1 ML INTO THE MUSCLE EVERY 30 DAYS. 6 mL 1  . diclofenac sodium (VOLTAREN) 1 % GEL Apply 2-4 g topically See admin instructions. Apply 2-4 grams to both knees once a day  0  . dicyclomine (BENTYL) 10 MG capsule Take 1 capsule (10 mg total) by mouth 3 (three) times daily. (Patient taking differently: Take 10 mg by mouth daily as needed (for diarrhea & symptoms). ) 270 capsule 3  . Diphenhyd-Hydrocort-Nystatin (FIRST-DUKES MOUTHWASH) SUSP Take 1 to 2 teaspoonfuls every 4 to 6  hours as directed. 120 mL 2  . diphenoxylate-atropine (LOMOTIL) 2.5-0.025 MG tablet Take 2 tablets by mouth 3 (three) times daily as needed for diarrhea or loose stools. 150 tablet 0  . Fe Fum-FA-B Cmp-C-Zn-Mg-Mn-Cu (HEMOCYTE PLUS) 106-1 MG CAPS Take 1 capsule by mouth 2 (two) times daily. 60 each 5  . fluticasone (FLONASE) 50 MCG/ACT nasal spray 2 sprays by Nasal route daily.      Marland Kitchen gabapentin (NEURONTIN)  300 MG capsule TAKE 1 CAPSULE BY MOUTH AT BEDTIME 90 capsule 0  . hydrOXYzine (ATARAX) 25 MG tablet Take 25 mg by mouth 3 (three) times daily as needed for itching.     . levocetirizine (XYZAL) 5 MG tablet Take 1 tablet (5 mg total) by mouth every evening. 90 tablet 3  . magic mouthwash SOLN Take 5 mLs by mouth 4 (four) times daily as needed for mouth pain. Swish and Spit 240 mL 1  . magnesium oxide (MAG-OX 400) 400 MG tablet Take 400 mg by mouth 2 (two) times daily.     . meloxicam (MOBIC) 15 MG tablet Take 15 mg by mouth daily.  1  . montelukast (SINGULAIR) 10 MG tablet Take 10 mg by mouth at bedtime.     Marland Kitchen omeprazole (PRILOSEC) 20 MG capsule TAKE 1 CAPSULE BY MOUTH DAILY. 90 capsule 3  . ondansetron (ZOFRAN-ODT) 4 MG disintegrating tablet Take 1 tablet (4 mg total) by mouth 3 (three) times daily as needed for nausea or vomiting. 20 tablet 0  . oxyCODONE-acetaminophen (PERCOCET/ROXICET) 5-325 MG tablet   0  . phenazopyridine (PYRIDIUM) 200 MG tablet phenazopyridine 200 mg tablet  TAKE 1 TABLET BY MOUTH THREE TIMES A DAY AS NEEDED FOR PAIN    . polyethylene glycol powder (MIRALAX) powder Take 17 g by mouth daily. (Patient taking differently: Take 17 g by mouth daily as needed for mild constipation. ) 255 g 0  . potassium chloride SA (K-DUR) 20 MEQ tablet TAKE 1 TABLET BY MOUTH ONCE DAILY 90 tablet 1  . PX ENTERIC ASPIRIN 81 MG EC tablet Take 81 mg by mouth 2 (two) times a week.     . valACYclovir (VALTREX) 500 MG tablet TAKE 1 TABLET (500 MG TOTAL) BY MOUTH AS NEEDED. 30 tablet 5  . vitamin E  400 UNIT capsule Take 400 Units by mouth daily.      Marland Kitchen zolpidem (AMBIEN) 5 MG tablet Take 1 tablet (5 mg total) by mouth at bedtime. 90 tablet 1  . alendronate (FOSAMAX) 70 MG tablet Take 1 tablet (70 mg total) by mouth every 7 (seven) days. Take with a full glass of water on an empty stomach. 4 tablet 5  . Vitamin D, Ergocalciferol, (DRISDOL) 1.25 MG (50000 UT) CAPS capsule Take 1 capsule (50,000 Units total) by mouth every 7 (seven) days. 12 capsule 4   No facility-administered medications prior to visit.     Allergies  Allergen Reactions  . Ativan [Lorazepam] Anaphylaxis and Other (See Comments)    Deathly allergic; "resp rate dropped dropped to 6"  . Other Anaphylaxis    Red peppers--choking, also  . Azithromycin Diarrhea  . Benadryl [Diphenhydramine Hcl] Other (See Comments)    Paradoxical response  . Ciprofloxacin Hcl Other (See Comments)    Causes yeast infection and refuses to take  . Codeine Phosphate Nausea Only    Can tolerate in limited amounts  . Hydrocodone Itching and Nausea And Vomiting  . Hydrocodone-Homatropine Itching and Nausea And Vomiting  . Levaquin [Levofloxacin In D5w] Other (See Comments)    Muscle soreness  . Levofloxacin   . Meperidine Hcl Other (See Comments)    Reaction ??  . Molds & Smuts Other (See Comments)    unsure  . Sulfa Antibiotics Other (See Comments)    Joint pain  . Tape Other (See Comments)    No Coban wrap (per the patient)  . Ultram [Tramadol Hcl] Other (See Comments)    "Can't move joints"  Review of Systems  Constitutional: Positive for malaise/fatigue. Negative for fever.  HENT: Negative for congestion.   Eyes: Negative for blurred vision.  Respiratory: Negative for shortness of breath.   Cardiovascular: Negative for chest pain, palpitations and leg swelling.  Gastrointestinal: Negative for abdominal pain, blood in stool and nausea.  Genitourinary: Negative for dysuria and frequency.  Musculoskeletal: Negative for falls.   Skin: Negative for rash.  Neurological: Negative for dizziness, loss of consciousness and headaches.  Endo/Heme/Allergies: Negative for environmental allergies.  Psychiatric/Behavioral: Negative for depression. The patient is nervous/anxious.        Objective:    Physical Exam Unable to obtain via telephone  Wt 185 lb (83.9 kg)   BMI 31.76 kg/m  Wt Readings from Last 3 Encounters:  09/24/18 185 lb (83.9 kg)  05/21/18 186 lb 2 oz (84.4 kg)  04/18/18 187 lb 12.8 oz (85.2 kg)    Diabetic Foot Exam - Simple   No data filed     Lab Results  Component Value Date   WBC 7.4 04/18/2018   HGB 11.7 (L) 04/18/2018   HCT 35.3 (L) 04/18/2018   PLT 302.0 04/18/2018   GLUCOSE 82 04/18/2018   CHOL 161 04/01/2018   TRIG 193.0 (H) 04/01/2018   HDL 49.80 04/01/2018   LDLDIRECT 66.0 08/17/2015   LDLCALC 73 04/01/2018   ALT 13 04/18/2018   AST 17 04/18/2018   NA 132 (L) 04/18/2018   K 3.9 04/18/2018   CL 96 04/18/2018   CREATININE 0.55 04/18/2018   BUN 11 04/18/2018   CO2 30 04/18/2018   TSH 1.21 04/01/2018   INR 1.01 05/13/2009   HGBA1C 5.3 08/03/2006    Lab Results  Component Value Date   TSH 1.21 04/01/2018   Lab Results  Component Value Date   WBC 7.4 04/18/2018   HGB 11.7 (L) 04/18/2018   HCT 35.3 (L) 04/18/2018   MCV 94.7 04/18/2018   PLT 302.0 04/18/2018   Lab Results  Component Value Date   NA 132 (L) 04/18/2018   K 3.9 04/18/2018   CO2 30 04/18/2018   GLUCOSE 82 04/18/2018   BUN 11 04/18/2018   CREATININE 0.55 04/18/2018   BILITOT 0.3 04/18/2018   ALKPHOS 44 04/18/2018   AST 17 04/18/2018   ALT 13 04/18/2018   PROT 6.2 04/18/2018   ALBUMIN 3.9 04/18/2018   CALCIUM 8.9 04/18/2018   ANIONGAP 9 09/26/2014   GFR 105.57 04/18/2018   Lab Results  Component Value Date   CHOL 161 04/01/2018   Lab Results  Component Value Date   HDL 49.80 04/01/2018   Lab Results  Component Value Date   LDLCALC 73 04/01/2018   Lab Results  Component Value Date    TRIG 193.0 (H) 04/01/2018   Lab Results  Component Value Date   CHOLHDL 3 04/01/2018   Lab Results  Component Value Date   HGBA1C 5.3 08/03/2006       Assessment & Plan:   Problem List Items Addressed This Visit    Vitamin B 12 deficiency - Primary   Relevant Orders   Vitamin B12   Anxiety state    Notes increased stress during the pandemic but she is managing well most days      INSOMNIA    Encouraged good sleep hygiene such as dark, quiet room. No blue/green glowing lights such as computer screens in bedroom. No alcohol or stimulants in evening. Cut down on caffeine as able. Regular exercise is helpful but not just prior  to bed time.       Hyperlipidemia, mild    Encouraged heart healthy diet, increase exercise, avoid trans fats, consider a krill oil cap daily      Relevant Orders   Lipid panel   GERD (gastroesophageal reflux disease)   Relevant Orders   CBC   Comprehensive metabolic panel   TSH      I am having Renaldo Fiddler. Wilz maintain her vitamin E, fluticasone, hydrOXYzine, magnesium oxide, azelastine, montelukast, PX Enteric Aspirin, cetirizine, levocetirizine, cholecalciferol, albuterol, polyethylene glycol powder, [facility] Mouthwash, dicyclomine, diclofenac sodium, meloxicam, acetaminophen-codeine, ondansetron, oxyCODONE-acetaminophen, budesonide-formoterol, magic mouthwash, phenazopyridine, benzonatate, cyanocobalamin, Hemocyte Plus, diphenoxylate-atropine, zolpidem, omeprazole, ALPRAZolam, gabapentin, potassium chloride SA, valACYclovir, amitriptyline, alendronate, and Vitamin D (Ergocalciferol).  Meds ordered this encounter  Medications  . alendronate (FOSAMAX) 70 MG tablet    Sig: Take 1 tablet (70 mg total) by mouth every 7 (seven) days. Take with a full glass of water on an empty stomach.    Dispense:  4 tablet    Refill:  5  . Vitamin D, Ergocalciferol, (DRISDOL) 1.25 MG (50000 UT) CAPS capsule    Sig: Take 1 capsule (50,000 Units total) by mouth  every 7 (seven) days.    Dispense:  12 capsule    Refill:  4    I discussed the assessment and treatment plan with the patient. The patient was provided an opportunity to ask questions and all were answered. The patient agreed with the plan and demonstrated an understanding of the instructions.   The patient was advised to call back or seek an in-person evaluation if the symptoms worsen or if the condition fails to improve as anticipated.  I provided 25 minutes of non-face-to-face time during this encounter.   Penni Homans, MD

## 2018-09-24 NOTE — Assessment & Plan Note (Signed)
Encouraged heart healthy diet, increase exercise, avoid trans fats, consider a krill oil cap daily 

## 2018-09-25 ENCOUNTER — Telehealth: Payer: Self-pay | Admitting: Family Medicine

## 2018-09-26 ENCOUNTER — Telehealth: Payer: Self-pay | Admitting: Family Medicine

## 2018-09-26 ENCOUNTER — Other Ambulatory Visit: Payer: Self-pay | Admitting: Family Medicine

## 2018-09-26 MED ORDER — CEPHALEXIN 500 MG PO CAPS: 500.0000 mg | ORAL_CAPSULE | Freq: Two times a day (BID) | ORAL | 0 refills | Status: DC

## 2018-09-26 MED FILL — HEMOCYTE PLUS CAPSULE: 106-1 | 30 days supply | Qty: 60 | Fill #3

## 2018-09-26 MED FILL — CEPHALEXIN 500 MG CAPSULE: 500 | 7 days supply | Qty: 14 | Fill #0

## 2018-09-26 NOTE — Telephone Encounter (Signed)
Pt states that she has cloudy urine with terrible odor. She has discomfort but not burning with urination. 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 She said last time she was prescribed Cephalexin 500 mg Oral 2 times daily.    [employer] [place] [phone] (Phone) [phone] (Fax)    Pt was advised she would likely need virtual/office visit. [facility] office is full. Offered to transfer to [facility] and pt declined.

## 2018-09-26 NOTE — Telephone Encounter (Signed)
I have sent in the keflex please let her know if she does not get better we will need a urine sample next week

## 2018-09-26 NOTE — Telephone Encounter (Signed)
Please advise 

## 2018-09-26 NOTE — Telephone Encounter (Signed)
Patient made aware.

## 2018-10-04 ENCOUNTER — Other Ambulatory Visit: Payer: Self-pay

## 2018-10-04 ENCOUNTER — Other Ambulatory Visit (INDEPENDENT_AMBULATORY_CARE_PROVIDER_SITE_OTHER): Payer: PPO

## 2018-10-04 DIAGNOSIS — K219 Gastro-esophageal reflux disease without esophagitis: Secondary | ICD-10-CM | POA: Diagnosis not present

## 2018-10-04 DIAGNOSIS — E538 Deficiency of other specified B group vitamins: Secondary | ICD-10-CM

## 2018-10-04 DIAGNOSIS — E785 Hyperlipidemia, unspecified: Secondary | ICD-10-CM | POA: Diagnosis not present

## 2018-10-04 LAB — LIPID PANEL
Cholesterol: 150 mg/dL (ref 0–200)
HDL: 47.1 mg/dL (ref >39.00)
NonHDL: 102.73
Total CHOL/HDL Ratio: 3
Triglycerides: 205 mg/dL — ABNORMAL HIGH (ref 0.0–149.0)
VLDL: 41 mg/dL — ABNORMAL HIGH (ref 0.0–40.0)

## 2018-10-04 LAB — LDL CHOLESTEROL, DIRECT: Direct LDL: 76 mg/dL

## 2018-10-04 LAB — COMPREHENSIVE METABOLIC PANEL
ALT: 10 U/L (ref 0–35)
AST: 13 U/L (ref 0–37)
Albumin: 4.2 g/dL (ref 3.5–5.2)
Alkaline Phosphatase: 42 U/L (ref 39–117)
BUN: 15 mg/dL (ref 6–23)
CO2: 27 mEq/L (ref 19–32)
Calcium: 8.7 mg/dL (ref 8.4–10.5)
Chloride: 95 mEq/L — ABNORMAL LOW (ref 96–112)
Creatinine, Ser: 0.56 mg/dL (ref 0.40–1.20)
GFR: 103.28 mL/min (ref >60.00)
Glucose, Bld: 62 mg/dL — ABNORMAL LOW (ref 70–99)
Potassium: 4 mEq/L (ref 3.5–5.1)
Sodium: 131 mEq/L — ABNORMAL LOW (ref 135–145)
Total Bilirubin: 0.3 mg/dL (ref 0.2–1.2)
Total Protein: 6.4 g/dL (ref 6.0–8.3)

## 2018-10-04 LAB — CBC
HCT: 35.2 % — ABNORMAL LOW (ref 36.0–46.0)
Hemoglobin: 11.6 g/dL — ABNORMAL LOW (ref 12.0–15.0)
MCHC: 32.9 g/dL (ref 30.0–36.0)
MCV: 97.4 fl (ref 78.0–100.0)
Platelets: 266 10*3/uL (ref 150.0–400.0)
RBC: 3.61 Mil/uL — ABNORMAL LOW (ref 3.87–5.11)
RDW: 13.6 % (ref 11.5–15.5)
WBC: 6.8 10*3/uL (ref 4.0–10.5)

## 2018-10-04 LAB — TSH: TSH: 1.2 u[IU]/mL (ref 0.35–4.50)

## 2018-10-04 LAB — VITAMIN B12: Vitamin B-12: 355 pg/mL (ref 211–911)

## 2018-10-04 NOTE — Addendum Note (Signed)
Addended by: Caffie Pinto on: 10/04/2018 02:01 PM   Modules accepted: Orders

## 2018-10-07 ENCOUNTER — Other Ambulatory Visit: Payer: Self-pay | Admitting: Family Medicine

## 2018-10-07 MED FILL — ZOLPIDEM TARTRATE 5 MG TAB: 5 | 90 days supply | Qty: 90 | Fill #0

## 2018-10-07 NOTE — Telephone Encounter (Signed)
Requesting:ambien  Contract:yes UDS:n/a Last OV:09/24/18 Next OV:02/06/19 Last Refill:04/18/18  #[id number] Database:   Please advise

## 2018-10-10 DIAGNOSIS — M1711 Unilateral primary osteoarthritis, right knee: Secondary | ICD-10-CM | POA: Diagnosis not present

## 2018-10-10 DIAGNOSIS — M1712 Unilateral primary osteoarthritis, left knee: Secondary | ICD-10-CM | POA: Diagnosis not present

## 2018-10-10 DIAGNOSIS — M170 Bilateral primary osteoarthritis of knee: Secondary | ICD-10-CM | POA: Diagnosis not present

## 2018-10-16 ENCOUNTER — Telehealth: Payer: Self-pay

## 2018-10-16 NOTE — Telephone Encounter (Signed)
Re mailed paper work to patient

## 2018-10-16 NOTE — Telephone Encounter (Signed)
Copied from [place] [id number]. Topic: General - Inquiry >> Oct 15, 2018  3:33 PM Virl Axe D wrote: Reason for CRM: Pt stated she did not receive lab results in the mail. She wants a callback with the results as soon as possible. Please advise.

## 2018-10-28 ENCOUNTER — Other Ambulatory Visit: Payer: Self-pay | Admitting: Family Medicine

## 2018-10-28 MED FILL — MELOXICAM 15 MG TABLET: 15 | 30 days supply | Qty: 30 | Fill #1

## 2018-10-29 MED FILL — GABAPENTIN 300 MG CAPSULE: 300 | 90 days supply | Qty: 90 | Fill #0

## 2018-11-04 MED FILL — LEVOCETIRIZINE 5 MG TABLET: 5 | 90 days supply | Qty: 90 | Fill #0

## 2018-11-08 MED FILL — ALPRAZolam 1 MG TABS: 1 | 90 days supply | Qty: 90 | Fill #1

## 2018-11-08 MED FILL — MONTELUKAST SOD 10 MG TAB: 10 | 30 days supply | Qty: 30 | Fill #0

## 2018-11-27 MED FILL — HEMOCYTE PLUS CAPSULE: 106-1 | 30 days supply | Qty: 60 | Fill #4

## 2018-12-03 ENCOUNTER — Ambulatory Visit (INDEPENDENT_AMBULATORY_CARE_PROVIDER_SITE_OTHER): Payer: PPO | Admitting: Medical

## 2018-12-03 ENCOUNTER — Other Ambulatory Visit: Payer: Self-pay

## 2018-12-03 ENCOUNTER — Encounter: Payer: Self-pay | Admitting: Medical

## 2018-12-03 DIAGNOSIS — R300 Dysuria: Secondary | ICD-10-CM

## 2018-12-03 DIAGNOSIS — N390 Urinary tract infection, site not specified: Secondary | ICD-10-CM

## 2018-12-03 LAB — POC URINALSYSI DIPSTICK (AUTOMATED)
Bilirubin, UA: NEGATIVE
Glucose, UA: NEGATIVE
Ketones, UA: NEGATIVE
Nitrite, UA: POSITIVE
Protein, UA: POSITIVE — AB
Spec Grav, UA: 1.01 (ref 1.010–1.025)
Urobilinogen, UA: 0.2 E.U./dL
pH, UA: 7 (ref 5.0–8.0)

## 2018-12-03 MED ORDER — CEPHALEXIN 500 MG PO CAPS: 500.0000 mg | ORAL_CAPSULE | Freq: Two times a day (BID) | ORAL | 0 refills | Status: DC

## 2018-12-03 MED FILL — CEPHALEXIN 500 MG CAPSULE: 500 | 7 days supply | Qty: 14 | Fill #0

## 2018-12-03 MED FILL — FLUTICASONE PROP 50 MCG SPR: 50 | 30 days supply | Qty: 16 | Fill #0

## 2018-12-03 MED FILL — MELOXICAM 15 MG TABLET: 15 | 30 days supply | Qty: 30 | Fill #0

## 2018-12-03 NOTE — Patient Instructions (Addendum)
Your appear to have a urinary tract infection. I am prescribing keflex  antibiotic for the probable infection. Hydrate well. I am sending out a urine culture. During the interim if your signs and symptoms worsen rather than improving please notify us. We will notify your when the culture results are back.  Can use pyridium which you have for urinary pain.  Pt scheduled to drop off urine sample for culture today at 2:30. Then she will start keflex.  Follow up in 7 days or as needed.

## 2018-12-03 NOTE — Progress Notes (Signed)
   Subjective:    Patient ID: Alexis Olson, female    DOB: May 26, 1933, 85 y.o.   MRN: [id number]  HPI  Virtual Visit via Telephone Note  I connected with Alexis Olson on 12/03/18 at  1:40 PM EDT by telephone and verified that I am speaking with the correct person using two identifiers.  Location: Patient: home Provider: office.   I discussed the limitations, risks, security and privacy concerns of performing an evaluation and management service by telephone and the availability of in person appointments. I also discussed with the patient that there may be a patient responsible charge related to this service. The patient expressed understanding and agreed to proceed.  Pt did not check her vitals today.   History of Present Illness: Pt states hx of uti in the past on and off over the years.   Pt states hurts for her to urinate and her urine does have obvious odor.   Pt in past does respond to keflex. Pt has no nausea, no vomiting, no fever or chills. A lot of allergies/side effects to other antibiotics.  Also recently urinating about 4-5 times at night.(symptoms started about Friday past week.  Urine does look cloudy.     Observations/Objective: General- no acute distress, pleasant. Oriented. Normal speech.  Assessment and Plan: Your appear to have a urinary tract infection. I am prescribing keflex  antibiotic for the probable infection. Hydrate well. I am sending out a urine culture. During the interim if your signs and symptoms worsen rather than improving please notify us. We will notify your when the culture results are back.  Can use pyridium which you have for urinary pain.  Follow up in 7 days or as needed.  Follow Up Instructions:    I discussed the assessment and treatment plan with the patient. The patient was provided an opportunity to ask questions and all were answered. The patient agreed with the plan and demonstrated an understanding of the instructions.  The patient was advised to call back or seek an in-person evaluation if the symptoms worsen or if the condition fails to improve as anticipated.  I provided 15  minutes of non-face-to-face time during this encounter.   Mackie Pai, PA-C    Review of Systems  Constitutional: Negative for chills, fatigue and fever.  Respiratory: Negative for chest tightness, shortness of breath and wheezing.   Cardiovascular: Negative for chest pain and palpitations.  Gastrointestinal: Negative for abdominal distention, abdominal pain, blood in stool, diarrhea and nausea.  Genitourinary: Positive for dysuria, frequency and urgency. Negative for difficulty urinating, vaginal bleeding and vaginal pain.       Odor to urine.  Musculoskeletal: Negative for back pain.  Hematological: Negative for adenopathy. Does not bruise/bleed easily.  Psychiatric/Behavioral: Negative for behavioral problems.       Objective:   Physical Exam        Assessment & Plan:

## 2018-12-04 ENCOUNTER — Other Ambulatory Visit: Payer: Self-pay | Admitting: Internal Medicine

## 2018-12-04 MED FILL — POTASSIUM CHLORIDE CRYS ER: 20 | 90 days supply | Qty: 90 | Fill #1

## 2018-12-04 NOTE — Telephone Encounter (Signed)
Office visit in 04/2018 may I refill Sir? Thank you.

## 2018-12-05 LAB — URINE CULTURE
MICRO NUMBER:: 855760
SPECIMEN QUALITY:: ADEQUATE

## 2018-12-05 MED FILL — DICYCLOMINE 10 MG CAPSULE: 10 | 90 days supply | Qty: 270 | Fill #0

## 2018-12-05 NOTE — Telephone Encounter (Signed)
Refilled

## 2018-12-07 MED FILL — MONTELUKAST SOD 10 MG TAB: 10 | 30 days supply | Qty: 30 | Fill #1

## 2018-12-10 ENCOUNTER — Other Ambulatory Visit: Payer: Self-pay | Admitting: Medical

## 2018-12-11 ENCOUNTER — Telehealth: Payer: Self-pay | Admitting: *Deleted

## 2018-12-11 NOTE — Telephone Encounter (Signed)
Patient saw you for UTI  Called patient and she stated that she has finished up the Keflex and it hurt just a little now.  Do you want her to follow up with you or can she do the rocephin injection?

## 2018-12-11 NOTE — Telephone Encounter (Signed)
Verify with her can use rocephin/that she has used before. Should be able to since same class as keflex.   If so then 1 gram rocephin IM. Do 15 minutes wait. Collect urine for culture. She does not need to see me. If she wants can schedule tomorrow with me but I am only here in morning.

## 2018-12-11 NOTE — Telephone Encounter (Signed)
Copied from [place] [id number]. Topic: Quick Communication - See Telephone Encounter >> Dec 10, 2018  3:52 PM Loma Boston wrote: CRM for notification. See Telephone encounter for: 12/10/18. Pt says the med she was given for urniary has helped some but it is gone and she either needs a refill or the shot you were talking about. Pt wants FU to know what to do. [phone] [phone]

## 2018-12-12 ENCOUNTER — Other Ambulatory Visit: Payer: Self-pay | Admitting: *Deleted

## 2018-12-12 ENCOUNTER — Other Ambulatory Visit: Payer: Self-pay

## 2018-12-12 ENCOUNTER — Other Ambulatory Visit (INDEPENDENT_AMBULATORY_CARE_PROVIDER_SITE_OTHER): Payer: PPO

## 2018-12-12 ENCOUNTER — Ambulatory Visit (INDEPENDENT_AMBULATORY_CARE_PROVIDER_SITE_OTHER): Payer: PPO | Admitting: *Deleted

## 2018-12-12 DIAGNOSIS — N390 Urinary tract infection, site not specified: Secondary | ICD-10-CM | POA: Diagnosis not present

## 2018-12-12 DIAGNOSIS — R300 Dysuria: Secondary | ICD-10-CM

## 2018-12-12 MED ORDER — CEFTRIAXONE SODIUM 1 G IJ SOLR
1.0000 g | Freq: Once | INTRAMUSCULAR | Status: AC
  Administered 2018-12-12: 1 g via INTRAMUSCULAR

## 2018-12-12 NOTE — Progress Notes (Addendum)
Patient here for rocephin injection per Percell Miller.  Injection given and patient tolerated well.  Patient did also leave a urine for culture.  Culture was sent.   I did give order for both rocephin injection and urine culture.  Mackie Pai, PA-C

## 2018-12-12 NOTE — Telephone Encounter (Signed)
Patient will be in today for injection

## 2018-12-13 LAB — URINE CULTURE
MICRO NUMBER:: 893132
SPECIMEN QUALITY:: ADEQUATE

## 2018-12-16 MED FILL — OMEPRAZOLE 20 MG CAP: 20 | 90 days supply | Qty: 90 | Fill #2

## 2018-12-18 MED FILL — AMITRIPTYLINE HCL 100 MG TA: 100 | 90 days supply | Qty: 90 | Fill #1

## 2018-12-30 MED FILL — DICLOFENAC SODIUM 1 % GEL: 1 | 25 days supply | Qty: 400 | Fill #0

## 2019-01-01 MED FILL — MELOXICAM 15 MG TABLET: 15 | 30 days supply | Qty: 30 | Fill #1

## 2019-01-07 MED FILL — hydrOXYzine HCL 25 MG TABS: 25 | 30 days supply | Qty: 90 | Fill #1

## 2019-01-07 MED FILL — MONTELUKAST SOD 10 MG TAB: 10 | 90 days supply | Qty: 90 | Fill #0

## 2019-01-08 MED FILL — FLUTICASONE PROP 50 MCG SPR: 50 | 30 days supply | Qty: 16 | Fill #1

## 2019-01-10 MED FILL — VIT D2 1.25 MG (50,000 UNIT: 1.25 MG | 84 days supply | Qty: 12 | Fill #1

## 2019-01-10 MED FILL — ALENDRONATE NA 70 MG TAB: 70 | 28 days supply | Qty: 4 | Fill #0

## 2019-01-13 MED FILL — ZOLPIDEM TARTRATE 5 MG TAB: 5 | 90 days supply | Qty: 90 | Fill #1

## 2019-01-20 MED FILL — HEMOCYTE PLUS CAPSULE: 106-1 | 30 days supply | Qty: 60 | Fill #5

## 2019-01-21 DIAGNOSIS — M25561 Pain in right knee: Secondary | ICD-10-CM | POA: Diagnosis not present

## 2019-01-21 DIAGNOSIS — M1712 Unilateral primary osteoarthritis, left knee: Secondary | ICD-10-CM | POA: Diagnosis not present

## 2019-01-21 DIAGNOSIS — M1711 Unilateral primary osteoarthritis, right knee: Secondary | ICD-10-CM | POA: Diagnosis not present

## 2019-01-21 DIAGNOSIS — M170 Bilateral primary osteoarthritis of knee: Secondary | ICD-10-CM | POA: Diagnosis not present

## 2019-01-21 DIAGNOSIS — M25562 Pain in left knee: Secondary | ICD-10-CM | POA: Diagnosis not present

## 2019-01-29 MED FILL — LEVOCETIRIZINE 5 MG TABLET: 5 | 90 days supply | Qty: 90 | Fill #0

## 2019-01-31 MED FILL — MELOXICAM 15 MG TABLET: 15 | 30 days supply | Qty: 30 | Fill #0

## 2019-02-04 ENCOUNTER — Other Ambulatory Visit: Payer: Self-pay | Admitting: Family Medicine

## 2019-02-04 MED FILL — GABAPENTIN 300 MG CAPSULE: 300 | 90 days supply | Qty: 90 | Fill #0

## 2019-02-05 ENCOUNTER — Other Ambulatory Visit: Payer: Self-pay

## 2019-02-06 ENCOUNTER — Ambulatory Visit (INDEPENDENT_AMBULATORY_CARE_PROVIDER_SITE_OTHER): Payer: PPO | Admitting: Family Medicine

## 2019-02-06 ENCOUNTER — Ambulatory Visit (HOSPITAL_BASED_OUTPATIENT_CLINIC_OR_DEPARTMENT_OTHER)
Admission: RE | Admit: 2019-02-06 | Discharge: 2019-02-06 | Disposition: A | Discharge status: Home / Self Care | Payer: PPO | Source: Ambulatory Visit | Attending: Family Medicine | Admitting: Family Medicine

## 2019-02-06 ENCOUNTER — Other Ambulatory Visit: Payer: Self-pay

## 2019-02-06 VITALS — BP 120/70 | HR 99 | Temp 98.6°F | Resp 18 | Wt 182.4 lb

## 2019-02-06 DIAGNOSIS — Z0000 Encounter for general adult medical examination without abnormal findings: Secondary | ICD-10-CM

## 2019-02-06 DIAGNOSIS — M5489 Other dorsalgia: Secondary | ICD-10-CM | POA: Diagnosis not present

## 2019-02-06 DIAGNOSIS — E538 Deficiency of other specified B group vitamins: Secondary | ICD-10-CM

## 2019-02-06 DIAGNOSIS — M810 Age-related osteoporosis without current pathological fracture: Secondary | ICD-10-CM | POA: Diagnosis not present

## 2019-02-06 DIAGNOSIS — G4700 Insomnia, unspecified: Secondary | ICD-10-CM

## 2019-02-06 DIAGNOSIS — E785 Hyperlipidemia, unspecified: Secondary | ICD-10-CM

## 2019-02-06 DIAGNOSIS — E871 Hypo-osmolality and hyponatremia: Secondary | ICD-10-CM

## 2019-02-06 DIAGNOSIS — M546 Pain in thoracic spine: Secondary | ICD-10-CM | POA: Diagnosis not present

## 2019-02-06 MED ORDER — ACETAMINOPHEN-CODEINE #3 300-30 MG PO TABS: 1.0000 | ORAL_TABLET | Freq: Three times a day (TID) | ORAL | 0 refills | Status: DC | PRN

## 2019-02-06 MED ORDER — METHOCARBAMOL 500 MG PO TABS: 500.0000 mg | ORAL_TABLET | Freq: Three times a day (TID) | ORAL | 1 refills | Status: DC | PRN

## 2019-02-06 MED FILL — ACETAMINOPHEN/COD #3 TABLET: 300-30 | 7 days supply | Qty: 21 | Fill #0

## 2019-02-06 MED FILL — METHOCARBAMOL 500 MG TABLET: 500 | 30 days supply | Qty: 90 | Fill #0

## 2019-02-06 NOTE — Patient Instructions (Addendum)
Encouraged increased hydration and fiber in diet. Daily probiotics. If bowels not moving can use MOM 2 tbls po in 4 oz of warm prune juice by mouth every 2-3 days. If no results then repeat in 4 hours with  Dulcolax suppository pr, may repeat again in 4 more hours as needed. Seek care if symptoms worsen. Consider daily Miralax  With benefiber and/or Dulcolax if symptoms persist.    Preventive Care 65 Years and Older, Female Preventive care refers to lifestyle choices and visits with your health care provider that can promote health and wellness. This includes:  A yearly physical exam. This is also called an annual well check.  Regular dental and eye exams.  Immunizations.  Screening for certain conditions.  Healthy lifestyle choices, such as diet and exercise. What can I expect for my preventive care visit? Physical exam Your health care provider will check:  Height and weight. These may be used to calculate body mass index (BMI), which is a measurement that tells if you are at a healthy weight.  Heart rate and blood pressure.  Your skin for abnormal spots. Counseling Your health care provider may ask you questions about:  Alcohol, tobacco, and drug use.  Emotional well-being.  Home and relationship well-being.  Sexual activity.  Eating habits.  History of falls.  Memory and ability to understand (cognition).  Work and work Statistician.  Pregnancy and menstrual history. What immunizations do I need?  Influenza (flu) vaccine  This is recommended every year. Tetanus, diphtheria, and pertussis (Tdap) vaccine  You may need a Td booster every 10 years. Varicella (chickenpox) vaccine  You may need this vaccine if you have not already been vaccinated. Zoster (shingles) vaccine  You may need this after age 20. Pneumococcal conjugate (PCV13) vaccine  One dose is recommended after age 14. Pneumococcal polysaccharide (PPSV23) vaccine  One dose is recommended after  age 36. Measles, mumps, and rubella (MMR) vaccine  You may need at least one dose of MMR if you were born in 1957 or later. You may also need a second dose. Meningococcal conjugate (MenACWY) vaccine  You may need this if you have certain conditions. Hepatitis A vaccine  You may need this if you have certain conditions or if you travel or work in places where you may be exposed to hepatitis A. Hepatitis B vaccine  You may need this if you have certain conditions or if you travel or work in places where you may be exposed to hepatitis B. Haemophilus influenzae type b (Hib) vaccine  You may need this if you have certain conditions. You may receive vaccines as individual doses or as more than one vaccine together in one shot (combination vaccines). Talk with your health care provider about the risks and benefits of combination vaccines. What tests do I need? Blood tests  Lipid and cholesterol levels. These may be checked every 5 years, or more frequently depending on your overall health.  Hepatitis C test.  Hepatitis B test. Screening  Lung cancer screening. You may have this screening every year starting at age 36 if you have a 30-pack-year history of smoking and currently smoke or have quit within the past 15 years.  Colorectal cancer screening. All adults should have this screening starting at age 41 and continuing until age 56. Your health care provider may recommend screening at age 52 if you are at increased risk. You will have tests every 1-10 years, depending on your results and the type of screening test.  Diabetes screening. This is done by checking your blood sugar (glucose) after you have not eaten for a while (fasting). You may have this done every 1-3 years.  Mammogram. This may be done every 1-2 years. Talk with your health care provider about how often you should have regular mammograms.  BRCA-related cancer screening. This may be done if you have a family history of  breast, ovarian, tubal, or peritoneal cancers. Other tests  Sexually transmitted disease (STD) testing.  Bone density scan. This is done to screen for osteoporosis. You may have this done starting at age 55. Follow these instructions at home: Eating and drinking  Eat a diet that includes fresh fruits and vegetables, whole grains, lean protein, and low-fat dairy products. Limit your intake of foods with high amounts of sugar, saturated fats, and salt.  Take vitamin and mineral supplements as recommended by your health care provider.  Do not drink alcohol if your health care provider tells you not to drink.  If you drink alcohol: ? Limit how much you have to 0-1 drink a day. ? Be aware of how much alcohol is in your drink. In the [place] one drink equals one 12 oz bottle of beer (355 mL), one 5 oz glass of wine (148 mL), or one 1 oz glass of hard liquor (44 mL). Lifestyle  Take daily care of your teeth and gums.  Stay active. Exercise for at least 30 minutes on 5 or more days each week.  Do not use any products that contain nicotine or tobacco, such as cigarettes, e-cigarettes, and chewing tobacco. If you need help quitting, ask your health care provider.  If you are sexually active, practice safe sex. Use a condom or other form of protection in order to prevent STIs (sexually transmitted infections).  Talk with your health care provider about taking a low-dose aspirin or statin. What's next?  Go to your health care provider once a year for a well check visit.  Ask your health care provider how often you should have your eyes and teeth checked.  Stay up to date on all vaccines. This information is not intended to replace advice given to you by your health care provider. Make sure you discuss any questions you have with your health care provider. Document Released: 04/09/2015 Document Revised: 03/07/2018 Document Reviewed: 03/07/2018 Elsevier Patient Education  2020 [place] 65 Years and Older, Female Preventive care refers to lifestyle choices and visits with your health care provider that can promote health and wellness. This includes:  A yearly physical exam. This is also called an annual well check.  Regular dental and eye exams.  Immunizations.  Screening for certain conditions.  Healthy lifestyle choices, such as diet and exercise. What can I expect for my preventive care visit? Physical exam Your health care provider will check:  Height and weight. These may be used to calculate body mass index (BMI), which is a measurement that tells if you are at a healthy weight.  Heart rate and blood pressure.  Your skin for abnormal spots. Counseling Your health care provider may ask you questions about:  Alcohol, tobacco, and drug use.  Emotional well-being.  Home and relationship well-being.  Sexual activity.  Eating habits.  History of falls.  Memory and ability to understand (cognition).  Work and work Statistician.  Pregnancy and menstrual history. What immunizations do I need?  Influenza (flu) vaccine  This is recommended every year. Tetanus, diphtheria, and pertussis (Tdap) vaccine  You may need a Td booster every 10 years. Varicella (chickenpox) vaccine  You may need this vaccine if you have not already been vaccinated. Zoster (shingles) vaccine  You may need this after age 12. Pneumococcal conjugate (PCV13) vaccine  One dose is recommended after age 14. Pneumococcal polysaccharide (PPSV23) vaccine  One dose is recommended after age 64. Measles, mumps, and rubella (MMR) vaccine  You may need at least one dose of MMR if you were born in 1957 or later. You may also need a second dose. Meningococcal conjugate (MenACWY) vaccine  You may need this if you have certain conditions. Hepatitis A vaccine  You may need this if you have certain conditions or if you travel or work in places where you may be  exposed to hepatitis A. Hepatitis B vaccine  You may need this if you have certain conditions or if you travel or work in places where you may be exposed to hepatitis B. Haemophilus influenzae type b (Hib) vaccine  You may need this if you have certain conditions. You may receive vaccines as individual doses or as more than one vaccine together in one shot (combination vaccines). Talk with your health care provider about the risks and benefits of combination vaccines. What tests do I need? Blood tests  Lipid and cholesterol levels. These may be checked every 5 years, or more frequently depending on your overall health.  Hepatitis C test.  Hepatitis B test. Screening  Lung cancer screening. You may have this screening every year starting at age 4 if you have a 30-pack-year history of smoking and currently smoke or have quit within the past 15 years.  Colorectal cancer screening. All adults should have this screening starting at age 45 and continuing until age 75. Your health care provider may recommend screening at age 28 if you are at increased risk. You will have tests every 1-10 years, depending on your results and the type of screening test.  Diabetes screening. This is done by checking your blood sugar (glucose) after you have not eaten for a while (fasting). You may have this done every 1-3 years.  Mammogram. This may be done every 1-2 years. Talk with your health care provider about how often you should have regular mammograms.  BRCA-related cancer screening. This may be done if you have a family history of breast, ovarian, tubal, or peritoneal cancers. Other tests  Sexually transmitted disease (STD) testing.  Bone density scan. This is done to screen for osteoporosis. You may have this done starting at age 40. Follow these instructions at home: Eating and drinking  Eat a diet that includes fresh fruits and vegetables, whole grains, lean protein, and low-fat dairy products.  Limit your intake of foods with high amounts of sugar, saturated fats, and salt.  Take vitamin and mineral supplements as recommended by your health care provider.  Do not drink alcohol if your health care provider tells you not to drink.  If you drink alcohol: ? Limit how much you have to 0-1 drink a day. ? Be aware of how much alcohol is in your drink. In the [place] one drink equals one 12 oz bottle of beer (355 mL), one 5 oz glass of wine (148 mL), or one 1 oz glass of hard liquor (44 mL). Lifestyle  Take daily care of your teeth and gums.  Stay active. Exercise for at least 30 minutes on 5 or more days each week.  Do not use any products that contain nicotine or  tobacco, such as cigarettes, e-cigarettes, and chewing tobacco. If you need help quitting, ask your health care provider.  If you are sexually active, practice safe sex. Use a condom or other form of protection in order to prevent STIs (sexually transmitted infections).  Talk with your health care provider about taking a low-dose aspirin or statin. What's next?  Go to your health care provider once a year for a well check visit.  Ask your health care provider how often you should have your eyes and teeth checked.  Stay up to date on all vaccines. This information is not intended to replace advice given to you by your health care provider. Make sure you discuss any questions you have with your health care provider. Document Released: 04/09/2015 Document Revised: 03/07/2018 Document Reviewed: 03/07/2018 Elsevier Patient Education  2020 [employer].

## 2019-02-06 NOTE — Assessment & Plan Note (Signed)
Supplement and monitor 

## 2019-02-06 NOTE — Assessment & Plan Note (Signed)
Patient encouraged to maintain heart healthy diet, regular exercise, adequate sleep. Consider daily probiotics. Take medications as prescribed. Labs ordered and reviewed 

## 2019-02-06 NOTE — Assessment & Plan Note (Signed)
Encouraged to get adequate exercise, calcium and vitamin d intake 

## 2019-02-06 NOTE — Assessment & Plan Note (Signed)
Encouraged good sleep hygiene such as dark, quiet room. No blue/green glowing lights such as computer screens in bedroom. No alcohol or stimulants in evening. Cut down on caffeine as able. Regular exercise is helpful but not just prior to bed time.  Ambien prn 

## 2019-02-06 NOTE — Assessment & Plan Note (Addendum)
Encouraged moist heat and gentle stretching as tolerated. May try NSAIDs and prescription meds as directed and report if symptoms worsen or seek immediate care. Xray reveals a T7 minimal compression fracture spoke with radiology and it is not a candidate for correction.

## 2019-02-06 NOTE — Assessment & Plan Note (Signed)
Encouraged heart healthy diet, increase exercise, avoid trans fats, consider a krill oil cap daily 

## 2019-02-07 LAB — CBC
HCT: 36.6 % (ref 36.0–46.0)
Hemoglobin: 11.9 g/dL — ABNORMAL LOW (ref 12.0–15.0)
MCHC: 32.6 g/dL (ref 30.0–36.0)
MCV: 97.4 fl (ref 78.0–100.0)
Platelets: 255 10*3/uL (ref 150.0–400.0)
RBC: 3.76 Mil/uL — ABNORMAL LOW (ref 3.87–5.11)
RDW: 13.9 % (ref 11.5–15.5)
WBC: 8.5 10*3/uL (ref 4.0–10.5)

## 2019-02-07 LAB — COMPREHENSIVE METABOLIC PANEL
ALT: 14 U/L (ref 0–35)
AST: 13 U/L (ref 0–37)
Albumin: 4.3 g/dL (ref 3.5–5.2)
Alkaline Phosphatase: 52 U/L (ref 39–117)
BUN: 19 mg/dL (ref 6–23)
CO2: 28 mEq/L (ref 19–32)
Calcium: 9.3 mg/dL (ref 8.4–10.5)
Chloride: 101 mEq/L (ref 96–112)
Creatinine, Ser: 0.59 mg/dL (ref 0.40–1.20)
GFR: 97.16 mL/min (ref >60.00)
Glucose, Bld: 68 mg/dL — ABNORMAL LOW (ref 70–99)
Potassium: 4.1 mEq/L (ref 3.5–5.1)
Sodium: 135 mEq/L (ref 135–145)
Total Bilirubin: 0.3 mg/dL (ref 0.2–1.2)
Total Protein: 6.6 g/dL (ref 6.0–8.3)

## 2019-02-07 LAB — LIPID PANEL
Cholesterol: 151 mg/dL (ref 0–200)
HDL: 49.8 mg/dL (ref >39.00)
LDL Cholesterol: 68 mg/dL (ref 0–99)
NonHDL: 100.96
Total CHOL/HDL Ratio: 3
Triglycerides: 164 mg/dL — ABNORMAL HIGH (ref 0.0–149.0)
VLDL: 32.8 mg/dL (ref 0.0–40.0)

## 2019-02-07 LAB — TSH: TSH: 0.9 u[IU]/mL (ref 0.35–4.50)

## 2019-02-07 LAB — VITAMIN B12: Vitamin B-12: 1031 pg/mL — ABNORMAL HIGH (ref 211–911)

## 2019-02-09 NOTE — Progress Notes (Signed)
Subjective:    Patient ID: Alexis Olson, female    DOB: 1934/03/07, 85 y.o.   MRN: [id number]  No chief complaint on file.   HPI Patient is in today for annual preventative exam and follow up on chronic medical cocnerns including hypertension, vitamin B12 deficiency and more. She is concerned about some thoracic pain she has been experiencing recently. She denies any trauma or falls. The pain is centered over the spine and does not radiate. No recent febrile illness or hospitalizations. She has been stressed over the recent hip fracture and surgery her husband has been through. She is stressed by the amount of work she has had to do but over all feels she is managing. They are maintaining quarantine. She is trying to maintain a heart healthy diet. Denies CP/palp/SOB/HA/congestion/fevers/GI or GU c/o. Taking meds as prescribed  Past Medical History:  Diagnosis Date  . ALLERGIC RHINITIS   . ANEMIA, CHRONIC    Malabsorption related to bypass hx, B12 and iron deficiency  . ANXIETY   . ASTHMA   . Bariatric surgery status   . C. difficile diarrhea   . Colon perforation ([place]) 08/2014   following polypetomy during colo, surgical repair  . COLONIC POLYPS, ADENOMATOUS, HX OF 2010  . DIVERTICULOSIS, COLON   . GERD (gastroesophageal reflux disease) 08/23/2015  . HSV (herpes simplex virus) infection 08/16/2015  . Hypertension   . Hyponatremia 05/02/2016  . INSOMNIA   . Lumbosacral disc disease    Chronic pain; History of osteomyelitis 2010 following ESI complication  . Obesity   . Pelvic fracture ([place])   . Recurrent UTI 08/23/2015  . URINARY URGENCY   . Ventral hernia without obstruction or gangrene 05/22/2018  . VITAMIN B12 DEFICIENCY     Past Surgical History:  Procedure Laterality Date  . [place]   for ruptured disc  . CHOLECYSTECTOMY    . greenfield filter    . ileojejunal bypass  1976   for obesity  . LAPAROTOMY N/A 09/18/2014   Procedure: EXPLORATORY LAPAROTOMY WITH  REPAIR OF CECIAL PERFORATION;  Surgeon: Autumn Messing III, MD;  Location: [facility];  Service: General;  Laterality: N/A;  . TONSILLECTOMY    . VASCULAR SURGERY Right    revascularization right lower extremity    Family History  Problem Relation Age of Onset  . Cervical cancer Mother   . Stroke Mother   . Liver disease Sister   . Kidney disease Sister   . Cirrhosis Sister   . Colon cancer Neg Hx   . Esophageal cancer Neg Hx   . Rectal cancer Neg Hx   . Stomach cancer Neg Hx     Social History   Socioeconomic History  . Marital status: Married    Spouse name: Not on file  . Number of children: 3  . Years of education: Not on file  . Highest education level: Not on file  Occupational History  . Occupation: retired Programmer, multimedia: RETIRED  Social Needs  . Financial resource strain: Not on file  . Food insecurity    Worry: Not on file    Inability: Not on file  . Transportation needs    Medical: Not on file    Non-medical: Not on file  Tobacco Use  . Smoking status: Never Smoker  . Smokeless tobacco: Never Used  Substance and Sexual Activity  . Alcohol use: No    Alcohol/week: 0.0 standard drinks  . Drug use: No  .  Sexual activity: Never    Partners: Male  Lifestyle  . Physical activity    Days per week: Not on file    Minutes per session: Not on file  . Stress: Not on file  Relationships  . Social Herbalist on phone: Not on file    Gets together: Not on file    Attends religious service: Not on file    Active member of club or organization: Not on file    Attends meetings of clubs or organizations: Not on file    Relationship status: Not on file  . Intimate partner violence    Fear of current or ex partner: Not on file    Emotionally abused: Not on file    Physically abused: Not on file    Forced sexual activity: Not on file  Other Topics Concern  . Not on file  Social History Narrative   Lives with spouse, Indep ADLs   Supportive family nearby    No dietary restrictions    Outpatient Medications Prior to Visit  Medication Sig Dispense Refill  . acetaminophen-codeine (TYLENOL #3) 300-30 MG tablet Take 1 tablet by mouth every 4 (four) hours as needed for moderate pain or severe pain. 15 tablet 0  . albuterol (PROVENTIL HFA;VENTOLIN HFA) 108 (90 BASE) MCG/ACT inhaler Inhale 1-2 puffs into the lungs every 6 (six) hours as needed for wheezing or shortness of breath.    Marland Kitchen alendronate (FOSAMAX) 70 MG tablet Take 1 tablet (70 mg total) by mouth every 7 (seven) days. Take with a full glass of water on an empty stomach. 4 tablet 5  . ALPRAZolam (XANAX) 1 MG tablet TAKE 1/2 TO 1 TABLET BY MOUTH AT BEDTIME AS NEEDED 90 tablet 1  . amitriptyline (ELAVIL) 100 MG tablet TAKE 1 TABLET BY MOUTH DAILY. 90 tablet 1  . azelastine (OPTIVAR) 0.05 % ophthalmic solution Place 1 drop into both eyes daily as needed (for dry eyes).     . benzonatate (TESSALON) 100 MG capsule Take 1 capsule (100 mg total) by mouth 3 (three) times daily as needed for cough. 30 capsule 0  . budesonide-formoterol (SYMBICORT) 160-4.5 MCG/ACT inhaler Inhale 2 puffs into the lungs See admin instructions. Inhale 2 puffs into the lungs one to two times a day 1 Inhaler 2  . cetirizine (ZYRTEC) 10 MG tablet Take 10 mg by mouth daily.     . cholecalciferol (VITAMIN D) 1000 UNITS tablet Take 1,000 Units by mouth daily.    . cyanocobalamin (,VITAMIN B-12,) 1000 MCG/ML injection INJECT 1 ML INTO THE MUSCLE EVERY 30 DAYS. 6 mL 1  . diclofenac sodium (VOLTAREN) 1 % GEL Apply 2-4 g topically See admin instructions. Apply 2-4 grams to both knees once a day  0  . dicyclomine (BENTYL) 10 MG capsule Take 1 capsule (10 mg total) by mouth 3 (three) times daily as needed (for diarrhea & symptoms). 270 capsule 3  . Diphenhyd-Hydrocort-Nystatin (FIRST-DUKES MOUTHWASH) SUSP Take 1 to 2 teaspoonfuls every 4 to 6 hours as directed. 120 mL 2  . diphenoxylate-atropine (LOMOTIL) 2.5-0.025 MG tablet Take 2 tablets  by mouth 3 (three) times daily as needed for diarrhea or loose stools. 150 tablet 0  . Fe Fum-FA-B Cmp-C-Zn-Mg-Mn-Cu (HEMOCYTE PLUS) 106-1 MG CAPS Take 1 capsule by mouth 2 (two) times daily. 60 each 5  . fluticasone (FLONASE) 50 MCG/ACT nasal spray 2 sprays by Nasal route daily.      Marland Kitchen gabapentin (NEURONTIN) 300 MG capsule TAKE 1  CAPSULE BY MOUTH AT BEDTIME 90 capsule 0  . hydrOXYzine (ATARAX) 25 MG tablet Take 25 mg by mouth 3 (three) times daily as needed for itching.     . levocetirizine (XYZAL) 5 MG tablet Take 1 tablet (5 mg total) by mouth every evening. 90 tablet 3  . magic mouthwash SOLN Take 5 mLs by mouth 4 (four) times daily as needed for mouth pain. Swish and Spit 240 mL 1  . magnesium oxide (MAG-OX 400) 400 MG tablet Take 400 mg by mouth 2 (two) times daily.     . meloxicam (MOBIC) 15 MG tablet Take 15 mg by mouth daily.  1  . montelukast (SINGULAIR) 10 MG tablet Take 10 mg by mouth at bedtime.     Marland Kitchen omeprazole (PRILOSEC) 20 MG capsule TAKE 1 CAPSULE BY MOUTH DAILY. 90 capsule 3  . ondansetron (ZOFRAN-ODT) 4 MG disintegrating tablet Take 1 tablet (4 mg total) by mouth 3 (three) times daily as needed for nausea or vomiting. 20 tablet 0  . oxyCODONE-acetaminophen (PERCOCET/ROXICET) 5-325 MG tablet   0  . phenazopyridine (PYRIDIUM) 200 MG tablet phenazopyridine 200 mg tablet  TAKE 1 TABLET BY MOUTH THREE TIMES A DAY AS NEEDED FOR PAIN    . polyethylene glycol powder (MIRALAX) powder Take 17 g by mouth daily. (Patient taking differently: Take 17 g by mouth daily as needed for mild constipation. ) 255 g 0  . potassium chloride SA (K-DUR) 20 MEQ tablet TAKE 1 TABLET BY MOUTH ONCE DAILY 90 tablet 1  . PX ENTERIC ASPIRIN 81 MG EC tablet Take 81 mg by mouth 2 (two) times a week.     . valACYclovir (VALTREX) 500 MG tablet TAKE 1 TABLET (500 MG TOTAL) BY MOUTH AS NEEDED. 30 tablet 5  . Vitamin D, Ergocalciferol, (DRISDOL) 1.25 MG (50000 UT) CAPS capsule Take 1 capsule (50,000 Units total) by  mouth every 7 (seven) days. 12 capsule 4  . vitamin E 400 UNIT capsule Take 400 Units by mouth daily.      Marland Kitchen zolpidem (AMBIEN) 5 MG tablet TAKE 1 TABLET BY MOUTH AT BEDTIME. 90 tablet 1   No facility-administered medications prior to visit.     Allergies  Allergen Reactions  . Ativan [Lorazepam] Anaphylaxis and Other (See Comments)    Deathly allergic; "resp rate dropped dropped to 6"  . Other Anaphylaxis    Red peppers--choking, also  . Azithromycin Diarrhea  . Benadryl [Diphenhydramine Hcl] Other (See Comments)    Paradoxical response  . Ciprofloxacin Hcl Other (See Comments)    Causes yeast infection and refuses to take  . Codeine Phosphate Nausea Only    Can tolerate in limited amounts  . Hydrocodone Itching and Nausea And Vomiting  . Hydrocodone-Homatropine Itching and Nausea And Vomiting  . Levaquin [Levofloxacin In D5w] Other (See Comments)    Muscle soreness  . Levofloxacin   . Meperidine Hcl Other (See Comments)    Reaction ??  . Molds & Smuts Other (See Comments)    unsure  . Sulfa Antibiotics Other (See Comments)    Joint pain  . Tape Other (See Comments)    No Coban wrap (per the patient)  . Ultram [Tramadol Hcl] Other (See Comments)    "Can't move joints"    Review of Systems  Constitutional: Positive for malaise/fatigue. Negative for chills and fever.  HENT: Negative for congestion and hearing loss.   Eyes: Negative for discharge.  Respiratory: Negative for cough, sputum production and shortness of breath.  Cardiovascular: Negative for chest pain, palpitations and leg swelling.  Gastrointestinal: Negative for abdominal pain, blood in stool, constipation, diarrhea, heartburn, nausea and vomiting.  Genitourinary: Negative for dysuria, frequency, hematuria and urgency.  Musculoskeletal: Positive for back pain. Negative for falls and myalgias.  Skin: Negative for rash.  Neurological: Negative for dizziness, sensory change, loss of consciousness, weakness and  headaches.  Endo/Heme/Allergies: Negative for environmental allergies. Does not bruise/bleed easily.  Psychiatric/Behavioral: Negative for depression and suicidal ideas. The patient is not nervous/anxious and does not have insomnia.        Objective:    Physical Exam Constitutional:      General: She is not in acute distress.    Appearance: She is not diaphoretic.  HENT:     Head: Normocephalic and atraumatic.     Right Ear: External ear normal.     Left Ear: External ear normal.     Nose: Nose normal.     Mouth/Throat:     Pharynx: No oropharyngeal exudate.  Eyes:     General: No scleral icterus.       Right eye: No discharge.        Left eye: No discharge.     Conjunctiva/sclera: Conjunctivae normal.     Pupils: Pupils are equal, round, and reactive to light.  Neck:     Musculoskeletal: Normal range of motion and neck supple.     Thyroid: No thyromegaly.  Cardiovascular:     Rate and Rhythm: Normal rate and regular rhythm.     Heart sounds: Normal heart sounds. No murmur.  Pulmonary:     Effort: Pulmonary effort is normal. No respiratory distress.     Breath sounds: Normal breath sounds. No wheezing or rales.  Abdominal:     General: Bowel sounds are normal. There is no distension.     Palpations: Abdomen is soft. There is no mass.     Tenderness: There is no abdominal tenderness.  Musculoskeletal: Normal range of motion.        General: No tenderness.  Lymphadenopathy:     Cervical: No cervical adenopathy.  Skin:    General: Skin is warm and dry.     Findings: No rash.  Neurological:     Mental Status: She is alert and oriented to person, place, and time.     Cranial Nerves: No cranial nerve deficit.     Coordination: Coordination normal.     Deep Tendon Reflexes: Reflexes are normal and symmetric. Reflexes normal.     BP 120/70 (BP Location: Left Arm, Patient Position: Sitting, Cuff Size: Normal)   Pulse 99   Temp 98.6 F (37 C) (Temporal)   Resp 18   Wt  182 lb 6.4 oz (82.7 kg)   SpO2 97%   BMI 31.31 kg/m  Wt Readings from Last 3 Encounters:  02/06/19 182 lb 6.4 oz (82.7 kg)  09/24/18 185 lb (83.9 kg)  05/21/18 186 lb 2 oz (84.4 kg)    Diabetic Foot Exam - Simple   No data filed     Lab Results  Component Value Date   WBC 8.5 02/06/2019   HGB 11.9 (L) 02/06/2019   HCT 36.6 02/06/2019   PLT 255.0 02/06/2019   GLUCOSE 68 (L) 02/06/2019   CHOL 151 02/06/2019   TRIG 164.0 (H) 02/06/2019   HDL 49.80 02/06/2019   LDLDIRECT 76.0 10/04/2018   LDLCALC 68 02/06/2019   ALT 14 02/06/2019   AST 13 02/06/2019   NA 135 02/06/2019  K 4.1 02/06/2019   CL 101 02/06/2019   CREATININE 0.59 02/06/2019   BUN 19 02/06/2019   CO2 28 02/06/2019   TSH 0.90 02/06/2019   INR 1.01 05/13/2009   HGBA1C 5.3 08/03/2006    Lab Results  Component Value Date   TSH 0.90 02/06/2019   Lab Results  Component Value Date   WBC 8.5 02/06/2019   HGB 11.9 (L) 02/06/2019   HCT 36.6 02/06/2019   MCV 97.4 02/06/2019   PLT 255.0 02/06/2019   Lab Results  Component Value Date   NA 135 02/06/2019   K 4.1 02/06/2019   CO2 28 02/06/2019   GLUCOSE 68 (L) 02/06/2019   BUN 19 02/06/2019   CREATININE 0.59 02/06/2019   BILITOT 0.3 02/06/2019   ALKPHOS 52 02/06/2019   AST 13 02/06/2019   ALT 14 02/06/2019   PROT 6.6 02/06/2019   ALBUMIN 4.3 02/06/2019   CALCIUM 9.3 02/06/2019   ANIONGAP 9 09/26/2014   GFR 97.16 02/06/2019   Lab Results  Component Value Date   CHOL 151 02/06/2019   Lab Results  Component Value Date   HDL 49.80 02/06/2019   Lab Results  Component Value Date   LDLCALC 68 02/06/2019   Lab Results  Component Value Date   TRIG 164.0 (H) 02/06/2019   Lab Results  Component Value Date   CHOLHDL 3 02/06/2019   Lab Results  Component Value Date   HGBA1C 5.3 08/03/2006       Assessment & Plan:   Problem List Items Addressed This Visit    Vitamin B 12 deficiency    Supplement and monitor      Relevant Orders    Vitamin B12 (Completed)   Midline back pain    Encouraged moist heat and gentle stretching as tolerated. May try NSAIDs and prescription meds as directed and report if symptoms worsen or seek immediate care. Xray reveals a T7 minimal compression fracture spoke with radiology and it is not a candidate for correction.       Relevant Medications   acetaminophen-codeine (TYLENOL #3) 300-30 MG tablet   methocarbamol (ROBAXIN) 500 MG tablet   Other Relevant Orders   DG Thoracic Spine 2 View (Completed)   INSOMNIA    Encouraged good sleep hygiene such as dark, quiet room. No blue/green glowing lights such as computer screens in bedroom. No alcohol or stimulants in evening. Cut down on caffeine as able. Regular exercise is helpful but not just prior to bed time. Ambien prn      Osteoporosis    Encouraged to get adequate exercise, calcium and vitamin d intake      Hyperlipidemia, mild    Encouraged heart healthy diet, increase exercise, avoid trans fats, consider a krill oil cap daily      Relevant Orders   Lipid panel (Completed)   TSH (Completed)   Hyponatremia - Primary   Relevant Orders   CMP (Completed)   Preventative health care    Patient encouraged to maintain heart healthy diet, regular exercise, adequate sleep. Consider daily probiotics. Take medications as prescribed. Labs ordered and reviewed.       Relevant Orders   CBC (Completed)   CMP (Completed)   TSH (Completed)      I am having Renaldo Fiddler. Laverdiere start on acetaminophen-codeine and methocarbamol. I am also having her maintain her vitamin E, fluticasone, hydrOXYzine, magnesium oxide, azelastine, montelukast, PX Enteric Aspirin, cetirizine, levocetirizine, cholecalciferol, albuterol, polyethylene glycol powder, First-[employer] Mouthwash, diclofenac sodium, meloxicam, acetaminophen-codeine, ondansetron,  oxyCODONE-acetaminophen, budesonide-formoterol, magic mouthwash, phenazopyridine, benzonatate, cyanocobalamin, Hemocyte Plus,  diphenoxylate-atropine, omeprazole, ALPRAZolam, potassium chloride SA, valACYclovir, amitriptyline, alendronate, Vitamin D (Ergocalciferol), zolpidem, dicyclomine, and gabapentin.  Meds ordered this encounter  Medications  . acetaminophen-codeine (TYLENOL #3) 300-30 MG tablet    Sig: Take 1 tablet by mouth every 8 (eight) hours as needed for moderate pain or severe pain.    Dispense:  30 tablet    Refill:  0  . methocarbamol (ROBAXIN) 500 MG tablet    Sig: Take 1 tablet (500 mg total) by mouth every 8 (eight) hours as needed for muscle spasms.    Dispense:  90 tablet    Refill:  1     Penni Homans, MD

## 2019-03-04 ENCOUNTER — Other Ambulatory Visit: Payer: Self-pay | Admitting: Family Medicine

## 2019-03-04 MED FILL — POTASSIUM CHLORIDE CRYS ER: 20 | 90 days supply | Qty: 90 | Fill #0

## 2019-03-04 MED FILL — MELOXICAM 15 MG TABLET: 15 | 30 days supply | Qty: 30 | Fill #1

## 2019-03-06 ENCOUNTER — Telehealth: Payer: Self-pay | Admitting: Family Medicine

## 2019-03-06 NOTE — Telephone Encounter (Signed)
Patient notified  T8

## 2019-03-06 NOTE — Telephone Encounter (Signed)
Reason for CRM: Pt called and is requesting to know where exactly she fractured her back. Please advise.

## 2019-03-10 ENCOUNTER — Other Ambulatory Visit: Payer: Self-pay | Admitting: Family Medicine

## 2019-03-10 NOTE — Telephone Encounter (Signed)
Requesting:xanax Contract:yes UDS:n/a Last OV:02/06/19  Next OV:n/a Last Refill:07/08/18 #90-1rf Database:   Please advise

## 2019-03-11 MED FILL — ALPRAZolam 1 MG TABS: 1 | 90 days supply | Qty: 90 | Fill #0

## 2019-03-12 DIAGNOSIS — J4530 Mild persistent asthma, uncomplicated: Secondary | ICD-10-CM | POA: Diagnosis not present

## 2019-03-12 DIAGNOSIS — J3081 Allergic rhinitis due to animal (cat) (dog) hair and dander: Secondary | ICD-10-CM | POA: Diagnosis not present

## 2019-03-12 DIAGNOSIS — J3089 Other allergic rhinitis: Secondary | ICD-10-CM | POA: Diagnosis not present

## 2019-03-12 DIAGNOSIS — J301 Allergic rhinitis due to pollen: Secondary | ICD-10-CM | POA: Diagnosis not present

## 2019-03-13 ENCOUNTER — Telehealth: Payer: Self-pay

## 2019-03-13 DIAGNOSIS — R350 Frequency of micturition: Secondary | ICD-10-CM

## 2019-03-13 NOTE — Telephone Encounter (Signed)
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 She then got angry and said "I told you I know what's wrong with me and I don't need to see no doctor!" I said "Well then go to an UC because I am unable to help you have a good day."

## 2019-03-14 ENCOUNTER — Other Ambulatory Visit (INDEPENDENT_AMBULATORY_CARE_PROVIDER_SITE_OTHER): Payer: PPO

## 2019-03-14 DIAGNOSIS — R350 Frequency of micturition: Secondary | ICD-10-CM

## 2019-03-14 LAB — URINALYSIS, ROUTINE W REFLEX MICROSCOPIC
Bilirubin Urine: NEGATIVE
Ketones, ur: NEGATIVE
Nitrite: NEGATIVE
Specific Gravity, Urine: 1.01 (ref 1.000–1.030)
Total Protein, Urine: NEGATIVE
Urine Glucose: NEGATIVE
Urobilinogen, UA: 0.2 (ref 0.0–1.0)
pH: 6 (ref 5.0–8.0)

## 2019-03-14 NOTE — Addendum Note (Signed)
Addended by: Caffie Pinto on: 03/14/2019 03:24 PM   Modules accepted: Orders

## 2019-03-14 NOTE — Telephone Encounter (Signed)
Can you arrange for this gal to bring in a urine sample for UTI, get a UA and culture. She just may have to go to [facility] if we are full thanks

## 2019-03-14 NOTE — Telephone Encounter (Signed)
Spoke with patient regarding appt/symptoms.  Per PCP, patient is going to [facility] lab for UA/culture.

## 2019-03-15 ENCOUNTER — Other Ambulatory Visit: Payer: Self-pay | Admitting: Family Medicine

## 2019-03-15 MED ORDER — CEFDINIR 300 MG PO CAPS: 300.0000 mg | ORAL_CAPSULE | Freq: Two times a day (BID) | ORAL | 0 refills | Status: DC

## 2019-03-16 LAB — URINE CULTURE
MICRO NUMBER:: 1212800
SPECIMEN QUALITY:: ADEQUATE

## 2019-03-18 ENCOUNTER — Other Ambulatory Visit: Payer: Self-pay | Admitting: Family Medicine

## 2019-03-18 MED FILL — AMITRIPTYLINE HCL 100 MG TA: 100 | 90 days supply | Qty: 90 | Fill #0

## 2019-03-18 MED FILL — OMEPRAZOLE 20 MG CAP: 20 | 90 days supply | Qty: 90 | Fill #3

## 2019-03-19 MED FILL — SYMBICORT 160-4.5 MCG INH: 160-4.5 | 90 days supply | Qty: 31 | Fill #0

## 2019-03-19 MED FILL — hydrOXYzine HCL 25 MG TABS: 25 | 90 days supply | Qty: 270 | Fill #0

## 2019-03-19 MED FILL — AZELASTINE HCL 0.05 % SOLN: 0.05 | 67 days supply | Qty: 18 | Fill #0

## 2019-03-26 ENCOUNTER — Other Ambulatory Visit: Payer: Self-pay | Admitting: Internal Medicine

## 2019-03-27 MED FILL — HEMOCYTE PLUS CAPSULE: 106-1 | 30 days supply | Qty: 60 | Fill #0

## 2019-04-01 MED FILL — DICLOFENAC SODIUM 1 % GEL: 1 | 25 days supply | Qty: 400 | Fill #1

## 2019-04-03 MED FILL — MELOXICAM 15 MG TABLET: 15 | 90 days supply | Qty: 90 | Fill #0

## 2019-04-04 MED FILL — VIT D2 1.25 MG (50,000 UNIT: 1.25 MG | 84 days supply | Qty: 12 | Fill #2

## 2019-04-07 MED FILL — MONTELUKAST SOD 10 MG TAB: 10 | 90 days supply | Qty: 90 | Fill #0

## 2019-04-10 ENCOUNTER — Other Ambulatory Visit: Payer: Self-pay | Admitting: Internal Medicine

## 2019-04-10 NOTE — Telephone Encounter (Signed)
Please advise about refilling: patient's B12 Sir. Her Vitamin B12 was checked in November and it was high at 1031.

## 2019-04-11 DIAGNOSIS — H26492 Other secondary cataract, left eye: Secondary | ICD-10-CM | POA: Diagnosis not present

## 2019-04-11 DIAGNOSIS — H5213 Myopia, bilateral: Secondary | ICD-10-CM | POA: Diagnosis not present

## 2019-04-11 DIAGNOSIS — H4720 Unspecified optic atrophy: Secondary | ICD-10-CM | POA: Diagnosis not present

## 2019-04-14 DIAGNOSIS — M170 Bilateral primary osteoarthritis of knee: Secondary | ICD-10-CM | POA: Diagnosis not present

## 2019-04-15 ENCOUNTER — Other Ambulatory Visit: Payer: Self-pay | Admitting: Family Medicine

## 2019-04-15 NOTE — Telephone Encounter (Signed)
Requesting:ambien  Contract:yes UDS:n/a Last OV:02/06/19 Next OV:04/20/19 Last Refill: 10/07/18 #[id number] Database:   Please advise

## 2019-04-16 MED FILL — ZOLPIDEM TARTRATE 5 MG TAB: 5 | 90 days supply | Qty: 90 | Fill #0

## 2019-04-16 MED FILL — CYANOCOBALAMIN 1,000 MCG/ML: 1000 | 90 days supply | Qty: 1 | Fill #0

## 2019-04-16 NOTE — Telephone Encounter (Signed)
I have left a detailed message for [name] of the changes and left our # to call back with questions.

## 2019-04-16 NOTE — Telephone Encounter (Signed)
Ok to refill I would tell her she could go to every 3 month shot based upon level

## 2019-04-20 ENCOUNTER — Ambulatory Visit: Payer: PPO | Attending: Internal Medicine

## 2019-04-20 DIAGNOSIS — Z23 Encounter for immunization: Secondary | ICD-10-CM | POA: Insufficient documentation

## 2019-04-20 NOTE — Progress Notes (Signed)
   Covid-19 Vaccination Clinic  Name:  Alexis Olson    MRN: [id number] DOB: 02/28/1934  04/20/2019  [name] was observed post Covid-19 immunization for 15 minutes without incidence. She was provided with Vaccine Information Sheet and instruction to access the V-Safe system.   [name] was instructed to call 911 with any severe reactions post vaccine: Marland Kitchen Difficulty breathing  . Swelling of your face and throat  . A fast heartbeat  . A bad rash all over your body  . Dizziness and weakness    Immunizations Administered    Name Date Dose VIS Date Route   Pfizer COVID-19 Vaccine 04/20/2019  1:06 PM 0.3 mL 03/07/2019 Intramuscular   Manufacturer: [place]   Lot: [id number]   [place]: [id number]

## 2019-04-22 ENCOUNTER — Telehealth: Payer: Self-pay | Admitting: Internal Medicine

## 2019-04-22 NOTE — Telephone Encounter (Signed)
Patient informed that her B12 level in November was high and that is why Dr Carlean Purl said do her injection every 3 months. She verbalized understanding.

## 2019-04-22 NOTE — Telephone Encounter (Signed)
Patient called in reference to Cyanocobalamin that was ordered on 04/16/19. Patient is saying that the pharmacy informed her that it is for every 3 months and she is saying that is incorrect it should be every month.

## 2019-04-29 MED FILL — LEVOCETIRIZINE 5 MG TABLET: 5 | 90 days supply | Qty: 90 | Fill #0

## 2019-05-06 ENCOUNTER — Other Ambulatory Visit: Payer: Self-pay | Admitting: Family Medicine

## 2019-05-06 MED FILL — GABAPENTIN 300 MG CAPSULE: 300 | 90 days supply | Qty: 90 | Fill #0

## 2019-05-12 ENCOUNTER — Ambulatory Visit: Payer: PPO | Attending: Internal Medicine

## 2019-05-12 DIAGNOSIS — Z23 Encounter for immunization: Secondary | ICD-10-CM

## 2019-05-12 NOTE — Progress Notes (Signed)
   Covid-19 Vaccination Clinic  Name:  ANNICE NIBBE    MRN: [id number] DOB: April 24, 1933  05/12/2019  Ms. Bonk was observed post Covid-19 immunization for 30 minutes based on pre-vaccination screening without incidence. She was provided with Vaccine Information Sheet and instruction to access the V-Safe system.   Ms. Crump was instructed to call 911 with any severe reactions post vaccine: Marland Kitchen Difficulty breathing  . Swelling of your face and throat  . A fast heartbeat  . A bad rash all over your body  . Dizziness and weakness    Immunizations Administered    Name Date Dose VIS Date Route   Pfizer COVID-19 Vaccine 05/12/2019  1:22 PM 0.3 mL 03/07/2019 Intramuscular   Manufacturer: [place]   Lot: [id number]   [place]: [id number]

## 2019-05-30 ENCOUNTER — Telehealth: Payer: Self-pay

## 2019-05-30 ENCOUNTER — Other Ambulatory Visit (INDEPENDENT_AMBULATORY_CARE_PROVIDER_SITE_OTHER): Payer: PPO

## 2019-05-30 ENCOUNTER — Encounter: Payer: Self-pay | Admitting: Family Medicine

## 2019-05-30 ENCOUNTER — Ambulatory Visit (INDEPENDENT_AMBULATORY_CARE_PROVIDER_SITE_OTHER): Payer: PPO | Admitting: Family Medicine

## 2019-05-30 ENCOUNTER — Other Ambulatory Visit: Payer: Self-pay | Admitting: Internal Medicine

## 2019-05-30 DIAGNOSIS — N3001 Acute cystitis with hematuria: Secondary | ICD-10-CM

## 2019-05-30 DIAGNOSIS — R300 Dysuria: Secondary | ICD-10-CM

## 2019-05-30 LAB — URINALYSIS, ROUTINE W REFLEX MICROSCOPIC
Bilirubin Urine: NEGATIVE
Ketones, ur: NEGATIVE
Nitrite: NEGATIVE
Specific Gravity, Urine: 1.01 (ref 1.000–1.030)
Urine Glucose: NEGATIVE
Urobilinogen, UA: 0.2 (ref 0.0–1.0)
pH: 6 (ref 5.0–8.0)

## 2019-05-30 MED ORDER — CEPHALEXIN 500 MG PO CAPS: 500.0000 mg | ORAL_CAPSULE | Freq: Two times a day (BID) | ORAL | 0 refills | Status: AC

## 2019-05-30 MED FILL — CEPHALEXIN 500 MG CAPSULE: 500 | 7 days supply | Qty: 14 | Fill #0

## 2019-05-30 NOTE — Progress Notes (Signed)
Virtual Visit via Telephone Note  I connected with Alexis Olson on 05/30/19 at  1:30 PM EST by telephone and verified that I am speaking with the correct person using two identifiers.   I discussed the limitations, risks, security and privacy concerns of performing an evaluation and management service by telephone and the availability of in person appointments. I also discussed with the patient that there may be a patient responsible charge related to this service. The patient expressed understanding and agreed to proceed.  Location patient: home Location provider: work or home office Participants present for the call: patient, provider Patient did not have a visit in the prior 7 days to address this/these issue(s).   History of Present Illness: Pt is an 86 yo female with pmh sig for HTN, seasonal allergies, GERD, h/o diverticulosis, osteoporosis, osteomyelitis, lumbosacral disc dz, vit B12 def., HLD, h/o bariatric surgery, anxiety, insomnia followed by Dr. Randel Pigg.  Pt seen for acute concern.  Endorses dysuria, frequency, suprapubic pain x 2-3 days ago.  Becoming progressively worse.  States has a h/o UTIs.  Denies fever, chills, back pain, n/v.   Pt drinking at least 3 bottles of water per day.  Pt has numerous allergies.  States Cephlasporins typically work.   Observations/Objective: Patient sounds cheerful and well on the phone. I do not appreciate any SOB. Speech and thought processing are grossly intact. Patient reported vitals:   Assessment and Plan: Dysuria  -suspected UTI -pt to give Urine sample at [facility] walk in lab.  -will send in rx if needed based on UA -increased po intake of water and fluids. - Plan: POCT urinalysis dipstick, Urine Culture  Acute cystitis with hematuria -UA with micor: moderate Hgb, large leuks, WBCs TNTC trace protein -will obtain UCx -rx for Keflex 500 mg BID x 7 d sent to pharmacy  F/u prn with pcp  Follow Up Instructions:  I did not  refer this patient for an OV in the next 24 hours for this/these issue(s).  I discussed the assessment and treatment plan with the patient. The patient was provided an opportunity to ask questions and all were answered. The patient agreed with the plan and demonstrated an understanding of the instructions.   The patient was advised to call back or seek an in-person evaluation if the symptoms worsen or if the condition fails to improve as anticipated.  I provided 12 minutes of non-face-to-face time during this encounter.   Billie Ruddy, MD

## 2019-06-01 LAB — URINE CULTURE
MICRO NUMBER:: 10219844
SPECIMEN QUALITY:: ADEQUATE

## 2019-06-02 MED FILL — HEMOCYTE PLUS CAPSULE: 106-1 | 30 days supply | Qty: 60 | Fill #0

## 2019-06-05 MED FILL — POTASSIUM CHLORIDE CRYS ER: 20 | 90 days supply | Qty: 90 | Fill #1

## 2019-06-10 MED FILL — FLUTICASONE PROP 50 MCG SPR: 50 | 30 days supply | Qty: 16 | Fill #0

## 2019-06-16 ENCOUNTER — Other Ambulatory Visit: Payer: Self-pay | Admitting: Internal Medicine

## 2019-06-16 MED FILL — OMEPRAZOLE 20 MG CAP: 20 | 90 days supply | Qty: 90 | Fill #0

## 2019-06-16 MED FILL — AMITRIPTYLINE HCL 100 MG TA: 100 | 90 days supply | Qty: 90 | Fill #1

## 2019-06-17 ENCOUNTER — Ambulatory Visit (INDEPENDENT_AMBULATORY_CARE_PROVIDER_SITE_OTHER): Payer: PPO | Admitting: Medical

## 2019-06-17 ENCOUNTER — Encounter: Payer: Self-pay | Admitting: Medical

## 2019-06-17 ENCOUNTER — Other Ambulatory Visit: Payer: Self-pay

## 2019-06-17 VITALS — BP 141/72 | HR 99

## 2019-06-17 DIAGNOSIS — R197 Diarrhea, unspecified: Secondary | ICD-10-CM | POA: Diagnosis not present

## 2019-06-17 NOTE — Patient Instructions (Signed)
For your recent severe diarrhea and nausea, recommend that you continue to hydrate well with therapies of choice such as Gatorade, ginger ale or propel.  I do recommend propel as a better option since has no sugar.  If you get recurrent nausea you can use Zofran which you have.  Diarrhea appears to be improved today.  If reoccurs then can use Imodium AD.  Recommend eating a bland diet.  I am placing future gastro panel, CBC and CMP.  Going to ask our lab if they can get you scheduled to get those test done tomorrow.  If you have worse or changing signs and symptoms and recommend ED evaluation.  Follow-up date to be determined pending lab review.

## 2019-06-17 NOTE — Progress Notes (Signed)
Subjective:    Patient ID: Alexis Olson, female    DOB: June 11, 1933, 86 y.o.   MRN: [id number]  HPI  Virtual Visit via Telephone Note  I connected with Alexis Olson on 06/17/19 at  4:20 PM EDT by telephone and verified that I am speaking with the correct person using two identifiers.  Location: Patient: Home Provider: Office   I discussed the limitations, risks, security and privacy concerns of performing an evaluation and management service by telephone and the availability of in person appointments. I also discussed with the patient that there may be a patient responsible charge related to this service. The patient expressed understanding and agreed to proceed.   History of Present Illness:   Pt woke up on Saturday morning and feeling nausea. She vomited one time on Saturday morning and then took zofran.  Pt states she is having loose stools. She states loose stools was very watery. Estimate  8-12 loose watery stools Saturday, Sunday and Monday. Today only 4 loose stools. Pt has been drinking gatorade and ginger ail. Now has propel. Pt has not been weighing her self. Pt does have blood pressure and pulse.   Pt feels very washed out and tired.   Sunday t max 99.9.  Pt has no muscles cramps. Yesterday felt light head standing up but no light headed or dizziness today.  No myalgias recently.   Pt had some keflex 3 weeks ago for possible uti. But no progressive loose stools since then.     Observations/Objective: General- no acute distress, pleasant, alert and oriented. Normal speech.  Assessment and Plan: For your recent severe diarrhea and nausea, recommend that you continue to hydrate well with therapies of choice such as Gatorade, ginger ale or propel.  I do recommend propel as a better option since has no sugar.  If you get recurrent nausea you can use Zofran which you have.  Diarrhea appears to be improved today.  If reoccurs then can use Imodium AD.  Recommend eating  a bland diet.  I am placing future gastro panel, CBC and CMP.  Going to ask our lab if they can get you scheduled to get those test done tomorrow.  If you have worse or changing signs and symptoms and recommend ED evaluation.  Follow-up date to be determined pending lab review.  Follow Up Instructions:    I discussed the assessment and treatment plan with the patient. The patient was provided an opportunity to ask questions and all were answered. The patient agreed with the plan and demonstrated an understanding of the instructions.   The patient was advised to call back or seek an in-person evaluation if the symptoms worsen or if the condition fails to improve as anticipated.  I provided 20 minutes of non-face-to-face time during this encounter.   Mackie Pai, PA-C   Review of Systems  Constitutional: Positive for fatigue. Negative for chills and fever.  HENT: Negative for congestion, drooling and ear pain.   Respiratory: Negative for cough, chest tightness, shortness of breath and wheezing.   Cardiovascular: Negative for chest pain and palpitations.  Gastrointestinal: Negative for abdominal pain and blood in stool.  Musculoskeletal: Negative for gait problem, myalgias and neck pain.  Neurological: Negative for tremors, facial asymmetry, speech difficulty, weakness and light-headedness.  Hematological: Negative for adenopathy.  Psychiatric/Behavioral: Negative for behavioral problems and confusion.       Objective:   Physical Exam        Assessment & Plan:

## 2019-06-18 ENCOUNTER — Telehealth: Payer: Self-pay

## 2019-06-18 ENCOUNTER — Other Ambulatory Visit (INDEPENDENT_AMBULATORY_CARE_PROVIDER_SITE_OTHER): Payer: PPO

## 2019-06-18 ENCOUNTER — Telehealth: Payer: Self-pay | Admitting: *Deleted

## 2019-06-18 ENCOUNTER — Telehealth: Payer: Self-pay | Admitting: Medical

## 2019-06-18 DIAGNOSIS — R197 Diarrhea, unspecified: Secondary | ICD-10-CM

## 2019-06-18 LAB — CBC WITH DIFFERENTIAL/PLATELET
Basophils Absolute: 0 10*3/uL (ref 0.0–0.1)
Basophils Relative: 0.3 % (ref 0.0–3.0)
Eosinophils Absolute: 0.2 10*3/uL (ref 0.0–0.7)
Eosinophils Relative: 2.2 % (ref 0.0–5.0)
HCT: 37.3 % (ref 36.0–46.0)
Hemoglobin: 12.5 g/dL (ref 12.0–15.0)
Lymphocytes Relative: 20.7 % (ref 12.0–46.0)
Lymphs Abs: 1.7 10*3/uL (ref 0.7–4.0)
MCHC: 33.5 g/dL (ref 30.0–36.0)
MCV: 95.5 fl (ref 78.0–100.0)
Monocytes Absolute: 0.8 10*3/uL (ref 0.1–1.0)
Monocytes Relative: 9.6 % (ref 3.0–12.0)
Neutro Abs: 5.6 10*3/uL (ref 1.4–7.7)
Neutrophils Relative %: 67.2 % (ref 43.0–77.0)
Platelets: 249 10*3/uL (ref 150.0–400.0)
RBC: 3.9 Mil/uL (ref 3.87–5.11)
RDW: 13.5 % (ref 11.5–15.5)
WBC: 8.4 10*3/uL (ref 4.0–10.5)

## 2019-06-18 MED ORDER — ONDANSETRON 4 MG PO TBDP: 4.0000 mg | ORAL_TABLET | Freq: Three times a day (TID) | ORAL | 0 refills | Status: DC | PRN

## 2019-06-18 MED FILL — ONDANSETRON ODT 4 MG TABLET: 4 | 7 days supply | Qty: 20 | Fill #0

## 2019-06-18 NOTE — Telephone Encounter (Signed)
Patient had called and wanting C-diff test, so I ask lab did the test you ordered yesterday have it in there.  When Alexis Olson talked with [facility] and they no longer do the GI path panel and that we have to order separately.  Is there anything specific you are looking for?  We have to order individually.  Patient spoke will be going to [facility] lab today, so I will need a quick response please.

## 2019-06-18 NOTE — Telephone Encounter (Signed)
Thanks for fixing gastro panel order.

## 2019-06-18 NOTE — Telephone Encounter (Signed)
Patient stated that she was having a little nausea and would like to know if she could get some Zofran sent in?  I advised patient that the GI panel includes c-diff. She was not scheduled yet for her labs and she will go over to [facility] lab to labs drawn.  Labs changed over to [place].

## 2019-06-18 NOTE — Addendum Note (Signed)
Addended by: Kem Boroughs D on: 06/18/2019 09:47 AM   Modules accepted: Orders

## 2019-06-18 NOTE — Telephone Encounter (Signed)
Rx zofran sent to pt pharmacy.

## 2019-06-18 NOTE — Telephone Encounter (Signed)
Gilmore Laroche gave a [facility] test that has same test in it, GI profile.  Test ordered and other test canceled.

## 2019-06-18 NOTE — Addendum Note (Signed)
Addended by: Kem Boroughs D on: 06/18/2019 09:21 AM   Modules accepted: Orders

## 2019-06-18 NOTE — Telephone Encounter (Signed)
Nurse Assessment Nurse: Alexis Nephew, RN, [place] Date/Time Eilene Ghazi Time): 06/17/2019 6:15:55 PM Confirm and document reason for call. If symptomatic, describe symptoms. ---Caller stating she wants to know if it would be alright to bring in a stool sample to check for C-Diff. She does not want to do the stool card she wants to bring a sample to the office. Caller states she had C-Diff last year. She has been having diarrhea since Saturday morning. No blood in the stools. No antibiotic use. No fever at this time, but patient had fever over the weekend. Patient is also having nausea. Has the patient had close contact with a person known or suspected to have the novel coronavirus illness OR traveled / lives in area with major community spread (including international travel) in the last 14 days from the onset of symptoms? * If Asymptomatic, screen for exposure and travel within the last 14 days. ---No Does the patient have any new or worsening symptoms? ---Yes Will a triage be completed? ---Yes Related visit to physician within the last 2 weeks? ---No Does the PT have any chronic conditions? (i.e. diabetes, asthma, this includes High risk factors for pregnancy, etc.) ---No Is this a behavioral health or substance abuse call? ---No Guidelines Guideline Title Affirmed Question Affirmed Notes Nurse Date/Time (Eastern Time) Diarrhea [1] MODERATE diarrhea (e.g., 4-6 times / day more than normal) AND [2] Alexis Nephew RN, [place] 06/17/2019 6:19:25 PMCall Id: [id number] Guidelines Guideline Title Affirmed Question Affirmed Notes Nurse Date/Time Eilene Ghazi Time) present > 48 hours (2 days) Disp. Time Eilene Ghazi Time) Disposition Final User 06/17/2019 6:27:58 PM See PCP within 24 Hours Yes Alexis Olson, Therapist, sports, [place]

## 2019-06-19 ENCOUNTER — Telehealth: Payer: Self-pay | Admitting: Medical

## 2019-06-19 DIAGNOSIS — E876 Hypokalemia: Secondary | ICD-10-CM

## 2019-06-19 LAB — COMPREHENSIVE METABOLIC PANEL
ALT: 20 U/L (ref 0–35)
AST: 20 U/L (ref 0–37)
Albumin: 4 g/dL (ref 3.5–5.2)
Alkaline Phosphatase: 48 U/L (ref 39–117)
BUN: 9 mg/dL (ref 6–23)
CO2: 26 mEq/L (ref 19–32)
Calcium: 8.8 mg/dL (ref 8.4–10.5)
Chloride: 104 mEq/L (ref 96–112)
Creatinine, Ser: 0.6 mg/dL (ref 0.40–1.20)
GFR: 95.21 mL/min (ref >60.00)
Glucose, Bld: 100 mg/dL — ABNORMAL HIGH (ref 70–99)
Potassium: 3.2 mEq/L — ABNORMAL LOW (ref 3.5–5.1)
Sodium: 139 mEq/L (ref 135–145)
Total Bilirubin: 0.4 mg/dL (ref 0.2–1.2)
Total Protein: 6.6 g/dL (ref 6.0–8.3)

## 2019-06-19 NOTE — Telephone Encounter (Signed)
Future cmp placed. 

## 2019-06-20 ENCOUNTER — Encounter: Payer: Self-pay | Admitting: Family

## 2019-06-20 ENCOUNTER — Telehealth: Payer: Self-pay | Admitting: *Deleted

## 2019-06-20 ENCOUNTER — Telehealth: Payer: Self-pay | Admitting: Family

## 2019-06-20 ENCOUNTER — Other Ambulatory Visit: Payer: Self-pay

## 2019-06-20 ENCOUNTER — Ambulatory Visit (INDEPENDENT_AMBULATORY_CARE_PROVIDER_SITE_OTHER): Payer: PPO | Admitting: Family

## 2019-06-20 VITALS — BP 110/71 | HR 99

## 2019-06-20 DIAGNOSIS — N3000 Acute cystitis without hematuria: Secondary | ICD-10-CM | POA: Diagnosis not present

## 2019-06-20 DIAGNOSIS — E876 Hypokalemia: Secondary | ICD-10-CM

## 2019-06-20 DIAGNOSIS — A0811 Acute gastroenteropathy due to Norwalk agent: Secondary | ICD-10-CM

## 2019-06-20 LAB — GI PROFILE, STOOL, PCR

## 2019-06-20 MED ORDER — CEPHALEXIN 500 MG PO CAPS: 500.0000 mg | ORAL_CAPSULE | Freq: Three times a day (TID) | ORAL | 0 refills | Status: DC

## 2019-06-20 MED FILL — CEPHALEXIN 500 MG CAPSULE: 500 | 5 days supply | Qty: 15 | Fill #0

## 2019-06-20 NOTE — Telephone Encounter (Signed)
error 

## 2019-06-20 NOTE — Telephone Encounter (Signed)
Please contact pt to schedule a lab appointment in 2 weeks.

## 2019-06-20 NOTE — Progress Notes (Signed)
Virtual Visit via telephone Note  I connected with Alexis Olson on 06/20/19 at 11:00 AM EDT by telephone and verified that I am speaking with the correct person using two identifiers.  Location: Patient: home Provider: work   I discussed the limitations of evaluation and management by telemedicine and the availability of in person appointments. The patient expressed understanding and agreed to proceed.  History of Present Illness:  Patient is an 86 yr old female who presents today to discuss urinary tract symptoms.  She reports that she developed severe diarrhea on Saturday.  She was seen by another provider in our office on the 24th.  At that time, stool studies were ordered.  Her stool did test positive for norovirus.  She reports that she had 1 watery stools so far today, and overall her diarrhea is improving.  She reports that yesterday she began to develop urinary frequency and dysuria. No hematuria, or fever.   She has been tolerating PO's.    Observations/Objective:   Gen: Awake, alert, no acute distress Resp: Breathing is even and non-labored Psych: calm/pleasant demeanor Neuro: Alert and Oriented x 3, + facial symmetry, speech is clear.   Assessment and Plan:  UTI-symptoms most consistent with UTI.  She reports that she has tolerated cephalexin in the past.  A prescription has been sent to the [facility].  She is advised that should her symptoms worsen or fail to improve she should let us know.  If she develops severe symptoms such as high fevers, rigors weakness she should proceed to the emergency department.  Norovirus-appears to be clinically improving.  I advised the patient to let us know if her symptoms do not continue to improve or if they worsen.  She reports that she is staying well-hydrated.  Hypokalemia-I reviewed with her the recommendations that Percell Miller had for repleting her potassium.  She had not seen his note.  I will ask our staff to reach out to  schedule follow-up lab visit in 2 weeks.  15 minutes spent on today's telephone visit.   Follow Up Instructions:    I discussed the assessment and treatment plan with the patient. The patient was provided an opportunity to ask questions and all were answered. The patient agreed with the plan and demonstrated an understanding of the instructions.   The patient was advised to call back or seek an in-person evaluation if the symptoms worsen or if the condition fails to improve as anticipated.  Alexis Pear, NP

## 2019-06-20 NOTE — Telephone Encounter (Signed)
Called Alexis Olson left VM to call back for 2 week lab appt.

## 2019-06-27 MED FILL — VIT D2 1.25 MG (50,000 UNIT: 1.25 MG | 84 days supply | Qty: 12 | Fill #3

## 2019-06-27 NOTE — Telephone Encounter (Signed)
Can you please mail her a recall letter?

## 2019-07-01 NOTE — Telephone Encounter (Signed)
Talked to patient and advised her of need of appointment to repeat cmet. She prefers to go to the [facility] lab. Patient advised to go tomorrow for testing, orders are in as future.

## 2019-07-03 ENCOUNTER — Other Ambulatory Visit (INDEPENDENT_AMBULATORY_CARE_PROVIDER_SITE_OTHER): Payer: PPO

## 2019-07-03 DIAGNOSIS — E876 Hypokalemia: Secondary | ICD-10-CM | POA: Diagnosis not present

## 2019-07-03 LAB — COMPREHENSIVE METABOLIC PANEL
ALT: 16 U/L (ref 0–35)
AST: 17 U/L (ref 0–37)
Albumin: 4.1 g/dL (ref 3.5–5.2)
Alkaline Phosphatase: 47 U/L (ref 39–117)
BUN: 15 mg/dL (ref 6–23)
CO2: 26 mEq/L (ref 19–32)
Calcium: 9.1 mg/dL (ref 8.4–10.5)
Chloride: 99 mEq/L (ref 96–112)
Creatinine, Ser: 0.55 mg/dL (ref 0.40–1.20)
GFR: 105.26 mL/min (ref >60.00)
Glucose, Bld: 83 mg/dL (ref 70–99)
Potassium: 4 mEq/L (ref 3.5–5.1)
Sodium: 133 mEq/L — ABNORMAL LOW (ref 135–145)
Total Bilirubin: 0.3 mg/dL (ref 0.2–1.2)
Total Protein: 6.5 g/dL (ref 6.0–8.3)

## 2019-07-06 MED FILL — MELOXICAM 15 MG TABLET: 15 | 90 days supply | Qty: 90 | Fill #1

## 2019-07-07 MED FILL — MONTELUKAST SOD 10 MG TAB: 10 | 90 days supply | Qty: 90 | Fill #1

## 2019-07-09 MED FILL — ZOLPIDEM TARTRATE 5 MG TAB: 5 | 90 days supply | Qty: 90 | Fill #1

## 2019-07-10 ENCOUNTER — Telehealth: Payer: Self-pay

## 2019-07-10 NOTE — Telephone Encounter (Signed)
Tired has broad differetial dx including uti for which she was treated for uti but looks like no culture was done. So would ask her to make office appointment/be see in persons. Would get ua, do exam and labs.   Can't figure out reason with certainty just virtual.

## 2019-07-10 NOTE — Telephone Encounter (Signed)
Discussed lab work with patient and she states she is still tired of now .

## 2019-07-10 NOTE — Telephone Encounter (Signed)
Patient called in to see if the nurse or Dr. Charlett Blake can give her a call to discuss her test results. Please call the patient at [phone]

## 2019-07-17 DIAGNOSIS — M1711 Unilateral primary osteoarthritis, right knee: Secondary | ICD-10-CM | POA: Diagnosis not present

## 2019-07-17 DIAGNOSIS — M170 Bilateral primary osteoarthritis of knee: Secondary | ICD-10-CM | POA: Diagnosis not present

## 2019-07-17 DIAGNOSIS — M1712 Unilateral primary osteoarthritis, left knee: Secondary | ICD-10-CM | POA: Diagnosis not present

## 2019-07-23 ENCOUNTER — Telehealth: Payer: Self-pay | Admitting: Family Medicine

## 2019-07-23 NOTE — Telephone Encounter (Signed)
Patient states that the labs she had done on 03/24 for a stool sample and electrolytes was denied by insurance because it wasn't pre approved. Please advise She said the bill was a little over  $700

## 2019-07-23 NOTE — Telephone Encounter (Signed)
Pt is calling to see if she needs a medical Clearance for her Knee Surgery on June 1st. Please Advise

## 2019-07-24 NOTE — Telephone Encounter (Signed)
Medical clearance form received and given to provider.    Left detailed message on machine that medical clearance is needed and we are waiting on provider to fill out.

## 2019-07-24 NOTE — Telephone Encounter (Signed)
Spoke with patient after speaking with [facility] at [facility].  [facility], patient has not been billed for anything for DOS 06/18/19.  Mrs. Thom stated she has gotten an EOB.  I explained to her that is only an Explanation of Benefits and not anything she has to pay.  Patient appreciated the call back and the explanation.

## 2019-07-28 ENCOUNTER — Telehealth: Payer: Self-pay | Admitting: Family Medicine

## 2019-07-28 NOTE — Telephone Encounter (Signed)
Left message on machine that form was already faxed this morning.

## 2019-07-28 NOTE — Telephone Encounter (Signed)
Left detailed message for patient that I faxed medical clearance for over to [facility].

## 2019-07-28 NOTE — Telephone Encounter (Signed)
Caller : Shayda Curtsinger Call Back # [phone]  Subject : Medical Clearance    Patient states that she needs a medical clearance form filled out and sent to DR. Paralee Cancel with [facility], [place], [place], [place] [address]  CONTACT Phone: [phone]  Phone: [phone]  Fax: [phone]

## 2019-08-02 ENCOUNTER — Other Ambulatory Visit: Payer: Self-pay | Admitting: Family Medicine

## 2019-08-04 ENCOUNTER — Other Ambulatory Visit: Payer: Self-pay | Admitting: Internal Medicine

## 2019-08-14 ENCOUNTER — Encounter (HOSPITAL_COMMUNITY): Payer: Self-pay

## 2019-08-14 NOTE — Patient Instructions (Addendum)
DUE TO COVID-19 ONLY ONE VISITOR ARE ALLOWED TO COME WITH YOU AND STAY IN THE WAITING ROOM ONLY DURING PRE OP AND PROCEDURE. THEN TWO VISITORS MAY VISIT WITH YOU IN YOUR PRIVATE ROOM DURING VISITING HOURS ONLY!!   COVID SWAB TESTING MUST BE COMPLETED ON:  Friday, Aug 22, 2019 at 2:25 PM    [address] [place] [facility] enter pre surgical testing line (Must self quarantine after testing. Follow instructions on handout.)             Your procedure is scheduled on: Tuesday, August 26, 2019   Report to [facility] Main  Entrance    Report to admitting at 5:30 AM   Call this number if you have problems the morning of surgery [phone]   Do not eat food:After Midnight.   May have liquids until 4:15  AM day of surgery   CLEAR LIQUID DIET  Foods Allowed                                                                     Foods Excluded  Water, Black Coffee and tea, regular and decaf                             liquids that you cannot  Plain Jell-O in any flavor  (No red)                                           see through such as: Fruit ices (not with fruit pulp)                                     milk, soups, orange juice  Iced Popsicles (No red)                                    All solid food Carbonated beverages, regular and diet                                    Apple juices Sports drinks like Gatorade (No red) Lightly seasoned clear broth or consume(fat free) Sugar, honey syrup  Sample Menu Breakfast                                Lunch                                     Supper Cranberry juice                    Beef broth                            Chicken  broth Jell-O                                     Grape juice                           Apple juice Coffee or tea                        Jell-O                                      Popsicle                                                Coffee or tea                        Coffee or  tea   Complete one Ensure drink the morning of surgery at  4:15 AM the day of surgery.   Oral Hygiene is also important to reduce your risk of infection.                                    Remember - BRUSH YOUR TEETH THE MORNING OF SURGERY WITH YOUR REGULAR TOOTHPASTE   Do NOT smoke after Midnight   Take these medicines the morning of surgery with A SIP OF WATER: Zrytec, Omeprazole   Use Symbicort and Flonase the day of surgery   May use eye drops if needed day of surgery   Bring rescue inhaler day of surgery                               You may not have any metal on your body including hair pins, jewelry, and body piercings             Do not wear make-up, lotions, powders, perfumes/cologne, or deodorant             Do not wear nail polish.  Do not shave  48 hours prior to surgery.                Do not bring valuables to the hospital. [place].   Contacts, dentures or bridgework may not be worn into surgery.   Bring small overnight bag day of surgery.    Patients discharged the day of surgery will not be allowed to drive home.   Special Instructions: Bring a copy of your healthcare power of attorney and living will documents         the day of surgery if you haven't scanned them in before.              Please read over the following fact sheets you were given: IF [place] [phone]   [place] - Preparing for Surgery Before surgery, you can play an  important role.  Because skin is not sterile, your skin needs to be as free of germs as possible.  You can reduce the number of germs on your skin by washing with CHG (chlorahexidine gluconate) soap before surgery.  CHG is an antiseptic cleaner which kills germs and bonds with the skin to continue killing germs even after washing. Please DO NOT use if you have an allergy to CHG or antibacterial soaps.  If your skin becomes  reddened/irritated stop using the CHG and inform your nurse when you arrive at Short Stay. Do not shave (including legs and underarms) for at least 48 hours prior to the first CHG shower.  You may shave your face/neck.  Please follow these instructions carefully:  1.  Shower with CHG Soap the night before surgery and the  morning of surgery.  2.  If you choose to wash your hair, wash your hair first as usual with your normal  shampoo.  3.  After you shampoo, rinse your hair and body thoroughly to remove the shampoo.                             4.  Use CHG as you would any other liquid soap.  You can apply chg directly to the skin and wash.  Gently with a scrungie or clean washcloth.  5.  Apply the CHG Soap to your body ONLY FROM THE NECK DOWN.   Do   not use on face/ open                           Wound or open sores. Avoid contact with eyes, ears mouth and   genitals (private parts).                       Wash face,  Genitals (private parts) with your normal soap.             6.  Wash thoroughly, paying special attention to the area where your    surgery  will be performed.  7.  Thoroughly rinse your body with warm water from the neck down.  8.  DO NOT shower/wash with your normal soap after using and rinsing off the CHG Soap.                9.  Pat yourself dry with a clean towel.            10.  Wear clean pajamas.            11.  Place clean sheets on your bed the night of your first shower and do not  sleep with pets. Day of Surgery : Do not apply any lotions/deodorants the morning of surgery.  Please wear clean clothes to the hospital/surgery center.  FAILURE TO FOLLOW THESE INSTRUCTIONS MAY RESULT IN THE CANCELLATION OF YOUR SURGERY  PATIENT SIGNATURE_________________________________  NURSE SIGNATURE__________________________________  ________________________________________________________________________   Alexis Olson  An incentive spirometer is a tool that can help  keep your lungs clear and active. This tool measures how well you are filling your lungs with each breath. Taking long deep breaths may help reverse or decrease the chance of developing breathing (pulmonary) problems (especially infection) following:  A long period of time when you are unable to move or be active. BEFORE THE PROCEDURE   If the spirometer includes an indicator to show your best effort,  your nurse or respiratory therapist will set it to a desired goal.  If possible, sit up straight or lean slightly forward. Try not to slouch.  Hold the incentive spirometer in an upright position. INSTRUCTIONS FOR USE  1. Sit on the edge of your bed if possible, or sit up as far as you can in bed or on a chair. 2. Hold the incentive spirometer in an upright position. 3. Breathe out normally. 4. Place the mouthpiece in your mouth and seal your lips tightly around it. 5. Breathe in slowly and as deeply as possible, raising the piston or the ball toward the top of the column. 6. Hold your breath for 3-5 seconds or for as long as possible. Allow the piston or ball to fall to the bottom of the column. 7. Remove the mouthpiece from your mouth and breathe out normally. 8. Rest for a few seconds and repeat Steps 1 through 7 at least 10 times every 1-2 hours when you are awake. Take your time and take a few normal breaths between deep breaths. 9. The spirometer may include an indicator to show your best effort. Use the indicator as a goal to work toward during each repetition. 10. After each set of 10 deep breaths, practice coughing to be sure your lungs are clear. If you have an incision (the cut made at the time of surgery), support your incision when coughing by placing a pillow or rolled up towels firmly against it. Once you are able to get out of bed, walk around indoors and cough well. You may stop using the incentive spirometer when instructed by your caregiver.  RISKS AND COMPLICATIONS  Take your  time so you do not get dizzy or light-headed.  If you are in pain, you may need to take or ask for pain medication before doing incentive spirometry. It is harder to take a deep breath if you are having pain. AFTER USE  Rest and breathe slowly and easily.  It can be helpful to keep track of a log of your progress. Your caregiver can provide you with a simple table to help with this. If you are using the spirometer at home, follow these instructions: [place] IF:   You are having difficultly using the spirometer.  You have trouble using the spirometer as often as instructed.  Your pain medication is not giving enough relief while using the spirometer.  You develop fever of 100.5 F (38.1 C) or higher. SEEK IMMEDIATE MEDICAL CARE IF:   You cough up bloody sputum that had not been present before.  You develop fever of 102 F (38.9 C) or greater.  You develop worsening pain at or near the incision site. MAKE SURE YOU:   Understand these instructions.  Will watch your condition.  Will get help right away if you are not doing well or get worse. Document Released: 07/24/2006 Document Revised: 06/05/2011 Document Reviewed: 09/24/2006 Exit[employer] Patient Information 2014 [employer], [place].   ________________________________________________________________________  WHAT IS A BLOOD TRANSFUSION? Blood Transfusion Information  A transfusion is the replacement of blood or some of its parts. Blood is made up of multiple cells which provide different functions.  Red blood cells carry oxygen and are used for blood loss replacement.  White blood cells fight against infection.  Platelets control bleeding.  Plasma helps clot blood.  Other blood products are available for specialized needs, such as hemophilia or other clotting disorders. BEFORE THE TRANSFUSION  Who gives blood for transfusions?  Healthy volunteers who are fully evaluated to make sure their blood is safe. This  is blood bank blood. Transfusion therapy is the safest it has ever been in the practice of medicine. Before blood is taken from a donor, a complete history is taken to make sure that person has no history of diseases nor engages in risky social behavior (examples are intravenous drug use or sexual activity with multiple partners). The donor's travel history is screened to minimize risk of transmitting infections, such as malaria. The donated blood is tested for signs of infectious diseases, such as HIV and hepatitis. The blood is then tested to be sure it is compatible with you in order to minimize the chance of a transfusion reaction. If you or a relative donates blood, this is often done in anticipation of surgery and is not appropriate for emergency situations. It takes many days to process the donated blood. RISKS AND COMPLICATIONS Although transfusion therapy is very safe and saves many lives, the main dangers of transfusion include:   Getting an infectious disease.  Developing a transfusion reaction. This is an allergic reaction to something in the blood you were given. Every precaution is taken to prevent this. The decision to have a blood transfusion has been considered carefully by your caregiver before blood is given. Blood is not given unless the benefits outweigh the risks. AFTER THE TRANSFUSION  Right after receiving a blood transfusion, you will usually feel much better and more energetic. This is especially true if your red blood cells have gotten low (anemic). The transfusion raises the level of the red blood cells which carry oxygen, and this usually causes an energy increase.  The nurse administering the transfusion will monitor you carefully for complications. HOME CARE INSTRUCTIONS  No special instructions are needed after a transfusion. You may find your energy is better. Speak with your caregiver about any limitations on activity for underlying diseases you may have. SEEK  MEDICAL CARE IF:   Your condition is not improving after your transfusion.  You develop redness or irritation at the intravenous (IV) site. SEEK IMMEDIATE MEDICAL CARE IF:  Any of the following symptoms occur over the next 12 hours:  Shaking chills.  You have a temperature by mouth above 102 F (38.9 C), not controlled by medicine.  Chest, back, or muscle pain.  People around you feel you are not acting correctly or are confused.  Shortness of breath or difficulty breathing.  Dizziness and fainting.  You get a rash or develop hives.  You have a decrease in urine output.  Your urine turns a dark color or changes to pink, red, or brown. Any of the following symptoms occur over the next 10 days:  You have a temperature by mouth above 102 F (38.9 C), not controlled by medicine.  Shortness of breath.  Weakness after normal activity.  The white part of the eye turns yellow (jaundice).  You have a decrease in the amount of urine or are urinating less often.  Your urine turns a dark color or changes to pink, red, or brown. Document Released: 03/10/2000 Document Revised: 06/05/2011 Document Reviewed: 10/28/2007 [facility] Patient Information 2014 [place], [place].  _______________________________________________________________________

## 2019-08-15 ENCOUNTER — Encounter (HOSPITAL_COMMUNITY): Payer: PPO

## 2019-08-15 MED FILL — ALPRAZolam 1 MG TABS: 1 | 90 days supply | Qty: 90 | Fill #1

## 2019-08-18 ENCOUNTER — Encounter (HOSPITAL_COMMUNITY)
Admission: RE | Admit: 2019-08-18 | Discharge: 2019-08-18 | Disposition: A | Discharge status: Home / Self Care | Payer: PPO | Source: Ambulatory Visit | Attending: Orthopedic Surgery | Admitting: Orthopedic Surgery

## 2019-08-18 ENCOUNTER — Encounter (HOSPITAL_COMMUNITY): Payer: Self-pay

## 2019-08-18 ENCOUNTER — Other Ambulatory Visit: Payer: Self-pay

## 2019-08-18 DIAGNOSIS — Z01818 Encounter for other preprocedural examination: Secondary | ICD-10-CM | POA: Diagnosis not present

## 2019-08-18 HISTORY — DX: Personal history of other diseases of the circulatory system: Z86.79

## 2019-08-18 HISTORY — DX: Unspecified osteoarthritis, unspecified site: M19.90

## 2019-08-18 HISTORY — DX: Personal history of other medical treatment: Z92.89

## 2019-08-18 HISTORY — DX: Other specified health status: Z78.9

## 2019-08-18 HISTORY — DX: Personal history of other diseases of the nervous system and sense organs: Z86.69

## 2019-08-18 HISTORY — DX: Hyperlipidemia, unspecified: E78.5

## 2019-08-18 HISTORY — DX: Pneumonia, unspecified organism: J18.9

## 2019-08-18 HISTORY — DX: Osteomyelitis, unspecified: M86.9

## 2019-08-18 LAB — CBC
HCT: 34.9 % — ABNORMAL LOW (ref 36.0–46.0)
Hemoglobin: 11.3 g/dL — ABNORMAL LOW (ref 12.0–15.0)
MCH: 31.8 pg (ref 26.0–34.0)
MCHC: 32.4 g/dL (ref 30.0–36.0)
MCV: 98.3 fL (ref 80.0–100.0)
Platelets: 259 10*3/uL (ref 150–400)
RBC: 3.55 MIL/uL — ABNORMAL LOW (ref 3.87–5.11)
RDW: 13.4 % (ref 11.5–15.5)
WBC: 7.2 10*3/uL (ref 4.0–10.5)
nRBC: 0 % (ref 0.0–0.2)

## 2019-08-18 LAB — BASIC METABOLIC PANEL
Anion gap: 8 (ref 5–15)
BUN: 15 mg/dL (ref 8–23)
CO2: 26 mmol/L (ref 22–32)
Calcium: 8.7 mg/dL — ABNORMAL LOW (ref 8.9–10.3)
Chloride: 96 mmol/L — ABNORMAL LOW (ref 98–111)
Creatinine, Ser: 0.46 mg/dL (ref 0.44–1.00)
GFR calc Af Amer: >60 mL/min (ref >60)
GFR calc non Af Amer: >60 mL/min (ref >60)
Glucose, Bld: 88 mg/dL (ref 70–99)
Potassium: 4.2 mmol/L (ref 3.5–5.1)
Sodium: 130 mmol/L — ABNORMAL LOW (ref 135–145)

## 2019-08-18 LAB — SURGICAL PCR SCREEN
MRSA, PCR: NEGATIVE
Staphylococcus aureus: NEGATIVE

## 2019-08-18 NOTE — Progress Notes (Signed)
Has completed COVID 19 vaccine series  PCP - Dr. Frederik Pear  Cardiologist - N/A  Chest x-ray - N/A EKG - N/A Stress Test - greater than 2 years ECHO - N/A Cardiac Cath - N/A  Sleep Study - N/A CPAP - N/A  Fasting Blood Sugar - N/A Checks Blood Sugar _N/A____ times a day  Blood Thinner Instructions: N/A Aspirin Instructions: Instructed to call Dr. Aurea Graff office for instructions Last Dose:  Anesthesia review: N/A  Patient denies shortness of breath, fever, cough and chest pain at PAT appointment   Patient verbalized understanding of instructions that were given to them at the PAT appointment. Patient was also instructed that they will need to review over the PAT instructions again at home before surgery.

## 2019-08-19 LAB — ABO/RH: ABO/RH(D): A NEG

## 2019-08-22 ENCOUNTER — Other Ambulatory Visit (HOSPITAL_COMMUNITY)
Admission: RE | Admit: 2019-08-22 | Discharge: 2019-08-22 | Disposition: A | Discharge status: Home / Self Care | Payer: PPO | Source: Ambulatory Visit | Attending: Orthopedic Surgery | Admitting: Orthopedic Surgery

## 2019-08-22 DIAGNOSIS — Z20822 Contact with and (suspected) exposure to covid-19: Secondary | ICD-10-CM | POA: Insufficient documentation

## 2019-08-22 DIAGNOSIS — Z01812 Encounter for preprocedural laboratory examination: Secondary | ICD-10-CM | POA: Insufficient documentation

## 2019-08-22 LAB — SARS CORONAVIRUS 2 (TAT 6-24 HRS): SARS Coronavirus 2: NEGATIVE

## 2019-08-22 NOTE — Progress Notes (Signed)
Pt called to reclarify surgery time for procedure scheduled for Tuesday August 26, 2019.  Pt aware to arrive at [facility] admitting at 5:30 am. Pt aware of no food after midnight, clear liquids from midnight till 0415 consuming ERAS presurgery drink at 0415 then nothing by mouth.

## 2019-08-22 NOTE — H&P (Signed)
TOTAL KNEE ADMISSION H&P  Patient is being admitted for right total knee arthroplasty and left knee cortisone injection.  Subjective:  Chief Complaint:Bilateral knee OA / pain  HPI: Alexis Olson, 86 y.o. female, has a history of pain and functional disability in the bilateral knee due to arthritis and has failed non-surgical conservative treatments for greater than 12 weeks to include NSAID's and/or analgesics, corticosteriod injections, viscosupplementation injections, use of assistive devices and activity modification.  Onset of symptoms was gradual, starting 2 years ago with gradually worsening course since that time. The patient noted no past surgery on the right knee(s).  Patient currently rates pain in the right knee(s) at 7 out of 10 with activity. Patient has worsening of pain with activity and weight bearing, pain that interferes with activities of daily living, pain with passive range of motion, crepitus and joint swelling.  Patient has evidence of periarticular osteophytes and joint space narrowing by imaging studies.  There is no active infection.  Risks, benefits and expectations were discussed with the patient.  Risks including but not limited to the risk of anesthesia, blood clots, nerve damage, blood vessel damage, failure of the prosthesis, infection and up to and including death.  Patient understand the risks, benefits and expectations and wishes to proceed with surgery.   PCP: Mosie Lukes, MD  D/C Plans:       Home  Post-op Meds:       No Rx given   Tranexamic Acid:      To be given - IV   Decadron:      Is to be given  FYI:     Xarelto then ASA (previous DVT)  Oxycodone (used previously)  DME:   Pt already has equipment  PT:   [place]: [facility]    Patient Active Problem List   Diagnosis Date Noted  . Ventral hernia without obstruction or gangrene 05/22/2018  . Greenfield filter in place 03/08/2017  . Pain of right lower extremity  03/08/2017  . Preventative health care 02/13/2017  . Hyponatremia 05/02/2016  . GERD (gastroesophageal reflux disease) 08/23/2015  . HSV (herpes simplex virus) infection 08/16/2015  . Hyperlipidemia, mild 08/16/2015  . Osteoporosis   . Chronic diarrhea   . Anemia   . OSTEOMYELITIS, VERTEBRA 05/05/2009  . [place] DISEASE, LUMBOSACRAL SPINE 04/22/2009  . Vitamin B 12 deficiency 07/09/2008  . DIVERTICULOSIS, COLON 07/09/2008  . COLONIC POLYPS, ADENOMATOUS, HX OF 07/09/2008  . Anxiety state 08/08/2007  . INSOMNIA 08/08/2007  . Bariatric surgery status 08/08/2007  . Midline back pain 10/30/2006  . ALLERGIC RHINITIS 09/18/2006   Past Medical History:  Diagnosis Date  . ALLERGIC RHINITIS   . ANEMIA, CHRONIC    Malabsorption related to bypass hx, B12 and iron deficiency  . ANXIETY   . Arthritis   . ASTHMA   . Bariatric surgery status   . Bowel perforation ([place]) 2016  . C. difficile diarrhea   . Colon perforation ([place]) 08/2014   following polypetomy during colo, surgical repair  . COLONIC POLYPS, ADENOMATOUS, HX OF 2010  . DIVERTICULOSIS, COLON   . DVT (deep venous thrombosis) ([place]) 2018  . DVT (deep venous thrombosis) ([place])   . GERD (gastroesophageal reflux disease) 08/23/2015  . Graded compression stocking in place   . History of blood transfusion   . History of migraine   . History of rheumatic fever   . HSV (herpes simplex virus) infection 08/16/2015  . Hyperlipidemia   .  Hypertension    Denies  . Hyponatremia 05/02/2016  . Incomplete left bundle branch block (LBBB) 2016   Noted on EKG  . INSOMNIA   . Lumbosacral disc disease    Chronic pain; History of osteomyelitis 2010 following ESI complication  . Obesity   . Osteomyelitis ([place])   . Pelvic fracture ([place])   . Pneumonia   . Recurrent UTI 08/23/2015  . URINARY URGENCY   . Ventral hernia without obstruction or gangrene 05/22/2018  . VITAMIN B12 DEFICIENCY     Past Surgical History:  Procedure Laterality Date  . [place]   for ruptured disc  . CHOLECYSTECTOMY    . COLONOSCOPY    . greenfield filter    . ileojejunal bypass  1976   for obesity  . LAPAROTOMY N/A 09/18/2014   Procedure: EXPLORATORY LAPAROTOMY WITH REPAIR OF CECIAL PERFORATION;  Surgeon: Autumn Messing III, MD;  Location: [facility];  Service: General;  Laterality: N/A;  . TONSILLECTOMY    . UPPER GI ENDOSCOPY    . VASCULAR SURGERY Right    revascularization right lower extremity    No current facility-administered medications for this encounter.   Current Outpatient Medications  Medication Sig Dispense Refill Last Dose  . acetaminophen-codeine (TYLENOL #3) 300-30 MG tablet Take 1 tablet by mouth every 8 (eight) hours as needed for moderate pain or severe pain. 30 tablet 0   . albuterol (PROVENTIL HFA;VENTOLIN HFA) 108 (90 BASE) MCG/ACT inhaler Inhale 1-2 puffs into the lungs every 6 (six) hours as needed for wheezing or shortness of breath.     Marland Kitchen alendronate (FOSAMAX) 70 MG tablet Take 1 tablet (70 mg total) by mouth every 7 (seven) days. Take with a full glass of water on an empty stomach. 4 tablet 5   . ALPRAZolam (XANAX) 1 MG tablet TAKE 1/2 TO 1 TABLET BY MOUTH AT BEDTIME AS NEEDED (Patient taking differently: Take 0.5 mg by mouth at bedtime as needed for anxiety or sleep. ) 90 tablet 1   . amitriptyline (ELAVIL) 100 MG tablet TAKE 1 TABLET BY MOUTH DAILY. (Patient taking differently: Take 100 mg by mouth at bedtime. ) 90 tablet 1   . azelastine (OPTIVAR) 0.05 % ophthalmic solution Place 1 drop into both eyes daily as needed (for dry eyes).      . benzonatate (TESSALON) 100 MG capsule Take 1 capsule (100 mg total) by mouth 3 (three) times daily as needed for cough. 30 capsule 0   . budesonide-formoterol (SYMBICORT) 160-4.5 MCG/ACT inhaler Inhale 2 puffs into the lungs See admin instructions. Inhale 2 puffs into the lungs one to two times a day (Patient taking differently: Inhale 2 puffs into the lungs in the morning and at bedtime. Inhale  2 puffs into the lungs one to two times a day) 1 Inhaler 2   . cetirizine (ZYRTEC) 10 MG tablet Take 10 mg by mouth daily.      . cyanocobalamin (,VITAMIN B-12,) 1000 MCG/ML injection Inject 1cc into the muscle every 3 months 6 mL 1   . diclofenac sodium (VOLTAREN) 1 % GEL Apply 2-4 g topically See admin instructions. Apply 2-4 grams to both knees once a day  0   . dicyclomine (BENTYL) 10 MG capsule Take 1 capsule (10 mg total) by mouth 3 (three) times daily as needed (for diarrhea & symptoms). 270 capsule 3   . diphenoxylate-atropine (LOMOTIL) 2.5-0.025 MG tablet Take 2 tablets by mouth 3 (three) times daily as needed for diarrhea or  loose stools. 150 tablet 0   . Fe Fum-FA-B Cmp-C-Zn-Mg-Mn-Cu (HEMOCYTE PLUS) 106-1 MG CAPS TAKE 1 CAPSULE BY MOUTH 2 TIMES DAILY. (Patient taking differently: Take 1 capsule by mouth in the morning and at bedtime. ) 60 capsule 1   . fluticasone (FLONASE) 50 MCG/ACT nasal spray 2 sprays by Nasal route daily.       Marland Kitchen gabapentin (NEURONTIN) 300 MG capsule TAKE 1 CAPSULE BY MOUTH AT BEDTIME (Patient taking differently: Take 300 mg by mouth at bedtime. ) 90 capsule 1   . hydrOXYzine (ATARAX) 25 MG tablet Take 25 mg by mouth 3 (three) times daily as needed for itching.      . levocetirizine (XYZAL) 5 MG tablet Take 1 tablet (5 mg total) by mouth every evening. 90 tablet 3   . lubiprostone (AMITIZA) 24 MCG capsule Take 24 mcg by mouth daily as needed for constipation.     . magnesium oxide (MAG-OX 400) 400 MG tablet Take 400 mg by mouth 2 (two) times daily.      . meloxicam (MOBIC) 15 MG tablet Take 15 mg by mouth daily.  1   . methocarbamol (ROBAXIN) 500 MG tablet Take 1 tablet (500 mg total) by mouth every 8 (eight) hours as needed for muscle spasms. 90 tablet 1   . montelukast (SINGULAIR) 10 MG tablet Take 10 mg by mouth at bedtime.      Marland Kitchen omeprazole (PRILOSEC) 20 MG capsule TAKE 1 CAPSULE BY MOUTH DAILY. (Patient taking differently: Take 20 mg by mouth daily. ) 90 capsule  3   . ondansetron (ZOFRAN-ODT) 4 MG disintegrating tablet Take 1 tablet (4 mg total) by mouth 3 (three) times daily as needed for nausea or vomiting. 20 tablet 0   . phenazopyridine (PYRIDIUM) 200 MG tablet Take 200 mg by mouth 3 (three) times daily as needed for pain.      . polyethylene glycol powder (MIRALAX) powder Take 17 g by mouth daily. (Patient taking differently: Take 17 g by mouth daily as needed for mild constipation. ) 255 g 0   . potassium chloride SA (KLOR-CON) 20 MEQ tablet TAKE 1 TABLET BY MOUTH ONCE DAILY (Patient taking differently: Take 20 mEq by mouth daily. ) 90 tablet 1   . Probiotic Product (PROBIOTIC DAILY PO) Take 1 capsule by mouth daily.     Marland Kitchen PX ENTERIC ASPIRIN 81 MG EC tablet Take 81 mg by mouth daily.      . valACYclovir (VALTREX) 500 MG tablet TAKE 1 TABLET (500 MG TOTAL) BY MOUTH AS NEEDED. 30 tablet 5   . Vitamin D, Ergocalciferol, (DRISDOL) 1.25 MG (50000 UT) CAPS capsule Take 1 capsule (50,000 Units total) by mouth every 7 (seven) days. 12 capsule 4   . zolpidem (AMBIEN) 5 MG tablet TAKE 1 TABLET BY MOUTH AT BEDTIME. (Patient taking differently: Take 5 mg by mouth at bedtime. ) 90 tablet 1   . cephALEXin (KEFLEX) 500 MG capsule Take 1 capsule (500 mg total) by mouth 3 (three) times daily. (Patient not taking: Reported on 08/06/2019) 15 capsule 0 Not Taking at Unknown time  . vitamin E 400 UNIT capsule Take 400 Units by mouth daily.        Allergies  Allergen Reactions  . Ativan [Lorazepam] Anaphylaxis and Other (See Comments)    Deathly allergic; "resp rate dropped dropped to 6"  . Other Anaphylaxis    Red peppers--choking, also  . Azithromycin Diarrhea  . Benadryl [Diphenhydramine Hcl] Other (See Comments)    Paradoxical  response  . Ciprofloxacin Hcl Other (See Comments)    Causes yeast infection and refuses to take  . Codeine Phosphate Nausea Only    Can tolerate in limited amounts  . Hydrocodone Itching and Nausea And Vomiting  .  Hydrocodone-Homatropine Itching and Nausea And Vomiting  . Levaquin [Levofloxacin In D5w] Other (See Comments)    Muscle soreness  . Levofloxacin   . Meperidine Hcl Other (See Comments)    Reaction ??  . Molds & Smuts Other (See Comments)    unsure  . Sulfa Antibiotics Other (See Comments)    Joint pain  . Tape Other (See Comments)    No Coban wrap (per the patient)  . Ultram [Tramadol Hcl] Other (See Comments)    "Can't move joints"    Social History   Tobacco Use  . Smoking status: Never Smoker  . Smokeless tobacco: Never Used  Substance Use Topics  . Alcohol use: No    Alcohol/week: 0.0 standard drinks    Family History  Problem Relation Age of Onset  . Cervical cancer Mother   . Stroke Mother   . Liver disease Sister   . Kidney disease Sister   . Cirrhosis Sister   . Colon cancer Neg Hx   . Esophageal cancer Neg Hx   . Rectal cancer Neg Hx   . Stomach cancer Neg Hx      Review of Systems  Constitutional: Negative.   HENT: Negative.   Eyes: Negative.   Respiratory: Negative.   Cardiovascular: Negative.   Gastrointestinal: Negative.   Genitourinary: Negative.   Musculoskeletal: Positive for joint pain.  Skin: Negative.   Neurological: Negative.   Endo/Heme/Allergies: Positive for environmental allergies.  Psychiatric/Behavioral: The patient is nervous/anxious.       Objective:  Physical Exam  Constitutional: She is oriented to person, place, and time. She appears well-developed.  HENT:  Head: Normocephalic.  Eyes: Pupils are equal, round, and reactive to light.  Neck: No JVD present. No tracheal deviation present. No thyromegaly present.  Cardiovascular: Normal rate, regular rhythm and intact distal pulses.  Respiratory: Effort normal and breath sounds normal. No respiratory distress. She has no wheezes.  GI: Soft. There is no abdominal tenderness. There is no guarding.  Musculoskeletal:     Cervical back: Neck supple.     Right knee: Swelling and  bony tenderness present. No deformity, erythema, ecchymosis or lacerations. Tenderness present.     Left knee: Swelling and bony tenderness present. No deformity, erythema, ecchymosis or lacerations. Tenderness present.  Lymphadenopathy:    She has no cervical adenopathy.  Neurological: She is alert and oriented to person, place, and time.  Skin: Skin is warm and dry.  Psychiatric: She has a normal mood and affect.      Labs:  Estimated body mass index is 30.81 kg/m as calculated from the following:   Height as of 08/18/19: 5' 5.5" (1.664 m).   Weight as of 08/18/19: 85.3 kg.   Imaging Review Plain radiographs demonstrate severe degenerative joint disease of the right knee. The bone quality appears to be good for age and reported activity level.      Assessment/Plan:  End stage arthritis, right knee   The patient history, physical examination, clinical judgment of the provider and imaging studies are consistent with end stage degenerative joint disease of the right knee(s) and total knee arthroplasty is deemed medically necessary. The treatment options including medical management, injection therapy arthroscopy and arthroplasty were discussed at length. The risks  and benefits of total knee arthroplasty were presented and reviewed. The risks due to aseptic loosening, infection, stiffness, patella tracking problems, thromboembolic complications and other imponderables were discussed. The patient acknowledged the explanation, agreed to proceed with the plan and consent was signed. Patient is being admitted for inpatient treatment for surgery, pain control, PT, OT, prophylactic antibiotics, VTE prophylaxis, progressive ambulation and ADL's and discharge planning. The patient is planning to be discharged home.     Patient's anticipated LOS is less than 2 midnights, meeting these requirements: - Lives within 1 hour of care - Has a competent adult at home to recover with post-op  recover - NO history of  - Chronic pain requiring opiods  - Diabetes  - Coronary Artery Disease  - Heart failure  - Heart attack  - Stroke  - Cardiac arrhythmia  - Respiratory Failure/COPD  - Renal failure  - Anemia  - Advanced Liver disease

## 2019-08-26 ENCOUNTER — Encounter (HOSPITAL_COMMUNITY)
Admission: RE | Disposition: A | Discharge status: Home / Self Care | Payer: Self-pay | Source: Home / Self Care | Attending: Orthopedic Surgery

## 2019-08-26 ENCOUNTER — Other Ambulatory Visit: Payer: Self-pay

## 2019-08-26 ENCOUNTER — Encounter (HOSPITAL_COMMUNITY): Payer: Self-pay | Admitting: Orthopedic Surgery

## 2019-08-26 ENCOUNTER — Inpatient Hospital Stay (HOSPITAL_COMMUNITY)
Admission: RE | Admit: 2019-08-26 | Discharge: 2019-08-29 | DRG: 470 | Disposition: A | Discharge status: Home / Self Care | Payer: PPO | Attending: Orthopedic Surgery | Admitting: Orthopedic Surgery

## 2019-08-26 ENCOUNTER — Ambulatory Visit (HOSPITAL_COMMUNITY): Payer: PPO | Admitting: Certified Registered Nurse Anesthetist

## 2019-08-26 ENCOUNTER — Telehealth (HOSPITAL_COMMUNITY): Payer: Self-pay | Admitting: *Deleted

## 2019-08-26 DIAGNOSIS — K219 Gastro-esophageal reflux disease without esophagitis: Secondary | ICD-10-CM | POA: Diagnosis present

## 2019-08-26 DIAGNOSIS — Z9884 Bariatric surgery status: Secondary | ICD-10-CM

## 2019-08-26 DIAGNOSIS — G4700 Insomnia, unspecified: Secondary | ICD-10-CM | POA: Diagnosis not present

## 2019-08-26 DIAGNOSIS — M1711 Unilateral primary osteoarthritis, right knee: Secondary | ICD-10-CM | POA: Diagnosis not present

## 2019-08-26 DIAGNOSIS — M170 Bilateral primary osteoarthritis of knee: Principal | ICD-10-CM | POA: Diagnosis present

## 2019-08-26 DIAGNOSIS — E785 Hyperlipidemia, unspecified: Secondary | ICD-10-CM | POA: Diagnosis present

## 2019-08-26 DIAGNOSIS — I10 Essential (primary) hypertension: Secondary | ICD-10-CM | POA: Diagnosis present

## 2019-08-26 DIAGNOSIS — M1712 Unilateral primary osteoarthritis, left knee: Secondary | ICD-10-CM | POA: Diagnosis not present

## 2019-08-26 DIAGNOSIS — Z86718 Personal history of other venous thrombosis and embolism: Secondary | ICD-10-CM | POA: Diagnosis not present

## 2019-08-26 DIAGNOSIS — Z6830 Body mass index (BMI) 30.0-30.9, adult: Secondary | ICD-10-CM

## 2019-08-26 DIAGNOSIS — E669 Obesity, unspecified: Secondary | ICD-10-CM | POA: Diagnosis present

## 2019-08-26 DIAGNOSIS — Z20822 Contact with and (suspected) exposure to covid-19: Secondary | ICD-10-CM | POA: Diagnosis present

## 2019-08-26 DIAGNOSIS — G8929 Other chronic pain: Secondary | ICD-10-CM | POA: Diagnosis present

## 2019-08-26 DIAGNOSIS — Z96651 Presence of right artificial knee joint: Secondary | ICD-10-CM

## 2019-08-26 DIAGNOSIS — G8918 Other acute postprocedural pain: Secondary | ICD-10-CM | POA: Diagnosis not present

## 2019-08-26 HISTORY — PX: TOTAL KNEE ARTHROPLASTY: SHX125

## 2019-08-26 LAB — TYPE AND SCREEN
ABO/RH(D): A NEG
Antibody Screen: NEGATIVE

## 2019-08-26 SURGERY — ARTHROPLASTY, KNEE, TOTAL
Anesthesia: Spinal | Site: Knee | Laterality: Right

## 2019-08-26 MED ORDER — DEXAMETHASONE SODIUM PHOSPHATE 10 MG/ML IJ SOLN
INTRAMUSCULAR | Status: AC
  Filled 2019-08-26: qty 1

## 2019-08-26 MED ORDER — LORATADINE 10 MG PO TABS
5.0000 mg | ORAL_TABLET | Freq: Every evening | ORAL | Status: DC
  Filled 2019-08-26: qty 0.5

## 2019-08-26 MED ORDER — STERILE WATER FOR IRRIGATION IR SOLN
Status: DC | PRN
  Administered 2019-08-26: 2000 mL

## 2019-08-26 MED ORDER — PHENAZOPYRIDINE HCL 200 MG PO TABS
200.0000 mg | ORAL_TABLET | Freq: Three times a day (TID) | ORAL | Status: DC | PRN
  Filled 2019-08-26: qty 1

## 2019-08-26 MED ORDER — 0.9 % SODIUM CHLORIDE (POUR BTL) OPTIME
TOPICAL | Status: DC | PRN
  Administered 2019-08-26: 1000 mL

## 2019-08-26 MED ORDER — KETOROLAC TROMETHAMINE 30 MG/ML IJ SOLN
INTRAMUSCULAR | Status: AC
  Filled 2019-08-26: qty 1

## 2019-08-26 MED ORDER — POLYETHYLENE GLYCOL 3350 17 G PO PACK
17.0000 g | PACK | Freq: Two times a day (BID) | ORAL | Status: DC
  Administered 2019-08-27 – 2019-08-28 (×4): 17 g via ORAL
  Filled 2019-08-26 (×6): qty 1

## 2019-08-26 MED ORDER — MOMETASONE FURO-FORMOTEROL FUM 200-5 MCG/ACT IN AERO
2.0000 | INHALATION_SPRAY | Freq: Two times a day (BID) | RESPIRATORY_TRACT | Status: DC
  Administered 2019-08-27 – 2019-08-29 (×4): 2 via RESPIRATORY_TRACT
  Filled 2019-08-26: qty 8.8

## 2019-08-26 MED ORDER — ONDANSETRON HCL 4 MG PO TABS
4.0000 mg | ORAL_TABLET | Freq: Four times a day (QID) | ORAL | Status: DC | PRN
  Administered 2019-08-27: 4 mg via ORAL
  Filled 2019-08-26: qty 1

## 2019-08-26 MED ORDER — LUBIPROSTONE 24 MCG PO CAPS
24.0000 ug | ORAL_CAPSULE | Freq: Every day | ORAL | Status: DC | PRN
  Administered 2019-08-27 – 2019-08-29 (×2): 24 ug via ORAL
  Filled 2019-08-26 (×5): qty 1

## 2019-08-26 MED ORDER — SODIUM CHLORIDE (PF) 0.9 % IJ SOLN
INTRAMUSCULAR | Status: AC
  Filled 2019-08-26: qty 50

## 2019-08-26 MED ORDER — METHYLPREDNISOLONE ACETATE 40 MG/ML IJ SUSP
INTRAMUSCULAR | Status: AC
  Filled 2019-08-26: qty 2

## 2019-08-26 MED ORDER — SODIUM CHLORIDE 0.9 % IR SOLN
Status: DC | PRN
  Administered 2019-08-26: 1000 mL

## 2019-08-26 MED ORDER — KETOROLAC TROMETHAMINE 30 MG/ML IJ SOLN
INTRAMUSCULAR | Status: DC | PRN
  Administered 2019-08-26: 30 mg

## 2019-08-26 MED ORDER — RIVAROXABAN 10 MG PO TABS
10.0000 mg | ORAL_TABLET | ORAL | Status: DC
  Administered 2019-08-27 – 2019-08-29 (×3): 10 mg via ORAL
  Filled 2019-08-26 (×3): qty 1

## 2019-08-26 MED ORDER — PHENYLEPHRINE 40 MCG/ML (10ML) SYRINGE FOR IV PUSH (FOR BLOOD PRESSURE SUPPORT)
PREFILLED_SYRINGE | INTRAVENOUS | Status: DC | PRN
  Administered 2019-08-26 (×2): 80 ug via INTRAVENOUS
  Administered 2019-08-26: 120 ug via INTRAVENOUS

## 2019-08-26 MED ORDER — SODIUM CHLORIDE 0.9 % IV SOLN: INTRAVENOUS | Status: DC

## 2019-08-26 MED ORDER — PROPOFOL 10 MG/ML IV BOLUS
INTRAVENOUS | Status: DC | PRN
  Administered 2019-08-26: 20 mg via INTRAVENOUS

## 2019-08-26 MED ORDER — PHENYLEPHRINE HCL-NACL 10-0.9 MG/250ML-% IV SOLN
INTRAVENOUS | Status: DC | PRN
  Administered 2019-08-26: 50 ug/min via INTRAVENOUS

## 2019-08-26 MED ORDER — CEFAZOLIN SODIUM-DEXTROSE 2-4 GM/100ML-% IV SOLN
2.0000 g | Freq: Four times a day (QID) | INTRAVENOUS | Status: AC
  Administered 2019-08-26 (×2): 2 g via INTRAVENOUS
  Filled 2019-08-26 (×2): qty 100

## 2019-08-26 MED ORDER — HYDROCORTISONE NA SUCCINATE PF 100 MG IJ SOLR
INTRAMUSCULAR | Status: AC
  Filled 2019-08-26: qty 4

## 2019-08-26 MED ORDER — DOCUSATE SODIUM 100 MG PO CAPS
100.0000 mg | ORAL_CAPSULE | Freq: Two times a day (BID) | ORAL | Status: DC
  Administered 2019-08-27 – 2019-08-29 (×6): 100 mg via ORAL
  Filled 2019-08-26 (×6): qty 1

## 2019-08-26 MED ORDER — LACTATED RINGERS IV SOLN: INTRAVENOUS | Status: DC

## 2019-08-26 MED ORDER — PROPOFOL 10 MG/ML IV BOLUS
INTRAVENOUS | Status: AC
  Filled 2019-08-26: qty 40

## 2019-08-26 MED ORDER — BUPIVACAINE HCL 0.25 % IJ SOLN
INTRAMUSCULAR | Status: AC
  Filled 2019-08-26: qty 1

## 2019-08-26 MED ORDER — HYDROMORPHONE HCL 1 MG/ML IJ SOLN
0.5000 mg | INTRAMUSCULAR | Status: DC | PRN
  Administered 2019-08-26: 1 mg via INTRAVENOUS
  Filled 2019-08-26: qty 1

## 2019-08-26 MED ORDER — METHOCARBAMOL 500 MG PO TABS
500.0000 mg | ORAL_TABLET | Freq: Four times a day (QID) | ORAL | Status: DC | PRN
  Administered 2019-08-26 – 2019-08-28 (×7): 500 mg via ORAL
  Filled 2019-08-26 (×7): qty 1

## 2019-08-26 MED ORDER — BUPIVACAINE IN DEXTROSE 0.75-8.25 % IT SOLN
INTRATHECAL | Status: DC | PRN
  Administered 2019-08-26: 1.4 mL via INTRATHECAL

## 2019-08-26 MED ORDER — ALPRAZOLAM 0.5 MG PO TABS
0.5000 mg | ORAL_TABLET | Freq: Every evening | ORAL | Status: DC | PRN
  Administered 2019-08-27 – 2019-08-28 (×3): 0.5 mg via ORAL
  Filled 2019-08-26 (×3): qty 1

## 2019-08-26 MED ORDER — ONDANSETRON HCL 4 MG/2ML IJ SOLN
4.0000 mg | Freq: Four times a day (QID) | INTRAMUSCULAR | Status: DC | PRN
  Administered 2019-08-26: 4 mg via INTRAVENOUS
  Filled 2019-08-26: qty 2

## 2019-08-26 MED ORDER — BISACODYL 10 MG RE SUPP: 10.0000 mg | Freq: Every day | RECTAL | Status: DC | PRN

## 2019-08-26 MED ORDER — ZOLPIDEM TARTRATE 5 MG PO TABS
5.0000 mg | ORAL_TABLET | Freq: Every day | ORAL | Status: DC
  Administered 2019-08-26 – 2019-08-29 (×3): 5 mg via ORAL
  Filled 2019-08-26 (×3): qty 1

## 2019-08-26 MED ORDER — LORATADINE 10 MG PO TABS
10.0000 mg | ORAL_TABLET | Freq: Every day | ORAL | Status: DC
  Administered 2019-08-27 – 2019-08-28 (×3): 10 mg via ORAL
  Filled 2019-08-26 (×3): qty 1

## 2019-08-26 MED ORDER — HYDROXYZINE HCL 25 MG PO TABS
25.0000 mg | ORAL_TABLET | Freq: Three times a day (TID) | ORAL | Status: DC | PRN
  Administered 2019-08-27 – 2019-08-29 (×7): 25 mg via ORAL
  Filled 2019-08-26 (×7): qty 1

## 2019-08-26 MED ORDER — PROPOFOL 500 MG/50ML IV EMUL
INTRAVENOUS | Status: DC | PRN
  Administered 2019-08-26: 25 ug/kg/min via INTRAVENOUS

## 2019-08-26 MED ORDER — PROPOFOL 1000 MG/100ML IV EMUL
INTRAVENOUS | Status: AC
  Filled 2019-08-26: qty 100

## 2019-08-26 MED ORDER — MENTHOL 3 MG MT LOZG
1.0000 | LOZENGE | OROMUCOSAL | Status: DC | PRN
  Filled 2019-08-26: qty 9

## 2019-08-26 MED ORDER — MAGNESIUM CITRATE PO SOLN: 1.0000 | Freq: Once | ORAL | Status: DC | PRN

## 2019-08-26 MED ORDER — POVIDONE-IODINE 10 % EX SWAB
2.0000 "application " | Freq: Once | CUTANEOUS | Status: AC
  Administered 2019-08-26: 2 via TOPICAL

## 2019-08-26 MED ORDER — OMEPRAZOLE 20 MG PO CPDR
20.0000 mg | DELAYED_RELEASE_CAPSULE | Freq: Every day | ORAL | Status: DC
  Administered 2019-08-26 – 2019-08-28 (×3): 20 mg via ORAL
  Filled 2019-08-26 (×4): qty 1

## 2019-08-26 MED ORDER — POTASSIUM CHLORIDE CRYS ER 20 MEQ PO TBCR
20.0000 meq | EXTENDED_RELEASE_TABLET | Freq: Every day | ORAL | Status: DC
  Administered 2019-08-27 – 2019-08-29 (×3): 20 meq via ORAL
  Filled 2019-08-26 (×4): qty 1

## 2019-08-26 MED ORDER — LIDOCAINE HCL (PF) 1 % IJ SOLN
INTRAMUSCULAR | Status: AC
  Filled 2019-08-26: qty 30

## 2019-08-26 MED ORDER — ORAL CARE MOUTH RINSE: 15.0000 mL | Freq: Once | OROMUCOSAL | Status: AC

## 2019-08-26 MED ORDER — METOCLOPRAMIDE HCL 5 MG/ML IJ SOLN
5.0000 mg | Freq: Three times a day (TID) | INTRAMUSCULAR | Status: DC | PRN
  Administered 2019-08-26: 10 mg via INTRAVENOUS
  Filled 2019-08-26: qty 2

## 2019-08-26 MED ORDER — ALUM & MAG HYDROXIDE-SIMETH 200-200-20 MG/5ML PO SUSP: 15.0000 mL | ORAL | Status: DC | PRN

## 2019-08-26 MED ORDER — OXYCODONE HCL 5 MG PO TABS
5.0000 mg | ORAL_TABLET | ORAL | Status: DC | PRN
  Administered 2019-08-26 – 2019-08-28 (×6): 5 mg via ORAL
  Filled 2019-08-26 (×7): qty 1

## 2019-08-26 MED ORDER — LORATADINE 10 MG PO TABS
10.0000 mg | ORAL_TABLET | Freq: Every day | ORAL | Status: DC
  Administered 2019-08-27 – 2019-08-29 (×3): 10 mg via ORAL
  Filled 2019-08-26 (×3): qty 1

## 2019-08-26 MED ORDER — MONTELUKAST SODIUM 10 MG PO TABS
10.0000 mg | ORAL_TABLET | Freq: Every day | ORAL | Status: DC
  Administered 2019-08-27 – 2019-08-28 (×3): 10 mg via ORAL
  Filled 2019-08-26 (×3): qty 1

## 2019-08-26 MED ORDER — CHLORHEXIDINE GLUCONATE 0.12 % MT SOLN
15.0000 mL | Freq: Once | OROMUCOSAL | Status: AC
  Administered 2019-08-26: 15 mL via OROMUCOSAL
  Filled 2019-08-26: qty 15

## 2019-08-26 MED ORDER — GABAPENTIN 300 MG PO CAPS
300.0000 mg | ORAL_CAPSULE | Freq: Every day | ORAL | Status: DC
  Administered 2019-08-27 – 2019-08-28 (×3): 300 mg via ORAL
  Filled 2019-08-26 (×3): qty 1

## 2019-08-26 MED ORDER — AMITRIPTYLINE HCL 100 MG PO TABS
100.0000 mg | ORAL_TABLET | Freq: Every day | ORAL | Status: DC
  Administered 2019-08-27 – 2019-08-28 (×2): 100 mg via ORAL
  Filled 2019-08-26 (×4): qty 1

## 2019-08-26 MED ORDER — SODIUM CHLORIDE (PF) 0.9 % IJ SOLN
INTRAMUSCULAR | Status: DC | PRN
  Administered 2019-08-26: 30 mL

## 2019-08-26 MED ORDER — DEXAMETHASONE SODIUM PHOSPHATE 10 MG/ML IJ SOLN
10.0000 mg | Freq: Once | INTRAMUSCULAR | Status: AC
  Administered 2019-08-26: 10 mg via INTRAVENOUS

## 2019-08-26 MED ORDER — FENTANYL CITRATE (PF) 100 MCG/2ML IJ SOLN
INTRAMUSCULAR | Status: DC | PRN
  Administered 2019-08-26 (×4): 25 ug via INTRAVENOUS

## 2019-08-26 MED ORDER — BENZONATATE 100 MG PO CAPS: 100.0000 mg | ORAL_CAPSULE | Freq: Three times a day (TID) | ORAL | Status: DC | PRN

## 2019-08-26 MED ORDER — TRANEXAMIC ACID-NACL 1000-0.7 MG/100ML-% IV SOLN
1000.0000 mg | INTRAVENOUS | Status: AC
  Administered 2019-08-26: 1000 mg via INTRAVENOUS
  Filled 2019-08-26: qty 100

## 2019-08-26 MED ORDER — VALACYCLOVIR HCL 500 MG PO TABS
500.0000 mg | ORAL_TABLET | Freq: Two times a day (BID) | ORAL | Status: DC | PRN
  Filled 2019-08-26: qty 1

## 2019-08-26 MED ORDER — MIDAZOLAM HCL 2 MG/2ML IJ SOLN
INTRAMUSCULAR | Status: AC
  Filled 2019-08-26: qty 2

## 2019-08-26 MED ORDER — OXYCODONE HCL 5 MG PO TABS
10.0000 mg | ORAL_TABLET | ORAL | Status: DC | PRN
  Administered 2019-08-26 – 2019-08-29 (×9): 10 mg via ORAL
  Filled 2019-08-26 (×10): qty 2

## 2019-08-26 MED ORDER — ALBUTEROL SULFATE (2.5 MG/3ML) 0.083% IN NEBU: 3.0000 mL | INHALATION_SOLUTION | Freq: Four times a day (QID) | RESPIRATORY_TRACT | Status: DC | PRN

## 2019-08-26 MED ORDER — METOCLOPRAMIDE HCL 5 MG PO TABS: 5.0000 mg | ORAL_TABLET | Freq: Three times a day (TID) | ORAL | Status: DC | PRN

## 2019-08-26 MED ORDER — ACETAMINOPHEN 325 MG PO TABS: 325.0000 mg | ORAL_TABLET | Freq: Four times a day (QID) | ORAL | Status: DC | PRN

## 2019-08-26 MED ORDER — METHOCARBAMOL 500 MG IVPB - SIMPLE MED
500.0000 mg | Freq: Four times a day (QID) | INTRAVENOUS | Status: DC | PRN
  Filled 2019-08-26: qty 50

## 2019-08-26 MED ORDER — ONDANSETRON HCL 4 MG/2ML IJ SOLN
INTRAMUSCULAR | Status: AC
  Filled 2019-08-26: qty 2

## 2019-08-26 MED ORDER — BUPIVACAINE HCL (PF) 0.5 % IJ SOLN
INTRAMUSCULAR | Status: DC | PRN
  Administered 2019-08-26: 20 mL via PERINEURAL

## 2019-08-26 MED ORDER — HYDROMORPHONE HCL 1 MG/ML IJ SOLN: 0.2500 mg | INTRAMUSCULAR | Status: DC | PRN

## 2019-08-26 MED ORDER — PHENOL 1.4 % MT LIQD
1.0000 | OROMUCOSAL | Status: DC | PRN
  Administered 2019-08-29: 1 via OROMUCOSAL
  Filled 2019-08-26: qty 177

## 2019-08-26 MED ORDER — DEXAMETHASONE SODIUM PHOSPHATE 10 MG/ML IJ SOLN
10.0000 mg | Freq: Once | INTRAMUSCULAR | Status: AC
  Administered 2019-08-27: 10 mg via INTRAVENOUS
  Filled 2019-08-26: qty 1

## 2019-08-26 MED ORDER — ONDANSETRON HCL 4 MG/2ML IJ SOLN
INTRAMUSCULAR | Status: DC | PRN
  Administered 2019-08-26: 4 mg via INTRAVENOUS

## 2019-08-26 MED ORDER — BUPIVACAINE HCL (PF) 0.25 % IJ SOLN
INTRAMUSCULAR | Status: DC | PRN
  Administered 2019-08-26: 30 mL

## 2019-08-26 MED ORDER — DICYCLOMINE HCL 10 MG PO CAPS
10.0000 mg | ORAL_CAPSULE | Freq: Three times a day (TID) | ORAL | Status: DC | PRN
  Filled 2019-08-26: qty 1

## 2019-08-26 MED ORDER — CEFAZOLIN SODIUM-DEXTROSE 2-4 GM/100ML-% IV SOLN
2.0000 g | INTRAVENOUS | Status: AC
  Administered 2019-08-26: 2 g via INTRAVENOUS
  Filled 2019-08-26: qty 100

## 2019-08-26 MED ORDER — FERROUS SULFATE 325 (65 FE) MG PO TABS
325.0000 mg | ORAL_TABLET | Freq: Two times a day (BID) | ORAL | Status: DC
  Administered 2019-08-27 – 2019-08-29 (×6): 325 mg via ORAL
  Filled 2019-08-26 (×7): qty 1

## 2019-08-26 MED ORDER — TRANEXAMIC ACID-NACL 1000-0.7 MG/100ML-% IV SOLN
1000.0000 mg | Freq: Once | INTRAVENOUS | Status: AC
  Administered 2019-08-26: 1000 mg via INTRAVENOUS
  Filled 2019-08-26: qty 100

## 2019-08-26 MED ORDER — FLUTICASONE PROPIONATE 50 MCG/ACT NA SUSP
2.0000 | Freq: Every day | NASAL | Status: DC
  Administered 2019-08-27 – 2019-08-29 (×2): 2 via NASAL
  Filled 2019-08-26: qty 16

## 2019-08-26 MED ORDER — FENTANYL CITRATE (PF) 100 MCG/2ML IJ SOLN
INTRAMUSCULAR | Status: AC
  Filled 2019-08-26: qty 2

## 2019-08-26 SURGICAL SUPPLY — 59 items
ATTUNE MED ANAT PAT 35 KNEE (Knees) ×1 IMPLANT
ATTUNE PSFEM RTSZ4 NARCEM KNEE (Femur) ×1 IMPLANT
ATTUNE PSRP INSR SZ4 8 KNEE (Insert) ×1 IMPLANT
BAG ZIPLOCK 12X15 (MISCELLANEOUS) IMPLANT
BASEPLATE TIBIAL ROTATING SZ 4 (Knees) ×1 IMPLANT
BLADE SAW SGTL 11.0X1.19X90.0M (BLADE) IMPLANT
BLADE SAW SGTL 13.0X1.19X90.0M (BLADE) ×2 IMPLANT
BLADE SURG SZ10 CARB STEEL (BLADE) ×4 IMPLANT
BNDG ELASTIC 6X5.8 VLCR STR LF (GAUZE/BANDAGES/DRESSINGS) ×2 IMPLANT
BOWL SMART MIX CTS (DISPOSABLE) ×2 IMPLANT
CEMENT HV SMART SET (Cement) ×2 IMPLANT
COVER SURGICAL LIGHT HANDLE (MISCELLANEOUS) ×2 IMPLANT
COVER WAND RF STERILE (DRAPES) IMPLANT
CUFF TOURN SGL QUICK 34 (TOURNIQUET CUFF) ×2
CUFF TRNQT CYL 34X4.125X (TOURNIQUET CUFF) ×1 IMPLANT
DECANTER SPIKE VIAL GLASS SM (MISCELLANEOUS) ×4 IMPLANT
DERMABOND ADVANCED (GAUZE/BANDAGES/DRESSINGS) ×1
DERMABOND ADVANCED .7 DNX12 (GAUZE/BANDAGES/DRESSINGS) ×1 IMPLANT
DRAPE U-SHAPE 47X51 STRL (DRAPES) ×2 IMPLANT
DRESSING AQUACEL AG SP 3.5X10 (GAUZE/BANDAGES/DRESSINGS) ×1 IMPLANT
DRSG AQUACEL AG SP 3.5X10 (GAUZE/BANDAGES/DRESSINGS) ×2
DURAPREP 26ML APPLICATOR (WOUND CARE) ×4 IMPLANT
ELECT REM PT RETURN 15FT ADLT (MISCELLANEOUS) ×2 IMPLANT
GLOVE BIO SURGEON STRL SZ 6 (GLOVE) ×2 IMPLANT
GLOVE BIOGEL PI IND STRL 6.5 (GLOVE) ×1 IMPLANT
GLOVE BIOGEL PI IND STRL 7.5 (GLOVE) ×1 IMPLANT
GLOVE BIOGEL PI IND STRL 8.5 (GLOVE) ×1 IMPLANT
GLOVE BIOGEL PI INDICATOR 6.5 (GLOVE) ×1
GLOVE BIOGEL PI INDICATOR 7.5 (GLOVE) ×1
GLOVE BIOGEL PI INDICATOR 8.5 (GLOVE) ×1
GLOVE ECLIPSE 8.0 STRL XLNG CF (GLOVE) ×2 IMPLANT
GLOVE ORTHO TXT STRL SZ7.5 (GLOVE) ×2 IMPLANT
GOWN STRL REUS W/ TWL LRG LVL3 (GOWN DISPOSABLE) ×1 IMPLANT
GOWN STRL REUS W/TWL 2XL LVL3 (GOWN DISPOSABLE) ×2 IMPLANT
GOWN STRL REUS W/TWL LRG LVL3 (GOWN DISPOSABLE) ×4 IMPLANT
HANDPIECE INTERPULSE COAX TIP (DISPOSABLE) ×2
HOLDER FOLEY CATH W/STRAP (MISCELLANEOUS) IMPLANT
KIT TURNOVER KIT A (KITS) IMPLANT
MANIFOLD NEPTUNE II (INSTRUMENTS) ×2 IMPLANT
NDL SAFETY ECLIPSE 18X1.5 (NEEDLE) IMPLANT
NEEDLE HYPO 18GX1.5 SHARP (NEEDLE)
NS IRRIG 1000ML POUR BTL (IV SOLUTION) ×2 IMPLANT
PACK TOTAL KNEE CUSTOM (KITS) ×2 IMPLANT
PENCIL SMOKE EVACUATOR (MISCELLANEOUS) IMPLANT
PIN DRILL FIX HALF THREAD (BIT) ×1 IMPLANT
PIN FIX SIGMA LCS THRD HI (PIN) ×1 IMPLANT
PROTECTOR NERVE ULNAR (MISCELLANEOUS) ×2 IMPLANT
SET HNDPC FAN SPRY TIP SCT (DISPOSABLE) ×1 IMPLANT
SET PAD KNEE POSITIONER (MISCELLANEOUS) ×2 IMPLANT
SUT MNCRL AB 4-0 PS2 18 (SUTURE) ×2 IMPLANT
SUT STRATAFIX PDS+ 0 24IN (SUTURE) ×2 IMPLANT
SUT VIC AB 1 CT1 36 (SUTURE) ×2 IMPLANT
SUT VIC AB 2-0 CT1 27 (SUTURE) ×6
SUT VIC AB 2-0 CT1 TAPERPNT 27 (SUTURE) ×3 IMPLANT
SYR 3ML LL SCALE MARK (SYRINGE) ×2 IMPLANT
TRAY FOLEY MTR SLVR 16FR STAT (SET/KITS/TRAYS/PACK) ×2 IMPLANT
WATER STERILE IRR 1000ML POUR (IV SOLUTION) ×4 IMPLANT
WRAP KNEE MAXI GEL POST OP (GAUZE/BANDAGES/DRESSINGS) ×2 IMPLANT
YANKAUER SUCT BULB TIP 10FT TU (MISCELLANEOUS) ×2 IMPLANT

## 2019-08-26 NOTE — Transfer of Care (Signed)
Immediate Anesthesia Transfer of Care Note  Patient: Alexis Olson  Procedure(s) Performed: RIGHT TOTAL KNEE ARTHROPLASTY, LEFT KNEE CORTISONE INJECTION (Right Knee)  Patient Location: PACU  Anesthesia Type:Spinal and MAC combined with regional for post-op pain  Level of Consciousness: awake, alert  and patient cooperative  Airway & Oxygen Therapy: Patient Spontanous Breathing and Patient connected to face mask oxygen  Post-op Assessment: Report given to RN and Post -op Vital signs reviewed and stable  Post vital signs: Reviewed and stable  Last Vitals:  Vitals Value Taken Time  BP 106/60 08/26/19 0912  Temp    Pulse 87 08/26/19 0913  Resp 12 08/26/19 0914  SpO2 98 % 08/26/19 0913  Vitals shown include unvalidated device data.  Last Pain:  Vitals:   08/26/19 0635  TempSrc:   PainSc: 0-No pain      Patients Stated Pain Goal: 3 (73/22/02 5427)  Complications: No apparent anesthesia complications

## 2019-08-26 NOTE — Interval H&P Note (Signed)
History and Physical Interval Note:  08/26/2019 7:04 AM  Alexis Olson  has presented today for surgery, with the diagnosis of Right knee osteoarthritis and left knee osteoarthritis.  The various methods of treatment have been discussed with the patient and family. After consideration of risks, benefits and other options for treatment, the patient has consented to  Procedure(s) with comments: RIGHT TOTAL KNEE ARTHROPLASTY, LEFT KNEE CORTISONE INJECTION (Right) - 70 MINS as a surgical intervention.  The patient's history has been reviewed, patient examined, no change in status, stable for surgery.  I have reviewed the patient's chart and labs.  Questions were answered to the patient's satisfaction.     Mauri Pole

## 2019-08-26 NOTE — Anesthesia Preprocedure Evaluation (Signed)
Anesthesia Evaluation  Patient identified by MRN, date of birth, ID band Patient awake    Reviewed: Allergy & Precautions, NPO status , Patient's Chart, lab work & pertinent test results  Airway Mallampati: II  TM Distance: >3 FB Neck ROM: Full    Dental no notable dental hx.    Pulmonary neg pulmonary ROS,    Pulmonary exam normal breath sounds clear to auscultation       Cardiovascular hypertension, + DVT  Normal cardiovascular exam Rhythm:Regular Rate:Normal     Neuro/Psych negative neurological ROS  negative psych ROS   GI/Hepatic Neg liver ROS, GERD  ,  Endo/Other  negative endocrine ROS  Renal/GU negative Renal ROS  negative genitourinary   Musculoskeletal negative musculoskeletal ROS (+)   Abdominal   Peds negative pediatric ROS (+)  Hematology negative hematology ROS (+)   Anesthesia Other Findings   Reproductive/Obstetrics negative OB ROS                             Anesthesia Physical Anesthesia Plan  ASA: III  Anesthesia Plan: Spinal   Post-op Pain Management:  Regional for Post-op pain   Induction: Intravenous  PONV Risk Score and Plan: 2 and Ondansetron and Dexamethasone  Airway Management Planned: Simple Face Mask  Additional Equipment:   Intra-op Plan:   Post-operative Plan:   Informed Consent: I have reviewed the patients History and Physical, chart, labs and discussed the procedure including the risks, benefits and alternatives for the proposed anesthesia with the patient or authorized representative who has indicated his/her understanding and acceptance.     Dental advisory given  Plan Discussed with: CRNA and Surgeon  Anesthesia Plan Comments:         Anesthesia Quick Evaluation

## 2019-08-26 NOTE — Anesthesia Procedure Notes (Signed)
Anesthesia Regional Block: Adductor canal block   Pre-Anesthetic Checklist: ,, timeout performed, Correct Patient, Correct Site, Correct Laterality, Correct Procedure, Correct Position, site marked, Risks and benefits discussed,  Surgical consent,  Pre-op evaluation,  At surgeon's request and post-op pain management  Laterality: Right  Prep: chloraprep       Needles:  Injection technique: Single-shot  Needle Type: Echogenic Needle     Needle Length: 9cm      Additional Needles:   Procedures:,,,, ultrasound used (permanent image in chart),,,,  Narrative:  Start time: 08/26/2019 6:50 AM End time: 08/26/2019 6:56 AM Injection made incrementally with aspirations every 5 mL.  Performed by: Personally  Anesthesiologist: Myrtie Soman, MD  Additional Notes: Patient tolerated the procedure well without complications

## 2019-08-26 NOTE — Discharge Instructions (Addendum)
INSTRUCTIONS AFTER JOINT REPLACEMENT   o Remove items at home which could result in a fall. This includes throw rugs or furniture in walking pathways o ICE to the affected joint every three hours while awake for 30 minutes at a time, for at least the first 3-5 days, and then as needed for pain and swelling.  Continue to use ice for pain and swelling. You may notice swelling that will progress down to the foot and ankle.  This is normal after surgery.  Elevate your leg when you are not up walking on it.   o Continue to use the breathing machine you got in the hospital (incentive spirometer) which will help keep your temperature down.  It is common for your temperature to cycle up and down following surgery, especially at night when you are not up moving around and exerting yourself.  The breathing machine keeps your lungs expanded and your temperature down.   DIET:  As you were doing prior to hospitalization, we recommend a well-balanced diet.  DRESSING / WOUND CARE / SHOWERING  Keep the surgical dressing until follow up.  The dressing is water proof, so you can shower without any extra covering.  IF THE DRESSING FALLS OFF or the wound gets wet inside, change the dressing with sterile gauze.  Please use good hand washing techniques before changing the dressing.  Do not use any lotions or creams on the incision until instructed by your surgeon.    ACTIVITY  o Increase activity slowly as tolerated, but follow the weight bearing instructions below.   o No driving for 6 weeks or until further direction given by your physician.  You cannot drive while taking narcotics.  o No lifting or carrying greater than 10 lbs. until further directed by your surgeon. o Avoid periods of inactivity such as sitting longer than an hour when not asleep. This helps prevent blood clots.  o You may return to work once you are authorized by your doctor.     WEIGHT BEARING   Weight bearing as tolerated with assist  device (walker, cane, etc) as directed, use it as long as suggested by your surgeon or therapist, typically at least 4-6 weeks.   EXERCISES  Results after joint replacement surgery are often greatly improved when you follow the exercise, range of motion and muscle strengthening exercises prescribed by your doctor. Safety measures are also important to protect the joint from further injury. Any time any of these exercises cause you to have increased pain or swelling, decrease what you are doing until you are comfortable again and then slowly increase them. If you have problems or questions, call your caregiver or physical therapist for advice.   Rehabilitation is important following a joint replacement. After just a few days of immobilization, the muscles of the leg can become weakened and shrink (atrophy).  These exercises are designed to build up the tone and strength of the thigh and leg muscles and to improve motion. Often times heat used for twenty to thirty minutes before working out will loosen up your tissues and help with improving the range of motion but do not use heat for the first two weeks following surgery (sometimes heat can increase post-operative swelling).   These exercises can be done on a training (exercise) mat, on the floor, on a table or on a bed. Use whatever works the best and is most comfortable for you.    Use music or television while you are exercising so that   the exercises are a pleasant break in your day. This will make your life better with the exercises acting as a break in your routine that you can look forward to.   Perform all exercises about fifteen times, three times per day or as directed.  You should exercise both the operative leg and the other leg as well.  Exercises include:   . Quad Sets - Tighten up the muscle on the front of the thigh (Quad) and hold for 5-10 seconds.   . Straight Leg Raises - With your knee straight (if you were given a brace, keep it on),  lift the leg to 60 degrees, hold for 3 seconds, and slowly lower the leg.  Perform this exercise against resistance later as your leg gets stronger.  . Leg Slides: Lying on your back, slowly slide your foot toward your buttocks, bending your knee up off the floor (only go as far as is comfortable). Then slowly slide your foot back down until your leg is flat on the floor again.  . Angel Wings: Lying on your back spread your legs to the side as far apart as you can without causing discomfort.  . Hamstring Strength:  Lying on your back, push your heel against the floor with your leg straight by tightening up the muscles of your buttocks.  Repeat, but this time bend your knee to a comfortable angle, and push your heel against the floor.  You may put a pillow under the heel to make it more comfortable if necessary.   A rehabilitation program following joint replacement surgery can speed recovery and prevent re-injury in the future due to weakened muscles. Contact your doctor or a physical therapist for more information on knee rehabilitation.    CONSTIPATION  Constipation is defined medically as fewer than three stools per week and severe constipation as less than one stool per week.  Even if you have a regular bowel pattern at home, your normal regimen is likely to be disrupted due to multiple reasons following surgery.  Combination of anesthesia, postoperative narcotics, change in appetite and fluid intake all can affect your bowels.   YOU MUST use at least one of the following options; they are listed in order of increasing strength to get the job done.  They are all available over the counter, and you may need to use some, POSSIBLY even all of these options:    Drink plenty of fluids (prune juice may be helpful) and high fiber foods Colace 100 mg by mouth twice a day  Senokot for constipation as directed and as needed Dulcolax (bisacodyl), take with full glass of water  Miralax (polyethylene glycol)  once or twice a day as needed.  If you have tried all these things and are unable to have a bowel movement in the first 3-4 days after surgery call either your surgeon or your primary doctor.    If you experience loose stools or diarrhea, hold the medications until you stool forms back up.  If your symptoms do not get better within 1 week or if they get worse, check with your doctor.  If you experience "the worst abdominal pain ever" or develop nausea or vomiting, please contact the office immediately for further recommendations for treatment.   ITCHING:  If you experience itching with your medications, try taking only a single pain pill, or even half a pain pill at a time.  You can also use Benadryl over the counter for itching or also to   help with sleep.   TED HOSE STOCKINGS:  Use stockings on both legs until for at least 2 weeks or as directed by physician office. They may be removed at night for sleeping.  MEDICATIONS:  See your medication summary on the "After Visit Summary" that nursing will review with you.  You may have some home medications which will be placed on hold until you complete the course of blood thinner medication.  It is important for you to complete the blood thinner medication as prescribed.  Information on my medicine - XARELTO (Rivaroxaban)  Why was Xarelto prescribed for you? Xarelto was prescribed for you to reduce the risk of blood clots forming after orthopedic surgery. The medical term for these abnormal blood clots is venous thromboembolism (VTE).  What do you need to know about xarelto ? Take your Xarelto ONCE DAILY at the same time every day. You may take it either with or without food.  If you have difficulty swallowing the tablet whole, you may crush it and mix in applesauce just prior to taking your dose.  Take Xarelto exactly as prescribed by your doctor and DO NOT stop taking Xarelto without talking to the doctor who prescribed the medication.   Stopping without other VTE prevention medication to take the place of Xarelto may increase your risk of developing a clot.  After discharge, you should have regular check-up appointments with your healthcare provider that is prescribing your Xarelto.    What do you do if you miss a dose? If you miss a dose, take it as soon as you remember on the same day then continue your regularly scheduled once daily regimen the next day. Do not take two doses of Xarelto on the same day.   Important Safety Information A possible side effect of Xarelto is bleeding. You should call your healthcare provider right away if you experience any of the following: ? Bleeding from an injury or your nose that does not stop. ? Unusual colored urine (red or dark brown) or unusual colored stools (red or black). ? Unusual bruising for unknown reasons. ? A serious fall or if you hit your head (even if there is no bleeding).  Some medicines may interact with Xarelto and might increase your risk of bleeding while on Xarelto. To help avoid this, consult your healthcare provider or pharmacist prior to using any new prescription or non-prescription medications, including herbals, vitamins, non-steroidal anti-inflammatory drugs (NSAIDs) and supplements.  This website has more information on Xarelto: [email or web address]     PRECAUTIONS:  If you experience chest pain or shortness of breath - call 911 immediately for transfer to the hospital emergency department.   If you develop a fever greater that 101 F, purulent drainage from wound, increased redness or drainage from wound, foul odor from the wound/dressing, or calf pain - CONTACT YOUR SURGEON.                                                   FOLLOW-UP APPOINTMENTS:  If you do not already have a post-op appointment, please call the office for an appointment to be seen by your surgeon.  Guidelines for how soon to be seen are listed in your "After Visit Summary", but are  typically between 1-4 weeks after surgery.  OTHER INSTRUCTIONS:   Knee Replacement:  Do not place pillow under  knee, focus on keeping the knee straight while resting.    DENTAL ANTIBIOTICS:  In most cases prophylactic antibiotics for Dental procdeures after total joint surgery are not necessary.  Exceptions are as follows:  1. History of prior total joint infection  2. Severely immunocompromised (Organ Transplant, cancer chemotherapy, Rheumatoid biologic meds such as [place])  3. Poorly controlled diabetes (A1C &gt; 8.0, blood glucose over 200)  If you have one of these conditions, contact your surgeon for an antibiotic prescription, prior to your dental procedure.   MAKE SURE YOU:  . Understand these instructions.  . Get help right away if you are not doing well or get worse.    Thank you for letting us be a part of your medical care team.  It is a privilege we respect greatly.  We hope these instructions will help you stay on track for a fast and full recovery!

## 2019-08-26 NOTE — Anesthesia Procedure Notes (Signed)
Procedure Name: MAC Date/Time: 08/26/2019 7:16 AM Performed by: West Pugh, CRNA Pre-anesthesia Checklist: Patient identified, Emergency Drugs available, Suction available, Patient being monitored and Timeout performed Patient Re-evaluated:Patient Re-evaluated prior to induction Oxygen Delivery Method: Simple face mask Preoxygenation: Pre-oxygenation with 100% oxygen Induction Type: IV induction Placement Confirmation: positive ETCO2 Dental Injury: Teeth and Oropharynx as per pre-operative assessment

## 2019-08-26 NOTE — Anesthesia Procedure Notes (Signed)
Anesthesia Procedure Image    

## 2019-08-26 NOTE — Anesthesia Postprocedure Evaluation (Signed)
Anesthesia Post Note  Patient: Alexis Olson  Procedure(s) Performed: RIGHT TOTAL KNEE ARTHROPLASTY, LEFT KNEE CORTISONE INJECTION (Right Knee)     Patient location during evaluation: PACU Anesthesia Type: Spinal Level of consciousness: oriented and awake and alert Pain management: pain level controlled Vital Signs Assessment: post-procedure vital signs reviewed and stable Respiratory status: spontaneous breathing, respiratory function stable and patient connected to nasal cannula oxygen Cardiovascular status: blood pressure returned to baseline and stable Postop Assessment: no headache, no backache and no apparent nausea or vomiting Anesthetic complications: no    Last Vitals:  Vitals:   08/26/19 1227 08/26/19 1335  BP: 134/72 129/67  Pulse: 88 93  Resp: 16 16  Temp:  36.8 C  SpO2: 99% 100%    Last Pain:  Vitals:   08/26/19 1325  TempSrc:   PainSc: 4                  Matty Deamer S

## 2019-08-26 NOTE — Evaluation (Signed)
Physical Therapy Evaluation Patient Details Name: Alexis Olson MRN: [id number] DOB: [birth year] Today's Date: 08/26/2019   History of Present Illness  Patient is 86 y.o. female s/p Rt TKA on 08/26/19 with PMH significant for UTI's, obesity, osteomyelitis, pelvic fracture, HTN, HLD, GERD, hx of DVT, OA, asthma, anxiety, revascularization surgery to Rt LE.     Clinical Impression  Alexis Olson is a 86 y.o. female POD 0 s/p Rt TKA. Patient reports independence with mobility at baseline. Patient is now limited by functional impairments (see PT problem list below) and requires min-mod assist for transfers and gait with RW. Patient was able to ambulate ~4 feet with RW and min-mod assist to steady and manage walker during stand step transfer. Patient limited by pain this date and unable to advance gait distance. Patient instructed in exercise to facilitate circulation. Patient will benefit from continued skilled PT interventions to address impairments and progress towards PLOF. Acute PT will follow to progress mobility and stair training in preparation for safe discharge home.     Follow Up Recommendations Follow surgeon's recommendation for DC plan and follow-up therapies;Home health PT    Equipment Recommendations  None recommended by PT(pt has RW and BSC)    Recommendations for Other Services       Precautions / Restrictions Precautions Precautions: Fall Restrictions Weight Bearing Restrictions: No Other Position/Activity Restrictions: WBAT      Mobility  Bed Mobility Overal bed mobility: Needs Assistance Bed Mobility: Supine to Sit     Supine to sit: Min assist;HOB elevated     General bed mobility comments: cues for use of bed rail to assist with raising trunk. assist required for Rt LE mobility and to scoot to EOB.  Transfers Overall transfer level: Needs assistance Equipment used: Rolling walker (2 wheeled) Transfers: Sit to/from [facility] Sit to Stand:  Min assist;From elevated surface Stand pivot transfers: Min assist;From elevated surface;Mod assist       General transfer comment: cues for technique with RW for power up. pt preferring to have bil UE for power up form EOB. assist to steady with rising. cues for step pattern and proximity to RW for stand step transfer.  Ambulation/Gait Ambulation/Gait assistance: Min assist;Mod assist Gait Distance (Feet): 4 Feet Assistive device: Rolling walker (2 wheeled) Gait Pattern/deviations: Step-to pattern;Decreased step length - left;Decreased stance time - right;Decreased weight shift to right;Antalgic Gait velocity: decreased   General Gait Details: cues for proximity to RW and assist to manage with turn. pt required cues to look up to improve posture.  Stairs         Wheelchair Mobility    Modified Rankin (Stroke Patients Only)       Balance Overall balance assessment: Needs assistance Sitting-balance support: Feet supported Sitting balance-Leahy Scale: Fair     Standing balance support: During functional activity;Bilateral upper extremity supported Standing balance-Leahy Scale: Poor            Pertinent Vitals/Pain Pain Assessment: 0-10 Pain Score: 5  Pain Location: Rt knee Pain Descriptors / Indicators: Aching;Discomfort Pain Intervention(s): Limited activity within patient's tolerance;Monitored during session;Repositioned;Ice applied    Home Living Family/patient expects to be discharged to:: Private residence Living Arrangements: Spouse/significant other Available Help at Discharge: Family Type of Home: House Home Access: Stairs to enter Entrance Stairs-Rails: Left Entrance Stairs-Number of Steps: 5 Home Layout: One level Home Equipment: Grab bars - tub/shower;Shower seat - built in;Bedside commode;Walker - 2 wheels      Prior Function Level of Independence:  Independent with assistive device(s)         Comments: pt using RW for mobility. pt had a  recent fall in last week while leaving church.     Hand Dominance        Extremity/Trunk Assessment   Upper Extremity Assessment Upper Extremity Assessment: Generalized weakness    Lower Extremity Assessment Lower Extremity Assessment: Generalized weakness;RLE deficits/detail RLE Deficits / Details: pt limited by pain, 3/5 for quad strength with LAQ RLE: Unable to fully assess due to pain RLE Sensation: WNL RLE Coordination: WNL    Cervical / Trunk Assessment Cervical / Trunk Assessment: Kyphotic  Communication   Communication: No difficulties  Cognition Arousal/Alertness: Awake/alert Behavior During Therapy: WFL for tasks assessed/performed Overall Cognitive Status: Within Functional Limits for tasks assessed             General Comments General comments (skin integrity, edema, etc.): pt with bruising on Lt knee secondary to fall PTA    Exercises Total Joint Exercises Ankle Circles/Pumps: AROM;Both;20 reps;Seated   Assessment/Plan    PT Assessment Patient needs continued PT services  PT Problem List Decreased strength;Decreased range of motion;Decreased activity tolerance;Decreased balance;Decreased mobility;Decreased knowledge of use of DME;Decreased knowledge of precautions;Pain       PT Treatment Interventions DME instruction;Gait training;Stair training;Functional mobility training;Therapeutic activities;Therapeutic exercise;Balance training;Patient/family education    PT Goals (Current goals can be found in the Care Plan section)  Acute Rehab PT Goals Patient Stated Goal: stop hurting PT Goal Formulation: With patient Time For Goal Achievement: 09/02/19 Potential to Achieve Goals: Good    Frequency 7X/week    AM-PAC PT "6 Clicks" Mobility  Outcome Measure Help needed turning from your back to your side while in a flat bed without using bedrails?: A Little Help needed moving from lying on your back to sitting on the side of a flat bed without using  bedrails?: A Little Help needed moving to and from a bed to a chair (including a wheelchair)?: A Little Help needed standing up from a chair using your arms (e.g., wheelchair or bedside chair)?: A Little Help needed to walk in hospital room?: A Little Help needed climbing 3-5 steps with a railing? : A Lot 6 Click Score: 17    End of Session Equipment Utilized During Treatment: Gait belt Activity Tolerance: Patient tolerated treatment well Patient left: in chair;with call bell/phone within reach;with chair alarm set;with family/visitor present Nurse Communication: Mobility status PT Visit Diagnosis: Muscle weakness (generalized) (M62.81);Difficulty in walking, not elsewhere classified (R26.2)    Time: [id number] PT Time Calculation (min) (ACUTE ONLY): 24 min   Charges:   PT Evaluation $PT Eval Low Complexity: 1 Low PT Treatments $Therapeutic Activity: 8-22 mins       Verner Mould, DPT Physical Therapist with [facility] [phone]  08/26/2019 5:24 PM

## 2019-08-26 NOTE — Anesthesia Procedure Notes (Signed)
Spinal  Patient location during procedure: OR Start time: 08/26/2019 7:16 AM End time: 08/26/2019 7:22 AM Staffing Performed: anesthesiologist  Anesthesiologist: Myrtie Soman, MD Preanesthetic Checklist Completed: patient identified, IV checked, site marked, risks and benefits discussed, surgical consent, monitors and equipment checked, pre-op evaluation and timeout performed Spinal Block Patient position: sitting Prep: ChloraPrep Patient monitoring: heart rate, continuous pulse ox and blood pressure Approach: midline Location: L3-4 Injection technique: single-shot Needle Needle type: Sprotte  Needle gauge: 24 G Needle length: 9 cm Assessment Sensory level: T8 Additional Notes Expiration date of kit checked and confirmed. Patient tolerated procedure well, without complications.

## 2019-08-26 NOTE — Op Note (Addendum)
NAME:  Alexis Olson                      MEDICAL RECORD NO.:  [id number]                             FACILITY:  [facility]      PHYSICIAN:  Alexis Olson, M.D.  DATE OF BIRTH:  11-29-1933      DATE OF PROCEDURE:  08/26/2019                                     OPERATIVE REPORT         PREOPERATIVE DIAGNOSIS:  Right knee osteoarthritis. 2. Left knee osteoarthritis     POSTOPERATIVE DIAGNOSIS:  Right knee osteoarthritis. 2. Left knee osteoarthritis     FINDINGS:  The patient was noted to have complete loss of cartilage and   bone-on-bone arthritis with associated osteophytes in the lateral and patellofemoral compartments of   the knee with a significant synovitis and associated effusion.  The patient had failed months of conservative treatment including medications, injection therapy, activity modification.     PROCEDURE:  Right total knee replacement. 2. Left knee intra-articular knee injection     COMPONENTS USED:  DePuy Attune rotating platform posterior stabilized knee   system, a size 4N femur, 4 tibia, size 8 mm PS AOX insert, and 35 anatomic patellar   button.      SURGEON:  Alexis Olson, M.D.      ASSISTANT:  Alexis Citron, PA-C.      ANESTHESIA:  Regional and Spinal.      SPECIMENS:  None.      COMPLICATION:  None.      DRAINS:  None.  EBL: <150cc      TOURNIQUET TIME:   Total Tourniquet Time Documented: Thigh (Right) - 28 minutes Total: Thigh (Right) - 28 minutes  .      The patient was stable to the recovery room.      INDICATION FOR PROCEDURE:  [name] is a 86 y.o. female patient of   mine.  The patient had been seen, evaluated, and treated for months conservatively in the   office with medication, activity modification, and injections.  The patient had   radiographic changes of bone-on-bone arthritis with endplate sclerosis and osteophytes noted.  Based on the radiographic changes and failed conservative measures, the patient   decided to proceed  with definitive treatment, total knee replacement.  Risks of infection, DVT, component failure, need for revision surgery, neurovascular injury were reviewed in the office setting.  The postop course was reviewed stressing the efforts to maximize post-operative satisfaction and function.  Consent was obtained for benefit of pain   relief.      PROCEDURE IN DETAIL:  The patient was brought to the operative theater.   Once adequate anesthesia, preoperative antibiotics, 2 gm of Ancef,1 gm of Tranexamic Acid, and 10 mg of Decadron administered, the patient was positioned supine with a right thigh tourniquet placed.  The  right lower extremity was prepped and draped in sterile fashion.  A time-   out was performed identifying the patient, planned procedure, and the appropriate extremity.      The right lower extremity was placed in the [facility] leg holder.  The leg was   exsanguinated, tourniquet elevated  to 250 mmHg.  A midline incision was   made followed by median parapatellar arthrotomy.  Following initial   exposure, attention was first directed to the patella.  Precut   measurement was noted to be 21 mm.  I resected down to 14 mm and used a   35 anatomic patellar button to restore patellar height as well as cover the cut surface.      The lug holes were drilled and a metal shim was placed to protect the   patella from retractors and saw blade during the procedure.      At this point, attention was now directed to the femur.  The femoral   canal was opened with a drill, irrigated to try to prevent fat emboli.  An   intramedullary rod was passed at 3 degrees valgus, 9 mm of bone was   resected off the distal femur.  Following this resection, the tibia was   subluxated anteriorly.  Using the extramedullary guide, 2 mm of bone was resected off   the proximal lateral tibia.  We confirmed the gap would be   stable medially and laterally with a size 6 spacer block as well as confirmed that the tibial  cut was perpendicular in the coronal plane, checking with an alignment rod.      Once this was done, I sized the femur to be a size 4 in the anterior-   posterior dimension, chose a narrow component based on medial and   lateral dimension.  The size 4 rotation block was then pinned in   position anterior referenced using the C-clamp to set rotation.  The   anterior, posterior, and  chamfer cuts were made without difficulty nor   notching making certain that I was along the anterior cortex to help   with flexion gap stability.      The final box cut was made off the lateral aspect of distal femur.      At this point, the tibia was sized to be a size 4.  The size 4 tray was   then pinned in position through the medial third of the tubercle,   drilled, and keel punched.  Trial reduction was now carried with a 4 femur,  4 tibia, a size 8 mm PS insert, and the 35 anatomic patella botton.  The knee was brought to full extension with good flexion stability with the patella   tracking through the trochlea without application of pressure.  Given   all these findings the trial components removed.  Final components were   opened and cement was mixed.  The knee was irrigated with normal saline solution and pulse lavage.  The synovial lining was   then injected with 30 cc of 0.25% Marcaine with epinephrine, 1 cc of Toradol and 30 cc of NS for a total of 61 cc.     Final implants were then cemented onto cleaned and dried cut surfaces of bone with the knee brought to extension with a size 8 mm PS trial insert.      Once the cement had fully cured, excess cement was removed   throughout the knee.  I confirmed that I was satisfied with the range of   motion and stability, and the final size 8 mm PS AOX insert was chosen.  It was   placed into the knee.      The tourniquet had been let down at 28 minutes.  No significant   hemostasis was required.  The extensor mechanism was then reapproximated using #1  Vicryl and #1 Stratafix sutures with the knee   in flexion.  The   remaining wound was closed with 2-0 Vicryl and running 4-0 Monocryl.   The knee was cleaned, dried, dressed sterilely using Dermabond and   Aquacel dressing.    Once the knee procedure was completed and dressed the left knee was cleansed laterally and injected with 80 mg of Depomedrol and Lidocaine. Bandaid applied  The patient was then   brought to recovery room in stable condition, tolerating the procedure   well.   Please note that Physician Assistant, Alexis Citron, PA-C was present for the entirety of the case, and was utilized for pre-operative positioning, peri-operative retractor management, general facilitation of the procedure and for primary wound closure at the end of the case.              Alexis Cassis Alvan Olson, M.D.    08/26/2019 8:43 AM

## 2019-08-27 ENCOUNTER — Encounter: Payer: Self-pay | Admitting: *Deleted

## 2019-08-27 DIAGNOSIS — M170 Bilateral primary osteoarthritis of knee: Secondary | ICD-10-CM | POA: Diagnosis present

## 2019-08-27 DIAGNOSIS — E785 Hyperlipidemia, unspecified: Secondary | ICD-10-CM | POA: Diagnosis present

## 2019-08-27 DIAGNOSIS — G8929 Other chronic pain: Secondary | ICD-10-CM | POA: Diagnosis present

## 2019-08-27 DIAGNOSIS — Z86718 Personal history of other venous thrombosis and embolism: Secondary | ICD-10-CM | POA: Diagnosis not present

## 2019-08-27 DIAGNOSIS — K219 Gastro-esophageal reflux disease without esophagitis: Secondary | ICD-10-CM | POA: Diagnosis present

## 2019-08-27 DIAGNOSIS — I10 Essential (primary) hypertension: Secondary | ICD-10-CM | POA: Diagnosis present

## 2019-08-27 DIAGNOSIS — Z9884 Bariatric surgery status: Secondary | ICD-10-CM | POA: Diagnosis not present

## 2019-08-27 DIAGNOSIS — Z6830 Body mass index (BMI) 30.0-30.9, adult: Secondary | ICD-10-CM | POA: Diagnosis not present

## 2019-08-27 DIAGNOSIS — Z20822 Contact with and (suspected) exposure to covid-19: Secondary | ICD-10-CM | POA: Diagnosis present

## 2019-08-27 DIAGNOSIS — E669 Obesity, unspecified: Secondary | ICD-10-CM | POA: Diagnosis present

## 2019-08-27 LAB — CBC
HCT: 28.6 % — ABNORMAL LOW (ref 36.0–46.0)
Hemoglobin: 9.2 g/dL — ABNORMAL LOW (ref 12.0–15.0)
MCH: 32.1 pg (ref 26.0–34.0)
MCHC: 32.2 g/dL (ref 30.0–36.0)
MCV: 99.7 fL (ref 80.0–100.0)
Platelets: 246 10*3/uL (ref 150–400)
RBC: 2.87 MIL/uL — ABNORMAL LOW (ref 3.87–5.11)
RDW: 13.8 % (ref 11.5–15.5)
WBC: 14.7 10*3/uL — ABNORMAL HIGH (ref 4.0–10.5)
nRBC: 0 % (ref 0.0–0.2)

## 2019-08-27 LAB — BASIC METABOLIC PANEL
Anion gap: 8 (ref 5–15)
BUN: 10 mg/dL (ref 8–23)
CO2: 27 mmol/L (ref 22–32)
Calcium: 8.1 mg/dL — ABNORMAL LOW (ref 8.9–10.3)
Chloride: 100 mmol/L (ref 98–111)
Creatinine, Ser: 0.53 mg/dL (ref 0.44–1.00)
GFR calc Af Amer: >60 mL/min (ref >60)
GFR calc non Af Amer: >60 mL/min (ref >60)
Glucose, Bld: 154 mg/dL — ABNORMAL HIGH (ref 70–99)
Potassium: 3.9 mmol/L (ref 3.5–5.1)
Sodium: 135 mmol/L (ref 135–145)

## 2019-08-27 MED ORDER — FERROUS SULFATE 325 (65 FE) MG PO TABS: 325.0000 mg | ORAL_TABLET | Freq: Three times a day (TID) | ORAL | 0 refills | Status: DC

## 2019-08-27 MED ORDER — ONDANSETRON 4 MG PO TBDP: 4.0000 mg | ORAL_TABLET | Freq: Three times a day (TID) | ORAL | 0 refills | Status: AC | PRN

## 2019-08-27 MED ORDER — OXYCODONE HCL 5 MG PO TABS: 5.0000 mg | ORAL_TABLET | ORAL | 0 refills | Status: DC | PRN

## 2019-08-27 MED ORDER — ASPIRIN 81 MG PO CHEW
81.0000 mg | CHEWABLE_TABLET | Freq: Two times a day (BID) | ORAL | 0 refills | Status: AC
Start: 2019-09-12 — End: 2019-10-12

## 2019-08-27 MED ORDER — RIVAROXABAN 10 MG PO TABS
10.0000 mg | ORAL_TABLET | Freq: Every day | ORAL | 0 refills | Status: DC
Start: 2019-08-28 — End: 2019-11-16

## 2019-08-27 MED ORDER — NITROFURANTOIN MONOHYD MACRO 100 MG PO CAPS
100.0000 mg | ORAL_CAPSULE | Freq: Two times a day (BID) | ORAL | Status: DC
  Administered 2019-08-27 – 2019-08-29 (×5): 100 mg via ORAL
  Filled 2019-08-27 (×6): qty 1

## 2019-08-27 MED ORDER — KETOROLAC TROMETHAMINE 15 MG/ML IJ SOLN
7.5000 mg | Freq: Four times a day (QID) | INTRAMUSCULAR | Status: AC
  Administered 2019-08-27 – 2019-08-28 (×4): 7.5 mg via INTRAVENOUS
  Filled 2019-08-27 (×4): qty 1

## 2019-08-27 MED ORDER — ACETAMINOPHEN 500 MG PO TABS: 1000.0000 mg | ORAL_TABLET | Freq: Three times a day (TID) | ORAL | 0 refills | Status: AC

## 2019-08-27 MED ORDER — METHOCARBAMOL 500 MG PO TABS: 500.0000 mg | ORAL_TABLET | Freq: Four times a day (QID) | ORAL | 0 refills | Status: DC | PRN

## 2019-08-27 MED ORDER — POLYETHYLENE GLYCOL 3350 17 G PO PACK
17.0000 g | PACK | Freq: Two times a day (BID) | ORAL | 0 refills | Status: DC
Start: 2019-08-27 — End: 2019-11-16

## 2019-08-27 MED ORDER — ACETAMINOPHEN 500 MG PO TABS
1000.0000 mg | ORAL_TABLET | Freq: Three times a day (TID) | ORAL | Status: DC
  Administered 2019-08-27 – 2019-08-29 (×8): 1000 mg via ORAL
  Filled 2019-08-27 (×8): qty 2

## 2019-08-27 MED ORDER — CHLORHEXIDINE GLUCONATE CLOTH 2 % EX PADS
6.0000 | MEDICATED_PAD | Freq: Every day | CUTANEOUS | Status: DC
  Administered 2019-08-29: 6 via TOPICAL

## 2019-08-27 MED ORDER — DOCUSATE SODIUM 100 MG PO CAPS: 100.0000 mg | ORAL_CAPSULE | Freq: Two times a day (BID) | ORAL | 0 refills | Status: DC

## 2019-08-27 MED FILL — ONDANSETRON ODT 4 MG TABLET: 4 | 7 days supply | Qty: 20 | Fill #0

## 2019-08-27 MED FILL — DOK 100 MG SOFTGEL: 100 | 14 days supply | Qty: 28 | Fill #0

## 2019-08-27 MED FILL — FERROUS SULFATE 325 MG TAB: 325 (65 FE) | 14 days supply | Qty: 42 | Fill #0

## 2019-08-27 MED FILL — ASPIRIN LOW DOSE 81 MG CHEW: 81 | 30 days supply | Qty: 60 | Fill #0

## 2019-08-27 MED FILL — METHOCARBAMOL 500 MG TABS: 500 | 10 days supply | Qty: 40 | Fill #0

## 2019-08-27 MED FILL — XARELTO 10 MG TABLET: 10 | 14 days supply | Qty: 14 | Fill #0

## 2019-08-27 MED FILL — POLYETHYLENE GLYCOL 3350 PO: 17 | 14 days supply | Qty: 476 | Fill #0

## 2019-08-27 MED FILL — oxyCODONE HCL 5 MG TABS: 5 | 10 days supply | Qty: 56 | Fill #0

## 2019-08-27 NOTE — Progress Notes (Signed)
Physical Therapy Treatment Patient Details Name: Alexis Olson MRN: [id number] DOB: 1934-03-09 Today's Date: 08/27/2019    History of Present Illness Patient is 86 y.o. female s/p Rt TKA on 08/26/19 with PMH significant for UTI's, obesity, osteomyelitis, pelvic fracture, HTN, HLD, GERD, hx of DVT, OA, asthma, anxiety, revascularization surgery to Rt LE.     PT Comments    Pt progressing toward goals although somewhat slowly. Reviewed gait/TKA HEP this pm and pt did very well with knee ROM. Continue PT in acute setting  Follow Up Recommendations  Follow surgeon's recommendation for DC plan and follow-up therapies     Equipment Recommendations  None recommended by PT(pt has RW and BSC)    Recommendations for Other Services       Precautions / Restrictions Precautions Precautions: Fall;Knee Restrictions Weight Bearing Restrictions: No Other Position/Activity Restrictions: WBAT    Mobility  Bed Mobility Overal bed mobility: Needs Assistance Bed Mobility: Sit to Supine     Supine to sit: HOB elevated;Min guard Sit to supine: Min assist   General bed mobility comments: assist with LEs, incr time cues for technique and useof gait belt as leg lifter   Transfers Overall transfer level: Needs assistance Equipment used: Rolling walker (2 wheeled) Transfers: Sit to/from Stand Sit to Stand: Min assist;From elevated surface         General transfer comment: cues for technique with RW and for power up. assist with anterior superior wt translation  Ambulation/Gait Ambulation/Gait assistance: Min guard;Min assist Gait Distance (Feet): 40 Feet Assistive device: Rolling walker (2 wheeled) Gait Pattern/deviations: Step-to pattern;Decreased step length - left;Decreased stance time - right;Decreased weight shift to right;Antalgic Gait velocity: decreased--significantly   General Gait Details: cues for proximity to RW and assist to manage with turn. pt required cues to look up to  improve posture.   Stairs             Wheelchair Mobility    Modified Rankin (Stroke Patients Only)       Balance Overall balance assessment: Needs assistance Sitting-balance support: Feet supported Sitting balance-Leahy Scale: Fair     Standing balance support: During functional activity;Bilateral upper extremity supported Standing balance-Leahy Scale: Poor                              Cognition Arousal/Alertness: Awake/alert Behavior During Therapy: WFL for tasks assessed/performed Overall Cognitive Status: Within Functional Limits for tasks assessed                                        Exercises Total Joint Exercises Ankle Circles/Pumps: AROM;Both;20 reps;Seated Quad Sets: AROM;Both;10 reps Heel Slides: AROM;AAROM;Right;10 reps Hip ABduction/ADduction: AROM;Right;10 reps Straight Leg Raises: AROM;AAROM;Right;10 reps Goniometric ROM: grossly 8 to 75 degrees right knee flexion    General Comments        Pertinent Vitals/Pain Pain Assessment: 0-10 Pain Score: 4  Pain Location: Rt knee Pain Descriptors / Indicators: Aching;Discomfort Pain Intervention(s): Limited activity within patient's tolerance;Monitored during session;Premedicated before session;Repositioned;Ice applied    Home Living                      Prior Function            PT Goals (current goals can now be found in the care plan section) Acute Rehab PT Goals Patient Stated Goal: stop  hurting PT Goal Formulation: With patient Time For Goal Achievement: 09/02/19 Potential to Achieve Goals: Good Progress towards PT goals: Progressing toward goals    Frequency    7X/week      PT Plan Current plan remains appropriate    Co-evaluation              AM-PAC PT "6 Clicks" Mobility   Outcome Measure  Help needed turning from your back to your side while in a flat bed without using bedrails?: A Little Help needed moving from lying on  your back to sitting on the side of a flat bed without using bedrails?: A Little Help needed moving to and from a bed to a chair (including a wheelchair)?: A Little Help needed standing up from a chair using your arms (e.g., wheelchair or bedside chair)?: A Little Help needed to walk in hospital room?: A Little Help needed climbing 3-5 steps with a railing? : A Lot 6 Click Score: 17    End of Session Equipment Utilized During Treatment: Gait belt Activity Tolerance: Patient tolerated treatment well Patient left: with call bell/phone within reach;in bed;with bed alarm set;with family/visitor present Nurse Communication: Mobility status PT Visit Diagnosis: Muscle weakness (generalized) (M62.81);Difficulty in walking, not elsewhere classified (R26.2)     Time: [id number] PT Time Calculation (min) (ACUTE ONLY): 26 min  Charges:  $Gait Training: 8-22 mins $Therapeutic Exercise: 8-22 mins                      [facility] 08/27/2019, 2:01 PM

## 2019-08-27 NOTE — Progress Notes (Signed)
Patient refused foley catheter removal.

## 2019-08-27 NOTE — Progress Notes (Signed)
Physical Therapy Treatment Patient Details Name: Alexis Olson MRN: [id number] DOB: Apr 15, 1933 Today's Date: 08/27/2019    History of Present Illness Patient is 86 y.o. female s/p Rt TKA on 08/26/19 with PMH significant for UTI's, obesity, osteomyelitis, pelvic fracture, HTN, HLD, GERD, hx of DVT, OA, asthma, anxiety, revascularization surgery to Rt LE.     PT Comments    Pt amb 20' with RW and min assist today. Fatigue and pain limiting activity level. Continue to follow. Pt planning for d/c tomorrow   Follow Up Recommendations  Follow surgeon's recommendation for DC plan and follow-up therapies     Equipment Recommendations  None recommended by PT(pt has RW and BSC)    Recommendations for Other Services       Precautions / Restrictions Precautions Precautions: Fall;Knee Restrictions Weight Bearing Restrictions: No Other Position/Activity Restrictions: WBAT    Mobility  Bed Mobility Overal bed mobility: Needs Assistance Bed Mobility: Supine to Sit     Supine to sit: HOB elevated;Min guard     General bed mobility comments: cues for use of bed rail to assist with raising trunk. assist required for Rt LE mobility and to scoot to EOB, excessive time required   Transfers Overall transfer level: Needs assistance Equipment used: Rolling walker (2 wheeled) Transfers: Sit to/from Stand Sit to Stand: Min assist;From elevated surface         General transfer comment: cues for technique with RW and for power up.   Ambulation/Gait Ambulation/Gait assistance: Min assist Gait Distance (Feet): 20 Feet Assistive device: Rolling walker (2 wheeled) Gait Pattern/deviations: Step-to pattern;Decreased step length - left;Decreased stance time - right;Decreased weight shift to right;Antalgic Gait velocity: decreased--significantly   General Gait Details: cues for proximity to RW and assist to manage with turn. pt required cues to look up to improve posture.   Stairs              Wheelchair Mobility    Modified Rankin (Stroke Patients Only)       Balance Overall balance assessment: Needs assistance Sitting-balance support: Feet supported Sitting balance-Leahy Scale: Fair     Standing balance support: During functional activity;Bilateral upper extremity supported Standing balance-Leahy Scale: Poor                              Cognition Arousal/Alertness: Awake/alert Behavior During Therapy: WFL for tasks assessed/performed Overall Cognitive Status: Within Functional Limits for tasks assessed                                        Exercises Total Joint Exercises Ankle Circles/Pumps: AROM;Both;20 reps;Seated Quad Sets: AROM;Both;10 reps    General Comments        Pertinent Vitals/Pain Pain Assessment: 0-10 Pain Score: 6  Pain Location: Rt knee Pain Descriptors / Indicators: Aching;Discomfort Pain Intervention(s): Limited activity within patient's tolerance;Monitored during session;Premedicated before session;Repositioned    Home Living                      Prior Function            PT Goals (current goals can now be found in the care plan section) Acute Rehab PT Goals Patient Stated Goal: stop hurting PT Goal Formulation: With patient Time For Goal Achievement: 09/02/19 Potential to Achieve Goals: Good Progress towards PT goals: Progressing toward goals  Frequency    7X/week      PT Plan Current plan remains appropriate    Co-evaluation              AM-PAC PT "6 Clicks" Mobility   Outcome Measure  Help needed turning from your back to your side while in a flat bed without using bedrails?: A Little Help needed moving from lying on your back to sitting on the side of a flat bed without using bedrails?: A Little Help needed moving to and from a bed to a chair (including a wheelchair)?: A Little Help needed standing up from a chair using your arms (e.g., wheelchair or bedside  chair)?: A Little Help needed to walk in hospital room?: A Little Help needed climbing 3-5 steps with a railing? : A Lot 6 Click Score: 17    End of Session Equipment Utilized During Treatment: Gait belt Activity Tolerance: Patient limited by pain;Patient limited by fatigue Patient left: in chair;with call bell/phone within reach;with chair alarm set Nurse Communication: Mobility status PT Visit Diagnosis: Muscle weakness (generalized) (M62.81);Difficulty in walking, not elsewhere classified (R26.2)     Time: 1020-1046 PT Time Calculation (min) (ACUTE ONLY): 26 min  Charges:  $Gait Training: 23-37 mins                     Epic Tribbett, PT   Acute Rehab Dept [facility]): [id number]   08/27/2019    [facility] 08/27/2019, 11:32 AM

## 2019-08-27 NOTE — Progress Notes (Signed)
Patient ID: Alexis Olson, female   DOB: Aug 13, 1933, 86 y.o.   MRN: [id number] Subjective: 1 Day Post-Op Procedure(s) (LRB): RIGHT TOTAL KNEE ARTHROPLASTY, LEFT KNEE CORTISONE INJECTION (Right)    Patient reports pain as moderate. Has challenges with pain meds and nausea in past. Issues with dilaudid last night now on oxycodone  Objective:   VITALS:   Vitals:   08/27/19 0140 08/27/19 0451  BP: 119/61 138/71  Pulse: 91 (!) 102  Resp: 16 16  Temp: 98.5 F (36.9 C) 98.2 F (36.8 C)  SpO2: 100% 95%    Neurovascular intact Incision: dressing C/D/I  LABS Recent Labs    08/27/19 0324  HGB 9.2*  HCT 28.6*  WBC 14.7*  PLT 246    Recent Labs    08/27/19 0324  NA 135  K 3.9  BUN 10  CREATININE 0.53  GLUCOSE 154*    No results for input(s): LABPT, INR in the last 72 hours.   Assessment/Plan: 1 Day Post-Op Procedure(s) (LRB): RIGHT TOTAL KNEE ARTHROPLASTY, LEFT KNEE CORTISONE INJECTION (Right)   Advance diet Up with therapy  Change/add meds: Zofran for nausea Tylenol q8 now that on oxycodone toradol to try and help with initial post op pain  She request based on history she provides to maintain her foley.  I reviewed concerns about indwelling catheters and infection risk but despite this she wishes to have it maintained Placed on macrobid as prophylaxis

## 2019-08-28 LAB — CBC
HCT: 26.3 % — ABNORMAL LOW (ref 36.0–46.0)
Hemoglobin: 8.5 g/dL — ABNORMAL LOW (ref 12.0–15.0)
MCH: 32.1 pg (ref 26.0–34.0)
MCHC: 32.3 g/dL (ref 30.0–36.0)
MCV: 99.2 fL (ref 80.0–100.0)
Platelets: 210 10*3/uL (ref 150–400)
RBC: 2.65 MIL/uL — ABNORMAL LOW (ref 3.87–5.11)
RDW: 13.8 % (ref 11.5–15.5)
WBC: 12.5 10*3/uL — ABNORMAL HIGH (ref 4.0–10.5)
nRBC: 0 % (ref 0.0–0.2)

## 2019-08-28 LAB — BASIC METABOLIC PANEL
Anion gap: 8 (ref 5–15)
BUN: 13 mg/dL (ref 8–23)
CO2: 27 mmol/L (ref 22–32)
Calcium: 8.4 mg/dL — ABNORMAL LOW (ref 8.9–10.3)
Chloride: 98 mmol/L (ref 98–111)
Creatinine, Ser: 0.36 mg/dL — ABNORMAL LOW (ref 0.44–1.00)
GFR calc Af Amer: >60 mL/min (ref >60)
GFR calc non Af Amer: >60 mL/min (ref >60)
Glucose, Bld: 114 mg/dL — ABNORMAL HIGH (ref 70–99)
Potassium: 4.3 mmol/L (ref 3.5–5.1)
Sodium: 133 mmol/L — ABNORMAL LOW (ref 135–145)

## 2019-08-28 MED ORDER — CEPHALEXIN 500 MG PO CAPS: 500.0000 mg | ORAL_CAPSULE | Freq: Three times a day (TID) | ORAL | 0 refills | Status: AC

## 2019-08-28 MED ORDER — KETOROLAC TROMETHAMINE 15 MG/ML IJ SOLN
7.5000 mg | Freq: Four times a day (QID) | INTRAMUSCULAR | Status: AC | PRN
  Administered 2019-08-28: 7.5 mg via INTRAVENOUS
  Filled 2019-08-28: qty 1

## 2019-08-28 MED FILL — CEPHALEXIN 500 MG CAPSULE: 500 | 5 days supply | Qty: 15 | Fill #0

## 2019-08-28 NOTE — TOC Initial Note (Signed)
Transition of Care [facility]) - Initial/Assessment Note    Patient Details  Name: Alexis Olson MRN: [id number] Date of Birth: 08/17/1933  [facility] CM/SW Contact:    Leeroy Cha, RN Phone Number: 08/28/2019, 7:47 AM  Clinical Narrative:                 S/p right tka Plan: hep and has equipment needed per pa and md notes.  Expected Discharge Plan: Home/Self Care Barriers to Discharge: No Barriers Identified   Patient Goals and CMS Choice Patient states their goals for this hospitalization and ongoing recovery are:: to go home and do my excercises CMS Medicare.gov Compare Post Acute Care list provided to:: Patient    Expected Discharge Plan and Services Expected Discharge Plan: Home/Self Care   Discharge Planning Services: CM Consult   Living arrangements for the past 2 months: Single Family Home Expected Discharge Date: 08/27/19                                    Prior Living Arrangements/Services Living arrangements for the past 2 months: Single Family Home Lives with:: Spouse Patient language and need for interpreter reviewed:: No Do you feel safe going back to the place where you live?: Yes      Need for Family Participation in Patient Care: Yes (Comment) Care giver support system in place?: Yes (comment)   Criminal Activity/Legal Involvement Pertinent to Current Situation/Hospitalization: No - Comment as needed  Activities of Daily Living Home Assistive Devices/Equipment: Eyeglasses, Bedside commode/3-in-1, Cane (specify quad or straight) ADL Screening (condition at time of admission) Patient's cognitive ability adequate to safely complete daily activities?: Yes Is the patient deaf or have difficulty hearing?: No Does the patient have difficulty seeing, even when wearing glasses/contacts?: No Does the patient have difficulty concentrating, remembering, or making decisions?: No Patient able to express need for assistance with ADLs?:  Yes Does the patient have difficulty dressing or bathing?: No Independently performs ADLs?: Yes (appropriate for developmental age) Does the patient have difficulty walking or climbing stairs?: No Weakness of Legs: Right Weakness of Arms/Hands: None  Permission Sought/Granted                  Emotional Assessment Appearance:: Appears stated age     Orientation: : Oriented to Self, Oriented to Place, Oriented to  Time, Oriented to Situation Alcohol / Substance Use: Not Applicable Psych Involvement: No (comment)  Admission diagnosis:  Status post total right knee replacement [Z96.651] Patient Active Problem List   Diagnosis Date Noted  . S/P right TKA 08/26/2019  . Ventral hernia without obstruction or gangrene 05/22/2018  . [place] filter in place 03/08/2017  . Pain of right lower extremity 03/08/2017  . Preventative health care 02/13/2017  . Hyponatremia 05/02/2016  . GERD (gastroesophageal reflux disease) 08/23/2015  . HSV (herpes simplex virus) infection 08/16/2015  . Hyperlipidemia, mild 08/16/2015  . Osteoporosis   . Chronic diarrhea   . Anemia   . OSTEOMYELITIS, VERTEBRA 05/05/2009  . [place] DISEASE, LUMBOSACRAL SPINE 04/22/2009  . Vitamin B 12 deficiency 07/09/2008  . DIVERTICULOSIS, COLON 07/09/2008  . COLONIC POLYPS, ADENOMATOUS, HX OF 07/09/2008  . Anxiety state 08/08/2007  . INSOMNIA 08/08/2007  . Bariatric surgery status 08/08/2007  . Midline back pain 10/30/2006  . ALLERGIC RHINITIS 09/18/2006   PCP:  Mosie Lukes, MD Pharmacy:   [place], [place] -  [address] [place] [address] Phone: [phone] Fax: [phone]  [id number] - [place], [place] [address] Phone: [phone] Fax: [phone]  [place], [place]  [address] Phone: [phone] Fax: [phone]     Social Determinants of Health (SDOH) Interventions    Readmission Risk Interventions No flowsheet data found.

## 2019-08-28 NOTE — Progress Notes (Signed)
Physical Therapy Treatment Patient Details Name: Alexis Olson MRN: [id number] DOB: 01/01/1934 Today's Date: 08/28/2019    History of Present Illness Patient is 86 y.o. female s/p Rt TKA on 08/26/19 with PMH significant for UTI's, obesity, osteomyelitis, pelvic fracture, HTN, HLD, GERD, hx of DVT, OA, asthma, anxiety, revascularization surgery to Rt LE.     PT Comments    Pt assisted with ambulating and practiced steps.  Pt currently requiring min assist for mobility at this time. Pt and family do not yet feel comfortable with d/c home today.    Follow Up Recommendations  Follow surgeon's recommendation for DC plan and follow-up therapies     Equipment Recommendations  None recommended by PT    Recommendations for Other Services       Precautions / Restrictions Precautions Precautions: Fall;Knee Restrictions Other Position/Activity Restrictions: WBAT    Mobility  Bed Mobility Overal bed mobility: Needs Assistance Bed Mobility: Sit to Supine     Supine to sit: Min assist Sit to supine: Min assist   General bed mobility comments: assist for R LE, increased time and effort  Transfers Overall transfer level: Needs assistance Equipment used: Rolling walker (2 wheeled) Transfers: Sit to/from Stand Sit to Stand: Min assist         General transfer comment: verbal cues for technique and anterior superior weight transition, assist to rise and steady, control descent  Ambulation/Gait Ambulation/Gait assistance: Min assist Gait Distance (Feet): 28 Feet Assistive device: Rolling walker (2 wheeled) Gait Pattern/deviations: Step-to pattern;Decreased step length - left;Decreased stance time - right;Decreased weight shift to right;Antalgic Gait velocity: decreased   General Gait Details: verbal cues for sequence, RW positioning and posture   Stairs Stairs: Yes Stairs assistance: Min assist Stair Management: Step to pattern;Forwards;One rail Left Number of Stairs:  3 General stair comments: verbal cues for hand placement and sequencing; pt typically uses SPC so provided HHA, pt declined backwards technique with RW, min assist due to pain   Wheelchair Mobility    Modified Rankin (Stroke Patients Only)       Balance                                            Cognition Arousal/Alertness: Awake/alert Behavior During Therapy: WFL for tasks assessed/performed Overall Cognitive Status: Within Functional Limits for tasks assessed                                        Exercises Total Joint Exercises Ankle Circles/Pumps: AROM;Both;20 reps;Seated Quad Sets: AROM;Both;10 reps Short Arc QuadSinclair Olson;Right;10 reps Heel Slides: AAROM;Right;10 reps;Seated    General Comments        Pertinent Vitals/Pain Pain Assessment: 0-10 Pain Score: 6  Pain Location: Rt knee Pain Descriptors / Indicators: Aching;Discomfort;Sore Pain Intervention(s): Repositioned;Monitored during session;Patient requesting pain meds-RN notified    Home Living                      Prior Function            PT Goals (current goals can now be found in the care plan section) Progress towards PT goals: Progressing toward goals    Frequency    7X/week      PT Plan Current plan remains appropriate    Co-evaluation  AM-PAC PT "6 Clicks" Mobility   Outcome Measure  Help needed turning from your back to your side while in a flat bed without using bedrails?: A Little Help needed moving from lying on your back to sitting on the side of a flat bed without using bedrails?: A Little Help needed moving to and from a bed to a chair (including a wheelchair)?: A Little Help needed standing up from a chair using your arms (e.g., wheelchair or bedside chair)?: A Little Help needed to walk in hospital room?: A Little Help needed climbing 3-5 steps with a railing? : A Little 6 Click Score: 18    End of Session  Equipment Utilized During Treatment: Gait belt Activity Tolerance: Patient tolerated treatment well Patient left: with call bell/phone within reach;in bed;with family/visitor present Nurse Communication: Mobility status PT Visit Diagnosis: Muscle weakness (generalized) (M62.81);Difficulty in walking, not elsewhere classified (R26.2)     Time: [id number] PT Time Calculation (min) (ACUTE ONLY): 26 min  Charges:  $Gait Training: 23-37 mins                     Alexis Olson, DPT Acute Rehabilitation Services Office: [phone]  Alexis Olson 08/28/2019, 3:57 PM

## 2019-08-28 NOTE — Progress Notes (Signed)
Physical Therapy Treatment Patient Details Name: Alexis Olson MRN: [id number] DOB: 1933/04/05 Today's Date: 08/28/2019    History of Present Illness Patient is 86 y.o. female s/p Rt TKA on 08/26/19 with PMH significant for UTI's, obesity, osteomyelitis, pelvic fracture, HTN, HLD, GERD, hx of DVT, OA, asthma, anxiety, revascularization surgery to Rt LE.     PT Comments    Pt ambulated short distance in hallway and performed a few exercises upon return to recliner.  Pt with increased difficulty performing sit to stands.  Will return to practice steps this afternoon.    Follow Up Recommendations  Follow surgeon's recommendation for DC plan and follow-up therapies     Equipment Recommendations  None recommended by PT    Recommendations for Other Services       Precautions / Restrictions Precautions Precautions: Fall;Knee Restrictions Other Position/Activity Restrictions: WBAT    Mobility  Bed Mobility Overal bed mobility: Needs Assistance Bed Mobility: Supine to Sit     Supine to sit: Min assist     General bed mobility comments: assist for R LE, increased time and effort  Transfers Overall transfer level: Needs assistance Equipment used: Rolling walker (2 wheeled) Transfers: Sit to/from Stand Sit to Stand: Min assist         General transfer comment: verbal cues for technique and anterior superior weight transition, assist to rise and steady  Ambulation/Gait Ambulation/Gait assistance: Min assist Gait Distance (Feet): 28 Feet Assistive device: Rolling walker (2 wheeled) Gait Pattern/deviations: Step-to pattern;Decreased step length - left;Decreased stance time - right;Decreased weight shift to right;Antalgic Gait velocity: decreased   General Gait Details: verbal cues for sequence, RW positioning and posture   Stairs             Wheelchair Mobility    Modified Rankin (Stroke Patients Only)       Balance                                            Cognition Arousal/Alertness: Awake/alert Behavior During Therapy: WFL for tasks assessed/performed Overall Cognitive Status: Within Functional Limits for tasks assessed                                        Exercises Total Joint Exercises Ankle Circles/Pumps: AROM;Both;20 reps;Seated Quad Sets: AROM;Both;10 reps Short Arc QuadSinclair Ship;Right;10 reps Heel Slides: AAROM;Right;10 reps;Seated    General Comments        Pertinent Vitals/Pain Pain Assessment: 0-10 Pain Score: 5  Pain Location: Rt knee Pain Descriptors / Indicators: Aching;Discomfort Pain Intervention(s): Monitored during session;Repositioned    Home Living                      Prior Function            PT Goals (current goals can now be found in the care plan section) Progress towards PT goals: Progressing toward goals    Frequency    7X/week      PT Plan Current plan remains appropriate    Co-evaluation              AM-PAC PT "6 Clicks" Mobility   Outcome Measure  Help needed turning from your back to your side while in a flat bed without using bedrails?: A Little Help  needed moving from lying on your back to sitting on the side of a flat bed without using bedrails?: A Little Help needed moving to and from a bed to a chair (including a wheelchair)?: A Lot Help needed standing up from a chair using your arms (e.g., wheelchair or bedside chair)?: A Lot Help needed to walk in hospital room?: A Little Help needed climbing 3-5 steps with a railing? : A Lot 6 Click Score: 15    End of Session Equipment Utilized During Treatment: Gait belt Activity Tolerance: Patient tolerated treatment well Patient left: with call bell/phone within reach;with family/visitor present;in chair   PT Visit Diagnosis: Muscle weakness (generalized) (M62.81);Difficulty in walking, not elsewhere classified (R26.2)     Time: [id number] PT Time Calculation (min) (ACUTE  ONLY): 25 min  Charges:  $Gait Training: 8-22 mins $Therapeutic Exercise: 8-22 mins                    [name], DPT Acute Rehabilitation Services Office: [phone]   York Ram E 08/28/2019, 3:52 PM

## 2019-08-28 NOTE — Progress Notes (Signed)
Subjective: 2 Days Post-Op Procedure(s) (LRB): RIGHT TOTAL KNEE ARTHROPLASTY, LEFT KNEE CORTISONE INJECTION (Right) Patient reports pain as mild.   Patient seen in rounds for Dr. Alvan Dame. Patient is well, and has had no acute complaints or problems other than discomfort in the right knee. No acute events overnight. She is resting in bed this morning with family at bedside. Foley catheter in place, +bowel movement yesterday. She ambulated 40 feet with PT yesterday. Her daughter has concerns about her being at home with just her husband when she discharges.  Plan is to go Home after hospital stay.  Objective: Vital signs in last 24 hours: Temp:  [97.7 F (36.5 C)-99.1 F (37.3 C)] 98 F (36.7 C) (06/03 0515) Pulse Rate:  [93-96] 96 (06/03 0515) Resp:  [14-17] 14 (06/03 0515) BP: (128-145)/(62-73) 128/62 (06/03 0515) SpO2:  [93 %-98 %] 94 % (06/03 0515)  Intake/Output from previous day:  Intake/Output Summary (Last 24 hours) at 08/28/2019 0926 Last data filed at 08/28/2019 0600 Gross per 24 hour  Intake 935.72 ml  Output 3100 ml  Net -2164.28 ml    Intake/Output this shift: No intake/output data recorded.  Labs: Recent Labs    08/27/19 0324 08/28/19 0255  HGB 9.2* 8.5*   Recent Labs    08/27/19 0324 08/28/19 0255  WBC 14.7* 12.5*  RBC 2.87* 2.65*  HCT 28.6* 26.3*  PLT 246 210   Recent Labs    08/27/19 0324 08/28/19 0255  NA 135 133*  K 3.9 4.3  CL 100 98  CO2 27 27  BUN 10 13  CREATININE 0.53 0.36*  GLUCOSE 154* 114*  CALCIUM 8.1* 8.4*   No results for input(s): LABPT, INR in the last 72 hours.  Exam: General - Patient is Alert and Oriented Extremity - Neurologically intact Sensation intact distally Intact pulses distally Dorsiflexion/Plantar flexion intact Dressing/Incision - clean, dry, no drainage Motor Function - intact, moving foot and toes well on exam.   Past Medical History:  Diagnosis Date  . ALLERGIC RHINITIS   . 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 ANXIETY   . Arthritis   . ASTHMA   . Bariatric surgery status   . Bowel perforation ([place]) 2016  . C. difficile diarrhea   . Colon perforation ([place]) 08/2014   following polypetomy during colo, surgical repair  . COLONIC POLYPS, ADENOMATOUS, HX OF 2010  . DIVERTICULOSIS, COLON   . DVT (deep venous thrombosis) ([place]) 2018  . DVT (deep venous thrombosis) ([place])   . GERD (gastroesophageal reflux disease) 08/23/2015  . Graded compression stocking in place   . History of blood transfusion   . History of migraine   . History of rheumatic fever   . HSV (herpes simplex virus) infection 08/16/2015  . Hyperlipidemia   . Hypertension    Denies  . Hyponatremia 05/02/2016  . Incomplete left bundle branch block (LBBB) 2016   Noted on EKG  . INSOMNIA   . Lumbosacral disc disease    Chronic pain; History of osteomyelitis 2010 following ESI complication  . Obesity   . Osteomyelitis ([place])   . Pelvic fracture ([place])   . Pneumonia   . Recurrent UTI 08/23/2015  . URINARY URGENCY   . Ventral hernia without obstruction or gangrene 05/22/2018  . VITAMIN B12 DEFICIENCY     Assessment/Plan: 2 Days Post-Op Procedure(s) (LRB): RIGHT TOTAL KNEE ARTHROPLASTY, LEFT KNEE CORTISONE INJECTION (Right) Principal Problem:   S/P right TKA  Estimated body mass index is 30.82 kg/m as calculated from the following:   Height as of this encounter: 5' 5.5" (1.664 m).   Weight as of this encounter: 85.3 kg. Advance diet Up with therapy  DVT Prophylaxis - Xarelto Weight-bearing as tolerated  Plan for possible discharge today vs tomorrow depending on progression with PT. She will continue working with PT today towards safe discharge home. Her family does have concerns about her being home with just her husband, although she does have family close by. We will see how she progresses today with mobility.  We discussed her foley catheter today, and I do  recommend we discontinue this this AM. She was previously discharged home with a foley due to a complication during surgery. She has had incontinence after d/c foley catheters in the past, but I reassured her family this is relatively common and it would be easier to address this while she is still here. They are in agreement. She has had frequent UTIs, and does see a urologist. Keflex has been the best antibiotic for her in the past, and we will send her home with this rather than Macrobid.   Griffith Citron, PA-C Orthopedic Surgery [phone] 08/28/2019, 9:26 AM

## 2019-08-29 ENCOUNTER — Telehealth: Payer: Self-pay | Admitting: *Deleted

## 2019-08-29 NOTE — Progress Notes (Signed)
Subjective: 3 Days Post-Op Procedure(s) (LRB): RIGHT TOTAL KNEE ARTHROPLASTY, LEFT KNEE CORTISONE INJECTION (Right) Patient reports pain as mild.   Patient seen in rounds for Dr. Alvan Dame. Patient is well, and has had no acute complaints or problems. No acute events overnight. Denies CP, SHOB, N/V. Voiding without difficulty, positive flatus. Ambulated 28 feet with PT yesterday.  We will continue  therapy today.   Objective: Vital signs in last 24 hours: Temp:  [98.4 F (36.9 C)-99 F (37.2 C)] 99 F (37.2 C) (06/04 0513) Pulse Rate:  [75-99] 75 (06/04 0513) Resp:  [14-16] 14 (06/04 0513) BP: (105-123)/(53-63) 105/59 (06/04 0513) SpO2:  [96 %-97 %] 97 % (06/04 0754)  Intake/Output from previous day:  Intake/Output Summary (Last 24 hours) at 08/29/2019 1100 Last data filed at 08/29/2019 1034 Gross per 24 hour  Intake 930 ml  Output 2700 ml  Net -1770 ml     Intake/Output this shift: Total I/O In: 240 [P.O.:240] Out: 700 [Urine:700]  Labs: Recent Labs    08/27/19 0324 08/28/19 0255  HGB 9.2* 8.5*   Recent Labs    08/27/19 0324 08/28/19 0255  WBC 14.7* 12.5*  RBC 2.87* 2.65*  HCT 28.6* 26.3*  PLT 246 210   Recent Labs    08/27/19 0324 08/28/19 0255  NA 135 133*  K 3.9 4.3  CL 100 98  CO2 27 27  BUN 10 13  CREATININE 0.53 0.36*  GLUCOSE 154* 114*  CALCIUM 8.1* 8.4*   No results for input(s): LABPT, INR in the last 72 hours.  Exam: General - Patient is Alert and Oriented Extremity - Neurologically intact Sensation intact distally Intact pulses distally Dorsiflexion/Plantar flexion intact Dressing - dressing C/D/I Motor Function - intact, moving foot and toes well on exam.   Past Medical History:  Diagnosis Date   ALLERGIC RHINITIS    ANEMIA, CHRONIC    Malabsorption related to bypass hx, B12 and iron deficiency   ANXIETY    Arthritis    ASTHMA    Bariatric surgery status    Bowel perforation ([place]) 2016   C. difficile diarrhea    Colon  perforation ([place]) 08/2014   following polypetomy during colo, surgical repair   COLONIC POLYPS, ADENOMATOUS, HX OF 2010   DIVERTICULOSIS, COLON    DVT (deep venous thrombosis) ([place]) 2018   DVT (deep venous thrombosis) ([facility])    GERD (gastroesophageal reflux disease) 08/23/2015   Graded compression stocking in place    History of blood transfusion    History of migraine    History of rheumatic fever    HSV (herpes simplex virus) infection 08/16/2015   Hyperlipidemia    Hypertension    Denies   Hyponatremia 05/02/2016   Incomplete left bundle branch block (LBBB) 2016   Noted on EKG   INSOMNIA    Lumbosacral disc disease    Chronic pain; History of osteomyelitis 2010 following ESI complication   Obesity    Osteomyelitis ([facility])    Pelvic fracture ([facility])    Pneumonia    Recurrent UTI 08/23/2015   URINARY URGENCY    Ventral hernia without obstruction or gangrene 05/22/2018   VITAMIN B12 DEFICIENCY     Assessment/Plan: 3 Days Post-Op Procedure(s) (LRB): RIGHT TOTAL KNEE ARTHROPLASTY, LEFT KNEE CORTISONE INJECTION (Right) Principal Problem:   S/P right TKA  Estimated body mass index is 30.82 kg/m as calculated from the following:   Height as of this encounter: 5' 5.5" (1.664 m).   Weight as of this encounter:  85.3 kg. Advance diet Up with therapy D/C IV fluids  Anticipated LOS equal to or greater than 2 midnights due to - Age 59 and older with one or more of the following:  - Obesity  - Expected need for hospital services (PT, OT, Nursing) required for safe  discharge  - Anticipated need for postoperative skilled nursing care or inpatient rehab  - Active co-morbidities: None OR   - Unanticipated findings during/Post Surgery: None  - Patient is a high risk of re-admission due to: None   DVT Prophylaxis - Aspirin Weight bearing as tolerated.  Plan is to go Home after hospital stay. Plan for likely discharge today following 1-2 sessions of therapy as long as she is meeting her  goals. She will follow up in the office in 2 weeks.   Griffith Citron, PA-C Orthopedic Surgery [phone] 08/29/2019, 11:00 AM

## 2019-08-29 NOTE — Progress Notes (Signed)
Physical Therapy Treatment Patient Details Name: Alexis Olson MRN: [id number] DOB: Dec 05, 1933 Today's Date: 08/29/2019    History of Present Illness Patient is 86 y.o. female s/p Rt TKA on 08/26/19 with PMH significant for UTI's, obesity, osteomyelitis, pelvic fracture, HTN, HLD, GERD, hx of DVT, OA, asthma, anxiety, revascularization surgery to Rt LE.     PT Comments    Worked on bed mobility from flat bed. Pt able to perform supine to sit with supervision, incr time and cues for completion.  Pt performed stand pivot to [facility] with close supervision to min-guard, slow but steady. Requested to be left alone on BSC to void.    Follow Up Recommendations  Follow surgeon's recommendation for DC plan and follow-up therapies     Equipment Recommendations  None recommended by PT    Recommendations for Other Services       Precautions / Restrictions Precautions Precautions: Fall;Knee Restrictions Weight Bearing Restrictions: No Other Position/Activity Restrictions: WBAT    Mobility  Bed Mobility Overal bed mobility: Needs Assistance Bed Mobility: Supine to Sit     Supine to sit: Supervision     General bed mobility comments: incr time, bed flat, cues to complete task   Transfers Overall transfer level: Needs assistance Equipment used: Rolling walker (2 wheeled) Transfers: Sit to/from [facility] Sit to Stand: Min guard Stand pivot transfers: Min guard;Supervision       General transfer comment: verbal cues for technique and anterior superior weight transition, rise and steady, control descent  Ambulation/Gait             General Gait Details: (pivotal steps only to [facility])   Stairs             Wheelchair Mobility    Modified Rankin (Stroke Patients Only)       Balance                                            Cognition Arousal/Alertness: Awake/alert Behavior During Therapy: WFL for tasks assessed/performed Overall  Cognitive Status: Within Functional Limits for tasks assessed                                 General Comments: requires redirection to current task       Exercises      General Comments        Pertinent Vitals/Pain Pain Assessment: 0-10 Pain Score: 4  Pain Location: Rt knee Pain Descriptors / Indicators: Aching;Discomfort;Sore Pain Intervention(s): Limited activity within patient's tolerance;Monitored during session;Premedicated before session;Repositioned    Home Living                      Prior Function            PT Goals (current goals can now be found in the care plan section) Acute Rehab PT Goals Patient Stated Goal: stop hurting PT Goal Formulation: With patient Time For Goal Achievement: 09/02/19 Potential to Achieve Goals: Good Progress towards PT goals: Progressing toward goals    Frequency    7X/week      PT Plan Current plan remains appropriate    Co-evaluation              AM-PAC PT "6 Clicks" Mobility   Outcome Measure  Help needed turning from your  back to your side while in a flat bed without using bedrails?: A Little Help needed moving from lying on your back to sitting on the side of a flat bed without using bedrails?: None Help needed moving to and from a bed to a chair (including a wheelchair)?: A Little Help needed standing up from a chair using your arms (e.g., wheelchair or bedside chair)?: A Little Help needed to walk in hospital room?: A Little Help needed climbing 3-5 steps with a railing? : A Little 6 Click Score: 19    End of Session Equipment Utilized During Treatment: Gait belt Activity Tolerance: Patient tolerated treatment well Patient left: Other (comment);with family/visitor present(BSC, left at pt request) Nurse Communication: Mobility status PT Visit Diagnosis: Muscle weakness (generalized) (M62.81);Difficulty in walking, not elsewhere classified (R26.2)     Time: [id number] PT Time  Calculation (min) (ACUTE ONLY): 13 min  Charges:  $Therapeutic Activity: 8-22 mins                     Baxter Flattery, PT   Acute Rehab Dept [facility]): [phone]   08/29/2019    [facility] 08/29/2019, 10:28 AM

## 2019-08-29 NOTE — Care Management Important Message (Signed)
Important Message  Patient Details  Name: Alexis Olson MRN: [id number] Date of Birth: 1933/10/31   Medicare Important Message Given:     Document given to Velva Harman, NCM  to give to the patient.    Crista Luria 08/29/2019, 10:36 AM

## 2019-08-29 NOTE — Progress Notes (Signed)
08/29/19 1104  PT Visit Information  Last PT Received On 08/29/19  Pt progressing well today. Feeling much better overall.  Reviewed stairs/gait and TKA HEP. Pt is ready for d/c home from PT standpoint (with family assist prn)  Assistance Needed +1  History of Present Illness Patient is 86 y.o. female s/p Rt TKA on 08/26/19 with PMH significant for UTI's, obesity, osteomyelitis, pelvic fracture, HTN, HLD, GERD, hx of DVT, OA, asthma, anxiety, revascularization surgery to Rt LE.   Subjective Data  Patient Stated Goal stop hurting  Precautions  Precautions Fall;Knee  Restrictions  Weight Bearing Restrictions No  Other Position/Activity Restrictions WBAT  Pain Assessment  Pain Assessment 0-10  Pain Score 4  Pain Location Rt knee  Pain Descriptors / Indicators Aching;Discomfort;Sore  Pain Intervention(s) Limited activity within patient's tolerance;Monitored during session  Cognition  Arousal/Alertness Awake/alert  Behavior During Therapy WFL for tasks assessed/performed  Overall Cognitive Status Within Functional Limits for tasks assessed  General Comments requires redirection to current task   Bed Mobility  General bed mobility comments in chair   Transfers  Overall transfer level Needs assistance  Equipment used Rolling walker (2 wheeled)  Transfers Sit to/from Stand;Stand Pivot Transfers  Sit to Stand Supervision  General transfer comment verbal cues for technique and anterior superior weight transition, rise and steady, control descent  Ambulation/Gait  Ambulation/Gait assistance Min guard;Supervision  Gait Distance (Feet) 40 Feet  Assistive device Rolling walker (2 wheeled)  Gait Pattern/deviations Step-to pattern;Decreased step length - left;Decreased stance time - right;Decreased weight shift to right  General Gait Details verbal cues for sequence, RW positioning and posture (pivotal steps only to [facility])  Gait velocity decreased  Stairs assistance Min guard  Stair  Management One rail Left;Step to pattern;With cane  Number of Stairs 2  General stair comments cues for sequence and technique   Total Joint Exercises  Ankle Circles/Pumps AROM;Both;20 reps;Seated  Quad Sets AROM;Both;10 reps  Heel Slides AAROM;Right;10 reps  Straight Leg Raises AROM;AAROM;Right;10 reps  PT - End of Session  Equipment Utilized During Treatment Gait belt  Activity Tolerance Patient tolerated treatment well  Patient left with family/visitor present;in chair;with chair alarm set  Nurse Communication Mobility status   PT - Assessment/Plan  PT Plan Current plan remains appropriate  PT Visit Diagnosis Muscle weakness (generalized) (M62.81);Difficulty in walking, not elsewhere classified (R26.2)  PT Frequency (ACUTE ONLY) 7X/week  Follow Up Recommendations Follow surgeon's recommendation for DC plan and follow-up therapies  PT equipment None recommended by PT  AM-PAC PT "6 Clicks" Mobility Outcome Measure (Version 2)  Help needed turning from your back to your side while in a flat bed without using bedrails? 3  Help needed moving from lying on your back to sitting on the side of a flat bed without using bedrails? 4  Help needed moving to and from a bed to a chair (including a wheelchair)? 3  Help needed standing up from a chair using your arms (e.g., wheelchair or bedside chair)? 3  Help needed to walk in hospital room? 3  Help needed climbing 3-5 steps with a railing?  3  6 Click Score 19  Consider Recommendation of Discharge To: Home with [facility]  PT Goal Progression  Progress towards PT goals Progressing toward goals  Acute Rehab PT Goals  PT Goal Formulation With patient  Time For Goal Achievement 09/02/19  Potential to Achieve Goals Good  PT Time Calculation  PT Start Time (ACUTE ONLY) 1031  PT Stop Time (ACUTE ONLY)  1101  PT Time Calculation (min) (ACUTE ONLY) 30 min  PT General Charges  $$ ACUTE PT VISIT 1 Visit  PT Treatments  $Gait Training 8-22 mins   $Therapeutic Exercise 8-22 mins

## 2019-08-29 NOTE — Telephone Encounter (Signed)
Daughter called and was concerned that mother could be coming down with something since father was just diagnosed with pneumonia.  He did not have any symptoms and now she is hoarse and now concerned that she should maybe could have it to since they where together before she went in to have her knee surgery. Patient is being discharged today.  Advised daughter that if she is concern then she should take her to an urgent care to be evaluated.

## 2019-09-02 ENCOUNTER — Other Ambulatory Visit: Payer: Self-pay | Admitting: Internal Medicine

## 2019-09-03 NOTE — Discharge Summary (Signed)
Physician Discharge Summary   Patient ID: Alexis Olson MRN: [id number] DOB/AGE: 1933/07/16 86 y.o.  Admit date: 08/26/2019 Discharge date: 08/29/2019  Primary Diagnosis: 1. Right knee osteoarthritis. 2. Left knee osteoarthritis  Admission Diagnoses:  Past Medical History:  Diagnosis Date  . ALLERGIC RHINITIS   . ANEMIA, CHRONIC    Malabsorption related to bypass hx, B12 and iron deficiency  . ANXIETY   . Arthritis   . ASTHMA   . Bariatric surgery status   . Bowel perforation ([place]) 2016  . C. difficile diarrhea   . Colon perforation ([place]) 08/2014   following polypetomy during colo, surgical repair  . COLONIC POLYPS, ADENOMATOUS, HX OF 2010  . DIVERTICULOSIS, COLON   . DVT (deep venous thrombosis) ([place]) 2018  . DVT (deep venous thrombosis) ([place])   . GERD (gastroesophageal reflux disease) 08/23/2015  . Graded compression stocking in place   . History of blood transfusion   . History of migraine   . History of rheumatic fever   . HSV (herpes simplex virus) infection 08/16/2015  . Hyperlipidemia   . Hypertension    Denies  . Hyponatremia 05/02/2016  . Incomplete left bundle branch block (LBBB) 2016   Noted on EKG  . INSOMNIA   . Lumbosacral disc disease    Chronic pain; History of osteomyelitis 2010 following ESI complication  . Obesity   . Osteomyelitis ([place])   . Pelvic fracture ([place])   . Pneumonia   . Recurrent UTI 08/23/2015  . URINARY URGENCY   . Ventral hernia without obstruction or gangrene 05/22/2018  . VITAMIN B12 DEFICIENCY    Discharge Diagnoses:   Principal Problem:   S/P right TKA  Estimated body mass index is 30.82 kg/m as calculated from the following:   Height as of this encounter: 5' 5.5" (1.664 m).   Weight as of this encounter: 85.3 kg.  Procedure:  Procedure(s) (LRB): RIGHT TOTAL KNEE ARTHROPLASTY, LEFT KNEE CORTISONE INJECTION (Right)   Consults: None  HPI: Alexis Olson is a 86 y.o. female patient of   mine.  The patient had been seen,  evaluated, and treated for months conservatively in the office with medication, activity modification, and injections.  The patient had  radiographic changes of bone-on-bone arthritis with endplate sclerosis and osteophytes noted.  Based on the radiographic changes and failed conservative measures, the patient decided to proceed with definitive treatment, total knee replacement.  Risks of infection, DVT, component failure, need for revision surgery, neurovascular injury were reviewed in the office setting.  The postop course was reviewed stressing the efforts to maximize post-operative satisfaction and function.  Consent was obtained for benefit of pain   relief.   Laboratory Data: Admission on 08/26/2019, Discharged on 08/29/2019  Component Date Value Ref Range Status  . WBC 08/27/2019 14.7* 4.0 - 10.5 K/uL Final  . RBC 08/27/2019 2.87* 3.87 - 5.11 MIL/uL Final  . Hemoglobin 08/27/2019 9.2* 12.0 - 15.0 g/dL Final  . HCT 08/27/2019 28.6* 36.0 - 46.0 % Final  . MCV 08/27/2019 99.7  80.0 - 100.0 fL Final  . MCH 08/27/2019 32.1  26.0 - 34.0 pg Final  . MCHC 08/27/2019 32.2  30.0 - 36.0 g/dL Final  . RDW 08/27/2019 13.8  11.5 - 15.5 % Final  . Platelets 08/27/2019 246  150 - 400 K/uL Final  . nRBC 08/27/2019 0.0  0.0 - 0.2 % Final   Performed at [facility], [place] [address], [place], [place] [address]  . Sodium 08/27/2019  135  135 - 145 mmol/L Final  . Potassium 08/27/2019 3.9  3.5 - 5.1 mmol/L Final  . Chloride 08/27/2019 100  98 - 111 mmol/L Final  . CO2 08/27/2019 27  22 - 32 mmol/L Final  . Glucose, Bld 08/27/2019 154* 70 - 99 mg/dL Final   Glucose reference range applies only to samples taken after fasting for at least 8 hours.  . BUN 08/27/2019 10  8 - 23 mg/dL Final  . Creatinine, Ser 08/27/2019 0.53  0.44 - 1.00 mg/dL Final  . Calcium 08/27/2019 8.1* 8.9 - 10.3 mg/dL Final  . GFR calc non Af Amer 08/27/2019 >60  >60 mL/min Final  . GFR calc Af Amer 08/27/2019 >60   >60 mL/min Final  . Anion gap 08/27/2019 8  5 - 15 Final   Performed at [facility], [place] [address] [place], [place] [address]  . WBC 08/28/2019 12.5* 4.0 - 10.5 K/uL Final  . RBC 08/28/2019 2.65* 3.87 - 5.11 MIL/uL Final  . Hemoglobin 08/28/2019 8.5* 12.0 - 15.0 g/dL Final  . HCT 08/28/2019 26.3* 36.0 - 46.0 % Final  . MCV 08/28/2019 99.2  80.0 - 100.0 fL Final  . MCH 08/28/2019 32.1  26.0 - 34.0 pg Final  . MCHC 08/28/2019 32.3  30.0 - 36.0 g/dL Final  . RDW 08/28/2019 13.8  11.5 - 15.5 % Final  . Platelets 08/28/2019 210  150 - 400 K/uL Final  . nRBC 08/28/2019 0.0  0.0 - 0.2 % Final   Performed at [facility], [place] [address], [place], [place] [address]  . Sodium 08/28/2019 133* 135 - 145 mmol/L Final  . Potassium 08/28/2019 4.3  3.5 - 5.1 mmol/L Final  . Chloride 08/28/2019 98  98 - 111 mmol/L Final  . CO2 08/28/2019 27  22 - 32 mmol/L Final  . Glucose, Bld 08/28/2019 114* 70 - 99 mg/dL Final   Glucose reference range applies only to samples taken after fasting for at least 8 hours.  . BUN 08/28/2019 13  8 - 23 mg/dL Final  . Creatinine, Ser 08/28/2019 0.36* 0.44 - 1.00 mg/dL Final  . Calcium 08/28/2019 8.4* 8.9 - 10.3 mg/dL Final  . GFR calc non Af Amer 08/28/2019 >60  >60 mL/min Final  . GFR calc Af Amer 08/28/2019 >60  >60 mL/min Final  . Anion gap 08/28/2019 8  5 - 15 Final   Performed at [facility], [place] [address] [place], [place] [address]  Hospital Outpatient Visit on 08/22/2019  Component Date Value Ref Range Status  . SARS Coronavirus 2 08/22/2019 NEGATIVE  NEGATIVE Final   Comment: (NOTE) SARS-CoV-2 target nucleic acids are NOT DETECTED. The SARS-CoV-2 RNA is generally detectable in upper and lower respiratory specimens during the acute phase of infection. Negative results do not preclude SARS-CoV-2 infection, do not rule out co-infections with other pathogens, and should not be used as the sole basis for  treatment or other patient management decisions. Negative results must be combined with clinical observations, patient history, and epidemiological information. The expected result is Negative. Fact Sheet for Patients: SugarRoll.be Fact Sheet for Healthcare Providers: https://www.woods-mathews.com/ This test is not yet approved or cleared by the [place] FDA and  has been authorized for detection and/or diagnosis of SARS-CoV-2 by FDA under an Emergency Use Authorization (EUA). This EUA will remain  in effect (meaning this test can be used) for the duration of the COVID-19 declaration under Section 56  4(b)(1) of the Act, 21 U.S.C. section 360bbb-3(b)(1), unless the authorization is terminated or revoked sooner. Performed at [facility], [place] [address] [place], [place] [address]   Hospital Outpatient Visit on 08/18/2019  Component Date Value Ref Range Status  . MRSA, PCR 08/18/2019 NEGATIVE  NEGATIVE Final  . Staphylococcus aureus 08/18/2019 NEGATIVE  NEGATIVE Final   Comment: (NOTE) The Xpert SA Assay (FDA approved for NASAL specimens in patients 1 years of age and older), is one component of a comprehensive surveillance program. It is not intended to diagnose infection nor to guide or monitor treatment. Performed at [facility], [place] [address] [place], [place] [address]   . ABO/RH(D) 08/18/2019 A NEG   Final  . Antibody Screen 08/18/2019 NEG   Final  . Sample Expiration 08/18/2019 08/29/2019,2359   Final  . Extend sample reason 08/18/2019    Final                   Value:NO TRANSFUSIONS OR PREGNANCY IN THE PAST 3 MONTHS Performed at [place] [address], [place], [place] [address]   . Sodium 08/18/2019 130* 135 - 145 mmol/L Final  . Potassium 08/18/2019 4.2  3.5 - 5.1 mmol/L Final  . Chloride 08/18/2019 96* 98 - 111 mmol/L Final  . CO2 08/18/2019 26   22 - 32 mmol/L Final  . Glucose, Bld 08/18/2019 88  70 - 99 mg/dL Final   Glucose reference range applies only to samples taken after fasting for at least 8 hours.  . BUN 08/18/2019 15  8 - 23 mg/dL Final  . Creatinine, Ser 08/18/2019 0.46  0.44 - 1.00 mg/dL Final  . Calcium 08/18/2019 8.7* 8.9 - 10.3 mg/dL Final  . GFR calc non Af Amer 08/18/2019 >60  >60 mL/min Final  . GFR calc Af Amer 08/18/2019 >60  >60 mL/min Final  . Anion gap 08/18/2019 8  5 - 15 Final   Performed at [facility], [place] [address], [place], [place] [address]  . WBC 08/18/2019 7.2  4.0 - 10.5 K/uL Final  . RBC 08/18/2019 3.55* 3.87 - 5.11 MIL/uL Final  . Hemoglobin 08/18/2019 11.3* 12.0 - 15.0 g/dL Final  . HCT 08/18/2019 34.9* 36.0 - 46.0 % Final  . MCV 08/18/2019 98.3  80.0 - 100.0 fL Final  . MCH 08/18/2019 31.8  26.0 - 34.0 pg Final  . MCHC 08/18/2019 32.4  30.0 - 36.0 g/dL Final  . RDW 08/18/2019 13.4  11.5 - 15.5 % Final  . Platelets 08/18/2019 259  150 - 400 K/uL Final  . nRBC 08/18/2019 0.0  0.0 - 0.2 % Final   Performed at [facility], [place] [address], [place], [place] [address]  . ABO/RH(D) 08/18/2019    Final                   Value:A NEG Performed at [facility], [place] [address], [place],  [address]      X-Rays:No results found.  EKG: Orders placed or performed during the hospital encounter of 08/18/19  . EKG 12 lead  . EKG 12 lead     Hospital Course: Alexis Olson is a 86 y.o. who was admitted to [facility]. They were brought to the operating room on 08/26/2019 and underwent Procedure(s): RIGHT TOTAL KNEE ARTHROPLASTY, LEFT KNEE CORTISONE INJECTION.  Patient tolerated the procedure well and was later transferred to the recovery room and then  to the orthopaedic floor for postoperative care. They were given PO and IV analgesics for pain control following their surgery. They were given 24 hours of postoperative antibiotics of    Anti-infectives (From admission, onward)   Start     Dose/Rate Route Frequency Ordered Stop   08/28/19 0000  cephALEXin (KEFLEX) 500 MG capsule     500 mg Oral 3 times daily 08/28/19 0934 09/02/19 2359   08/27/19 1000  nitrofurantoin (macrocrystal-monohydrate) (MACROBID) capsule 100 mg  Status:  Discontinued     100 mg Oral Every 12 hours 08/27/19 0737 08/29/19 2326   08/26/19 1400  ceFAZolin (ANCEF) IVPB 2g/100 mL premix     2 g 200 mL/hr over 30 Minutes Intravenous Every 6 hours 08/26/19 1020 08/26/19 2321   08/26/19 1042  valACYclovir (VALTREX) tablet 500 mg  Status:  Discontinued     500 mg Oral 2 times daily PRN 08/26/19 1020 08/29/19 2326   08/26/19 0630  ceFAZolin (ANCEF) IVPB 2g/100 mL premix     2 g 200 mL/hr over 30 Minutes Intravenous On call to O.R. 08/26/19 0160 08/26/19 0753     and started on DVT prophylaxis in the form of Xarelto.   PT and OT were ordered for total joint protocol. Discharge planning consulted to help with postop disposition and equipment needs. Patient had a fair night on the evening of surgery. They started to get up OOB with therapy on POD #0. She did have nausea with Dilaudid and was switched to Oxycodone with improvement. Worked with therapy on POD #1. Continued to work with therapy into POD #2. Foley catheter removed on POD #2. Patient progressed slowly with PT secondary to discomfort in the knee. Pt was seen during rounds on day three and was ready to go home pending progress with therapy. Pt worked with therapy for one additional session and was meeting their goals. She was discharged to home later that day in stable condition.  Diet: Regular diet Activity: WBAT Follow-up: in 2 weeks Disposition: Home Discharged Condition: good   Discharge Instructions    Call MD / Call 911   Complete by: As directed    If you experience chest pain or shortness of breath, CALL 911 and be transported to the hospital emergency room.  If you develope a fever above 101  F, pus (white drainage) or increased drainage or redness at the wound, or calf pain, call your surgeon's office.   Change dressing   Complete by: As directed    Maintain surgical dressing until follow up in the clinic. If the edges start to pull up, may reinforce with tape. If the dressing is no longer working, may remove and cover with gauze and tape, but must keep the area dry and clean.  Call with any questions or concerns.   Constipation Prevention   Complete by: As directed    Drink plenty of fluids.  Prune juice may be helpful.  You may use a stool softener, such as Colace (over the counter) 100 mg twice a day.  Use MiraLax (over the counter) for constipation as needed.   Diet - low sodium heart healthy   Complete by: As directed    Discharge instructions   Complete by: As directed    Maintain surgical dressing until follow up in the clinic. If the edges start to pull up, may reinforce with tape. If the dressing is no longer working, may remove and cover with gauze and tape, but must keep the area dry  and clean.  Follow up in 2 weeks at [facility]. Call with any questions or concerns.   Increase activity slowly as tolerated   Complete by: As directed    Weight bearing as tolerated with assist device (walker, cane, etc) as directed, use it as long as suggested by your surgeon or therapist, typically at least 4-6 weeks.   TED hose   Complete by: As directed    Use stockings (TED hose) for 2 weeks on both leg(s).  You may remove them at night for sleeping.     Allergies as of 08/29/2019      Reactions   Ativan [lorazepam] Anaphylaxis, Other (See Comments)   Deathly allergic; "resp rate dropped dropped to 6"   Other Anaphylaxis   Red peppers--choking, also   Azithromycin Diarrhea   Benadryl [diphenhydramine Hcl] Other (See Comments)   Paradoxical response   Ciprofloxacin Hcl Other (See Comments)   Causes yeast infection and refuses to take   Codeine Phosphate Nausea Only   Can  tolerate in limited amounts   Hydrocodone Itching, Nausea And Vomiting   Hydrocodone-homatropine Itching, Nausea And Vomiting   Levaquin [levofloxacin In D5w] Other (See Comments)   Muscle soreness   Levofloxacin    Meperidine Hcl Other (See Comments)   Reaction ??   Molds & Smuts Other (See Comments)   unsure   Sulfa Antibiotics Other (See Comments)   Joint pain   Tape Other (See Comments)   No Coban wrap (per the patient)   Ultram [tramadol Hcl] Other (See Comments)   "Can't move joints"      Medication List    STOP taking these medications   acetaminophen-codeine 300-30 MG tablet Commonly known as: TYLENOL #3   alendronate 70 MG tablet Commonly known as: FOSAMAX   cephALEXin 500 MG capsule Commonly known as: KEFLEX   diclofenac sodium 1 % Gel Commonly known as: VOLTAREN   meloxicam 15 MG tablet Commonly known as: MOBIC   polyethylene glycol powder 17 GM/SCOOP powder Commonly known as: MiraLax Replaced by: polyethylene glycol 17 g packet   PX Enteric Aspirin 81 MG EC tablet Generic drug: aspirin Replaced by: aspirin 81 MG chewable tablet     TAKE these medications   acetaminophen 500 MG tablet Commonly known as: TYLENOL Take 2 tablets (1,000 mg total) by mouth every 8 (eight) hours.   albuterol 108 (90 Base) MCG/ACT inhaler Commonly known as: VENTOLIN HFA Inhale 1-2 puffs into the lungs every 6 (six) hours as needed for wheezing or shortness of breath.   ALPRAZolam 1 MG tablet Commonly known as: XANAX TAKE 1/2 TO 1 TABLET BY MOUTH AT BEDTIME AS NEEDED What changed:   how much to take  reasons to take this   Amitiza 24 MCG capsule Generic drug: lubiprostone Take 24 mcg by mouth daily as needed for constipation.   amitriptyline 100 MG tablet Commonly known as: ELAVIL TAKE 1 TABLET BY MOUTH DAILY. What changed: when to take this   aspirin 81 MG chewable tablet Commonly known as: Aspirin Childrens Chew 1 tablet (81 mg total) by mouth 2 (two)  times daily. Start the day after finishing the Xarelto.  Take for 4 weeks, then resume regular dose. Start taking on: September 12, 2019 Replaces: PX Enteric Aspirin 81 MG EC tablet   azelastine 0.05 % ophthalmic solution Commonly known as: OPTIVAR Place 1 drop into both eyes daily as needed (for dry eyes).   benzonatate 100 MG capsule Commonly known as: TESSALON Take 1 capsule (  100 mg total) by mouth 3 (three) times daily as needed for cough.   budesonide-formoterol 160-4.5 MCG/ACT inhaler Commonly known as: SYMBICORT Inhale 2 puffs into the lungs See admin instructions. Inhale 2 puffs into the lungs one to two times a day What changed: when to take this   cetirizine 10 MG tablet Commonly known as: ZYRTEC Take 10 mg by mouth daily.   cyanocobalamin 1000 MCG/ML injection Commonly known as: (VITAMIN B-12) Inject 1cc into the muscle every 3 months   dicyclomine 10 MG capsule Commonly known as: BENTYL Take 1 capsule (10 mg total) by mouth 3 (three) times daily as needed (for diarrhea & symptoms).   diphenoxylate-atropine 2.5-0.025 MG tablet Commonly known as: Lomotil Take 2 tablets by mouth 3 (three) times daily as needed for diarrhea or loose stools.   docusate sodium 100 MG capsule Commonly known as: Colace Take 1 capsule (100 mg total) by mouth 2 (two) times daily.   ferrous sulfate 325 (65 FE) MG tablet Commonly known as: FerrouSul Take 1 tablet (325 mg total) by mouth 3 (three) times daily with meals for 14 days.   fluticasone 50 MCG/ACT nasal spray Commonly known as: FLONASE 2 sprays by Nasal route daily.   gabapentin 300 MG capsule Commonly known as: NEURONTIN TAKE 1 CAPSULE BY MOUTH AT BEDTIME   hydrOXYzine 25 MG tablet Commonly known as: ATARAX/VISTARIL Take 25 mg by mouth 3 (three) times daily as needed for itching.   levocetirizine 5 MG tablet Commonly known as: XYZAL Take 1 tablet (5 mg total) by mouth every evening.   Mag-Ox 400 400 MG tablet Generic  drug: magnesium oxide Take 400 mg by mouth 2 (two) times daily.   methocarbamol 500 MG tablet Commonly known as: Robaxin Take 1 tablet (500 mg total) by mouth every 6 (six) hours as needed for muscle spasms. What changed: when to take this   montelukast 10 MG tablet Commonly known as: SINGULAIR Take 10 mg by mouth at bedtime.   omeprazole 20 MG capsule Commonly known as: PRILOSEC TAKE 1 CAPSULE BY MOUTH DAILY.   ondansetron 4 MG disintegrating tablet Commonly known as: ZOFRAN-ODT Take 1 tablet (4 mg total) by mouth 3 (three) times daily as needed for nausea or vomiting.   oxyCODONE 5 MG immediate release tablet Commonly known as: Oxy IR/ROXICODONE Take 1-2 tablets (5-10 mg total) by mouth every 4 (four) hours as needed for moderate pain or severe pain.   phenazopyridine 200 MG tablet Commonly known as: PYRIDIUM Take 200 mg by mouth 3 (three) times daily as needed for pain.   polyethylene glycol 17 g packet Commonly known as: MIRALAX / GLYCOLAX Take 17 g by mouth 2 (two) times daily. Replaces: polyethylene glycol powder 17 GM/SCOOP powder   potassium chloride SA 20 MEQ tablet Commonly known as: KLOR-CON TAKE 1 TABLET BY MOUTH ONCE DAILY   PROBIOTIC DAILY PO Take 1 capsule by mouth daily.   rivaroxaban 10 MG Tabs tablet Commonly known as: Xarelto Take 1 tablet (10 mg total) by mouth daily for 14 days.   valACYclovir 500 MG tablet Commonly known as: VALTREX TAKE 1 TABLET (500 MG TOTAL) BY MOUTH AS NEEDED.   Vitamin D (Ergocalciferol) 1.25 MG (50000 UNIT) Caps capsule Commonly known as: DRISDOL Take 1 capsule (50,000 Units total) by mouth every 7 (seven) days.   vitamin E 180 MG (400 UNITS) capsule Take 400 Units by mouth daily.   zolpidem 5 MG tablet Commonly known as: AMBIEN TAKE 1 TABLET BY MOUTH  AT BEDTIME.     ASK your doctor about these medications   cephALEXin 500 MG capsule Commonly known as: KEFLEX Take 1 capsule (500 mg total) by mouth 3 (three)  times daily for 5 days. Ask about: Should I take this medication?            Discharge Care Instructions  (From admission, onward)         Start     Ordered   08/27/19 0000  Change dressing    Comments: Maintain surgical dressing until follow up in the clinic. If the edges start to pull up, may reinforce with tape. If the dressing is no longer working, may remove and cover with gauze and tape, but must keep the area dry and clean.  Call with any questions or concerns.   08/27/19 0754         Follow-up Information    Paralee Cancel, MD. Schedule an appointment as soon as possible for a visit in 2 weeks.   Specialty: Orthopedic Surgery Contact information: [address] [place] [address] [phone]           Signed: Griffith Citron, PA-C Orthopedic Surgery 09/03/2019, 8:06 AM

## 2019-09-04 DIAGNOSIS — J45909 Unspecified asthma, uncomplicated: Secondary | ICD-10-CM | POA: Diagnosis not present

## 2019-09-04 DIAGNOSIS — Z86718 Personal history of other venous thrombosis and embolism: Secondary | ICD-10-CM | POA: Diagnosis not present

## 2019-09-04 DIAGNOSIS — Z85830 Personal history of malignant neoplasm of bone: Secondary | ICD-10-CM | POA: Diagnosis not present

## 2019-09-04 DIAGNOSIS — K219 Gastro-esophageal reflux disease without esophagitis: Secondary | ICD-10-CM | POA: Diagnosis not present

## 2019-09-04 DIAGNOSIS — Z7901 Long term (current) use of anticoagulants: Secondary | ICD-10-CM | POA: Diagnosis not present

## 2019-09-04 DIAGNOSIS — Z9181 History of falling: Secondary | ICD-10-CM | POA: Diagnosis not present

## 2019-09-04 DIAGNOSIS — M5116 Intervertebral disc disorders with radiculopathy, lumbar region: Secondary | ICD-10-CM | POA: Diagnosis not present

## 2019-09-04 DIAGNOSIS — Z7951 Long term (current) use of inhaled steroids: Secondary | ICD-10-CM | POA: Diagnosis not present

## 2019-09-04 DIAGNOSIS — F419 Anxiety disorder, unspecified: Secondary | ICD-10-CM | POA: Diagnosis not present

## 2019-09-04 DIAGNOSIS — Z471 Aftercare following joint replacement surgery: Secondary | ICD-10-CM | POA: Diagnosis not present

## 2019-09-04 DIAGNOSIS — K589 Irritable bowel syndrome without diarrhea: Secondary | ICD-10-CM | POA: Diagnosis not present

## 2019-09-04 DIAGNOSIS — Z7982 Long term (current) use of aspirin: Secondary | ICD-10-CM | POA: Diagnosis not present

## 2019-09-04 DIAGNOSIS — G4730 Sleep apnea, unspecified: Secondary | ICD-10-CM | POA: Diagnosis not present

## 2019-09-04 DIAGNOSIS — Z981 Arthrodesis status: Secondary | ICD-10-CM | POA: Diagnosis not present

## 2019-09-04 DIAGNOSIS — Z96651 Presence of right artificial knee joint: Secondary | ICD-10-CM | POA: Diagnosis not present

## 2019-09-04 DIAGNOSIS — F329 Major depressive disorder, single episode, unspecified: Secondary | ICD-10-CM | POA: Diagnosis not present

## 2019-09-04 DIAGNOSIS — D649 Anemia, unspecified: Secondary | ICD-10-CM | POA: Diagnosis not present

## 2019-09-10 MED FILL — oxyCODONE HCL 5 MG TABS: 5 | 7 days supply | Qty: 40 | Fill #0

## 2019-09-10 MED FILL — hydrOXYzine HCL 25 MG TABS: 25 | 10 days supply | Qty: 30 | Fill #0

## 2019-09-11 ENCOUNTER — Other Ambulatory Visit: Payer: Self-pay | Admitting: Family Medicine

## 2019-09-11 DIAGNOSIS — Z96651 Presence of right artificial knee joint: Secondary | ICD-10-CM | POA: Diagnosis not present

## 2019-09-11 DIAGNOSIS — Z85830 Personal history of malignant neoplasm of bone: Secondary | ICD-10-CM | POA: Diagnosis not present

## 2019-09-11 DIAGNOSIS — Z7951 Long term (current) use of inhaled steroids: Secondary | ICD-10-CM | POA: Diagnosis not present

## 2019-09-11 DIAGNOSIS — F419 Anxiety disorder, unspecified: Secondary | ICD-10-CM | POA: Diagnosis not present

## 2019-09-11 DIAGNOSIS — F329 Major depressive disorder, single episode, unspecified: Secondary | ICD-10-CM | POA: Diagnosis not present

## 2019-09-11 DIAGNOSIS — Z7901 Long term (current) use of anticoagulants: Secondary | ICD-10-CM | POA: Diagnosis not present

## 2019-09-11 DIAGNOSIS — Z7982 Long term (current) use of aspirin: Secondary | ICD-10-CM | POA: Diagnosis not present

## 2019-09-11 DIAGNOSIS — D649 Anemia, unspecified: Secondary | ICD-10-CM | POA: Diagnosis not present

## 2019-09-11 DIAGNOSIS — M5116 Intervertebral disc disorders with radiculopathy, lumbar region: Secondary | ICD-10-CM | POA: Diagnosis not present

## 2019-09-11 DIAGNOSIS — Z86718 Personal history of other venous thrombosis and embolism: Secondary | ICD-10-CM | POA: Diagnosis not present

## 2019-09-11 DIAGNOSIS — K589 Irritable bowel syndrome without diarrhea: Secondary | ICD-10-CM | POA: Diagnosis not present

## 2019-09-11 DIAGNOSIS — G4730 Sleep apnea, unspecified: Secondary | ICD-10-CM | POA: Diagnosis not present

## 2019-09-11 DIAGNOSIS — J45909 Unspecified asthma, uncomplicated: Secondary | ICD-10-CM | POA: Diagnosis not present

## 2019-09-11 DIAGNOSIS — K219 Gastro-esophageal reflux disease without esophagitis: Secondary | ICD-10-CM | POA: Diagnosis not present

## 2019-09-11 DIAGNOSIS — Z981 Arthrodesis status: Secondary | ICD-10-CM | POA: Diagnosis not present

## 2019-09-11 DIAGNOSIS — Z471 Aftercare following joint replacement surgery: Secondary | ICD-10-CM | POA: Diagnosis not present

## 2019-09-11 DIAGNOSIS — Z9181 History of falling: Secondary | ICD-10-CM | POA: Diagnosis not present

## 2019-09-12 MED FILL — POTASSIUM CHLORIDE CRYS ER: 20 | 90 days supply | Qty: 90 | Fill #0

## 2019-09-12 NOTE — Telephone Encounter (Signed)
Patient called in to get a prescription refill for potassium chloride SA (KLOR-CON) 20 MEQ tablet [[id number]]    Please send it to [place], [place] - [address]  [place], [place] [address]  Phone:  [phone] Fax:  [phone]

## 2019-09-13 ENCOUNTER — Other Ambulatory Visit: Payer: Self-pay | Admitting: Family Medicine

## 2019-09-15 ENCOUNTER — Other Ambulatory Visit: Payer: Self-pay | Admitting: Family Medicine

## 2019-09-17 ENCOUNTER — Telehealth: Payer: Self-pay | Admitting: Family Medicine

## 2019-09-17 ENCOUNTER — Other Ambulatory Visit: Payer: Self-pay | Admitting: Internal Medicine

## 2019-09-17 ENCOUNTER — Other Ambulatory Visit: Payer: Self-pay | Admitting: *Deleted

## 2019-09-17 ENCOUNTER — Other Ambulatory Visit: Payer: Self-pay | Admitting: Family Medicine

## 2019-09-17 DIAGNOSIS — D62 Acute posthemorrhagic anemia: Secondary | ICD-10-CM

## 2019-09-17 MED ORDER — AMITRIPTYLINE HCL 100 MG PO TABS: 100.0000 mg | ORAL_TABLET | Freq: Every day | ORAL | 1 refills | Status: DC

## 2019-09-17 MED FILL — AMITRIPTYLINE HCL 100 MG TA: 100 | 90 days supply | Qty: 90 | Fill #0

## 2019-09-17 MED FILL — OMEPRAZOLE 20 MG CAP: 20 | 90 days supply | Qty: 90 | Fill #1

## 2019-09-17 MED FILL — DIPHENOXYLATE-ATROPINE 2.5-: 2.5-0.025 | 25 days supply | Qty: 150 | Fill #0

## 2019-09-17 NOTE — Telephone Encounter (Signed)
Patient is requesting for an order to check her blood due to her hemoglobin being 6.2 3 weeks ago when she was in the hospital. Please advise if order can be placed. Thanks

## 2019-09-17 NOTE — Telephone Encounter (Signed)
Refills sent on 09/15/2019.   amitriptyline (ELAVIL) 100 MG tablet 90 tablet 1 09/15/2019    Sig: TAKE 1 TABLET BY MOUTH DAILY.   Sent to pharmacy as: amitriptyline (ELAVIL) 100 MG tablet   E-Prescribing Status: Receipt confirmed by pharmacy (09/15/2019 3:13 PM EDT)

## 2019-09-17 NOTE — Telephone Encounter (Signed)
Sure- it was 8.5 lowest but will order CBC for her

## 2019-09-17 NOTE — Telephone Encounter (Signed)
Medication: amitriptyline (ELAVIL) 100 MG tablet   Has the patient contacted their pharmacy? No. (If no, request that the patient contact the pharmacy for the refill.) (If yes, when and what did the pharmacy advise?)  Preferred Pharmacy (with phone number or street name): [place], [place] - [address]  [place], [place] [address]  Phone:  [phone] Fax:  [phone]  DEA #:  --  Agent: Please be advised that RX refills may take up to 3 business days. We ask that you follow-up with your pharmacy.

## 2019-09-19 ENCOUNTER — Emergency Department (HOSPITAL_BASED_OUTPATIENT_CLINIC_OR_DEPARTMENT_OTHER)
Admission: EM | Admit: 2019-09-19 | Discharge: 2019-09-19 | Disposition: A | Discharge status: Home / Self Care | Payer: PPO | Attending: Emergency Medicine | Admitting: Emergency Medicine

## 2019-09-19 ENCOUNTER — Telehealth: Payer: Self-pay

## 2019-09-19 ENCOUNTER — Encounter (HOSPITAL_BASED_OUTPATIENT_CLINIC_OR_DEPARTMENT_OTHER): Payer: Self-pay | Admitting: *Deleted

## 2019-09-19 ENCOUNTER — Other Ambulatory Visit: Payer: Self-pay

## 2019-09-19 ENCOUNTER — Ambulatory Visit: Payer: PPO | Admitting: Medical

## 2019-09-19 DIAGNOSIS — I10 Essential (primary) hypertension: Secondary | ICD-10-CM | POA: Diagnosis not present

## 2019-09-19 DIAGNOSIS — J45909 Unspecified asthma, uncomplicated: Secondary | ICD-10-CM | POA: Diagnosis not present

## 2019-09-19 DIAGNOSIS — N3000 Acute cystitis without hematuria: Secondary | ICD-10-CM | POA: Diagnosis not present

## 2019-09-19 DIAGNOSIS — Z79899 Other long term (current) drug therapy: Secondary | ICD-10-CM | POA: Insufficient documentation

## 2019-09-19 DIAGNOSIS — Z7901 Long term (current) use of anticoagulants: Secondary | ICD-10-CM | POA: Diagnosis not present

## 2019-09-19 DIAGNOSIS — Z7982 Long term (current) use of aspirin: Secondary | ICD-10-CM | POA: Diagnosis not present

## 2019-09-19 DIAGNOSIS — Z7951 Long term (current) use of inhaled steroids: Secondary | ICD-10-CM | POA: Diagnosis not present

## 2019-09-19 DIAGNOSIS — R300 Dysuria: Secondary | ICD-10-CM

## 2019-09-19 DIAGNOSIS — Z86718 Personal history of other venous thrombosis and embolism: Secondary | ICD-10-CM | POA: Diagnosis not present

## 2019-09-19 LAB — URINALYSIS, MICROSCOPIC (REFLEX): WBC, UA: >50 WBC/hpf (ref 0–5)

## 2019-09-19 LAB — URINALYSIS, ROUTINE W REFLEX MICROSCOPIC
Bilirubin Urine: NEGATIVE
Glucose, UA: NEGATIVE mg/dL
Ketones, ur: NEGATIVE mg/dL
Nitrite: POSITIVE — AB
Protein, ur: 30 mg/dL — AB
Specific Gravity, Urine: <1.005 — ABNORMAL LOW (ref 1.005–1.030)
pH: 6 (ref 5.0–8.0)

## 2019-09-19 MED ORDER — CEPHALEXIN 500 MG PO CAPS
500.0000 mg | ORAL_CAPSULE | Freq: Two times a day (BID) | ORAL | 0 refills | Status: AC
Start: 2019-09-19 — End: 2019-09-26

## 2019-09-19 MED FILL — CEPHALEXIN 500 MG CAPSULE: 500 | 7 days supply | Qty: 14 | Fill #0

## 2019-09-19 NOTE — ED Triage Notes (Signed)
She is here dysuria off and on since last night. Hx of right knee replacement 3 weeks ago.

## 2019-09-19 NOTE — Telephone Encounter (Signed)
Pt called office at 3:40 pm to let office know she was on her way to her appt which she thought was at 3:45 pm.  I let pt know her appt time was at 3:20 pm today and Percell Miller was full for the rest of the afternoon.  Pt was insistent that she was still coming and she would be seen by provider.  She stated the person that scheduled the appt lied to her and could not speak [place].  I stated to the pt again, the provider was full for the rest of the day and would not be able to see her.  Also, knowing the acute need the pt was coming in for, our lab closes at 4 pm and there would not be enough time for the pt to be seen and have labs done in time.  I also checked Dr. Ethel Rana schedule to see if any availability was there and he was also full the rest of the afternoon.  I did check with Percell Miller to see what he could do for the pt and relayed to him, pt stated to me on the phone, her hemoglobin was 9 and she was feeling weak from having surgery two weeks ago.  Per Percell Miller, pt could not be seen today and would need to go to the ER if she was already here or Urgent Care, since she expressed more symptoms than originally scheduled for.  When I went back to the phone, pt's spouse was on the line and stated pt was on her way up to the office.  I let him know pt could not be seen today.  He was able to relay the message to the pt while I was on the phone along with what Percell Miller had told me to relay to the pt.

## 2019-09-19 NOTE — Discharge Instructions (Signed)
Your work-up today confirmed urinary tract infection which was exactly what you suspected with the painful urination.  We did send a culture as well.  Please take the antibiotics which you have tolerated in the past twice a day for the next week and follow-up with your primary doctor.  Please rest and stay hydrated.  If any symptoms change or worsen, please return to the nearest emergency department.

## 2019-09-19 NOTE — Telephone Encounter (Signed)
Also wanted to note the  fact she was very late potentially showing up  40 minutes late(I could not see her and delay care for others).  Also lab closes at 4 so would have been impossible to get cbc stat which is what I would have ordered.  I talked with Anderson Malta about 3:55.

## 2019-09-19 NOTE — ED Provider Notes (Signed)
[facility] EMERGENCY DEPARTMENT Provider Note   CSN: [id number] Arrival date & time: 09/19/19  1600     History Chief Complaint  Patient presents with  . Urinary Tract Infection    Alexis Olson is a 86 y.o. female.  The history is provided by the patient, medical records and the spouse. No language interpreter was used.  Dysuria Pain quality:  Burning Pain severity:  Mild Onset quality:  Gradual Duration:  1 day Timing:  Constant Progression:  Unchanged Chronicity:  Recurrent Recent urinary tract infections: yes   Relieved by:  Nothing Worsened by:  Nothing Ineffective treatments:  None tried Urinary symptoms: no discolored urine, no foul-smelling urine, no frequent urination, no hematuria, no hesitancy and no bladder incontinence   Associated symptoms: no abdominal pain, no fever, no flank pain, no nausea, no vaginal discharge and no vomiting        Past Medical History:  Diagnosis Date  . ALLERGIC RHINITIS   . ANEMIA, CHRONIC    Malabsorption related to bypass hx, B12 and iron deficiency  . ANXIETY   . Arthritis   . ASTHMA   . Bariatric surgery status   . Bowel perforation ([place]) 2016  . C. difficile diarrhea   . Colon perforation ([place]) 08/2014   following polypetomy during colo, surgical repair  . COLONIC POLYPS, ADENOMATOUS, HX OF 2010  . DIVERTICULOSIS, COLON   . DVT (deep venous thrombosis) ([place]) 2018  . DVT (deep venous thrombosis) ([place])   . GERD (gastroesophageal reflux disease) 08/23/2015  . Graded compression stocking in place   . History of blood transfusion   . History of migraine   . History of rheumatic fever   . HSV (herpes simplex virus) infection 08/16/2015  . Hyperlipidemia   . Hypertension    Denies  . Hyponatremia 05/02/2016  . Incomplete left bundle branch block (LBBB) 2016   Noted on EKG  . INSOMNIA   . Lumbosacral disc disease    Chronic pain; History of osteomyelitis 2010 following ESI complication  . Obesity   .  Osteomyelitis ([place])   . Pelvic fracture ([place])   . Pneumonia   . Recurrent UTI 08/23/2015  . URINARY URGENCY   . Ventral hernia without obstruction or gangrene 05/22/2018  . VITAMIN B12 DEFICIENCY     Patient Active Problem List   Diagnosis Date Noted  . S/P right TKA 08/26/2019  . Ventral hernia without obstruction or gangrene 05/22/2018  . Greenfield filter in place 03/08/2017  . Pain of right lower extremity 03/08/2017  . Preventative health care 02/13/2017  . Hyponatremia 05/02/2016  . GERD (gastroesophageal reflux disease) 08/23/2015  . HSV (herpes simplex virus) infection 08/16/2015  . Hyperlipidemia, mild 08/16/2015  . Osteoporosis   . Chronic diarrhea   . Anemia   . OSTEOMYELITIS, VERTEBRA 05/05/2009  . [place] DISEASE, LUMBOSACRAL SPINE 04/22/2009  . Vitamin B 12 deficiency 07/09/2008  . DIVERTICULOSIS, COLON 07/09/2008  . COLONIC POLYPS, ADENOMATOUS, HX OF 07/09/2008  . Anxiety state 08/08/2007  . INSOMNIA 08/08/2007  . Bariatric surgery status 08/08/2007  . Midline back pain 10/30/2006  . ALLERGIC RHINITIS 09/18/2006    Past Surgical History:  Procedure Laterality Date  . [place]   for ruptured disc  . CHOLECYSTECTOMY    . COLONOSCOPY    . greenfield filter    . ileojejunal bypass  1976   for obesity  . LAPAROTOMY N/A 09/18/2014   Procedure: EXPLORATORY LAPAROTOMY WITH REPAIR OF CECIAL PERFORATION;  Surgeon: Autumn Messing III, MD;  Location: [facility];  Service: General;  Laterality: N/A;  . TONSILLECTOMY    . TOTAL KNEE ARTHROPLASTY Right 08/26/2019   Procedure: RIGHT TOTAL KNEE ARTHROPLASTY, LEFT KNEE CORTISONE INJECTION;  Surgeon: Paralee Cancel, MD;  Location: [facility];  Service: Orthopedics;  Laterality: Right;  70 MINS  . UPPER GI ENDOSCOPY    . VASCULAR SURGERY Right    revascularization right lower extremity     OB History   No obstetric history on file.     Family History  Problem Relation Age of Onset  . Cervical cancer Mother   . Stroke  Mother   . Liver disease Sister   . Kidney disease Sister   . Cirrhosis Sister   . Colon cancer Neg Hx   . Esophageal cancer Neg Hx   . Rectal cancer Neg Hx   . Stomach cancer Neg Hx     Social History   Tobacco Use  . Smoking status: Never Smoker  . Smokeless tobacco: Never Used  Substance Use Topics  . Alcohol use: No    Alcohol/week: 0.0 standard drinks  . Drug use: No    Home Medications Prior to Admission medications   Medication Sig Start Date End Date Taking? Authorizing Provider  acetaminophen (TYLENOL) 500 MG tablet Take 2 tablets (1,000 mg total) by mouth every 8 (eight) hours. 08/27/19   Maurice March, PA-C  albuterol (PROVENTIL HFA;VENTOLIN HFA) 108 (90 BASE) MCG/ACT inhaler Inhale 1-2 puffs into the lungs every 6 (six) hours as needed for wheezing or shortness of breath.    [provider]  ALPRAZolam (XANAX) 1 MG tablet TAKE 1/2 TO 1 TABLET BY MOUTH AT BEDTIME AS NEEDED Patient taking differently: Take 0.5 mg by mouth at bedtime as needed for anxiety or sleep.  03/10/19   Mosie Lukes, MD  amitriptyline (ELAVIL) 100 MG tablet Take 1 tablet (100 mg total) by mouth daily. 09/17/19   Mosie Lukes, MD  aspirin (ASPIRIN CHILDRENS) 81 MG chewable tablet Chew 1 tablet (81 mg total) by mouth 2 (two) times daily. Start the day after finishing the Xarelto.  Take for 4 weeks, then resume regular dose. 09/12/19 10/12/19  Maurice March, PA-C  azelastine (OPTIVAR) 0.05 % ophthalmic solution Place 1 drop into both eyes daily as needed (for dry eyes).     [provider]  benzonatate (TESSALON) 100 MG capsule Take 1 capsule (100 mg total) by mouth 3 (three) times daily as needed for cough. 04/01/18   Saguier, Percell Miller, PA-C  budesonide-formoterol (SYMBICORT) 160-4.5 MCG/ACT inhaler Inhale 2 puffs into the lungs See admin instructions. Inhale 2 puffs into the lungs one to two times a day Patient taking differently: Inhale 2 puffs into the lungs in the morning and  at bedtime. Inhale 2 puffs into the lungs one to two times a day 03/04/18   Saguier, Percell Miller, PA-C  cetirizine (ZYRTEC) 10 MG tablet Take 10 mg by mouth daily.  02/05/14   [provider]  cyanocobalamin (,VITAMIN B-12,) 1000 MCG/ML injection Inject 1cc into the muscle every 3 months 04/16/19   Gatha Mayer, MD  dicyclomine (BENTYL) 10 MG capsule Take 1 capsule (10 mg total) by mouth 3 (three) times daily as needed (for diarrhea & symptoms). 12/05/18   Gatha Mayer, MD  diphenoxylate-atropine (LOMOTIL) 2.5-0.025 MG tablet Take 2 tablets 3 times a day as needed for diarrhea 09/17/19   Gatha Mayer, MD  docusate sodium (COLACE) 100  MG capsule Take 1 capsule (100 mg total) by mouth 2 (two) times daily. 08/27/19   Griffith Citron R, PA-C  Fe Fum-FA-B Cmp-C-Zn-Mg-Mn-Cu (HEMOCYTE PLUS) 106-1 MG CAPS TAKE 1 CAPSULE BY MOUTH 2 TIMES DAILY. 09/02/19   Gatha Mayer, MD  ferrous sulfate (FERROUSUL) 325 (65 FE) MG tablet Take 1 tablet (325 mg total) by mouth 3 (three) times daily with meals for 14 days. 08/27/19 09/10/19  Maurice March, PA-C  fluticasone (FLONASE) 50 MCG/ACT nasal spray 2 sprays by Nasal route daily.      [provider]  gabapentin (NEURONTIN) 300 MG capsule TAKE 1 CAPSULE BY MOUTH AT BEDTIME Patient taking differently: Take 300 mg by mouth at bedtime.  08/04/19   Mosie Lukes, MD  hydrOXYzine (ATARAX) 25 MG tablet Take 25 mg by mouth 3 (three) times daily as needed for itching.     [provider]  levocetirizine (XYZAL) 5 MG tablet Take 1 tablet (5 mg total) by mouth every evening. 08/04/14   Rowe Clack, MD  lubiprostone (AMITIZA) 24 MCG capsule Take 24 mcg by mouth daily as needed for constipation.    [provider]  magnesium oxide (MAG-OX 400) 400 MG tablet Take 400 mg by mouth 2 (two) times daily.     [provider]  methocarbamol (ROBAXIN) 500 MG tablet Take 1 tablet (500 mg total) by mouth every 6 (six) hours as needed for  muscle spasms. 08/27/19   Maurice March, PA-C  montelukast (SINGULAIR) 10 MG tablet Take 10 mg by mouth at bedtime.  02/11/14   [provider]  omeprazole (PRILOSEC) 20 MG capsule TAKE 1 CAPSULE BY MOUTH DAILY. Patient taking differently: Take 20 mg by mouth daily.  06/16/19   Gatha Mayer, MD  ondansetron (ZOFRAN-ODT) 4 MG disintegrating tablet Take 1 tablet (4 mg total) by mouth 3 (three) times daily as needed for nausea or vomiting. 08/27/19   Maurice March, PA-C  oxyCODONE (OXY IR/ROXICODONE) 5 MG immediate release tablet Take 1-2 tablets (5-10 mg total) by mouth every 4 (four) hours as needed for moderate pain or severe pain. 08/27/19   Maurice March, PA-C  phenazopyridine (PYRIDIUM) 200 MG tablet Take 200 mg by mouth 3 (three) times daily as needed for pain.     [provider]  polyethylene glycol (MIRALAX / GLYCOLAX) 17 g packet Take 17 g by mouth 2 (two) times daily. 08/27/19   Maurice March, PA-C  potassium chloride SA (KLOR-CON) 20 MEQ tablet TAKE 1 TABLET BY MOUTH ONCE DAILY 09/12/19   Mosie Lukes, MD  Probiotic Product (PROBIOTIC DAILY PO) Take 1 capsule by mouth daily.    [provider]  rivaroxaban (XARELTO) 10 MG TABS tablet Take 1 tablet (10 mg total) by mouth daily for 14 days. 08/28/19 09/11/19  Maurice March, PA-C  valACYclovir (VALTREX) 500 MG tablet TAKE 1 TABLET (500 MG TOTAL) BY MOUTH AS NEEDED. 09/12/18   Mosie Lukes, MD  Vitamin D, Ergocalciferol, (DRISDOL) 1.25 MG (50000 UT) CAPS capsule Take 1 capsule (50,000 Units total) by mouth every 7 (seven) days. 09/24/18   Mosie Lukes, MD  vitamin E 400 UNIT capsule Take 400 Units by mouth daily.      [provider]  zolpidem (AMBIEN) 5 MG tablet TAKE 1 TABLET BY MOUTH AT BEDTIME. Patient taking differently: Take 5 mg by mouth at bedtime.  04/15/19   Mosie Lukes, MD    Allergies  Ativan [lorazepam], Other, Azithromycin, Benadryl [diphenhydramine hcl], Ciprofloxacin  hcl, Codeine phosphate, Hydrocodone, Hydrocodone-homatropine, Levaquin [levofloxacin in d5w], Levofloxacin, Meperidine hcl, Molds & smuts, Sulfa antibiotics, Tape, and Ultram [tramadol hcl]  Review of Systems   Review of Systems  Constitutional: Positive for fatigue. Negative for chills, diaphoresis and fever.  HENT: Negative for congestion.   Eyes: Negative for visual disturbance.  Respiratory: Negative for cough, chest tightness, shortness of breath, wheezing and stridor.   Cardiovascular: Negative for chest pain, palpitations and leg swelling.  Gastrointestinal: Negative for abdominal pain, constipation, diarrhea, nausea and vomiting.  Genitourinary: Positive for dysuria. Negative for decreased urine volume, flank pain, frequency, menstrual problem, vaginal bleeding, vaginal discharge and vaginal pain.  Musculoskeletal: Negative for back pain, neck pain and neck stiffness.  Neurological: Negative for dizziness, light-headedness, numbness and headaches.  Psychiatric/Behavioral: Negative for agitation and confusion.  All other systems reviewed and are negative.   Physical Exam Updated Vital Signs BP 126/71   Pulse 99   Temp 99.2 F (37.3 C) (Oral)   Resp 18   Ht 5' 5.5" (1.664 m)   Wt 81.6 kg   SpO2 100%   BMI 29.50 kg/m   Physical Exam Vitals and nursing note reviewed.  Constitutional:      General: She is not in acute distress.    Appearance: She is well-developed. She is not ill-appearing, toxic-appearing or diaphoretic.  HENT:     Head: Normocephalic and atraumatic.     Right Ear: External ear normal.     Left Ear: External ear normal.     Nose: Nose normal. No congestion or rhinorrhea.     Mouth/Throat:     Mouth: Mucous membranes are moist.     Pharynx: No oropharyngeal exudate or posterior oropharyngeal erythema.  Eyes:     Conjunctiva/sclera: Conjunctivae normal.     Pupils: Pupils are equal, round, and reactive to light.  Cardiovascular:     Rate and Rhythm:  Normal rate.     Pulses: Normal pulses.     Heart sounds: No murmur heard.   Pulmonary:     Effort: Pulmonary effort is normal. No respiratory distress.     Breath sounds: No stridor. No wheezing, rhonchi or rales.  Chest:     Chest wall: No tenderness.  Abdominal:     General: Abdomen is flat. There is no distension.     Tenderness: There is no abdominal tenderness. There is no right CVA tenderness, left CVA tenderness, guarding or rebound.  Musculoskeletal:        General: No tenderness.     Cervical back: Normal range of motion and neck supple.  Skin:    General: Skin is warm.     Coloration: Skin is not pale.     Findings: No erythema or rash.  Neurological:     General: No focal deficit present.     Mental Status: She is alert and oriented to person, place, and time.     Motor: No abnormal muscle tone.     Deep Tendon Reflexes: Reflexes are normal and symmetric.  Psychiatric:        Mood and Affect: Mood normal.     ED Results / Procedures / Treatments   Labs (all labs ordered are listed, but only abnormal results are displayed) Labs Reviewed  URINALYSIS, ROUTINE W REFLEX MICROSCOPIC - Abnormal; Notable for the following components:      Result Value   Color, Urine STRAW (*)    APPearance CLOUDY (*)  Specific Gravity, Urine <1.005 (*)    Hgb urine dipstick LARGE (*)    Protein, ur 30 (*)    Nitrite POSITIVE (*)    Leukocytes,Ua LARGE (*)    All other components within normal limits  URINALYSIS, MICROSCOPIC (REFLEX) - Abnormal; Notable for the following components:   Bacteria, UA MANY (*)    All other components within normal limits  URINE CULTURE    EKG None  Radiology No results found.  Procedures Procedures (including critical care time)  Medications Ordered in ED Medications - No data to display  ED Course  I have reviewed the triage vital signs and the nursing notes.  Pertinent labs & imaging results that were available during my care of the  patient were reviewed by me and considered in my medical decision making (see chart for details).    MDM Rules/Calculators/A&P                          AMA MCMASTER is a 86 y.o. female with a past medical history significant for chronic anemia, hyperlipidemia, GERD, prior urinary tract infections and recent knee surgery who presents with dysuria and painful urination.  Patient reports that she is suspicious she has a urinary tract infection given the burning when she pees.  Started last night.  She reports she has had one several months ago and wanted to get checked today.  She attempted to see her PCP upstairs this afternoon but there was a problem with the scheduling and she had to come downstairs for evaluation.  She denies abdominal pain otherwise.  She denies any fevers, chills, nausea, vomiting, constipation, or diarrhea.  She does report feeling tired at times and has had chronic anemia but she does not want to get that checked in the emergency department today and rather follow-up with her PCP for that.  She denies any other complaints.  On exam, lungs clear and chest nontender.  Abdomen nontender.  Good bowel sounds.  Patient resting comfortably with no complaints.  Patient had a urinalysis which confirmed UTI with nitrites, leukocytes, and bacteria.  Culture will be sent.  She reports he has tolerated Keflex well in the past and prior culture shows that it was sensitive to cephalosporins.  Will give prescription for Keflex and she will follow up with her PCP.  Patient agrees with plan of care and does not want any further work-up today.  She understands return precautions for symptomatic anemia or other complaints.  She no other questions or concerns and was discharged in good condition.   Final Clinical Impression(s) / ED Diagnoses Final diagnoses:  Acute cystitis without hematuria  Dysuria    Rx / DC Orders ED Discharge Orders         Ordered    cephALEXin (KEFLEX) 500 MG capsule   2 times daily     Discontinue  Reprint     09/19/19 1656         Clinical Impression: 1. Acute cystitis without hematuria   2. Dysuria     Disposition: Discharge  Condition: Good  I have discussed the results, Dx and Tx plan with the pt(& family if present). He/she/they expressed understanding and agree(s) with the plan. Discharge instructions discussed at great length. Strict return precautions discussed and pt &/or family have verbalized understanding of the instructions. No further questions at time of discharge.    New Prescriptions   CEPHALEXIN (KEFLEX) 500 MG CAPSULE  Take 1 capsule (500 mg total) by mouth 2 (two) times daily for 7 days.    Follow Up: Mosie Lukes, MD [address] [place] [address] [phone]     [facility] [place] [facility] [address] [id number] [facility] [place] [phone]       Perez Dirico, Gwenyth Allegra, MD 09/19/19 [phone]

## 2019-09-20 LAB — URINE CULTURE: Culture: <10000 — AB

## 2019-09-22 ENCOUNTER — Telehealth: Payer: Self-pay | Admitting: Family Medicine

## 2019-09-22 NOTE — Telephone Encounter (Signed)
Yes please add a cmp

## 2019-09-22 NOTE — Telephone Encounter (Signed)
Patient states that she would like to come get labs draw today. Patient states her husband is coming in for office visit. Explained to patient that we did not have any available appointment today. Patient not happy and I stated that I would reach out to CMA of Dr Charlett Blake to see what we can do.

## 2019-09-22 NOTE — Addendum Note (Signed)
Addended by: Kem Boroughs D on: 09/22/2019 01:29 PM   Modules accepted: Orders

## 2019-09-22 NOTE — Addendum Note (Signed)
Addended by: Kem Boroughs D on: 09/22/2019 01:27 PM   Modules accepted: Orders

## 2019-09-22 NOTE — Telephone Encounter (Signed)
Patient would also like electrolytes testing as well.  I already see an order for CBC but can we add a test for electrolytes as well.  She was at the ED on Friday for UTI.

## 2019-09-22 NOTE — Telephone Encounter (Signed)
Patient will go to [facility] for blood draw either today or tomorrow.

## 2019-09-22 NOTE — Addendum Note (Signed)
Addended by: Kem Boroughs D on: 09/22/2019 09:07 AM   Modules accepted: Orders

## 2019-09-23 ENCOUNTER — Other Ambulatory Visit (INDEPENDENT_AMBULATORY_CARE_PROVIDER_SITE_OTHER): Payer: PPO

## 2019-09-23 DIAGNOSIS — M25661 Stiffness of right knee, not elsewhere classified: Secondary | ICD-10-CM | POA: Diagnosis not present

## 2019-09-23 DIAGNOSIS — D62 Acute posthemorrhagic anemia: Secondary | ICD-10-CM | POA: Diagnosis not present

## 2019-09-23 LAB — COMPREHENSIVE METABOLIC PANEL
ALT: 11 U/L (ref 0–35)
AST: 13 U/L (ref 0–37)
Albumin: 4 g/dL (ref 3.5–5.2)
Alkaline Phosphatase: 66 U/L (ref 39–117)
BUN: 13 mg/dL (ref 6–23)
CO2: 31 mEq/L (ref 19–32)
Calcium: 9 mg/dL (ref 8.4–10.5)
Chloride: 97 mEq/L (ref 96–112)
Creatinine, Ser: 0.5 mg/dL (ref 0.40–1.20)
GFR: 117.43 mL/min (ref >60.00)
Glucose, Bld: 114 mg/dL — ABNORMAL HIGH (ref 70–99)
Potassium: 4.2 mEq/L (ref 3.5–5.1)
Sodium: 133 mEq/L — ABNORMAL LOW (ref 135–145)
Total Bilirubin: 0.3 mg/dL (ref 0.2–1.2)
Total Protein: 6.9 g/dL (ref 6.0–8.3)

## 2019-09-23 LAB — CBC
HCT: 32.1 % — ABNORMAL LOW (ref 36.0–46.0)
Hemoglobin: 10.6 g/dL — ABNORMAL LOW (ref 12.0–15.0)
MCHC: 33.1 g/dL (ref 30.0–36.0)
MCV: 95 fl (ref 78.0–100.0)
Platelets: 349 10*3/uL (ref 150.0–400.0)
RBC: 3.37 Mil/uL — ABNORMAL LOW (ref 3.87–5.11)
RDW: 14.1 % (ref 11.5–15.5)
WBC: 7.4 10*3/uL (ref 4.0–10.5)

## 2019-09-24 ENCOUNTER — Telehealth: Payer: Self-pay | Admitting: Family Medicine

## 2019-09-24 ENCOUNTER — Other Ambulatory Visit: Payer: Self-pay | Admitting: Family Medicine

## 2019-09-24 NOTE — Progress Notes (Signed)
  Chronic Care Management   Note  09/24/2019 Name: Alexis Olson MRN: [id number] DOB: 28-Sep-1933  Alexis Olson is a 86 y.o. year old female who is a primary care patient of Mosie Lukes, MD. I reached out to [name] by phone today in response to a referral sent by Ms. Renaldo Fiddler Dimercurio's PCP, Mosie Lukes, MD.   Alexis Olson was given information about Chronic Care Management services today including:  1. CCM service includes personalized support from designated clinical staff supervised by her physician, including individualized plan of care and coordination with other care providers 2. 24/7 contact phone numbers for assistance for urgent and routine care needs. 3. Service will only be billed when office clinical staff spend 20 minutes or more in a month to coordinate care. 4. Only one practitioner may furnish and bill the service in a calendar month. 5. The patient may stop CCM services at any time (effective at the end of the month) by phone call to the office staff.   Patient wishes to consider information provided and/or speak with a member of the care team before deciding about enrollment in care management services.   Follow up plan:   [place]

## 2019-09-25 ENCOUNTER — Other Ambulatory Visit: Payer: Self-pay | Admitting: Family Medicine

## 2019-09-25 MED FILL — VIT D2 1.25 MG (50,000 UNIT: 1.25 MG | 84 days supply | Qty: 12 | Fill #0

## 2019-09-26 ENCOUNTER — Telehealth: Payer: Self-pay | Admitting: Family Medicine

## 2019-09-26 DIAGNOSIS — M79604 Pain in right leg: Secondary | ICD-10-CM | POA: Diagnosis not present

## 2019-09-26 NOTE — Telephone Encounter (Signed)
[place] Call back Phone number: [phone]  Patient would like a phone call back today with lab results.

## 2019-09-30 ENCOUNTER — Encounter: Payer: Self-pay | Admitting: *Deleted

## 2019-09-30 ENCOUNTER — Telehealth: Payer: Self-pay

## 2019-09-30 DIAGNOSIS — D62 Acute posthemorrhagic anemia: Secondary | ICD-10-CM

## 2019-09-30 DIAGNOSIS — E871 Hypo-osmolality and hyponatremia: Secondary | ICD-10-CM

## 2019-09-30 NOTE — Telephone Encounter (Signed)
Her anemia is greatly improved but it was low enough to cause her notable fatigue and even as it is improving she will feel tired for awhile. The mild low sodium should not be contributing to her fatigue. If she is having any urinary symptoms could check a UA and culture. Make sure she has an appt in next 1 to 2 months to discuss symptoms and next steps.

## 2019-09-30 NOTE — Telephone Encounter (Signed)
Pt called stating her hemoglobin is at 10.  Her sodium level is low and she feels tired all the time.  She has felt this way since her surgery that was done the beginning of June.  She wants to know what can be done about this.

## 2019-09-30 NOTE — Telephone Encounter (Signed)
Gave patient lab results this morning and labs were done on 6/29.

## 2019-09-30 NOTE — Telephone Encounter (Signed)
Patient notified of lab results and copy mailed to patient.

## 2019-10-01 DIAGNOSIS — M79604 Pain in right leg: Secondary | ICD-10-CM | POA: Diagnosis not present

## 2019-10-01 NOTE — Telephone Encounter (Signed)
Patient notified.  Appointment made and order placed for her to get labs redrawn.

## 2019-10-02 ENCOUNTER — Telehealth: Payer: Self-pay

## 2019-10-02 NOTE — Telephone Encounter (Signed)
Pt's daughter, Darrick Grinder, called concerned about pt's low hemoglobin and low sodium levels.  She is upset no one is doing anything abt this and that pt's next appt is not until Sept with her provider.  CB # for [employer] [phone].

## 2019-10-03 DIAGNOSIS — M79604 Pain in right leg: Secondary | ICD-10-CM | POA: Diagnosis not present

## 2019-10-03 NOTE — Telephone Encounter (Signed)
Spoke with daughter and advised her of results again.  She apparently patient did not understand what was told to her.  Daughter was very Patent attorney.

## 2019-10-04 MED FILL — MONTELUKAST SOD 10 MG TAB: 10 | 90 days supply | Qty: 90 | Fill #2

## 2019-10-07 ENCOUNTER — Other Ambulatory Visit: Payer: Self-pay | Admitting: Family Medicine

## 2019-10-07 ENCOUNTER — Telehealth: Payer: Self-pay | Admitting: Internal Medicine

## 2019-10-07 NOTE — Telephone Encounter (Signed)
Alexis Olson wanted her B12 injectable refilled. She had it checked last November and it was high. She was told then to take 1cc every 3 months. She just had knee surgery and is going to have labs drawn at the end of July. She thought she might need the shot monthly. May I order a B12 level Sir?

## 2019-10-07 NOTE — Telephone Encounter (Signed)
Pt is requesting a call back from someone in regards to her B12 injectable.

## 2019-10-07 NOTE — Telephone Encounter (Signed)
She really does not need another level It won't be low  I think every 3 mos is enough but if she wants it monthly ok to do that  [facility] to refill

## 2019-10-08 ENCOUNTER — Other Ambulatory Visit: Payer: Self-pay | Admitting: Internal Medicine

## 2019-10-08 DIAGNOSIS — M25661 Stiffness of right knee, not elsewhere classified: Secondary | ICD-10-CM | POA: Insufficient documentation

## 2019-10-08 DIAGNOSIS — M79604 Pain in right leg: Secondary | ICD-10-CM | POA: Diagnosis not present

## 2019-10-08 DIAGNOSIS — Z471 Aftercare following joint replacement surgery: Secondary | ICD-10-CM | POA: Diagnosis not present

## 2019-10-08 DIAGNOSIS — Z96651 Presence of right artificial knee joint: Secondary | ICD-10-CM | POA: Diagnosis not present

## 2019-10-08 DIAGNOSIS — M1712 Unilateral primary osteoarthritis, left knee: Secondary | ICD-10-CM | POA: Diagnosis not present

## 2019-10-08 MED ORDER — CYANOCOBALAMIN 1000 MCG/ML IJ SOLN: INTRAMUSCULAR | 1 refills | Status: DC

## 2019-10-08 MED FILL — ZOLPIDEM TARTRATE 5 MG TAB: 5 | 90 days supply | Qty: 90 | Fill #0

## 2019-10-08 MED FILL — CYANOCOBALAMIN 1,000 MCG/ML: 1000 | 84 days supply | Qty: 3 | Fill #0

## 2019-10-08 NOTE — Telephone Encounter (Signed)
I spoke with Alexis Olson and she wants to do it monthly so I will refill it accordingly.

## 2019-10-08 NOTE — Telephone Encounter (Signed)
Her daughter is an ED RN and gives the B12 to her.

## 2019-10-10 DIAGNOSIS — M79604 Pain in right leg: Secondary | ICD-10-CM | POA: Diagnosis not present

## 2019-10-10 DIAGNOSIS — M25661 Stiffness of right knee, not elsewhere classified: Secondary | ICD-10-CM | POA: Diagnosis not present

## 2019-10-14 DIAGNOSIS — M25661 Stiffness of right knee, not elsewhere classified: Secondary | ICD-10-CM | POA: Diagnosis not present

## 2019-10-16 DIAGNOSIS — M25661 Stiffness of right knee, not elsewhere classified: Secondary | ICD-10-CM | POA: Diagnosis not present

## 2019-10-17 ENCOUNTER — Other Ambulatory Visit: Payer: PPO

## 2019-10-17 DIAGNOSIS — D62 Acute posthemorrhagic anemia: Secondary | ICD-10-CM | POA: Diagnosis not present

## 2019-10-17 DIAGNOSIS — E871 Hypo-osmolality and hyponatremia: Secondary | ICD-10-CM

## 2019-10-17 NOTE — Addendum Note (Signed)
Addended by: Isaiah Serge D on: 10/17/2019 05:14 PM   Modules accepted: Orders

## 2019-10-18 LAB — CBC
HCT: 38.2 % (ref 35.0–45.0)
Hemoglobin: 12.2 g/dL (ref 11.7–15.5)
MCH: 30.4 pg (ref 27.0–33.0)
MCHC: 31.9 g/dL — ABNORMAL LOW (ref 32.0–36.0)
MCV: 95.3 fL (ref 80.0–100.0)
MPV: 9.7 fL (ref 7.5–12.5)
Platelets: 322 10*3/uL (ref 140–400)
RBC: 4.01 10*6/uL (ref 3.80–5.10)
RDW: 12.4 % (ref 11.0–15.0)
WBC: 7 10*3/uL (ref 3.8–10.8)

## 2019-10-18 LAB — COMPREHENSIVE METABOLIC PANEL
AG Ratio: 1.5 (calc) (ref 1.0–2.5)
ALT: 10 U/L (ref 6–29)
AST: 15 U/L (ref 10–35)
Albumin: 4.1 g/dL (ref 3.6–5.1)
Alkaline phosphatase (APISO): 61 U/L (ref 37–153)
BUN: 10 mg/dL (ref 7–25)
CO2: 24 mmol/L (ref 20–32)
Calcium: 9.4 mg/dL (ref 8.6–10.4)
Chloride: 103 mmol/L (ref 98–110)
Creat: 0.69 mg/dL (ref 0.60–0.88)
Globulin: 2.8 g/dL (calc) (ref 1.9–3.7)
Glucose, Bld: 124 mg/dL — ABNORMAL HIGH (ref 65–99)
Potassium: 3.6 mmol/L (ref 3.5–5.3)
Sodium: 137 mmol/L (ref 135–146)
Total Bilirubin: 0.4 mg/dL (ref 0.2–1.2)
Total Protein: 6.9 g/dL (ref 6.1–8.1)

## 2019-10-22 DIAGNOSIS — M25661 Stiffness of right knee, not elsewhere classified: Secondary | ICD-10-CM | POA: Diagnosis not present

## 2019-10-24 DIAGNOSIS — M25661 Stiffness of right knee, not elsewhere classified: Secondary | ICD-10-CM | POA: Diagnosis not present

## 2019-10-28 MED FILL — LEVOCETIRIZINE 5 MG TABLET: 5 | 90 days supply | Qty: 90 | Fill #2

## 2019-10-30 DIAGNOSIS — M25661 Stiffness of right knee, not elsewhere classified: Secondary | ICD-10-CM | POA: Diagnosis not present

## 2019-11-03 DIAGNOSIS — M25561 Pain in right knee: Secondary | ICD-10-CM | POA: Diagnosis not present

## 2019-11-03 DIAGNOSIS — M25562 Pain in left knee: Secondary | ICD-10-CM | POA: Diagnosis not present

## 2019-11-03 DIAGNOSIS — Z471 Aftercare following joint replacement surgery: Secondary | ICD-10-CM | POA: Diagnosis not present

## 2019-11-03 DIAGNOSIS — Z96651 Presence of right artificial knee joint: Secondary | ICD-10-CM | POA: Diagnosis not present

## 2019-11-03 DIAGNOSIS — M1712 Unilateral primary osteoarthritis, left knee: Secondary | ICD-10-CM | POA: Diagnosis not present

## 2019-11-03 MED FILL — oxyCODONE HCL 5 MG TABS: 5 | 7 days supply | Qty: 30 | Fill #0

## 2019-11-06 ENCOUNTER — Emergency Department (HOSPITAL_COMMUNITY): Payer: PPO

## 2019-11-06 ENCOUNTER — Inpatient Hospital Stay (HOSPITAL_COMMUNITY)
Admission: EM | Admit: 2019-11-06 | Discharge: 2019-11-16 | DRG: 092 | Disposition: A | Discharge status: Home Health | Payer: PPO | Attending: Internal Medicine | Admitting: Internal Medicine

## 2019-11-06 ENCOUNTER — Encounter (HOSPITAL_COMMUNITY): Payer: Self-pay

## 2019-11-06 ENCOUNTER — Other Ambulatory Visit: Payer: Self-pay

## 2019-11-06 DIAGNOSIS — M65841 Other synovitis and tenosynovitis, right hand: Secondary | ICD-10-CM | POA: Diagnosis not present

## 2019-11-06 DIAGNOSIS — W19XXXS Unspecified fall, sequela: Secondary | ICD-10-CM | POA: Diagnosis not present

## 2019-11-06 DIAGNOSIS — Z86010 Personal history of colonic polyps: Secondary | ICD-10-CM

## 2019-11-06 DIAGNOSIS — Z9884 Bariatric surgery status: Secondary | ICD-10-CM

## 2019-11-06 DIAGNOSIS — Z86718 Personal history of other venous thrombosis and embolism: Secondary | ICD-10-CM

## 2019-11-06 DIAGNOSIS — M25511 Pain in right shoulder: Secondary | ICD-10-CM | POA: Diagnosis not present

## 2019-11-06 DIAGNOSIS — I10 Essential (primary) hypertension: Secondary | ICD-10-CM

## 2019-11-06 DIAGNOSIS — Z7952 Long term (current) use of systemic steroids: Secondary | ICD-10-CM

## 2019-11-06 DIAGNOSIS — L03123 Acute lymphangitis of right upper limb: Secondary | ICD-10-CM | POA: Diagnosis not present

## 2019-11-06 DIAGNOSIS — Z888 Allergy status to other drugs, medicaments and biological substances status: Secondary | ICD-10-CM

## 2019-11-06 DIAGNOSIS — W19XXXA Unspecified fall, initial encounter: Secondary | ICD-10-CM | POA: Diagnosis present

## 2019-11-06 DIAGNOSIS — Z882 Allergy status to sulfonamides status: Secondary | ICD-10-CM | POA: Diagnosis not present

## 2019-11-06 DIAGNOSIS — E860 Dehydration: Secondary | ICD-10-CM | POA: Diagnosis present

## 2019-11-06 DIAGNOSIS — K219 Gastro-esophageal reflux disease without esophagitis: Secondary | ICD-10-CM | POA: Diagnosis not present

## 2019-11-06 DIAGNOSIS — R6510 Systemic inflammatory response syndrome (SIRS) of non-infectious origin without acute organ dysfunction: Secondary | ICD-10-CM | POA: Diagnosis not present

## 2019-11-06 DIAGNOSIS — Y92009 Unspecified place in unspecified non-institutional (private) residence as the place of occurrence of the external cause: Secondary | ICD-10-CM | POA: Diagnosis not present

## 2019-11-06 DIAGNOSIS — D509 Iron deficiency anemia, unspecified: Secondary | ICD-10-CM | POA: Diagnosis present

## 2019-11-06 DIAGNOSIS — M810 Age-related osteoporosis without current pathological fracture: Secondary | ICD-10-CM | POA: Diagnosis present

## 2019-11-06 DIAGNOSIS — E871 Hypo-osmolality and hyponatremia: Secondary | ICD-10-CM | POA: Diagnosis not present

## 2019-11-06 DIAGNOSIS — M7989 Other specified soft tissue disorders: Secondary | ICD-10-CM

## 2019-11-06 DIAGNOSIS — S99912A Unspecified injury of left ankle, initial encounter: Secondary | ICD-10-CM | POA: Diagnosis not present

## 2019-11-06 DIAGNOSIS — R9431 Abnormal electrocardiogram [ECG] [EKG]: Secondary | ICD-10-CM | POA: Diagnosis not present

## 2019-11-06 DIAGNOSIS — I82432 Acute embolism and thrombosis of left popliteal vein: Secondary | ICD-10-CM | POA: Diagnosis present

## 2019-11-06 DIAGNOSIS — I447 Left bundle-branch block, unspecified: Secondary | ICD-10-CM | POA: Diagnosis not present

## 2019-11-06 DIAGNOSIS — Z823 Family history of stroke: Secondary | ICD-10-CM

## 2019-11-06 DIAGNOSIS — R011 Cardiac murmur, unspecified: Secondary | ICD-10-CM | POA: Diagnosis not present

## 2019-11-06 DIAGNOSIS — Z8049 Family history of malignant neoplasm of other genital organs: Secondary | ICD-10-CM

## 2019-11-06 DIAGNOSIS — S199XXA Unspecified injury of neck, initial encounter: Secondary | ICD-10-CM | POA: Diagnosis not present

## 2019-11-06 DIAGNOSIS — Z885 Allergy status to narcotic agent status: Secondary | ICD-10-CM | POA: Diagnosis not present

## 2019-11-06 DIAGNOSIS — Z20822 Contact with and (suspected) exposure to covid-19: Secondary | ICD-10-CM | POA: Diagnosis not present

## 2019-11-06 DIAGNOSIS — M542 Cervicalgia: Secondary | ICD-10-CM

## 2019-11-06 DIAGNOSIS — I824Y2 Acute embolism and thrombosis of unspecified deep veins of left proximal lower extremity: Secondary | ICD-10-CM | POA: Diagnosis not present

## 2019-11-06 DIAGNOSIS — M19041 Primary osteoarthritis, right hand: Secondary | ICD-10-CM | POA: Diagnosis not present

## 2019-11-06 DIAGNOSIS — R338 Other retention of urine: Secondary | ICD-10-CM | POA: Diagnosis not present

## 2019-11-06 DIAGNOSIS — M545 Low back pain: Secondary | ICD-10-CM | POA: Diagnosis not present

## 2019-11-06 DIAGNOSIS — E538 Deficiency of other specified B group vitamins: Secondary | ICD-10-CM | POA: Diagnosis not present

## 2019-11-06 DIAGNOSIS — G92 Toxic encephalopathy: Secondary | ICD-10-CM | POA: Diagnosis not present

## 2019-11-06 DIAGNOSIS — R531 Weakness: Secondary | ICD-10-CM | POA: Diagnosis not present

## 2019-11-06 DIAGNOSIS — I350 Nonrheumatic aortic (valve) stenosis: Secondary | ICD-10-CM | POA: Diagnosis not present

## 2019-11-06 DIAGNOSIS — R0902 Hypoxemia: Secondary | ICD-10-CM | POA: Diagnosis not present

## 2019-11-06 DIAGNOSIS — R000 Tachycardia, unspecified: Secondary | ICD-10-CM | POA: Diagnosis not present

## 2019-11-06 DIAGNOSIS — M25572 Pain in left ankle and joints of left foot: Secondary | ICD-10-CM | POA: Diagnosis not present

## 2019-11-06 DIAGNOSIS — T4275XA Adverse effect of unspecified antiepileptic and sedative-hypnotic drugs, initial encounter: Secondary | ICD-10-CM | POA: Diagnosis present

## 2019-11-06 DIAGNOSIS — E785 Hyperlipidemia, unspecified: Secondary | ICD-10-CM | POA: Diagnosis not present

## 2019-11-06 DIAGNOSIS — G8929 Other chronic pain: Secondary | ICD-10-CM | POA: Diagnosis not present

## 2019-11-06 DIAGNOSIS — Z9049 Acquired absence of other specified parts of digestive tract: Secondary | ICD-10-CM

## 2019-11-06 DIAGNOSIS — D72829 Elevated white blood cell count, unspecified: Secondary | ICD-10-CM | POA: Diagnosis present

## 2019-11-06 DIAGNOSIS — Z91018 Allergy to other foods: Secondary | ICD-10-CM

## 2019-11-06 DIAGNOSIS — E876 Hypokalemia: Secondary | ICD-10-CM | POA: Diagnosis not present

## 2019-11-06 DIAGNOSIS — S6991XA Unspecified injury of right wrist, hand and finger(s), initial encounter: Secondary | ICD-10-CM | POA: Diagnosis not present

## 2019-11-06 DIAGNOSIS — L03113 Cellulitis of right upper limb: Secondary | ICD-10-CM | POA: Diagnosis not present

## 2019-11-06 DIAGNOSIS — I82402 Acute embolism and thrombosis of unspecified deep veins of left lower extremity: Secondary | ICD-10-CM | POA: Diagnosis not present

## 2019-11-06 DIAGNOSIS — F419 Anxiety disorder, unspecified: Secondary | ICD-10-CM | POA: Diagnosis present

## 2019-11-06 DIAGNOSIS — E861 Hypovolemia: Secondary | ICD-10-CM | POA: Diagnosis not present

## 2019-11-06 DIAGNOSIS — I82409 Acute embolism and thrombosis of unspecified deep veins of unspecified lower extremity: Secondary | ICD-10-CM

## 2019-11-06 DIAGNOSIS — Z96651 Presence of right artificial knee joint: Secondary | ICD-10-CM | POA: Diagnosis present

## 2019-11-06 DIAGNOSIS — Z79899 Other long term (current) drug therapy: Secondary | ICD-10-CM

## 2019-11-06 DIAGNOSIS — D638 Anemia in other chronic diseases classified elsewhere: Secondary | ICD-10-CM

## 2019-11-06 DIAGNOSIS — S93402A Sprain of unspecified ligament of left ankle, initial encounter: Secondary | ICD-10-CM | POA: Diagnosis not present

## 2019-11-06 DIAGNOSIS — I082 Rheumatic disorders of both aortic and tricuspid valves: Secondary | ICD-10-CM | POA: Diagnosis present

## 2019-11-06 DIAGNOSIS — I361 Nonrheumatic tricuspid (valve) insufficiency: Secondary | ICD-10-CM | POA: Diagnosis not present

## 2019-11-06 DIAGNOSIS — Z87440 Personal history of urinary (tract) infections: Secondary | ICD-10-CM | POA: Diagnosis not present

## 2019-11-06 DIAGNOSIS — R404 Transient alteration of awareness: Secondary | ICD-10-CM | POA: Diagnosis not present

## 2019-11-06 DIAGNOSIS — R339 Retention of urine, unspecified: Secondary | ICD-10-CM | POA: Diagnosis present

## 2019-11-06 DIAGNOSIS — R4182 Altered mental status, unspecified: Secondary | ICD-10-CM | POA: Diagnosis not present

## 2019-11-06 DIAGNOSIS — M19031 Primary osteoarthritis, right wrist: Secondary | ICD-10-CM | POA: Diagnosis not present

## 2019-11-06 DIAGNOSIS — R52 Pain, unspecified: Secondary | ICD-10-CM | POA: Diagnosis not present

## 2019-11-06 DIAGNOSIS — Z841 Family history of disorders of kidney and ureter: Secondary | ICD-10-CM

## 2019-11-06 DIAGNOSIS — S0990XA Unspecified injury of head, initial encounter: Secondary | ICD-10-CM | POA: Diagnosis not present

## 2019-11-06 DIAGNOSIS — R739 Hyperglycemia, unspecified: Secondary | ICD-10-CM | POA: Diagnosis present

## 2019-11-06 DIAGNOSIS — G928 Other toxic encephalopathy: Secondary | ICD-10-CM | POA: Diagnosis present

## 2019-11-06 LAB — CBC WITH DIFFERENTIAL/PLATELET
Abs Immature Granulocytes: 0.06 10*3/uL (ref 0.00–0.07)
Basophils Absolute: 0 10*3/uL (ref 0.0–0.1)
Basophils Relative: 0 %
Eosinophils Absolute: 0 10*3/uL (ref 0.0–0.5)
Eosinophils Relative: 0 %
HCT: 31.7 % — ABNORMAL LOW (ref 36.0–46.0)
Hemoglobin: 10.2 g/dL — ABNORMAL LOW (ref 12.0–15.0)
Immature Granulocytes: 1 %
Lymphocytes Relative: 8 %
Lymphs Abs: 1 10*3/uL (ref 0.7–4.0)
MCH: 29.9 pg (ref 26.0–34.0)
MCHC: 32.2 g/dL (ref 30.0–36.0)
MCV: 93 fL (ref 80.0–100.0)
Monocytes Absolute: 1.3 10*3/uL — ABNORMAL HIGH (ref 0.1–1.0)
Monocytes Relative: 12 %
Neutro Abs: 9.2 10*3/uL — ABNORMAL HIGH (ref 1.7–7.7)
Neutrophils Relative %: 79 %
Platelets: 263 10*3/uL (ref 150–400)
RBC: 3.41 MIL/uL — ABNORMAL LOW (ref 3.87–5.11)
RDW: 14.6 % (ref 11.5–15.5)
WBC: 11.5 10*3/uL — ABNORMAL HIGH (ref 4.0–10.5)
nRBC: 0 % (ref 0.0–0.2)

## 2019-11-06 LAB — COMPREHENSIVE METABOLIC PANEL
ALT: 20 U/L (ref 0–44)
AST: 21 U/L (ref 15–41)
Albumin: 2.5 g/dL — ABNORMAL LOW (ref 3.5–5.0)
Alkaline Phosphatase: 47 U/L (ref 38–126)
Anion gap: 8 (ref 5–15)
BUN: 10 mg/dL (ref 8–23)
CO2: 28 mmol/L (ref 22–32)
Calcium: 8.4 mg/dL — ABNORMAL LOW (ref 8.9–10.3)
Chloride: 96 mmol/L — ABNORMAL LOW (ref 98–111)
Creatinine, Ser: 0.52 mg/dL (ref 0.44–1.00)
GFR calc Af Amer: >60 mL/min (ref >60)
GFR calc non Af Amer: >60 mL/min (ref >60)
Glucose, Bld: 190 mg/dL — ABNORMAL HIGH (ref 70–99)
Potassium: 3 mmol/L — ABNORMAL LOW (ref 3.5–5.1)
Sodium: 132 mmol/L — ABNORMAL LOW (ref 135–145)
Total Bilirubin: 0.5 mg/dL (ref 0.3–1.2)
Total Protein: 6.1 g/dL — ABNORMAL LOW (ref 6.5–8.1)

## 2019-11-06 LAB — URINALYSIS, ROUTINE W REFLEX MICROSCOPIC
Bacteria, UA: NONE SEEN
Bilirubin Urine: NEGATIVE
Glucose, UA: NEGATIVE mg/dL
Hgb urine dipstick: NEGATIVE
Ketones, ur: NEGATIVE mg/dL
Leukocytes,Ua: NEGATIVE
Nitrite: NEGATIVE
Protein, ur: 30 mg/dL — AB
Specific Gravity, Urine: 1.039 — ABNORMAL HIGH (ref 1.005–1.030)
pH: 5 (ref 5.0–8.0)

## 2019-11-06 LAB — GLUCOSE, CAPILLARY: Glucose-Capillary: 112 mg/dL — ABNORMAL HIGH (ref 70–99)

## 2019-11-06 LAB — LACTIC ACID, PLASMA
Lactic Acid, Venous: 1.3 mmol/L (ref 0.5–1.9)
Lactic Acid, Venous: 1 mmol/L (ref 0.5–1.9)

## 2019-11-06 LAB — TROPONIN I (HIGH SENSITIVITY)
Troponin I (High Sensitivity): 16 ng/L (ref <18)
Troponin I (High Sensitivity): 34 ng/L — ABNORMAL HIGH (ref <18)

## 2019-11-06 LAB — SARS CORONAVIRUS 2 BY RT PCR (HOSPITAL ORDER, PERFORMED IN ~~LOC~~ HOSPITAL LAB): SARS Coronavirus 2: NEGATIVE

## 2019-11-06 LAB — CBG MONITORING, ED: Glucose-Capillary: 180 mg/dL — ABNORMAL HIGH (ref 70–99)

## 2019-11-06 LAB — MAGNESIUM: Magnesium: 1.4 mg/dL — ABNORMAL LOW (ref 1.7–2.4)

## 2019-11-06 MED ORDER — POTASSIUM CHLORIDE 10 MEQ/100ML IV SOLN
10.0000 meq | INTRAVENOUS | Status: AC
  Administered 2019-11-06 – 2019-11-07 (×4): 10 meq via INTRAVENOUS
  Filled 2019-11-06 (×4): qty 100

## 2019-11-06 MED ORDER — VANCOMYCIN HCL 1500 MG/300ML IV SOLN
1500.0000 mg | Freq: Once | INTRAVENOUS | Status: DC
  Filled 2019-11-06: qty 300

## 2019-11-06 MED ORDER — DICYCLOMINE HCL 10 MG PO CAPS
10.0000 mg | ORAL_CAPSULE | Freq: Three times a day (TID) | ORAL | Status: DC | PRN
  Administered 2019-11-08: 10 mg via ORAL
  Filled 2019-11-06 (×2): qty 1

## 2019-11-06 MED ORDER — LORATADINE 10 MG PO TABS
10.0000 mg | ORAL_TABLET | Freq: Every day | ORAL | Status: DC
  Administered 2019-11-07 – 2019-11-16 (×10): 10 mg via ORAL
  Filled 2019-11-06 (×10): qty 1

## 2019-11-06 MED ORDER — ONDANSETRON HCL 4 MG PO TABS: 4.0000 mg | ORAL_TABLET | Freq: Four times a day (QID) | ORAL | Status: DC | PRN

## 2019-11-06 MED ORDER — HEPARIN BOLUS VIA INFUSION
5000.0000 [IU] | Freq: Once | INTRAVENOUS | Status: AC
  Administered 2019-11-06: 5000 [IU] via INTRAVENOUS
  Filled 2019-11-06: qty 5000

## 2019-11-06 MED ORDER — ALPRAZOLAM 0.5 MG PO TABS
0.5000 mg | ORAL_TABLET | Freq: Every evening | ORAL | Status: DC | PRN
  Administered 2019-11-06 – 2019-11-15 (×9): 0.5 mg via ORAL
  Filled 2019-11-06 (×9): qty 1

## 2019-11-06 MED ORDER — POLYETHYLENE GLYCOL 3350 17 G PO PACK
17.0000 g | PACK | Freq: Two times a day (BID) | ORAL | Status: DC
  Filled 2019-11-06 (×11): qty 1

## 2019-11-06 MED ORDER — ACETAMINOPHEN 325 MG PO TABS
650.0000 mg | ORAL_TABLET | Freq: Four times a day (QID) | ORAL | Status: DC | PRN
  Administered 2019-11-07 – 2019-11-11 (×5): 650 mg via ORAL
  Filled 2019-11-06 (×5): qty 2

## 2019-11-06 MED ORDER — MOMETASONE FURO-FORMOTEROL FUM 200-5 MCG/ACT IN AERO
2.0000 | INHALATION_SPRAY | Freq: Two times a day (BID) | RESPIRATORY_TRACT | Status: DC
  Filled 2019-11-06: qty 8.8

## 2019-11-06 MED ORDER — DOCUSATE SODIUM 100 MG PO CAPS
100.0000 mg | ORAL_CAPSULE | Freq: Two times a day (BID) | ORAL | Status: DC
  Administered 2019-11-06 – 2019-11-14 (×3): 100 mg via ORAL
  Filled 2019-11-06 (×13): qty 1

## 2019-11-06 MED ORDER — ACETAMINOPHEN 500 MG PO TABS
1000.0000 mg | ORAL_TABLET | Freq: Once | ORAL | Status: AC
  Administered 2019-11-06: 1000 mg via ORAL
  Filled 2019-11-06: qty 2

## 2019-11-06 MED ORDER — IOHEXOL 350 MG/ML SOLN
75.0000 mL | Freq: Once | INTRAVENOUS | Status: AC | PRN
  Administered 2019-11-06: 75 mL via INTRAVENOUS

## 2019-11-06 MED ORDER — ALBUTEROL SULFATE HFA 108 (90 BASE) MCG/ACT IN AERS: 1.0000 | INHALATION_SPRAY | Freq: Four times a day (QID) | RESPIRATORY_TRACT | Status: DC | PRN

## 2019-11-06 MED ORDER — SODIUM CHLORIDE 0.9 % IV BOLUS
1000.0000 mL | Freq: Once | INTRAVENOUS | Status: AC
  Administered 2019-11-06: 1000 mL via INTRAVENOUS

## 2019-11-06 MED ORDER — SODIUM CHLORIDE 0.9 % IV SOLN
1.0000 g | Freq: Once | INTRAVENOUS | Status: AC
  Administered 2019-11-06: 1 g via INTRAVENOUS
  Filled 2019-11-06: qty 10

## 2019-11-06 MED ORDER — OXYCODONE HCL 5 MG PO TABS
5.0000 mg | ORAL_TABLET | Freq: Four times a day (QID) | ORAL | Status: AC | PRN
  Administered 2019-11-06 – 2019-11-07 (×2): 5 mg via ORAL
  Filled 2019-11-06 (×3): qty 1

## 2019-11-06 MED ORDER — SODIUM CHLORIDE 0.9 % IV BOLUS
1300.0000 mL | Freq: Once | INTRAVENOUS | Status: AC
  Administered 2019-11-06: 1300 mL via INTRAVENOUS

## 2019-11-06 MED ORDER — BISACODYL 10 MG RE SUPP: 10.0000 mg | Freq: Every day | RECTAL | Status: DC | PRN

## 2019-11-06 MED ORDER — ADULT MULTIVITAMIN W/MINERALS CH
1.0000 | ORAL_TABLET | Freq: Every day | ORAL | Status: DC
  Administered 2019-11-06 – 2019-11-16 (×11): 1 via ORAL
  Filled 2019-11-06 (×12): qty 1

## 2019-11-06 MED ORDER — SODIUM CHLORIDE 0.9 % IV SOLN: INTRAVENOUS | Status: DC

## 2019-11-06 MED ORDER — METHOCARBAMOL 500 MG PO TABS
500.0000 mg | ORAL_TABLET | Freq: Four times a day (QID) | ORAL | Status: DC | PRN
  Administered 2019-11-11 – 2019-11-15 (×5): 500 mg via ORAL
  Filled 2019-11-06 (×5): qty 1

## 2019-11-06 MED ORDER — HEPARIN (PORCINE) 25000 UT/250ML-% IV SOLN
1950.0000 [IU]/h | INTRAVENOUS | Status: DC
  Administered 2019-11-06: 1250 [IU]/h via INTRAVENOUS
  Administered 2019-11-07: 1600 [IU]/h via INTRAVENOUS
  Administered 2019-11-08: 1950 [IU]/h via INTRAVENOUS
  Filled 2019-11-06 (×3): qty 250

## 2019-11-06 MED ORDER — MONTELUKAST SODIUM 10 MG PO TABS
10.0000 mg | ORAL_TABLET | Freq: Every day | ORAL | Status: DC
  Administered 2019-11-06 – 2019-11-15 (×10): 10 mg via ORAL
  Filled 2019-11-06 (×10): qty 1

## 2019-11-06 MED ORDER — ALBUTEROL SULFATE (2.5 MG/3ML) 0.083% IN NEBU: 2.5000 mg | INHALATION_SOLUTION | Freq: Four times a day (QID) | RESPIRATORY_TRACT | Status: DC | PRN

## 2019-11-06 MED ORDER — ACETAMINOPHEN 650 MG RE SUPP: 650.0000 mg | Freq: Four times a day (QID) | RECTAL | Status: DC | PRN

## 2019-11-06 MED ORDER — ONDANSETRON HCL 4 MG/2ML IJ SOLN
4.0000 mg | Freq: Four times a day (QID) | INTRAMUSCULAR | Status: DC | PRN
  Administered 2019-11-10 – 2019-11-15 (×2): 4 mg via INTRAVENOUS
  Filled 2019-11-06 (×2): qty 2

## 2019-11-06 MED ORDER — MELATONIN 3 MG PO TABS
3.0000 mg | ORAL_TABLET | Freq: Every evening | ORAL | Status: DC | PRN
  Administered 2019-11-06 – 2019-11-12 (×6): 3 mg via ORAL
  Filled 2019-11-06 (×6): qty 1

## 2019-11-06 MED ORDER — SENNOSIDES-DOCUSATE SODIUM 8.6-50 MG PO TABS: 1.0000 | ORAL_TABLET | Freq: Every evening | ORAL | Status: DC | PRN

## 2019-11-06 MED ORDER — LEVOCETIRIZINE DIHYDROCHLORIDE 5 MG PO TABS: 5.0000 mg | ORAL_TABLET | Freq: Every evening | ORAL | Status: DC

## 2019-11-06 MED ORDER — MOMETASONE FURO-FORMOTEROL FUM 200-5 MCG/ACT IN AERO
2.0000 | INHALATION_SPRAY | Freq: Two times a day (BID) | RESPIRATORY_TRACT | Status: DC
  Administered 2019-11-07 – 2019-11-16 (×16): 2 via RESPIRATORY_TRACT
  Filled 2019-11-06: qty 8.8

## 2019-11-06 MED ORDER — BENZONATATE 100 MG PO CAPS: 100.0000 mg | ORAL_CAPSULE | Freq: Three times a day (TID) | ORAL | Status: DC | PRN
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
## 2019-11-06 NOTE — H&P (Signed)
History and Physical    Alexis Olson JYN:[id number] DOB: 12-Apr-1933 DOA: 11/06/2019  PCP: Mosie Lukes, MD Patient coming from: home   Chief Complaint: altered mental status / lethargy   HPI: 86 y.o. female with history of HTN, GERD, diverticulosis, osteoporosis, B12 deficiency, HLD, bariatric surgery, anxiety disorder, frequently recurrent UTIs, and chronic low back pain who suffered an accidental fall at home several days prior to this presentation resulting in pain in the left foot and ankle.  Since that time the patient's family has noted she has progressively become more confused and lethargic.  She presented to the ER with the family suspecting she had another of her recurrent UTIs.  In the ED an x-ray of the ankle revealed no evidence of a fracture but venous duplex of the lower extremity did reveal an acute DVT of the left lower extremity.  CT angio was negative for PE or pulmonary infiltrate.  CT head and cervical spine were without acute findings.  During her stay in the ER the patient has had some urinary retention and required an I&O cath.  She has also been noted to have a low-grade elevated temperature at 100.8 rectal.  Assessment/Plan  Toxic metabolic encephalopathy -altered mental status of unclear etiology UA not consistent with UTI -CT imaging chest without evidence of pneumonia -Covid negative -the patient is on multiple potential sedating medications and perhaps this is due to polypharmacy -I see no convincing evidence of an active infection at this time -her low-grade temperature elevation may simply be due to her DVT/inflammatory state -I will not continue antibiotics started in the ED for now and we will monitor to see if it infection declares itself -we will complete a metabolic work-up with repeat labs in the morning -family does report that since her fall and injury she has done little more than laying in the bed and not eaten or drank much and therefore this could  simply be a consequence of dehydration  Acute left lower extremity DVT Likely a consequence of injuring her ankle and then remaining largely immobile for 3 to 5 days -full dose IV heparin for now -likely can transition to oral anticoagulant tomorrow if she is more alert and able to consistently take oral medications  Leukocytosis WBC 11.5 at presentation -no frank evidence of infection at present -hold further antibiotics and monitor  SIRS Patient meets SIRS criteria with elevated temperature and tachycardia but I do not see convincing evidence of an active infection and therefore I do not yet feel it is accurate to call the sepsis -we will monitor as discussed above  Normocytic anemia Hemoglobin 10.2 at presentation with a normal MCV -no evidence of acute gross blood loss -check anemia panel  Mild hyponatremia Sodium 132 at presentation -likely reflective of hypovolemic state -hydrate with IV fluid given poor tolerance of oral intake/lethargy and follow  Hypokalemia Potassium 3.0 at presentation -likely due to limited intake -supplement via IV -check magnesium -follow-up in a.m.  Hyperglycemia No reported history of DM2 but serum glucose 190 at presentation -check A1c -monitor CBGs for now without insulin correction  Sinus tachycardia Heart rate elevated as high as 118 during stay in the ED -likely simply reflective of dehydration -hydrate and follow -no evidence of PE on CT angio chest  Holosystolic murmur Records indicate a history of rheumatic fever but no further details available -I do not find evidence of the previously documented murmur and cursory review of outpatient records -I will check a TTE to  evaluate for valvular abnormalities  DVT prophylaxis: Fully anticoagulated with IV heparin Code Status: Full Family Communication: Spoke with husband at bedside at length Disposition Plan: Admit to telemetry bed Consults called: None necessary  Review of Systems: As per HPI  otherwise 10 point review of systems negative.   Past Medical History:  Diagnosis Date  . ALLERGIC RHINITIS   . ANEMIA, CHRONIC    Malabsorption related to bypass hx, B12 and iron deficiency  . ANXIETY   . Arthritis   . ASTHMA   . Bariatric surgery status   . Bowel perforation ([place]) 2016  . C. difficile diarrhea   . Colon perforation ([place]) 08/2014   following polypetomy during colo, surgical repair  . COLONIC POLYPS, ADENOMATOUS, HX OF 2010  . DIVERTICULOSIS, COLON   . DVT (deep venous thrombosis) ([place]) 2018  . DVT (deep venous thrombosis) ([place])   . GERD (gastroesophageal reflux disease) 08/23/2015  . Graded compression stocking in place   . History of blood transfusion   . History of migraine   . History of rheumatic fever   . HSV (herpes simplex virus) infection 08/16/2015  . Hyperlipidemia   . Hypertension    Denies  . Hyponatremia 05/02/2016  . Incomplete left bundle branch block (LBBB) 2016   Noted on EKG  . INSOMNIA   . Lumbosacral disc disease    Chronic pain; History of osteomyelitis 2010 following ESI complication  . Obesity   . Osteomyelitis ([place])   . Pelvic fracture ([place])   . Pneumonia   . Recurrent UTI 08/23/2015  . URINARY URGENCY   . Ventral hernia without obstruction or gangrene 05/22/2018  . VITAMIN B12 DEFICIENCY     Past Surgical History:  Procedure Laterality Date  . [place]   for ruptured disc  . CHOLECYSTECTOMY    . COLONOSCOPY    . greenfield filter    . ileojejunal bypass  1976   for obesity  . LAPAROTOMY N/A 09/18/2014   Procedure: EXPLORATORY LAPAROTOMY WITH REPAIR OF CECIAL PERFORATION;  Surgeon: Autumn Messing III, MD;  Location: [facility];  Service: General;  Laterality: N/A;  . TONSILLECTOMY    . TOTAL KNEE ARTHROPLASTY Right 08/26/2019   Procedure: RIGHT TOTAL KNEE ARTHROPLASTY, LEFT KNEE CORTISONE INJECTION;  Surgeon: Paralee Cancel, MD;  Location: [facility];  Service: Orthopedics;  Laterality: Right;  70 MINS  . UPPER GI ENDOSCOPY    .  VASCULAR SURGERY Right    revascularization right lower extremity    Family History  Family History  Problem Relation Age of Onset  . Cervical cancer Mother   . Stroke Mother   . Liver disease Sister   . Kidney disease Sister   . Cirrhosis Sister   . Colon cancer Neg Hx   . Esophageal cancer Neg Hx   . Rectal cancer Neg Hx   . Stomach cancer Neg Hx     Social History   reports that she has never smoked. She has never used smokeless tobacco. She reports that she does not drink alcohol and does not use drugs.  Allergies Allergies  Allergen Reactions  . Ativan [Lorazepam] Anaphylaxis and Other (See Comments)    Deathly allergic; "resp rate dropped dropped to 6"  . Other Anaphylaxis    Red peppers--choking, also  . Azithromycin Diarrhea  . Benadryl [Diphenhydramine Hcl] Other (See Comments)    Paradoxical response  . Ciprofloxacin Hcl Other (See Comments)    Causes yeast infection and refuses to take  .  Codeine Phosphate Nausea Only    Can tolerate in limited amounts  . Hydrocodone Itching and Nausea And Vomiting  . Hydrocodone-Homatropine Itching and Nausea And Vomiting  . Levaquin [Levofloxacin In D5w] Other (See Comments)    Muscle soreness  . Levofloxacin   . Meperidine Hcl Other (See Comments)    Reaction ??  . Molds & Smuts Other (See Comments)    unsure  . Sulfa Antibiotics Other (See Comments)    Joint pain  . Tape Other (See Comments)    No Coban wrap (per the patient)  . Ultram [Tramadol Hcl] Other (See Comments)    "Can't move joints"    Prior to Admission medications   Medication Sig Start Date End Date Taking? Authorizing Provider  acetaminophen (TYLENOL) 500 MG tablet Take 2 tablets (1,000 mg total) by mouth every 8 (eight) hours. 08/27/19   Maurice March, PA-C  albuterol (PROVENTIL HFA;VENTOLIN HFA) 108 (90 BASE) MCG/ACT inhaler Inhale 1-2 puffs into the lungs every 6 (six) hours as needed for wheezing or shortness of breath.    [provider]  ALPRAZolam (XANAX) 1 MG tablet TAKE 1/2 TO 1 TABLET BY MOUTH AT BEDTIME AS NEEDED Patient taking differently: Take 0.5 mg by mouth at bedtime as needed for anxiety or sleep.  03/10/19   Mosie Lukes, MD  amitriptyline (ELAVIL) 100 MG tablet Take 1 tablet (100 mg total) by mouth daily. 09/17/19   Mosie Lukes, MD  azelastine (OPTIVAR) 0.05 % ophthalmic solution Place 1 drop into both eyes daily as needed (for dry eyes).     [provider]  benzonatate (TESSALON) 100 MG capsule Take 1 capsule (100 mg total) by mouth 3 (three) times daily as needed for cough. 04/01/18   Saguier, Percell Miller, PA-C  budesonide-formoterol (SYMBICORT) 160-4.5 MCG/ACT inhaler Inhale 2 puffs into the lungs See admin instructions. Inhale 2 puffs into the lungs one to two times a day Patient taking differently: Inhale 2 puffs into the lungs in the morning and at bedtime. Inhale 2 puffs into the lungs one to two times a day 03/04/18   Saguier, Percell Miller, PA-C  cetirizine (ZYRTEC) 10 MG tablet Take 10 mg by mouth daily.  02/05/14   [provider]  cyanocobalamin (,VITAMIN B-12,) 1000 MCG/ML injection Inject 1cc into the muscle every month 10/08/19   Gatha Mayer, MD  dicyclomine (BENTYL) 10 MG capsule Take 1 capsule (10 mg total) by mouth 3 (three) times daily as needed (for diarrhea & symptoms). 12/05/18   Gatha Mayer, MD  diphenoxylate-atropine (LOMOTIL) 2.5-0.025 MG tablet Take 2 tablets 3 times a day as needed for diarrhea 09/17/19   Gatha Mayer, MD  docusate sodium (COLACE) 100 MG capsule Take 1 capsule (100 mg total) by mouth 2 (two) times daily. 08/27/19   Griffith Citron R, PA-C  Fe Fum-FA-B Cmp-C-Zn-Mg-Mn-Cu (HEMOCYTE PLUS) 106-1 MG CAPS TAKE 1 CAPSULE BY MOUTH 2 TIMES DAILY. 09/02/19   Gatha Mayer, MD  ferrous sulfate (FERROUSUL) 325 (65 FE) MG tablet Take 1 tablet (325 mg total) by mouth 3 (three) times daily with meals for 14 days. 08/27/19 09/10/19  Maurice March, PA-C    fluticasone (FLONASE) 50 MCG/ACT nasal spray 2 sprays by Nasal route daily.      [provider]  gabapentin (NEURONTIN) 300 MG capsule TAKE 1 CAPSULE BY MOUTH AT BEDTIME Patient taking differently: Take 300 mg by mouth at bedtime.  08/04/19   Mosie Lukes, MD  hydrOXYzine (  ATARAX) 25 MG tablet Take 25 mg by mouth 3 (three) times daily as needed for itching.     [provider]  levocetirizine (XYZAL) 5 MG tablet Take 1 tablet (5 mg total) by mouth every evening. 08/04/14   Rowe Clack, MD  lubiprostone (AMITIZA) 24 MCG capsule Take 24 mcg by mouth daily as needed for constipation.    [provider]  magnesium oxide (MAG-OX 400) 400 MG tablet Take 400 mg by mouth 2 (two) times daily.     [provider]  methocarbamol (ROBAXIN) 500 MG tablet Take 1 tablet (500 mg total) by mouth every 6 (six) hours as needed for muscle spasms. 08/27/19   Maurice March, PA-C  montelukast (SINGULAIR) 10 MG tablet Take 10 mg by mouth at bedtime.  02/11/14   [provider]  omeprazole (PRILOSEC) 20 MG capsule TAKE 1 CAPSULE BY MOUTH DAILY. Patient taking differently: Take 20 mg by mouth daily.  06/16/19   Gatha Mayer, MD  ondansetron (ZOFRAN-ODT) 4 MG disintegrating tablet Take 1 tablet (4 mg total) by mouth 3 (three) times daily as needed for nausea or vomiting. 08/27/19   Maurice March, PA-C  oxyCODONE (OXY IR/ROXICODONE) 5 MG immediate release tablet Take 1-2 tablets (5-10 mg total) by mouth every 4 (four) hours as needed for moderate pain or severe pain. 08/27/19   Maurice March, PA-C  phenazopyridine (PYRIDIUM) 200 MG tablet Take 200 mg by mouth 3 (three) times daily as needed for pain.     [provider]  polyethylene glycol (MIRALAX / GLYCOLAX) 17 g packet Take 17 g by mouth 2 (two) times daily. 08/27/19   Maurice March, PA-C  potassium chloride SA (KLOR-CON) 20 MEQ tablet TAKE 1 TABLET BY MOUTH ONCE DAILY 09/12/19   Mosie Lukes, MD   Probiotic Product (PROBIOTIC DAILY PO) Take 1 capsule by mouth daily.    [provider]  rivaroxaban (XARELTO) 10 MG TABS tablet Take 1 tablet (10 mg total) by mouth daily for 14 days. 08/28/19 09/11/19  Maurice March, PA-C  valACYclovir (VALTREX) 500 MG tablet TAKE 1 TABLET (500 MG TOTAL) BY MOUTH AS NEEDED. 09/12/18   Mosie Lukes, MD  Vitamin D, Ergocalciferol, (DRISDOL) 1.25 MG (50000 UNIT) CAPS capsule TAKE 1 CAPSULE (50,000 UNITS TOTAL) BY MOUTH EVERY 7 (SEVEN) DAYS. 09/25/19   Mosie Lukes, MD  vitamin E 400 UNIT capsule Take 400 Units by mouth daily.      [provider]  zolpidem (AMBIEN) 5 MG tablet TAKE 1 TABLET BY MOUTH AT BEDTIME 10/08/19   Mosie Lukes, MD    Physical Exam: Vitals:   11/06/19 1205 11/06/19 1206 11/06/19 1209 11/06/19 1343  BP:   133/64   Pulse: (!) 118  (!) 118   Resp: 19  18   Temp:   98.8 F (37.1 C) (!) 100.8 F (38.2 C)  TempSrc:   Oral Rectal  SpO2: 92%  94%   Weight:  79.8 kg    Height:  5\' 5"  (1.651 m)      Constitutional: Lethargic but wakes up with stimulation and able to follow simple commands before falling back asleep Eyes: PERRL, lids and conjunctivae normal ENMT: Mucous membranes are dry.  Neck: normal, supple, no masses, no thyromegaly Respiratory: clear to auscultation bilaterally, no wheezing, no crackles. Normal respiratory effort. No accessory muscle use.  Cardiovascular: Tachycardic but regular with 2/6 holosystolic murmur without rub Abdomen: no tenderness, no masses palpated.  No hepatosplenomegaly. Bowel sounds positive.  Musculoskeletal: Left ankle and foot mildly erythematous and edematous -no edema of right lower extremity Skin: no rashes, lesions, ulcers. No induration Neurologic: CN 2-12 grossly intact.  Labs on Admission:   CBC: Recent Labs  Lab 11/06/19 1217  WBC 11.5*  NEUTROABS 9.2*  HGB 10.2*  HCT 31.7*  MCV 93.0  PLT 220   Basic Metabolic Panel: Recent Labs  Lab 11/06/19 1217   NA 132*  K 3.0*  CL 96*  CO2 28  GLUCOSE 190*  BUN 10  CREATININE 0.52  CALCIUM 8.4*   GFR: Estimated Creatinine Clearance: 54.6 mL/min (by C-G formula based on SCr of 0.52 mg/dL). Liver Function Tests: Recent Labs  Lab 11/06/19 1217  AST 21  ALT 20  ALKPHOS 47  BILITOT 0.5  PROT 6.1*  ALBUMIN 2.5*   CBG: Recent Labs  Lab 11/06/19 1223  GLUCAP 180*   Urine analysis:    Component Value Date/Time   COLORURINE YELLOW 11/06/2019 [place] 11/06/2019 1638   LABSPEC 1.039 (H) 11/06/2019 1638   PHURINE 5.0 11/06/2019 1638   GLUCOSEU NEGATIVE 11/06/2019 1638   GLUCOSEU NEGATIVE 05/30/2019 1657   HGBUR NEGATIVE 11/06/2019 1638   HGBUR trace-lysed 02/03/2010 1109   BILIRUBINUR NEGATIVE 11/06/2019 1638   BILIRUBINUR Negative 12/03/2018 1519   KETONESUR NEGATIVE 11/06/2019 1638   PROTEINUR 30 (A) 11/06/2019 1638   UROBILINOGEN 0.2 05/30/2019 1657   NITRITE NEGATIVE 11/06/2019 1638   LEUKOCYTESUR NEGATIVE 11/06/2019 1638    Recent Results (from the past 240 hour(s))  SARS Coronavirus 2 by RT PCR (hospital order, performed in [place] hospital lab) Nasopharyngeal Nasopharyngeal Swab     Status: None   Collection Time: 11/06/19 12:21 PM   Specimen: Nasopharyngeal Swab  Result Value Ref Range Status   SARS Coronavirus 2 NEGATIVE NEGATIVE Final    Comment: (NOTE) SARS-CoV-2 target nucleic acids are NOT DETECTED.  The SARS-CoV-2 RNA is generally detectable in upper and lower respiratory specimens during the acute phase of infection. The lowest concentration of SARS-CoV-2 viral copies this assay can detect is 250 copies / mL. A negative result does not preclude SARS-CoV-2 infection and should not be used as the sole basis for treatment or other patient management decisions.  A negative result may occur with improper specimen collection / handling, submission of specimen other than nasopharyngeal swab, presence of viral mutation(s) within the areas  targeted by this assay, and inadequate number of viral copies (<250 copies / mL). A negative result must be combined with clinical observations, patient history, and epidemiological information.  Fact Sheet for Patients:   StrictlyIdeas.no  Fact Sheet for Healthcare Providers: BankingDealers.co.za  This test is not yet approved or  cleared by the [place] FDA and has been authorized for detection and/or diagnosis of SARS-CoV-2 by FDA under an Emergency Use Authorization (EUA).  This EUA will remain in effect (meaning this test can be used) for the duration of the COVID-19 declaration under Section 564(b)(1) of the Act, 21 U.S.C. section 360bbb-3(b)(1), unless the authorization is terminated or revoked sooner.  Performed at [facility], [place] [address], [place], [place] [address]      Radiological Exams on Admission: DG Chest 2 View  Result Date: 11/06/2019 CLINICAL DATA:  Altered mental status.  Recent fall. EXAM: CHEST - 2 VIEW COMPARISON:  Chest x-ray dated November 24, 2014. FINDINGS: The heart size and mediastinal contours are within normal limits. Both lungs are clear. The visualized  skeletal structures are unremarkable. IMPRESSION: No active cardiopulmonary disease. Electronically Signed   By: Titus Dubin M.D.   On: 11/06/2019 13:16   DG Shoulder Right  Result Date: 11/06/2019 CLINICAL DATA:  Right shoulder pain.  Recent fall. EXAM: RIGHT SHOULDER - 2+ VIEW COMPARISON:  None. FINDINGS: No acute fracture or dislocation. Moderate acromioclavicular and early glenohumeral osteoarthritis. Soft tissues are unremarkable. IMPRESSION: 1. No acute osseous abnormality.  Degenerative changes. Electronically Signed   By: Titus Dubin M.D.   On: 11/06/2019 13:21   DG Ankle Complete Left  Result Date: 11/06/2019 CLINICAL DATA:  Left ankle pain.  Recent fall. EXAM: LEFT ANKLE COMPLETE - 3+ VIEW COMPARISON:  None. FINDINGS: No acute  fracture or dislocation. The ankle mortise is symmetric. The talar dome is intact. No tibiotalar joint effusion. Osteopenia. Diffuse soft tissue swelling. IMPRESSION: 1. Diffuse soft tissue swelling. No acute osseous abnormality. Electronically Signed   By: Titus Dubin M.D.   On: 11/06/2019 13:19   CT Head Wo Contrast  Result Date: 11/06/2019 CLINICAL DATA:  Head trauma, moderate/severe. Neck trauma. Generalized weakness. Fall on Saturday. EXAM: CT HEAD WITHOUT CONTRAST CT CERVICAL SPINE WITHOUT CONTRAST TECHNIQUE: Multidetector CT imaging of the head and cervical spine was performed following the standard protocol without intravenous contrast. Multiplanar CT image reconstructions of the cervical spine were also generated. COMPARISON:  No pertinent prior exams are available for comparison. FINDINGS: CT HEAD FINDINGS Brain: Mild generalized parenchymal atrophy. There is no acute intracranial hemorrhage. No demarcated cortical infarct. No extra-axial fluid collection. No evidence of intracranial mass. No midline shift. Vascular: No hyperdense vessel. Atherosclerotic calcifications. Skull: Normal. Negative for fracture or focal lesion. Sinuses/Orbits: Visualized orbits show no acute finding. Mild ethmoid and right max sinus mucosal thickening. Partially imaged left maxillary sinus mucous retention cyst. No significant mastoid effusion CT CERVICAL SPINE FINDINGS Alignment: Straightening of the expected cervical lordosis. Trace C3-C4 and C4-C5 grade 1 anterolisthesis. Rightward rotation of C1 upon C2 may be related to patient head positioning at the time of examination. Skull base and vertebrae: The basion-dental and atlanto-dental intervals are maintained.No evidence of acute fracture to the cervical spine. The bones are diffusely demineralized. Soft tissues and spinal canal: No prevertebral fluid or swelling. No visible canal hematoma. Disc levels: Cervical spondylosis with multilevel disc space narrowing,  posterior disc osteophytes, uncovertebral and facet hypertrophy. Disc space narrowing is advanced throughout the cervical spine. No high-grade bony spinal canal stenosis. Multilevel neural foraminal narrowing. Also of note, there is ligamentous hypertrophy/pannus formation posterior to the dens. Upper chest: No consolidation within the imaged lung apices. No visible pneumothorax. IMPRESSION: CT head: 1. No evidence of acute intracranial abnormality. 2. Mild generalized parenchymal atrophy. 3. Mild ethmoid and right maxillary sinus mucosal thickening at the imaged levels. Partially imaged left maxillary sinus mucous retention cyst. CT cervical spine: 1. No evidence of acute fracture to the cervical spine. 2. Rightward rotation of C1 upon C2 may be related to patient head positioning at the time of examination. However, clinical correlation is recommended. 3. Mild C3-C4 and C4-C5 grade 1 anterolisthesis. 4. Cervical spondylosis as described. 5. Diffuse osseous demineralization. Electronically Signed   By: Kellie Simmering DO   On: 11/06/2019 14:51   CT Angio Chest PE W and/or Wo Contrast  Result Date: 11/06/2019 CLINICAL DATA:  Generalized weakness EXAM: CT ANGIOGRAPHY CHEST WITH CONTRAST TECHNIQUE: Multidetector CT imaging of the chest was performed using the standard protocol during bolus administration of intravenous contrast. Multiplanar CT image reconstructions and  MIPs were obtained to evaluate the vascular anatomy. CONTRAST:  43mL OMNIPAQUE IOHEXOL 350 MG/ML SOLN COMPARISON:  2016 FINDINGS: Cardiovascular: Satisfactory opacification of the pulmonary arteries to the proximal segmental level. No evidence of acute pulmonary embolism. Normal heart size. No pericardial effusion. Coronary artery and thoracic aortic calcification. Mediastinum/Nodes: No enlarged lymph nodes identified. The thyroid is unremarkable Lungs/Pleura: Bilateral dependent atelectatic changes. No pleural effusion or pneumothorax. Upper Abdomen:  No acute abnormality. Musculoskeletal: No acute osseous abnormality. Chronic loss of height at the inferior endplate of T7. Review of the MIP images confirms the above findings. IMPRESSION: No evidence of acute pulmonary embolism or other acute abnormality. Electronically Signed   By: Macy Mis M.D.   On: 11/06/2019 15:03   CT Cervical Spine Wo Contrast  Result Date: 11/06/2019 CLINICAL DATA:  Head trauma, moderate/severe. Neck trauma. Generalized weakness. Fall on Saturday. EXAM: CT HEAD WITHOUT CONTRAST CT CERVICAL SPINE WITHOUT CONTRAST TECHNIQUE: Multidetector CT imaging of the head and cervical spine was performed following the standard protocol without intravenous contrast. Multiplanar CT image reconstructions of the cervical spine were also generated. COMPARISON:  No pertinent prior exams are available for comparison. FINDINGS: CT HEAD FINDINGS Brain: Mild generalized parenchymal atrophy. There is no acute intracranial hemorrhage. No demarcated cortical infarct. No extra-axial fluid collection. No evidence of intracranial mass. No midline shift. Vascular: No hyperdense vessel. Atherosclerotic calcifications. Skull: Normal. Negative for fracture or focal lesion. Sinuses/Orbits: Visualized orbits show no acute finding. Mild ethmoid and right max sinus mucosal thickening. Partially imaged left maxillary sinus mucous retention cyst. No significant mastoid effusion CT CERVICAL SPINE FINDINGS Alignment: Straightening of the expected cervical lordosis. Trace C3-C4 and C4-C5 grade 1 anterolisthesis. Rightward rotation of C1 upon C2 may be related to patient head positioning at the time of examination. Skull base and vertebrae: The basion-dental and atlanto-dental intervals are maintained.No evidence of acute fracture to the cervical spine. The bones are diffusely demineralized. Soft tissues and spinal canal: No prevertebral fluid or swelling. No visible canal hematoma. Disc levels: Cervical spondylosis with  multilevel disc space narrowing, posterior disc osteophytes, uncovertebral and facet hypertrophy. Disc space narrowing is advanced throughout the cervical spine. No high-grade bony spinal canal stenosis. Multilevel neural foraminal narrowing. Also of note, there is ligamentous hypertrophy/pannus formation posterior to the dens. Upper chest: No consolidation within the imaged lung apices. No visible pneumothorax. IMPRESSION: CT head: 1. No evidence of acute intracranial abnormality. 2. Mild generalized parenchymal atrophy. 3. Mild ethmoid and right maxillary sinus mucosal thickening at the imaged levels. Partially imaged left maxillary sinus mucous retention cyst. CT cervical spine: 1. No evidence of acute fracture to the cervical spine. 2. Rightward rotation of C1 upon C2 may be related to patient head positioning at the time of examination. However, clinical correlation is recommended. 3. Mild C3-C4 and C4-C5 grade 1 anterolisthesis. 4. Cervical spondylosis as described. 5. Diffuse osseous demineralization. Electronically Signed   By: Kellie Simmering DO   On: 11/06/2019 14:51   VAS [place] LOWER EXTREMITY VENOUS (DVT) (ONLY [facility] & [facility])  Result Date: 11/06/2019  Lower Venous DVTStudy Indications: Swelling. Other Indications: History of DVT. Comparison Study: 03-08-2017 Prior RT- partial chronic at [facility] Performing Technologist: Darlin Coco  Examination Guidelines: A complete evaluation includes B-mode imaging, spectral Doppler, color Doppler, and power Doppler as needed of all accessible portions of each vessel. Bilateral testing is considered an integral part of a complete examination. Limited examinations for reoccurring indications may be performed as noted. The reflux portion of the  exam is performed with the patient in reverse Trendelenburg.  +---------+---------------+---------+-----------+----------+-------------------+ RIGHT    CompressibilityPhasicitySpontaneityPropertiesThrombus Aging       +---------+---------------+---------+-----------+----------+-------------------+ CFV      Full           Yes      Yes                  Significant fibrin                                                        stranding           +---------+---------------+---------+-----------+----------+-------------------+ SFJ      Full                                                             +---------+---------------+---------+-----------+----------+-------------------+ FV Prox  Full                                                             +---------+---------------+---------+-----------+----------+-------------------+ FV Mid   Full                                                             +---------+---------------+---------+-----------+----------+-------------------+ FV DistalFull                                                             +---------+---------------+---------+-----------+----------+-------------------+ PFV      Full                                                             +---------+---------------+---------+-----------+----------+-------------------+ POP      Full           Yes      Yes                                      +---------+---------------+---------+-----------+----------+-------------------+ PTV      Full                                                             +---------+---------------+---------+-----------+----------+-------------------+ PERO     Full                                                             +---------+---------------+---------+-----------+----------+-------------------+   +---------+---------------+---------+-----------+----------+-------------------+  LEFT     CompressibilityPhasicitySpontaneityPropertiesThrombus Aging      +---------+---------------+---------+-----------+----------+-------------------+ CFV      Full           Yes      Yes                  Significant fibrin                                                         stranding           +---------+---------------+---------+-----------+----------+-------------------+ SFJ      Full                                                             +---------+---------------+---------+-----------+----------+-------------------+ FV Prox  Full                                         Significant fibrin                                                        stranding           +---------+---------------+---------+-----------+----------+-------------------+ FV Mid   Full                                                             +---------+---------------+---------+-----------+----------+-------------------+ FV DistalFull                                                             +---------+---------------+---------+-----------+----------+-------------------+ PFV      Full                                         Fibrin stranding    +---------+---------------+---------+-----------+----------+-------------------+ POP      None           No       No                   Age Indeterminate   +---------+---------------+---------+-----------+----------+-------------------+ PTV      Full                                                             +---------+---------------+---------+-----------+----------+-------------------+  PERO     Full                                                             +---------+---------------+---------+-----------+----------+-------------------+    Summary: RIGHT: - There is no evidence of deep vein thrombosis in the lower extremity.  - No cystic structure found in the popliteal fossa.  LEFT: - Findings consistent with age indeterminate deep vein thrombosis involving the left popliteal vein. - A cystic structure is found in the popliteal fossa.  *See table(s) above for measurements and observations.    Preliminary     Cherene Altes, MD [facility] Office  [phone] Pager - Text Page per Amion as per below:  On-Call/Text Page:      [email or web address]  If 7PM-7AM, please contact night-coverage [email or web address] 11/06/2019, 5:28 PM

## 2019-11-06 NOTE — Progress Notes (Signed)
Lower extremity venous bilateral study completed  Preliminary results relayed to provider.  See CV Proc for preliminary results report.   Alexis Olson

## 2019-11-06 NOTE — ED Provider Notes (Signed)
Pt signed out to me by dr Gilford Raid at 350 pm.  Briefly 86 yo female presenting with a fall several days ago, complaining of left foot and ankle pain, with new onset confusion for several days.  Temp 100.58F here rectally.  Possible UTI here.  Xray of ankles show no acute fracture.  Diffuse swelling of b/l lower extremities with [place] pending.  CT PE negative.  No PNA in chest.  CTH and C-spine without acute findings.  Update 415 - nursing staff asked to perform in and out cath as patient has been unable to urinate.  Rocephin ordered for suspected UTI by Dr Gilford Raid.  Patient and family updated about DVT in LLE.  On my exam her HR is 110, BP stable, she appears tired, has symmetrical LE edema and ankle effusions, some posterior left leg ttp.  She is able to answer my questions appropriately and does not appear encephalopathic.    There is no history to indicate septic arthritis at this time.  Heparin ordered for DVT.  If bacterial source is confirmed she will be a code sepsis.    Lactate wnl, doubt severe sepsis or shock.  Update 5 pm - UA without evidence of infection.  No clear source at this time for fever.  It may be 2/2 her DVT.  I ordered empiric vancomycin to give her BS coverage here, the hospitalist can consider d/c antibiotics if blood cultures are negative.    Hospitalist paged for admission.   Wyvonnia Dusky, MD 11/06/19 1718

## 2019-11-06 NOTE — ED Provider Notes (Signed)
[facility] EMERGENCY DEPARTMENT Provider Note   CSN: [id number] Arrival date & time: 11/06/19  1201     History Chief Complaint  Patient presents with  . Foot Pain  . Weakness    Alexis Olson is a 86 y.o. female.  Pt presents to the ED today with left ankle and foot pain.  The pt said she fell on Saturday, 8/7.  She initially said it was ok, but it's gradually worsened and now she can't walk.  She said her family has been dragging her around and now she has neck and arm pain.  Pt was given 156mcg of fentanyl by EMS and 1500 cc by EMS en route.  The fentanyl has caused pt to all asleep during exam, and she is a poor historian.  EMS said pt had a fever this am.  She's been Covid vaccinated.  Additional hx from pt's daughter who is a Marine scientist.  She said pt was walking fine on Sat and Sun.  They took her to Dr. Alvan Dame on Monday to check her knees.  Things looked good.  Per daughter, pt had a sudden change in activity level on Monday.  She has not been eating and drinking.  She has not been walking.  She and others in the family have been trying to move her to and from the bathroom and it takes a very long time.  This is very abnormal for patient.        Past Medical History:  Diagnosis Date  . ALLERGIC RHINITIS   . ANEMIA, CHRONIC    Malabsorption related to bypass hx, B12 and iron deficiency  . ANXIETY   . Arthritis   . ASTHMA   . Bariatric surgery status   . Bowel perforation ([place]) 2016  . C. difficile diarrhea   . Colon perforation ([place]) 08/2014   following polypetomy during colo, surgical repair  . COLONIC POLYPS, ADENOMATOUS, HX OF 2010  . DIVERTICULOSIS, COLON   . DVT (deep venous thrombosis) ([place]) 2018  . DVT (deep venous thrombosis) ([place])   . GERD (gastroesophageal reflux disease) 08/23/2015  . Graded compression stocking in place   . History of blood transfusion   . History of migraine   . History of rheumatic fever   . HSV (herpes simplex virus)  infection 08/16/2015  . Hyperlipidemia   . Hypertension    Denies  . Hyponatremia 05/02/2016  . Incomplete left bundle branch block (LBBB) 2016   Noted on EKG  . INSOMNIA   . Lumbosacral disc disease    Chronic pain; History of osteomyelitis 2010 following ESI complication  . Obesity   . Osteomyelitis ([place])   . Pelvic fracture ([place])   . Pneumonia   . Recurrent UTI 08/23/2015  . URINARY URGENCY   . Ventral hernia without obstruction or gangrene 05/22/2018  . VITAMIN B12 DEFICIENCY     Patient Active Problem List   Diagnosis Date Noted  . S/P right TKA 08/26/2019  . Ventral hernia without obstruction or gangrene 05/22/2018  . Greenfield filter in place 03/08/2017  . Pain of right lower extremity 03/08/2017  . Preventative health care 02/13/2017  . Hyponatremia 05/02/2016  . GERD (gastroesophageal reflux disease) 08/23/2015  . HSV (herpes simplex virus) infection 08/16/2015  . Hyperlipidemia, mild 08/16/2015  . Osteoporosis   . Chronic diarrhea   . Anemia   . OSTEOMYELITIS, VERTEBRA 05/05/2009  . [place] DISEASE, LUMBOSACRAL SPINE 04/22/2009  . Vitamin B 12 deficiency 07/09/2008  . DIVERTICULOSIS,  COLON 07/09/2008  . COLONIC POLYPS, ADENOMATOUS, HX OF 07/09/2008  . Anxiety state 08/08/2007  . INSOMNIA 08/08/2007  . Bariatric surgery status 08/08/2007  . Midline back pain 10/30/2006  . ALLERGIC RHINITIS 09/18/2006    Past Surgical History:  Procedure Laterality Date  . [place]   for ruptured disc  . CHOLECYSTECTOMY    . COLONOSCOPY    . greenfield filter    . ileojejunal bypass  1976   for obesity  . LAPAROTOMY N/A 09/18/2014   Procedure: EXPLORATORY LAPAROTOMY WITH REPAIR OF CECIAL PERFORATION;  Surgeon: Autumn Messing III, MD;  Location: [facility];  Service: General;  Laterality: N/A;  . TONSILLECTOMY    . TOTAL KNEE ARTHROPLASTY Right 08/26/2019   Procedure: RIGHT TOTAL KNEE ARTHROPLASTY, LEFT KNEE CORTISONE INJECTION;  Surgeon: Paralee Cancel, MD;  Location: [facility];   Service: Orthopedics;  Laterality: Right;  70 MINS  . UPPER GI ENDOSCOPY    . VASCULAR SURGERY Right    revascularization right lower extremity     OB History   No obstetric history on file.     Family History  Problem Relation Age of Onset  . Cervical cancer Mother   . Stroke Mother   . Liver disease Sister   . Kidney disease Sister   . Cirrhosis Sister   . Colon cancer Neg Hx   . Esophageal cancer Neg Hx   . Rectal cancer Neg Hx   . Stomach cancer Neg Hx     Social History   Tobacco Use  . Smoking status: Never Smoker  . Smokeless tobacco: Never Used  Substance Use Topics  . Alcohol use: No    Alcohol/week: 0.0 standard drinks  . Drug use: No    Home Medications Prior to Admission medications   Medication Sig Start Date End Date Taking? Authorizing Provider  acetaminophen (TYLENOL) 500 MG tablet Take 2 tablets (1,000 mg total) by mouth every 8 (eight) hours. 08/27/19   Maurice March, PA-C  albuterol (PROVENTIL HFA;VENTOLIN HFA) 108 (90 BASE) MCG/ACT inhaler Inhale 1-2 puffs into the lungs every 6 (six) hours as needed for wheezing or shortness of breath.    [provider]  ALPRAZolam (XANAX) 1 MG tablet TAKE 1/2 TO 1 TABLET BY MOUTH AT BEDTIME AS NEEDED Patient taking differently: Take 0.5 mg by mouth at bedtime as needed for anxiety or sleep.  03/10/19   Mosie Lukes, MD  amitriptyline (ELAVIL) 100 MG tablet Take 1 tablet (100 mg total) by mouth daily. 09/17/19   Mosie Lukes, MD  azelastine (OPTIVAR) 0.05 % ophthalmic solution Place 1 drop into both eyes daily as needed (for dry eyes).     [provider]  benzonatate (TESSALON) 100 MG capsule Take 1 capsule (100 mg total) by mouth 3 (three) times daily as needed for cough. 04/01/18   Saguier, Percell Miller, PA-C  budesonide-formoterol (SYMBICORT) 160-4.5 MCG/ACT inhaler Inhale 2 puffs into the lungs See admin instructions. Inhale 2 puffs into the lungs one to two times a day Patient taking  differently: Inhale 2 puffs into the lungs in the morning and at bedtime. Inhale 2 puffs into the lungs one to two times a day 03/04/18   Saguier, Percell Miller, PA-C  cetirizine (ZYRTEC) 10 MG tablet Take 10 mg by mouth daily.  02/05/14   [provider]  cyanocobalamin (,VITAMIN B-12,) 1000 MCG/ML injection Inject 1cc into the muscle every month 10/08/19   Gatha Mayer, MD  dicyclomine (BENTYL) 10 MG  capsule Take 1 capsule (10 mg total) by mouth 3 (three) times daily as needed (for diarrhea & symptoms). 12/05/18   Gatha Mayer, MD  diphenoxylate-atropine (LOMOTIL) 2.5-0.025 MG tablet Take 2 tablets 3 times a day as needed for diarrhea 09/17/19   Gatha Mayer, MD  docusate sodium (COLACE) 100 MG capsule Take 1 capsule (100 mg total) by mouth 2 (two) times daily. 08/27/19   Griffith Citron R, PA-C  Fe Fum-FA-B Cmp-C-Zn-Mg-Mn-Cu (HEMOCYTE PLUS) 106-1 MG CAPS TAKE 1 CAPSULE BY MOUTH 2 TIMES DAILY. 09/02/19   Gatha Mayer, MD  ferrous sulfate (FERROUSUL) 325 (65 FE) MG tablet Take 1 tablet (325 mg total) by mouth 3 (three) times daily with meals for 14 days. 08/27/19 09/10/19  Maurice March, PA-C  fluticasone (FLONASE) 50 MCG/ACT nasal spray 2 sprays by Nasal route daily.      [provider]  gabapentin (NEURONTIN) 300 MG capsule TAKE 1 CAPSULE BY MOUTH AT BEDTIME Patient taking differently: Take 300 mg by mouth at bedtime.  08/04/19   Mosie Lukes, MD  hydrOXYzine (ATARAX) 25 MG tablet Take 25 mg by mouth 3 (three) times daily as needed for itching.     [provider]  levocetirizine (XYZAL) 5 MG tablet Take 1 tablet (5 mg total) by mouth every evening. 08/04/14   Rowe Clack, MD  lubiprostone (AMITIZA) 24 MCG capsule Take 24 mcg by mouth daily as needed for constipation.    [provider]  magnesium oxide (MAG-OX 400) 400 MG tablet Take 400 mg by mouth 2 (two) times daily.     [provider]  methocarbamol (ROBAXIN) 500 MG tablet Take 1 tablet  (500 mg total) by mouth every 6 (six) hours as needed for muscle spasms. 08/27/19   Maurice March, PA-C  montelukast (SINGULAIR) 10 MG tablet Take 10 mg by mouth at bedtime.  02/11/14   [provider]  omeprazole (PRILOSEC) 20 MG capsule TAKE 1 CAPSULE BY MOUTH DAILY. Patient taking differently: Take 20 mg by mouth daily.  06/16/19   Gatha Mayer, MD  ondansetron (ZOFRAN-ODT) 4 MG disintegrating tablet Take 1 tablet (4 mg total) by mouth 3 (three) times daily as needed for nausea or vomiting. 08/27/19   Maurice March, PA-C  oxyCODONE (OXY IR/ROXICODONE) 5 MG immediate release tablet Take 1-2 tablets (5-10 mg total) by mouth every 4 (four) hours as needed for moderate pain or severe pain. 08/27/19   Maurice March, PA-C  phenazopyridine (PYRIDIUM) 200 MG tablet Take 200 mg by mouth 3 (three) times daily as needed for pain.     [provider]  polyethylene glycol (MIRALAX / GLYCOLAX) 17 g packet Take 17 g by mouth 2 (two) times daily. 08/27/19   Maurice March, PA-C  potassium chloride SA (KLOR-CON) 20 MEQ tablet TAKE 1 TABLET BY MOUTH ONCE DAILY 09/12/19   Mosie Lukes, MD  Probiotic Product (PROBIOTIC DAILY PO) Take 1 capsule by mouth daily.    [provider]  rivaroxaban (XARELTO) 10 MG TABS tablet Take 1 tablet (10 mg total) by mouth daily for 14 days. 08/28/19 09/11/19  Maurice March, PA-C  valACYclovir (VALTREX) 500 MG tablet TAKE 1 TABLET (500 MG TOTAL) BY MOUTH AS NEEDED. 09/12/18   Mosie Lukes, MD  Vitamin D, Ergocalciferol, (DRISDOL) 1.25 MG (50000 UNIT) CAPS capsule TAKE 1 CAPSULE (50,000 UNITS TOTAL) BY MOUTH EVERY 7 (SEVEN) DAYS. 09/25/19   Mosie Lukes, MD  vitamin  E 400 UNIT capsule Take 400 Units by mouth daily.      [provider]  zolpidem (AMBIEN) 5 MG tablet TAKE 1 TABLET BY MOUTH AT BEDTIME 10/08/19   Mosie Lukes, MD    Allergies    Ativan [lorazepam], Other, Azithromycin, Benadryl [diphenhydramine hcl], Ciprofloxacin hcl,  Codeine phosphate, Hydrocodone, Hydrocodone-homatropine, Levaquin [levofloxacin in d5w], Levofloxacin, Meperidine hcl, Molds & smuts, Sulfa antibiotics, Tape, and Ultram [tramadol hcl]  Review of Systems   Review of Systems  Musculoskeletal: Positive for neck pain.       Left ankle pain Right arm pain  Neurological: Positive for weakness.  All other systems reviewed and are negative.   Physical Exam Updated Vital Signs BP 133/64 (BP Location: Right Arm)   Pulse (!) 118   Temp (!) 100.8 F (38.2 C) (Rectal)   Resp 18   Ht 5\' 5"  (1.651 m)   Wt 79.8 kg   SpO2 94%   BMI 29.29 kg/m   Physical Exam Vitals and nursing note reviewed.  Constitutional:      Appearance: Normal appearance.  HENT:     Head: Normocephalic and atraumatic.     Right Ear: External ear normal.     Left Ear: External ear normal.     Nose: Nose normal.     Mouth/Throat:     Mouth: Mucous membranes are moist.     Pharynx: Oropharynx is clear.  Eyes:     Extraocular Movements: Extraocular movements intact.     Conjunctiva/sclera: Conjunctivae normal.     Pupils: Pupils are equal, round, and reactive to light.  Cardiovascular:     Rate and Rhythm: Regular rhythm. Tachycardia present.     Pulses: Normal pulses.     Heart sounds: Normal heart sounds.  Pulmonary:     Effort: Pulmonary effort is normal.     Breath sounds: Normal breath sounds.  Abdominal:     General: Abdomen is flat. Bowel sounds are normal.     Palpations: Abdomen is soft.  Musculoskeletal:     Cervical back: Normal range of motion and neck supple.       Legs:  Skin:    General: Skin is warm.     Capillary Refill: Capillary refill takes less than 2 seconds.  Neurological:     General: No focal deficit present.     Mental Status: She is alert and oriented to person, place, and time.  Psychiatric:        Mood and Affect: Mood normal.        Behavior: Behavior normal.     ED Results / Procedures / Treatments   Labs (all labs  ordered are listed, but only abnormal results are displayed) Labs Reviewed  CBC WITH DIFFERENTIAL/PLATELET - Abnormal; Notable for the following components:      Result Value   WBC 11.5 (*)    RBC 3.41 (*)    Hemoglobin 10.2 (*)    HCT 31.7 (*)    Neutro Abs 9.2 (*)    Monocytes Absolute 1.3 (*)    All other components within normal limits  COMPREHENSIVE METABOLIC PANEL - Abnormal; Notable for the following components:   Sodium 132 (*)    Potassium 3.0 (*)    Chloride 96 (*)    Glucose, Bld 190 (*)    Calcium 8.4 (*)    Total Protein 6.1 (*)    Albumin 2.5 (*)    All other components within normal limits  CBG MONITORING, ED -  Abnormal; Notable for the following components:   Glucose-Capillary 180 (*)    All other components within normal limits  SARS CORONAVIRUS 2 BY RT PCR (HOSPITAL ORDER, [place] [facility])  CULTURE, BLOOD (ROUTINE X 2)  CULTURE, BLOOD (ROUTINE X 2)  URINE CULTURE  LACTIC ACID, PLASMA  URINALYSIS, ROUTINE W REFLEX MICROSCOPIC  LACTIC ACID, PLASMA  TROPONIN I (HIGH SENSITIVITY)  TROPONIN I (HIGH SENSITIVITY)    EKG EKG Interpretation  Date/Time:  Thursday November 06 2019 12:06:16 EDT Ventricular Rate:  119 PR Interval:    QRS Duration: 119 QT Interval:  299 QTC Calculation: 421 R Axis:   53 Text Interpretation: Sinus tachycardia LVH with secondary repolarization abnormality Anteroseptal infarct, possibly acute Baseline wander in lead(s) V4 No significant change since last tracing Confirmed by Isla Pence [id number]) on 11/06/2019 12:59:15 PM   Radiology DG Chest 2 View  Result Date: 11/06/2019 CLINICAL DATA:  Altered mental status.  Recent fall. EXAM: CHEST - 2 VIEW COMPARISON:  Chest x-ray dated November 24, 2014. FINDINGS: The heart size and mediastinal contours are within normal limits. Both lungs are clear. The visualized skeletal structures are unremarkable. IMPRESSION: No active cardiopulmonary disease. Electronically Signed    By: Titus Dubin M.D.   On: 11/06/2019 13:16   DG Shoulder Right  Result Date: 11/06/2019 CLINICAL DATA:  Right shoulder pain.  Recent fall. EXAM: RIGHT SHOULDER - 2+ VIEW COMPARISON:  None. FINDINGS: No acute fracture or dislocation. Moderate acromioclavicular and early glenohumeral osteoarthritis. Soft tissues are unremarkable. IMPRESSION: 1. No acute osseous abnormality.  Degenerative changes. Electronically Signed   By: Titus Dubin M.D.   On: 11/06/2019 13:21   DG Ankle Complete Left  Result Date: 11/06/2019 CLINICAL DATA:  Left ankle pain.  Recent fall. EXAM: LEFT ANKLE COMPLETE - 3+ VIEW COMPARISON:  None. FINDINGS: No acute fracture or dislocation. The ankle mortise is symmetric. The talar dome is intact. No tibiotalar joint effusion. Osteopenia. Diffuse soft tissue swelling. IMPRESSION: 1. Diffuse soft tissue swelling. No acute osseous abnormality. Electronically Signed   By: Titus Dubin M.D.   On: 11/06/2019 13:19   CT Head Wo Contrast  Result Date: 11/06/2019 CLINICAL DATA:  Head trauma, moderate/severe. Neck trauma. Generalized weakness. Fall on Saturday. EXAM: CT HEAD WITHOUT CONTRAST CT CERVICAL SPINE WITHOUT CONTRAST TECHNIQUE: Multidetector CT imaging of the head and cervical spine was performed following the standard protocol without intravenous contrast. Multiplanar CT image reconstructions of the cervical spine were also generated. COMPARISON:  No pertinent prior exams are available for comparison. FINDINGS: CT HEAD FINDINGS Brain: Mild generalized parenchymal atrophy. There is no acute intracranial hemorrhage. No demarcated cortical infarct. No extra-axial fluid collection. No evidence of intracranial mass. No midline shift. Vascular: No hyperdense vessel. Atherosclerotic calcifications. Skull: Normal. Negative for fracture or focal lesion. Sinuses/Orbits: Visualized orbits show no acute finding. Mild ethmoid and right max sinus mucosal thickening. Partially imaged left  maxillary sinus mucous retention cyst. No significant mastoid effusion CT CERVICAL SPINE FINDINGS Alignment: Straightening of the expected cervical lordosis. Trace C3-C4 and C4-C5 grade 1 anterolisthesis. Rightward rotation of C1 upon C2 may be related to patient head positioning at the time of examination. Skull base and vertebrae: The basion-dental and atlanto-dental intervals are maintained.No evidence of acute fracture to the cervical spine. The bones are diffusely demineralized. Soft tissues and spinal canal: No prevertebral fluid or swelling. No visible canal hematoma. Disc levels: Cervical spondylosis with multilevel disc space narrowing, posterior disc osteophytes, uncovertebral and facet hypertrophy.  Disc space narrowing is advanced throughout the cervical spine. No high-grade bony spinal canal stenosis. Multilevel neural foraminal narrowing. Also of note, there is ligamentous hypertrophy/pannus formation posterior to the dens. Upper chest: No consolidation within the imaged lung apices. No visible pneumothorax. IMPRESSION: CT head: 1. No evidence of acute intracranial abnormality. 2. Mild generalized parenchymal atrophy. 3. Mild ethmoid and right maxillary sinus mucosal thickening at the imaged levels. Partially imaged left maxillary sinus mucous retention cyst. CT cervical spine: 1. No evidence of acute fracture to the cervical spine. 2. Rightward rotation of C1 upon C2 may be related to patient head positioning at the time of examination. However, clinical correlation is recommended. 3. Mild C3-C4 and C4-C5 grade 1 anterolisthesis. 4. Cervical spondylosis as described. 5. Diffuse osseous demineralization. Electronically Signed   By: Kellie Simmering DO   On: 11/06/2019 14:51   CT Angio Chest PE W and/or Wo Contrast  Result Date: 11/06/2019 CLINICAL DATA:  Generalized weakness EXAM: CT ANGIOGRAPHY CHEST WITH CONTRAST TECHNIQUE: Multidetector CT imaging of the chest was performed using the standard  protocol during bolus administration of intravenous contrast. Multiplanar CT image reconstructions and MIPs were obtained to evaluate the vascular anatomy. CONTRAST:  55mL OMNIPAQUE IOHEXOL 350 MG/ML SOLN COMPARISON:  2016 FINDINGS: Cardiovascular: Satisfactory opacification of the pulmonary arteries to the proximal segmental level. No evidence of acute pulmonary embolism. Normal heart size. No pericardial effusion. Coronary artery and thoracic aortic calcification. Mediastinum/Nodes: No enlarged lymph nodes identified. The thyroid is unremarkable Lungs/Pleura: Bilateral dependent atelectatic changes. No pleural effusion or pneumothorax. Upper Abdomen: No acute abnormality. Musculoskeletal: No acute osseous abnormality. Chronic loss of height at the inferior endplate of T7. Review of the MIP images confirms the above findings. IMPRESSION: No evidence of acute pulmonary embolism or other acute abnormality. Electronically Signed   By: Macy Mis M.D.   On: 11/06/2019 15:03   CT Cervical Spine Wo Contrast  Result Date: 11/06/2019 CLINICAL DATA:  Head trauma, moderate/severe. Neck trauma. Generalized weakness. Fall on Saturday. EXAM: CT HEAD WITHOUT CONTRAST CT CERVICAL SPINE WITHOUT CONTRAST TECHNIQUE: Multidetector CT imaging of the head and cervical spine was performed following the standard protocol without intravenous contrast. Multiplanar CT image reconstructions of the cervical spine were also generated. COMPARISON:  No pertinent prior exams are available for comparison. FINDINGS: CT HEAD FINDINGS Brain: Mild generalized parenchymal atrophy. There is no acute intracranial hemorrhage. No demarcated cortical infarct. No extra-axial fluid collection. No evidence of intracranial mass. No midline shift. Vascular: No hyperdense vessel. Atherosclerotic calcifications. Skull: Normal. Negative for fracture or focal lesion. Sinuses/Orbits: Visualized orbits show no acute finding. Mild ethmoid and right max sinus  mucosal thickening. Partially imaged left maxillary sinus mucous retention cyst. No significant mastoid effusion CT CERVICAL SPINE FINDINGS Alignment: Straightening of the expected cervical lordosis. Trace C3-C4 and C4-C5 grade 1 anterolisthesis. Rightward rotation of C1 upon C2 may be related to patient head positioning at the time of examination. Skull base and vertebrae: The basion-dental and atlanto-dental intervals are maintained.No evidence of acute fracture to the cervical spine. The bones are diffusely demineralized. Soft tissues and spinal canal: No prevertebral fluid or swelling. No visible canal hematoma. Disc levels: Cervical spondylosis with multilevel disc space narrowing, posterior disc osteophytes, uncovertebral and facet hypertrophy. Disc space narrowing is advanced throughout the cervical spine. No high-grade bony spinal canal stenosis. Multilevel neural foraminal narrowing. Also of note, there is ligamentous hypertrophy/pannus formation posterior to the dens. Upper chest: No consolidation within the imaged lung apices. No visible  pneumothorax. IMPRESSION: CT head: 1. No evidence of acute intracranial abnormality. 2. Mild generalized parenchymal atrophy. 3. Mild ethmoid and right maxillary sinus mucosal thickening at the imaged levels. Partially imaged left maxillary sinus mucous retention cyst. CT cervical spine: 1. No evidence of acute fracture to the cervical spine. 2. Rightward rotation of C1 upon C2 may be related to patient head positioning at the time of examination. However, clinical correlation is recommended. 3. Mild C3-C4 and C4-C5 grade 1 anterolisthesis. 4. Cervical spondylosis as described. 5. Diffuse osseous demineralization. Electronically Signed   By: Kellie Simmering DO   On: 11/06/2019 14:51    Procedures Procedures (including critical care time)  Medications Ordered in ED Medications  cefTRIAXone (ROCEPHIN) 1 g in sodium chloride 0.9 % 100 mL IVPB (has no administration in  time range)  acetaminophen (TYLENOL) tablet 1,000 mg (1,000 mg Oral Given 11/06/19 1513)  iohexol (OMNIPAQUE) 350 MG/ML injection 75 mL (75 mLs Intravenous Contrast Given 11/06/19 1416)  sodium chloride 0.9 % bolus 1,000 mL (1,000 mLs Intravenous New Bag/Given 11/06/19 1511)    ED Course  I have reviewed the triage vital signs and the nursing notes.  Pertinent labs & imaging results that were available during my care of the patient were reviewed by me and considered in my medical decision making (see chart for details).    MDM Rules/Calculators/A&P                          Pt does have a DVT.  I asked pharmacy to consult for DVT.  I suspect pt has a uti.  She does not meet sepsis criteria.  Pt given rocephin.  Pt signed out to Dr. Langston Masker at shift change.  Anticipate admission.   Final Clinical Impression(s) / ED Diagnoses Final diagnoses:  Sprain of left ankle, unspecified ligament, initial encounter  Acute deep vein thrombosis (DVT) of proximal vein of left lower extremity [facility])    Rx / DC Orders ED Discharge Orders    None       Isla Pence, MD 11/06/19 1558

## 2019-11-06 NOTE — Progress Notes (Addendum)
[place] for Heparin Indication: DVT  Allergies  Allergen Reactions  . Ativan [Lorazepam] Anaphylaxis and Other (See Comments)    Deathly allergic; "resp rate dropped dropped to 6"  . Other Anaphylaxis    Red peppers--choking, also  . Azithromycin Diarrhea  . Benadryl [Diphenhydramine Hcl] Other (See Comments)    Paradoxical response  . Ciprofloxacin Hcl Other (See Comments)    Causes yeast infection and refuses to take  . Codeine Phosphate Nausea Only    Can tolerate in limited amounts  . Hydrocodone Itching and Nausea And Vomiting  . Hydrocodone-Homatropine Itching and Nausea And Vomiting  . Levaquin [Levofloxacin In D5w] Other (See Comments)    Muscle soreness  . Levofloxacin   . Meperidine Hcl Other (See Comments)    Reaction ??  . Molds & Smuts Other (See Comments)    unsure  . Sulfa Antibiotics Other (See Comments)    Joint pain  . Tape Other (See Comments)    No Coban wrap (per the patient)  . Ultram [Tramadol Hcl] Other (See Comments)    "Can't move joints"    Patient Measurements: Height: 5\' 5"  (165.1 cm) Weight: 79.8 kg (176 lb) IBW/kg (Calculated) : 57 Heparin Dosing Weight: 73.8 kg  Vital Signs: Temp: 97.8 F (36.6 C) (08/12 1828) Temp Source: Oral (08/12 1828) BP: 122/66 (08/12 1829) Pulse Rate: 97 (08/12 1829)  Labs: Recent Labs    11/06/19 1217  HGB 10.2*  HCT 31.7*  PLT 263  CREATININE 0.52  TROPONINIHS 16    Estimated Creatinine Clearance: 54.6 mL/min (by C-G formula based on SCr of 0.52 mg/dL).   Medical History: Past Medical History:  Diagnosis Date  . ALLERGIC RHINITIS   . ANEMIA, CHRONIC    Malabsorption related to bypass hx, B12 and iron deficiency  . ANXIETY   . Arthritis   . ASTHMA   . Bariatric surgery status   . Bowel perforation ([place]) 2016  . C. difficile diarrhea   . Colon perforation ([place]) 08/2014   following polypetomy during colo, surgical repair  . COLONIC POLYPS,  ADENOMATOUS, HX OF 2010  . DIVERTICULOSIS, COLON   . DVT (deep venous thrombosis) ([place]) 2018  . DVT (deep venous thrombosis) ([place])   . GERD (gastroesophageal reflux disease) 08/23/2015  . Graded compression stocking in place   . History of blood transfusion   . History of migraine   . History of rheumatic fever   . HSV (herpes simplex virus) infection 08/16/2015  . Hyperlipidemia   . Hypertension    Denies  . Hyponatremia 05/02/2016  . Incomplete left bundle branch block (LBBB) 2016   Noted on EKG  . INSOMNIA   . Lumbosacral disc disease    Chronic pain; History of osteomyelitis 2010 following ESI complication  . Obesity   . Osteomyelitis ([place])   . Pelvic fracture ([place])   . Pneumonia   . Recurrent UTI 08/23/2015  . URINARY URGENCY   . Ventral hernia without obstruction or gangrene 05/22/2018  . VITAMIN B12 DEFICIENCY     Medications:  Scheduled:    Assessment: Patient is a 67 yof that presents to the ED with c/o left foot and ankle pain. The patient was found to have a DVT after her [place]. Pharmacy has been asked to dose heparin at this time. The Patient was previously on xarelto ~ 1 month ago post ortho procedure however this was only for 14 days.   Goal of Therapy:  Heparin level 0.3-0.7  units/ml Monitor platelets by anticoagulation protocol: Yes   Plan:  - Heparin bolus 5000 units IV x 1 dose - Heparin drip @ 1250 units/hr - Heparin level in ~ 6 hours  - Monitor patient for s/s of bleeding and CBC while on heparin   Duanne Limerick PharmD. BCPS  11/06/2019,6:52 PM

## 2019-11-06 NOTE — Progress Notes (Signed)
Pt arrived to rm 22 from ED. Initiated tele. CHG bath performed. Vss. Call bell within reach.   Lavenia Atlas, RN

## 2019-11-07 ENCOUNTER — Inpatient Hospital Stay (HOSPITAL_COMMUNITY): Payer: PPO

## 2019-11-07 DIAGNOSIS — M7989 Other specified soft tissue disorders: Secondary | ICD-10-CM

## 2019-11-07 DIAGNOSIS — I350 Nonrheumatic aortic (valve) stenosis: Secondary | ICD-10-CM

## 2019-11-07 DIAGNOSIS — I361 Nonrheumatic tricuspid (valve) insufficiency: Secondary | ICD-10-CM | POA: Diagnosis not present

## 2019-11-07 LAB — CBC
HCT: 30.4 % — ABNORMAL LOW (ref 36.0–46.0)
Hemoglobin: 9.8 g/dL — ABNORMAL LOW (ref 12.0–15.0)
MCH: 29.6 pg (ref 26.0–34.0)
MCHC: 32.2 g/dL (ref 30.0–36.0)
MCV: 91.8 fL (ref 80.0–100.0)
Platelets: 260 10*3/uL (ref 150–400)
RBC: 3.31 MIL/uL — ABNORMAL LOW (ref 3.87–5.11)
RDW: 14.6 % (ref 11.5–15.5)
WBC: 10.8 10*3/uL — ABNORMAL HIGH (ref 4.0–10.5)
nRBC: 0 % (ref 0.0–0.2)

## 2019-11-07 LAB — VITAMIN B12: Vitamin B-12: 221 pg/mL (ref 180–914)

## 2019-11-07 LAB — ECHOCARDIOGRAM COMPLETE
AR max vel: 1.85 cm2
AV Area VTI: 1.83 cm2
AV Area mean vel: 1.74 cm2
AV Mean grad: 12.5 mmHg
AV Peak grad: 19.9 mmHg
Ao pk vel: 2.23 m/s
Area-P 1/2: 5.38 cm2
Height: 65 in
S' Lateral: 2.7 cm
Weight: 2800 oz

## 2019-11-07 LAB — IRON AND TIBC
Iron: 12 ug/dL — ABNORMAL LOW (ref 28–170)
Saturation Ratios: 8 % — ABNORMAL LOW (ref 10.4–31.8)
TIBC: 143 ug/dL — ABNORMAL LOW (ref 250–450)
UIBC: 131 ug/dL

## 2019-11-07 LAB — COMPREHENSIVE METABOLIC PANEL
ALT: 28 U/L (ref 0–44)
AST: 27 U/L (ref 15–41)
Albumin: 2.3 g/dL — ABNORMAL LOW (ref 3.5–5.0)
Alkaline Phosphatase: 47 U/L (ref 38–126)
Anion gap: 10 (ref 5–15)
BUN: 8 mg/dL (ref 8–23)
CO2: 26 mmol/L (ref 22–32)
Calcium: 8.2 mg/dL — ABNORMAL LOW (ref 8.9–10.3)
Chloride: 99 mmol/L (ref 98–111)
Creatinine, Ser: 0.52 mg/dL (ref 0.44–1.00)
GFR calc Af Amer: >60 mL/min (ref >60)
GFR calc non Af Amer: >60 mL/min (ref >60)
Glucose, Bld: 132 mg/dL — ABNORMAL HIGH (ref 70–99)
Potassium: 3.3 mmol/L — ABNORMAL LOW (ref 3.5–5.1)
Sodium: 135 mmol/L (ref 135–145)
Total Bilirubin: 0.5 mg/dL (ref 0.3–1.2)
Total Protein: 5.6 g/dL — ABNORMAL LOW (ref 6.5–8.1)

## 2019-11-07 LAB — HEPARIN LEVEL (UNFRACTIONATED)
Heparin Unfractionated: <0.1 IU/mL — ABNORMAL LOW (ref 0.30–0.70)
Heparin Unfractionated: 0.18 IU/mL — ABNORMAL LOW (ref 0.30–0.70)
Heparin Unfractionated: 0.26 IU/mL — ABNORMAL LOW (ref 0.30–0.70)

## 2019-11-07 LAB — URINE CULTURE: Culture: NO GROWTH

## 2019-11-07 LAB — RETICULOCYTES
Immature Retic Fract: 21.6 % — ABNORMAL HIGH (ref 2.3–15.9)
RBC.: 3.26 MIL/uL — ABNORMAL LOW (ref 3.87–5.11)
Retic Count, Absolute: 45.7 10*3/uL (ref 19.0–186.0)
Retic Ct Pct: 1.4 % (ref 0.4–3.1)

## 2019-11-07 LAB — HEMOGLOBIN A1C
Hgb A1c MFr Bld: 5.1 % (ref 4.8–5.6)
Mean Plasma Glucose: 99.67 mg/dL

## 2019-11-07 LAB — FOLATE: Folate: 26.3 ng/mL (ref >5.9)

## 2019-11-07 LAB — GLUCOSE, CAPILLARY: Glucose-Capillary: 109 mg/dL — ABNORMAL HIGH (ref 70–99)

## 2019-11-07 LAB — TSH: TSH: 1.572 u[IU]/mL (ref 0.350–4.500)

## 2019-11-07 LAB — CK: Total CK: 28 U/L — ABNORMAL LOW (ref 38–234)

## 2019-11-07 LAB — AMMONIA: Ammonia: 45 umol/L — ABNORMAL HIGH (ref 9–35)

## 2019-11-07 LAB — URIC ACID: Uric Acid, Serum: 2.2 mg/dL — ABNORMAL LOW (ref 2.5–7.1)

## 2019-11-07 LAB — RPR: RPR Ser Ql: NONREACTIVE

## 2019-11-07 LAB — FERRITIN: Ferritin: 743 ng/mL — ABNORMAL HIGH (ref 11–307)

## 2019-11-07 MED ORDER — MAGNESIUM SULFATE 2 GM/50ML IV SOLN
2.0000 g | Freq: Once | INTRAVENOUS | Status: AC
  Administered 2019-11-07: 2 g via INTRAVENOUS
  Filled 2019-11-07: qty 50

## 2019-11-07 MED ORDER — OXYCODONE HCL 5 MG PO TABS
5.0000 mg | ORAL_TABLET | Freq: Four times a day (QID) | ORAL | Status: AC | PRN
  Administered 2019-11-07 – 2019-11-09 (×5): 5 mg via ORAL
  Filled 2019-11-07 (×5): qty 1

## 2019-11-07 MED ORDER — CEFAZOLIN SODIUM-DEXTROSE 1-4 GM/50ML-% IV SOLN
1.0000 g | Freq: Three times a day (TID) | INTRAVENOUS | Status: DC
  Administered 2019-11-07 – 2019-11-10 (×6): 1 g via INTRAVENOUS
  Filled 2019-11-07 (×10): qty 50

## 2019-11-07 MED ORDER — POTASSIUM CHLORIDE CRYS ER 20 MEQ PO TBCR
40.0000 meq | EXTENDED_RELEASE_TABLET | Freq: Once | ORAL | Status: AC
  Administered 2019-11-07: 40 meq via ORAL
  Filled 2019-11-07: qty 2

## 2019-11-07 MED ORDER — CHLORHEXIDINE GLUCONATE CLOTH 2 % EX PADS
6.0000 | MEDICATED_PAD | Freq: Every day | CUTANEOUS | Status: DC
  Administered 2019-11-07 – 2019-11-14 (×7): 6 via TOPICAL

## 2019-11-07 MED ORDER — HEPARIN BOLUS VIA INFUSION
3000.0000 [IU] | Freq: Once | INTRAVENOUS | Status: AC
  Administered 2019-11-07: 3000 [IU] via INTRAVENOUS
  Filled 2019-11-07: qty 3000

## 2019-11-07 MED ORDER — COLCHICINE 0.6 MG PO TABS
0.6000 mg | ORAL_TABLET | Freq: Two times a day (BID) | ORAL | Status: DC
  Administered 2019-11-07 – 2019-11-09 (×5): 0.6 mg via ORAL
  Filled 2019-11-07 (×5): qty 1

## 2019-11-07 NOTE — Progress Notes (Signed)
Orthopedic Tech Progress Note Patient Details:  Alexis Olson [facility] 11/02/1933 [id number]  Ortho Devices Type of Ortho Device: Post (short leg) splint Ortho Device/Splint Location: LLE Ortho Device/Splint Interventions: Application   Post Interventions Patient Tolerated: Well Instructions Provided: Care of device   Alexis Olson Alexis Olson 11/07/2019, 8:49 PM

## 2019-11-07 NOTE — Progress Notes (Signed)
[place] for Heparin Indication: DVT  Allergies  Allergen Reactions  . Ativan [Lorazepam] Anaphylaxis and Other (See Comments)    Deathly allergic; "resp rate dropped dropped to 6"  . Other Anaphylaxis    Red peppers--choking, also  . Azithromycin Diarrhea  . Benadryl [Diphenhydramine Hcl] Other (See Comments)    Paradoxical response  . Ciprofloxacin Hcl Other (See Comments)    Causes yeast infection and refuses to take  . Codeine Phosphate Nausea Only    Can tolerate in limited amounts  . Hydrocodone Itching and Nausea And Vomiting  . Hydrocodone-Homatropine Itching and Nausea And Vomiting  . Levaquin [Levofloxacin In D5w] Other (See Comments)    Muscle soreness  . Levofloxacin   . Meperidine Hcl Other (See Comments)    Reaction ??  . Molds & Smuts Other (See Comments)    unsure  . Sulfa Antibiotics Other (See Comments)    Joint pain  . Tape Other (See Comments)    No Coban wrap (per the patient)  . Tramadol   . Ultram [Tramadol Hcl] Other (See Comments)    "Can't move joints"    Patient Measurements: Height: 5\' 5"  (165.1 cm) Weight: 79.4 kg (175 lb) (stated by pt) IBW/kg (Calculated) : 57 Heparin Dosing Weight: 73.7 kg  Vital Signs: Temp: 100.9 F (38.3 C) (08/13 1650) Temp Source: Oral (08/13 1650) BP: 140/75 (08/13 1650) Pulse Rate: 108 (08/13 2140)  Labs: Recent Labs    11/06/19 1217 11/06/19 1836 11/07/19 0222 11/07/19 1133 11/07/19 2246  HGB 10.2*  --  9.8*  --   --   HCT 31.7*  --  30.4*  --   --   PLT 263  --  260  --   --   HEPARINUNFRC  --   --  <0.10* 0.18* 0.26*  CREATININE 0.52  --  0.52  --   --   CKTOTAL  --   --  28*  --   --   TROPONINIHS 16 34*  --   --   --     Estimated Creatinine Clearance: 54.5 mL/min (by C-G formula based on SCr of 0.52 mg/dL).   Medical History: Past Medical History:  Diagnosis Date  . ALLERGIC RHINITIS   . ANEMIA, CHRONIC    Malabsorption related to bypass hx, B12  and iron deficiency  . ANXIETY   . Arthritis   . ASTHMA   . Bariatric surgery status   . Bowel perforation ([place]) 2016  . C. difficile diarrhea   . Colon perforation ([place]) 08/2014   following polypetomy during colo, surgical repair  . COLONIC POLYPS, ADENOMATOUS, HX OF 2010  . DIVERTICULOSIS, COLON   . DVT (deep venous thrombosis) ([place]) 2018  . DVT (deep venous thrombosis) ([place])   . GERD (gastroesophageal reflux disease) 08/23/2015  . Graded compression stocking in place   . History of blood transfusion   . History of migraine   . History of rheumatic fever   . HSV (herpes simplex virus) infection 08/16/2015  . Hyperlipidemia   . Hypertension    Denies  . Hyponatremia 05/02/2016  . Incomplete left bundle branch block (LBBB) 2016   Noted on EKG  . INSOMNIA   . Lumbosacral disc disease    Chronic pain; History of osteomyelitis 2010 following ESI complication  . Obesity   . Osteomyelitis ([place])   . Pelvic fracture ([place])   . Pneumonia   . Recurrent UTI 08/23/2015  . URINARY URGENCY   .  Ventral hernia without obstruction or gangrene 05/22/2018  . VITAMIN B12 DEFICIENCY     Medications:  Scheduled:  . Chlorhexidine Gluconate Cloth  6 each Topical Daily  . colchicine  0.6 mg Oral BID  . docusate sodium  100 mg Oral BID  . loratadine  10 mg Oral Daily  . mometasone-formoterol  2 puff Inhalation BID  . montelukast  10 mg Oral QHS  . multivitamin with minerals  1 tablet Oral Daily  . polyethylene glycol  17 g Oral BID    Assessment: 66 yof presents with c/o left foot and ankle pain, found to have DVT. Pharmacy has been asked to dose heparin. Patient was previously on Xarelto ~1 month ago post-ortho procedure; however, this was only for 14 days. Currently on no anticoagulation PTA.  8/13 PM update:  Heparin level low but trending up  Goal of Therapy:  Heparin level 0.3-0.7 units/ml Monitor platelets by anticoagulation protocol: Yes   Plan:  Increase heparin IV to 1950  units/hr Check heparin level in 8 hrs Monitor daily heparin level and CBC, s/sx bleeding F/u long-term anticoagulation plan  Narda Bonds, PharmD, BCPS Clinical Pharmacist Phone: [phone]

## 2019-11-07 NOTE — Progress Notes (Signed)
Patient's daughter, Caryl Bis, would like a call from a physician to update on patient's condition. Her best contact number is [phone].

## 2019-11-07 NOTE — Progress Notes (Addendum)
ANTICOAGULATION CONSULT NOTE - Follow Up Consult  Pharmacy Consult for heparin Indication: DVT  Labs: Recent Labs    11/06/19 1217 11/06/19 1836 11/07/19 0222  HGB 10.2*  --  9.8*  HCT 31.7*  --  30.4*  PLT 263  --  260  HEPARINUNFRC  --   --  <0.10*  CREATININE 0.52  --  0.52  CKTOTAL  --   --  28*  TROPONINIHS 16 34*  --     Assessment: 86yo female subtherapeutic on heparin with initial dosing for DVT; no gtt issues or signs of bleeding per RN.  Goal of Therapy:  Heparin level 0.3-0.7 units/ml   Plan:  Will rebolus with heparin 3000 units and increase heparin gtt by 4 units/kg/hr to 1600 units/hr and check level in 8 hours.    Wynona Neat, PharmD, BCPS  11/07/2019,3:34 AM

## 2019-11-07 NOTE — Progress Notes (Signed)
PROGRESS NOTE    Alexis Olson  TFT:[id number] DOB: July 08, 1933 DOA: 11/06/2019 PCP: Mosie Lukes, MD    Chief Complaint  Patient presents with  . Foot Pain  . Weakness    Brief Narrative:   86 year old lady with prior history of hypertension, GERD, diverticulosis, vitamin B12 deficiency, hyperlipidemia, anxiety disorder, recurrent urinary tract infection, resolved chronic low back pain presents to ED with an accidental fall at home several days prior to arrival to ED. Complaints of pain in the left foot and ankle.  On arrival patient was lethargic and confused.  X-rays of the ankle did not reveal any fracture.  Venous duplex of the lower extremity reveal an acute DVT of the left lower extremity. CT angiogram was negative for PE. Assessment & Plan:   Active Problems:   Toxic metabolic encephalopathy   Metabolic encephalopathy and encephalopathy sec to polypharmacy.  Improved.she is alert ,oriented and answering all questions appropriately.  Minimize pain meds.     Accidental fall at home.  CK levels minimally elevated.    GERD  Stable.    DVT  Resume IV heparin, possible transition to oral eliquis in am.    Hypokalemia and hypomagnesemia Replaced.   Right hand swelling Unclear etiology.  Cellulitis vs gouty attack vs ? Septic arthritis Started the patient empirically on IV Ancef, colchicine,  X rays of the hand ordered for further evaluation.  Afebrile today with mild leukocytosis.  Will get Pro calcitonin level, ESr , CRP.   Hyponatremia:  Probably from dehydration.  Resolved with IV fluids.    Anemia of chronic disease:  Anemia panel reviewed.  Iron deficiency,  Supplementation will be added.   Essential hypertension:  Well controlled BP parameters.   DVT prophylaxis: (Heparin) Code Status: full code.  Family Communication: none at bedside.  Disposition:   Status is: Inpatient  Remains inpatient appropriate because:IV treatments appropriate  due to intensity of illness or inability to take PO   Dispo: The patient is from: Home              Anticipated d/c is to: pending.              Anticipated d/c date is: > 3 days              Patient currently is not medically stable to d/c.       Consultants:   None.   Procedures: none.  Antimicrobials: Antibiotics Given (last 72 hours)    Date/Time Action Medication Dose Rate   11/06/19 1713 New Bag/Given   cefTRIAXone (ROCEPHIN) 1 g in sodium chloride 0.9 % 100 mL IVPB 1 g 200 mL/hr       Subjective: Right hand swelling,  Bilateral leg swelling.   Objective: Vitals:   11/06/19 2013 11/06/19 2325 11/07/19 0338 11/07/19 0925  BP: 133/70 (!) 146/77 (!) 141/75 123/62  Pulse: (!) 101 (!) 104 (!) 107 (!) 101  Resp: _0 Temp: 98.4 F (36.9 C) 99.8 F (37.7 C) 100.2 F (37.9 C) 98.5 F (36.9 C)  TempSrc: Oral Oral Oral Oral  SpO2: 92% 92% 93% 98%  Weight:      Height:        Intake/Output Summary (Last 24 hours) at 11/07/2019 1454 Last data filed at 11/07/2019 1419 Gross per 24 hour  Intake 1239.22 ml  Output 1725 ml  Net -485.78 ml   Filed Weights   11/06/19 1206 11/06/19 1909  Weight: 79.8 kg 79.4 kg  Examination:  General exam: Appears to be in mild distress from pain in the legs.  Respiratory system: Clear to auscultation. Respiratory effort normal. Cardiovascular system: S1 & S2 heard, RRR. No JVD,s. No pedal edema. Gastrointestinal system: Abdomen is nondistended, soft and nontender.  Normal bowel sounds heard. Central nervous system: Alert and oriented. No focal neurological deficits. Extremities: bilateral leg edema, right hand swelling started 3 days ago.  Skin: No rashes, lesions or ulcers Psychiatry: Mood & affect appropriate.     Data Reviewed: I have personally reviewed following labs and imaging studies  CBC: Recent Labs  Lab 11/06/19 1217 11/07/19 0222  WBC 11.5* 10.8*  NEUTROABS 9.2*  --   HGB 10.2* 9.8*  HCT 31.7*  30.4*  MCV 93.0 91.8  PLT 263 622    Basic Metabolic Panel: Recent Labs  Lab 11/06/19 1217 11/06/19 1836 11/07/19 0222  NA 132*  --  135  K 3.0*  --  3.3*  CL 96*  --  99  CO2 28  --  26  GLUCOSE 190*  --  132*  BUN 10  --  8  CREATININE 0.52  --  0.52  CALCIUM 8.4*  --  8.2*  MG  --  1.4*  --     GFR: Estimated Creatinine Clearance: 54.5 mL/min (by C-G formula based on SCr of 0.52 mg/dL).  Liver Function Tests: Recent Labs  Lab 11/06/19 1217 11/07/19 0222  AST 21 27  ALT 20 28  ALKPHOS 47 47  BILITOT 0.5 0.5  PROT 6.1* 5.6*  ALBUMIN 2.5* 2.3*    CBG: Recent Labs  Lab 11/06/19 1223 11/06/19 2037 11/07/19 0628  GLUCAP 180* 112* 109*     Recent Results (from the past 240 hour(s))  SARS Coronavirus 2 by RT PCR (hospital order, performed in [facility] hospital lab) Nasopharyngeal Nasopharyngeal Swab     Status: None   Collection Time: 11/06/19 12:21 PM   Specimen: Nasopharyngeal Swab  Result Value Ref Range Status   SARS Coronavirus 2 NEGATIVE NEGATIVE Final    Comment: (NOTE) SARS-CoV-2 target nucleic acids are NOT DETECTED.  The SARS-CoV-2 RNA is generally detectable in upper and lower respiratory specimens during the acute phase of infection. The lowest concentration of SARS-CoV-2 viral copies this assay can detect is 250 copies / mL. A negative result does not preclude SARS-CoV-2 infection and should not be used as the sole basis for treatment or other patient management decisions.  A negative result may occur with improper specimen collection / handling, submission of specimen other than nasopharyngeal swab, presence of viral mutation(s) within the areas targeted by this assay, and inadequate number of viral copies (<250 copies / mL). A negative result must be combined with clinical observations, patient history, and epidemiological information.  Fact Sheet for Patients:   StrictlyIdeas.no  Fact Sheet for Healthcare  Providers: BankingDealers.co.za  This test is not yet approved or  cleared by the [place] FDA and has been authorized for detection and/or diagnosis of SARS-CoV-2 by FDA under an Emergency Use Authorization (EUA).  This EUA will remain in effect (meaning this test can be used) for the duration of the COVID-19 declaration under Section 564(b)(1) of the Act, 21 U.S.C. section 360bbb-3(b)(1), unless the authorization is terminated or revoked sooner.  Performed at [facility], [place] [address] [place], [place] [address]   Urine culture     Status: None   Collection Time: 11/06/19  4:38 PM   Specimen: Urine, Random  Result  Value Ref Range Status   Specimen Description URINE, RANDOM  Final   Special Requests NONE  Final   Culture   Final    NO GROWTH Performed at [facility], [address], [place], [place] [address]    Report Status 11/07/2019 FINAL  Final  Culture, blood (routine x 2)     Status: None (Preliminary result)   Collection Time: 11/06/19  8:37 PM   Specimen: BLOOD  Result Value Ref Range Status   Specimen Description BLOOD RIGHT ANTECUBITAL  Final   Special Requests   Final    BOTTLES DRAWN AEROBIC AND ANAEROBIC Blood Culture adequate volume   Culture   Final    NO GROWTH < 12 HOURS Performed at [facility], [place] [address] [place], [place] [address]    Report Status PENDING  Incomplete  Culture, blood (routine x 2)     Status: None (Preliminary result)   Collection Time: 11/06/19  8:40 PM   Specimen: BLOOD RIGHT HAND  Result Value Ref Range Status   Specimen Description BLOOD RIGHT HAND  Final   Special Requests   Final    BOTTLES DRAWN AEROBIC AND ANAEROBIC Blood Culture adequate volume   Culture   Final    NO GROWTH < 12 HOURS Performed at [facility], [place] [address], [place], [place] [address]    Report Status PENDING  Incomplete         Radiology Studies: DG Chest 2 View  Result Date:  11/06/2019 CLINICAL DATA:  Altered mental status.  Recent fall. EXAM: CHEST - 2 VIEW COMPARISON:  Chest x-ray dated November 24, 2014. FINDINGS: The heart size and mediastinal contours are within normal limits. Both lungs are clear. The visualized skeletal structures are unremarkable. IMPRESSION: No active cardiopulmonary disease. Electronically Signed   By: Titus Dubin M.D.   On: 11/06/2019 13:16   DG Shoulder Right  Result Date: 11/06/2019 CLINICAL DATA:  Right shoulder pain.  Recent fall. EXAM: RIGHT SHOULDER - 2+ VIEW COMPARISON:  None. FINDINGS: No acute fracture or dislocation. Moderate acromioclavicular and early glenohumeral osteoarthritis. Soft tissues are unremarkable. IMPRESSION: 1. No acute osseous abnormality.  Degenerative changes. Electronically Signed   By: Titus Dubin M.D.   On: 11/06/2019 13:21   DG Ankle Complete Left  Result Date: 11/06/2019 CLINICAL DATA:  Left ankle pain.  Recent fall. EXAM: LEFT ANKLE COMPLETE - 3+ VIEW COMPARISON:  None. FINDINGS: No acute fracture or dislocation. The ankle mortise is symmetric. The talar dome is intact. No tibiotalar joint effusion. Osteopenia. Diffuse soft tissue swelling. IMPRESSION: 1. Diffuse soft tissue swelling. No acute osseous abnormality. Electronically Signed   By: Titus Dubin M.D.   On: 11/06/2019 13:19   CT Head Wo Contrast  Result Date: 11/06/2019 CLINICAL DATA:  Head trauma, moderate/severe. Neck trauma. Generalized weakness. Fall on Saturday. EXAM: CT HEAD WITHOUT CONTRAST CT CERVICAL SPINE WITHOUT CONTRAST TECHNIQUE: Multidetector CT imaging of the head and cervical spine was performed following the standard protocol without intravenous contrast. Multiplanar CT image reconstructions of the cervical spine were also generated. COMPARISON:  No pertinent prior exams are available for comparison. FINDINGS: CT HEAD FINDINGS Brain: Mild generalized parenchymal atrophy. There is no acute intracranial hemorrhage. No demarcated  cortical infarct. No extra-axial fluid collection. No evidence of intracranial mass. No midline shift. Vascular: No hyperdense vessel. Atherosclerotic calcifications. Skull: Normal. Negative for fracture or focal lesion. Sinuses/Orbits: Visualized orbits show no acute finding. Mild ethmoid and right max  sinus mucosal thickening. Partially imaged left maxillary sinus mucous retention cyst. No significant mastoid effusion CT CERVICAL SPINE FINDINGS Alignment: Straightening of the expected cervical lordosis. Trace C3-C4 and C4-C5 grade 1 anterolisthesis. Rightward rotation of C1 upon C2 may be related to patient head positioning at the time of examination. Skull base and vertebrae: The basion-dental and atlanto-dental intervals are maintained.No evidence of acute fracture to the cervical spine. The bones are diffusely demineralized. Soft tissues and spinal canal: No prevertebral fluid or swelling. No visible canal hematoma. Disc levels: Cervical spondylosis with multilevel disc space narrowing, posterior disc osteophytes, uncovertebral and facet hypertrophy. Disc space narrowing is advanced throughout the cervical spine. No high-grade bony spinal canal stenosis. Multilevel neural foraminal narrowing. Also of note, there is ligamentous hypertrophy/pannus formation posterior to the dens. Upper chest: No consolidation within the imaged lung apices. No visible pneumothorax. IMPRESSION: CT head: 1. No evidence of acute intracranial abnormality. 2. Mild generalized parenchymal atrophy. 3. Mild ethmoid and right maxillary sinus mucosal thickening at the imaged levels. Partially imaged left maxillary sinus mucous retention cyst. CT cervical spine: 1. No evidence of acute fracture to the cervical spine. 2. Rightward rotation of C1 upon C2 may be related to patient head positioning at the time of examination. However, clinical correlation is recommended. 3. Mild C3-C4 and C4-C5 grade 1 anterolisthesis. 4. Cervical spondylosis as  described. 5. Diffuse osseous demineralization. Electronically Signed   By: Kellie Simmering DO   On: 11/06/2019 14:51   CT Angio Chest PE W and/or Wo Contrast  Result Date: 11/06/2019 CLINICAL DATA:  Generalized weakness EXAM: CT ANGIOGRAPHY CHEST WITH CONTRAST TECHNIQUE: Multidetector CT imaging of the chest was performed using the standard protocol during bolus administration of intravenous contrast. Multiplanar CT image reconstructions and MIPs were obtained to evaluate the vascular anatomy. CONTRAST:  74m OMNIPAQUE IOHEXOL 350 MG/ML SOLN COMPARISON:  2016 FINDINGS: Cardiovascular: Satisfactory opacification of the pulmonary arteries to the proximal segmental level. No evidence of acute pulmonary embolism. Normal heart size. No pericardial effusion. Coronary artery and thoracic aortic calcification. Mediastinum/Nodes: No enlarged lymph nodes identified. The thyroid is unremarkable Lungs/Pleura: Bilateral dependent atelectatic changes. No pleural effusion or pneumothorax. Upper Abdomen: No acute abnormality. Musculoskeletal: No acute osseous abnormality. Chronic loss of height at the inferior endplate of T7. Review of the MIP images confirms the above findings. IMPRESSION: No evidence of acute pulmonary embolism or other acute abnormality. Electronically Signed   By: PMacy MisM.D.   On: 11/06/2019 15:03   CT Cervical Spine Wo Contrast  Result Date: 11/06/2019 CLINICAL DATA:  Head trauma, moderate/severe. Neck trauma. Generalized weakness. Fall on Saturday. EXAM: CT HEAD WITHOUT CONTRAST CT CERVICAL SPINE WITHOUT CONTRAST TECHNIQUE: Multidetector CT imaging of the head and cervical spine was performed following the standard protocol without intravenous contrast. Multiplanar CT image reconstructions of the cervical spine were also generated. COMPARISON:  No pertinent prior exams are available for comparison. FINDINGS: CT HEAD FINDINGS Brain: Mild generalized parenchymal atrophy. There is no acute  intracranial hemorrhage. No demarcated cortical infarct. No extra-axial fluid collection. No evidence of intracranial mass. No midline shift. Vascular: No hyperdense vessel. Atherosclerotic calcifications. Skull: Normal. Negative for fracture or focal lesion. Sinuses/Orbits: Visualized orbits show no acute finding. Mild ethmoid and right max sinus mucosal thickening. Partially imaged left maxillary sinus mucous retention cyst. No significant mastoid effusion CT CERVICAL SPINE FINDINGS Alignment: Straightening of the expected cervical lordosis. Trace C3-C4 and C4-C5 grade 1 anterolisthesis. Rightward rotation of C1 upon C2 may be related  to patient head positioning at the time of examination. Skull base and vertebrae: The basion-dental and atlanto-dental intervals are maintained.No evidence of acute fracture to the cervical spine. The bones are diffusely demineralized. Soft tissues and spinal canal: No prevertebral fluid or swelling. No visible canal hematoma. Disc levels: Cervical spondylosis with multilevel disc space narrowing, posterior disc osteophytes, uncovertebral and facet hypertrophy. Disc space narrowing is advanced throughout the cervical spine. No high-grade bony spinal canal stenosis. Multilevel neural foraminal narrowing. Also of note, there is ligamentous hypertrophy/pannus formation posterior to the dens. Upper chest: No consolidation within the imaged lung apices. No visible pneumothorax. IMPRESSION: CT head: 1. No evidence of acute intracranial abnormality. 2. Mild generalized parenchymal atrophy. 3. Mild ethmoid and right maxillary sinus mucosal thickening at the imaged levels. Partially imaged left maxillary sinus mucous retention cyst. CT cervical spine: 1. No evidence of acute fracture to the cervical spine. 2. Rightward rotation of C1 upon C2 may be related to patient head positioning at the time of examination. However, clinical correlation is recommended. 3. Mild C3-C4 and C4-C5 grade 1  anterolisthesis. 4. Cervical spondylosis as described. 5. Diffuse osseous demineralization. Electronically Signed   By: Kellie Simmering DO   On: 11/06/2019 14:51   ECHOCARDIOGRAM COMPLETE  Result Date: 11/07/2019    ECHOCARDIOGRAM REPORT   Patient Name:   ATIA HAUPT Date of Exam: 11/07/2019 Medical Rec #:  [id number]      Height:       65.0 in Accession #:    [id number]     Weight:       175.0 lb Date of Birth:  [birth year]       BSA:          1.869 m Patient Age:    77 years       BP:           141/75 mmHg Patient Gender: F              HR:           107 bpm. Exam Location:  Inpatient Procedure: 3D Echo and 2D Echo Indications:    Murmur 785.2 / R01.1  History:        Patient has no prior history of Echocardiogram examinations.                 Risk Factors:Hypertension and Dyslipidemia. GERD. Accidental                 fall a few days ago, acute DVT in left.  Sonographer:    Darlina Sicilian RDCS Referring Phys: [place]  1. Left ventricular ejection fraction, by estimation, is 60 to 65%. The left ventricle has normal function. The left ventricle has no regional wall motion abnormalities. Left ventricular diastolic parameters are consistent with Grade I diastolic dysfunction (impaired relaxation).  2. Right ventricular systolic function is normal. The right ventricular size is normal. There is normal pulmonary artery systolic pressure.  3. The mitral valve is normal in structure. No evidence of mitral valve regurgitation. No evidence of mitral stenosis.  4. Tricuspid valve regurgitation is mild to moderate.  5. The aortic valve is normal in structure. Aortic valve regurgitation is not visualized. Mild aortic valve stenosis.  6. The inferior vena cava is normal in size with greater than 50% respiratory variability, suggesting right atrial pressure of 3 mmHg. FINDINGS  Left Ventricle: Left ventricular ejection fraction, by estimation, is 60 to 65%. The left ventricle has  normal function. The  left ventricle has no regional wall motion abnormalities. The left ventricular internal cavity size was normal in size. There is  no left ventricular hypertrophy. Left ventricular diastolic parameters are consistent with Grade I diastolic dysfunction (impaired relaxation). Normal left ventricular filling pressure. Right Ventricle: The right ventricular size is normal. No increase in right ventricular wall thickness. Right ventricular systolic function is normal. There is normal pulmonary artery systolic pressure. The tricuspid regurgitant velocity is 2.69 m/s, and  with an assumed right atrial pressure of 3 mmHg, the estimated right ventricular systolic pressure is 25.3 mmHg. Left Atrium: Left atrial size was normal in size. Right Atrium: Right atrial size was normal in size. Pericardium: There is no evidence of pericardial effusion. Mitral Valve: The mitral valve is normal in structure. Normal mobility of the mitral valve leaflets. Mild to moderate mitral annular calcification. No evidence of mitral valve regurgitation. No evidence of mitral valve stenosis. Tricuspid Valve: The tricuspid valve is normal in structure. Tricuspid valve regurgitation is mild to moderate. No evidence of tricuspid stenosis. Aortic Valve: The aortic valve is normal in structure.. There is mild thickening and mild calcification of the aortic valve. Aortic valve regurgitation is not visualized. Mild aortic stenosis is present. There is mild thickening of the aortic valve. There is mild calcification of the aortic valve. Aortic valve mean gradient measures 12.5 mmHg. Aortic valve peak gradient measures 19.9 mmHg. Aortic valve area, by VTI measures 1.83 cm. Pulmonic Valve: The pulmonic valve was normal in structure. Pulmonic valve regurgitation is not visualized. No evidence of pulmonic stenosis. Aorta: The aortic root is normal in size and structure. Venous: The inferior vena cava is normal in size with greater than 50% respiratory  variability, suggesting right atrial pressure of 3 mmHg. IAS/Shunts: There is redundancy of the interatrial septum. No atrial level shunt detected by color flow Doppler.  LEFT VENTRICLE PLAX 2D LVIDd:         4.10 cm  Diastology LVIDs:         2.70 cm  LV e' lateral:   8.05 cm/s LV PW:         1.00 cm  LV E/e' lateral: 8.3 LV IVS:        1.30 cm  LV e' medial:    6.64 cm/s LVOT diam:     2.10 cm  LV E/e' medial:  10.0 LV SV:         76 LV SV Index:   41 LVOT Area:     3.46 cm  RIGHT VENTRICLE RV S prime:     16.10 cm/s TAPSE (M-mode): 2.0 cm LEFT ATRIUM             Index       RIGHT ATRIUM          Index LA diam:        3.20 cm 1.71 cm/m  RA Area:     8.86 cm LA Vol (A2C):   50.7 ml 27.13 ml/m RA Volume:   12.10 ml 6.47 ml/m LA Vol (A4C):   45.8 ml 24.51 ml/m LA Biplane Vol: 52.4 ml 28.04 ml/m  AORTIC VALVE AV Area (Vmax):    1.85 cm AV Area (Vmean):   1.74 cm AV Area (VTI):     1.83 cm AV Vmax:           223.00 cm/s AV Vmean:          166.500 cm/s AV VTI:  0.414 m AV Peak Grad:      19.9 mmHg AV Mean Grad:      12.5 mmHg LVOT Vmax:         119.00 cm/s LVOT Vmean:        83.800 cm/s LVOT VTI:          0.219 m LVOT/AV VTI ratio: 0.53  AORTA Ao Root diam: 3.20 cm MITRAL VALVE                TRICUSPID VALVE MV Area (PHT): 5.38 cm     TR Peak grad:   28.9 mmHg MV Decel Time: 141 msec     TR Vmax:        269.00 cm/s MV E velocity: 66.50 cm/s MV A velocity: 106.00 cm/s  SHUNTS MV E/A ratio:  0.63         Systemic VTI:  0.22 m                             Systemic Diam: 2.10 cm Dani Gobble Croitoru MD Electronically signed by Sanda Klein MD Signature Date/Time: 11/07/2019/10:28:36 AM    Final    VAS [place] LOWER EXTREMITY VENOUS (DVT) (ONLY [facility] & [facility])  Result Date: 11/07/2019  Lower Venous DVTStudy Indications: Swelling. Other Indications: History of DVT. Comparison Study: 03-08-2017 Prior RT- partial chronic at [facility] Performing Technologist: Darlin Coco  Examination Guidelines: A complete evaluation includes  B-mode imaging, spectral Doppler, color Doppler, and power Doppler as needed of all accessible portions of each vessel. Bilateral testing is considered an integral part of a complete examination. Limited examinations for reoccurring indications may be performed as noted. The reflux portion of the exam is performed with the patient in reverse Trendelenburg.  +---------+---------------+---------+-----------+----------+-------------------+ RIGHT    CompressibilityPhasicitySpontaneityPropertiesThrombus Aging      +---------+---------------+---------+-----------+----------+-------------------+ [facility]      Full           Yes      Yes                  Significant fibrin                                                        stranding           +---------+---------------+---------+-----------+----------+-------------------+ SFJ      Full                                                             +---------+---------------+---------+-----------+----------+-------------------+ [facility] Prox  Full                                                             +---------+---------------+---------+-----------+----------+-------------------+ [facility]V Mid   Full                                                             +---------+---------------+---------+-----------+----------+-------------------+  FV DistalFull                                                             +---------+---------------+---------+-----------+----------+-------------------+ PFV      Full                                                             +---------+---------------+---------+-----------+----------+-------------------+ POP      Full           Yes      Yes                                      +---------+---------------+---------+-----------+----------+-------------------+ PTV      Full                                                              +---------+---------------+---------+-----------+----------+-------------------+ PERO     Full                                                             +---------+---------------+---------+-----------+----------+-------------------+   +---------+---------------+---------+-----------+----------+-------------------+ LEFT     CompressibilityPhasicitySpontaneityPropertiesThrombus Aging      +---------+---------------+---------+-----------+----------+-------------------+ [facility]      Full           Yes      Yes                  Significant fibrin                                                        stranding           +---------+---------------+---------+-----------+----------+-------------------+ SFJ      Full                                                             +---------+---------------+---------+-----------+----------+-------------------+ FV Prox  Full                                         Significant fibrin  stranding           +---------+---------------+---------+-----------+----------+-------------------+ FV Mid   Full                                                             +---------+---------------+---------+-----------+----------+-------------------+ FV DistalFull                                                             +---------+---------------+---------+-----------+----------+-------------------+ PFV      Full                                         Fibrin stranding    +---------+---------------+---------+-----------+----------+-------------------+ POP      None           No       No                   Age Indeterminate   +---------+---------------+---------+-----------+----------+-------------------+ PTV      Full                                                             +---------+---------------+---------+-----------+----------+-------------------+  PERO     Full                                                             +---------+---------------+---------+-----------+----------+-------------------+    Summary: RIGHT: - There is no evidence of deep vein thrombosis in the lower extremity.  - No cystic structure found in the popliteal fossa.  LEFT: - Findings consistent with age indeterminate deep vein thrombosis involving the left popliteal vein. - A cystic structure is found in the popliteal fossa.  *See table(s) above for measurements and observations. Electronically signed by Servando Snare MD on 11/07/2019 at 8:12:12 AM.    Final         Scheduled Meds: . Chlorhexidine Gluconate Cloth  6 each Topical Daily  . colchicine  0.6 mg Oral BID  . docusate sodium  100 mg Oral BID  . loratadine  10 mg Oral Daily  . mometasone-formoterol  2 puff Inhalation BID  . montelukast  10 mg Oral QHS  . multivitamin with minerals  1 tablet Oral Daily  . polyethylene glycol  17 g Oral BID   Continuous Infusions: . sodium chloride 75 mL/hr at 11/07/19 1235  .  ceFAZolin (ANCEF) IV    . heparin 1,800 Units/hr (11/07/19 1417)  . magnesium sulfate bolus IVPB 2 g (11/07/19 1416)     LOS: 1 day        Hosie Poisson, MD [facility]   To contact the attending provider between 7A-7P or  the covering provider during after hours 7P-7A, please log into the web site [email or web address] and access using universal [place] password for that web site. If you do not have the password, please call the hospital operator.  11/07/2019, 2:54 PM

## 2019-11-07 NOTE — Progress Notes (Signed)
PT Cancellation Note  Patient Details Name: PATSEY PITSTICK MRN: [id number] DOB: 1933-04-12   Cancelled Treatment:    Reason Eval/Treat Not Completed: Medical issues which prohibited therapy Pt with LLE DVT and started on heparin on 8/12 at 1722. Per protocol, must wait 24 hours prior to initiation of therapy from initial dose of heparin. Will follow up as pt medically appropriate and as schedule allows.   Reuel Derby, PT, DPT  Acute Rehabilitation Services  Pager: [phone] Office: [phone]    Rudean Hitt 11/07/2019, 4:02 PM

## 2019-11-07 NOTE — Progress Notes (Signed)
[place] for Heparin Indication: DVT  Allergies  Allergen Reactions  . Ativan [Lorazepam] Anaphylaxis and Other (See Comments)    Deathly allergic; "resp rate dropped dropped to 6"  . Other Anaphylaxis    Red peppers--choking, also  . Azithromycin Diarrhea  . Benadryl [Diphenhydramine Hcl] Other (See Comments)    Paradoxical response  . Ciprofloxacin Hcl Other (See Comments)    Causes yeast infection and refuses to take  . Codeine Phosphate Nausea Only    Can tolerate in limited amounts  . Hydrocodone Itching and Nausea And Vomiting  . Hydrocodone-Homatropine Itching and Nausea And Vomiting  . Levaquin [Levofloxacin In D5w] Other (See Comments)    Muscle soreness  . Levofloxacin   . Meperidine Hcl Other (See Comments)    Reaction ??  . Molds & Smuts Other (See Comments)    unsure  . Sulfa Antibiotics Other (See Comments)    Joint pain  . Tape Other (See Comments)    No Coban wrap (per the patient)  . Tramadol   . Ultram [Tramadol Hcl] Other (See Comments)    "Can't move joints"    Patient Measurements: Height: 5\' 5"  (165.1 cm) Weight: 79.4 kg (175 lb) (stated by pt) IBW/kg (Calculated) : 57 Heparin Dosing Weight: 73.7 kg  Vital Signs: Temp: 98.5 F (36.9 C) (08/13 0925) Temp Source: Oral (08/13 0925) BP: 123/62 (08/13 0925) Pulse Rate: 101 (08/13 0925)  Labs: Recent Labs    11/06/19 1217 11/06/19 1836 11/07/19 0222 11/07/19 1133  HGB 10.2*  --  9.8*  --   HCT 31.7*  --  30.4*  --   PLT 263  --  260  --   HEPARINUNFRC  --   --  <0.10* 0.18*  CREATININE 0.52  --  0.52  --   CKTOTAL  --   --  28*  --   TROPONINIHS 16 34*  --   --     Estimated Creatinine Clearance: 54.5 mL/min (by C-G formula based on SCr of 0.52 mg/dL).   Medical History: Past Medical History:  Diagnosis Date  . ALLERGIC RHINITIS   . ANEMIA, CHRONIC    Malabsorption related to bypass hx, B12 and iron deficiency  . ANXIETY   . Arthritis   .  ASTHMA   . Bariatric surgery status   . Bowel perforation ([place]) 2016  . C. difficile diarrhea   . Colon perforation ([place]) 08/2014   following polypetomy during colo, surgical repair  . COLONIC POLYPS, ADENOMATOUS, HX OF 2010  . DIVERTICULOSIS, COLON   . DVT (deep venous thrombosis) ([place]) 2018  . DVT (deep venous thrombosis) ([place])   . GERD (gastroesophageal reflux disease) 08/23/2015  . Graded compression stocking in place   . History of blood transfusion   . History of migraine   . History of rheumatic fever   . HSV (herpes simplex virus) infection 08/16/2015  . Hyperlipidemia   . Hypertension    Denies  . Hyponatremia 05/02/2016  . Incomplete left bundle branch block (LBBB) 2016   Noted on EKG  . INSOMNIA   . Lumbosacral disc disease    Chronic pain; History of osteomyelitis 2010 following ESI complication  . Obesity   . Osteomyelitis ([place])   . Pelvic fracture ([place])   . Pneumonia   . Recurrent UTI 08/23/2015  . URINARY URGENCY   . Ventral hernia without obstruction or gangrene 05/22/2018  . VITAMIN B12 DEFICIENCY     Medications:  Scheduled:  .  colchicine  0.6 mg Oral BID  . docusate sodium  100 mg Oral BID  . loratadine  10 mg Oral Daily  . mometasone-formoterol  2 puff Inhalation BID  . montelukast  10 mg Oral QHS  . multivitamin with minerals  1 tablet Oral Daily  . polyethylene glycol  17 g Oral BID  . potassium chloride  40 mEq Oral Once    Assessment: 72 yof presents with c/o left foot and ankle pain, found to have DVT. Pharmacy has been asked to dose heparin. Patient was previously on Xarelto ~1 month ago post-ortho procedure; however, this was only for 14 days. Currently on no anticoagulation PTA.  Initial heparin level subtherapeutic at 0.18. CBC stable. No bleeding or issues with infusion per discussion with RN.  Goal of Therapy:  Heparin level 0.3-0.7 units/ml Monitor platelets by anticoagulation protocol: Yes   Plan:  Increase heparin IV to 1800  units/hr Check heparin level in 8 hrs Monitor daily heparin level and CBC, s/sx bleeding F/u long-term anticoagulation plan   Arturo Morton, PharmD, BCPS Please check AMION for all [place] contact numbers Clinical Pharmacist 11/07/2019 1:16 PM

## 2019-11-07 NOTE — TOC Benefit Eligibility Note (Signed)
Transition of Care [facility]) Benefit Eligibility Note    Patient Details  Name: HEVIN JEFFCOAT MRN: [id number] Date of Birth: [birth year]   Medication/Dose: Arne [place] 5 MG BID  Covered?: Yes  Tier: 3 Drug  Prescription Coverage Preferred Pharmacy: [facility] with Person/Company/Phone Number:: JOY    @  [employer]  EB # [phone] OPT- 2  Co-Pay: $ 45.00  Prior Approval: No  Deductible: Met       Memory Argue Phone Number: 11/07/2019, 2:50 PM

## 2019-11-07 NOTE — Progress Notes (Signed)
  Echocardiogram 2D Echocardiogram has been performed.  Alexis Olson M 11/07/2019, 8:34 AM

## 2019-11-07 NOTE — Progress Notes (Signed)
Right upper extremity venous duplex has been completed. Preliminary results can be found in CV Proc through chart review.   11/07/19 3:35 PM Alexis Olson RVT

## 2019-11-08 ENCOUNTER — Inpatient Hospital Stay (HOSPITAL_COMMUNITY): Payer: PPO

## 2019-11-08 LAB — CBC
HCT: 29.1 % — ABNORMAL LOW (ref 36.0–46.0)
Hemoglobin: 9.5 g/dL — ABNORMAL LOW (ref 12.0–15.0)
MCH: 29.2 pg (ref 26.0–34.0)
MCHC: 32.6 g/dL (ref 30.0–36.0)
MCV: 89.5 fL (ref 80.0–100.0)
Platelets: 296 10*3/uL (ref 150–400)
RBC: 3.25 MIL/uL — ABNORMAL LOW (ref 3.87–5.11)
RDW: 14.6 % (ref 11.5–15.5)
WBC: 10.2 10*3/uL (ref 4.0–10.5)
nRBC: 0 % (ref 0.0–0.2)

## 2019-11-08 LAB — BASIC METABOLIC PANEL
Anion gap: 10 (ref 5–15)
BUN: 6 mg/dL — ABNORMAL LOW (ref 8–23)
CO2: 26 mmol/L (ref 22–32)
Calcium: 7.9 mg/dL — ABNORMAL LOW (ref 8.9–10.3)
Chloride: 96 mmol/L — ABNORMAL LOW (ref 98–111)
Creatinine, Ser: 0.48 mg/dL (ref 0.44–1.00)
GFR calc Af Amer: >60 mL/min (ref >60)
GFR calc non Af Amer: >60 mL/min (ref >60)
Glucose, Bld: 124 mg/dL — ABNORMAL HIGH (ref 70–99)
Potassium: 3.4 mmol/L — ABNORMAL LOW (ref 3.5–5.1)
Sodium: 132 mmol/L — ABNORMAL LOW (ref 135–145)

## 2019-11-08 LAB — PROCALCITONIN: Procalcitonin: 0.1 ng/mL

## 2019-11-08 LAB — C-REACTIVE PROTEIN: CRP: 17.4 mg/dL — ABNORMAL HIGH (ref <1.0)

## 2019-11-08 LAB — HEPARIN LEVEL (UNFRACTIONATED): Heparin Unfractionated: 0.35 IU/mL (ref 0.30–0.70)

## 2019-11-08 LAB — SEDIMENTATION RATE: Sed Rate: 104 mm/hr — ABNORMAL HIGH (ref 0–22)

## 2019-11-08 MED ORDER — APIXABAN 5 MG PO TABS
10.0000 mg | ORAL_TABLET | Freq: Two times a day (BID) | ORAL | Status: AC
  Administered 2019-11-08 – 2019-11-14 (×14): 10 mg via ORAL
  Filled 2019-11-08 (×16): qty 2

## 2019-11-08 MED ORDER — MORPHINE SULFATE (PF) 2 MG/ML IV SOLN: 1.0000 mg | INTRAVENOUS | Status: DC | PRN

## 2019-11-08 MED ORDER — FERROUS SULFATE 325 (65 FE) MG PO TABS
325.0000 mg | ORAL_TABLET | Freq: Two times a day (BID) | ORAL | Status: DC
  Administered 2019-11-08 – 2019-11-16 (×17): 325 mg via ORAL
  Filled 2019-11-08 (×18): qty 1

## 2019-11-08 MED ORDER — APIXABAN 5 MG PO TABS
5.0000 mg | ORAL_TABLET | Freq: Two times a day (BID) | ORAL | Status: DC
  Administered 2019-11-15 – 2019-11-16 (×4): 5 mg via ORAL
  Filled 2019-11-08 (×4): qty 1

## 2019-11-08 MED ORDER — POTASSIUM CHLORIDE CRYS ER 20 MEQ PO TBCR
40.0000 meq | EXTENDED_RELEASE_TABLET | Freq: Once | ORAL | Status: AC
  Administered 2019-11-08: 40 meq via ORAL
  Filled 2019-11-08: qty 2

## 2019-11-08 MED ORDER — GADOBUTROL 1 MMOL/ML IV SOLN
8.0000 mL | Freq: Once | INTRAVENOUS | Status: AC | PRN
  Administered 2019-11-08: 8 mL via INTRAVENOUS

## 2019-11-08 NOTE — Consult Note (Signed)
Reason for Consult:?cellulitis Referring Physician: Hospitalist  CC:My leg hurts  HPI:  Alexis Olson is an 86 y.o. right handed female who presents following fall, where she fell onto her legs, catching herself with her hands .  Work up revealed DVT LLE.  Recently noticed swelling to R hand, concern for cellulitis.   Pain reports mild discomfort and swelling to R hand and fingers, difficult to grasp objects Associated signs/symptoms:DVT LLE, arthritis, no h/o gout per patient Previous treatment:  Started on Heparin for DVT, Ancef   Past Medical History:  Diagnosis Date  . ALLERGIC RHINITIS   . ANEMIA, CHRONIC    Malabsorption related to bypass hx, B12 and iron deficiency  . ANXIETY   . Arthritis   . ASTHMA   . Bariatric surgery status   . Bowel perforation ([place]) 2016  . C. difficile diarrhea   . Colon perforation ([place]) 08/2014   following polypetomy during colo, surgical repair  . COLONIC POLYPS, ADENOMATOUS, HX OF 2010  . DIVERTICULOSIS, COLON   . DVT (deep venous thrombosis) ([place]) 2018  . DVT (deep venous thrombosis) ([place])   . GERD (gastroesophageal reflux disease) 08/23/2015  . Graded compression stocking in place   . History of blood transfusion   . History of migraine   . History of rheumatic fever   . HSV (herpes simplex virus) infection 08/16/2015  . Hyperlipidemia   . Hypertension    Denies  . Hyponatremia 05/02/2016  . Incomplete left bundle branch block (LBBB) 2016   Noted on EKG  . INSOMNIA   . Lumbosacral disc disease    Chronic pain; History of osteomyelitis 2010 following ESI complication  . Obesity   . Osteomyelitis ([place])   . Pelvic fracture ([place])   . Pneumonia   . Recurrent UTI 08/23/2015  . URINARY URGENCY   . Ventral hernia without obstruction or gangrene 05/22/2018  . VITAMIN B12 DEFICIENCY     Past Surgical History:  Procedure Laterality Date  . [place]   for ruptured disc  . CHOLECYSTECTOMY    . COLONOSCOPY    . greenfield filter     . ileojejunal bypass  1976   for obesity  . LAPAROTOMY N/A 09/18/2014   Procedure: EXPLORATORY LAPAROTOMY WITH REPAIR OF CECIAL PERFORATION;  Surgeon: Autumn Messing III, MD;  Location: [facility];  Service: General;  Laterality: N/A;  . TONSILLECTOMY    . TOTAL KNEE ARTHROPLASTY Right 08/26/2019   Procedure: RIGHT TOTAL KNEE ARTHROPLASTY, LEFT KNEE CORTISONE INJECTION;  Surgeon: Paralee Cancel, MD;  Location: [facility];  Service: Orthopedics;  Laterality: Right;  70 MINS  . UPPER GI ENDOSCOPY    . VASCULAR SURGERY Right    revascularization right lower extremity    Family History  Problem Relation Age of Onset  . Cervical cancer Mother   . Stroke Mother   . Liver disease Sister   . Kidney disease Sister   . Cirrhosis Sister   . Colon cancer Neg Hx   . Esophageal cancer Neg Hx   . Rectal cancer Neg Hx   . Stomach cancer Neg Hx     Social History:  reports that she has never smoked. She has never used smokeless tobacco. She reports that she does not drink alcohol and does not use drugs.  Allergies:  Allergies  Allergen Reactions  . Ativan [Lorazepam] Anaphylaxis and Other (See Comments)    Deathly allergic; "resp rate dropped dropped to 6"  . Other Anaphylaxis  Red peppers--choking, also  . Azithromycin Diarrhea  . Benadryl [Diphenhydramine Hcl] Other (See Comments)    Paradoxical response  . Ciprofloxacin Hcl Other (See Comments)    Causes yeast infection and refuses to take  . Codeine Phosphate Nausea Only    Can tolerate in limited amounts  . Hydrocodone Itching and Nausea And Vomiting  . Hydrocodone-Homatropine Itching and Nausea And Vomiting  . Levaquin [Levofloxacin In D5w] Other (See Comments)    Muscle soreness  . Levofloxacin   . Meperidine Hcl Other (See Comments)    Reaction ??  . Molds & Smuts Other (See Comments)    unsure  . Sulfa Antibiotics Other (See Comments)    Joint pain  . Tape Other (See Comments)    No Coban wrap (per the patient)  . Tramadol   . Ultram  [Tramadol Hcl] Other (See Comments)    "Can't move joints"      Pertinent items are noted in HPI. Temp:  [98.5 F (36.9 C)-99 F (37.2 C)] 99 F (37.2 C) (08/14 2014) Pulse Rate:  [100-104] 100 (08/14 2014) Resp:  [14-20] 20 (08/14 2014) BP: (111-143)/(41-88) 111/41 (08/14 2014) SpO2:  [94 %-97 %] 95 % (08/14 2047) General appearance: alert and cooperative Resp: clear to auscultation bilaterally Cardio: regular rate and rhythm GI: soft, non-tender; bowel sounds normal; no masses,  no organomegaly Extremities: R Hand and fingers with mild diffuse swelling, evidence of arthritis, limited full fist, palpation to volar aspect does not reveal tenderness, mild tenderness doral hand, no real tenderness over snuff box or with wrist rom No lacerations or abrasions; Left hand wnl with exception of arthritic changes in fingers  Assessment: R Hand swelling - doubt septic wrist or cellulitis, probably related to trauma from fall with underlying arthritis Plan: Elevate, wrist splint for comfort, no need for surgical intervention at this time.   Omega Slager C [name] 11/08/2019, 10:12 PM

## 2019-11-08 NOTE — Progress Notes (Addendum)
[place] for Heparin, Eliquis Indication: DVT  Allergies  Allergen Reactions  . Ativan [Lorazepam] Anaphylaxis and Other (See Comments)    Deathly allergic; "resp rate dropped dropped to 6"  . Other Anaphylaxis    Red peppers--choking, also  . Azithromycin Diarrhea  . Benadryl [Diphenhydramine Hcl] Other (See Comments)    Paradoxical response  . Ciprofloxacin Hcl Other (See Comments)    Causes yeast infection and refuses to take  . Codeine Phosphate Nausea Only    Can tolerate in limited amounts  . Hydrocodone Itching and Nausea And Vomiting  . Hydrocodone-Homatropine Itching and Nausea And Vomiting  . Levaquin [Levofloxacin In D5w] Other (See Comments)    Muscle soreness  . Levofloxacin   . Meperidine Hcl Other (See Comments)    Reaction ??  . Molds & Smuts Other (See Comments)    unsure  . Sulfa Antibiotics Other (See Comments)    Joint pain  . Tape Other (See Comments)    No Coban wrap (per the patient)  . Tramadol   . Ultram [Tramadol Hcl] Other (See Comments)    "Can't move joints"    Patient Measurements: Height: 5\' 5"  (165.1 cm) Weight: 79.4 kg (175 lb) (stated by pt) IBW/kg (Calculated) : 57 Heparin Dosing Weight: 73.7 kg  Vital Signs: Temp: 98.7 F (37.1 C) (08/14 0519) Temp Source: Oral (08/14 0519) BP: 135/69 (08/14 0519) Pulse Rate: 104 (08/14 0519)  Labs: Recent Labs    11/06/19 1217 11/06/19 1217 11/06/19 1836 11/07/19 0222 11/07/19 0222 11/07/19 1133 11/07/19 2246 11/08/19 0328 11/08/19 0713  HGB 10.2*   < >  --  9.8*  --   --   --  9.5*  --   HCT 31.7*  --   --  30.4*  --   --   --  29.1*  --   PLT 263  --   --  260  --   --   --  296  --   HEPARINUNFRC  --   --   --  <0.10*   < > 0.18* 0.26*  --  0.35  CREATININE 0.52  --   --  0.52  --   --   --  0.48  --   CKTOTAL  --   --   --  28*  --   --   --   --   --   TROPONINIHS 16  --  34*  --   --   --   --   --   --    < > = values in this interval  not displayed.    Estimated Creatinine Clearance: 54.5 mL/min (by C-G formula based on SCr of 0.48 mg/dL).   Medical History: Past Medical History:  Diagnosis Date  . ALLERGIC RHINITIS   . ANEMIA, CHRONIC    Malabsorption related to bypass hx, B12 and iron deficiency  . ANXIETY   . Arthritis   . ASTHMA   . Bariatric surgery status   . Bowel perforation ([place]) 2016  . C. difficile diarrhea   . Colon perforation ([place]) 08/2014   following polypetomy during colo, surgical repair  . COLONIC POLYPS, ADENOMATOUS, HX OF 2010  . DIVERTICULOSIS, COLON   . DVT (deep venous thrombosis) ([place]) 2018  . DVT (deep venous thrombosis) ([place])   . GERD (gastroesophageal reflux disease) 08/23/2015  . Graded compression stocking in place   . History of blood transfusion   . History of migraine   .  History of rheumatic fever   . HSV (herpes simplex virus) infection 08/16/2015  . Hyperlipidemia   . Hypertension    Denies  . Hyponatremia 05/02/2016  . Incomplete left bundle branch block (LBBB) 2016   Noted on EKG  . INSOMNIA   . Lumbosacral disc disease    Chronic pain; History of osteomyelitis 2010 following ESI complication  . Obesity   . Osteomyelitis ([place])   . Pelvic fracture ([place])   . Pneumonia   . Recurrent UTI 08/23/2015  . URINARY URGENCY   . Ventral hernia without obstruction or gangrene 05/22/2018  . VITAMIN B12 DEFICIENCY     Medications:  Scheduled:  . Chlorhexidine Gluconate Cloth  6 each Topical Daily  . colchicine  0.6 mg Oral BID  . docusate sodium  100 mg Oral BID  . loratadine  10 mg Oral Daily  . mometasone-formoterol  2 puff Inhalation BID  . montelukast  10 mg Oral QHS  . multivitamin with minerals  1 tablet Oral Daily  . polyethylene glycol  17 g Oral BID    Assessment: 66 yof presents with c/o left foot and ankle pain, found to have DVT. Pharmacy has been asked to dose heparin. Patient was previously on Xarelto ~1 month ago post-ortho procedure; however, this was  only for 14 days. Currently on no anticoagulation PTA. CBC stable. No bleeding noted.   Heparin level therapeutic on 1950 units/hr.   Goal of Therapy:  Heparin level 0.3-0.7 units/ml Monitor platelets by anticoagulation protocol: Yes   Plan:  Continue heparin at 1950 units/hr Check heparin level in 8 hrs Monitor daily heparin level and CBC, s/sx bleeding F/u long-term anticoagulation plan  ADDENDUM: Pharmacy has been consulted to dose Eliquis. Patient was therapeutic for less than one day on heparin for DVT.   Plan:  Stop heparin Start apixaban 10 mg twice daily for 7 days followed by apixaban 5mg  twice daily  Monitor CBC, s/sx bleeding  Romilda Garret, PharmD PGY1 Acute Care Pharmacy Resident Phone: [phone] 11/08/2019 7:50 AM  Please check [email or web address] for unit specific pharmacy phone numbers.

## 2019-11-08 NOTE — Progress Notes (Signed)
PT Cancellation Note  Patient Details Name: Alexis Olson MRN: [id number] DOB: [birth year]   Cancelled Treatment:    Reason Eval/Treat Not Completed: Other (comment).  Mult areas of pain with R hand and Lleg esp, refusing to be OOB.  Pt agreed to try tomorrow.   Ramond Dial 11/08/2019, 4:09 PM   Mee Hives, PT MS Acute Rehab Dept. Number: [place] [facility] [place]

## 2019-11-08 NOTE — Progress Notes (Signed)
PROGRESS NOTE    Alexis Olson  VVZ:[id number] DOB: 06/01/1933 DOA: 11/06/2019 PCP: Mosie Lukes, MD    Chief Complaint  Patient presents with  . Foot Pain  . Weakness    Brief Narrative:   86 year old lady with prior history of hypertension, GERD, diverticulosis, vitamin B12 deficiency, hyperlipidemia, anxiety disorder, recurrent urinary tract infection, resolved chronic low back pain presents to ED with an accidental fall at home several days prior to arrival to ED. Complaints of pain in the left foot and ankle.  On arrival patient was lethargic and confused.  X-rays of the ankle did not reveal any fracture.  Venous duplex of the lower extremity reveal an acute DVT of the left lower extremity. CT angiogram was negative for PE. Pt reports pain in the right hand, x rays show Mallet finger deformity of the index finger with possible Boutonniere deformity of the right little finger. Superior dislocation left fifth DIP.  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 She was started on pain meds, splint. Pt reports persistent pain and swelling . MRI of the hand shows Diffuse dorsal subcutaneous edema and increased signal throughout the muscles which could be due to myositis and cellulitis. No loculated fluid collections or evidence of osteomyelitis. Mild third and fourth digit flexor digitorum tenosynovitis. PT evaluation ordered today.  Pt seen and examined . She reports persistent pain in the hand.    Assessment & Plan:   Active Problems:   Toxic metabolic encephalopathy   Metabolic encephalopathy and encephalopathy sec to polypharmacy.  Much improved, she is alert and oriented and answering all questions appropriately.    Accidental fall at home.  CK levels minimally elevated.  Initial CT head and CT cervical spine ruled out fractures.  PT evaluation.    GERD  Stable.    DVT  Resume IV heparin, possible transition to oral  eliquis in am.    Hypokalemia and hypomagnesemia Replaced.   Right hand swelling Differential include cellulitis vs soft tissues swelling from the fall.  Started the patient empirically on IV Ancef, colchicine,  Reviewed the x rays and MRI of the hand personally.  Afebrile today with mild leukocytosis.  CRP elevatd at 17.4, ESR at 104, pro calcitonin is 0.10  Hyponatremia:  Probably from dehydration.  Improving.    Anemia of chronic disease:  Anemia panel reviewed.  Iron deficiency,  Supplementation will be added.   Essential hypertension:  Well controlled.    Hypokalemia:  Replaced.      DVT prophylaxis: (Heparin) Code Status: full code.  Family Communication: none at bedside. Discussed with daughter over the phone.  Disposition:   Status is: Inpatient  Remains inpatient appropriate because:Ongoing diagnostic testing needed not appropriate for outpatient work up and IV treatments appropriate due to intensity of illness or inability to take PO   Dispo: The patient is from: Home              Anticipated d/c is to: pending.              Anticipated d/c date is: > 3 days              Patient currently is not medically stable to d/c.       Consultants:   None.   Procedures: none.  Antimicrobials: Antibiotics Given (last 72 hours)    Date/Time Action Medication Dose Rate   11/06/19 1713 New Bag/Given   cefTRIAXone (ROCEPHIN) 1 g  in sodium chloride 0.9 % 100 mL IVPB 1 g 200 mL/hr   11/07/19 1709 New Bag/Given   ceFAZolin (ANCEF) IVPB 1 g/50 mL premix 1 g 100 mL/hr   11/08/19 0033 New Bag/Given   ceFAZolin (ANCEF) IVPB 1 g/50 mL premix 1 g 100 mL/hr       Subjective: Persistent right hand swelling, no chest pain or sob.    Objective: Vitals:   11/08/19 0027 11/08/19 0519 11/08/19 0807 11/08/19 1426  BP: 130/88 135/69 129/73 (!) 143/86  Pulse: (!) 103 (!) 104 (!) 103 (!) 102  Resp: 17 18 14 15   Temp: 99 F (37.2 C) 98.7 F (37.1 C) 98.8 F  (37.1 C) 98.5 F (36.9 C)  TempSrc: Oral Oral Oral Oral  SpO2: 96% 94% 94% 97%  Weight:      Height:        Intake/Output Summary (Last 24 hours) at 11/08/2019 1602 Last data filed at 11/08/2019 0830 Gross per 24 hour  Intake 100 ml  Output 301 ml  Net -201 ml   Filed Weights   11/06/19 1206 11/06/19 1909  Weight: 79.8 kg 79.4 kg    Examination:  General exam: Alert and comfortable not in any kind of distress Respiratory system: Clear to auscultation bilaterally, no wheezing or rhonchi Cardiovascular system: S1-S2 heard, regular rate rhythm, no JVD Gastrointestinal system: Abdomen is soft, nontender nondistended bowel sounds normal Central nervous system: Alert and oriented, grossly nonfocal Extremities: Bilateral pedal edema, right hand swelling and tenderness. Skin: No rashes seen Psychiatry: Mood is appropriate    Data Reviewed: I have personally reviewed following labs and imaging studies  CBC: Recent Labs  Lab 11/06/19 1217 11/07/19 0222 11/08/19 0328  WBC 11.5* 10.8* 10.2  NEUTROABS 9.2*  --   --   HGB 10.2* 9.8* 9.5*  HCT 31.7* 30.4* 29.1*  MCV 93.0 91.8 89.5  PLT 263 260 701    Basic Metabolic Panel: Recent Labs  Lab 11/06/19 1217 11/06/19 1836 11/07/19 0222 11/08/19 0328  NA 132*  --  135 132*  K 3.0*  --  3.3* 3.4*  CL 96*  --  99 96*  CO2 28  --  26 26  GLUCOSE 190*  --  132* 124*  BUN 10  --  8 6*  CREATININE 0.52  --  0.52 0.48  CALCIUM 8.4*  --  8.2* 7.9*  MG  --  1.4*  --   --     GFR: Estimated Creatinine Clearance: 54.5 mL/min (by C-G formula based on SCr of 0.48 mg/dL).  Liver Function Tests: Recent Labs  Lab 11/06/19 1217 11/07/19 0222  AST 21 27  ALT 20 28  ALKPHOS 47 47  BILITOT 0.5 0.5  PROT 6.1* 5.6*  ALBUMIN 2.5* 2.3*    CBG: Recent Labs  Lab 11/06/19 1223 11/06/19 2037 11/07/19 0628  GLUCAP 180* 112* 109*     Recent Results (from the past 240 hour(s))  SARS Coronavirus 2 by RT PCR (hospital order,  performed in [facility] hospital lab) Nasopharyngeal Nasopharyngeal Swab     Status: None   Collection Time: 11/06/19 12:21 PM   Specimen: Nasopharyngeal Swab  Result Value Ref Range Status   SARS Coronavirus 2 NEGATIVE NEGATIVE Final    Comment: (NOTE) SARS-CoV-2 target nucleic acids are NOT DETECTED.  The SARS-CoV-2 RNA is generally detectable in upper and lower respiratory specimens during the acute phase of infection. The lowest concentration of SARS-CoV-2 viral copies this assay can detect is 250 copies /  mL. A negative result does not preclude SARS-CoV-2 infection and should not be used as the sole basis for treatment or other patient management decisions.  A negative result may occur with improper specimen collection / handling, submission of specimen other than nasopharyngeal swab, presence of viral mutation(s) within the areas targeted by this assay, and inadequate number of viral copies (<250 copies / mL). A negative result must be combined with clinical observations, patient history, and epidemiological information.  Fact Sheet for Patients:   StrictlyIdeas.no  Fact Sheet for Healthcare Providers: BankingDealers.co.za  This test is not yet approved or  cleared by the [place] FDA and has been authorized for detection and/or diagnosis of SARS-CoV-2 by FDA under an Emergency Use Authorization (EUA).  This EUA will remain in effect (meaning this test can be used) for the duration of the COVID-19 declaration under Section 564(b)(1) of the Act, 21 U.S.C. section 360bbb-3(b)(1), unless the authorization is terminated or revoked sooner.  Performed at [facility], [place] [address] [place], [place] [address]   Urine culture     Status: None   Collection Time: 11/06/19  4:38 PM   Specimen: Urine, Random  Result Value Ref Range Status   Specimen Description URINE, RANDOM  Final   Special Requests NONE  Final    Culture   Final    NO GROWTH Performed at [facility], [place] [address], [place], [place] [address]    Report Status 11/07/2019 FINAL  Final  Culture, blood (routine x 2)     Status: None (Preliminary result)   Collection Time: 11/06/19  8:37 PM   Specimen: BLOOD  Result Value Ref Range Status   Specimen Description BLOOD RIGHT ANTECUBITAL  Final   Special Requests   Final    BOTTLES DRAWN AEROBIC AND ANAEROBIC Blood Culture adequate volume   Culture   Final    NO GROWTH 2 DAYS Performed at [facility], [place] [address], [place], [place] [address]    Report Status PENDING  Incomplete  Culture, blood (routine x 2)     Status: None (Preliminary result)   Collection Time: 11/06/19  8:40 PM   Specimen: BLOOD RIGHT HAND  Result Value Ref Range Status   Specimen Description BLOOD RIGHT HAND  Final   Special Requests   Final    BOTTLES DRAWN AEROBIC AND ANAEROBIC Blood Culture adequate volume   Culture   Final    NO GROWTH 2 DAYS Performed at [facility], [place] [address] [place], [place] [address]    Report Status PENDING  Incomplete         Radiology Studies: DG Hand Complete Right  Result Date: 11/07/2019 CLINICAL DATA:  Hand swelling EXAM: RIGHT HAND - COMPLETE 3+ VIEW COMPARISON:  None. FINDINGS: Three view radiograph right hand demonstrates a mallet finger deformity of the a index finger and a a possible boutonniere deformity of the right little finger with superior dislocation of the distal interphalangeal joint. There is superimposed advanced degenerative arthritis of the D IP and PIP joints of the digits, the interphalangeal joint of the thumb, the first and second MCP joints, and the first CMC joint at the base of the thumb. Mild to moderate degenerative changes are not all so noted at the radiocarpal articulation. No acute fracture. There is marked diffuse soft tissue swelling noted of the right hand. IMPRESSION: 1. Mallet finger deformity of the index finger  with possible Boutonniere deformity of the right  little finger. Superior dislocation left fifth DIP. 2. 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 3. Diffuse soft tissue swelling.  No definite fracture identified. Electronically Signed   By: Fidela Salisbury MD   On: 11/07/2019 17:14   ECHOCARDIOGRAM COMPLETE  Result Date: 11/07/2019    ECHOCARDIOGRAM REPORT   Patient Name:   Alexis Olson Date of Exam: 11/07/2019 Medical Rec #:  [id number]      Height:       65.0 in Accession #:    [id number]     Weight:       175.0 lb Date of Birth:  1933-09-13       BSA:          1.869 m Patient Age:    13 years       BP:           141/75 mmHg Patient Gender: F              HR:           107 bpm. Exam Location:  Inpatient Procedure: 3D Echo and 2D Echo Indications:    Murmur 785.2 / R01.1  History:        Patient has no prior history of Echocardiogram examinations.                 Risk Factors:Hypertension and Dyslipidemia. GERD. Accidental                 fall a few days ago, acute DVT in left.  Sonographer:    Darlina Sicilian RDCS Referring Phys: [place]  1. Left ventricular ejection fraction, by estimation, is 60 to 65%. The left ventricle has normal function. The left ventricle has no regional wall motion abnormalities. Left ventricular diastolic parameters are consistent with Grade I diastolic dysfunction (impaired relaxation).  2. Right ventricular systolic function is normal. The right ventricular size is normal. There is normal pulmonary artery systolic pressure.  3. The mitral valve is normal in structure. No evidence of mitral valve regurgitation. No evidence of mitral stenosis.  4. Tricuspid valve regurgitation is mild to moderate.  5. The aortic valve is normal in structure. Aortic valve regurgitation is not visualized. Mild aortic valve stenosis.  6. The inferior vena cava is normal in size with greater than 50% respiratory variability, suggesting right  atrial pressure of 3 mmHg. FINDINGS  Left Ventricle: Left ventricular ejection fraction, by estimation, is 60 to 65%. The left ventricle has normal function. The left ventricle has no regional wall motion abnormalities. The left ventricular internal cavity size was normal in size. There is  no left ventricular hypertrophy. Left ventricular diastolic parameters are consistent with Grade I diastolic dysfunction (impaired relaxation). Normal left ventricular filling pressure. Right Ventricle: The right ventricular size is normal. No increase in right ventricular wall thickness. Right ventricular systolic function is normal. There is normal pulmonary artery systolic pressure. The tricuspid regurgitant velocity is 2.69 m/s, and  with an assumed right atrial pressure of 3 mmHg, the estimated right ventricular systolic pressure is 12.7 mmHg. Left Atrium: Left atrial size was normal in size. Right Atrium: Right atrial size was normal in size. Pericardium: There is no evidence of pericardial effusion. Mitral Valve: The mitral valve is normal in structure. Normal mobility of the mitral valve leaflets. Mild to moderate mitral annular calcification. No evidence of mitral valve regurgitation. No evidence of mitral valve stenosis. Tricuspid Valve: The tricuspid valve  is normal in structure. Tricuspid valve regurgitation is mild to moderate. No evidence of tricuspid stenosis. Aortic Valve: The aortic valve is normal in structure.. There is mild thickening and mild calcification of the aortic valve. Aortic valve regurgitation is not visualized. Mild aortic stenosis is present. There is mild thickening of the aortic valve. There is mild calcification of the aortic valve. Aortic valve mean gradient measures 12.5 mmHg. Aortic valve peak gradient measures 19.9 mmHg. Aortic valve area, by VTI measures 1.83 cm. Pulmonic Valve: The pulmonic valve was normal in structure. Pulmonic valve regurgitation is not visualized. No evidence of  pulmonic stenosis. Aorta: The aortic root is normal in size and structure. Venous: The inferior vena cava is normal in size with greater than 50% respiratory variability, suggesting right atrial pressure of 3 mmHg. IAS/Shunts: There is redundancy of the interatrial septum. No atrial level shunt detected by color flow Doppler.  LEFT VENTRICLE PLAX 2D LVIDd:         4.10 cm  Diastology LVIDs:         2.70 cm  LV e' lateral:   8.05 cm/s LV PW:         1.00 cm  LV E/e' lateral: 8.3 LV IVS:        1.30 cm  LV e' medial:    6.64 cm/s LVOT diam:     2.10 cm  LV E/e' medial:  10.0 LV SV:         76 LV SV Index:   41 LVOT Area:     3.46 cm  RIGHT VENTRICLE RV S prime:     16.10 cm/s TAPSE (M-mode): 2.0 cm LEFT ATRIUM             Index       RIGHT ATRIUM          Index LA diam:        3.20 cm 1.71 cm/m  RA Area:     8.86 cm LA Vol (A2C):   50.7 ml 27.13 ml/m RA Volume:   12.10 ml 6.47 ml/m LA Vol (A4C):   45.8 ml 24.51 ml/m LA Biplane Vol: 52.4 ml 28.04 ml/m  AORTIC VALVE AV Area (Vmax):    1.85 cm AV Area (Vmean):   1.74 cm AV Area (VTI):     1.83 cm AV Vmax:           223.00 cm/s AV Vmean:          166.500 cm/s AV VTI:            0.414 m AV Peak Grad:      19.9 mmHg AV Mean Grad:      12.5 mmHg LVOT Vmax:         119.00 cm/s LVOT Vmean:        83.800 cm/s LVOT VTI:          0.219 m LVOT/AV VTI ratio: 0.53  AORTA Ao Root diam: 3.20 cm MITRAL VALVE                TRICUSPID VALVE MV Area (PHT): 5.38 cm     TR Peak grad:   28.9 mmHg MV Decel Time: 141 msec     TR Vmax:        269.00 cm/s MV E velocity: 66.50 cm/s MV A velocity: 106.00 cm/s  SHUNTS MV E/A ratio:  0.63         Systemic VTI:  0.22 m  Systemic Diam: 2.10 cm Sanda Klein MD Electronically signed by Sanda Klein MD Signature Date/Time: 11/07/2019/10:28:36 AM    Final    VAS [place] LOWER EXTREMITY VENOUS (DVT) (ONLY [facility] & [facility])  Result Date: 11/07/2019  Lower Venous DVTStudy Indications: Swelling. Other Indications: History of  DVT. Comparison Study: 03-08-2017 Prior RT- partial chronic at [facility] Performing Technologist: Darlin Coco  Examination Guidelines: A complete evaluation includes B-mode imaging, spectral Doppler, color Doppler, and power Doppler as needed of all accessible portions of each vessel. Bilateral testing is considered an integral part of a complete examination. Limited examinations for reoccurring indications may be performed as noted. The reflux portion of the exam is performed with the patient in reverse Trendelenburg.  +---------+---------------+---------+-----------+----------+-------------------+ RIGHT    CompressibilityPhasicitySpontaneityPropertiesThrombus Aging      +---------+---------------+---------+-----------+----------+-------------------+ [facility]      Full           Yes      Yes                  Significant fibrin                                                        stranding           +---------+---------------+---------+-----------+----------+-------------------+ SFJ      Full                                                             +---------+---------------+---------+-----------+----------+-------------------+ [facility] Prox  Full                                                             +---------+---------------+---------+-----------+----------+-------------------+ FV Mid   Full                                                             +---------+---------------+---------+-----------+----------+-------------------+ FV DistalFull                                                             +---------+---------------+---------+-----------+----------+-------------------+ PFV      Full                                                             +---------+---------------+---------+-----------+----------+-------------------+ POP      Full           Yes  Yes                                       +---------+---------------+---------+-----------+----------+-------------------+ PTV      Full                                                             +---------+---------------+---------+-----------+----------+-------------------+ PERO     Full                                                             +---------+---------------+---------+-----------+----------+-------------------+   +---------+---------------+---------+-----------+----------+-------------------+ LEFT     CompressibilityPhasicitySpontaneityPropertiesThrombus Aging      +---------+---------------+---------+-----------+----------+-------------------+ [facility]      Full           Yes      Yes                  Significant fibrin                                                        stranding           +---------+---------------+---------+-----------+----------+-------------------+ SFJ      Full                                                             +---------+---------------+---------+-----------+----------+-------------------+ [facility]V Prox  Full                                         Significant fibrin                                                        stranding           +---------+---------------+---------+-----------+----------+-------------------+ FV Mid   Full                                                             +---------+---------------+---------+-----------+----------+-------------------+ FV DistalFull                                                             +---------+---------------+---------+-----------+----------+-------------------+  PFV      Full                                         Fibrin stranding    +---------+---------------+---------+-----------+----------+-------------------+ POP      None           No       No                   Age Indeterminate   +---------+---------------+---------+-----------+----------+-------------------+  [facility]V      Full                                                             +---------+---------------+---------+-----------+----------+-------------------+ PERO     Full                                                             +---------+---------------+---------+-----------+----------+-------------------+    Summary: RIGHT: - There is no evidence of deep vein thrombosis in the lower extremity.  - No cystic structure found in the popliteal fossa.  LEFT: - Findings consistent with age indeterminate deep vein thrombosis involving the left popliteal vein. - A cystic structure is found in the popliteal fossa.  *See table(s) above for measurements and observations. Electronically signed by Servando Snare MD on 11/07/2019 at 8:12:12 AM.    Final    VAS [place] UPPER EXTREMITY VENOUS DUPLEX  Result Date: 11/08/2019 UPPER VENOUS STUDY  Indications: Swelling Risk Factors: DVT. Comparison Study: No prior studies. Performing Technologist: Oliver Hum RVT  Examination Guidelines: A complete evaluation includes B-mode imaging, spectral Doppler, color Doppler, and power Doppler as needed of all accessible portions of each vessel. Bilateral testing is considered an integral part of a complete examination. Limited examinations for reoccurring indications may be performed as noted.  Right Findings: +----------+------------+---------+-----------+----------+-------+ RIGHT     CompressiblePhasicitySpontaneousPropertiesSummary +----------+------------+---------+-----------+----------+-------+ IJV           Full       Yes       Yes                      +----------+------------+---------+-----------+----------+-------+ Subclavian    Full       Yes       Yes                      +----------+------------+---------+-----------+----------+-------+ Axillary      Full       Yes       Yes                      +----------+------------+---------+-----------+----------+-------+ Brachial      Full        Yes       Yes                      +----------+------------+---------+-----------+----------+-------+ Radial        Full                                          +----------+------------+---------+-----------+----------+-------+  Ulnar         Full                                          +----------+------------+---------+-----------+----------+-------+ Cephalic      Full                                          +----------+------------+---------+-----------+----------+-------+ Basilic       Full                                          +----------+------------+---------+-----------+----------+-------+  Left Findings: +----------+------------+---------+-----------+----------+-------+ LEFT      CompressiblePhasicitySpontaneousPropertiesSummary +----------+------------+---------+-----------+----------+-------+ Subclavian    Full       Yes       Yes                      +----------+------------+---------+-----------+----------+-------+  Summary:  Right: No evidence of deep vein thrombosis in the upper extremity. No evidence of superficial vein thrombosis in the upper extremity.  Left: No evidence of thrombosis in the subclavian.  *See table(s) above for measurements and observations.  Diagnosing physician: Ruta Hinds MD Electronically signed by Ruta Hinds MD on 11/08/2019 at 12:11:11 PM.    Final         Scheduled Meds: . apixaban  10 mg Oral BID   Followed by  . [START ON 11/15/2019] apixaban  5 mg Oral BID  . Chlorhexidine Gluconate Cloth  6 each Topical Daily  . colchicine  0.6 mg Oral BID  . docusate sodium  100 mg Oral BID  . loratadine  10 mg Oral Daily  . mometasone-formoterol  2 puff Inhalation BID  . montelukast  10 mg Oral QHS  . multivitamin with minerals  1 tablet Oral Daily  . polyethylene glycol  17 g Oral BID   Continuous Infusions: . sodium chloride 75 mL/hr at 11/07/19 1235  .  ceFAZolin (ANCEF) IV 1 g (11/08/19 0033)     LOS:  2 days        Hosie Poisson, MD [facility]   To contact the attending provider between 7A-7P or the covering provider during after hours 7P-7A, please log into the web site [email or web address] and access using universal [place] password for that web site. If you do not have the password, please call the hospital operator.  11/08/2019, 4:02 PM

## 2019-11-08 NOTE — Discharge Instructions (Signed)
Information on my medicine - ELIQUIS (apixaban)  This medication education was reviewed with me or my healthcare representative as part of my discharge preparation.  Why was Eliquis prescribed for you? Eliquis was prescribed to treat blood clots that may have been found in the veins of your legs (deep vein thrombosis) or in your lungs (pulmonary embolism) and to reduce the risk of them occurring again.  What do You need to know about Eliquis ? The starting dose is 10 mg (two 5 mg tablets) taken TWICE daily for the FIRST SEVEN (7) DAYS, then on 8/21 the dose is reduced to ONE 5 mg tablet taken TWICE daily.  Eliquis may be taken with or without food.   Try to take the dose about the same time in the morning and in the evening. If you have difficulty swallowing the tablet whole please discuss with your pharmacist how to take the medication safely.  Take Eliquis exactly as prescribed and DO NOT stop taking Eliquis without talking to the doctor who prescribed the medication.  Stopping may increase your risk of developing a new blood clot.  Refill your prescription before you run out.  After discharge, you should have regular check-up appointments with your healthcare provider that is prescribing your Eliquis.    What do you do if you miss a dose? If a dose of ELIQUIS is not taken at the scheduled time, take it as soon as possible on the same day and twice-daily administration should be resumed. The dose should not be doubled to make up for a missed dose.  Important Safety Information A possible side effect of Eliquis is bleeding. You should call your healthcare provider right away if you experience any of the following: ? Bleeding from an injury or your nose that does not stop. ? Unusual colored urine (red or dark brown) or unusual colored stools (red or black). ? Unusual bruising for unknown reasons. ? A serious fall or if you hit your head (even if there is no bleeding).  Some  medicines may interact with Eliquis and might increase your risk of bleeding or clotting while on Eliquis. To help avoid this, consult your healthcare provider or pharmacist prior to using any new prescription or non-prescription medications, including herbals, vitamins, non-steroidal anti-inflammatory drugs (NSAIDs) and supplements.  This website has more information on Eliquis (apixaban): http://[email or web address].com/eliquis/home

## 2019-11-09 LAB — BASIC METABOLIC PANEL
Anion gap: 11 (ref 5–15)
BUN: 8 mg/dL (ref 8–23)
CO2: 25 mmol/L (ref 22–32)
Calcium: 8.5 mg/dL — ABNORMAL LOW (ref 8.9–10.3)
Chloride: 98 mmol/L (ref 98–111)
Creatinine, Ser: 0.44 mg/dL (ref 0.44–1.00)
GFR calc Af Amer: >60 mL/min (ref >60)
GFR calc non Af Amer: >60 mL/min (ref >60)
Glucose, Bld: 105 mg/dL — ABNORMAL HIGH (ref 70–99)
Potassium: 3.7 mmol/L (ref 3.5–5.1)
Sodium: 134 mmol/L — ABNORMAL LOW (ref 135–145)

## 2019-11-09 LAB — CBC
HCT: 31.7 % — ABNORMAL LOW (ref 36.0–46.0)
Hemoglobin: 10.3 g/dL — ABNORMAL LOW (ref 12.0–15.0)
MCH: 29.3 pg (ref 26.0–34.0)
MCHC: 32.5 g/dL (ref 30.0–36.0)
MCV: 90.1 fL (ref 80.0–100.0)
Platelets: 333 10*3/uL (ref 150–400)
RBC: 3.52 MIL/uL — ABNORMAL LOW (ref 3.87–5.11)
RDW: 14.9 % (ref 11.5–15.5)
WBC: 8.2 10*3/uL (ref 4.0–10.5)
nRBC: 0 % (ref 0.0–0.2)

## 2019-11-09 LAB — PROCALCITONIN: Procalcitonin: <0.1 ng/mL

## 2019-11-09 MED ORDER — GABAPENTIN 300 MG PO CAPS
300.0000 mg | ORAL_CAPSULE | Freq: Every day | ORAL | Status: DC
  Administered 2019-11-09 – 2019-11-15 (×7): 300 mg via ORAL
  Filled 2019-11-09 (×7): qty 1

## 2019-11-09 MED ORDER — RISAQUAD PO CAPS
1.0000 | ORAL_CAPSULE | Freq: Every day | ORAL | Status: DC
  Administered 2019-11-10 – 2019-11-16 (×7): 1 via ORAL
  Filled 2019-11-09 (×7): qty 1

## 2019-11-09 MED ORDER — KETOTIFEN FUMARATE 0.025 % OP SOLN
1.0000 [drp] | Freq: Two times a day (BID) | OPHTHALMIC | Status: DC | PRN
  Filled 2019-11-09: qty 5

## 2019-11-09 MED ORDER — GERHARDT'S BUTT CREAM
TOPICAL_CREAM | Freq: Four times a day (QID) | CUTANEOUS | Status: DC
  Administered 2019-11-12 – 2019-11-13 (×2): 1 via TOPICAL
  Filled 2019-11-09 (×2): qty 1

## 2019-11-09 MED ORDER — FLUTICASONE PROPIONATE 50 MCG/ACT NA SUSP
2.0000 | Freq: Every day | NASAL | Status: DC
  Administered 2019-11-09 – 2019-11-16 (×8): 2 via NASAL
  Filled 2019-11-09 (×2): qty 16

## 2019-11-09 MED ORDER — AMITRIPTYLINE HCL 50 MG PO TABS
100.0000 mg | ORAL_TABLET | Freq: Every evening | ORAL | Status: DC
  Administered 2019-11-09 – 2019-11-16 (×8): 100 mg via ORAL
  Filled 2019-11-09 (×9): qty 2

## 2019-11-09 MED ORDER — PANTOPRAZOLE SODIUM 40 MG PO TBEC
40.0000 mg | DELAYED_RELEASE_TABLET | Freq: Every day | ORAL | Status: DC
  Administered 2019-11-09 – 2019-11-16 (×8): 40 mg via ORAL
  Filled 2019-11-09 (×8): qty 1

## 2019-11-09 MED ORDER — OXYCODONE-ACETAMINOPHEN 5-325 MG PO TABS
1.0000 | ORAL_TABLET | Freq: Two times a day (BID) | ORAL | Status: DC | PRN
  Administered 2019-11-09 – 2019-11-10 (×2): 1 via ORAL
  Filled 2019-11-09 (×2): qty 1

## 2019-11-09 NOTE — Progress Notes (Signed)
Inpatient Rehab Admissions:  Inpatient Rehab Consult received.  I met with pt, husband Jeneen Rinks), and daughters Manuela Schwartz, [place]) at the bedside for rehabilitation assessment and to discuss goals and expectations of an inpatient rehab admission.  All acknowledged understanding of goals and expectations.  Family and pt would prefer she receive therapy at CIR vs. SNF.  Pt currently receiving PT tx; sent message to inquire about OT evaluation  Signed: Gayland Curry, [place], [place] Admissions Coordinator [phone]

## 2019-11-09 NOTE — Evaluation (Signed)
Physical Therapy Evaluation Patient Details Name: Alexis Olson MRN: [id number] DOB: 05-May-1933 Today's Date: 11/09/2019   History of Present Illness  Pt is an 86 y.o. female admitted 11/06/19 after fall at home several days prior, pt with AMS/lethargy and c/o L foot and R hand pain. Pt with acute LLE DVT. L foot imaging negative for acute injury, diffuse soft tissue swelling. 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 Workup for toxic metabolic encephalopathy secondary to polypharmacy. Other PMH includes HTN, HLD, recurrent UTI, anxiety.    Clinical Impression  Pt presents with an overall decrease in functional mobility secondary to above. PTA, pt mod indep with RW, lives with husband. Today, pt required max-totalA for limited mobility; ultimately unable to achieve standing with assist+1 and Stedy lift; will require maximove lift for OOB with staff. Pt limited by generalized weakness, pain (L ankle > R hand), decreased activity tolerance and impaired cognition. Discussed recommendations for SNF-level therapies to maximize functional mobility and independence prior to return home; pt declined, but seems confused and fatigued. Will follow acutely to address established goals.    Follow Up Recommendations SNF;Supervision/Assistance - 24 hour (if agreeable)    Equipment Recommendations   (TBD - if to return home, hoyer lift, w/c, etc.)    Recommendations for Other Services       Precautions / Restrictions Precautions Precautions: Fall;Other (comment) Required Braces or Orthoses: Splint/Cast Splint/Cast: L ankle splint - no acute injury per imaging, (+) swelling; order for R wrist splint not present, 5th DIP dislocation      Mobility  Bed Mobility Overal bed mobility: Needs Assistance Bed Mobility: Supine to Sit     Supine to sit: Max assist;HOB elevated     General bed mobility comments:  Max, repeated cues for sequencing; maxA to assist hips to EOB, manage LLE and elevate trunk with LUE support  Transfers Overall transfer level: Needs assistance Equipment used: Rolling walker (2 wheeled) Transfers: Sit to/from Stand Sit to Stand: Max assist;From elevated surface         General transfer comment: Initial attempt to stand to RW, pt fearful of falling providing very little assistance, unable to stand despite maxA; multiple trials with standing stedy frame, still unable to offload buttocks from EOB despite maxA. Will require +2 assist, likely maximove lift OOB  Ambulation/Gait             General Gait Details: Unable  Stairs            Wheelchair Mobility    Modified Rankin (Stroke Patients Only)       Balance Overall balance assessment: Needs assistance   Sitting balance-Leahy Scale: Fair Sitting balance - Comments: Prolonged sitting EOB                                     Pertinent Vitals/Pain Pain Assessment: Faces Faces Pain Scale: Hurts little more Pain Location: L ankle > R dorsal hand/MCP joints Pain Descriptors / Indicators: Discomfort;Guarding;Grimacing Pain Intervention(s): Monitored during session;Limited activity within patient's tolerance    Home Living Family/patient expects to be discharged to:: Skilled nursing facility Living Arrangements: Spouse/significant other Available Help at Discharge: Family                  Prior Function Level of Independence: Independent with assistive device(s)  Comments: Reports mod indep with RW; had TKA 08/2019. Had a fall prior to TKA and another fall leading to this admission     Hand Dominance   Dominant Hand: Right    Extremity/Trunk Assessment   Upper Extremity Assessment Upper Extremity Assessment: Generalized weakness;RUE deficits/detail RUE Deficits / Details: swollen hand/wrist, but with at least 3/5 grip strength, not formally tested RUE  Coordination: decreased fine motor    Lower Extremity Assessment Lower Extremity Assessment: Generalized weakness;LLE deficits/detail LLE Deficits / Details: L ankle in hard splint, can wiggle toes; LLE functionally <3/5 (unsure if actual weakness vs lack of effort/pain limiting) LLE: Unable to fully assess due to pain LLE Coordination: decreased gross motor    Cervical / Trunk Assessment Cervical / Trunk Assessment: Kyphotic  Communication   Communication: Other (comment) (soft spoken)  Cognition Arousal/Alertness: Awake/alert Behavior During Therapy: Flat affect Overall Cognitive Status: No family/caregiver present to determine baseline cognitive functioning Area of Impairment: Attention;Memory;Following commands;Safety/judgement;Awareness;Problem solving                   Current Attention Level: Sustained Memory: Decreased short-term memory Following Commands: Follows one step commands inconsistently;Follows one step commands with increased time Safety/Judgement: Decreased awareness of deficits;Decreased awareness of safety Awareness: Intellectual;Emergent Problem Solving: Slow processing;Decreased initiation;Difficulty sequencing;Requires verbal cues General Comments: Pt requiring frequent cues to keep eyes open and participate, more alert once sitting EOB. Easily distracted, fearful of falling, requriing frequent redirection to task      General Comments General comments (skin integrity, edema, etc.): Pt aware of fearful of falling, but unable to overcome and providing little effort with attempts to stand, stating, "I'm just too weak today... I wish my son was here to help since he's strong..." - educated on fact she has been in bed 3 days and this is unfortunately expected weakness, max encouragement to agree to OOB activity when staff offers (turned down PT yesterday). Pt retired Theatre stage manager     Assessment/Plan    PT Assessment Patient needs continued PT  services  PT Problem List Decreased strength;Decreased range of motion;Decreased activity tolerance;Decreased balance;Decreased mobility;Decreased cognition;Decreased knowledge of use of DME;Decreased safety awareness;Pain       PT Treatment Interventions DME instruction;Gait training;Stair training;Functional mobility training;Therapeutic activities;Therapeutic exercise;Balance training;Patient/family education;Wheelchair mobility training    PT Goals (Current goals can be found in the Care Plan section)  Acute Rehab PT Goals Patient Stated Goal: I want to go home PT Goal Formulation: With patient Time For Goal Achievement: 11/23/19 Potential to Achieve Goals: Fair    Frequency Min 3X/week   Barriers to discharge        Co-evaluation               AM-PAC PT "6 Clicks" Mobility  Outcome Measure Help needed turning from your back to your side while in a flat bed without using bedrails?: A Lot Help needed moving from lying on your back to sitting on the side of a flat bed without using bedrails?: A Lot Help needed moving to and from a bed to a chair (including a wheelchair)?: Total Help needed standing up from a chair using your arms (e.g., wheelchair or bedside chair)?: Total Help needed to walk in hospital room?: Total Help needed climbing 3-5 steps with a railing? : Total 6 Click Score: 8    End of Session   Activity Tolerance: Patient limited by fatigue;Patient limited by pain Patient left: in bed;with call bell/phone within reach;with bed alarm  set Nurse Communication: Mobility status;Need for lift equipment PT Visit Diagnosis: Other abnormalities of gait and mobility (R26.89);Muscle weakness (generalized) (M62.81);Pain    Time: 5397-6734 PT Time Calculation (min) (ACUTE ONLY): 42 min   Charges:   PT Evaluation $PT Eval Moderate Complexity: 1 Mod PT Treatments $Therapeutic Activity: 23-37 mins      Mabeline Caras, PT, DPT Acute Rehabilitation Services   Pager [phone] Office [place] 11/09/2019, 10:15 AM

## 2019-11-09 NOTE — Progress Notes (Signed)
Orthopedic Tech Progress Note Patient Details:  [name] February 28, 1934 [id number]  Patient ID: Alexis Olson, female   DOB: 1933/05/11, 86 y.o.   MRN: [id number] I spoke with RN about getting clarification on wrist splint. She said they will call once they know what is wanted.  Karolee Stamps 11/09/2019, 6:42 AM

## 2019-11-09 NOTE — Progress Notes (Signed)
Orthopedic Tech Progress Note Patient Details:  Alexis Olson Mar 31, 1933 [id number] Spoke with RN. MD decided patient was feeling better and brace was not needed. Patient ID: Alexis Olson, female   DOB: 1934-03-10, 86 y.o.   MRN: [id number]   [name] 11/09/2019, 3:50 PM

## 2019-11-09 NOTE — Progress Notes (Signed)
PROGRESS NOTE    Alexis Olson  ALP:[id number] DOB: 09-07-1933 DOA: 11/06/2019 PCP: Mosie Lukes, MD    Chief Complaint  Patient presents with  . Foot Pain  . Weakness    Brief Narrative:   86 year old lady with prior history of hypertension, GERD, diverticulosis, vitamin B12 deficiency, hyperlipidemia, anxiety disorder, recurrent urinary tract infection, resolved chronic low back pain presents to ED with an accidental fall at home several days prior to arrival to ED. Complaints of pain in the left foot and ankle.  On arrival patient was lethargic and confused.  X-rays of the ankle did not reveal any fracture.  Venous duplex of the lower extremity reveal an acute DVT of the left lower extremity. CT angiogram was negative for PE. Pt reports pain in the right hand, x rays show Mallet finger deformity of the index finger with possible Boutonniere deformity of the right little finger. Superior dislocation left fifth DIP.  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 She was started on pain meds, splint. Pt reports persistent pain and swelling . MRI of the hand shows Diffuse dorsal subcutaneous edema and increased signal throughout the muscles which could be due to myositis and cellulitis. No loculated fluid collections or evidence of osteomyelitis. Mild third and fourth digit flexor digitorum tenosynovitis. PT evaluation ordered recommending SNF. But family and patient would like to pursue CIR. Consult placed and pending.  Pt seen and examined at bedside. Her right hand pain has improved. Her foot still in splint.    Assessment & Plan:   Active Problems:   Toxic metabolic encephalopathy   Metabolic encephalopathy and encephalopathy sec to polypharmacy.  Much improved, she is alert and oriented and answering all questions appropriately.  No new complaints.    Accidental fall at home.  CK levels minimally elevated.  Initial CT head  and CT cervical spine ruled out fractures.  PT evaluation ordered recommending SNF. But family and patient would like to pursue CIR. Consult placed and pending. Occupation therapy ordered.    GERD  Stable.    DVT  Of the left lower extremity.  Started on IV Heparin and transitioned to oral eliquis.  Hemoglobin stable around 10   Hypokalemia and hypomagnesemia Replaced.   Right hand swelling Differential include cellulitis vs soft tissues swelling from the fall.  Started the patient empirically on IV Ancef, colchicine,  Reviewed the x rays and MRI of the hand personally.  Afebrile today with mild leukocytosis.  CRP elevatd at 17.4, ESR at 104, pro calcitonin is 0.10 Will transition to oral antibiotics in am.   Hyponatremia:  Probably from dehydration.  Much improved.    Anemia of chronic disease:  Anemia panel reviewed.  Iron deficiency,  Supplementation will be added.   Essential hypertension:  Well controlled.       DVT prophylaxis: eliquis.  Code Status: full code.  Family Communication: none at bedside. Discussed with daughter over the phone.  Disposition:   Status is: Inpatient  Remains inpatient appropriate because:Unsafe d/c plan   Dispo: The patient is from: Home              Anticipated d/c is to: CIR              Anticipated d/c date is: 1 day              Patient currently is medically stable to d/c.       Consultants:  None.   Procedures: none.  Antimicrobials: Antibiotics Given (last 72 hours)    Date/Time Action Medication Dose Rate   11/07/19 1709 New Bag/Given   ceFAZolin (ANCEF) IVPB 1 g/50 mL premix 1 g 100 mL/hr   11/08/19 0033 New Bag/Given   ceFAZolin (ANCEF) IVPB 1 g/50 mL premix 1 g 100 mL/hr   11/08/19 1631 New Bag/Given   ceFAZolin (ANCEF) IVPB 1 g/50 mL premix 1 g 100 mL/hr   11/09/19 0127 New Bag/Given   ceFAZolin (ANCEF) IVPB 1 g/50 mL premix 1 g 100 mL/hr       Subjective: Right hand swelling has  improved. No chest pain or sob.    Objective: Vitals:   11/09/19 0402 11/09/19 0822 11/09/19 0857 11/09/19 1643  BP: 119/63 140/76  (!) 146/86  Pulse: 99 95  (!) 102  Resp: _0 Temp: 98.8 F (37.1 C) 98.4 F (36.9 C)  98.5 F (36.9 C)  TempSrc: Oral Oral  Oral  SpO2: 93% 92% 95% 97%  Weight:      Height:        Intake/Output Summary (Last 24 hours) at 11/09/2019 1738 Last data filed at 11/09/2019 1454 Gross per 24 hour  Intake 440 ml  Output 3200 ml  Net -2760 ml   Filed Weights   11/06/19 1206 11/06/19 1909  Weight: 79.8 kg 79.4 kg    Examination:  General exam: Alert, comfortable, not in any kind of distress Respiratory system: Air entry fair bilateral, no wheezing or rhonchi, no tachypnea Cardiovascular system: S1-S2 heard, regular rate rhythm, no JVD. Gastrointestinal system: Abdomen is soft, nontender, nondistended, bowel sounds normal Central nervous system: Alert, oriented, grossly nonfocal Extremities: Left leg edema improving, left foot in splint, right hand edema is almost resolved tenderness has improved patient able to make a fist and extend without much pain. Skin: No rashes seen Psychiatry: Mood is appropriate    Data Reviewed: I have personally reviewed following labs and imaging studies  CBC: Recent Labs  Lab 11/06/19 1217 11/07/19 0222 11/08/19 0328 11/09/19 0449  WBC 11.5* 10.8* 10.2 8.2  NEUTROABS 9.2*  --   --   --   HGB 10.2* 9.8* 9.5* 10.3*  HCT 31.7* 30.4* 29.1* 31.7*  MCV 93.0 91.8 89.5 90.1  PLT 263 260 296 229    Basic Metabolic Panel: Recent Labs  Lab 11/06/19 1217 11/06/19 1836 11/07/19 0222 11/08/19 0328 11/09/19 0449  NA 132*  --  135 132* 134*  K 3.0*  --  3.3* 3.4* 3.7  CL 96*  --  99 96* 98  CO2 28  --  _1 GLUCOSE 190*  --  132* 124* 105*  BUN 10  --  8 6* 8  CREATININE 0.52  --  0.52 0.48 0.44  CALCIUM 8.4*  --  8.2* 7.9* 8.5*  MG  --  1.4*  --   --   --     GFR: Estimated Creatinine  Clearance: 54.5 mL/min (by C-G formula based on SCr of 0.44 mg/dL).  Liver Function Tests: Recent Labs  Lab 11/06/19 1217 11/07/19 0222  AST 21 27  ALT 20 28  ALKPHOS 47 47  BILITOT 0.5 0.5  PROT 6.1* 5.6*  ALBUMIN 2.5* 2.3*    CBG: Recent Labs  Lab 11/06/19 1223 11/06/19 2037 11/07/19 0628  GLUCAP 180* 112* 109*     Recent Results (from the past 240 hour(s))  SARS Coronavirus 2 by RT PCR (hospital order, performed in  [facility] hospital lab) Nasopharyngeal Nasopharyngeal Swab     Status: None   Collection Time: 11/06/19 12:21 PM   Specimen: Nasopharyngeal Swab  Result Value Ref Range Status   SARS Coronavirus 2 NEGATIVE NEGATIVE Final    Comment: (NOTE) SARS-CoV-2 target nucleic acids are NOT DETECTED.  The SARS-CoV-2 RNA is generally detectable in upper and lower respiratory specimens during the acute phase of infection. The lowest concentration of SARS-CoV-2 viral copies this assay can detect is 250 copies / mL. A negative result does not preclude SARS-CoV-2 infection and should not be used as the sole basis for treatment or other patient management decisions.  A negative result may occur with improper specimen collection / handling, submission of specimen other than nasopharyngeal swab, presence of viral mutation(s) within the areas targeted by this assay, and inadequate number of viral copies (<250 copies / mL). A negative result must be combined with clinical observations, patient history, and epidemiological information.  Fact Sheet for Patients:   StrictlyIdeas.no  Fact Sheet for Healthcare Providers: BankingDealers.co.za  This test is not yet approved or  cleared by the [place] FDA and has been authorized for detection and/or diagnosis of SARS-CoV-2 by FDA under an Emergency Use Authorization (EUA).  This EUA will remain in effect (meaning this test can be used) for the duration of the COVID-19  declaration under Section 564(b)(1) of the Act, 21 U.S.C. section 360bbb-3(b)(1), unless the authorization is terminated or revoked sooner.  Performed at [facility], [place] [address], [place], [place] [address]   Urine culture     Status: None   Collection Time: 11/06/19  4:38 PM   Specimen: Urine, Random  Result Value Ref Range Status   Specimen Description URINE, RANDOM  Final   Special Requests NONE  Final   Culture   Final    NO GROWTH Performed at [facility], [place] [address], [place], [place] [address]    Report Status 11/07/2019 FINAL  Final  Culture, blood (routine x 2)     Status: None (Preliminary result)   Collection Time: 11/06/19  8:37 PM   Specimen: BLOOD  Result Value Ref Range Status   Specimen Description BLOOD RIGHT ANTECUBITAL  Final   Special Requests   Final    BOTTLES DRAWN AEROBIC AND ANAEROBIC Blood Culture adequate volume   Culture   Final    NO GROWTH 3 DAYS Performed at [facility], [place] [address], [place], [place] [address]    Report Status PENDING  Incomplete  Culture, blood (routine x 2)     Status: None (Preliminary result)   Collection Time: 11/06/19  8:40 PM   Specimen: BLOOD RIGHT HAND  Result Value Ref Range Status   Specimen Description BLOOD RIGHT HAND  Final   Special Requests   Final    BOTTLES DRAWN AEROBIC AND ANAEROBIC Blood Culture adequate volume   Culture   Final    NO GROWTH 3 DAYS Performed at [facility], [place] [address], [place],  [address]    Report Status PENDING  Incomplete         Radiology Studies: MR HAND RIGHT W WO CONTRAST  Result Date: 11/08/2019 CLINICAL DATA:  Soft tissue hand infection EXAM: MRI OF THE RIGHT HAND WITHOUT AND WITH CONTRAST TECHNIQUE: Multiplanar, multisequence MR imaging of the right hand was performed before and after the administration of intravenous contrast. CONTRAST:  44m GADAVIST GADOBUTROL 1 MMOL/ML IV SOLN COMPARISON:  None.  FINDINGS:  Bones/Joint/Cartilage No areas of cortical destruction or periosteal reaction are seen. There is a a lobular heterogeneously enhancing T2 bright lesion within the fourth metacarpal measuring 2 cm with internal areas of stippled calcification and signal dropout, likely enchondroma. There is advanced first [facility] joint osteoarthritis with joint space loss and subchondral cystic changes. There is small cystic changes in the carpals. There is slight flexion deformity seen throughout the D IP joints with joint space loss. There is also osteoarthritis in the first MCP joint with marginal osteophyte formation and subchondral cystic changes. Ligaments The collateral ligaments appear to be intact. Muscles and Tendons Increased feathery signal and mild enhancement seen throughout the muscles of the hand. No loculated fluid collection. There is mildly increased signal seen within the third and fourth flexor digitorum tendons with a small amount of enhancing fluid. The extensor tendons are intact. Soft tissues There is diffuse subcutaneous edema and skin thickening seen surrounding the hand, predominantly on the dorsum. No loculated fluid collections or areas of ulceration are seen. IMPRESSION: Diffuse dorsal subcutaneous edema and increased signal throughout the muscles which could be due to myositis and cellulitis. No loculated fluid collections or evidence of osteomyelitis. Osteoarthritis as described above. Slight flexion deformities and probable heberden's nodules at the D IP joints. Mild third and fourth digit flexor digitorum tenosynovitis. 2 cm cystic lesion within the second metacarpal, likely enchondroma. Electronically Signed   By: Prudencio Pair M.D.   On: 11/08/2019 16:12        Scheduled Meds: . acidophilus  1 capsule Oral Daily  . amitriptyline  100 mg Oral QPM  . apixaban  10 mg Oral BID   Followed by  . [START ON 11/15/2019] apixaban  5 mg Oral BID  . Chlorhexidine Gluconate Cloth  6 each Topical Daily    . colchicine  0.6 mg Oral BID  . docusate sodium  100 mg Oral BID  . ferrous sulfate  325 mg Oral BID WC  . fluticasone  2 spray Each Nare Daily  . gabapentin  300 mg Oral QHS  . Gerhardt's butt cream   Topical QID  . loratadine  10 mg Oral Daily  . mometasone-formoterol  2 puff Inhalation BID  . montelukast  10 mg Oral QHS  . multivitamin with minerals  1 tablet Oral Daily  . pantoprazole  40 mg Oral Daily  . polyethylene glycol  17 g Oral BID   Continuous Infusions: .  ceFAZolin (ANCEF) IV 1 g (11/09/19 0127)     LOS: 3 days        Hosie Poisson, MD [facility]   To contact the attending provider between 7A-7P or the covering provider during after hours 7P-7A, please log into the web site [email or web address] and access using universal [place] password for that web site. If you do not have the password, please call the hospital operator.  11/09/2019, 5:38 PM

## 2019-11-10 MED ORDER — OXYCODONE-ACETAMINOPHEN 5-325 MG PO TABS
1.0000 | ORAL_TABLET | Freq: Three times a day (TID) | ORAL | Status: DC | PRN
  Administered 2019-11-10 – 2019-11-16 (×9): 1 via ORAL
  Filled 2019-11-10 (×9): qty 1

## 2019-11-10 MED ORDER — TAMSULOSIN HCL 0.4 MG PO CAPS
0.4000 mg | ORAL_CAPSULE | Freq: Every day | ORAL | Status: DC
  Administered 2019-11-10 – 2019-11-16 (×7): 0.4 mg via ORAL
  Filled 2019-11-10 (×7): qty 1

## 2019-11-10 MED ORDER — CEPHALEXIN 500 MG PO CAPS
500.0000 mg | ORAL_CAPSULE | Freq: Two times a day (BID) | ORAL | Status: AC
  Administered 2019-11-10 (×2): 500 mg via ORAL
  Filled 2019-11-10 (×2): qty 1

## 2019-11-10 NOTE — Progress Notes (Signed)
Physical Therapy Treatment Patient Details Name: Alexis Olson MRN: [id number] DOB: 05-Nov-1933 Today's Date: 11/10/2019    History of Present Illness Pt is an 86 y.o. female admitted 11/06/19 after fall at home several days prior, pt with AMS/lethargy and c/o L foot and R hand pain. Pt with acute LLE DVT. L foot imaging negative for acute injury, diffuse soft tissue swelling. 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 Workup for toxic metabolic encephalopathy secondary to polypharmacy. Other PMH includes HTN, HLD, recurrent UTI, anxiety.    PT Comments    Pt making good progress today.  She was able to progress to standing and walking 7' with RW with min-mod A of 2.  Required frequent cues for transfer technique, safe hand placement, use of RW, and sequencing.  Pt does report limited L ankle pain and feels that splint is holding her back.  Pt normally very independent.  Due to good progress , family support, and independent baseline - updated recommendation to CIR at d/c.      Follow Up Recommendations  CIR     Equipment Recommendations  Rolling walker with 5" wheels    Recommendations for Other Services       Precautions / Restrictions Precautions Precautions: Fall;Other (comment) Required Braces or Orthoses: Splint/Cast Splint/Cast: L ankle splint - no acute injury per imaging, (+) swelling; order for R wrist splint but notes state no longer needed Restrictions Weight Bearing Restrictions: No    Mobility  Bed Mobility Overal bed mobility: Needs Assistance Bed Mobility: Supine to Sit     Supine to sit: Mod assist     General bed mobility comments: Increased time; cues for sequencing and to keep scooting forward; assist to lift trunk and scoot to EOB using bed pad to facilitate  Transfers Overall transfer level: Needs assistance Equipment used: Rolling walker (2  wheeled) Transfers: Sit to/from Stand Sit to Stand: Mod assist;+2 physical assistance         General transfer comment: Cues for safe hand and foot placement (getting R leg under her body) and to lean forward.  Mod A of 2 to power up.  Once standing cues for posture, hand placement on walker handles, and walker position.  Ambulation/Gait Ambulation/Gait assistance: Min assist;+2 physical assistance;+2 safety/equipment Gait Distance (Feet): 7 Feet Assistive device: Rolling walker (2 wheeled) Gait Pattern/deviations: Shuffle;Decreased stride length;Step-to pattern Gait velocity: decreased   General Gait Details: Cues for RW proximity, posture, and sequencing.  Had chair follow for safety.  Min A of 2 for steadying; pt reports feels limited by L LE splint and reports pain is minor   Chief Strategy Officer    Modified Rankin (Stroke Patients Only)       Balance Overall balance assessment: Needs assistance Sitting-balance support: No upper extremity supported Sitting balance-Leahy Scale: Good     Standing balance support: Bilateral upper extremity supported Standing balance-Leahy Scale: Poor Standing balance comment: Required RW; mod A initially progressing to min A with cues for RW proximity and hand placement                            Cognition Arousal/Alertness: Awake/alert Behavior During Therapy: Flat affect Overall Cognitive Status: Within Functional Limits for tasks assessed  General Comments: Pt did require cues to focus on task at hand; otherwise followed all commands.  Reports she was very discouraged after not being able to get up yesterday, seemed pleased with progress.      Exercises      General Comments General comments (skin integrity, edema, etc.): Pt's max HR 115 bpm during walking; otherwise VSS.  Required max encouragement.  Educated that made good progress but still  requiring min-mod A of 2 for mobility .  Discussed recommendation for CIR.      Pertinent Vitals/Pain Pain Assessment: Faces Faces Pain Scale: Hurts little more Pain Location: Reports ankle somewhat sore; denies pain in wrist Pain Descriptors / Indicators: Sore Pain Intervention(s): Monitored during session;Premedicated before session    Home Living                      Prior Function            PT Goals (current goals can now be found in the care plan section) Acute Rehab PT Goals Patient Stated Goal: I want to go home PT Goal Formulation: With patient Time For Goal Achievement: 11/24/19 Potential to Achieve Goals: Good Progress towards PT goals: Progressing toward goals    Frequency    Min 3X/week      PT Plan Discharge plan needs to be updated    Co-evaluation PT/OT/SLP Co-Evaluation/Treatment: Yes Reason for Co-Treatment: Complexity of the patient's impairments (multi-system involvement);For patient/therapist safety PT goals addressed during session: Mobility/safety with mobility;Balance OT goals addressed during session: ADL's and self-care      AM-PAC PT "6 Clicks" Mobility   Outcome Measure  Help needed turning from your back to your side while in a flat bed without using bedrails?: A Lot Help needed moving from lying on your back to sitting on the side of a flat bed without using bedrails?: A Lot Help needed moving to and from a bed to a chair (including a wheelchair)?: A Little Help needed standing up from a chair using your arms (e.g., wheelchair or bedside chair)?: A Lot Help needed to walk in hospital room?: A Little Help needed climbing 3-5 steps with a railing? : A Lot 6 Click Score: 14    End of Session Equipment Utilized During Treatment: Gait belt Activity Tolerance: Patient tolerated treatment well Patient left: with chair alarm set;in chair;with call bell/phone within reach Nurse Communication: Mobility status (assist of 2 for  safety with RW) PT Visit Diagnosis: Other abnormalities of gait and mobility (R26.89);Muscle weakness (generalized) (M62.81);Pain     Time: 1134-1200 PT Time Calculation (min) (ACUTE ONLY): 26 min  Charges:  $Gait Training: 8-22 mins                     Abran Richard, PT Acute Rehab Services Pager [phone] [facility] [place] 11/10/2019, 12:45 PM

## 2019-11-10 NOTE — Progress Notes (Signed)
PROGRESS NOTE    Alexis Olson  VQQ:[id number] DOB: [birth year] DOA: 11/06/2019 PCP: Mosie Lukes, MD    Chief Complaint  Patient presents with  . Foot Pain  . Weakness    Brief Narrative:   86 year old lady with prior history of hypertension, GERD, diverticulosis, vitamin B12 deficiency, hyperlipidemia, anxiety disorder, recurrent urinary tract infection, resolved chronic low back pain presents to ED with an accidental fall at home several days prior to arrival to ED. Complaints of pain in the left foot and ankle.  On arrival patient was lethargic and confused.  X-rays of the ankle did not reveal any fracture.  Venous duplex of the lower extremity reveal an acute DVT of the left lower extremity. CT angiogram was negative for PE. Pt reports pain in the right hand, x rays show Mallet finger deformity of the index finger with possible Boutonniere deformity of the right little finger. Superior dislocation left fifth DIP.  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 She was started on pain meds, splint. Pt reports persistent pain and swelling . MRI of the hand shows Diffuse dorsal subcutaneous edema and increased signal throughout the muscles which could be due to myositis and cellulitis. No loculated fluid collections or evidence of osteomyelitis. Mild third and fourth digit flexor digitorum tenosynovitis. PT evaluation ordered recommending SNF. But family and patient would like to pursue CIR. Consult placed and pending.  Her right hand pain has improved. Her foot still in splint. Pt seen and examined, told her we need to remove foley catheter which was placed on admission due to urinary retention. Patient adamantly refused and after discussing with family , agreed to take medication and try voiding trial in am.    Assessment & Plan:   Active Problems:   Toxic metabolic encephalopathy   Metabolic encephalopathy and encephalopathy sec to  polypharmacy.  Much improved, she is alert and oriented and answering all questions appropriately.  No new complaints. No changes in meds.    Accidental fall at home.  CK levels minimally elevated.  Initial CT head and CT cervical spine ruled out fractures.  PT evaluation ordered recommending SNF. But family and patient would like to pursue CIR. Consult placed and pending. Occupation therapy ordered.  Currently waiting for bed at CIR.    GERD  Stable.    DVT  Of the left lower extremity.  Started on IV Heparin and transitioned to oral eliquis.  Hemoglobin stable around 10   Hypokalemia and hypomagnesemia Replaced.   Right hand swelling Differential include cellulitis vs soft tissues swelling from the fall.  Started the patient empirically on IV Ancef, colchicine,  Reviewed the x rays and MRI of the hand personally.  Afebrile today with mild leukocytosis.  CRP elevatd at 17.4, ESR at 104, pro calcitonin is 0.10 Transition to oral keflex for another 2 days to complete the course.   Hyponatremia:  Probably from dehydration.  Much improved.    Anemia of chronic disease:  Anemia panel reviewed.  Iron deficiency,  Supplementation will be added.   Essential hypertension:  Well controlled.    Urinary retention :  Foley catheter placed for urinary retention on admission.  Voiding trial in am. Pt refused to take the cathter out today.    DVT prophylaxis: eliquis.  Code Status: full code.  Family Communication: none at bedside. Discussed with daughter over the phone.  Disposition:   Status is: Inpatient  Remains inpatient appropriate because:Unsafe  d/c plan   Dispo: The patient is from: Home              Anticipated d/c is to: CIR              Anticipated d/c date is: 1 day              Patient currently is medically stable to d/c.       Consultants:   None.   Procedures: none.  Antimicrobials: Antibiotics Given (last 72 hours)    Date/Time Action  Medication Dose Rate   11/07/19 1709 New Bag/Given   ceFAZolin (ANCEF) IVPB 1 g/50 mL premix 1 g 100 mL/hr   11/08/19 0033 New Bag/Given   ceFAZolin (ANCEF) IVPB 1 g/50 mL premix 1 g 100 mL/hr   11/08/19 1631 New Bag/Given   ceFAZolin (ANCEF) IVPB 1 g/50 mL premix 1 g 100 mL/hr   11/09/19 0127 New Bag/Given   ceFAZolin (ANCEF) IVPB 1 g/50 mL premix 1 g 100 mL/hr   11/09/19 1816 New Bag/Given   ceFAZolin (ANCEF) IVPB 1 g/50 mL premix 1 g 100 mL/hr   11/10/19 0115 New Bag/Given   ceFAZolin (ANCEF) IVPB 1 g/50 mL premix 1 g 100 mL/hr   11/10/19 0941 Given   cephALEXin (KEFLEX) capsule 500 mg 500 mg        Subjective: Pt denies any new complaints, appears in good spirits.    Objective: Vitals:   11/10/19 0407 11/10/19 0758 11/10/19 0848 11/10/19 1316  BP: 115/72 131/63  128/73  Pulse: 93 90  96  Resp: 11 15  18   Temp: 97.7 F (36.5 C) 98.4 F (36.9 C)  98.2 F (36.8 C)  TempSrc: Axillary Oral  Oral  SpO2: 92% 94% 95% 100%  Weight:      Height:        Intake/Output Summary (Last 24 hours) at 11/10/2019 1605 Last data filed at 11/10/2019 1300 Gross per 24 hour  Intake 290 ml  Output 3050 ml  Net -2760 ml   Filed Weights   11/06/19 1206 11/06/19 1909  Weight: 79.8 kg 79.4 kg    Examination:  General exam: alert, comfortable, in good spirits and motivated.  Respiratory system: air entry fair , no wheezing heard. No rhonchi.  Cardiovascular system: S1S2, RRR, no JVD, pedal edema improving.  Gastrointestinal system: Abdomen is soft, non tender non distended, bowel sounds wnl.  Central nervous system: Alert, oriented, grossly non focal.  Extremities: right leg edema resolved. Right hand swelling resolved.  Skin: No rashes seen Psychiatry: Mood is appropriate    Data Reviewed: I have personally reviewed following labs and imaging studies  CBC: Recent Labs  Lab 11/06/19 1217 11/07/19 0222 11/08/19 0328 11/09/19 0449  WBC 11.5* 10.8* 10.2 8.2  NEUTROABS 9.2*   --   --   --   HGB 10.2* 9.8* 9.5* 10.3*  HCT 31.7* 30.4* 29.1* 31.7*  MCV 93.0 91.8 89.5 90.1  PLT 263 260 296 294    Basic Metabolic Panel: Recent Labs  Lab 11/06/19 1217 11/06/19 1836 11/07/19 0222 11/08/19 0328 11/09/19 0449  NA 132*  --  135 132* 134*  K 3.0*  --  3.3* 3.4* 3.7  CL 96*  --  99 96* 98  CO2 28  --  26 26 25   GLUCOSE 190*  --  132* 124* 105*  BUN 10  --  8 6* 8  CREATININE 0.52  --  0.52 0.48 0.44  CALCIUM 8.4*  --  8.2* 7.9* 8.5*  MG  --  1.4*  --   --   --     GFR: Estimated Creatinine Clearance: 54.5 mL/min (by C-G formula based on SCr of 0.44 mg/dL).  Liver Function Tests: Recent Labs  Lab 11/06/19 1217 11/07/19 0222  AST 21 27  ALT 20 28  ALKPHOS 47 47  BILITOT 0.5 0.5  PROT 6.1* 5.6*  ALBUMIN 2.5* 2.3*    CBG: Recent Labs  Lab 11/06/19 1223 11/06/19 2037 11/07/19 0628  GLUCAP 180* 112* 109*     Recent Results (from the past 240 hour(s))  SARS Coronavirus 2 by RT PCR (hospital order, performed in [facility] hospital lab) Nasopharyngeal Nasopharyngeal Swab     Status: None   Collection Time: 11/06/19 12:21 PM   Specimen: Nasopharyngeal Swab  Result Value Ref Range Status   SARS Coronavirus 2 NEGATIVE NEGATIVE Final    Comment: (NOTE) SARS-CoV-2 target nucleic acids are NOT DETECTED.  The SARS-CoV-2 RNA is generally detectable in upper and lower respiratory specimens during the acute phase of infection. The lowest concentration of SARS-CoV-2 viral copies this assay can detect is 250 copies / mL. A negative result does not preclude SARS-CoV-2 infection and should not be used as the sole basis for treatment or other patient management decisions.  A negative result may occur with improper specimen collection / handling, submission of specimen other than nasopharyngeal swab, presence of viral mutation(s) within the areas targeted by this assay, and inadequate number of viral copies (<250 copies / mL). A negative result must be  combined with clinical observations, patient history, and epidemiological information.  Fact Sheet for Patients:   StrictlyIdeas.no  Fact Sheet for Healthcare Providers: BankingDealers.co.za  This test is not yet approved or  cleared by the [place] FDA and has been authorized for detection and/or diagnosis of SARS-CoV-2 by FDA under an Emergency Use Authorization (EUA).  This EUA will remain in effect (meaning this test can be used) for the duration of the COVID-19 declaration under Section 564(b)(1) of the Act, 21 U.S.C. section 360bbb-3(b)(1), unless the authorization is terminated or revoked sooner.  Performed at [facility], [place] [address], [place], [place] [address]   Urine culture     Status: None   Collection Time: 11/06/19  4:38 PM   Specimen: Urine, Random  Result Value Ref Range Status   Specimen Description URINE, RANDOM  Final   Special Requests NONE  Final   Culture   Final    NO GROWTH Performed at [facility], [place] [address], [place], [place] [address]    Report Status 11/07/2019 FINAL  Final  Culture, blood (routine x 2)     Status: None (Preliminary result)   Collection Time: 11/06/19  8:37 PM   Specimen: BLOOD  Result Value Ref Range Status   Specimen Description BLOOD RIGHT ANTECUBITAL  Final   Special Requests   Final    BOTTLES DRAWN AEROBIC AND ANAEROBIC Blood Culture adequate volume   Culture   Final    NO GROWTH 4 DAYS Performed at [facility], [place] [address], [place],  [address]    Report Status PENDING  Incomplete  Culture, blood (routine x 2)     Status: None (Preliminary result)   Collection Time: 11/06/19  8:40 PM   Specimen: BLOOD RIGHT HAND  Result Value Ref Range Status   Specimen Description BLOOD RIGHT HAND  Final   Special Requests   Final  BOTTLES DRAWN AEROBIC AND ANAEROBIC Blood Culture adequate volume   Culture   Final    NO GROWTH 4  DAYS Performed at [facility], [place] [address] [place], [place] [address]    Report Status PENDING  Incomplete         Radiology Studies: No results found.      Scheduled Meds: . acidophilus  1 capsule Oral Daily  . amitriptyline  100 mg Oral QPM  . apixaban  10 mg Oral BID   Followed by  . [START ON 11/15/2019] apixaban  5 mg Oral BID  . cephALEXin  500 mg Oral Q12H  . Chlorhexidine Gluconate Cloth  6 each Topical Daily  . docusate sodium  100 mg Oral BID  . ferrous sulfate  325 mg Oral BID WC  . fluticasone  2 spray Each Nare Daily  . gabapentin  300 mg Oral QHS  . Gerhardt's butt cream   Topical QID  . loratadine  10 mg Oral Daily  . mometasone-formoterol  2 puff Inhalation BID  . montelukast  10 mg Oral QHS  . multivitamin with minerals  1 tablet Oral Daily  . pantoprazole  40 mg Oral Daily  . polyethylene glycol  17 g Oral BID  . tamsulosin  0.4 mg Oral QPC supper   Continuous Infusions:    LOS: 4 days        Hosie Poisson, MD [facility]   To contact the attending provider between 7A-7P or the covering provider during after hours 7P-7A, please log into the web site [email or web address] and access using universal [place] password for that web site. If you do not have the password, please call the hospital operator.  11/10/2019, 4:05 PM

## 2019-11-10 NOTE — Evaluation (Signed)
Occupational Therapy Evaluation Patient Details Name: Alexis Olson MRN: [id number] DOB: 03/29/1933 Today's Date: 11/10/2019    History of Present Illness Pt is an 86 y.o. female admitted 11/06/19 after fall at home several days prior, pt with AMS/lethargy and c/o L foot and R hand pain. Pt with acute LLE DVT. L foot imaging negative for acute injury, diffuse soft tissue swelling. 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 Workup for toxic metabolic encephalopathy secondary to polypharmacy. Other PMH includes HTN, HLD, recurrent UTI, anxiety.   Clinical Impression   This 86 y/o female presents with the above. PTA pt reports being mod independent with ADL and functional mobility. Today pt requiring two person assist for safe completion of functional transfers using RW, tolerating OOB and able to take few steps in room prior to transfer to recliner. Pt requiring up to [place] for LB and toileting ADL. Pt is motivated to return to her PLOF, appears encouraged having been able to get up and move more today compared to initial PT eval. Max HR noted 115 bpm with activity. Pt to benefit from continued acute OT services and feel she will benefit from CIR level therapies at time of discharge to maximize her safety and independence with ADL and mobility.     Follow Up Recommendations  CIR    Equipment Recommendations  Other (comment) (TBD in next venue)    Recommendations for Other Services Rehab consult     Precautions / Restrictions Precautions Precautions: Fall;Other (comment) Required Braces or Orthoses: Splint/Cast Splint/Cast: L ankle splint - no acute injury per imaging, (+) swelling; order for R wrist splint but notes state no longer needed Restrictions Weight Bearing Restrictions: No      Mobility Bed Mobility Overal bed mobility: Needs Assistance Bed Mobility: Supine to Sit     Supine to  sit: Mod assist     General bed mobility comments: Increased time; cues for sequencing and to keep scooting forward; assist to lift trunk and scoot to EOB using bed pad to facilitate  Transfers Overall transfer level: Needs assistance Equipment used: Rolling walker (2 wheeled) Transfers: Sit to/from Stand Sit to Stand: Mod assist;+2 physical assistance         General transfer comment: Cues for safe hand and foot placement (getting R leg under her body) and to lean forward.  Mod A of 2 to power up.  Once standing cues for posture, hand placement on walker handles, and walker position.    Balance Overall balance assessment: Needs assistance Sitting-balance support: No upper extremity supported Sitting balance-Leahy Scale: Good     Standing balance support: Bilateral upper extremity supported Standing balance-Leahy Scale: Poor Standing balance comment: Required RW; mod A initially progressing to min A with cues for RW proximity and hand placement                           ADL either performed or assessed with clinical judgement   ADL Overall ADL's : Needs assistance/impaired Eating/Feeding: Set up;Sitting   Grooming: Supervision/safety;Sitting   Upper Body Bathing: Minimal assistance;Sitting   Lower Body Bathing: Maximal assistance;+2 for physical assistance;Sit to/from stand   Upper Body Dressing : Minimal assistance;Sitting   Lower Body Dressing: Maximal assistance;Sit to/from stand;+2 for physical assistance Lower Body Dressing Details (indicate cue type and reason): assist to don L sock today Toilet Transfer: Minimal assistance;+2 for physical assistance;Stand-pivot;RW  Toilet Transfer Details (indicate cue type and reason): simulated via transfer to recliner  Toileting- Clothing Manipulation and Hygiene: Total assistance;+2 for physical assistance;Sit to/from stand;Bed level Toileting - Clothing Manipulation Details (indicate cue type and reason): pt  insistent on using bed pain initially upon arrival to room offered to help get up to [facility] but pt reports too urgent      Functional mobility during ADLs: Moderate assistance;+2 for physical assistance;Rolling walker                           Pertinent Vitals/Pain Pain Assessment: Faces Faces Pain Scale: Hurts little more Pain Location: Reports ankle somewhat sore; denies pain in wrist Pain Descriptors / Indicators: Sore Pain Intervention(s): Monitored during session;Repositioned;Premedicated before session     Hand Dominance Right   Extremity/Trunk Assessment Upper Extremity Assessment Upper Extremity Assessment: Generalized weakness   Lower Extremity Assessment Lower Extremity Assessment: Defer to PT evaluation       Communication Communication Communication: No difficulties   Cognition Arousal/Alertness: Awake/alert Behavior During Therapy: Flat affect Overall Cognitive Status: Within Functional Limits for tasks assessed                                 General Comments: Pt did require cues to focus on task at hand; otherwise followed all commands.  Reports she was very discouraged after not being able to get up yesterday, seemed pleased with progress. appears to be an accurate historian, remembered the name of MD on CIR from her husband's stay there last fall    General Comments  Pt's max HR 115 bpm during walking; otherwise VSS.  Required max encouragement.  Educated that made good progress but still requiring min-mod A of 2 for mobility .  Discussed recommendation for CIR.    Exercises     Shoulder Instructions      Home Living Family/patient expects to be discharged to:: Private residence Living Arrangements: Spouse/significant other Available Help at Discharge: Family Type of Home: House Home Access: Stairs to enter Technical brewer of Steps: 5 Entrance Stairs-Rails: Left Home Layout: One level     Bathroom Shower/Tub: Emergency planning/management officer: Handicapped height Bathroom Accessibility: Yes How Accessible: Accessible via walker [place];Bedside commode;Walker - 2 wheels   Additional Comments: equipment obtained from previous admission      Prior Functioning/Environment Level of Independence: Independent with assistive device(s)        Comments: Reports mod indep with RW; had TKA 08/2019. Had a fall prior to TKA and another fall leading to this admission        OT Problem List: Decreased strength;Decreased range of motion;Decreased activity tolerance;Impaired balance (sitting and/or standing);Decreased knowledge of use of DME or AE;Decreased knowledge of precautions;Pain      OT Treatment/Interventions: Self-care/ADL training;Therapeutic exercise;Energy conservation;DME and/or AE instruction;Therapeutic activities;Patient/family education;Balance training    OT Goals(Current goals can be found in the care plan section) Acute Rehab OT Goals Patient Stated Goal: I want to go home OT Goal Formulation: With patient Time For Goal Achievement: 11/24/19 Potential to Achieve Goals: Good  OT Frequency: Min 2X/week   Barriers to D/C:            Co-evaluation PT/OT/SLP Co-Evaluation/Treatment: Yes Reason for Co-Treatment: Complexity of the patient's impairments (multi-system involvement);For patient/therapist safety;To address functional/ADL transfers PT goals addressed during session: Mobility/safety with mobility;Balance OT  goals addressed during session: ADL's and self-care      AM-PAC OT "6 Clicks" Daily Activity     Outcome Measure Help from another person eating meals?: A Little Help from another person taking care of personal grooming?: A Little Help from another person toileting, which includes using toliet, bedpan, or urinal?: Total Help from another person bathing (including washing, rinsing, drying)?: A Lot Help from another person to put on and taking  off regular upper body clothing?: A Lot Help from another person to put on and taking off regular lower body clothing?: A Lot 6 Click Score: 13   End of Session Equipment Utilized During Treatment: Gait belt;Rolling walker Nurse Communication: Mobility status  Activity Tolerance: Patient tolerated treatment well Patient left: in chair;with call bell/phone within reach;with chair alarm set  OT Visit Diagnosis: Other abnormalities of gait and mobility (R26.89);Muscle weakness (generalized) (M62.81);Pain Pain - Right/Left: Left Pain - part of body: Leg;Ankle and joints of foot                Time: 1137-1205 OT Time Calculation (min): 28 min Charges:  OT General Charges $OT Visit: 1 Visit OT Evaluation $OT Eval Moderate Complexity: [place], OT Acute Rehabilitation Services Pager [phone] Office [phone]   Raymondo Band 11/10/2019, 2:30 PM

## 2019-11-11 LAB — CULTURE, BLOOD (ROUTINE X 2)
Culture: NO GROWTH
Culture: NO GROWTH
Special Requests: ADEQUATE
Special Requests: ADEQUATE

## 2019-11-11 LAB — BASIC METABOLIC PANEL
Anion gap: 12 (ref 5–15)
BUN: 12 mg/dL (ref 8–23)
CO2: 26 mmol/L (ref 22–32)
Calcium: 8.7 mg/dL — ABNORMAL LOW (ref 8.9–10.3)
Chloride: 100 mmol/L (ref 98–111)
Creatinine, Ser: 0.54 mg/dL (ref 0.44–1.00)
GFR calc Af Amer: >60 mL/min (ref >60)
GFR calc non Af Amer: >60 mL/min (ref >60)
Glucose, Bld: 109 mg/dL — ABNORMAL HIGH (ref 70–99)
Potassium: 2.8 mmol/L — ABNORMAL LOW (ref 3.5–5.1)
Sodium: 138 mmol/L (ref 135–145)

## 2019-11-11 LAB — MAGNESIUM: Magnesium: 1.7 mg/dL (ref 1.7–2.4)

## 2019-11-11 MED ORDER — POTASSIUM CHLORIDE CRYS ER 20 MEQ PO TBCR
40.0000 meq | EXTENDED_RELEASE_TABLET | ORAL | Status: AC
  Administered 2019-11-11 (×3): 40 meq via ORAL
  Filled 2019-11-11 (×3): qty 2

## 2019-11-11 MED ORDER — MAGNESIUM SULFATE 2 GM/50ML IV SOLN
2.0000 g | Freq: Once | INTRAVENOUS | Status: AC
  Administered 2019-11-11: 2 g via INTRAVENOUS
  Filled 2019-11-11: qty 50

## 2019-11-11 NOTE — Progress Notes (Signed)
PROGRESS NOTE    Alexis Olson  MWN:[id number] DOB: 1933-04-18 DOA: 11/06/2019 PCP: Mosie Lukes, MD    Chief Complaint  Patient presents with  . Foot Pain  . Weakness    Brief Narrative:   86 year old lady with prior history of hypertension, GERD, diverticulosis, vitamin B12 deficiency, hyperlipidemia, anxiety disorder, recurrent urinary tract infection, resolved chronic low back pain presents to ED with an accidental fall at home several days prior to arrival to ED. Complaints of pain in the left foot and ankle.  On arrival patient was lethargic and confused.  X-rays of the ankle did not reveal any fracture.  Venous duplex of the lower extremity reveal an acute DVT of the left lower extremity. CT angiogram was negative for PE. Pt reports pain in the right hand, x rays show Mallet finger deformity of the index finger with possible Boutonniere deformity of the right little finger. Superior dislocation left fifth DIP. 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 She was started on pain meds, splint. Pt reports persistent pain and swelling . MRI of the hand shows Diffuse dorsal subcutaneous edema and increased signal throughout the muscles which could be due to myositis and cellulitis. No loculated fluid collections or evidence of osteomyelitis. Mild third and fourth digit flexor digitorum tenosynovitis. PT evaluation ordered recommending SNF. But family and patient would like to pursue CIR. Consult placed and pending insurance authorization.  Her right hand pain has improved. Her left foot still in splint. Pt seen and examined today. She is still refusing to get the foley catheter out . Reports she is scared and will attempt at inpatient rehab when she is more active and moving around.  Discussed with the patient's daughter about complications of having a foley catheter.    Assessment & Plan:   Active Problems:   Toxic metabolic  encephalopathy   Metabolic encephalopathy and encephalopathy sec to polypharmacy.  Much improved, she is alert and oriented and answering all questions appropriately.  No new complaints.    Accidental fall at home.  CK levels minimally elevated.  Initial CT head and CT cervical spine ruled out fractures.  PT evaluation ordered recommending SNF. But family and patient would like to pursue CIR. Currently waiting for insurance auth for CIR.   GERD  Stable.    DVT  Of the left lower extremity.  Started on IV Heparin and transitioned to oral eliquis.  Hemoglobin stable around 10   Hypokalemia and hypomagnesemia Replaced. Repeat in am.    Right hand swelling Differential include cellulitis vs soft tissues swelling from the fall.  Started the patient empirically on IV Ancef, colchicine,  Reviewed the x rays and MRI of the hand personally.  Afebrile today with mild leukocytosis.  CRP elevatd at 17.4, ESR at 104, pro calcitonin is 0.10 Completed the course of antibiotics.   Hyponatremia:  Probably from dehydration.  Resolved.    Anemia of chronic disease:  Anemia panel reviewed.  Iron deficiency,  Supplementation will be added.  Hemoglobin stable around 10.   Essential hypertension:  Well controlled.   Urinary retention :  Foley catheter placed for urinary retention on admission.  Voiding trial in am. Pt refused to take the cathter out today.    DVT prophylaxis: eliquis.  Code Status: full code.  Family Communication: none at bedside. Discussed with daughter over the phone on 11/10/2019.  Disposition:   Status is: Inpatient  Remains inpatient appropriate because:Unsafe d/c  plan   Dispo: The patient is from: Home              Anticipated d/c is to: CIR              Anticipated d/c date is: 1 day              Patient currently is medically stable to d/c.       Consultants:   None.   Procedures: none.  Antimicrobials: Antibiotics Given (last 72  hours)    Date/Time Action Medication Dose Rate   11/08/19 1631 New Bag/Given   ceFAZolin (ANCEF) IVPB 1 g/50 mL premix 1 g 100 mL/hr   11/09/19 0127 New Bag/Given   ceFAZolin (ANCEF) IVPB 1 g/50 mL premix 1 g 100 mL/hr   11/09/19 1816 New Bag/Given   ceFAZolin (ANCEF) IVPB 1 g/50 mL premix 1 g 100 mL/hr   11/10/19 0115 New Bag/Given   ceFAZolin (ANCEF) IVPB 1 g/50 mL premix 1 g 100 mL/hr   11/10/19 0941 Given   cephALEXin (KEFLEX) capsule 500 mg 500 mg    11/10/19 2242 Given   cephALEXin (KEFLEX) capsule 500 mg 500 mg        Subjective: No new complaints. She reports having a BM this am.  Adamantly refusing to voiding trial today despite telling her that she might have a urinary tract infection.    Objective: Vitals:   11/10/19 2326 11/11/19 0412 11/11/19 0824 11/11/19 1152  BP: 113/70 137/71 115/63 123/65  Pulse: 83 78 98 96  Resp: _0 Temp: 98.3 F (36.8 C) 98.2 F (36.8 C) 98.5 F (36.9 C) 98.6 F (37 C)  TempSrc: Oral Oral Oral Oral  SpO2: 94% 96% 97% 95%  Weight:      Height:        Intake/Output Summary (Last 24 hours) at 11/11/2019 1446 Last data filed at 11/11/2019 1153 Gross per 24 hour  Intake --  Output 1350 ml  Net -1350 ml   Filed Weights   11/06/19 1206 11/06/19 1909  Weight: 79.8 kg 79.4 kg    Examination:  General exam: Alert and comfortable, in good spirits  Respiratory system: Clear to auscultation bilaterally, no wheezing or rhonchi. Cardiovascular system: S1-S2 heard, regular rate rhythm, no JVD, left foot edema is improving Gastrointestinal system: Abdomen is soft, nontender, nondistended, bowel sounds normal.  Central nervous system: Alert and oriented, grossly nonfocal Extremities: Right hand swelling resolved, left foot swelling improving and in splint. Skin: No rashes seen Psychiatry: Mood is appropriate    Data Reviewed: I have personally reviewed following labs and imaging studies  CBC: Recent Labs  Lab  11/06/19 1217 11/07/19 0222 11/08/19 0328 11/09/19 0449  WBC 11.5* 10.8* 10.2 8.2  NEUTROABS 9.2*  --   --   --   HGB 10.2* 9.8* 9.5* 10.3*  HCT 31.7* 30.4* 29.1* 31.7*  MCV 93.0 91.8 89.5 90.1  PLT 263 260 296 578    Basic Metabolic Panel: Recent Labs  Lab 11/06/19 1217 11/06/19 1836 11/07/19 0222 11/08/19 0328 11/09/19 0449 11/11/19 0947  NA 132*  --  135 132* 134* 138  K 3.0*  --  3.3* 3.4* 3.7 2.8*  CL 96*  --  99 96* 98 100  CO2 28  --  _1 GLUCOSE 190*  --  132* 124* 105* 109*  BUN 10  --  8 6* 8 12  CREATININE 0.52  --  0.52 0.48 0.44 0.54  CALCIUM 8.4*  --  8.2* 7.9* 8.5* 8.7*  MG  --  1.4*  --   --   --  1.7    GFR: Estimated Creatinine Clearance: 54.5 mL/min (by C-G formula based on SCr of 0.54 mg/dL).  Liver Function Tests: Recent Labs  Lab 11/06/19 1217 11/07/19 0222  AST 21 27  ALT 20 28  ALKPHOS 47 47  BILITOT 0.5 0.5  PROT 6.1* 5.6*  ALBUMIN 2.5* 2.3*    CBG: Recent Labs  Lab 11/06/19 1223 11/06/19 2037 11/07/19 0628  GLUCAP 180* 112* 109*     Recent Results (from the past 240 hour(s))  SARS Coronavirus 2 by RT PCR (hospital order, performed in [facility] hospital lab) Nasopharyngeal Nasopharyngeal Swab     Status: None   Collection Time: 11/06/19 12:21 PM   Specimen: Nasopharyngeal Swab  Result Value Ref Range Status   SARS Coronavirus 2 NEGATIVE NEGATIVE Final    Comment: (NOTE) SARS-CoV-2 target nucleic acids are NOT DETECTED.  The SARS-CoV-2 RNA is generally detectable in upper and lower respiratory specimens during the acute phase of infection. The lowest concentration of SARS-CoV-2 viral copies this assay can detect is 250 copies / mL. A negative result does not preclude SARS-CoV-2 infection and should not be used as the sole basis for treatment or other patient management decisions.  A negative result may occur with improper specimen collection / handling, submission of specimen other than nasopharyngeal swab,  presence of viral mutation(s) within the areas targeted by this assay, and inadequate number of viral copies (<250 copies / mL). A negative result must be combined with clinical observations, patient history, and epidemiological information.  Fact Sheet for Patients:   StrictlyIdeas.no  Fact Sheet for Healthcare Providers: BankingDealers.co.za  This test is not yet approved or  cleared by the [place] FDA and has been authorized for detection and/or diagnosis of SARS-CoV-2 by FDA under an Emergency Use Authorization (EUA).  This EUA will remain in effect (meaning this test can be used) for the duration of the COVID-19 declaration under Section 564(b)(1) of the Act, 21 U.S.C. section 360bbb-3(b)(1), unless the authorization is terminated or revoked sooner.  Performed at [facility], [place] [address] [place], [place] [address]   Urine culture     Status: None   Collection Time: 11/06/19  4:38 PM   Specimen: Urine, Random  Result Value Ref Range Status   Specimen Description URINE, RANDOM  Final   Special Requests NONE  Final   Culture   Final    NO GROWTH Performed at [facility], [place] [address] [place], [place] [address]    Report Status 11/07/2019 FINAL  Final  Culture, blood (routine x 2)     Status: None   Collection Time: 11/06/19  8:37 PM   Specimen: BLOOD  Result Value Ref Range Status   Specimen Description BLOOD RIGHT ANTECUBITAL  Final   Special Requests   Final    BOTTLES DRAWN AEROBIC AND ANAEROBIC Blood Culture adequate volume   Culture   Final    NO GROWTH 5 DAYS Performed at [facility], [place] [address] [place], [place] [address]    Report Status 11/11/2019 FINAL  Final  Culture, blood (routine x 2)     Status: None   Collection Time: 11/06/19  8:40 PM   Specimen: BLOOD RIGHT HAND  Result Value Ref Range Status   Specimen Description BLOOD RIGHT HAND  Final   Special Requests  Final     BOTTLES DRAWN AEROBIC AND ANAEROBIC Blood Culture adequate volume   Culture   Final    NO GROWTH 5 DAYS Performed at [facility], [place] [address] [place], [place] [address]    Report Status 11/11/2019 FINAL  Final         Radiology Studies: No results found.      Scheduled Meds: . acidophilus  1 capsule Oral Daily  . amitriptyline  100 mg Oral QPM  . apixaban  10 mg Oral BID   Followed by  . [START ON 11/15/2019] apixaban  5 mg Oral BID  . Chlorhexidine Gluconate Cloth  6 each Topical Daily  . docusate sodium  100 mg Oral BID  . ferrous sulfate  325 mg Oral BID WC  . fluticasone  2 spray Each Nare Daily  . gabapentin  300 mg Oral QHS  . Gerhardt's butt cream   Topical QID  . loratadine  10 mg Oral Daily  . mometasone-formoterol  2 puff Inhalation BID  . montelukast  10 mg Oral QHS  . multivitamin with minerals  1 tablet Oral Daily  . pantoprazole  40 mg Oral Daily  . polyethylene glycol  17 g Oral BID  . potassium chloride  40 mEq Oral Q4H  . tamsulosin  0.4 mg Oral QPC supper   Continuous Infusions: . magnesium sulfate bolus IVPB       LOS: 5 days        Hosie Poisson, MD [facility]   To contact the attending provider between 7A-7P or the covering provider during after hours 7P-7A, please log into the web site [email or web address] and access using universal [place] password for that web site. If you do not have the password, please call the hospital operator.  11/11/2019, 2:46 PM

## 2019-11-11 NOTE — Progress Notes (Signed)
Inpatient Rehab Admissions Coordinator:   Met with pt at bedside to introduce myself as I will be following for the next few days.  I sent information to pt's insurance to open case for prior authorization today and expect to hear a determination within 24 hours.  I also spoke to the patients daughter, Darrick Grinder, over the phone to provide an update.  Shann Medal, PT, DPT Admissions Coordinator [phone] 11/11/19  1:09 PM

## 2019-11-11 NOTE — Progress Notes (Signed)
Mobility Specialist - Progress Note   11/11/19 1430  Mobility  Activity Transferred:  Bed to chair  Level of Assistance Moderate assist, patient does 50-74%  Assistive Device Front wheel walker  Mobility Response Tolerated well  Mobility performed by Mobility specialist  $Mobility charge 1 Mobility   Assisted NT w/ transfer to chair from bed. Pt required a mod assist in order to stand, and contact guard w/ verbal cues while pivoting to chair. HR ranged from 97-110, SpO2 stayed ~97%.   Pricilla Handler Mobility Specialist Mobility Specialist Phone: [phone]

## 2019-11-12 LAB — BASIC METABOLIC PANEL
Anion gap: 9 (ref 5–15)
BUN: 11 mg/dL (ref 8–23)
CO2: 26 mmol/L (ref 22–32)
Calcium: 8.6 mg/dL — ABNORMAL LOW (ref 8.9–10.3)
Chloride: 100 mmol/L (ref 98–111)
Creatinine, Ser: 0.45 mg/dL (ref 0.44–1.00)
GFR calc Af Amer: >60 mL/min (ref >60)
GFR calc non Af Amer: >60 mL/min (ref >60)
Glucose, Bld: 96 mg/dL (ref 70–99)
Potassium: 4.2 mmol/L (ref 3.5–5.1)
Sodium: 135 mmol/L (ref 135–145)

## 2019-11-12 LAB — CBC
HCT: 33.6 % — ABNORMAL LOW (ref 36.0–46.0)
Hemoglobin: 10.6 g/dL — ABNORMAL LOW (ref 12.0–15.0)
MCH: 29.1 pg (ref 26.0–34.0)
MCHC: 31.5 g/dL (ref 30.0–36.0)
MCV: 92.3 fL (ref 80.0–100.0)
Platelets: 439 10*3/uL — ABNORMAL HIGH (ref 150–400)
RBC: 3.64 MIL/uL — ABNORMAL LOW (ref 3.87–5.11)
RDW: 15.2 % (ref 11.5–15.5)
WBC: 6.7 10*3/uL (ref 4.0–10.5)
nRBC: 0 % (ref 0.0–0.2)

## 2019-11-12 LAB — MAGNESIUM: Magnesium: 2.1 mg/dL (ref 1.7–2.4)

## 2019-11-12 MED ORDER — DIPHENOXYLATE-ATROPINE 2.5-0.025 MG/5ML PO LIQD: 5.0000 mL | Freq: Four times a day (QID) | ORAL | Status: DC | PRN

## 2019-11-12 NOTE — Progress Notes (Signed)
Ordered to remove foley. Tried to remove foley patient refused foley removal. Educated patient about infection risk. Notified MD.  Era Bumpers, RN

## 2019-11-12 NOTE — Progress Notes (Signed)
PROGRESS NOTE    Alexis Olson  IPJ:[id number] DOB: [birth year] DOA: 11/06/2019 PCP: Mosie Lukes, MD    Chief Complaint  Patient presents with  . Foot Pain  . Weakness    Brief Narrative:   86 year old lady with prior history of hypertension, GERD, diverticulosis, vitamin B12 deficiency, hyperlipidemia, anxiety disorder, recurrent urinary tract infection, resolved chronic low back pain presents to ED with an accidental fall at home several days prior to arrival to ED. Complaints of pain in the left foot and ankle.  On arrival patient was lethargic and confused.  X-rays of the ankle did not reveal any fracture.  Venous duplex of the lower extremity reveal an acute DVT of the left lower extremity. CT angiogram was negative for PE. Pt reports pain in the right hand, x rays show Mallet finger deformity of the index finger with possible Boutonniere deformity of the right little finger. Superior dislocation left fifth DIP. 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 She was started on pain meds, splint. Pt reports persistent pain and swelling . MRI of the hand shows Diffuse dorsal subcutaneous edema and increased signal throughout the muscles which could be due to myositis and cellulitis. No loculated fluid collections or evidence of osteomyelitis. Mild third and fourth digit flexor digitorum tenosynovitis. PT evaluation ordered recommending SNF. But family and patient would like to pursue CIR. Consult placed and pending insurance authorization.  Her right hand pain has improved. Her left foot still in splint. . She is still refusing to get the foley catheter out . Reports she is scared and will attempt the voiding trial at inpatient rehab when she is more active and moving around.  Discussed with the patient's daughter about complications of having a foley catheter.  Pt seen and examined today. No new complaints.    Assessment & Plan:     Active Problems:   Toxic metabolic encephalopathy   Metabolic encephalopathy and encephalopathy sec to polypharmacy.  Much improved, she is alert and oriented and answering all questions appropriately.  No new complaints.    Accidental fall at home.  CK levels minimally elevated.  Initial CT head and CT cervical spine ruled out fractures.  PT evaluation ordered recommending SNF. But family and patient would like to pursue CIR. Currently waiting for insurance auth for CIR.   GERD  Stable.    DVT  Of the left lower extremity.  Started on IV Heparin and transitioned to oral eliquis.  Hemoglobin stable around 10   Hypokalemia and hypomagnesemia Replaced, repeat level wnl.   Right hand swelling Differential include cellulitis vs soft tissues swelling from the fall.  Started the patient empirically on IV Ancef, colchicine,  Reviewed the x rays and MRI of the hand personally.  Afebrile today with mild leukocytosis.  CRP elevatd at 17.4, ESR at 104, pro calcitonin is 0.10 Completed the course of antibiotics.   Hyponatremia:  Probably from dehydration.  Resolved.    Anemia of chronic disease:  Anemia panel reviewed.  Iron deficiency,  Supplementation will be added.  Hemoglobin stable around 10.   Essential hypertension:  Well controlled.   Urinary retention :  Foley catheter placed for urinary retention on admission.  Voiding trial today, if she does not agree will request in pateint rehab to attempt the voiding trial    DVT prophylaxis: eliquis.  Code Status: full code.  Family Communication: none at bedside. Discussed with daughter over the phone on  11/12/2019.   Disposition:   Status is: Inpatient  Remains inpatient appropriate because:Unsafe d/c plan   Dispo: The patient is from: Home              Anticipated d/c is to: CIR              Anticipated d/c date is: 1 day              Patient currently is medically stable to d/c.       Consultants:    None.   Procedures: none.  Antimicrobials: Antibiotics Given (last 72 hours)    Date/Time Action Medication Dose Rate   11/09/19 1816 New Bag/Given   ceFAZolin (ANCEF) IVPB 1 g/50 mL premix 1 g 100 mL/hr   11/10/19 0115 New Bag/Given   ceFAZolin (ANCEF) IVPB 1 g/50 mL premix 1 g 100 mL/hr   11/10/19 0941 Given   cephALEXin (KEFLEX) capsule 500 mg 500 mg    11/10/19 2242 Given   cephALEXin (KEFLEX) capsule 500 mg 500 mg        Subjective: No new complaints.   Objective: Vitals:   11/11/19 2332 11/12/19 0440 11/12/19 0736 11/12/19 1126  BP: 129/68 125/65 112/65 (!) 128/59  Pulse: 95 98 (!) 101 100  Resp: _0 Temp: 98.6 F (37 C) 98.2 F (36.8 C) 98.7 F (37.1 C) 98.8 F (37.1 C)  TempSrc: Oral Oral Oral Oral  SpO2: 97% 95% 95% 96%  Weight:      Height:        Intake/Output Summary (Last 24 hours) at 11/12/2019 1346 Last data filed at 11/12/2019 1129 Gross per 24 hour  Intake 432.06 ml  Output 3300 ml  Net -2867.94 ml   Filed Weights   11/06/19 1206 11/06/19 1909  Weight: 79.8 kg 79.4 kg    Examination:  General exam: Alert and comfortable, not in any distress Respiratory system: Clear to auscultation bilaterally, no wheezing or rhonchi Cardiovascular system: S1-S2 heard, regular rate rhythm, no JVD, no pedal edema on the right foot Gastrointestinal system: Abdomen is soft, nontender, nondistended, bowel sounds normal Central nervous system: Alert and oriented, grossly nonfocal Extremities: Right hand swelling is resolved left foot swelling is improving and is in splint Skin: No rashes seen Psychiatry: Mood is appropriate    Data Reviewed: I have personally reviewed following labs and imaging studies  CBC: Recent Labs  Lab 11/06/19 1217 11/07/19 0222 11/08/19 0328 11/09/19 0449 11/12/19 0612  WBC 11.5* 10.8* 10.2 8.2 6.7  NEUTROABS 9.2*  --   --   --   --   HGB 10.2* 9.8* 9.5* 10.3* 10.6*  HCT 31.7* 30.4* 29.1* 31.7* 33.6*  MCV 93.0  91.8 89.5 90.1 92.3  PLT 263 260 296 333 439*    Basic Metabolic Panel: Recent Labs  Lab 11/06/19 1217 11/06/19 1836 11/07/19 0222 11/08/19 0328 11/09/19 0449 11/11/19 0947 11/12/19 0612  NA   < >  --  135 132* 134* 138 135  K   < >  --  3.3* 3.4* 3.7 2.8* 4.2  CL   < >  --  99 96* 98 100 100  CO2   < >  --  _1 GLUCOSE   < >  --  132* 124* 105* 109* 96  BUN   < >  --  8 6* _2 CREATININE   < >  --  0.52 0.48 0.44 0.54 0.45  CALCIUM   < >  --  8.2* 7.9* 8.5* 8.7* 8.6*  MG  --  1.4*  --   --   --  1.7 2.1   < > = values in this interval not displayed.    GFR: Estimated Creatinine Clearance: 54.5 mL/min (by C-G formula based on SCr of 0.45 mg/dL).  Liver Function Tests: Recent Labs  Lab 11/06/19 1217 11/07/19 0222  AST 21 27  ALT 20 28  ALKPHOS 47 47  BILITOT 0.5 0.5  PROT 6.1* 5.6*  ALBUMIN 2.5* 2.3*    CBG: Recent Labs  Lab 11/06/19 1223 11/06/19 2037 11/07/19 0628  GLUCAP 180* 112* 109*     Recent Results (from the past 240 hour(s))  SARS Coronavirus 2 by RT PCR (hospital order, performed in [facility] hospital lab) Nasopharyngeal Nasopharyngeal Swab     Status: None   Collection Time: 11/06/19 12:21 PM   Specimen: Nasopharyngeal Swab  Result Value Ref Range Status   SARS Coronavirus 2 NEGATIVE NEGATIVE Final    Comment: (NOTE) SARS-CoV-2 target nucleic acids are NOT DETECTED.  The SARS-CoV-2 RNA is generally detectable in upper and lower respiratory specimens during the acute phase of infection. The lowest concentration of SARS-CoV-2 viral copies this assay can detect is 250 copies / mL. A negative result does not preclude SARS-CoV-2 infection and should not be used as the sole basis for treatment or other patient management decisions.  A negative result may occur with improper specimen collection / handling, submission of specimen other than nasopharyngeal swab, presence of viral mutation(s) within the areas targeted by this  assay, and inadequate number of viral copies (<250 copies / mL). A negative result must be combined with clinical observations, patient history, and epidemiological information.  Fact Sheet for Patients:   StrictlyIdeas.no  Fact Sheet for Healthcare Providers: BankingDealers.co.za  This test is not yet approved or  cleared by the [place] FDA and has been authorized for detection and/or diagnosis of SARS-CoV-2 by FDA under an Emergency Use Authorization (EUA).  This EUA will remain in effect (meaning this test can be used) for the duration of the COVID-19 declaration under Section 564(b)(1) of the Act, 21 U.S.C. section 360bbb-3(b)(1), unless the authorization is terminated or revoked sooner.  Performed at [facility], [place] [address] [place], [place] [address]   Urine culture     Status: None   Collection Time: 11/06/19  4:38 PM   Specimen: Urine, Random  Result Value Ref Range Status   Specimen Description URINE, RANDOM  Final   Special Requests NONE  Final   Culture   Final    NO GROWTH Performed at [facility], [place] [address], [place], [place] [address]    Report Status 11/07/2019 FINAL  Final  Culture, blood (routine x 2)     Status: None   Collection Time: 11/06/19  8:37 PM   Specimen: BLOOD  Result Value Ref Range Status   Specimen Description BLOOD RIGHT ANTECUBITAL  Final   Special Requests   Final    BOTTLES DRAWN AEROBIC AND ANAEROBIC Blood Culture adequate volume   Culture   Final    NO GROWTH 5 DAYS Performed at [facility], [place] [address] [place], [place] [address]    Report Status 11/11/2019 FINAL  Final  Culture, blood (routine x 2)     Status: None   Collection Time: 11/06/19  8:40 PM   Specimen: BLOOD RIGHT HAND  Result Value Ref Range Status   Specimen Description BLOOD  RIGHT HAND  Final   Special Requests   Final    BOTTLES DRAWN AEROBIC AND ANAEROBIC Blood Culture adequate  volume   Culture   Final    NO GROWTH 5 DAYS Performed at [facility], [address] [place], [place] [address]    Report Status 11/11/2019 FINAL  Final         Radiology Studies: No results found.      Scheduled Meds: . acidophilus  1 capsule Oral Daily  . amitriptyline  100 mg Oral QPM  . apixaban  10 mg Oral BID   Followed by  . [START ON 11/15/2019] apixaban  5 mg Oral BID  . Chlorhexidine Gluconate Cloth  6 each Topical Daily  . docusate sodium  100 mg Oral BID  . ferrous sulfate  325 mg Oral BID WC  . fluticasone  2 spray Each Nare Daily  . gabapentin  300 mg Oral QHS  . Gerhardt's butt cream   Topical QID  . loratadine  10 mg Oral Daily  . mometasone-formoterol  2 puff Inhalation BID  . montelukast  10 mg Oral QHS  . multivitamin with minerals  1 tablet Oral Daily  . pantoprazole  40 mg Oral Daily  . polyethylene glycol  17 g Oral BID  . tamsulosin  0.4 mg Oral QPC supper   Continuous Infusions:    LOS: 6 days        Hosie Poisson, MD [facility]   To contact the attending provider between 7A-7P or the covering provider during after hours 7P-7A, please log into the web site [email or web address] and access using universal [place] password for that web site. If you do not have the password, please call the hospital operator.  11/12/2019, 1:46 PM

## 2019-11-12 NOTE — Progress Notes (Signed)
Inpatient Rehab Admissions Coordinator:   Notified by patient's insurance company that request for CIR has been denied due to inability to tolerate and lack of medical necessity.  Pt will need to seek rehab in lower level of care.  Will let TOC team know and sign off for CIR at this time.   Shann Medal, PT, DPT Admissions Coordinator [phone] 11/12/19  3:17 PM

## 2019-11-12 NOTE — Progress Notes (Signed)
Physical Therapy Treatment Patient Details Name: Alexis Olson MRN: [id number] DOB: October 15, 1933 Today's Date: 11/12/2019    History of Present Illness Pt is an 86 y.o. female admitted 11/06/19 after fall at home several days prior, pt with AMS/lethargy and c/o L foot and R hand pain. Pt with acute LLE DVT. L foot imaging negative for acute injury, diffuse soft tissue swelling. 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 Workup for toxic metabolic encephalopathy secondary to polypharmacy. Other PMH includes HTN, HLD, recurrent UTI, anxiety.    PT Comments    Pt progressing well towards her physical therapy goals, as evidenced by improved activity tolerance and ambulation distance.  Session focused on bed level therapeutic exercises and functional mobility. Pt still requiring significant assist with transitions to standing, but ambulating 50 feet with a walker and chair follow. Continue to recommend CIR and suspect excellent progress based on PLOF, motivation, and family support.    Follow Up Recommendations  CIR     Equipment Recommendations  Rolling walker with 5" wheels    Recommendations for Other Services       Precautions / Restrictions Precautions Precautions: Fall;Other (comment) Required Braces or Orthoses: Splint/Cast Splint/Cast: L ankle splint - no acute injury per imaging, (+) swelling; order for R wrist splint but notes state no longer needed Restrictions Weight Bearing Restrictions: No    Mobility  Bed Mobility Overal bed mobility: Needs Assistance Bed Mobility: Supine to Sit     Supine to sit: Min assist     General bed mobility comments: Cues for initiation/sequencing, reciprocal scooting to edge of bed. MinA for trunk elevation to upright via HHA.   Transfers Overall transfer level: Needs assistance Equipment used: Rolling walker (2 wheeled) Transfers: Sit to/from  Stand Sit to Stand: Mod assist;+2 physical assistance         General transfer comment: Heavy modA + 2 to boost to upright from edge of bed, cues for transition of hands to walker, placing RLE posteriorly, wider BOS  Ambulation/Gait Ambulation/Gait assistance: Min assist;+2 safety/equipment Gait Distance (Feet): 50 Feet Assistive device: Rolling walker (2 wheeled) Gait Pattern/deviations: Shuffle;Decreased stride length;Step-to pattern;Trunk flexed Gait velocity: decreased Gait velocity interpretation: <1.31 ft/sec, indicative of household ambulator General Gait Details: Cues for sequencing, upright posture, close chair follow utilized. MinA for stability   Stairs             Wheelchair Mobility    Modified Rankin (Stroke Patients Only)       Balance Overall balance assessment: Needs assistance Sitting-balance support: No upper extremity supported Sitting balance-Leahy Scale: Good     Standing balance support: Bilateral upper extremity supported Standing balance-Leahy Scale: Poor                              Cognition Arousal/Alertness: Awake/alert Behavior During Therapy: WFL for tasks assessed/performed Overall Cognitive Status: Within Functional Limits for tasks assessed                                        Exercises General Exercises - Lower Extremity Quad Sets: Both;10 reps;Supine Heel Slides: Both;5 reps;Supine    General Comments        Pertinent Vitals/Pain Pain Assessment: Faces Faces Pain Scale: Hurts little more Pain Location: left ankle Pain  Descriptors / Indicators: Sore Pain Intervention(s): Monitored during session    Home Living                      Prior Function            PT Goals (current goals can now be found in the care plan section) Acute Rehab PT Goals Patient Stated Goal: Go home Potential to Achieve Goals: Good Progress towards PT goals: Progressing toward goals     Frequency    Min 3X/week      PT Plan Current plan remains appropriate    Co-evaluation              AM-PAC PT "6 Clicks" Mobility   Outcome Measure  Help needed turning from your back to your side while in a flat bed without using bedrails?: None Help needed moving from lying on your back to sitting on the side of a flat bed without using bedrails?: A Little Help needed moving to and from a bed to a chair (including a wheelchair)?: A Little Help needed standing up from a chair using your arms (e.g., wheelchair or bedside chair)?: A Lot Help needed to walk in hospital room?: A Little Help needed climbing 3-5 steps with a railing? : A Lot 6 Click Score: 17    End of Session Equipment Utilized During Treatment: Gait belt Activity Tolerance: Patient tolerated treatment well Patient left: in chair;with call bell/phone within reach;with family/visitor present Nurse Communication: Mobility status PT Visit Diagnosis: Other abnormalities of gait and mobility (R26.89);Muscle weakness (generalized) (M62.81);Pain     Time: 1735-6701 PT Time Calculation (min) (ACUTE ONLY): 34 min  Charges:  $Gait Training: 8-22 mins $Therapeutic Activity: 8-22 mins                       Wyona Almas, PT, DPT Acute Rehabilitation Services Pager [phone] Office [phone]    Deno Etienne 11/12/2019, 3:25 PM

## 2019-11-13 ENCOUNTER — Telehealth: Payer: Self-pay | Admitting: Internal Medicine

## 2019-11-13 DIAGNOSIS — D638 Anemia in other chronic diseases classified elsewhere: Secondary | ICD-10-CM

## 2019-11-13 DIAGNOSIS — I10 Essential (primary) hypertension: Secondary | ICD-10-CM

## 2019-11-13 DIAGNOSIS — I82409 Acute embolism and thrombosis of unspecified deep veins of unspecified lower extremity: Secondary | ICD-10-CM

## 2019-11-13 DIAGNOSIS — R338 Other retention of urine: Secondary | ICD-10-CM

## 2019-11-13 DIAGNOSIS — W19XXXS Unspecified fall, sequela: Secondary | ICD-10-CM

## 2019-11-13 DIAGNOSIS — M7989 Other specified soft tissue disorders: Secondary | ICD-10-CM

## 2019-11-13 NOTE — Telephone Encounter (Signed)
Patient called said she is at the hospital and is requesting to speak with you did not want to say why.

## 2019-11-13 NOTE — Progress Notes (Signed)
Physical Therapy Treatment Patient Details Name: Alexis Olson MRN: [id number] DOB: November 04, 1933 Today's Date: 11/13/2019    History of Present Illness Pt is an 86 y.o. female admitted 11/06/19 after fall at home several days prior, pt with AMS/lethargy and c/o L foot and R hand pain. Pt with acute LLE DVT. L foot imaging negative for acute injury, diffuse soft tissue swelling. 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 Workup for toxic metabolic encephalopathy secondary to polypharmacy. Other PMH includes HTN, HLD, recurrent UTI, anxiety.    PT Comments    Pt making good progress.  She was able to increase ambulation distance with min guard.  Continues to have difficulty with bed mobility and sit to stand. Session focused on cueing for correct positioning and transfer technique.  Pt requiring multimodal cues, increased time, and rest breaks.  Additionally, ankle splint providing little support during ambulation and pt with no pain -messaged MD regarding continuation of splint is necessary.  Did readjust splint for better positioning upon sitting.  Did note that ins has declined CIR and family refusing SNF.  At this time, PT still recommends that pt not safe to return home. Pt reports they are appealing CIR denial.     Follow Up Recommendations  CIR (noted ins had declined CIR but pt reports they are appealing)     Equipment Recommendations  Rolling walker with 5" wheels    Recommendations for Other Services       Precautions / Restrictions Precautions Precautions: Fall;Other (comment) Required Braces or Orthoses: Splint/Cast Splint/Cast: L ankle splint - no acute injury per imaging, (+) swelling; order for R wrist splint but notes state no longer needed    Mobility  Bed Mobility Overal bed mobility: Needs Assistance Bed Mobility: Supine to Sit     Supine to sit: Min assist      General bed mobility comments: Cues for initiation/sequencing, reciprocal scooting to edge of bed. MinA for trunk elevation to upright via HHA.   Transfers Overall transfer level: Needs assistance Equipment used: Rolling walker (2 wheeled) Transfers: Sit to/from Stand Sit to Stand: Mod assist         General transfer comment: Performed sit to stand x 6 throughout session.  Cues to focus on getting to EOB, hand placement, feet under body, leaning forward, and use of momentum.  Pt slow to rise and required mod A to power up  Ambulation/Gait Ambulation/Gait assistance: Min guard Gait Distance (Feet): 100 Feet Assistive device: Rolling walker (2 wheeled) Gait Pattern/deviations: Decreased stride length Gait velocity: decreased   General Gait Details: Pt required cues for RW proximity and min guard for safety. Otherwise tolerated well.   Stairs             Wheelchair Mobility    Modified Rankin (Stroke Patients Only)       Balance Overall balance assessment: Needs assistance Sitting-balance support: No upper extremity supported Sitting balance-Leahy Scale: Good     Standing balance support: Bilateral upper extremity supported Standing balance-Leahy Scale: Poor Standing balance comment: Required RW; min A to initially steady but then just min guard                            Cognition Arousal/Alertness: Awake/alert Behavior During Therapy: WFL for tasks assessed/performed Overall Cognitive Status: Within Functional Limits for tasks assessed  Exercises      General Comments General comments (skin integrity, edema, etc.): Pt's max HR 122 bpm ; otherwise VSS.  Noted ankle splint in poor position providing little support with walking.  Pt reports no ankle pain.  Xray revealed no ankle injury.  Sent message to MD regarding possible discontinuation of ankle splint.  Did reapply ACE wrap/splint for  improved position upon return to room.      Pertinent Vitals/Pain Pain Assessment: No/denies pain Pain Location: Reports some discomfort in L foot but no pain    Home Living                      Prior Function            PT Goals (current goals can now be found in the care plan section) Acute Rehab PT Goals Patient Stated Goal: get stronger PT Goal Formulation: With patient Time For Goal Achievement: 11/24/19 Potential to Achieve Goals: Good Progress towards PT goals: Progressing toward goals    Frequency    Min 3X/week      PT Plan Current plan remains appropriate    Co-evaluation              AM-PAC PT "6 Clicks" Mobility   Outcome Measure  Help needed turning from your back to your side while in a flat bed without using bedrails?: None Help needed moving from lying on your back to sitting on the side of a flat bed without using bedrails?: A Little Help needed moving to and from a bed to a chair (including a wheelchair)?: A Little Help needed standing up from a chair using your arms (e.g., wheelchair or bedside chair)?: A Lot Help needed to walk in hospital room?: A Little Help needed climbing 3-5 steps with a railing? : A Lot 6 Click Score: 17    End of Session Equipment Utilized During Treatment: Gait belt Activity Tolerance: Patient tolerated treatment well Patient left: in chair;with call bell/phone within reach;with family/visitor present Nurse Communication: Mobility status PT Visit Diagnosis: Other abnormalities of gait and mobility (R26.89);Muscle weakness (generalized) (M62.81);Pain     Time: 0762-2633 PT Time Calculation (min) (ACUTE ONLY): 40 min  Charges:  $Gait Training: 8-22 mins $Therapeutic Activity: 23-37 mins                     Abran Richard, PT Acute Rehab Services Pager [phone] [facility] 11/13/2019, 2:24 PM

## 2019-11-13 NOTE — Progress Notes (Signed)
Inpatient Rehabilitation Admissions Coordinator  I received call from pt's daughter, Scarlet. I Informed her that insurance has denied CIR admit. She states patient will not go to SNF.  Danne Baxter, RN, MSN Rehab Admissions Coordinator [phone] 11/13/2019 9:22 AM

## 2019-11-13 NOTE — Progress Notes (Addendum)
[facility] PROGRESS NOTE    Progress Note  Alexis Olson  XIP:[id number] DOB: [birth year] DOA: 11/06/2019 PCP: Mosie Lukes, MD     Brief Narrative:   Alexis Olson is an 86 y.o. female past medical history of essential hypertension, GERD, diverticulosis recurrent UTIs, chronic back pain comes into the ED for an accidental fall.  To the ED she was found to be lethargic and confused x-ray did not reveal any fractures in her lower extremity lower extremity Doppler revealed an acute DVT CT angio was negative for PE.  An x-ray of her right hand showed mallet finger deformities with a superior dislocation of the fifth DIP.  RI of the hand show diffuse dorsal edema with myositis and cellulitis no loculated collection of fluid or osteomyelitis.  Some mild third and fourth digit flexor digitorum tenosynovitis.  Assessment/Plan:   Acute metabolic encephalopathy: Likely due to polypharmacy.  Resolved with holding medications now encephalopathy is resolved.  Accidental fall at home: CT scan of the head and C-spine showed no acute findings. Physical therapy evaluated the patient recommended skilled nursing facility.  But family would like to pursue CIR, insurance has denied patient for inpatient rehab, as the patient will require a lot of assistance and is significantly debilitated she will probably benefit from skilled.  I have tried to explain this to the patient and daughter but they keep demanding and insisting that the patient needs to go to CIR. Daughter threatened me a bad patient satisfaction score if her demands were not met.  GERD: Stable.  DVT: Of left lower extremity: Hemoglobin stable at 9 continue Eliquis.  Right hand swelling: She was started empirically on Ancef and colchicine.   She has remained afebrile, her leukocytosis is slowly improved. She has completed her course of antibiotics. Procalcitonin was less than 0.1.  Hypovolemic hyponatremia Resolved with IV  fluid hydration.  Anemia of chronic disease: Likely due to iron deficiency, continue iron supplementation.  Essential hypertension Well-controlled.  Urinary retention: She needs to have a voiding trial today we will discontinue Foley.     DVT prophylaxis: Eliquis Family Communication:Sister Status is: Inpatient  Remains inpatient appropriate because:Unsafe d/c plan   Dispo: The patient is from: Home              Anticipated d/c is to: SNF              Anticipated d/c date is: 1 day              Patient currently is medically stable to d/c.        Code Status:     Code Status Orders  (From admission, onward)         Start     Ordered   11/06/19 1854  Full code  Continuous        11/06/19 1855        Code Status History    Date Active Date Inactive Code Status Order ID Comments User Context   08/26/2019 1020 08/29/2019 2331 Full Code [id number]  Norman Herrlich Inpatient   09/18/2014 1543 09/26/2014 2022 Full Code [id number]  Erby Pian, NP ED   Advance Care Planning Activity        IV Access:    Peripheral IV   Procedures and diagnostic studies:   No results found.   Medical Consultants:    None.  Anti-Infectives:   none  Subjective:    [name] patient relates she does not  want her Foley discontinued.  Objective:    Vitals:   11/12/19 2015 11/13/19 0007 11/13/19 0628 11/13/19 0907  BP: 113/74 (!) 112/47 133/74 118/62  Pulse:  91  100  Resp: _0 Temp: 98.4 F (36.9 C) 98.3 F (36.8 C) 98.6 F (37 C) 98.5 F (36.9 C)  TempSrc: Oral Oral Oral Oral  SpO2: 98% 95% 100% 100%  Weight:      Height:       SpO2: 100 %   Intake/Output Summary (Last 24 hours) at 11/13/2019 1009 Last data filed at 11/13/2019 0938 Gross per 24 hour  Intake 994 ml  Output 4350 ml  Net -3356 ml   Filed Weights   11/06/19 1206 11/06/19 1909  Weight: 79.8 kg 79.4 kg    Exam: General exam: In no acute distress. Respiratory  system: Good air movement and clear to auscultation. Cardiovascular system: S1 & S2 heard, RRR. No JVD. Gastrointestinal system: Abdomen is nondistended, soft and nontender.  Extremities: No pedal edema. Skin: No rashes, lesions or ulcers Psychiatry: Judgement and insight appear normal. Mood & affect appropriate.    Data Reviewed:    Labs: Basic Metabolic Panel: Recent Labs  Lab 11/06/19 1217 11/06/19 1836 11/07/19 0222 11/07/19 0222 11/08/19 1610 11/08/19 9604 11/09/19 5409 11/09/19 0449 11/11/19 0947 11/12/19 0612  NA   < >  --  135  --  132*  --  134*  --  138 135  K   < >  --  3.3*   < > 3.4*   < > 3.7   < > 2.8* 4.2  CL   < >  --  99  --  96*  --  98  --  100 100  CO2   < >  --  26  --  26  --  25  --  26 26  GLUCOSE   < >  --  132*  --  124*  --  105*  --  109* 96  BUN   < >  --  8  --  6*  --  8  --  12 11  CREATININE   < >  --  0.52  --  0.48  --  0.44  --  0.54 0.45  CALCIUM   < >  --  8.2*  --  7.9*  --  8.5*  --  8.7* 8.6*  MG  --  1.4*  --   --   --   --   --   --  1.7 2.1   < > = values in this interval not displayed.   GFR Estimated Creatinine Clearance: 54.5 mL/min (by C-G formula based on SCr of 0.45 mg/dL). Liver Function Tests: Recent Labs  Lab 11/06/19 1217 11/07/19 0222  AST 21 27  ALT 20 28  ALKPHOS 47 47  BILITOT 0.5 0.5  PROT 6.1* 5.6*  ALBUMIN 2.5* 2.3*   No results for input(s): LIPASE, AMYLASE in the last 168 hours. Recent Labs  Lab 11/07/19 0222  AMMONIA 45*   Coagulation profile No results for input(s): INR, PROTIME in the last 168 hours. COVID-19 Labs  No results for input(s): DDIMER, FERRITIN, LDH, CRP in the last 72 hours.  Lab Results  Component Value Date   SARSCOV2NAA NEGATIVE 11/06/2019   [place] NEGATIVE 08/22/2019    CBC: Recent Labs  Lab 11/06/19 1217 11/07/19 0222 11/08/19 0328 11/09/19 0449 11/12/19 0612  WBC 11.5* 10.8* 10.2 8.2 6.7  NEUTROABS 9.2*  --   --   --   --   HGB 10.2* 9.8* 9.5* 10.3*  10.6*  HCT 31.7* 30.4* 29.1* 31.7* 33.6*  MCV 93.0 91.8 89.5 90.1 92.3  PLT 263 260 296 333 439*   Cardiac Enzymes: Recent Labs  Lab 11/07/19 0222  CKTOTAL 28*   BNP (last 3 results) No results for input(s): PROBNP in the last 8760 hours. CBG: Recent Labs  Lab 11/06/19 1223 11/06/19 2037 11/07/19 0628  GLUCAP 180* 112* 109*   D-Dimer: No results for input(s): DDIMER in the last 72 hours. Hgb A1c: No results for input(s): HGBA1C in the last 72 hours. Lipid Profile: No results for input(s): CHOL, HDL, LDLCALC, TRIG, CHOLHDL, LDLDIRECT in the last 72 hours. Thyroid function studies: No results for input(s): TSH, T4TOTAL, T3FREE, THYROIDAB in the last 72 hours.  Invalid input(s): FREET3 Anemia work up: No results for input(s): VITAMINB12, FOLATE, FERRITIN, TIBC, IRON, RETICCTPCT in the last 72 hours. Sepsis Labs: Recent Labs  Lab 11/06/19 1217 11/06/19 1217 11/06/19 1836 11/07/19 0222 11/08/19 0328 11/09/19 0449 11/12/19 0612  PROCALCITON  --   --   --   --  0.10 <0.10  --   WBC 11.5*   < >  --  10.8* 10.2 8.2 6.7  LATICACIDVEN 1.3  --  1.0  --   --   --   --    < > = values in this interval not displayed.   Microbiology Recent Results (from the past 240 hour(s))  SARS Coronavirus 2 by RT PCR (hospital order, performed in [facility] hospital lab) Nasopharyngeal Nasopharyngeal Swab     Status: None   Collection Time: 11/06/19 12:21 PM   Specimen: Nasopharyngeal Swab  Result Value Ref Range Status   SARS Coronavirus 2 NEGATIVE NEGATIVE Final    Comment: (NOTE) SARS-CoV-2 target nucleic acids are NOT DETECTED.  The SARS-CoV-2 RNA is generally detectable in upper and lower respiratory specimens during the acute phase of infection. The lowest concentration of SARS-CoV-2 viral copies this assay can detect is 250 copies / mL. A negative result does not preclude SARS-CoV-2 infection and should not be used as the sole basis for treatment or other patient management  decisions.  A negative result may occur with improper specimen collection / handling, submission of specimen other than nasopharyngeal swab, presence of viral mutation(s) within the areas targeted by this assay, and inadequate number of viral copies (<250 copies / mL). A negative result must be combined with clinical observations, patient history, and epidemiological information.  Fact Sheet for Patients:   StrictlyIdeas.no  Fact Sheet for Healthcare Providers: BankingDealers.co.za  This test is not yet approved or  cleared by the [place] FDA and has been authorized for detection and/or diagnosis of SARS-CoV-2 by FDA under an Emergency Use Authorization (EUA).  This EUA will remain in effect (meaning this test can be used) for the duration of the COVID-19 declaration under Section 564(b)(1) of the Act, 21 U.S.C. section 360bbb-3(b)(1), unless the authorization is terminated or revoked sooner.  Performed at [facility], [place] [address] [place], [place] [address]   Urine culture     Status: None   Collection Time: 11/06/19  4:38 PM   Specimen: Urine, Random  Result Value Ref Range Status   Specimen Description URINE, RANDOM  Final   Special Requests NONE  Final   Culture   Final    NO GROWTH Performed at [facility], [place]  [address] [place], [place] [address]    Report Status 11/07/2019 FINAL  Final  Culture, blood (routine x 2)     Status: None   Collection Time: 11/06/19  8:37 PM   Specimen: BLOOD  Result Value Ref Range Status   Specimen Description BLOOD RIGHT ANTECUBITAL  Final   Special Requests   Final    BOTTLES DRAWN AEROBIC AND ANAEROBIC Blood Culture adequate volume   Culture   Final    NO GROWTH 5 DAYS Performed at [facility], [place] [address], [place], [place] [address]    Report Status 11/11/2019 FINAL  Final  Culture, blood (routine x 2)     Status: None   Collection Time: 11/06/19   8:40 PM   Specimen: BLOOD RIGHT HAND  Result Value Ref Range Status   Specimen Description BLOOD RIGHT HAND  Final   Special Requests   Final    BOTTLES DRAWN AEROBIC AND ANAEROBIC Blood Culture adequate volume   Culture   Final    NO GROWTH 5 DAYS Performed at [facility], [place] [address] [place], [place] [address]    Report Status 11/11/2019 FINAL  Final     Medications:   . acidophilus  1 capsule Oral Daily  . amitriptyline  100 mg Oral QPM  . apixaban  10 mg Oral BID   Followed by  . [START ON 11/15/2019] apixaban  5 mg Oral BID  . Chlorhexidine Gluconate Cloth  6 each Topical Daily  . docusate sodium  100 mg Oral BID  . ferrous sulfate  325 mg Oral BID WC  . fluticasone  2 spray Each Nare Daily  . gabapentin  300 mg Oral QHS  . Gerhardt's butt cream   Topical QID  . loratadine  10 mg Oral Daily  . mometasone-formoterol  2 puff Inhalation BID  . montelukast  10 mg Oral QHS  . multivitamin with minerals  1 tablet Oral Daily  . pantoprazole  40 mg Oral Daily  . polyethylene glycol  17 g Oral BID  . tamsulosin  0.4 mg Oral QPC supper   Continuous Infusions:    LOS: 7 days   Charlynne Cousins  [facility]  11/13/2019, 10:09 AM

## 2019-11-13 NOTE — Telephone Encounter (Signed)
She wanted to let me know she was in the hospital and that's why her husband missed his appointment. She was checking to see if we had refilled his reglan yet. I told her we'll get that addressed tomorrow.

## 2019-11-13 NOTE — TOC Initial Note (Signed)
Transition of Care (TOC) - Initial/Assessment Note  Alexis Gibbons RN, BSN Transitions of Care Unit 4E- RN Case Manager See Treatment Team for direct phone #    Patient Details  Name: Alexis Olson MRN: [id number] Date of Birth: Feb 13, 1934  Transition of Care [facility]) CM/SW Contact:    Dawayne Patricia, RN Phone Number: 11/13/2019, 4:23 PM  Clinical Narrative:                 Pt admitted from home with spouse, new Eliquis on this admission- per benefits check- copay cost $45. Recommendations for CIR, consult placed and insurance auth started. However was notified by [facility] that patients insurance had denied INPT stay. Per Pamala Hurry with CIR, family would need to call insurance provider to see if there is an option to appeal decision for INPT rehab. Otherwise pt and family need to decide transition plan SNF vs Home with [facility]. CM in to bedside to speak with pt and spouse. Per conversation pt states she is not interested in SNF and wanted to know why her insurance denied INPT rehab- explained to pt that insurance did not give a reason for denial- however she could call insurance provider regarding denial and see if there is an option to appeal. Pt states that if she can not go to CIR then her choice would be to return home with [facility]. She reports that she has needed DME at home (following knee surgery June of this year) she also reports that she had [place] at that time but does not remember the agency name- CM will provide list for [facility] choice should it be needed for review Per CMS guidelines from medicare.gov website with star ratings (copy placed in shadow chart) Pt states her daughter Alexis Olson is working with doctor on [facility]- asked if she wanted this Probation officer to call her daughter to review the appeal option for inpt rehab- pt voiced yes.   Call made to daughter Alexis Olson to review transition plan and needs. Reviewed insurance denial for INPT rehab-she informed this Probation officer that MD was resubmitting info  for [facility]. This Probation officer explained that on this end MD was unable to re-submit anything on patient behalf as per CIR they would not be resubmitting for authorization as insurance had already denied for admission. Explained to daughter that she would need to call [employer] herself to find out if there is an option to appeal and initiate appeal herself if that is an option. Daughter reports she has called and is awaiting return call. Requested daughter update TOC team after she speaks with insurance and let us know if an appeal has been started.  Discussed with daughter that if insurance still denies INPT rehab what the alternate plan was- she confirmed that plan would be to return home with [facility]- explained that list for [facility] choice was left at the bedside with pt to review.  TOC will f/u with pt/daughter for transition plan.   Expected Discharge Plan: IP Rehab Facility Barriers to Discharge: Insurance Authorization (family calling insurance to see about appeal option for INPT rehab)   Patient Goals and CMS Choice   CMS Medicare.gov Compare Post Acute Care list provided to:: Patient Choice offered to / list presented to : Patient, Spouse  Expected Discharge Plan and Services Expected Discharge Plan: [place]   Discharge Planning Services: CM Consult Post Acute Care Choice: [place] arrangements for the past 2 months: [place]  Prior Living Arrangements/Services Living arrangements for the past 2 months: Single Family Home Lives with:: Spouse Patient language and need for interpreter reviewed:: Yes Do you feel safe going back to the place where you live?: No   feels like she needs INPT rehab  Need for Family Participation in Patient Care: Yes (Comment) Care giver support system in place?: Yes (comment) Current home services: DME (walker, 3n1) Criminal Activity/Legal Involvement Pertinent to Current  Situation/Hospitalization: No - Comment as needed  Activities of Daily Living      Permission Sought/Granted Permission sought to share information with : Family Supports Permission granted to share information with : Yes, Verbal Permission Granted  Share Information with NAME: Alexis Olson     Permission granted to share info w Relationship: daughter  Permission granted to share info w Contact Information: [phone]  Emotional Assessment Appearance:: Appears stated age Attitude/Demeanor/Rapport: Complaining Affect (typically observed): Appropriate Orientation: : Oriented to Self, Oriented to Place, Oriented to  Time, Oriented to Situation Alcohol / Substance Use: Not Applicable Psych Involvement: No (comment)  Admission diagnosis:  Toxic metabolic encephalopathy [L07] Acute deep vein thrombosis (DVT) of proximal vein of left lower extremity (HCC) [I82.4Y2] Sprain of left ankle, unspecified ligament, initial encounter [S93.402A] Patient Active Problem List   Diagnosis Date Noted  . Late effects of accidental fall 11/13/2019  . DVT (deep venous thrombosis) ([place]) 11/13/2019  . Hypomagnesemia 11/13/2019  . Swelling of right hand 11/13/2019  . Acute urinary retention 11/13/2019  . Toxic metabolic encephalopathy 86/75/4492  . S/P right TKA 08/26/2019  . Ventral hernia without obstruction or gangrene 05/22/2018  . [place] filter in place 03/08/2017  . Pain of right lower extremity 03/08/2017  . Preventative health care 02/13/2017  . Hyponatremia 05/02/2016  . GERD (gastroesophageal reflux disease) 08/23/2015  . HSV (herpes simplex virus) infection 08/16/2015  . Hyperlipidemia, mild 08/16/2015  . Osteoporosis   . Hypokalemia   . Chronic diarrhea   . Anemia of chronic disease   . OSTEOMYELITIS, VERTEBRA 05/05/2009  . [place] DISEASE, LUMBOSACRAL SPINE 04/22/2009  . Essential hypertension 03/30/2009  . Vitamin B 12 deficiency 07/09/2008  . DIVERTICULOSIS, COLON  07/09/2008  . COLONIC POLYPS, ADENOMATOUS, HX OF 07/09/2008  . Anxiety state 08/08/2007  . INSOMNIA 08/08/2007  . Bariatric surgery status 08/08/2007  . Midline back pain 10/30/2006  . ALLERGIC RHINITIS 09/18/2006   PCP:  Mosie Lukes, MD Pharmacy:   [place], [place] [address] Phone: [phone] Fax: [phone]     Social Determinants of Health (SDOH) Interventions    Readmission Risk Interventions No flowsheet data found.

## 2019-11-14 NOTE — TOC Progression Note (Addendum)
Transition of Care (TOC) - Progression Note  Marvetta Gibbons RN, BSN Transitions of Care Unit 4E- RN Case Manager See Treatment Team for direct phone #    Patient Details  Name: Alexis Olson MRN: [id number] Date of Birth: [birth year]  Transition of Care [facility]) CM/SW Contact  Dahlia Client, Romeo Rabon, RN Phone Number: 11/14/2019, 2:52 PM  Clinical Narrative:    Spoke with daughter Darrick Grinder regarding transition plans- per Darrick Grinder they have started expedited appeal with insurance provider [employer]- we will await this process to see if denial for INPT rehab will be overturned. Explained to daughter that if they continue to deny for [facility] rehab admission then we will need to move forward with plan for return home with [facility]- daughter again confirms they do not want SNF placement. Patient has all needed DME at home should she return home.  Discussed with Scarlett that they would just need to select [facility] agency for services and [facility] staff would take care of making [facility] referral on patient's behalf should they choose to go home with [facility]  At this time they want to see what becomes of the appeal with insurance. CIR will follow along as well.   Daughter also requested to see if [facility] could be placed in room for mom to use while here- will speak with Bedside RN regarding this.   Expected Discharge Plan: IP Rehab Facility Barriers to Discharge: Insurance Authorization (family calling insurance to see about appeal option for INPT rehab)  Expected Discharge Plan and Services Expected Discharge Plan: [place]   Discharge Planning Services: CM Consult Post Acute Care Choice: [place] arrangements for the past 2 months: Single Family Home                                       Social Determinants of Health (SDOH) Interventions    Readmission Risk Interventions No flowsheet data found.

## 2019-11-14 NOTE — Progress Notes (Signed)
[facility] PROGRESS NOTE    Progress Note  Alexis Olson  ZHY:[id number] DOB: 12-06-1933 DOA: 11/06/2019 PCP: Mosie Lukes, MD     Brief Narrative:   Alexis Olson is an 86 y.o. female past medical history of essential hypertension, GERD, diverticulosis recurrent UTIs, chronic back pain comes into the ED for an accidental fall.  To the ED she was found to be lethargic and confused x-ray did not reveal any fractures in her lower extremity lower extremity Doppler revealed an acute DVT CT angio was negative for PE.  An x-ray of her right hand showed mallet finger deformities with a superior dislocation of the fifth DIP.  RI of the hand show diffuse dorsal edema with myositis and cellulitis no loculated collection of fluid or osteomyelitis.  Some mild third and fourth digit flexor digitorum tenosynovitis.  Assessment/Plan:   Acute metabolic encephalopathy: Likely due to polypharmacy.  Resolved with holding medications now encephalopathy is resolved.  Accidental fall at home: CT scan of the head and C-spine showed no acute findings. Physical therapy evaluated the patient recommended skilled nursing facility.  But family would like to pursue CIR, insurance has denied patient for inpatient rehab.  Daughter is appealing to [employer] as she is saying that her mother should go to CIR. Awaiting for TOC further recommendations  GERD: Stable.  DVT: Of left lower extremity: Hemoglobin stable at 9 continue Eliquis.  Right hand swelling: She was started empirically on Ancef and colchicine.   She has remained afebrile, her leukocytosis is slowly improved. She has completed her course of antibiotics. Procalcitonin was less than 0.1.  Hypovolemic hyponatremia Resolved with IV fluid hydration.  Anemia of chronic disease: Likely due to iron deficiency, continue iron supplementation.  Essential hypertension Well-controlled.  Urinary retention: She needs to have a  voiding trial today we will discontinue Foley.     DVT prophylaxis: Eliquis Family Communication:Sister Status is: Inpatient  Remains inpatient appropriate because:Unsafe d/c plan   Dispo: The patient is from: Home              Anticipated d/c is to: SNF              Anticipated d/c date is: 1 day              Patient currently is medically stable to d/c.        Code Status:     Code Status Orders  (From admission, onward)         Start     Ordered   11/06/19 1854  Full code  Continuous        11/06/19 1855        Code Status History    Date Active Date Inactive Code Status Order ID Comments User Context   08/26/2019 1020 08/29/2019 2331 Full Code [id number]  Norman Herrlich Inpatient   09/18/2014 1543 09/26/2014 2022 Full Code [id number]  Erby Pian, NP ED   Advance Care Planning Activity        IV Access:    Peripheral IV   Procedures and diagnostic studies:   No results found.   Medical Consultants:    None.  Anti-Infectives:   none  Subjective:    [place] patient relates she does not want her Foley discontinued.  Objective:    Vitals:   11/14/19 0016 11/14/19 0315 11/14/19 0744 11/14/19 0800  BP: 120/71 123/71  114/62  Pulse: 89 100  81  Resp: 16  15  12  Temp: 98.5 F (36.9 C) 98.2 F (36.8 C)  98.3 F (36.8 C)  TempSrc: Oral Oral  Oral  SpO2: 96% 94% 96% 97%  Weight:      Height:       SpO2: 97 %   Intake/Output Summary (Last 24 hours) at 11/14/2019 0859 Last data filed at 11/14/2019 0092 Gross per 24 hour  Intake 360 ml  Output 2000 ml  Net -1640 ml   Filed Weights   11/06/19 1206 11/06/19 1909  Weight: 79.8 kg 79.4 kg    Exam: General exam: In no acute distress. Respiratory system: Good air movement and clear to auscultation. Cardiovascular system: S1 & S2 heard, RRR. No JVD. Gastrointestinal system: Abdomen is nondistended, soft and nontender.  Extremities: No pedal edema. Skin: No rashes,  lesions or ulcers Psychiatry: Judgement and insight appear normal. Mood & affect appropriate.    Data Reviewed:    Labs: Basic Metabolic Panel: Recent Labs  Lab 11/08/19 0328 11/08/19 0328 11/09/19 0449 11/09/19 0449 11/11/19 0947 11/12/19 0612  NA 132*  --  134*  --  138 135  K 3.4*   < > 3.7   < > 2.8* 4.2  CL 96*  --  98  --  100 100  CO2 26  --  25  --  26 26  GLUCOSE 124*  --  105*  --  109* 96  BUN 6*  --  8  --  12 11  CREATININE 0.48  --  0.44  --  0.54 0.45  CALCIUM 7.9*  --  8.5*  --  8.7* 8.6*  MG  --   --   --   --  1.7 2.1   < > = values in this interval not displayed.   GFR Estimated Creatinine Clearance: 54.5 mL/min (by C-G formula based on SCr of 0.45 mg/dL). Liver Function Tests: No results for input(s): AST, ALT, ALKPHOS, BILITOT, PROT, ALBUMIN in the last 168 hours. No results for input(s): LIPASE, AMYLASE in the last 168 hours. No results for input(s): AMMONIA in the last 168 hours. Coagulation profile No results for input(s): INR, PROTIME in the last 168 hours. COVID-19 Labs  No results for input(s): DDIMER, FERRITIN, LDH, CRP in the last 72 hours.  Lab Results  Component Value Date   SARSCOV2NAA NEGATIVE 11/06/2019   [place] NEGATIVE 08/22/2019    CBC: Recent Labs  Lab 11/08/19 0328 11/09/19 0449 11/12/19 0612  WBC 10.2 8.2 6.7  HGB 9.5* 10.3* 10.6*  HCT 29.1* 31.7* 33.6*  MCV 89.5 90.1 92.3  PLT 296 333 439*   Cardiac Enzymes: No results for input(s): CKTOTAL, CKMB, CKMBINDEX, TROPONINI in the last 168 hours. BNP (last 3 results) No results for input(s): PROBNP in the last 8760 hours. CBG: No results for input(s): GLUCAP in the last 168 hours. D-Dimer: No results for input(s): DDIMER in the last 72 hours. Hgb A1c: No results for input(s): HGBA1C in the last 72 hours. Lipid Profile: No results for input(s): CHOL, HDL, LDLCALC, TRIG, CHOLHDL, LDLDIRECT in the last 72 hours. Thyroid function studies: No results for  input(s): TSH, T4TOTAL, T3FREE, THYROIDAB in the last 72 hours.  Invalid input(s): FREET3 Anemia work up: No results for input(s): VITAMINB12, FOLATE, FERRITIN, TIBC, IRON, RETICCTPCT in the last 72 hours. Sepsis Labs: Recent Labs  Lab 11/08/19 0328 11/09/19 0449 11/12/19 0612  PROCALCITON 0.10 <0.10  --   WBC 10.2 8.2 6.7   Microbiology Recent Results (from the past  240 hour(s))  SARS Coronavirus 2 by RT PCR (hospital order, performed in [facility] hospital lab) Nasopharyngeal Nasopharyngeal Swab     Status: None   Collection Time: 11/06/19 12:21 PM   Specimen: Nasopharyngeal Swab  Result Value Ref Range Status   SARS Coronavirus 2 NEGATIVE NEGATIVE Final    Comment: (NOTE) SARS-CoV-2 target nucleic acids are NOT DETECTED.  The SARS-CoV-2 RNA is generally detectable in upper and lower respiratory specimens during the acute phase of infection. The lowest concentration of SARS-CoV-2 viral copies this assay can detect is 250 copies / mL. A negative result does not preclude SARS-CoV-2 infection and should not be used as the sole basis for treatment or other patient management decisions.  A negative result may occur with improper specimen collection / handling, submission of specimen other than nasopharyngeal swab, presence of viral mutation(s) within the areas targeted by this assay, and inadequate number of viral copies (<250 copies / mL). A negative result must be combined with clinical observations, patient history, and epidemiological information.  Fact Sheet for Patients:   StrictlyIdeas.no  Fact Sheet for Healthcare Providers: BankingDealers.co.za  This test is not yet approved or  cleared by the [place] FDA and has been authorized for detection and/or diagnosis of SARS-CoV-2 by FDA under an Emergency Use Authorization (EUA).  This EUA will remain in effect (meaning this test can be used) for the duration of  the COVID-19 declaration under Section 564(b)(1) of the Act, 21 U.S.C. section 360bbb-3(b)(1), unless the authorization is terminated or revoked sooner.  Performed at [facility], [place] [address], [place], [place] [address]   Urine culture     Status: None   Collection Time: 11/06/19  4:38 PM   Specimen: Urine, Random  Result Value Ref Range Status   Specimen Description URINE, RANDOM  Final   Special Requests NONE  Final   Culture   Final    NO GROWTH Performed at [facility], [place] [address] [place], [place] [address]    Report Status 11/07/2019 FINAL  Final  Culture, blood (routine x 2)     Status: None   Collection Time: 11/06/19  8:37 PM   Specimen: BLOOD  Result Value Ref Range Status   Specimen Description BLOOD RIGHT ANTECUBITAL  Final   Special Requests   Final    BOTTLES DRAWN AEROBIC AND ANAEROBIC Blood Culture adequate volume   Culture   Final    NO GROWTH 5 DAYS Performed at [facility], [place] [address] [place], [place] [address]    Report Status 11/11/2019 FINAL  Final  Culture, blood (routine x 2)     Status: None   Collection Time: 11/06/19  8:40 PM   Specimen: BLOOD RIGHT HAND  Result Value Ref Range Status   Specimen Description BLOOD RIGHT HAND  Final   Special Requests   Final    BOTTLES DRAWN AEROBIC AND ANAEROBIC Blood Culture adequate volume   Culture   Final    NO GROWTH 5 DAYS Performed at [facility], [place] [address], [place], [place] [address]    Report Status 11/11/2019 FINAL  Final     Medications:   . acidophilus  1 capsule Oral Daily  . amitriptyline  100 mg Oral QPM  . apixaban  10 mg Oral BID   Followed by  . [START ON 11/15/2019] apixaban  5 mg Oral BID  . Chlorhexidine Gluconate Cloth  6 each Topical Daily  .  docusate sodium  100 mg Oral BID  . ferrous sulfate  325 mg Oral BID WC  . fluticasone  2 spray Each Nare Daily  . gabapentin  300 mg Oral QHS  . Gerhardt's butt cream   Topical QID  .  loratadine  10 mg Oral Daily  . mometasone-formoterol  2 puff Inhalation BID  . montelukast  10 mg Oral QHS  . multivitamin with minerals  1 tablet Oral Daily  . pantoprazole  40 mg Oral Daily  . polyethylene glycol  17 g Oral BID  . tamsulosin  0.4 mg Oral QPC supper   Continuous Infusions:    LOS: 8 days   Charlynne Cousins  [facility]  11/14/2019, 8:59 AM

## 2019-11-14 NOTE — Progress Notes (Addendum)
Inpatient Rehabilitation Admissions Coordinator  I received call from pt's daughter, Scarlet. States her mother has initiated expedited appeal with [employer] yesterday.Questions were answered. I will notify TOC. There will be no bed available to admit this patient to CIR over the weekend. I will follow up on Monday.  Danne Baxter, RN, MSN Rehab Admissions Coordinator [phone] 11/14/2019 8:51 AM

## 2019-11-15 LAB — COMPREHENSIVE METABOLIC PANEL
ALT: 64 U/L — ABNORMAL HIGH (ref 0–44)
AST: 38 U/L (ref 15–41)
Albumin: 3.1 g/dL — ABNORMAL LOW (ref 3.5–5.0)
Alkaline Phosphatase: 62 U/L (ref 38–126)
Anion gap: 11 (ref 5–15)
BUN: 21 mg/dL (ref 8–23)
CO2: 26 mmol/L (ref 22–32)
Calcium: 9.4 mg/dL (ref 8.9–10.3)
Chloride: 98 mmol/L (ref 98–111)
Creatinine, Ser: 0.63 mg/dL (ref 0.44–1.00)
GFR calc Af Amer: >60 mL/min (ref >60)
GFR calc non Af Amer: >60 mL/min (ref >60)
Glucose, Bld: 108 mg/dL — ABNORMAL HIGH (ref 70–99)
Potassium: 3.1 mmol/L — ABNORMAL LOW (ref 3.5–5.1)
Sodium: 135 mmol/L (ref 135–145)
Total Bilirubin: <0.1 mg/dL — ABNORMAL LOW (ref 0.3–1.2)
Total Protein: 6.7 g/dL (ref 6.5–8.1)

## 2019-11-15 LAB — CBC WITH DIFFERENTIAL/PLATELET
Abs Immature Granulocytes: 0.23 10*3/uL — ABNORMAL HIGH (ref 0.00–0.07)
Basophils Absolute: 0.1 10*3/uL (ref 0.0–0.1)
Basophils Relative: 1 %
Eosinophils Absolute: 0.2 10*3/uL (ref 0.0–0.5)
Eosinophils Relative: 2 %
HCT: 38.7 % (ref 36.0–46.0)
Hemoglobin: 12.1 g/dL (ref 12.0–15.0)
Immature Granulocytes: 3 %
Lymphocytes Relative: 23 %
Lymphs Abs: 2 10*3/uL (ref 0.7–4.0)
MCH: 29 pg (ref 26.0–34.0)
MCHC: 31.3 g/dL (ref 30.0–36.0)
MCV: 92.8 fL (ref 80.0–100.0)
Monocytes Absolute: 0.7 10*3/uL (ref 0.1–1.0)
Monocytes Relative: 8 %
Neutro Abs: 5.5 10*3/uL (ref 1.7–7.7)
Neutrophils Relative %: 63 %
Platelets: 487 10*3/uL — ABNORMAL HIGH (ref 150–400)
RBC: 4.17 MIL/uL (ref 3.87–5.11)
RDW: 15.4 % (ref 11.5–15.5)
WBC: 8.7 10*3/uL (ref 4.0–10.5)
nRBC: 0 % (ref 0.0–0.2)

## 2019-11-15 LAB — PHOSPHORUS: Phosphorus: 3.6 mg/dL (ref 2.5–4.6)

## 2019-11-15 LAB — MAGNESIUM: Magnesium: 1.8 mg/dL (ref 1.7–2.4)

## 2019-11-15 NOTE — Progress Notes (Signed)
PROGRESS NOTE    Alexis Olson  WEX:[id number] DOB: 21-Jun-1933 DOA: 11/06/2019 PCP: Mosie Lukes, MD     Brief Narrative:   86 y.o. WF PMHx HTN, GERD, diverticulosis, osteoporosis, B12 deficiency, HLD, bariatric surgery, anxiety disorder, frequently recurrent UTIs, and chronic low back pain   Who suffered an accidental fall at home several days prior to this presentation resulting in pain in the left foot and ankle.  Since that time the patient's family has noted she has progressively become more confused and lethargic.  She presented to the ER with the family suspecting she had another of her recurrent UTIs.  In the ED an x-ray of the ankle revealed no evidence of a fracture but venous duplex of the lower extremity did reveal an acute DVT of the left lower extremity.  CT angio was negative for PE or pulmonary infiltrate.  CT head and cervical spine were without acute findings.  During her stay in the ER the patient has had some urinary retention and required an I&O cath.  She has also been noted to have a low-grade elevated temperature at 100.8 rectal.   Subjective: A/O x4, negative CP, negative S OB, states has been ambulating with PT, and has been in to the chair daily.  Understands that CIR has denied her admission and is awaiting appeal.  States ABSOLUTELY does not want to be considered for SNF.   Assessment & Plan: Covid vaccination; vaccinated   Active Problems:   Essential hypertension   Anemia of chronic disease   Hypokalemia   Hyponatremia   Toxic metabolic encephalopathy   Late effects of accidental fall   DVT (deep venous thrombosis) (HCC)   Hypomagnesemia   Swelling of right hand   Acute urinary retention   Acute metabolic encephalopathy: -Likely due to polypharmacy. -Resolved.  Accidental fall at home: -CT scan of the head and C-spine showed no acute findings. -Physical therapy evaluated the patient recommended skilled nursing facility.  But family would  like to pursue CIR, insurance has denied patient for inpatient rehab.   -Daughter is appealing to [employer] as she is saying that her mother should go to CIR. Awaiting for TOC further recommendations.  Decision will not happen over the weekend per [facility] note.  GERD: Stable.  LEFT leg acute DVT: Lab Results  Component Value Date   HGB 12.1 11/15/2019   HGB 10.6 (L) 11/12/2019   HGB 10.3 (L) 11/09/2019   HGB 9.5 (L) 11/08/2019   HGB 9.8 (L) 11/07/2019  -Hemoglobin stable -Continue Eliquis  Right hand swelling: -She was started empirically on Ancef and colchicine.   -She has remained afebrile, her leukocytosis is slowly improved. -Completed course of antibiotics . -Procalcitonin was less than 0.1.  Hypovolemic hyponatremia -Resolved. -Continue to monitor.  Anemia of chronic disease: -Likely due to iron deficiency, continue iron supplementation. -Anemia panel pending  Essential hypertension Well-controlled.  Urinary retention: -She needs to have a voiding trial today we will discontinue Foley.    DVT prophylaxis: Eliquis Code Status: Full Family Communication:  Status is: Inpatient    Dispo: The patient is from: Home              Anticipated d/c is to: CIR vs home              Anticipated d/c date is: Patient has challenged CIR finding of not being eligible for admission              Patient currently  unstable      Consultants:    Procedures/Significant Events:    I have personally reviewed and interpreted all radiology studies and my findings are as above.  VENTILATOR SETTINGS:    Cultures   Antimicrobials:    Devices    LINES / TUBES:      Continuous Infusions:   Objective: Vitals:   11/15/19 0007 11/15/19 0606 11/15/19 0609 11/15/19 0900  BP: 111/67 (!) 111/54  130/70  Pulse: 90 86  99  Resp: 15 16  14   Temp: 98 F (36.7 C) 98.1 F (36.7 C) 98.1 F (36.7 C) 98.1 F (36.7 C)  TempSrc: Oral Oral   Oral  SpO2: 94% 93%  99%  Weight:      Height:        Intake/Output Summary (Last 24 hours) at 11/15/2019 7026 Last data filed at 11/15/2019 3785 Gross per 24 hour  Intake 222 ml  Output 2600 ml  Net -2378 ml   Filed Weights   11/06/19 1206 11/06/19 1909  Weight: 79.8 kg 79.4 kg    Examination:  General: A/O x4, No acute respiratory distress Eyes: negative scleral hemorrhage, negative anisocoria, negative icterus ENT: Negative Runny nose, negative gingival bleeding, Neck:  Negative scars, masses, torticollis, lymphadenopathy, JVD Lungs: Clear to auscultation bilaterally without wheezes or crackles Cardiovascular: Regular rate and rhythm without murmur gallop or rub normal S1 and S2 Abdomen: negative abdominal pain, nondistended, positive soft, bowel sounds, no rebound, no ascites, no appreciable mass Extremities: LEFT lower extremity in a splint, able to wiggle toes toes warm to touch did not remove splint. Skin: Negative rashes, lesions, ulcers Psychiatric:  Negative depression, negative anxiety, negative fatigue, negative mania  Central nervous system:  Cranial nerves II through XII intact, tongue/uvula midline, all extremities muscle strength 5/5, sensation intact throughout,  negative dysarthria, negative expressive aphasia, negative receptive aphasia.  .     Data Reviewed: Care during the described time interval was provided by me .  I have reviewed this patient's available data, including medical history, events of note, physical examination, and all test results as part of my evaluation.  CBC: Recent Labs  Lab 11/09/19 0449 11/12/19 0612  WBC 8.2 6.7  HGB 10.3* 10.6*  HCT 31.7* 33.6*  MCV 90.1 92.3  PLT 333 885*   Basic Metabolic Panel: Recent Labs  Lab 11/09/19 0449 11/11/19 0947 11/12/19 0612  NA 134* 138 135  K 3.7 2.8* 4.2  CL 98 100 100  CO2 25 26 26   GLUCOSE 105* 109* 96  BUN 8 12 11   CREATININE 0.44 0.54 0.45  CALCIUM 8.5* 8.7* 8.6*  MG  --   1.7 2.1   GFR: Estimated Creatinine Clearance: 54.5 mL/min (by C-G formula based on SCr of 0.45 mg/dL). Liver Function Tests: No results for input(s): AST, ALT, ALKPHOS, BILITOT, PROT, ALBUMIN in the last 168 hours. No results for input(s): LIPASE, AMYLASE in the last 168 hours. No results for input(s): AMMONIA in the last 168 hours. Coagulation Profile: No results for input(s): INR, PROTIME in the last 168 hours. Cardiac Enzymes: No results for input(s): CKTOTAL, CKMB, CKMBINDEX, TROPONINI in the last 168 hours. BNP (last 3 results) No results for input(s): PROBNP in the last 8760 hours. HbA1C: No results for input(s): HGBA1C in the last 72 hours. CBG: No results for input(s): GLUCAP in the last 168 hours. Lipid Profile: No results for input(s): CHOL, HDL, LDLCALC, TRIG, CHOLHDL, LDLDIRECT in the last 72 hours. Thyroid Function Tests: No results for  input(s): TSH, T4TOTAL, FREET4, T3FREE, THYROIDAB in the last 72 hours. Anemia Panel: No results for input(s): VITAMINB12, FOLATE, FERRITIN, TIBC, IRON, RETICCTPCT in the last 72 hours. Sepsis Labs: Recent Labs  Lab 11/09/19 0449  PROCALCITON <0.10    Recent Results (from the past 240 hour(s))  SARS Coronavirus 2 by RT PCR (hospital order, performed in [facility] hospital lab) Nasopharyngeal Nasopharyngeal Swab     Status: None   Collection Time: 11/06/19 12:21 PM   Specimen: Nasopharyngeal Swab  Result Value Ref Range Status   SARS Coronavirus 2 NEGATIVE NEGATIVE Final    Comment: (NOTE) SARS-CoV-2 target nucleic acids are NOT DETECTED.  The SARS-CoV-2 RNA is generally detectable in upper and lower respiratory specimens during the acute phase of infection. The lowest concentration of SARS-CoV-2 viral copies this assay can detect is 250 copies / mL. A negative result does not preclude SARS-CoV-2 infection and should not be used as the sole basis for treatment or other patient management decisions.  A negative result may  occur with improper specimen collection / handling, submission of specimen other than nasopharyngeal swab, presence of viral mutation(s) within the areas targeted by this assay, and inadequate number of viral copies (<250 copies / mL). A negative result must be combined with clinical observations, patient history, and epidemiological information.  Fact Sheet for Patients:   StrictlyIdeas.no  Fact Sheet for Healthcare Providers: BankingDealers.co.za  This test is not yet approved or  cleared by the [place] FDA and has been authorized for detection and/or diagnosis of SARS-CoV-2 by FDA under an Emergency Use Authorization (EUA).  This EUA will remain in effect (meaning this test can be used) for the duration of the COVID-19 declaration under Section 564(b)(1) of the Act, 21 U.S.C. section 360bbb-3(b)(1), unless the authorization is terminated or revoked sooner.  Performed at [facility], [place] [address], [place], [place] [address]   Urine culture     Status: None   Collection Time: 11/06/19  4:38 PM   Specimen: Urine, Random  Result Value Ref Range Status   Specimen Description URINE, RANDOM  Final   Special Requests NONE  Final   Culture   Final    NO GROWTH Performed at [facility], [place] [address] [place], [place] [address]    Report Status 11/07/2019 FINAL  Final  Culture, blood (routine x 2)     Status: None   Collection Time: 11/06/19  8:37 PM   Specimen: BLOOD  Result Value Ref Range Status   Specimen Description BLOOD RIGHT ANTECUBITAL  Final   Special Requests   Final    BOTTLES DRAWN AEROBIC AND ANAEROBIC Blood Culture adequate volume   Culture   Final    NO GROWTH 5 DAYS Performed at [facility], [place] [address], [place], [place] [address]    Report Status 11/11/2019 FINAL  Final  Culture, blood (routine x 2)     Status: None   Collection Time: 11/06/19  8:40 PM   Specimen: BLOOD RIGHT  HAND  Result Value Ref Range Status   Specimen Description BLOOD RIGHT HAND  Final   Special Requests   Final    BOTTLES DRAWN AEROBIC AND ANAEROBIC Blood Culture adequate volume   Culture   Final    NO GROWTH 5 DAYS Performed at [facility], [place] [address] [place], [place] [address]    Report Status 11/11/2019 FINAL  Final         Radiology  Studies: No results found.      Scheduled Meds: . acidophilus  1 capsule Oral Daily  . amitriptyline  100 mg Oral QPM  . apixaban  5 mg Oral BID  . Chlorhexidine Gluconate Cloth  6 each Topical Daily  . docusate sodium  100 mg Oral BID  . ferrous sulfate  325 mg Oral BID WC  . fluticasone  2 spray Each Nare Daily  . gabapentin  300 mg Oral QHS  . Gerhardt's butt cream   Topical QID  . loratadine  10 mg Oral Daily  . mometasone-formoterol  2 puff Inhalation BID  . montelukast  10 mg Oral QHS  . multivitamin with minerals  1 tablet Oral Daily  . pantoprazole  40 mg Oral Daily  . polyethylene glycol  17 g Oral BID  . tamsulosin  0.4 mg Oral QPC supper   Continuous Infusions:   LOS: 9 days    Time spent:40 min    Tyah Acord, Geraldo Docker, MD [facility] Pager [phone]  If 7PM-7AM, please contact night-coverage [email or web address] Password [facility] 11/15/2019, 9:53 AM

## 2019-11-16 DIAGNOSIS — I82402 Acute embolism and thrombosis of unspecified deep veins of left lower extremity: Secondary | ICD-10-CM

## 2019-11-16 DIAGNOSIS — R338 Other retention of urine: Secondary | ICD-10-CM

## 2019-11-16 DIAGNOSIS — W19XXXS Unspecified fall, sequela: Secondary | ICD-10-CM

## 2019-11-16 LAB — RETICULOCYTES
Immature Retic Fract: 21.8 % — ABNORMAL HIGH (ref 2.3–15.9)
RBC.: 4.04 MIL/uL (ref 3.87–5.11)
Retic Count, Absolute: 115.9 10*3/uL (ref 19.0–186.0)
Retic Ct Pct: 2.9 % (ref 0.4–3.1)

## 2019-11-16 LAB — CBC WITH DIFFERENTIAL/PLATELET
Abs Immature Granulocytes: 0.23 10*3/uL — ABNORMAL HIGH (ref 0.00–0.07)
Basophils Absolute: 0.1 10*3/uL (ref 0.0–0.1)
Basophils Relative: 1 %
Eosinophils Absolute: 0.2 10*3/uL (ref 0.0–0.5)
Eosinophils Relative: 2 %
HCT: 34.6 % — ABNORMAL LOW (ref 36.0–46.0)
Hemoglobin: 11 g/dL — ABNORMAL LOW (ref 12.0–15.0)
Immature Granulocytes: 2 %
Lymphocytes Relative: 26 %
Lymphs Abs: 3.1 10*3/uL (ref 0.7–4.0)
MCH: 29.3 pg (ref 26.0–34.0)
MCHC: 31.8 g/dL (ref 30.0–36.0)
MCV: 92.3 fL (ref 80.0–100.0)
Monocytes Absolute: 1 10*3/uL (ref 0.1–1.0)
Monocytes Relative: 8 %
Neutro Abs: 7.4 10*3/uL (ref 1.7–7.7)
Neutrophils Relative %: 61 %
Platelets: 474 10*3/uL — ABNORMAL HIGH (ref 150–400)
RBC: 3.75 MIL/uL — ABNORMAL LOW (ref 3.87–5.11)
RDW: 15.6 % — ABNORMAL HIGH (ref 11.5–15.5)
WBC: 12 10*3/uL — ABNORMAL HIGH (ref 4.0–10.5)
nRBC: 0 % (ref 0.0–0.2)

## 2019-11-16 LAB — COMPREHENSIVE METABOLIC PANEL
ALT: 57 U/L — ABNORMAL HIGH (ref 0–44)
AST: 33 U/L (ref 15–41)
Albumin: 2.9 g/dL — ABNORMAL LOW (ref 3.5–5.0)
Alkaline Phosphatase: 54 U/L (ref 38–126)
Anion gap: 12 (ref 5–15)
BUN: 23 mg/dL (ref 8–23)
CO2: 23 mmol/L (ref 22–32)
Calcium: 9.2 mg/dL (ref 8.9–10.3)
Chloride: 99 mmol/L (ref 98–111)
Creatinine, Ser: 0.56 mg/dL (ref 0.44–1.00)
GFR calc Af Amer: >60 mL/min (ref >60)
GFR calc non Af Amer: >60 mL/min (ref >60)
Glucose, Bld: 100 mg/dL — ABNORMAL HIGH (ref 70–99)
Potassium: 3.7 mmol/L (ref 3.5–5.1)
Sodium: 134 mmol/L — ABNORMAL LOW (ref 135–145)
Total Bilirubin: 0.2 mg/dL — ABNORMAL LOW (ref 0.3–1.2)
Total Protein: 6 g/dL — ABNORMAL LOW (ref 6.5–8.1)

## 2019-11-16 LAB — FOLATE: Folate: 24.8 ng/mL (ref >5.9)

## 2019-11-16 LAB — FERRITIN: Ferritin: 492 ng/mL — ABNORMAL HIGH (ref 11–307)

## 2019-11-16 LAB — IRON AND TIBC
Iron: 37 ug/dL (ref 28–170)
Saturation Ratios: 12 % (ref 10.4–31.8)
TIBC: 307 ug/dL (ref 250–450)
UIBC: 270 ug/dL

## 2019-11-16 LAB — PHOSPHORUS: Phosphorus: 4 mg/dL (ref 2.5–4.6)

## 2019-11-16 LAB — VITAMIN B12: Vitamin B-12: 295 pg/mL (ref 180–914)

## 2019-11-16 LAB — MAGNESIUM: Magnesium: 1.6 mg/dL — ABNORMAL LOW (ref 1.7–2.4)

## 2019-11-16 LAB — LACTATE DEHYDROGENASE: LDH: 124 U/L (ref 98–192)

## 2019-11-16 MED ORDER — APIXABAN 5 MG PO TABS
5.0000 mg | ORAL_TABLET | Freq: Two times a day (BID) | ORAL | 0 refills | Status: DC
Start: 2019-11-16 — End: 2019-12-18

## 2019-11-16 MED ORDER — TAMSULOSIN HCL 0.4 MG PO CAPS: 0.4000 mg | ORAL_CAPSULE | Freq: Every day | ORAL | 0 refills | Status: DC

## 2019-11-16 MED ORDER — FERROUS SULFATE 325 (65 FE) MG PO TABS
325.0000 mg | ORAL_TABLET | Freq: Two times a day (BID) | ORAL | 0 refills | Status: DC
Start: 2019-11-16 — End: 2020-07-10

## 2019-11-16 NOTE — Progress Notes (Signed)
Occupational Therapy Treatment Patient Details Name: Alexis Olson MRN: [id number] DOB: 08-26-1933 Today's Date: 11/16/2019    History of present illness Pt is an 86 y.o. female admitted 11/06/19 after fall at home several days prior, pt with AMS/lethargy and c/o L foot and R hand pain. Pt with acute LLE DVT. L foot imaging negative for acute injury, diffuse soft tissue swelling. 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 Workup for toxic metabolic encephalopathy secondary to polypharmacy. Other PMH includes HTN, HLD, recurrent UTI, anxiety.   OT comments  Pt. Seen for skilled OT session.  Completed bed mobility s. Lb dressing with min a.  Greater difficulty with sit/stand requiring max a and 3 attempts get into full standing but once up she was able to initiate steps to the recliner. Reports good family support at home.    Follow Up Recommendations       Equipment Recommendations       Recommendations for Other Services      Precautions / Restrictions Precautions Precautions: Fall;Other (comment)       Mobility Bed Mobility Overal bed mobility: Needs Assistance Bed Mobility: Supine to Sit     Supine to sit: Min assist     General bed mobility comments: able to bring body upright but took a while to scoot to edge of bed. cont. to state "i just dont know what is wrong with this bed" cues to cont. to scoot to edge of bed  Transfers Overall transfer level: Needs assistance Equipment used: Rolling walker (2 wheeled) Transfers: Sit to/from Stand Sit to Stand: Max assist         General transfer comment: heavy max a to transtition into standing. cues to bring chest and head upright, pt. bent forward towards the floor. took 3 attempts to achieve full standing prior to being able to begin steps to recliner    Balance                                           ADL  either performed or assessed with clinical judgement   ADL Overall ADL's : Needs assistance/impaired                     Lower Body Dressing: Minimal assistance;Sitting/lateral leans Lower Body Dressing Details (indicate cue type and reason): assist to don L sock today Toilet Transfer: Moderate assistance;Stand-pivot;RW;Maximal assistance Toilet Transfer Details (indicate cue type and reason): simulated during transfer from eob to recliner-max a for initial sit/stand, then more mod to take steps to recliner with cues for hand placement         Functional mobility during ADLs: Moderate assistance;Maximal assistance General ADL Comments: great difficulty with sit/stand, max a and cues to bring body upright     Vision       Perception     Praxis      Cognition Arousal/Alertness: Awake/alert Behavior During Therapy: WFL for tasks assessed/performed Overall Cognitive Status: Within Functional Limits for tasks assessed                                 General Comments: pt. with some word finding delays. would say several times throughout session "now what am i trying to say give  me a min."        Exercises     Shoulder Instructions       General Comments      Pertinent Vitals/ Pain       Pain Assessment: No/denies pain Pain Location: Reports some discomfort in L foot but no pain Pain Descriptors / Indicators: Sore Pain Intervention(s): Limited activity within patient's tolerance;Monitored during session  Home Living                                          Prior Functioning/Environment              Frequency  Min 2X/week        Progress Toward Goals  OT Goals(current goals can now be found in the care plan section)  Progress towards OT goals: Progressing toward goals     Plan      Co-evaluation                 AM-PAC OT "6 Clicks" Daily Activity     Outcome Measure   Help from another person eating  meals?: A Little Help from another person taking care of personal grooming?: A Little Help from another person toileting, which includes using toliet, bedpan, or urinal?: Total Help from another person bathing (including washing, rinsing, drying)?: A Lot Help from another person to put on and taking off regular upper body clothing?: A Lot Help from another person to put on and taking off regular lower body clothing?: A Lot 6 Click Score: 13    End of Session Equipment Utilized During Treatment: Gait belt;Rolling walker  OT Visit Diagnosis: Other abnormalities of gait and mobility (R26.89);Muscle weakness (generalized) (M62.81);Pain Pain - Right/Left: Left Pain - part of body: Leg;Ankle and joints of foot   Activity Tolerance Patient tolerated treatment well   Patient Left in chair;with call bell/phone within reach   Nurse Communication          Time: 1012-1040 OT Time Calculation (min): 28 min  Charges: OT General Charges $OT Visit: 1 Visit OT Treatments $Self Care/Home Management : 23-37 mins  Sonia Baller, COTA/L Acute Rehabilitation [phone]   Janice Coffin 11/16/2019, 11:34 AM

## 2019-11-16 NOTE — Progress Notes (Signed)
Pt/family given discharge instructions, medication lists, follow up appointments, and when to call the doctor.  Pt/family verbalizes understanding.  Pt given eliquis card. Pt and husband to be transported to main entrance for pick up by daughter. Payton Emerald, RN

## 2019-11-16 NOTE — Discharge Summary (Addendum)
Physician Discharge Summary  Alexis Olson HYW:[id number] DOB: March 12, 1934 DOA: 11/06/2019  PCP: Mosie Lukes, MD  Admit date: 11/06/2019 Discharge date: 11/16/2019  Time spent: 35 minutes  Recommendations for Outpatient Follow-up:   Acute metabolic encephalopathy: -Likely due to polypharmacy. -Resolved.  Accidental fall at home: -CT scan of the head and C-spine showed no acute findings. -Physical therapy evaluated the patient recommended skilled nursing facility. But family would like to pursue CIR, insurance has denied patient for inpatient rehab. -Daughter is appealing to [facility] as she is saying that her mother should go to CIR. Awaiting forTOCfurther recommendations.  Decision will not happen over the weekend per [facility] note.  GERD: Stable.  LEFT leg acute DVT: Lab Results  Component Value Date   HGB 11.0 (L) 11/16/2019   HGB 12.1 11/15/2019   HGB 10.6 (L) 11/12/2019   HGB 10.3 (L) 11/09/2019   HGB 9.5 (L) 11/08/2019  -Hemoglobin relatively stable -Follow-up with Dr. Willette Alma in 2 weeks LEFT leg DVT, right hand swelling. -Continue Eliquis  Right hand swelling: -She was started empirically on Ancef and colchicine.  -She has remained afebrile, her leukocytosis is slowly improved. -Completed course of antibiotics . -Procalcitonin was less than 0.1.  Hypovolemic hyponatremia -Resolved. -Continue to monitor.  Anemia of chronic disease: -Likely due to iron deficiency, continue iron supplementation. -Anemia panel consistent with anemia of chronic disease  Essential hypertension Well-controlled.  Urinary retention: -Resolved  Discharge Diagnoses:  Active Problems:   Essential hypertension   Anemia of chronic disease   Hypokalemia   Hyponatremia   Toxic metabolic encephalopathy   Late effects of accidental fall   DVT (deep venous thrombosis) (HCC)   Hypomagnesemia   Swelling of right hand   Acute urinary  retention   Discharge Condition: Stable  Diet recommendation: Regular  Filed Weights   11/06/19 1206 11/06/19 1909  Weight: 79.8 kg 79.4 kg    History of present illness:  86 y.o.WF PMHx HTN, GERD, diverticulosis, osteoporosis, B12 deficiency, HLD, bariatric surgery, anxiety disorder,frequently recurrent UTIs,and chronic low back pain   Who suffered an accidental fall at home several days prior to this presentation resulting in pain in the left foot and ankle. Since that time the patient's family has noted she has progressively become more confused and lethargic. She presented to the ER with the family suspecting she had another of her recurrent UTIs.  In the EDanx-ray of the ankle revealed no evidence of a fracture but venous duplex of the lower extremity did reveal an acute DVT of the left lower extremity. CT angio was negative for PE or pulmonary infiltrate. CT head and cervical spine were without acute findings. During her stay in the ER the patient has had some urinary retention and required an I&O cath. She has also been noted to have a low-grade elevated temperature at 100.8 rectal.  Hospital Course:  See above   Procedures:  *Consultations: [facility] Dr. Dayna Barker  Cultures   8/12 SARS coronavirus negative  Antibiotics Anti-infectives (From admission, onward)   Start     Ordered Stop   11/10/19 1000  cephALEXin (KEFLEX) capsule 500 mg        11/10/19 0902 11/10/19 2242   11/07/19 1700  ceFAZolin (ANCEF) IVPB 1 g/50 mL premix  Status:  Discontinued        11/07/19 1221 11/10/19 0902   11/06/19 1815  vancomycin (VANCOREADY) IVPB 1500 mg/300 mL  Status:  Discontinued  11/06/19 1814 11/06/19 1852   11/06/19 1400  cefTRIAXone (ROCEPHIN) 1 g in sodium chloride 0.9 % 100 mL IVPB        11/06/19 1359 11/06/19 1817       Discharge Exam: Vitals:   11/16/19 0008 11/16/19 0415 11/16/19 0420 11/16/19 0915  BP: 110/69 123/72  117/70  Pulse: 93 (!) 105     Resp: 17 17  15   Temp: 98.2 F (36.8 C) 98.3 F (36.8 C) 98.3 F (36.8 C) 98.2 F (36.8 C)  TempSrc: Oral Oral  Oral  SpO2: 95% 95%  95%  Weight:      Height:        General: A/O x4, No acute respiratory distress Eyes: negative scleral hemorrhage, negative anisocoria, negative icterus ENT: Negative Runny nose, negative gingival bleeding, Neck:  Negative scars, masses, torticollis, lymphadenopathy, JVD Lungs: Clear to auscultation bilaterally without wheezes or crackles Cardiovascular: Regular rate and rhythm without murmur gallop or rub normal S1 and S2 Abdomen: negative abdominal pain, nondistended, positive soft, bowel sounds, no rebound, no ascites, no appreciable mass Extremities: LEFT lower extremity in a splint, able to wiggle toes toes warm to touch did not remove splint.  Discharge Instructions   Allergies as of 11/16/2019      Reactions   Ativan [lorazepam] Anaphylaxis, Other (See Comments)   Deathly allergic; "resp rate dropped dropped to 6"   Other Anaphylaxis   Red peppers--choking, also   Azithromycin Diarrhea   Benadryl [diphenhydramine Hcl] Other (See Comments)   Paradoxical response   Ciprofloxacin Hcl Other (See Comments)   Causes yeast infection and refuses to take   Codeine Phosphate Nausea Only   Can tolerate in limited amounts   Hydrocodone Itching, Nausea And Vomiting   Hydrocodone-homatropine Itching, Nausea And Vomiting   Levaquin [levofloxacin In D5w] Other (See Comments)   Muscle soreness   Levofloxacin    Meperidine Hcl Other (See Comments)   Reaction ??   Molds & Smuts Other (See Comments)   unsure   Sulfa Antibiotics Other (See Comments)   Joint pain   Tape Other (See Comments)   No Coban wrap (per the patient)   Tramadol    Ultram [tramadol Hcl] Other (See Comments)   "Can't move joints"      Medication List    STOP taking these medications   diphenoxylate-atropine 2.5-0.025 MG tablet Commonly known as: LOMOTIL   polyethylene  glycol 17 g packet Commonly known as: MIRALAX / GLYCOLAX   rivaroxaban 10 MG Tabs tablet Commonly known as: Xarelto   valACYclovir 500 MG tablet Commonly known as: VALTREX     TAKE these medications   acetaminophen 500 MG tablet Commonly known as: TYLENOL Take 2 tablets (1,000 mg total) by mouth every 8 (eight) hours.   albuterol 108 (90 Base) MCG/ACT inhaler Commonly known as: VENTOLIN HFA Inhale 1-2 puffs into the lungs every 6 (six) hours as needed for wheezing or shortness of breath.   ALPRAZolam 1 MG tablet Commonly known as: XANAX TAKE 1/2 TO 1 TABLET BY MOUTH AT BEDTIME AS NEEDED What changed:   how much to take  when to take this   Amitiza 24 MCG capsule Generic drug: lubiprostone Take 24 mcg by mouth daily as needed for constipation.   amitriptyline 100 MG tablet Commonly known as: ELAVIL Take 1 tablet (100 mg total) by mouth daily.   apixaban 5 MG Tabs tablet Commonly known as: ELIQUIS Take 1 tablet (5 mg total) by mouth 2 (two) times daily.  azelastine 0.05 % ophthalmic solution Commonly known as: OPTIVAR Place 1 drop into both eyes daily as needed (for dry eyes).   benzonatate 100 MG capsule Commonly known as: TESSALON Take 1 capsule (100 mg total) by mouth 3 (three) times daily as needed for cough.   budesonide-formoterol 160-4.5 MCG/ACT inhaler Commonly known as: SYMBICORT Inhale 2 puffs into the lungs See admin instructions. Inhale 2 puffs into the lungs one to two times a day What changed: when to take this   cetirizine 10 MG tablet Commonly known as: ZYRTEC Take 10 mg by mouth daily.   cyanocobalamin 1000 MCG/ML injection Commonly known as: (VITAMIN B-12) Inject 1cc into the muscle every month   dicyclomine 10 MG capsule Commonly known as: BENTYL Take 1 capsule (10 mg total) by mouth 3 (three) times daily as needed (for diarrhea & symptoms).   docusate sodium 100 MG capsule Commonly known as: Colace Take 1 capsule (100 mg total) by  mouth 2 (two) times daily. What changed:   when to take this  reasons to take this   ferrous sulfate 325 (65 FE) MG tablet Take 1 tablet (325 mg total) by mouth 2 (two) times daily with a meal. What changed: when to take this   fluticasone 50 MCG/ACT nasal spray Commonly known as: FLONASE 2 sprays by Nasal route daily.   gabapentin 300 MG capsule Commonly known as: NEURONTIN TAKE 1 CAPSULE BY MOUTH AT BEDTIME   Hemocyte Plus 106-1 MG Caps TAKE 1 CAPSULE BY MOUTH 2 TIMES DAILY. What changed: See the new instructions.   hydrOXYzine 25 MG tablet Commonly known as: ATARAX/VISTARIL Take 25 mg by mouth 3 (three) times daily as needed for itching.   levocetirizine 5 MG tablet Commonly known as: XYZAL Take 1 tablet (5 mg total) by mouth every evening.   Mag-Ox 400 400 MG tablet Generic drug: magnesium oxide Take 400 mg by mouth 2 (two) times daily.   methocarbamol 500 MG tablet Commonly known as: Robaxin Take 1 tablet (500 mg total) by mouth every 6 (six) hours as needed for muscle spasms.   montelukast 10 MG tablet Commonly known as: SINGULAIR Take 10 mg by mouth daily as needed (For allergy).   omeprazole 20 MG capsule Commonly known as: PRILOSEC TAKE 1 CAPSULE BY MOUTH DAILY.   ondansetron 4 MG disintegrating tablet Commonly known as: ZOFRAN-ODT Take 1 tablet (4 mg total) by mouth 3 (three) times daily as needed for nausea or vomiting.   oxyCODONE 5 MG immediate release tablet Commonly known as: Oxy IR/ROXICODONE Take 1-2 tablets (5-10 mg total) by mouth every 4 (four) hours as needed for moderate pain or severe pain.   potassium chloride SA 20 MEQ tablet Commonly known as: KLOR-CON TAKE 1 TABLET BY MOUTH ONCE DAILY   PROBIOTIC DAILY PO Take 1 capsule by mouth daily.   tamsulosin 0.4 MG Caps capsule Commonly known as: FLOMAX Take 1 capsule (0.4 mg total) by mouth daily after supper.   Vitamin D (Ergocalciferol) 1.25 MG (50000 UNIT) Caps capsule Commonly  known as: DRISDOL TAKE 1 CAPSULE (50,000 UNITS TOTAL) BY MOUTH EVERY 7 (SEVEN) DAYS.   zolpidem 5 MG tablet Commonly known as: AMBIEN TAKE 1 TABLET BY MOUTH AT BEDTIME      Allergies  Allergen Reactions  . Ativan [Lorazepam] Anaphylaxis and Other (See Comments)    Deathly allergic; "resp rate dropped dropped to 6"  . Other Anaphylaxis    Red peppers--choking, also  . Azithromycin Diarrhea  . Benadryl [Diphenhydramine  Hcl] Other (See Comments)    Paradoxical response  . Ciprofloxacin Hcl Other (See Comments)    Causes yeast infection and refuses to take  . Codeine Phosphate Nausea Only    Can tolerate in limited amounts  . Hydrocodone Itching and Nausea And Vomiting  . Hydrocodone-Homatropine Itching and Nausea And Vomiting  . Levaquin [Levofloxacin In D5w] Other (See Comments)    Muscle soreness  . Levofloxacin   . Meperidine Hcl Other (See Comments)    Reaction ??  . Molds & Smuts Other (See Comments)    unsure  . Sulfa Antibiotics Other (See Comments)    Joint pain  . Tape Other (See Comments)    No Coban wrap (per the patient)  . Tramadol   . Ultram [Tramadol Hcl] Other (See Comments)    "Can't move joints"    Follow-up Information    Mosie Lukes, MD. Schedule an appointment as soon as possible for a visit in 2 week(s).   Specialty: Family Medicine Why: -Follow-up with Dr. Willette Alma in 2 weeks LEFT leg DVT, right hand swelling Contact information: [place] [address] [phone]                The results of significant diagnostics from this hospitalization (including imaging, microbiology, ancillary and laboratory) are listed below for reference.    Significant Diagnostic Studies: DG Chest 2 View  Result Date: 11/06/2019 CLINICAL DATA:  Altered mental status.  Recent fall. EXAM: CHEST - 2 VIEW COMPARISON:  Chest x-ray dated November 24, 2014. FINDINGS: The heart size and mediastinal contours are within normal  limits. Both lungs are clear. The visualized skeletal structures are unremarkable. IMPRESSION: No active cardiopulmonary disease. Electronically Signed   By: Titus Dubin M.D.   On: 11/06/2019 13:16   DG Shoulder Right  Result Date: 11/06/2019 CLINICAL DATA:  Right shoulder pain.  Recent fall. EXAM: RIGHT SHOULDER - 2+ VIEW COMPARISON:  None. FINDINGS: No acute fracture or dislocation. Moderate acromioclavicular and early glenohumeral osteoarthritis. Soft tissues are unremarkable. IMPRESSION: 1. No acute osseous abnormality.  Degenerative changes. Electronically Signed   By: Titus Dubin M.D.   On: 11/06/2019 13:21   DG Ankle Complete Left  Result Date: 11/06/2019 CLINICAL DATA:  Left ankle pain.  Recent fall. EXAM: LEFT ANKLE COMPLETE - 3+ VIEW COMPARISON:  None. FINDINGS: No acute fracture or dislocation. The ankle mortise is symmetric. The talar dome is intact. No tibiotalar joint effusion. Osteopenia. Diffuse soft tissue swelling. IMPRESSION: 1. Diffuse soft tissue swelling. No acute osseous abnormality. Electronically Signed   By: Titus Dubin M.D.   On: 11/06/2019 13:19   CT Head Wo Contrast  Result Date: 11/06/2019 CLINICAL DATA:  Head trauma, moderate/severe. Neck trauma. Generalized weakness. Fall on Saturday. EXAM: CT HEAD WITHOUT CONTRAST CT CERVICAL SPINE WITHOUT CONTRAST TECHNIQUE: Multidetector CT imaging of the head and cervical spine was performed following the standard protocol without intravenous contrast. Multiplanar CT image reconstructions of the cervical spine were also generated. COMPARISON:  No pertinent prior exams are available for comparison. FINDINGS: CT HEAD FINDINGS Brain: Mild generalized parenchymal atrophy. There is no acute intracranial hemorrhage. No demarcated cortical infarct. No extra-axial fluid collection. No evidence of intracranial mass. No midline shift. Vascular: No hyperdense vessel. Atherosclerotic calcifications. Skull: Normal. Negative for fracture  or focal lesion. Sinuses/Orbits: Visualized orbits show no acute finding. Mild ethmoid and right max sinus mucosal thickening. Partially imaged left maxillary sinus mucous retention cyst. No significant  mastoid effusion CT CERVICAL SPINE FINDINGS Alignment: Straightening of the expected cervical lordosis. Trace C3-C4 and C4-C5 grade 1 anterolisthesis. Rightward rotation of C1 upon C2 may be related to patient head positioning at the time of examination. Skull base and vertebrae: The basion-dental and atlanto-dental intervals are maintained.No evidence of acute fracture to the cervical spine. The bones are diffusely demineralized. Soft tissues and spinal canal: No prevertebral fluid or swelling. No visible canal hematoma. Disc levels: Cervical spondylosis with multilevel disc space narrowing, posterior disc osteophytes, uncovertebral and facet hypertrophy. Disc space narrowing is advanced throughout the cervical spine. No high-grade bony spinal canal stenosis. Multilevel neural foraminal narrowing. Also of note, there is ligamentous hypertrophy/pannus formation posterior to the dens. Upper chest: No consolidation within the imaged lung apices. No visible pneumothorax. IMPRESSION: CT head: 1. No evidence of acute intracranial abnormality. 2. Mild generalized parenchymal atrophy. 3. Mild ethmoid and right maxillary sinus mucosal thickening at the imaged levels. Partially imaged left maxillary sinus mucous retention cyst. CT cervical spine: 1. No evidence of acute fracture to the cervical spine. 2. Rightward rotation of C1 upon C2 may be related to patient head positioning at the time of examination. However, clinical correlation is recommended. 3. Mild C3-C4 and C4-C5 grade 1 anterolisthesis. 4. Cervical spondylosis as described. 5. Diffuse osseous demineralization. Electronically Signed   By: Kellie Simmering DO   On: 11/06/2019 14:51   CT Angio Chest PE W and/or Wo Contrast  Result Date: 11/06/2019 CLINICAL DATA:   Generalized weakness EXAM: CT ANGIOGRAPHY CHEST WITH CONTRAST TECHNIQUE: Multidetector CT imaging of the chest was performed using the standard protocol during bolus administration of intravenous contrast. Multiplanar CT image reconstructions and MIPs were obtained to evaluate the vascular anatomy. CONTRAST:  31mL OMNIPAQUE IOHEXOL 350 MG/ML SOLN COMPARISON:  2016 FINDINGS: Cardiovascular: Satisfactory opacification of the pulmonary arteries to the proximal segmental level. No evidence of acute pulmonary embolism. Normal heart size. No pericardial effusion. Coronary artery and thoracic aortic calcification. Mediastinum/Nodes: No enlarged lymph nodes identified. The thyroid is unremarkable Lungs/Pleura: Bilateral dependent atelectatic changes. No pleural effusion or pneumothorax. Upper Abdomen: No acute abnormality. Musculoskeletal: No acute osseous abnormality. Chronic loss of height at the inferior endplate of T7. Review of the MIP images confirms the above findings. IMPRESSION: No evidence of acute pulmonary embolism or other acute abnormality. Electronically Signed   By: Macy Mis M.D.   On: 11/06/2019 15:03   CT Cervical Spine Wo Contrast  Result Date: 11/06/2019 CLINICAL DATA:  Head trauma, moderate/severe. Neck trauma. Generalized weakness. Fall on Saturday. EXAM: CT HEAD WITHOUT CONTRAST CT CERVICAL SPINE WITHOUT CONTRAST TECHNIQUE: Multidetector CT imaging of the head and cervical spine was performed following the standard protocol without intravenous contrast. Multiplanar CT image reconstructions of the cervical spine were also generated. COMPARISON:  No pertinent prior exams are available for comparison. FINDINGS: CT HEAD FINDINGS Brain: Mild generalized parenchymal atrophy. There is no acute intracranial hemorrhage. No demarcated cortical infarct. No extra-axial fluid collection. No evidence of intracranial mass. No midline shift. Vascular: No hyperdense vessel. Atherosclerotic calcifications.  Skull: Normal. Negative for fracture or focal lesion. Sinuses/Orbits: Visualized orbits show no acute finding. Mild ethmoid and right max sinus mucosal thickening. Partially imaged left maxillary sinus mucous retention cyst. No significant mastoid effusion CT CERVICAL SPINE FINDINGS Alignment: Straightening of the expected cervical lordosis. Trace C3-C4 and C4-C5 grade 1 anterolisthesis. Rightward rotation of C1 upon C2 may be related to patient head positioning at the time of examination. Skull base and vertebrae:  The basion-dental and atlanto-dental intervals are maintained.No evidence of acute fracture to the cervical spine. The bones are diffusely demineralized. Soft tissues and spinal canal: No prevertebral fluid or swelling. No visible canal hematoma. Disc levels: Cervical spondylosis with multilevel disc space narrowing, posterior disc osteophytes, uncovertebral and facet hypertrophy. Disc space narrowing is advanced throughout the cervical spine. No high-grade bony spinal canal stenosis. Multilevel neural foraminal narrowing. Also of note, there is ligamentous hypertrophy/pannus formation posterior to the dens. Upper chest: No consolidation within the imaged lung apices. No visible pneumothorax. IMPRESSION: CT head: 1. No evidence of acute intracranial abnormality. 2. Mild generalized parenchymal atrophy. 3. Mild ethmoid and right maxillary sinus mucosal thickening at the imaged levels. Partially imaged left maxillary sinus mucous retention cyst. CT cervical spine: 1. No evidence of acute fracture to the cervical spine. 2. Rightward rotation of C1 upon C2 may be related to patient head positioning at the time of examination. However, clinical correlation is recommended. 3. Mild C3-C4 and C4-C5 grade 1 anterolisthesis. 4. Cervical spondylosis as described. 5. Diffuse osseous demineralization. Electronically Signed   By: Kellie Simmering DO   On: 11/06/2019 14:51   MR HAND RIGHT W WO CONTRAST  Result Date:  11/08/2019 CLINICAL DATA:  Soft tissue hand infection EXAM: MRI OF THE RIGHT HAND WITHOUT AND WITH CONTRAST TECHNIQUE: Multiplanar, multisequence MR imaging of the right hand was performed before and after the administration of intravenous contrast. CONTRAST:  5mL GADAVIST GADOBUTROL 1 MMOL/ML IV SOLN COMPARISON:  None. FINDINGS: Bones/Joint/Cartilage No areas of cortical destruction or periosteal reaction are seen. There is a a lobular heterogeneously enhancing T2 bright lesion within the fourth metacarpal measuring 2 cm with internal areas of stippled calcification and signal dropout, likely enchondroma. There is advanced first [facility] joint osteoarthritis with joint space loss and subchondral cystic changes. There is small cystic changes in the carpals. There is slight flexion deformity seen throughout the D IP joints with joint space loss. There is also osteoarthritis in the first MCP joint with marginal osteophyte formation and subchondral cystic changes. Ligaments The collateral ligaments appear to be intact. Muscles and Tendons Increased feathery signal and mild enhancement seen throughout the muscles of the hand. No loculated fluid collection. There is mildly increased signal seen within the third and fourth flexor digitorum tendons with a small amount of enhancing fluid. The extensor tendons are intact. Soft tissues There is diffuse subcutaneous edema and skin thickening seen surrounding the hand, predominantly on the dorsum. No loculated fluid collections or areas of ulceration are seen. IMPRESSION: Diffuse dorsal subcutaneous edema and increased signal throughout the muscles which could be due to myositis and cellulitis. No loculated fluid collections or evidence of osteomyelitis. Osteoarthritis as described above. Slight flexion deformities and probable heberden's nodules at the D IP joints. Mild third and fourth digit flexor digitorum tenosynovitis. 2 cm cystic lesion within the second metacarpal, likely  enchondroma. Electronically Signed   By: Prudencio Pair M.D.   On: 11/08/2019 16:12   DG Hand Complete Right  Result Date: 11/07/2019 CLINICAL DATA:  Hand swelling EXAM: RIGHT HAND - COMPLETE 3+ VIEW COMPARISON:  None. FINDINGS: Three view radiograph right hand demonstrates a mallet finger deformity of the a index finger and a a possible boutonniere deformity of the right little finger with superior dislocation of the distal interphalangeal joint. There is superimposed advanced degenerative arthritis of the D IP and PIP joints of the digits, the interphalangeal joint of the thumb, the first and second MCP joints, and  the first [facility] joint at the base of the thumb. Mild to moderate degenerative changes are not all so noted at the radiocarpal articulation. No acute fracture. There is marked diffuse soft tissue swelling noted of the right hand. IMPRESSION: 1. Mallet finger deformity of the index finger with possible Boutonniere deformity of the right little finger. Superior dislocation left fifth DIP. 2. 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 3. Diffuse soft tissue swelling.  No definite fracture identified. Electronically Signed   By: Fidela Salisbury MD   On: 11/07/2019 17:14   ECHOCARDIOGRAM COMPLETE  Result Date: 11/07/2019    ECHOCARDIOGRAM REPORT   Patient Name:   Alexis Olson Date of Exam: 11/07/2019 Medical Rec #:  [id number]      Height:       65.0 in Accession #:    [id number]     Weight:       175.0 lb Date of Birth:  [birth year]       BSA:          1.869 m Patient Age:    70 years       BP:           141/75 mmHg Patient Gender: F              HR:           107 bpm. Exam Location:  Inpatient Procedure: 3D Echo and 2D Echo Indications:    Murmur 785.2 / R01.1  History:        Patient has no prior history of Echocardiogram examinations.                 Risk Factors:Hypertension and Dyslipidemia. GERD. Accidental                 fall a few days ago, acute DVT in left.   Sonographer:    Darlina Sicilian RDCS Referring Phys: [place]  1. Left ventricular ejection fraction, by estimation, is 60 to 65%. The left ventricle has normal function. The left ventricle has no regional wall motion abnormalities. Left ventricular diastolic parameters are consistent with Grade I diastolic dysfunction (impaired relaxation).  2. Right ventricular systolic function is normal. The right ventricular size is normal. There is normal pulmonary artery systolic pressure.  3. The mitral valve is normal in structure. No evidence of mitral valve regurgitation. No evidence of mitral stenosis.  4. Tricuspid valve regurgitation is mild to moderate.  5. The aortic valve is normal in structure. Aortic valve regurgitation is not visualized. Mild aortic valve stenosis.  6. The inferior vena cava is normal in size with greater than 50% respiratory variability, suggesting right atrial pressure of 3 mmHg. FINDINGS  Left Ventricle: Left ventricular ejection fraction, by estimation, is 60 to 65%. The left ventricle has normal function. The left ventricle has no regional wall motion abnormalities. The left ventricular internal cavity size was normal in size. There is  no left ventricular hypertrophy. Left ventricular diastolic parameters are consistent with Grade I diastolic dysfunction (impaired relaxation). Normal left ventricular filling pressure. Right Ventricle: The right ventricular size is normal. No increase in right ventricular wall thickness. Right ventricular systolic function is normal. There is normal pulmonary artery systolic pressure. The tricuspid regurgitant velocity is 2.69 m/s, and  with an assumed right atrial pressure of 3 mmHg, the estimated right ventricular systolic pressure is 54.6 mmHg. Left Atrium: Left atrial size was normal in size. Right  Atrium: Right atrial size was normal in size. Pericardium: There is no evidence of pericardial effusion. Mitral Valve: The mitral  valve is normal in structure. Normal mobility of the mitral valve leaflets. Mild to moderate mitral annular calcification. No evidence of mitral valve regurgitation. No evidence of mitral valve stenosis. Tricuspid Valve: The tricuspid valve is normal in structure. Tricuspid valve regurgitation is mild to moderate. No evidence of tricuspid stenosis. Aortic Valve: The aortic valve is normal in structure.. There is mild thickening and mild calcification of the aortic valve. Aortic valve regurgitation is not visualized. Mild aortic stenosis is present. There is mild thickening of the aortic valve. There is mild calcification of the aortic valve. Aortic valve mean gradient measures 12.5 mmHg. Aortic valve peak gradient measures 19.9 mmHg. Aortic valve area, by VTI measures 1.83 cm. Pulmonic Valve: The pulmonic valve was normal in structure. Pulmonic valve regurgitation is not visualized. No evidence of pulmonic stenosis. Aorta: The aortic root is normal in size and structure. Venous: The inferior vena cava is normal in size with greater than 50% respiratory variability, suggesting right atrial pressure of 3 mmHg. IAS/Shunts: There is redundancy of the interatrial septum. No atrial level shunt detected by color flow Doppler.  LEFT VENTRICLE PLAX 2D LVIDd:         4.10 cm  Diastology LVIDs:         2.70 cm  LV e' lateral:   8.05 cm/s LV PW:         1.00 cm  LV E/e' lateral: 8.3 LV IVS:        1.30 cm  LV e' medial:    6.64 cm/s LVOT diam:     2.10 cm  LV E/e' medial:  10.0 LV SV:         76 LV SV Index:   41 LVOT Area:     3.46 cm  RIGHT VENTRICLE RV S prime:     16.10 cm/s TAPSE (M-mode): 2.0 cm LEFT ATRIUM             Index       RIGHT ATRIUM          Index LA diam:        3.20 cm 1.71 cm/m  RA Area:     8.86 cm LA Vol (A2C):   50.7 ml 27.13 ml/m RA Volume:   12.10 ml 6.47 ml/m LA Vol (A4C):   45.8 ml 24.51 ml/m LA Biplane Vol: 52.4 ml 28.04 ml/m  AORTIC VALVE AV Area (Vmax):    1.85 cm AV Area (Vmean):   1.74  cm AV Area (VTI):     1.83 cm AV Vmax:           223.00 cm/s AV Vmean:          166.500 cm/s AV VTI:            0.414 m AV Peak Grad:      19.9 mmHg AV Mean Grad:      12.5 mmHg LVOT Vmax:         119.00 cm/s LVOT Vmean:        83.800 cm/s LVOT VTI:          0.219 m LVOT/AV VTI ratio: 0.53  AORTA Ao Root diam: 3.20 cm MITRAL VALVE                TRICUSPID VALVE MV Area (PHT): 5.38 cm     TR Peak grad:   28.9 mmHg MV  Decel Time: 141 msec     TR Vmax:        269.00 cm/s MV E velocity: 66.50 cm/s MV A velocity: 106.00 cm/s  SHUNTS MV E/A ratio:  0.63         Systemic VTI:  0.22 m                             Systemic Diam: 2.10 cm Dani Gobble Croitoru MD Electronically signed by Sanda Klein MD Signature Date/Time: 11/07/2019/10:28:36 AM    Final    VAS [place] LOWER EXTREMITY VENOUS (DVT) (ONLY [facility] & [facility])  Result Date: 11/07/2019  Lower Venous DVTStudy Indications: Swelling. Other Indications: History of DVT. Comparison Study: 03-08-2017 Prior RT- partial chronic at [facility] Performing Technologist: Darlin Coco  Examination Guidelines: A complete evaluation includes B-mode imaging, spectral Doppler, color Doppler, and power Doppler as needed of all accessible portions of each vessel. Bilateral testing is considered an integral part of a complete examination. Limited examinations for reoccurring indications may be performed as noted. The reflux portion of the exam is performed with the patient in reverse Trendelenburg.  +---------+---------------+---------+-----------+----------+-------------------+ RIGHT    CompressibilityPhasicitySpontaneityPropertiesThrombus Aging      +---------+---------------+---------+-----------+----------+-------------------+ [facility]      Full           Yes      Yes                  Significant fibrin                                                        stranding           +---------+---------------+---------+-----------+----------+-------------------+ SFJ      Full                                                              +---------+---------------+---------+-----------+----------+-------------------+ FV Prox  Full                                                             +---------+---------------+---------+-----------+----------+-------------------+ FV Mid   Full                                                             +---------+---------------+---------+-----------+----------+-------------------+ FV DistalFull                                                             +---------+---------------+---------+-----------+----------+-------------------+ PFV      Full                                                             +---------+---------------+---------+-----------+----------+-------------------+  POP      Full           Yes      Yes                                      +---------+---------------+---------+-----------+----------+-------------------+ PTV      Full                                                             +---------+---------------+---------+-----------+----------+-------------------+ PERO     Full                                                             +---------+---------------+---------+-----------+----------+-------------------+   +---------+---------------+---------+-----------+----------+-------------------+ LEFT     CompressibilityPhasicitySpontaneityPropertiesThrombus Aging      +---------+---------------+---------+-----------+----------+-------------------+ CFV      Full           Yes      Yes                  Significant fibrin                                                        stranding           +---------+---------------+---------+-----------+----------+-------------------+ SFJ      Full                                                             +---------+---------------+---------+-----------+----------+-------------------+ FV Prox  Full                                          Significant fibrin                                                        stranding           +---------+---------------+---------+-----------+----------+-------------------+ FV Mid   Full                                                             +---------+---------------+---------+-----------+----------+-------------------+ FV DistalFull                                                             +---------+---------------+---------+-----------+----------+-------------------+  PFV      Full                                         Fibrin stranding    +---------+---------------+---------+-----------+----------+-------------------+ POP      None           No       No                   Age Indeterminate   +---------+---------------+---------+-----------+----------+-------------------+ PTV      Full                                                             +---------+---------------+---------+-----------+----------+-------------------+ PERO     Full                                                             +---------+---------------+---------+-----------+----------+-------------------+    Summary: RIGHT: - There is no evidence of deep vein thrombosis in the lower extremity.  - No cystic structure found in the popliteal fossa.  LEFT: - Findings consistent with age indeterminate deep vein thrombosis involving the left popliteal vein. - A cystic structure is found in the popliteal fossa.  *See table(s) above for measurements and observations. Electronically signed by Servando Snare MD on 11/07/2019 at 8:12:12 AM.    Final    VAS [place] UPPER EXTREMITY VENOUS DUPLEX  Result Date: 11/08/2019 UPPER VENOUS STUDY  Indications: Swelling Risk Factors: DVT. Comparison Study: No prior studies. Performing Technologist: Oliver Hum RVT  Examination Guidelines: A complete evaluation includes B-mode imaging, spectral Doppler, color Doppler, and power  Doppler as needed of all accessible portions of each vessel. Bilateral testing is considered an integral part of a complete examination. Limited examinations for reoccurring indications may be performed as noted.  Right Findings: +----------+------------+---------+-----------+----------+-------+ RIGHT     CompressiblePhasicitySpontaneousPropertiesSummary +----------+------------+---------+-----------+----------+-------+ IJV           Full       Yes       Yes                      +----------+------------+---------+-----------+----------+-------+ Subclavian    Full       Yes       Yes                      +----------+------------+---------+-----------+----------+-------+ Axillary      Full       Yes       Yes                      +----------+------------+---------+-----------+----------+-------+ Brachial      Full       Yes       Yes                      +----------+------------+---------+-----------+----------+-------+ Radial        Full                                          +----------+------------+---------+-----------+----------+-------+  Ulnar         Full                                          +----------+------------+---------+-----------+----------+-------+ Cephalic      Full                                          +----------+------------+---------+-----------+----------+-------+ Basilic       Full                                          +----------+------------+---------+-----------+----------+-------+  Left Findings: +----------+------------+---------+-----------+----------+-------+ LEFT      CompressiblePhasicitySpontaneousPropertiesSummary +----------+------------+---------+-----------+----------+-------+ Subclavian    Full       Yes       Yes                      +----------+------------+---------+-----------+----------+-------+  Summary:  Right: No evidence of deep vein thrombosis in the upper extremity. No evidence of  superficial vein thrombosis in the upper extremity.  Left: No evidence of thrombosis in the subclavian.  *See table(s) above for measurements and observations.  Diagnosing physician: Ruta Hinds MD Electronically signed by Ruta Hinds MD on 11/08/2019 at 12:11:11 PM.    Final     Microbiology: Recent Results (from the past 240 hour(s))  SARS Coronavirus 2 by RT PCR (hospital order, performed in [facility] hospital lab) Nasopharyngeal Nasopharyngeal Swab     Status: None   Collection Time: 11/06/19 12:21 PM   Specimen: Nasopharyngeal Swab  Result Value Ref Range Status   SARS Coronavirus 2 NEGATIVE NEGATIVE Final    Comment: (NOTE) SARS-CoV-2 target nucleic acids are NOT DETECTED.  The SARS-CoV-2 RNA is generally detectable in upper and lower respiratory specimens during the acute phase of infection. The lowest concentration of SARS-CoV-2 viral copies this assay can detect is 250 copies / mL. A negative result does not preclude SARS-CoV-2 infection and should not be used as the sole basis for treatment or other patient management decisions.  A negative result may occur with improper specimen collection / handling, submission of specimen other than nasopharyngeal swab, presence of viral mutation(s) within the areas targeted by this assay, and inadequate number of viral copies (<250 copies / mL). A negative result must be combined with clinical observations, patient history, and epidemiological information.  Fact Sheet for Patients:   StrictlyIdeas.no  Fact Sheet for Healthcare Providers: BankingDealers.co.za  This test is not yet approved or  cleared by the [place] FDA and has been authorized for detection and/or diagnosis of SARS-CoV-2 by FDA under an Emergency Use Authorization (EUA).  This EUA will remain in effect (meaning this test can be used) for the duration of the COVID-19 declaration under Section 564(b)(1) of the  Act, 21 U.S.C. section 360bbb-3(b)(1), unless the authorization is terminated or revoked sooner.  Performed at [facility], [place] [address] [place], [place] [address]   Urine culture     Status: None   Collection Time: 11/06/19  4:38 PM   Specimen: Urine, Random  Result Value Ref Range Status   Specimen Description URINE, RANDOM  Final   Special Requests NONE  Final  Culture   Final    NO GROWTH Performed at [facility], [place] [address] [place], [place] [address]    Report Status 11/07/2019 FINAL  Final  Culture, blood (routine x 2)     Status: None   Collection Time: 11/06/19  8:37 PM   Specimen: BLOOD  Result Value Ref Range Status   Specimen Description BLOOD RIGHT ANTECUBITAL  Final   Special Requests   Final    BOTTLES DRAWN AEROBIC AND ANAEROBIC Blood Culture adequate volume   Culture   Final    NO GROWTH 5 DAYS Performed at [facility], [place] [address], [place], [place] [address]    Report Status 11/11/2019 FINAL  Final  Culture, blood (routine x 2)     Status: None   Collection Time: 11/06/19  8:40 PM   Specimen: BLOOD RIGHT HAND  Result Value Ref Range Status   Specimen Description BLOOD RIGHT HAND  Final   Special Requests   Final    BOTTLES DRAWN AEROBIC AND ANAEROBIC Blood Culture adequate volume   Culture   Final    NO GROWTH 5 DAYS Performed at [facility], [place] [address] [place], [place] [address]    Report Status 11/11/2019 FINAL  Final     Labs: Basic Metabolic Panel: Recent Labs  Lab 11/11/19 0947 11/12/19 0612 11/15/19 1015 11/16/19 0152  NA 138 135 135 134*  K 2.8* 4.2 3.1* 3.7  CL 100 100 98 99  CO2 26 26 26 23   GLUCOSE 109* 96 108* 100*  BUN 12 11 21 23   CREATININE 0.54 0.45 0.63 0.56  CALCIUM 8.7* 8.6* 9.4 9.2  MG 1.7 2.1 1.8 1.6*  PHOS  --   --  3.6 4.0   Liver Function Tests: Recent Labs  Lab 11/15/19 1015 11/16/19 0152  AST 38 33  ALT 64* 57*  ALKPHOS 62 54  BILITOT <0.1* 0.2*  PROT 6.7 6.0*   ALBUMIN 3.1* 2.9*   No results for input(s): LIPASE, AMYLASE in the last 168 hours. No results for input(s): AMMONIA in the last 168 hours. CBC: Recent Labs  Lab 11/12/19 0612 11/15/19 1015 11/16/19 0152  WBC 6.7 8.7 12.0*  NEUTROABS  --  5.5 7.4  HGB 10.6* 12.1 11.0*  HCT 33.6* 38.7 34.6*  MCV 92.3 92.8 92.3  PLT 439* 487* 474*   Cardiac Enzymes: No results for input(s): CKTOTAL, CKMB, CKMBINDEX, TROPONINI in the last 168 hours. BNP: BNP (last 3 results) No results for input(s): BNP in the last 8760 hours.  ProBNP (last 3 results) No results for input(s): PROBNP in the last 8760 hours.  CBG: No results for input(s): GLUCAP in the last 168 hours.     Signed:  Dia Crawford, MD [facility] [phone] pager

## 2019-11-16 NOTE — TOC Transition Note (Addendum)
Transition of Care (TOC) - CM/SW Discharge Note Marvetta Gibbons RN, BSN Transitions of Care Unit 4E- RN Case Manager See Treatment Team for direct phone #    Patient Details  Name: Alexis Olson MRN: [id number] Date of Birth: [birth year]  Transition of Care [facility]) CM/SW Contact:  Dawayne Patricia, RN Phone Number: 11/16/2019, 12:30 PM   Clinical Narrative:    Received msg from daughter Darrick Grinder on phone VM this AM that insurance has denied INPT rehab on appeal. They are prepared to take patient home with [facility]- call returned to daughter to discuss possible d/c later today. Daughter states patient wants to come home today she would not be in town until later this afternoon to transport- which this Probation officer informed daughter that was fine. Daughter states to f/u with patient for [facility] choice.  Spoke with pt. At bedside for [facility] choice- she would like to use same agency that her spouse did but not sure who that was- thinking it was either [facility] or [facility]- would like CM to check with both of them to see and find out if they can accept referral.  Call made to [place] with [facility]- on checking - they do not have in their system that they ever serviced Mr. Horwitz- and per Malachy Mood at this time they are not in network with [facility]- unable to accept referral.  Call made to [facility] with [facility]- unable to check in their system currently as she is away from her computer- however they are unable to accept managed medicare insurance at this time. - Cassie did call back and checked on service for Mr Lodato - they did service back in 02/2019 Went back to patient to inform her that neither of her choices for [facility] agencies could accept at this time- she elected another two- [facility] and [place].   Call made to [facility] with Alvis Lemmings- who is able to accept referral for HHPT/OT- pt informed and is agreeable to this. The will reach out within 48 hr post discharge for start of care.   Final next level of care: [place] Barriers to Discharge: Barriers Resolved   Patient Goals and CMS Choice Patient states their goals for this hospitalization and ongoing recovery are:: go home CMS Medicare.gov Compare Post Acute Care list provided to:: Patient Choice offered to / list presented to : Patient, Spouse  Discharge Placement               Home with [facility]        Discharge Plan and Services   Discharge Planning Services: CM Consult Post Acute Care Choice: Home Health          DME Arranged: N/A DME Agency: NA       HH Arranged: PT, OT HH Agency: [place] Date [place]: 11/16/19 Time [place]: 67 Representative spoke with at [place]: [place] ([place]) Interventions     Readmission Risk Interventions Readmission Risk Prevention Plan 11/16/2019  Transportation Screening Complete  PCP or Specialist Appt within 5-7 Days Complete  Home Care Screening Complete  Medication Review (RN CM) Complete  Some recent data might be hidden

## 2019-11-17 LAB — HAPTOGLOBIN: Haptoglobin: 284 mg/dL (ref 41–333)

## 2019-11-17 MED FILL — ELIQUIS 5 MG TABLET: 5 | 30 days supply | Qty: 60 | Fill #0

## 2019-11-17 MED FILL — TAMSULOSIN HCL 0.4 MG CAP: 0.4 | 30 days supply | Qty: 30 | Fill #0

## 2019-11-17 MED FILL — oxyCODONE HCL 5 MG TABS: 5 | 15 days supply | Qty: 30 | Fill #0

## 2019-11-18 ENCOUNTER — Telehealth: Payer: Self-pay | Admitting: *Deleted

## 2019-11-18 NOTE — Telephone Encounter (Signed)
OK to use appt on 9/2 for follow up as long as she reports she is doing OK

## 2019-11-18 NOTE — Telephone Encounter (Signed)
Patient declined to see another provider for hospital follow up because she has a follow up appt with you on 11/27/19.  It is a 20 min slot would you be ok with just doing her hospital follow up at that time.

## 2019-11-19 DIAGNOSIS — F419 Anxiety disorder, unspecified: Secondary | ICD-10-CM | POA: Diagnosis not present

## 2019-11-19 DIAGNOSIS — M2578 Osteophyte, vertebrae: Secondary | ICD-10-CM | POA: Diagnosis not present

## 2019-11-19 DIAGNOSIS — M151 Heberden's nodes (with arthropathy): Secondary | ICD-10-CM | POA: Diagnosis not present

## 2019-11-19 DIAGNOSIS — M19031 Primary osteoarthritis, right wrist: Secondary | ICD-10-CM | POA: Diagnosis not present

## 2019-11-19 DIAGNOSIS — M19041 Primary osteoarthritis, right hand: Secondary | ICD-10-CM | POA: Diagnosis not present

## 2019-11-19 DIAGNOSIS — E538 Deficiency of other specified B group vitamins: Secondary | ICD-10-CM | POA: Diagnosis not present

## 2019-11-19 DIAGNOSIS — M4802 Spinal stenosis, cervical region: Secondary | ICD-10-CM | POA: Diagnosis not present

## 2019-11-19 DIAGNOSIS — M47812 Spondylosis without myelopathy or radiculopathy, cervical region: Secondary | ICD-10-CM | POA: Diagnosis not present

## 2019-11-19 DIAGNOSIS — I82402 Acute embolism and thrombosis of unspecified deep veins of left lower extremity: Secondary | ICD-10-CM | POA: Diagnosis not present

## 2019-11-19 DIAGNOSIS — E785 Hyperlipidemia, unspecified: Secondary | ICD-10-CM | POA: Diagnosis not present

## 2019-11-19 DIAGNOSIS — M170 Bilateral primary osteoarthritis of knee: Secondary | ICD-10-CM | POA: Diagnosis not present

## 2019-11-19 DIAGNOSIS — K219 Gastro-esophageal reflux disease without esophagitis: Secondary | ICD-10-CM | POA: Diagnosis not present

## 2019-11-19 DIAGNOSIS — M152 Bouchard's nodes (with arthropathy): Secondary | ICD-10-CM | POA: Diagnosis not present

## 2019-11-19 DIAGNOSIS — I10 Essential (primary) hypertension: Secondary | ICD-10-CM | POA: Diagnosis not present

## 2019-11-19 DIAGNOSIS — D509 Iron deficiency anemia, unspecified: Secondary | ICD-10-CM | POA: Diagnosis not present

## 2019-11-19 DIAGNOSIS — E876 Hypokalemia: Secondary | ICD-10-CM | POA: Diagnosis not present

## 2019-11-19 DIAGNOSIS — M545 Low back pain: Secondary | ICD-10-CM | POA: Diagnosis not present

## 2019-11-19 DIAGNOSIS — M85872 Other specified disorders of bone density and structure, left ankle and foot: Secondary | ICD-10-CM | POA: Diagnosis not present

## 2019-11-19 DIAGNOSIS — M4312 Spondylolisthesis, cervical region: Secondary | ICD-10-CM | POA: Diagnosis not present

## 2019-11-19 DIAGNOSIS — D72829 Elevated white blood cell count, unspecified: Secondary | ICD-10-CM | POA: Diagnosis not present

## 2019-11-19 DIAGNOSIS — K5790 Diverticulosis of intestine, part unspecified, without perforation or abscess without bleeding: Secondary | ICD-10-CM | POA: Diagnosis not present

## 2019-11-19 DIAGNOSIS — M19011 Primary osteoarthritis, right shoulder: Secondary | ICD-10-CM | POA: Diagnosis not present

## 2019-11-19 DIAGNOSIS — I083 Combined rheumatic disorders of mitral, aortic and tricuspid valves: Secondary | ICD-10-CM | POA: Diagnosis not present

## 2019-11-19 DIAGNOSIS — M810 Age-related osteoporosis without current pathological fracture: Secondary | ICD-10-CM | POA: Diagnosis not present

## 2019-11-19 MED FILL — GABAPENTIN 300 MG CAPSULE: 300 | 90 days supply | Qty: 90 | Fill #1

## 2019-11-19 NOTE — Telephone Encounter (Signed)
11/19/19- Pt. Has been notified and states presently recovering and if anything does change, will notify the office, and okay w/provider to be seen on 9/2 adding as a hospital followup. [place]

## 2019-11-20 ENCOUNTER — Other Ambulatory Visit: Payer: Self-pay | Admitting: Family Medicine

## 2019-11-20 ENCOUNTER — Telehealth: Payer: Self-pay | Admitting: Family Medicine

## 2019-11-20 ENCOUNTER — Telehealth: Payer: Self-pay | Admitting: *Deleted

## 2019-11-20 MED ORDER — ALPRAZOLAM 1 MG PO TABS
0.5000 mg | ORAL_TABLET | Freq: Every evening | ORAL | 1 refills | Status: DC | PRN
Start: 2019-11-20 — End: 2020-07-13

## 2019-11-20 NOTE — Telephone Encounter (Signed)
Pharmacy faxed over a request for alprazolam  Last written: 02/28/19 Last ov: 02/06/19 Next ov: 11/27/19 Contract: will get at next visit UDS:  Will get at next visit

## 2019-11-20 NOTE — Telephone Encounter (Signed)
Sent in

## 2019-11-20 NOTE — Telephone Encounter (Signed)
diphenoxylate-atropine2.5-0.025 MG tablet(LOMOTIL)  Patient was told to stop taking medication in at d/c of hospital stay. Patient would like to know if she can start taking it again .   Please Advise

## 2019-11-20 NOTE — Telephone Encounter (Signed)
Requesting: xanax Contract:04/30/2018 UDS:n/a Last Visit: 06/20/19 Next Visit:11/27/19 Last Refill:03/10/19  Please Advise

## 2019-11-20 NOTE — Telephone Encounter (Signed)
Reviewed d/c summary they gave her dicyclomine to use for diarrhea in place of the lomotil

## 2019-11-21 MED FILL — ALPRAZOLAM 1 MG TABS: 1 | 90 days supply | Qty: 90 | Fill #0

## 2019-11-21 NOTE — Telephone Encounter (Signed)
Left message on machine to call back  

## 2019-11-24 NOTE — Telephone Encounter (Signed)
Pt. Notified and understands for medication to be taken for diarrhea symptoms, as prescribed by provider.

## 2019-11-26 MED FILL — HEMOCYTE PLUS CAPSULE: 106-1 | 30 days supply | Qty: 60 | Fill #1

## 2019-11-27 ENCOUNTER — Ambulatory Visit (INDEPENDENT_AMBULATORY_CARE_PROVIDER_SITE_OTHER): Payer: PPO | Admitting: Family Medicine

## 2019-11-27 ENCOUNTER — Other Ambulatory Visit: Payer: Self-pay

## 2019-11-27 VITALS — BP 122/72 | HR 75 | Temp 98.2°F | Resp 12 | Ht 64.0 in | Wt 170.8 lb

## 2019-11-27 DIAGNOSIS — I82402 Acute embolism and thrombosis of unspecified deep veins of left lower extremity: Secondary | ICD-10-CM | POA: Diagnosis not present

## 2019-11-27 DIAGNOSIS — Z79899 Other long term (current) drug therapy: Secondary | ICD-10-CM

## 2019-11-27 DIAGNOSIS — E538 Deficiency of other specified B group vitamins: Secondary | ICD-10-CM | POA: Diagnosis not present

## 2019-11-27 DIAGNOSIS — I10 Essential (primary) hypertension: Secondary | ICD-10-CM

## 2019-11-27 DIAGNOSIS — G4700 Insomnia, unspecified: Secondary | ICD-10-CM

## 2019-11-27 DIAGNOSIS — R197 Diarrhea, unspecified: Secondary | ICD-10-CM | POA: Diagnosis not present

## 2019-11-27 DIAGNOSIS — E876 Hypokalemia: Secondary | ICD-10-CM | POA: Diagnosis not present

## 2019-11-27 DIAGNOSIS — E785 Hyperlipidemia, unspecified: Secondary | ICD-10-CM

## 2019-11-27 NOTE — Patient Instructions (Signed)
Sepsis, Diagnosis, Adult Sepsis is a serious bodily reaction to an infection. The infection that triggers sepsis may be from a bacteria, virus, or fungus. Sepsis can result from an infection in any part of your body. Infections that commonly lead to sepsis include skin, lung, and urinary tract infections. Sepsis is a medical emergency that must be treated right away in a hospital. In severe cases, it can lead to septic shock. Septic shock can weaken your heart and cause your blood pressure to drop. This can cause your central nervous system and your body's organs to stop working. What are the causes? This condition is caused by a severe reaction to infections from bacteria, viruses, or fungus. The germs that most often lead to sepsis include:  Escherichia coli (E. coli) bacteria.  Staphylococcus aureus (staph) bacteria.  Some types of Streptococcus bacteria. The most common infections affect these organs:  The lung (pneumonia).  The kidneys or bladder (urinary tract infection).  The skin (cellulitis).  The bowel, gallbladder, or pancreas. What increases the risk? You are more likely to develop this condition if:  Your body's disease-fighting system (immune system) is weakened.  You are age 65 or older.  You are female.  You had surgery or you have been hospitalized.  You have these devices inserted into your body: ? A small, thin tube (catheter). ? IV line. ? Breathing tube. ? Drainage tube.  You are not getting enough nutrients from food (malnourished).  You have a long-term (chronic) disease, such as cancer, lung disease, kidney disease, or diabetes.  You are African American. What are the signs or symptoms? Symptoms of this condition may include:  Fever.  Chills or feeling very cold.  Confusion or anxiety.  Fatigue.  Muscle aches.  Shortness of breath.  Nausea and vomiting.  Urinating much less than usual.  Fast heart rate (tachycardia).  Rapid  breathing (hyperventilation).  Changes in skin color. Your skin may look blotchy, pale, or blue.  Cool, clammy, or sweaty skin.  Skin rash. Other symptoms depend on the source of your infection. How is this diagnosed? This condition is diagnosed based on:  Your symptoms.  Your medical history.  A physical exam. Other tests may also be done to find out the cause of the infection and how severe the sepsis is. These tests may include:  Blood tests.  Urine tests.  Swabs from other areas of your body that may have an infection. These samples may be tested (cultured) to find out what type of bacteria is causing the infection.  Chest X-ray to check for pneumonia. Other imaging tests, such as a CT scan, may also be done.  Lumbar puncture. This removes a small amount of the fluid that surrounds your brain and spinal cord. The fluid is then examined for infection. How is this treated? This condition must be treated in a hospital. Based on the cause of your infection, you may be given an antibiotic, antiviral, or antifungal medicine. You may also receive:  Fluids through an IV.  Oxygen and breathing assistance.  Medicines to increase your blood pressure.  Kidney dialysis. This process cleans your blood if your kidneys have failed.  Surgery to remove infected tissue.  Blood transfusion if needed.  Medicine to prevent blood clots.  Nutrients to correct imbalances in basic body function (metabolism). You may: ? Receive important salts and minerals (electrolytes) through an IV. ? Have your blood sugar level adjusted. Follow these instructions at home: Medicines   Take over-the-counter and   prescription medicines only as told by your health care provider.  If you were prescribed an antibiotic, antiviral, or antifungal medicine, take it as told by your health care provider. Do not stop taking the medicine even if you start to feel better. General instructions  If you have a  catheter or other indwelling device, ask to have it removed as soon as possible.  Keep all follow-up visits as told by your health care provider. This is important. Contact a health care provider if:  You do not feel like you are getting better or regaining strength.  You are having trouble coping with your recovery.  You frequently feel tired.  You feel worse or do not seem to get better after surgery.  You think you may have an infection after surgery. Get help right away if:  You have any symptoms of sepsis.  You have difficulty breathing.  You have a rapid or skipping heartbeat.  You become confused or disoriented.  You have a high fever.  Your skin becomes blotchy, pale, or blue.  You have an infection that is getting worse or not getting better. These symptoms may represent a serious problem that is an emergency. Do not wait to see if the symptoms will go away. Get medical help right away. Call your local emergency services (911 in the [place] Do not drive yourself to the hospital. Summary  Sepsis is a medical emergency that requires immediate treatment in a hospital.  This condition is caused by a severe reaction to infections from bacteria, viruses, or fungus.  Based on the cause of your infection, you may be given an antibiotic, antiviral, or antifungal medicine.  Treatment may also include IV fluids, breathing assistance, and kidney dialysis. This information is not intended to replace advice given to you by your health care provider. Make sure you discuss any questions you have with your health care provider. Document Revised: 10/19/2017 Document Reviewed: 10/19/2017 Elsevier Patient Education  2020 Elsevier Inc.  

## 2019-11-28 ENCOUNTER — Other Ambulatory Visit: Payer: Self-pay | Admitting: *Deleted

## 2019-11-28 DIAGNOSIS — I10 Essential (primary) hypertension: Secondary | ICD-10-CM | POA: Diagnosis not present

## 2019-11-28 DIAGNOSIS — M170 Bilateral primary osteoarthritis of knee: Secondary | ICD-10-CM | POA: Diagnosis not present

## 2019-11-28 DIAGNOSIS — F419 Anxiety disorder, unspecified: Secondary | ICD-10-CM | POA: Diagnosis not present

## 2019-11-28 DIAGNOSIS — I82402 Acute embolism and thrombosis of unspecified deep veins of left lower extremity: Secondary | ICD-10-CM | POA: Diagnosis not present

## 2019-11-28 DIAGNOSIS — M47812 Spondylosis without myelopathy or radiculopathy, cervical region: Secondary | ICD-10-CM | POA: Diagnosis not present

## 2019-11-28 DIAGNOSIS — E876 Hypokalemia: Secondary | ICD-10-CM | POA: Diagnosis not present

## 2019-11-28 DIAGNOSIS — M4802 Spinal stenosis, cervical region: Secondary | ICD-10-CM | POA: Diagnosis not present

## 2019-11-28 DIAGNOSIS — M4312 Spondylolisthesis, cervical region: Secondary | ICD-10-CM | POA: Diagnosis not present

## 2019-11-28 DIAGNOSIS — K219 Gastro-esophageal reflux disease without esophagitis: Secondary | ICD-10-CM | POA: Diagnosis not present

## 2019-11-28 DIAGNOSIS — E538 Deficiency of other specified B group vitamins: Secondary | ICD-10-CM | POA: Diagnosis not present

## 2019-11-28 DIAGNOSIS — M19031 Primary osteoarthritis, right wrist: Secondary | ICD-10-CM | POA: Diagnosis not present

## 2019-11-28 DIAGNOSIS — D509 Iron deficiency anemia, unspecified: Secondary | ICD-10-CM | POA: Diagnosis not present

## 2019-11-28 DIAGNOSIS — M152 Bouchard's nodes (with arthropathy): Secondary | ICD-10-CM | POA: Diagnosis not present

## 2019-11-28 DIAGNOSIS — M2578 Osteophyte, vertebrae: Secondary | ICD-10-CM | POA: Diagnosis not present

## 2019-11-28 DIAGNOSIS — I083 Combined rheumatic disorders of mitral, aortic and tricuspid valves: Secondary | ICD-10-CM | POA: Diagnosis not present

## 2019-11-28 DIAGNOSIS — M19011 Primary osteoarthritis, right shoulder: Secondary | ICD-10-CM | POA: Diagnosis not present

## 2019-11-28 DIAGNOSIS — K5790 Diverticulosis of intestine, part unspecified, without perforation or abscess without bleeding: Secondary | ICD-10-CM | POA: Diagnosis not present

## 2019-11-28 DIAGNOSIS — M151 Heberden's nodes (with arthropathy): Secondary | ICD-10-CM | POA: Diagnosis not present

## 2019-11-28 DIAGNOSIS — M85872 Other specified disorders of bone density and structure, left ankle and foot: Secondary | ICD-10-CM | POA: Diagnosis not present

## 2019-11-28 DIAGNOSIS — M810 Age-related osteoporosis without current pathological fracture: Secondary | ICD-10-CM | POA: Diagnosis not present

## 2019-11-28 DIAGNOSIS — M545 Low back pain: Secondary | ICD-10-CM | POA: Diagnosis not present

## 2019-11-28 DIAGNOSIS — D72829 Elevated white blood cell count, unspecified: Secondary | ICD-10-CM | POA: Diagnosis not present

## 2019-11-28 DIAGNOSIS — M19041 Primary osteoarthritis, right hand: Secondary | ICD-10-CM | POA: Diagnosis not present

## 2019-11-28 DIAGNOSIS — E785 Hyperlipidemia, unspecified: Secondary | ICD-10-CM | POA: Diagnosis not present

## 2019-11-28 LAB — CBC WITH DIFFERENTIAL/PLATELET
Absolute Monocytes: 801 cells/uL (ref 200–950)
Basophils Absolute: 42 cells/uL (ref 0–200)
Basophils Relative: 0.4 %
Eosinophils Absolute: 146 cells/uL (ref 15–500)
Eosinophils Relative: 1.4 %
HCT: 37.4 % (ref 35.0–45.0)
Hemoglobin: 12.1 g/dL (ref 11.7–15.5)
Lymphs Abs: 2621 cells/uL (ref 850–3900)
MCH: 29.4 pg (ref 27.0–33.0)
MCHC: 32.4 g/dL (ref 32.0–36.0)
MCV: 90.8 fL (ref 80.0–100.0)
MPV: 9.4 fL (ref 7.5–12.5)
Monocytes Relative: 7.7 %
Neutro Abs: 6791 cells/uL (ref 1500–7800)
Neutrophils Relative %: 65.3 %
Platelets: 361 10*3/uL (ref 140–400)
RBC: 4.12 10*6/uL (ref 3.80–5.10)
RDW: 14.1 % (ref 11.0–15.0)
Total Lymphocyte: 25.2 %
WBC: 10.4 10*3/uL (ref 3.8–10.8)

## 2019-11-28 LAB — COMPREHENSIVE METABOLIC PANEL
AG Ratio: 1.5 (calc) (ref 1.0–2.5)
ALT: 23 U/L (ref 6–29)
AST: 17 U/L (ref 10–35)
Albumin: 4 g/dL (ref 3.6–5.1)
Alkaline phosphatase (APISO): 61 U/L (ref 37–153)
BUN/Creatinine Ratio: 40 (calc) — ABNORMAL HIGH (ref 6–22)
BUN: 19 mg/dL (ref 7–25)
CO2: 22 mmol/L (ref 20–32)
Calcium: 9.2 mg/dL (ref 8.6–10.4)
Chloride: 102 mmol/L (ref 98–110)
Creat: 0.48 mg/dL — ABNORMAL LOW (ref 0.60–0.88)
Globulin: 2.7 g/dL (calc) (ref 1.9–3.7)
Glucose, Bld: 95 mg/dL (ref 65–99)
Potassium: 3.2 mmol/L — ABNORMAL LOW (ref 3.5–5.3)
Sodium: 136 mmol/L (ref 135–146)
Total Bilirubin: 0.3 mg/dL (ref 0.2–1.2)
Total Protein: 6.7 g/dL (ref 6.1–8.1)

## 2019-11-30 LAB — DRUG MONITORING, PANEL 8 WITH CONFIRMATION, URINE
6 Acetylmorphine: NEGATIVE ng/mL (ref <10)
Alcohol Metabolites: NEGATIVE ng/mL
Alphahydroxyalprazolam: 71 ng/mL — ABNORMAL HIGH (ref <25)
Alphahydroxymidazolam: NEGATIVE ng/mL (ref <50)
Alphahydroxytriazolam: NEGATIVE ng/mL (ref <50)
Aminoclonazepam: NEGATIVE ng/mL (ref <25)
Amphetamines: NEGATIVE ng/mL (ref <500)
Benzodiazepines: POSITIVE ng/mL — AB (ref <100)
Buprenorphine, Urine: NEGATIVE ng/mL (ref <5)
Cocaine Metabolite: NEGATIVE ng/mL (ref <150)
Creatinine: 41.6 mg/dL
Hydroxyethylflurazepam: NEGATIVE ng/mL (ref <50)
Lorazepam: NEGATIVE ng/mL (ref <50)
MDMA: NEGATIVE ng/mL (ref <500)
Marijuana Metabolite: NEGATIVE ng/mL (ref <20)
Nordiazepam: NEGATIVE ng/mL (ref <50)
Opiates: NEGATIVE ng/mL (ref <100)
Oxazepam: NEGATIVE ng/mL (ref <50)
Oxidant: NEGATIVE ug/mL
Oxycodone: NEGATIVE ng/mL (ref <100)
Temazepam: NEGATIVE ng/mL (ref <50)
pH: 7.6 (ref 4.5–9.0)

## 2019-11-30 LAB — DM TEMPLATE

## 2019-12-01 DIAGNOSIS — R197 Diarrhea, unspecified: Secondary | ICD-10-CM | POA: Insufficient documentation

## 2019-12-01 NOTE — Assessment & Plan Note (Signed)
Had severe left ankle pain and presented to hospital which showed a blood clot. She has a GF filter and is tolerating Eliquis

## 2019-12-01 NOTE — Assessment & Plan Note (Signed)
Well controlled, no changes to meds. Encouraged heart healthy diet such as the DASH diet and exercise as tolerated.  °

## 2019-12-01 NOTE — Progress Notes (Signed)
Subjective:    Patient ID: Alexis Olson, female    DOB: 11/30/1933, 86 y.o.   MRN: [id number]  Chief Complaint  Patient presents with  . Get UDS and Comtracts Signed by Patient    HPI Patient is in today for follow up on chronic medical concerns and status post a recent hospitalization for left ankle pain when she was diagnosed with a blood clot. She is feeling better and is tolerating Eliquis. No other concerns. No recent febrile illness but she is experiencing diarrhea with several loose stool daily. No bloody ortarry stool. Has taken an antibiotic recently. Denies CP/palp/SOB/HA/congestion/fevers or GU c/o. Taking meds as prescribed  Past Medical History:  Diagnosis Date  . ALLERGIC RHINITIS   . ANEMIA, CHRONIC    Malabsorption related to bypass hx, B12 and iron deficiency  . ANXIETY   . Arthritis   . ASTHMA   . Bariatric surgery status   . Bowel perforation ([place]) 2016  . C. difficile diarrhea   . Colon perforation ([place]) 08/2014   following polypetomy during colo, surgical repair  . COLONIC POLYPS, ADENOMATOUS, HX OF 2010  . DIVERTICULOSIS, COLON   . DVT (deep venous thrombosis) ([place]) 2018  . DVT (deep venous thrombosis) ([place])   . GERD (gastroesophageal reflux disease) 08/23/2015  . Graded compression stocking in place   . History of blood transfusion   . History of migraine   . History of rheumatic fever   . HSV (herpes simplex virus) infection 08/16/2015  . Hyperlipidemia   . Hypertension    Denies  . Hyponatremia 05/02/2016  . Incomplete left bundle branch block (LBBB) 2016   Noted on EKG  . INSOMNIA   . Lumbosacral disc disease    Chronic pain; History of osteomyelitis 2010 following ESI complication  . Obesity   . Osteomyelitis ([place])   . Pelvic fracture ([place])   . Pneumonia   . Recurrent UTI 08/23/2015  . URINARY URGENCY   . Ventral hernia without obstruction or gangrene 05/22/2018  . VITAMIN B12 DEFICIENCY     Past Surgical History:  Procedure Laterality  Date  . [place]   for ruptured disc  . CHOLECYSTECTOMY    . COLONOSCOPY    . [place] filter    . ileojejunal bypass  1976   for obesity  . LAPAROTOMY N/A 09/18/2014   Procedure: EXPLORATORY LAPAROTOMY WITH REPAIR OF CECIAL PERFORATION;  Surgeon: Autumn Messing III, MD;  Location: [facility];  Service: General;  Laterality: N/A;  . TONSILLECTOMY    . TOTAL KNEE ARTHROPLASTY Right 08/26/2019   Procedure: RIGHT TOTAL KNEE ARTHROPLASTY, LEFT KNEE CORTISONE INJECTION;  Surgeon: Paralee Cancel, MD;  Location: [facility];  Service: Orthopedics;  Laterality: Right;  70 MINS  . UPPER GI ENDOSCOPY    . VASCULAR SURGERY Right    revascularization right lower extremity    Family History  Problem Relation Age of Onset  . Cervical cancer Mother   . Stroke Mother   . Liver disease Sister   . Kidney disease Sister   . Cirrhosis Sister   . Colon cancer Neg Hx   . Esophageal cancer Neg Hx   . Rectal cancer Neg Hx   . Stomach cancer Neg Hx     Social History   Socioeconomic History  . Marital status: Married    Spouse name: Not on file  . Number of children: 3  . Years of education: Not on file  . Highest education level: Not  on file  Occupational History  . Occupation: retired Programmer, multimedia: RETIRED  Tobacco Use  . Smoking status: Never Smoker  . Smokeless tobacco: Never Used  Substance and Sexual Activity  . Alcohol use: No    Alcohol/week: 0.0 standard drinks  . Drug use: No  . Sexual activity: Never    Partners: Male  Other Topics Concern  . Not on file  Social History Narrative   Lives with spouse, Indep ADLs   Supportive family nearby   No dietary restrictions   Social Determinants of Health   Financial Resource Strain:   . Difficulty of Paying Living Expenses: Not on file  Food Insecurity:   . Worried About Charity fundraiser in the Last Year: Not on file  . Ran Out of Food in the Last Year: Not on file  Transportation Needs:   . Lack of Transportation (Medical):  Not on file  . Lack of Transportation (Non-Medical): Not on file  Physical Activity:   . Days of Exercise per Week: Not on file  . Minutes of Exercise per Session: Not on file  Stress:   . Feeling of Stress : Not on file  Social Connections:   . Frequency of Communication with Friends and Family: Not on file  . Frequency of Social Gatherings with Friends and Family: Not on file  . Attends Religious Services: Not on file  . Active Member of Clubs or Organizations: Not on file  . Attends Archivist Meetings: Not on file  . Marital Status: Not on file  Intimate Partner Violence:   . Fear of Current or Ex-Partner: Not on file  . Emotionally Abused: Not on file  . Physically Abused: Not on file  . Sexually Abused: Not on file    Outpatient Medications Prior to Visit  Medication Sig Dispense Refill  . acetaminophen (TYLENOL) 500 MG tablet Take 2 tablets (1,000 mg total) by mouth every 8 (eight) hours. 30 tablet 0  . albuterol (PROVENTIL HFA;VENTOLIN HFA) 108 (90 BASE) MCG/ACT inhaler Inhale 1-2 puffs into the lungs every 6 (six) hours as needed for wheezing or shortness of breath.    . ALPRAZolam (XANAX) 1 MG tablet Take 0.5-1 tablets (0.5-1 mg total) by mouth at bedtime as needed. 90 tablet 1  . amitriptyline (ELAVIL) 100 MG tablet Take 1 tablet (100 mg total) by mouth daily. 90 tablet 1  . apixaban (ELIQUIS) 5 MG TABS tablet Take 1 tablet (5 mg total) by mouth 2 (two) times daily. 60 tablet 0  . azelastine (OPTIVAR) 0.05 % ophthalmic solution Place 1 drop into both eyes daily as needed (for dry eyes).     . benzonatate (TESSALON) 100 MG capsule Take 1 capsule (100 mg total) by mouth 3 (three) times daily as needed for cough. 30 capsule 0  . budesonide-formoterol (SYMBICORT) 160-4.5 MCG/ACT inhaler Inhale 2 puffs into the lungs See admin instructions. Inhale 2 puffs into the lungs one to two times a day (Patient taking differently: Inhale 2 puffs into the lungs in the morning and  at bedtime. Inhale 2 puffs into the lungs one to two times a day) 1 Inhaler 2  . cetirizine (ZYRTEC) 10 MG tablet Take 10 mg by mouth daily.     . cyanocobalamin (,VITAMIN B-12,) 1000 MCG/ML injection Inject 1cc into the muscle every month 10 mL 1  . dicyclomine (BENTYL) 10 MG capsule Take 1 capsule (10 mg total) by mouth 3 (three) times daily as needed (for  diarrhea & symptoms). 270 capsule 3  . docusate sodium (COLACE) 100 MG capsule Take 1 capsule (100 mg total) by mouth 2 (two) times daily. (Patient taking differently: Take 100 mg by mouth daily as needed for mild constipation. ) 28 capsule 0  . Fe Fum-FA-B Cmp-C-Zn-Mg-Mn-Cu (HEMOCYTE PLUS) 106-1 MG CAPS TAKE 1 CAPSULE BY MOUTH 2 TIMES DAILY. (Patient taking differently: Take 1 capsule by mouth in the morning and at bedtime. ) 60 capsule 1  . ferrous sulfate 325 (65 FE) MG tablet Take 1 tablet (325 mg total) by mouth 2 (two) times daily with a meal. 60 tablet 0  . fluticasone (FLONASE) 50 MCG/ACT nasal spray 2 sprays by Nasal route daily.      Marland Kitchen gabapentin (NEURONTIN) 300 MG capsule TAKE 1 CAPSULE BY MOUTH AT BEDTIME (Patient taking differently: Take 300 mg by mouth at bedtime. ) 90 capsule 1  . hydrOXYzine (ATARAX) 25 MG tablet Take 25 mg by mouth 3 (three) times daily as needed for itching.     . levocetirizine (XYZAL) 5 MG tablet Take 1 tablet (5 mg total) by mouth every evening. 90 tablet 3  . lubiprostone (AMITIZA) 24 MCG capsule Take 24 mcg by mouth daily as needed for constipation.    . magnesium oxide (MAG-OX 400) 400 MG tablet Take 400 mg by mouth 2 (two) times daily.     . methocarbamol (ROBAXIN) 500 MG tablet Take 1 tablet (500 mg total) by mouth every 6 (six) hours as needed for muscle spasms. 40 tablet 0  . montelukast (SINGULAIR) 10 MG tablet Take 10 mg by mouth daily as needed (For allergy).     Marland Kitchen omeprazole (PRILOSEC) 20 MG capsule TAKE 1 CAPSULE BY MOUTH DAILY. (Patient taking differently: Take 20 mg by mouth daily. ) 90 capsule 3   . ondansetron (ZOFRAN-ODT) 4 MG disintegrating tablet Take 1 tablet (4 mg total) by mouth 3 (three) times daily as needed for nausea or vomiting. 20 tablet 0  . oxyCODONE (OXY IR/ROXICODONE) 5 MG immediate release tablet Take 1-2 tablets (5-10 mg total) by mouth every 4 (four) hours as needed for moderate pain or severe pain. 56 tablet 0  . potassium chloride SA (KLOR-CON) 20 MEQ tablet TAKE 1 TABLET BY MOUTH ONCE DAILY (Patient taking differently: Take 20 mEq by mouth daily. ) 90 tablet 1  . Probiotic Product (PROBIOTIC DAILY PO) Take 1 capsule by mouth daily.    . tamsulosin (FLOMAX) 0.4 MG CAPS capsule Take 1 capsule (0.4 mg total) by mouth daily after supper. 30 capsule 0  . Vitamin D, Ergocalciferol, (DRISDOL) 1.25 MG (50000 UNIT) CAPS capsule TAKE 1 CAPSULE (50,000 UNITS TOTAL) BY MOUTH EVERY 7 (SEVEN) DAYS. 12 capsule 4  . zolpidem (AMBIEN) 5 MG tablet TAKE 1 TABLET BY MOUTH AT BEDTIME (Patient taking differently: Take 5 mg by mouth at bedtime. ) 90 tablet 1   No facility-administered medications prior to visit.    Allergies  Allergen Reactions  . Ativan [Lorazepam] Anaphylaxis and Other (See Comments)    Deathly allergic; "resp rate dropped dropped to 6"  . Other Anaphylaxis    Red peppers--choking, also  . Azithromycin Diarrhea  . Benadryl [Diphenhydramine Hcl] Other (See Comments)    Paradoxical response  . Ciprofloxacin Hcl Other (See Comments)    Causes yeast infection and refuses to take  . Codeine Phosphate Nausea Only    Can tolerate in limited amounts  . Hydrocodone Itching and Nausea And Vomiting  . Hydrocodone-Homatropine Itching and Nausea  And Vomiting  . Levaquin [Levofloxacin In D5w] Other (See Comments)    Muscle soreness  . Levofloxacin   . Meperidine Hcl Other (See Comments)    Reaction ??  . Molds & Smuts Other (See Comments)    unsure  . Sulfa Antibiotics Other (See Comments)    Joint pain  . Tape Other (See Comments)    No Coban wrap (per the patient)    . Tramadol   . Ultram [Tramadol Hcl] Other (See Comments)    "Can't move joints"    Review of Systems  Constitutional: Positive for malaise/fatigue. Negative for fever.  HENT: Negative for congestion.   Eyes: Negative for blurred vision.  Respiratory: Negative for shortness of breath.   Cardiovascular: Negative for chest pain, palpitations and leg swelling.  Gastrointestinal: Positive for diarrhea. Negative for abdominal pain, blood in stool and nausea.  Genitourinary: Negative for dysuria and frequency.  Musculoskeletal: Positive for joint pain. Negative for falls.  Skin: Negative for rash.  Neurological: Negative for dizziness, loss of consciousness and headaches.  Endo/Heme/Allergies: Negative for environmental allergies.  Psychiatric/Behavioral: Negative for depression. The patient is not nervous/anxious.        Objective:    Physical Exam Vitals and nursing note reviewed.  Constitutional:      General: She is not in acute distress.    Appearance: She is well-developed.  HENT:     Head: Normocephalic and atraumatic.     Nose: Nose normal.  Eyes:     General:        Right eye: No discharge.        Left eye: No discharge.  Cardiovascular:     Rate and Rhythm: Normal rate and regular rhythm.     Heart sounds: No murmur heard.   Pulmonary:     Effort: Pulmonary effort is normal.     Breath sounds: Normal breath sounds.  Abdominal:     General: Bowel sounds are normal.     Palpations: Abdomen is soft.     Tenderness: There is no abdominal tenderness.  Musculoskeletal:     Cervical back: Normal range of motion and neck supple.  Skin:    General: Skin is warm and dry.  Neurological:     Mental Status: She is alert and oriented to person, place, and time.     BP 122/72 (BP Location: Left Arm, Patient Position: Sitting, Cuff Size: Small)   Pulse 75   Temp 98.2 F (36.8 C) (Oral)   Resp 12   Ht 5\' 4"  (1.626 m)   Wt 170 lb 12.8 oz (77.5 kg)   SpO2 97%   BMI  29.32 kg/m  Wt Readings from Last 3 Encounters:  11/27/19 170 lb 12.8 oz (77.5 kg)  11/06/19 175 lb (79.4 kg)  09/19/19 180 lb (81.6 kg)    Diabetic Foot Exam - Simple   No data filed     Lab Results  Component Value Date   WBC 10.4 11/27/2019   HGB 12.1 11/27/2019   HCT 37.4 11/27/2019   PLT 361 11/27/2019   GLUCOSE 95 11/27/2019   CHOL 151 02/06/2019   TRIG 164.0 (H) 02/06/2019   HDL 49.80 02/06/2019   LDLDIRECT 76.0 10/04/2018   LDLCALC 68 02/06/2019   ALT 23 11/27/2019   AST 17 11/27/2019   NA 136 11/27/2019   K 3.2 (L) 11/27/2019   CL 102 11/27/2019   CREATININE 0.48 (L) 11/27/2019   BUN 19 11/27/2019   CO2 22 11/27/2019  TSH 1.572 11/07/2019   INR 1.01 05/13/2009   HGBA1C 5.1 11/07/2019    Lab Results  Component Value Date   TSH 1.572 11/07/2019   Lab Results  Component Value Date   WBC 10.4 11/27/2019   HGB 12.1 11/27/2019   HCT 37.4 11/27/2019   MCV 90.8 11/27/2019   PLT 361 11/27/2019   Lab Results  Component Value Date   NA 136 11/27/2019   K 3.2 (L) 11/27/2019   CO2 22 11/27/2019   GLUCOSE 95 11/27/2019   BUN 19 11/27/2019   CREATININE 0.48 (L) 11/27/2019   BILITOT 0.3 11/27/2019   ALKPHOS 54 11/16/2019   AST 17 11/27/2019   ALT 23 11/27/2019   PROT 6.7 11/27/2019   ALBUMIN 2.9 (L) 11/16/2019   CALCIUM 9.2 11/27/2019   ANIONGAP 12 11/16/2019   GFR 117.43 09/23/2019   Lab Results  Component Value Date   CHOL 151 02/06/2019   Lab Results  Component Value Date   HDL 49.80 02/06/2019   Lab Results  Component Value Date   LDLCALC 68 02/06/2019   Lab Results  Component Value Date   TRIG 164.0 (H) 02/06/2019   Lab Results  Component Value Date   CHOLHDL 3 02/06/2019   Lab Results  Component Value Date   HGBA1C 5.1 11/07/2019       Assessment & Plan:   Problem List Items Addressed This Visit    Vitamin B 12 deficiency    Supplement and monitor      Essential hypertension - Primary    Well controlled, no changes  to meds. Encouraged heart healthy diet such as the DASH diet and exercise as tolerated.       Relevant Orders   CBC with Differential/Platelet (Completed)   Comprehensive metabolic panel (Completed)   INSOMNIA    Encouraged good sleep hygiene such as dark, quiet room. No blue/green glowing lights such as computer screens in bedroom. No alcohol or stimulants in evening. Cut down on caffeine as able. Regular exercise is helpful but not just prior to bed time. May continue current meds prn      Relevant Orders   DRUG MONITORING, PANEL 8 WITH CONFIRMATION, URINE (Completed)   DM TEMPLATE (Completed)   Hypokalemia    Recurrent and asymptomatic, increase potassium intake in diet and repeat cmp. Seek care if palpitations or muscle cramps occur.       Hyperlipidemia, mild    Encouraged heart healthy diet, increase exercise, avoid trans fats, consider a krill oil cap daily      DVT (deep venous thrombosis) ([place])    Had severe left ankle pain and presented to hospital which showed a blood clot. She has a GF filter and is tolerating Eliquis      Diarrhea    Check stool for cdiff and cultures. Add gatorade, seek care if worsens.      Relevant Orders   Stool Culture   Stool, WBC/Lactoferrin   Ova and parasite examination   Clostridium difficile Toxin B, Qualitative, Real-Time PCR([facility])   CBC with Differential/Platelet (Completed)   Comprehensive metabolic panel (Completed)    Other Visit Diagnoses    High risk medication use       Relevant Orders   DRUG MONITORING, PANEL 8 WITH CONFIRMATION, URINE (Completed)      I am having Alexis Olson. Hurwitz maintain her fluticasone, hydrOXYzine, magnesium oxide, azelastine, montelukast, cetirizine, levocetirizine, albuterol, budesonide-formoterol, benzonatate, dicyclomine, omeprazole, gabapentin, Probiotic Product (PROBIOTIC DAILY PO), lubiprostone, docusate sodium, methocarbamol, oxyCODONE,  ondansetron, acetaminophen, Hemocyte Plus, potassium  chloride SA, amitriptyline, Vitamin D (Ergocalciferol), zolpidem, cyanocobalamin, ferrous sulfate, tamsulosin, apixaban, and ALPRAZolam.  No orders of the defined types were placed in this encounter.    Penni Homans, MD

## 2019-12-01 NOTE — Assessment & Plan Note (Signed)
Encouraged good sleep hygiene such as dark, quiet room. No blue/green glowing lights such as computer screens in bedroom. No alcohol or stimulants in evening. Cut down on caffeine as able. Regular exercise is helpful but not just prior to bed time. May continue current meds prn

## 2019-12-01 NOTE — Assessment & Plan Note (Signed)
Supplement and monitor 

## 2019-12-01 NOTE — Assessment & Plan Note (Signed)
Recurrent and asymptomatic, increase potassium intake in diet and repeat cmp. Seek care if palpitations or muscle cramps occur.

## 2019-12-01 NOTE — Assessment & Plan Note (Signed)
Encouraged heart healthy diet, increase exercise, avoid trans fats, consider a krill oil cap daily 

## 2019-12-01 NOTE — Assessment & Plan Note (Signed)
Check stool for cdiff and cultures. Add gatorade, seek care if worsens.

## 2019-12-02 ENCOUNTER — Other Ambulatory Visit: Payer: Self-pay

## 2019-12-02 ENCOUNTER — Other Ambulatory Visit: Payer: PPO

## 2019-12-02 DIAGNOSIS — E876 Hypokalemia: Secondary | ICD-10-CM | POA: Diagnosis not present

## 2019-12-02 DIAGNOSIS — R197 Diarrhea, unspecified: Secondary | ICD-10-CM | POA: Diagnosis not present

## 2019-12-02 NOTE — Progress Notes (Signed)
Left voice mal message drug screen was good and will repeat around 6 months.  [place]

## 2019-12-02 NOTE — Addendum Note (Signed)
Addended by: Angelina Pih on: 12/02/2019 03:23 PM   Modules accepted: Orders

## 2019-12-02 NOTE — Addendum Note (Signed)
Addended by: Kelle Darting A on: 12/02/2019 03:31 PM   Modules accepted: Orders

## 2019-12-03 LAB — COMPREHENSIVE METABOLIC PANEL
AG Ratio: 1.5 (calc) (ref 1.0–2.5)
ALT: 18 U/L (ref 6–29)
AST: 14 U/L (ref 10–35)
Albumin: 3.9 g/dL (ref 3.6–5.1)
Alkaline phosphatase (APISO): 61 U/L (ref 37–153)
BUN/Creatinine Ratio: 28 (calc) — ABNORMAL HIGH (ref 6–22)
BUN: 15 mg/dL (ref 7–25)
CO2: 26 mmol/L (ref 20–32)
Calcium: 9.5 mg/dL (ref 8.6–10.4)
Chloride: 104 mmol/L (ref 98–110)
Creat: 0.53 mg/dL — ABNORMAL LOW (ref 0.60–0.88)
Globulin: 2.6 g/dL (calc) (ref 1.9–3.7)
Glucose, Bld: 94 mg/dL (ref 65–99)
Potassium: 3.6 mmol/L (ref 3.5–5.3)
Sodium: 139 mmol/L (ref 135–146)
Total Bilirubin: 0.4 mg/dL (ref 0.2–1.2)
Total Protein: 6.5 g/dL (ref 6.1–8.1)

## 2019-12-08 ENCOUNTER — Telehealth: Payer: Self-pay

## 2019-12-08 ENCOUNTER — Other Ambulatory Visit: Payer: Self-pay | Admitting: Family Medicine

## 2019-12-08 ENCOUNTER — Other Ambulatory Visit: Payer: Self-pay | Admitting: Internal Medicine

## 2019-12-08 LAB — FECAL LACTOFERRIN, QUANT
Fecal Lactoferrin: NEGATIVE
MICRO NUMBER:: 10917429
SPECIMEN QUALITY:: ADEQUATE

## 2019-12-08 LAB — CLOSTRIDIUM DIFFICILE TOXIN B, QUALITATIVE, REAL-TIME PCR: Toxigenic C. Difficile by PCR: NOT DETECTED

## 2019-12-08 MED ORDER — DIPHENOXYLATE-ATROPINE 2.5-0.025 MG PO TABS: 1.0000 | ORAL_TABLET | Freq: Four times a day (QID) | ORAL | 1 refills | Status: DC | PRN

## 2019-12-08 MED FILL — DIPHENOXYLATE-ATROPINE 2.5-: 2.5-0.025 | 25 days supply | Qty: 150 | Fill #0

## 2019-12-08 MED FILL — DICYCLOMINE 10 MG CAPSULE: 10 | 90 days supply | Qty: 270 | Fill #0

## 2019-12-08 NOTE — Telephone Encounter (Signed)
I spoke with Alexis Olson and she said she's been on the lomotil since she was 86 years old. I sent in the lomotil refill to [facility] as requested.

## 2019-12-09 DIAGNOSIS — Z20828 Contact with and (suspected) exposure to other viral communicable diseases: Secondary | ICD-10-CM | POA: Diagnosis not present

## 2019-12-12 LAB — SALMONELLA/SHIGELLA CULT, CAMPY EIA AND SHIGA TOXIN RFL ECOLI
MICRO NUMBER: 10919311
MICRO NUMBER:: 10919312
MICRO NUMBER:: 10919314
Result:: NOT DETECTED
SHIGA RESULT:: NOT DETECTED
SPECIMEN QUALITY: ADEQUATE
SPECIMEN QUALITY:: ADEQUATE
SPECIMEN QUALITY:: ADEQUATE

## 2019-12-12 LAB — OVA AND PARASITE EXAMINATION
CONCENTRATE RESULT:: NONE SEEN
MICRO NUMBER:: 10919313
SPECIMEN QUALITY:: ADEQUATE
TRICHROME RESULT:: NONE SEEN

## 2019-12-14 ENCOUNTER — Ambulatory Visit (HOSPITAL_COMMUNITY)
Admission: EM | Admit: 2019-12-14 | Discharge: 2019-12-14 | Disposition: A | Discharge status: Home / Self Care | Payer: PPO | Attending: Emergency Medicine | Admitting: Emergency Medicine

## 2019-12-14 ENCOUNTER — Other Ambulatory Visit: Payer: Self-pay

## 2019-12-14 ENCOUNTER — Encounter (HOSPITAL_COMMUNITY): Payer: Self-pay | Admitting: Emergency Medicine

## 2019-12-14 DIAGNOSIS — L03116 Cellulitis of left lower limb: Secondary | ICD-10-CM

## 2019-12-14 DIAGNOSIS — M79672 Pain in left foot: Secondary | ICD-10-CM | POA: Diagnosis not present

## 2019-12-14 MED ORDER — CLINDAMYCIN HCL 300 MG PO CAPS: 300.0000 mg | ORAL_CAPSULE | Freq: Three times a day (TID) | ORAL | 0 refills | Status: DC

## 2019-12-14 MED ORDER — OXYCODONE HCL 5 MG PO TABS
5.0000 mg | ORAL_TABLET | Freq: Four times a day (QID) | ORAL | 0 refills | Status: DC | PRN
Start: 2019-12-14 — End: 2020-07-10

## 2019-12-14 NOTE — Discharge Instructions (Signed)
Begin clindamycin every 8 hours for the next week Tylenol for mild to moderate pain Oxycodone for severe pain Continue Eliquis as prescribed Follow-up here or emergency room if developing any increased swelling pain or redness to leg

## 2019-12-14 NOTE — ED Triage Notes (Addendum)
Patient had as recent hospitalization secondary sepsis and blood clot  Patient had been doing well, but left foot started hurting yesterday and pain has worsened today.  Patient is concerned this is a repeat of previous hospitalization. Patient feels strong antibiotics are needed.  Left foot is swollen.    Pedal pulse is 2 + in left foot

## 2019-12-15 ENCOUNTER — Telehealth: Payer: Self-pay | Admitting: Family Medicine

## 2019-12-15 ENCOUNTER — Telehealth: Payer: Self-pay | Admitting: *Deleted

## 2019-12-15 NOTE — ED Provider Notes (Signed)
[facility]    CSN: [id number] Arrival date & time: 12/14/19  1451      History   Chief Complaint Chief Complaint  Patient presents with  . Foot Pain    HPI Alexis Olson is a 86 y.o. female history of hypertension, osteoporosis, recent DVT on Eliquis, presenting today for evaluation of left foot pain.  Patient reports over the past 2 days she has developed increased pain, mild redness and warmth to her left foot.  She expresses concern as she recently was admitted for swelling to her left leg due to DVT.  On chart review DVT was found to be in popliteal area she was started on Eliquis.  At the time she was also started on sepsis protocol, but no imaging showing osteomyelitis, negative blood cultures.  She does report that she was on antibiotics and is concerned about infection in her foot returning.  She denies fevers.  Denies new injury or trauma.  Has been taking Eliquis regularly.  She denies any recent swelling, calf pain or knee pain.  HPI  Past Medical History:  Diagnosis Date  . ALLERGIC RHINITIS   . ANEMIA, CHRONIC    Malabsorption related to bypass hx, B12 and iron deficiency  . ANXIETY   . Arthritis   . ASTHMA   . Bariatric surgery status   . Bowel perforation ([place]) 2016  . C. difficile diarrhea   . Colon perforation ([place]) 08/2014   following polypetomy during colo, surgical repair  . COLONIC POLYPS, ADENOMATOUS, HX OF 2010  . DIVERTICULOSIS, COLON   . DVT (deep venous thrombosis) ([place]) 2018  . DVT (deep venous thrombosis) ([place])   . GERD (gastroesophageal reflux disease) 08/23/2015  . Graded compression stocking in place   . History of blood transfusion   . History of migraine   . History of rheumatic fever   . HSV (herpes simplex virus) infection 08/16/2015  . Hyperlipidemia   . Hypertension    Denies  . Hyponatremia 05/02/2016  . Incomplete left bundle branch block (LBBB) 2016   Noted on EKG  . INSOMNIA   . Lumbosacral disc disease    Chronic  pain; History of osteomyelitis 2010 following ESI complication  . Obesity   . Osteomyelitis ([place])   . Pelvic fracture ([place])   . Pneumonia   . Recurrent UTI 08/23/2015  . URINARY URGENCY   . Ventral hernia without obstruction or gangrene 05/22/2018  . VITAMIN B12 DEFICIENCY     Patient Active Problem List   Diagnosis Date Noted  . Diarrhea 12/01/2019  . Late effects of accidental fall 11/13/2019  . DVT (deep venous thrombosis) ([place]) 11/13/2019  . Hypomagnesemia 11/13/2019  . Swelling of right hand 11/13/2019  . Acute urinary retention 11/13/2019  . S/P right TKA 08/26/2019  . Ventral hernia without obstruction or gangrene 05/22/2018  . Greenfield filter in place 03/08/2017  . Pain of right lower extremity 03/08/2017  . Preventative health care 02/13/2017  . Hyponatremia 05/02/2016  . GERD (gastroesophageal reflux disease) 08/23/2015  . HSV (herpes simplex virus) infection 08/16/2015  . Hyperlipidemia, mild 08/16/2015  . Osteoporosis   . Hypokalemia   . Chronic diarrhea   . Anemia of chronic disease   . OSTEOMYELITIS, VERTEBRA 05/05/2009  . [place] DISEASE, LUMBOSACRAL SPINE 04/22/2009  . Essential hypertension 03/30/2009  . Vitamin B 12 deficiency 07/09/2008  . DIVERTICULOSIS, COLON 07/09/2008  . COLONIC POLYPS, ADENOMATOUS, HX OF 07/09/2008  . Anxiety state 08/08/2007  . INSOMNIA 08/08/2007  .  Bariatric surgery status 08/08/2007  . Midline back pain 10/30/2006  . ALLERGIC RHINITIS 09/18/2006    Past Surgical History:  Procedure Laterality Date  . [place]   for ruptured disc  . CHOLECYSTECTOMY    . COLONOSCOPY    . greenfield filter    . ileojejunal bypass  1976   for obesity  . LAPAROTOMY N/A 09/18/2014   Procedure: EXPLORATORY LAPAROTOMY WITH REPAIR OF CECIAL PERFORATION;  Surgeon: Autumn Messing III, MD;  Location: [facility];  Service: General;  Laterality: N/A;  . TONSILLECTOMY    . TOTAL KNEE ARTHROPLASTY Right 08/26/2019   Procedure: RIGHT TOTAL KNEE  ARTHROPLASTY, LEFT KNEE CORTISONE INJECTION;  Surgeon: Paralee Cancel, MD;  Location: [facility];  Service: Orthopedics;  Laterality: Right;  70 MINS  . UPPER GI ENDOSCOPY    . VASCULAR SURGERY Right    revascularization right lower extremity    OB History   No obstetric history on file.      Home Medications    Prior to Admission medications   Medication Sig Start Date End Date Taking? Authorizing Provider  acetaminophen (TYLENOL) 500 MG tablet Take 2 tablets (1,000 mg total) by mouth every 8 (eight) hours. 08/27/19   Maurice March, PA-C  albuterol (PROVENTIL HFA;VENTOLIN HFA) 108 (90 BASE) MCG/ACT inhaler Inhale 1-2 puffs into the lungs every 6 (six) hours as needed for wheezing or shortness of breath.    [provider]  ALPRAZolam Duanne Moron) 1 MG tablet Take 0.5-1 tablets (0.5-1 mg total) by mouth at bedtime as needed. 11/20/19   Mosie Lukes, MD  amitriptyline (ELAVIL) 100 MG tablet Take 1 tablet (100 mg total) by mouth daily. 09/17/19   Mosie Lukes, MD  apixaban (ELIQUIS) 5 MG TABS tablet Take 1 tablet (5 mg total) by mouth 2 (two) times daily. 11/16/19   Allie Bossier, MD  azelastine (OPTIVAR) 0.05 % ophthalmic solution Place 1 drop into both eyes daily as needed (for dry eyes).     [provider]  benzonatate (TESSALON) 100 MG capsule Take 1 capsule (100 mg total) by mouth 3 (three) times daily as needed for cough. 04/01/18   Saguier, Percell Miller, PA-C  budesonide-formoterol (SYMBICORT) 160-4.5 MCG/ACT inhaler Inhale 2 puffs into the lungs See admin instructions. Inhale 2 puffs into the lungs one to two times a day Patient taking differently: Inhale 2 puffs into the lungs in the morning and at bedtime. Inhale 2 puffs into the lungs one to two times a day 03/04/18   Saguier, Percell Miller, PA-C  cetirizine (ZYRTEC) 10 MG tablet Take 10 mg by mouth daily.  02/05/14   [provider]  clindamycin (CLEOCIN) 300 MG capsule Take 1 capsule (300 mg total) by mouth 3 (three)  times daily for 7 days. 12/14/19 12/21/19  Wieters, Hallie C, PA-C  cyanocobalamin (,VITAMIN B-12,) 1000 MCG/ML injection Inject 1cc into the muscle every month 10/08/19   Gatha Mayer, MD  dicyclomine (BENTYL) 10 MG capsule TAKE 1 CAPSULE BY MOUTH 3 TIMES DAILY AS NEEDED FOR DIARRHEA AND SYMPTOMS 12/08/19   Gatha Mayer, MD  diphenoxylate-atropine (LOMOTIL) 2.5-0.025 MG tablet Take 1 tablet by mouth 4 (four) times daily as needed for diarrhea or loose stools. 12/08/19   Mosie Lukes, MD  docusate sodium (COLACE) 100 MG capsule Take 1 capsule (100 mg total) by mouth 2 (two) times daily. Patient taking differently: Take 100 mg by mouth daily as needed for mild constipation.  08/27/19   Stinson,  Ashley R, PA-C  Fe Fum-FA-B Cmp-C-Zn-Mg-Mn-Cu (HEMOCYTE PLUS) 106-1 MG CAPS TAKE 1 CAPSULE BY MOUTH 2 TIMES DAILY. Patient taking differently: Take 1 capsule by mouth in the morning and at bedtime.  09/02/19   Gatha Mayer, MD  ferrous sulfate 325 (65 FE) MG tablet Take 1 tablet (325 mg total) by mouth 2 (two) times daily with a meal. 11/16/19   Allie Bossier, MD  fluticasone [facility]) 50 MCG/ACT nasal spray 2 sprays by Nasal route daily.      [provider]  gabapentin (NEURONTIN) 300 MG capsule TAKE 1 CAPSULE BY MOUTH AT BEDTIME Patient taking differently: Take 300 mg by mouth at bedtime.  08/04/19   Mosie Lukes, MD  hydrOXYzine (ATARAX) 25 MG tablet Take 25 mg by mouth 3 (three) times daily as needed for itching.     [provider]  levocetirizine (XYZAL) 5 MG tablet Take 1 tablet (5 mg total) by mouth every evening. 08/04/14   Rowe Clack, MD  lubiprostone (AMITIZA) 24 MCG capsule Take 24 mcg by mouth daily as needed for constipation.    [provider]  magnesium oxide (MAG-OX 400) 400 MG tablet Take 400 mg by mouth 2 (two) times daily.     [provider]  methocarbamol (ROBAXIN) 500 MG tablet Take 1 tablet (500 mg total) by mouth every 6 (six) hours as  needed for muscle spasms. 08/27/19   Maurice March, PA-C  montelukast (SINGULAIR) 10 MG tablet Take 10 mg by mouth daily as needed (For allergy).  02/11/14   [provider]  omeprazole (PRILOSEC) 20 MG capsule TAKE 1 CAPSULE BY MOUTH DAILY. Patient taking differently: Take 20 mg by mouth daily.  06/16/19   Gatha Mayer, MD  ondansetron (ZOFRAN-ODT) 4 MG disintegrating tablet Take 1 tablet (4 mg total) by mouth 3 (three) times daily as needed for nausea or vomiting. 08/27/19   Maurice March, PA-C  oxyCODONE (OXY IR/ROXICODONE) 5 MG immediate release tablet Take 1 tablet (5 mg total) by mouth every 6 (six) hours as needed for severe pain. 12/14/19   Wieters, Hallie C, PA-C  potassium chloride SA (KLOR-CON) 20 MEQ tablet TAKE 1 TABLET BY MOUTH ONCE DAILY Patient taking differently: Take 20 mEq by mouth daily.  09/12/19   Mosie Lukes, MD  Probiotic Product (PROBIOTIC DAILY PO) Take 1 capsule by mouth daily.    [provider]  tamsulosin (FLOMAX) 0.4 MG CAPS capsule Take 1 capsule (0.4 mg total) by mouth daily after supper. 11/16/19   Allie Bossier, MD  Vitamin D, Ergocalciferol, (DRISDOL) 1.25 MG (50000 UNIT) CAPS capsule TAKE 1 CAPSULE (50,000 UNITS TOTAL) BY MOUTH EVERY 7 (SEVEN) DAYS. 09/25/19   Mosie Lukes, MD  zolpidem (AMBIEN) 5 MG tablet TAKE 1 TABLET BY MOUTH AT BEDTIME Patient taking differently: Take 5 mg by mouth at bedtime.  10/08/19   Mosie Lukes, MD    Family History Family History  Problem Relation Age of Onset  . Cervical cancer Mother   . Stroke Mother   . Liver disease Sister   . Kidney disease Sister   . Cirrhosis Sister   . Colon cancer Neg Hx   . Esophageal cancer Neg Hx   . Rectal cancer Neg Hx   . Stomach cancer Neg Hx     Social History Social History   Tobacco Use  . Smoking status: Never Smoker  . Smokeless tobacco: Never Used  Substance Use Topics  .  Alcohol use: No    Alcohol/week: 0.0 standard drinks  . Drug use: No      Allergies   Ativan [lorazepam], Other, Azithromycin, Benadryl [diphenhydramine hcl], Ciprofloxacin hcl, Codeine phosphate, Hydrocodone, Hydrocodone-homatropine, Levaquin [levofloxacin in d5w], Levofloxacin, Meperidine hcl, Molds & smuts, Sulfa antibiotics, Tape, Tramadol, and Ultram [tramadol hcl]   Review of Systems Review of Systems  Constitutional: Negative for fatigue and fever.  Eyes: Negative for visual disturbance.  Respiratory: Negative for shortness of breath.   Cardiovascular: Negative for chest pain.  Gastrointestinal: Negative for abdominal pain, nausea and vomiting.  Musculoskeletal: Positive for arthralgias and gait problem. Negative for joint swelling.  Skin: Positive for color change. Negative for rash and wound.  Neurological: Negative for dizziness, weakness, light-headedness and headaches.     Physical Exam Triage Vital Signs ED Triage Vitals  Enc Vitals Group     BP 12/14/19 1556 136/77     Pulse Rate 12/14/19 1556 90     Resp 12/14/19 1556 (!) 22     Temp 12/14/19 1556 99.5 F (37.5 C)     Temp Source 12/14/19 1556 Oral     SpO2 12/14/19 1556 99 %     Weight --      Height --      Head Circumference --      Peak Flow --      Pain Score 12/14/19 1553 3     Pain Loc --      Pain Edu? --      Excl. in [place]? --    No data found.  Updated Vital Signs BP 136/77 (BP Location: Right Arm)   Pulse 90   Temp 99.5 F (37.5 C) (Oral)   Resp (!) 22   SpO2 99%   Visual Acuity Right Eye Distance:   Left Eye Distance:   Bilateral Distance:    Right Eye Near:   Left Eye Near:    Bilateral Near:     Physical Exam Vitals and nursing note reviewed.  Constitutional:      Appearance: She is well-developed.     Comments: No acute distress  HENT:     Head: Normocephalic and atraumatic.     Nose: Nose normal.  Eyes:     Conjunctiva/sclera: Conjunctivae normal.  Cardiovascular:     Rate and Rhythm: Normal rate.  Pulmonary:     Effort: Pulmonary effort  is normal. No respiratory distress.  Abdominal:     General: There is no distension.  Musculoskeletal:        General: Normal range of motion.     Cervical back: Neck supple.     Comments: Left foot: No obvious swelling or deformity, faint warmth and erythema noted to medial aspect of left dorsum of foot, dorsalis pedis 2+, no palpable edema No tenderness or swelling extending into the ankle or calf, negative Homans on left, no popliteal tenderness  Ambulating with antalgia with walker  Skin:    General: Skin is warm and dry.  Neurological:     Mental Status: She is alert and oriented to person, place, and time.      UC Treatments / Results  Labs (all labs ordered are listed, but only abnormal results are displayed) Labs Reviewed - No data to display  EKG   Radiology No results found.  Procedures Procedures (including critical care time)  Medications Ordered in UC Medications - No data to display  Initial Impression / Assessment and Plan / UC Course  I have reviewed  the triage vital signs and the nursing notes.  Pertinent labs & imaging results that were available during my care of the patient were reviewed by me and considered in my medical decision making (see chart for details).     Left foot pain-possible early cellulitis, given recent admission, will go ahead and treat for cellulitis and prescribed clindamycin, has multiple antibiotic allergies.  Tylenol for mild to moderate pain, oxycodone for severe pain, registry checked, as tolerated previously.  Patient has been taking Eliquis, known DVT, deferring repeating ultrasound at this time as she has not had significant swelling or any swelling in the calf.  Advised if symptoms progressing or worsening may need to repeat and to return.  Elevate leg.  Discussed strict return precautions. Patient verbalized understanding and is agreeable with plan.  Final Clinical Impressions(s) / UC Diagnoses   Final diagnoses:  Foot  pain, left  Cellulitis of left lower extremity     Discharge Instructions     Begin clindamycin every 8 hours for the next week Tylenol for mild to moderate pain Oxycodone for severe pain Continue Eliquis as prescribed Follow-up here or emergency room if developing any increased swelling pain or redness to leg   ED Prescriptions    Medication Sig Dispense Auth. Provider   clindamycin (CLEOCIN) 300 MG capsule Take 1 capsule (300 mg total) by mouth 3 (three) times daily for 7 days. 21 capsule Wieters, Hallie C, PA-C   oxyCODONE (OXY IR/ROXICODONE) 5 MG immediate release tablet Take 1 tablet (5 mg total) by mouth every 6 (six) hours as needed for severe pain. 10 tablet Wieters, [place] C, PA-C     I have reviewed the PDMP during this encounter.   Janith Lima, [place] 12/15/19 1822

## 2019-12-15 NOTE — Telephone Encounter (Signed)
Patient seen in [facility] on 12/14/19  Who Is Calling Patient / Member / Family / Caregiver Call Type Triage / Clinical Relationship To Patient Self Return Phone Number [phone] (Primary) Chief Complaint Foot Pain Reason for Call Symptomatic / Request for Health Information Initial Comment Caller states she is taking Eliquis and she recently was released from the hospital. Caller states she is having pain in her foot and it is swollen. Caller states she has sepsis of the bone and she needs antibiotics. Translation No Nurse Assessment Nurse: Doy Mince, RN, [place] Ginger Date/Time (Eastern Time): 12/14/2019 12:06:17 PM Confirm and document reason for call. If symptomatic, describe symptoms. ---Caller states she is taking Eliquis and she recently was released from the hospital. Caller states she is having pain in her left foot and it is swollen. Caller states she has sepsis of the bone and she needs antibiotics. Caller states she was put on antibiotics, been home from the hospital for 3 weeks, was better and now is worse.  Disp. Time Eilene Ghazi Time) Disposition Final User 12/14/2019 12:14:13 PM See HCP within 4 Hours (or PCP triage) Yes Doy Mince, RN, Secund[place] Ginger

## 2019-12-15 NOTE — Telephone Encounter (Signed)
[place]  Call Back # [phone]  Patient states septic foot , patient states her bone in her foot hurts and would like Dr Charlett Blake to know that she was seen over the weekend at urgent care.

## 2019-12-16 ENCOUNTER — Other Ambulatory Visit: Payer: Self-pay | Admitting: *Deleted

## 2019-12-16 ENCOUNTER — Telehealth: Payer: Self-pay

## 2019-12-16 ENCOUNTER — Other Ambulatory Visit: Payer: Self-pay | Admitting: Family Medicine

## 2019-12-16 DIAGNOSIS — I083 Combined rheumatic disorders of mitral, aortic and tricuspid valves: Secondary | ICD-10-CM | POA: Diagnosis not present

## 2019-12-16 DIAGNOSIS — M85872 Other specified disorders of bone density and structure, left ankle and foot: Secondary | ICD-10-CM | POA: Diagnosis not present

## 2019-12-16 DIAGNOSIS — M545 Low back pain: Secondary | ICD-10-CM | POA: Diagnosis not present

## 2019-12-16 DIAGNOSIS — I82402 Acute embolism and thrombosis of unspecified deep veins of left lower extremity: Secondary | ICD-10-CM | POA: Diagnosis not present

## 2019-12-16 DIAGNOSIS — M810 Age-related osteoporosis without current pathological fracture: Secondary | ICD-10-CM | POA: Diagnosis not present

## 2019-12-16 DIAGNOSIS — E785 Hyperlipidemia, unspecified: Secondary | ICD-10-CM | POA: Diagnosis not present

## 2019-12-16 DIAGNOSIS — K219 Gastro-esophageal reflux disease without esophagitis: Secondary | ICD-10-CM | POA: Diagnosis not present

## 2019-12-16 DIAGNOSIS — E876 Hypokalemia: Secondary | ICD-10-CM | POA: Diagnosis not present

## 2019-12-16 DIAGNOSIS — K5790 Diverticulosis of intestine, part unspecified, without perforation or abscess without bleeding: Secondary | ICD-10-CM | POA: Diagnosis not present

## 2019-12-16 DIAGNOSIS — D72829 Elevated white blood cell count, unspecified: Secondary | ICD-10-CM | POA: Diagnosis not present

## 2019-12-16 DIAGNOSIS — D509 Iron deficiency anemia, unspecified: Secondary | ICD-10-CM | POA: Diagnosis not present

## 2019-12-16 DIAGNOSIS — M47812 Spondylosis without myelopathy or radiculopathy, cervical region: Secondary | ICD-10-CM | POA: Diagnosis not present

## 2019-12-16 DIAGNOSIS — E538 Deficiency of other specified B group vitamins: Secondary | ICD-10-CM | POA: Diagnosis not present

## 2019-12-16 DIAGNOSIS — M152 Bouchard's nodes (with arthropathy): Secondary | ICD-10-CM | POA: Diagnosis not present

## 2019-12-16 DIAGNOSIS — M170 Bilateral primary osteoarthritis of knee: Secondary | ICD-10-CM | POA: Diagnosis not present

## 2019-12-16 DIAGNOSIS — M19041 Primary osteoarthritis, right hand: Secondary | ICD-10-CM | POA: Diagnosis not present

## 2019-12-16 DIAGNOSIS — M2578 Osteophyte, vertebrae: Secondary | ICD-10-CM | POA: Diagnosis not present

## 2019-12-16 DIAGNOSIS — M79672 Pain in left foot: Secondary | ICD-10-CM

## 2019-12-16 DIAGNOSIS — M542 Cervicalgia: Secondary | ICD-10-CM

## 2019-12-16 DIAGNOSIS — M151 Heberden's nodes (with arthropathy): Secondary | ICD-10-CM | POA: Diagnosis not present

## 2019-12-16 DIAGNOSIS — M19011 Primary osteoarthritis, right shoulder: Secondary | ICD-10-CM | POA: Diagnosis not present

## 2019-12-16 DIAGNOSIS — F419 Anxiety disorder, unspecified: Secondary | ICD-10-CM | POA: Diagnosis not present

## 2019-12-16 DIAGNOSIS — M25572 Pain in left ankle and joints of left foot: Secondary | ICD-10-CM

## 2019-12-16 DIAGNOSIS — I10 Essential (primary) hypertension: Secondary | ICD-10-CM | POA: Diagnosis not present

## 2019-12-16 DIAGNOSIS — M4312 Spondylolisthesis, cervical region: Secondary | ICD-10-CM | POA: Diagnosis not present

## 2019-12-16 DIAGNOSIS — M19031 Primary osteoarthritis, right wrist: Secondary | ICD-10-CM | POA: Diagnosis not present

## 2019-12-16 DIAGNOSIS — M4802 Spinal stenosis, cervical region: Secondary | ICD-10-CM | POA: Diagnosis not present

## 2019-12-16 NOTE — Telephone Encounter (Signed)
Nurse Assessment Nurse: Terence Lux, RN, [place] Date/Time (Eastern Time): 12/15/2019 8:45:10 PM Confirm and document reason for call. If symptomatic, describe symptoms. ---Caller states that mother went to [facility] yesterday for joint pain, Clindamycin 300 mg. Left foot is tender to touch and unable to walk, Now neck is sore. Patient called office around noon today and did not get a call back. She started taking the Clindamycin yesterday and has pain medication available. Has the patient had close contact with a person known or suspected to have the novel coronavirus illness OR traveled / lives in area with major community spread (including international travel) in the last 14 days from the onset of symptoms? * If Asymptomatic, screen for exposure and travel within the last 14 days. ---No Does the patient have any new or worsening symptoms? ---No Please document clinical information provided and list any resource used. ---Daughter would like Dr Charlett Blake to follow up with patient to make sure this is an adequate dose of medication and the appropriate treatment. Very concerned because she has a history of being septic. Disp. Time Eilene Ghazi Time) Disposition Final User 12/15/2019 8:54:29 PM Clinical Call Yes Terence Lux, RN, Ashok Norris

## 2019-12-16 NOTE — Telephone Encounter (Signed)
Please call daughter back at lunch.  Alexis Olson  [phone]  Spoke with daughter and she is concerned about sepsis and wants to make sure that she is on correct antibiotic.  Tried to call patient to get in for 840 appt but no answer then called daughter.  Daughter stated that Dr. Charlett Blake needed to speak her instead of pt.  Also she stated mom called yesterday at 12pm but no phone message was taken ([place] phone message was taken around 12 on 12/19 which was Sunday).

## 2019-12-16 NOTE — Telephone Encounter (Signed)
Patient went to urgent care for left foot pain/cellulitis. She can have VO for functional PT as requested. Spoke with patient's daughter today and confirmed Clindamycin is a good choice for her antibiotic for cellulitis and she is taking a probiotic daily. Her left foot pain persists and her neck hurts. She did have a fall today without obvious new injury. Have ordered xrays of neck/ankle/foot as well as cmp, cbc with diff and sed rate at [facility] and then will see patient in office on 12/18/2019

## 2019-12-16 NOTE — Telephone Encounter (Signed)
Received Phone call from [place] and calling to report,  This patient fell today (9/21-am), no injury, but required a fireman to get her up.  Secondly, also requesting a Dx given when seen at Urgent Care (clarification)  Lastly, patient is out of Home Health-Physical Therapy visits, and requesting Verbal Orders for [place] for functional mobility and frequency 2x week for 1-week, and 1x for3 weeks.   Per [facility] [phone])

## 2019-12-17 ENCOUNTER — Telehealth: Payer: Self-pay | Admitting: Family Medicine

## 2019-12-17 ENCOUNTER — Other Ambulatory Visit: Payer: Self-pay

## 2019-12-17 ENCOUNTER — Other Ambulatory Visit (INDEPENDENT_AMBULATORY_CARE_PROVIDER_SITE_OTHER): Payer: PPO

## 2019-12-17 ENCOUNTER — Ambulatory Visit (INDEPENDENT_AMBULATORY_CARE_PROVIDER_SITE_OTHER)
Admission: RE | Admit: 2019-12-17 | Discharge: 2019-12-17 | Disposition: A | Discharge status: Home / Self Care | Payer: PPO | Source: Ambulatory Visit | Attending: Family Medicine | Admitting: Family Medicine

## 2019-12-17 DIAGNOSIS — M79672 Pain in left foot: Secondary | ICD-10-CM | POA: Diagnosis not present

## 2019-12-17 DIAGNOSIS — M7989 Other specified soft tissue disorders: Secondary | ICD-10-CM | POA: Diagnosis not present

## 2019-12-17 DIAGNOSIS — M7732 Calcaneal spur, left foot: Secondary | ICD-10-CM | POA: Diagnosis not present

## 2019-12-17 DIAGNOSIS — M25572 Pain in left ankle and joints of left foot: Secondary | ICD-10-CM | POA: Diagnosis not present

## 2019-12-17 DIAGNOSIS — M542 Cervicalgia: Secondary | ICD-10-CM | POA: Diagnosis not present

## 2019-12-17 LAB — CBC WITH DIFFERENTIAL/PLATELET
Basophils Absolute: 0 10*3/uL (ref 0.0–0.1)
Basophils Relative: 0.4 % (ref 0.0–3.0)
Eosinophils Absolute: 0.2 10*3/uL (ref 0.0–0.7)
Eosinophils Relative: 2.2 % (ref 0.0–5.0)
HCT: 35.1 % — ABNORMAL LOW (ref 36.0–46.0)
Hemoglobin: 11.8 g/dL — ABNORMAL LOW (ref 12.0–15.0)
Lymphocytes Relative: 27.2 % (ref 12.0–46.0)
Lymphs Abs: 1.9 10*3/uL (ref 0.7–4.0)
MCHC: 33.7 g/dL (ref 30.0–36.0)
MCV: 90.1 fl (ref 78.0–100.0)
Monocytes Absolute: 0.6 10*3/uL (ref 0.1–1.0)
Monocytes Relative: 9.2 % (ref 3.0–12.0)
Neutro Abs: 4.3 10*3/uL (ref 1.4–7.7)
Neutrophils Relative %: 61 % (ref 43.0–77.0)
Platelets: 350 10*3/uL (ref 150.0–400.0)
RBC: 3.89 Mil/uL (ref 3.87–5.11)
RDW: 16.7 % — ABNORMAL HIGH (ref 11.5–15.5)
WBC: 7 10*3/uL (ref 4.0–10.5)

## 2019-12-17 LAB — COMPREHENSIVE METABOLIC PANEL
ALT: 24 U/L (ref 0–35)
AST: 16 U/L (ref 0–37)
Albumin: 3.8 g/dL (ref 3.5–5.2)
Alkaline Phosphatase: 58 U/L (ref 39–117)
BUN: 11 mg/dL (ref 6–23)
CO2: 30 mEq/L (ref 19–32)
Calcium: 9.4 mg/dL (ref 8.4–10.5)
Chloride: 99 mEq/L (ref 96–112)
Creatinine, Ser: 0.67 mg/dL (ref 0.40–1.20)
GFR: 83.73 mL/min (ref >60.00)
Glucose, Bld: 116 mg/dL — ABNORMAL HIGH (ref 70–99)
Potassium: 4 mEq/L (ref 3.5–5.1)
Sodium: 135 mEq/L (ref 135–145)
Total Bilirubin: 0.3 mg/dL (ref 0.2–1.2)
Total Protein: 7.2 g/dL (ref 6.0–8.3)

## 2019-12-17 LAB — URIC ACID: Uric Acid, Serum: 3.4 mg/dL (ref 2.4–7.0)

## 2019-12-17 LAB — SEDIMENTATION RATE: Sed Rate: 68 mm/hr — ABNORMAL HIGH (ref 0–30)

## 2019-12-17 MED FILL — VIT D2 1.25 MG (50,000 UNIT: 1.25 MG | 84 days supply | Qty: 12 | Fill #1

## 2019-12-17 MED FILL — POTASSIUM CHLORIDE CRYS ER: 20 | 90 days supply | Qty: 90 | Fill #1

## 2019-12-17 NOTE — Telephone Encounter (Signed)
[facility] has been notified of VO -PT as requested.

## 2019-12-17 NOTE — Telephone Encounter (Signed)
Medication: apixaban (ELIQUIS) 5 MG TABS tablet    Has the patient contacted their pharmacy? No. (If no, request that the patient contact the pharmacy for the refill.) (If yes, when and what did the pharmacy advise?)  Preferred Pharmacy (with phone number or street name):   [place], [place] - [address]  [place], [place] [address]  Phone:  [phone] Fax:  [phone]  DEA #:  -- Agent: Please be advised that RX refills may take up to 3 business days. We ask that you follow-up with your pharmacy.

## 2019-12-18 ENCOUNTER — Ambulatory Visit (INDEPENDENT_AMBULATORY_CARE_PROVIDER_SITE_OTHER): Payer: PPO | Admitting: Family Medicine

## 2019-12-18 ENCOUNTER — Other Ambulatory Visit: Payer: Self-pay

## 2019-12-18 ENCOUNTER — Other Ambulatory Visit: Payer: Self-pay | Admitting: Family Medicine

## 2019-12-18 VITALS — BP 128/67 | HR 72 | Temp 99.0°F | Resp 13 | Ht 65.0 in

## 2019-12-18 DIAGNOSIS — S92255A Nondisplaced fracture of navicular [scaphoid] of left foot, initial encounter for closed fracture: Secondary | ICD-10-CM

## 2019-12-18 DIAGNOSIS — L0390 Cellulitis, unspecified: Secondary | ICD-10-CM

## 2019-12-18 DIAGNOSIS — I10 Essential (primary) hypertension: Secondary | ICD-10-CM | POA: Diagnosis not present

## 2019-12-18 DIAGNOSIS — M542 Cervicalgia: Secondary | ICD-10-CM | POA: Diagnosis not present

## 2019-12-18 DIAGNOSIS — I82402 Acute embolism and thrombosis of unspecified deep veins of left lower extremity: Secondary | ICD-10-CM | POA: Diagnosis not present

## 2019-12-18 MED ORDER — CLINDAMYCIN HCL 300 MG PO CAPS: 300.0000 mg | ORAL_CAPSULE | Freq: Three times a day (TID) | ORAL | 0 refills | Status: AC

## 2019-12-18 MED ORDER — APIXABAN 5 MG PO TABS
5.0000 mg | ORAL_TABLET | Freq: Two times a day (BID) | ORAL | 3 refills | Status: DC
Start: 2019-12-18 — End: 2020-05-24

## 2019-12-18 MED ORDER — APIXABAN 5 MG PO TABS
5.0000 mg | ORAL_TABLET | Freq: Two times a day (BID) | ORAL | 3 refills | Status: DC
Start: 2019-12-18 — End: 2019-12-18

## 2019-12-18 MED FILL — ELIQUIS 5 MG TABLET: 5 | 30 days supply | Qty: 60 | Fill #0

## 2019-12-18 NOTE — Patient Instructions (Signed)
Tarsal Navicular Fracture  A tarsal navicular fracture is a break in a bone in the top middle area of the foot. The group of bones in the feet are called the tarsal bones. The navicular bone is wedged between other tarsal bones. Tarsal navicular fractures occur most often in athletes because running and jumping put a lot of pressure on the navicular bone. What are the causes? This condition may be caused by:  Severe twisting of the foot.  Something heavy falling on the foot.  Wear and tear on the bone from the foot striking the ground repeatedly (stress fracture). What increases the risk? You may be more likely to develop this condition if you:  Do high-impact activities, such as: ? Track and field. ? Football. ? Soccer. ? Basketball. ? Gymnastics. ? Ballet dancing.  Are a female who has an irregular menstrual cycle.  Have a condition that causes your bones to become thin and brittle (osteoporosis).  Smoke.  Start a new sport without being in good shape.  Wear athletic shoes that do not fit well. What are the signs or symptoms? Symptoms may include:  Swelling on the top of your foot.  Pain at the top of your foot. The pain may move down into the arch of your foot.  Pain when pressing on the top of your foot.  Pain when hopping on your foot.  Pain that gets worse with activity and better with rest. How is this diagnosed? This condition may be diagnosed based on:  Your symptoms.  Any recent foot injuries you have had.  A physical exam.  An X-ray of your foot. If you have a stress fracture, it may not show up on an X-ray, and you may need other imaging tests, such as: ? A bone scan. ? CT scan. ? MRI. How is this treated? Treatment depends on how severe your fracture is and how the pieces of the broken bone line up with each other (alignment).  If your broken bone is in good alignment, you will need to wear a cast or splint for at least 6 weeks. While wearing  the cast or splint, you will not be able to put any weight on your foot. You will be given crutches.  If your fracture is severe and the broken bone is not aligned (is displaced), you may need surgery to align the fracture (open reduction and internal fixation, ORIF). This is rare. During surgery, the bone pieces are fixed into place with metal screws or pins. Treatment will also include having follow-up visits with your health care provider to make sure you are healing. Follow these instructions at home: If you have a splint:  Wear the splint as told by your health care provider. Remove it only as told by your health care provider.  Loosen the splint if your toes tingle, become numb, or turn cold and blue.  Keep the splint clean and dry. If you have a cast:  Do not stick anything inside the cast to scratch your skin. Doing that increases your risk for infection.  Check the skin around the cast every day. Tell your health care provider about any concerns.  You may put lotion on dry skin around the edges of the cast. Do not put lotion on the skin underneath the cast.  Keep the cast clean and dry. Bathing  Do not take baths, swim, or use a hot tub until your health care provider approves. Ask your health care provider if you may  take showers. You may only be allowed to take sponge baths.  If your splint or cast is not waterproof: ? Do not let it get wet. ? Cover it with a watertight covering when you take a bath or a shower. Activity  Do not use your affected leg to support your body weight until your health care provider says that you can. Use crutches as directed.  Ask your health care provider what activities are safe for you during recovery, and what activities you need to avoid. Managing pain, stiffness, and swelling  If directed, put ice on painful areas: ? If you have a removable splint, remove it as told by your health care provider. ? Put ice in a plastic bag. ? Place a  towel between your skin and the bag, or between your cast and the bag. ? Leave the ice on for 20 minutes, 2-3 times a day.  Move your toes often to avoid stiffness and to lessen swelling.  Raise (elevate) your lower leg above the level of your heart while you are sitting or lying down, when possible. General instructions  Do not put pressure on any part of the cast or splint until it is fully hardened, if applicable. This may take several hours.  Take over-the-counter and prescription medicines only as told by your health care provider.  Do not drive until your health care provider approves. You should not drive or use heavy machinery while taking prescription pain medicine.  Do not use any products that contain nicotine or tobacco, such as cigarettes and e-cigarettes. These can delay bone healing. If you need help quitting, ask your health care provider.  Keep all follow-up visits as told by your health care provider. This is important. Contact a health care provider if you have:  Pain that gets worse or does not get better with medicine.  A fever.  A bad smell coming from your cast or splint. Get help right away if you have:  Any of the following in your toes or your foot, even after loosening your splint (if applicable): ? Numbness. ? Tingling. ? Coldness. ? Blue skin.  Redness or swelling that gets worse.  Pain that suddenly becomes severe. Summary  A tarsal navicular fracture is a break in a bone in the top middle area of the foot.  Tarsal navicular fractures occur most often in athletes because running and jumping put a lot of pressure on the navicular bone.  Do not use your affected leg to support your body weight until your health care provider says that you can.  Keep all follow-up visits as told by your health care provider. This is important. This information is not intended to replace advice given to you by your health care provider. Make sure you discuss any  questions you have with your health care provider. Document Revised: 04/26/2017 Document Reviewed: 04/24/2017 Elsevier Patient Education  2020 [employer].

## 2019-12-19 ENCOUNTER — Telehealth: Payer: Self-pay | Admitting: Family Medicine

## 2019-12-19 NOTE — Telephone Encounter (Signed)
Caller: Mylea Call back # [phone]  Patient would like to know if she should continue physical therapy. Patient would not give me any other information. Patient states Dr.Blyth knows.

## 2019-12-19 NOTE — Telephone Encounter (Signed)
Patient would like to know if you still want her to continue physical therapy.

## 2019-12-20 DIAGNOSIS — L0390 Cellulitis, unspecified: Secondary | ICD-10-CM | POA: Insufficient documentation

## 2019-12-20 DIAGNOSIS — M542 Cervicalgia: Secondary | ICD-10-CM | POA: Insufficient documentation

## 2019-12-20 DIAGNOSIS — S92253A Displaced fracture of navicular [scaphoid] of unspecified foot, initial encounter for closed fracture: Secondary | ICD-10-CM | POA: Insufficient documentation

## 2019-12-20 NOTE — Assessment & Plan Note (Signed)
Worse when stiffens at night. Better during the day. Encouraged moist heat and gentle stretching as tolerated. May try NSAIDs and prescription meds as directed and report if symptoms worsen or seek immediate care. Xray confirms only degenerative changes.

## 2019-12-20 NOTE — Assessment & Plan Note (Signed)
Well controlled, no changes to meds. Encouraged heart healthy diet such as the DASH diet and exercise as tolerated.  °

## 2019-12-20 NOTE — Telephone Encounter (Signed)
Can do upper body work with PT but avoid weight bearing until cleared by ortho due to navicular fracture.

## 2019-12-20 NOTE — Progress Notes (Addendum)
Subjective:    Patient ID: Alexis Olson, female    DOB: 03/04/1934, 86 y.o.   MRN: [id number]  Chief Complaint  Patient presents with  . Follow-up    left foot-cellulitis    HPI Patient is in today for follow up on ankle pain. She had xrays yesterday which confirmed Navicular fratcure of left ankle along medial aspect. She fell and injured it and has pain and swelling since then. There was some superficial cellulitis but it has responded to antibiotics. No fevers or chills. She also notes some increased stiffness and pain in her neck worst at night when it stiffens up. No worsening headaches or other new concerns. Her ankle swells. Denies CP/palp/SOB/HA/congestion/fevers/GI or GU c/o. Taking meds as prescribed  Past Medical History:  Diagnosis Date  . ALLERGIC RHINITIS   . ANEMIA, CHRONIC    Malabsorption related to bypass hx, B12 and iron deficiency  . ANXIETY   . Arthritis   . ASTHMA   . Bariatric surgery status   . Bowel perforation ([place]) 2016  . C. difficile diarrhea   . Colon perforation ([place]) 08/2014   following polypetomy during colo, surgical repair  . COLONIC POLYPS, ADENOMATOUS, HX OF 2010  . DIVERTICULOSIS, COLON   . DVT (deep venous thrombosis) ([place]) 2018  . DVT (deep venous thrombosis) ([place])   . GERD (gastroesophageal reflux disease) 08/23/2015  . Graded compression stocking in place   . History of blood transfusion   . History of migraine   . History of rheumatic fever   . HSV (herpes simplex virus) infection 08/16/2015  . Hyperlipidemia   . Hypertension    Denies  . Hyponatremia 05/02/2016  . Incomplete left bundle branch block (LBBB) 2016   Noted on EKG  . INSOMNIA   . Lumbosacral disc disease    Chronic pain; History of osteomyelitis 2010 following ESI complication  . Obesity   . Osteomyelitis ([place])   . Pelvic fracture ([place])   . Pneumonia   . Recurrent UTI 08/23/2015  . URINARY URGENCY   . Ventral hernia without obstruction or gangrene 05/22/2018  .  VITAMIN B12 DEFICIENCY     Past Surgical History:  Procedure Laterality Date  . [place]   for ruptured disc  . CHOLECYSTECTOMY    . COLONOSCOPY    . greenfield filter    . ileojejunal bypass  1976   for obesity  . LAPAROTOMY N/A 09/18/2014   Procedure: EXPLORATORY LAPAROTOMY WITH REPAIR OF CECIAL PERFORATION;  Surgeon: Autumn Messing III, MD;  Location: [facility];  Service: General;  Laterality: N/A;  . TONSILLECTOMY    . TOTAL KNEE ARTHROPLASTY Right 08/26/2019   Procedure: RIGHT TOTAL KNEE ARTHROPLASTY, LEFT KNEE CORTISONE INJECTION;  Surgeon: Paralee Cancel, MD;  Location: [facility];  Service: Orthopedics;  Laterality: Right;  70 MINS  . UPPER GI ENDOSCOPY    . VASCULAR SURGERY Right    revascularization right lower extremity    Family History  Problem Relation Age of Onset  . Cervical cancer Mother   . Stroke Mother   . Liver disease Sister   . Kidney disease Sister   . Cirrhosis Sister   . Colon cancer Neg Hx   . Esophageal cancer Neg Hx   . Rectal cancer Neg Hx   . Stomach cancer Neg Hx     Social History   Socioeconomic History  . Marital status: Married    Spouse name: Not on file  . Number of children:  3  . Years of education: Not on file  . Highest education level: Not on file  Occupational History  . Occupation: retired Programmer, multimedia: RETIRED  Tobacco Use  . Smoking status: Never Smoker  . Smokeless tobacco: Never Used  Substance and Sexual Activity  . Alcohol use: No    Alcohol/week: 0.0 standard drinks  . Drug use: No  . Sexual activity: Never    Partners: Male  Other Topics Concern  . Not on file  Social History Narrative   Lives with spouse, Indep ADLs   Supportive family nearby   No dietary restrictions   Social Determinants of Health   Financial Resource Strain:   . Difficulty of Paying Living Expenses: Not on file  Food Insecurity:   . Worried About Charity fundraiser in the Last Year: Not on file  . Ran Out of Food in the Last Year:  Not on file  Transportation Needs:   . Lack of Transportation (Medical): Not on file  . Lack of Transportation (Non-Medical): Not on file  Physical Activity:   . Days of Exercise per Week: Not on file  . Minutes of Exercise per Session: Not on file  Stress:   . Feeling of Stress : Not on file  Social Connections:   . Frequency of Communication with Friends and Family: Not on file  . Frequency of Social Gatherings with Friends and Family: Not on file  . Attends Religious Services: Not on file  . Active Member of Clubs or Organizations: Not on file  . Attends Archivist Meetings: Not on file  . Marital Status: Not on file  Intimate Partner Violence:   . Fear of Current or Ex-Partner: Not on file  . Emotionally Abused: Not on file  . Physically Abused: Not on file  . Sexually Abused: Not on file    Outpatient Medications Prior to Visit  Medication Sig Dispense Refill  . acetaminophen (TYLENOL) 500 MG tablet Take 2 tablets (1,000 mg total) by mouth every 8 (eight) hours. 30 tablet 0  . albuterol (PROVENTIL HFA;VENTOLIN HFA) 108 (90 BASE) MCG/ACT inhaler Inhale 1-2 puffs into the lungs every 6 (six) hours as needed for wheezing or shortness of breath.    . ALPRAZolam (XANAX) 1 MG tablet Take 0.5-1 tablets (0.5-1 mg total) by mouth at bedtime as needed. 90 tablet 1  . amitriptyline (ELAVIL) 100 MG tablet Take 1 tablet (100 mg total) by mouth daily. 90 tablet 1  . azelastine (OPTIVAR) 0.05 % ophthalmic solution Place 1 drop into both eyes daily as needed (for dry eyes).     . benzonatate (TESSALON) 100 MG capsule Take 1 capsule (100 mg total) by mouth 3 (three) times daily as needed for cough. 30 capsule 0  . budesonide-formoterol (SYMBICORT) 160-4.5 MCG/ACT inhaler Inhale 2 puffs into the lungs See admin instructions. Inhale 2 puffs into the lungs one to two times a day (Patient taking differently: Inhale 2 puffs into the lungs in the morning and at bedtime. Inhale 2 puffs into the  lungs one to two times a day) 1 Inhaler 2  . cetirizine (ZYRTEC) 10 MG tablet Take 10 mg by mouth daily.     . cyanocobalamin (,VITAMIN B-12,) 1000 MCG/ML injection Inject 1cc into the muscle every month 10 mL 1  . dicyclomine (BENTYL) 10 MG capsule TAKE 1 CAPSULE BY MOUTH 3 TIMES DAILY AS NEEDED FOR DIARRHEA AND SYMPTOMS 270 capsule 3  . diphenoxylate-atropine (LOMOTIL) 2.5-0.025 MG  tablet Take 1 tablet by mouth 4 (four) times daily as needed for diarrhea or loose stools. 90 tablet 1  . docusate sodium (COLACE) 100 MG capsule Take 1 capsule (100 mg total) by mouth 2 (two) times daily. (Patient taking differently: Take 100 mg by mouth daily as needed for mild constipation. ) 28 capsule 0  . Fe Fum-FA-B Cmp-C-Zn-Mg-Mn-Cu (HEMOCYTE PLUS) 106-1 MG CAPS TAKE 1 CAPSULE BY MOUTH 2 TIMES DAILY. (Patient taking differently: Take 1 capsule by mouth in the morning and at bedtime. ) 60 capsule 1  . ferrous sulfate 325 (65 FE) MG tablet Take 1 tablet (325 mg total) by mouth 2 (two) times daily with a meal. 60 tablet 0  . fluticasone (FLONASE) 50 MCG/ACT nasal spray 2 sprays by Nasal route daily.      Marland Kitchen gabapentin (NEURONTIN) 300 MG capsule TAKE 1 CAPSULE BY MOUTH AT BEDTIME (Patient taking differently: Take 300 mg by mouth at bedtime. ) 90 capsule 1  . hydrOXYzine (ATARAX) 25 MG tablet Take 25 mg by mouth 3 (three) times daily as needed for itching.     . levocetirizine (XYZAL) 5 MG tablet Take 1 tablet (5 mg total) by mouth every evening. 90 tablet 3  . lubiprostone (AMITIZA) 24 MCG capsule Take 24 mcg by mouth daily as needed for constipation.    . magnesium oxide (MAG-OX 400) 400 MG tablet Take 400 mg by mouth 2 (two) times daily.     . methocarbamol (ROBAXIN) 500 MG tablet Take 1 tablet (500 mg total) by mouth every 6 (six) hours as needed for muscle spasms. 40 tablet 0  . montelukast (SINGULAIR) 10 MG tablet Take 10 mg by mouth daily as needed (For allergy).     Marland Kitchen omeprazole (PRILOSEC) 20 MG capsule TAKE 1  CAPSULE BY MOUTH DAILY. (Patient taking differently: Take 20 mg by mouth daily. ) 90 capsule 3  . ondansetron (ZOFRAN-ODT) 4 MG disintegrating tablet Take 1 tablet (4 mg total) by mouth 3 (three) times daily as needed for nausea or vomiting. 20 tablet 0  . oxyCODONE (OXY IR/ROXICODONE) 5 MG immediate release tablet Take 1 tablet (5 mg total) by mouth every 6 (six) hours as needed for severe pain. 10 tablet 0  . potassium chloride SA (KLOR-CON) 20 MEQ tablet TAKE 1 TABLET BY MOUTH ONCE DAILY (Patient taking differently: Take 20 mEq by mouth daily. ) 90 tablet 1  . Probiotic Product (PROBIOTIC DAILY PO) Take 1 capsule by mouth daily.    . tamsulosin (FLOMAX) 0.4 MG CAPS capsule Take 1 capsule (0.4 mg total) by mouth daily after supper. 30 capsule 0  . Vitamin D, Ergocalciferol, (DRISDOL) 1.25 MG (50000 UNIT) CAPS capsule TAKE 1 CAPSULE (50,000 UNITS TOTAL) BY MOUTH EVERY 7 (SEVEN) DAYS. 12 capsule 4  . zolpidem (AMBIEN) 5 MG tablet TAKE 1 TABLET BY MOUTH AT BEDTIME (Patient taking differently: Take 5 mg by mouth at bedtime. ) 90 tablet 1  . apixaban (ELIQUIS) 5 MG TABS tablet Take 1 tablet (5 mg total) by mouth 2 (two) times daily. 60 tablet 0  . clindamycin (CLEOCIN) 300 MG capsule Take 1 capsule (300 mg total) by mouth 3 (three) times daily for 7 days. 21 capsule 0   No facility-administered medications prior to visit.    Allergies  Allergen Reactions  . Ativan [Lorazepam] Anaphylaxis and Other (See Comments)    Deathly allergic; "resp rate dropped dropped to 6"  . Other Anaphylaxis    Red peppers--choking, also  . Azithromycin  Diarrhea  . Benadryl [Diphenhydramine Hcl] Other (See Comments)    Paradoxical response  . Ciprofloxacin Hcl Other (See Comments)    Causes yeast infection and refuses to take  . Codeine Phosphate Nausea Only    Can tolerate in limited amounts  . Hydrocodone Itching and Nausea And Vomiting  . Hydrocodone-Homatropine Itching and Nausea And Vomiting  . Levaquin  [Levofloxacin In D5w] Other (See Comments)    Muscle soreness  . Levofloxacin   . Meperidine Hcl Other (See Comments)    Reaction ??  . Molds & Smuts Other (See Comments)    unsure  . Sulfa Antibiotics Other (See Comments)    Joint pain  . Tape Other (See Comments)    No Coban wrap (per the patient)  . Tramadol   . Ultram [Tramadol Hcl] Other (See Comments)    "Can't move joints"    Review of Systems  Constitutional: Positive for malaise/fatigue. Negative for fever.  HENT: Negative for congestion.   Eyes: Negative for blurred vision.  Respiratory: Negative for shortness of breath.   Cardiovascular: Negative for chest pain, palpitations and leg swelling.  Gastrointestinal: Negative for abdominal pain, blood in stool and nausea.  Genitourinary: Negative for dysuria and frequency.  Musculoskeletal: Positive for falls, joint pain and neck pain.  Skin: Negative for rash.  Neurological: Negative for dizziness, loss of consciousness and headaches.  Endo/Heme/Allergies: Negative for environmental allergies.  Psychiatric/Behavioral: Positive for memory loss. Negative for depression. The patient is nervous/anxious.        Objective:    Physical Exam Vitals and nursing note reviewed.  Constitutional:      General: She is not in acute distress.    Appearance: She is well-developed.  HENT:     Head: Normocephalic and atraumatic.     Nose: Nose normal.  Eyes:     General:        Right eye: No discharge.        Left eye: No discharge.  Cardiovascular:     Rate and Rhythm: Normal rate and regular rhythm.     Heart sounds: No murmur heard.   Pulmonary:     Effort: Pulmonary effort is normal.     Breath sounds: Normal breath sounds.  Abdominal:     General: Bowel sounds are normal.     Palpations: Abdomen is soft.     Tenderness: There is no abdominal tenderness.  Musculoskeletal:        General: Swelling, tenderness and signs of injury present.     Cervical back: Normal  range of motion and neck supple.     Right lower leg: No edema.     Left lower leg: Edema present.     Comments: Left ankle swollen and mildly erythematous. Pain to palpation over medial aspect of ankle  Skin:    General: Skin is warm and dry.  Neurological:     Mental Status: She is alert and oriented to person, place, and time.     BP 128/67 (BP Location: Right Arm, Patient Position: Sitting, Cuff Size: Normal)   Pulse 72   Temp 99 F (37.2 C) (Oral)   Resp 13   Ht 5\' 5"  (1.651 m)   SpO2 95%   BMI 28.42 kg/m  Wt Readings from Last 3 Encounters:  11/27/19 170 lb 12.8 oz (77.5 kg)  11/06/19 175 lb (79.4 kg)  09/19/19 180 lb (81.6 kg)    Diabetic Foot Exam - Simple   No data filed  Lab Results  Component Value Date   WBC 7.0 12/17/2019   HGB 11.8 (L) 12/17/2019   HCT 35.1 (L) 12/17/2019   PLT 350.0 12/17/2019   GLUCOSE 116 (H) 12/17/2019   CHOL 151 02/06/2019   TRIG 164.0 (H) 02/06/2019   HDL 49.80 02/06/2019   LDLDIRECT 76.0 10/04/2018   LDLCALC 68 02/06/2019   ALT 24 12/17/2019   AST 16 12/17/2019   NA 135 12/17/2019   K 4.0 12/17/2019   CL 99 12/17/2019   CREATININE 0.67 12/17/2019   BUN 11 12/17/2019   CO2 30 12/17/2019   TSH 1.572 11/07/2019   INR 1.01 05/13/2009   HGBA1C 5.1 11/07/2019    Lab Results  Component Value Date   TSH 1.572 11/07/2019   Lab Results  Component Value Date   WBC 7.0 12/17/2019   HGB 11.8 (L) 12/17/2019   HCT 35.1 (L) 12/17/2019   MCV 90.1 12/17/2019   PLT 350.0 12/17/2019   Lab Results  Component Value Date   NA 135 12/17/2019   K 4.0 12/17/2019   CO2 30 12/17/2019   GLUCOSE 116 (H) 12/17/2019   BUN 11 12/17/2019   CREATININE 0.67 12/17/2019   BILITOT 0.3 12/17/2019   ALKPHOS 58 12/17/2019   AST 16 12/17/2019   ALT 24 12/17/2019   PROT 7.2 12/17/2019   ALBUMIN 3.8 12/17/2019   CALCIUM 9.4 12/17/2019   ANIONGAP 12 11/16/2019   GFR 83.73 12/17/2019   Lab Results  Component Value Date   CHOL 151  02/06/2019   Lab Results  Component Value Date   HDL 49.80 02/06/2019   Lab Results  Component Value Date   LDLCALC 68 02/06/2019   Lab Results  Component Value Date   TRIG 164.0 (H) 02/06/2019   Lab Results  Component Value Date   CHOLHDL 3 02/06/2019   Lab Results  Component Value Date   HGBA1C 5.1 11/07/2019       Assessment & Plan:   Problem List Items Addressed This Visit    Essential hypertension    Well controlled, no changes to meds. Encouraged heart healthy diet such as the DASH diet and exercise as tolerated.       Relevant Medications   apixaban (ELIQUIS) 5 MG TABS tablet   DVT (deep venous thrombosis) (HCC)    Given refill on Eliquis and 4 weeks of samples      Relevant Medications   apixaban (ELIQUIS) 5 MG TABS tablet   Cellulitis - Primary    Has responded to Clindamycin she will finish course and continue probiotics. Is given refills on Clindamycin to use if worsens again.       Relevant Orders   Sedimentation rate   CBC w/Diff   Navicular fracture of ankle    Pain and swelling since fall last week. Xray shows probable left navicular fracture. She is put in a stiff post op boot which was supplied by our office and she is referred to orthopaedics for further management. Ice and elevate foot as much as possible to keep swelling down and use compression hose if able.       Neck pain    Worse when stiffens at night. Better during the day. Encouraged moist heat and gentle stretching as tolerated. May try NSAIDs and prescription meds as directed and report if symptoms worsen or seek immediate care. Xray confirms only degenerative changes.         I am having Renaldo Fiddler. Steptoe maintain her fluticasone, hydrOXYzine, magnesium  oxide, azelastine, montelukast, cetirizine, levocetirizine, albuterol, budesonide-formoterol, benzonatate, omeprazole, gabapentin, Probiotic Product (PROBIOTIC DAILY PO), lubiprostone, docusate sodium, methocarbamol, ondansetron,  acetaminophen, Hemocyte Plus, potassium chloride SA, amitriptyline, Vitamin D (Ergocalciferol), zolpidem, cyanocobalamin, ferrous sulfate, tamsulosin, ALPRAZolam, dicyclomine, diphenoxylate-atropine, oxyCODONE, clindamycin, and apixaban.  Meds ordered this encounter  Medications  . clindamycin (CLEOCIN) 300 MG capsule    Sig: Take 1 capsule (300 mg total) by mouth 3 (three) times daily for 7 days.    Dispense:  21 capsule    Refill:  0  . DISCONTD: apixaban (ELIQUIS) 5 MG TABS tablet    Sig: Take 1 tablet (5 mg total) by mouth 2 (two) times daily.    Dispense:  60 tablet    Refill:  3  . apixaban (ELIQUIS) 5 MG TABS tablet    Sig: Take 1 tablet (5 mg total) by mouth 2 (two) times daily.    Dispense:  60 tablet    Refill:  3     Penni Homans, MD

## 2019-12-20 NOTE — Assessment & Plan Note (Signed)
Has responded to Clindamycin she will finish course and continue probiotics. Is given refills on Clindamycin to use if worsens again.

## 2019-12-20 NOTE — Assessment & Plan Note (Signed)
Given refill on Eliquis and 4 weeks of samples

## 2019-12-20 NOTE — Assessment & Plan Note (Addendum)
Pain and swelling since fall last week. Xray shows probable left navicular fracture. She is put in a stiff post op boot which was supplied by our office and she is referred to orthopaedics for further management. Ice and elevate foot as much as possible to keep swelling down and use compression hose if able.

## 2019-12-22 MED FILL — OMEPRAZOLE 20 MG CAP: 20 | 90 days supply | Qty: 90 | Fill #2

## 2019-12-22 MED FILL — AMITRIPTYLINE HCL 100 MG TA: 100 | 90 days supply | Qty: 90 | Fill #1

## 2019-12-22 NOTE — Telephone Encounter (Signed)
Patient notified PT restrictions, as indicated per provider.

## 2019-12-23 ENCOUNTER — Telehealth: Payer: Self-pay | Admitting: Family Medicine

## 2019-12-23 NOTE — Telephone Encounter (Signed)
Caller name: Clair Gulling [facility] ) (PT) Call back number: [phone]   Clair Gulling wants to confirm patient was diagnosed with a left navicular fracture. Patient said she was giving a walking shoe but she is not wearing it. She is wearing regular tennis shoes. Clair Gulling wants to confirmed weight bearing status.   +1 edema  Patient claims she will start  wearing her compression socks once it gets cooler.  Patient continue to have loose stools.  Patient finished antibiotic yesterday.

## 2019-12-23 NOTE — Telephone Encounter (Signed)
Yes xray shows a likely navicular stress fracture. She can bear weight as tolerated til seen by ortho next week. She should avoid heavy exercise and should do upper body work with PT and mild lower body work

## 2019-12-23 NOTE — Telephone Encounter (Signed)
FYI. Please advise.

## 2019-12-24 ENCOUNTER — Other Ambulatory Visit (INDEPENDENT_AMBULATORY_CARE_PROVIDER_SITE_OTHER): Payer: PPO

## 2019-12-24 DIAGNOSIS — L0390 Cellulitis, unspecified: Secondary | ICD-10-CM

## 2019-12-24 LAB — CBC WITH DIFFERENTIAL/PLATELET
Basophils Absolute: 0 10*3/uL (ref 0.0–0.1)
Basophils Relative: 0.4 % (ref 0.0–3.0)
Eosinophils Absolute: 0.2 10*3/uL (ref 0.0–0.7)
Eosinophils Relative: 2.9 % (ref 0.0–5.0)
HCT: 33.9 % — ABNORMAL LOW (ref 36.0–46.0)
Hemoglobin: 11.3 g/dL — ABNORMAL LOW (ref 12.0–15.0)
Lymphocytes Relative: 37.3 % (ref 12.0–46.0)
Lymphs Abs: 2.5 10*3/uL (ref 0.7–4.0)
MCHC: 33.4 g/dL (ref 30.0–36.0)
MCV: 89.7 fl (ref 78.0–100.0)
Monocytes Absolute: 0.7 10*3/uL (ref 0.1–1.0)
Monocytes Relative: 9.8 % (ref 3.0–12.0)
Neutro Abs: 3.4 10*3/uL (ref 1.4–7.7)
Neutrophils Relative %: 49.6 % (ref 43.0–77.0)
Platelets: 379 10*3/uL (ref 150.0–400.0)
RBC: 3.78 Mil/uL — ABNORMAL LOW (ref 3.87–5.11)
RDW: 16.3 % — ABNORMAL HIGH (ref 11.5–15.5)
WBC: 6.8 10*3/uL (ref 4.0–10.5)

## 2019-12-24 LAB — SEDIMENTATION RATE: Sed Rate: 25 mm/hr (ref 0–30)

## 2019-12-24 NOTE — Telephone Encounter (Signed)
Alexis Olson notified of note.  He stated that he has been working on non weight bearing for lower extremities

## 2019-12-24 NOTE — Telephone Encounter (Signed)
Called Jim back after call placed while away from desk. Left vm message with instructions from PCP. And advised to keep ortho appointment for next week

## 2019-12-24 NOTE — Telephone Encounter (Signed)
Left message on PT voicemail for return called to office to f/u on information requested.

## 2019-12-25 NOTE — Telephone Encounter (Signed)
error 

## 2019-12-29 ENCOUNTER — Other Ambulatory Visit: Payer: Self-pay

## 2019-12-29 DIAGNOSIS — M170 Bilateral primary osteoarthritis of knee: Secondary | ICD-10-CM | POA: Diagnosis not present

## 2019-12-29 DIAGNOSIS — F419 Anxiety disorder, unspecified: Secondary | ICD-10-CM | POA: Diagnosis not present

## 2019-12-29 DIAGNOSIS — E538 Deficiency of other specified B group vitamins: Secondary | ICD-10-CM

## 2019-12-29 DIAGNOSIS — M151 Heberden's nodes (with arthropathy): Secondary | ICD-10-CM | POA: Diagnosis not present

## 2019-12-29 DIAGNOSIS — K219 Gastro-esophageal reflux disease without esophagitis: Secondary | ICD-10-CM | POA: Diagnosis not present

## 2019-12-29 DIAGNOSIS — M47812 Spondylosis without myelopathy or radiculopathy, cervical region: Secondary | ICD-10-CM | POA: Diagnosis not present

## 2019-12-29 DIAGNOSIS — M2578 Osteophyte, vertebrae: Secondary | ICD-10-CM | POA: Diagnosis not present

## 2019-12-29 DIAGNOSIS — M545 Low back pain, unspecified: Secondary | ICD-10-CM | POA: Diagnosis not present

## 2019-12-29 DIAGNOSIS — D649 Anemia, unspecified: Secondary | ICD-10-CM

## 2019-12-29 DIAGNOSIS — M19041 Primary osteoarthritis, right hand: Secondary | ICD-10-CM | POA: Diagnosis not present

## 2019-12-29 DIAGNOSIS — M19011 Primary osteoarthritis, right shoulder: Secondary | ICD-10-CM | POA: Diagnosis not present

## 2019-12-29 DIAGNOSIS — D619 Aplastic anemia, unspecified: Secondary | ICD-10-CM

## 2019-12-29 DIAGNOSIS — M810 Age-related osteoporosis without current pathological fracture: Secondary | ICD-10-CM | POA: Diagnosis not present

## 2019-12-29 DIAGNOSIS — M19031 Primary osteoarthritis, right wrist: Secondary | ICD-10-CM | POA: Diagnosis not present

## 2019-12-29 DIAGNOSIS — M4802 Spinal stenosis, cervical region: Secondary | ICD-10-CM | POA: Diagnosis not present

## 2019-12-29 DIAGNOSIS — M85872 Other specified disorders of bone density and structure, left ankle and foot: Secondary | ICD-10-CM | POA: Diagnosis not present

## 2019-12-29 DIAGNOSIS — I10 Essential (primary) hypertension: Secondary | ICD-10-CM | POA: Diagnosis not present

## 2019-12-29 DIAGNOSIS — M152 Bouchard's nodes (with arthropathy): Secondary | ICD-10-CM | POA: Diagnosis not present

## 2019-12-29 DIAGNOSIS — K5790 Diverticulosis of intestine, part unspecified, without perforation or abscess without bleeding: Secondary | ICD-10-CM | POA: Diagnosis not present

## 2019-12-29 DIAGNOSIS — D72829 Elevated white blood cell count, unspecified: Secondary | ICD-10-CM | POA: Diagnosis not present

## 2019-12-29 DIAGNOSIS — E876 Hypokalemia: Secondary | ICD-10-CM | POA: Diagnosis not present

## 2019-12-29 DIAGNOSIS — E785 Hyperlipidemia, unspecified: Secondary | ICD-10-CM | POA: Diagnosis not present

## 2019-12-29 DIAGNOSIS — D509 Iron deficiency anemia, unspecified: Secondary | ICD-10-CM | POA: Diagnosis not present

## 2019-12-29 DIAGNOSIS — M4312 Spondylolisthesis, cervical region: Secondary | ICD-10-CM | POA: Diagnosis not present

## 2019-12-29 DIAGNOSIS — I82402 Acute embolism and thrombosis of unspecified deep veins of left lower extremity: Secondary | ICD-10-CM | POA: Diagnosis not present

## 2019-12-29 DIAGNOSIS — I083 Combined rheumatic disorders of mitral, aortic and tricuspid valves: Secondary | ICD-10-CM | POA: Diagnosis not present

## 2019-12-29 NOTE — Progress Notes (Signed)
Patient has been notified of results and orders has been placed at [facility]; patient states will do IFOB and submit for testing.

## 2019-12-30 ENCOUNTER — Telehealth: Payer: Self-pay | Admitting: Family Medicine

## 2019-12-30 NOTE — Telephone Encounter (Signed)
Called pt informed office will contact tomorrow with response from provider. Last stool sample given in 11/2019

## 2019-12-30 NOTE — Telephone Encounter (Signed)
Patient is requesting a call back in reference to her stool sample kit she has from last year. Patient would like to know if ok to use ?

## 2019-12-31 NOTE — Telephone Encounter (Signed)
Patient stated that she has cards from a while ago to check for blood in stool.  Advised her to toss those.  We are doing different testing now and that Dr. Charlett Blake with her recent lab work wanted her to check for blood in her stools.

## 2019-12-31 NOTE — Addendum Note (Signed)
Addended by: Kem Boroughs D on: 12/31/2019 05:01 PM   Modules accepted: Orders

## 2020-01-05 MED FILL — CYANOCOBALAMIN 1,000 MCG/ML: 1000 | 84 days supply | Qty: 3 | Fill #1

## 2020-01-08 ENCOUNTER — Other Ambulatory Visit (INDEPENDENT_AMBULATORY_CARE_PROVIDER_SITE_OTHER): Payer: PPO

## 2020-01-08 DIAGNOSIS — D649 Anemia, unspecified: Secondary | ICD-10-CM | POA: Diagnosis not present

## 2020-01-08 DIAGNOSIS — S92255A Nondisplaced fracture of navicular [scaphoid] of left foot, initial encounter for closed fracture: Secondary | ICD-10-CM | POA: Diagnosis not present

## 2020-01-08 DIAGNOSIS — E538 Deficiency of other specified B group vitamins: Secondary | ICD-10-CM

## 2020-01-08 DIAGNOSIS — M79672 Pain in left foot: Secondary | ICD-10-CM | POA: Diagnosis not present

## 2020-01-08 LAB — CBC
HCT: 34 % — ABNORMAL LOW (ref 36.0–46.0)
Hemoglobin: 11.1 g/dL — ABNORMAL LOW (ref 12.0–15.0)
MCHC: 32.7 g/dL (ref 30.0–36.0)
MCV: 91.2 fl (ref 78.0–100.0)
Platelets: 293 10*3/uL (ref 150.0–400.0)
RBC: 3.73 Mil/uL — ABNORMAL LOW (ref 3.87–5.11)
RDW: 16.4 % — ABNORMAL HIGH (ref 11.5–15.5)
WBC: 7.8 10*3/uL (ref 4.0–10.5)

## 2020-01-09 ENCOUNTER — Other Ambulatory Visit: Payer: PPO

## 2020-01-09 LAB — RETICULOCYTES
ABS Retic: 63070 cells/uL (ref 20000–8000)
Retic Ct Pct: 1.7 %

## 2020-01-09 LAB — IRON,TIBC AND FERRITIN PANEL
%SAT: 20 % (calc) (ref 16–45)
Ferritin: 376 ng/mL — ABNORMAL HIGH (ref 16–288)
Iron: 50 ug/dL (ref 45–160)
TIBC: 251 mcg/dL (calc) (ref 250–450)

## 2020-01-09 MED FILL — ZOLPIDEM TARTRATE 5 MG TABS: 5 | 90 days supply | Qty: 90 | Fill #1

## 2020-01-10 ENCOUNTER — Other Ambulatory Visit: Payer: Self-pay | Admitting: Internal Medicine

## 2020-01-12 ENCOUNTER — Other Ambulatory Visit (HOSPITAL_COMMUNITY): Payer: Self-pay | Admitting: Allergy

## 2020-01-12 ENCOUNTER — Other Ambulatory Visit: Payer: Self-pay | Admitting: Internal Medicine

## 2020-01-12 MED FILL — MONTELUKAST SOD 10 MG TAB: 10 | 90 days supply | Qty: 90 | Fill #0

## 2020-01-12 MED FILL — DIPHENOXYLATE-ATROPINE 2.5-: 2.5-0.025 | 25 days supply | Qty: 150 | Fill #0

## 2020-01-12 NOTE — Telephone Encounter (Signed)
I know she has used this for many years but she has taken 90 in 30 days approximately if this prescription is up  Ideally she should come see me for refills but if we could at least get some information about her bowel habits at this time that would be helpful

## 2020-01-12 NOTE — Telephone Encounter (Signed)
I left her a message to call me back. 

## 2020-01-12 NOTE — Telephone Encounter (Signed)
Refill request for Lomotil came thru today. I spoke with Alexis Olson and she has not taken #90 in 30 days as we thought. She is going out of town and asked for the refill early. She has #20 in her bottle at home. She said she has been taking them more than usual because she was in the hospital recently with an infection and had more than usual diarrhea upon coming home. She is actually bringing stool to the lab today to have test run, ordered by her PCP. I will let Dr Carlean Purl know this. She understands that she needs to see [place] or her PCP regularly to get this refilled. She said the request was for #150 because that is the same price as #90. $45.00.

## 2020-01-12 NOTE — Telephone Encounter (Signed)
Pt is requesting refill on her lomotil

## 2020-01-12 NOTE — Telephone Encounter (Signed)
Please advise Sir, thank you. 

## 2020-01-13 ENCOUNTER — Other Ambulatory Visit: Payer: Self-pay | Admitting: Family Medicine

## 2020-01-13 ENCOUNTER — Other Ambulatory Visit (INDEPENDENT_AMBULATORY_CARE_PROVIDER_SITE_OTHER): Payer: PPO

## 2020-01-13 DIAGNOSIS — D638 Anemia in other chronic diseases classified elsewhere: Secondary | ICD-10-CM

## 2020-01-13 DIAGNOSIS — R195 Other fecal abnormalities: Secondary | ICD-10-CM

## 2020-01-13 DIAGNOSIS — D649 Anemia, unspecified: Secondary | ICD-10-CM | POA: Diagnosis not present

## 2020-01-13 LAB — FECAL OCCULT BLOOD, IMMUNOCHEMICAL: Fecal Occult Bld: POSITIVE — AB

## 2020-01-13 NOTE — Telephone Encounter (Signed)
I have already referred to GI because she was already with [facility] she will get an appt but with my referral it should happen a little faster. Dr Carlean Purl is with that group.

## 2020-01-13 NOTE — Telephone Encounter (Signed)
Refilled as approved. 

## 2020-01-13 NOTE — Telephone Encounter (Signed)
Received call from [facility]. Patient has Positive IFOB

## 2020-01-13 NOTE — Telephone Encounter (Signed)
For worsening anemia and positive iFOB we should refer her back to gastroenterology for evaluation. Ask and we can refer if she agrees. Also make sure she is taking Hemocyte F 1 tab daily and send #30 with 3 rf to pharmacy if she needs it. Repeat a CBC in 1-2 weeks to monitor and if she notes black sticky stool, blood in stool and increased stool frequency, going multiple times a day she has to go to ER for evaluation sooner than GI appt.

## 2020-01-13 NOTE — Telephone Encounter (Signed)
Okay refill x1 for 150

## 2020-01-13 NOTE — Telephone Encounter (Signed)
Called pt and spoke with about referral to GI . Pt stated she will call Dr Deniece Ree office with [facility] and will get an appointment to be seen. She wouldn't need a referral is already active in that office

## 2020-01-14 ENCOUNTER — Telehealth: Payer: Self-pay | Admitting: Family Medicine

## 2020-01-14 ENCOUNTER — Other Ambulatory Visit: Payer: Self-pay | Admitting: Family Medicine

## 2020-01-14 DIAGNOSIS — M79604 Pain in right leg: Secondary | ICD-10-CM

## 2020-01-14 DIAGNOSIS — M5489 Other dorsalgia: Secondary | ICD-10-CM

## 2020-01-14 NOTE — Progress Notes (Signed)
Pt is aware and will call office today for evaluation.

## 2020-01-14 NOTE — Telephone Encounter (Signed)
Outpatient referral placed

## 2020-01-14 NOTE — Telephone Encounter (Signed)
Caller name: Tora Duck) Call back number: [phone]  Patient was discharge early per patient request. Patient is going out of town. Patient states she would like outpatient PT.

## 2020-01-15 ENCOUNTER — Other Ambulatory Visit: Payer: Self-pay | Admitting: Family Medicine

## 2020-01-15 ENCOUNTER — Telehealth: Payer: Self-pay | Admitting: Family Medicine

## 2020-01-15 MED ORDER — CEFDINIR 300 MG PO CAPS: 300.0000 mg | ORAL_CAPSULE | Freq: Two times a day (BID) | ORAL | 0 refills | Status: AC

## 2020-01-15 NOTE — Telephone Encounter (Signed)
I have sent Cefdinir to [facility] please let patient know

## 2020-01-15 NOTE — Telephone Encounter (Signed)
Caller name: Brayleigh Call back number: [phone]  Patent states she has a UTI. She is out of town and would like prescription.  [facility]  [address], [place], [place] [address] [phone]

## 2020-01-16 NOTE — Telephone Encounter (Signed)
[facility]

## 2020-01-20 ENCOUNTER — Other Ambulatory Visit: Payer: Self-pay | Admitting: Family Medicine

## 2020-01-21 ENCOUNTER — Other Ambulatory Visit: Payer: Self-pay | Admitting: Family Medicine

## 2020-01-21 ENCOUNTER — Telehealth: Payer: Self-pay | Admitting: Family Medicine

## 2020-01-21 ENCOUNTER — Telehealth: Payer: Self-pay | Admitting: *Deleted

## 2020-01-21 MED ORDER — VALACYCLOVIR HCL 500 MG PO TABS
500.0000 mg | ORAL_TABLET | Freq: Every day | ORAL | 5 refills | Status: DC | PRN
Start: 2020-01-21 — End: 2020-01-21

## 2020-01-21 MED FILL — VALACYCLOVIR HCL 500 MG TAB: 500 | 30 days supply | Qty: 30 | Fill #0

## 2020-01-21 NOTE — Telephone Encounter (Signed)
I send it in it was supposed to be qd prn

## 2020-01-21 NOTE — Telephone Encounter (Signed)
[facility] sent over a request for clarification for valtrex.  Per insurance they need to know how many tablets patient can take daily.  The sig is: Take 1 tablet by mouth as needed

## 2020-01-21 NOTE — Telephone Encounter (Signed)
Caller name: Amparo Bristol) Call back number: [phone]  Needs to know if you received order fax on 12/22/19

## 2020-01-21 NOTE — Telephone Encounter (Signed)
Spoke with Alexis Olson will document and get all forms signed by provider and sent back on Friday 01/23/20

## 2020-01-25 ENCOUNTER — Ambulatory Visit (HOSPITAL_COMMUNITY)
Admission: EM | Admit: 2020-01-25 | Discharge: 2020-01-25 | Disposition: A | Discharge status: Home / Self Care | Payer: PPO | Attending: Family Medicine | Admitting: Family Medicine

## 2020-01-25 ENCOUNTER — Other Ambulatory Visit: Payer: Self-pay

## 2020-01-25 ENCOUNTER — Encounter (HOSPITAL_COMMUNITY): Payer: Self-pay | Admitting: Emergency Medicine

## 2020-01-25 DIAGNOSIS — L03116 Cellulitis of left lower limb: Secondary | ICD-10-CM | POA: Diagnosis not present

## 2020-01-25 LAB — CBC WITH DIFFERENTIAL/PLATELET
Abs Immature Granulocytes: 0.05 10*3/uL (ref 0.00–0.07)
Basophils Absolute: 0.1 10*3/uL (ref 0.0–0.1)
Basophils Relative: 1 %
Eosinophils Absolute: 0.2 10*3/uL (ref 0.0–0.5)
Eosinophils Relative: 2 %
HCT: 34.9 % — ABNORMAL LOW (ref 36.0–46.0)
Hemoglobin: 11.1 g/dL — ABNORMAL LOW (ref 12.0–15.0)
Immature Granulocytes: 1 %
Lymphocytes Relative: 24 %
Lymphs Abs: 2.6 10*3/uL (ref 0.7–4.0)
MCH: 30.2 pg (ref 26.0–34.0)
MCHC: 31.8 g/dL (ref 30.0–36.0)
MCV: 95.1 fL (ref 80.0–100.0)
Monocytes Absolute: 0.7 10*3/uL (ref 0.1–1.0)
Monocytes Relative: 6 %
Neutro Abs: 7.2 10*3/uL (ref 1.7–7.7)
Neutrophils Relative %: 66 %
Platelets: 353 10*3/uL (ref 150–400)
RBC: 3.67 MIL/uL — ABNORMAL LOW (ref 3.87–5.11)
RDW: 15.6 % — ABNORMAL HIGH (ref 11.5–15.5)
WBC: 10.7 10*3/uL — ABNORMAL HIGH (ref 4.0–10.5)
nRBC: 0 % (ref 0.0–0.2)

## 2020-01-25 LAB — C-REACTIVE PROTEIN: CRP: 0.7 mg/dL (ref <1.0)

## 2020-01-25 LAB — SEDIMENTATION RATE: Sed Rate: 45 mm/hr — ABNORMAL HIGH (ref 0–22)

## 2020-01-25 MED ORDER — CLINDAMYCIN HCL 300 MG PO CAPS: 300.0000 mg | ORAL_CAPSULE | Freq: Three times a day (TID) | ORAL | 0 refills | Status: DC

## 2020-01-25 NOTE — ED Triage Notes (Signed)
Patient has recurrent left foot pain.  Patient reports a history of septic joint issues.  Left foot is red

## 2020-01-25 NOTE — Discharge Instructions (Signed)
Please try the medicine  Please try elevation  Please follow up if your symptoms fail to improve.

## 2020-01-25 NOTE — ED Provider Notes (Signed)
[facility]    CSN: [id number] Arrival date & time: 01/25/20  1548      History   Chief Complaint Chief Complaint  Patient presents with  . Foot Pain    HPI Alexis Olson is a 86 y.o. female.   She is presenting with left foot and ankle pain.  The pain is been ongoing for a day.  She reports having a septic joint recently.  She reported being treated with clindamycin that resolved her symptoms.  Denies any injury or inciting event.  Symptoms are constant in nature.  HPI  Past Medical History:  Diagnosis Date  . ALLERGIC RHINITIS   . ANEMIA, CHRONIC    Malabsorption related to bypass hx, B12 and iron deficiency  . ANXIETY   . Arthritis   . ASTHMA   . Bariatric surgery status   . Bowel perforation ([place]) 2016  . C. difficile diarrhea   . Colon perforation ([place]) 08/2014   following polypetomy during colo, surgical repair  . COLONIC POLYPS, ADENOMATOUS, HX OF 2010  . DIVERTICULOSIS, COLON   . DVT (deep venous thrombosis) ([place]) 2018  . DVT (deep venous thrombosis) ([place])   . GERD (gastroesophageal reflux disease) 08/23/2015  . Graded compression stocking in place   . History of blood transfusion   . History of migraine   . History of rheumatic fever   . HSV (herpes simplex virus) infection 08/16/2015  . Hyperlipidemia   . Hypertension    Denies  . Hyponatremia 05/02/2016  . Incomplete left bundle branch block (LBBB) 2016   Noted on EKG  . INSOMNIA   . Lumbosacral disc disease    Chronic pain; History of osteomyelitis 2010 following ESI complication  . Obesity   . Osteomyelitis ([place])   . Pelvic fracture ([place])   . Pneumonia   . Recurrent UTI 08/23/2015  . URINARY URGENCY   . Ventral hernia without obstruction or gangrene 05/22/2018  . VITAMIN B12 DEFICIENCY     Patient Active Problem List   Diagnosis Date Noted  . Cellulitis 12/20/2019  . Navicular fracture of ankle 12/20/2019  . Neck pain 12/20/2019  . Diarrhea 12/01/2019  . Late effects of  accidental fall 11/13/2019  . DVT (deep venous thrombosis) ([place]) 11/13/2019  . Hypomagnesemia 11/13/2019  . Swelling of right hand 11/13/2019  . Acute urinary retention 11/13/2019  . S/P TKR (total knee replacement), right 08/26/2019  . Ventral hernia without obstruction or gangrene 05/22/2018  . Greenfield filter in place 03/08/2017  . Pain of right lower extremity 03/08/2017  . Preventative health care 02/13/2017  . Hyponatremia 05/02/2016  . GERD (gastroesophageal reflux disease) 08/23/2015  . HSV (herpes simplex virus) infection 08/16/2015  . Hyperlipidemia, mild 08/16/2015  . Osteoporosis   . Hypokalemia   . Chronic diarrhea   . Anemia of chronic disease   . OSTEOMYELITIS, VERTEBRA 05/05/2009  . [place] DISEASE, LUMBOSACRAL SPINE 04/22/2009  . Essential hypertension 03/30/2009  . Vitamin B 12 deficiency 07/09/2008  . DIVERTICULOSIS, COLON 07/09/2008  . COLONIC POLYPS, ADENOMATOUS, HX OF 07/09/2008  . Anxiety state 08/08/2007  . INSOMNIA 08/08/2007  . Bariatric surgery status 08/08/2007  . Midline back pain 10/30/2006  . ALLERGIC RHINITIS 09/18/2006    Past Surgical History:  Procedure Laterality Date  . [place]   for ruptured disc  . CHOLECYSTECTOMY    . COLONOSCOPY    . greenfield filter    . ileojejunal bypass  1976   for obesity  .  LAPAROTOMY N/A 09/18/2014   Procedure: EXPLORATORY LAPAROTOMY WITH REPAIR OF CECIAL PERFORATION;  Surgeon: Autumn Messing III, MD;  Location: [facility];  Service: General;  Laterality: N/A;  . TONSILLECTOMY    . TOTAL KNEE ARTHROPLASTY Right 08/26/2019   Procedure: RIGHT TOTAL KNEE ARTHROPLASTY, LEFT KNEE CORTISONE INJECTION;  Surgeon: Paralee Cancel, MD;  Location: [facility];  Service: Orthopedics;  Laterality: Right;  70 MINS  . UPPER GI ENDOSCOPY    . VASCULAR SURGERY Right    revascularization right lower extremity    OB History   No obstetric history on file.      Home Medications    Prior to Admission medications    Medication Sig Start Date End Date Taking? Authorizing Provider  acetaminophen (TYLENOL) 500 MG tablet Take 2 tablets (1,000 mg total) by mouth every 8 (eight) hours. 08/27/19   Maurice March, PA-C  albuterol (PROVENTIL HFA;VENTOLIN HFA) 108 (90 BASE) MCG/ACT inhaler Inhale 1-2 puffs into the lungs every 6 (six) hours as needed for wheezing or shortness of breath.    [provider]  ALPRAZolam Duanne Moron) 1 MG tablet Take 0.5-1 tablets (0.5-1 mg total) by mouth at bedtime as needed. 11/20/19   Mosie Lukes, MD  amitriptyline (ELAVIL) 100 MG tablet Take 1 tablet (100 mg total) by mouth daily. 09/17/19   Mosie Lukes, MD  apixaban (ELIQUIS) 5 MG TABS tablet Take 1 tablet (5 mg total) by mouth 2 (two) times daily. 12/18/19   Mosie Lukes, MD  azelastine (OPTIVAR) 0.05 % ophthalmic solution Place 1 drop into both eyes daily as needed (for dry eyes).     [provider]  benzonatate (TESSALON) 100 MG capsule Take 1 capsule (100 mg total) by mouth 3 (three) times daily as needed for cough. 04/01/18   Saguier, Percell Miller, PA-C  budesonide-formoterol (SYMBICORT) 160-4.5 MCG/ACT inhaler Inhale 2 puffs into the lungs See admin instructions. Inhale 2 puffs into the lungs one to two times a day Patient taking differently: Inhale 2 puffs into the lungs in the morning and at bedtime. Inhale 2 puffs into the lungs one to two times a day 03/04/18   Saguier, Percell Miller, PA-C  cefdinir (OMNICEF) 300 MG capsule Take 1 capsule (300 mg total) by mouth 2 (two) times daily for 10 days. 01/15/20 01/25/20  Mosie Lukes, MD  cetirizine (ZYRTEC) 10 MG tablet Take 10 mg by mouth daily.  02/05/14   [provider]  clindamycin (CLEOCIN) 300 MG capsule Take 1 capsule (300 mg total) by mouth 3 (three) times daily. 01/25/20   Rosemarie Ax, MD  cyanocobalamin (,VITAMIN B-12,) 1000 MCG/ML injection Inject 1cc into the muscle every month 10/08/19   Gatha Mayer, MD  dicyclomine (BENTYL) 10 MG capsule TAKE  1 CAPSULE BY MOUTH 3 TIMES DAILY AS NEEDED FOR DIARRHEA AND SYMPTOMS 12/08/19   Gatha Mayer, MD  diphenoxylate-atropine (LOMOTIL) 2.5-0.025 MG tablet TAKE 2 TABLETS BY MOUTH THREE TIMES DAILY AS NEEDED FOR DIARRHEA 01/12/20   Gatha Mayer, MD  docusate sodium (COLACE) 100 MG capsule Take 1 capsule (100 mg total) by mouth 2 (two) times daily. Patient taking differently: Take 100 mg by mouth daily as needed for mild constipation.  08/27/19   Griffith Citron R, PA-C  Fe Fum-FA-B Cmp-C-Zn-Mg-Mn-Cu (HEMOCYTE PLUS) 106-1 MG CAPS TAKE 1 CAPSULE BY MOUTH 2 TIMES DAILY. Patient taking differently: Take 1 capsule by mouth in the morning and at bedtime.  09/02/19   Gatha Mayer, MD  ferrous sulfate 325 (65 FE) MG tablet Take 1 tablet (325 mg total) by mouth 2 (two) times daily with a meal. 11/16/19   Allie Bossier, MD  fluticasone [facility]) 50 MCG/ACT nasal spray 2 sprays by Nasal route daily.      [provider]  gabapentin (NEURONTIN) 300 MG capsule TAKE 1 CAPSULE BY MOUTH AT BEDTIME Patient taking differently: Take 300 mg by mouth at bedtime.  08/04/19   Mosie Lukes, MD  hydrOXYzine (ATARAX) 25 MG tablet Take 25 mg by mouth 3 (three) times daily as needed for itching.     [provider]  levocetirizine (XYZAL) 5 MG tablet Take 1 tablet (5 mg total) by mouth every evening. 08/04/14   Rowe Clack, MD  lubiprostone (AMITIZA) 24 MCG capsule Take 24 mcg by mouth daily as needed for constipation.    [provider]  magnesium oxide (MAG-OX 400) 400 MG tablet Take 400 mg by mouth 2 (two) times daily.     [provider]  methocarbamol (ROBAXIN) 500 MG tablet Take 1 tablet (500 mg total) by mouth every 6 (six) hours as needed for muscle spasms. 08/27/19   Maurice March, PA-C  montelukast (SINGULAIR) 10 MG tablet Take 10 mg by mouth daily as needed (For allergy).  02/11/14   [provider]  omeprazole (PRILOSEC) 20 MG capsule TAKE 1 CAPSULE BY MOUTH  DAILY. Patient taking differently: Take 20 mg by mouth daily.  06/16/19   Gatha Mayer, MD  ondansetron (ZOFRAN-ODT) 4 MG disintegrating tablet Take 1 tablet (4 mg total) by mouth 3 (three) times daily as needed for nausea or vomiting. 08/27/19   Maurice March, PA-C  oxyCODONE (OXY IR/ROXICODONE) 5 MG immediate release tablet Take 1 tablet (5 mg total) by mouth every 6 (six) hours as needed for severe pain. 12/14/19   Wieters, Hallie C, PA-C  potassium chloride SA (KLOR-CON) 20 MEQ tablet TAKE 1 TABLET BY MOUTH ONCE DAILY Patient taking differently: Take 20 mEq by mouth daily.  09/12/19   Mosie Lukes, MD  Probiotic Product (PROBIOTIC DAILY PO) Take 1 capsule by mouth daily.    [provider]  tamsulosin (FLOMAX) 0.4 MG CAPS capsule Take 1 capsule (0.4 mg total) by mouth daily after supper. 11/16/19   Allie Bossier, MD  valACYclovir (VALTREX) 500 MG tablet Take 1 tablet (500 mg total) by mouth daily as needed. 01/21/20   Mosie Lukes, MD  Vitamin D, Ergocalciferol, (DRISDOL) 1.25 MG (50000 UNIT) CAPS capsule TAKE 1 CAPSULE (50,000 UNITS TOTAL) BY MOUTH EVERY 7 (SEVEN) DAYS. 09/25/19   Mosie Lukes, MD  zolpidem (AMBIEN) 5 MG tablet TAKE 1 TABLET BY MOUTH AT BEDTIME Patient taking differently: Take 5 mg by mouth at bedtime.  10/08/19   Mosie Lukes, MD    Family History Family History  Problem Relation Age of Onset  . Cervical cancer Mother   . Stroke Mother   . Liver disease Sister   . Kidney disease Sister   . Cirrhosis Sister   . Colon cancer Neg Hx   . Esophageal cancer Neg Hx   . Rectal cancer Neg Hx   . Stomach cancer Neg Hx     Social History Social History   Tobacco Use  . Smoking status: Never Smoker  . Smokeless tobacco: Never Used  Substance Use Topics  . Alcohol use: No    Alcohol/week: 0.0 standard drinks  . Drug use: No  Allergies   Ativan [lorazepam], Other, Azithromycin, Benadryl [diphenhydramine hcl], Ciprofloxacin hcl, Codeine  phosphate, Hydrocodone, Hydrocodone-homatropine, Levaquin [levofloxacin in d5w], Levofloxacin, Meperidine hcl, Molds & smuts, Sulfa antibiotics, Tape, Tramadol, and Ultram [tramadol hcl]   Review of Systems Review of Systems  See HPI  Physical Exam Triage Vital Signs ED Triage Vitals  Enc Vitals Group     BP 01/25/20 1633 125/81     Pulse Rate 01/25/20 1633 94     Resp 01/25/20 1633 20     Temp 01/25/20 1633 99 F (37.2 C)     Temp Source 01/25/20 1633 Oral     SpO2 01/25/20 1633 98 %     Weight --      Height --      Head Circumference --      Peak Flow --      Pain Score 01/25/20 1630 8     Pain Loc --      Pain Edu? --      Excl. in [place]? --    No data found.  Updated Vital Signs BP 125/81 (BP Location: Left Arm)   Pulse 94   Temp 99 F (37.2 C) (Oral)   Resp 20   SpO2 98%   Visual Acuity Right Eye Distance:   Left Eye Distance:   Bilateral Distance:    Right Eye Near:   Left Eye Near:    Bilateral Near:     Physical Exam Gen: NAD, alert, cooperative with exam, well-appearing ENT: normal lips, normal nasal mucosa,  Eye: normal EOM, normal conjunctiva and lids Skin: no rashes, no areas of induration  Neuro: normal tone, normal sensation to touch Psych:  normal insight, alert and oriented MSK:  Left ankle/foot:  Erythema over the medial aspect of the ankle and midfoot. Normal ankle range of motion. Tenderness to palpation in this area. No streaking. Neurovascular intact   UC Treatments / Results  Labs (all labs ordered are listed, but only abnormal results are displayed) Labs Reviewed  CULTURE, BLOOD (SINGLE)  CBC WITH DIFFERENTIAL/PLATELET  SEDIMENTATION RATE  C-REACTIVE PROTEIN    EKG   Radiology No results found.  Procedures Procedures (including critical care time)  Medications Ordered in UC Medications - No data to display  Initial Impression / Assessment and Plan / UC Course  I have reviewed the triage vital signs and the  nursing notes.  Pertinent labs & imaging results that were available during my care of the patient were reviewed by me and considered in my medical decision making (see chart for details).     Alexis Olson is a 86 year old female is presenting with left ankle and foot pain.  Concerning for cellulitis.  She reports a recent history of septic joint.  Will provide clindamycin as she reports having responsive to that treatment and not being allergic.  Will obtain CBC, sed rate, CRP and blood culture.  Advise close follow-up.    Final Clinical Impressions(s) / UC Diagnoses   Final diagnoses:  Cellulitis of left lower extremity     Discharge Instructions     Please try the medicine  Please try elevation  Please follow up if your symptoms fail to improve.     ED Prescriptions    Medication Sig Dispense Auth. Provider   clindamycin (CLEOCIN) 300 MG capsule Take 1 capsule (300 mg total) by mouth 3 (three) times daily. 15 capsule Rosemarie Ax, MD     PDMP not reviewed this encounter.   Rosemarie Ax,  MD 01/25/20 1724

## 2020-01-26 ENCOUNTER — Telehealth: Payer: Self-pay | Admitting: Family Medicine

## 2020-01-26 ENCOUNTER — Telehealth: Payer: Self-pay

## 2020-01-26 NOTE — Telephone Encounter (Signed)
Left VM for patient. If she calls back please have her speak with a nurse/CMA and inform that it looks like she has a mild infection occurring with the elevated white count and sedimentation rate.  She should continue the clindamycin and close follow-up with either me or Dr. Charlett Blake..   If any questions then please take the best time and phone number to call and I will try to call her back.   Rosemarie Ax, MD [facility] 01/26/2020, 8:18 AM

## 2020-01-26 NOTE — Telephone Encounter (Signed)
Put her on the schedule at 3:20 tomorrow. Last time she thought it was a septic joint it was a fracture so we need to see and evaluate this pain.

## 2020-01-26 NOTE — Telephone Encounter (Signed)
Nurse Assessment Nurse: Glean Salvo, RN, Magda Paganini Date/Time Eilene Ghazi Time): 01/25/2020 2:01:20 PM Confirm and document reason for call. If symptomatic, describe symptoms. ---Caller states he is having left foot pain she thinks is a septic joint. Hx of septic joint. Does the patient have any new or worsening symptoms? ---Yes Will a triage be completed? ---Yes Related visit to physician within the last 2 weeks? ---Yes Does the PT have any chronic conditions? (i.e. diabetes, asthma, this includes High risk factors for pregnancy, etc.) ---Yes List chronic conditions. ---anemia Is this a behavioral health or substance abuse call? ---No Guidelines Guideline Title Affirmed Question Affirmed Notes Nurse Date/Time Eilene Ghazi Time) Foot Pain [1] MODERATE pain (e.g., interferes with normal activities, limping) AND [2] present > 3 days Rebecca Eaton 01/25/2020 2:04:12 PM Disp. Time Eilene Ghazi Time) Disposition Final User 01/25/2020 1:46:29 PM Attempt made - message left Glean Salvo, RN, Magda Paganini 01/25/2020 2:06:53 PM SEE PCP WITHIN 3 DAYS Yes Glean Salvo, RN, Magda Paganini PLEASE NOTE: All timestamps contained within this report are represented as [place] Standard Time. CONFIDENTIALTY NOTICE: This fax transmission is intended only for the addressee. It contains information that is legally privileged, confidential or otherwise protected from use or disclosure. If you are not the intended recipient, you are strictly prohibited from reviewing, disclosing, copying using or disseminating any of this information or taking any action in reliance on or regarding this information. If you have received this fax in error, please notify us immediately by telephone so that we can arrange for its return to [place]. Phone: [phone], Toll-Free: [phone], Fax: [phone] Page: 2 of 2 Call Id: [id number] [place] Disagree/Comply Comply Caller Understands Yes PreDisposition Call Doctor Care Advice Given Per Guideline SEE PCP WITHIN 3  DAYS: * You need to be seen within 2 or 3 days. * PCP VISIT: Call your doctor (or NP/PA) during regular office hours and make an appointment. A clinic or urgent care center are good places to go for care if your doctor's office is closed or you can't get an appointment. NOTE: If office will be open tomorrow, tell caller to call then, not in 3 days. CALL BACK IF: * Fever occurs * You become worse CARE ADVICE given per Foot Pain (Adult) guideline. Comments User: Beulah Gandy, RN Date/Time Eilene Ghazi Time): 01/25/2020 2:04:57 PM pain 8/10 Referrals REFERRED TO PCP OFFICE

## 2020-01-27 ENCOUNTER — Other Ambulatory Visit: Payer: Self-pay

## 2020-01-27 ENCOUNTER — Other Ambulatory Visit: Payer: Self-pay | Admitting: Medical

## 2020-01-27 ENCOUNTER — Ambulatory Visit (INDEPENDENT_AMBULATORY_CARE_PROVIDER_SITE_OTHER): Payer: PPO | Admitting: Medical

## 2020-01-27 VITALS — BP 119/58 | HR 102 | Temp 98.7°F | Resp 18 | Ht 65.0 in | Wt 172.6 lb

## 2020-01-27 DIAGNOSIS — D72829 Elevated white blood cell count, unspecified: Secondary | ICD-10-CM

## 2020-01-27 DIAGNOSIS — R700 Elevated erythrocyte sedimentation rate: Secondary | ICD-10-CM | POA: Diagnosis not present

## 2020-01-27 DIAGNOSIS — M542 Cervicalgia: Secondary | ICD-10-CM | POA: Diagnosis not present

## 2020-01-27 DIAGNOSIS — M79672 Pain in left foot: Secondary | ICD-10-CM

## 2020-01-27 DIAGNOSIS — L03116 Cellulitis of left lower limb: Secondary | ICD-10-CM | POA: Diagnosis not present

## 2020-01-27 MED ORDER — CLINDAMYCIN HCL 300 MG PO CAPS: 300.0000 mg | ORAL_CAPSULE | Freq: Three times a day (TID) | ORAL | 0 refills | Status: DC

## 2020-01-27 NOTE — Telephone Encounter (Signed)
Will be seen today by Percell Miller at 320p

## 2020-01-27 NOTE — Progress Notes (Signed)
Subjective:    Patient ID: Alexis Olson, female    DOB: Apr 28, 1933, 86 y.o.   MRN: [id number]  HPI  Pt in with left foot are pain. She was evaluated for recent cellulitis. But month ago had sepsis in August per pt.   HPI from the ED    HPI [name] is a 86 y.o. female.  She is presenting with left foot and ankle pain.  The pain is been ongoing for a day.  She reports having a septic joint recently.  She reported being treated with clindamycin that resolved her symptoms.  Denies any injury or inciting event.  Symptoms are constant in nature.   A/P from the ED below. Ms. Alexis Olson is a 86 year old female is presenting with left ankle and foot pain.  Concerning for cellulitis.  She reports a recent history of septic joint.  Will provide clindamycin as she reports having responsive to that treatment and not being allergic.  Will obtain CBC, sed rate, CRP and blood culture.  Advise close follow-up.     Pt blood culture is negative.  Sed rate elevated. crp not elevated.  Wbc was mild elevated at 10.7   Pt has some recent neck pain. Pt has oxcycodone for pain. She takes at most once a day.  Sept xray below. IMPRESSION: No acute fracture or dislocation identified. Degenerative joint changes of cervical spine.     Review of Systems  Constitutional: Negative for chills, fatigue and fever.  Respiratory: Negative for cough, chest tightness, shortness of breath and wheezing.   Cardiovascular: Negative for chest pain and palpitations.  Gastrointestinal: Negative for abdominal pain.  Skin: Negative for rash.  Neurological: Negative for dizziness and facial asymmetry.  Hematological: Negative for adenopathy. Does not bruise/bleed easily.  Psychiatric/Behavioral: Negative for behavioral problems and confusion.    Past Medical History:  Diagnosis Date  . ALLERGIC RHINITIS   . ANEMIA, CHRONIC    Malabsorption related to bypass hx, B12 and iron deficiency  . ANXIETY   .  Arthritis   . ASTHMA   . Bariatric surgery status   . Bowel perforation ([place]) 2016  . C. difficile diarrhea   . Colon perforation ([place]) 08/2014   following polypetomy during colo, surgical repair  . COLONIC POLYPS, ADENOMATOUS, HX OF 2010  . DIVERTICULOSIS, COLON   . DVT (deep venous thrombosis) ([place]) 2018  . DVT (deep venous thrombosis) ([place])   . GERD (gastroesophageal reflux disease) 08/23/2015  . Graded compression stocking in place   . History of blood transfusion   . History of migraine   . History of rheumatic fever   . HSV (herpes simplex virus) infection 08/16/2015  . Hyperlipidemia   . Hypertension    Denies  . Hyponatremia 05/02/2016  . Incomplete left bundle branch block (LBBB) 2016   Noted on EKG  . INSOMNIA   . Lumbosacral disc disease    Chronic pain; History of osteomyelitis 2010 following ESI complication  . Obesity   . Osteomyelitis ([place])   . Pelvic fracture ([place])   . Pneumonia   . Recurrent UTI 08/23/2015  . URINARY URGENCY   . Ventral hernia without obstruction or gangrene 05/22/2018  . VITAMIN B12 DEFICIENCY      Social History   Socioeconomic History  . Marital status: Married    Spouse name: Not on file  . Number of children: 3  . Years of education: Not on file  . Highest education level: Not on file  Occupational  History  . Occupation: retired Programmer, multimedia: RETIRED  Tobacco Use  . Smoking status: Never Smoker  . Smokeless tobacco: Never Used  Substance and Sexual Activity  . Alcohol use: No    Alcohol/week: 0.0 standard drinks  . Drug use: No  . Sexual activity: Never    Partners: Male  Other Topics Concern  . Not on file  Social History Narrative   Lives with spouse, Indep ADLs   Supportive family nearby   No dietary restrictions   Social Determinants of Health   Financial Resource Strain:   . Difficulty of Paying Living Expenses: Not on file  Food Insecurity:   . Worried About Charity fundraiser in the Last Year: Not on file    . Ran Out of Food in the Last Year: Not on file  Transportation Needs:   . Lack of Transportation (Medical): Not on file  . Lack of Transportation (Non-Medical): Not on file  Physical Activity:   . Days of Exercise per Week: Not on file  . Minutes of Exercise per Session: Not on file  Stress:   . Feeling of Stress : Not on file  Social Connections:   . Frequency of Communication with Friends and Family: Not on file  . Frequency of Social Gatherings with Friends and Family: Not on file  . Attends Religious Services: Not on file  . Active Member of Clubs or Organizations: Not on file  . Attends Archivist Meetings: Not on file  . Marital Status: Not on file  Intimate Partner Violence:   . Fear of Current or Ex-Partner: Not on file  . Emotionally Abused: Not on file  . Physically Abused: Not on file  . Sexually Abused: Not on file    Past Surgical History:  Procedure Laterality Date  . [place]   for ruptured disc  . CHOLECYSTECTOMY    . COLONOSCOPY    . greenfield filter    . ileojejunal bypass  1976   for obesity  . LAPAROTOMY N/A 09/18/2014   Procedure: EXPLORATORY LAPAROTOMY WITH REPAIR OF CECIAL PERFORATION;  Surgeon: Autumn Messing III, MD;  Location: [facility];  Service: General;  Laterality: N/A;  . TONSILLECTOMY    . TOTAL KNEE ARTHROPLASTY Right 08/26/2019   Procedure: RIGHT TOTAL KNEE ARTHROPLASTY, LEFT KNEE CORTISONE INJECTION;  Surgeon: Paralee Cancel, MD;  Location: [facility];  Service: Orthopedics;  Laterality: Right;  70 MINS  . UPPER GI ENDOSCOPY    . VASCULAR SURGERY Right    revascularization right lower extremity    Family History  Problem Relation Age of Onset  . Cervical cancer Mother   . Stroke Mother   . Liver disease Sister   . Kidney disease Sister   . Cirrhosis Sister   . Colon cancer Neg Hx   . Esophageal cancer Neg Hx   . Rectal cancer Neg Hx   . Stomach cancer Neg Hx     Allergies  Allergen Reactions  . Ativan [Lorazepam]  Anaphylaxis and Other (See Comments)    Deathly allergic; "resp rate dropped dropped to 6"  . Other Anaphylaxis    Red peppers--choking, also  . Azithromycin Diarrhea  . Benadryl [Diphenhydramine Hcl] Other (See Comments)    Paradoxical response  . Ciprofloxacin Hcl Other (See Comments)    Causes yeast infection and refuses to take  . Codeine Phosphate Nausea Only    Can tolerate in limited amounts  . Hydrocodone Itching and Nausea  And Vomiting  . Hydrocodone-Homatropine Itching and Nausea And Vomiting  . Levaquin [Levofloxacin In D5w] Other (See Comments)    Muscle soreness  . Levofloxacin   . Meperidine Hcl Other (See Comments)    Reaction ??  . Molds & Smuts Other (See Comments)    unsure  . Sulfa Antibiotics Other (See Comments)    Joint pain  . Tape Other (See Comments)    No Coban wrap (per the patient)  . Tramadol   . Ultram [Tramadol Hcl] Other (See Comments)    "Can't move joints"    Current Outpatient Medications on File Prior to Visit  Medication Sig Dispense Refill  . acetaminophen (TYLENOL) 500 MG tablet Take 2 tablets (1,000 mg total) by mouth every 8 (eight) hours. 30 tablet 0  . albuterol (PROVENTIL HFA;VENTOLIN HFA) 108 (90 BASE) MCG/ACT inhaler Inhale 1-2 puffs into the lungs every 6 (six) hours as needed for wheezing or shortness of breath.    . ALPRAZolam (XANAX) 1 MG tablet Take 0.5-1 tablets (0.5-1 mg total) by mouth at bedtime as needed. 90 tablet 1  . amitriptyline (ELAVIL) 100 MG tablet Take 1 tablet (100 mg total) by mouth daily. 90 tablet 1  . apixaban (ELIQUIS) 5 MG TABS tablet Take 1 tablet (5 mg total) by mouth 2 (two) times daily. 60 tablet 3  . azelastine (OPTIVAR) 0.05 % ophthalmic solution Place 1 drop into both eyes daily as needed (for dry eyes).     . cetirizine (ZYRTEC) 10 MG tablet Take 10 mg by mouth daily.     . clindamycin (CLEOCIN) 300 MG capsule Take 1 capsule (300 mg total) by mouth 3 (three) times daily. 15 capsule 0  .  cyanocobalamin (,VITAMIN B-12,) 1000 MCG/ML injection Inject 1cc into the muscle every month 10 mL 1  . dicyclomine (BENTYL) 10 MG capsule TAKE 1 CAPSULE BY MOUTH 3 TIMES DAILY AS NEEDED FOR DIARRHEA AND SYMPTOMS 270 capsule 3  . diphenoxylate-atropine (LOMOTIL) 2.5-0.025 MG tablet TAKE 2 TABLETS BY MOUTH THREE TIMES DAILY AS NEEDED FOR DIARRHEA 150 tablet 0  . docusate sodium (COLACE) 100 MG capsule Take 1 capsule (100 mg total) by mouth 2 (two) times daily. (Patient taking differently: Take 100 mg by mouth daily as needed for mild constipation. ) 28 capsule 0  . Fe Fum-FA-B Cmp-C-Zn-Mg-Mn-Cu (HEMOCYTE PLUS) 106-1 MG CAPS TAKE 1 CAPSULE BY MOUTH 2 TIMES DAILY. (Patient taking differently: Take 1 capsule by mouth in the morning and at bedtime. ) 60 capsule 1  . ferrous sulfate 325 (65 FE) MG tablet Take 1 tablet (325 mg total) by mouth 2 (two) times daily with a meal. 60 tablet 0  . fluticasone (FLONASE) 50 MCG/ACT nasal spray 2 sprays by Nasal route daily.      Marland Kitchen gabapentin (NEURONTIN) 300 MG capsule TAKE 1 CAPSULE BY MOUTH AT BEDTIME (Patient taking differently: Take 300 mg by mouth at bedtime. ) 90 capsule 1  . hydrOXYzine (ATARAX) 25 MG tablet Take 25 mg by mouth 3 (three) times daily as needed for itching.     . levocetirizine (XYZAL) 5 MG tablet Take 1 tablet (5 mg total) by mouth every evening. 90 tablet 3  . lubiprostone (AMITIZA) 24 MCG capsule Take 24 mcg by mouth daily as needed for constipation.    . magnesium oxide (MAG-OX 400) 400 MG tablet Take 400 mg by mouth 2 (two) times daily.     . methocarbamol (ROBAXIN) 500 MG tablet Take 1 tablet (500 mg total) by  mouth every 6 (six) hours as needed for muscle spasms. 40 tablet 0  . montelukast (SINGULAIR) 10 MG tablet Take 10 mg by mouth daily as needed (For allergy).     Marland Kitchen omeprazole (PRILOSEC) 20 MG capsule TAKE 1 CAPSULE BY MOUTH DAILY. (Patient taking differently: Take 20 mg by mouth daily. ) 90 capsule 3  . ondansetron (ZOFRAN-ODT) 4 MG  disintegrating tablet Take 1 tablet (4 mg total) by mouth 3 (three) times daily as needed for nausea or vomiting. 20 tablet 0  . oxyCODONE (OXY IR/ROXICODONE) 5 MG immediate release tablet Take 1 tablet (5 mg total) by mouth every 6 (six) hours as needed for severe pain. 10 tablet 0  . potassium chloride SA (KLOR-CON) 20 MEQ tablet TAKE 1 TABLET BY MOUTH ONCE DAILY (Patient taking differently: Take 20 mEq by mouth daily. ) 90 tablet 1  . Probiotic Product (PROBIOTIC DAILY PO) Take 1 capsule by mouth daily.    . tamsulosin (FLOMAX) 0.4 MG CAPS capsule Take 1 capsule (0.4 mg total) by mouth daily after supper. 30 capsule 0  . valACYclovir (VALTREX) 500 MG tablet Take 1 tablet (500 mg total) by mouth daily as needed. 30 tablet 5  . Vitamin D, Ergocalciferol, (DRISDOL) 1.25 MG (50000 UNIT) CAPS capsule TAKE 1 CAPSULE (50,000 UNITS TOTAL) BY MOUTH EVERY 7 (SEVEN) DAYS. 12 capsule 4  . zolpidem (AMBIEN) 5 MG tablet TAKE 1 TABLET BY MOUTH AT BEDTIME (Patient taking differently: Take 5 mg by mouth at bedtime. ) 90 tablet 1  . benzonatate (TESSALON) 100 MG capsule Take 1 capsule (100 mg total) by mouth 3 (three) times daily as needed for cough. (Patient not taking: Reported on 01/27/2020) 30 capsule 0  . budesonide-formoterol (SYMBICORT) 160-4.5 MCG/ACT inhaler Inhale 2 puffs into the lungs See admin instructions. Inhale 2 puffs into the lungs one to two times a day (Patient not taking: Reported on 01/27/2020) 1 Inhaler 2   No current facility-administered medications on file prior to visit.    BP (!) 119/58   Pulse (!) 102   Temp 98.7 F (37.1 C) (Oral)   Resp 18   Ht 5\' 5"  (1.651 m)   Wt 172 lb 9.6 oz (78.3 kg)   SpO2 100%   BMI 28.72 kg/m       Objective:   Physical Exam  General- No acute distress. Pleasant patient. Neck- Full range of motion, no jvd Lungs- Clear, even and unlabored. Heart- regular rate and rhythm. Neurologic- CNII- XII grossly intact.  Left lower ext- calf not swollen.  Neg homan sign. Left ankle and left foot mild swelling. Faint warmth and mild tender medial ankle and proximal foot.      Assessment & Plan:  [phone] For left foot and ankle pain from cellutitis. Continue clindamycin. Based on slight persistent wbc elevation on lab review take additional 5 days clindamycin. Make sure you use probiotics.  Cbc, sed rate and uric acid labs.  Follow up this coming Friday or Monday. Be seen sooner here, UC or ED if swelling, redness or fever.  Time spent with patient today was 30  minutes which consisted of chart review(ED evaluation), discussing diagnosis, work up, treatment and documentation.

## 2020-01-28 LAB — CBC WITH DIFFERENTIAL/PLATELET
Absolute Monocytes: 889 cells/uL (ref 200–950)
Basophils Absolute: 35 cells/uL (ref 0–200)
Basophils Relative: 0.3 %
Eosinophils Absolute: 222 cells/uL (ref 15–500)
Eosinophils Relative: 1.9 %
HCT: 34.2 % — ABNORMAL LOW (ref 35.0–45.0)
Hemoglobin: 10.9 g/dL — ABNORMAL LOW (ref 11.7–15.5)
Lymphs Abs: 2796 cells/uL (ref 850–3900)
MCH: 29.7 pg (ref 27.0–33.0)
MCHC: 31.9 g/dL — ABNORMAL LOW (ref 32.0–36.0)
MCV: 93.2 fL (ref 80.0–100.0)
MPV: 9.6 fL (ref 7.5–12.5)
Monocytes Relative: 7.6 %
Neutro Abs: 7757 cells/uL (ref 1500–7800)
Neutrophils Relative %: 66.3 %
Platelets: 300 10*3/uL (ref 140–400)
RBC: 3.67 10*6/uL — ABNORMAL LOW (ref 3.80–5.10)
RDW: 13.8 % (ref 11.0–15.0)
Total Lymphocyte: 23.9 %
WBC: 11.7 10*3/uL — ABNORMAL HIGH (ref 3.8–10.8)

## 2020-01-28 LAB — URIC ACID: Uric Acid, Serum: 4.1 mg/dL (ref 2.5–7.0)

## 2020-01-28 LAB — SEDIMENTATION RATE: Sed Rate: 46 mm/h — ABNORMAL HIGH (ref 0–30)

## 2020-01-28 NOTE — Patient Instructions (Addendum)
For left foot and ankle pain from cellutitis. Continue clindamycin. Based on slight persistent wbc elevation on lab review take additional 5 days clindamycin. Make sure you use probiotics.  Cbc, sed rate and uric acid labs.  Follow up this coming Friday or Monday. Be seen sooner here, UC or ED if swelling, redness or fever.

## 2020-01-30 ENCOUNTER — Other Ambulatory Visit: Payer: Self-pay | Admitting: Internal Medicine

## 2020-01-30 LAB — CULTURE, BLOOD (SINGLE)
Culture: NO GROWTH
Special Requests: ADEQUATE

## 2020-01-30 MED FILL — HEMOCYTE PLUS CAPSULE: 106-1 | 30 days supply | Qty: 60 | Fill #0

## 2020-01-30 MED FILL — CLINDAMYCIN HCL 300 MG CAP: 300 | 5 days supply | Qty: 15 | Fill #0

## 2020-01-30 MED FILL — LEVOCETIRIZINE 5 MG TABLET: 5 | 90 days supply | Qty: 90 | Fill #0

## 2020-02-10 ENCOUNTER — Encounter: Payer: Self-pay | Admitting: Family Medicine

## 2020-02-10 ENCOUNTER — Other Ambulatory Visit: Payer: Self-pay

## 2020-02-10 ENCOUNTER — Ambulatory Visit (INDEPENDENT_AMBULATORY_CARE_PROVIDER_SITE_OTHER): Payer: PPO | Admitting: Family Medicine

## 2020-02-10 VITALS — BP 104/66 | HR 60 | Resp 16 | Ht 65.0 in | Wt 171.6 lb

## 2020-02-10 DIAGNOSIS — M79672 Pain in left foot: Secondary | ICD-10-CM

## 2020-02-10 DIAGNOSIS — E785 Hyperlipidemia, unspecified: Secondary | ICD-10-CM

## 2020-02-10 DIAGNOSIS — M109 Gout, unspecified: Secondary | ICD-10-CM

## 2020-02-10 DIAGNOSIS — R197 Diarrhea, unspecified: Secondary | ICD-10-CM | POA: Diagnosis not present

## 2020-02-10 DIAGNOSIS — M810 Age-related osteoporosis without current pathological fracture: Secondary | ICD-10-CM

## 2020-02-10 DIAGNOSIS — I10 Essential (primary) hypertension: Secondary | ICD-10-CM | POA: Diagnosis not present

## 2020-02-10 DIAGNOSIS — K529 Noninfective gastroenteritis and colitis, unspecified: Secondary | ICD-10-CM

## 2020-02-10 DIAGNOSIS — R700 Elevated erythrocyte sedimentation rate: Secondary | ICD-10-CM

## 2020-02-10 DIAGNOSIS — E538 Deficiency of other specified B group vitamins: Secondary | ICD-10-CM

## 2020-02-10 DIAGNOSIS — E871 Hypo-osmolality and hyponatremia: Secondary | ICD-10-CM | POA: Diagnosis not present

## 2020-02-10 DIAGNOSIS — I82402 Acute embolism and thrombosis of unspecified deep veins of left lower extremity: Secondary | ICD-10-CM

## 2020-02-10 DIAGNOSIS — Z0000 Encounter for general adult medical examination without abnormal findings: Secondary | ICD-10-CM

## 2020-02-10 DIAGNOSIS — R739 Hyperglycemia, unspecified: Secondary | ICD-10-CM | POA: Diagnosis not present

## 2020-02-10 NOTE — Patient Instructions (Signed)
Vitamin B12 sublingual tabs, 1000 mcg under tongue daily Preventive Care 65 Years and Older, Female Preventive care refers to lifestyle choices and visits with your health care provider that can promote health and wellness. This includes:  A yearly physical exam. This is also called an annual well check.  Regular dental and eye exams.  Immunizations.  Screening for certain conditions.  Healthy lifestyle choices, such as diet and exercise. What can I expect for my preventive care visit? Physical exam Your health care provider will check:  Height and weight. These may be used to calculate body mass index (BMI), which is a measurement that tells if you are at a healthy weight.  Heart rate and blood pressure.  Your skin for abnormal spots. Counseling Your health care provider may ask you questions about:  Alcohol, tobacco, and drug use.  Emotional well-being.  Home and relationship well-being.  Sexual activity.  Eating habits.  History of falls.  Memory and ability to understand (cognition).  Work and work Statistician.  Pregnancy and menstrual history. What immunizations do I need?  Influenza (flu) vaccine  This is recommended every year. Tetanus, diphtheria, and pertussis (Tdap) vaccine  You may need a Td booster every 10 years. Varicella (chickenpox) vaccine  You may need this vaccine if you have not already been vaccinated. Zoster (shingles) vaccine  You may need this after age 43. Pneumococcal conjugate (PCV13) vaccine  One dose is recommended after age 67. Pneumococcal polysaccharide (PPSV23) vaccine  One dose is recommended after age 76. Measles, mumps, and rubella (MMR) vaccine  You may need at least one dose of MMR if you were born in 1957 or later. You may also need a second dose. Meningococcal conjugate (MenACWY) vaccine  You may need this if you have certain conditions. Hepatitis A vaccine  You may need this if you have certain conditions  or if you travel or work in places where you may be exposed to hepatitis A. Hepatitis B vaccine  You may need this if you have certain conditions or if you travel or work in places where you may be exposed to hepatitis B. Haemophilus influenzae type b (Hib) vaccine  You may need this if you have certain conditions. You may receive vaccines as individual doses or as more than one vaccine together in one shot (combination vaccines). Talk with your health care provider about the risks and benefits of combination vaccines. What tests do I need? Blood tests  Lipid and cholesterol levels. These may be checked every 5 years, or more frequently depending on your overall health.  Hepatitis C test.  Hepatitis B test. Screening  Lung cancer screening. You may have this screening every year starting at age 51 if you have a 30-pack-year history of smoking and currently smoke or have quit within the past 15 years.  Colorectal cancer screening. All adults should have this screening starting at age 20 and continuing until age 80. Your health care provider may recommend screening at age 75 if you are at increased risk. You will have tests every 1-10 years, depending on your results and the type of screening test.  Diabetes screening. This is done by checking your blood sugar (glucose) after you have not eaten for a while (fasting). You may have this done every 1-3 years.  Mammogram. This may be done every 1-2 years. Talk with your health care provider about how often you should have regular mammograms.  BRCA-related cancer screening. This may be done if you have a  family history of breast, ovarian, tubal, or peritoneal cancers. Other tests  Sexually transmitted disease (STD) testing.  Bone density scan. This is done to screen for osteoporosis. You may have this done starting at age 31. Follow these instructions at home: Eating and drinking  Eat a diet that includes fresh fruits and vegetables,  whole grains, lean protein, and low-fat dairy products. Limit your intake of foods with high amounts of sugar, saturated fats, and salt.  Take vitamin and mineral supplements as recommended by your health care provider.  Do not drink alcohol if your health care provider tells you not to drink.  If you drink alcohol: ? Limit how much you have to 0-1 drink a day. ? Be aware of how much alcohol is in your drink. In the [place] one drink equals one 12 oz bottle of beer (355 mL), one 5 oz glass of wine (148 mL), or one 1 oz glass of hard liquor (44 mL). Lifestyle  Take daily care of your teeth and gums.  Stay active. Exercise for at least 30 minutes on 5 or more days each week.  Do not use any products that contain nicotine or tobacco, such as cigarettes, e-cigarettes, and chewing tobacco. If you need help quitting, ask your health care provider.  If you are sexually active, practice safe sex. Use a condom or other form of protection in order to prevent STIs (sexually transmitted infections).  Talk with your health care provider about taking a low-dose aspirin or statin. What's next?  Go to your health care provider once a year for a well check visit.  Ask your health care provider how often you should have your eyes and teeth checked.  Stay up to date on all vaccines. This information is not intended to replace advice given to you by your health care provider. Make sure you discuss any questions you have with your health care provider. Document Revised: 03/07/2018 Document Reviewed: 03/07/2018 Elsevier Patient Education  2020 [employer].

## 2020-02-11 DIAGNOSIS — M79672 Pain in left foot: Secondary | ICD-10-CM | POA: Insufficient documentation

## 2020-02-11 DIAGNOSIS — M109 Gout, unspecified: Secondary | ICD-10-CM | POA: Insufficient documentation

## 2020-02-11 DIAGNOSIS — R739 Hyperglycemia, unspecified: Secondary | ICD-10-CM | POA: Insufficient documentation

## 2020-02-11 LAB — COMPREHENSIVE METABOLIC PANEL
ALT: 7 U/L (ref 0–35)
AST: 12 U/L (ref 0–37)
Albumin: 3.8 g/dL (ref 3.5–5.2)
Alkaline Phosphatase: 44 U/L (ref 39–117)
BUN: 16 mg/dL (ref 6–23)
CO2: 29 mEq/L (ref 19–32)
Calcium: 8.9 mg/dL (ref 8.4–10.5)
Chloride: 101 mEq/L (ref 96–112)
Creatinine, Ser: 0.5 mg/dL (ref 0.40–1.20)
GFR: 85.91 mL/min (ref >60.00)
Glucose, Bld: 82 mg/dL (ref 70–99)
Potassium: 4.1 mEq/L (ref 3.5–5.1)
Sodium: 136 mEq/L (ref 135–145)
Total Bilirubin: 0.3 mg/dL (ref 0.2–1.2)
Total Protein: 6.3 g/dL (ref 6.0–8.3)

## 2020-02-11 LAB — LIPID PANEL
Cholesterol: 126 mg/dL (ref 0–200)
HDL: 45.7 mg/dL (ref >39.00)
LDL Cholesterol: 54 mg/dL (ref 0–99)
NonHDL: 80.1
Total CHOL/HDL Ratio: 3
Triglycerides: 131 mg/dL (ref 0.0–149.0)
VLDL: 26.2 mg/dL (ref 0.0–40.0)

## 2020-02-11 LAB — CBC WITH DIFFERENTIAL/PLATELET
Basophils Absolute: 0.1 10*3/uL (ref 0.0–0.1)
Basophils Relative: 0.8 % (ref 0.0–3.0)
Eosinophils Absolute: 0.2 10*3/uL (ref 0.0–0.7)
Eosinophils Relative: 2.5 % (ref 0.0–5.0)
HCT: 32.7 % — ABNORMAL LOW (ref 36.0–46.0)
Hemoglobin: 10.8 g/dL — ABNORMAL LOW (ref 12.0–15.0)
Lymphocytes Relative: 27.1 % (ref 12.0–46.0)
Lymphs Abs: 1.9 10*3/uL (ref 0.7–4.0)
MCHC: 33.2 g/dL (ref 30.0–36.0)
MCV: 92.2 fl (ref 78.0–100.0)
Monocytes Absolute: 0.6 10*3/uL (ref 0.1–1.0)
Monocytes Relative: 9.2 % (ref 3.0–12.0)
Neutro Abs: 4.2 10*3/uL (ref 1.4–7.7)
Neutrophils Relative %: 60.4 % (ref 43.0–77.0)
Platelets: 294 10*3/uL (ref 150.0–400.0)
RBC: 3.55 Mil/uL — ABNORMAL LOW (ref 3.87–5.11)
RDW: 15.6 % — ABNORMAL HIGH (ref 11.5–15.5)
WBC: 7 10*3/uL (ref 4.0–10.5)

## 2020-02-11 LAB — HEMOGLOBIN A1C: Hgb A1c MFr Bld: 5 % (ref 4.6–6.5)

## 2020-02-11 LAB — TSH: TSH: 1.05 u[IU]/mL (ref 0.35–4.50)

## 2020-02-11 LAB — VITAMIN B12: Vitamin B-12: 214 pg/mL (ref 211–911)

## 2020-02-11 LAB — MAGNESIUM: Magnesium: 1.9 mg/dL (ref 1.5–2.5)

## 2020-02-11 LAB — URIC ACID: Uric Acid, Serum: 3.7 mg/dL (ref 2.4–7.0)

## 2020-02-11 LAB — SEDIMENTATION RATE: Sed Rate: 26 mm/hr (ref 0–30)

## 2020-02-11 NOTE — Assessment & Plan Note (Signed)
Improved with 2 rounds of Clindamycin and WBC improved. Patient will report if symptoms return for further consideration

## 2020-02-11 NOTE — Progress Notes (Signed)
Subjective:    Patient ID: Alexis Olson, female    DOB: 12-22-1933, 86 y.o.   MRN: [id number]  Chief Complaint  Patient presents with  . Annual Exam    HPI Patient is in today for annual preventative exam and follow up on chronic medical concerns. She is noting her left foot pain has resolved since 2 courses of Clindamycin. Her diarrhea has worsened since the antibiotics. No bloody or tarry stool. No fevers or chills. She does note fatigue and myalgias as well as arthritic pain in left knee. She is trying to eat well but she is not exercising much. Denies CP/palp/SOB/HA/congestion/fevers or GU c/o. Taking meds as prescribed  Past Medical History:  Diagnosis Date  . ALLERGIC RHINITIS   . ANEMIA, CHRONIC    Malabsorption related to bypass hx, B12 and iron deficiency  . ANXIETY   . Arthritis   . ASTHMA   . Bariatric surgery status   . Bowel perforation ([place]) 2016  . C. difficile diarrhea   . Colon perforation ([place]) 08/2014   following polypetomy during colo, surgical repair  . COLONIC POLYPS, ADENOMATOUS, HX OF 2010  . DIVERTICULOSIS, COLON   . DVT (deep venous thrombosis) ([place]) 2018  . DVT (deep venous thrombosis) ([place])   . GERD (gastroesophageal reflux disease) 08/23/2015  . Graded compression stocking in place   . History of blood transfusion   . History of migraine   . History of rheumatic fever   . HSV (herpes simplex virus) infection 08/16/2015  . Hyperlipidemia   . Hypertension    Denies  . Hyponatremia 05/02/2016  . Incomplete left bundle branch block (LBBB) 2016   Noted on EKG  . INSOMNIA   . Lumbosacral disc disease    Chronic pain; History of osteomyelitis 2010 following ESI complication  . Obesity   . Osteomyelitis ([place])   . Pelvic fracture ([place])   . Pneumonia   . Recurrent UTI 08/23/2015  . URINARY URGENCY   . Ventral hernia without obstruction or gangrene 05/22/2018  . VITAMIN B12 DEFICIENCY     Past Surgical History:  Procedure Laterality Date  . [place]   for ruptured disc  . CHOLECYSTECTOMY    . COLONOSCOPY    . greenfield filter    . ileojejunal bypass  1976   for obesity  . LAPAROTOMY N/A 09/18/2014   Procedure: EXPLORATORY LAPAROTOMY WITH REPAIR OF CECIAL PERFORATION;  Surgeon: Autumn Messing III, MD;  Location: [facility];  Service: General;  Laterality: N/A;  . TONSILLECTOMY    . TOTAL KNEE ARTHROPLASTY Right 08/26/2019   Procedure: RIGHT TOTAL KNEE ARTHROPLASTY, LEFT KNEE CORTISONE INJECTION;  Surgeon: Paralee Cancel, MD;  Location: [facility];  Service: Orthopedics;  Laterality: Right;  70 MINS  . UPPER GI ENDOSCOPY    . VASCULAR SURGERY Right    revascularization right lower extremity    Family History  Problem Relation Age of Onset  . Cervical cancer Mother   . Stroke Mother   . Liver disease Sister   . Kidney disease Sister   . Cirrhosis Sister   . Colon cancer Neg Hx   . Esophageal cancer Neg Hx   . Rectal cancer Neg Hx   . Stomach cancer Neg Hx     Social History   Socioeconomic History  . Marital status: Married    Spouse name: Not on file  . Number of children: 3  . Years of education: Not on file  . Highest  education level: Not on file  Occupational History  . Occupation: retired Programmer, multimedia: RETIRED  Tobacco Use  . Smoking status: Never Smoker  . Smokeless tobacco: Never Used  Substance and Sexual Activity  . Alcohol use: No    Alcohol/week: 0.0 standard drinks  . Drug use: No  . Sexual activity: Never    Partners: Male  Other Topics Concern  . Not on file  Social History Narrative   Lives with spouse, Indep ADLs   Supportive family nearby   No dietary restrictions   Social Determinants of Health   Financial Resource Strain:   . Difficulty of Paying Living Expenses: Not on file  Food Insecurity:   . Worried About Charity fundraiser in the Last Year: Not on file  . Ran Out of Food in the Last Year: Not on file  Transportation Needs:   . Lack of Transportation (Medical): Not on file  .  Lack of Transportation (Non-Medical): Not on file  Physical Activity:   . Days of Exercise per Week: Not on file  . Minutes of Exercise per Session: Not on file  Stress:   . Feeling of Stress : Not on file  Social Connections:   . Frequency of Communication with Friends and Family: Not on file  . Frequency of Social Gatherings with Friends and Family: Not on file  . Attends Religious Services: Not on file  . Active Member of Clubs or Organizations: Not on file  . Attends Archivist Meetings: Not on file  . Marital Status: Not on file  Intimate Partner Violence:   . Fear of Current or Ex-Partner: Not on file  . Emotionally Abused: Not on file  . Physically Abused: Not on file  . Sexually Abused: Not on file    Outpatient Medications Prior to Visit  Medication Sig Dispense Refill  . acetaminophen (TYLENOL) 500 MG tablet Take 2 tablets (1,000 mg total) by mouth every 8 (eight) hours. 30 tablet 0  . albuterol (PROVENTIL HFA;VENTOLIN HFA) 108 (90 BASE) MCG/ACT inhaler Inhale 1-2 puffs into the lungs every 6 (six) hours as needed for wheezing or shortness of breath.    . ALPRAZolam (XANAX) 1 MG tablet Take 0.5-1 tablets (0.5-1 mg total) by mouth at bedtime as needed. 90 tablet 1  . amitriptyline (ELAVIL) 100 MG tablet Take 1 tablet (100 mg total) by mouth daily. 90 tablet 1  . apixaban (ELIQUIS) 5 MG TABS tablet Take 1 tablet (5 mg total) by mouth 2 (two) times daily. 60 tablet 3  . azelastine (OPTIVAR) 0.05 % ophthalmic solution Place 1 drop into both eyes daily as needed (for dry eyes).     . benzonatate (TESSALON) 100 MG capsule Take 1 capsule (100 mg total) by mouth 3 (three) times daily as needed for cough. 30 capsule 0  . budesonide-formoterol (SYMBICORT) 160-4.5 MCG/ACT inhaler Inhale 2 puffs into the lungs See admin instructions. Inhale 2 puffs into the lungs one to two times a day 1 Inhaler 2  . cetirizine (ZYRTEC) 10 MG tablet Take 10 mg by mouth daily.     .  cyanocobalamin (,VITAMIN B-12,) 1000 MCG/ML injection Inject 1cc into the muscle every month 10 mL 1  . dicyclomine (BENTYL) 10 MG capsule TAKE 1 CAPSULE BY MOUTH 3 TIMES DAILY AS NEEDED FOR DIARRHEA AND SYMPTOMS 270 capsule 3  . diphenoxylate-atropine (LOMOTIL) 2.5-0.025 MG tablet TAKE 2 TABLETS BY MOUTH THREE TIMES DAILY AS NEEDED FOR DIARRHEA 150 tablet 0  .  docusate sodium (COLACE) 100 MG capsule Take 1 capsule (100 mg total) by mouth 2 (two) times daily. (Patient taking differently: Take 100 mg by mouth daily as needed for mild constipation. ) 28 capsule 0  . Fe Fum-FA-B Cmp-C-Zn-Mg-Mn-Cu (HEMOCYTE PLUS) 106-1 MG CAPS TAKE 1 CAPSULE BY MOUTH 2 TIMES DAILY. 60 capsule 1  . ferrous sulfate 325 (65 FE) MG tablet Take 1 tablet (325 mg total) by mouth 2 (two) times daily with a meal. 60 tablet 0  . fluticasone (FLONASE) 50 MCG/ACT nasal spray 2 sprays by Nasal route daily.      Marland Kitchen gabapentin (NEURONTIN) 300 MG capsule TAKE 1 CAPSULE BY MOUTH AT BEDTIME (Patient taking differently: Take 300 mg by mouth at bedtime. ) 90 capsule 1  . hydrOXYzine (ATARAX) 25 MG tablet Take 25 mg by mouth 3 (three) times daily as needed for itching.     . levocetirizine (XYZAL) 5 MG tablet Take 1 tablet (5 mg total) by mouth every evening. 90 tablet 3  . lubiprostone (AMITIZA) 24 MCG capsule Take 24 mcg by mouth daily as needed for constipation.    . magnesium oxide (MAG-OX 400) 400 MG tablet Take 400 mg by mouth 2 (two) times daily.     . methocarbamol (ROBAXIN) 500 MG tablet Take 1 tablet (500 mg total) by mouth every 6 (six) hours as needed for muscle spasms. 40 tablet 0  . montelukast (SINGULAIR) 10 MG tablet Take 10 mg by mouth daily as needed (For allergy).     Marland Kitchen omeprazole (PRILOSEC) 20 MG capsule TAKE 1 CAPSULE BY MOUTH DAILY. (Patient taking differently: Take 20 mg by mouth daily. ) 90 capsule 3  . ondansetron (ZOFRAN-ODT) 4 MG disintegrating tablet Take 1 tablet (4 mg total) by mouth 3 (three) times daily as  needed for nausea or vomiting. 20 tablet 0  . oxyCODONE (OXY IR/ROXICODONE) 5 MG immediate release tablet Take 1 tablet (5 mg total) by mouth every 6 (six) hours as needed for severe pain. 10 tablet 0  . potassium chloride SA (KLOR-CON) 20 MEQ tablet TAKE 1 TABLET BY MOUTH ONCE DAILY (Patient taking differently: Take 20 mEq by mouth daily. ) 90 tablet 1  . Probiotic Product (PROBIOTIC DAILY PO) Take 1 capsule by mouth daily.    . tamsulosin (FLOMAX) 0.4 MG CAPS capsule Take 1 capsule (0.4 mg total) by mouth daily after supper. 30 capsule 0  . valACYclovir (VALTREX) 500 MG tablet Take 1 tablet (500 mg total) by mouth daily as needed. 30 tablet 5  . Vitamin D, Ergocalciferol, (DRISDOL) 1.25 MG (50000 UNIT) CAPS capsule TAKE 1 CAPSULE (50,000 UNITS TOTAL) BY MOUTH EVERY 7 (SEVEN) DAYS. 12 capsule 4  . zolpidem (AMBIEN) 5 MG tablet TAKE 1 TABLET BY MOUTH AT BEDTIME (Patient taking differently: Take 5 mg by mouth at bedtime. ) 90 tablet 1  . clindamycin (CLEOCIN) 300 MG capsule Take 1 capsule (300 mg total) by mouth 3 (three) times daily. 15 capsule 0  . clindamycin (CLEOCIN) 300 MG capsule Take 1 capsule (300 mg total) by mouth 3 (three) times daily. 15 capsule 0   No facility-administered medications prior to visit.    Allergies  Allergen Reactions  . Ativan [Lorazepam] Anaphylaxis and Other (See Comments)    Deathly allergic; "resp rate dropped dropped to 6"  . Other Anaphylaxis    Red peppers--choking, also  . Azithromycin Diarrhea  . Benadryl [Diphenhydramine Hcl] Other (See Comments)    Paradoxical response  . Ciprofloxacin Hcl Other (See  Comments)    Causes yeast infection and refuses to take  . Codeine Phosphate Nausea Only    Can tolerate in limited amounts  . Hydrocodone Itching and Nausea And Vomiting  . Hydrocodone-Homatropine Itching and Nausea And Vomiting  . Levaquin [Levofloxacin In D5w] Other (See Comments)    Muscle soreness  . Levofloxacin   . Meperidine Hcl Other (See  Comments)    Reaction ??  . Molds & Smuts Other (See Comments)    unsure  . Sulfa Antibiotics Other (See Comments)    Joint pain  . Tape Other (See Comments)    No Coban wrap (per the patient)  . Tramadol   . Ultram [Tramadol Hcl] Other (See Comments)    "Can't move joints"    Review of Systems  Constitutional: Positive for malaise/fatigue. Negative for chills and fever.  HENT: Negative for congestion and hearing loss.   Eyes: Negative for discharge.  Respiratory: Negative for cough, sputum production and shortness of breath.   Cardiovascular: Negative for chest pain, palpitations and leg swelling.  Gastrointestinal: Positive for diarrhea. Negative for abdominal pain, blood in stool, constipation, heartburn, nausea and vomiting.  Genitourinary: Negative for dysuria, frequency, hematuria and urgency.  Musculoskeletal: Positive for joint pain and myalgias. Negative for back pain and falls.  Skin: Negative for rash.  Neurological: Negative for dizziness, sensory change, loss of consciousness, weakness and headaches.  Endo/Heme/Allergies: Negative for environmental allergies. Does not bruise/bleed easily.  Psychiatric/Behavioral: Negative for depression and suicidal ideas. The patient is nervous/anxious and has insomnia.        Objective:    Physical Exam Constitutional:      General: She is not in acute distress.    Appearance: She is not diaphoretic.  HENT:     Head: Normocephalic and atraumatic.     Right Ear: External ear normal.     Left Ear: External ear normal.     Nose: Nose normal.     Mouth/Throat:     Pharynx: No oropharyngeal exudate.  Eyes:     General: No scleral icterus.       Right eye: No discharge.        Left eye: No discharge.     Conjunctiva/sclera: Conjunctivae normal.     Pupils: Pupils are equal, round, and reactive to light.  Neck:     Thyroid: No thyromegaly.  Cardiovascular:     Rate and Rhythm: Normal rate. Rhythm irregular.     Heart  sounds: Normal heart sounds. No murmur heard.   Pulmonary:     Effort: Pulmonary effort is normal. No respiratory distress.     Breath sounds: Normal breath sounds. No wheezing or rales.  Abdominal:     General: Bowel sounds are normal. There is no distension.     Palpations: Abdomen is soft. There is no mass.     Tenderness: There is no abdominal tenderness.  Musculoskeletal:        General: No tenderness. Normal range of motion.     Cervical back: Normal range of motion and neck supple.  Lymphadenopathy:     Cervical: No cervical adenopathy.  Skin:    General: Skin is warm and dry.     Findings: No rash.  Neurological:     Mental Status: She is alert and oriented to person, place, and time.     Cranial Nerves: No cranial nerve deficit.     Coordination: Coordination normal.     Deep Tendon Reflexes: Reflexes are normal and  symmetric. Reflexes normal.     BP 104/66   Pulse 60   Resp 16   Ht 5\' 5"  (1.651 m)   Wt 171 lb 9.6 oz (77.8 kg)   BMI 28.56 kg/m  Wt Readings from Last 3 Encounters:  02/10/20 171 lb 9.6 oz (77.8 kg)  01/27/20 172 lb 9.6 oz (78.3 kg)  11/27/19 170 lb 12.8 oz (77.5 kg)    Diabetic Foot Exam - Simple   No data filed     Lab Results  Component Value Date   WBC 7.0 02/10/2020   HGB 10.8 (L) 02/10/2020   HCT 32.7 (L) 02/10/2020   PLT 294.0 02/10/2020   GLUCOSE 82 02/10/2020   CHOL 126 02/10/2020   TRIG 131.0 02/10/2020   HDL 45.70 02/10/2020   LDLDIRECT 76.0 10/04/2018   LDLCALC 54 02/10/2020   ALT 7 02/10/2020   AST 12 02/10/2020   NA 136 02/10/2020   K 4.1 02/10/2020   CL 101 02/10/2020   CREATININE 0.50 02/10/2020   BUN 16 02/10/2020   CO2 29 02/10/2020   TSH 1.05 02/10/2020   INR 1.01 05/13/2009   HGBA1C 5.0 02/10/2020    Lab Results  Component Value Date   TSH 1.05 02/10/2020   Lab Results  Component Value Date   WBC 7.0 02/10/2020   HGB 10.8 (L) 02/10/2020   HCT 32.7 (L) 02/10/2020   MCV 92.2 02/10/2020   PLT 294.0  02/10/2020   Lab Results  Component Value Date   NA 136 02/10/2020   K 4.1 02/10/2020   CO2 29 02/10/2020   GLUCOSE 82 02/10/2020   BUN 16 02/10/2020   CREATININE 0.50 02/10/2020   BILITOT 0.3 02/10/2020   ALKPHOS 44 02/10/2020   AST 12 02/10/2020   ALT 7 02/10/2020   PROT 6.3 02/10/2020   ALBUMIN 3.8 02/10/2020   CALCIUM 8.9 02/10/2020   ANIONGAP 12 11/16/2019   GFR 85.91 02/10/2020   Lab Results  Component Value Date   CHOL 126 02/10/2020   Lab Results  Component Value Date   HDL 45.70 02/10/2020   Lab Results  Component Value Date   LDLCALC 54 02/10/2020   Lab Results  Component Value Date   TRIG 131.0 02/10/2020   Lab Results  Component Value Date   CHOLHDL 3 02/10/2020   Lab Results  Component Value Date   HGBA1C 5.0 02/10/2020       Assessment & Plan:   Problem List Items Addressed This Visit    Vitamin B 12 deficiency - Primary    Supplement and monitor      Relevant Orders   Vitamin B12 (Completed)   Intrinsic Factor Antibodies   Essential hypertension    Well controlled, no changes to meds. Encouraged heart healthy diet such as the DASH diet and exercise as tolerated.       Relevant Orders   TSH (Completed)   Chronic diarrhea   Relevant Orders   Clostridium difficile Toxin B, Qualitative, Real-Time PCR([facility])   Stool Culture   Stool, WBC/Lactoferrin   Osteoporosis    Encouraged to get adequate exercise, calcium and vitamin d intake      Hyperlipidemia, mild    Encouraged heart healthy diet, increase exercise, avoid trans fats, consider a krill oil cap daily      Relevant Orders   Lipid panel (Completed)   Hyponatremia   Relevant Orders   Comprehensive metabolic panel (Completed)   Preventative health care    Patient encouraged to maintain  heart healthy diet, regular exercise, adequate sleep. Consider daily probiotics. Take medications as prescribed. Labs ordered and reviewed.       DVT (deep venous thrombosis) (HCC)    Hypomagnesemia    Supplement and monitor      Relevant Orders   Magnesium (Completed)   Diarrhea    Has worsened again with recent course of clindamycin and she has numerous indoor outdoor cats. Will check stool for Cdif and cultures.       Hyperglycemia    hgba1c acceptable, minimize simple carbs. Increase exercise as tolerated.       Relevant Orders   Hemoglobin A1c (Completed)   Gout    Hydrate and monitor uric acid levels      Relevant Orders   Uric acid (Completed)   Left foot pain    Improved with 2 rounds of Clindamycin and WBC improved. Patient will report if symptoms return for further consideration       Other Visit Diagnoses    Elevated sed rate       Relevant Orders   Sedimentation rate (Completed)   Foot pain, left       Relevant Orders   CBC with Differential/Platelet (Completed)   Uric acid (Completed)      I have discontinued Renaldo Fiddler. Robards's clindamycin and clindamycin. I am also having her maintain her fluticasone, hydrOXYzine, magnesium oxide, azelastine, montelukast, cetirizine, levocetirizine, albuterol, budesonide-formoterol, benzonatate, omeprazole, gabapentin, Probiotic Product (PROBIOTIC DAILY PO), lubiprostone, docusate sodium, methocarbamol, ondansetron, acetaminophen, potassium chloride SA, amitriptyline, Vitamin D (Ergocalciferol), zolpidem, cyanocobalamin, ferrous sulfate, tamsulosin, ALPRAZolam, dicyclomine, oxyCODONE, apixaban, diphenoxylate-atropine, valACYclovir, and Hemocyte Plus.  No orders of the defined types were placed in this encounter.    Penni Homans, MD

## 2020-02-11 NOTE — Assessment & Plan Note (Signed)
Well controlled, no changes to meds. Encouraged heart healthy diet such as the DASH diet and exercise as tolerated.  °

## 2020-02-11 NOTE — Assessment & Plan Note (Signed)
Supplement and monitor 

## 2020-02-11 NOTE — Assessment & Plan Note (Signed)
Patient encouraged to maintain heart healthy diet, regular exercise, adequate sleep. Consider daily probiotics. Take medications as prescribed. Labs ordered and reviewed 

## 2020-02-11 NOTE — Assessment & Plan Note (Signed)
Encouraged to get adequate exercise, calcium and vitamin d intake 

## 2020-02-11 NOTE — Assessment & Plan Note (Signed)
Has worsened again with recent course of clindamycin and she has numerous indoor outdoor cats. Will check stool for Cdif and cultures.

## 2020-02-11 NOTE — Assessment & Plan Note (Signed)
hgba1c acceptable, minimize simple carbs. Increase exercise as tolerated.  

## 2020-02-11 NOTE — Assessment & Plan Note (Signed)
Encouraged heart healthy diet, increase exercise, avoid trans fats, consider a krill oil cap daily 

## 2020-02-11 NOTE — Assessment & Plan Note (Signed)
Hydrate and monitor uric acid levels

## 2020-02-12 ENCOUNTER — Other Ambulatory Visit: Payer: Self-pay

## 2020-02-12 DIAGNOSIS — D649 Anemia, unspecified: Secondary | ICD-10-CM

## 2020-02-12 LAB — INTRINSIC FACTOR ANTIBODIES: Intrinsic Factor: NEGATIVE

## 2020-02-12 NOTE — Progress Notes (Signed)
I spoke with the patient about this. Pt lab are ordered

## 2020-02-17 MED FILL — ELIQUIS 5 MG TABLET: 5 | 30 days supply | Qty: 60 | Fill #1

## 2020-02-20 ENCOUNTER — Other Ambulatory Visit: Payer: Self-pay | Admitting: Family Medicine

## 2020-02-21 ENCOUNTER — Other Ambulatory Visit: Payer: Self-pay | Admitting: Family Medicine

## 2020-02-21 MED FILL — GABAPENTIN 300 MG CAPSULE: 300 | 90 days supply | Qty: 90 | Fill #0

## 2020-02-23 ENCOUNTER — Other Ambulatory Visit (INDEPENDENT_AMBULATORY_CARE_PROVIDER_SITE_OTHER): Payer: PPO

## 2020-02-23 ENCOUNTER — Other Ambulatory Visit: Payer: Self-pay

## 2020-02-23 DIAGNOSIS — K529 Noninfective gastroenteritis and colitis, unspecified: Secondary | ICD-10-CM

## 2020-02-23 NOTE — Addendum Note (Signed)
Addended by: Kelle Darting A on: 02/23/2020 04:00 PM   Modules accepted: Orders

## 2020-02-25 ENCOUNTER — Telehealth: Payer: Self-pay

## 2020-02-25 ENCOUNTER — Other Ambulatory Visit: Payer: Self-pay | Admitting: Internal Medicine

## 2020-02-25 ENCOUNTER — Telehealth: Payer: Self-pay | Admitting: *Deleted

## 2020-02-25 ENCOUNTER — Other Ambulatory Visit: Payer: Self-pay | Admitting: Family Medicine

## 2020-02-25 LAB — FECAL LACTOFERRIN, QUANT
Fecal Lactoferrin: NEGATIVE
MICRO NUMBER:: 11252031
SPECIMEN QUALITY:: ADEQUATE

## 2020-02-25 LAB — CLOSTRIDIUM DIFFICILE TOXIN B, QUALITATIVE, REAL-TIME PCR: Toxigenic C. Difficile by PCR: NOT DETECTED

## 2020-02-25 MED FILL — DIPHENOXYLATE-ATROPINE 2.5-: 2.5-0.025 | 25 days supply | Qty: 150 | Fill #0

## 2020-02-25 NOTE — Telephone Encounter (Signed)
[facility] called but hung up before we could get on.  I believe they were calling about her stool test results.  Positive for shiga toxin.

## 2020-02-25 NOTE — Telephone Encounter (Signed)
Received a fax with detected result from [facility]. In providers box and forwarded to pcp for advise

## 2020-02-25 NOTE — Telephone Encounter (Signed)
I cannot find any results.  Deferred to PCP

## 2020-02-25 NOTE — Telephone Encounter (Signed)
[facility] is requesting a refill on patient's generic Lomotil Sir, please advise, thank you.

## 2020-02-25 NOTE — Telephone Encounter (Signed)
I refilled it.  Somehow they sent the refill to my wife instead of me.

## 2020-02-25 NOTE — Telephone Encounter (Signed)
Shiga Toxin comes from [place] in the colon. Everyone has some Ecoli so it does not routinely get treated. If patient is highly symptomatic with 6 or more watery and/or bloody diarrheas a day, some vomiting, fevers, abdominal pain, fatigue, syncope, signs of dehydration we can monitor. Her labs on Nov 16 were ok but we need to repeat them unless she is sick enough to go to ER. So if she is having more than 4 loose stool a day especially if they are bloody, she is having abdominal pain, fatigue and/or weakness and/or fevers she needs to go to ER for evaluation and IVF. If she is not having those symptoms. Have her do a cbc with diff and cmp and then if those are worsening she needs to go to ER. She needs to increase fluid intake as well.

## 2020-02-26 ENCOUNTER — Other Ambulatory Visit: Payer: Self-pay

## 2020-02-26 ENCOUNTER — Telehealth: Payer: Self-pay

## 2020-02-26 ENCOUNTER — Other Ambulatory Visit (INDEPENDENT_AMBULATORY_CARE_PROVIDER_SITE_OTHER): Payer: PPO

## 2020-02-26 DIAGNOSIS — A09 Infectious gastroenteritis and colitis, unspecified: Secondary | ICD-10-CM

## 2020-02-26 DIAGNOSIS — R3915 Urgency of urination: Secondary | ICD-10-CM

## 2020-02-26 DIAGNOSIS — R309 Painful micturition, unspecified: Secondary | ICD-10-CM

## 2020-02-26 LAB — COMPREHENSIVE METABOLIC PANEL
ALT: 8 U/L (ref 0–35)
AST: 12 U/L (ref 0–37)
Albumin: 3.9 g/dL (ref 3.5–5.2)
Alkaline Phosphatase: 50 U/L (ref 39–117)
BUN: 15 mg/dL (ref 6–23)
CO2: 30 mEq/L (ref 19–32)
Calcium: 8.6 mg/dL (ref 8.4–10.5)
Chloride: 96 mEq/L (ref 96–112)
Creatinine, Ser: 0.57 mg/dL (ref 0.40–1.20)
GFR: 83.21 mL/min (ref >60.00)
Glucose, Bld: 78 mg/dL (ref 70–99)
Potassium: 3.7 mEq/L (ref 3.5–5.1)
Sodium: 130 mEq/L — ABNORMAL LOW (ref 135–145)
Total Bilirubin: 0.3 mg/dL (ref 0.2–1.2)
Total Protein: 6.9 g/dL (ref 6.0–8.3)

## 2020-02-26 LAB — SALMONELLA/SHIGELLA CULT, CAMPY EIA AND SHIGA TOXIN RFL ECOLI
MICRO NUMBER: 11251906
MICRO NUMBER:: 11251907
MICRO NUMBER:: 11251908
Result:: NOT DETECTED
SPECIMEN QUALITY: ADEQUATE
SPECIMEN QUALITY:: ADEQUATE
SPECIMEN QUALITY:: ADEQUATE

## 2020-02-26 LAB — CBC WITH DIFFERENTIAL/PLATELET
Basophils Absolute: 0 10*3/uL (ref 0.0–0.1)
Basophils Relative: 0.5 % (ref 0.0–3.0)
Eosinophils Absolute: 0.2 10*3/uL (ref 0.0–0.7)
Eosinophils Relative: 2.3 % (ref 0.0–5.0)
HCT: 34.6 % — ABNORMAL LOW (ref 36.0–46.0)
Hemoglobin: 11.4 g/dL — ABNORMAL LOW (ref 12.0–15.0)
Lymphocytes Relative: 28.2 % (ref 12.0–46.0)
Lymphs Abs: 2.4 10*3/uL (ref 0.7–4.0)
MCHC: 33 g/dL (ref 30.0–36.0)
MCV: 91.9 fl (ref 78.0–100.0)
Monocytes Absolute: 0.8 10*3/uL (ref 0.1–1.0)
Monocytes Relative: 8.9 % (ref 3.0–12.0)
Neutro Abs: 5.2 10*3/uL (ref 1.4–7.7)
Neutrophils Relative %: 60.1 % (ref 43.0–77.0)
Platelets: 250 10*3/uL (ref 150.0–400.0)
RBC: 3.76 Mil/uL — ABNORMAL LOW (ref 3.87–5.11)
RDW: 15 % (ref 11.5–15.5)
WBC: 8.7 10*3/uL (ref 4.0–10.5)

## 2020-02-26 LAB — E COLI O157 CULTURE
MICRO NUMBER:: 11259302
SPECIMEN QUALITY:: ADEQUATE

## 2020-02-26 NOTE — Progress Notes (Signed)
[facility] is closer to her house

## 2020-02-26 NOTE — Addendum Note (Signed)
Addended by: Corinna Capra D on: 02/26/2020 08:47 AM   Modules accepted: Orders

## 2020-02-26 NOTE — Telephone Encounter (Signed)
Pt reports UTI sx. Stated she has urgency and pain, some weakness and tiredness. Informed her of your recommendations and to be seen if she is feeling sick pt stated she needs something for UTI. UA added to lab order to get labs at [facility] later today

## 2020-02-26 NOTE — Telephone Encounter (Signed)
Thank you for adding UA, add a culture too and document what her symptoms are please ie urgency, frequency etc. TY

## 2020-02-26 NOTE — Progress Notes (Signed)
c 

## 2020-02-26 NOTE — Addendum Note (Signed)
Addended by: Corinna Capra D on: 02/26/2020 08:27 AM   Modules accepted: Orders

## 2020-02-26 NOTE — Telephone Encounter (Signed)
Called pt she is going to see lab at [facility] today

## 2020-02-27 ENCOUNTER — Other Ambulatory Visit: Payer: Self-pay | Admitting: Family Medicine

## 2020-02-27 LAB — URINALYSIS, ROUTINE W REFLEX MICROSCOPIC
Bilirubin Urine: NEGATIVE
Ketones, ur: NEGATIVE
Nitrite: POSITIVE — AB
Specific Gravity, Urine: 1.01 (ref 1.000–1.030)
Urine Glucose: NEGATIVE
Urobilinogen, UA: 0.2 (ref 0.0–1.0)
pH: 6 (ref 5.0–8.0)

## 2020-02-27 MED ORDER — AMOXICILLIN-POT CLAVULANATE 875-125 MG PO TABS: 1.0000 | ORAL_TABLET | Freq: Two times a day (BID) | ORAL | 0 refills | Status: DC

## 2020-02-27 MED FILL — AMOX-CLAV 875-125 MG TABLET: 875-125 | 5 days supply | Qty: 10 | Fill #0

## 2020-02-27 NOTE — Telephone Encounter (Signed)
Patient is calling to check the status of medication. 

## 2020-02-27 NOTE — Telephone Encounter (Signed)
She does not have salmonella, she has got Ecoli but it is not the dangerous 0157 type. I have sent in Augmentin

## 2020-02-27 NOTE — Telephone Encounter (Signed)
Patient states she needs something for urinary tract infection and salmonella infection sent to pharmacy.  Please Advise

## 2020-02-27 NOTE — Telephone Encounter (Signed)
Patient stated augmentin is not going to work for her.  It is not good on her stomach.  Can something else be called in for her in its place?

## 2020-02-27 NOTE — Telephone Encounter (Signed)
Urine resulted this morning

## 2020-02-27 NOTE — Telephone Encounter (Signed)
Can this pt get something orders for UTI.

## 2020-02-28 ENCOUNTER — Other Ambulatory Visit: Payer: Self-pay | Admitting: Family Medicine

## 2020-02-28 MED ORDER — CEFDINIR 300 MG PO CAPS: 300.0000 mg | ORAL_CAPSULE | Freq: Two times a day (BID) | ORAL | 0 refills | Status: DC

## 2020-02-28 MED FILL — CEFDINIR 300 MG CAPS: 300 | 7 days supply | Qty: 14 | Fill #0

## 2020-02-28 NOTE — Telephone Encounter (Signed)
I will send in Cefdinir

## 2020-03-08 ENCOUNTER — Other Ambulatory Visit: Payer: Self-pay | Admitting: Family Medicine

## 2020-03-08 MED ORDER — CEPHALEXIN 500 MG PO CAPS: 500.0000 mg | ORAL_CAPSULE | Freq: Three times a day (TID) | ORAL | 0 refills | Status: DC

## 2020-03-08 NOTE — Telephone Encounter (Signed)
Do you her to bring another urine?

## 2020-03-08 NOTE — Telephone Encounter (Signed)
Patient states she finished her medication on Saturday. Patient states she would like some antibiotic. Her uti has worsen

## 2020-03-08 NOTE — Telephone Encounter (Signed)
I sent in Cephalexin but she should also do a UA with culture, cmp and cbc with diff if she is worse. Please arrange

## 2020-03-09 DIAGNOSIS — I82409 Acute embolism and thrombosis of unspecified deep veins of unspecified lower extremity: Secondary | ICD-10-CM | POA: Diagnosis not present

## 2020-03-09 DIAGNOSIS — E871 Hypo-osmolality and hyponatremia: Secondary | ICD-10-CM | POA: Diagnosis not present

## 2020-03-09 DIAGNOSIS — D638 Anemia in other chronic diseases classified elsewhere: Secondary | ICD-10-CM | POA: Diagnosis not present

## 2020-03-09 DIAGNOSIS — E785 Hyperlipidemia, unspecified: Secondary | ICD-10-CM | POA: Diagnosis not present

## 2020-03-09 MED FILL — CEPHALEXIN 500 MG CAPSULE: 500 | 7 days supply | Qty: 21 | Fill #0

## 2020-03-09 NOTE — Telephone Encounter (Signed)
Left message on machine to call back  

## 2020-03-09 NOTE — Telephone Encounter (Signed)
Patient notified that rx was sent in and to let us know if she does not get better.

## 2020-03-11 ENCOUNTER — Telehealth: Payer: Self-pay | Admitting: Family Medicine

## 2020-03-11 NOTE — Telephone Encounter (Signed)
Patient states she would like call back in reference to lab results.

## 2020-03-11 NOTE — Telephone Encounter (Signed)
Patient states a remote nurse took lab (Tuesday) she would like to know the results. Patient states she is urinary every 40 minutes.

## 2020-03-12 NOTE — Telephone Encounter (Signed)
Patient notified that we have not received anything yet.  She will call them to get [place] the results.

## 2020-03-15 DIAGNOSIS — J4530 Mild persistent asthma, uncomplicated: Secondary | ICD-10-CM | POA: Diagnosis not present

## 2020-03-15 DIAGNOSIS — J3081 Allergic rhinitis due to animal (cat) (dog) hair and dander: Secondary | ICD-10-CM | POA: Diagnosis not present

## 2020-03-15 DIAGNOSIS — J301 Allergic rhinitis due to pollen: Secondary | ICD-10-CM | POA: Diagnosis not present

## 2020-03-15 DIAGNOSIS — J3089 Other allergic rhinitis: Secondary | ICD-10-CM | POA: Diagnosis not present

## 2020-03-18 DIAGNOSIS — M1712 Unilateral primary osteoarthritis, left knee: Secondary | ICD-10-CM | POA: Diagnosis not present

## 2020-03-22 MED FILL — ELIQUIS 5 MG TABLET: 5 | 30 days supply | Qty: 60 | Fill #2

## 2020-03-24 ENCOUNTER — Other Ambulatory Visit: Payer: Self-pay | Admitting: Family Medicine

## 2020-03-24 MED FILL — ALPRAZOLAM 1 MG TABS: 1 | 90 days supply | Qty: 90 | Fill #0

## 2020-03-24 MED FILL — POTASSIUM CHLORIDE CRYS ER: 20 | 90 days supply | Qty: 90 | Fill #0

## 2020-03-29 ENCOUNTER — Other Ambulatory Visit: Payer: Self-pay | Admitting: Family Medicine

## 2020-03-29 MED FILL — OMEPRAZOLE 20 MG CAP: 20 | 90 days supply | Qty: 90 | Fill #3

## 2020-03-29 MED FILL — HEMOCYTE PLUS CAPSULE: 106-1 | 30 days supply | Qty: 60 | Fill #1

## 2020-03-29 MED FILL — AMITRIPTYLINE HCL 100 MG TA: 100 | 90 days supply | Qty: 90 | Fill #0

## 2020-03-30 ENCOUNTER — Telehealth: Payer: Self-pay | Admitting: Family Medicine

## 2020-03-30 DIAGNOSIS — R8290 Unspecified abnormal findings in urine: Secondary | ICD-10-CM

## 2020-03-30 DIAGNOSIS — R300 Dysuria: Secondary | ICD-10-CM

## 2020-03-30 DIAGNOSIS — R309 Painful micturition, unspecified: Secondary | ICD-10-CM

## 2020-03-30 NOTE — Telephone Encounter (Signed)
Patient is calling in reference to her urinary frequency and would like to speak with a member of [employer].

## 2020-03-30 NOTE — Telephone Encounter (Signed)
Patient states she still experiencing frequency urination. She would like to speak to you

## 2020-03-31 ENCOUNTER — Other Ambulatory Visit: Payer: Self-pay | Admitting: Family Medicine

## 2020-03-31 ENCOUNTER — Other Ambulatory Visit (INDEPENDENT_AMBULATORY_CARE_PROVIDER_SITE_OTHER): Payer: PPO

## 2020-03-31 DIAGNOSIS — M79604 Pain in right leg: Secondary | ICD-10-CM

## 2020-03-31 DIAGNOSIS — R300 Dysuria: Secondary | ICD-10-CM | POA: Diagnosis not present

## 2020-03-31 DIAGNOSIS — R8290 Unspecified abnormal findings in urine: Secondary | ICD-10-CM

## 2020-03-31 NOTE — Telephone Encounter (Signed)
Patient states she would like a call back in reference to urinary complaint.   Patient states she also has left leg pain and wold like an ultrasound or xray.

## 2020-03-31 NOTE — Telephone Encounter (Signed)
Thank you for ordering the Urine test. I did go ahead and order her an ultrasound of her leg. She will have to do that at [facility].

## 2020-03-31 NOTE — Telephone Encounter (Signed)
Patient states that she thinks that her UTI has not went away.  She stated that "you can smell the creatinine" in it and also dysuria.  I ordered a urinalysis and culture for her cause she stated that she would be able to go over to [facility].  She is also having issues with her right knee and wanted to see if you can order a test/xray to see if clot is gone.  I asked have she called the knee surgeon to let him know and she stated that they are "slack" when she calls.  I advised her that even if they are slack she still needs to notify them as well.  Not sure if she will do so.

## 2020-04-01 ENCOUNTER — Other Ambulatory Visit: Payer: Self-pay | Admitting: Family Medicine

## 2020-04-01 DIAGNOSIS — M1712 Unilateral primary osteoarthritis, left knee: Secondary | ICD-10-CM | POA: Diagnosis not present

## 2020-04-01 LAB — URINALYSIS, ROUTINE W REFLEX MICROSCOPIC
Bilirubin Urine: NEGATIVE
Hgb urine dipstick: NEGATIVE
Ketones, ur: NEGATIVE
Nitrite: NEGATIVE
Specific Gravity, Urine: 1.01 (ref 1.000–1.030)
Total Protein, Urine: NEGATIVE
Urine Glucose: NEGATIVE
Urobilinogen, UA: 0.2 (ref 0.0–1.0)
pH: 5.5 (ref 5.0–8.0)

## 2020-04-01 MED ORDER — CEPHALEXIN 500 MG PO CAPS: 500.0000 mg | ORAL_CAPSULE | Freq: Three times a day (TID) | ORAL | 0 refills | Status: DC

## 2020-04-01 MED FILL — CEPHALEXIN 500 MG CAPSULE: 500 | 7 days supply | Qty: 21 | Fill #0

## 2020-04-01 MED FILL — VIT D2 1.25 MG (50,000 UNIT: 1.25 MG | 84 days supply | Qty: 12 | Fill #2

## 2020-04-01 NOTE — Telephone Encounter (Signed)
Patient notified

## 2020-04-02 LAB — URINE CULTURE
MICRO NUMBER:: 11385041
SPECIMEN QUALITY:: ADEQUATE

## 2020-04-06 ENCOUNTER — Other Ambulatory Visit: Payer: Self-pay | Admitting: Internal Medicine

## 2020-04-06 DIAGNOSIS — M1712 Unilateral primary osteoarthritis, left knee: Secondary | ICD-10-CM | POA: Insufficient documentation

## 2020-04-06 DIAGNOSIS — M13862 Other specified arthritis, left knee: Secondary | ICD-10-CM | POA: Diagnosis not present

## 2020-04-06 NOTE — Telephone Encounter (Signed)
Please advise, thank you.

## 2020-04-07 ENCOUNTER — Other Ambulatory Visit: Payer: Self-pay | Admitting: Internal Medicine

## 2020-04-07 MED FILL — DIPHENOXYLATE-ATROPINE 2.5-: 2.5-0.025 | 25 days supply | Qty: 150 | Fill #0

## 2020-04-08 ENCOUNTER — Other Ambulatory Visit: Payer: Self-pay | Admitting: Family Medicine

## 2020-04-08 ENCOUNTER — Ambulatory Visit (HOSPITAL_BASED_OUTPATIENT_CLINIC_OR_DEPARTMENT_OTHER)
Admission: RE | Admit: 2020-04-08 | Discharge: 2020-04-08 | Disposition: A | Discharge status: Home / Self Care | Payer: PPO | Source: Ambulatory Visit | Attending: Family Medicine | Admitting: Family Medicine

## 2020-04-08 ENCOUNTER — Other Ambulatory Visit: Payer: Self-pay

## 2020-04-08 DIAGNOSIS — I82402 Acute embolism and thrombosis of unspecified deep veins of left lower extremity: Secondary | ICD-10-CM | POA: Insufficient documentation

## 2020-04-08 DIAGNOSIS — I82512 Chronic embolism and thrombosis of left femoral vein: Secondary | ICD-10-CM | POA: Diagnosis not present

## 2020-04-08 DIAGNOSIS — M79604 Pain in right leg: Secondary | ICD-10-CM | POA: Insufficient documentation

## 2020-04-09 ENCOUNTER — Telehealth: Payer: Self-pay | Admitting: *Deleted

## 2020-04-09 ENCOUNTER — Other Ambulatory Visit: Payer: Self-pay | Admitting: Family Medicine

## 2020-04-09 DIAGNOSIS — M1712 Unilateral primary osteoarthritis, left knee: Secondary | ICD-10-CM | POA: Diagnosis not present

## 2020-04-09 DIAGNOSIS — N390 Urinary tract infection, site not specified: Secondary | ICD-10-CM

## 2020-04-09 MED ORDER — CEPHALEXIN 500 MG PO CAPS
500.0000 mg | ORAL_CAPSULE | Freq: Three times a day (TID) | ORAL | 0 refills | Status: DC
Start: 2020-04-09 — End: 2020-04-09

## 2020-04-09 MED FILL — CEPHALEXIN 500 MG CAPSULE: 500 | 7 days supply | Qty: 21 | Fill #0

## 2020-04-09 NOTE — Telephone Encounter (Signed)
I will send her in a refill on antibiotic and I will refer to urology for consultation since infection is recurrent.

## 2020-04-09 NOTE — Telephone Encounter (Signed)
Spoke with patient about her [place] results and she stated that she finished her antibiotics and she is as of this morning went to go urinate and now is having pain again.  What would the next step?  She stated that UTIs are becoming recurrent.   Advised patient that we may not get an answer today and will be back in office next Tuesday and that if she gets worse that she will need to go to an urgent care and not wait for phone call back.

## 2020-04-12 ENCOUNTER — Other Ambulatory Visit (HOSPITAL_COMMUNITY): Payer: Self-pay | Admitting: Allergy

## 2020-04-12 MED FILL — MONTELUKAST SOD 10 MG TAB: 10 | 90 days supply | Qty: 90 | Fill #0

## 2020-04-13 ENCOUNTER — Other Ambulatory Visit (HOSPITAL_COMMUNITY): Payer: Self-pay | Admitting: Allergy

## 2020-04-13 DIAGNOSIS — M1712 Unilateral primary osteoarthritis, left knee: Secondary | ICD-10-CM | POA: Diagnosis not present

## 2020-04-13 NOTE — Telephone Encounter (Signed)
Patient notified

## 2020-04-16 ENCOUNTER — Other Ambulatory Visit: Payer: Self-pay | Admitting: Family Medicine

## 2020-04-16 DIAGNOSIS — M1712 Unilateral primary osteoarthritis, left knee: Secondary | ICD-10-CM | POA: Diagnosis not present

## 2020-04-16 MED FILL — ZOLPIDEM TARTRATE 5 MG TAB: 5 | 90 days supply | Qty: 90 | Fill #0

## 2020-04-16 NOTE — Telephone Encounter (Signed)
Requesting: zolpidem Contract:  11/27/19 UDS: 11/27/19 Last Visit: 02/10/20 Next Visit: none Last Refill: 10/08/19  Please Advise

## 2020-04-19 MED FILL — ELIQUIS 5 MG TABLET: 5 | 30 days supply | Qty: 60 | Fill #3

## 2020-04-20 ENCOUNTER — Telehealth: Payer: Self-pay | Admitting: *Deleted

## 2020-04-20 DIAGNOSIS — M1712 Unilateral primary osteoarthritis, left knee: Secondary | ICD-10-CM | POA: Diagnosis not present

## 2020-04-20 NOTE — Telephone Encounter (Signed)
error 

## 2020-04-21 ENCOUNTER — Telehealth: Payer: Self-pay | Admitting: Family Medicine

## 2020-04-21 NOTE — Telephone Encounter (Signed)
Not sure how to order PT at [facility]. Please check with them and then place order if we can for knee pain, debility

## 2020-04-21 NOTE — Telephone Encounter (Signed)
Please advise 

## 2020-04-21 NOTE — Telephone Encounter (Signed)
Patient is requesting to continue rehab on her knee with [facility] , patient would like another referral placed .   Please advise

## 2020-04-22 ENCOUNTER — Telehealth: Payer: Self-pay | Admitting: Family Medicine

## 2020-04-22 NOTE — Telephone Encounter (Signed)
Medication: benzonatate (TESSALON) 100 MG capsule [[id number]]       Has the patient contacted their pharmacy?  (If no, request that the patient contact the pharmacy for the refill.) (If yes, when and what did the pharmacy advise?)     Preferred Pharmacy (with phone number or street name): [place], [place] - [address]  [place], [place] [address]  Phone:  [phone] Fax:  [phone]      Agent: Please be advised that RX refills may take up to 3 business days. We ask that you follow-up with your pharmacy.

## 2020-04-22 NOTE — Telephone Encounter (Signed)
Spoke with patient and advised her to contact [facility], since they see her for her knee.  She has an appt for tomorrow and she will let them know she would like to continue therapy.    Patient asked if we did covid testing.  Advised her that we did not and gave her the number to call to get scheduled.

## 2020-04-23 ENCOUNTER — Other Ambulatory Visit: Payer: Self-pay | Admitting: Family Medicine

## 2020-04-23 DIAGNOSIS — J069 Acute upper respiratory infection, unspecified: Secondary | ICD-10-CM | POA: Diagnosis not present

## 2020-04-23 DIAGNOSIS — Z20828 Contact with and (suspected) exposure to other viral communicable diseases: Secondary | ICD-10-CM | POA: Diagnosis not present

## 2020-04-23 DIAGNOSIS — M1712 Unilateral primary osteoarthritis, left knee: Secondary | ICD-10-CM | POA: Diagnosis not present

## 2020-04-23 MED ORDER — BENZONATATE 100 MG PO CAPS: 100.0000 mg | ORAL_CAPSULE | Freq: Three times a day (TID) | ORAL | 0 refills | Status: DC | PRN

## 2020-04-23 MED FILL — BENZONATATE 100 MG CAPS: 100 | 10 days supply | Qty: 30 | Fill #0

## 2020-04-23 NOTE — Telephone Encounter (Signed)
Rx sent 

## 2020-04-25 DIAGNOSIS — R058 Other specified cough: Secondary | ICD-10-CM | POA: Diagnosis not present

## 2020-04-29 ENCOUNTER — Telehealth: Payer: Self-pay | Admitting: Family Medicine

## 2020-04-29 ENCOUNTER — Telehealth: Payer: Self-pay

## 2020-04-29 ENCOUNTER — Other Ambulatory Visit: Payer: Self-pay | Admitting: Family Medicine

## 2020-04-29 NOTE — Telephone Encounter (Signed)
Attempted to call pt so I could get her set up for a referral to covid clinic

## 2020-04-29 NOTE — Telephone Encounter (Signed)
Patient states she has covid 19, patient states she would like a referral to covid infusion. Patient would also like medication sent pharmacy for covid.

## 2020-04-29 NOTE — Telephone Encounter (Signed)
Attempted to call pt to get her set up for referral for a covid clinic. LVM to call back

## 2020-04-29 NOTE — Telephone Encounter (Signed)
Pt would like medication and referral to get infusion.

## 2020-04-30 ENCOUNTER — Ambulatory Visit: Payer: PPO

## 2020-04-30 ENCOUNTER — Ambulatory Visit (HOSPITAL_COMMUNITY)
Admission: RE | Admit: 2020-04-30 | Discharge: 2020-04-30 | Disposition: A | Discharge status: Home / Self Care | Payer: PPO | Source: Ambulatory Visit | Attending: Pulmonary Disease | Admitting: Pulmonary Disease

## 2020-04-30 ENCOUNTER — Other Ambulatory Visit: Payer: Self-pay | Admitting: Physician Assistant

## 2020-04-30 DIAGNOSIS — I10 Essential (primary) hypertension: Secondary | ICD-10-CM | POA: Insufficient documentation

## 2020-04-30 DIAGNOSIS — U071 COVID-19: Secondary | ICD-10-CM | POA: Insufficient documentation

## 2020-04-30 MED ORDER — SOTROVIMAB 500 MG/8ML IV SOLN
500.0000 mg | Freq: Once | INTRAVENOUS | Status: AC
  Administered 2020-04-30: 500 mg via INTRAVENOUS

## 2020-04-30 MED ORDER — ALBUTEROL SULFATE HFA 108 (90 BASE) MCG/ACT IN AERS: 2.0000 | INHALATION_SPRAY | Freq: Once | RESPIRATORY_TRACT | Status: DC | PRN

## 2020-04-30 MED ORDER — SODIUM CHLORIDE 0.9 % IV SOLN: INTRAVENOUS | Status: DC | PRN

## 2020-04-30 MED ORDER — EPINEPHRINE 0.3 MG/0.3ML IJ SOAJ: 0.3000 mg | Freq: Once | INTRAMUSCULAR | Status: DC | PRN

## 2020-04-30 MED ORDER — FAMOTIDINE IN NACL 20-0.9 MG/50ML-% IV SOLN: 20.0000 mg | Freq: Once | INTRAVENOUS | Status: DC | PRN

## 2020-04-30 MED ORDER — METHYLPREDNISOLONE SODIUM SUCC 125 MG IJ SOLR: 125.0000 mg | Freq: Once | INTRAMUSCULAR | Status: DC | PRN

## 2020-04-30 MED ORDER — DIPHENHYDRAMINE HCL 50 MG/ML IJ SOLN: 50.0000 mg | Freq: Once | INTRAMUSCULAR | Status: DC | PRN

## 2020-04-30 NOTE — Telephone Encounter (Signed)
Please call pt back.

## 2020-04-30 NOTE — Telephone Encounter (Signed)
Attempted to call pt to do a referral for covid  clinic.

## 2020-04-30 NOTE — Progress Notes (Signed)
I connected by phone with Alexis Olson on 04/30/2020 at 10:52 AM to discuss the potential use of a new treatment for mild to moderate COVID-19 viral infection in non-hospitalized patients.  This patient is a 87 y.o. female that meets the FDA criteria for Emergency Use Authorization of COVID monoclonal antibody sotrovimab.  Has a (+) direct SARS-CoV-2 viral test result  Has mild or moderate COVID-19   Is NOT hospitalized due to COVID-19  Is within 10 days of symptom onset  Has at least one of the high risk factor(s) for progression to severe COVID-19 and/or hospitalization as defined in EUA.  Specific high risk criteria : Older age (>/= 85 yo), BMI>25, HTN   I have spoken and communicated the following to the patient or parent/caregiver regarding COVID monoclonal antibody treatment:  1. FDA has authorized the emergency use for the treatment of mild to moderate COVID-19 in adults and pediatric patients with positive results of direct SARS-CoV-2 viral testing who are 55 years of age and older weighing at least 40 kg, and who are at high risk for progressing to severe COVID-19 and/or hospitalization.  2. The significant known and potential risks and benefits of COVID monoclonal antibody, and the extent to which such potential risks and benefits are unknown.  3. Information on available alternative treatments and the risks and benefits of those alternatives, including clinical trials.  4. Patients treated with COVID monoclonal antibody should continue to self-isolate and use infection control measures (e.g., wear mask, isolate, social distance, avoid sharing personal items, clean and disinfect "high touch" surfaces, and frequent handwashing) according to CDC guidelines.   5. The patient or parent/caregiver has the option to accept or refuse COVID monoclonal antibody treatment.  After reviewing this information with the patient, the patient has agreed to receive one of the available covid 19  monoclonal antibodies and will be provided an appropriate fact sheet prior to infusion. Alexis Form, PA-C 04/30/2020 10:52 AM

## 2020-04-30 NOTE — Progress Notes (Signed)
Patient reviewed Fact Sheet for Patients, Parents, and Caregivers for Emergency Use Authorization (EUA) of sotrovimab for the Treatment of Coronavirus. Patient also reviewed and is agreeable to the estimated cost of treatment. Patient is agreeable to proceed.   

## 2020-04-30 NOTE — Progress Notes (Signed)
Diagnosis: COVID-19  Physician: Dr. Patrick Wright  Procedure: Covid Infusion Clinic Med: Sotrovimab infusion - Provided patient with sotrovimab fact sheet for patients, parents, and caregivers prior to infusion.   Complications: No immediate complications noted  Discharge: Discharged home    

## 2020-04-30 NOTE — Telephone Encounter (Signed)
Patient set up for infusion already for today.  Patient is on her way now.

## 2020-04-30 NOTE — Telephone Encounter (Signed)
Spoke with patient earlier and advised her that she was scheduled today and she was actually on her way to infusion clinic.

## 2020-04-30 NOTE — Discharge Instructions (Signed)

## 2020-05-04 DIAGNOSIS — Z20828 Contact with and (suspected) exposure to other viral communicable diseases: Secondary | ICD-10-CM | POA: Diagnosis not present

## 2020-05-05 ENCOUNTER — Other Ambulatory Visit (HOSPITAL_COMMUNITY): Payer: Self-pay | Admitting: Allergy

## 2020-05-05 MED FILL — LEVOCETIRIZINE 5 MG TABLET: 5 | 90 days supply | Qty: 90 | Fill #0

## 2020-05-06 ENCOUNTER — Other Ambulatory Visit: Payer: Self-pay | Admitting: Family Medicine

## 2020-05-07 DIAGNOSIS — M13862 Other specified arthritis, left knee: Secondary | ICD-10-CM | POA: Diagnosis not present

## 2020-05-07 MED FILL — BENZONATATE 100 MG CAPS: 100 | 10 days supply | Qty: 30 | Fill #0

## 2020-05-11 DIAGNOSIS — M1712 Unilateral primary osteoarthritis, left knee: Secondary | ICD-10-CM | POA: Diagnosis not present

## 2020-05-14 ENCOUNTER — Telehealth: Payer: Self-pay | Admitting: Family Medicine

## 2020-05-14 NOTE — Telephone Encounter (Signed)
Patient daughter would like to speak to someone in regard to mother UTI

## 2020-05-14 NOTE — Telephone Encounter (Signed)
Patient daughter states that mother has another UTI,  with dysuria.  She has an appointment with urology on 2/25.  I advised her that I will not probably hear back today.  Please send to [facility].  She advised her mom that she should not have waited this late to tell her that she has this going on.    If you are willing to call her in something can you please call her daughter Scarlet [phone].  If you do not call I advised to call pharmacy tomorrow.

## 2020-05-16 ENCOUNTER — Other Ambulatory Visit: Payer: Self-pay | Admitting: Family Medicine

## 2020-05-16 MED ORDER — CEFDINIR 300 MG PO CAPS: 300.0000 mg | ORAL_CAPSULE | Freq: Two times a day (BID) | ORAL | 0 refills | Status: AC

## 2020-05-16 NOTE — Telephone Encounter (Signed)
I sent in Cefdinir to take twice daily on 05/16/20

## 2020-05-17 NOTE — Telephone Encounter (Signed)
Pt is aware of medication being sent in.

## 2020-05-18 DIAGNOSIS — M13862 Other specified arthritis, left knee: Secondary | ICD-10-CM | POA: Diagnosis not present

## 2020-05-20 DIAGNOSIS — M13862 Other specified arthritis, left knee: Secondary | ICD-10-CM | POA: Diagnosis not present

## 2020-05-24 ENCOUNTER — Other Ambulatory Visit: Payer: Self-pay | Admitting: Family Medicine

## 2020-05-24 DIAGNOSIS — N3021 Other chronic cystitis with hematuria: Secondary | ICD-10-CM | POA: Diagnosis not present

## 2020-05-24 DIAGNOSIS — R3121 Asymptomatic microscopic hematuria: Secondary | ICD-10-CM | POA: Diagnosis not present

## 2020-05-24 MED FILL — ELIQUIS 5 MG TABLET: 5 | 30 days supply | Qty: 60 | Fill #0

## 2020-05-24 MED FILL — GABAPENTIN 300 MG CAPSULE: 300 | 90 days supply | Qty: 90 | Fill #1

## 2020-05-25 DIAGNOSIS — M13862 Other specified arthritis, left knee: Secondary | ICD-10-CM | POA: Diagnosis not present

## 2020-05-26 MED FILL — CYANOCOBALAMIN 1,000 MCG/ML: 1000 | 84 days supply | Qty: 3 | Fill #2

## 2020-05-27 DIAGNOSIS — M1712 Unilateral primary osteoarthritis, left knee: Secondary | ICD-10-CM | POA: Diagnosis not present

## 2020-05-28 MED FILL — MONUROL 3 GM SACHET: 3 | 6 days supply | Qty: 3 | Fill #0

## 2020-06-01 DIAGNOSIS — M13862 Other specified arthritis, left knee: Secondary | ICD-10-CM | POA: Diagnosis not present

## 2020-06-03 ENCOUNTER — Other Ambulatory Visit: Payer: Self-pay | Admitting: Internal Medicine

## 2020-06-03 DIAGNOSIS — M1712 Unilateral primary osteoarthritis, left knee: Secondary | ICD-10-CM | POA: Diagnosis not present

## 2020-06-03 MED FILL — HEMOCYTE PLUS CAPSULE: 106-1 | 30 days supply | Qty: 60 | Fill #0

## 2020-06-07 DIAGNOSIS — R3121 Asymptomatic microscopic hematuria: Secondary | ICD-10-CM | POA: Diagnosis not present

## 2020-06-07 DIAGNOSIS — N3021 Other chronic cystitis with hematuria: Secondary | ICD-10-CM | POA: Diagnosis not present

## 2020-06-09 DIAGNOSIS — M13862 Other specified arthritis, left knee: Secondary | ICD-10-CM | POA: Diagnosis not present

## 2020-06-11 DIAGNOSIS — M1712 Unilateral primary osteoarthritis, left knee: Secondary | ICD-10-CM | POA: Diagnosis not present

## 2020-06-14 DIAGNOSIS — M13862 Other specified arthritis, left knee: Secondary | ICD-10-CM | POA: Diagnosis not present

## 2020-06-15 ENCOUNTER — Other Ambulatory Visit: Payer: Self-pay | Admitting: Family Medicine

## 2020-06-15 MED FILL — METHOCARBAMOL 500 MG TABS: 500 | 30 days supply | Qty: 90 | Fill #0

## 2020-06-18 ENCOUNTER — Other Ambulatory Visit (HOSPITAL_BASED_OUTPATIENT_CLINIC_OR_DEPARTMENT_OTHER): Payer: Self-pay

## 2020-06-18 DIAGNOSIS — M1712 Unilateral primary osteoarthritis, left knee: Secondary | ICD-10-CM | POA: Diagnosis not present

## 2020-06-22 DIAGNOSIS — M13862 Other specified arthritis, left knee: Secondary | ICD-10-CM | POA: Diagnosis not present

## 2020-06-24 ENCOUNTER — Other Ambulatory Visit: Payer: Self-pay | Admitting: Internal Medicine

## 2020-06-24 DIAGNOSIS — M13862 Other specified arthritis, left knee: Secondary | ICD-10-CM | POA: Diagnosis not present

## 2020-06-25 ENCOUNTER — Other Ambulatory Visit: Payer: Self-pay | Admitting: Internal Medicine

## 2020-06-25 MED FILL — DIPHENOXYLATE-ATROPINE 2.5-: 2.5-0.025 | 25 days supply | Qty: 150 | Fill #0

## 2020-06-25 NOTE — Telephone Encounter (Signed)
How many refills Sir, thank you. 

## 2020-06-26 ENCOUNTER — Other Ambulatory Visit: Payer: Self-pay | Admitting: Internal Medicine

## 2020-06-26 MED FILL — Apixaban Tab 5 MG: ORAL | 30 days supply | Qty: 60 | Fill #0 | Status: AC

## 2020-06-26 MED FILL — Amitriptyline HCl Tab 100 MG: ORAL | 90 days supply | Qty: 90 | Fill #0 | Status: AC

## 2020-06-27 ENCOUNTER — Other Ambulatory Visit (HOSPITAL_COMMUNITY): Payer: Self-pay

## 2020-06-28 ENCOUNTER — Other Ambulatory Visit (HOSPITAL_COMMUNITY): Payer: Self-pay

## 2020-06-28 MED ORDER — OMEPRAZOLE 20 MG PO CPDR
DELAYED_RELEASE_CAPSULE | Freq: Every day | ORAL | 0 refills | Status: DC
  Filled 2020-06-28: qty 90, 90d supply, fill #0

## 2020-06-29 ENCOUNTER — Other Ambulatory Visit (HOSPITAL_COMMUNITY): Payer: Self-pay

## 2020-07-01 ENCOUNTER — Other Ambulatory Visit (HOSPITAL_COMMUNITY): Payer: Self-pay

## 2020-07-01 DIAGNOSIS — R300 Dysuria: Secondary | ICD-10-CM | POA: Diagnosis not present

## 2020-07-01 DIAGNOSIS — N3000 Acute cystitis without hematuria: Secondary | ICD-10-CM | POA: Diagnosis not present

## 2020-07-01 DIAGNOSIS — R350 Frequency of micturition: Secondary | ICD-10-CM | POA: Diagnosis not present

## 2020-07-02 ENCOUNTER — Other Ambulatory Visit (HOSPITAL_COMMUNITY): Payer: Self-pay

## 2020-07-02 MED ORDER — NITROFURANTOIN MACROCRYSTAL 100 MG PO CAPS
ORAL_CAPSULE | ORAL | 6 refills | Status: DC
  Filled 2020-07-02: qty 30, 30d supply, fill #0

## 2020-07-02 MED ORDER — AMOXICILLIN-POT CLAVULANATE 875-125 MG PO TABS
ORAL_TABLET | ORAL | 0 refills | Status: DC
  Filled 2020-07-02: qty 20, 10d supply, fill #0

## 2020-07-03 ENCOUNTER — Other Ambulatory Visit (HOSPITAL_COMMUNITY): Payer: Self-pay

## 2020-07-03 MED FILL — Potassium Chloride Microencapsulated Crys ER Tab 20 mEq: ORAL | 90 days supply | Qty: 90 | Fill #0 | Status: AC

## 2020-07-05 ENCOUNTER — Encounter (HOSPITAL_COMMUNITY): Payer: Self-pay | Admitting: Family Medicine

## 2020-07-05 ENCOUNTER — Inpatient Hospital Stay (HOSPITAL_COMMUNITY)
Admission: EM | Admit: 2020-07-05 | Discharge: 2020-07-10 | DRG: 554 | Disposition: A | Discharge status: Home Health | Payer: PPO | Attending: Internal Medicine | Admitting: Internal Medicine

## 2020-07-05 ENCOUNTER — Other Ambulatory Visit: Payer: Self-pay

## 2020-07-05 ENCOUNTER — Telehealth: Payer: Self-pay | Admitting: Family Medicine

## 2020-07-05 ENCOUNTER — Emergency Department (HOSPITAL_COMMUNITY): Payer: PPO

## 2020-07-05 DIAGNOSIS — M00262 Other streptococcal arthritis, left knee: Secondary | ICD-10-CM | POA: Diagnosis not present

## 2020-07-05 DIAGNOSIS — E871 Hypo-osmolality and hyponatremia: Secondary | ICD-10-CM | POA: Diagnosis not present

## 2020-07-05 DIAGNOSIS — M109 Gout, unspecified: Secondary | ICD-10-CM | POA: Diagnosis not present

## 2020-07-05 DIAGNOSIS — Z7901 Long term (current) use of anticoagulants: Secondary | ICD-10-CM

## 2020-07-05 DIAGNOSIS — R197 Diarrhea, unspecified: Secondary | ICD-10-CM | POA: Diagnosis present

## 2020-07-05 DIAGNOSIS — Z881 Allergy status to other antibiotic agents status: Secondary | ICD-10-CM

## 2020-07-05 DIAGNOSIS — E876 Hypokalemia: Secondary | ICD-10-CM | POA: Diagnosis not present

## 2020-07-05 DIAGNOSIS — Z91048 Other nonmedicinal substance allergy status: Secondary | ICD-10-CM | POA: Diagnosis not present

## 2020-07-05 DIAGNOSIS — A419 Sepsis, unspecified organism: Secondary | ICD-10-CM | POA: Insufficient documentation

## 2020-07-05 DIAGNOSIS — I447 Left bundle-branch block, unspecified: Secondary | ICD-10-CM | POA: Diagnosis present

## 2020-07-05 DIAGNOSIS — M1712 Unilateral primary osteoarthritis, left knee: Principal | ICD-10-CM | POA: Diagnosis present

## 2020-07-05 DIAGNOSIS — E860 Dehydration: Secondary | ICD-10-CM | POA: Diagnosis not present

## 2020-07-05 DIAGNOSIS — J45909 Unspecified asthma, uncomplicated: Secondary | ICD-10-CM | POA: Diagnosis present

## 2020-07-05 DIAGNOSIS — Z9884 Bariatric surgery status: Secondary | ICD-10-CM | POA: Diagnosis not present

## 2020-07-05 DIAGNOSIS — Z96651 Presence of right artificial knee joint: Secondary | ICD-10-CM | POA: Diagnosis present

## 2020-07-05 DIAGNOSIS — M1120 Other chondrocalcinosis, unspecified site: Secondary | ICD-10-CM | POA: Diagnosis not present

## 2020-07-05 DIAGNOSIS — M79605 Pain in left leg: Secondary | ICD-10-CM | POA: Diagnosis not present

## 2020-07-05 DIAGNOSIS — F411 Generalized anxiety disorder: Secondary | ICD-10-CM | POA: Diagnosis present

## 2020-07-05 DIAGNOSIS — M009 Pyogenic arthritis, unspecified: Secondary | ICD-10-CM | POA: Diagnosis present

## 2020-07-05 DIAGNOSIS — M25462 Effusion, left knee: Secondary | ICD-10-CM | POA: Diagnosis not present

## 2020-07-05 DIAGNOSIS — M2548 Effusion, other site: Secondary | ICD-10-CM | POA: Diagnosis not present

## 2020-07-05 DIAGNOSIS — I82409 Acute embolism and thrombosis of unspecified deep veins of unspecified lower extremity: Secondary | ICD-10-CM | POA: Diagnosis present

## 2020-07-05 DIAGNOSIS — M810 Age-related osteoporosis without current pathological fracture: Secondary | ICD-10-CM | POA: Diagnosis not present

## 2020-07-05 DIAGNOSIS — D649 Anemia, unspecified: Secondary | ICD-10-CM | POA: Diagnosis present

## 2020-07-05 DIAGNOSIS — G4700 Insomnia, unspecified: Secondary | ICD-10-CM | POA: Diagnosis not present

## 2020-07-05 DIAGNOSIS — R011 Cardiac murmur, unspecified: Secondary | ICD-10-CM | POA: Diagnosis not present

## 2020-07-05 DIAGNOSIS — I10 Essential (primary) hypertension: Secondary | ICD-10-CM | POA: Diagnosis present

## 2020-07-05 DIAGNOSIS — R5381 Other malaise: Secondary | ICD-10-CM | POA: Diagnosis present

## 2020-07-05 DIAGNOSIS — Z888 Allergy status to other drugs, medicaments and biological substances status: Secondary | ICD-10-CM

## 2020-07-05 DIAGNOSIS — I82402 Acute embolism and thrombosis of unspecified deep veins of left lower extremity: Secondary | ICD-10-CM | POA: Diagnosis not present

## 2020-07-05 DIAGNOSIS — G894 Chronic pain syndrome: Secondary | ICD-10-CM | POA: Diagnosis not present

## 2020-07-05 DIAGNOSIS — Z882 Allergy status to sulfonamides status: Secondary | ICD-10-CM

## 2020-07-05 DIAGNOSIS — K219 Gastro-esophageal reflux disease without esophagitis: Secondary | ICD-10-CM | POA: Diagnosis present

## 2020-07-05 DIAGNOSIS — Z86718 Personal history of other venous thrombosis and embolism: Secondary | ICD-10-CM | POA: Diagnosis not present

## 2020-07-05 DIAGNOSIS — Z79899 Other long term (current) drug therapy: Secondary | ICD-10-CM

## 2020-07-05 DIAGNOSIS — M11262 Other chondrocalcinosis, left knee: Secondary | ICD-10-CM | POA: Diagnosis present

## 2020-07-05 DIAGNOSIS — Z885 Allergy status to narcotic agent status: Secondary | ICD-10-CM

## 2020-07-05 DIAGNOSIS — M25562 Pain in left knee: Secondary | ICD-10-CM

## 2020-07-05 DIAGNOSIS — Z7951 Long term (current) use of inhaled steroids: Secondary | ICD-10-CM

## 2020-07-05 DIAGNOSIS — E785 Hyperlipidemia, unspecified: Secondary | ICD-10-CM | POA: Diagnosis not present

## 2020-07-05 DIAGNOSIS — E559 Vitamin D deficiency, unspecified: Secondary | ICD-10-CM | POA: Diagnosis not present

## 2020-07-05 DIAGNOSIS — R0902 Hypoxemia: Secondary | ICD-10-CM | POA: Diagnosis not present

## 2020-07-05 DIAGNOSIS — Z20822 Contact with and (suspected) exposure to covid-19: Secondary | ICD-10-CM | POA: Diagnosis present

## 2020-07-05 DIAGNOSIS — I959 Hypotension, unspecified: Secondary | ICD-10-CM | POA: Diagnosis present

## 2020-07-05 LAB — CBC
HCT: 38.1 % (ref 36.0–46.0)
Hemoglobin: 12.1 g/dL (ref 12.0–15.0)
MCH: 31.2 pg (ref 26.0–34.0)
MCHC: 31.8 g/dL (ref 30.0–36.0)
MCV: 98.2 fL (ref 80.0–100.0)
Platelets: 241 10*3/uL (ref 150–400)
RBC: 3.88 MIL/uL (ref 3.87–5.11)
RDW: 14.4 % (ref 11.5–15.5)
WBC: 14.7 10*3/uL — ABNORMAL HIGH (ref 4.0–10.5)
nRBC: 0 % (ref 0.0–0.2)

## 2020-07-05 LAB — COMPREHENSIVE METABOLIC PANEL
ALT: 20 U/L (ref 0–44)
AST: 19 U/L (ref 15–41)
Albumin: 3.4 g/dL — ABNORMAL LOW (ref 3.5–5.0)
Alkaline Phosphatase: 57 U/L (ref 38–126)
Anion gap: 7 (ref 5–15)
BUN: 11 mg/dL (ref 8–23)
CO2: 25 mmol/L (ref 22–32)
Calcium: 9.1 mg/dL (ref 8.9–10.3)
Chloride: 100 mmol/L (ref 98–111)
Creatinine, Ser: 0.63 mg/dL (ref 0.44–1.00)
GFR, Estimated: >60 mL/min (ref >60)
Glucose, Bld: 106 mg/dL — ABNORMAL HIGH (ref 70–99)
Potassium: 3.4 mmol/L — ABNORMAL LOW (ref 3.5–5.1)
Sodium: 132 mmol/L — ABNORMAL LOW (ref 135–145)
Total Bilirubin: 0.6 mg/dL (ref 0.3–1.2)
Total Protein: 7.2 g/dL (ref 6.5–8.1)

## 2020-07-05 LAB — SYNOVIAL CELL COUNT + DIFF, W/ CRYSTALS
Eosinophils-Synovial: 0 % (ref 0–1)
Lymphocytes-Synovial Fld: 1 % (ref 0–20)
Monocyte-Macrophage-Synovial Fluid: 6 % — ABNORMAL LOW (ref 50–90)
Neutrophil, Synovial: 93 % — ABNORMAL HIGH (ref 0–25)
WBC, Synovial: 49000 /mm3 — ABNORMAL HIGH (ref 0–200)

## 2020-07-05 LAB — LACTIC ACID, PLASMA: Lactic Acid, Venous: 1.1 mmol/L (ref 0.5–1.9)

## 2020-07-05 LAB — SEDIMENTATION RATE: Sed Rate: 45 mm/hr — ABNORMAL HIGH (ref 0–22)

## 2020-07-05 LAB — C-REACTIVE PROTEIN: CRP: 19.2 mg/dL — ABNORMAL HIGH (ref <1.0)

## 2020-07-05 MED ORDER — ONDANSETRON HCL 4 MG/2ML IJ SOLN: 4.0000 mg | Freq: Four times a day (QID) | INTRAMUSCULAR | Status: DC | PRN

## 2020-07-05 MED ORDER — SODIUM CHLORIDE 0.9 % IV BOLUS
500.0000 mL | Freq: Once | INTRAVENOUS | Status: AC
  Administered 2020-07-06: 500 mL via INTRAVENOUS

## 2020-07-05 MED ORDER — ZOLPIDEM TARTRATE 5 MG PO TABS
5.0000 mg | ORAL_TABLET | Freq: Every evening | ORAL | Status: DC | PRN
  Administered 2020-07-06 – 2020-07-09 (×5): 5 mg via ORAL
  Filled 2020-07-05 (×5): qty 1

## 2020-07-05 MED ORDER — ACETAMINOPHEN 650 MG RE SUPP: 650.0000 mg | Freq: Four times a day (QID) | RECTAL | Status: DC | PRN

## 2020-07-05 MED ORDER — ACETAMINOPHEN 325 MG PO TABS
650.0000 mg | ORAL_TABLET | Freq: Four times a day (QID) | ORAL | Status: DC | PRN
Start: 2020-07-05 — End: 2020-07-05

## 2020-07-05 MED ORDER — OXYCODONE HCL 5 MG PO TABS
5.0000 mg | ORAL_TABLET | Freq: Four times a day (QID) | ORAL | Status: DC | PRN
Start: 2020-07-05 — End: 2020-07-10
  Administered 2020-07-06 – 2020-07-10 (×9): 5 mg via ORAL
  Filled 2020-07-05 (×9): qty 1

## 2020-07-05 MED ORDER — POTASSIUM CHLORIDE IN NACL 20-0.9 MEQ/L-% IV SOLN
INTRAVENOUS | Status: AC
Start: 2020-07-05 — End: 2020-07-06
  Filled 2020-07-05: qty 1000

## 2020-07-05 MED ORDER — POTASSIUM CHLORIDE CRYS ER 20 MEQ PO TBCR
20.0000 meq | EXTENDED_RELEASE_TABLET | Freq: Every day | ORAL | Status: DC
Start: 2020-07-06 — End: 2020-07-10
  Administered 2020-07-06 – 2020-07-10 (×3): 20 meq via ORAL
  Filled 2020-07-05 (×4): qty 1

## 2020-07-05 MED ORDER — ALPRAZOLAM 0.25 MG PO TABS: 0.5000 mg | ORAL_TABLET | Freq: Every evening | ORAL | Status: DC | PRN

## 2020-07-05 MED ORDER — RISAQUAD PO CAPS
1.0000 | ORAL_CAPSULE | Freq: Every day | ORAL | Status: DC
  Administered 2020-07-06 – 2020-07-10 (×5): 1 via ORAL
  Filled 2020-07-05 (×5): qty 1

## 2020-07-05 MED ORDER — SODIUM CHLORIDE 0.9 % IV SOLN
1.0000 g | Freq: Once | INTRAVENOUS | Status: AC
  Administered 2020-07-05: 1 g via INTRAVENOUS
  Filled 2020-07-05: qty 10

## 2020-07-05 MED ORDER — MORPHINE SULFATE (PF) 2 MG/ML IV SOLN: 2.0000 mg | INTRAVENOUS | Status: DC | PRN

## 2020-07-05 MED ORDER — LIDOCAINE HCL (PF) 1 % IJ SOLN
30.0000 mL | Freq: Once | INTRAMUSCULAR | Status: AC
  Administered 2020-07-05: 30 mL via INTRADERMAL
  Filled 2020-07-05: qty 30

## 2020-07-05 MED ORDER — METHOCARBAMOL 500 MG PO TABS: 500.0000 mg | ORAL_TABLET | Freq: Three times a day (TID) | ORAL | Status: DC | PRN

## 2020-07-05 MED ORDER — OXYCODONE HCL 5 MG PO TABS: 5.0000 mg | ORAL_TABLET | Freq: Four times a day (QID) | ORAL | Status: DC | PRN

## 2020-07-05 MED ORDER — ALBUTEROL SULFATE HFA 108 (90 BASE) MCG/ACT IN AERS
1.0000 | INHALATION_SPRAY | Freq: Four times a day (QID) | RESPIRATORY_TRACT | Status: DC | PRN
  Filled 2020-07-05: qty 6.7

## 2020-07-05 MED ORDER — AMITRIPTYLINE HCL 50 MG PO TABS
100.0000 mg | ORAL_TABLET | Freq: Every day | ORAL | Status: DC
  Administered 2020-07-06 – 2020-07-09 (×5): 100 mg via ORAL
  Filled 2020-07-05 (×5): qty 2

## 2020-07-05 MED ORDER — DICYCLOMINE HCL 10 MG PO CAPS
10.0000 mg | ORAL_CAPSULE | Freq: Three times a day (TID) | ORAL | Status: DC | PRN
  Filled 2020-07-05: qty 1

## 2020-07-05 MED ORDER — HYDROCODONE-ACETAMINOPHEN 5-325 MG PO TABS: 1.0000 | ORAL_TABLET | Freq: Four times a day (QID) | ORAL | Status: DC | PRN

## 2020-07-05 MED ORDER — SENNOSIDES-DOCUSATE SODIUM 8.6-50 MG PO TABS: 1.0000 | ORAL_TABLET | Freq: Every evening | ORAL | Status: DC | PRN

## 2020-07-05 MED ORDER — ACETAMINOPHEN 325 MG PO TABS
650.0000 mg | ORAL_TABLET | Freq: Four times a day (QID) | ORAL | Status: DC | PRN
  Administered 2020-07-06 (×2): 650 mg via ORAL
  Filled 2020-07-05 (×2): qty 2

## 2020-07-05 MED ORDER — TAMSULOSIN HCL 0.4 MG PO CAPS
0.4000 mg | ORAL_CAPSULE | Freq: Every day | ORAL | Status: DC
  Administered 2020-07-06 – 2020-07-07 (×2): 0.4 mg via ORAL
  Filled 2020-07-05 (×2): qty 1

## 2020-07-05 MED ORDER — PANTOPRAZOLE SODIUM 40 MG PO TBEC
40.0000 mg | DELAYED_RELEASE_TABLET | Freq: Every day | ORAL | Status: DC
  Administered 2020-07-06 – 2020-07-10 (×5): 40 mg via ORAL
  Filled 2020-07-05 (×5): qty 1

## 2020-07-05 MED ORDER — MONTELUKAST SODIUM 10 MG PO TABS: 10.0000 mg | ORAL_TABLET | Freq: Every day | ORAL | Status: DC | PRN

## 2020-07-05 MED ORDER — GABAPENTIN 300 MG PO CAPS
300.0000 mg | ORAL_CAPSULE | Freq: Every day | ORAL | Status: DC
  Administered 2020-07-06 – 2020-07-09 (×5): 300 mg via ORAL
  Filled 2020-07-05 (×5): qty 1

## 2020-07-05 MED ORDER — VANCOMYCIN HCL 1500 MG/300ML IV SOLN
1500.0000 mg | Freq: Once | INTRAVENOUS | Status: AC
  Administered 2020-07-05: 1500 mg via INTRAVENOUS
  Filled 2020-07-05: qty 300

## 2020-07-05 MED ORDER — MOMETASONE FURO-FORMOTEROL FUM 200-5 MCG/ACT IN AERO
2.0000 | INHALATION_SPRAY | Freq: Two times a day (BID) | RESPIRATORY_TRACT | Status: DC
  Administered 2020-07-06 – 2020-07-10 (×9): 2 via RESPIRATORY_TRACT
  Filled 2020-07-05: qty 8.8

## 2020-07-05 NOTE — ED Notes (Signed)
Patient returned from X-ray 

## 2020-07-05 NOTE — ED Notes (Signed)
Time out performed, left knee aspiration. MD Countryman at bedside.

## 2020-07-05 NOTE — Telephone Encounter (Signed)
Spoke with patient.  She states that her left knee is hurting real bad.  She has been doing PT and has been going well.  It was ordered by [facility].  I advised her, since this has been an issue before, to call the orthopedic and let them know how she is feeling. I also advised that you were out of the office today and to make sure she calls them.

## 2020-07-05 NOTE — H&P (Signed)
History and Physical    Alexis Olson RWE:[id number] DOB: 18-Sep-1933 DOA: 07/05/2020  PCP: Mosie Lukes, MD   Patient coming from: Home   Chief Complaint: Left knee pain and swelling   HPI: Alexis Olson is a 87 y.o. female with medical history significant for chronic pain, anxiety, insomnia, asthma, and history of provoked left leg DVT on Eliquis, now presenting to the emergency department for evaluation of left knee pain and swelling.  Patient reports chronic arthritis with pain but has developed significant worsening in the left knee pain and swelling since 07/01/2020.  Pain and swelling now progressively worsened and today she was unable to bear weight due to pain.  She has also been experiencing subjective fevers and chills and general malaise over the past couple days.  She denies any chest pain, cough, or wheezing.  She took a dose of Eliquis this morning but not yet this evening.  ED Course: Upon arrival to the ED, patient is found to be febrile to 38 C, saturating mid 90s on room air, tachycardic in the 110s, and with blood pressure 106/65.  Chemistry panel notable for sodium 132 and potassium 3.4.  CBC features a leukocytosis to 14,700.  Lactic acid reassuringly normal.  Left knee was aspirated in the ED and fluid was sent for Gram stain and culture.  Blood cultures were also ordered as well as empiric antibiotics, orthopedic surgery was consulted by the ED physician, and hospitalist asked to admit.  Review of Systems:  All other systems reviewed and apart from HPI, are negative.  Past Medical History:  Diagnosis Date  . ALLERGIC RHINITIS   . ANEMIA, CHRONIC    Malabsorption related to bypass hx, B12 and iron deficiency  . ANXIETY   . Arthritis   . ASTHMA   . Bariatric surgery status   . Bowel perforation ([place]) 2016  . C. difficile diarrhea   . Colon perforation ([place]) 08/2014   following polypetomy during colo, surgical repair  . COLONIC POLYPS, ADENOMATOUS, HX OF 2010  .  DIVERTICULOSIS, COLON   . DVT (deep venous thrombosis) ([place]) 2018  . DVT (deep venous thrombosis) ([place])   . GERD (gastroesophageal reflux disease) 08/23/2015  . Graded compression stocking in place   . History of blood transfusion   . History of migraine   . History of rheumatic fever   . HSV (herpes simplex virus) infection 08/16/2015  . Hyperlipidemia   . Hypertension    Denies  . Hyponatremia 05/02/2016  . Incomplete left bundle branch block (LBBB) 2016   Noted on EKG  . INSOMNIA   . Lumbosacral disc disease    Chronic pain; History of osteomyelitis 2010 following ESI complication  . Obesity   . Osteomyelitis ([place])   . Pelvic fracture ([place])   . Pneumonia   . Recurrent UTI 08/23/2015  . URINARY URGENCY   . Ventral hernia without obstruction or gangrene 05/22/2018  . VITAMIN B12 DEFICIENCY     Past Surgical History:  Procedure Laterality Date  . [place]   for ruptured disc  . CHOLECYSTECTOMY    . COLONOSCOPY    . greenfield filter    . ileojejunal bypass  1976   for obesity  . LAPAROTOMY N/A 09/18/2014   Procedure: EXPLORATORY LAPAROTOMY WITH REPAIR OF CECIAL PERFORATION;  Surgeon: Autumn Messing III, MD;  Location: [facility];  Service: General;  Laterality: N/A;  . TONSILLECTOMY    . TOTAL KNEE ARTHROPLASTY Right 08/26/2019  Procedure: RIGHT TOTAL KNEE ARTHROPLASTY, LEFT KNEE CORTISONE INJECTION;  Surgeon: Paralee Cancel, MD;  Location: [facility];  Service: Orthopedics;  Laterality: Right;  70 MINS  . UPPER GI ENDOSCOPY    . VASCULAR SURGERY Right    revascularization right lower extremity    Social History:   reports that she has never smoked. She has never used smokeless tobacco. She reports that she does not drink alcohol and does not use drugs.  Allergies  Allergen Reactions  . Ativan [Lorazepam] Anaphylaxis and Other (See Comments)    Deathly allergic; "resp rate dropped dropped to 6"  . Other Anaphylaxis    Red peppers--choking, also  . Azithromycin Diarrhea  .  Benadryl [Diphenhydramine Hcl] Other (See Comments)    Paradoxical response  . Ciprofloxacin Hcl Other (See Comments)    Causes yeast infection and refuses to take  . Codeine Phosphate Nausea Only    Can tolerate in limited amounts  . Hydrocodone Itching and Nausea And Vomiting  . Hydrocodone-Homatropine Itching and Nausea And Vomiting  . Levaquin [Levofloxacin In D5w] Other (See Comments)    Muscle soreness  . Levofloxacin   . Meperidine Hcl Other (See Comments)    Reaction ??  . Molds & Smuts Other (See Comments)    unsure  . Sulfa Antibiotics Other (See Comments)    Joint pain  . Tape Other (See Comments)    No Coban wrap (per the patient)  . Tramadol   . Ultram [Tramadol Hcl] Other (See Comments)    "Can't move joints"    Family History  Problem Relation Age of Onset  . Cervical cancer Mother   . Stroke Mother   . Liver disease Sister   . Kidney disease Sister   . Cirrhosis Sister   . Colon cancer Neg Hx   . Esophageal cancer Neg Hx   . Rectal cancer Neg Hx   . Stomach cancer Neg Hx      Prior to Admission medications   Medication Sig Start Date End Date Taking? Authorizing Provider  acetaminophen (TYLENOL) 500 MG tablet Take 2 tablets (1,000 mg total) by mouth every 8 (eight) hours. 08/27/19  Yes Maurice March, PA-C  albuterol (PROVENTIL HFA;VENTOLIN HFA) 108 (90 BASE) MCG/ACT inhaler Inhale 1-2 puffs into the lungs every 6 (six) hours as needed for wheezing or shortness of breath.   Yes [provider]  ALPRAZolam Duanne Moron) 1 MG tablet Take 0.5-1 tablets (0.5-1 mg total) by mouth at bedtime as needed. Patient taking differently: Take 0.5-1 mg by mouth at bedtime as needed for anxiety or sleep. 11/20/19  Yes Mosie Lukes, MD  amitriptyline (ELAVIL) 100 MG tablet TAKE 1 TABLET (100 MG TOTAL) BY MOUTH DAILY. Patient taking differently: Take 100 mg by mouth daily. 03/29/20 03/29/21 Yes Mosie Lukes, MD  amoxicillin-clavulanate (AUGMENTIN) 875-125 MG tablet  Take 1 tablet by mouth twice daily 07/02/20  Yes   apixaban (ELIQUIS) 5 MG TABS tablet TAKE 1 TABLET BY MOUTH 2 TIMES DAILY Patient taking differently: Take 5 mg by mouth 2 (two) times daily. 05/24/20 05/24/21 Yes Mosie Lukes, MD  azelastine (OPTIVAR) 0.05 % ophthalmic solution Place 1 drop into both eyes daily as needed (for dry eyes).   Yes [provider]  benzonatate (TESSALON) 100 MG capsule TAKE 1 CAPSULE BY MOUTH 3 TIMES DAILY AS NEEDED FOR COUGH 05/06/20 05/06/21 Yes Mosie Lukes, MD  budesonide-formoterol [facility]) 160-4.5 MCG/ACT inhaler Inhale 2 puffs into the lungs See admin instructions. Inhale  2 puffs into the lungs one to two times a day 03/04/18  Yes Saguier, Percell Miller, PA-C  cetirizine (ZYRTEC) 10 MG tablet Take 10 mg by mouth daily.  02/05/14  Yes [provider]  cyanocobalamin (,VITAMIN B-12,) 1000 MCG/ML injection INJECT 1CC INTO THE MUSCLE EVERY MONTH Patient taking differently: Inject 1,000 mcg into the muscle every 30 (thirty) days. 10/08/19 10/07/20 Yes Gatha Mayer, MD  dicyclomine (BENTYL) 10 MG capsule TAKE 1 CAPSULE BY MOUTH 3 TIMES DAILY AS NEEDED FOR DIARRHEA AND SYMPTOMS Patient taking differently: Take 10 mg by mouth 3 (three) times daily as needed for spasms. 12/08/19  Yes Gatha Mayer, MD  diphenoxylate-atropine (LOMOTIL) 2.5-0.025 MG tablet TAKE 2 TABLETS BY MOUTH 3 TIMES DAILY AS NEEDED FOR DIARRHEA Patient taking differently: Take 1 tablet by mouth 3 (three) times daily as needed for diarrhea or loose stools. 06/25/20 12/22/20 Yes Gatha Mayer, MD  docusate sodium (COLACE) 100 MG capsule Take 1 capsule (100 mg total) by mouth 2 (two) times daily. Patient taking differently: Take 100 mg by mouth daily as needed for mild constipation. 08/27/19  Yes Griffith Citron R, PA-C  Fe Fum-FA-B Cmp-C-Zn-Mg-Mn-Cu (HEMOCYTE PLUS) 106-1 MG CAPS TAKE 1 CAPSULE BY MOUTH 2 TIMES DAILY 06/03/20 06/03/21 Yes Gatha Mayer, MD  ferrous sulfate 325 (65 FE) MG tablet  Take 1 tablet (325 mg total) by mouth 2 (two) times daily with a meal. 11/16/19  Yes Allie Bossier, MD  fluticasone [facility]) 50 MCG/ACT nasal spray Place 2 sprays into the nose daily.   Yes [provider]  gabapentin (NEURONTIN) 300 MG capsule TAKE 1 CAPSULE BY MOUTH AT BEDTIME 02/21/20 02/20/21 Yes Mosie Lukes, MD  hydrOXYzine (ATARAX) 25 MG tablet Take 25 mg by mouth 3 (three) times daily as needed for itching.   Yes [provider]  levocetirizine (XYZAL) 5 MG tablet Take 1 tablet (5 mg total) by mouth every evening. 08/04/14  Yes Rowe Clack, MD  lubiprostone (AMITIZA) 24 MCG capsule Take 24 mcg by mouth daily as needed for constipation.   Yes [provider]  magnesium oxide (MAG-OX) 400 MG tablet Take 400 mg by mouth 2 (two) times daily.   Yes [provider]  methocarbamol (ROBAXIN) 500 MG tablet TAKE 1 TABLET BY MOUTH EVERY 8 HOURS AS NEEDED FOR MUSCLE SPASMS 06/15/20 06/15/21 Yes Mosie Lukes, MD  montelukast (SINGULAIR) 10 MG tablet Take 10 mg by mouth daily as needed (For allergy).  02/11/14  Yes [provider]  nitrofurantoin (MACRODANTIN) 100 MG capsule Take 1 capsule by mouth at bedtime.  Begin after finishing Augmentin Patient taking differently: Take 100 mg by mouth at bedtime. 07/02/20  Yes   omeprazole (PRILOSEC) 20 MG capsule TAKE 1 CAPSULE BY MOUTH ONCE DAILY Patient taking differently: Take 20 mg by mouth daily. 06/28/20 06/28/21 Yes Gatha Mayer, MD  ondansetron (ZOFRAN-ODT) 4 MG disintegrating tablet Take 1 tablet (4 mg total) by mouth 3 (three) times daily as needed for nausea or vomiting. 08/27/19  Yes Griffith Citron R, PA-C  oxyCODONE (OXY IR/ROXICODONE) 5 MG immediate release tablet Take 1 tablet (5 mg total) by mouth every 6 (six) hours as needed for severe pain. 12/14/19  Yes Wieters, Hallie C, PA-C  potassium chloride SA (KLOR-CON) 20 MEQ tablet TAKE 1 TABLET BY MOUTH ONCE A DAY Patient taking differently: Take 20  mEq by mouth daily. 03/24/20 03/24/21 Yes Mosie Lukes, MD  Probiotic Product (PROBIOTIC DAILY PO) Take 1  capsule by mouth daily.   Yes [provider]  tamsulosin (FLOMAX) 0.4 MG CAPS capsule Take 1 capsule (0.4 mg total) by mouth daily after supper. 11/16/19  Yes Allie Bossier, MD  valACYclovir (VALTREX) 500 MG tablet TAKE 1 TABLET BY MOUTH ONCE A DAY AS NEEDED. Patient taking differently: Take 500 mg by mouth daily as needed (For outbreaks). 01/21/20 01/20/21 Yes Mosie Lukes, MD  Vitamin D, Ergocalciferol, (DRISDOL) 1.25 MG (50000 UNIT) CAPS capsule TAKE 1 CAPSULE BY MOUTH EVERY 7 DAYS Patient taking differently: Take 50,000 Units by mouth every 7 (seven) days. 09/25/19 09/24/20 Yes Mosie Lukes, MD  zolpidem (AMBIEN) 5 MG tablet TAKE 1 TABLET BY MOUTH AT BEDTIME Patient taking differently: Take 5 mg by mouth at bedtime. 04/16/20 10/13/20 Yes Mosie Lukes, MD    Physical Exam: Vitals:   07/05/20 1853 07/05/20 1907 07/05/20 2000 07/05/20 2300  BP:  106/65 123/66 126/71  Pulse: (!) 101 (!) 104 (!) 113 (!) 114  Resp: 17 17 (!) 24 16  Temp:      TempSrc:      SpO2: 93% 95% 96% 96%  Weight:   77.1 kg     Constitutional: NAD, calm  Eyes: PERTLA, lids and conjunctivae normal ENMT: Mucous membranes are moist. Posterior pharynx clear of any exudate or lesions.   Neck: normal, supple, no masses, no thyromegaly Respiratory: no wheezing, no crackles. No accessory muscle use.  Cardiovascular: S1 & S2 heard, regular rate and rhythm. No extremity edema.   Abdomen: No distension, no tenderness, soft. Bowel sounds active.  Musculoskeletal: no clubbing / cyanosis. Left knee swelling, tenderness, and warmth.   Skin: no significant rashes, lesions, ulcers. Warm, dry, well-perfused. Neurologic: CN 2-12 grossly intact. Sensation intact. Moving all extremities.  Psychiatric: Alert and oriented to person, place, and situation. Calm and cooperative.    Labs and Imaging on Admission: I  have personally reviewed following labs and imaging studies  CBC: Recent Labs  Lab 07/05/20 1811  WBC 14.7*  HGB 12.1  HCT 38.1  MCV 98.2  PLT 712   Basic Metabolic Panel: Recent Labs  Lab 07/05/20 1811  NA 132*  K 3.4*  CL 100  CO2 25  GLUCOSE 106*  BUN 11  CREATININE 0.63  CALCIUM 9.1   GFR: Estimated Creatinine Clearance: 52.8 mL/min (by C-G formula based on SCr of 0.63 mg/dL). Liver Function Tests: Recent Labs  Lab 07/05/20 1811  AST 19  ALT 20  ALKPHOS 57  BILITOT 0.6  PROT 7.2  ALBUMIN 3.4*   No results for input(s): LIPASE, AMYLASE in the last 168 hours. No results for input(s): AMMONIA in the last 168 hours. Coagulation Profile: No results for input(s): INR, PROTIME in the last 168 hours. Cardiac Enzymes: No results for input(s): CKTOTAL, CKMB, CKMBINDEX, TROPONINI in the last 168 hours. BNP (last 3 results) No results for input(s): PROBNP in the last 8760 hours. HbA1C: No results for input(s): HGBA1C in the last 72 hours. CBG: No results for input(s): GLUCAP in the last 168 hours. Lipid Profile: No results for input(s): CHOL, HDL, LDLCALC, TRIG, CHOLHDL, LDLDIRECT in the last 72 hours. Thyroid Function Tests: No results for input(s): TSH, T4TOTAL, FREET4, T3FREE, THYROIDAB in the last 72 hours. Anemia Panel: No results for input(s): VITAMINB12, FOLATE, FERRITIN, TIBC, IRON, RETICCTPCT in the last 72 hours. Urine analysis:    Component Value Date/Time   COLORURINE YELLOW 03/31/2020 1658   APPEARANCEUR [place] (A) 03/31/2020 1658   LABSPEC  1.010 03/31/2020 1658   PHURINE 5.5 03/31/2020 [place] 03/31/2020 [place] 03/31/2020 1658   HGBUR trace-lysed 02/03/2010 [place] 03/31/2020 1658   BILIRUBINUR Negative 12/03/2018 [place] 03/31/2020 1658   PROTEINUR 30 (A) 11/06/2019 1638   UROBILINOGEN 0.2 03/31/2020 1658   NITRITE NEGATIVE 03/31/2020 1658   LEUKOCYTESUR SMALL (A)  03/31/2020 1658   Sepsis Labs: @LABRCNTIP (procalcitonin:4,lacticidven:4) ) Recent Results (from the past 240 hour(s))  Body fluid culture w Gram Stain     Status: None (Preliminary result)   Collection Time: 07/05/20  8:04 PM   Specimen: Synovium  Result Value Ref Range Status   Specimen Description SYNOVIAL  Final   Special Requests LEFT KNEE  Final   Gram Stain   Final    ABUNDANT WBC PRESENT, PREDOMINANTLY PMN NO ORGANISMS SEEN Performed at [facility], [address], [place], [place] [address]    Culture PENDING  Incomplete   Report Status PENDING  Incomplete     Radiological Exams on Admission: DG Knee Complete 4 Views Left  Result Date: 07/05/2020 CLINICAL DATA:  Left-sided knee pain EXAM: LEFT KNEE - COMPLETE 4+ VIEW COMPARISON:  None. FINDINGS: No fracture or malalignment. Advanced tricompartment arthritis. Joint space calcifications. Large suprapatellar knee effusion. Vascular calcifications. IMPRESSION: 1. 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 2. Chondrocalcinosis. No acute osseous abnormality. Electronically Signed   By: Donavan Foil M.D.   On: 07/05/2020 18:48    Assessment/Plan   1. Septic arthritis, left knee  - Presents with acute on chronic left knee pain and swelling and is found to be febrile with leukocytosis and tachycardia  - Left knee aspirated in ED and orthopedic surgery consulted by ED physician  - Continue empiric vancomycin, follow-up gram stain and cultures, follow-up ortho recommendations, hold Eliquis pending ortho eval    2. History of DVT  - History of provoked LLE DVT in August 2021  - Eliquis held pending orthopedic surgery evaluation, last dose was am of 07/05/20   3. Hyponatremia; hypokalemia  - Sodium is 132 in setting of hypovolemia and potassium is 3.4 on admission  - Hydrate with IVF overnight, supplement potassium, repeat chem panel    4. Asthma  - Not in exacerbation  - Continue Singulair, ICS/LABA,  and as-needed albuterol    5. Chronic pain  - Continue home regimen with gabapentin, amitriptyline, and as-needed oxycodone and Robaxin   6. Anxiety, insomnia  - Continue home regimen with low-dose Xanax and Ambien as needed    DVT prophylaxis: Eliquis pta, held on admission  Code Status: Full  Level of Care: Level of care: Telemetry Surgical Family Communication: Daughter updated at bedside Disposition Plan:  Patient is from: Home   Anticipated d/c is to: TBD Anticipated d/c date is: 07/08/20 Patient currently: Pending improvement in sepsis parameters, ortho consultation  Consults called: Orthopedic surgery  Admission status: Inpatient     Vianne Bulls, MD [facility]  07/05/2020, 11:23 PM

## 2020-07-05 NOTE — Telephone Encounter (Signed)
Caller : Roshawn  Call Back @ [phone]  Patient states she is having trouble out of her knee, patient states it's hard to get up.  Patient would like a call back in reference on what she can do to treat with knee pain   Please advise

## 2020-07-05 NOTE — Telephone Encounter (Signed)
Advised patient of note. She has not yet called the ortho yet.  She stated that she will go to the hospital and get it looked at, she now think it is sepsis again..  I advised here again to call ortho to let them know this as well.  She agreed to call.

## 2020-07-05 NOTE — ED Notes (Signed)
Report given to [facility] on 5N. No questions at this time.

## 2020-07-05 NOTE — ED Triage Notes (Signed)
Patient arrives from home with complaints of left leg pain. Patient states that last August she was diagnosed with a septic joint in left leg. Patient on Eloquist since August. States that she can walk on her leg, however she is in a lot of pain. Patient states the pain became unbearable on Saturday. Pain radiates from knee to foot on left leg.

## 2020-07-05 NOTE — ED Provider Notes (Signed)
[facility] EMERGENCY DEPARTMENT Provider Note   CSN: [id number] Arrival date & time: 07/05/20  1721     History Chief Complaint  Patient presents with  . Leg Pain    Patient reports history of septic joint in left leg.    NETHRA MEHLBERG is a 87 y.o. female.  HPI Patient is a 87 year old female with history of multiple medical problems including hypertension, hyperlipidemia, right knee total replacement, DVT in the past, septic arthritis in the past presenting with a chief complaint of left knee pain.  Patient states that over the past 2 to 3 days she has had progressive pain inside of her left knee.  She endorses some subjective fevers but denies nausea or vomiting, syncope or shortness of breath this time.  Patient is normally ambulatory but currently has pretty remarkable pain in the left knee.  Past Medical History:  Diagnosis Date  . ALLERGIC RHINITIS   . ANEMIA, CHRONIC    Malabsorption related to bypass hx, B12 and iron deficiency  . ANXIETY   . Arthritis   . ASTHMA   . Bariatric surgery status   . Bowel perforation ([place]) 2016  . C. difficile diarrhea   . Colon perforation ([place]) 08/2014   following polypetomy during colo, surgical repair  . COLONIC POLYPS, ADENOMATOUS, HX OF 2010  . DIVERTICULOSIS, COLON   . DVT (deep venous thrombosis) ([place]) 2018  . DVT (deep venous thrombosis) ([place])   . GERD (gastroesophageal reflux disease) 08/23/2015  . Graded compression stocking in place   . History of blood transfusion   . History of migraine   . History of rheumatic fever   . HSV (herpes simplex virus) infection 08/16/2015  . Hyperlipidemia   . Hypertension    Denies  . Hyponatremia 05/02/2016  . Incomplete left bundle branch block (LBBB) 2016   Noted on EKG  . INSOMNIA   . Lumbosacral disc disease    Chronic pain; History of osteomyelitis 2010 following ESI complication  . Obesity   . Osteomyelitis ([place])   . Pelvic fracture ([place])   . Pneumonia   .  Recurrent UTI 08/23/2015  . URINARY URGENCY   . Ventral hernia without obstruction or gangrene 05/22/2018  . VITAMIN B12 DEFICIENCY     Patient Active Problem List   Diagnosis Date Noted  . Hyperglycemia 02/11/2020  . Gout 02/11/2020  . Left foot pain 02/11/2020  . Cellulitis 12/20/2019  . Navicular fracture of ankle 12/20/2019  . Neck pain 12/20/2019  . Diarrhea 12/01/2019  . Late effects of accidental fall 11/13/2019  . DVT (deep venous thrombosis) ([place]) 11/13/2019  . Hypomagnesemia 11/13/2019  . Swelling of right hand 11/13/2019  . S/P TKR (total knee replacement), right 08/26/2019  . Ventral hernia without obstruction or gangrene 05/22/2018  . Greenfield filter in place 03/08/2017  . Pain of right lower extremity 03/08/2017  . Preventative health care 02/13/2017  . Hyponatremia 05/02/2016  . GERD (gastroesophageal reflux disease) 08/23/2015  . HSV (herpes simplex virus) infection 08/16/2015  . Hyperlipidemia, mild 08/16/2015  . Osteoporosis   . Hypokalemia   . Chronic diarrhea   . Anemia of chronic disease   . OSTEOMYELITIS, VERTEBRA 05/05/2009  . [place] DISEASE, LUMBOSACRAL SPINE 04/22/2009  . Essential hypertension 03/30/2009  . Vitamin B 12 deficiency 07/09/2008  . DIVERTICULOSIS, COLON 07/09/2008  . COLONIC POLYPS, ADENOMATOUS, HX OF 07/09/2008  . Anxiety state 08/08/2007  . INSOMNIA 08/08/2007  . Bariatric surgery status 08/08/2007  . Midline back  pain 10/30/2006  . ALLERGIC RHINITIS 09/18/2006    Past Surgical History:  Procedure Laterality Date  . [place]   for ruptured disc  . CHOLECYSTECTOMY    . COLONOSCOPY    . greenfield filter    . ileojejunal bypass  1976   for obesity  . LAPAROTOMY N/A 09/18/2014   Procedure: EXPLORATORY LAPAROTOMY WITH REPAIR OF CECIAL PERFORATION;  Surgeon: Autumn Messing III, MD;  Location: [facility];  Service: General;  Laterality: N/A;  . TONSILLECTOMY    . TOTAL KNEE ARTHROPLASTY Right 08/26/2019   Procedure: RIGHT TOTAL  KNEE ARTHROPLASTY, LEFT KNEE CORTISONE INJECTION;  Surgeon: Paralee Cancel, MD;  Location: [facility];  Service: Orthopedics;  Laterality: Right;  70 MINS  . UPPER GI ENDOSCOPY    . VASCULAR SURGERY Right    revascularization right lower extremity     OB History   No obstetric history on file.     Family History  Problem Relation Age of Onset  . Cervical cancer Mother   . Stroke Mother   . Liver disease Sister   . Kidney disease Sister   . Cirrhosis Sister   . Colon cancer Neg Hx   . Esophageal cancer Neg Hx   . Rectal cancer Neg Hx   . Stomach cancer Neg Hx     Social History   Tobacco Use  . Smoking status: Never Smoker  . Smokeless tobacco: Never Used  Substance Use Topics  . Alcohol use: No    Alcohol/week: 0.0 standard drinks  . Drug use: No    Home Medications Prior to Admission medications   Medication Sig Start Date End Date Taking? Authorizing Provider  acetaminophen (TYLENOL) 500 MG tablet Take 2 tablets (1,000 mg total) by mouth every 8 (eight) hours. 08/27/19   Maurice March, PA-C  albuterol (PROVENTIL HFA;VENTOLIN HFA) 108 (90 BASE) MCG/ACT inhaler Inhale 1-2 puffs into the lungs every 6 (six) hours as needed for wheezing or shortness of breath.    [provider]  ALPRAZolam Duanne Moron) 1 MG tablet Take 0.5-1 tablets (0.5-1 mg total) by mouth at bedtime as needed. 11/20/19   Mosie Lukes, MD  amitriptyline (ELAVIL) 100 MG tablet TAKE 1 TABLET (100 MG TOTAL) BY MOUTH DAILY. 03/29/20 03/29/21  Mosie Lukes, MD  amoxicillin-clavulanate (AUGMENTIN) 875-125 MG tablet Take 1 tablet by mouth twice daily 07/02/20     apixaban (ELIQUIS) 5 MG TABS tablet TAKE 1 TABLET BY MOUTH 2 TIMES DAILY 05/24/20 05/24/21  Mosie Lukes, MD  azelastine (OPTIVAR) 0.05 % ophthalmic solution Place 1 drop into both eyes daily as needed (for dry eyes).     [provider]  benzonatate (TESSALON) 100 MG capsule TAKE 1 CAPSULE BY MOUTH 3 TIMES DAILY AS NEEDED FOR COUGH 05/06/20  05/06/21  Mosie Lukes, MD  budesonide-formoterol [facility]) 160-4.5 MCG/ACT inhaler Inhale 2 puffs into the lungs See admin instructions. Inhale 2 puffs into the lungs one to two times a day 03/04/18   Saguier, Percell Miller, PA-C  cephALEXin (KEFLEX) 500 MG capsule TAKE 1 CAPSULE BY MOUTH 3 TIMES DAILY 04/09/20 04/09/21  Mosie Lukes, MD  cetirizine (ZYRTEC) 10 MG tablet Take 10 mg by mouth daily.  02/05/14   [provider]  cyanocobalamin (,VITAMIN B-12,) 1000 MCG/ML injection INJECT 1CC INTO THE MUSCLE EVERY MONTH 10/08/19 10/07/20  Gatha Mayer, MD  dicyclomine (BENTYL) 10 MG capsule TAKE 1 CAPSULE BY MOUTH 3 TIMES DAILY AS NEEDED FOR DIARRHEA AND SYMPTOMS  12/08/19   Gatha Mayer, MD  diphenoxylate-atropine (LOMOTIL) 2.5-0.025 MG tablet TAKE 2 TABLETS BY MOUTH 3 TIMES DAILY AS NEEDED FOR DIARRHEA 06/25/20 12/22/20  Gatha Mayer, MD  docusate sodium (COLACE) 100 MG capsule Take 1 capsule (100 mg total) by mouth 2 (two) times daily. Patient taking differently: Take 100 mg by mouth daily as needed for mild constipation.  08/27/19   Maurice March, PA-C  Fe Fum-FA-B Cmp-C-Zn-Mg-Mn-Cu (HEMOCYTE PLUS) 106-1 MG CAPS TAKE 1 CAPSULE BY MOUTH 2 TIMES DAILY 06/03/20 06/03/21  Gatha Mayer, MD  ferrous sulfate 325 (65 FE) MG tablet Take 1 tablet (325 mg total) by mouth 2 (two) times daily with a meal. 11/16/19   Allie Bossier, MD  fluticasone [facility]) 50 MCG/ACT nasal spray 2 sprays by Nasal route daily.      [provider]  gabapentin (NEURONTIN) 300 MG capsule TAKE 1 CAPSULE BY MOUTH AT BEDTIME 02/21/20 02/20/21  Mosie Lukes, MD  hydrOXYzine (ATARAX) 25 MG tablet Take 25 mg by mouth 3 (three) times daily as needed for itching.     [provider]  levocetirizine (XYZAL) 5 MG tablet Take 1 tablet (5 mg total) by mouth every evening. 08/04/14   Rowe Clack, MD  levocetirizine (XYZAL) 5 MG tablet TAKE 1 TABLET BY MOUTH EVERY EVENING 05/05/20 05/05/21  Mosetta Anis, MD   levocetirizine (XYZAL) 5 MG tablet TAKE 1 TABLET BY MOUTH EVERY EVENING 01/30/20 01/29/21  Mosetta Anis, MD  lubiprostone (AMITIZA) 24 MCG capsule Take 24 mcg by mouth daily as needed for constipation.    [provider]  magnesium oxide (MAG-OX 400) 400 MG tablet Take 400 mg by mouth 2 (two) times daily.     [provider]  methocarbamol (ROBAXIN) 500 MG tablet TAKE 1 TABLET BY MOUTH EVERY 8 HOURS AS NEEDED FOR MUSCLE SPASMS 06/15/20 06/15/21  Mosie Lukes, MD  montelukast (SINGULAIR) 10 MG tablet Take 10 mg by mouth daily as needed (For allergy).  02/11/14   [provider]  montelukast (SINGULAIR) 10 MG tablet TAKE 1 TABLET BY MOUTH EVERY EVENING 04/13/20 04/13/21  Mosetta Anis, MD  montelukast (SINGULAIR) 10 MG tablet TAKE 1 TABLET BY MOUTH EVERY EVENING 04/12/20 04/12/21  Mosetta Anis, MD  montelukast (SINGULAIR) 10 MG tablet TAKE 1 TABLET BY MOUTH ONCE DAILY IN THE EVENING 01/12/20 01/11/21  Mosetta Anis, MD  nitrofurantoin (MACRODANTIN) 100 MG capsule Take 1 capsule by mouth at bedtime.  Begin after finishing Augmentin 07/02/20     omeprazole (PRILOSEC) 20 MG capsule TAKE 1 CAPSULE BY MOUTH ONCE DAILY 06/28/20 06/28/21  Gatha Mayer, MD  ondansetron (ZOFRAN-ODT) 4 MG disintegrating tablet Take 1 tablet (4 mg total) by mouth 3 (three) times daily as needed for nausea or vomiting. 08/27/19   Maurice March, PA-C  oxyCODONE (OXY IR/ROXICODONE) 5 MG immediate release tablet Take 1 tablet (5 mg total) by mouth every 6 (six) hours as needed for severe pain. 12/14/19   Wieters, Hallie C, PA-C  potassium chloride SA (KLOR-CON) 20 MEQ tablet TAKE 1 TABLET BY MOUTH ONCE A DAY 03/24/20 03/24/21  Mosie Lukes, MD  Probiotic Product (PROBIOTIC DAILY PO) Take 1 capsule by mouth daily.    [provider]  tamsulosin (FLOMAX) 0.4 MG CAPS capsule Take 1 capsule (0.4 mg total) by mouth daily after supper. 11/16/19   Allie Bossier, MD  valACYclovir (VALTREX) 500 MG  tablet TAKE 1 TABLET BY MOUTH ONCE A DAY AS  NEEDED. 01/21/20 01/20/21  Mosie Lukes, MD  Vitamin D, Ergocalciferol, (DRISDOL) 1.25 MG (50000 UNIT) CAPS capsule TAKE 1 CAPSULE BY MOUTH EVERY 7 DAYS 09/25/19 09/24/20  Mosie Lukes, MD  zolpidem (AMBIEN) 5 MG tablet TAKE 1 TABLET BY MOUTH AT BEDTIME 04/16/20 10/13/20  Mosie Lukes, MD    Allergies    Ativan [lorazepam], Other, Azithromycin, Benadryl [diphenhydramine hcl], Ciprofloxacin hcl, Codeine phosphate, Hydrocodone, Hydrocodone-homatropine, Levaquin [levofloxacin in d5w], Levofloxacin, Meperidine hcl, Molds & smuts, Sulfa antibiotics, Tape, Tramadol, and Ultram [tramadol hcl]  Review of Systems   Review of Systems  Constitutional: Positive for fever. Negative for chills.  HENT: Negative for ear pain and sore throat.   Eyes: Negative for pain and visual disturbance.  Respiratory: Negative for cough and shortness of breath.   Cardiovascular: Negative for chest pain and palpitations.  Gastrointestinal: Negative for abdominal pain and vomiting.  Genitourinary: Negative for dysuria and hematuria.  Musculoskeletal: Positive for joint swelling. Negative for arthralgias and back pain.  Skin: Negative for color change and rash.  Neurological: Negative for seizures and syncope.  All other systems reviewed and are negative.   Physical Exam Updated Vital Signs There were no vitals taken for this visit.  Physical Exam Vitals and nursing note reviewed.  Constitutional:      General: She is not in acute distress.    Appearance: She is well-developed.  HENT:     Head: Normocephalic and atraumatic.  Eyes:     Conjunctiva/sclera: Conjunctivae normal.  Cardiovascular:     Rate and Rhythm: Normal rate and regular rhythm.     Heart sounds: No murmur heard.   Pulmonary:     Effort: Pulmonary effort is normal. No respiratory distress.     Breath sounds: Normal breath sounds.  Abdominal:     General: There is no distension.      Palpations: Abdomen is soft.     Tenderness: There is no abdominal tenderness. There is no right CVA tenderness or left CVA tenderness.  Musculoskeletal:        General: No swelling or tenderness. Normal range of motion.     Cervical back: Neck supple.     Comments: Notably limited range of motion of the left knee.  Exquisite tenderness palpation all throughout it.  Skin:    General: Skin is warm and dry.  Neurological:     General: No focal deficit present.     Mental Status: She is alert and oriented to person, place, and time. Mental status is at baseline.     Cranial Nerves: No cranial nerve deficit.     ED Results / Procedures / Treatments   Labs (all labs ordered are listed, but only abnormal results are displayed) Labs Reviewed - No data to display Medications Ordered in ED Medications - No data to display  ED Course  I have reviewed the triage vital signs and the nursing notes.  Pertinent labs & imaging results that were available during my care of the patient were reviewed by me and considered in my medical decision making (see chart for details).  After consent was obtained, using sterile technique the left knee was prepped and plain Lidocaine 1% was used as local anesthetic. The joint was entered and 10 ml's of red/serous colored fluid was withdrawn and sent for culture, stain, and cell count.  Procedure was completed with ultrasound assistance and palpation of landmarks.  Site dressed and procedure well-tolerated.   MDM Rules/Calculators/A&P  Patient is an 87 year old  female presenting with a chief complaint of progressive left knee pain and subjective fevers.  On exam, patient with limited range of motion of the left knee and vital signs notable for fever and mild tachycardia. Left knee erythematous and tender to palpation. Patient's history of present illness and physical exam findings are most consistent with recurrent septic arthritis versus inflammatory arthritis.   We will proceed with arthrocentesis completed as above without difficulty.  Fluid sample with elevated white blood cell count to 93% neutrophils.  Orthopedics communicated with recommended admission pending culture results.  Patient started on antibiotics and admitted to medicine.  Patient's vital signs stable for admission to floor at this time.  Patient admitted ready to move. Final Clinical Impression(s) / ED Diagnoses Final diagnoses:  None    Rx / DC Orders ED Discharge Orders    None       Tretha Sciara, MD 07/05/20 1572    Lajean Saver, MD 07/07/20 [phone]

## 2020-07-05 NOTE — ED Notes (Signed)
Attempted to call report. No answer at this time.

## 2020-07-05 NOTE — ED Notes (Signed)
Lab called stating there was not enough synovial fluid to run Protein and glucose to send to [facility]

## 2020-07-05 NOTE — ED Notes (Signed)
Fluid delivered to lab by Apolonio Schneiders EDT.

## 2020-07-05 NOTE — Telephone Encounter (Signed)
Thank you, agree Ortho is the appropriate place to address at this time.

## 2020-07-06 DIAGNOSIS — M009 Pyogenic arthritis, unspecified: Secondary | ICD-10-CM | POA: Diagnosis not present

## 2020-07-06 LAB — APTT: aPTT: 44 seconds — ABNORMAL HIGH (ref 24–36)

## 2020-07-06 LAB — BASIC METABOLIC PANEL
Anion gap: 8 (ref 5–15)
BUN: 10 mg/dL (ref 8–23)
CO2: 23 mmol/L (ref 22–32)
Calcium: 8.8 mg/dL — ABNORMAL LOW (ref 8.9–10.3)
Chloride: 102 mmol/L (ref 98–111)
Creatinine, Ser: 0.63 mg/dL (ref 0.44–1.00)
GFR, Estimated: >60 mL/min (ref >60)
Glucose, Bld: 112 mg/dL — ABNORMAL HIGH (ref 70–99)
Potassium: 3.7 mmol/L (ref 3.5–5.1)
Sodium: 133 mmol/L — ABNORMAL LOW (ref 135–145)

## 2020-07-06 LAB — MAGNESIUM: Magnesium: 1.8 mg/dL (ref 1.7–2.4)

## 2020-07-06 LAB — CBC
HCT: 36.1 % (ref 36.0–46.0)
Hemoglobin: 11.4 g/dL — ABNORMAL LOW (ref 12.0–15.0)
MCH: 31.1 pg (ref 26.0–34.0)
MCHC: 31.6 g/dL (ref 30.0–36.0)
MCV: 98.6 fL (ref 80.0–100.0)
Platelets: 226 10*3/uL (ref 150–400)
RBC: 3.66 MIL/uL — ABNORMAL LOW (ref 3.87–5.11)
RDW: 14.5 % (ref 11.5–15.5)
WBC: 13.5 10*3/uL — ABNORMAL HIGH (ref 4.0–10.5)
nRBC: 0 % (ref 0.0–0.2)

## 2020-07-06 LAB — SARS CORONAVIRUS 2 (TAT 6-24 HRS): SARS Coronavirus 2: NEGATIVE

## 2020-07-06 LAB — SURGICAL PCR SCREEN
MRSA, PCR: NEGATIVE
Staphylococcus aureus: NEGATIVE

## 2020-07-06 MED ORDER — VANCOMYCIN HCL 1000 MG/200ML IV SOLN
1000.0000 mg | INTRAVENOUS | Status: DC
  Administered 2020-07-06: 1000 mg via INTRAVENOUS
  Filled 2020-07-06 (×2): qty 200

## 2020-07-06 MED ORDER — SODIUM CHLORIDE 0.9 % IV SOLN
2.0000 g | INTRAVENOUS | Status: DC
  Administered 2020-07-06 – 2020-07-07 (×2): 2 g via INTRAVENOUS
  Filled 2020-07-06 (×2): qty 20

## 2020-07-06 MED ORDER — COLCHICINE 0.6 MG PO TABS
0.6000 mg | ORAL_TABLET | Freq: Once | ORAL | Status: AC
  Administered 2020-07-06: 0.6 mg via ORAL
  Filled 2020-07-06: qty 1

## 2020-07-06 MED ORDER — MORPHINE SULFATE (PF) 2 MG/ML IV SOLN: 2.0000 mg | INTRAVENOUS | Status: DC | PRN

## 2020-07-06 MED ORDER — KETOROLAC TROMETHAMINE 15 MG/ML IJ SOLN
15.0000 mg | Freq: Four times a day (QID) | INTRAMUSCULAR | Status: AC
  Administered 2020-07-06 – 2020-07-07 (×4): 15 mg via INTRAVENOUS
  Filled 2020-07-06 (×4): qty 1

## 2020-07-06 MED ORDER — ACETAMINOPHEN 650 MG RE SUPP: 650.0000 mg | Freq: Three times a day (TID) | RECTAL | Status: DC

## 2020-07-06 MED ORDER — SODIUM CHLORIDE 0.9 % IV SOLN: INTRAVENOUS | Status: DC

## 2020-07-06 MED ORDER — COLCHICINE 0.6 MG PO TABS
1.2000 mg | ORAL_TABLET | Freq: Once | ORAL | Status: AC
  Administered 2020-07-06: 1.2 mg via ORAL
  Filled 2020-07-06: qty 2

## 2020-07-06 MED ORDER — ACETAMINOPHEN 325 MG PO TABS
650.0000 mg | ORAL_TABLET | Freq: Three times a day (TID) | ORAL | Status: DC
  Administered 2020-07-06 – 2020-07-10 (×13): 650 mg via ORAL
  Filled 2020-07-06 (×13): qty 2

## 2020-07-06 MED ORDER — DIPHENOXYLATE-ATROPINE 2.5-0.025 MG PO TABS
1.0000 | ORAL_TABLET | Freq: Once | ORAL | Status: AC
  Administered 2020-07-06: 1 via ORAL
  Filled 2020-07-06: qty 1

## 2020-07-06 NOTE — Consult Note (Signed)
Orthopaedic Trauma Service (OTS) Consult   Patient ID: Alexis Olson MRN: [id number] DOB/AGE: Jun 17, 1933 87 y.o.   Reason for Consult: Acute on chronic left knee pain Referring Physician: Mitzi Hansen, MD (internal medicine)   HPI: Alexis Olson is an 87 y.o. female with history of right total knee arthroplasty and chronic left knee pain who is being followed by Dr. Alvan Dame for eventual total knee replacement on the left (he did her R knee 2021).  Patient had onset of increasing left knee pain on Sunday.  It got to the point where she was unable to really bear weight.  She ultimately presented to the emergency department for evaluation.  Patient reports a history of previous septic arthritis in her left foot but I do not see any hospital documentation related to that.  She states that she did not have surgery on that extremity and was treated with IV antibiotics.  She is also on chronic anticoagulation for DVT in her left leg and has been on anticoagulation since August.  Patient uses a walker at baseline does use a cane at times as well.  Denies history of gout/pseudogout  Patient has not done anything for her pain.  She does intermittent topical NSAID but has not done any in about a week.  No other anti-inflammatories.  She has not tried any ice.  Subjective fevers.  No chest pain or shortness of breath  Inflammatory markers elevated Mild elevation white blood cell count Lactic acid normal  T-max 100.4 on admission  Patient was with her husband  Patient started on empiric antibiotics Rocephin and vancomycin  Past Medical History:  Diagnosis Date  . ALLERGIC RHINITIS   . ANEMIA, CHRONIC    Malabsorption related to bypass hx, B12 and iron deficiency  . ANXIETY   . Arthritis   . ASTHMA   . Bariatric surgery status   . Bowel perforation ([place]) 2016  . C. difficile diarrhea   . Colon perforation ([place]) 08/2014   following polypetomy during colo, surgical repair  .  COLONIC POLYPS, ADENOMATOUS, HX OF 2010  . DIVERTICULOSIS, COLON   . DVT (deep venous thrombosis) ([place]) 2018  . DVT (deep venous thrombosis) ([place])   . GERD (gastroesophageal reflux disease) 08/23/2015  . Graded compression stocking in place   . History of blood transfusion   . History of migraine   . History of rheumatic fever   . HSV (herpes simplex virus) infection 08/16/2015  . Hyperlipidemia   . Hypertension    Denies  . Hyponatremia 05/02/2016  . Incomplete left bundle branch block (LBBB) 2016   Noted on EKG  . INSOMNIA   . Lumbosacral disc disease    Chronic pain; History of osteomyelitis 2010 following ESI complication  . Obesity   . Osteomyelitis ([place])   . Pelvic fracture ([place])   . Pneumonia   . Recurrent UTI 08/23/2015  . URINARY URGENCY   . Ventral hernia without obstruction or gangrene 05/22/2018  . VITAMIN B12 DEFICIENCY     Past Surgical History:  Procedure Laterality Date  . [place]   for ruptured disc  . CHOLECYSTECTOMY    . COLONOSCOPY    . greenfield filter    . ileojejunal bypass  1976   for obesity  . LAPAROTOMY N/A 09/18/2014   Procedure: EXPLORATORY LAPAROTOMY WITH REPAIR OF CECIAL PERFORATION;  Surgeon: Autumn Messing III, MD;  Location: [facility];  Service: General;  Laterality: N/A;  .  TONSILLECTOMY    . TOTAL KNEE ARTHROPLASTY Right 08/26/2019   Procedure: RIGHT TOTAL KNEE ARTHROPLASTY, LEFT KNEE CORTISONE INJECTION;  Surgeon: Paralee Cancel, MD;  Location: [facility];  Service: Orthopedics;  Laterality: Right;  70 MINS  . UPPER GI ENDOSCOPY    . VASCULAR SURGERY Right    revascularization right lower extremity    Family History  Problem Relation Age of Onset  . Cervical cancer Mother   . Stroke Mother   . Liver disease Sister   . Kidney disease Sister   . Cirrhosis Sister   . Colon cancer Neg Hx   . Esophageal cancer Neg Hx   . Rectal cancer Neg Hx   . Stomach cancer Neg Hx     Social History:  reports that she has never smoked. She has never  used smokeless tobacco. She reports that she does not drink alcohol and does not use drugs.  Allergies:  Allergies  Allergen Reactions  . Ativan [Lorazepam] Anaphylaxis and Other (See Comments)    Deathly allergic; "resp rate dropped dropped to 6"  . Other Anaphylaxis    Red peppers--choking, also  . Azithromycin Diarrhea  . Benadryl [Diphenhydramine Hcl] Other (See Comments)    Paradoxical response  . Ciprofloxacin Hcl Other (See Comments)    Causes yeast infection and refuses to take  . Codeine Phosphate Nausea Only    Can tolerate in limited amounts  . Hydrocodone Itching and Nausea And Vomiting  . Hydrocodone-Homatropine Itching and Nausea And Vomiting  . Levaquin [Levofloxacin In D5w] Other (See Comments)    Muscle soreness  . Levofloxacin   . Meperidine Hcl Other (See Comments)    Reaction ??  . Molds & Smuts Other (See Comments)    unsure  . Sulfa Antibiotics Other (See Comments)    Joint pain  . Tape Other (See Comments)    No Coban wrap (per the patient)  . Tramadol   . Ultram [Tramadol Hcl] Other (See Comments)    "Can't move joints"    Medications: I have reviewed the patient's current medications. Current Meds  Medication Sig  . acetaminophen (TYLENOL) 500 MG tablet Take 2 tablets (1,000 mg total) by mouth every 8 (eight) hours.  Marland Kitchen albuterol (PROVENTIL HFA;VENTOLIN HFA) 108 (90 BASE) MCG/ACT inhaler Inhale 1-2 puffs into the lungs every 6 (six) hours as needed for wheezing or shortness of breath.  . ALPRAZolam (XANAX) 1 MG tablet Take 0.5-1 tablets (0.5-1 mg total) by mouth at bedtime as needed. (Patient taking differently: Take 0.5-1 mg by mouth at bedtime as needed for anxiety or sleep.)  . amitriptyline (ELAVIL) 100 MG tablet TAKE 1 TABLET (100 MG TOTAL) BY MOUTH DAILY. (Patient taking differently: Take 100 mg by mouth daily.)  . amoxicillin-clavulanate (AUGMENTIN) 875-125 MG tablet Take 1 tablet by mouth twice daily  . apixaban (ELIQUIS) 5 MG TABS tablet TAKE  1 TABLET BY MOUTH 2 TIMES DAILY (Patient taking differently: Take 5 mg by mouth 2 (two) times daily.)  . azelastine (OPTIVAR) 0.05 % ophthalmic solution Place 1 drop into both eyes daily as needed (for dry eyes).  . benzonatate (TESSALON) 100 MG capsule TAKE 1 CAPSULE BY MOUTH 3 TIMES DAILY AS NEEDED FOR COUGH  . budesonide-formoterol (SYMBICORT) 160-4.5 MCG/ACT inhaler Inhale 2 puffs into the lungs See admin instructions. Inhale 2 puffs into the lungs one to two times a day  . cetirizine (ZYRTEC) 10 MG tablet Take 10 mg by mouth daily.   . cyanocobalamin (,VITAMIN B-12,) 1000 MCG/ML  injection INJECT 1CC INTO THE MUSCLE EVERY MONTH (Patient taking differently: Inject 1,000 mcg into the muscle every 30 (thirty) days.)  . dicyclomine (BENTYL) 10 MG capsule TAKE 1 CAPSULE BY MOUTH 3 TIMES DAILY AS NEEDED FOR DIARRHEA AND SYMPTOMS (Patient taking differently: Take 10 mg by mouth 3 (three) times daily as needed for spasms.)  . diphenoxylate-atropine (LOMOTIL) 2.5-0.025 MG tablet TAKE 2 TABLETS BY MOUTH 3 TIMES DAILY AS NEEDED FOR DIARRHEA (Patient taking differently: Take 1 tablet by mouth 3 (three) times daily as needed for diarrhea or loose stools.)  . docusate sodium (COLACE) 100 MG capsule Take 1 capsule (100 mg total) by mouth 2 (two) times daily. (Patient taking differently: Take 100 mg by mouth daily as needed for mild constipation.)  . Fe Fum-FA-B Cmp-C-Zn-Mg-Mn-Cu (HEMOCYTE PLUS) 106-1 MG CAPS TAKE 1 CAPSULE BY MOUTH 2 TIMES DAILY  . ferrous sulfate 325 (65 FE) MG tablet Take 1 tablet (325 mg total) by mouth 2 (two) times daily with a meal.  . fluticasone (FLONASE) 50 MCG/ACT nasal spray Place 2 sprays into the nose daily.  Marland Kitchen gabapentin (NEURONTIN) 300 MG capsule TAKE 1 CAPSULE BY MOUTH AT BEDTIME  . hydrOXYzine (ATARAX) 25 MG tablet Take 25 mg by mouth 3 (three) times daily as needed for itching.  . levocetirizine (XYZAL) 5 MG tablet Take 1 tablet (5 mg total) by mouth every evening.  .  lubiprostone (AMITIZA) 24 MCG capsule Take 24 mcg by mouth daily as needed for constipation.  . magnesium oxide (MAG-OX) 400 MG tablet Take 400 mg by mouth 2 (two) times daily.  . methocarbamol (ROBAXIN) 500 MG tablet TAKE 1 TABLET BY MOUTH EVERY 8 HOURS AS NEEDED FOR MUSCLE SPASMS  . montelukast (SINGULAIR) 10 MG tablet Take 10 mg by mouth daily as needed (For allergy).   . nitrofurantoin (MACRODANTIN) 100 MG capsule Take 1 capsule by mouth at bedtime.  Begin after finishing Augmentin (Patient taking differently: Take 100 mg by mouth at bedtime.)  . omeprazole (PRILOSEC) 20 MG capsule TAKE 1 CAPSULE BY MOUTH ONCE DAILY (Patient taking differently: Take 20 mg by mouth daily.)  . ondansetron (ZOFRAN-ODT) 4 MG disintegrating tablet Take 1 tablet (4 mg total) by mouth 3 (three) times daily as needed for nausea or vomiting.  Marland Kitchen oxyCODONE (OXY IR/ROXICODONE) 5 MG immediate release tablet Take 1 tablet (5 mg total) by mouth every 6 (six) hours as needed for severe pain.  . potassium chloride SA (KLOR-CON) 20 MEQ tablet TAKE 1 TABLET BY MOUTH ONCE A DAY (Patient taking differently: Take 20 mEq by mouth daily.)  . Probiotic Product (PROBIOTIC DAILY PO) Take 1 capsule by mouth daily.  . tamsulosin (FLOMAX) 0.4 MG CAPS capsule Take 1 capsule (0.4 mg total) by mouth daily after supper.  . valACYclovir (VALTREX) 500 MG tablet TAKE 1 TABLET BY MOUTH ONCE A DAY AS NEEDED. (Patient taking differently: Take 500 mg by mouth daily as needed (For outbreaks).)  . Vitamin D, Ergocalciferol, (DRISDOL) 1.25 MG (50000 UNIT) CAPS capsule TAKE 1 CAPSULE BY MOUTH EVERY 7 DAYS (Patient taking differently: Take 50,000 Units by mouth every 7 (seven) days.)  . zolpidem (AMBIEN) 5 MG tablet TAKE 1 TABLET BY MOUTH AT BEDTIME (Patient taking differently: Take 5 mg by mouth at bedtime.)     Results for orders placed or performed during the hospital encounter of 07/05/20 (from the past 48 hour(s))  Blood culture (routine x 2)      Status: None (Preliminary result)   Collection Time:  07/05/20  5:50 PM   Specimen: BLOOD  Result Value Ref Range   Specimen Description BLOOD RIGHT ARM    Special Requests      BOTTLES DRAWN AEROBIC AND ANAEROBIC Blood Culture adequate volume   Culture      NO GROWTH < 12 HOURS Performed at [facility], [place] [address] [place], [place] [address]    Report Status PENDING   CBC     Status: Abnormal   Collection Time: 07/05/20  6:11 PM  Result Value Ref Range   WBC 14.7 (H) 4.0 - 10.5 K/uL   RBC 3.88 3.87 - 5.11 MIL/uL   Hemoglobin 12.1 12.0 - 15.0 g/dL   HCT 38.1 36.0 - 46.0 %   MCV 98.2 80.0 - 100.0 fL   MCH 31.2 26.0 - 34.0 pg   MCHC 31.8 30.0 - 36.0 g/dL   RDW 14.4 11.5 - 15.5 %   Platelets 241 150 - 400 K/uL   nRBC 0.0 0.0 - 0.2 %    Comment: Performed at [facility], [place] [address], [place], [place] [address]  Comprehensive metabolic panel     Status: Abnormal   Collection Time: 07/05/20  6:11 PM  Result Value Ref Range   Sodium 132 (L) 135 - 145 mmol/L   Potassium 3.4 (L) 3.5 - 5.1 mmol/L   Chloride 100 98 - 111 mmol/L   CO2 25 22 - 32 mmol/L   Glucose, Bld 106 (H) 70 - 99 mg/dL    Comment: Glucose reference range applies only to samples taken after fasting for at least 8 hours.   BUN 11 8 - 23 mg/dL   Creatinine, Ser 0.63 0.44 - 1.00 mg/dL   Calcium 9.1 8.9 - 10.3 mg/dL   Total Protein 7.2 6.5 - 8.1 g/dL   Albumin 3.4 (L) 3.5 - 5.0 g/dL   AST 19 15 - 41 U/L   ALT 20 0 - 44 U/L   Alkaline Phosphatase 57 38 - 126 U/L   Total Bilirubin 0.6 0.3 - 1.2 mg/dL   GFR, Estimated >60 >60 mL/min    Comment: (NOTE) Calculated using the CKD-EPI Creatinine Equation (2021)    Anion gap 7 5 - 15    Comment: Performed at [facility] [address], [place], [place] [address]  Sedimentation rate     Status: Abnormal   Collection Time: 07/05/20  6:11 PM  Result Value Ref Range   Sed Rate 45 (H) 0 - 22 mm/hr    Comment: Performed at [facility] [address] [place], [place] [address]  Lactic acid, plasma     Status: None   Collection Time: 07/05/20  6:11 PM  Result Value Ref Range   Lactic Acid, Venous 1.1 0.5 - 1.9 mmol/L    Comment: Performed at [facility] [address] [place], [place] [address]  C-reactive protein     Status: Abnormal   Collection Time: 07/05/20  6:11 PM  Result Value Ref Range   CRP 19.2 (H) <1.0 mg/dL    Comment: Performed at [facility] [address] [place], [place] [address]  Blood culture (routine x 2)     Status: None (Preliminary result)   Collection Time: 07/05/20  6:13 PM   Specimen: BLOOD  Result Value Ref Range   Specimen Description BLOOD SITE NOT SPECIFIED    Special Requests      BOTTLES DRAWN AEROBIC AND ANAEROBIC Blood  Culture adequate volume   Culture      NO GROWTH < 12 HOURS Performed at [facility] [address] [place], [place] [address]    Report Status PENDING   Synovial cell count + diff, w/ crystals     Status: Abnormal   Collection Time: 07/05/20  6:26 PM  Result Value Ref Range   Color, Synovial RED (A) YELLOW   Appearance-Synovial TURBID (A) CLEAR   Crystals, Fluid INTRACELLULAR CALCIUM PYROPHOSPHATE CRYSTALS    WBC, Synovial 49,000 (H) 0 - 200 /cu mm   Neutrophil, Synovial 93 (H) 0 - 25 %   Lymphocytes-Synovial Fld 1 0 - 20 %   Monocyte-Macrophage-Synovial Fluid 6 (L) 50 - 90 %   Eosinophils-Synovial 0 0 - 1 %    Comment: Performed at [facility] [address] [place], [place] [address]  Body fluid culture w Gram Stain     Status: None (Preliminary result)   Collection Time: 07/05/20  8:04 PM   Specimen: Synovium  Result Value Ref Range   Specimen Description SYNOVIAL    Special Requests LEFT KNEE    Gram Stain      ABUNDANT WBC PRESENT, PREDOMINANTLY PMN NO ORGANISMS SEEN    Culture      NO GROWTH < 12 HOURS Performed at [facility] [address], [place], [place] [address]    Report Status PENDING   SARS  CORONAVIRUS 2 (TAT 6-24 HRS) Nasopharyngeal Nasopharyngeal Swab     Status: None   Collection Time: 07/05/20 10:38 PM   Specimen: Nasopharyngeal Swab  Result Value Ref Range   SARS Coronavirus 2 NEGATIVE NEGATIVE    Comment: (NOTE) SARS-CoV-2 target nucleic acids are NOT DETECTED.  The SARS-CoV-2 RNA is generally detectable in upper and lower respiratory specimens during the acute phase of infection. Negative results do not preclude SARS-CoV-2 infection, do not rule out co-infections with other pathogens, and should not be used as the sole basis for treatment or other patient management decisions. Negative results must be combined with clinical observations, patient history, and epidemiological information. The expected result is Negative.  Fact Sheet for Patients: SugarRoll.be  Fact Sheet for Healthcare Providers: https://www.woods-mathews.com/  This test is not yet approved or cleared by the [place] FDA and  has been authorized for detection and/or diagnosis of SARS-CoV-2 by FDA under an Emergency Use Authorization (EUA). This EUA will remain  in effect (meaning this test can be used) for the duration of the COVID-19 declaration under Se ction 564(b)(1) of the Act, 21 U.S.C. section 360bbb-3(b)(1), unless the authorization is terminated or revoked sooner.  Performed at [facility], [place] [address], [place], [place] [address]   APTT     Status: Abnormal   Collection Time: 07/06/20 12:33 AM  Result Value Ref Range   aPTT 44 (H) 24 - 36 seconds    Comment:        IF BASELINE aPTT IS ELEVATED, SUGGEST PATIENT RISK ASSESSMENT BE USED TO DETERMINE APPROPRIATE ANTICOAGULANT THERAPY. Performed at [facility], [place] [address], [place], [place] [address]   Basic metabolic panel     Status: Abnormal   Collection Time: 07/06/20 12:33 AM  Result Value Ref Range   Sodium 133 (L) 135 - 145 mmol/L   Potassium 3.7 3.5 - 5.1  mmol/L   Chloride 102 98 - 111 mmol/L   CO2 23 22 - 32 mmol/L   Glucose, Bld 112 (H) 70 -  99 mg/dL    Comment: Glucose reference range applies only to samples taken after fasting for at least 8 hours.   BUN 10 8 - 23 mg/dL   Creatinine, Ser 0.63 0.44 - 1.00 mg/dL   Calcium 8.8 (L) 8.9 - 10.3 mg/dL   GFR, Estimated >60 >60 mL/min    Comment: (NOTE) Calculated using the CKD-EPI Creatinine Equation (2021)    Anion gap 8 5 - 15    Comment: Performed at [facility] [address] [place], [place] [address]  Magnesium     Status: None   Collection Time: 07/06/20 12:33 AM  Result Value Ref Range   Magnesium 1.8 1.7 - 2.4 mg/dL    Comment: Performed at [facility] [address], [place], [place] [address]  CBC     Status: Abnormal   Collection Time: 07/06/20 12:33 AM  Result Value Ref Range   WBC 13.5 (H) 4.0 - 10.5 K/uL   RBC 3.66 (L) 3.87 - 5.11 MIL/uL   Hemoglobin 11.4 (L) 12.0 - 15.0 g/dL   HCT 36.1 36.0 - 46.0 %   MCV 98.6 80.0 - 100.0 fL   MCH 31.1 26.0 - 34.0 pg   MCHC 31.6 30.0 - 36.0 g/dL   RDW 14.5 11.5 - 15.5 %   Platelets 226 150 - 400 K/uL   nRBC 0.0 0.0 - 0.2 %    Comment: Performed at [facility], [place] [address] [place], [place] [address]  Surgical pcr screen     Status: None   Collection Time: 07/06/20  1:45 AM   Specimen: Nasal Mucosa; Nasal Swab  Result Value Ref Range   MRSA, PCR NEGATIVE NEGATIVE   Staphylococcus aureus NEGATIVE NEGATIVE    Comment: (NOTE) The Xpert SA Assay (FDA approved for NASAL specimens in patients 8 years of age and older), is one component of a comprehensive surveillance program. It is not intended to diagnose infection nor to guide or monitor treatment. Performed at [facility], [place] [address], [place], [place] [address]     DG Knee Complete 4 Views Left  Result Date: 07/05/2020 CLINICAL DATA:  Left-sided knee pain EXAM: LEFT KNEE - COMPLETE 4+ VIEW COMPARISON:  None. FINDINGS: No fracture or  malalignment. Advanced tricompartment arthritis. Joint space calcifications. Large suprapatellar knee effusion. Vascular calcifications. IMPRESSION: 1. 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 2. Chondrocalcinosis. No acute osseous abnormality. Electronically Signed   By: Donavan Foil M.D.   On: 07/05/2020 18:48    Intake/Output      04/11 0701 04/12 0700 04/12 0701 04/13 0700   P.O. 0    I.V. (mL/kg) 138.6 (1.8)    IV Piggyback 902.4    Total Intake(mL/kg) 1041 (13.5)    Net +1041            Review of Systems  Constitutional: Positive for malaise/fatigue. Negative for chills and fever.  HENT: Negative for sore throat.   Respiratory: Negative for shortness of breath.   Cardiovascular: Negative for chest pain and palpitations.  Gastrointestinal: Negative for nausea.  Genitourinary: Negative for dysuria.  Musculoskeletal: Positive for joint pain (L knee pain ).  Skin: Negative for itching and rash.  Neurological: Negative for dizziness, tingling and sensory change.   Blood pressure 140/63, pulse (!) 55, temperature 100.2 F (37.9 C), temperature source Oral, resp. rate 18, weight 77.1 kg, SpO2 92 %. Physical Exam Vitals and nursing note reviewed.  Constitutional:  General: She is awake. She is not in acute distress.    Appearance: Normal appearance. She is well-developed.  Cardiovascular:     Rate and Rhythm: Normal rate and regular rhythm.     Heart sounds: S1 normal and S2 normal.  Pulmonary:     Effort: Pulmonary effort is normal.     Breath sounds: Normal breath sounds.  Abdominal:     Comments: Soft, NT, ND, + BS   Musculoskeletal:     Comments: Left Lower Extremity  Moderate left knee effusion persists No erythema whatsoever Band-Aid over previous aspiration site superolateral left knee Patient does not want to perform knee range of motion No pain with gentle patella mobilization Diffuse tenderness along her joint line No streaking  erythema down the lower leg + DP pulse Extremity is warm No deep calf tenderness Compartments are soft Hip, lower leg, ankle and foot are unremarkable No wounds appreciated DPN, SPN, TN sensory function intact EHL, FHL, lesser toe motor function intact Ankle flexion, extension, inversion and eversion intact   Skin:    General: Skin is warm.     Capillary Refill: Capillary refill takes less than 2 seconds.  Neurological:     General: No focal deficit present.     Mental Status: She is alert and oriented to person, place, and time.  Psychiatric:        Attention and Perception: Attention and perception normal.        Mood and Affect: Mood normal.        Behavior: Behavior is cooperative.        Cognition and Memory: Cognition normal.      Assessment/Plan:  87 year old female admitted with acute on chronic left knee pain  -Acute on chronic left knee pain  Clinical picture not overly suggestive of septic arthritis.  Synovial fluid analysis shows WBC count at 49,000, this is seen with inflammatory arthropathy.  There were crystals on synovial fluid as well which favor crystalline arthropathy (pseudogout).  There could also be a superimposed hemarthrosis given her chronic anticoagulation as synovial fluid was noted to be bloody on inspection   Currently her Gram stain did not show any organisms.  Synovial fluid culture is pending   I do not see any obvious indication to take her to the operating room at this time.   Given the synovial fluid findings would also be inclined to stop her empiric antibiotics   Patient does still have a fairly moderate knee effusion present and I do think this is contributing to her increased and continued pain.  I did offer to aspirate her knee once again and evaluate her fluid and then possibly provide intra-articular steroid injection as this is the primary treatment modality for monoarticular crystalline arthropathy, However patient declined and as  such we will give her colchicine 1.2 mg x 1 now and then 0.6 mg an hour after first dose.  We will also start Toradol 15 mg IV every 6 hours for 4 doses.  Ice left knee every 2 hours as well   Okay to start mobilizing with therapy   Follow-up on cultures   Would expect elevated sed rate, CRP and serum WBC count with acute inflammatory and crystalline arthropathy particularly in an elderly patient.   Have discussed with Dr. Alvan Dame and he will follow-up with the patient as well  - Pain management:  As above  Scheduled Tylenol as well  If she responds to Toradol could consider additional short course of anti-inflammatories  -  ID:   On Vanc and Rocephin  - Dispo:  Follow up on body fluid cultures     Jari Pigg, PA-C [phone] (C) 07/06/2020, 10:26 AM  [facility] [place] [id number] [phone] Jenetta [phone] (F)    After 5pm and on the weekends please log on to Amion, go to orthopaedics and the look under the [facility] Call for the provider(s) on call. You can also call our office at [phone] and then follow the prompts to be connected to the call team.

## 2020-07-06 NOTE — Progress Notes (Addendum)
PROGRESS NOTE    Alexis Olson  ZOX:[id number] DOB: [birth year] DOA: 07/05/2020 PCP: Mosie Lukes, MD   Chief Complain: Left knee pain/swelling  Brief Narrative: Patient is 87 year old female with history of chronic pain syndrome, anxiety, asthma, history of provoked left leg DVT on Eliquis who presented for the evaluation of left knee pain and swelling.  Pain and swelling have progressively worsened and she was unable to bear weight.  She was also having subjective fever/chills.  On presentation she was febrile, tachycardic.  Labs showed leukocytosis.  Suspected to have septic arthritis of the left knee, left knee was aspirated and was sent for Gram stain and culture.  Orthopedics consulted.  Assessment & Plan:   Principal Problem:   Septic arthritis ([facility]) Active Problems:   Anxiety state   Essential hypertension   Asthma   Hypokalemia   Hyponatremia   DVT (deep venous thrombosis) (HCC)   Left knee septic arthritis: Has history of chronic left knee pain.  Presented with increased pain, swelling, subjective fevers/chills.  Lab work showed leukocytosis, she was tachycardic on presentation.  Clinical presentation suspicious for septic arthritis of left knee.  Left knee aspirated in the emergency department, will follow up report,gram stain did not show any organisms.  Orthopedics consulted.  Continue broad-spectrum antibiotics.Elevated CRP. Will follow with orthopedics recommendation for further steps.  Since Gram stain did not show any organism, arthritis could be inflammatory.  Also given colchicine  Severe Sepsis: Presented with leukocytosis, fever, chills.  Source is left knee.  Continue broad spectrum antibiotics, follow cultures  History of DVT: History of provoked left lower extremity DVT in August 2021.  On Eliquis at home which is on hold.  Hyponatremia/hypokalemia: Continue to monitor and supplement.  Continue gentle IV fluids  History of asthma: Not in exacerbation.  On  Singulair, bronchodilators  Chronic pain syndrome: Gabapentin, amitriptyline, as needed oxycodone at home  History of anxiety/insomnia: On Xanax and Ambien as needed  Debility/deconditioning: We will consult PT/OT .         DVT prophylaxis: Eliquis Code Status: Full Family Communication: Called and discussed with husband on phone on 07/06/20 Status is: Inpatient  Remains inpatient appropriate because:Inpatient level of care appropriate due to severity of illness   Dispo: The patient is from: Home              Anticipated d/c is to: Home              Patient currently is not medically stable to d/c.   Difficult to place patient No    Consultants: Ortho  Procedures:None yet  Antimicrobials:  Anti-infectives (From admission, onward)   Start     Dose/Rate Route Frequency Ordered Stop   07/06/20 2200  vancomycin (VANCOREADY) IVPB 1000 mg/200 mL        1,000 mg 200 mL/hr over 60 Minutes Intravenous Every 24 hours 07/06/20 0005     07/05/20 2030  vancomycin (VANCOREADY) IVPB 1500 mg/300 mL        1,500 mg 150 mL/hr over 120 Minutes Intravenous  Once 07/05/20 2029 07/06/20 0025   07/05/20 2030  cefTRIAXone (ROCEPHIN) 1 g in sodium chloride 0.9 % 100 mL IVPB        1 g 200 mL/hr over 30 Minutes Intravenous  Once 07/05/20 2029 07/05/20 2201      Subjective:  Seen and examined the bedside this morning.  She had mild grade fever during my evaluation, she was also noted to be tachycardic.  Complains of pain on the left knee.  Objective: Vitals:   07/06/20 0030 07/06/20 0446 07/06/20 0733 07/06/20 0759  BP: (!) 106/57 117/76 140/63   Pulse: (!) 51 67 (!) 55   Resp: 16 17 18    Temp: 99.9 F (37.7 C) 98.5 F (36.9 C) 100.2 F (37.9 C)   TempSrc: Oral Oral Oral   SpO2: 95% 92% 96% 92%  Weight:        Intake/Output Summary (Last 24 hours) at 07/06/2020 0817 Last data filed at 07/06/2020 0700 Gross per 24 hour  Intake 1041.02 ml  Output --  Net 1041.02 ml   Filed  Weights   07/05/20 2000  Weight: 77.1 kg    Examination:  General exam: Overall comfortable, not in distress, pleasant elderly female HEENT: PERRL Respiratory system:  no wheezes or crackles  Cardiovascular system: S1 & S2 heard, RRR.  Gastrointestinal system: Abdomen is nondistended, soft and nontender. Central nervous system: Alert and oriented Extremities: Noo clubbing ,no cyanosis, increased warmth/tenderness/edema of the left knee Skin: No rashes, no ulcers,no icterus      Data Reviewed: I have personally reviewed following labs and imaging studies  CBC: Recent Labs  Lab 07/05/20 1811 07/06/20 0033  WBC 14.7* 13.5*  HGB 12.1 11.4*  HCT 38.1 36.1  MCV 98.2 98.6  PLT 241 993   Basic Metabolic Panel: Recent Labs  Lab 07/05/20 1811 07/06/20 0033  NA 132* 133*  K 3.4* 3.7  CL 100 102  CO2 25 23  GLUCOSE 106* 112*  BUN 11 10  CREATININE 0.63 0.63  CALCIUM 9.1 8.8*  MG  --  1.8   GFR: Estimated Creatinine Clearance: 52.8 mL/min (by C-G formula based on SCr of 0.63 mg/dL). Liver Function Tests: Recent Labs  Lab 07/05/20 1811  AST 19  ALT 20  ALKPHOS 57  BILITOT 0.6  PROT 7.2  ALBUMIN 3.4*   No results for input(s): LIPASE, AMYLASE in the last 168 hours. No results for input(s): AMMONIA in the last 168 hours. Coagulation Profile: No results for input(s): INR, PROTIME in the last 168 hours. Cardiac Enzymes: No results for input(s): CKTOTAL, CKMB, CKMBINDEX, TROPONINI in the last 168 hours. BNP (last 3 results) No results for input(s): PROBNP in the last 8760 hours. HbA1C: No results for input(s): HGBA1C in the last 72 hours. CBG: No results for input(s): GLUCAP in the last 168 hours. Lipid Profile: No results for input(s): CHOL, HDL, LDLCALC, TRIG, CHOLHDL, LDLDIRECT in the last 72 hours. Thyroid Function Tests: No results for input(s): TSH, T4TOTAL, FREET4, T3FREE, THYROIDAB in the last 72 hours. Anemia Panel: No results for input(s):  VITAMINB12, FOLATE, FERRITIN, TIBC, IRON, RETICCTPCT in the last 72 hours. Sepsis Labs: Recent Labs  Lab 07/05/20 1811  LATICACIDVEN 1.1    Recent Results (from the past 240 hour(s))  Body fluid culture w Gram Stain     Status: None (Preliminary result)   Collection Time: 07/05/20  8:04 PM   Specimen: Synovium  Result Value Ref Range Status   Specimen Description SYNOVIAL  Final   Special Requests LEFT KNEE  Final   Gram Stain   Final    ABUNDANT WBC PRESENT, PREDOMINANTLY PMN NO ORGANISMS SEEN Performed at [facility], [address] [place], [place] [address]    Culture PENDING  Incomplete   Report Status PENDING  Incomplete  SARS CORONAVIRUS 2 (TAT 6-24 HRS) Nasopharyngeal Nasopharyngeal Swab     Status: None   Collection Time: 07/05/20 10:38 PM  Specimen: Nasopharyngeal Swab  Result Value Ref Range Status   SARS Coronavirus 2 NEGATIVE NEGATIVE Final    Comment: (NOTE) SARS-CoV-2 target nucleic acids are NOT DETECTED.  The SARS-CoV-2 RNA is generally detectable in upper and lower respiratory specimens during the acute phase of infection. Negative results do not preclude SARS-CoV-2 infection, do not rule out co-infections with other pathogens, and should not be used as the sole basis for treatment or other patient management decisions. Negative results must be combined with clinical observations, patient history, and epidemiological information. The expected result is Negative.  Fact Sheet for Patients: SugarRoll.be  Fact Sheet for Healthcare Providers: https://www.woods-mathews.com/  This test is not yet approved or cleared by the [place] FDA and  has been authorized for detection and/or diagnosis of SARS-CoV-2 by FDA under an Emergency Use Authorization (EUA). This EUA will remain  in effect (meaning this test can be used) for the duration of the COVID-19 declaration under Se ction 564(b)(1) of the Act, 21  U.S.C. section 360bbb-3(b)(1), unless the authorization is terminated or revoked sooner.  Performed at [facility], [place] [address] [place], [place] [address]   Surgical pcr screen     Status: None   Collection Time: 07/06/20  1:45 AM   Specimen: Nasal Mucosa; Nasal Swab  Result Value Ref Range Status   MRSA, PCR NEGATIVE NEGATIVE Final   Staphylococcus aureus NEGATIVE NEGATIVE Final    Comment: (NOTE) The Xpert SA Assay (FDA approved for NASAL specimens in patients 87 years of age and older), is one component of a comprehensive surveillance program. It is not intended to diagnose infection nor to guide or monitor treatment. Performed at [facility], [place] [address] [place], [place] [address]          Radiology Studies: DG Knee Complete 4 Views Left  Result Date: 07/05/2020 CLINICAL DATA:  Left-sided knee pain EXAM: LEFT KNEE - COMPLETE 4+ VIEW COMPARISON:  None. FINDINGS: No fracture or malalignment. Advanced tricompartment arthritis. Joint space calcifications. Large suprapatellar knee effusion. Vascular calcifications. IMPRESSION: 1. 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 2. Chondrocalcinosis. No acute osseous abnormality. Electronically Signed   By: Donavan Foil M.D.   On: 07/05/2020 18:48        Scheduled Meds: . acidophilus  1 capsule Oral Daily  . amitriptyline  100 mg Oral QHS  . gabapentin  300 mg Oral QHS  . mometasone-formoterol  2 puff Inhalation BID  . pantoprazole  40 mg Oral Daily  . potassium chloride SA  20 mEq Oral Daily  . tamsulosin  0.4 mg Oral QPC supper   Continuous Infusions: . vancomycin       LOS: 1 day    Time spent: 35 mins.More than 50% of that time was spent in counseling and/or coordination of care.      Shelly Coss, MD [facility] P4/02/2021, 8:17 AM

## 2020-07-06 NOTE — Evaluation (Signed)
Physical Therapy Evaluation Patient Details Name: Alexis Olson MRN: [id number] DOB: April 28, 1933 Today's Date: 07/06/2020   History of Present Illness  Pt is an 87 y/o female presenting with increased L knee pain. Per notes septic arthritis vs pseudogout likely. PMH includes R TKA, HTN, DVT, and asthma.  Clinical Impression  Pt admitted secondary to problem above with deficits below. Pt requiring mod to max A to stand and transfer to/from [facility] this session. Decreased ability to weightbear on LLE secondary to pain. Given current deficits, feel pt would benefit from SNF level therapies, however, pt may refuse. Will continue to follow acutely.     Follow Up Recommendations SNF;Supervision for mobility/OOB    Equipment Recommendations  Wheelchair (measurements PT);Wheelchair cushion (measurements PT);Hospital bed    Recommendations for Other Services       Precautions / Restrictions Precautions Precautions: Fall Restrictions Weight Bearing Restrictions: No      Mobility  Bed Mobility Overal bed mobility: Needs Assistance Bed Mobility: Supine to Sit;Sit to Supine     Supine to sit: Mod assist Sit to supine: Mod assist   General bed mobility comments: Mod A for LE and trunk assist. Also required assist for scooting hips to EOB.    Transfers Overall transfer level: Needs assistance Equipment used: Rolling walker (2 wheeled) Transfers: Sit to/from [facility] Sit to Stand: Max assist Stand pivot transfers: Mod assist       General transfer comment: Max A for lift assist and steadying to stand. Mod A for steadying to transfer to/from [facility]. Pt with difficulty bearing weight on LLE and performing pivotal steps on RLE.  Ambulation/Gait                Stairs            Wheelchair Mobility    Modified Rankin (Stroke Patients Only)       Balance Overall balance assessment: Needs assistance Sitting-balance support: No upper extremity  supported;Feet supported Sitting balance-Leahy Scale: Fair     Standing balance support: Bilateral upper extremity supported;During functional activity Standing balance-Leahy Scale: Poor Standing balance comment: Reliant on BUE support and external support                             Pertinent Vitals/Pain Pain Assessment: Faces Faces Pain Scale: Hurts whole lot Pain Location: L knee Pain Descriptors / Indicators: Grimacing;Guarding Pain Intervention(s): Monitored during session;Limited activity within patient's tolerance;Repositioned    Home Living Family/patient expects to be discharged to:: Private residence Living Arrangements: Spouse/significant other Available Help at Discharge: Family Type of Home: House Home Access: Stairs to enter Entrance Stairs-Rails: Left Entrance Stairs-Number of Steps: 4-5 Home Layout: One level Home Equipment: Grab bars - tub/shower;Bedside commode;Walker - 2 wheels      Prior Function Level of Independence: Independent with assistive device(s)         Comments: Was using RW for mobility     Hand Dominance        Extremity/Trunk Assessment   Upper Extremity Assessment Upper Extremity Assessment: Defer to OT evaluation    Lower Extremity Assessment Lower Extremity Assessment: LLE deficits/detail LLE Deficits / Details: Increased swelling in L knee. Difficulty with weightbearing. Limited knee flexion secondary to pain.    Cervical / Trunk Assessment Cervical / Trunk Assessment: Kyphotic  Communication   Communication: No difficulties  Cognition Arousal/Alertness: Awake/alert Behavior During Therapy: WFL for tasks assessed/performed Overall Cognitive Status: Impaired/Different from baseline Area  of Impairment: Safety/judgement                         Safety/Judgement: Decreased awareness of deficits;Decreased awareness of safety            General Comments General comments (skin integrity, edema,  etc.): Pt's husband present during session    Exercises     Assessment/Plan    PT Assessment Patient needs continued PT services  PT Problem List Decreased strength;Decreased range of motion;Decreased activity tolerance;Decreased balance;Decreased mobility;Decreased knowledge of use of DME;Decreased knowledge of precautions;Pain       PT Treatment Interventions DME instruction;Gait training;Functional mobility training;Therapeutic activities;Therapeutic exercise;Stair training;Balance training;Patient/family education    PT Goals (Current goals can be found in the Care Plan section)  Acute Rehab PT Goals Patient Stated Goal: to go home PT Goal Formulation: With patient Time For Goal Achievement: 07/20/20 Potential to Achieve Goals: Good    Frequency Min 3X/week   Barriers to discharge        Co-evaluation               AM-PAC PT "6 Clicks" Mobility  Outcome Measure Help needed turning from your back to your side while in a flat bed without using bedrails?: A Little Help needed moving from lying on your back to sitting on the side of a flat bed without using bedrails?: A Lot Help needed moving to and from a bed to a chair (including a wheelchair)?: A Lot Help needed standing up from a chair using your arms (e.g., wheelchair or bedside chair)?: A Lot Help needed to walk in hospital room?: Total Help needed climbing 3-5 steps with a railing? : Total 6 Click Score: 11    End of Session Equipment Utilized During Treatment: Gait belt Activity Tolerance: Patient limited by pain Patient left: in bed;with call bell/phone within reach;with bed alarm set;with family/visitor present Nurse Communication: Mobility status PT Visit Diagnosis: Unsteadiness on feet (R26.81);Muscle weakness (generalized) (M62.81);Pain;Difficulty in walking, not elsewhere classified (R26.2) Pain - Right/Left: Left Pain - part of body: Knee    Time: 1441-1520 PT Time Calculation (min) (ACUTE ONLY):  39 min   Charges:   PT Evaluation $PT Eval Moderate Complexity: 1 Mod PT Treatments $Therapeutic Activity: 23-37 mins        Lou Miner, DPT  Acute Rehabilitation Services  Pager: [phone] Office: [phone]   Rudean Hitt 07/06/2020, 4:40 PM

## 2020-07-06 NOTE — Progress Notes (Signed)
Pharmacy Antibiotic Note  Alexis Olson is a 87 y.o. female admitted on 07/05/2020 with left knee septic arthritis.  Pharmacy has been consulted for Vancomycin dosing. WBC is elevated. Renal function age appropriate. Knee aspiration in ED with elevated white count.   Plan: Vancomycin 1500 mg IV x 1, then 1000 mg IV q24h >>Estimated AUC: 422 Trend WBC, temp, renal function  F/U infectious work-up Drug levels as indicated  Weight: 77.1 kg (170 lb)  Temp (24hrs), Avg:100.4 F (38 C), Min:100.4 F (38 C), Max:100.4 F (38 C)  Recent Labs  Lab 07/05/20 1811  WBC 14.7*  CREATININE 0.63  LATICACIDVEN 1.1    Estimated Creatinine Clearance: 52.8 mL/min (by C-G formula based on SCr of 0.63 mg/dL).    Allergies  Allergen Reactions  . Ativan [Lorazepam] Anaphylaxis and Other (See Comments)    Deathly allergic; "resp rate dropped dropped to 6"  . Other Anaphylaxis    Red peppers--choking, also  . Azithromycin Diarrhea  . Benadryl [Diphenhydramine Hcl] Other (See Comments)    Paradoxical response  . Ciprofloxacin Hcl Other (See Comments)    Causes yeast infection and refuses to take  . Codeine Phosphate Nausea Only    Can tolerate in limited amounts  . Hydrocodone Itching and Nausea And Vomiting  . Hydrocodone-Homatropine Itching and Nausea And Vomiting  . Levaquin [Levofloxacin In D5w] Other (See Comments)    Muscle soreness  . Levofloxacin   . Meperidine Hcl Other (See Comments)    Reaction ??  . Molds & Smuts Other (See Comments)    unsure  . Sulfa Antibiotics Other (See Comments)    Joint pain  . Tape Other (See Comments)    No Coban wrap (per the patient)  . Tramadol   . Ultram [Tramadol Hcl] Other (See Comments)    "Can't move joints"   Narda Bonds, PharmD, [place] Pharmacist Phone: [phone]

## 2020-07-06 NOTE — Plan of Care (Signed)

## 2020-07-07 ENCOUNTER — Other Ambulatory Visit (HOSPITAL_COMMUNITY): Payer: Self-pay

## 2020-07-07 DIAGNOSIS — M009 Pyogenic arthritis, unspecified: Secondary | ICD-10-CM | POA: Diagnosis not present

## 2020-07-07 LAB — BASIC METABOLIC PANEL
Anion gap: 7 (ref 5–15)
BUN: 18 mg/dL (ref 8–23)
CO2: 19 mmol/L — ABNORMAL LOW (ref 22–32)
Calcium: 8.2 mg/dL — ABNORMAL LOW (ref 8.9–10.3)
Chloride: 107 mmol/L (ref 98–111)
Creatinine, Ser: 0.63 mg/dL (ref 0.44–1.00)
GFR, Estimated: >60 mL/min (ref >60)
Glucose, Bld: 100 mg/dL — ABNORMAL HIGH (ref 70–99)
Potassium: 3.4 mmol/L — ABNORMAL LOW (ref 3.5–5.1)
Sodium: 133 mmol/L — ABNORMAL LOW (ref 135–145)

## 2020-07-07 LAB — CBC
HCT: 28.9 % — ABNORMAL LOW (ref 36.0–46.0)
Hemoglobin: 9.1 g/dL — ABNORMAL LOW (ref 12.0–15.0)
MCH: 31.5 pg (ref 26.0–34.0)
MCHC: 31.5 g/dL (ref 30.0–36.0)
MCV: 100 fL (ref 80.0–100.0)
Platelets: 181 10*3/uL (ref 150–400)
RBC: 2.89 MIL/uL — ABNORMAL LOW (ref 3.87–5.11)
RDW: 14.5 % (ref 11.5–15.5)
WBC: 9.4 10*3/uL (ref 4.0–10.5)
nRBC: 0 % (ref 0.0–0.2)

## 2020-07-07 MED ORDER — PREDNISONE 20 MG PO TABS: 20.0000 mg | ORAL_TABLET | Freq: Every day | ORAL | Status: DC

## 2020-07-07 MED ORDER — PREDNISONE 5 MG PO TABS: 5.0000 mg | ORAL_TABLET | Freq: Every day | ORAL | Status: DC

## 2020-07-07 MED ORDER — PREDNISONE 20 MG PO TABS
40.0000 mg | ORAL_TABLET | Freq: Every day | ORAL | Status: DC
  Filled 2020-07-07: qty 2

## 2020-07-07 MED ORDER — APIXABAN 5 MG PO TABS
5.0000 mg | ORAL_TABLET | Freq: Two times a day (BID) | ORAL | Status: DC
  Administered 2020-07-07 – 2020-07-10 (×7): 5 mg via ORAL
  Filled 2020-07-07 (×7): qty 1

## 2020-07-07 MED ORDER — DIPHENOXYLATE-ATROPINE 2.5-0.025 MG PO TABS
1.0000 | ORAL_TABLET | Freq: Three times a day (TID) | ORAL | Status: DC | PRN
  Filled 2020-07-07 (×2): qty 1

## 2020-07-07 MED ORDER — PREDNISONE 20 MG PO TABS
40.0000 mg | ORAL_TABLET | Freq: Every day | ORAL | Status: AC
  Administered 2020-07-08 – 2020-07-10 (×3): 40 mg via ORAL
  Filled 2020-07-07 (×3): qty 2

## 2020-07-07 MED ORDER — PREDNISONE 10 MG PO TABS: 10.0000 mg | ORAL_TABLET | Freq: Every day | ORAL | Status: DC

## 2020-07-07 MED ORDER — DIPHENOXYLATE-ATROPINE 2.5-0.025 MG PO TABS
1.0000 | ORAL_TABLET | Freq: Three times a day (TID) | ORAL | Status: DC | PRN
  Administered 2020-07-07 – 2020-07-08 (×3): 2 via ORAL
  Filled 2020-07-07 (×2): qty 2

## 2020-07-07 MED ORDER — SODIUM CHLORIDE 0.9 % IV SOLN: INTRAVENOUS | Status: AC

## 2020-07-07 MED ORDER — POTASSIUM CHLORIDE CRYS ER 20 MEQ PO TBCR
40.0000 meq | EXTENDED_RELEASE_TABLET | Freq: Once | ORAL | Status: AC
  Administered 2020-07-07: 40 meq via ORAL
  Filled 2020-07-07: qty 2

## 2020-07-07 NOTE — Evaluation (Signed)
Occupational Therapy Evaluation Patient Details Name: Alexis Olson MRN: [id number] DOB: 12/13/1933 Today's Date: 07/07/2020    History of Present Illness Pt is an 87 y/o female presenting with increased L knee pain. Per notes septic arthritis vs pseudogout likely. PMH includes R TKA, HTN, DVT, and asthma.   Clinical Impression   Pt presents with decline in function and safety with ADLs and ADL mobility with impaired strength, balance and endurance. PTA pt lived at home with her husband and was Ind with ADLs/selfcare and used a cane for mobility. Pt currently requires assist to sit EOB, , mod A +2 for SPT to BSC, max - total A for LB selfcare, total A for toileting. Pt would benefit from acute OT services to address impairments to maximize level of function and safety    Follow Up Recommendations  SNF;Other (comment) (pt refusing SNF, will need HH if goes home)    Equipment Recommendations  Wheelchair (measurements OT);Wheelchair cushion (measurements OT);Other (comment) (hospital bed, RW)    Recommendations for Other Services       Precautions / Restrictions Precautions Precautions: Fall Restrictions Weight Bearing Restrictions: No      Mobility Bed Mobility Overal bed mobility: Needs Assistance Bed Mobility: Supine to Sit;Sit to Supine     Supine to sit: Mod assist Sit to supine: Mod assist   General bed mobility comments: Mod A for LE and trunk assist. Also required assist for scooting hips to EOB. Increased time and effort required    Transfers Overall transfer level: Needs assistance Equipment used: Rolling walker (2 wheeled) Transfers: Sit to/from [facility] Sit to Stand: Mod assist;+2 physical assistance Stand pivot transfers: Mod assist       General transfer comment: Mod A + 2 for lift assist and steadying to stand. Mod A for steadying to transfer to/from [facility].    Balance Overall balance assessment: Needs assistance Sitting-balance  support: No upper extremity supported;Feet supported Sitting balance-Leahy Scale: Fair     Standing balance support: Bilateral upper extremity supported;During functional activity Standing balance-Leahy Scale: Poor                             ADL either performed or assessed with clinical judgement   ADL Overall ADL's : Needs assistance/impaired Eating/Feeding: Independent;Sitting   Grooming: Wash/dry hands;Wash/dry face;Min guard;Sitting   Upper Body Bathing: Min guard;Sitting   Lower Body Bathing: Maximal assistance   Upper Body Dressing : Min guard;Sitting   Lower Body Dressing: Total assistance   Toilet Transfer: Moderate assistance;+2 for safety/equipment;RW;Stand-pivot;Cueing for safety   Toileting- Clothing Manipulation and Hygiene: Total assistance       Functional mobility during ADLs: Moderate assistance;+2 for physical assistance;Rolling walker;Cueing for safety       Vision Baseline Vision/History: Wears glasses Wears Glasses: Distance only Patient Visual Report: No change from baseline       Perception     Praxis      Pertinent Vitals/Pain Pain Assessment: Faces Faces Pain Scale: Hurts even more Pain Location: L knee Pain Descriptors / Indicators: Grimacing;Guarding Pain Intervention(s): Limited activity within patient's tolerance;Monitored during session;Premedicated before session;Repositioned     Hand Dominance Right   Extremity/Trunk Assessment Upper Extremity Assessment Upper Extremity Assessment: Generalized weakness   Lower Extremity Assessment Lower Extremity Assessment: Defer to PT evaluation   Cervical / Trunk Assessment Cervical / Trunk Assessment: Kyphotic   Communication Communication Communication: No difficulties   Cognition Arousal/Alertness: Awake/alert Behavior During Therapy:  WFL for tasks assessed/performed Overall Cognitive Status: Impaired/Different from baseline Area of Impairment: Safety/judgement                          Safety/Judgement: Decreased awareness of deficits;Decreased awareness of safety         General Comments       Exercises     Shoulder Instructions      Home Living Family/patient expects to be discharged to:: Private residence Living Arrangements: Spouse/significant other Available Help at Discharge: Family Type of Home: House Home Access: Stairs to enter Technical brewer of Steps: 4-5 Entrance Stairs-Rails: Left Home Layout: One level     Bathroom Shower/Tub: Occupational psychologist: Handicapped height     Home Equipment: [place];Bedside commode;Walker - 2 wheels;Cane - single point          Prior Functioning/Environment Level of Independence: Independent with assistive device(s)        Comments: Ind with ADLs/selfcare, used cane for mobility        OT Problem List: Decreased strength;Pain;Decreased safety awareness;Decreased activity tolerance;Decreased knowledge of use of DME or AE      OT Treatment/Interventions: Self-care/ADL training;DME and/or AE instruction;Therapeutic activities;Balance training;Patient/family education    OT Goals(Current goals can be found in the care plan section) Acute Rehab OT Goals Patient Stated Goal: go home OT Goal Formulation: With patient Time For Goal Achievement: 07/21/20 Potential to Achieve Goals: Good ADL Goals Pt Will Perform Grooming: with supervision;with set-up;with modified independence;sitting;with caregiver independent in assisting Pt Will Perform Upper Body Bathing: with supervision;with set-up;with modified independence;sitting;with caregiver independent in assisting Pt Will Perform Lower Body Bathing: with mod assist;with min assist;sitting/lateral leans;with adaptive equipment;with caregiver independent in assisting Pt Will Perform Upper Body Dressing: with supervision;with set-up;with modified independence;sitting;with caregiver independent in  assisting Pt Will Transfer to Toilet: with mod assist;with min assist;stand pivot transfer;ambulating;bedside commode Pt Will Perform Toileting - Clothing Manipulation and hygiene: with max assist;with mod assist;with caregiver independent in assisting  OT Frequency: Min 2X/week   Barriers to D/C:            Co-evaluation              AM-PAC OT "6 Clicks" Daily Activity     Outcome Measure Help from another person eating meals?: None Help from another person taking care of personal grooming?: A Little Help from another person toileting, which includes using toliet, bedpan, or urinal?: Total Help from another person bathing (including washing, rinsing, drying)?: A Lot Help from another person to put on and taking off regular upper body clothing?: A Little Help from another person to put on and taking off regular lower body clothing?: Total 6 Click Score: 14   End of Session Equipment Utilized During Treatment: Gait belt;Rolling walker;Other (comment) (BSC)  Activity Tolerance: Patient limited by pain Patient left: in bed;with call bell/phone within reach;with chair alarm set  OT Visit Diagnosis: Unsteadiness on feet (R26.81);Other abnormalities of gait and mobility (R26.89);Muscle weakness (generalized) (M62.81);Pain Pain - Right/Left: Left Pain - part of body: Knee                Time: 1610-9604 OT Time Calculation (min): 39 min Charges:  OT General Charges $OT Visit: 1 Visit OT Evaluation $OT Eval Moderate Complexity: 1 Mod OT Treatments $Self Care/Home Management : 8-22 mins $Therapeutic Activity: 8-22 mins    Britt Bottom 07/07/2020, 1:20 PM

## 2020-07-07 NOTE — Progress Notes (Signed)
Patient ID: Alexis Olson, female   DOB: 09-Jul-1933, 87 y.o.   MRN: [id number]  Still with some left knee pain but noted improvement with Toradol  Left knee severe OA with recent exacerbation Aspiration with 46,000 WBCs (a number not concerning for infection sometimes associated with inflammatory process), cultures negative thus far  Stop antibiotics  After Toradol course consider prednisone 5 mg 12 day taper followed by PO NSAIDs.  Can follow up with me, Alexis Olson,  At [facility] in 2 -3 weeks from discharge  PT here to help with ambulation and discharge needs.  She has been performing outpt PT at our office recovering from right TKR

## 2020-07-07 NOTE — Progress Notes (Addendum)
PROGRESS NOTE    Alexis Olson  WUJ:[id number] DOB: May 20, 1933 DOA: 07/05/2020 PCP: Mosie Lukes, MD   Chief Complain: Left knee pain/swelling  Brief Narrative: Patient is 87 year old female with history of chronic pain syndrome, anxiety, asthma, history of provoked left leg DVT on Eliquis who presented for the evaluation of left knee pain and swelling.  Pain and swelling have progressively worsened and she was unable to bear weight.  She was also having subjective fever/chills.  On presentation she was febrile, tachycardic.  Labs showed leukocytosis.  Suspected to have septic arthritis of the left knee initially , left knee was aspirated and was sent for Gram stain and culture which did not show any organism.  Synovial fluid showed WBC of 49,000.  Orthopedics thinks that this is associated with severe osteoarthritis.  Started on tapering steroid.  PT/OT recommended skilled nursing facility but she wants to go home with home health.  Waiting for few more PT/OT sessions and adequate pain management before discharging to home with home health  Assessment & Plan:   Principal Problem:   Septic arthritis ([place]) Active Problems:   Anxiety state   Essential hypertension   Asthma   Hypokalemia   Hyponatremia   DVT (deep venous thrombosis) (HCC)   Left knee  arthritis: Has history of chronic left knee pain.  Presented with increased pain, swelling, subjective fevers/chills.  Lab work showed leukocytosis, she was tachycardic on presentation.  Clinical presentation suspicious for septic arthritis of left knee initilally.  Left knee aspirated in the emergency department. Orthopedics consulted.Elevated CRP. Synovial fluid Gram stain does not show any organism, culture is still pending,NGTD.  Synovial fluid WBC count is 49,000.  Orthopedics suspected this is severe osteoarthritis rather than septic. Abx d/ced Orthopedics recommended to start on tapering steroid.  She will follow-up with Dr. Alvan Dame as an  outpatient in 2 to 3 weeks.  Suspected Severe Sepsis: Presented with leukocytosis, fever, chills.   Started  broad spectrum antibiotics, cultures NGTD.    Leukocytosis resolved.  Antibiotics discontinued as per ortho recommendation. sepsis ruled out.  History of DVT: History of provoked left lower extremity DVT in August 2021.  On Eliquis at home ,continue  Hyponatremia/hypokalemia: Continue to monitor and supplement.  Hypotension: She does not look dehydrated today.  Monitor blood pressure.  Continue gentle IV fluids till tomorrow. She has chronic diarrhea and wants to take Lomotil  History of asthma: Not in exacerbation.  On Singulair, bronchodilators  Normocytic anemia: Hemoglobin dropped to the range of  9, no evidence of any blood loss.  Most likely this is dilutional from IV fluids.  Chronic pain syndrome: On Gabapentin, amitriptyline, as needed oxycodone at home  History of anxiety/insomnia: On Xanax and Ambien as needed  Debility/deconditioning:PT/OT consulted, recommended skilled nursing facility on discharge.  Patient declined skilled nursing facility and wants to go home with home health.  PT/OT recommended few more sessions before safe discharge.         DVT prophylaxis: Eliquis Code Status: Full Family Communication: Called and discussed with  daughter on phone today Status is: Inpatient  Remains inpatient appropriate because:Inpatient level of care appropriate due to severity of illness   Dispo: The patient is from: Home              Anticipated d/c is to: Home              Patient currently is not medically stable to d/c.   Difficult to place patient No  Consultants: Ortho  Procedures:None yet  Antimicrobials:  Anti-infectives (From admission, onward)   Start     Dose/Rate Route Frequency Ordered Stop   07/06/20 2200  vancomycin (VANCOREADY) IVPB 1000 mg/200 mL        1,000 mg 200 mL/hr over 60 Minutes Intravenous Every 24 hours 07/06/20 0005      07/06/20 0930  cefTRIAXone (ROCEPHIN) 2 g in sodium chloride 0.9 % 100 mL IVPB        2 g 200 mL/hr over 30 Minutes Intravenous Every 24 hours 07/06/20 0822     07/05/20 2030  vancomycin (VANCOREADY) IVPB 1500 mg/300 mL        1,500 mg 150 mL/hr over 120 Minutes Intravenous  Once 07/05/20 2029 07/06/20 0025   07/05/20 2030  cefTRIAXone (ROCEPHIN) 1 g in sodium chloride 0.9 % 100 mL IVPB        1 g 200 mL/hr over 30 Minutes Intravenous  Once 07/05/20 2029 07/05/20 2201      Subjective:  Patient seen and examined the bedside this morning.  Hemodynamically stable during my evaluation.  She looks much better today.  Pain better.  Left knee swelling and warmth has improved.  She was applying ice.  Objective: Vitals:   07/06/20 1958 07/06/20 2021 07/06/20 2153 07/07/20 0501  BP: (!) 106/52   112/60  Pulse: 95   94  Resp: 16   16  Temp: 98.6 F (37 C)   99.1 F (37.3 C)  TempSrc: Oral   Oral  SpO2: 96% 97%  100%  Weight:   81 kg   Height:   5\' 5"  (1.651 m)     Intake/Output Summary (Last 24 hours) at 07/07/2020 0805 Last data filed at 07/07/2020 0555 Gross per 24 hour  Intake 1336.96 ml  Output 500 ml  Net 836.96 ml   Filed Weights   07/05/20 2000 07/06/20 2153  Weight: 77.1 kg 81 kg    Examination:  General exam: Overall comfortable, not in distress, pleasant elderly female HEENT: PERRL Respiratory system:  no wheezes or crackles  Cardiovascular system: S1 & S2 heard, RRR.  Gastrointestinal system: Abdomen is nondistended, soft and nontender. Central nervous system: Alert and oriented Extremities: No edema, no clubbing ,no cyanosis, swelling of left knee Skin: No rashes, no ulcers,no icterus   Data Reviewed: I have personally reviewed following labs and imaging studies  CBC: Recent Labs  Lab 07/05/20 1811 07/06/20 0033 07/07/20 0206  WBC 14.7* 13.5* 9.4  HGB 12.1 11.4* 9.1*  HCT 38.1 36.1 28.9*  MCV 98.2 98.6 100.0  PLT 241 226 209   Basic Metabolic  Panel: Recent Labs  Lab 07/05/20 1811 07/06/20 0033 07/07/20 0206  NA 132* 133* 133*  K 3.4* 3.7 3.4*  CL 100 102 107  CO2 25 23 19*  GLUCOSE 106* 112* 100*  BUN 11 10 18   CREATININE 0.63 0.63 0.63  CALCIUM 9.1 8.8* 8.2*  MG  --  1.8  --    GFR: Estimated Creatinine Clearance: 54.1 mL/min (by C-G formula based on SCr of 0.63 mg/dL). Liver Function Tests: Recent Labs  Lab 07/05/20 1811  AST 19  ALT 20  ALKPHOS 57  BILITOT 0.6  PROT 7.2  ALBUMIN 3.4*   No results for input(s): LIPASE, AMYLASE in the last 168 hours. No results for input(s): AMMONIA in the last 168 hours. Coagulation Profile: No results for input(s): INR, PROTIME in the last 168 hours. Cardiac Enzymes: No results for input(s): CKTOTAL, CKMB, CKMBINDEX, TROPONINI  in the last 168 hours. BNP (last 3 results) No results for input(s): PROBNP in the last 8760 hours. HbA1C: No results for input(s): HGBA1C in the last 72 hours. CBG: No results for input(s): GLUCAP in the last 168 hours. Lipid Profile: No results for input(s): CHOL, HDL, LDLCALC, TRIG, CHOLHDL, LDLDIRECT in the last 72 hours. Thyroid Function Tests: No results for input(s): TSH, T4TOTAL, FREET4, T3FREE, THYROIDAB in the last 72 hours. Anemia Panel: No results for input(s): VITAMINB12, FOLATE, FERRITIN, TIBC, IRON, RETICCTPCT in the last 72 hours. Sepsis Labs: Recent Labs  Lab 07/05/20 1811  LATICACIDVEN 1.1    Recent Results (from the past 240 hour(s))  Blood culture (routine x 2)     Status: None (Preliminary result)   Collection Time: 07/05/20  5:50 PM   Specimen: BLOOD  Result Value Ref Range Status   Specimen Description BLOOD RIGHT ARM  Final   Special Requests   Final    BOTTLES DRAWN AEROBIC AND ANAEROBIC Blood Culture adequate volume   Culture   Final    NO GROWTH < 12 HOURS Performed at [facility], [place] [address], [place], [place] [address]    Report Status PENDING  Incomplete  Blood culture (routine x 2)      Status: None (Preliminary result)   Collection Time: 07/05/20  6:13 PM   Specimen: BLOOD  Result Value Ref Range Status   Specimen Description BLOOD SITE NOT SPECIFIED  Final   Special Requests   Final    BOTTLES DRAWN AEROBIC AND ANAEROBIC Blood Culture adequate volume   Culture   Final    NO GROWTH < 12 HOURS Performed at [facility], [place] [address] [place], [place] [address]    Report Status PENDING  Incomplete  Body fluid culture w Gram Stain     Status: None (Preliminary result)   Collection Time: 07/05/20  8:04 PM   Specimen: Synovium  Result Value Ref Range Status   Specimen Description SYNOVIAL  Final   Special Requests LEFT KNEE  Final   Gram Stain   Final    ABUNDANT WBC PRESENT, PREDOMINANTLY PMN NO ORGANISMS SEEN    Culture   Final    NO GROWTH < 12 HOURS Performed at [facility], [place] [address] [place],  [address]    Report Status PENDING  Incomplete  SARS CORONAVIRUS 2 (TAT 6-24 HRS) Nasopharyngeal Nasopharyngeal Swab     Status: None   Collection Time: 07/05/20 10:38 PM   Specimen: Nasopharyngeal Swab  Result Value Ref Range Status   SARS Coronavirus 2 NEGATIVE NEGATIVE Final    Comment: (NOTE) SARS-CoV-2 target nucleic acids are NOT DETECTED.  The SARS-CoV-2 RNA is generally detectable in upper and lower respiratory specimens during the acute phase of infection. Negative results do not preclude SARS-CoV-2 infection, do not rule out co-infections with other pathogens, and should not be used as the sole basis for treatment or other patient management decisions. Negative results must be combined with clinical observations, patient history, and epidemiological information. The expected result is Negative.  Fact Sheet for Patients: SugarRoll.be  Fact Sheet for Healthcare Providers: https://www.woods-mathews.com/  This test is not yet approved or cleared by the [place] FDA and  has been  authorized for detection and/or diagnosis of SARS-CoV-2 by FDA under an Emergency Use Authorization (EUA). This EUA will remain  in effect (meaning this test can be used) for the duration of the COVID-19 declaration under Se ction 564(b)(1)  of the Act, 21 U.S.C. section 360bbb-3(b)(1), unless the authorization is terminated or revoked sooner.  Performed at [facility], [place] [address] [place], [place] [address]   Surgical pcr screen     Status: None   Collection Time: 07/06/20  1:45 AM   Specimen: Nasal Mucosa; Nasal Swab  Result Value Ref Range Status   MRSA, PCR NEGATIVE NEGATIVE Final   Staphylococcus aureus NEGATIVE NEGATIVE Final    Comment: (NOTE) The Xpert SA Assay (FDA approved for NASAL specimens in patients 12 years of age and older), is one component of a comprehensive surveillance program. It is not intended to diagnose infection nor to guide or monitor treatment. Performed at [facility], [place] [address] [place], [place] [address]          Radiology Studies: DG Knee Complete 4 Views Left  Result Date: 07/05/2020 CLINICAL DATA:  Left-sided knee pain EXAM: LEFT KNEE - COMPLETE 4+ VIEW COMPARISON:  None. FINDINGS: No fracture or malalignment. Advanced tricompartment arthritis. Joint space calcifications. Large suprapatellar knee effusion. Vascular calcifications. IMPRESSION: 1. 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 2. Chondrocalcinosis. No acute osseous abnormality. Electronically Signed   By: Donavan Foil M.D.   On: 07/05/2020 18:48        Scheduled Meds: . acetaminophen  650 mg Oral Q8H   Or  . acetaminophen  650 mg Rectal Q8H  . acidophilus  1 capsule Oral Daily  . amitriptyline  100 mg Oral QHS  . gabapentin  300 mg Oral QHS  . mometasone-formoterol  2 puff Inhalation BID  . pantoprazole  40 mg Oral Daily  . potassium chloride SA  20 mEq Oral Daily  . tamsulosin  0.4 mg Oral QPC supper   Continuous Infusions: .  sodium chloride 125 mL/hr at 07/07/20 0434  . cefTRIAXone (ROCEPHIN)  IV Stopped (07/06/20 3335)  . vancomycin 1,000 mg (07/06/20 2055)     LOS: 2 days    Time spent: 35 mins.More than 50% of that time was spent in counseling and/or coordination of care.      Shelly Coss, MD [facility] P4/13/2022, 8:05 AM

## 2020-07-07 NOTE — TOC Initial Note (Addendum)
Transition of Care [facility]) - Initial/Assessment Note    Patient Details  Name: Alexis Olson MRN: [id number] Date of Birth: [birth year]  Transition of Care [facility]) CM/SW Contact:    Sharin Mons, RN Phone Number: 07/07/2020, 10:11 AM  Clinical Narrative:                 Admitted with c/o L knee pain.   Hx of R TKA, HTN, DVT, and asthma.  From home with husband. Pt states PTA independent with ADL's. Used cane intermittently. NCM spoke with @ bedside to discuss TOC needs...shared PT evaluation / recommendation :SNF;Supervision for mobility/OOB. Pt adamantly declined SNF placement . States agreeable to home health services, has used [facility] in the past and would like to use again. States has supportive family...husband and daughter to assist with care once d/c. Pt states has 4-5 steps to enter into her home.  Referral made with [facility] for home health PT/OT services pending MD orders, acceptance pending.  TOC team will continue monitor and assist with TOC needs.   07/07/2020 @  [place] accepted pt for home health services.  Expected Discharge Plan: [place] Barriers to Discharge: Continued Medical Work up   Patient Goals and CMS Choice     Choice offered to / list presented to : Patient  Expected Discharge Plan and Services Expected Discharge Plan: [place]   Discharge Planning Services: CM Consult   Living arrangements for the past 2 months: [place] Arranged: PT,OT [place] Agency: [place] Date [facility] Agency Contacted: 07/07/20 Time HH Agency Contacted: 1006 Representative spoke with at [place]: Tommi Rumps ( via text message)  Prior Living Arrangements/Services Living arrangements for the past 2 months: Single Family Home Lives with:: Spouse Patient language and need for interpreter reviewed:: Yes Do you feel safe going back to the place where you  live?: Yes      Need for Family Participation in Patient Care: Yes (Comment) Care giver support system in place?: Yes (comment) Current home services: DME (cane, walker, BSC) Criminal Activity/Legal Involvement Pertinent to Current Situation/Hospitalization: No - Comment as needed  Activities of Daily Living Home Assistive Devices/Equipment: Walker (specify type) ADL Screening (condition at time of admission) Patient's cognitive ability adequate to safely complete daily activities?: Yes Is the patient deaf or have difficulty hearing?: No Does the patient have difficulty seeing, even when wearing glasses/contacts?: No Does the patient have difficulty concentrating, remembering, or making decisions?: No Patient able to express need for assistance with ADLs?: Yes Does the patient have difficulty dressing or bathing?: No Independently performs ADLs?: Yes (appropriate for developmental age) Does the patient have difficulty walking or climbing stairs?: Yes Weakness of Legs: Left Weakness of Arms/Hands: None  Permission Sought/Granted   Permission granted to share information with : Yes, Verbal Permission Granted              Emotional Assessment Appearance:: Appears stated age     Orientation: : Oriented to Self,Oriented to Place,Oriented to  Time,Oriented to Situation Alcohol / Substance Use: Not Applicable Psych Involvement: No (comment)  Admission diagnosis:  Septic arthritis ([place]) [M00.9] Pyogenic arthritis of left knee joint, due to unspecified organism [facility]) [M00.9] Patient Active Problem List   Diagnosis Date Noted  . Septic  arthritis ([place]) 07/05/2020  . Sepsis without acute organ dysfunction ([place])   . Hyperglycemia 02/11/2020  . Gout 02/11/2020  . Left foot pain 02/11/2020  . Cellulitis 12/20/2019  . Navicular fracture of ankle 12/20/2019  . Neck pain 12/20/2019  . Diarrhea 12/01/2019  . Late effects of accidental fall 11/13/2019  . DVT (deep venous thrombosis)  ([place]) 11/13/2019  . Hypomagnesemia 11/13/2019  . Swelling of right hand 11/13/2019  . S/P TKR (total knee replacement), right 08/26/2019  . Ventral hernia without obstruction or gangrene 05/22/2018  . [place] filter in place 03/08/2017  . Pain of right lower extremity 03/08/2017  . Preventative health care 02/13/2017  . Hyponatremia 05/02/2016  . GERD (gastroesophageal reflux disease) 08/23/2015  . HSV (herpes simplex virus) infection 08/16/2015  . Hyperlipidemia, mild 08/16/2015  . Osteoporosis   . Hypokalemia   . Chronic diarrhea   . Anemia of chronic disease   . OSTEOMYELITIS, VERTEBRA 05/05/2009  . [place] DISEASE, LUMBOSACRAL SPINE 04/22/2009  . Essential hypertension 03/30/2009  . Vitamin B 12 deficiency 07/09/2008  . DIVERTICULOSIS, COLON 07/09/2008  . COLONIC POLYPS, ADENOMATOUS, HX OF 07/09/2008  . Anxiety state 08/08/2007  . INSOMNIA 08/08/2007  . Bariatric surgery status 08/08/2007  . Midline back pain 10/30/2006  . ALLERGIC RHINITIS 09/18/2006  . Asthma 09/18/2006   PCP:  Mosie Lukes, MD Pharmacy:   [place] [address] Phone: [phone] Fax: [phone]  [id number] [place], [place] [address] [place] [address] Phone: [phone] Fax: [phone]  [id number] [place], [place] [address] [place] [address] Phone: [phone] Fax: [phone]     Social Determinants of Health (SDOH) Interventions    Readmission Risk Interventions Readmission Risk Prevention Plan 11/16/2019  Transportation Screening Complete  PCP or Specialist Appt within 5-7 Days Complete  Home Care Screening Complete  Medication Review (RN CM) Complete  Some recent data might be hidden

## 2020-07-08 DIAGNOSIS — D649 Anemia, unspecified: Secondary | ICD-10-CM | POA: Diagnosis not present

## 2020-07-08 DIAGNOSIS — M1120 Other chondrocalcinosis, unspecified site: Secondary | ICD-10-CM | POA: Diagnosis not present

## 2020-07-08 DIAGNOSIS — E876 Hypokalemia: Secondary | ICD-10-CM | POA: Diagnosis not present

## 2020-07-08 DIAGNOSIS — E871 Hypo-osmolality and hyponatremia: Secondary | ICD-10-CM | POA: Diagnosis not present

## 2020-07-08 LAB — CBC WITH DIFFERENTIAL/PLATELET
Abs Immature Granulocytes: 0.03 10*3/uL (ref 0.00–0.07)
Abs Immature Granulocytes: 0.02 10*3/uL (ref 0.00–0.07)
Basophils Absolute: 0 10*3/uL (ref 0.0–0.1)
Basophils Absolute: 0 10*3/uL (ref 0.0–0.1)
Basophils Relative: 0 %
Basophils Relative: 0 %
Eosinophils Absolute: 0.3 10*3/uL (ref 0.0–0.5)
Eosinophils Absolute: 0 10*3/uL (ref 0.0–0.5)
Eosinophils Relative: 4 %
Eosinophils Relative: 1 %
HCT: 26.3 % — ABNORMAL LOW (ref 36.0–46.0)
HCT: 29.7 % — ABNORMAL LOW (ref 36.0–46.0)
Hemoglobin: 8.5 g/dL — ABNORMAL LOW (ref 12.0–15.0)
Hemoglobin: 9.3 g/dL — ABNORMAL LOW (ref 12.0–15.0)
Immature Granulocytes: 0 %
Immature Granulocytes: 0 %
Lymphocytes Relative: 25 %
Lymphocytes Relative: 9 %
Lymphs Abs: 1.8 10*3/uL (ref 0.7–4.0)
Lymphs Abs: 0.7 10*3/uL (ref 0.7–4.0)
MCH: 31.6 pg (ref 26.0–34.0)
MCH: 30.3 pg (ref 26.0–34.0)
MCHC: 32.3 g/dL (ref 30.0–36.0)
MCHC: 31.3 g/dL (ref 30.0–36.0)
MCV: 97.8 fL (ref 80.0–100.0)
MCV: 96.7 fL (ref 80.0–100.0)
Monocytes Absolute: 0.9 10*3/uL (ref 0.1–1.0)
Monocytes Absolute: 0.4 10*3/uL (ref 0.1–1.0)
Monocytes Relative: 12 %
Monocytes Relative: 6 %
Neutro Abs: 4.3 10*3/uL (ref 1.7–7.7)
Neutro Abs: 6.1 10*3/uL (ref 1.7–7.7)
Neutrophils Relative %: 59 %
Neutrophils Relative %: 84 %
Platelets: 206 10*3/uL (ref 150–400)
Platelets: 231 10*3/uL (ref 150–400)
RBC: 2.69 MIL/uL — ABNORMAL LOW (ref 3.87–5.11)
RBC: 3.07 MIL/uL — ABNORMAL LOW (ref 3.87–5.11)
RDW: 14.7 % (ref 11.5–15.5)
RDW: 14.7 % (ref 11.5–15.5)
WBC: 7.3 10*3/uL (ref 4.0–10.5)
WBC: 7.3 10*3/uL (ref 4.0–10.5)
nRBC: 0 % (ref 0.0–0.2)
nRBC: 0 % (ref 0.0–0.2)

## 2020-07-08 LAB — BASIC METABOLIC PANEL
Anion gap: 5 (ref 5–15)
BUN: 12 mg/dL (ref 8–23)
CO2: 20 mmol/L — ABNORMAL LOW (ref 22–32)
Calcium: 8.3 mg/dL — ABNORMAL LOW (ref 8.9–10.3)
Chloride: 111 mmol/L (ref 98–111)
Creatinine, Ser: 0.51 mg/dL (ref 0.44–1.00)
GFR, Estimated: >60 mL/min (ref >60)
Glucose, Bld: 98 mg/dL (ref 70–99)
Potassium: 3.2 mmol/L — ABNORMAL LOW (ref 3.5–5.1)
Sodium: 136 mmol/L (ref 135–145)

## 2020-07-08 MED ORDER — CHOLESTYRAMINE LIGHT 4 G PO PACK
4.0000 g | PACK | Freq: Two times a day (BID) | ORAL | Status: DC
  Administered 2020-07-08 – 2020-07-10 (×3): 4 g via ORAL
  Filled 2020-07-08 (×7): qty 1

## 2020-07-08 MED ORDER — DIPHENOXYLATE-ATROPINE 2.5-0.025 MG PO TABS
1.0000 | ORAL_TABLET | Freq: Four times a day (QID) | ORAL | Status: DC | PRN
  Administered 2020-07-08 – 2020-07-10 (×2): 2 via ORAL
  Filled 2020-07-08 (×2): qty 2

## 2020-07-08 MED ORDER — POTASSIUM CHLORIDE CRYS ER 20 MEQ PO TBCR
40.0000 meq | EXTENDED_RELEASE_TABLET | Freq: Once | ORAL | Status: AC
  Administered 2020-07-08: 40 meq via ORAL
  Filled 2020-07-08: qty 2

## 2020-07-08 MED ORDER — TAMSULOSIN HCL 0.4 MG PO CAPS
0.4000 mg | ORAL_CAPSULE | Freq: Every day | ORAL | Status: DC
  Administered 2020-07-08 – 2020-07-09 (×2): 0.4 mg via ORAL
  Filled 2020-07-08 (×2): qty 1

## 2020-07-08 NOTE — Progress Notes (Signed)
PROGRESS NOTE    Alexis Olson  WRU:[id number] DOB: 1933/08/21 DOA: 07/05/2020 PCP: Mosie Lukes, MD    Chief Complaint  Patient presents with  . Leg Pain    Patient reports history of septic joint in left leg.    Brief Narrative:  Patient is 87 year old female with history of chronic pain syndrome, anxiety, asthma, history of provoked left leg DVT on Eliquis who presented for the evaluation of left knee pain and swelling.   On presentation she was febrile, tachycardic.  Labs showed leukocytosis.  Suspected to have septic arthritis of the left knee initially , left knee was aspirated and was sent for Gram stain and culture which did not show any organism.  Synovial fluid showed WBC of 49,000.  Orthopedics thinks that this is associated with severe osteoarthritis.  Started on tapering steroid  PT/OT recommended skilled nursing facility but she wants to go home with home health.  Waiting for few more PT/OT sessions and adequate pain management before discharging to home with home health  Subjective:   She is sleepy, she does not open eyes during encounter, but answers question appropriately, reports had "terrible diarrhea all day yesterday" , reports tool lomotil which help, currently denies ab pain, no fever Continue to reports left knee pain, she does not want to go to snf, she wants pain better controlled here, then go home  Assessment & Plan:   Principal Problem:   Septic arthritis ([place]) Active Problems:   Anxiety state   Essential hypertension   Asthma   Hypokalemia   Hyponatremia   DVT (deep venous thrombosis) (HCC)   Left knee arthritis -With history of chronic left knee pain,  - Presented with increased pain, swelling, subjective fevers/chills.  Lab work showed leukocytosis, elevated CRP ,she was tachycardic on presentation.  Clinical presentation suspicious for septic arthritis of left knee initilally, she received vanc and Rocephin initially, Left knee aspirated in  the emergency department.  -Synovial fluid Gram stain does not show any organism, culture is still pending,NGTD.  Synovial fluid WBC count is 49,000.  Positive for calcium pyroPhosphate crystals - Orthopedics suspected this is severe osteoarthritis rather than septic. Abx d/ced, she is started on steroid taper per Ortho recommendation, detail please see Dr. Ayesha Rumpf note from April 13 -Continue report knee pain, she does not want to go to skilled nursing facility , she want to go home with home health but she does not feel ready to be discharged today  Suspected severe sepsis on presentation with leukocytosis, fever and chills she was started on broad-spectrum antibiotics, synovial fluid culture and blood culture no growth to date, leukocytosis resolved, antibiotic discontinued per Ortho recommendation Sepsis ruled out  Hyponatremia Sodium 130 on presentation, she received hydration, sodium normalized 136 today Continue monitor  Hypokalemia K3.2, replace K, check mag  Anemia Does not appear to have acute blood loss Will check FOBT Repeat CBC in the morning  Chronic diarrhea Continue Lomotil, add cholestyramine  Hypotension Blood pressure low normal Monitor  History of DVT History of provoked left lower extremity DVT in August 2021. On Eliquis at home, continue.  Chronic pain syndrome: On Gabapentin, amitriptyline, as needed oxycodone at home  History of anxiety/insomnia: On Xanax and Ambien as needed  Debility/deconditioning:PT/OT consulted, recommended skilled nursing facility on discharge.  Patient declined skilled nursing facility and wants to go home with home health.  PT/OT recommended few more sessions before safe discharge.     The patient's BMI is: Body mass index  is 29.72 kg/m.[place]     Unresulted Labs (From admission, onward)          Start     Ordered   07/09/20 1610  Basic metabolic panel  Tomorrow morning,   R       Question:  Specimen collection method   Answer:  Lab=Lab collect   07/08/20 1504   07/09/20 0500  Magnesium  Tomorrow morning,   R       Question:  Specimen collection method  Answer:  Lab=Lab collect   07/08/20 1504   07/08/20 1505  CBC with Differential/Platelet  Once,   R       Question:  Specimen collection method  Answer:  Lab=Lab collect   07/08/20 1504   07/05/20 1826  Body fluid culture  (Arthrocentesis Panel)  ONCE - STAT,   STAT       Question:  Are there also cytology or pathology orders on this specimen?  Answer:  No   07/05/20 1831   Unscheduled  Occult blood card to lab, stool  As needed,   R      07/08/20 1502            DVT prophylaxis: SCDs Start: 07/05/20 2322 apixaban (ELIQUIS) tablet 5 mg   Code Status:full Family Communication: Patient Disposition:   Status is: Inpatient   Dispo: The patient is from: Home              Anticipated d/c is to: Home with home health, patient declined SNF              Anticipated d/c date is: 24 to 48 hours, pending pain control and PT eval                Consultants:   Orthopedics  Procedures:   Left knee arthrocentesis in the ED on 4/11  Antimicrobials:    Anti-infectives (From admission, onward)   Start     Dose/Rate Route Frequency Ordered Stop   07/06/20 2200  vancomycin (VANCOREADY) IVPB 1000 mg/200 mL  Status:  Discontinued        1,000 mg 200 mL/hr over 60 Minutes Intravenous Every 24 hours 07/06/20 0005 07/07/20 1351   07/06/20 0930  cefTRIAXone (ROCEPHIN) 2 g in sodium chloride 0.9 % 100 mL IVPB  Status:  Discontinued        2 g 200 mL/hr over 30 Minutes Intravenous Every 24 hours 07/06/20 0822 07/07/20 1351   07/05/20 2030  vancomycin (VANCOREADY) IVPB 1500 mg/300 mL        1,500 mg 150 mL/hr over 120 Minutes Intravenous  Once 07/05/20 2029 07/06/20 0025   07/05/20 2030  cefTRIAXone (ROCEPHIN) 1 g in sodium chloride 0.9 % 100 mL IVPB        1 g 200 mL/hr over 30 Minutes Intravenous  Once 07/05/20 2029 07/05/20 2201          Objective: Vitals:   07/07/20 1700 07/07/20 1900 07/08/20 0300 07/08/20 0815  BP: (!) 124/57  104/61 (!) 100/50  Pulse:  68 96 97  Resp: 18  15 19   Temp:  99.2 F (37.3 C) 97.7 F (36.5 C) 99.1 F (37.3 C)  TempSrc:  Oral Oral Oral  SpO2:    95%  Weight:      Height:        Intake/Output Summary (Last 24 hours) at 07/08/2020 1505 Last data filed at 07/08/2020 1300 Gross per 24 hour  Intake 910.73 ml  Output --  Net  910.73 ml   Filed Weights   07/05/20 2000 07/06/20 2153  Weight: 77.1 kg 81 kg    Examination:  General exam: does not open eyes during conversation but answers questions appropriately  Respiratory system: Clear to auscultation. Respiratory effort normal. Cardiovascular system: S1 & S2 heard,  Ectopic beats , + 2/6 precordial murmur best heard at apex  Gastrointestinal system: Abdomen is nondistended, soft and nontender. No organomegaly or masses felt. Normal bowel sounds heard. Central nervous system:  sleepy , does not open eyes but appear oriented. No focal neurological deficits. Extremities: left knee mild tender inferior lateral,  Skin: No rashes, lesions or ulcers Psychiatry: Judgement and insight appear normal. Mood & affect appropriate.     Data Reviewed: I have personally reviewed following labs and imaging studies  CBC: Recent Labs  Lab 07/05/20 1811 07/06/20 0033 07/07/20 0206 07/08/20 0338  WBC 14.7* 13.5* 9.4 7.3  NEUTROABS  --   --   --  4.3  HGB 12.1 11.4* 9.1* 8.5*  HCT 38.1 36.1 28.9* 26.3*  MCV 98.2 98.6 100.0 97.8  PLT 241 226 181 409    Basic Metabolic Panel: Recent Labs  Lab 07/05/20 1811 07/06/20 0033 07/07/20 0206 07/08/20 0338  NA 132* 133* 133* 136  K 3.4* 3.7 3.4* 3.2*  CL 100 102 107 111  CO2 25 23 19* 20*  GLUCOSE 106* 112* 100* 98  BUN 11 10 18 12   CREATININE 0.63 0.63 0.63 0.51  CALCIUM 9.1 8.8* 8.2* 8.3*  MG  --  1.8  --   --     GFR: Estimated Creatinine Clearance: 54.1 mL/min (by C-G  formula based on SCr of 0.51 mg/dL).  Liver Function Tests: Recent Labs  Lab 07/05/20 1811  AST 19  ALT 20  ALKPHOS 57  BILITOT 0.6  PROT 7.2  ALBUMIN 3.4*    CBG: No results for input(s): GLUCAP in the last 168 hours.   Recent Results (from the past 240 hour(s))  Blood culture (routine x 2)     Status: None (Preliminary result)   Collection Time: 07/05/20  5:50 PM   Specimen: BLOOD  Result Value Ref Range Status   Specimen Description BLOOD RIGHT ARM  Final   Special Requests   Final    BOTTLES DRAWN AEROBIC AND ANAEROBIC Blood Culture adequate volume   Culture   Final    NO GROWTH 3 DAYS Performed at [facility], [address], [place], [place] [address]    Report Status PENDING  Incomplete  Blood culture (routine x 2)     Status: None (Preliminary result)   Collection Time: 07/05/20  6:13 PM   Specimen: BLOOD  Result Value Ref Range Status   Specimen Description BLOOD SITE NOT SPECIFIED  Final   Special Requests   Final    BOTTLES DRAWN AEROBIC AND ANAEROBIC Blood Culture adequate volume   Culture   Final    NO GROWTH 3 DAYS Performed at [facility], [address], [place], [place] [address]    Report Status PENDING  Incomplete  Body fluid culture w Gram Stain     Status: None (Preliminary result)   Collection Time: 07/05/20  8:04 PM   Specimen: Synovium  Result Value Ref Range Status   Specimen Description SYNOVIAL  Final   Special Requests LEFT KNEE  Final   Gram Stain   Final    ABUNDANT WBC PRESENT, PREDOMINANTLY PMN NO ORGANISMS SEEN    Culture  Final    NO GROWTH 3 DAYS Performed at [facility], [place] [address] [place], [place] [address]    Report Status PENDING  Incomplete  SARS CORONAVIRUS 2 (TAT 6-24 HRS) Nasopharyngeal Nasopharyngeal Swab     Status: None   Collection Time: 07/05/20 10:38 PM   Specimen: Nasopharyngeal Swab  Result Value Ref Range Status   SARS Coronavirus 2 NEGATIVE NEGATIVE Final    Comment:  (NOTE) SARS-CoV-2 target nucleic acids are NOT DETECTED.  The SARS-CoV-2 RNA is generally detectable in upper and lower respiratory specimens during the acute phase of infection. Negative results do not preclude SARS-CoV-2 infection, do not rule out co-infections with other pathogens, and should not be used as the sole basis for treatment or other patient management decisions. Negative results must be combined with clinical observations, patient history, and epidemiological information. The expected result is Negative.  Fact Sheet for Patients: SugarRoll.be  Fact Sheet for Healthcare Providers: https://www.woods-mathews.com/  This test is not yet approved or cleared by the [place] FDA and  has been authorized for detection and/or diagnosis of SARS-CoV-2 by FDA under an Emergency Use Authorization (EUA). This EUA will remain  in effect (meaning this test can be used) for the duration of the COVID-19 declaration under Se ction 564(b)(1) of the Act, 21 U.S.C. section 360bbb-3(b)(1), unless the authorization is terminated or revoked sooner.  Performed at [facility], [place] [address] [place], [place] [address]   Surgical pcr screen     Status: None   Collection Time: 07/06/20  1:45 AM   Specimen: Nasal Mucosa; Nasal Swab  Result Value Ref Range Status   MRSA, PCR NEGATIVE NEGATIVE Final   Staphylococcus aureus NEGATIVE NEGATIVE Final    Comment: (NOTE) The Xpert SA Assay (FDA approved for NASAL specimens in patients 59 years of age and older), is one component of a comprehensive surveillance program. It is not intended to diagnose infection nor to guide or monitor treatment. Performed at [facility], [place] [address], [place], [place] [address]          Radiology Studies: No results found.      Scheduled Meds: . acetaminophen  650 mg Oral Q8H   Or  . acetaminophen  650 mg Rectal Q8H  . acidophilus  1  capsule Oral Daily  . amitriptyline  100 mg Oral QHS  . apixaban  5 mg Oral BID  . cholestyramine light  4 g Oral Q12H  . gabapentin  300 mg Oral QHS  . mometasone-formoterol  2 puff Inhalation BID  . pantoprazole  40 mg Oral Daily  . potassium chloride SA  20 mEq Oral Daily  . [START ON 07/14/2020] predniSONE  10 mg Oral Q breakfast  . [START ON 07/11/2020] predniSONE  20 mg Oral Q breakfast  . predniSONE  40 mg Oral Q breakfast  . [START ON 07/17/2020] predniSONE  5 mg Oral Q breakfast  . tamsulosin  0.4 mg Oral QHS   Continuous Infusions:    LOS: 3 days   Time spent: 60mins Greater than 50% of this time was spent in counseling, explanation of diagnosis, planning of further management, and coordination of care.   Voice Recognition Viviann Spare dictation system was used to create this note, attempts have been made to correct errors. Please contact the author with questions and/or clarifications.   Florencia Reasons, MD PhD FACP [facility]  Available via Epic secure chat 7am-7pm for nonurgent issues Please page for urgent  issues To page the attending provider between 7A-7P or the covering provider during after hours 7P-7A, please log into the web site [email or web address] and access using universal [place] password for that web site. If you do not have the password, please call the hospital operator.    07/08/2020, 3:05 PM

## 2020-07-08 NOTE — Plan of Care (Signed)

## 2020-07-08 NOTE — TOC Transition Note (Incomplete)
Transition of Care [facility]) - CM/SW Discharge Note   Patient Details  Name: Alexis Olson MRN: [id number] Date of Birth: [birth year]  [facility]) CM/SW Contact:  Sharin Mons, RN Phone Number: 07/08/2020, 10:02 AM   Clinical Narrative:    Patient will DC to: Anticipated DC date: Family notified: Transport by:   Per MD patient ready for DC to . RN, patient, patient's family, and facility notified of DC.  RNCM will sign off for now as intervention is no longer needed. Please consult [place] again if new needs arise.     Final next level of care: [place] Barriers to Discharge: No Barriers Identified   Patient Goals and CMS Choice     Choice offered to / list presented to : Patient  Discharge Placement                       Discharge Plan and Services   Discharge Planning Services: CM Consult                      HH Arranged: PT,OT [place]: [place] Date [place]: 07/07/20 Time [place]: 1006 Representative spoke with at [place]: Tommi Rumps ( via text message)  Social Determinants of Health ([place]) Interventions     Readmission Risk Interventions Readmission Risk Prevention Plan 11/16/2019  Transportation Screening Complete  PCP or Specialist Appt within 5-7 Days Complete  Home Care Screening Complete  Medication Review (RN CM) Complete  Some recent data might be hidden

## 2020-07-08 NOTE — Progress Notes (Signed)
Physical Therapy Treatment Patient Details Name: Alexis Olson MRN: [id number] DOB: 1934-01-15 Today's Date: 07/08/2020    History of Present Illness Pt is an 88 y/o female presenting with increased L knee pain. Per notes septic arthritis vs pseudogout likely. PMH includes R TKA, HTN, DVT, and asthma.    PT Comments    Pt seated on commode, she continues to require mod assistance overall and will benefit from continued rehab in a post acute setting.     Follow Up Recommendations  SNF;Supervision for mobility/OOB     Equipment Recommendations  Wheelchair (measurements PT);Wheelchair cushion (measurements PT);Hospital bed    Recommendations for Other Services       Precautions / Restrictions Precautions Precautions: Fall Restrictions Weight Bearing Restrictions: No    Mobility  Bed Mobility               General bed mobility comments: Pt seated on commode on arrival.    Transfers Overall transfer level: Needs assistance Equipment used: Rolling walker (2 wheeled) Transfers: Sit to/from Stand Sit to Stand: Mod assist         General transfer comment: Cues for hand placement, and forward weight shifting to rise into standing.  Pt required heavy mod assistance to achieve standing.  Poor eccentric load back to seated surface.  Ambulation/Gait Ambulation/Gait assistance: Mod assist Gait Distance (Feet): 8 Feet Assistive device: Rolling walker (2 wheeled) Gait Pattern/deviations: Step-to pattern;Antalgic;Trunk flexed;Shuffle     General Gait Details: Cues for sequencing and RW position and mild unsteadiness.  LOB noted x 2.   Stairs             Wheelchair Mobility    Modified Rankin (Stroke Patients Only)       Balance Overall balance assessment: Needs assistance Sitting-balance support: No upper extremity supported;Feet supported Sitting balance-Leahy Scale: Fair       Standing balance-Leahy Scale: Poor                               Cognition Arousal/Alertness: Awake/alert Behavior During Therapy: WFL for tasks assessed/performed Overall Cognitive Status: Impaired/Different from baseline Area of Impairment: Safety/judgement                         Safety/Judgement: Decreased awareness of deficits;Decreased awareness of safety            Exercises      General Comments        Pertinent Vitals/Pain Pain Assessment: Faces Faces Pain Scale: Hurts even more Pain Location: L knee Pain Descriptors / Indicators: Grimacing;Guarding Pain Intervention(s): Monitored during session;Repositioned    Home Living                      Prior Function            PT Goals (current goals can now be found in the care plan section) Acute Rehab PT Goals Patient Stated Goal: go home Potential to Achieve Goals: Good Progress towards PT goals: Progressing toward goals    Frequency    Min 3X/week      PT Plan Current plan remains appropriate    Co-evaluation              AM-PAC PT "6 Clicks" Mobility   Outcome Measure  Help needed turning from your back to your side while in a flat bed without using bedrails?: A Little Help needed moving  from lying on your back to sitting on the side of a flat bed without using bedrails?: A Lot Help needed moving to and from a bed to a chair (including a wheelchair)?: A Lot Help needed standing up from a chair using your arms (e.g., wheelchair or bedside chair)?: A Lot Help needed to walk in hospital room?: A Lot Help needed climbing 3-5 steps with a railing? : Total 6 Click Score: 12    End of Session Equipment Utilized During Treatment: Gait belt Activity Tolerance: Patient limited by pain Patient left: in bed;with call bell/phone within reach;with bed alarm set;with family/visitor present Nurse Communication: Mobility status PT Visit Diagnosis: Unsteadiness on feet (R26.81);Muscle weakness (generalized) (M62.81);Pain;Difficulty in walking,  not elsewhere classified (R26.2) Pain - Right/Left: Left Pain - part of body: Knee     Time: 2179-8102 PT Time Calculation (min) (ACUTE ONLY): 15 min  Charges:  $Therapeutic Activity: 8-22 mins                     Erasmo Leventhal , PTA Acute Rehabilitation Services Pager [phone] Office [phone]     Alexis Olson Alexis Olson 07/08/2020, 4:22 PM

## 2020-07-09 ENCOUNTER — Inpatient Hospital Stay (HOSPITAL_COMMUNITY): Payer: PPO

## 2020-07-09 DIAGNOSIS — E876 Hypokalemia: Secondary | ICD-10-CM | POA: Diagnosis not present

## 2020-07-09 DIAGNOSIS — M1120 Other chondrocalcinosis, unspecified site: Secondary | ICD-10-CM | POA: Diagnosis not present

## 2020-07-09 DIAGNOSIS — D649 Anemia, unspecified: Secondary | ICD-10-CM | POA: Diagnosis not present

## 2020-07-09 DIAGNOSIS — E871 Hypo-osmolality and hyponatremia: Secondary | ICD-10-CM | POA: Diagnosis not present

## 2020-07-09 LAB — RETICULOCYTES
Immature Retic Fract: 16.3 % — ABNORMAL HIGH (ref 2.3–15.9)
RBC.: 2.91 MIL/uL — ABNORMAL LOW (ref 3.87–5.11)
Retic Count, Absolute: 32.6 10*3/uL (ref 19.0–186.0)
Retic Ct Pct: 1.1 % (ref 0.4–3.1)

## 2020-07-09 LAB — BASIC METABOLIC PANEL
Anion gap: 8 (ref 5–15)
BUN: 11 mg/dL (ref 8–23)
CO2: 19 mmol/L — ABNORMAL LOW (ref 22–32)
Calcium: 8.8 mg/dL — ABNORMAL LOW (ref 8.9–10.3)
Chloride: 111 mmol/L (ref 98–111)
Creatinine, Ser: 0.47 mg/dL (ref 0.44–1.00)
GFR, Estimated: >60 mL/min (ref >60)
Glucose, Bld: 129 mg/dL — ABNORMAL HIGH (ref 70–99)
Potassium: 4.2 mmol/L (ref 3.5–5.1)
Sodium: 138 mmol/L (ref 135–145)

## 2020-07-09 LAB — BODY FLUID CULTURE W GRAM STAIN: Culture: NO GROWTH

## 2020-07-09 LAB — MAGNESIUM: Magnesium: 1.6 mg/dL — ABNORMAL LOW (ref 1.7–2.4)

## 2020-07-09 LAB — FOLATE: Folate: 31 ng/mL (ref >5.9)

## 2020-07-09 LAB — OCCULT BLOOD X 1 CARD TO LAB, STOOL: Fecal Occult Bld: NEGATIVE

## 2020-07-09 LAB — IRON AND TIBC
Iron: 32 ug/dL (ref 28–170)
Saturation Ratios: 21 % (ref 10.4–31.8)
TIBC: 153 ug/dL — ABNORMAL LOW (ref 250–450)
UIBC: 121 ug/dL

## 2020-07-09 LAB — VITAMIN B12: Vitamin B-12: 382 pg/mL (ref 180–914)

## 2020-07-09 LAB — FERRITIN: Ferritin: 815 ng/mL — ABNORMAL HIGH (ref 11–307)

## 2020-07-09 MED ORDER — MAGNESIUM SULFATE 2 GM/50ML IV SOLN
2.0000 g | Freq: Once | INTRAVENOUS | Status: AC
  Administered 2020-07-09: 2 g via INTRAVENOUS
  Filled 2020-07-09: qty 50

## 2020-07-09 NOTE — Progress Notes (Signed)
PROGRESS NOTE    Alexis Olson  OHK:[id number] DOB: Jun 20, 1933 DOA: 07/05/2020 PCP: Mosie Lukes, MD    Chief Complaint  Patient presents with  . Leg Pain    Patient reports history of septic joint in left leg.    Brief Narrative:  Patient is 87 year old female with history of chronic pain syndrome, anxiety, asthma, history of provoked left leg DVT on Eliquis who presented for the evaluation of left knee pain and swelling.   On presentation she was febrile, tachycardic.  Labs showed leukocytosis.  Suspected to have septic arthritis of the left knee initially , left knee was aspirated and was sent for Gram stain and culture which did not show any organism.  Synovial fluid showed WBC of 49,000.  Orthopedics thinks that this is associated with severe osteoarthritis.  Started on tapering steroid  PT/OT recommended skilled nursing facility but she wants to go home with home health.  Waiting for few more PT/OT sessions and adequate pain management before discharging to home with home health  Subjective:   She is awake, she continues to c/o left knee pain, reports barely able to bear weight,  She does not want to go to snf, she is not ready to go home either Reports has night sweats hgb dropped, no overt sign of bleeding ,FOBT ordered yesterday, not collected yet She reports h/o bypass surgery for weight loss has been on iv iron in the past then po iron supplement intermittently She also reports getting b12 shots intermittently   Assessment & Plan:   Principal Problem:   Septic arthritis ([place]) Active Problems:   Anxiety state   Essential hypertension   Asthma   Hypokalemia   Hyponatremia   DVT (deep venous thrombosis) (HCC)   Left knee arthritis -With history of chronic left knee pain,  - Presented with increased pain, swelling, subjective fevers/chills.  Lab work showed leukocytosis, elevated CRP ,she was tachycardic on presentation.  Clinical presentation suspicious for  septic arthritis of left knee initilally, she received vanc and Rocephin initially, Left knee aspirated in the emergency department.  -Synovial fluid Gram stain does not show any organism, culture is still pending,NGTD.  Synovial fluid WBC count is 49,000.  Positive for calcium pyroPhosphate crystals - Orthopedics suspected this is severe osteoarthritis rather than septic. Abx d/ced, she is started on steroid taper per Ortho recommendation, detail please see Dr. Alvan Dame note from April 13 -Continue report knee pain, she does not want to go to skilled nursing facility , she want to go home with home health but she does not feel ready to be discharged today, repeat left knee x ray with persistent joint effusion, paged [facility] for further recommendation   Suspected severe sepsis on presentation with leukocytosis, fever and chills she was started on broad-spectrum antibiotics, synovial fluid culture and blood culture no growth to date, leukocytosis resolved, antibiotic discontinued per Ortho recommendation Sepsis ruled out  Hyponatremia Sodium 130 on presentation, she received hydration, sodium normalized 136 today Continue monitor  Hypokalemia/hypomagnesemia K improved, mag 1.6, give 2 g mag  Anemia, likely multifactorial Does not appear to have acute blood loss, reported history of gastric bypass surgery, intermittently on IV iron/oral iron/subcu B12 injection B12 382, folate 31, iron saturation 21%, reticulocyte count inappropriately low Will check FOBT Repeat CBC in the morning  Chronic diarrhea Continue Lomotil, add cholestyramine  Hypotension Blood pressure low normal Monitor  History of DVT History of provoked left lower extremity DVT in August 2021. On Eliquis  at home, continue.  Chronic pain syndrome: On Gabapentin, amitriptyline, as needed oxycodone at home  History of anxiety/insomnia: On Xanax and Ambien as needed  Debility/deconditioning:PT/OT consulted, recommended  skilled nursing facility on discharge.  Patient declined skilled nursing facility and wants to go home with home health.  PT/OT recommended few more sessions before safe discharge.     The patient's BMI is: Body mass index is 29.72 kg/m.Marland Kitchen     Unresulted Labs (From admission, onward)          Start     Ordered   07/10/20 0500  CBC with Differential/Platelet  Tomorrow morning,   R       Question:  Specimen collection method  Answer:  Lab=Lab collect   07/09/20 1018   07/10/20 0500  Magnesium  Tomorrow morning,   R       Question:  Specimen collection method  Answer:  Lab=Lab collect   07/09/20 1018   07/05/20 1826  Body fluid culture  (Arthrocentesis Panel)  ONCE - STAT,   STAT       Question:  Are there also cytology or pathology orders on this specimen?  Answer:  No   07/05/20 1831   Unscheduled  Occult blood card to lab, stool  As needed,   R      07/08/20 1502   Unscheduled  Occult blood card to lab, stool  As needed,   R      07/09/20 1010            DVT prophylaxis: SCDs Start: 07/05/20 2322 apixaban (ELIQUIS) tablet 5 mg   Code Status:full Family Communication: Patient Disposition:   Status is: Inpatient   Dispo: The patient is from: Home              Anticipated d/c is to: Home with home health, patient declined SNF              Anticipated d/c date is: 24 to 48 hours, pending pain control and PT eval                Consultants:   orthopedics  Procedures:   Left knee arthrocentesis in the ED on 4/11  Antimicrobials:    Anti-infectives (From admission, onward)   Start     Dose/Rate Route Frequency Ordered Stop   07/06/20 2200  vancomycin (VANCOREADY) IVPB 1000 mg/200 mL  Status:  Discontinued        1,000 mg 200 mL/hr over 60 Minutes Intravenous Every 24 hours 07/06/20 0005 07/07/20 1351   07/06/20 0930  cefTRIAXone (ROCEPHIN) 2 g in sodium chloride 0.9 % 100 mL IVPB  Status:  Discontinued        2 g 200 mL/hr over 30 Minutes Intravenous Every 24  hours 07/06/20 0822 07/07/20 1351   07/05/20 2030  vancomycin (VANCOREADY) IVPB 1500 mg/300 mL        1,500 mg 150 mL/hr over 120 Minutes Intravenous  Once 07/05/20 2029 07/06/20 0025   07/05/20 2030  cefTRIAXone (ROCEPHIN) 1 g in sodium chloride 0.9 % 100 mL IVPB        1 g 200 mL/hr over 30 Minutes Intravenous  Once 07/05/20 2029 07/05/20 2201         Objective: Vitals:   07/08/20 1959 07/09/20 0429 07/09/20 0747 07/09/20 0828  BP:  (!) 97/55  111/60  Pulse:  79  82  Resp:  17  16  Temp:  98.3 F (36.8 C)  98.2 F (36.8 C)  TempSrc:  Oral  Oral  SpO2: 96% 93% 93% 95%  Weight:      Height:        Intake/Output Summary (Last 24 hours) at 07/09/2020 1337 Last data filed at 07/09/2020 1300 Gross per 24 hour  Intake 360 ml  Output --  Net 360 ml   Filed Weights   07/05/20 2000 07/06/20 2153  Weight: 77.1 kg 81 kg    Examination:  General exam: does not open eyes during conversation but answers questions appropriately  Respiratory system: Clear to auscultation. Respiratory effort normal. Cardiovascular system: S1 & S2 heard,  Ectopic beats , + 2/6 precordial murmur best heard at apex  Gastrointestinal system: Abdomen is nondistended, soft and nontender. No organomegaly or masses felt. Normal bowel sounds heard. Central nervous system:  sleepy , does not open eyes but appear oriented. No focal neurological deficits. Extremities: left knee mild tender inferior lateral,  Skin: No rashes, lesions or ulcers Psychiatry: Judgement and insight appear normal. Mood & affect appropriate.     Data Reviewed: I have personally reviewed following labs and imaging studies  CBC: Recent Labs  Lab 07/05/20 1811 07/06/20 0033 07/07/20 0206 07/08/20 0338 07/08/20 1527  WBC 14.7* 13.5* 9.4 7.3 7.3  NEUTROABS  --   --   --  4.3 6.1  HGB 12.1 11.4* 9.1* 8.5* 9.3*  HCT 38.1 36.1 28.9* 26.3* 29.7*  MCV 98.2 98.6 100.0 97.8 96.7  PLT 241 226 181 206 789    Basic Metabolic  Panel: Recent Labs  Lab 07/05/20 1811 07/06/20 0033 07/07/20 0206 07/08/20 0338 07/09/20 0225  NA 132* 133* 133* 136 138  K 3.4* 3.7 3.4* 3.2* 4.2  CL 100 102 107 111 111  CO2 25 23 19* 20* 19*  GLUCOSE 106* 112* 100* 98 129*  BUN 11 10 18 12 11   CREATININE 0.63 0.63 0.63 0.51 0.47  CALCIUM 9.1 8.8* 8.2* 8.3* 8.8*  MG  --  1.8  --   --  1.6*    GFR: Estimated Creatinine Clearance: 54.1 mL/min (by C-G formula based on SCr of 0.47 mg/dL).  Liver Function Tests: Recent Labs  Lab 07/05/20 1811  AST 19  ALT 20  ALKPHOS 57  BILITOT 0.6  PROT 7.2  ALBUMIN 3.4*    CBG: No results for input(s): GLUCAP in the last 168 hours.   Recent Results (from the past 240 hour(s))  Blood culture (routine x 2)     Status: None (Preliminary result)   Collection Time: 07/05/20  5:50 PM   Specimen: BLOOD  Result Value Ref Range Status   Specimen Description BLOOD RIGHT ARM  Final   Special Requests   Final    BOTTLES DRAWN AEROBIC AND ANAEROBIC Blood Culture adequate volume   Culture   Final    NO GROWTH 4 DAYS Performed at [facility], [address] [place], [place] [address]    Report Status PENDING  Incomplete  Blood culture (routine x 2)     Status: None (Preliminary result)   Collection Time: 07/05/20  6:13 PM   Specimen: BLOOD  Result Value Ref Range Status   Specimen Description BLOOD SITE NOT SPECIFIED  Final   Special Requests   Final    BOTTLES DRAWN AEROBIC AND ANAEROBIC Blood Culture adequate volume   Culture   Final    NO GROWTH 4 DAYS Performed at [facility], [address], [place], [place] [address]    Report Status PENDING  Incomplete  Body fluid culture w Gram Stain     Status: None   Collection Time: 07/05/20  8:04 PM   Specimen: Synovium  Result Value Ref Range Status   Specimen Description SYNOVIAL  Final   Special Requests LEFT KNEE  Final   Gram Stain   Final    ABUNDANT WBC PRESENT, PREDOMINANTLY PMN NO ORGANISMS SEEN    Culture    Final    NO GROWTH 3 DAYS Performed at [facility], [place] [address] [place], [place] [address]    Report Status 07/09/2020 FINAL  Final  SARS CORONAVIRUS 2 (TAT 6-24 HRS) Nasopharyngeal Nasopharyngeal Swab     Status: None   Collection Time: 07/05/20 10:38 PM   Specimen: Nasopharyngeal Swab  Result Value Ref Range Status   SARS Coronavirus 2 NEGATIVE NEGATIVE Final    Comment: (NOTE) SARS-CoV-2 target nucleic acids are NOT DETECTED.  The SARS-CoV-2 RNA is generally detectable in upper and lower respiratory specimens during the acute phase of infection. Negative results do not preclude SARS-CoV-2 infection, do not rule out co-infections with other pathogens, and should not be used as the sole basis for treatment or other patient management decisions. Negative results must be combined with clinical observations, patient history, and epidemiological information. The expected result is Negative.  Fact Sheet for Patients: SugarRoll.be  Fact Sheet for Healthcare Providers: https://www.woods-mathews.com/  This test is not yet approved or cleared by the [place] FDA and  has been authorized for detection and/or diagnosis of SARS-CoV-2 by FDA under an Emergency Use Authorization (EUA). This EUA will remain  in effect (meaning this test can be used) for the duration of the COVID-19 declaration under Se ction 564(b)(1) of the Act, 21 U.S.C. section 360bbb-3(b)(1), unless the authorization is terminated or revoked sooner.  Performed at [facility], [place] [address] [place], [place] [address]   Surgical pcr screen     Status: None   Collection Time: 07/06/20  1:45 AM   Specimen: Nasal Mucosa; Nasal Swab  Result Value Ref Range Status   MRSA, PCR NEGATIVE NEGATIVE Final   Staphylococcus aureus NEGATIVE NEGATIVE Final    Comment: (NOTE) The Xpert SA Assay (FDA approved for NASAL specimens in patients 48 years of age and  older), is one component of a comprehensive surveillance program. It is not intended to diagnose infection nor to guide or monitor treatment. Performed at [facility], [place] [address] [place], [place] [address]          Radiology Studies: DG Knee Complete 4 Views Left  Result Date: 07/09/2020 CLINICAL DATA:  87 year old female with a history of septic arthritis EXAM: LEFT KNEE - COMPLETE 4+ VIEW COMPARISON:  07/05/2020 FINDINGS: Osteopenia. Lateral greater than medial joint space narrowing with sclerotic changes, marginal osteophyte formation. Degenerative changes at the patellofemoral joint. Redemonstration of suprapatellar joint effusion. No erosive changes of the bones. No acute displaced fracture. Vascular calcifications. IMPRESSION: Negative for acute fracture. Negative for erosive changes of the bones. Joint effusion persists, with advanced tricompartmental osteoarthritis. Electronically Signed   By: Corrie Mckusick D.O.   On: 07/09/2020 12:45        Scheduled Meds: . acetaminophen  650 mg Oral Q8H   Or  . acetaminophen  650 mg Rectal Q8H  . acidophilus  1 capsule Oral Daily  . amitriptyline  100 mg Oral QHS  . apixaban  5 mg Oral BID  . cholestyramine light  4 g Oral Q12H  .  gabapentin  300 mg Oral QHS  . mometasone-formoterol  2 puff Inhalation BID  . pantoprazole  40 mg Oral Daily  . potassium chloride SA  20 mEq Oral Daily  . [START ON 07/14/2020] predniSONE  10 mg Oral Q breakfast  . [START ON 07/11/2020] predniSONE  20 mg Oral Q breakfast  . predniSONE  40 mg Oral Q breakfast  . [START ON 07/17/2020] predniSONE  5 mg Oral Q breakfast  . tamsulosin  0.4 mg Oral QHS   Continuous Infusions:    LOS: 4 days   Time spent: 35mins Greater than 50% of this time was spent in counseling, explanation of diagnosis, planning of further management, and coordination of care.   Voice Recognition Viviann Spare dictation system was used to create this note, attempts have been  made to correct errors. Please contact the author with questions and/or clarifications.   Florencia Reasons, MD PhD FACP [facility]  Available via Epic secure chat 7am-7pm for nonurgent issues Please page for urgent issues To page the attending provider between 7A-7P or the covering provider during after hours 7P-7A, please log into the web site [email or web address] and access using universal [place] password for that web site. If you do not have the password, please call the hospital operator.    07/09/2020, 1:37 PM

## 2020-07-09 NOTE — Progress Notes (Signed)
Occupational Therapy Treatment Patient Details Name: Alexis Olson MRN: [id number] DOB: Sep 26, 1933 Today's Date: 07/09/2020    History of present illness Pt is an 87 y/o female presenting with increased L knee pain. Per notes septic arthritis vs pseudogout likely. PMH includes R TKA, HTN, DVT, and asthma.   OT comments  Pt making progress with functional goals. Pt in recliner upon arrival. Session focused LB ADLs seated in recliner and on sit - stand with RW in prep for [facility] transfers and toileting tasks. Pt with improved sit - stand  To mod A from last OT session. OT will continue to follow acutely to maximize level of function and safety  Follow Up Recommendations  SNF;Other (comment) (pt refusing SNF)    Equipment Recommendations  Wheelchair (measurements OT);Wheelchair cushion (measurements OT);Other (comment);3 in 1 bedside commode (RW)    Recommendations for Other Services      Precautions / Restrictions Precautions Precautions: Fall Restrictions Weight Bearing Restrictions: No       Mobility Bed Mobility Overal bed mobility: Needs Assistance Bed Mobility: Sit to Supine       Sit to supine: Min assist   General bed mobility comments: pt in recliner upon arrival    Transfers Overall transfer level: Needs assistance Equipment used: Rolling walker (2 wheeled) Transfers: Sit to/from Stand Sit to Stand: Mod assist Stand pivot transfers: Min assist       General transfer comment: increased time to scoot to edge of recliner, cues for correct hand placement    Balance Overall balance assessment: Needs assistance Sitting-balance support: No upper extremity supported;Feet supported Sitting balance-Leahy Scale: Fair     Standing balance support: Bilateral upper extremity supported;During functional activity Standing balance-Leahy Scale: Poor                             ADL either performed or assessed with clinical judgement   ADL Overall ADL's :  Needs assistance/impaired     Grooming: Supervision/safety;Set up;Wash/dry face;Wash/dry hands   Upper Body Bathing: Set up;Supervision/ safety;Sitting   Lower Body Bathing: Moderate assistance;Sitting/lateral leans Lower Body Bathing Details (indicate cue type and reason): simulated seated in recliner Upper Body Dressing : Set up;Supervision/safety;Sitting       Toilet Transfer: Moderate assistance;RW;Stand-pivot;Cueing for safety   Toileting- Clothing Manipulation and Hygiene: Total assistance       Functional mobility during ADLs: Moderate assistance;Rolling walker;Cueing for safety General ADL Comments: pt with improved sit - stand, SPTs this session     Vision Baseline Vision/History: Wears glasses Wears Glasses: Distance only Patient Visual Report: No change from baseline     Perception     Praxis      Cognition Arousal/Alertness: Awake/alert Behavior During Therapy: WFL for tasks assessed/performed Overall Cognitive Status: Impaired/Different from baseline Area of Impairment: Safety/judgement                         Safety/Judgement: Decreased awareness of deficits;Decreased awareness of safety              Exercises     Shoulder Instructions       General Comments      Pertinent Vitals/ Pain       Pain Assessment: Faces Faces Pain Scale: Hurts even more Pain Location: L knee Pain Descriptors / Indicators: Grimacing;Guarding Pain Intervention(s): Limited activity within patient's tolerance;Monitored during session;Repositioned  Home Living  Prior Functioning/Environment              Frequency  Min 2X/week        Progress Toward Goals  OT Goals(current goals can now be found in the care plan section)  Progress towards OT goals: Progressing toward goals  Acute Rehab OT Goals Patient Stated Goal: go home  Plan Discharge plan remains appropriate     Co-evaluation                 AM-PAC OT "6 Clicks" Daily Activity     Outcome Measure   Help from another person eating meals?: None Help from another person taking care of personal grooming?: A Little Help from another person toileting, which includes using toliet, bedpan, or urinal?: Total Help from another person bathing (including washing, rinsing, drying)?: A Lot Help from another person to put on and taking off regular upper body clothing?: A Little Help from another person to put on and taking off regular lower body clothing?: Total 6 Click Score: 14    End of Session Equipment Utilized During Treatment: Gait belt;Rolling walker;Other (comment) (BSC)  OT Visit Diagnosis: Unsteadiness on feet (R26.81);Other abnormalities of gait and mobility (R26.89);Muscle weakness (generalized) (M62.81);Pain Pain - Right/Left: Left Pain - part of body: Knee   Activity Tolerance Patient tolerated treatment well   Patient Left with call bell/phone within reach;with chair alarm set;in chair   Nurse Communication          Time: 7353-2992 OT Time Calculation (min): 25 min  Charges: OT General Charges $OT Visit: 1 Visit OT Treatments $Self Care/Home Management : 8-22 mins $Therapeutic Activity: 8-22 mins     Britt Bottom 07/09/2020, 3:19 PM

## 2020-07-09 NOTE — TOC Progression Note (Signed)
Transition of Care [facility]) - Progression Note    Patient Details  Name: Alexis Olson MRN: [id number] Date of Birth: 07-Nov-1933  [facility] CM/SW Contact  Sharin Mons, RN Phone Number: 07/09/2020, 10:15 AM  Clinical Narrative:    Pt with persistent c/o knee pain, continued workup needed. Once medically ready pt will d/c to home with home health services. Pt setup with [facility]. Pt without DME needs.   TOC team will continue to monitor and assist with TOC needs.   Expected Discharge Plan: [place] Barriers to Discharge: Continued Medical Work up  Expected Discharge Plan and Services Expected Discharge Plan: [place]   Discharge Planning Services: CM Consult   Living arrangements for the past 2 months: [place]: PT,OT [place]: [place] Date [facility] Agency Contacted: 07/07/20 Time [place]: 1006 Representative spoke with at [place]: Tommi Rumps ( via text message)   Social Determinants of Health ([place]) Interventions    Readmission Risk Interventions Readmission Risk Prevention Plan 11/16/2019  Transportation Screening Complete  PCP or Specialist Appt within 5-7 Days Complete  Home Care Screening Complete  Medication Review (RN CM) Complete  Some recent data might be hidden

## 2020-07-09 NOTE — Progress Notes (Signed)
Physical Therapy Treatment Patient Details Name: Alexis Olson MRN: [id number] DOB: [birth year] Today's Date: 07/09/2020    History of Present Illness Pt is an 87 y/o female presenting with increased L knee pain. Per notes septic arthritis vs pseudogout likely. PMH includes R TKA, HTN, DVT, and asthma.    PT Comments    Pt seated in recliner. Performed transfer to commode and from chair with improved safety and decreased assistance.  Pt continues to require min assistance overall and continues to benefit from skilled nursing for rehab before returning home.  Pt remains to decline SNF and wants to return home.  Plan for stair training next session if she continues to declare return home.      Follow Up Recommendations  SNF;Supervision for mobility/OOB     Equipment Recommendations  Wheelchair (measurements PT);Wheelchair cushion (measurements PT);Hospital bed    Recommendations for Other Services       Precautions / Restrictions Precautions Precautions: Fall Restrictions Weight Bearing Restrictions: No    Mobility  Bed Mobility Overal bed mobility: Needs Assistance Bed Mobility: Sit to Supine       Sit to supine: Min assist   General bed mobility comments: Min assistance to lift LEs back to bed. Cues for technique to boost to [facility].    Transfers Overall transfer level: Needs assistance Equipment used: Rolling walker (2 wheeled) Transfers: Sit to/from Stand Sit to Stand: Min guard Stand pivot transfers: Min assist       General transfer comment: Cues for hand placement to push from recliner and bed side commode into standing.  Better translation this pm.  No LOB.  Ambulation/Gait Ambulation/Gait assistance: Min assist Gait Distance (Feet): 12 Feet Assistive device: Rolling walker (2 wheeled) Gait Pattern/deviations: Step-to pattern;Antalgic;Trunk flexed;Shuffle;Decreased stance time - left;Decreased step length - right     General Gait Details: Cues for  sequencing and RW position and mild unsteadiness.  No LOB this session.  Poor foot clearance this session due to pain.   Stairs             Wheelchair Mobility    Modified Rankin (Stroke Patients Only)       Balance Overall balance assessment: Needs assistance Sitting-balance support: No upper extremity supported;Feet supported Sitting balance-Leahy Scale: Fair     Standing balance support: Bilateral upper extremity supported;During functional activity Standing balance-Leahy Scale: Poor                              Cognition Arousal/Alertness: Awake/alert Behavior During Therapy: WFL for tasks assessed/performed Overall Cognitive Status: Impaired/Different from baseline Area of Impairment: Safety/judgement                         Safety/Judgement: Decreased awareness of deficits;Decreased awareness of safety            Exercises      General Comments        Pertinent Vitals/Pain Pain Assessment: Faces Faces Pain Scale: Hurts even more Pain Location: L knee Pain Descriptors / Indicators: Grimacing;Guarding Pain Intervention(s): Limited activity within patient's tolerance;Monitored during session;Repositioned    Home Living                      Prior Function            PT Goals (current goals can now be found in the care plan section) Acute Rehab PT Goals Patient Stated Goal:  go home Potential to Achieve Goals: Good Progress towards PT goals: Progressing toward goals    Frequency    Min 3X/week      PT Plan Current plan remains appropriate    Co-evaluation              AM-PAC PT "6 Clicks" Mobility   Outcome Measure  Help needed turning from your back to your side while in a flat bed without using bedrails?: A Little Help needed moving from lying on your back to sitting on the side of a flat bed without using bedrails?: A Little Help needed moving to and from a bed to a chair (including a  wheelchair)?: A Little Help needed standing up from a chair using your arms (e.g., wheelchair or bedside chair)?: A Little Help needed to walk in hospital room?: A Little Help needed climbing 3-5 steps with a railing? : A Lot 6 Click Score: 17    End of Session Equipment Utilized During Treatment: Gait belt Activity Tolerance: Patient limited by pain Patient left: in bed;with call bell/phone within reach;with bed alarm set Nurse Communication: Mobility status PT Visit Diagnosis: Unsteadiness on feet (R26.81);Muscle weakness (generalized) (M62.81);Pain;Difficulty in walking, not elsewhere classified (R26.2) Pain - Right/Left: Left Pain - part of body: Knee     Time: 1411-1436 PT Time Calculation (min) (ACUTE ONLY): 25 min  Charges:  $Gait Training: 8-22 mins $Therapeutic Activity: 8-22 mins                     Erasmo Leventhal , PTA Acute Rehabilitation Services Pager [phone] Office [phone]     Herminio Kniskern Eli Hose 07/09/2020, 3:54 PM

## 2020-07-09 NOTE — Progress Notes (Signed)
Bedside shift report completed. Received patient awake,alert/orientedx4 and able to verbalize needs. NAD noted; respirations easy/even on room air. Edema noted to bilateral lower extremities. Movement/sensation to all extremities noted. SCDs on to bilateral lower extremities. Whiteboard updated. All safety measures in place and personal belongings within reach.

## 2020-07-09 NOTE — Plan of Care (Signed)
  Problem: Education: Goal: Knowledge of General Education information will improve Description: Including pain rating scale, medication(s)/side effects and non-pharmacologic comfort measures Outcome: Progressing   Problem: Clinical Measurements: Goal: Ability to maintain clinical measurements within normal limits will improve Outcome: Progressing Goal: Will remain free from infection Outcome: Progressing Goal: Diagnostic test results will improve Outcome: Progressing   Problem: Activity: Goal: Risk for activity intolerance will decrease Outcome: Progressing   Problem: Elimination: Goal: Will not experience complications related to bowel motility Outcome: Progressing

## 2020-07-09 NOTE — Care Management Important Message (Signed)
Important Message  Patient Details  Name: Alexis Olson MRN: [id number] Date of Birth: [birth year]   Medicare Important Message Given:  Yes - Important Message mailed due to current National Emergency   Verbal consent obtained due to current National Emergency  Relationship to patient: Self Contact Name: Shannel Call Date: 07/09/20  Time: 1315 Phone: [phone] Outcome: No Answer/Busy Important Message mailed to: Patient address on file    Delorse Lek 07/09/2020, 1:15 PM

## 2020-07-09 NOTE — Progress Notes (Signed)
Subjective:    Patient reports pain as moderate.   Patient seen in rounds for Dr. Alvan Dame. Patient is resting in bed with family at the bedside. She reports pain in the left knee, and is concerned about being able to ambulate stairs because of it.   Objective: Vital signs in last 24 hours: Temp:  [98.2 F (36.8 C)-98.7 F (37.1 C)] 98.2 F (36.8 C) (04/15 0828) Pulse Rate:  [58-82] 82 (04/15 0828) Resp:  [16-17] 16 (04/15 0828) BP: (97-121)/(55-66) 111/60 (04/15 0828) SpO2:  [93 %-96 %] 95 % (04/15 0828)  Intake/Output from previous day:  Intake/Output Summary (Last 24 hours) at 07/09/2020 1550 Last data filed at 07/09/2020 1518 Gross per 24 hour  Intake 410 ml  Output --  Net 410 ml     Intake/Output this shift: Total I/O In: 410 [P.O.:360; IV Piggyback:50] Out: -   Labs: Recent Labs    07/07/20 0206 07/08/20 0338 07/08/20 1527  HGB 9.1* 8.5* 9.3*   Recent Labs    07/08/20 0338 07/08/20 1527 07/09/20 0225  WBC 7.3 7.3  --   RBC 2.69* 3.07* 2.91*  HCT 26.3* 29.7*  --   PLT 206 231  --    Recent Labs    07/08/20 0338 07/09/20 0225  NA 136 138  K 3.2* 4.2  CL 111 111  CO2 20* 19*  BUN 12 11  CREATININE 0.51 0.47  GLUCOSE 98 129*  CALCIUM 8.3* 8.8*   No results for input(s): LABPT, INR in the last 72 hours.  Exam: General - Patient is Alert and Oriented Extremity - Neurologically intact Sensation intact distally Intact pulses distally Dorsiflexion/Plantar flexion intact  Left Knee Exam: Mild effusion. Ecchymosis about the medial knee. No erythema or warmth. Generalized tenderness to palpation.   Motor Function - intact, moving foot and toes well on exam.   Past Medical History:  Diagnosis Date  . ALLERGIC RHINITIS   . ANEMIA, CHRONIC    Malabsorption related to bypass hx, B12 and iron deficiency  . ANXIETY   . Arthritis   . ASTHMA   . Bariatric surgery status   . Bowel perforation ([place]) 2016  . C. difficile diarrhea   . Colon  perforation ([place]) 08/2014   following polypetomy during colo, surgical repair  . COLONIC POLYPS, ADENOMATOUS, HX OF 2010  . DIVERTICULOSIS, COLON   . DVT (deep venous thrombosis) ([place]) 2018  . DVT (deep venous thrombosis) ([place])   . GERD (gastroesophageal reflux disease) 08/23/2015  . Graded compression stocking in place   . History of blood transfusion   . History of migraine   . History of rheumatic fever   . HSV (herpes simplex virus) infection 08/16/2015  . Hyperlipidemia   . Hypertension    Denies  . Hyponatremia 05/02/2016  . Incomplete left bundle branch block (LBBB) 2016   Noted on EKG  . INSOMNIA   . Lumbosacral disc disease    Chronic pain; History of osteomyelitis 2010 following ESI complication  . Obesity   . Osteomyelitis ([place])   . Pelvic fracture ([place])   . Pneumonia   . Recurrent UTI 08/23/2015  . URINARY URGENCY   . Ventral hernia without obstruction or gangrene 05/22/2018  . VITAMIN B12 DEFICIENCY     Assessment/Plan:    Principal Problem:   Septic arthritis ([place]) Active Problems:   Anxiety state   Essential hypertension   Asthma   Hypokalemia   Hyponatremia   DVT (deep venous thrombosis) ([facility])  Estimated  body mass index is 29.72 kg/m as calculated from the following:   Height as of this encounter: 5\' 5"  (1.651 m).   Weight as of this encounter: 81 kg.   Assessment: 1. Osteoarthritis, left knee with acute exacerbation  Plan: Ms. Beason left knee was aspirated several days ago, and the results showed WBC of 46,000 which is indicative of inflammatory arthritis. She did also have evidence of CPPD. She is planning to have the knee replaced in the near future. We discussed aspiration and injection today for symptomatic relief, but she declines aspiration. She is agreeable to injection, and this was performed without difficulty. Left knee injected with 2 cc lidocaine, 2 cc depomedrol. Continue to ice the knee daily. Follow up in our office as an outpatient in the  next month.   Griffith Citron, PA-C Orthopedic Surgery [phone] 07/09/2020, 3:50 PM

## 2020-07-10 ENCOUNTER — Other Ambulatory Visit (HOSPITAL_COMMUNITY): Payer: Self-pay

## 2020-07-10 DIAGNOSIS — E871 Hypo-osmolality and hyponatremia: Secondary | ICD-10-CM | POA: Diagnosis not present

## 2020-07-10 DIAGNOSIS — E876 Hypokalemia: Secondary | ICD-10-CM | POA: Diagnosis not present

## 2020-07-10 DIAGNOSIS — M1120 Other chondrocalcinosis, unspecified site: Secondary | ICD-10-CM | POA: Diagnosis not present

## 2020-07-10 LAB — CBC WITH DIFFERENTIAL/PLATELET
Abs Immature Granulocytes: 0.04 10*3/uL (ref 0.00–0.07)
Basophils Absolute: 0 10*3/uL (ref 0.0–0.1)
Basophils Relative: 0 %
Eosinophils Absolute: 0 10*3/uL (ref 0.0–0.5)
Eosinophils Relative: 0 %
HCT: 27.2 % — ABNORMAL LOW (ref 36.0–46.0)
Hemoglobin: 9 g/dL — ABNORMAL LOW (ref 12.0–15.0)
Immature Granulocytes: 1 %
Lymphocytes Relative: 16 %
Lymphs Abs: 1 10*3/uL (ref 0.7–4.0)
MCH: 30.7 pg (ref 26.0–34.0)
MCHC: 33.1 g/dL (ref 30.0–36.0)
MCV: 92.8 fL (ref 80.0–100.0)
Monocytes Absolute: 0.3 10*3/uL (ref 0.1–1.0)
Monocytes Relative: 4 %
Neutro Abs: 4.6 10*3/uL (ref 1.7–7.7)
Neutrophils Relative %: 79 %
Platelets: 281 10*3/uL (ref 150–400)
RBC: 2.93 MIL/uL — ABNORMAL LOW (ref 3.87–5.11)
RDW: 14.7 % (ref 11.5–15.5)
WBC: 5.8 10*3/uL (ref 4.0–10.5)
nRBC: 0 % (ref 0.0–0.2)

## 2020-07-10 LAB — CULTURE, BLOOD (ROUTINE X 2)
Culture: NO GROWTH
Culture: NO GROWTH
Special Requests: ADEQUATE
Special Requests: ADEQUATE

## 2020-07-10 LAB — MAGNESIUM: Magnesium: 1.8 mg/dL (ref 1.7–2.4)

## 2020-07-10 MED ORDER — PREDNISONE 5 MG PO TABS
ORAL_TABLET | ORAL | 0 refills | Status: DC
  Filled 2020-07-10: qty 21, 9d supply, fill #0

## 2020-07-10 MED ORDER — OXYCODONE HCL 5 MG PO TABS
5.0000 mg | ORAL_TABLET | Freq: Four times a day (QID) | ORAL | 0 refills | Status: DC | PRN
  Filled 2020-07-10: qty 10, 3d supply, fill #0

## 2020-07-10 NOTE — Progress Notes (Addendum)
Physical Therapy Treatment Patient Details Name: Alexis Olson MRN: [id number] DOB: December 27, 1933 Today's Date: 07/10/2020    History of Present Illness Pt is an 87 y/o female presenting with increased L knee pain. Per notes septic arthritis vs pseudogout likely. PMH includes R TKA, HTN, DVT, and asthma.    PT Comments    Pt received in supine, agreeable to therapy session with maximal encouragement and with fair participation and tolerance for transfer and stair training. Pt able to ascend/descend curb height steps x2 with +1 modA but will have 4 STE home, pt defers to practice regular height steps in PT gym and reports she is confident she can get into home safety with physical assist from son and daughter Investment banker, corporate) who can provide lift assist.  Reviewed safe assisting strategies with pt as family not present during session and stair sequencing handout given for pt to give to family, as well as gait belt to take home and wear when performing transfers/stairs. Pt continues to need assistance to rise from bed/chair heights to RW and is unable to get fully upright without physical assist (min/modA). Pt continues to benefit from PT services to progress toward functional mobility goals. Continue to recommend SNF however pt planning to discharge home per chart review in which case HHPT would be beneficial, she would benefit from further stair/mobility training with family present if she is here following day.  Follow Up Recommendations  SNF;Supervision for mobility/OOB     Equipment Recommendations  Wheelchair (measurements PT);Wheelchair cushion (measurements PT);Hospital bed    Recommendations for Other Services       Precautions / Restrictions Precautions Precautions: Fall Restrictions Weight Bearing Restrictions: No    Mobility  Bed Mobility Overal bed mobility: Needs Assistance Bed Mobility: Supine to Sit;Sit to Supine     Supine to sit: Supervision Sit to supine: Min assist   General  bed mobility comments: Min assistance to lift LLE back to bed. able to sit EOB unassisted with bed rail support    Transfers Overall transfer level: Needs assistance Equipment used: Rolling walker (2 wheeled) Transfers: Sit to/from Stand Sit to Stand: Min assist         General transfer comment: increased time to perform, needs minA for hip extension into upright and for hand placement  Ambulation/Gait Ambulation/Gait assistance: Min guard Gait Distance (Feet): 10 Feet Assistive device: Rolling walker (2 wheeled) Gait Pattern/deviations: Step-to pattern;Antalgic;Trunk flexed;Shuffle;Decreased stance time - left;Decreased step length - right Gait velocity: <0.1 m/s Gait velocity interpretation: <1.8 ft/sec, indicate of risk for recurrent falls General Gait Details: Cues for sequencing and RW position, no LOB this session.  Poor foot clearance this session due to pain and self-limiting distance due to fatigue/pain (recent diarrhea)   Stairs Stairs: Yes Stairs assistance: Mod assist Stair Management: With cane;Forwards;Step to pattern (holding wall on R and cane on L, needs assist from PTA to stabilize cane) Number of Stairs: 2 General stair comments: pt ascended/descended 4" height step x2 trials with modA for support and pt unsteady with cane on L, needs assist to stabilize cane. Per pt she will have +2 assist to get into home upon discharge   Wheelchair Mobility    Modified Rankin (Stroke Patients Only)       Balance Overall balance assessment: Needs assistance Sitting-balance support: No upper extremity supported;Feet supported Sitting balance-Leahy Scale: Fair     Standing balance support: Bilateral upper extremity supported;During functional activity Standing balance-Leahy Scale: Poor Standing balance comment: Reliant on BUE support and  min guard                            Cognition Arousal/Alertness: Awake/alert Behavior During Therapy: WFL for tasks  assessed/performed Overall Cognitive Status: Impaired/Different from baseline                           Safety/Judgement: Decreased awareness of deficits   Problem Solving: Requires verbal cues General Comments: pt with good following of 1 and 2-step commands and able to verbalize/follow proper stair sequencing from previous PT in outpatient setting; somewhat self-limiting      Exercises Other Exercises Other Exercises: encouraged ankle pumps, quad sets, glute sets and to float heels in bed to prevent skin breakdown at heels; ice 30 mins on/off for pain    General Comments General comments (skin integrity, edema, etc.): VSS prior to mobility and no acute s/sx distress; self-limiting due to pain/fatigue      Pertinent Vitals/Pain Pain Assessment: Faces Faces Pain Scale: Hurts even more Pain Location: L knee Pain Descriptors / Indicators: Grimacing;Guarding Pain Intervention(s): Monitored during session;Premedicated before session;Repositioned;Ice applied    Home Living                      Prior Function            PT Goals (current goals can now be found in the care plan section) Acute Rehab PT Goals Patient Stated Goal: go home PT Goal Formulation: With patient Time For Goal Achievement: 07/20/20 Potential to Achieve Goals: Good Progress towards PT goals: Progressing toward goals (slow progress, self-limiting)    Frequency    Min 3X/week      PT Plan Current plan remains appropriate    Co-evaluation              AM-PAC PT "6 Clicks" Mobility   Outcome Measure  Help needed turning from your back to your side while in a flat bed without using bedrails?: A Little Help needed moving from lying on your back to sitting on the side of a flat bed without using bedrails?: A Little Help needed moving to and from a bed to a chair (including a wheelchair)?: A Little Help needed standing up from a chair using your arms (e.g., wheelchair or  bedside chair)?: A Lot (min to modA from bed, will need more assist from lower height) Help needed to walk in hospital room?: A Little Help needed climbing 3-5 steps with a railing? : A Lot 6 Click Score: 16    End of Session Equipment Utilized During Treatment: Gait belt Activity Tolerance: Patient limited by pain Patient left: in bed;with call bell/phone within reach;with bed alarm set Nurse Communication: Mobility status PT Visit Diagnosis: Unsteadiness on feet (R26.81);Muscle weakness (generalized) (M62.81);Pain;Difficulty in walking, not elsewhere classified (R26.2) Pain - Right/Left: Left Pain - part of body: Knee     Time: 1130-1153 PT Time Calculation (min) (ACUTE ONLY): 23 min  Charges:  $Gait Training: 8-22 mins $Therapeutic Activity: 8-22 mins                     Quashaun Lazalde P., PTA Acute Rehabilitation Services Pager: [phone] Office: [place] 07/10/2020, 12:12 PM

## 2020-07-10 NOTE — Discharge Summary (Addendum)
Discharge Summary  Alexis Olson WEX:[id number] DOB: 07-15-1933  PCP: Mosie Lukes, MD  Admit date: 07/05/2020 Discharge date: 07/10/2020  Time spent: 68mins  Recommendations for Outpatient Follow-up:  1. F/u with PCP within a week  for hospital discharge follow up, repeat cbc/bmp at follow up. pcp to monitor anemia 2. F/u with ortho in three weeks 3. Patient declined SNF, home health arranged    Discharge Diagnoses:  Active Hospital Problems   Diagnosis Date Noted  . Septic arthritis ([place]) 07/05/2020  . DVT (deep venous thrombosis) ([place]) 11/13/2019  . Hyponatremia 05/02/2016  . Hypokalemia   . Essential hypertension 03/30/2009  . Anxiety state 08/08/2007  . Asthma 09/18/2006    Resolved Hospital Problems  No resolved problems to display.    Discharge Condition: stable  Diet recommendation: regular diet  Filed Weights   07/05/20 2000 07/06/20 2153  Weight: 77.1 kg 81 kg    History of present illness: (Per admitting MD Dr. Myna Hidalgo) Patient coming from: Home   Chief Complaint: Left knee pain and swelling   HPI: Alexis Olson is a 87 y.o. female with medical history significant for chronic pain, anxiety, insomnia, asthma, and history of provoked left leg DVT on Eliquis, now presenting to the emergency department for evaluation of left knee pain and swelling.  Patient reports chronic arthritis with pain but has developed significant worsening in the left knee pain and swelling since 07/01/2020.  Pain and swelling now progressively worsened and today she was unable to bear weight due to pain.  She has also been experiencing subjective fevers and chills and general malaise over the past couple days.  She denies any chest pain, cough, or wheezing.  She took a dose of Eliquis this morning but not yet this evening.  ED Course: Upon arrival to the ED, patient is found to be febrile to 38 C, saturating mid 90s on room air, tachycardic in the 110s, and with blood pressure 106/65.   Chemistry panel notable for sodium 132 and potassium 3.4.  CBC features a leukocytosis to 14,700.  Lactic acid reassuringly normal.  Left knee was aspirated in the ED and fluid was sent for Gram stain and culture.  Blood cultures were also ordered as well as empiric antibiotics, orthopedic surgery was consulted by the ED physician, and hospitalist asked to admit.   Hospital Course:  Principal Problem:   Septic arthritis ([place]) Active Problems:   Anxiety state   Essential hypertension   Asthma   Hypokalemia   Hyponatremia   DVT (deep venous thrombosis) ([facility])   Left knee arthritis/ CPPD -With history of chronic left knee pain, Presented with increased pain, swelling, subjective fevers/chills. - Lab work on presentation showed leukocytosis, elevated CRP ,she was tachycardic on presentation. Clinical presentation suspicious for septic arthritis of left knee initilally, she received vanc and Rocephin initially,Left knee aspirated in the emergency department. -Synovial fluid Gram stain does not show any organism, synovial fluid culture no growth. Synovial fluid WBC count is 49,000.  Positive for calcium pyroPhosphate crystals -per Orthopedics, synovial fluid analysis indicative in inflammatory arthritisrather than septic. Abx d/ced, she is started on steroid taper per Ortho recommendation ( detail please see Dr. Alvan Dame note from April 13 and 4/15) -Continue report knee pain, repeat left knee x ray with persistent joint effusion, ortho reconsulted who did therapeutic injection to Left knee  with 2 cc lidocaine, 2 cc depomedrol on 4/15. Patient declined knee aspiration on 4/15. Continue to ice the knee daily -Pt  recommended short term SNF , she does not want to go to skilled nursing facility , she desires  to go home with home health , she feels ready to go home with home health today, she is advised to follow up with ortho in three weeks  Suspected severe sepsis on presentation with  leukocytosis, fever and chills for which she was started on broad-spectrum antibiotics, synovial fluid culture and blood culture no growth , leukocytosis resolved, antibiotic discontinued per Ortho recommendation Sepsis ruled out  Hypotension Blood pressure low normal initially , likely due to dehydration, she received hydration initially Has been normotensive for the last few days  Hyponatremia Sodium 130 on presentation, likely due to dehydration  she received hydration, sodium normalized 138 today   Hypokalemia/hypomagnesemia Received supplement , improved  Follow-up with PCP  Anemia, likely multifactorial Does not appear to have acute blood loss, 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 On Eliquis at home, continue. Patient report PCP recommend lifelong Eliquis, follow-up with PCP  Cardiac murmur, soft 2/6 Reports h/o childhood rheumatic fever  Denies chest pain, no sob, no syncope F/u with pcp  Chronic pain syndrome:OnGabapentin, amitriptyline, as needed oxycodone at home  History of anxiety/insomnia:On Xanax and Ambien as needed  Debility/deconditioning:PT/OTconsulted, recommended skilled nursing facility on discharge.Patient declined skilled nursing facility and wants to go home with home health.     The patient's BMI is: Body mass index is 29.72 kg/m.Marland Kitchen    DVT prophylaxis: SCDs Start: 07/05/20 2322 apixaban (ELIQUIS) tablet 5 mg   Code Status:full Family Communication: Patient, called daughter in patient's room, she is currently working in the ED at [employer], left  message for her to call back if questions , addendum: talked to daughter over the phone in the afternoon Disposition: Home with home health, patient declined SNF   Consultants:   orthopedics  Procedures:   Left knee arthrocentesis in the ED on 4/11  Therapeutic knee injection on 4/15  Antimicrobials:   Anti-infectives (From admission, onward)   Start     Dose/Rate Route Frequency Ordered Stop   07/06/20 2200  vancomycin (VANCOREADY) IVPB 1000 mg/200 mL  Status:  Discontinued        1,000 mg 200 mL/hr over 60 Minutes Intravenous Every 24 hours 07/06/20 0005 07/07/20 1351   07/06/20 0930  cefTRIAXone (ROCEPHIN) 2 g in sodium chloride 0.9 % 100 mL IVPB  Status:  Discontinued        2 g 200 mL/hr over 30 Minutes Intravenous Every 24 hours 07/06/20 0822 07/07/20 1351   07/05/20 2030  vancomycin (VANCOREADY) IVPB 1500 mg/300 mL        1,500 mg 150 mL/hr over 120 Minutes Intravenous  Once 07/05/20 2029 07/06/20 0025   07/05/20 2030  cefTRIAXone (ROCEPHIN) 1 g in sodium chloride 0.9 % 100 mL IVPB        1 g 200 mL/hr over 30 Minutes Intravenous  Once 07/05/20 2029 07/05/20 2201       Discharge Exam: BP (!) 116/93 (BP Location: Right Arm)   Pulse 71   Temp 98.5 F (36.9 C) (Oral)   Resp 17   Ht  5\' 5"  (1.651 m)   Wt 81 kg   SpO2 93%   BMI 29.72 kg/m   General: NAD Cardiovascular: RRR, + soft 2/6 murmur Respiratory: CTABL  Discharge Instructions You were cared for by a hospitalist during your hospital stay. If you have any questions about your discharge medications or the care you received while you were in the hospital after you are discharged, you can call the unit and asked to speak with the hospitalist on call if the hospitalist that took care of you is not available. Once you are discharged, your primary care physician will handle any further medical issues. Please note that NO REFILLS for any discharge medications will be authorized once you are discharged, as it is  imperative that you return to your primary care physician (or establish a relationship with a primary care physician if you do not have one) for your aftercare needs so that they can reassess your need for medications and monitor your lab values.  Discharge Instructions    Diet - low sodium heart healthy   Complete by: As directed    Increase activity slowly   Complete by: As directed      Allergies as of 07/10/2020      Reactions   Ativan [lorazepam] Anaphylaxis, Other (See Comments)   Deathly allergic; "resp rate dropped dropped to 6"   Other Anaphylaxis   Red peppers--choking, also   Azithromycin Diarrhea   Benadryl [diphenhydramine Hcl] Other (See Comments)   Paradoxical response   Ciprofloxacin Hcl Other (See Comments)   Causes yeast infection and refuses to take   Codeine Phosphate Nausea Only   Can tolerate in limited amounts   Hydrocodone Itching, Nausea And Vomiting   Hydrocodone-homatropine Itching, Nausea And Vomiting   Levaquin [levofloxacin In D5w] Other (See Comments)   Muscle soreness   Levofloxacin    Meperidine Hcl Other (See Comments)   Reaction ??   Molds & Smuts Other (See Comments)   unsure   Sulfa Antibiotics Other (See Comments)   Joint pain   Tape Other (See Comments)   No Coban wrap (per the patient)   Tramadol    Ultram [tramadol Hcl] Other (See Comments)   "Can't move joints"      Medication List    STOP taking these medications   amoxicillin-clavulanate 875-125 MG tablet Commonly known as: AUGMENTIN   ferrous sulfate 325 (65 FE) MG tablet   nitrofurantoin 100 MG capsule Commonly known as: MACRODANTIN     TAKE these medications   acetaminophen 500 MG tablet Commonly known as: TYLENOL Take 2 tablets (1,000 mg total) by mouth every 8 (eight) hours.   albuterol 108 (90 Base) MCG/ACT inhaler Commonly known as: VENTOLIN HFA Inhale 1-2 puffs into the lungs every 6 (six) hours as needed for wheezing or shortness of breath.   ALPRAZolam 1  MG tablet Commonly known as: XANAX Take 0.5-1 tablets (0.5-1 mg total) by mouth at bedtime as needed. What changed: reasons to take this   amitriptyline 100 MG tablet Commonly known as: ELAVIL TAKE 1 TABLET (100 MG TOTAL) BY MOUTH DAILY. What changed: how much to take   azelastine 0.05 % ophthalmic solution Commonly known as: OPTIVAR Place 1 drop into both eyes daily as needed (for dry eyes).   benzonatate 100 MG capsule Commonly known as: TESSALON TAKE 1 CAPSULE BY MOUTH 3 TIMES DAILY AS NEEDED FOR COUGH   budesonide-formoterol 160-4.5 MCG/ACT inhaler Commonly known as: SYMBICORT Inhale 2 puffs into the lungs See  admin instructions. Inhale 2 puffs into the lungs one to two times a day   cetirizine 10 MG tablet Commonly known as: ZYRTEC Take 10 mg by mouth daily.   cyanocobalamin 1000 MCG/ML injection Commonly known as: (VITAMIN B-12) INJECT 1CC INTO THE MUSCLE EVERY MONTH What changed:   how much to take  how to take this  when to take this   dicyclomine 10 MG capsule Commonly known as: BENTYL TAKE 1 CAPSULE BY MOUTH 3 TIMES DAILY AS NEEDED FOR DIARRHEA AND SYMPTOMS What changed: See the new instructions.   diphenoxylate-atropine 2.5-0.025 MG tablet Commonly known as: LOMOTIL TAKE 2 TABLETS BY MOUTH 3 TIMES DAILY AS NEEDED FOR DIARRHEA What changed:   how much to take  how to take this  when to take this  reasons to take this   docusate sodium 100 MG capsule Commonly known as: Colace Take 1 capsule (100 mg total) by mouth 2 (two) times daily. What changed:   when to take this  reasons to take this   Eliquis 5 MG Tabs tablet Generic drug: apixaban TAKE 1 TABLET BY MOUTH 2 TIMES DAILY What changed: how much to take   fluticasone 50 MCG/ACT nasal spray Commonly known as: FLONASE Place 2 sprays into the nose daily.   gabapentin 300 MG capsule Commonly known as: NEURONTIN TAKE 1 CAPSULE BY MOUTH AT BEDTIME   Hemocyte Plus 106-1 MG Caps TAKE 1  CAPSULE BY MOUTH 2 TIMES DAILY   hydrOXYzine 25 MG tablet Commonly known as: ATARAX/VISTARIL Take 25 mg by mouth 3 (three) times daily as needed for itching.   levocetirizine 5 MG tablet Commonly known as: XYZAL Take 1 tablet (5 mg total) by mouth every evening.   lubiprostone 24 MCG capsule Commonly known as: AMITIZA Take 24 mcg by mouth daily as needed for constipation.   magnesium oxide 400 MG tablet Commonly known as: MAG-OX Take 400 mg by mouth 2 (two) times daily.   methocarbamol 500 MG tablet Commonly known as: ROBAXIN TAKE 1 TABLET BY MOUTH EVERY 8 HOURS AS NEEDED FOR MUSCLE SPASMS   montelukast 10 MG tablet Commonly known as: SINGULAIR Take 10 mg by mouth daily as needed (For allergy).   omeprazole 20 MG capsule Commonly known as: PRILOSEC TAKE 1 CAPSULE BY MOUTH ONCE DAILY What changed: how much to take   ondansetron 4 MG disintegrating tablet Commonly known as: ZOFRAN-ODT Take 1 tablet (4 mg total) by mouth 3 (three) times daily as needed for nausea or vomiting.   oxyCODONE 5 MG immediate release tablet Commonly known as: Oxy IR/ROXICODONE Take 1 tablet (5 mg total) by mouth every 6 (six) hours as needed for severe pain.   potassium chloride SA 20 MEQ tablet Commonly known as: KLOR-CON TAKE 1 TABLET BY MOUTH ONCE A DAY What changed: how much to take   predniSONE 5 MG tablet Commonly known as: DELTASONE Take 20mg  (4tabs) daily with breakfast on 4/17, 4/18 and 4/19, then take 10mg  (2tabs) daily on 4/20, 4/21 and 4/22, then take 5mg  (1tab) daily on 4/23, 4/24, 4/25, then stop.   PROBIOTIC DAILY PO Take 1 capsule by mouth daily.   tamsulosin 0.4 MG Caps capsule Commonly known as: FLOMAX Take 1 capsule (0.4 mg total) by mouth daily after supper.   valACYclovir 500 MG tablet Commonly known as: VALTREX TAKE 1 TABLET BY MOUTH ONCE A DAY AS NEEDED. What changed:   how much to take  reasons to take this   Vitamin D (Ergocalciferol) 1.25  MG (50000 UNIT)  Caps capsule Commonly known as: DRISDOL TAKE 1 CAPSULE BY MOUTH EVERY 7 DAYS What changed: how much to take   zolpidem 5 MG tablet Commonly known as: AMBIEN TAKE 1 TABLET BY MOUTH AT BEDTIME What changed: how much to take      Allergies  Allergen Reactions  . Ativan [Lorazepam] Anaphylaxis and Other (See Comments)    Deathly allergic; "resp rate dropped dropped to 6"  . Other Anaphylaxis    Red peppers--choking, also  . Azithromycin Diarrhea  . Benadryl [Diphenhydramine Hcl] Other (See Comments)    Paradoxical response  . Ciprofloxacin Hcl Other (See Comments)    Causes yeast infection and refuses to take  . Codeine Phosphate Nausea Only    Can tolerate in limited amounts  . Hydrocodone Itching and Nausea And Vomiting  . Hydrocodone-Homatropine Itching and Nausea And Vomiting  . Levaquin [Levofloxacin In D5w] Other (See Comments)    Muscle soreness  . Levofloxacin   . Meperidine Hcl Other (See Comments)    Reaction ??  . Molds & Smuts Other (See Comments)    unsure  . Sulfa Antibiotics Other (See Comments)    Joint pain  . Tape Other (See Comments)    No Coban wrap (per the patient)  . Tramadol   . Ultram [Tramadol Hcl] Other (See Comments)    "Can't move joints"    Follow-up Information    Paralee Cancel, MD Follow up in 3 week(s).   Specialty: Orthopedic Surgery Contact information: [address] [place] [address] [phone]        Care, [facility] Follow up.   Specialty: Home Health Services Why: Home health services PT and OT services arranged, start of care within 48 hours post discharge Contact information: [address] [place] [address] [phone]        Mosie Lukes, MD Follow up in 1 week(s).   Specialty: Family Medicine Why: Hospital discharge follow up, pcp to monitor anemia.  Contact information: [place] [address] [phone]                 The results of significant diagnostics from this hospitalization (including imaging, microbiology, ancillary and laboratory) are listed below for reference.    Significant Diagnostic Studies: DG Knee Complete 4 Views Left  Result Date: 07/09/2020 CLINICAL DATA:  87 year old female with a history of septic arthritis EXAM: LEFT KNEE - COMPLETE 4+ VIEW COMPARISON:  07/05/2020 FINDINGS: Osteopenia. Lateral greater than medial joint space narrowing with sclerotic changes, marginal osteophyte formation. Degenerative changes at the patellofemoral joint. Redemonstration of suprapatellar joint effusion. No erosive changes of the bones. No acute displaced fracture. Vascular calcifications. IMPRESSION: Negative for acute fracture. Negative for erosive changes of the bones. Joint effusion persists, with advanced tricompartmental osteoarthritis. Electronically Signed   By: Corrie Mckusick D.O.   On: 07/09/2020 12:45   DG Knee Complete 4 Views Left  Result Date: 07/05/2020 CLINICAL DATA:  Left-sided knee pain EXAM: LEFT KNEE - COMPLETE 4+ VIEW COMPARISON:  None. FINDINGS: No fracture or malalignment. Advanced tricompartment arthritis. Joint space calcifications. Large suprapatellar knee effusion. Vascular calcifications. IMPRESSION: 1. 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 2. Chondrocalcinosis. No acute osseous abnormality. Electronically Signed   By: Donavan Foil M.D.   On: 07/05/2020 18:48    Microbiology: Recent Results (from the past 240 hour(s))  Blood culture (routine x 2)     Status: None (  Preliminary result)   Collection Time: 07/05/20  5:50 PM   Specimen: BLOOD  Result Value Ref Range Status   Specimen Description BLOOD RIGHT ARM  Final   Special Requests   Final    BOTTLES DRAWN AEROBIC AND ANAEROBIC Blood Culture adequate volume   Culture   Final    NO GROWTH 4 DAYS Performed at [facility], [address] [place], [place] [address]    Report Status PENDING   Incomplete  Blood culture (routine x 2)     Status: None (Preliminary result)   Collection Time: 07/05/20  6:13 PM   Specimen: BLOOD  Result Value Ref Range Status   Specimen Description BLOOD SITE NOT SPECIFIED  Final   Special Requests   Final    BOTTLES DRAWN AEROBIC AND ANAEROBIC Blood Culture adequate volume   Culture   Final    NO GROWTH 4 DAYS Performed at [facility], [address], [place], [place] [address]    Report Status PENDING  Incomplete  Body fluid culture w Gram Stain     Status: None   Collection Time: 07/05/20  8:04 PM   Specimen: Synovium  Result Value Ref Range Status   Specimen Description SYNOVIAL  Final   Special Requests LEFT KNEE  Final   Gram Stain   Final    ABUNDANT WBC PRESENT, PREDOMINANTLY PMN NO ORGANISMS SEEN    Culture   Final    NO GROWTH 3 DAYS Performed at [facility], [place] [address], [place], [place] [address]    Report Status 07/09/2020 FINAL  Final  SARS CORONAVIRUS 2 (TAT 6-24 HRS) Nasopharyngeal Nasopharyngeal Swab     Status: None   Collection Time: 07/05/20 10:38 PM   Specimen: Nasopharyngeal Swab  Result Value Ref Range Status   SARS Coronavirus 2 NEGATIVE NEGATIVE Final    Comment: (NOTE) SARS-CoV-2 target nucleic acids are NOT DETECTED.  The SARS-CoV-2 RNA is generally detectable in upper and lower respiratory specimens during the acute phase of infection. Negative results do not preclude SARS-CoV-2 infection, do not rule out co-infections with other pathogens, and should not be used as the sole basis for treatment or other patient management decisions. Negative results must be combined with clinical observations, patient history, and epidemiological information. The expected result is Negative.  Fact Sheet for Patients: SugarRoll.be  Fact Sheet for Healthcare Providers: https://www.woods-mathews.com/  This test is not yet approved or cleared by the [place]  FDA and  has been authorized for detection and/or diagnosis of SARS-CoV-2 by FDA under an Emergency Use Authorization (EUA). This EUA will remain  in effect (meaning this test can be used) for the duration of the COVID-19 declaration under Se ction 564(b)(1) of the Act, 21 U.S.C. section 360bbb-3(b)(1), unless the authorization is terminated or revoked sooner.  Performed at [facility], [place] [address], [place], [place] [address]   Surgical pcr screen     Status: None   Collection Time: 07/06/20  1:45 AM   Specimen: Nasal Mucosa; Nasal Swab  Result Value Ref Range Status   MRSA, PCR NEGATIVE NEGATIVE Final   Staphylococcus aureus NEGATIVE NEGATIVE Final    Comment: (NOTE) The Xpert SA Assay (FDA approved for NASAL specimens in patients 65 years of age and older), is one component of a comprehensive surveillance program. It is not intended to diagnose infection nor to guide or monitor treatment. Performed at [facility], [place],  [place] [address]      Labs: Basic Metabolic Panel: Recent Labs  Lab 07/05/20 1811 07/06/20 0033 07/07/20 0206 07/08/20 0338 07/09/20 0225 07/10/20 0109  NA 132* 133* 133* 136 138  --   K 3.4* 3.7 3.4* 3.2* 4.2  --   CL 100 102 107 111 111  --   CO2 25 23 19* 20* 19*  --   GLUCOSE 106* 112* 100* 98 129*  --   BUN 11 10 18 12 11   --   CREATININE 0.63 0.63 0.63 0.51 0.47  --   CALCIUM 9.1 8.8* 8.2* 8.3* 8.8*  --   MG  --  1.8  --   --  1.6* 1.8   Liver Function Tests: Recent Labs  Lab 07/05/20 1811  AST 19  ALT 20  ALKPHOS 57  BILITOT 0.6  PROT 7.2  ALBUMIN 3.4*   No results for input(s): LIPASE, AMYLASE in the last 168 hours. No results for input(s): AMMONIA in the last 168 hours. CBC: Recent Labs  Lab 07/06/20 0033 07/07/20 0206 07/08/20 0338 07/08/20 1527 07/10/20 0109  WBC 13.5* 9.4 7.3 7.3 5.8  NEUTROABS  --   --  4.3 6.1 4.6  HGB 11.4* 9.1* 8.5* 9.3* 9.0*  HCT 36.1 28.9* 26.3* 29.7* 27.2*   MCV 98.6 100.0 97.8 96.7 92.8  PLT 226 181 206 231 281   Cardiac Enzymes: No results for input(s): CKTOTAL, CKMB, CKMBINDEX, TROPONINI in the last 168 hours. BNP: BNP (last 3 results) No results for input(s): BNP in the last 8760 hours.  ProBNP (last 3 results) No results for input(s): PROBNP in the last 8760 hours.  CBG: No results for input(s): GLUCAP in the last 168 hours.     Signed:  Florencia Reasons MD, PhD, FACP  [facility] 07/10/2020, 10:36 AM

## 2020-07-10 NOTE — Progress Notes (Signed)
Pt got up with PT this morning. Pain is controlled. Discharge instructions given to pt. Discharged to home accompanied by daughter.

## 2020-07-11 ENCOUNTER — Other Ambulatory Visit: Payer: Self-pay

## 2020-07-11 ENCOUNTER — Encounter (HOSPITAL_COMMUNITY): Payer: Self-pay | Admitting: Emergency Medicine

## 2020-07-11 ENCOUNTER — Emergency Department (HOSPITAL_COMMUNITY): Payer: PPO

## 2020-07-11 ENCOUNTER — Observation Stay (HOSPITAL_COMMUNITY)
Admission: EM | Admit: 2020-07-11 | Discharge: 2020-07-13 | Disposition: A | Discharge status: Home / Self Care | Payer: PPO | Attending: Internal Medicine | Admitting: Internal Medicine

## 2020-07-11 DIAGNOSIS — J45909 Unspecified asthma, uncomplicated: Secondary | ICD-10-CM | POA: Diagnosis not present

## 2020-07-11 DIAGNOSIS — M009 Pyogenic arthritis, unspecified: Secondary | ICD-10-CM | POA: Diagnosis present

## 2020-07-11 DIAGNOSIS — R000 Tachycardia, unspecified: Secondary | ICD-10-CM | POA: Diagnosis not present

## 2020-07-11 DIAGNOSIS — R519 Headache, unspecified: Secondary | ICD-10-CM | POA: Diagnosis not present

## 2020-07-11 DIAGNOSIS — M1120 Other chondrocalcinosis, unspecified site: Secondary | ICD-10-CM | POA: Diagnosis not present

## 2020-07-11 DIAGNOSIS — I609 Nontraumatic subarachnoid hemorrhage, unspecified: Secondary | ICD-10-CM

## 2020-07-11 DIAGNOSIS — T1490XA Injury, unspecified, initial encounter: Secondary | ICD-10-CM

## 2020-07-11 DIAGNOSIS — S066X0A Traumatic subarachnoid hemorrhage without loss of consciousness, initial encounter: Secondary | ICD-10-CM | POA: Diagnosis not present

## 2020-07-11 DIAGNOSIS — W182XXA Fall in (into) shower or empty bathtub, initial encounter: Secondary | ICD-10-CM | POA: Diagnosis not present

## 2020-07-11 DIAGNOSIS — S0990XA Unspecified injury of head, initial encounter: Secondary | ICD-10-CM | POA: Diagnosis present

## 2020-07-11 DIAGNOSIS — I495 Sick sinus syndrome: Secondary | ICD-10-CM | POA: Diagnosis not present

## 2020-07-11 DIAGNOSIS — I10 Essential (primary) hypertension: Secondary | ICD-10-CM | POA: Diagnosis present

## 2020-07-11 DIAGNOSIS — Z79899 Other long term (current) drug therapy: Secondary | ICD-10-CM | POA: Diagnosis not present

## 2020-07-11 DIAGNOSIS — Y92002 Bathroom of unspecified non-institutional (private) residence single-family (private) house as the place of occurrence of the external cause: Secondary | ICD-10-CM | POA: Insufficient documentation

## 2020-07-11 DIAGNOSIS — S06310A Contusion and laceration of right cerebrum without loss of consciousness, initial encounter: Secondary | ICD-10-CM | POA: Diagnosis not present

## 2020-07-11 DIAGNOSIS — Y900 Blood alcohol level of less than 20 mg/100 ml: Secondary | ICD-10-CM | POA: Insufficient documentation

## 2020-07-11 DIAGNOSIS — Z96651 Presence of right artificial knee joint: Secondary | ICD-10-CM | POA: Insufficient documentation

## 2020-07-11 DIAGNOSIS — S20211A Contusion of right front wall of thorax, initial encounter: Secondary | ICD-10-CM | POA: Insufficient documentation

## 2020-07-11 DIAGNOSIS — S60211A Contusion of right wrist, initial encounter: Secondary | ICD-10-CM | POA: Insufficient documentation

## 2020-07-11 DIAGNOSIS — Z20822 Contact with and (suspected) exposure to covid-19: Secondary | ICD-10-CM | POA: Insufficient documentation

## 2020-07-11 DIAGNOSIS — I82409 Acute embolism and thrombosis of unspecified deep veins of unspecified lower extremity: Secondary | ICD-10-CM | POA: Diagnosis present

## 2020-07-11 DIAGNOSIS — Z043 Encounter for examination and observation following other accident: Secondary | ICD-10-CM | POA: Diagnosis not present

## 2020-07-11 DIAGNOSIS — Z7901 Long term (current) use of anticoagulants: Secondary | ICD-10-CM | POA: Insufficient documentation

## 2020-07-11 LAB — I-STAT CHEM 8, ED
BUN: 19 mg/dL (ref 8–23)
Calcium, Ion: 1.15 mmol/L (ref 1.15–1.40)
Chloride: 101 mmol/L (ref 98–111)
Creatinine, Ser: 0.6 mg/dL (ref 0.44–1.00)
Glucose, Bld: 124 mg/dL — ABNORMAL HIGH (ref 70–99)
HCT: 37 % (ref 36.0–46.0)
Hemoglobin: 12.6 g/dL (ref 12.0–15.0)
Potassium: 3.8 mmol/L (ref 3.5–5.1)
Sodium: 139 mmol/L (ref 135–145)
TCO2: 26 mmol/L (ref 22–32)

## 2020-07-11 LAB — CBC
HCT: 36.3 % (ref 36.0–46.0)
Hemoglobin: 11.4 g/dL — ABNORMAL LOW (ref 12.0–15.0)
MCH: 30.3 pg (ref 26.0–34.0)
MCHC: 31.4 g/dL (ref 30.0–36.0)
MCV: 96.5 fL (ref 80.0–100.0)
Platelets: 503 10*3/uL — ABNORMAL HIGH (ref 150–400)
RBC: 3.76 MIL/uL — ABNORMAL LOW (ref 3.87–5.11)
RDW: 14.9 % (ref 11.5–15.5)
WBC: 14.8 10*3/uL — ABNORMAL HIGH (ref 4.0–10.5)
nRBC: 0 % (ref 0.0–0.2)

## 2020-07-11 NOTE — ED Notes (Signed)
CT not ready for pt; will call back

## 2020-07-11 NOTE — ED Triage Notes (Signed)
Emergency Medicine Provider Triage Evaluation Note  KATHLEN SAKURAI , a 87 y.o. female  was evaluated in triage.  Pt complains of fall in the shower.  She takes eliquis.  Last dose this morning.  Pain in right side of face and head.  Pain in right sided chest.  Review of Systems  Positive: Fall, takes blood thinners Negative: LOC  Physical Exam  There were no vitals taken for this visit. Gen:   Awake, no distress   HEENT:  Right sided facial and forehead contusion Resp:  Normal effort  Cardiac:  Normal rate  Abd:   Nondistended, nontender  MSK:   Moves extremities without difficulty  Neuro:  Speech clear   Medical Decision Making  Medically screening exam initiated at 11:24 PM.  Appropriate orders placed.  Shakeisha Horine Kwiatek was informed that the remainder of the evaluation will be completed by another provider, this initial triage assessment does not replace that evaluation, and the importance of remaining in the ED until their evaluation is complete.  Patient activated as a level 2 trauma, taken back to trauma room immediately.   Clinical Impression  Fall on eliquis.    Lorin Glass, [place] 07/11/20 2326

## 2020-07-11 NOTE — ED Triage Notes (Signed)
Patient lost her balance and fell in the bathroom this evening , hit her head on the floor , she is taking Eliquis , no LOC , presents with swelling/bruise at right forehead , pain at right lateral ribcage and right hand . Alert and oriented,respirations unlabored , Level 2 trauma activated.

## 2020-07-12 ENCOUNTER — Telehealth: Payer: Self-pay | Admitting: *Deleted

## 2020-07-12 ENCOUNTER — Other Ambulatory Visit: Payer: Self-pay | Admitting: *Deleted

## 2020-07-12 ENCOUNTER — Emergency Department (HOSPITAL_COMMUNITY): Payer: PPO

## 2020-07-12 ENCOUNTER — Observation Stay (HOSPITAL_COMMUNITY): Payer: PPO

## 2020-07-12 DIAGNOSIS — I495 Sick sinus syndrome: Secondary | ICD-10-CM | POA: Diagnosis present

## 2020-07-12 DIAGNOSIS — I10 Essential (primary) hypertension: Secondary | ICD-10-CM

## 2020-07-12 DIAGNOSIS — M1120 Other chondrocalcinosis, unspecified site: Secondary | ICD-10-CM | POA: Diagnosis not present

## 2020-07-12 DIAGNOSIS — I609 Nontraumatic subarachnoid hemorrhage, unspecified: Secondary | ICD-10-CM

## 2020-07-12 DIAGNOSIS — I82402 Acute embolism and thrombosis of unspecified deep veins of left lower extremity: Secondary | ICD-10-CM

## 2020-07-12 DIAGNOSIS — R008 Other abnormalities of heart beat: Secondary | ICD-10-CM

## 2020-07-12 DIAGNOSIS — S06310A Contusion and laceration of right cerebrum without loss of consciousness, initial encounter: Secondary | ICD-10-CM

## 2020-07-12 DIAGNOSIS — Z043 Encounter for examination and observation following other accident: Secondary | ICD-10-CM | POA: Diagnosis not present

## 2020-07-12 DIAGNOSIS — R519 Headache, unspecified: Secondary | ICD-10-CM | POA: Diagnosis not present

## 2020-07-12 DIAGNOSIS — S066X0A Traumatic subarachnoid hemorrhage without loss of consciousness, initial encounter: Secondary | ICD-10-CM | POA: Diagnosis not present

## 2020-07-12 LAB — RESP PANEL BY RT-PCR (FLU A&B, COVID) ARPGX2
Influenza A by PCR: NEGATIVE
Influenza B by PCR: NEGATIVE
SARS Coronavirus 2 by RT PCR: NEGATIVE

## 2020-07-12 LAB — COMPREHENSIVE METABOLIC PANEL
ALT: 126 U/L — ABNORMAL HIGH (ref 0–44)
AST: 64 U/L — ABNORMAL HIGH (ref 15–41)
Albumin: 3.2 g/dL — ABNORMAL LOW (ref 3.5–5.0)
Alkaline Phosphatase: 52 U/L (ref 38–126)
Anion gap: 9 (ref 5–15)
BUN: 18 mg/dL (ref 8–23)
CO2: 27 mmol/L (ref 22–32)
Calcium: 9.1 mg/dL (ref 8.9–10.3)
Chloride: 103 mmol/L (ref 98–111)
Creatinine, Ser: 0.65 mg/dL (ref 0.44–1.00)
GFR, Estimated: >60 mL/min (ref >60)
Glucose, Bld: 126 mg/dL — ABNORMAL HIGH (ref 70–99)
Potassium: 3.9 mmol/L (ref 3.5–5.1)
Sodium: 139 mmol/L (ref 135–145)
Total Bilirubin: 0.3 mg/dL (ref 0.3–1.2)
Total Protein: 7.2 g/dL (ref 6.5–8.1)

## 2020-07-12 LAB — BASIC METABOLIC PANEL
Anion gap: 6 (ref 5–15)
BUN: 16 mg/dL (ref 8–23)
CO2: 26 mmol/L (ref 22–32)
Calcium: 8.3 mg/dL — ABNORMAL LOW (ref 8.9–10.3)
Chloride: 104 mmol/L (ref 98–111)
Creatinine, Ser: 0.56 mg/dL (ref 0.44–1.00)
GFR, Estimated: >60 mL/min (ref >60)
Glucose, Bld: 99 mg/dL (ref 70–99)
Potassium: 3.4 mmol/L — ABNORMAL LOW (ref 3.5–5.1)
Sodium: 136 mmol/L (ref 135–145)

## 2020-07-12 LAB — URINALYSIS, ROUTINE W REFLEX MICROSCOPIC
Bilirubin Urine: NEGATIVE
Glucose, UA: NEGATIVE mg/dL
Hgb urine dipstick: NEGATIVE
Ketones, ur: NEGATIVE mg/dL
Leukocytes,Ua: NEGATIVE
Nitrite: NEGATIVE
Protein, ur: NEGATIVE mg/dL
Specific Gravity, Urine: 1.009 (ref 1.005–1.030)
pH: 5 (ref 5.0–8.0)

## 2020-07-12 LAB — PROTIME-INR
INR: 1.1 (ref 0.8–1.2)
Prothrombin Time: 14.5 seconds (ref 11.4–15.2)

## 2020-07-12 LAB — CBC
HCT: 31.2 % — ABNORMAL LOW (ref 36.0–46.0)
Hemoglobin: 9.9 g/dL — ABNORMAL LOW (ref 12.0–15.0)
MCH: 30 pg (ref 26.0–34.0)
MCHC: 31.7 g/dL (ref 30.0–36.0)
MCV: 94.5 fL (ref 80.0–100.0)
Platelets: 446 10*3/uL — ABNORMAL HIGH (ref 150–400)
RBC: 3.3 MIL/uL — ABNORMAL LOW (ref 3.87–5.11)
RDW: 14.9 % (ref 11.5–15.5)
WBC: 11.8 10*3/uL — ABNORMAL HIGH (ref 4.0–10.5)
nRBC: 0 % (ref 0.0–0.2)

## 2020-07-12 LAB — SAMPLE TO BLOOD BANK

## 2020-07-12 LAB — ETHANOL: Alcohol, Ethyl (B): <10 mg/dL (ref <10)

## 2020-07-12 LAB — LACTIC ACID, PLASMA: Lactic Acid, Venous: 2.9 mmol/L (ref 0.5–1.9)

## 2020-07-12 LAB — MAGNESIUM: Magnesium: 1.9 mg/dL (ref 1.7–2.4)

## 2020-07-12 MED ORDER — MONTELUKAST SODIUM 10 MG PO TABS
10.0000 mg | ORAL_TABLET | Freq: Every day | ORAL | Status: DC
  Administered 2020-07-12 (×2): 10 mg via ORAL
  Filled 2020-07-12 (×3): qty 1

## 2020-07-12 MED ORDER — GABAPENTIN 300 MG PO CAPS
300.0000 mg | ORAL_CAPSULE | Freq: Every day | ORAL | Status: DC
  Administered 2020-07-12 (×2): 300 mg via ORAL
  Filled 2020-07-12 (×2): qty 1

## 2020-07-12 MED ORDER — PREDNISONE 5 MG PO TABS: 10.0000 mg | ORAL_TABLET | Freq: Every day | ORAL | Status: DC

## 2020-07-12 MED ORDER — VITAMIN D (ERGOCALCIFEROL) 1.25 MG (50000 UNIT) PO CAPS: 50000.0000 [IU] | ORAL_CAPSULE | ORAL | Status: DC

## 2020-07-12 MED ORDER — AMITRIPTYLINE HCL 25 MG PO TABS
100.0000 mg | ORAL_TABLET | Freq: Every day | ORAL | Status: DC
  Administered 2020-07-12 – 2020-07-13 (×2): 100 mg via ORAL
  Filled 2020-07-12 (×3): qty 4

## 2020-07-12 MED ORDER — DOCUSATE SODIUM 100 MG PO CAPS: 100.0000 mg | ORAL_CAPSULE | Freq: Every day | ORAL | Status: DC | PRN

## 2020-07-12 MED ORDER — PREDNISONE 5 MG PO TABS: 5.0000 mg | ORAL_TABLET | Freq: Every day | ORAL | Status: DC

## 2020-07-12 MED ORDER — DIPHENOXYLATE-ATROPINE 2.5-0.025 MG PO TABS
1.0000 | ORAL_TABLET | Freq: Three times a day (TID) | ORAL | Status: DC | PRN
  Administered 2020-07-12: 1 via ORAL
  Filled 2020-07-12: qty 1

## 2020-07-12 MED ORDER — TAB-A-VITE/IRON PO TABS
1.0000 | ORAL_TABLET | Freq: Two times a day (BID) | ORAL | Status: DC
  Administered 2020-07-12: 1 via ORAL
  Filled 2020-07-12 (×6): qty 1

## 2020-07-12 MED ORDER — METHOCARBAMOL 500 MG PO TABS: 500.0000 mg | ORAL_TABLET | Freq: Three times a day (TID) | ORAL | Status: DC | PRN

## 2020-07-12 MED ORDER — MAGNESIUM OXIDE 400 (241.3 MG) MG PO TABS
400.0000 mg | ORAL_TABLET | Freq: Two times a day (BID) | ORAL | Status: DC
  Administered 2020-07-12 – 2020-07-13 (×3): 400 mg via ORAL
  Filled 2020-07-12 (×3): qty 1

## 2020-07-12 MED ORDER — SODIUM CHLORIDE 0.9 % IV BOLUS
500.0000 mL | Freq: Once | INTRAVENOUS | Status: AC
  Administered 2020-07-12: 500 mL via INTRAVENOUS

## 2020-07-12 MED ORDER — ALBUTEROL SULFATE HFA 108 (90 BASE) MCG/ACT IN AERS
1.0000 | INHALATION_SPRAY | Freq: Four times a day (QID) | RESPIRATORY_TRACT | Status: DC | PRN
  Filled 2020-07-12: qty 6.7

## 2020-07-12 MED ORDER — POTASSIUM CHLORIDE CRYS ER 20 MEQ PO TBCR: 20.0000 meq | EXTENDED_RELEASE_TABLET | Freq: Every day | ORAL | Status: DC

## 2020-07-12 MED ORDER — ALPRAZOLAM 0.25 MG PO TABS
0.5000 mg | ORAL_TABLET | Freq: Every evening | ORAL | Status: DC | PRN
Start: 2020-07-12 — End: 2020-07-13
  Administered 2020-07-12 (×2): 0.5 mg via ORAL
  Filled 2020-07-12: qty 4
  Filled 2020-07-12 (×2): qty 2

## 2020-07-12 MED ORDER — LUBIPROSTONE 24 MCG PO CAPS: 24.0000 ug | ORAL_CAPSULE | Freq: Every day | ORAL | Status: DC | PRN

## 2020-07-12 MED ORDER — TAMSULOSIN HCL 0.4 MG PO CAPS
0.4000 mg | ORAL_CAPSULE | Freq: Every day | ORAL | Status: DC
  Filled 2020-07-12: qty 1

## 2020-07-12 MED ORDER — OXYCODONE HCL 5 MG PO TABS
5.0000 mg | ORAL_TABLET | Freq: Four times a day (QID) | ORAL | Status: DC | PRN
  Administered 2020-07-12: 5 mg via ORAL
  Filled 2020-07-12: qty 1

## 2020-07-12 MED ORDER — LEVOCETIRIZINE DIHYDROCHLORIDE 5 MG PO TABS: 5.0000 mg | ORAL_TABLET | Freq: Every evening | ORAL | Status: DC

## 2020-07-12 MED ORDER — BENZONATATE 100 MG PO CAPS: 100.0000 mg | ORAL_CAPSULE | Freq: Three times a day (TID) | ORAL | Status: DC | PRN

## 2020-07-12 MED ORDER — DICYCLOMINE HCL 10 MG PO CAPS
10.0000 mg | ORAL_CAPSULE | Freq: Three times a day (TID) | ORAL | Status: DC | PRN
  Filled 2020-07-12: qty 1

## 2020-07-12 MED ORDER — PANTOPRAZOLE SODIUM 40 MG PO TBEC
40.0000 mg | DELAYED_RELEASE_TABLET | Freq: Every day | ORAL | Status: DC
  Administered 2020-07-12 – 2020-07-13 (×2): 40 mg via ORAL
  Filled 2020-07-12 (×2): qty 1

## 2020-07-12 MED ORDER — LORATADINE 10 MG PO TABS
10.0000 mg | ORAL_TABLET | Freq: Every day | ORAL | Status: DC
  Administered 2020-07-12 – 2020-07-13 (×2): 10 mg via ORAL
  Filled 2020-07-12 (×3): qty 1

## 2020-07-12 MED ORDER — MOMETASONE FURO-FORMOTEROL FUM 200-5 MCG/ACT IN AERO
2.0000 | INHALATION_SPRAY | Freq: Two times a day (BID) | RESPIRATORY_TRACT | Status: DC
  Administered 2020-07-12 – 2020-07-13 (×3): 2 via RESPIRATORY_TRACT
  Filled 2020-07-12 (×2): qty 8.8

## 2020-07-12 MED ORDER — FLUTICASONE PROPIONATE 50 MCG/ACT NA SUSP
2.0000 | Freq: Every day | NASAL | Status: DC
  Filled 2020-07-12 (×2): qty 16

## 2020-07-12 MED ORDER — PREDNISONE 20 MG PO TABS
20.0000 mg | ORAL_TABLET | Freq: Every day | ORAL | Status: AC
  Administered 2020-07-12 – 2020-07-13 (×2): 20 mg via ORAL
  Filled 2020-07-12 (×2): qty 1

## 2020-07-12 MED ORDER — VALACYCLOVIR HCL 500 MG PO TABS: 500.0000 mg | ORAL_TABLET | Freq: Every day | ORAL | Status: DC | PRN

## 2020-07-12 MED ORDER — ONDANSETRON HCL 4 MG/2ML IJ SOLN: 4.0000 mg | Freq: Four times a day (QID) | INTRAMUSCULAR | Status: DC | PRN

## 2020-07-12 MED ORDER — HYDROXYZINE HCL 25 MG PO TABS: 25.0000 mg | ORAL_TABLET | Freq: Three times a day (TID) | ORAL | Status: DC | PRN

## 2020-07-12 MED ORDER — ACETAMINOPHEN 325 MG PO TABS: 650.0000 mg | ORAL_TABLET | Freq: Four times a day (QID) | ORAL | Status: DC | PRN

## 2020-07-12 MED ORDER — POTASSIUM CHLORIDE CRYS ER 20 MEQ PO TBCR
40.0000 meq | EXTENDED_RELEASE_TABLET | Freq: Once | ORAL | Status: AC
  Administered 2020-07-12: 40 meq via ORAL
  Filled 2020-07-12: qty 2

## 2020-07-12 MED ORDER — ONDANSETRON HCL 4 MG PO TABS: 4.0000 mg | ORAL_TABLET | Freq: Four times a day (QID) | ORAL | Status: DC | PRN

## 2020-07-12 MED ORDER — ACETAMINOPHEN 500 MG PO TABS
1000.0000 mg | ORAL_TABLET | Freq: Three times a day (TID) | ORAL | Status: DC
  Administered 2020-07-12 – 2020-07-13 (×5): 1000 mg via ORAL
  Filled 2020-07-12 (×5): qty 2

## 2020-07-12 MED ORDER — ACETAMINOPHEN 650 MG RE SUPP: 650.0000 mg | Freq: Four times a day (QID) | RECTAL | Status: DC | PRN

## 2020-07-12 NOTE — Progress Notes (Signed)
Pt admitted to unit, vitals taken, assessment completed, pt resting comfortably.

## 2020-07-12 NOTE — Telephone Encounter (Signed)
Received call from Dr. Harrington Challenger. Pt at ER being evaluated after a fall.  Noted to have ectopy on monitor.  Dr. Harrington Challenger ordered 4 week event monitor with plan to see in office afterwards.  Reviewed with monitor tech, may be able to be arranged for today to be placed at the office.  Called patient.  She is unsure when she will be discharged.  We will have the monitor mailed to her home and then schedule an office visit with Dr. Harrington Challenger for after that.  Pt voices understanding and agreement.

## 2020-07-12 NOTE — TOC CAGE-AID Note (Signed)
Transition of Care [facility]) - CAGE-AID Screening   Patient Details  Name: Alexis Olson MRN: [id number] Date of Birth: May 04, 1933     Elvina Sidle, RN Trauma Response Nurse Phone Number: 07/12/2020, 12:15 PM       CAGE-AID Screening:    Have You Ever Felt You Ought to Cut Down on Your Drinking or Drug Use?: No Have People Annoyed You By [employer] Your Drinking Or Drug Use?: No Have You Felt Bad Or Guilty About Your Drinking Or Drug Use?: No Have You Ever Had a Drink or Used Drugs First Thing In The Morning to Steady Your Nerves or to Get Rid of a Hangover?: No CAGE-AID Score: 0  Substance Abuse Education Offered: No (Pt denies any alcohol use)

## 2020-07-12 NOTE — Evaluation (Signed)
Occupational Therapy Evaluation Patient Details Name: Alexis Olson MRN: [id number] DOB: 18-Jun-1933 Today's Date: 07/12/2020    History of Present Illness Pt is an 87 y/o female admitted 4/17 following fall in shower. Found to have trace R frontal SAH. Pt with recent admission secondary to L knee pain. PMH includes HTN and DVT.   Clinical Impression   PTA. Pt was living with her husband and was performing BADLs and using RW; unsure of reliability of information as pt with decreased processing and perseverating on certain topics. Pt currently requiring Min A for UB ADLs, Max A for LB ADLs, and Min A +2 for functional transfer. Pt presenting with decreased balance, strength, cognition, and activity tolerance. Also noting bruising and swelling at R wrist; notified RN. Pt would benefit from further acute OT to facilitate safe dc. Recommend dc to SNF for further OT to optimize safety, independence with ADLs, and return to PLOF. However, pt adamant that she wants to return home and will require 24/7 supervision, [place], [place], and aide.    Follow Up Recommendations  SNF    Equipment Recommendations  None recommended by OT    Recommendations for Other Services PT consult     Precautions / Restrictions Precautions Precautions: Fall Precaution Comments: had a fall at home before coming in Restrictions Weight Bearing Restrictions: No      Mobility Bed Mobility Overal bed mobility: Needs Assistance Bed Mobility: Supine to Sit;Sit to Supine     Supine to sit: Min assist Sit to supine: Mod assist;+2 for safety/equipment;+2 for physical assistance   General bed mobility comments: Min A for trunk elevation to come to sitting. Increased time required. Mod A +2 for LE and trunk assist to return to supine on higher stretcher.    Transfers Overall transfer level: Needs assistance Equipment used: 1 person hand held assist Transfers: Sit to/from Stand Sit to Stand: Min assist;+2 physical  assistance         General transfer comment: Min A +2 from higher stretcher height. Did not put weight through R wrist as it was bruised and swollen.    Balance Overall balance assessment: Needs assistance Sitting-balance support: No upper extremity supported Sitting balance-Leahy Scale: Fair     Standing balance support: Single extremity supported Standing balance-Leahy Scale: Poor Standing balance comment: external assist and LUE support to maintain standing balance.                           ADL either performed or assessed with clinical judgement   ADL Overall ADL's : Needs assistance/impaired Eating/Feeding: Set up;Bed level   Grooming: Set up;Bed level   Upper Body Bathing: Minimal assistance;Sitting   Lower Body Bathing: Moderate assistance;Sit to/from stand   Upper Body Dressing : Minimal assistance;Sitting   Lower Body Dressing: Maximal assistance;Sit to/from stand   Toilet Transfer: Minimal assistance;+2 for physical assistance Toilet Transfer Details (indicate cue type and reason): sit<>stand at EOB and then taking side steps towards [facility]         Functional mobility during ADLs: Minimal assistance;+2 for physical assistance (short distance) General ADL Comments: Pt presenting with decreased balance, cognition, and actvity tolerance     Vision Baseline Vision/History: Wears glasses Patient Visual Report: No change from baseline       Perception     Praxis      Pertinent Vitals/Pain Pain Assessment: Faces Faces Pain Scale: Hurts even more Pain Location: R rib area and R wrist  Pain Descriptors / Indicators: Grimacing;Guarding Pain Intervention(s): Monitored during session;Limited activity within patient's tolerance;Repositioned     Hand Dominance Right   Extremity/Trunk Assessment Upper Extremity Assessment Upper Extremity Assessment: RUE deficits/detail RUE Deficits / Details: bruising and edema at R wrist (no current imaging).  Reporting pain at shoulder and axilary area. RUE Coordination: decreased fine motor;decreased gross motor   Lower Extremity Assessment Lower Extremity Assessment: Defer to PT evaluation LLE Deficits / Details: Reports improvement in LLE pain. was able to perform heel slide   Cervical / Trunk Assessment Cervical / Trunk Assessment: Kyphotic   Communication Communication Communication: No difficulties   Cognition Arousal/Alertness: Awake/alert Behavior During Therapy: Flat affect Overall Cognitive Status: Impaired/Different from baseline Area of Impairment: Problem solving;Safety/judgement                         Safety/Judgement: Decreased awareness of safety;Decreased awareness of deficits   Problem Solving: Slow processing General Comments: Pt with slow processing and decreased awareness of safety. Very fixated on getting her "modal" medication for diarrhea.   General Comments  No family present    Exercises     Shoulder Instructions      Home Living Family/patient expects to be discharged to:: Private residence Living Arrangements: Spouse/significant other Available Help at Discharge: Family Type of Home: House Home Access: Stairs to enter Technical brewer of Steps: 4-5 Entrance Stairs-Rails: Left Home Layout: One level     Bathroom Shower/Tub: Occupational psychologist: Handicapped height Bathroom Accessibility: Yes   Home Equipment: Grab bars - tub/shower;Bedside commode;Walker - 2 wheels;Cane - single point;Wheelchair - manual          Prior Functioning/Environment Level of Independence: Independent with assistive device(s)        Comments: Pt reporting she was independent with RW since going home, however, unsure of accuracy as per previous notes, pt was requiring increased assist for mobility tasks.        OT Problem List: Decreased strength;Pain;Decreased safety awareness;Decreased activity tolerance;Decreased knowledge of  use of DME or AE      OT Treatment/Interventions: Self-care/ADL training;DME and/or AE instruction;Therapeutic activities;Balance training;Patient/family education    OT Goals(Current goals can be found in the care plan section) Acute Rehab OT Goals Patient Stated Goal: go home OT Goal Formulation: With patient Time For Goal Achievement: 07/26/20 Potential to Achieve Goals: Good ADL Goals Pt Will Perform Upper Body Dressing: with set-up;with supervision;sitting Pt Will Transfer to Toilet: with min guard assist;ambulating;bedside commode Pt Will Perform Toileting - Clothing Manipulation and hygiene: with min guard assist;sit to/from stand;sitting/lateral leans Additional ADL Goal #1: Pt will perform bed mobility with supervision in preparation for ADLs  OT Frequency: Min 2X/week   Barriers to D/C:            Co-evaluation PT/OT/SLP Co-Evaluation/Treatment: Yes Reason for Co-Treatment: For patient/therapist safety;To address functional/ADL transfers PT goals addressed during session: Balance;Mobility/safety with mobility OT goals addressed during session: ADL's and self-care      AM-PAC OT "6 Clicks" Daily Activity     Outcome Measure Help from another person eating meals?: None Help from another person taking care of personal grooming?: A Little Help from another person toileting, which includes using toliet, bedpan, or urinal?: A Lot Help from another person bathing (including washing, rinsing, drying)?: A Lot Help from another person to put on and taking off regular upper body clothing?: A Little Help from another person to put on and taking off regular  lower body clothing?: A Lot 6 Click Score: 16   End of Session Nurse Communication: Mobility status  Activity Tolerance: Patient tolerated treatment well Patient left: in bed;with call bell/phone within reach  OT Visit Diagnosis: Unsteadiness on feet (R26.81);Other abnormalities of gait and mobility (R26.89);Muscle  weakness (generalized) (M62.81);Pain Pain - Right/Left: Right Pain - part of body: Arm                Time: 5009-3818 OT Time Calculation (min): 14 min Charges:  OT General Charges $OT Visit: 1 Visit OT Evaluation $OT Eval Moderate Complexity: [place], OTR/L Acute Rehab Pager: [phone] Office: [place] 07/12/2020, 2:48 PM

## 2020-07-12 NOTE — Evaluation (Signed)
Physical Therapy Evaluation Patient Details Name: Alexis Olson MRN: [id number] DOB: 03/24/1934 Today's Date: 07/12/2020   History of Present Illness  Pt is an 87 y/o female admitted 4/17 following fall in shower. Found to have trace R frontal SAH. Pt with recent admission secondary to L knee pain. PMH includes HTN and DVT.  Clinical Impression  Pt admitted secondary to problem above with deficits below. Pt requiring min to mod A +2 to stand and take side steps at EOB. Pt with increased difficulty taking side steps so further mobility deferred. Feel pt would benefit from SNF level therapies, however, pt adamantly refusing when discussed during session. Discussed using WC for increased safety if she decides to go home. Will continue to follow acutely.     Follow Up Recommendations SNF;Supervision/Assistance - 24 hour (max HH services if pt refuses)    Equipment Recommendations  Wheelchair (measurements PT);Wheelchair cushion (measurements PT)    Recommendations for Other Services       Precautions / Restrictions Precautions Precautions: Fall Precaution Comments: had a fall at home before coming in Restrictions Weight Bearing Restrictions: No      Mobility  Bed Mobility Overal bed mobility: Needs Assistance Bed Mobility: Supine to Sit;Sit to Supine     Supine to sit: Min assist Sit to supine: Mod assist;+2 for safety/equipment;+2 for physical assistance   General bed mobility comments: Min A for trunk elevation to come to sitting. Increased time required. Mod A +2 for LE and trunk assist to return to supine on higher stretcher.    Transfers Overall transfer level: Needs assistance Equipment used: 1 person hand held assist Transfers: Sit to/from Stand Sit to Stand: Min assist;+2 physical assistance         General transfer comment: Min A +2 from higher stretcher height. Did not put weight through R wrist as it was bruised and swollen.  Ambulation/Gait Ambulation/Gait  assistance: Min assist;Mod assist;+2 physical assistance;+2 safety/equipment           General Gait Details: Took side steps at EOB. Pt taking shuffle steps with BLE as pt unable to lift LEs to take steps.  Stairs            Wheelchair Mobility    Modified Rankin (Stroke Patients Only)       Balance Overall balance assessment: Needs assistance Sitting-balance support: No upper extremity supported Sitting balance-Leahy Scale: Fair     Standing balance support: Single extremity supported Standing balance-Leahy Scale: Poor Standing balance comment: external assist and LUE support to maintain standing balance.                             Pertinent Vitals/Pain Pain Assessment: Faces Faces Pain Scale: Hurts even more Pain Location: R rib area and R wrist Pain Descriptors / Indicators: Grimacing;Guarding Pain Intervention(s): Monitored during session;Limited activity within patient's tolerance;Repositioned    Home Living Family/patient expects to be discharged to:: Private residence Living Arrangements: Spouse/significant other Available Help at Discharge: Family Type of Home: House Home Access: Stairs to enter Entrance Stairs-Rails: Left Entrance Stairs-Number of Steps: 4-5 Home Layout: One level Home Equipment: Grab bars - tub/shower;Bedside commode;Walker - 2 wheels;Cane - single point;Wheelchair - manual      Prior Function Level of Independence: Independent with assistive device(s)         Comments: Pt reporting she was independent with RW since going home, however, unsure of accuracy as per previous notes, pt was requiring  increased assist for mobility tasks.     Hand Dominance        Extremity/Trunk Assessment   Upper Extremity Assessment Upper Extremity Assessment: Defer to OT evaluation    Lower Extremity Assessment Lower Extremity Assessment: Generalized weakness;LLE deficits/detail LLE Deficits / Details: Reports improvement in  LLE pain. was able to perform heel slide    Cervical / Trunk Assessment Cervical / Trunk Assessment: Kyphotic  Communication   Communication: No difficulties  Cognition Arousal/Alertness: Awake/alert Behavior During Therapy: Flat affect Overall Cognitive Status: Impaired/Different from baseline Area of Impairment: Problem solving;Safety/judgement                         Safety/Judgement: Decreased awareness of safety;Decreased awareness of deficits   Problem Solving: Slow processing General Comments: Pt with slow processing and decreased awareness of safety. Very fixated on getting her "modal" medication for diarrhea.      General Comments General comments (skin integrity, edema, etc.): No family present    Exercises     Assessment/Plan    PT Assessment Patient needs continued PT services  PT Problem List         PT Treatment Interventions DME instruction;Gait training;Functional mobility training;Therapeutic activities;Therapeutic exercise;Stair training;Balance training;Patient/family education;Wheelchair mobility training    PT Goals (Current goals can be found in the Care Plan section)  Acute Rehab PT Goals Patient Stated Goal: go home PT Goal Formulation: With patient Time For Goal Achievement: 08/30/20 Potential to Achieve Goals: Fair    Frequency Min 3X/week   Barriers to discharge        Co-evaluation PT/OT/SLP Co-Evaluation/Treatment: Yes Reason for Co-Treatment: For patient/therapist safety;To address functional/ADL transfers;Necessary to address cognition/behavior during functional activity PT goals addressed during session: Balance;Mobility/safety with mobility         AM-PAC PT "6 Clicks" Mobility  Outcome Measure Help needed turning from your back to your side while in a flat bed without using bedrails?: A Little Help needed moving from lying on your back to sitting on the side of a flat bed without using bedrails?: A Little Help  needed moving to and from a bed to a chair (including a wheelchair)?: A Lot Help needed standing up from a chair using your arms (e.g., wheelchair or bedside chair)?: A Lot Help needed to walk in hospital room?: Total Help needed climbing 3-5 steps with a railing? : Total 6 Click Score: 12    End of Session   Activity Tolerance: Patient limited by pain;Patient limited by fatigue Patient left: in bed;with call bell/phone within reach (on stretcher in ED) Nurse Communication: Mobility status;Other (comment) (R wrist swelling/bruising) PT Visit Diagnosis: History of falling (Z91.81);Unsteadiness on feet (R26.81);Muscle weakness (generalized) (M62.81);Difficulty in walking, not elsewhere classified (R26.2)    Time: 6195-0932 PT Time Calculation (min) (ACUTE ONLY): 16 min   Charges:   PT Evaluation $PT Eval Moderate Complexity: 1 Mod          Reuel Derby, PT, DPT  Acute Rehabilitation Services  Pager: [phone] Office: [phone]   Rudean Hitt 07/12/2020, 1:37 PM

## 2020-07-12 NOTE — H&P (Signed)
History and Physical    Alexis Olson MBW:[id number] DOB: 1933/09/06 DOA: 07/11/2020  PCP: Mosie Lukes, MD  Patient coming from: Home  I have personally briefly reviewed patient's old medical records in [place]  Chief Complaint: Fall, head injury  HPI: Alexis Olson is a 87 y.o. female with medical history significant of prior DVT in 2021 now on chronic eliquis, HTN.  Pt recently admitted to our service for knee pain and fever from 4/11 to 4/16.  Ultimately determined to be due to pseudogout on knee tap.  Pt discharged on steroid taper.  Offered SNF but pt declined this and went home.  Today fell in shower.  Hit forehead.  Bruising and pain present to forehead.  No other injuries.  No neck, back, or abd pain.  Does have mild bruising and pain to R wrist.   ED Course: questionable Tiny SAH on CT.  EDP spoke with NS.  EDP wants obs admit for repeat CT head in AM.  No syncope, no dizziness. (and never had these).   Review of Systems: As per HPI, otherwise all review of systems negative.  Past Medical History:  Diagnosis Date  . ALLERGIC RHINITIS   . ANEMIA, CHRONIC    Malabsorption related to bypass hx, B12 and iron deficiency  . ANXIETY   . Arthritis   . ASTHMA   . Bariatric surgery status   . Bowel perforation ([place]) 2016  . C. difficile diarrhea   . Colon perforation ([place]) 08/2014   following polypetomy during colo, surgical repair  . COLONIC POLYPS, ADENOMATOUS, HX OF 2010  . DIVERTICULOSIS, COLON   . DVT (deep venous thrombosis) ([place]) 2018  . DVT (deep venous thrombosis) ([place])   . GERD (gastroesophageal reflux disease) 08/23/2015  . Graded compression stocking in place   . History of blood transfusion   . History of migraine   . History of rheumatic fever   . HSV (herpes simplex virus) infection 08/16/2015  . Hyperlipidemia   . Hypertension    Denies  . Hyponatremia 05/02/2016  . Incomplete left bundle branch block (LBBB) 2016   Noted on EKG   . INSOMNIA   . Lumbosacral disc disease    Chronic pain; History of osteomyelitis 2010 following ESI complication  . Obesity   . Osteomyelitis ([place])   . Pelvic fracture ([place])   . Pneumonia   . Recurrent UTI 08/23/2015  . URINARY URGENCY   . Ventral hernia without obstruction or gangrene 05/22/2018  . VITAMIN B12 DEFICIENCY     Past Surgical History:  Procedure Laterality Date  . [place]   for ruptured disc  . CHOLECYSTECTOMY    . COLONOSCOPY    . greenfield filter    . ileojejunal bypass  1976   for obesity  . LAPAROTOMY N/A 09/18/2014   Procedure: EXPLORATORY LAPAROTOMY WITH REPAIR OF CECIAL PERFORATION;  Surgeon: Autumn Messing III, MD;  Location: [facility];  Service: General;  Laterality: N/A;  . TONSILLECTOMY    . TOTAL KNEE ARTHROPLASTY Right 08/26/2019   Procedure: RIGHT TOTAL KNEE ARTHROPLASTY, LEFT KNEE CORTISONE INJECTION;  Surgeon: Paralee Cancel, MD;  Location: [facility];  Service: Orthopedics;  Laterality: Right;  70 MINS  . UPPER GI ENDOSCOPY    . VASCULAR SURGERY Right    revascularization right lower extremity     reports that she has never smoked. She has never used smokeless tobacco. She reports that she does not drink alcohol and does not  use drugs.  Allergies  Allergen Reactions  . Ativan [Lorazepam] Anaphylaxis and Other (See Comments)    Deathly allergic; "resp rate dropped dropped to 6"  . Other Anaphylaxis    Red peppers--choking, also  . Azithromycin Diarrhea  . Benadryl [Diphenhydramine Hcl] Other (See Comments)    Paradoxical response  . Ciprofloxacin Hcl Other (See Comments)    Causes yeast infection and refuses to take  . Codeine Phosphate Nausea Only    Can tolerate in limited amounts  . Hydrocodone Itching and Nausea And Vomiting  . Hydrocodone-Homatropine Itching and Nausea And Vomiting  . Levaquin [Levofloxacin In D5w] Other (See Comments)    Muscle soreness  . Levofloxacin   . Meperidine Hcl Other (See Comments)    Reaction ??  . Molds  & Smuts Other (See Comments)    unsure  . Sulfa Antibiotics Other (See Comments)    Joint pain  . Tape Other (See Comments)    No Coban wrap (per the patient)  . Tramadol   . Ultram [Tramadol Hcl] Other (See Comments)    "Can't move joints"    Family History  Problem Relation Age of Onset  . Cervical cancer Mother   . Stroke Mother   . Liver disease Sister   . Kidney disease Sister   . Cirrhosis Sister   . Colon cancer Neg Hx   . Esophageal cancer Neg Hx   . Rectal cancer Neg Hx   . Stomach cancer Neg Hx      Prior to Admission medications   Medication Sig Start Date End Date Taking? Authorizing Provider  acetaminophen (TYLENOL) 500 MG tablet Take 2 tablets (1,000 mg total) by mouth every 8 (eight) hours. 08/27/19   Maurice March, PA-C  albuterol (PROVENTIL HFA;VENTOLIN HFA) 108 (90 BASE) MCG/ACT inhaler Inhale 1-2 puffs into the lungs every 6 (six) hours as needed for wheezing or shortness of breath.    [provider]  ALPRAZolam Duanne Moron) 1 MG tablet Take 0.5-1 tablets (0.5-1 mg total) by mouth at bedtime as needed. Patient taking differently: Take 0.5-1 mg by mouth at bedtime as needed for anxiety or sleep. 11/20/19   Mosie Lukes, MD  amitriptyline (ELAVIL) 100 MG tablet TAKE 1 TABLET (100 MG TOTAL) BY MOUTH DAILY. Patient taking differently: Take 100 mg by mouth daily. 03/29/20 03/29/21  Mosie Lukes, MD  apixaban (ELIQUIS) 5 MG TABS tablet TAKE 1 TABLET BY MOUTH 2 TIMES DAILY Patient taking differently: Take 5 mg by mouth 2 (two) times daily. 05/24/20 05/24/21  Mosie Lukes, MD  azelastine (OPTIVAR) 0.05 % ophthalmic solution Place 1 drop into both eyes daily as needed (for dry eyes).    [provider]  benzonatate (TESSALON) 100 MG capsule TAKE 1 CAPSULE BY MOUTH 3 TIMES DAILY AS NEEDED FOR COUGH 05/06/20 05/06/21  Mosie Lukes, MD  budesonide-formoterol [facility]) 160-4.5 MCG/ACT inhaler Inhale 2 puffs into the lungs See admin instructions. Inhale  2 puffs into the lungs one to two times a day 03/04/18   Saguier, Percell Miller, PA-C  cetirizine (ZYRTEC) 10 MG tablet Take 10 mg by mouth daily.  02/05/14   [provider]  cyanocobalamin (,VITAMIN B-12,) 1000 MCG/ML injection INJECT 1CC INTO THE MUSCLE EVERY MONTH Patient taking differently: Inject 1,000 mcg into the muscle every 30 (thirty) days. 10/08/19 10/07/20  Gatha Mayer, MD  dicyclomine (BENTYL) 10 MG capsule TAKE 1 CAPSULE BY MOUTH 3 TIMES DAILY AS NEEDED FOR DIARRHEA AND SYMPTOMS Patient taking differently:  Take 10 mg by mouth 3 (three) times daily as needed for spasms. 12/08/19   Gatha Mayer, MD  diphenoxylate-atropine (LOMOTIL) 2.5-0.025 MG tablet TAKE 2 TABLETS BY MOUTH 3 TIMES DAILY AS NEEDED FOR DIARRHEA Patient taking differently: Take 1 tablet by mouth 3 (three) times daily as needed for diarrhea or loose stools. 06/25/20 12/22/20  Gatha Mayer, MD  docusate sodium (COLACE) 100 MG capsule Take 1 capsule (100 mg total) by mouth 2 (two) times daily. Patient taking differently: Take 100 mg by mouth daily as needed for mild constipation. 08/27/19   Maurice March, PA-C  Fe Fum-FA-B Cmp-C-Zn-Mg-Mn-Cu (HEMOCYTE PLUS) 106-1 MG CAPS TAKE 1 CAPSULE BY MOUTH 2 TIMES DAILY 06/03/20 06/03/21  Gatha Mayer, MD  fluticasone [facility]) 50 MCG/ACT nasal spray Place 2 sprays into the nose daily.    [provider]  gabapentin (NEURONTIN) 300 MG capsule TAKE 1 CAPSULE BY MOUTH AT BEDTIME 02/21/20 02/20/21  Mosie Lukes, MD  hydrOXYzine (ATARAX) 25 MG tablet Take 25 mg by mouth 3 (three) times daily as needed for itching.    [provider]  levocetirizine (XYZAL) 5 MG tablet Take 1 tablet (5 mg total) by mouth every evening. 08/04/14   Rowe Clack, MD  lubiprostone (AMITIZA) 24 MCG capsule Take 24 mcg by mouth daily as needed for constipation.    [provider]  magnesium oxide (MAG-OX) 400 MG tablet Take 400 mg by mouth 2 (two) times daily.     [provider]  methocarbamol (ROBAXIN) 500 MG tablet TAKE 1 TABLET BY MOUTH EVERY 8 HOURS AS NEEDED FOR MUSCLE SPASMS 06/15/20 06/15/21  Mosie Lukes, MD  montelukast (SINGULAIR) 10 MG tablet Take 10 mg by mouth daily as needed (For allergy).  02/11/14   [provider]  omeprazole (PRILOSEC) 20 MG capsule TAKE 1 CAPSULE BY MOUTH ONCE DAILY Patient taking differently: Take 20 mg by mouth daily. 06/28/20 06/28/21  Gatha Mayer, MD  ondansetron (ZOFRAN-ODT) 4 MG disintegrating tablet Take 1 tablet (4 mg total) by mouth 3 (three) times daily as needed for nausea or vomiting. 08/27/19   Maurice March, PA-C  oxyCODONE (OXY IR/ROXICODONE) 5 MG immediate release tablet Take 1 tablet (5 mg total) by mouth every 6 (six) hours as needed for severe pain. 07/10/20   Florencia Reasons, MD  potassium chloride SA (KLOR-CON) 20 MEQ tablet TAKE 1 TABLET BY MOUTH ONCE A DAY Patient taking differently: Take 20 mEq by mouth daily. 03/24/20 03/24/21  Mosie Lukes, MD  predniSONE (DELTASONE) 5 MG tablet Take 20mg  (4tabs) daily with breakfast on 4/17, 4/18 and 4/19, then take 10mg  (2tabs) daily on 4/20, 4/21 and 4/22, then take 5mg  (1tab) daily on 4/23, 4/24, 4/25, then stop. 07/10/20   Florencia Reasons, MD  Probiotic Product (PROBIOTIC DAILY PO) Take 1 capsule by mouth daily.    [provider]  tamsulosin (FLOMAX) 0.4 MG CAPS capsule Take 1 capsule (0.4 mg total) by mouth daily after supper. 11/16/19   Allie Bossier, MD  valACYclovir (VALTREX) 500 MG tablet TAKE 1 TABLET BY MOUTH ONCE A DAY AS NEEDED. Patient taking differently: Take 500 mg by mouth daily as needed (For outbreaks). 01/21/20 01/20/21  Mosie Lukes, MD  Vitamin D, Ergocalciferol, (DRISDOL) 1.25 MG (50000 UNIT) CAPS capsule TAKE 1 CAPSULE BY MOUTH EVERY 7 DAYS Patient taking differently: Take 50,000 Units by mouth every 7 (seven) days. 09/25/19 09/24/20  Mosie Lukes, MD  zolpidem Lorrin Mais) 5  MG tablet TAKE 1 TABLET BY MOUTH AT  BEDTIME Patient taking differently: Take 5 mg by mouth at bedtime. 04/16/20 10/13/20  Mosie Lukes, MD    Physical Exam: Vitals:   07/11/20 2345 07/12/20 0000 07/12/20 0015 07/12/20 0030  BP: 136/86 122/89 (!) 129/52 133/69  Pulse: (!) 44 (!) 45 (!) 47 (!) 43  Resp: 17 14 20 12   Temp:      TempSrc:      SpO2: 98% 96% 99% 98%  Weight:      Height:        Constitutional: NAD, calm, comfortable Eyes: PERRL, lids and conjunctivae normal ENMT: Mucous membranes are moist. Posterior pharynx clear of any exudate or lesions.Normal dentition.  Neck: normal, supple, no masses, no thyromegaly Respiratory: clear to auscultation bilaterally, no wheezing, no crackles. Normal respiratory effort. No accessory muscle use.  Cardiovascular: IRR, IRR Abdomen: no tenderness, no masses palpated. No hepatosplenomegaly. Bowel sounds positive.  Musculoskeletal: no clubbing / cyanosis. No joint deformity upper and lower extremities. Good ROM, no contractures. Normal muscle tone.  Skin: no rashes, lesions, ulcers. No induration Neurologic: CN 2-12 grossly intact. Sensation intact, DTR normal. Strength 5/5 in all 4.  Psychiatric: Normal judgment and insight. Alert and oriented x 3. Normal mood.    Labs on Admission: I have personally reviewed following labs and imaging studies  CBC: Recent Labs  Lab 07/07/20 0206 07/08/20 0338 07/08/20 1527 07/10/20 0109 07/11/20 2337 07/11/20 2354  WBC 9.4 7.3 7.3 5.8 14.8*  --   NEUTROABS  --  4.3 6.1 4.6  --   --   HGB 9.1* 8.5* 9.3* 9.0* 11.4* 12.6  HCT 28.9* 26.3* 29.7* 27.2* 36.3 37.0  MCV 100.0 97.8 96.7 92.8 96.5  --   PLT 181 206 231 281 503*  --    Basic Metabolic Panel: Recent Labs  Lab 07/06/20 0033 07/07/20 0206 07/08/20 0338 07/09/20 0225 07/10/20 0109 07/11/20 2337 07/11/20 2354  NA 133* 133* 136 138  --  139 139  K 3.7 3.4* 3.2* 4.2  --  3.9 3.8  CL 102 107 111 111  --  103 101  CO2 23 19* 20* 19*  --  27  --   GLUCOSE 112* 100* 98  129*  --  126* 124*  BUN 10 18 12 11   --  18 19  CREATININE 0.63 0.63 0.51 0.47  --  0.65 0.60  CALCIUM 8.8* 8.2* 8.3* 8.8*  --  9.1  --   MG 1.8  --   --  1.6* 1.8  --   --    GFR: Estimated Creatinine Clearance: 55.4 mL/min (by C-G formula based on SCr of 0.6 mg/dL). Liver Function Tests: Recent Labs  Lab 07/05/20 1811 07/11/20 2337  AST 19 64*  ALT 20 126*  ALKPHOS 57 52  BILITOT 0.6 0.3  PROT 7.2 7.2  ALBUMIN 3.4* 3.2*   No results for input(s): LIPASE, AMYLASE in the last 168 hours. No results for input(s): AMMONIA in the last 168 hours. Coagulation Profile: Recent Labs  Lab 07/11/20 2337  INR 1.1   Cardiac Enzymes: No results for input(s): CKTOTAL, CKMB, CKMBINDEX, TROPONINI in the last 168 hours. BNP (last 3 results) No results for input(s): PROBNP in the last 8760 hours. HbA1C: No results for input(s): HGBA1C in the last 72 hours. CBG: No results for input(s): GLUCAP in the last 168 hours. Lipid Profile: No results for input(s): CHOL, HDL, LDLCALC, TRIG, CHOLHDL, LDLDIRECT in the last 72 hours. Thyroid  Function Tests: No results for input(s): TSH, T4TOTAL, FREET4, T3FREE, THYROIDAB in the last 72 hours. Anemia Panel: Recent Labs    07/09/20 0225  VITAMINB12 382  FOLATE 31.0  FERRITIN 815*  TIBC 153*  IRON 32  RETICCTPCT 1.1   Urine analysis:    Component Value Date/Time   COLORURINE YELLOW 03/31/2020 1658   APPEARANCEUR Sl Cloud[place] (A) 03/31/2020 1658   LABSPEC 1.010 03/31/2020 1658   PHURINE 5.5 03/31/2020 1658   GLUCOSEU NEGATIVE 03/31/2020 1658   HGBUR NEGATIVE 03/31/2020 1658   HGBUR trace-lysed 02/03/2010 1109   BILIRUBINUR NEGATIVE 03/31/2020 1658   BILIRUBINUR Negative 12/03/2018 [place] 03/31/2020 1658   PROTEINUR 30 (A) 11/06/2019 1638   UROBILINOGEN 0.2 03/31/2020 1658   NITRITE NEGATIVE 03/31/2020 1658   LEUKOCYTESUR SMALL (A) 03/31/2020 1658    Radiological Exams on Admission: CT Head Wo Contrast  Result  Date: 07/12/2020 CLINICAL DATA:  Fall in shower, right facial and head pain, on Eliquis EXAM: CT HEAD WITHOUT CONTRAST CT MAXILLOFACIAL WITHOUT CONTRAST CT CERVICAL SPINE WITHOUT CONTRAST TECHNIQUE: Multidetector CT imaging of the head, cervical spine, and maxillofacial structures were performed using the standard protocol without intravenous contrast. Multiplanar CT image reconstructions of the cervical spine and maxillofacial structures were also generated. COMPARISON:  CT head and cervical spine 11/06/2019 FINDINGS: CT HEAD FINDINGS Brain: Punctate hyperdensity seen along the surface of the right frontal lobe (3/19) could reflect some trace hemorrhagic contusive change. No other sites of acute intracranial hemorrhage. No evidence of acute infarction, hydrocephalus, large extra-axial collection, visible mass lesion or mass effect. Symmetric prominence of the ventricles, cisterns and sulci compatible with parenchymal volume loss. Patchy areas of white matter hypoattenuation are most compatible with chronic microvascular angiopathy. Vascular: Atherosclerotic calcification of the carotid siphons. No hyperdense vessel. Skull: Right frontal and temporal scalp swelling and thickening. No calvarial fracture or worrisome osseous lesions of the calvaria. Mild bony demineralization is noted. Other: None. CT MAXILLOFACIAL FINDINGS Osseous: No fracture of the bony orbits. Nasal bones are intact. No other mid face fractures are seen. The pterygoid plates are intact. No visible or suspected temporal bone fractures. Temporomandibular joints are normally aligned. The mandible is intact. No fractured or avulsed teeth. Orbits: Right periorbital soft tissue swelling and thickening. No retro septal gas, stranding or hemorrhage. Prior bilateral lens extractions. The globes appear otherwise normal and symmetric. Symmetric appearance of the extraocular musculature and optic nerve sheath complexes. Normal caliber of the superior  ophthalmic veins. Sinuses: Minimal nodular mural thickening in the maxillary sinuses. No layering air-fluid levels or hemosinus. Mastoid air cells are clear with partial pneumatization of the squamosal temporal bone and petrous apices. Middle ear cavities are clear. Debris noted in left external auditory canal. Soft tissues: Right periorbital and malar soft tissue swelling without soft tissue gas or foreign body. No other significant sites of facial swelling. CT CERVICAL SPINE FINDINGS Alignment: Straightening and slight reversal the normal cervical lordosis which is centered at the C4-5 level. Stepwise anterolisthesis C3-C5 is unchanged from prior and favored to be spondylitic in nature. No evidence of traumatic listhesis. No abnormally widened, perched or jumped facets. Normal alignment of the craniocervical and atlantoaxial articulations. Skull base and vertebrae: The osseous structures appear diffusely demineralized which may limit detection of small or nondisplaced fractures. No acute skull base fracture. No vertebral body fracture or height loss. Arthrosis about the atlantodental interval with some calcific pannus formation, nonspecific though can be seen in the setting of rheumatoid/CPPD arthropathy. Multilevel  cervical spondylitic changes present and similar to comparison imaging, further detailed below. Soft tissues and spinal canal: No pre or paravertebral fluid or swelling. No visible canal hematoma. Airways patent. Some mild cervical carotid atherosclerosis on the right. Disc levels: Multilevel intervertebral disc height loss with spondylitic endplate changes. Larger disc osteophyte complexes are present C3-4, C5-6 and C6-7 with effacement of ventral thecal sac. Mild canal stenosis is present at the C4-5 and C6-7 levels. Multilevel uncinate spurring facet hypertrophic changes are present as well resulting in mild-to-moderate multilevel neural foraminal narrowing as well. Upper chest: No acute abnormality  in the upper chest or imaged lung apices. Some biapical pleuroparenchymal scarring is noted. Other: No concerning thyroid nodules or masses. IMPRESSION: 1. Punctate hyperdensity along the surface of the right frontal lobe adjacent sulcus could reflect some trace hemorrhagic contusive change. No other sites of acute intracranial abnormality. 2. Right frontal and temporal scalp swelling and thickening. No calvarial fracture. 3. Right periorbital and malar soft tissue swelling without soft tissue gas or foreign body. No evidence of acute facial bone fracture. 4. No evidence of acute fracture or traumatic listhesis of the cervical spine. 5. Diffuse osseous demineralization may limit detection of small or nondisplaced fractures. 6. Multilevel degenerative changes of the cervical spine, similar to comparison imaging. These results were called by telephone at the time of interpretation on 07/12/2020 at 12:45 am to provider [facility], who verbally acknowledged these results. Electronically Signed   By: Lovena Le M.D.   On: 07/12/2020 00:45   CT Cervical Spine Wo Contrast  Result Date: 07/12/2020 CLINICAL DATA:  Fall in shower, right facial and head pain, on Eliquis EXAM: CT HEAD WITHOUT CONTRAST CT MAXILLOFACIAL WITHOUT CONTRAST CT CERVICAL SPINE WITHOUT CONTRAST TECHNIQUE: Multidetector CT imaging of the head, cervical spine, and maxillofacial structures were performed using the standard protocol without intravenous contrast. Multiplanar CT image reconstructions of the cervical spine and maxillofacial structures were also generated. COMPARISON:  CT head and cervical spine 11/06/2019 FINDINGS: CT HEAD FINDINGS Brain: Punctate hyperdensity seen along the surface of the right frontal lobe (3/19) could reflect some trace hemorrhagic contusive change. No other sites of acute intracranial hemorrhage. No evidence of acute infarction, hydrocephalus, large extra-axial collection, visible mass lesion or mass effect.  Symmetric prominence of the ventricles, cisterns and sulci compatible with parenchymal volume loss. Patchy areas of white matter hypoattenuation are most compatible with chronic microvascular angiopathy. Vascular: Atherosclerotic calcification of the carotid siphons. No hyperdense vessel. Skull: Right frontal and temporal scalp swelling and thickening. No calvarial fracture or worrisome osseous lesions of the calvaria. Mild bony demineralization is noted. Other: None. CT MAXILLOFACIAL FINDINGS Osseous: No fracture of the bony orbits. Nasal bones are intact. No other mid face fractures are seen. The pterygoid plates are intact. No visible or suspected temporal bone fractures. Temporomandibular joints are normally aligned. The mandible is intact. No fractured or avulsed teeth. Orbits: Right periorbital soft tissue swelling and thickening. No retro septal gas, stranding or hemorrhage. Prior bilateral lens extractions. The globes appear otherwise normal and symmetric. Symmetric appearance of the extraocular musculature and optic nerve sheath complexes. Normal caliber of the superior ophthalmic veins. Sinuses: Minimal nodular mural thickening in the maxillary sinuses. No layering air-fluid levels or hemosinus. Mastoid air cells are clear with partial pneumatization of the squamosal temporal bone and petrous apices. Middle ear cavities are clear. Debris noted in left external auditory canal. Soft tissues: Right periorbital and malar soft tissue swelling without soft tissue gas or foreign body.  No other significant sites of facial swelling. CT CERVICAL SPINE FINDINGS Alignment: Straightening and slight reversal the normal cervical lordosis which is centered at the C4-5 level. Stepwise anterolisthesis C3-C5 is unchanged from prior and favored to be spondylitic in nature. No evidence of traumatic listhesis. No abnormally widened, perched or jumped facets. Normal alignment of the craniocervical and atlantoaxial articulations.  Skull base and vertebrae: The osseous structures appear diffusely demineralized which may limit detection of small or nondisplaced fractures. No acute skull base fracture. No vertebral body fracture or height loss. Arthrosis about the atlantodental interval with some calcific pannus formation, nonspecific though can be seen in the setting of rheumatoid/CPPD arthropathy. Multilevel cervical spondylitic changes present and similar to comparison imaging, further detailed below. Soft tissues and spinal canal: No pre or paravertebral fluid or swelling. No visible canal hematoma. Airways patent. Some mild cervical carotid atherosclerosis on the right. Disc levels: Multilevel intervertebral disc height loss with spondylitic endplate changes. Larger disc osteophyte complexes are present C3-4, C5-6 and C6-7 with effacement of ventral thecal sac. Mild canal stenosis is present at the C4-5 and C6-7 levels. Multilevel uncinate spurring facet hypertrophic changes are present as well resulting in mild-to-moderate multilevel neural foraminal narrowing as well. Upper chest: No acute abnormality in the upper chest or imaged lung apices. Some biapical pleuroparenchymal scarring is noted. Other: No concerning thyroid nodules or masses. IMPRESSION: 1. Punctate hyperdensity along the surface of the right frontal lobe adjacent sulcus could reflect some trace hemorrhagic contusive change. No other sites of acute intracranial abnormality. 2. Right frontal and temporal scalp swelling and thickening. No calvarial fracture. 3. Right periorbital and malar soft tissue swelling without soft tissue gas or foreign body. No evidence of acute facial bone fracture. 4. No evidence of acute fracture or traumatic listhesis of the cervical spine. 5. Diffuse osseous demineralization may limit detection of small or nondisplaced fractures. 6. Multilevel degenerative changes of the cervical spine, similar to comparison imaging. These results were called by  telephone at the time of interpretation on 07/12/2020 at 12:45 am to provider [facility], who verbally acknowledged these results. Electronically Signed   By: Lovena Le M.D.   On: 07/12/2020 00:45   DG Chest Port 1 View  Result Date: 07/11/2020 CLINICAL DATA:  Fall EXAM: PORTABLE CHEST 1 VIEW COMPARISON:  11/06/2019 FINDINGS: Heart and mediastinal contours are within normal limits. No focal opacities or effusions. No acute bony abnormality. IMPRESSION: No active disease. Electronically Signed   By: Rolm Baptise M.D.   On: 07/11/2020 23:50   CT Maxillofacial WO CM  Result Date: 07/12/2020 CLINICAL DATA:  Fall in shower, right facial and head pain, on Eliquis EXAM: CT HEAD WITHOUT CONTRAST CT MAXILLOFACIAL WITHOUT CONTRAST CT CERVICAL SPINE WITHOUT CONTRAST TECHNIQUE: Multidetector CT imaging of the head, cervical spine, and maxillofacial structures were performed using the standard protocol without intravenous contrast. Multiplanar CT image reconstructions of the cervical spine and maxillofacial structures were also generated. COMPARISON:  CT head and cervical spine 11/06/2019 FINDINGS: CT HEAD FINDINGS Brain: Punctate hyperdensity seen along the surface of the right frontal lobe (3/19) could reflect some trace hemorrhagic contusive change. No other sites of acute intracranial hemorrhage. No evidence of acute infarction, hydrocephalus, large extra-axial collection, visible mass lesion or mass effect. Symmetric prominence of the ventricles, cisterns and sulci compatible with parenchymal volume loss. Patchy areas of white matter hypoattenuation are most compatible with chronic microvascular angiopathy. Vascular: Atherosclerotic calcification of the carotid siphons. No hyperdense vessel. Skull: Right frontal and  temporal scalp swelling and thickening. No calvarial fracture or worrisome osseous lesions of the calvaria. Mild bony demineralization is noted. Other: None. CT MAXILLOFACIAL FINDINGS Osseous:  No fracture of the bony orbits. Nasal bones are intact. No other mid face fractures are seen. The pterygoid plates are intact. No visible or suspected temporal bone fractures. Temporomandibular joints are normally aligned. The mandible is intact. No fractured or avulsed teeth. Orbits: Right periorbital soft tissue swelling and thickening. No retro septal gas, stranding or hemorrhage. Prior bilateral lens extractions. The globes appear otherwise normal and symmetric. Symmetric appearance of the extraocular musculature and optic nerve sheath complexes. Normal caliber of the superior ophthalmic veins. Sinuses: Minimal nodular mural thickening in the maxillary sinuses. No layering air-fluid levels or hemosinus. Mastoid air cells are clear with partial pneumatization of the squamosal temporal bone and petrous apices. Middle ear cavities are clear. Debris noted in left external auditory canal. Soft tissues: Right periorbital and malar soft tissue swelling without soft tissue gas or foreign body. No other significant sites of facial swelling. CT CERVICAL SPINE FINDINGS Alignment: Straightening and slight reversal the normal cervical lordosis which is centered at the C4-5 level. Stepwise anterolisthesis C3-C5 is unchanged from prior and favored to be spondylitic in nature. No evidence of traumatic listhesis. No abnormally widened, perched or jumped facets. Normal alignment of the craniocervical and atlantoaxial articulations. Skull base and vertebrae: The osseous structures appear diffusely demineralized which may limit detection of small or nondisplaced fractures. No acute skull base fracture. No vertebral body fracture or height loss. Arthrosis about the atlantodental interval with some calcific pannus formation, nonspecific though can be seen in the setting of rheumatoid/CPPD arthropathy. Multilevel cervical spondylitic changes present and similar to comparison imaging, further detailed below. Soft tissues and spinal  canal: No pre or paravertebral fluid or swelling. No visible canal hematoma. Airways patent. Some mild cervical carotid atherosclerosis on the right. Disc levels: Multilevel intervertebral disc height loss with spondylitic endplate changes. Larger disc osteophyte complexes are present C3-4, C5-6 and C6-7 with effacement of ventral thecal sac. Mild canal stenosis is present at the C4-5 and C6-7 levels. Multilevel uncinate spurring facet hypertrophic changes are present as well resulting in mild-to-moderate multilevel neural foraminal narrowing as well. Upper chest: No acute abnormality in the upper chest or imaged lung apices. Some biapical pleuroparenchymal scarring is noted. Other: No concerning thyroid nodules or masses. IMPRESSION: 1. Punctate hyperdensity along the surface of the right frontal lobe adjacent sulcus could reflect some trace hemorrhagic contusive change. No other sites of acute intracranial abnormality. 2. Right frontal and temporal scalp swelling and thickening. No calvarial fracture. 3. Right periorbital and malar soft tissue swelling without soft tissue gas or foreign body. No evidence of acute facial bone fracture. 4. No evidence of acute fracture or traumatic listhesis of the cervical spine. 5. Diffuse osseous demineralization may limit detection of small or nondisplaced fractures. 6. Multilevel degenerative changes of the cervical spine, similar to comparison imaging. These results were called by telephone at the time of interpretation on 07/12/2020 at 12:45 am to provider [facility], who verbally acknowledged these results. Electronically Signed   By: Lovena Le M.D.   On: 07/12/2020 00:45    EKG: Independently reviewed. First EKG shows supraventricular bigeminy Second EKG and multiple rhythm strips show an irregularly irregular rate but with P waves visible in lead II (not a.fib).  ? SND.  Assessment/Plan Principal Problem:   SAH (subarachnoid hemorrhage) (HCC) Active  Problems:   Essential hypertension  DVT (deep venous thrombosis) (HCC)   Pseudogout   Sinus node dysfunction ([place])    1. Question of SAH - 1. NS consulted by EDP 1. Recd repeat CT in [place] 2. EDP wants obs admit though 3. Will order neuro checks 4. Tele monitor 5. Hold eliquis 2. H/o DVT - 1. Hold eliquis 3. SND - 1. EKG today suspicious for asymptomatic SND. 2. Will keep pt on tele monitor 3. Sent message to P. Trent for cards to review EKGs and rhythm strips and make recommendations if any needed. 4. HTN - 1. Cont home BP meds 5. Pseudogout - 1. Cont steroid taper  DVT prophylaxis: SCDs Code Status: Full Family Communication: No family in room Disposition Plan: home after obs Consults called: Message sent to P. Trent for cards to review EKG / rhythm strips in AM Admission status: place in obs    Corliss Coggeshall, [place] [facility]  How to contact the [facility] Attending or Consulting provider [place] or covering provider during after hours [place], for this patient?  1. Check the care team in [facility] and look for a) attending/consulting [facility] provider listed and b) the [facility] team listed 2. Log into [email or web address]  Amion Physician Scheduling and messaging for groups and whole hospitals  On call and physician scheduling software for group practices, residents, hospitalists and other medical providers for call, clinic, rotation and shift schedules. OnCall Enterprise is a hospital-wide system for scheduling doctors and paging doctors on call. EasyPlot is for scientific plotting and data analysis.  [email or web address]  and use [place]'s universal password to access. If you do not have the password, please contact the hospital operator.  3. Locate the [facility] provider you are looking for under [facility] and page to a number that you can be directly reached. 4. If you still have difficulty reaching the provider, please page the [facility] (Director on Call) for the Hospitalists listed on amion  for assistance.  07/12/2020, 1:34 AM

## 2020-07-12 NOTE — ED Notes (Signed)
Tele Placed Breakfast order

## 2020-07-12 NOTE — Progress Notes (Signed)
Patient ID: Alexis Olson, female   DOB: 05/17/1933, 87 y.o.   MRN: [id number] BP 133/69   Pulse (!) 43   Temp 98.8 F (37.1 C) (Temporal)   Resp 12   Ht 5\' 5"  (1.651 m)   Wt 85 kg   SpO2 98%   BMI 31.18 kg/m  Films reviewed. This is a miniscule amount of blood, which may very well be artifact. This will not progress to an operative lesion. Anticoagulation should be discontinued and strong consideration given to permanently halting the medication now. She is a proven fall risk, and anticoagulation makes the risk of traumatic hemorrhages quite high.

## 2020-07-12 NOTE — Plan of Care (Addendum)
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  She cannot describe details of the fall but denies any prodromal symptoms, dizziness, chest pain, or shortness of breath.  Head CT shows a small punctuate hemorrhage in the right frontal lobe.  Small Punctuate Subarachnoid Hemorrhage, traumatic: - Patient was evaluated by Neurosurgery who did not recommend any intervention, appreciate assistance.  Given that the bleed was miniscule and not life threatening, we opted to not give Andexanet.  Repeat Head CT after 6 hours did not show any extension of the bleed.  Her blood pressure is running too low to tolerate Nimodipine and we want to maintain a slightly high blood pressure to promote cerebral perfusion.  History of Bilateral DVT: - 04/08/2020 ultrasound showed nonocclusive DVT in the left and right common femoral vein.  Although bilateral DVTs are concerning, I am going to permanently discontinue Eliquis as the risks of bleeding outweigh the risks of a DVT.  She has completed ~ 3 months of anticoagulation in total.  Frequent Falls/Bradyarrhythmias: - Telemetry showed frequent PACs, occasional bradycardia, and one pause for 2-3 seconds, which was not impressive.  However, the nature of her recurrent falls is concerning for a cardiac etiology.  Her last Echo was on 11/07/19 and showed Grade 1 Diastolic Heart Failure with EF 60-65%.  I spoke with Cardiologist Dr. Harrington Challenger over the phone who follows with Mrs. Baca' husband.  Patient will follow up with Dr. Harrington Challenger outpatient to evaluate for pacemaker.  We will discharge her with 30 day Holter monitor.  Gout Flare - Continue prednisone prescribed by PCP.  DVT prophylaxis: SCDs Diet: AHA Dispo: Floors with telemetry Discharge Plan: Discharge tomorrow if neuro exam is stable and if cleared by Neuro.

## 2020-07-12 NOTE — ED Provider Notes (Signed)
[facility] Emergency Department Provider Note MRN:  [id number]  Arrival date & time: 07/12/20     Chief Complaint   Fall/Head Injury (taking Eliquis)    History of Present Illness   Alexis Olson is a 87 y.o. year-old female with a history of DVT presenting to the ED with chief complaint of fall.  Patient was in the shower and was using the bedside commode to help with her balance, the commode shifted and she fell forward.  She is endorsing bruising and pain to the forehead.  On blood thinners.  Endorsing mild bruising and pain to the right wrist, right lateral ribs.  Denies abdominal pain, no neck or back pain, no other complaints or injuries.  Symptoms currently mild to moderate in severity, constant, worse with motion or palpation.  Review of Systems  A complete 10 system review of systems was obtained and all systems are negative except as noted in the HPI and PMH.   Patient's Health History    Past Medical History:  Diagnosis Date  . ALLERGIC RHINITIS   . ANEMIA, CHRONIC    Malabsorption related to bypass hx, B12 and iron deficiency  . ANXIETY   . Arthritis   . ASTHMA   . Bariatric surgery status   . Bowel perforation ([place]) 2016  . C. difficile diarrhea   . Colon perforation ([place]) 08/2014   following polypetomy during colo, surgical repair  . COLONIC POLYPS, ADENOMATOUS, HX OF 2010  . DIVERTICULOSIS, COLON   . DVT (deep venous thrombosis) ([place]) 2018  . DVT (deep venous thrombosis) ([place])   . GERD (gastroesophageal reflux disease) 08/23/2015  . Graded compression stocking in place   . History of blood transfusion   . History of migraine   . History of rheumatic fever   . HSV (herpes simplex virus) infection 08/16/2015  . Hyperlipidemia   . Hypertension    Denies  . Hyponatremia 05/02/2016  . Incomplete left bundle branch block (LBBB) 2016   Noted on EKG  . INSOMNIA   . Lumbosacral disc disease    Chronic pain; History of osteomyelitis 2010  following ESI complication  . Obesity   . Osteomyelitis ([place])   . Pelvic fracture ([place])   . Pneumonia   . Recurrent UTI 08/23/2015  . URINARY URGENCY   . Ventral hernia without obstruction or gangrene 05/22/2018  . VITAMIN B12 DEFICIENCY     Past Surgical History:  Procedure Laterality Date  . [place]   for ruptured disc  . CHOLECYSTECTOMY    . COLONOSCOPY    . greenfield filter    . ileojejunal bypass  1976   for obesity  . LAPAROTOMY N/A 09/18/2014   Procedure: EXPLORATORY LAPAROTOMY WITH REPAIR OF CECIAL PERFORATION;  Surgeon: Autumn Messing III, MD;  Location: [facility];  Service: General;  Laterality: N/A;  . TONSILLECTOMY    . TOTAL KNEE ARTHROPLASTY Right 08/26/2019   Procedure: RIGHT TOTAL KNEE ARTHROPLASTY, LEFT KNEE CORTISONE INJECTION;  Surgeon: Paralee Cancel, MD;  Location: [facility];  Service: Orthopedics;  Laterality: Right;  70 MINS  . UPPER GI ENDOSCOPY    . VASCULAR SURGERY Right    revascularization right lower extremity    Family History  Problem Relation Age of Onset  . Cervical cancer Mother   . Stroke Mother   . Liver disease Sister   . Kidney disease Sister   . Cirrhosis Sister   . Colon cancer Neg Hx   . Esophageal  cancer Neg Hx   . Rectal cancer Neg Hx   . Stomach cancer Neg Hx     Social History   Socioeconomic History  . Marital status: Married    Spouse name: Not on file  . Number of children: 3  . Years of education: Not on file  . Highest education level: Not on file  Occupational History  . Occupation: retired Programmer, multimedia: RETIRED  Tobacco Use  . Smoking status: Never Smoker  . Smokeless tobacco: Never Used  Substance and Sexual Activity  . Alcohol use: No    Alcohol/week: 0.0 standard drinks  . Drug use: No  . Sexual activity: Never    Partners: Male  Other Topics Concern  . Not on file  Social History Narrative   Lives with spouse, Indep ADLs   Supportive family nearby   No dietary restrictions   Social Determinants of  Health   Financial Resource Strain: Not on file  Food Insecurity: Not on file  Transportation Needs: Not on file  Physical Activity: Not on file  Stress: Not on file  Social Connections: Not on file  Intimate Partner Violence: Not on file     Physical Exam   Vitals:   07/12/20 0015 07/12/20 0030  BP: (!) 129/52 133/69  Pulse: (!) 47 (!) 43  Resp: 20 12  Temp:    SpO2: 99% 98%    CONSTITUTIONAL: Well-appearing, NAD NEURO:  Alert and oriented x 3, no focal deficits EYES:  eyes equal and reactive ENT/NECK:  no LAD, no JVD CARDIO: Regular rate, well-perfused, normal S1 and S2 PULM:  CTAB no wheezing or rhonchi GI/GU:  normal bowel sounds, non-distended, non-tender MSK/SPINE:  No gross deformities, no edema, no significant snuffbox tenderness, normal range of motion of the wrist SKIN: Bruising to left forehead, right wrist PSYCH:  Appropriate speech and behavior  *Additional and/or pertinent findings included in MDM below  Diagnostic and Interventional Summary    EKG Interpretation  Date/Time:  Sunday July 11 2020 23:35:02 EDT Ventricular Rate:  110 PR Interval:  160 QRS Duration: 117 QT Interval:  354 QTC Calculation: 436 R Axis:   26 Text Interpretation: Sinus tachycardia Supraventricular bigeminy LVH with secondary repolarization abnormality Anterior infarct, old Confirmed by Gerlene Fee [phone]) on 07/12/2020 12:17:34 AM      Labs Reviewed  COMPREHENSIVE METABOLIC PANEL - Abnormal; Notable for the following components:      Result Value   Glucose, Bld 126 (*)    Albumin 3.2 (*)    AST 64 (*)    ALT 126 (*)    All other components within normal limits  CBC - Abnormal; Notable for the following components:   WBC 14.8 (*)    RBC 3.76 (*)    Hemoglobin 11.4 (*)    Platelets 503 (*)    All other components within normal limits  LACTIC ACID, PLASMA - Abnormal; Notable for the following components:   Lactic Acid, Venous 2.9 (*)    All other components within  normal limits  I-STAT CHEM 8, ED - Abnormal; Notable for the following components:   Glucose, Bld 124 (*)    All other components within normal limits  RESP PANEL BY RT-PCR (FLU A&B, COVID) ARPGX2  ETHANOL  URINALYSIS, ROUTINE W REFLEX MICROSCOPIC  PROTIME-INR  CBC  BASIC METABOLIC PANEL  SAMPLE TO BLOOD BANK    CT Head Wo Contrast  Final Result    CT Cervical Spine Wo Contrast  Final  Result    CT Maxillofacial WO CM  Final Result    DG Chest Port 1 View  Final Result    CT HEAD WO CONTRAST    (Results Pending)    Medications  amitriptyline (ELAVIL) tablet 100 mg (has no administration in time range)  acetaminophen (TYLENOL) tablet 650 mg (has no administration in time range)    Or  acetaminophen (TYLENOL) suppository 650 mg (has no administration in time range)  ondansetron (ZOFRAN) tablet 4 mg (has no administration in time range)    Or  ondansetron (ZOFRAN) injection 4 mg (has no administration in time range)  ALPRAZolam (XANAX) tablet 0.5-1 mg (has no administration in time range)  albuterol (VENTOLIN HFA) 108 (90 Base) MCG/ACT inhaler 1-2 puff (has no administration in time range)  acetaminophen (TYLENOL) tablet 1,000 mg (has no administration in time range)  benzonatate (TESSALON) capsule 100 mg (has no administration in time range)  mometasone-formoterol (DULERA) 200-5 MCG/ACT inhaler 2 puff (has no administration in time range)  loratadine (CLARITIN) tablet 10 mg (has no administration in time range)  dicyclomine (BENTYL) capsule 10 mg (has no administration in time range)  predniSONE (DELTASONE) tablet 5-20 mg (has no administration in time range)  oxyCODONE (Oxy IR/ROXICODONE) immediate release tablet 5 mg (has no administration in time range)  tamsulosin (FLOMAX) capsule 0.4 mg (has no administration in time range)  valACYclovir (VALTREX) tablet 500 mg (has no administration in time range)  gabapentin (NEURONTIN) capsule 300 mg (has no administration in time  range)  hydrOXYzine (ATARAX/VISTARIL) tablet 25 mg (has no administration in time range)  methocarbamol (ROBAXIN) tablet 500 mg (has no administration in time range)  montelukast (SINGULAIR) tablet 10 mg (has no administration in time range)  pantoprazole (PROTONIX) EC tablet 40 mg (has no administration in time range)  lubiprostone (AMITIZA) capsule 24 mcg (has no administration in time range)  magnesium oxide (MAG-OX) tablet 400 mg (has no administration in time range)  fluticasone (FLONASE) 50 MCG/ACT nasal spray 2 spray (has no administration in time range)  multivitamins with iron tablet 1 tablet (has no administration in time range)  docusate sodium (COLACE) capsule 100 mg (has no administration in time range)  diphenoxylate-atropine (LOMOTIL) 2.5-0.025 MG per tablet 1 tablet (has no administration in time range)  potassium chloride SA (KLOR-CON) CR tablet 20 mEq (has no administration in time range)  Vitamin D (Ergocalciferol) (DRISDOL) capsule 50,000 Units (has no administration in time range)  sodium chloride 0.9 % bolus 500 mL (500 mLs Intravenous New Bag/Given 07/12/20 0159)     Procedures  /  Critical Care .Critical Care Performed by: Maudie Flakes, MD Authorized by: Maudie Flakes, MD   Critical care provider statement:    Critical care time (minutes):  32   Critical care was necessary to treat or prevent imminent or life-threatening deterioration of the following conditions: intracranial bleeding.   Critical care was time spent personally by me on the following activities:  Discussions with consultants, evaluation of patient's response to treatment, examination of patient, ordering and performing treatments and interventions, ordering and review of laboratory studies, ordering and review of radiographic studies, pulse oximetry, re-evaluation of patient's condition, obtaining history from patient or surrogate and review of old charts    ED Course and Medical Decision Making   I have reviewed the triage vital signs, the nursing notes, and pertinent available records from the EMR.  Listed above are laboratory and imaging tests that I personally ordered, reviewed, and interpreted and  then considered in my medical decision making (see below for details).  Level 2 trauma for fall, head trauma, on blood thinners.  Patient has a normal neurological exam, conversant, sitting up in the bed.  Overall minimal evidence of trauma.  Awaiting head CT, chest x-ray to exclude rib fractures or pneumothorax.     CT head with punctate bleeding, discussed with Dr. Christella Noa of neurosurgery, discussed with Dr. Alcario Drought hospital service, will admit for observation.  Barth Kirks. Sedonia Small, [place] [email or web address]  Final Clinical Impressions(s) / ED Diagnoses     ICD-10-CM   1. Contusion of right cerebral hemisphere, without loss of consciousness, initial encounter ([place])  S06.310A   2. Trauma  T14.90XA     ED Discharge Orders    None       Discharge Instructions Discussed with and Provided to Patient:   Discharge Instructions   None       Maudie Flakes, MD 07/12/20 0230

## 2020-07-13 DIAGNOSIS — I609 Nontraumatic subarachnoid hemorrhage, unspecified: Secondary | ICD-10-CM | POA: Diagnosis not present

## 2020-07-13 LAB — LACTIC ACID, PLASMA: Lactic Acid, Venous: 2 mmol/L (ref 0.5–1.9)

## 2020-07-13 LAB — BASIC METABOLIC PANEL
Anion gap: 7 (ref 5–15)
BUN: 15 mg/dL (ref 8–23)
CO2: 29 mmol/L (ref 22–32)
Calcium: 8.3 mg/dL — ABNORMAL LOW (ref 8.9–10.3)
Chloride: 101 mmol/L (ref 98–111)
Creatinine, Ser: 0.57 mg/dL (ref 0.44–1.00)
GFR, Estimated: >60 mL/min (ref >60)
Glucose, Bld: 94 mg/dL (ref 70–99)
Potassium: 3.3 mmol/L — ABNORMAL LOW (ref 3.5–5.1)
Sodium: 137 mmol/L (ref 135–145)

## 2020-07-13 MED ORDER — MELATONIN 3 MG PO TABS: 3.0000 mg | ORAL_TABLET | Freq: Every evening | ORAL | Status: DC | PRN

## 2020-07-13 MED ORDER — POTASSIUM CHLORIDE CRYS ER 20 MEQ PO TBCR
40.0000 meq | EXTENDED_RELEASE_TABLET | Freq: Once | ORAL | Status: AC
  Administered 2020-07-13: 40 meq via ORAL
  Filled 2020-07-13: qty 2

## 2020-07-13 MED ORDER — LACTATED RINGERS IV BOLUS
1000.0000 mL | Freq: Once | INTRAVENOUS | Status: AC
  Administered 2020-07-13: 1000 mL via INTRAVENOUS

## 2020-07-13 NOTE — Discharge Summary (Signed)
Physician Discharge Summary  Alexis Olson QBH:[id number] DOB: Oct 15, 1933 DOA: 07/11/2020  PCP: Mosie Lukes, MD  Admit date: 07/11/2020 Discharge date: 07/13/2020  Admitted From: Home Disposition:  Home  Recommendations for Outpatient Follow-up:  1. Follow up with PCP in 1 week 2. Follow up with Cardiologist Dr. Harrington Challenger in 1 week. 3. Holter Monitor was ordered by Dr. Harrington Challenger. 4. I recommended patient discontinue benzodiazepine, antihistamine, muscle relaxers, and oxycodone - which may be contributing to her frequent falls.  Home Health: None Equipment/Devices: None  Discharge Condition: Stable Code Status:   Code Status: Full Code Diet recommendation:  Diet Order            Diet - low sodium heart healthy           Diet regular Room service appropriate? 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  She cannot describe details of the fall but denies any prodromal symptoms, dizziness, chest pain, or shortness of breath.  Head CT shows a small punctuate hemorrhage in the right frontal lobe.  Small Punctuate Subarachnoid Hemorrhage, traumatic: - Patient was evaluated by Neurosurgery who did not recommend any intervention, appreciate assistance.  Given that the bleed was miniscule and not life threatening, we opted to not give Andexanet.  Repeat Head CT after 6 hours did not show any extension of the bleed.  Her blood pressure is running too low to tolerate Nimodipine and we want to maintain a slightly high blood pressure to promote cerebral perfusion. - Patient was monitored for 24 hours and was stable for discharge the following day.  History of Bilateral DVT: - 04/08/2020 ultrasound showed nonocclusive DVT in the left and right common  femoral vein.  Although bilateral DVTs are concerning, I am going to permanently discontinue Eliquis as the risks of bleeding outweigh the risks of a DVT.  She has completed ~ 3 months of anticoagulation in total.  Frequent Falls - Polypharmacy vs Bradyarrhythmias: - I discontinued patient's Xanax, Robaxin, Atarax, and Oxycodone. - Telemetry showed frequent PACs, occasional bradycardia, and one pause for 2-3 seconds.  However, it is important to rule out a cardiac etiology.  Patient's last Echo was on 11/07/19 and showed Grade 1 Diastolic Heart Failure with EF 60-65%.  I spoke with Cardiologist Dr. Harrington Challenger over the phone who follows with [name]' husband.  Patient will be discharged with 30 day Holter Monitor and follow up with Dr. Harrington Challenger outpatient to evaluate for pacemaker.    Gout Flare - Continue prednisone prescribed by PCP.  Discharge Diagnoses:  Principal Problem:   SAH (subarachnoid hemorrhage) (HCC) Active Problems:   Essential hypertension   DVT (deep venous thrombosis) (HCC)   Pseudogout   Sinus node dysfunction (HCC)   Consults:  Neurosurgery.  Subjective: Patient was stable on day of discharge. Discharge Exam: Vitals:   07/13/20 0900 07/13/20 1240  BP: 138/87 120/62  Pulse: (!) 102 79  Resp: 17 18  Temp: 98.5 F (36.9 C) 98 F (36.7 C)  SpO2: 96% 98%   General: Pt is alert, awake, not in acute distress Cardiovascular: RRR, S1/S2 +, no rubs, no gallops Respiratory: CTA bilaterally, no wheezing, no rhonchi Abdominal: Soft,  NT, ND, bowel sounds + Extremities: no edema, no cyanosis  Discharge Instructions  Discharge Instructions    Diet - low sodium heart healthy   Complete by: As directed    Increase activity slowly   Complete by: As directed      Allergies as of 07/13/2020      Reactions   Ativan [lorazepam] Anaphylaxis, Other (See Comments)   Deathly allergic; "resp rate dropped dropped to 6"   Other Anaphylaxis   Red peppers--choking, also    Azithromycin Diarrhea   Benadryl [diphenhydramine Hcl] Other (See Comments)   Paradoxical response   Ciprofloxacin Hcl Other (See Comments)   Causes yeast infection and refuses to take   Codeine Phosphate Nausea Only   Can tolerate in limited amounts   Hydrocodone Itching, Nausea And Vomiting   Hydrocodone-homatropine Itching, Nausea And Vomiting   Levaquin [levofloxacin In D5w] Other (See Comments)   Muscle soreness   Levofloxacin    Meperidine Hcl Other (See Comments)   Reaction ??   Molds & Smuts Other (See Comments)   unsure   Sulfa Antibiotics Other (See Comments)   Joint pain   Tape Other (See Comments)   No Coban wrap (per the patient)   Tramadol    Ultram [tramadol Hcl] Other (See Comments)   "Can't move joints"      Medication List    STOP taking these medications   ALPRAZolam 1 MG tablet Commonly known as: XANAX   Eliquis 5 MG Tabs tablet Generic drug: apixaban   methocarbamol 500 MG tablet Commonly known as: ROBAXIN   oxyCODONE 5 MG immediate release tablet Commonly known as: Oxy IR/ROXICODONE     TAKE these medications   acetaminophen 500 MG tablet Commonly known as: TYLENOL Take 2 tablets (1,000 mg total) by mouth every 8 (eight) hours.   albuterol 108 (90 Base) MCG/ACT inhaler Commonly known as: VENTOLIN HFA Inhale 1-2 puffs into the lungs every 6 (six) hours as needed for wheezing or shortness of breath.   amitriptyline 100 MG tablet Commonly known as: ELAVIL TAKE 1 TABLET (100 MG TOTAL) BY MOUTH DAILY. What changed: how much to take   azelastine 0.05 % ophthalmic solution Commonly known as: OPTIVAR Place 1 drop into both eyes daily as needed (for dry eyes).   benzonatate 100 MG capsule Commonly known as: TESSALON TAKE 1 CAPSULE BY MOUTH 3 TIMES DAILY AS NEEDED FOR COUGH What changed:   how much to take  how to take this  when to take this  reasons to take this   budesonide-formoterol 160-4.5 MCG/ACT inhaler Commonly known as:  SYMBICORT Inhale 2 puffs into the lungs See admin instructions. Inhale 2 puffs into the lungs one to two times a day   cetirizine 10 MG tablet Commonly known as: ZYRTEC Take 10 mg by mouth daily.   cyanocobalamin 1000 MCG/ML injection Commonly known as: (VITAMIN B-12) INJECT 1CC INTO THE MUSCLE EVERY MONTH What changed:   how much to take  how to take this  when to take this   dicyclomine 10 MG capsule Commonly known as: BENTYL TAKE 1 CAPSULE BY MOUTH 3 TIMES DAILY AS NEEDED FOR DIARRHEA AND SYMPTOMS What changed: See the new instructions.   diphenoxylate-atropine 2.5-0.025 MG tablet Commonly known as: LOMOTIL TAKE 2 TABLETS BY MOUTH 3 TIMES DAILY AS NEEDED FOR DIARRHEA What changed:   how much to take  how to take this  when to take this  reasons to take this   docusate sodium 100  MG capsule Commonly known as: Colace Take 1 capsule (100 mg total) by mouth 2 (two) times daily. What changed:   when to take this  reasons to take this   fluticasone 50 MCG/ACT nasal spray Commonly known as: FLONASE Place 2 sprays into the nose daily.   gabapentin 300 MG capsule Commonly known as: NEURONTIN TAKE 1 CAPSULE BY MOUTH AT BEDTIME What changed: how much to take   Hemocyte Plus 106-1 MG Caps TAKE 1 CAPSULE BY MOUTH 2 TIMES DAILY   hydrOXYzine 25 MG tablet Commonly known as: ATARAX/VISTARIL Take 25 mg by mouth 3 (three) times daily as needed for itching.   levocetirizine 5 MG tablet Commonly known as: XYZAL Take 1 tablet (5 mg total) by mouth every evening.   lubiprostone 24 MCG capsule Commonly known as: AMITIZA Take 24 mcg by mouth daily as needed for constipation.   magnesium oxide 400 MG tablet Commonly known as: MAG-OX Take 400 mg by mouth 2 (two) times daily.   montelukast 10 MG tablet Commonly known as: SINGULAIR Take 10 mg by mouth at bedtime.   omeprazole 20 MG capsule Commonly known as: PRILOSEC TAKE 1 CAPSULE BY MOUTH ONCE DAILY What  changed: how much to take   ondansetron 4 MG disintegrating tablet Commonly known as: ZOFRAN-ODT Take 1 tablet (4 mg total) by mouth 3 (three) times daily as needed for nausea or vomiting.   potassium chloride SA 20 MEQ tablet Commonly known as: KLOR-CON TAKE 1 TABLET BY MOUTH ONCE A DAY What changed: how much to take   predniSONE 5 MG tablet Commonly known as: DELTASONE Take 20mg  (4tabs) daily with breakfast on 4/17, 4/18 and 4/19, then take 10mg  (2tabs) daily on 4/20, 4/21 and 4/22, then take 5mg  (1tab) daily on 4/23, 4/24, 4/25, then stop.   PROBIOTIC DAILY PO Take 1 capsule by mouth daily.   tamsulosin 0.4 MG Caps capsule Commonly known as: FLOMAX Take 1 capsule (0.4 mg total) by mouth daily after supper.   valACYclovir 500 MG tablet Commonly known as: VALTREX TAKE 1 TABLET BY MOUTH ONCE A DAY AS NEEDED. What changed:   how much to take  reasons to take this   Vitamin D (Ergocalciferol) 1.25 MG (50000 UNIT) Caps capsule Commonly known as: DRISDOL TAKE 1 CAPSULE BY MOUTH EVERY 7 DAYS What changed: how much to take   zolpidem 5 MG tablet Commonly known as: AMBIEN TAKE 1 TABLET BY MOUTH AT BEDTIME What changed: how much to take       Follow-up Information    [facility] Follow up.   Specialty: Home Health Services Why: THe home health agency will contact you for the next home visit. Contact information: [place] [address] [phone]              Allergies  Allergen Reactions  . Ativan [Lorazepam] Anaphylaxis and Other (See Comments)    Deathly allergic; "resp rate dropped dropped to 6"  . Other Anaphylaxis    Red peppers--choking, also  . Azithromycin Diarrhea  . Benadryl [Diphenhydramine Hcl] Other (See Comments)    Paradoxical response  . Ciprofloxacin Hcl Other (See Comments)    Causes yeast infection and refuses to take  . Codeine Phosphate Nausea Only    Can tolerate in limited amounts  .  Hydrocodone Itching and Nausea And Vomiting  . Hydrocodone-Homatropine Itching and Nausea And Vomiting  . Levaquin [Levofloxacin In D5w] Other (See Comments)    Muscle soreness  .  Levofloxacin   . Meperidine Hcl Other (See Comments)    Reaction ??  . Molds & Smuts Other (See Comments)    unsure  . Sulfa Antibiotics Other (See Comments)    Joint pain  . Tape Other (See Comments)    No Coban wrap (per the patient)  . Tramadol   . Ultram [Tramadol Hcl] Other (See Comments)    "Can't move joints"    The results of significant diagnostics from this hospitalization (including imaging, microbiology, ancillary and laboratory) are listed below for reference.    Microbiology: Recent Results (from the past 240 hour(s))  Blood culture (routine x 2)     Status: None   Collection Time: 07/05/20  5:50 PM   Specimen: BLOOD  Result Value Ref Range Status   Specimen Description BLOOD RIGHT ARM  Final   Special Requests   Final    BOTTLES DRAWN AEROBIC AND ANAEROBIC Blood Culture adequate volume   Culture   Final    NO GROWTH 5 DAYS Performed at [facility], [address], [place], [place] [address]    Report Status 07/10/2020 FINAL  Final  Blood culture (routine x 2)     Status: None   Collection Time: 07/05/20  6:13 PM   Specimen: BLOOD  Result Value Ref Range Status   Specimen Description BLOOD SITE NOT SPECIFIED  Final   Special Requests   Final    BOTTLES DRAWN AEROBIC AND ANAEROBIC Blood Culture adequate volume   Culture   Final    NO GROWTH 5 DAYS Performed at [facility], [place] [address], [place], [place] [address]    Report Status 07/10/2020 FINAL  Final  Body fluid culture w Gram Stain     Status: None   Collection Time: 07/05/20  8:04 PM   Specimen: Synovium  Result Value Ref Range Status   Specimen Description SYNOVIAL  Final   Special Requests LEFT KNEE  Final   Gram Stain   Final    ABUNDANT WBC PRESENT, PREDOMINANTLY PMN NO ORGANISMS SEEN    Culture    Final    NO GROWTH 3 DAYS Performed at [facility], [place] [address] [place], [place] [address]    Report Status 07/09/2020 FINAL  Final  SARS CORONAVIRUS 2 (TAT 6-24 HRS) Nasopharyngeal Nasopharyngeal Swab     Status: None   Collection Time: 07/05/20 10:38 PM   Specimen: Nasopharyngeal Swab  Result Value Ref Range Status   SARS Coronavirus 2 NEGATIVE NEGATIVE Final    Comment: (NOTE) SARS-CoV-2 target nucleic acids are NOT DETECTED.  The SARS-CoV-2 RNA is generally detectable in upper and lower respiratory specimens during the acute phase of infection. Negative results do not preclude SARS-CoV-2 infection, do not rule out co-infections with other pathogens, and should not be used as the sole basis for treatment or other patient management decisions. Negative results must be combined with clinical observations, patient history, and epidemiological information. The expected result is Negative.  Fact Sheet for Patients: SugarRoll.be  Fact Sheet for Healthcare Providers: https://www.woods-mathews.com/  This test is not yet approved or cleared by the [place] FDA and  has been authorized for detection and/or diagnosis of SARS-CoV-2 by FDA under an Emergency Use Authorization (EUA). This EUA will remain  in effect (meaning this test can be used) for the duration of the COVID-19 declaration under Se ction 564(b)(1) of the Act, 21 U.S.C. section 360bbb-3(b)(1), unless the authorization is terminated or revoked sooner.  Performed at [facility], [place] [address] [place], [place] [address]   Surgical pcr screen     Status: None   Collection Time: 07/06/20  1:45 AM   Specimen: Nasal Mucosa; Nasal Swab  Result Value Ref Range Status   MRSA, PCR NEGATIVE NEGATIVE Final   Staphylococcus aureus NEGATIVE NEGATIVE Final    Comment: (NOTE) The Xpert SA Assay (FDA approved for NASAL specimens in patients 32 years of age and older),  is one component of a comprehensive surveillance program. It is not intended to diagnose infection nor to guide or monitor treatment. Performed at [facility], [place] [address] [place], [place] [address]   Resp Panel by RT-PCR (Flu A&B, Covid) Nasopharyngeal Swab     Status: None   Collection Time: 07/11/20 11:37 PM   Specimen: Nasopharyngeal Swab; Nasopharyngeal(NP) swabs in vial transport medium  Result Value Ref Range Status   SARS Coronavirus 2 by RT PCR NEGATIVE NEGATIVE Final    Comment: (NOTE) SARS-CoV-2 target nucleic acids are NOT DETECTED.  The SARS-CoV-2 RNA is generally detectable in upper respiratory specimens during the acute phase of infection. The lowest concentration of SARS-CoV-2 viral copies this assay can detect is 138 copies/mL. A negative result does not preclude SARS-Cov-2 infection and should not be used as the sole basis for treatment or other patient management decisions. A negative result may occur with  improper specimen collection/handling, submission of specimen other than nasopharyngeal swab, presence of viral mutation(s) within the areas targeted by this assay, and inadequate number of viral copies(<138 copies/mL). A negative result must be combined with clinical observations, patient history, and epidemiological information. The expected result is Negative.  Fact Sheet for Patients:  EntrepreneurPulse.com.au  Fact Sheet for Healthcare Providers:  IncredibleEmployment.be  This test is no t yet approved or cleared by the [place] FDA and  has been authorized for detection and/or diagnosis of SARS-CoV-2 by FDA under an Emergency Use Authorization (EUA). This EUA will remain  in effect (meaning this test can be used) for the duration of the COVID-19 declaration under Section 564(b)(1) of the Act, 21 U.S.C.section 360bbb-3(b)(1), unless the authorization is terminated  or revoked sooner.        Influenza A by PCR NEGATIVE NEGATIVE Final   Influenza B by PCR NEGATIVE NEGATIVE Final    Comment: (NOTE) The Xpert Xpress SARS-CoV-2/FLU/RSV plus assay is intended as an aid in the diagnosis of influenza from Nasopharyngeal swab specimens and should not be used as a sole basis for treatment. Nasal washings and aspirates are unacceptable for Xpert Xpress SARS-CoV-2/FLU/RSV testing.  Fact Sheet for Patients: EntrepreneurPulse.com.au  Fact Sheet for Healthcare Providers: IncredibleEmployment.be  This test is not yet approved or cleared by the [place] FDA and has been authorized for detection and/or diagnosis of SARS-CoV-2 by FDA under an Emergency Use Authorization (EUA). This EUA will remain in effect (meaning this test can be used) for the duration of the COVID-19 declaration under Section 564(b)(1) of the Act, 21 U.S.C. section 360bbb-3(b)(1), unless the authorization is terminated or revoked.  Performed at [facility], [place] [address] [place], [place] [address]     Procedures/Studies: CT HEAD WO CONTRAST  Result Date: 07/12/2020 CLINICAL DATA:  Short follow-up subarachnoid hemorrhage. EXAM: CT HEAD WITHOUT CONTRAST TECHNIQUE: Contiguous axial images were obtained from the base of the skull through the vertex without intravenous contrast. COMPARISON:  Earlier today FINDINGS: Brain: Trace right frontal subarachnoid hemorrhage is non progressed. No brain  swelling, infarct, hydrocephalus, or masslike finding. Age normal brain volume. Vascular: No hyperdense vessel or unexpected calcification. Skull: Negative for fracture.  Right-sided scalp swelling. Sinuses/Orbits: Bilateral cataract resection.  No evidence of injury IMPRESSION: No progression of trace, traumatic subarachnoid hemorrhage. Electronically Signed   By: Monte Fantasia M.D.   On: 07/12/2020 07:52   CT Head Wo Contrast  Result Date: 07/12/2020 CLINICAL DATA:  Fall in shower,  right facial and head pain, on Eliquis EXAM: CT HEAD WITHOUT CONTRAST CT MAXILLOFACIAL WITHOUT CONTRAST CT CERVICAL SPINE WITHOUT CONTRAST TECHNIQUE: Multidetector CT imaging of the head, cervical spine, and maxillofacial structures were performed using the standard protocol without intravenous contrast. Multiplanar CT image reconstructions of the cervical spine and maxillofacial structures were also generated. COMPARISON:  CT head and cervical spine 11/06/2019 FINDINGS: CT HEAD FINDINGS Brain: Punctate hyperdensity seen along the surface of the right frontal lobe (3/19) could reflect some trace hemorrhagic contusive change. No other sites of acute intracranial hemorrhage. No evidence of acute infarction, hydrocephalus, large extra-axial collection, visible mass lesion or mass effect. Symmetric prominence of the ventricles, cisterns and sulci compatible with parenchymal volume loss. Patchy areas of white matter hypoattenuation are most compatible with chronic microvascular angiopathy. Vascular: Atherosclerotic calcification of the carotid siphons. No hyperdense vessel. Skull: Right frontal and temporal scalp swelling and thickening. No calvarial fracture or worrisome osseous lesions of the calvaria. Mild bony demineralization is noted. Other: None. CT MAXILLOFACIAL FINDINGS Osseous: No fracture of the bony orbits. Nasal bones are intact. No other mid face fractures are seen. The pterygoid plates are intact. No visible or suspected temporal bone fractures. Temporomandibular joints are normally aligned. The mandible is intact. No fractured or avulsed teeth. Orbits: Right periorbital soft tissue swelling and thickening. No retro septal gas, stranding or hemorrhage. Prior bilateral lens extractions. The globes appear otherwise normal and symmetric. Symmetric appearance of the extraocular musculature and optic nerve sheath complexes. Normal caliber of the superior ophthalmic veins. Sinuses: Minimal nodular mural  thickening in the maxillary sinuses. No layering air-fluid levels or hemosinus. Mastoid air cells are clear with partial pneumatization of the squamosal temporal bone and petrous apices. Middle ear cavities are clear. Debris noted in left external auditory canal. Soft tissues: Right periorbital and malar soft tissue swelling without soft tissue gas or foreign body. No other significant sites of facial swelling. CT CERVICAL SPINE FINDINGS Alignment: Straightening and slight reversal the normal cervical lordosis which is centered at the C4-5 level. Stepwise anterolisthesis C3-C5 is unchanged from prior and favored to be spondylitic in nature. No evidence of traumatic listhesis. No abnormally widened, perched or jumped facets. Normal alignment of the craniocervical and atlantoaxial articulations. Skull base and vertebrae: The osseous structures appear diffusely demineralized which may limit detection of small or nondisplaced fractures. No acute skull base fracture. No vertebral body fracture or height loss. Arthrosis about the atlantodental interval with some calcific pannus formation, nonspecific though can be seen in the setting of rheumatoid/CPPD arthropathy. Multilevel cervical spondylitic changes present and similar to comparison imaging, further detailed below. Soft tissues and spinal canal: No pre or paravertebral fluid or swelling. No visible canal hematoma. Airways patent. Some mild cervical carotid atherosclerosis on the right. Disc levels: Multilevel intervertebral disc height loss with spondylitic endplate changes. Larger disc osteophyte complexes are present C3-4, C5-6 and C6-7 with effacement of ventral thecal sac. Mild canal stenosis is present at the C4-5 and C6-7 levels. Multilevel uncinate spurring facet hypertrophic changes are present as well resulting in mild-to-moderate multilevel  neural foraminal narrowing as well. Upper chest: No acute abnormality in the upper chest or imaged lung apices. Some  biapical pleuroparenchymal scarring is noted. Other: No concerning thyroid nodules or masses. IMPRESSION: 1. Punctate hyperdensity along the surface of the right frontal lobe adjacent sulcus could reflect some trace hemorrhagic contusive change. No other sites of acute intracranial abnormality. 2. Right frontal and temporal scalp swelling and thickening. No calvarial fracture. 3. Right periorbital and malar soft tissue swelling without soft tissue gas or foreign body. No evidence of acute facial bone fracture. 4. No evidence of acute fracture or traumatic listhesis of the cervical spine. 5. Diffuse osseous demineralization may limit detection of small or nondisplaced fractures. 6. Multilevel degenerative changes of the cervical spine, similar to comparison imaging. These results were called by telephone at the time of interpretation on 07/12/2020 at 12:45 am to provider [facility], who verbally acknowledged these results. Electronically Signed   By: Lovena Le M.D.   On: 07/12/2020 00:45   CT Cervical Spine Wo Contrast  Result Date: 07/12/2020 CLINICAL DATA:  Fall in shower, right facial and head pain, on Eliquis EXAM: CT HEAD WITHOUT CONTRAST CT MAXILLOFACIAL WITHOUT CONTRAST CT CERVICAL SPINE WITHOUT CONTRAST TECHNIQUE: Multidetector CT imaging of the head, cervical spine, and maxillofacial structures were performed using the standard protocol without intravenous contrast. Multiplanar CT image reconstructions of the cervical spine and maxillofacial structures were also generated. COMPARISON:  CT head and cervical spine 11/06/2019 FINDINGS: CT HEAD FINDINGS Brain: Punctate hyperdensity seen along the surface of the right frontal lobe (3/19) could reflect some trace hemorrhagic contusive change. No other sites of acute intracranial hemorrhage. No evidence of acute infarction, hydrocephalus, large extra-axial collection, visible mass lesion or mass effect. Symmetric prominence of the ventricles,  cisterns and sulci compatible with parenchymal volume loss. Patchy areas of white matter hypoattenuation are most compatible with chronic microvascular angiopathy. Vascular: Atherosclerotic calcification of the carotid siphons. No hyperdense vessel. Skull: Right frontal and temporal scalp swelling and thickening. No calvarial fracture or worrisome osseous lesions of the calvaria. Mild bony demineralization is noted. Other: None. CT MAXILLOFACIAL FINDINGS Osseous: No fracture of the bony orbits. Nasal bones are intact. No other mid face fractures are seen. The pterygoid plates are intact. No visible or suspected temporal bone fractures. Temporomandibular joints are normally aligned. The mandible is intact. No fractured or avulsed teeth. Orbits: Right periorbital soft tissue swelling and thickening. No retro septal gas, stranding or hemorrhage. Prior bilateral lens extractions. The globes appear otherwise normal and symmetric. Symmetric appearance of the extraocular musculature and optic nerve sheath complexes. Normal caliber of the superior ophthalmic veins. Sinuses: Minimal nodular mural thickening in the maxillary sinuses. No layering air-fluid levels or hemosinus. Mastoid air cells are clear with partial pneumatization of the squamosal temporal bone and petrous apices. Middle ear cavities are clear. Debris noted in left external auditory canal. Soft tissues: Right periorbital and malar soft tissue swelling without soft tissue gas or foreign body. No other significant sites of facial swelling. CT CERVICAL SPINE FINDINGS Alignment: Straightening and slight reversal the normal cervical lordosis which is centered at the C4-5 level. Stepwise anterolisthesis C3-C5 is unchanged from prior and favored to be spondylitic in nature. No evidence of traumatic listhesis. No abnormally widened, perched or jumped facets. Normal alignment of the craniocervical and atlantoaxial articulations. Skull base and vertebrae: The osseous  structures appear diffusely demineralized which may limit detection of small or nondisplaced fractures. No acute skull base fracture. No vertebral body fracture  or height loss. Arthrosis about the atlantodental interval with some calcific pannus formation, nonspecific though can be seen in the setting of rheumatoid/CPPD arthropathy. Multilevel cervical spondylitic changes present and similar to comparison imaging, further detailed below. Soft tissues and spinal canal: No pre or paravertebral fluid or swelling. No visible canal hematoma. Airways patent. Some mild cervical carotid atherosclerosis on the right. Disc levels: Multilevel intervertebral disc height loss with spondylitic endplate changes. Larger disc osteophyte complexes are present C3-4, C5-6 and C6-7 with effacement of ventral thecal sac. Mild canal stenosis is present at the C4-5 and C6-7 levels. Multilevel uncinate spurring facet hypertrophic changes are present as well resulting in mild-to-moderate multilevel neural foraminal narrowing as well. Upper chest: No acute abnormality in the upper chest or imaged lung apices. Some biapical pleuroparenchymal scarring is noted. Other: No concerning thyroid nodules or masses. IMPRESSION: 1. Punctate hyperdensity along the surface of the right frontal lobe adjacent sulcus could reflect some trace hemorrhagic contusive change. No other sites of acute intracranial abnormality. 2. Right frontal and temporal scalp swelling and thickening. No calvarial fracture. 3. Right periorbital and malar soft tissue swelling without soft tissue gas or foreign body. No evidence of acute facial bone fracture. 4. No evidence of acute fracture or traumatic listhesis of the cervical spine. 5. Diffuse osseous demineralization may limit detection of small or nondisplaced fractures. 6. Multilevel degenerative changes of the cervical spine, similar to comparison imaging. These results were called by telephone at the time of interpretation  on 07/12/2020 at 12:45 am to provider [facility], who verbally acknowledged these results. Electronically Signed   By: Lovena Le M.D.   On: 07/12/2020 00:45   DG Chest Port 1 View  Result Date: 07/11/2020 CLINICAL DATA:  Fall EXAM: PORTABLE CHEST 1 VIEW COMPARISON:  11/06/2019 FINDINGS: Heart and mediastinal contours are within normal limits. No focal opacities or effusions. No acute bony abnormality. IMPRESSION: No active disease. Electronically Signed   By: Rolm Baptise M.D.   On: 07/11/2020 23:50   DG Knee Complete 4 Views Left  Result Date: 07/09/2020 CLINICAL DATA:  87 year old female with a history of septic arthritis EXAM: LEFT KNEE - COMPLETE 4+ VIEW COMPARISON:  07/05/2020 FINDINGS: Osteopenia. Lateral greater than medial joint space narrowing with sclerotic changes, marginal osteophyte formation. Degenerative changes at the patellofemoral joint. Redemonstration of suprapatellar joint effusion. No erosive changes of the bones. No acute displaced fracture. Vascular calcifications. IMPRESSION: Negative for acute fracture. Negative for erosive changes of the bones. Joint effusion persists, with advanced tricompartmental osteoarthritis. Electronically Signed   By: Corrie Mckusick D.O.   On: 07/09/2020 12:45   DG Knee Complete 4 Views Left  Result Date: 07/05/2020 CLINICAL DATA:  Left-sided knee pain EXAM: LEFT KNEE - COMPLETE 4+ VIEW COMPARISON:  None. FINDINGS: No fracture or malalignment. Advanced tricompartment arthritis. Joint space calcifications. Large suprapatellar knee effusion. Vascular calcifications. IMPRESSION: 1. 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 2. Chondrocalcinosis. No acute osseous abnormality. Electronically Signed   By: Donavan Foil M.D.   On: 07/05/2020 18:48   CT Maxillofacial WO CM  Result Date: 07/12/2020 CLINICAL DATA:  Fall in shower, right facial and head pain, on Eliquis EXAM: CT HEAD WITHOUT CONTRAST CT MAXILLOFACIAL WITHOUT CONTRAST  CT CERVICAL SPINE WITHOUT CONTRAST TECHNIQUE: Multidetector CT imaging of the head, cervical spine, and maxillofacial structures were performed using the standard protocol without intravenous contrast. Multiplanar CT image reconstructions of the cervical spine and maxillofacial structures were also generated. COMPARISON:  CT head and  cervical spine 11/06/2019 FINDINGS: CT HEAD FINDINGS Brain: Punctate hyperdensity seen along the surface of the right frontal lobe (3/19) could reflect some trace hemorrhagic contusive change. No other sites of acute intracranial hemorrhage. No evidence of acute infarction, hydrocephalus, large extra-axial collection, visible mass lesion or mass effect. Symmetric prominence of the ventricles, cisterns and sulci compatible with parenchymal volume loss. Patchy areas of white matter hypoattenuation are most compatible with chronic microvascular angiopathy. Vascular: Atherosclerotic calcification of the carotid siphons. No hyperdense vessel. Skull: Right frontal and temporal scalp swelling and thickening. No calvarial fracture or worrisome osseous lesions of the calvaria. Mild bony demineralization is noted. Other: None. CT MAXILLOFACIAL FINDINGS Osseous: No fracture of the bony orbits. Nasal bones are intact. No other mid face fractures are seen. The pterygoid plates are intact. No visible or suspected temporal bone fractures. Temporomandibular joints are normally aligned. The mandible is intact. No fractured or avulsed teeth. Orbits: Right periorbital soft tissue swelling and thickening. No retro septal gas, stranding or hemorrhage. Prior bilateral lens extractions. The globes appear otherwise normal and symmetric. Symmetric appearance of the extraocular musculature and optic nerve sheath complexes. Normal caliber of the superior ophthalmic veins. Sinuses: Minimal nodular mural thickening in the maxillary sinuses. No layering air-fluid levels or hemosinus. Mastoid air cells are clear with  partial pneumatization of the squamosal temporal bone and petrous apices. Middle ear cavities are clear. Debris noted in left external auditory canal. Soft tissues: Right periorbital and malar soft tissue swelling without soft tissue gas or foreign body. No other significant sites of facial swelling. CT CERVICAL SPINE FINDINGS Alignment: Straightening and slight reversal the normal cervical lordosis which is centered at the C4-5 level. Stepwise anterolisthesis C3-C5 is unchanged from prior and favored to be spondylitic in nature. No evidence of traumatic listhesis. No abnormally widened, perched or jumped facets. Normal alignment of the craniocervical and atlantoaxial articulations. Skull base and vertebrae: The osseous structures appear diffusely demineralized which may limit detection of small or nondisplaced fractures. No acute skull base fracture. No vertebral body fracture or height loss. Arthrosis about the atlantodental interval with some calcific pannus formation, nonspecific though can be seen in the setting of rheumatoid/CPPD arthropathy. Multilevel cervical spondylitic changes present and similar to comparison imaging, further detailed below. Soft tissues and spinal canal: No pre or paravertebral fluid or swelling. No visible canal hematoma. Airways patent. Some mild cervical carotid atherosclerosis on the right. Disc levels: Multilevel intervertebral disc height loss with spondylitic endplate changes. Larger disc osteophyte complexes are present C3-4, C5-6 and C6-7 with effacement of ventral thecal sac. Mild canal stenosis is present at the C4-5 and C6-7 levels. Multilevel uncinate spurring facet hypertrophic changes are present as well resulting in mild-to-moderate multilevel neural foraminal narrowing as well. Upper chest: No acute abnormality in the upper chest or imaged lung apices. Some biapical pleuroparenchymal scarring is noted. Other: No concerning thyroid nodules or masses. IMPRESSION: 1.  Punctate hyperdensity along the surface of the right frontal lobe adjacent sulcus could reflect some trace hemorrhagic contusive change. No other sites of acute intracranial abnormality. 2. Right frontal and temporal scalp swelling and thickening. No calvarial fracture. 3. Right periorbital and malar soft tissue swelling without soft tissue gas or foreign body. No evidence of acute facial bone fracture. 4. No evidence of acute fracture or traumatic listhesis of the cervical spine. 5. Diffuse osseous demineralization may limit detection of small or nondisplaced fractures. 6. Multilevel degenerative changes of the cervical spine, similar to comparison imaging. These results were  called by telephone at the time of interpretation on 07/12/2020 at 12:45 am to provider [facility], who verbally acknowledged these results. Electronically Signed   By: Lovena Le M.D.   On: 07/12/2020 00:45     Labs: BNP (last 3 results) No results for input(s): BNP in the last 8760 hours. Basic Metabolic Panel: Recent Labs  Lab 07/08/20 0338 07/09/20 0225 07/10/20 0109 07/11/20 2337 07/11/20 2354 07/12/20 0500 07/13/20 0802  NA 136 138  --  139 139 136 137  K 3.2* 4.2  --  3.9 3.8 3.4* 3.3*  CL 111 111  --  103 101 104 101  CO2 20* 19*  --  27  --  26 29  GLUCOSE 98 129*  --  126* 124* 99 94  BUN 12 11  --  18 19 16 15   CREATININE 0.51 0.47  --  0.65 0.60 0.56 0.57  CALCIUM 8.3* 8.8*  --  9.1  --  8.3* 8.3*  MG  --  1.6* 1.8  --   --  1.9  --    Liver Function Tests: Recent Labs  Lab 07/11/20 2337  AST 64*  ALT 126*  ALKPHOS 52  BILITOT 0.3  PROT 7.2  ALBUMIN 3.2*   CBC: Recent Labs  Lab 07/08/20 0338 07/08/20 1527 07/10/20 0109 07/11/20 2337 07/11/20 2354 07/12/20 0500  WBC 7.3 7.3 5.8 14.8*  --  11.8*  NEUTROABS 4.3 6.1 4.6  --   --   --   HGB 8.5* 9.3* 9.0* 11.4* 12.6 9.9*  HCT 26.3* 29.7* 27.2* 36.3 37.0 31.2*  MCV 97.8 96.7 92.8 96.5  --  94.5  PLT 206 231 281 503*  --  446*    Urinalysis    Component Value Date/Time   COLORURINE YELLOW 07/12/2020 0159   APPEARANCEUR CLEAR 07/12/2020 0159   LABSPEC 1.009 07/12/2020 0159   PHURINE 5.0 07/12/2020 0159   GLUCOSEU NEGATIVE 07/12/2020 0159   GLUCOSEU NEGATIVE 03/31/2020 1658   HGBUR NEGATIVE 07/12/2020 0159   HGBUR trace-lysed 02/03/2010 1109   BILIRUBINUR NEGATIVE 07/12/2020 0159   BILIRUBINUR Negative 12/03/2018 1519   KETONESUR NEGATIVE 07/12/2020 0159   PROTEINUR NEGATIVE 07/12/2020 0159   UROBILINOGEN 0.2 03/31/2020 1658   NITRITE NEGATIVE 07/12/2020 0159   LEUKOCYTESUR NEGATIVE 07/12/2020 0159   Microbiology Recent Results (from the past 240 hour(s))  Blood culture (routine x 2)     Status: None   Collection Time: 07/05/20  5:50 PM   Specimen: BLOOD  Result Value Ref Range Status   Specimen Description BLOOD RIGHT ARM  Final   Special Requests   Final    BOTTLES DRAWN AEROBIC AND ANAEROBIC Blood Culture adequate volume   Culture   Final    NO GROWTH 5 DAYS Performed at [facility], [address], [place], [place] [address]    Report Status 07/10/2020 FINAL  Final  Blood culture (routine x 2)     Status: None   Collection Time: 07/05/20  6:13 PM   Specimen: BLOOD  Result Value Ref Range Status   Specimen Description BLOOD SITE NOT SPECIFIED  Final   Special Requests   Final    BOTTLES DRAWN AEROBIC AND ANAEROBIC Blood Culture adequate volume   Culture   Final    NO GROWTH 5 DAYS Performed at [facility], [place] [address] [place], [place] [address]    Report Status 07/10/2020 FINAL  Final  Body fluid culture w Gram Stain  Status: None   Collection Time: 07/05/20  8:04 PM   Specimen: Synovium  Result Value Ref Range Status   Specimen Description SYNOVIAL  Final   Special Requests LEFT KNEE  Final   Gram Stain   Final    ABUNDANT WBC PRESENT, PREDOMINANTLY PMN NO ORGANISMS SEEN    Culture   Final    NO GROWTH 3 DAYS Performed at [facility], [address], [place], [place] [address]    Report Status 07/09/2020 FINAL  Final  SARS CORONAVIRUS 2 (TAT 6-24 HRS) Nasopharyngeal Nasopharyngeal Swab     Status: None   Collection Time: 07/05/20 10:38 PM   Specimen: Nasopharyngeal Swab  Result Value Ref Range Status   SARS Coronavirus 2 NEGATIVE NEGATIVE Final    Comment: (NOTE) SARS-CoV-2 target nucleic acids are NOT DETECTED.  The SARS-CoV-2 RNA is generally detectable in upper and lower respiratory specimens during the acute phase of infection. Negative results do not preclude SARS-CoV-2 infection, do not rule out co-infections with other pathogens, and should not be used as the sole basis for treatment or other patient management decisions. Negative results must be combined with clinical observations, patient history, and epidemiological information. The expected result is Negative.  Fact Sheet for Patients: SugarRoll.be  Fact Sheet for Healthcare Providers: https://www.woods-mathews.com/  This test is not yet approved or cleared by the [place] FDA and  has been authorized for detection and/or diagnosis of SARS-CoV-2 by FDA under an Emergency Use Authorization (EUA). This EUA will remain  in effect (meaning this test can be used) for the duration of the COVID-19 declaration under Se ction 564(b)(1) of the Act, 21 U.S.C. section 360bbb-3(b)(1), unless the authorization is terminated or revoked sooner.  Performed at [facility], [place] [address], [place], [place] [address]   Surgical pcr screen     Status: None   Collection Time: 07/06/20  1:45 AM   Specimen: Nasal Mucosa; Nasal Swab  Result Value Ref Range Status   MRSA, PCR NEGATIVE NEGATIVE Final   Staphylococcus aureus NEGATIVE NEGATIVE Final    Comment: (NOTE) The Xpert SA Assay (FDA approved for NASAL specimens in patients 23 years of age and older), is one component of a comprehensive surveillance program. It is not intended  to diagnose infection nor to guide or monitor treatment. Performed at [facility], [place] [address] [place], [place] [address]   Resp Panel by RT-PCR (Flu A&B, Covid) Nasopharyngeal Swab     Status: None   Collection Time: 07/11/20 11:37 PM   Specimen: Nasopharyngeal Swab; Nasopharyngeal(NP) swabs in vial transport medium  Result Value Ref Range Status   SARS Coronavirus 2 by RT PCR NEGATIVE NEGATIVE Final    Comment: (NOTE) SARS-CoV-2 target nucleic acids are NOT DETECTED.  The SARS-CoV-2 RNA is generally detectable in upper respiratory specimens during the acute phase of infection. The lowest concentration of SARS-CoV-2 viral copies this assay can detect is 138 copies/mL. A negative result does not preclude SARS-Cov-2 infection and should not be used as the sole basis for treatment or other patient management decisions. A negative result may occur with  improper specimen collection/handling, submission of specimen other than nasopharyngeal swab, presence of viral mutation(s) within the areas targeted by this assay, and inadequate number of viral copies(<138 copies/mL). A negative result must be combined with clinical observations, patient history, and epidemiological information. The expected result is Negative.  Fact Sheet for Patients:  EntrepreneurPulse.com.au  Fact Sheet for Healthcare Providers:  IncredibleEmployment.be  This test is no t yet approved or cleared by the [place] and  has been authorized for detection and/or diagnosis of SARS-CoV-2 by FDA under an Emergency Use Authorization (EUA). This EUA will remain  in effect (meaning this test can be used) for the duration of the COVID-19 declaration under Section 564(b)(1) of the Act, 21 U.S.C.section 360bbb-3(b)(1), unless the authorization is terminated  or revoked sooner.       Influenza A by PCR NEGATIVE NEGATIVE Final   Influenza B by PCR NEGATIVE NEGATIVE  Final    Comment: (NOTE) The Xpert Xpress SARS-CoV-2/FLU/RSV plus assay is intended as an aid in the diagnosis of influenza from Nasopharyngeal swab specimens and should not be used as a sole basis for treatment. Nasal washings and aspirates are unacceptable for Xpert Xpress SARS-CoV-2/FLU/RSV testing.  Fact Sheet for Patients: EntrepreneurPulse.com.au  Fact Sheet for Healthcare Providers: IncredibleEmployment.be  This test is not yet approved or cleared by the [place] FDA and has been authorized for detection and/or diagnosis of SARS-CoV-2 by FDA under an Emergency Use Authorization (EUA). This EUA will remain in effect (meaning this test can be used) for the duration of the COVID-19 declaration under Section 564(b)(1) of the Act, 21 U.S.C. section 360bbb-3(b)(1), unless the authorization is terminated or revoked.  Performed at [facility], [place] [address], [place], [place] [address]      Time coordinating discharge: 60 minutes  SIGNED: George Hugh, MD  [facility] 07/13/2020, 5:48 PM  If 7PM-7AM, please contact night-coverage [email or web address]

## 2020-07-13 NOTE — Plan of Care (Signed)

## 2020-07-13 NOTE — Plan of Care (Signed)

## 2020-07-13 NOTE — Progress Notes (Signed)
Pt stable for discharge per MD order. IV and tele removed. Paperwork reviewed with patient, all questions answered. Belongings: purse, cell phone, charger, clothes, shoes. Patient leaving for home.

## 2020-07-13 NOTE — Care Management Obs Status (Signed)
[facility] NOTIFICATION   Patient Details  Name: Alexis Olson MRN: [id number] Date of Birth: Jan 18, 1934   Medicare Observation Status Notification Given:  Yes    Pollie Friar, RN 07/13/2020, 10:18 AM

## 2020-07-13 NOTE — TOC Transition Note (Signed)
Transition of Care [facility]) - CM/SW Discharge Note   Patient Details  Name: Alexis Olson MRN: [id number] Date of Birth: 06/29/1933  Transition of Care [facility] CM/SW Contact:  Pollie Friar, RN Phone Number: 07/13/2020, 10:13 AM   Clinical Narrative:    Pt is discharging home with resumption of [place] services through [place]. HH orders entered and [place] with [facility] aware of d/c.  Pt states she has walker, wheelchair, shower seat at home.  Pt denies issues with home medications or transportation. Pt has transport home today.   Final next level of care: Home w Home Health Services Barriers to Discharge: No Barriers Identified   Patient Goals and CMS Choice   CMS Medicare.gov Compare Post Acute Care list provided to:: Patient Choice offered to / list presented to : Patient  Discharge Placement                       Discharge Plan and Services                          HH Arranged: PT,OT [place]: [place] Date [place]: 07/13/20   Representative spoke with at [place]: [place] ([place]) Interventions     Readmission Risk Interventions Readmission Risk Prevention Plan 11/16/2019  Transportation Screening Complete  PCP or Specialist Appt within 5-7 Days Complete  Home Care Screening Complete  Medication Review (RN CM) Complete  Some recent data might be hidden

## 2020-07-14 ENCOUNTER — Telehealth: Payer: Self-pay

## 2020-07-14 NOTE — Telephone Encounter (Signed)
Transition Care Management Follow-up Telephone Call  Date of discharge and from where: 07/13/20-[place]  How have you been since you were released from the hospital? Still sore but doing ok. Having some diarrhea but that is normal for me.  Any questions or concerns? No  Items Reviewed:  Did the pt receive and understand the discharge instructions provided? Yes   Medications obtained and verified? Yes   Other? Yes   Any new allergies since your discharge? No   Dietary orders reviewed? Yes  Do you have support at home? Yes   Home Care and Equipment/Supplies: Were home health services ordered? yes If so, what is the name of the agency? [facility]  Has the agency set up a time to come to the patient's home? yes Were any new equipment or medical supplies ordered?  No What is the name of the medical supply agency? n/a Were you able to get the supplies/equipment? not applicable Do you have any questions related to the use of the equipment or supplies? n/a Functional Questionnaire: (I = Independent and D = Dependent) ADLs: I with assistance  Bathing/Dressing- I with assistance  Meal Prep- D  Eating- I  Maintaining continence- I with assistance  Transferring/Ambulation- I with assistance  Managing Meds- I   Follow up appointments reviewed:   PCP Hospital f/u appt confirmed? Yes  Scheduled to see Mackie Pai on 07/20/20 @ 3:00.  [facility] f/u appt confirmed? n/a   Are transportation arrangements needed? No   If their condition worsens, is the pt aware to call PCP or go to the Emergency Dept.? Yes  Was the patient provided with contact information for the PCP's office or ED? Yes  Was to pt encouraged to call back with questions or concerns? Yes

## 2020-07-14 NOTE — Telephone Encounter (Signed)
PT is calling with questions about her heart monitor that was suppose to be mailed to her.Pt states it has not come to her yet

## 2020-07-15 ENCOUNTER — Other Ambulatory Visit (HOSPITAL_COMMUNITY): Payer: Self-pay

## 2020-07-17 ENCOUNTER — Ambulatory Visit (INDEPENDENT_AMBULATORY_CARE_PROVIDER_SITE_OTHER): Payer: PPO

## 2020-07-17 DIAGNOSIS — R008 Other abnormalities of heart beat: Secondary | ICD-10-CM

## 2020-07-17 DIAGNOSIS — I491 Atrial premature depolarization: Secondary | ICD-10-CM | POA: Diagnosis not present

## 2020-07-17 DIAGNOSIS — R000 Tachycardia, unspecified: Secondary | ICD-10-CM | POA: Diagnosis not present

## 2020-07-19 ENCOUNTER — Telehealth: Payer: Self-pay | Admitting: Family Medicine

## 2020-07-19 ENCOUNTER — Telehealth: Payer: Self-pay | Admitting: Internal Medicine

## 2020-07-19 DIAGNOSIS — E871 Hypo-osmolality and hyponatremia: Secondary | ICD-10-CM | POA: Diagnosis not present

## 2020-07-19 DIAGNOSIS — D649 Anemia, unspecified: Secondary | ICD-10-CM | POA: Diagnosis not present

## 2020-07-19 DIAGNOSIS — K219 Gastro-esophageal reflux disease without esophagitis: Secondary | ICD-10-CM | POA: Diagnosis not present

## 2020-07-19 DIAGNOSIS — E876 Hypokalemia: Secondary | ICD-10-CM | POA: Diagnosis not present

## 2020-07-19 DIAGNOSIS — M25862 Other specified joint disorders, left knee: Secondary | ICD-10-CM | POA: Diagnosis not present

## 2020-07-19 DIAGNOSIS — I10 Essential (primary) hypertension: Secondary | ICD-10-CM | POA: Diagnosis not present

## 2020-07-19 DIAGNOSIS — M1120 Other chondrocalcinosis, unspecified site: Secondary | ICD-10-CM | POA: Diagnosis not present

## 2020-07-19 DIAGNOSIS — K439 Ventral hernia without obstruction or gangrene: Secondary | ICD-10-CM | POA: Diagnosis not present

## 2020-07-19 DIAGNOSIS — M4627 Osteomyelitis of vertebra, lumbosacral region: Secondary | ICD-10-CM | POA: Diagnosis not present

## 2020-07-19 DIAGNOSIS — M25462 Effusion, left knee: Secondary | ICD-10-CM | POA: Diagnosis not present

## 2020-07-19 DIAGNOSIS — G8929 Other chronic pain: Secondary | ICD-10-CM | POA: Diagnosis not present

## 2020-07-19 DIAGNOSIS — G43909 Migraine, unspecified, not intractable, without status migrainosus: Secondary | ICD-10-CM | POA: Diagnosis not present

## 2020-07-19 DIAGNOSIS — J309 Allergic rhinitis, unspecified: Secondary | ICD-10-CM | POA: Diagnosis not present

## 2020-07-19 DIAGNOSIS — E785 Hyperlipidemia, unspecified: Secondary | ICD-10-CM | POA: Diagnosis not present

## 2020-07-19 DIAGNOSIS — S066X0D Traumatic subarachnoid hemorrhage without loss of consciousness, subsequent encounter: Secondary | ICD-10-CM | POA: Diagnosis not present

## 2020-07-19 DIAGNOSIS — M1712 Unilateral primary osteoarthritis, left knee: Secondary | ICD-10-CM | POA: Diagnosis not present

## 2020-07-19 DIAGNOSIS — I951 Orthostatic hypotension: Secondary | ICD-10-CM | POA: Diagnosis not present

## 2020-07-19 DIAGNOSIS — J45909 Unspecified asthma, uncomplicated: Secondary | ICD-10-CM | POA: Diagnosis not present

## 2020-07-19 DIAGNOSIS — G4700 Insomnia, unspecified: Secondary | ICD-10-CM | POA: Diagnosis not present

## 2020-07-19 DIAGNOSIS — K5730 Diverticulosis of large intestine without perforation or abscess without bleeding: Secondary | ICD-10-CM | POA: Diagnosis not present

## 2020-07-19 DIAGNOSIS — E538 Deficiency of other specified B group vitamins: Secondary | ICD-10-CM | POA: Diagnosis not present

## 2020-07-19 DIAGNOSIS — I498 Other specified cardiac arrhythmias: Secondary | ICD-10-CM | POA: Diagnosis not present

## 2020-07-19 DIAGNOSIS — M009 Pyogenic arthritis, unspecified: Secondary | ICD-10-CM | POA: Diagnosis not present

## 2020-07-19 DIAGNOSIS — F419 Anxiety disorder, unspecified: Secondary | ICD-10-CM | POA: Diagnosis not present

## 2020-07-19 DIAGNOSIS — I4891 Unspecified atrial fibrillation: Secondary | ICD-10-CM | POA: Diagnosis not present

## 2020-07-19 MED FILL — Ergocalciferol Cap 1.25 MG (50000 Unit): ORAL | 84 days supply | Qty: 12 | Fill #0 | Status: AC

## 2020-07-19 NOTE — Telephone Encounter (Signed)
Caller: Tora Duck) Call back # [phone]  Initial evaluation done today  -finding   *Patient would need a referral for home health nursing for disease and medication management.  *B\P Values seating 160/90, standing 140/80  *Patient has chronic diarrhea/ incontinence of urine & bowel   *fyi patient is not taking tamsulosin  * patient need a refill on [id number]  Vitamin D, Ergocalciferol, (DRISDOL) 1.25 MG (50000 UNIT) CAPS capsule [[id number]]   [place], [place] [address]  Phone:  [phone] Fax:  [phone]  DEA #:  [id number]  Pharmacy Comments: Leticia Clas RENEW [id number], [place] [id number]     Need verbal order for PT 2 time a week for 4 week

## 2020-07-19 NOTE — Telephone Encounter (Signed)
Returned call to patient and answered all her questions. Also explained she can call preventice and they can tell if she is transmitting and answer more specific questions for her.

## 2020-07-19 NOTE — Telephone Encounter (Signed)
Herbert Deaner, physical therapist with [facility], states the patient has questions about the heart monitor. He states she is not sure if it is working right and would like the patient to get the call back.

## 2020-07-20 ENCOUNTER — Telehealth: Payer: Self-pay | Admitting: Medical

## 2020-07-20 ENCOUNTER — Other Ambulatory Visit (HOSPITAL_COMMUNITY): Payer: Self-pay

## 2020-07-20 ENCOUNTER — Ambulatory Visit (INDEPENDENT_AMBULATORY_CARE_PROVIDER_SITE_OTHER): Payer: PPO | Admitting: Medical

## 2020-07-20 ENCOUNTER — Other Ambulatory Visit: Payer: Self-pay

## 2020-07-20 ENCOUNTER — Telehealth: Payer: Self-pay | Admitting: Family Medicine

## 2020-07-20 VITALS — BP 121/54 | HR 50 | Resp 22 | Ht 65.0 in | Wt 163.2 lb

## 2020-07-20 DIAGNOSIS — F411 Generalized anxiety disorder: Secondary | ICD-10-CM | POA: Diagnosis not present

## 2020-07-20 DIAGNOSIS — I10 Essential (primary) hypertension: Secondary | ICD-10-CM

## 2020-07-20 DIAGNOSIS — E876 Hypokalemia: Secondary | ICD-10-CM | POA: Diagnosis not present

## 2020-07-20 DIAGNOSIS — R790 Abnormal level of blood mineral: Secondary | ICD-10-CM

## 2020-07-20 DIAGNOSIS — I609 Nontraumatic subarachnoid hemorrhage, unspecified: Secondary | ICD-10-CM

## 2020-07-20 DIAGNOSIS — D649 Anemia, unspecified: Secondary | ICD-10-CM

## 2020-07-20 DIAGNOSIS — I498 Other specified cardiac arrhythmias: Secondary | ICD-10-CM

## 2020-07-20 DIAGNOSIS — G4700 Insomnia, unspecified: Secondary | ICD-10-CM

## 2020-07-20 MED ORDER — MONTELUKAST SODIUM 10 MG PO TABS
ORAL_TABLET | ORAL | 2 refills | Status: DC
  Filled 2020-07-20: qty 90, 90d supply, fill #0
  Filled 2020-10-11: qty 90, 90d supply, fill #1
  Filled 2021-01-07: qty 90, 90d supply, fill #2

## 2020-07-20 MED ORDER — ALPRAZOLAM 0.5 MG PO TABS
0.5000 mg | ORAL_TABLET | Freq: Every evening | ORAL | 0 refills | Status: DC | PRN
  Filled 2020-07-20: qty 5, 5d supply, fill #0

## 2020-07-20 MED FILL — Zolpidem Tartrate Tab 5 MG: ORAL | 90 days supply | Qty: 90 | Fill #0 | Status: AC

## 2020-07-20 NOTE — Patient Instructions (Addendum)
History of small punctate subarachnoid hemorrhage.  Bleed was minuscule.  Discharge from the hospital.  Clinically stable since discharge.  Good neurologic today on exam/interview.  History of hypertension and blood pressure controlled today.  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  Frequent falls with history of bradycardia arrhythmias.  Currently has Holter monitor on.  Does follow that ordered by Dr. Harrington Challenger.  History of low potassium, low magnesium and mild anemia.  Repeat CMP, magnesium and CBC today.  History of anxiety and insomnia.  Various medications were discontinued on discharge.  At your request did prescribe a low number of Xanax to use at night as you have been on that in the past.  Will send Dr. Charlett Blake message and verify that she wants to continue use on a nightly basis.  Follow-up as regular scheduled with PCP or as needed.

## 2020-07-20 NOTE — Telephone Encounter (Signed)
See below

## 2020-07-20 NOTE — Telephone Encounter (Signed)
Caller : Tora Duck  Call Back @ [phone]  Tora Duck called in reference to patient medication list. After reviewing patient's medication there are level 2 medications with interference with potassium  chloride SA   * diphenoxylate-atropine   *amitriptyline (ELAVIL) 100 MG tablet   Clair Gulling with [facility] is just giving an [place]

## 2020-07-20 NOTE — Telephone Encounter (Signed)
I sent you a copy of todays note.  Includes discharge summary from recent hospitalization.  Various medications were discontinued since she had a fall.  She doeswant to continue Xanax low dose at night.  She is also on Ambien at night.  I gave her a 5-day supply xanax but is want to make sure you are okay with her taking Xanax nightly.

## 2020-07-20 NOTE — Telephone Encounter (Signed)
Since she had a fall and she is getting older she needs to pick either Ambien or Xanax, should not take both at night.

## 2020-07-20 NOTE — Progress Notes (Signed)
Subjective:    Patient ID: Alexis Olson, female    DOB: Aug 05, 1933, 87 y.o.   MRN: [id number]  HPI  Pt in for follow up from ED.  Admit date: 07/11/2020 Discharge date: 07/13/2020  Admitted From: Home Disposition:  Home  Recommendations for Outpatient Follow-up:  1. Follow up with PCP in 1 week 2. Follow up with Cardiologist Dr. Harrington Challenger in 1 week. 3. Holter Monitor was ordered by Dr. Harrington Challenger. 4. I recommended patient discontinue benzodiazepine, antihistamine, muscle relaxers, and oxycodone - which may be contributing to her frequent falls.   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 She cannot describe details of the fall but denies any prodromal symptoms, dizziness, chest pain, or shortness of breath. Head CT shows a small punctuate hemorrhage in the right frontal lobe.  Small Punctuate Subarachnoid Hemorrhage, traumatic: -Patient was evaluated by Neurosurgerywhodid not recommend anyintervention, appreciate assistance. Given that the bleed was miniscule and not life threatening, we opted to not give Andexanet. Repeat Head CT after 6 hours did not show any extension of the bleed. Her blood pressure is running too low to tolerate Nimodipineand we want to maintain a slightly high blood pressure to promote cerebral perfusion. - Patient was monitored for 24 hours and was stable for discharge the following day.  History of Bilateral DVT: - 04/08/2020 ultrasound showed nonocclusive DVT in the left and right common femoral vein. Although bilateral DVTs are concerning, I am going to permanently discontinue Eliquis as the risks of bleeding outweigh the risks of a DVT. She has completed ~ 3 months of anticoagulation in total.  Frequent Falls - Polypharmacy vs Bradyarrhythmias: - I discontinued  patient's Xanax, Robaxin, Atarax, and Oxycodone. - Telemetry showed frequent PACs, occasional bradycardia, and one pause for 2-3 seconds. However, it is important to rule out a cardiac etiology. Patient's last Echo was on 11/07/19 and showed Grade 1 Diastolic Heart Failure with EF 60-65%. I spoke with Cardiologist Dr. Harrington Challenger over the phone who follows with Alexis Olson'husband. Patient will be discharged with 30 day Holter Monitor and follow up with Dr. Harrington Challenger outpatient to evaluate for pacemaker.    Gout Flare - Continue prednisone prescribed by PCP.   Discharge Diagnoses:  Principal Problem:   SAH (subarachnoid hemorrhage) (HCC) Active Problems:   Essential hypertension   DVT (deep venous thrombosis) (HCC)   Pseudogout   Sinus node dysfunction (HCC)  Consults:  Neurosurgery   Post discharge home health already came out since DC. Home health was there 2 hours yesterday.  In hospital pt states hb low, mg low and k low.    Pt has holter monitor already. Dr. Harrington Challenger ordered. On review looks like hx of f bradyarrhythmias.   Pt has anxiety. Many of her meds stopped on DC. Pain meds, antihistamine and muscle relaxant. Pt states that is fine but she states need xanax just to use at night so can sleep. She states won't take during the day   Review of Systems  Constitutional: Negative for chills, fatigue and fever.  Respiratory: Negative for cough, chest tightness, shortness of breath and wheezing.   Cardiovascular: Negative for chest pain and palpitations.  Gastrointestinal: Negative for abdominal pain.  Musculoskeletal: Negative for back pain.  Skin: Negative for rash.  Neurological: Negative for dizziness, speech difficulty, weakness and light-headedness.  Hematological: Negative for adenopathy. Does not bruise/bleed easily.  Psychiatric/Behavioral: Positive for sleep disturbance. Negative for behavioral problems, decreased concentration and suicidal ideas. The patient is  nervous/anxious.    Past Medical History:  Diagnosis Date  . ALLERGIC RHINITIS   . ANEMIA, CHRONIC    Malabsorption related to bypass hx, B12 and iron deficiency  . ANXIETY   . Arthritis   . ASTHMA   . Bariatric surgery status   . Bowel perforation ([place]) 2016  . C. difficile diarrhea   . Colon perforation ([place]) 08/2014   following polypetomy during colo, surgical repair  . COLONIC POLYPS, ADENOMATOUS, HX OF 2010  . DIVERTICULOSIS, COLON   . DVT (deep venous thrombosis) ([place]) 2018  . DVT (deep venous thrombosis) ([place])   . GERD (gastroesophageal reflux disease) 08/23/2015  . Graded compression stocking in place   . History of blood transfusion   . History of migraine   . History of rheumatic fever   . HSV (herpes simplex virus) infection 08/16/2015  . Hyperlipidemia   . Hypertension    Denies  . Hyponatremia 05/02/2016  . Incomplete left bundle branch block (LBBB) 2016   Noted on EKG  . INSOMNIA   . Lumbosacral disc disease    Chronic pain; History of osteomyelitis 2010 following ESI complication  . Obesity   . Osteomyelitis ([place])   . Pelvic fracture ([place])   . Pneumonia   . Recurrent UTI 08/23/2015  . URINARY URGENCY   . Ventral hernia without obstruction or gangrene 05/22/2018  . VITAMIN B12 DEFICIENCY      Social History   Socioeconomic History  . Marital status: Married    Spouse name: Not on file  . Number of children: 3  . Years of education: Not on file  . Highest education level: Not on file  Occupational History  . Occupation: retired Programmer, multimedia: RETIRED  Tobacco Use  . Smoking status: Never Smoker  . Smokeless tobacco: Never Used  Substance and Sexual Activity  . Alcohol use: No    Alcohol/week: 0.0 standard drinks  . Drug use: No  . Sexual activity: Never    Partners: Male  Other Topics Concern  . Not on file  Social History Narrative   Lives with spouse, Indep ADLs   Supportive family nearby   No dietary restrictions   Social Determinants  of Health   Financial Resource Strain: Not on file  Food Insecurity: Not on file  Transportation Needs: Not on file  Physical Activity: Not on file  Stress: Not on file  Social Connections: Not on file  Intimate Partner Violence: Not on file    Past Surgical History:  Procedure Laterality Date  . [place]   for ruptured disc  . CHOLECYSTECTOMY    . COLONOSCOPY    . greenfield filter    . ileojejunal bypass  1976   for obesity  . LAPAROTOMY N/A 09/18/2014   Procedure: EXPLORATORY LAPAROTOMY WITH REPAIR OF CECIAL PERFORATION;  Surgeon: Autumn Messing III, MD;  Location: [facility];  Service: General;  Laterality: N/A;  . TONSILLECTOMY    . TOTAL KNEE ARTHROPLASTY Right 08/26/2019   Procedure: RIGHT TOTAL KNEE ARTHROPLASTY, LEFT KNEE CORTISONE INJECTION;  Surgeon: Paralee Cancel, MD;  Location: [facility];  Service: Orthopedics;  Laterality: Right;  70 MINS  . UPPER GI ENDOSCOPY    . VASCULAR SURGERY Right    revascularization right lower extremity    Family History  Problem Relation  Age of Onset  . Cervical cancer Mother   . Stroke Mother   . Liver disease Sister   . Kidney disease Sister   . Cirrhosis Sister   . Colon cancer Neg Hx   . Esophageal cancer Neg Hx   . Rectal cancer Neg Hx   . Stomach cancer Neg Hx     Allergies  Allergen Reactions  . Ativan [Lorazepam] Anaphylaxis and Other (See Comments)    Deathly allergic; "resp rate dropped dropped to 6"  . Other Anaphylaxis    Red peppers--choking, also  . Azithromycin Diarrhea  . Benadryl [Diphenhydramine Hcl] Other (See Comments)    Paradoxical response  . Ciprofloxacin Hcl Other (See Comments)    Causes yeast infection and refuses to take  . Codeine Phosphate Nausea Only    Can tolerate in limited amounts  . Hydrocodone Itching and Nausea And Vomiting  . Hydrocodone-Homatropine Itching and Nausea And Vomiting  . Levaquin [Levofloxacin In D5w] Other (See Comments)    Muscle soreness  . Levofloxacin   .  Meperidine Hcl Other (See Comments)    Reaction ??  . Molds & Smuts Other (See Comments)    unsure  . Sulfa Antibiotics Other (See Comments)    Joint pain  . Tape Other (See Comments)    No Coban wrap (per the patient)  . Tramadol   . Ultram [Tramadol Hcl] Other (See Comments)    "Can't move joints"    Current Outpatient Medications on File Prior to Visit  Medication Sig Dispense Refill  . acetaminophen (TYLENOL) 500 MG tablet Take 2 tablets (1,000 mg total) by mouth every 8 (eight) hours. 30 tablet 0  . albuterol (PROVENTIL HFA;VENTOLIN HFA) 108 (90 BASE) MCG/ACT inhaler Inhale 1-2 puffs into the lungs every 6 (six) hours as needed for wheezing or shortness of breath.    Marland Kitchen amitriptyline (ELAVIL) 100 MG tablet TAKE 1 TABLET (100 MG TOTAL) BY MOUTH DAILY. (Patient taking differently: Take 100 mg by mouth daily.) 90 tablet 1  . azelastine (OPTIVAR) 0.05 % ophthalmic solution Place 1 drop into both eyes daily as needed (for dry eyes).    . benzonatate (TESSALON) 100 MG capsule TAKE 1 CAPSULE BY MOUTH 3 TIMES DAILY AS NEEDED FOR COUGH (Patient taking differently: Take 100 mg by mouth 3 (three) times daily as needed for cough.) 30 capsule 0  . budesonide-formoterol (SYMBICORT) 160-4.5 MCG/ACT inhaler Inhale 2 puffs into the lungs See admin instructions. Inhale 2 puffs into the lungs one to two times a day 1 Inhaler 2  . cetirizine (ZYRTEC) 10 MG tablet Take 10 mg by mouth daily.     . cyanocobalamin (,VITAMIN B-12,) 1000 MCG/ML injection INJECT 1CC INTO THE MUSCLE EVERY MONTH (Patient taking differently: Inject 1,000 mcg into the muscle every 30 (thirty) days.) 10 mL 1  . dicyclomine (BENTYL) 10 MG capsule TAKE 1 CAPSULE BY MOUTH 3 TIMES DAILY AS NEEDED FOR DIARRHEA AND SYMPTOMS (Patient taking differently: Take 10 mg by mouth 3 (three) times daily as needed for spasms.) 270 capsule 3  . diphenoxylate-atropine (LOMOTIL) 2.5-0.025 MG tablet TAKE 2 TABLETS BY MOUTH 3 TIMES DAILY AS NEEDED FOR  DIARRHEA (Patient taking differently: Take 1 tablet by mouth 3 (three) times daily as needed for diarrhea or loose stools.) 150 tablet 0  . docusate sodium (COLACE) 100 MG capsule Take 1 capsule (100 mg total) by mouth 2 (two) times daily. (Patient taking differently: Take 100 mg by mouth daily as needed for mild constipation.) 28  capsule 0  . Fe Fum-FA-B Cmp-C-Zn-Mg-Mn-Cu (HEMOCYTE PLUS) 106-1 MG CAPS TAKE 1 CAPSULE BY MOUTH 2 TIMES DAILY 60 capsule 1  . fluticasone (FLONASE) 50 MCG/ACT nasal spray Place 2 sprays into the nose daily.    Marland Kitchen gabapentin (NEURONTIN) 300 MG capsule TAKE 1 CAPSULE BY MOUTH AT BEDTIME (Patient taking differently: Take 300 mg by mouth at bedtime.) 90 capsule 1  . hydrOXYzine (ATARAX) 25 MG tablet Take 25 mg by mouth 3 (three) times daily as needed for itching.    . levocetirizine (XYZAL) 5 MG tablet Take 1 tablet (5 mg total) by mouth every evening. 90 tablet 3  . lubiprostone (AMITIZA) 24 MCG capsule Take 24 mcg by mouth daily as needed for constipation.    . magnesium oxide (MAG-OX) 400 MG tablet Take 400 mg by mouth 2 (two) times daily.    . montelukast (SINGULAIR) 10 MG tablet Take 10 mg by mouth at bedtime.    Marland Kitchen omeprazole (PRILOSEC) 20 MG capsule TAKE 1 CAPSULE BY MOUTH ONCE DAILY (Patient taking differently: Take 20 mg by mouth daily.) 90 capsule 0  . ondansetron (ZOFRAN-ODT) 4 MG disintegrating tablet Take 1 tablet (4 mg total) by mouth 3 (three) times daily as needed for nausea or vomiting. 20 tablet 0  . potassium chloride SA (KLOR-CON) 20 MEQ tablet TAKE 1 TABLET BY MOUTH ONCE A DAY (Patient taking differently: Take 20 mEq by mouth daily.) 90 tablet 1  . predniSONE (DELTASONE) 5 MG tablet Take 20mg  (4tabs) daily with breakfast on 4/17, 4/18 and 4/19, then take 10mg  (2tabs) daily on 4/20, 4/21 and 4/22, then take 5mg  (1tab) daily on 4/23, 4/24, 4/25, then stop. 21 tablet 0  . Probiotic Product (PROBIOTIC DAILY PO) Take 1 capsule by mouth daily.    . tamsulosin  (FLOMAX) 0.4 MG CAPS capsule Take 1 capsule (0.4 mg total) by mouth daily after supper. 30 capsule 0  . valACYclovir (VALTREX) 500 MG tablet TAKE 1 TABLET BY MOUTH ONCE A DAY AS NEEDED. (Patient taking differently: Take 500 mg by mouth daily as needed (For outbreaks).) 30 tablet 5  . Vitamin D, Ergocalciferol, (DRISDOL) 1.25 MG (50000 UNIT) CAPS capsule TAKE 1 CAPSULE BY MOUTH EVERY 7 DAYS (Patient taking differently: Take 50,000 Units by mouth every 7 (seven) days.) 12 capsule 4  . zolpidem (AMBIEN) 5 MG tablet TAKE 1 TABLET BY MOUTH AT BEDTIME (Patient taking differently: Take 5 mg by mouth at bedtime.) 90 tablet 1   No current facility-administered medications on file prior to visit.    BP (!) 121/54   Pulse (!) 50   Resp (!) 22   Ht 5\' 5"  (1.651 m)   Wt 163 lb 3.2 oz (74 kg)   SpO2 98%   BMI 27.16 kg/m       Objective:   Physical Exam  General Mental Status- Alert. General Appearance- Not in acute distress.   Skin General: Color- Normal Color. Moisture- Normal Moisture.  Neck Carotid Arteries- Normal color. Moisture- Normal Moisture. No carotid bruits. No JVD.  Chest and Lung Exam Auscultation: Breath Sounds:-Normal.  Cardiovascular Auscultation:Rythm- Regular. Murmurs & Other Heart Sounds:Auscultation of the heart reveals- No Murmurs.  Abdomen Inspection:-Inspeection Normal. Palpation/Percussion:Note:No mass. Palpation and Percussion of the abdomen reveal- Non Tender, Non Distended + BS, no rebound or guarding.    Neurologic Cranial Nerve exam:- CN III-XII intact(No nystagmus), symmetric smile. Strength:- 5/5 equal and symmetric strength both upper and lower extremities.   Lower extremities-symmetric no pedal edema.  Assessment & Plan:  History of small punctate subarachnoid hemorrhage.  Bleed was minuscule.  Discharge from the hospital.  Clinically stable since discharge.  Good neurologic today on exam/interview.  History of hypertension and blood  pressure controlled today.  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  Frequent falls with history of bradycardia arrhythmias.  Currently has Holter monitor on.  Does follow that ordered by Dr. Harrington Challenger.  History of low potassium, low magnesium and mild anemia.  Repeat CMP, magnesium and CBC today.  History of anxiety and insomnia.  Various medications were discontinued on discharge.  At your request did prescribe a low number of Xanax to use at night as you have been on that in the past.  Will send Dr. Charlett Blake message and verify that she wants to continue use on a nightly basis.  Follow-up as regular scheduled with PCP or as needed.  Mackie Pai, PA-C

## 2020-07-21 ENCOUNTER — Other Ambulatory Visit (HOSPITAL_COMMUNITY): Payer: Self-pay

## 2020-07-21 LAB — COMPREHENSIVE METABOLIC PANEL
ALT: 24 U/L (ref 0–35)
AST: 18 U/L (ref 0–37)
Albumin: 4 g/dL (ref 3.5–5.2)
Alkaline Phosphatase: 64 U/L (ref 39–117)
BUN: 17 mg/dL (ref 6–23)
CO2: 26 mEq/L (ref 19–32)
Calcium: 9.1 mg/dL (ref 8.4–10.5)
Chloride: 99 mEq/L (ref 96–112)
Creatinine, Ser: 0.65 mg/dL (ref 0.40–1.20)
GFR: 80.39 mL/min (ref >60.00)
Glucose, Bld: 96 mg/dL (ref 70–99)
Potassium: 4.5 mEq/L (ref 3.5–5.1)
Sodium: 135 mEq/L (ref 135–145)
Total Bilirubin: 0.3 mg/dL (ref 0.2–1.2)
Total Protein: 7.2 g/dL (ref 6.0–8.3)

## 2020-07-21 LAB — CBC WITH DIFFERENTIAL/PLATELET
Basophils Absolute: 0.1 10*3/uL (ref 0.0–0.1)
Basophils Relative: 0.4 % (ref 0.0–3.0)
Eosinophils Absolute: 0.2 10*3/uL (ref 0.0–0.7)
Eosinophils Relative: 1.6 % (ref 0.0–5.0)
HCT: 35.8 % — ABNORMAL LOW (ref 36.0–46.0)
Hemoglobin: 11.7 g/dL — ABNORMAL LOW (ref 12.0–15.0)
Lymphocytes Relative: 15.9 % (ref 12.0–46.0)
Lymphs Abs: 2.1 10*3/uL (ref 0.7–4.0)
MCHC: 32.7 g/dL (ref 30.0–36.0)
MCV: 93.2 fl (ref 78.0–100.0)
Monocytes Absolute: 1.2 10*3/uL — ABNORMAL HIGH (ref 0.1–1.0)
Monocytes Relative: 8.8 % (ref 3.0–12.0)
Neutro Abs: 9.6 10*3/uL — ABNORMAL HIGH (ref 1.4–7.7)
Neutrophils Relative %: 73.3 % (ref 43.0–77.0)
Platelets: 398 10*3/uL (ref 150.0–400.0)
RBC: 3.84 Mil/uL — ABNORMAL LOW (ref 3.87–5.11)
RDW: 15.7 % — ABNORMAL HIGH (ref 11.5–15.5)
WBC: 13.1 10*3/uL — ABNORMAL HIGH (ref 4.0–10.5)

## 2020-07-21 LAB — MAGNESIUM: Magnesium: 2.1 mg/dL (ref 1.5–2.5)

## 2020-07-21 NOTE — Telephone Encounter (Signed)
Yes. Please confirm she is willing to stop one. I think it will be ambien. Then we can send in 30 tab rx of xanax to only use at night.

## 2020-07-21 NOTE — Telephone Encounter (Signed)
Lvm to call back

## 2020-07-21 NOTE — Telephone Encounter (Signed)
Pt would like to go off  of the Xanax and stay on the Ambien.

## 2020-07-21 NOTE — Telephone Encounter (Signed)
Is ok to send the Vitamin D listed below in for pt

## 2020-07-21 NOTE — Telephone Encounter (Signed)
Yes I will discuss it with her at next visit.

## 2020-07-21 NOTE — Telephone Encounter (Signed)
Would you like me to call her and let her know to stop taking the Ambien at night?

## 2020-07-21 NOTE — Telephone Encounter (Signed)
Ok thanks. Will just refill ambien when it is due.

## 2020-07-22 ENCOUNTER — Other Ambulatory Visit: Payer: Self-pay

## 2020-07-22 ENCOUNTER — Other Ambulatory Visit (HOSPITAL_COMMUNITY): Payer: Self-pay

## 2020-07-22 MED ORDER — VITAMIN D (ERGOCALCIFEROL) 1.25 MG (50000 UNIT) PO CAPS
ORAL_CAPSULE | ORAL | 4 refills | Status: DC
  Filled 2020-07-22: qty 4, fill #0
  Filled 2020-10-11: qty 4, 28d supply, fill #0

## 2020-07-22 NOTE — Telephone Encounter (Signed)
Sent in vitamin D for pt

## 2020-07-23 ENCOUNTER — Other Ambulatory Visit (HOSPITAL_COMMUNITY): Payer: Self-pay

## 2020-07-26 DIAGNOSIS — G43909 Migraine, unspecified, not intractable, without status migrainosus: Secondary | ICD-10-CM | POA: Diagnosis not present

## 2020-07-26 DIAGNOSIS — I4891 Unspecified atrial fibrillation: Secondary | ICD-10-CM | POA: Diagnosis not present

## 2020-07-26 DIAGNOSIS — J45909 Unspecified asthma, uncomplicated: Secondary | ICD-10-CM | POA: Diagnosis not present

## 2020-07-26 DIAGNOSIS — F419 Anxiety disorder, unspecified: Secondary | ICD-10-CM | POA: Diagnosis not present

## 2020-07-26 DIAGNOSIS — M4627 Osteomyelitis of vertebra, lumbosacral region: Secondary | ICD-10-CM | POA: Diagnosis not present

## 2020-07-26 DIAGNOSIS — E876 Hypokalemia: Secondary | ICD-10-CM | POA: Diagnosis not present

## 2020-07-26 DIAGNOSIS — K5730 Diverticulosis of large intestine without perforation or abscess without bleeding: Secondary | ICD-10-CM | POA: Diagnosis not present

## 2020-07-26 DIAGNOSIS — J309 Allergic rhinitis, unspecified: Secondary | ICD-10-CM | POA: Diagnosis not present

## 2020-07-26 DIAGNOSIS — I10 Essential (primary) hypertension: Secondary | ICD-10-CM | POA: Diagnosis not present

## 2020-07-26 DIAGNOSIS — K439 Ventral hernia without obstruction or gangrene: Secondary | ICD-10-CM | POA: Diagnosis not present

## 2020-07-26 DIAGNOSIS — M1120 Other chondrocalcinosis, unspecified site: Secondary | ICD-10-CM | POA: Diagnosis not present

## 2020-07-26 DIAGNOSIS — E538 Deficiency of other specified B group vitamins: Secondary | ICD-10-CM | POA: Diagnosis not present

## 2020-07-26 DIAGNOSIS — E871 Hypo-osmolality and hyponatremia: Secondary | ICD-10-CM | POA: Diagnosis not present

## 2020-07-26 DIAGNOSIS — E785 Hyperlipidemia, unspecified: Secondary | ICD-10-CM | POA: Diagnosis not present

## 2020-07-26 DIAGNOSIS — S066X0D Traumatic subarachnoid hemorrhage without loss of consciousness, subsequent encounter: Secondary | ICD-10-CM | POA: Diagnosis not present

## 2020-07-26 DIAGNOSIS — I951 Orthostatic hypotension: Secondary | ICD-10-CM | POA: Diagnosis not present

## 2020-07-26 DIAGNOSIS — G8929 Other chronic pain: Secondary | ICD-10-CM | POA: Diagnosis not present

## 2020-07-26 DIAGNOSIS — D649 Anemia, unspecified: Secondary | ICD-10-CM | POA: Diagnosis not present

## 2020-07-26 DIAGNOSIS — I498 Other specified cardiac arrhythmias: Secondary | ICD-10-CM | POA: Diagnosis not present

## 2020-07-26 DIAGNOSIS — K219 Gastro-esophageal reflux disease without esophagitis: Secondary | ICD-10-CM | POA: Diagnosis not present

## 2020-07-26 DIAGNOSIS — M1712 Unilateral primary osteoarthritis, left knee: Secondary | ICD-10-CM | POA: Diagnosis not present

## 2020-07-26 DIAGNOSIS — M25862 Other specified joint disorders, left knee: Secondary | ICD-10-CM | POA: Diagnosis not present

## 2020-07-26 DIAGNOSIS — M25462 Effusion, left knee: Secondary | ICD-10-CM | POA: Diagnosis not present

## 2020-07-26 DIAGNOSIS — M009 Pyogenic arthritis, unspecified: Secondary | ICD-10-CM | POA: Diagnosis not present

## 2020-07-26 DIAGNOSIS — G4700 Insomnia, unspecified: Secondary | ICD-10-CM | POA: Diagnosis not present

## 2020-07-27 ENCOUNTER — Other Ambulatory Visit (HOSPITAL_COMMUNITY): Payer: Self-pay

## 2020-07-27 MED ORDER — DIPHENOXYLATE-ATROPINE 2.5-0.025 MG PO TABS
ORAL_TABLET | ORAL | 0 refills | Status: DC
  Filled 2020-07-27: qty 150, 25d supply, fill #0

## 2020-08-06 ENCOUNTER — Other Ambulatory Visit (HOSPITAL_COMMUNITY): Payer: Self-pay

## 2020-08-07 ENCOUNTER — Other Ambulatory Visit: Payer: Self-pay

## 2020-08-07 ENCOUNTER — Other Ambulatory Visit (HOSPITAL_COMMUNITY): Payer: Self-pay

## 2020-08-09 ENCOUNTER — Other Ambulatory Visit (HOSPITAL_COMMUNITY): Payer: Self-pay

## 2020-08-09 DIAGNOSIS — I10 Essential (primary) hypertension: Secondary | ICD-10-CM | POA: Diagnosis not present

## 2020-08-09 DIAGNOSIS — K5730 Diverticulosis of large intestine without perforation or abscess without bleeding: Secondary | ICD-10-CM | POA: Diagnosis not present

## 2020-08-09 DIAGNOSIS — E785 Hyperlipidemia, unspecified: Secondary | ICD-10-CM | POA: Diagnosis not present

## 2020-08-09 DIAGNOSIS — I498 Other specified cardiac arrhythmias: Secondary | ICD-10-CM | POA: Diagnosis not present

## 2020-08-09 DIAGNOSIS — G4700 Insomnia, unspecified: Secondary | ICD-10-CM | POA: Diagnosis not present

## 2020-08-09 DIAGNOSIS — M4627 Osteomyelitis of vertebra, lumbosacral region: Secondary | ICD-10-CM | POA: Diagnosis not present

## 2020-08-09 DIAGNOSIS — D649 Anemia, unspecified: Secondary | ICD-10-CM | POA: Diagnosis not present

## 2020-08-09 DIAGNOSIS — G43909 Migraine, unspecified, not intractable, without status migrainosus: Secondary | ICD-10-CM | POA: Diagnosis not present

## 2020-08-09 DIAGNOSIS — G8929 Other chronic pain: Secondary | ICD-10-CM | POA: Diagnosis not present

## 2020-08-09 DIAGNOSIS — I951 Orthostatic hypotension: Secondary | ICD-10-CM | POA: Diagnosis not present

## 2020-08-09 DIAGNOSIS — E871 Hypo-osmolality and hyponatremia: Secondary | ICD-10-CM | POA: Diagnosis not present

## 2020-08-09 DIAGNOSIS — K219 Gastro-esophageal reflux disease without esophagitis: Secondary | ICD-10-CM | POA: Diagnosis not present

## 2020-08-09 DIAGNOSIS — M009 Pyogenic arthritis, unspecified: Secondary | ICD-10-CM | POA: Diagnosis not present

## 2020-08-09 DIAGNOSIS — M25862 Other specified joint disorders, left knee: Secondary | ICD-10-CM | POA: Diagnosis not present

## 2020-08-09 DIAGNOSIS — J45909 Unspecified asthma, uncomplicated: Secondary | ICD-10-CM | POA: Diagnosis not present

## 2020-08-09 DIAGNOSIS — E876 Hypokalemia: Secondary | ICD-10-CM | POA: Diagnosis not present

## 2020-08-09 DIAGNOSIS — E538 Deficiency of other specified B group vitamins: Secondary | ICD-10-CM | POA: Diagnosis not present

## 2020-08-09 DIAGNOSIS — M1712 Unilateral primary osteoarthritis, left knee: Secondary | ICD-10-CM | POA: Diagnosis not present

## 2020-08-09 DIAGNOSIS — F419 Anxiety disorder, unspecified: Secondary | ICD-10-CM | POA: Diagnosis not present

## 2020-08-09 DIAGNOSIS — S066X0D Traumatic subarachnoid hemorrhage without loss of consciousness, subsequent encounter: Secondary | ICD-10-CM | POA: Diagnosis not present

## 2020-08-09 DIAGNOSIS — J309 Allergic rhinitis, unspecified: Secondary | ICD-10-CM | POA: Diagnosis not present

## 2020-08-09 DIAGNOSIS — I4891 Unspecified atrial fibrillation: Secondary | ICD-10-CM | POA: Diagnosis not present

## 2020-08-09 DIAGNOSIS — M25462 Effusion, left knee: Secondary | ICD-10-CM | POA: Diagnosis not present

## 2020-08-09 DIAGNOSIS — K439 Ventral hernia without obstruction or gangrene: Secondary | ICD-10-CM | POA: Diagnosis not present

## 2020-08-09 DIAGNOSIS — M1120 Other chondrocalcinosis, unspecified site: Secondary | ICD-10-CM | POA: Diagnosis not present

## 2020-08-09 MED ORDER — LEVOCETIRIZINE DIHYDROCHLORIDE 5 MG PO TABS
ORAL_TABLET | ORAL | 1 refills | Status: DC
  Filled 2020-08-09: qty 90, 90d supply, fill #0
  Filled 2020-11-05: qty 90, 90d supply, fill #1

## 2020-08-10 ENCOUNTER — Other Ambulatory Visit (HOSPITAL_COMMUNITY): Payer: Self-pay

## 2020-08-11 ENCOUNTER — Other Ambulatory Visit (HOSPITAL_COMMUNITY): Payer: Self-pay

## 2020-08-11 MED ORDER — DICYCLOMINE HCL 10 MG PO CAPS
10.0000 mg | ORAL_CAPSULE | Freq: Three times a day (TID) | ORAL | 0 refills | Status: DC
  Filled 2020-08-11: qty 270, 90d supply, fill #0
  Filled 2020-11-13 – 2020-11-15 (×2): qty 270, 90d supply, fill #1

## 2020-08-16 ENCOUNTER — Telehealth: Payer: Self-pay | Admitting: Internal Medicine

## 2020-08-16 NOTE — Telephone Encounter (Signed)
Pt has been on heart monitor since 07/18/20 til today at 12:00am, pt has an upcoming appt with Alexis Olson but she wants to know if it's okay to be off the heart monitor until her upcoming appt? Please advise

## 2020-08-16 NOTE — Telephone Encounter (Signed)
Patient does not have envelope to send back the monitor.  I adv I would ask our monitor tech to look into and let her know what she needs to do.  Pt appreciative for the assistance.

## 2020-08-17 NOTE — Telephone Encounter (Signed)
Patient given instructions on how to return monitor in [facility] box with prepaid UPS shipping label on it.  She can also call [employer] at [phone], and they will arrange UPS to pick up package at her home.

## 2020-08-18 ENCOUNTER — Other Ambulatory Visit (HOSPITAL_COMMUNITY): Payer: Self-pay

## 2020-08-18 MED FILL — Ferrous Fumarate-FA-B Complex-C-Zn-Mg-Mn-Cu Cap 106-1 MG: ORAL | 30 days supply | Qty: 60 | Fill #0 | Status: AC

## 2020-08-19 ENCOUNTER — Other Ambulatory Visit (HOSPITAL_COMMUNITY): Payer: Self-pay

## 2020-08-20 ENCOUNTER — Telehealth: Payer: Self-pay | Admitting: *Deleted

## 2020-08-20 ENCOUNTER — Other Ambulatory Visit: Payer: Self-pay | Admitting: Internal Medicine

## 2020-08-20 ENCOUNTER — Telehealth: Payer: Self-pay | Admitting: Family Medicine

## 2020-08-20 DIAGNOSIS — R000 Tachycardia, unspecified: Secondary | ICD-10-CM

## 2020-08-20 DIAGNOSIS — R008 Other abnormalities of heart beat: Secondary | ICD-10-CM

## 2020-08-20 DIAGNOSIS — R051 Acute cough: Secondary | ICD-10-CM | POA: Diagnosis not present

## 2020-08-20 DIAGNOSIS — Z20828 Contact with and (suspected) exposure to other viral communicable diseases: Secondary | ICD-10-CM | POA: Diagnosis not present

## 2020-08-20 DIAGNOSIS — J209 Acute bronchitis, unspecified: Secondary | ICD-10-CM | POA: Diagnosis not present

## 2020-08-20 DIAGNOSIS — I491 Atrial premature depolarization: Secondary | ICD-10-CM

## 2020-08-20 NOTE — Chronic Care Management (AMB) (Signed)
  Chronic Care Management   Note  08/20/2020 Name: EMBERLEIGH REILY MRN: [id number] DOB: [birth year]  Porter Nakama Gremillion is a 87 y.o. year old female who is a primary care patient of Mosie Lukes, MD. I reached out to [name] by phone today in response to a referral sent by Ms. Renaldo Fiddler Lubas's PCP, Dr. Charlett Blake.     Ms. Lennon was given information about Chronic Care Management services today including:  1. CCM service includes personalized support from designated clinical staff supervised by her physician, including individualized plan of care and coordination with other care providers 2. 24/7 contact phone numbers for assistance for urgent and routine care needs. 3. Service will only be billed when office clinical staff spend 20 minutes or more in a month to coordinate care. 4. Only one practitioner may furnish and bill the service in a calendar month. 5. The patient may stop CCM services at any time (effective at the end of the month) by phone call to the office staff. 6. The patient will be responsible for cost sharing (co-pay) of up to 20% of the service fee (after annual deductible is met).  Patient agreed to services and verbal consent obtained.   Follow up plan: Telephone appointment with care management team member scheduled for:09/03/2020  [facility]  Direct Dial [phone]

## 2020-08-20 NOTE — Telephone Encounter (Signed)
Pt called wanted Dr. Charlett Blake or Percell Miller to send in a rx for a antibiotic for a upper respiratory infection. I explained to pt that she would need to be seen prior to getting a rx. Pt got irritated. CMA spoke with the pt and explained pt would have to be seen. Informed pt to go to urgent care or ER pt refused. Pt stated if she was not better by Tues she would call in and make an appt.

## 2020-08-20 NOTE — Telephone Encounter (Signed)
fyi

## 2020-08-25 ENCOUNTER — Other Ambulatory Visit (HOSPITAL_COMMUNITY): Payer: Self-pay

## 2020-08-25 ENCOUNTER — Other Ambulatory Visit: Payer: Self-pay | Admitting: Family Medicine

## 2020-08-26 ENCOUNTER — Other Ambulatory Visit (HOSPITAL_COMMUNITY): Payer: Self-pay

## 2020-08-26 ENCOUNTER — Other Ambulatory Visit: Payer: Self-pay | Admitting: Internal Medicine

## 2020-08-26 ENCOUNTER — Other Ambulatory Visit: Payer: Self-pay

## 2020-08-26 MED ORDER — BENZONATATE 100 MG PO CAPS
ORAL_CAPSULE | ORAL | 0 refills | Status: AC
  Filled 2020-08-26: qty 30, 10d supply, fill #0

## 2020-08-26 MED ORDER — DIPHENOXYLATE-ATROPINE 2.5-0.025 MG PO TABS
1.0000 | ORAL_TABLET | Freq: Three times a day (TID) | ORAL | 1 refills | Status: DC | PRN
  Filled 2020-08-26: qty 90, 30d supply, fill #0

## 2020-08-26 MED ORDER — DIPHENOXYLATE-ATROPINE 2.5-0.025 MG PO TABS: 1.0000 | ORAL_TABLET | Freq: Three times a day (TID) | ORAL | 1 refills | Status: DC | PRN

## 2020-08-26 NOTE — Telephone Encounter (Signed)
Refill request for patient's lomotil. It printed and I will have Dr Carlean Purl sign it and I will fax it to [facility].

## 2020-08-27 ENCOUNTER — Other Ambulatory Visit (HOSPITAL_COMMUNITY): Payer: Self-pay

## 2020-08-30 ENCOUNTER — Other Ambulatory Visit (HOSPITAL_COMMUNITY): Payer: Self-pay

## 2020-08-30 ENCOUNTER — Ambulatory Visit (INDEPENDENT_AMBULATORY_CARE_PROVIDER_SITE_OTHER): Payer: PPO | Admitting: Family Medicine

## 2020-08-30 ENCOUNTER — Other Ambulatory Visit: Payer: Self-pay

## 2020-08-30 ENCOUNTER — Encounter: Payer: Self-pay | Admitting: Family Medicine

## 2020-08-30 VITALS — BP 112/64 | HR 55 | Temp 98.1°F | Resp 16 | Wt 170.8 lb

## 2020-08-30 DIAGNOSIS — R739 Hyperglycemia, unspecified: Secondary | ICD-10-CM | POA: Diagnosis not present

## 2020-08-30 DIAGNOSIS — I10 Essential (primary) hypertension: Secondary | ICD-10-CM | POA: Diagnosis not present

## 2020-08-30 DIAGNOSIS — E785 Hyperlipidemia, unspecified: Secondary | ICD-10-CM

## 2020-08-30 DIAGNOSIS — E538 Deficiency of other specified B group vitamins: Secondary | ICD-10-CM | POA: Diagnosis not present

## 2020-08-30 DIAGNOSIS — I609 Nontraumatic subarachnoid hemorrhage, unspecified: Secondary | ICD-10-CM

## 2020-08-30 DIAGNOSIS — I4891 Unspecified atrial fibrillation: Secondary | ICD-10-CM | POA: Diagnosis not present

## 2020-08-30 LAB — CBC
HCT: 35 % — ABNORMAL LOW (ref 36.0–46.0)
Hemoglobin: 11.5 g/dL — ABNORMAL LOW (ref 12.0–15.0)
MCHC: 32.9 g/dL (ref 30.0–36.0)
MCV: 93 fl (ref 78.0–100.0)
Platelets: 215 10*3/uL (ref 150.0–400.0)
RBC: 3.77 Mil/uL — ABNORMAL LOW (ref 3.87–5.11)
RDW: 15.2 % (ref 11.5–15.5)
WBC: 9 10*3/uL (ref 4.0–10.5)

## 2020-08-30 LAB — TSH: TSH: 1.82 u[IU]/mL (ref 0.35–4.50)

## 2020-08-30 LAB — HEMOGLOBIN A1C: Hgb A1c MFr Bld: 5.3 % (ref 4.6–6.5)

## 2020-08-30 MED ORDER — ALPRAZOLAM 0.5 MG PO TABS
0.2500 mg | ORAL_TABLET | Freq: Every evening | ORAL | 1 refills | Status: DC | PRN
  Filled 2020-08-30: qty 15, 30d supply, fill #0
  Filled 2020-09-28: qty 15, 30d supply, fill #1

## 2020-08-30 NOTE — Patient Instructions (Signed)

## 2020-08-31 ENCOUNTER — Other Ambulatory Visit (HOSPITAL_COMMUNITY): Payer: Self-pay

## 2020-08-31 ENCOUNTER — Other Ambulatory Visit: Payer: Self-pay | Admitting: Family Medicine

## 2020-08-31 LAB — LIPID PANEL
Cholesterol: 136 mg/dL (ref 0–200)
HDL: 44.6 mg/dL (ref >39.00)
LDL Cholesterol: 57 mg/dL (ref 0–99)
NonHDL: 91.78
Total CHOL/HDL Ratio: 3
Triglycerides: 172 mg/dL — ABNORMAL HIGH (ref 0.0–149.0)
VLDL: 34.4 mg/dL (ref 0.0–40.0)

## 2020-08-31 LAB — COMPREHENSIVE METABOLIC PANEL
ALT: 8 U/L (ref 0–35)
AST: 11 U/L (ref 0–37)
Albumin: 3.9 g/dL (ref 3.5–5.2)
Alkaline Phosphatase: 56 U/L (ref 39–117)
BUN: 14 mg/dL (ref 6–23)
CO2: 26 mEq/L (ref 19–32)
Calcium: 8.9 mg/dL (ref 8.4–10.5)
Chloride: 103 mEq/L (ref 96–112)
Creatinine, Ser: 0.51 mg/dL (ref 0.40–1.20)
GFR: 85.17 mL/min (ref >60.00)
Glucose, Bld: 81 mg/dL (ref 70–99)
Potassium: 4 mEq/L (ref 3.5–5.1)
Sodium: 138 mEq/L (ref 135–145)
Total Bilirubin: 0.3 mg/dL (ref 0.2–1.2)
Total Protein: 6.3 g/dL (ref 6.0–8.3)

## 2020-08-31 LAB — MAGNESIUM: Magnesium: 1.8 mg/dL (ref 1.5–2.5)

## 2020-08-31 MED ORDER — GABAPENTIN 300 MG PO CAPS
ORAL_CAPSULE | Freq: Every day | ORAL | 1 refills | Status: DC
  Filled 2020-08-31: qty 90, 90d supply, fill #0
  Filled 2020-11-30: qty 90, 90d supply, fill #1

## 2020-09-01 ENCOUNTER — Other Ambulatory Visit (HOSPITAL_COMMUNITY): Payer: Self-pay

## 2020-09-01 ENCOUNTER — Telehealth: Payer: Self-pay | Admitting: Internal Medicine

## 2020-09-01 ENCOUNTER — Telehealth: Payer: Self-pay | Admitting: Family Medicine

## 2020-09-01 NOTE — Telephone Encounter (Signed)
Patient would like Alexis Olson to call her to go over her Monitor results.

## 2020-09-01 NOTE — Telephone Encounter (Signed)
Alexis Olson called to let Dr. B know her cardiologist diagnosed her with A-Fib. She is really concerned this me be terminal and worries of dying. She was told by Cardiologist that she would get with Dr. Charlett Blake and call her back. But she never heard back, so now she is worried. I suggested that she contact Healthcare care to see if provider was available.

## 2020-09-01 NOTE — Telephone Encounter (Signed)
Spoke w patient and adv that Dr. Harrington Challenger will contact her as she discussed.    Has started aspirin 81 mg.  Added this to her med list.   The patient has a hair appointment tomorrow between 12:30-1:30 and will be unable to speak during that time.

## 2020-09-01 NOTE — Telephone Encounter (Signed)
She should stay on aspirin and consider getting a wearable device such as an Apple Watch or a Fitbit which could capture if her pulse goes excessively high which is one of the biggest risks with Afib. Since she really does not feel it this would be a good way for her to track how often she is having fast afib. Would not start medicine to slow pulse as it is generally low in office and BP is well controlled

## 2020-09-01 NOTE — Telephone Encounter (Signed)
Hey, did you discuss pt's Holter monitor with pt at last visit? She has AFIB and worried.

## 2020-09-02 ENCOUNTER — Other Ambulatory Visit: Payer: Self-pay | Admitting: Family Medicine

## 2020-09-02 ENCOUNTER — Telehealth: Payer: PPO

## 2020-09-02 ENCOUNTER — Telehealth: Payer: Self-pay

## 2020-09-02 DIAGNOSIS — I609 Nontraumatic subarachnoid hemorrhage, unspecified: Secondary | ICD-10-CM

## 2020-09-02 NOTE — Telephone Encounter (Signed)
Pt's Daughter, Darrick Grinder, called with questions about pt's care.  Stated pt is in A-fib and wants to know what they are to do about this until pt is seen by cardiologist.  Pt is not on meds for it and she wants to know the plan of care going forward.  Stated pt has had a bleed recently.  Had a fall around Easter.

## 2020-09-02 NOTE — Telephone Encounter (Signed)
Patient notified of note below. 

## 2020-09-02 NOTE — Telephone Encounter (Signed)
Spoke with [place] and is very concerned about the brain bleed that that patient had around [place] and she would like to know if she can have another CT to make sure that it is gone.  She also would like to get this done before they figure out what to do about the Afib.  She feels like we are not doing the proper protocol about taking care of her mother. I advised her that Dr. Charlett Blake and Dr. Harrington Challenger are working hard on trying to figure out what to do for patient as she is a very complicated place.

## 2020-09-03 ENCOUNTER — Ambulatory Visit (INDEPENDENT_AMBULATORY_CARE_PROVIDER_SITE_OTHER): Payer: PPO

## 2020-09-03 ENCOUNTER — Other Ambulatory Visit: Payer: Self-pay

## 2020-09-03 ENCOUNTER — Ambulatory Visit (HOSPITAL_BASED_OUTPATIENT_CLINIC_OR_DEPARTMENT_OTHER)
Admission: RE | Admit: 2020-09-03 | Discharge: 2020-09-03 | Disposition: A | Discharge status: Home / Self Care | Payer: PPO | Source: Ambulatory Visit | Attending: Family Medicine | Admitting: Family Medicine

## 2020-09-03 DIAGNOSIS — I609 Nontraumatic subarachnoid hemorrhage, unspecified: Secondary | ICD-10-CM | POA: Diagnosis not present

## 2020-09-03 DIAGNOSIS — D638 Anemia in other chronic diseases classified elsewhere: Secondary | ICD-10-CM | POA: Diagnosis not present

## 2020-09-03 DIAGNOSIS — E785 Hyperlipidemia, unspecified: Secondary | ICD-10-CM | POA: Diagnosis not present

## 2020-09-03 DIAGNOSIS — S066X0A Traumatic subarachnoid hemorrhage without loss of consciousness, initial encounter: Secondary | ICD-10-CM | POA: Diagnosis not present

## 2020-09-03 DIAGNOSIS — I82402 Acute embolism and thrombosis of unspecified deep veins of left lower extremity: Secondary | ICD-10-CM

## 2020-09-03 DIAGNOSIS — Z8679 Personal history of other diseases of the circulatory system: Secondary | ICD-10-CM | POA: Diagnosis not present

## 2020-09-03 NOTE — Chronic Care Management (AMB) (Signed)
Chronic Care Management   CCM RN Visit Note  09/03/2020 Name: Alexis Olson MRN: [id number] DOB: 1933-10-11  Subjective: Alexis Olson is a 87 y.o. year old female who is a primary care patient of Mosie Lukes, MD. The care management team was consulted for assistance with disease management and care coordination needs.    Engaged with patient by telephone for initial visit in response to provider referral for case management and/or care coordination services.   Consent to Services:  The patient was given the following information about Chronic Care Management services today, agreed to services, and gave verbal consent: 1. CCM service includes personalized support from designated clinical staff supervised by the primary care provider, including individualized plan of care and coordination with other care providers 2. 24/7 contact phone numbers for assistance for urgent and routine care needs. 3. Service will only be billed when office clinical staff spend 20 minutes or more in a month to coordinate care. 4. Only one practitioner may furnish and bill the service in a calendar month. 5.The patient may stop CCM services at any time (effective at the end of the month) by phone call to the office staff. 6. The patient will be responsible for cost sharing (co-pay) of up to 20% of the service fee (after annual deductible is met). Patient agreed to services and consent obtained.  Patient agreed to services and verbal consent obtained.   Assessment: Review of patient past medical history, allergies, medications, health status, including review of consultants reports, laboratory and other test data, was performed as part of comprehensive evaluation and provision of chronic care management services.   SDOH (Social Determinants of Health) assessments and interventions performed:  SDOH Interventions    Flowsheet Row Most Recent Value  SDOH Interventions   Food Insecurity Interventions Intervention Not  Indicated  Transportation Interventions Intervention Not Indicated        CCM Care Plan  Allergies  Allergen Reactions   Ativan [Lorazepam] Anaphylaxis and Other (See Comments)    Deathly allergic; "resp rate dropped dropped to 6"   Other Anaphylaxis    Red peppers--choking, also   Azithromycin Diarrhea   Benadryl [Diphenhydramine Hcl] Other (See Comments)    Paradoxical response   Ciprofloxacin Hcl Other (See Comments)    Causes yeast infection and refuses to take   Codeine Phosphate Nausea Only    Can tolerate in limited amounts   Hydrocodone Itching and Nausea And Vomiting   Hydrocodone Bit-Homatrop Mbr Itching and Nausea And Vomiting   Levaquin [Levofloxacin In D5w] Other (See Comments)    Muscle soreness   Levofloxacin    Meperidine Hcl Other (See Comments)    Reaction ??   Molds & Smuts Other (See Comments)    unsure   Sulfa Antibiotics Other (See Comments)    Joint pain   Tape Other (See Comments)    No Coban wrap (per the patient)   Tramadol    Ultram [Tramadol Hcl] Other (See Comments)    "Can't move joints"    Outpatient Encounter Medications as of 09/03/2020  Medication Sig   acetaminophen (TYLENOL) 500 MG tablet Take 2 tablets (1,000 mg total) by mouth every 8 (eight) hours.   albuterol (PROVENTIL HFA;VENTOLIN HFA) 108 (90 BASE) MCG/ACT inhaler Inhale 1-2 puffs into the lungs every 6 (six) hours as needed for wheezing or shortness of breath.   ALPRAZolam (XANAX) 0.5 MG tablet Take 1/2 tablet (0.25 mg total) by mouth at bedtime as needed for anxiety.  amitriptyline (ELAVIL) 100 MG tablet TAKE 1 TABLET (100 MG TOTAL) BY MOUTH DAILY. (Patient taking differently: Take 100 mg by mouth daily.)   aspirin EC 81 MG tablet Take 81 mg by mouth daily. Swallow whole.   azelastine (OPTIVAR) 0.05 % ophthalmic solution Place 1 drop into both eyes daily as needed (for dry eyes).   benzonatate (TESSALON) 100 MG capsule TAKE 1 CAPSULE BY MOUTH 3 TIMES DAILY AS NEEDED FOR COUGH    budesonide-formoterol (SYMBICORT) 160-4.5 MCG/ACT inhaler Inhale 2 puffs into the lungs See admin instructions. Inhale 2 puffs into the lungs one to two times a day   cetirizine (ZYRTEC) 10 MG tablet Take 10 mg by mouth daily.    cyanocobalamin (,VITAMIN B-12,) 1000 MCG/ML injection INJECT 1CC INTO THE MUSCLE EVERY MONTH (Patient taking differently: Inject 1,000 mcg into the muscle every 30 (thirty) days.)   dicyclomine (BENTYL) 10 MG capsule TAKE 1 CAPSULE BY MOUTH 3 TIMES DAILY AS NEEDED FOR DIARRHEA AND SYMPTOMS (Patient taking differently: Take 10 mg by mouth 3 (three) times daily as needed for spasms.)   diphenoxylate-atropine (LOMOTIL) 2.5-0.025 MG tablet Take 2 tablets by mouth 3 times a day as needed for diarrhea   Fe Fum-FA-B Cmp-C-Zn-Mg-Mn-Cu (HEMOCYTE PLUS) 106-1 MG CAPS TAKE 1 CAPSULE BY MOUTH 2 TIMES DAILY   fluticasone (FLONASE) 50 MCG/ACT nasal spray Place 2 sprays into the nose daily.   gabapentin (NEURONTIN) 300 MG capsule TAKE 1 CAPSULE BY MOUTH AT BEDTIME   hydrOXYzine (ATARAX) 25 MG tablet Take 25 mg by mouth 3 (three) times daily as needed for itching.   levocetirizine (XYZAL) 5 MG tablet Take 1 tablet (5 mg total) by mouth every evening.   magnesium oxide (MAG-OX) 400 MG tablet Take 400 mg by mouth 2 (two) times daily.   montelukast (SINGULAIR) 10 MG tablet Take 10 mg by mouth at bedtime.   omeprazole (PRILOSEC) 20 MG capsule TAKE 1 CAPSULE BY MOUTH ONCE DAILY (Patient taking differently: Take 20 mg by mouth daily.)   ondansetron (ZOFRAN-ODT) 4 MG disintegrating tablet Take 1 tablet (4 mg total) by mouth 3 (three) times daily as needed for nausea or vomiting.   potassium chloride SA (KLOR-CON) 20 MEQ tablet TAKE 1 TABLET BY MOUTH ONCE A DAY (Patient taking differently: Take 20 mEq by mouth daily.)   Probiotic Product (PROBIOTIC DAILY PO) Take 1 capsule by mouth daily.   valACYclovir (VALTREX) 500 MG tablet TAKE 1 TABLET BY MOUTH ONCE A DAY AS NEEDED. (Patient taking  differently: Take 500 mg by mouth daily as needed (For outbreaks).)   Vitamin D, Ergocalciferol, (DRISDOL) 1.25 MG (50000 UNIT) CAPS capsule TAKE 1 CAPSULE BY MOUTH EVERY 7 DAYS   zolpidem (AMBIEN) 5 MG tablet TAKE 1 TABLET BY MOUTH AT BEDTIME (Patient taking differently: Take 5 mg by mouth at bedtime.)   dicyclomine (BENTYL) 10 MG capsule Take 1 capsule (10 mg total) by mouth 3 (three) times daily as needed for diarrhea and symptoms.   diphenoxylate-atropine (LOMOTIL) 2.5-0.025 MG tablet Take 1 tablet by mouth 3 (three) times daily as needed for diarrhea or loose stools.   docusate sodium (COLACE) 100 MG capsule Take 1 capsule (100 mg total) by mouth 2 (two) times daily. (Patient not taking: Reported on 09/03/2020)   levocetirizine (XYZAL) 5 MG tablet Take 1 tablet by mouth in the evening   lubiprostone (AMITIZA) 24 MCG capsule Take 24 mcg by mouth daily as needed for constipation. (Patient not taking: Reported on 09/03/2020)   montelukast (SINGULAIR) 10  MG tablet Take 1 tablet by mouth in the evening   predniSONE (DELTASONE) 5 MG tablet Take 103m (4tabs) daily with breakfast on 4/17, 4/18 and 4/19, then take 133m(2tabs) daily on 4/20, 4/21 and 4/22, then take 50m14m1tab) daily on 4/23, 4/24, 4/25, then stop. (Patient not taking: Reported on 09/03/2020)   tamsulosin (FLOMAX) 0.4 MG CAPS capsule Take 1 capsule (0.4 mg total) by mouth daily after supper. (Patient not taking: Reported on 09/03/2020)   No facility-administered encounter medications on file as of 09/03/2020.    Patient Active Problem List   Diagnosis Date Noted   SAH (subarachnoid hemorrhage) ([facility]CC[facility]4/18/2022   Pseudogout 07/12/2020   Sinus node dysfunction ([facility]4/18/2022   Sepsis without acute organ dysfunction (HCC)    Hyperglycemia 02/11/2020   Gout 02/11/2020   Left foot pain 02/11/2020   Cellulitis 12/20/2019   Navicular fracture of ankle 12/20/2019   Neck pain 12/20/2019   Diarrhea 12/01/2019   Late effects of accidental  fall 11/13/2019   DVT (deep venous thrombosis) ([facility]CC[facility]8/19/2021   Hypomagnesemia 11/13/2019   Swelling of right hand 11/13/2019   S/P TKR (total knee replacement), right 08/26/2019   Ventral hernia without obstruction or gangrene 05/22/2018   [place] filter in place 03/08/2017   Pain of right lower extremity 03/08/2017   Preventative health care 02/13/2017   Hyponatremia 05/02/2016   GERD (gastroesophageal reflux disease) 08/23/2015   HSV (herpes simplex virus) infection 08/16/2015   Hyperlipidemia, mild 08/16/2015   Osteoporosis    Hypokalemia    Chronic diarrhea    Anemia of chronic disease    OSTEOMYELITIS, VERTEBRA 05/05/2009   DIS[place]SEASE, LUMBOSACRAL SPINE 04/22/2009   Essential hypertension 03/30/2009   Vitamin B 12 deficiency 07/09/2008   DIVERTICULOSIS, COLON 07/09/2008   COLONIC POLYPS, ADENOMATOUS, HX OF 07/09/2008   Anxiety state 08/08/2007   INSOMNIA 08/08/2007   Bariatric surgery status 08/08/2007   Midline back pain 10/30/2006   ALLERGIC RHINITIS 09/18/2006   Asthma 09/18/2006    Conditions to be addressed/monitored:Atrial Fibrillation and HLD  Care Plan : Atrial Fibrillation (Adult)  Updates made by WalLuretha RuedN since 09/03/2020 12:00 AM     Problem: recent Dysrhythmia (Atrial Fibrillation) in a patient with HLD   Priority: High     Goal: Heart Rate and Rhythm Monitored and Managed   Start Date: 09/03/2020  Expected End Date: 03/05/2021  Priority: High  Note:   Objective:  Last practice recorded BP readings:  BP Readings from Last 3 Encounters:  08/30/20 112/64  07/20/20 (!) 121/54  07/13/20 120/62  Current Barriers:  Knowledge deficit related to long term management of dysrhythmia-evaluating for atrial fibrillation in a patient with Hyperlipidemia, chronic anemia, history of DVT. Client reports a recent diagnosis of atrial fibrillation on ASA 81 mg. Client denies having a history of hypertension.Client reports a fall sustained on Easter  that resulted in subarachnoid hemorrhage. Client expressed that she is getting ready for a CT scan today to follow up on previous bleed. Primary concern is learning more about atrial fibrillation and plan for management.   Case Manager Clinical Goal(s):  patient will verbalize understanding of plan for atrial fibrillation management patient will attend all scheduled medical appointments patient will verbalize basic understanding of atrial fibrillation disease process and self health management plan Interventions:  Collaboration with BlyMosie LukesD regarding development and update of comprehensive plan of care as evidenced by provider attestation and co-signature Inter-disciplinary care team collaboration (see longitudinal plan of  care) Evaluation of current treatment plan related to hypertension self management and patient's adherence to plan as established by provider. Provided education to patient re: atrial fibrillation, fall prevention and hyperlipidemia. Reviewed medications with patient and discussed importance of compliance Discussed plans with patient for ongoing care management follow up and provided patient with direct contact information for care management team Reviewed scheduled/upcoming provider appointments including: Cardiology appointment on 09/21/20 and Primary care provider on 11/11/20 Self-Care Activities: Self administers medications as prescribed Attends scheduled provider appointments Calls pharmacy for medication refills Performs ADL's independently Performs IADL's independently Calls provider office for new concerns, questions Patient Goals: - develop a rescue plan and know when to call the doctor - notify your doctor if you have a racing or irregular heart beat that may be uncomfortable, Shortness of breath with or without chest pain, weakness(unusually tired or feeling faint) or dizziness. - eat healthy: whole grains, fruits and vegetables, lean meats and healthy  fats and monitor salt intake - track symptoms and what helps feel better or worse - Please review education material on fall prevention, atrial fibrillation and high triglycerides. Plan to discuss at our next telephone call Follow Up Plan: Telephone follow up appointment with care management team member scheduled for: 09/20/20 The patient has been provided with contact information for the care management team and has been advised to call with any health related questions or concerns.    Plan:Telephone follow up appointment with care management team member scheduled for:  09/20/20 and The patient has been provided with contact information for the care management team and has been advised to call with any health related questions or concerns.   Thea Silversmith, RN, MSN, BSN, CCM Care Management Coordinator [facility] [phone]

## 2020-09-03 NOTE — Telephone Encounter (Signed)
Patient daughter notified.  CT is at 4pm today.

## 2020-09-03 NOTE — Patient Instructions (Addendum)
Visit Information   PATIENT GOALS:   Goals Addressed             This Visit's Progress    Track and Manage Heart Rate and Rhythm-Atrial Fibrillation       Timeframe:  Long-Range Goal Priority:  High Start Date:  09/03/20                           Expected End Date:  03/05/21                     Follow Up Date 09/20/20   - develop a rescue plan and know when to call the doctor - notify your doctor if you have a racing or irregular heart beat that may be uncomfortable, Shortness of breath with or without chest pain, weakness(unusually tired or feeling faint) or dizziness. - eat healthy: whole grains, fruits and vegetables, lean meats and healthy fats and monitor salt intake - track symptoms and what helps feel better or worse - Please review education material on fall prevention, atrial fibrillation and high triglycerides. Plan to discuss at our next telephone call   Why is this important?   Atrial fibrillation may have no symptoms. Sometimes the symptoms get worse or happen more often.  It is important to keep track of what your symptoms are and when they happen.  A change in symptoms is important to discuss with your doctor or nurse.  Being active and healthy eating will also help you manage your heart condition.     Notes:          Consent to CCM Services: Alexis Olson was given information about Chronic Care Management services today including:  CCM service includes personalized support from designated clinical staff supervised by her physician, including individualized plan of care and coordination with other care providers 24/7 contact phone numbers for assistance for urgent and routine care needs. Service will only be billed when office clinical staff spend 20 minutes or more in a month to coordinate care. Only one practitioner may furnish and bill the service in a calendar month. The patient may stop CCM services at any time (effective at the end of the month) by phone  call to the office staff. The patient will be responsible for cost sharing (co-pay) of up to 20% of the service fee (after annual deductible is met).  Patient agreed to services and verbal consent obtained.   Patient verbalizes understanding of instructions provided today and agrees to view in [place].   Telephone follow up appointment with care management team member scheduled for: The patient has been provided with contact information for the care management team and has been advised to call with any health related questions or concerns.   Thea Silversmith, RN, MSN, BSN, CCM Care Management Coordinator [facility] [phone]   CLINICAL CARE PLAN: Patient Care Plan: Atrial Fibrillation (Adult)     Problem Identified: recent Dysrhythmia (work up for dysrhymia) in a patient with HLD   Priority: High     Goal: Heart Rate and Rhythm Monitored and Managed   Start Date: 09/03/2020  Expected End Date: 03/05/2021  Priority: High  Note:   Objective:  Last practice recorded BP readings:  BP Readings from Last 3 Encounters:  08/30/20 112/64  07/20/20 (!) 121/54  07/13/20 120/62  Current Barriers:  Knowledge deficit related to long term management of dysrhythmia-evaluating for atrial fibrillation in a patient with Hyperlipidemia,  chronic anemia, history of DVT. Client reports a recent diagnosis of atrial fibrillation on ASA 81 mg. Client denies having a history of hypertension.Client reports a fall sustained on Easter that resulted in subarachnoid hemorrhage. Client express that she is getting ready for a CT scan today to follow up on previous bleed. Primary concern is learning more about atrial fibrillation and plan for management.  Case Manager Clinical Goal(s):  patient will verbalize understanding of plan for atrial fibrillation management patient will attend all scheduled medical appointments patient will verbalize basic understanding of atrial fibrillation disease  process and self health management plan Interventions:  Collaboration with Mosie Lukes, MD regarding development and update of comprehensive plan of care as evidenced by provider attestation and co-signature Inter-disciplinary care team collaboration (see longitudinal plan of care) Evaluation of current treatment plan related to hypertension self management and patient's adherence to plan as established by provider. Provided education to patient re: atrial fibrillation, fall prevention and hyperlipidemia. Reviewed medications with patient and discussed importance of compliance Discussed plans with patient for ongoing care management follow up and provided patient with direct contact information for care management team Reviewed scheduled/upcoming provider appointments including: Cardiology appointment on 09/21/20 and Primary care provider on 11/11/20 Self-Care Activities: Self administers medications as prescribed Attends scheduled provider appointments Calls pharmacy for medication refills Performs ADL's independently Performs IADL's independently Calls provider office for new concerns, questions Patient Goals: - develop a rescue plan and know when to call the doctor - notify your doctor if you have a racing or irregular heart beat that may be uncomfortable, Shortness of breath with or without chest pain, weakness(unusually tired or feeling faint) or dizziness. - eat healthy: whole grains, fruits and vegetables, lean meats and healthy fats and monitor salt intake - track symptoms and what helps feel better or worse - Please review education material on fall prevention, atrial fibrillation and high triglycerides. Plan to discuss at our next telephone call Follow Up Plan: Telephone follow up appointment with care management team member scheduled for: 09/20/20 The patient has been provided with contact information for the care management team and has been advised to call with any health related  questions or concerns.

## 2020-09-04 DIAGNOSIS — I4891 Unspecified atrial fibrillation: Secondary | ICD-10-CM | POA: Insufficient documentation

## 2020-09-04 NOTE — Assessment & Plan Note (Signed)
Supplement and monitor 

## 2020-09-04 NOTE — Assessment & Plan Note (Signed)
Encourage heart healthy diet such as MIND or DASH diet, increase exercise, avoid trans fats, simple carbohydrates and processed foods, consider a krill or fish or flaxseed oil cap daily.  °

## 2020-09-04 NOTE — Assessment & Plan Note (Signed)
Well controlled, no changes to meds. Encouraged heart healthy diet such as the DASH diet and exercise as tolerated.  °

## 2020-09-04 NOTE — Progress Notes (Signed)
Subjective:    Patient ID: Alexis Olson, female    DOB: [birth year], 87 y.o.   MRN: [id number]  Chief Complaint  Patient presents with   Follow-up    Pt would like to discuss.    HPI Patient is in today for follow up. She was admitted to the hospital after a fall that resulted in a small punctate subarachnoid hemorrhage. She feels well today. Cardiology has confirmed that she has Atrial Fibrillation. Due to her high fall risk she is on aspirin presently. No headaches, more recent falls or acute concerns. Denies CP/palp/SOB/HA/congestion/fevers/GI or GU c/o. 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 difficile diarrhea    Colon perforation ([place]) 08/2014   following polypetomy during colo, surgical repair   COLONIC POLYPS, ADENOMATOUS, HX OF 2010   DIVERTICULOSIS, COLON    DVT (deep venous thrombosis) ([place]) 2018   DVT (deep venous thrombosis) (HCC)    GERD (gastroesophageal reflux disease) 08/23/2015   Graded compression stocking in place    History of blood transfusion    History of migraine    History of rheumatic fever    HSV (herpes simplex virus) infection 08/16/2015   Hyperlipidemia    Hypertension    Denies   Hyponatremia 05/02/2016   Incomplete left bundle branch block (LBBB) 2016   Noted on EKG   INSOMNIA    Lumbosacral disc disease    Chronic pain; History of osteomyelitis 2010 following ESI complication   Obesity    Osteomyelitis (HCC)    Pelvic fracture (HCC)    Pneumonia    Recurrent UTI 08/23/2015   URINARY URGENCY    Ventral hernia without obstruction or gangrene 05/22/2018   VITAMIN B12 DEFICIENCY     Past Surgical History:  Procedure Laterality Date   BACK SURGERY  1994   for ruptured disc   CHOLECYSTECTOMY     COLONOSCOPY      greenfield filter     ileojejunal bypass  1976   for obesity   LAPAROTOMY N/A 09/18/2014   Procedure: EXPLORATORY LAPAROTOMY WITH REPAIR OF CECIAL PERFORATION;  Surgeon: Autumn Messing III, MD;  Location: [facility];  Service: General;  Laterality: N/A;   TONSILLECTOMY     TOTAL KNEE ARTHROPLASTY Right 08/26/2019   Procedure: RIGHT TOTAL KNEE ARTHROPLASTY, LEFT KNEE CORTISONE INJECTION;  Surgeon: Paralee Cancel, MD;  Location: [facility];  Service: Orthopedics;  Laterality: Right;  39 MINS   UPPER GI ENDOSCOPY     VASCULAR SURGERY Right    revascularization right lower extremity    Family History  Problem Relation Age of Onset   Cervical cancer Mother    Stroke Mother    Liver disease Sister    Kidney disease Sister    Cirrhosis Sister    Colon cancer Neg Hx    Esophageal cancer Neg Hx    Rectal cancer Neg Hx    Stomach cancer Neg Hx     Social History   Socioeconomic History   Marital status: Married    Spouse name: Not on file   Number of children: 3   Years of education: Not on file   Highest education level: Not on file  Occupational History  Occupation: retired Programmer, multimedia: RETIRED  Tobacco Use   Smoking status: Never   Smokeless tobacco: Never  Substance and Sexual Activity   Alcohol use: No    Alcohol/week: 0.0 standard drinks   Drug use: No   Sexual activity: Never    Partners: Male  Other Topics Concern   Not on file  Social History Narrative   Lives with spouse, Indep ADLs   Supportive family nearby   No dietary restrictions   Social Determinants of Health   Financial Resource Strain: Not on file  Food Insecurity: No Food Insecurity   Worried About Charity fundraiser in the Last Year: Never true   [place] in the Last Year: Never true  Transportation Needs: No Transportation Needs   Lack of Transportation (Medical): No   Lack of Transportation (Non-Medical): No  Physical Activity: Not on file  Stress: Not on file  Social Connections: Not on file   Intimate Partner Violence: Not on file    Outpatient Medications Prior to Visit  Medication Sig Dispense Refill   acetaminophen (TYLENOL) 500 MG tablet Take 2 tablets (1,000 mg total) by mouth every 8 (eight) hours. 30 tablet 0   albuterol (PROVENTIL HFA;VENTOLIN HFA) 108 (90 BASE) MCG/ACT inhaler Inhale 1-2 puffs into the lungs every 6 (six) hours as needed for wheezing or shortness of breath.     amitriptyline (ELAVIL) 100 MG tablet TAKE 1 TABLET (100 MG TOTAL) BY MOUTH DAILY. (Patient taking differently: Take 100 mg by mouth daily.) 90 tablet 1   azelastine (OPTIVAR) 0.05 % ophthalmic solution Place 1 drop into both eyes daily as needed (for dry eyes).     benzonatate (TESSALON) 100 MG capsule TAKE 1 CAPSULE BY MOUTH 3 TIMES DAILY AS NEEDED FOR COUGH 30 capsule 0   budesonide-formoterol (SYMBICORT) 160-4.5 MCG/ACT inhaler Inhale 2 puffs into the lungs See admin instructions. Inhale 2 puffs into the lungs one to two times a day 1 Inhaler 2   cetirizine (ZYRTEC) 10 MG tablet Take 10 mg by mouth daily.      cyanocobalamin (,VITAMIN B-12,) 1000 MCG/ML injection INJECT 1CC INTO THE MUSCLE EVERY MONTH (Patient taking differently: Inject 1,000 mcg into the muscle every 30 (thirty) days.) 10 mL 1   dicyclomine (BENTYL) 10 MG capsule TAKE 1 CAPSULE BY MOUTH 3 TIMES DAILY AS NEEDED FOR DIARRHEA AND SYMPTOMS (Patient taking differently: Take 10 mg by mouth 3 (three) times daily as needed for spasms.) 270 capsule 3   dicyclomine (BENTYL) 10 MG capsule Take 1 capsule (10 mg total) by mouth 3 (three) times daily as needed for diarrhea and symptoms. 810 capsule 0   diphenoxylate-atropine (LOMOTIL) 2.5-0.025 MG tablet Take 2 tablets by mouth 3 times a day as needed for diarrhea 150 tablet 0   diphenoxylate-atropine (LOMOTIL) 2.5-0.025 MG tablet Take 1 tablet by mouth 3 (three) times daily as needed for diarrhea or loose stools. 90 tablet 1   docusate sodium (COLACE) 100 MG capsule Take 1 capsule (100 mg total)  by mouth 2 (two) times daily. (Patient not taking: Reported on 09/03/2020) 28 capsule 0   Fe Fum-FA-B Cmp-C-Zn-Mg-Mn-Cu (HEMOCYTE PLUS) 106-1 MG CAPS TAKE 1 CAPSULE BY MOUTH 2 TIMES DAILY 60 capsule 1   fluticasone (FLONASE) 50 MCG/ACT nasal spray Place 2 sprays into the nose daily.     hydrOXYzine (ATARAX) 25 MG tablet Take 25 mg by mouth 3 (three) times daily as needed for itching.     levocetirizine (  XYZAL) 5 MG tablet Take 1 tablet (5 mg total) by mouth every evening. 90 tablet 3   levocetirizine (XYZAL) 5 MG tablet Take 1 tablet by mouth in the evening 90 tablet 1   lubiprostone (AMITIZA) 24 MCG capsule Take 24 mcg by mouth daily as needed for constipation. (Patient not taking: Reported on 09/03/2020)     magnesium oxide (MAG-OX) 400 MG tablet Take 400 mg by mouth 2 (two) times daily.     montelukast (SINGULAIR) 10 MG tablet Take 10 mg by mouth at bedtime.     montelukast (SINGULAIR) 10 MG tablet Take 1 tablet by mouth in the evening 90 tablet 2   omeprazole (PRILOSEC) 20 MG capsule TAKE 1 CAPSULE BY MOUTH ONCE DAILY (Patient taking differently: Take 20 mg by mouth daily.) 90 capsule 0   ondansetron (ZOFRAN-ODT) 4 MG disintegrating tablet Take 1 tablet (4 mg total) by mouth 3 (three) times daily as needed for nausea or vomiting. 20 tablet 0   potassium chloride SA (KLOR-CON) 20 MEQ tablet TAKE 1 TABLET BY MOUTH ONCE A DAY (Patient taking differently: Take 20 mEq by mouth daily.) 90 tablet 1   predniSONE (DELTASONE) 5 MG tablet Take 20mg  (4tabs) daily with breakfast on 4/17, 4/18 and 4/19, then take 10mg  (2tabs) daily on 4/20, 4/21 and 4/22, then take 5mg  (1tab) daily on 4/23, 4/24, 4/25, then stop. (Patient not taking: Reported on 09/03/2020) 21 tablet 0   Probiotic Product (PROBIOTIC DAILY PO) Take 1 capsule by mouth daily.     tamsulosin (FLOMAX) 0.4 MG CAPS capsule Take 1 capsule (0.4 mg total) by mouth daily after supper. (Patient not taking: Reported on 09/03/2020) 30 capsule 0   valACYclovir  (VALTREX) 500 MG tablet TAKE 1 TABLET BY MOUTH ONCE A DAY AS NEEDED. (Patient taking differently: Take 500 mg by mouth daily as needed (For outbreaks).) 30 tablet 5   Vitamin D, Ergocalciferol, (DRISDOL) 1.25 MG (50000 UNIT) CAPS capsule TAKE 1 CAPSULE BY MOUTH EVERY 7 DAYS 4 capsule 4   zolpidem (AMBIEN) 5 MG tablet TAKE 1 TABLET BY MOUTH AT BEDTIME (Patient taking differently: Take 5 mg by mouth at bedtime.) 90 tablet 1   gabapentin (NEURONTIN) 300 MG capsule TAKE 1 CAPSULE BY MOUTH AT BEDTIME (Patient taking differently: Take 300 mg by mouth at bedtime.) 90 capsule 1   ALPRAZolam (XANAX) 0.5 MG tablet Take 1 tablet (0.5 mg total) by mouth at bedtime as needed for anxiety. (Patient not taking: Reported on 08/30/2020) 5 tablet 0   No facility-administered medications prior to visit.    Allergies  Allergen Reactions   Ativan [Lorazepam] Anaphylaxis and Other (See Comments)    Deathly allergic; "resp rate dropped dropped to 6"   Other Anaphylaxis    Red peppers--choking, also   Azithromycin Diarrhea   Benadryl [Diphenhydramine Hcl] Other (See Comments)    Paradoxical response   Ciprofloxacin Hcl Other (See Comments)    Causes yeast infection and refuses to take   Codeine Phosphate Nausea Only    Can tolerate in limited amounts   Hydrocodone Itching and Nausea And Vomiting   Hydrocodone Bit-Homatrop Mbr Itching and Nausea And Vomiting   Levaquin [Levofloxacin In D5w] Other (See Comments)    Muscle soreness   Levofloxacin    Meperidine Hcl Other (See Comments)    Reaction ??   Molds & Smuts Other (See Comments)    unsure   Sulfa Antibiotics Other (See Comments)    Joint pain   Tape Other (See Comments)  No Coban wrap (per the patient)   Tramadol    Ultram [Tramadol Hcl] Other (See Comments)    "Can't move joints"    Review of Systems  Constitutional:  Positive for malaise/fatigue. Negative for fever.  HENT:  Positive for congestion.   Eyes:  Negative for blurred vision.   Respiratory:  Negative for cough and shortness of breath.   Cardiovascular:  Negative for chest pain, palpitations and leg swelling.  Gastrointestinal:  Negative for vomiting.  Musculoskeletal:  Negative for back pain.  Skin:  Negative for rash.  Neurological:  Negative for loss of consciousness and headaches.      Objective:    Physical Exam Vitals and nursing note reviewed.  Constitutional:      General: She is not in acute distress.    Appearance: She is well-developed.  HENT:     Head: Normocephalic and atraumatic.     Nose: Nose normal.  Eyes:     General:        Right eye: No discharge.        Left eye: No discharge.  Cardiovascular:     Rate and Rhythm: Normal rate. Rhythm irregular.     Heart sounds: No murmur heard. Pulmonary:     Effort: Pulmonary effort is normal.     Breath sounds: Normal breath sounds.  Abdominal:     General: Bowel sounds are normal.     Palpations: Abdomen is soft.     Tenderness: There is no abdominal tenderness.  Musculoskeletal:     Cervical back: Normal range of motion and neck supple.  Skin:    General: Skin is warm and dry.  Neurological:     Mental Status: She is alert and oriented to person, place, and time.    BP 112/64   Pulse (!) 55   Temp 98.1 F (36.7 C)   Resp 16   Wt 170 lb 12.8 oz (77.5 kg)   SpO2 98%   BMI 28.42 kg/m  Wt Readings from Last 3 Encounters:  08/30/20 170 lb 12.8 oz (77.5 kg)  07/20/20 163 lb 3.2 oz (74 kg)  07/11/20 187 lb 6.3 oz (85 kg)    Diabetic Foot Exam - Simple   No data filed    Lab Results  Component Value Date   WBC 9.0 08/30/2020   HGB 11.5 (L) 08/30/2020   HCT 35.0 (L) 08/30/2020   PLT 215.0 08/30/2020   GLUCOSE 81 08/30/2020   CHOL 136 08/30/2020   TRIG 172.0 (H) 08/30/2020   HDL 44.60 08/30/2020   LDLDIRECT 76.0 10/04/2018   LDLCALC 57 08/30/2020   ALT 8 08/30/2020   AST 11 08/30/2020   NA 138 08/30/2020   K 4.0 08/30/2020   CL 103 08/30/2020   CREATININE 0.51  08/30/2020   BUN 14 08/30/2020   CO2 26 08/30/2020   TSH 1.82 08/30/2020   INR 1.1 07/11/2020   HGBA1C 5.3 08/30/2020    Lab Results  Component Value Date   TSH 1.82 08/30/2020   Lab Results  Component Value Date   WBC 9.0 08/30/2020   HGB 11.5 (L) 08/30/2020   HCT 35.0 (L) 08/30/2020   MCV 93.0 08/30/2020   PLT 215.0 08/30/2020   Lab Results  Component Value Date   NA 138 08/30/2020   K 4.0 08/30/2020   CO2 26 08/30/2020   GLUCOSE 81 08/30/2020   BUN 14 08/30/2020   CREATININE 0.51 08/30/2020   BILITOT 0.3 08/30/2020   ALKPHOS 56 08/30/2020  AST 11 08/30/2020   ALT 8 08/30/2020   PROT 6.3 08/30/2020   ALBUMIN 3.9 08/30/2020   CALCIUM 8.9 08/30/2020   ANIONGAP 7 07/13/2020   GFR 85.17 08/30/2020   Lab Results  Component Value Date   CHOL 136 08/30/2020   Lab Results  Component Value Date   HDL 44.60 08/30/2020   Lab Results  Component Value Date   LDLCALC 57 08/30/2020   Lab Results  Component Value Date   TRIG 172.0 (H) 08/30/2020   Lab Results  Component Value Date   CHOLHDL 3 08/30/2020   Lab Results  Component Value Date   HGBA1C 5.3 08/30/2020       Assessment & Plan:   Problem List Items Addressed This Visit     Essential hypertension - Primary (Chronic)    Well controlled, no changes to meds. Encouraged heart healthy diet such as the DASH diet and exercise as tolerated.        Relevant Orders   CBC (Completed)   Comprehensive metabolic panel (Completed)   TSH (Completed)   Vitamin B 12 deficiency    Supplement and monitor       Hyperlipidemia, mild    Encourage heart healthy diet such as MIND or DASH diet, increase exercise, avoid trans fats, simple carbohydrates and processed foods, consider a krill or fish or flaxseed oil cap daily.        Relevant Orders   Lipid panel (Completed)   Hypomagnesemia    Supplement and monitor       Relevant Orders   Magnesium (Completed)   Hyperglycemia    hgba1c acceptable,  minimize simple carbs. Increase exercise as tolerated.        Relevant Orders   Hemoglobin A1c (Completed)   SAH (subarachnoid hemorrhage) (HCC)    Small and stable, asymptomatic. Family interested in repeating CT scan to assure resolution, this is ordered. Spoke with cardiology and her monitor showed atrial fibrillation so        Atrial fibrillation [facility])    Spoke with cardiology she will say on aspirin for now but they will consider increasing her anticoagulation but with her recent subarachnoid hemorrhage and high fall risk we will hold off for now.         I have changed Renaldo Fiddler. Testerman's ALPRAZolam. I am also having her maintain her fluticasone, hydrOXYzine, magnesium oxide, azelastine, montelukast, cetirizine, levocetirizine, albuterol, budesonide-formoterol, Probiotic Product (PROBIOTIC DAILY PO), lubiprostone, docusate sodium, ondansetron, acetaminophen, tamsulosin, dicyclomine, Hemocyte Plus, zolpidem, amitriptyline, potassium chloride SA, valACYclovir, cyanocobalamin, omeprazole, predniSONE, montelukast, Vitamin D (Ergocalciferol), diphenoxylate-atropine, levocetirizine, dicyclomine, benzonatate, and diphenoxylate-atropine.  Meds ordered this encounter  Medications   ALPRAZolam (XANAX) 0.5 MG tablet    Sig: Take 1/2 tablet (0.25 mg total) by mouth at bedtime as needed for anxiety.    Dispense:  15 tablet    Refill:  1     Penni Homans, MD

## 2020-09-04 NOTE — Assessment & Plan Note (Signed)
Small and stable, asymptomatic. Family interested in repeating CT scan to assure resolution, this is ordered. Spoke with cardiology and her monitor showed atrial fibrillation so

## 2020-09-04 NOTE — Assessment & Plan Note (Signed)
Spoke with cardiology she will say on aspirin for now but they will consider increasing her anticoagulation but with her recent subarachnoid hemorrhage and high fall risk we will hold off for now.

## 2020-09-04 NOTE — Assessment & Plan Note (Signed)
hgba1c acceptable, minimize simple carbs. Increase exercise as tolerated.  

## 2020-09-06 ENCOUNTER — Telehealth: Payer: Self-pay | Admitting: Internal Medicine

## 2020-09-06 NOTE — Telephone Encounter (Signed)
Pts daughter is following up on plan of care moving forward,  she is wanting to know what meds her mom should start or stop taking and whether or not her mom should see Dr. Harrington Challenger for a follow up.

## 2020-09-07 NOTE — Telephone Encounter (Signed)
I called pt to inform her of recommendation for referral to balance rehab: Fay Records, MD  Rodman Key, RN Hematoma is resolved  I would recomm referral to balance / vestibular rehab for objective assessment of mobility          She reports had been having home PT w [facility].  They came for a couple weeks after she went home from [place] in April and then said they would return after her knee surgery.   Pt expressed to me that she did not speak with Dr. Harrington Challenger since a week ago Monday when Dr. Harrington Challenger called her to tell her she was in afib.  She remembers talking with me last Wednesday.  She also reviewed again her fall in the shower, stating she stood to rinse/wash the rest of her body and put her hands on the stool and that is when the stool fell over and she went over w it.    Other than that she does not remember the last time she fell.  That it has been a long time. She reports she is in really good condition other than her knee and she doesn't see Dr. Alvan Dame until July at that time they will discuss her next knee surgery.  She reports the DVT she had post her last knee surgery for which she was taking Eliquis.  Pt is scheduled for follow up with [facility] 6/28.   Reviewed with Dr. Harrington Challenger.  [place] [phone] and left a message for Herbert Deaner to call and leave his number for Dr. Harrington Challenger to call him back.

## 2020-09-07 NOTE — Telephone Encounter (Signed)
SPoke to daughter on phone    REviewed my concerns about patient's fall risk  Told her I spoke to her mom last week  Daughter said pt does not remember talking to me On review, pt told me fall came while she was seated and reaching for something    M. Redmond Pulling said patient described putting hands on chair and it fell over, she went with it.    All concerning for level of awareness of surroundings and fall risk

## 2020-09-07 NOTE — Telephone Encounter (Signed)
Patient is asking that Dr. Harrington Challenger  give her a call personally. She still have question and concerns about what's going on. Please advise

## 2020-09-08 NOTE — Telephone Encounter (Signed)
Spoke to [facility] staff. Advised to send fax with orders, last OV note, and patient demographics. Placed in nurse fax in medical records.

## 2020-09-08 NOTE — Telephone Encounter (Signed)
Please place order to have Herbert Deaner [facility]) to make f/u visit to patient;s home to assess mobility / fall risk I left VM on Daughters phone

## 2020-09-11 ENCOUNTER — Other Ambulatory Visit (HOSPITAL_COMMUNITY): Payer: Self-pay

## 2020-09-20 ENCOUNTER — Telehealth: Payer: PPO

## 2020-09-21 ENCOUNTER — Other Ambulatory Visit: Payer: Self-pay | Admitting: Internal Medicine

## 2020-09-21 ENCOUNTER — Encounter: Payer: Self-pay | Admitting: Family

## 2020-09-21 ENCOUNTER — Other Ambulatory Visit (HOSPITAL_COMMUNITY): Payer: Self-pay

## 2020-09-21 ENCOUNTER — Telehealth: Payer: Self-pay | Admitting: Internal Medicine

## 2020-09-21 ENCOUNTER — Other Ambulatory Visit: Payer: Self-pay

## 2020-09-21 ENCOUNTER — Ambulatory Visit (INDEPENDENT_AMBULATORY_CARE_PROVIDER_SITE_OTHER): Payer: PPO | Admitting: Family

## 2020-09-21 ENCOUNTER — Telehealth: Payer: Self-pay

## 2020-09-21 VITALS — BP 122/82 | HR 75 | Ht 65.5 in | Wt 169.8 lb

## 2020-09-21 DIAGNOSIS — I491 Atrial premature depolarization: Secondary | ICD-10-CM | POA: Diagnosis not present

## 2020-09-21 DIAGNOSIS — Z86718 Personal history of other venous thrombosis and embolism: Secondary | ICD-10-CM

## 2020-09-21 DIAGNOSIS — Z8679 Personal history of other diseases of the circulatory system: Secondary | ICD-10-CM | POA: Diagnosis not present

## 2020-09-21 DIAGNOSIS — I10 Essential (primary) hypertension: Secondary | ICD-10-CM

## 2020-09-21 DIAGNOSIS — I480 Paroxysmal atrial fibrillation: Secondary | ICD-10-CM | POA: Diagnosis not present

## 2020-09-21 DIAGNOSIS — I350 Nonrheumatic aortic (valve) stenosis: Secondary | ICD-10-CM | POA: Diagnosis not present

## 2020-09-21 DIAGNOSIS — R296 Repeated falls: Secondary | ICD-10-CM | POA: Diagnosis not present

## 2020-09-21 DIAGNOSIS — E782 Mixed hyperlipidemia: Secondary | ICD-10-CM

## 2020-09-21 MED ORDER — DIPHENOXYLATE-ATROPINE 2.5-0.025 MG PO TABS: 1.0000 | ORAL_TABLET | Freq: Three times a day (TID) | ORAL | 1 refills | Status: DC | PRN

## 2020-09-21 MED ORDER — DIPHENOXYLATE-ATROPINE 2.5-0.025 MG PO TABS
1.0000 | ORAL_TABLET | Freq: Three times a day (TID) | ORAL | 1 refills | Status: DC | PRN
  Filled 2020-09-21: qty 90, 30d supply, fill #0
  Filled 2020-10-20: qty 90, 30d supply, fill #1

## 2020-09-21 NOTE — Progress Notes (Signed)
Pt should have been seen by P T for assessment of fall risk ? Have they been by to see her?

## 2020-09-21 NOTE — Progress Notes (Signed)
Cardiology Clinic Note   Patient Name: Alexis Olson Date of Encounter: 09/21/2020  Primary Care Provider:  Mosie Lukes, MD Primary Cardiologist:  Dorris Carnes, MD Electrophysiologist:  None   Patient Profile    Alexis Olson is a 87 y.o. female with a hx of paroxysmal atrial fibrillation, grade 1 diastolic dysfunction by echo 10/2019, mild aortic stenosis, mild to moderate tricuspid regurgitation, frequent falls, COVID-19 04/2020 not requiring hospitalization, hypertension, DVT, GERD, anxiety, rheumatic fever, HLD, SAH, arthritis, gastric bypass surgery, anemia. Presents today for follow up after Preventice monitor.   Past Medical History    Past Medical History:  Diagnosis Date   ALLERGIC RHINITIS    ANEMIA, CHRONIC    Malabsorption related to bypass hx, B12 and iron deficiency   ANXIETY    Arthritis    ASTHMA    Bariatric surgery status    Bowel perforation ([place]) 2016   C. difficile diarrhea    Colon perforation ([place]) 08/2014   following polypetomy during colo, surgical repair   COLONIC POLYPS, ADENOMATOUS, HX OF 2010   DIVERTICULOSIS, COLON    DVT (deep venous thrombosis) ([place]) 2018   DVT (deep venous thrombosis) (HCC)    GERD (gastroesophageal reflux disease) 08/23/2015   Graded compression stocking in place    History of blood transfusion    History of migraine    History of rheumatic fever    HSV (herpes simplex virus) infection 08/16/2015   Hyperlipidemia    Hypertension    Denies   Hyponatremia 05/02/2016   Incomplete left bundle branch block (LBBB) 2016   Noted on EKG   INSOMNIA    Lumbosacral disc disease    Chronic pain; History of osteomyelitis 2010 following ESI complication   Obesity    Osteomyelitis (HCC)    Pelvic fracture (HCC)    Pneumonia    Recurrent UTI 08/23/2015   URINARY URGENCY    Ventral hernia without obstruction or gangrene 05/22/2018   VITAMIN B12 DEFICIENCY    Past Surgical History:  Procedure Laterality Date   BACK SURGERY  1994    for ruptured disc   CHOLECYSTECTOMY     COLONOSCOPY     greenfield filter     ileojejunal bypass  1976   for obesity   LAPAROTOMY N/A 09/18/2014   Procedure: EXPLORATORY LAPAROTOMY WITH REPAIR OF CECIAL PERFORATION;  Surgeon: Autumn Messing III, MD;  Location: [facility];  Service: General;  Laterality: N/A;   TONSILLECTOMY     TOTAL KNEE ARTHROPLASTY Right 08/26/2019   Procedure: RIGHT TOTAL KNEE ARTHROPLASTY, LEFT KNEE CORTISONE INJECTION;  Surgeon: Paralee Cancel, MD;  Location: [facility];  Service: Orthopedics;  Laterality: Right;  70 MINS   UPPER GI ENDOSCOPY     VASCULAR SURGERY Right    revascularization right lower extremity    Allergies  Allergies  Allergen Reactions   Ativan [Lorazepam] Anaphylaxis and Other (See Comments)    Deathly allergic; "resp rate dropped dropped to 6"   Other Anaphylaxis    Red peppers--choking, also   Azithromycin Diarrhea   Benadryl [Diphenhydramine Hcl] Other (See Comments)    Paradoxical response   Ciprofloxacin Hcl Other (See Comments)    Causes yeast infection and refuses to take   Codeine Phosphate Nausea Only    Can tolerate in limited amounts   Hydrocodone Itching and Nausea And Vomiting   Hydrocodone Bit-Homatrop Mbr Itching and Nausea And Vomiting   Levaquin [Levofloxacin In D5w] Other (See Comments)    Muscle soreness  Levofloxacin    Meperidine Hcl Other (See Comments)    Reaction ??   Molds & Smuts Other (See Comments)    unsure   Sulfa Antibiotics Other (See Comments)    Joint pain   Tape Other (See Comments)    No Coban wrap (per the patient)   Tramadol    Ultram [Tramadol Hcl] Other (See Comments)    "Can't move joints"    History of Present Illness   Alexis Olson is a 87 y.o. female with a hx of paroxysmal atrial fibrillation, grade 1 diastolic dysfunction by echo 10/2019, mild aortic stenosis, mild to moderate tricuspid regurgitation, frequent falls, COVID-19 04/2020 not requiring hospitalization, hypertension, DVT, GERD,  anxiety, rheumatic fever, HLD, SAH, arthritis, gastric bypass surgery, anemia.   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 Her daughter is now an Therapist, sports in the emergency department.  She underwent right TKA 08/2019. She was hospitalized 10/2019 after accidental fall with CT of head and C-spine without acute findings. Acute metabolic encephalopathy was likely due to polypharmacy. She had LLE acute DVT and was started on Eliquis. Considered provoked DVT in setting of previous surgery. Echo 10/2019 LVEF 60-65%, no RWMA, gr1DD, mild to moderate TR, mild aortic stenosis. Seen in the ED 11/2019 and 12/2019 for cellulitis of the LLE. Venous duplex 04/08/20 with chronic nonocclusive thrombus with septation in right and left common femoral vein and no evidence of acute DVT.  She had COVID19 04/2020 and received monoclonal antibodies. She did not require hospitalization.   Admitted 07/05/20-07/10/20  with initial concern for septic arthritis but final diagnosis inflammatory arthritis. SNF was declined and home health arranged. She was readmitted 07/11/20 - 07/13/20 with headache after sudden fall in the shower. No prodromal symptoms noted. She had small punctuate subarachnoid hemorhage for which neurosurgery did not recommend intervention. Her Eliquis was discontinued. Her telemetry showed frequent PAC's, occasional bradycardia, and one 2-3 second pause. She was discharged with 30 day Preventice monitor mailed to her home. Cardiology was not formally consulted during this admission, but case discussed with Dr. Harrington Challenger via phone.  Her 30 day monitor showed predominantly normal sinus rhythm with average heart rate of 90 bpm. Her minimum heart rate was 69 bpm. No significant pauses were noted. She had atrial fibrillation which was new finding with <1% burden and total length 2h2min. She had frequent PAC's and rare PVC's. Dr. Harrington Challenger called to discuss and as she reported no palpitations and no recurrent falls, she was recommended to start Aspirin and  referred to home health for vestibular training and objective evaluation of falls risk.  Repeat CT head 09/04/20 showed resolution of subarachnoid hemorrhage.   She presents today for initial evaluation in cardiology clinic. We reviewed her cardiac testing in detail. Long discussion regarding atrial fibrillation. Shares with me that her left knee is going to require replacement and she has visit with orthopedics next month. Of note, had DVT after her previous TKA and has known chronic nonocclusive bilateral lower extremity DVT. Reports no shortness of breath. Tells me her dyspnea on exertion is stable at baseline.. Reports no chest pain, pressure, or tightness. No orthopnea, PND. Reports no palpitations. No falls since most recent admission. Denies lightheadedness, dizziness, near syncope, syncope. Tells me her previous fall in the shower she believes her shower chair became imbalanced and caused her to fall.   EKGs/Labs/Other Studies Reviewed:   The following studies were reviewed today:  Preventice 30 day monitor 07/17/20 1. NSR with sinus brady and sinus  tachycardia 2. Frequent PAC's and rare PVC's 3. Atrial fib with a controlled VR and a RVR (2h 47 minutes total) 4. No prolonged pauses 5. Noise artifact  Echo 11/07/19 1. Left ventricular ejection fraction, by estimation, is 60 to 65%. The  left ventricle has normal function. The left ventricle has no regional  wall motion abnormalities. Left ventricular diastolic parameters are  consistent with Grade I diastolic  dysfunction (impaired relaxation).   2. Right ventricular systolic function is normal. The right ventricular  size is normal. There is normal pulmonary artery systolic pressure.   3. The mitral valve is normal in structure. No evidence of mitral valve  regurgitation. No evidence of mitral stenosis.   4. Tricuspid valve regurgitation is mild to moderate.   5. The aortic valve is normal in structure. Aortic valve regurgitation is   not visualized. Mild aortic valve stenosis.   6. The inferior vena cava is normal in size with greater than 50%  respiratory variability, suggesting right atrial pressure of 3 mmHg.   Recent Labs: 08/30/2020: ALT 8; BUN 14; Creatinine, Ser 0.51; Hemoglobin 11.5; Magnesium 1.8; Platelets 215.0; Potassium 4.0; Sodium 138; TSH 1.82  Recent Lipid Panel    Component Value Date/Time   CHOL 136 08/30/2020 1147   TRIG 172.0 (H) 08/30/2020 1147   HDL 44.60 08/30/2020 1147   CHOLHDL 3 08/30/2020 1147   VLDL 34.4 08/30/2020 1147   LDLCALC 57 08/30/2020 1147   LDLDIRECT 76.0 10/04/2018 1402    Home Medications      Current Meds  Medication Sig   acetaminophen (TYLENOL) 500 MG tablet Take 2 tablets (1,000 mg total) by mouth every 8 (eight) hours.   albuterol (PROVENTIL HFA;VENTOLIN HFA) 108 (90 BASE) MCG/ACT inhaler Inhale 1-2 puffs into the lungs every 6 (six) hours as needed for wheezing or shortness of breath.   ALPRAZolam (XANAX) 0.5 MG tablet Take 1/2 tablet (0.25 mg total) by mouth at bedtime as needed for anxiety.   amitriptyline (ELAVIL) 100 MG tablet TAKE 1 TABLET (100 MG TOTAL) BY MOUTH DAILY.   aspirin EC 81 MG tablet Take 81 mg by mouth daily. Swallow whole.   azelastine (OPTIVAR) 0.05 % ophthalmic solution Place 1 drop into both eyes daily as needed (for dry eyes).   benzonatate (TESSALON) 100 MG capsule TAKE 1 CAPSULE BY MOUTH 3 TIMES DAILY AS NEEDED FOR COUGH   budesonide-formoterol (SYMBICORT) 160-4.5 MCG/ACT inhaler Inhale 2 puffs into the lungs See admin instructions. Inhale 2 puffs into the lungs one to two times a day   cetirizine (ZYRTEC) 10 MG tablet Take 10 mg by mouth daily.    cyanocobalamin (,VITAMIN B-12,) 1000 MCG/ML injection INJECT 1CC INTO THE MUSCLE EVERY MONTH   dicyclomine (BENTYL) 10 MG capsule Take 1 capsule (10 mg total) by mouth 3 (three) times daily as needed for diarrhea and symptoms.   Fe Fum-FA-B Cmp-C-Zn-Mg-Mn-Cu (HEMOCYTE PLUS) 106-1 MG CAPS TAKE 1  CAPSULE BY MOUTH 2 TIMES DAILY   fluticasone (FLONASE) 50 MCG/ACT nasal spray Place 2 sprays into the nose daily.   gabapentin (NEURONTIN) 300 MG capsule TAKE 1 CAPSULE BY MOUTH AT BEDTIME   hydrOXYzine (ATARAX) 25 MG tablet Take 25 mg by mouth 3 (three) times daily as needed for itching.   levocetirizine (XYZAL) 5 MG tablet Take 1 tablet by mouth in the evening   magnesium oxide (MAG-OX) 400 MG tablet Take 400 mg by mouth 2 (two) times daily.   montelukast (SINGULAIR) 10 MG tablet Take 10 mg by  mouth at bedtime.   montelukast (SINGULAIR) 10 MG tablet Take 1 tablet by mouth in the evening   omeprazole (PRILOSEC) 20 MG capsule TAKE 1 CAPSULE BY MOUTH ONCE DAILY   ondansetron (ZOFRAN-ODT) 4 MG disintegrating tablet Take 1 tablet (4 mg total) by mouth 3 (three) times daily as needed for nausea or vomiting.   potassium chloride SA (KLOR-CON) 20 MEQ tablet TAKE 1 TABLET BY MOUTH ONCE A DAY   Probiotic Product (PROBIOTIC DAILY PO) Take 1 capsule by mouth daily.   valACYclovir (VALTREX) 500 MG tablet TAKE 1 TABLET BY MOUTH ONCE A DAY AS NEEDED.   Vitamin D, Ergocalciferol, (DRISDOL) 1.25 MG (50000 UNIT) CAPS capsule TAKE 1 CAPSULE BY MOUTH EVERY 7 DAYS   zolpidem (AMBIEN) 5 MG tablet TAKE 1 TABLET BY MOUTH AT BEDTIME   [DISCONTINUED] diphenoxylate-atropine (LOMOTIL) 2.5-0.025 MG tablet Take 1 tablet by mouth 3 (three) times daily as needed for diarrhea or loose stools.     Family History    Family History  Problem Relation Age of Onset   Cervical cancer Mother    Stroke Mother    Liver disease Sister    Kidney disease Sister    Cirrhosis Sister    Colon cancer Neg Hx    Esophageal cancer Neg Hx    Rectal cancer Neg Hx    Stomach cancer Neg Hx    She indicated that her mother is deceased. She indicated that her father is deceased. She indicated that the status of her sister is unknown. She indicated that her brother is alive. She indicated that her daughter is alive. She indicated that her  son is alive. She indicated that the status of her neg hx is unknown.  Social History    Social History   Socioeconomic History   Marital status: Married    Spouse name: Not on file   Number of children: 3   Years of education: Not on file   Highest education level: Not on file  Occupational History   Occupation: retired Programmer, multimedia: RETIRED  Tobacco Use   Smoking status: Never   Smokeless tobacco: Never  Substance and Sexual Activity   Alcohol use: No    Alcohol/week: 0.0 standard drinks   Drug use: No   Sexual activity: Never    Partners: Male  Other Topics Concern   Not on file  Social History Narrative   Lives with spouse, Indep ADLs   Supportive family nearby   No dietary restrictions   Social Determinants of Health   Financial Resource Strain: Not on file  Food Insecurity: No Food Insecurity   Worried About Charity fundraiser in the Last Year: Never true   [place] in the Last Year: Never true  Transportation Needs: No Transportation Needs   Lack of Transportation (Medical): No   Lack of Transportation (Non-Medical): No  Physical Activity: Not on file  Stress: Not on file  Social Connections: Not on file  Intimate Partner Violence: Not on file     Review of Systems    Review of Systems  Constitutional: Negative for chills, fever and malaise/fatigue.  Cardiovascular:  Positive for dyspnea on exertion. Negative for chest pain, irregular heartbeat, leg swelling, near-syncope, orthopnea, palpitations and syncope.  Respiratory:  Negative for cough, shortness of breath and wheezing.   Gastrointestinal:  Positive for diarrhea (chronic). Negative for melena, nausea and vomiting.  Genitourinary:  Negative for hematuria.  Neurological:  Positive for weakness. Negative  for dizziness and light-headedness.    Physical Exam    VS:  BP 122/82   Pulse 75   Ht 5' 5.5" (1.664 m)   Wt 169 lb 12.8 oz (77 kg)   SpO2 98%   BMI 27.83 kg/m  , BMI Body mass  index is 27.83 kg/m. GEN: Well nourished, well developed, in no acute distress. HEENT: Normal. Neck: Supple, no JVD, carotid bruits, or masses. Cardiac: RRR, no murmurs, rubs, or gallops. No clubbing, cyanosis, edema.  Radials/PT 2+ and equal bilaterally.  Respiratory:  Respirations regular and unlabored, clear to auscultation bilaterally. GI: Soft, nontender, nondistended, BS + x 4. MS: No deformity or atrophy. Skin: Warm and dry, no rash. Neuro:  Strength and sensation are intact. Psych: Normal affect.  Assessment & Plan   PAF - Noted by 30 day monitor 06/2020 with <1% burden. Denies palpitations. CHA2DS2-VASc Score = 5 [CHF History: No, HTN History: Yes, Diabetes History: No, Stroke History: No, Vascular Disease History: Yes, Age Score: 2, Gender Score: 1].  Therefore, the patient's annual risk of stroke is 7.2 %.  No anticoagulation due to frequent falls and recent fall with subarachnoid hemorrhage which has since resolved. Plan for PT with vestibular and gait training which has previously been ordered. Readdress anticoagulation after PT evaluation. Continue Aspirin 81mg  daily. As burden was low <1%, defer rate or rhythm control. Likely etiology valvular dysfunction as known mild to moderate TR and mild AS.   PAC - 30 day monitor 06/2020 with 17% burden. Denies palpitations. Frequent falls and reported fatigue raises concern with starting beta blocker. As she is asymptomatic, will defer. Encouraged to avoid caffeine, stay well hydrated. Does not smoke nor drink.   DVT - Chronic nonocclusive DVT. Anticoagulation on hold, as above.   Bradyarrhythmias - noted during admission 06/2020. 30 day monitor 06/2020 with minimum HR 69bpm, no significant pause. Anticipate bradycardia is false reading due to frequent PAC. No indication for PPM.   Frequent falls - Fall 10/2019 and 06/2020. Fall 08/5033 complicated by subarachnoid hemorrhage at which time Eliquis was discontinued. Her subarachnoid hemorrhage has  since resolved by CT 09/03/20. She was referred to [facility] home health for balance/vaestibular rehab and objective assessment of fall risk.   Mild aortic stenosis / Tricuspid regurgitation - Echo 11/07/19 EF 60-65%, gr1DD, mild to moderate TR, mild aortic valve stenosis. No lightheadedness, chest pain. Update echo 10/2020 for monitoring. Continue optimal BP and HR control.   HTN - BP well controlled. Presently managed with diet and exercise.   HLD - Management with diet and lifestyle. 08/2020 LDL 57.   Disposition: Follow up in 2 month(s) with Dr. Harrington Challenger or APP.  Loel Dubonnet, NP  09/21/2020, 9:38 PM

## 2020-09-21 NOTE — Telephone Encounter (Signed)
Patient requesting refill on Lomotil Sir, she has an upcoming appointment. [employer]

## 2020-09-21 NOTE — Patient Instructions (Addendum)
Medication Instructions:  Continue your current medications.   *If you need a refill on your cardiac medications before your next appointment, please call your pharmacy*  Lab Work: None ordered today.   Testing/Procedures: Your monitor showed that you had atrial fibrillation while you wore the monitor for 2 hours and 47 minutes.   Your physician has requested that you have an echocardiogram in August for monitoring of your heart valves. Echocardiography is a painless test that uses sound waves to create images of your heart. It provides your doctor with information about the size and shape of your heart and how well your heart's chambers and valves are working. This procedure takes approximately one hour. There are no restrictions for this procedure.  Follow-Up: At [facility], you and your health needs are our priority.  As part of our continuing mission to provide you with exceptional heart care, we have created designated Provider Care Teams.  These Care Teams include your primary Cardiologist (physician) and Advanced Practice Providers (APPs -  Physician Assistants and Nurse Practitioners) who all work together to provide you with the care you need, when you need it.  We recommend signing up for the patient portal called "MyChart".  Sign up information is provided on this After Visit Summary.  MyChart is used to connect with patients for Virtual Visits (Telemedicine).  Patients are able to view lab/test results, encounter notes, upcoming appointments, etc.  Non-urgent messages can be sent to your provider as well.   To learn more about what you can do with MyChart, go to [email or web address].    Your next appointment:   2 month(s) (after Echocardiogram)  The format for your next appointment:   In Person  Provider:   You may see Dorris Carnes, MD or one of the following Advanced Practice Providers on your designated Care Team:   Richardson Dopp, PA-C Robbie Lis, [place]  Other  Instructions  Atrial Fibrillation  Atrial fibrillation is a type of heartbeat that is irregular or fast. If you have this condition, your heart beats without any order. This makes it hard foryour heart to pump blood in a normal way. Atrial fibrillation may come and go, or it may become a long-lasting problem. If this condition is not treated, it can put you at higher risk for stroke,heart failure, and other heart problems. What are the causes? This condition may be caused by diseases that damage the heart. They include: High blood pressure. Heart failure. Heart valve disease. Heart surgery. Other causes include: Diabetes. Thyroid disease. Being overweight. Kidney disease. Sometimes the cause is not known. What increases the risk? You are more likely to develop this condition if: You are older. You smoke. You exercise often and very hard. You have a family history of this condition. You are a man. You use drugs. You drink a lot of alcohol. You have lung conditions, such as emphysema, pneumonia, or COPD. You have sleep apnea. What are the signs or symptoms? Common symptoms of this condition include: A feeling that your heart is beating very fast. Chest pain or discomfort. Feeling short of breath. Suddenly feeling light-headed or weak. Getting tired easily during activity. Fainting. Sweating. In some cases, there are no symptoms. How is this treated? Treatment for this condition depends on underlying conditions and how you feel when you have atrial fibrillation. They include: Medicines to: Prevent blood clots. Treat heart rate or heart rhythm problems. Using devices, such as a pacemaker, to correct heart rhythm problems. Doing surgery to remove  the part of the heart that sends bad signals. Closing an area where clots can form in the heart (left atrial appendage). In some cases, your doctor will treat other underlying conditions. Follow these instructions at  home: Medicines Take over-the-counter and prescription medicines only as told by your doctor. Do not take any new medicines without first talking to your doctor. If you are taking blood thinners: Talk with your doctor before you take any medicines that have aspirin or NSAIDs, such as ibuprofen, in them. Take your medicine exactly as told by your doctor. Take it at the same time each day. Avoid activities that could hurt or bruise you. Follow instructions about how to prevent falls. Wear a bracelet that says you are taking blood thinners. Or, carry a card that lists what medicines you take. Lifestyle     Do not use any products that have nicotine or tobacco in them. These include cigarettes, e-cigarettes, and chewing tobacco. If you need help quitting, ask your doctor. Eat heart-healthy foods. Talk with your doctor about the right eating plan for you. Exercise regularly as told by your doctor. Do not drink alcohol. Lose weight if you are overweight. Do not use drugs, including cannabis. General instructions If you have a condition that causes breathing to stop for a short period of time (apnea), treat it as told by your doctor. Keep a healthy weight. Do not use diet pills unless your doctor says they are safe for you. Diet pills may make heart problems worse. Keep all follow-up visits as told by your doctor. This is important. Contact a doctor if: You notice a change in the speed, rhythm, or strength of your heartbeat. You are taking a blood-thinning medicine and you get more bruising. You get tired more easily when you move or exercise. You have a sudden change in weight. Get help right away if:  You have pain in your chest or your belly (abdomen). You have trouble breathing. You have side effects of blood thinners, such as blood in your vomit, poop (stool), or pee (urine), or bleeding that cannot stop. You have any signs of a stroke. "BE FAST" is an easy way to remember the main  warning signs: B - Balance. Signs are dizziness, sudden trouble walking, or loss of balance. E - Eyes. Signs are trouble seeing or a change in how you see. F - Face. Signs are sudden weakness or loss of feeling in the face, or the face or eyelid drooping on one side. A - Arms. Signs are weakness or loss of feeling in an arm. This happens suddenly and usually on one side of the body. S - Speech. Signs are sudden trouble speaking, slurred speech, or trouble understanding what people say. T - Time. Time to call emergency services. Write down what time symptoms started. You have other signs of a stroke, such as: A sudden, very bad headache with no known cause. Feeling like you may vomit (nausea). Vomiting. A seizure. These symptoms may be an emergency. Do not wait to see if the symptoms will go away. Get medical help right away. Call your local emergency services (911 in the [place] Do not drive yourself to the hospital. Summary Atrial fibrillation is a type of heartbeat that is irregular or fast. You are at higher risk of this condition if you smoke, are older, have diabetes, or are overweight. Follow your doctor's instructions about medicines, diet, exercise, and follow-up visits. Get help right away if you have signs or symptoms  of a stroke. Get help right away if you cannot catch your breath, or you have chest pain or discomfort. This information is not intended to replace advice given to you by your health care provider. Make sure you discuss any questions you have with your healthcare provider. Document Revised: 09/04/2018 Document Reviewed: 09/04/2018 Elsevier Patient Education  [place].

## 2020-09-21 NOTE — Telephone Encounter (Signed)
Patient made appointment. Lomotil rx faxed to [facility].

## 2020-09-21 NOTE — Telephone Encounter (Signed)
Inbound call from patient requesting medication refill for lomotil to [facility] outpatient. Have an appointment 9/12

## 2020-09-22 ENCOUNTER — Other Ambulatory Visit (HOSPITAL_COMMUNITY): Payer: Self-pay

## 2020-09-25 ENCOUNTER — Other Ambulatory Visit (HOSPITAL_COMMUNITY): Payer: Self-pay

## 2020-09-25 ENCOUNTER — Other Ambulatory Visit: Payer: Self-pay | Admitting: Internal Medicine

## 2020-09-26 ENCOUNTER — Other Ambulatory Visit: Payer: Self-pay | Admitting: Internal Medicine

## 2020-09-28 ENCOUNTER — Other Ambulatory Visit: Payer: Self-pay | Admitting: Family Medicine

## 2020-09-28 ENCOUNTER — Other Ambulatory Visit (HOSPITAL_COMMUNITY): Payer: Self-pay

## 2020-09-28 MED ORDER — OMEPRAZOLE 20 MG PO CPDR
DELAYED_RELEASE_CAPSULE | Freq: Every day | ORAL | 0 refills | Status: DC
  Filled 2020-09-28: qty 90, 90d supply, fill #0

## 2020-09-28 MED ORDER — HEMOCYTE PLUS 106-1 MG PO CAPS
1.0000 | ORAL_CAPSULE | Freq: Two times a day (BID) | ORAL | 1 refills | Status: DC
  Filled 2020-09-28: qty 60, 30d supply, fill #0
  Filled 2020-10-11 – 2020-10-12 (×2): qty 60, 30d supply, fill #1

## 2020-09-29 ENCOUNTER — Telehealth: Payer: Self-pay | Admitting: *Deleted

## 2020-09-29 ENCOUNTER — Other Ambulatory Visit (HOSPITAL_COMMUNITY): Payer: Self-pay

## 2020-09-29 MED ORDER — AMITRIPTYLINE HCL 100 MG PO TABS
100.0000 mg | ORAL_TABLET | Freq: Every day | ORAL | 1 refills | Status: DC
  Filled 2020-09-29: qty 90, 90d supply, fill #0
  Filled 2020-12-29: qty 90, 90d supply, fill #1

## 2020-09-29 MED ORDER — POTASSIUM CHLORIDE CRYS ER 20 MEQ PO TBCR
20.0000 meq | EXTENDED_RELEASE_TABLET | Freq: Every day | ORAL | 1 refills | Status: DC
  Filled 2020-09-29: qty 90, 90d supply, fill #0
  Filled 2021-01-04: qty 90, 90d supply, fill #1

## 2020-09-29 NOTE — Chronic Care Management (AMB) (Signed)
  Care Management   Note  09/29/2020 Name: Alexis Olson MRN: [id number] DOB: 11-26-1933  Alexis Olson is a 87 y.o. year old female who is a primary care patient of Mosie Lukes, MD and is actively engaged with the care management team. I reached out to [name] by phone today to assist with re-scheduling a follow up visit with the RN Case Manager  Follow up plan: Patient declines further follow up and engagement by the care management team. Appropriate care team members and provider have been notified via electronic communication. The care management team is available to follow up with the patient after provider conversation with the patient regarding recommendation for care management engagement and subsequent re-referral to the care management team.   [place] Management

## 2020-09-30 ENCOUNTER — Other Ambulatory Visit (HOSPITAL_COMMUNITY): Payer: Self-pay

## 2020-10-01 ENCOUNTER — Ambulatory Visit: Payer: Self-pay

## 2020-10-01 ENCOUNTER — Other Ambulatory Visit (HOSPITAL_COMMUNITY): Payer: Self-pay

## 2020-10-01 DIAGNOSIS — E785 Hyperlipidemia, unspecified: Secondary | ICD-10-CM

## 2020-10-01 DIAGNOSIS — M25562 Pain in left knee: Secondary | ICD-10-CM | POA: Diagnosis not present

## 2020-10-01 DIAGNOSIS — I4891 Unspecified atrial fibrillation: Secondary | ICD-10-CM

## 2020-10-01 DIAGNOSIS — Z96651 Presence of right artificial knee joint: Secondary | ICD-10-CM | POA: Diagnosis not present

## 2020-10-01 DIAGNOSIS — M1712 Unilateral primary osteoarthritis, left knee: Secondary | ICD-10-CM | POA: Diagnosis not present

## 2020-10-01 NOTE — Patient Instructions (Signed)
Visit Information  Thank you for allowing me to share the care management and care coordination services that are available to you as part of your health plan and services through your primary care provider and medical home. Please reach out to your primary care provider if the care management/care coordination team may be of assistance to you in the future.   Thea Silversmith, RN, MSN, BSN, CCM Care Management Coordinator [facility] [phone]

## 2020-10-01 NOTE — Chronic Care Management (AMB) (Signed)
   Embedded Care Coordination  Case Closure Note  10/01/2020 Name: Alexis Olson MRN: [id number] DOB: 08-06-1933  Alexis Olson is a 87 y.o. year old female who is a primary care patient of Mosie Lukes, MD. The Embedded Care Coordination team was consulted for assistance with chronic disease management and care coordination needs related to Atrial Fibrillation and HLD  The patient declines further follow up and engagement by the care management team and the case will be closed. Appropriate care team members and provider have been notified via electronic communication. The care management team is available to provide care management/care coordination support at any time in the future should needs arise. Further engagement requires referral order ([id number]).   If patient returns call to provider office and is in need of assistance from the embedded care coordination team, please advise that the patient call the [facility] at [phone] for assistance.   Care Plan : Atrial Fibrillation (Adult)  Updates made by Luretha Rued, RN since 10/01/2020 12:00 AM  Completed 10/01/2020   Problem: recent Dysrhythmia (work up for dysrhymia) in a patient with HLD Resolved 10/01/2020  Priority: High     Goal: Heart Rate and Rhythm Monitored and Managed Completed 10/01/2020  Start Date: 09/03/2020  Expected End Date: 03/05/2021  Priority: High  Note:   Objective:  Last practice recorded BP readings:  BP Readings from Last 3 Encounters:  08/30/20 112/64  07/20/20 (!) 121/54  07/13/20 120/62  Current Barriers:  Knowledge deficit related to long term management of dysrhythmia-evaluating for atrial fibrillation in a patient with Hyperlipidemia, chronic anemia, history of DVT. Client reports a recent diagnosis of atrial fibrillation on ASA 81 mg. Client denies having a history of hypertension.Client reports a fall sustained on Easter that resulted in subarachnoid hemorrhage. Client express that she is  getting ready for a CT scan today to follow up on previous bleed. Primary concern is learning more about atrial fibrillation and plan for management.  Case Manager Clinical Goal(s):  patient will verbalize understanding of plan for atrial fibrillation management patient will attend all scheduled medical appointments patient will verbalize basic understanding of atrial fibrillation disease process and self health management plan Interventions:  Collaboration with Mosie Lukes, MD regarding development and update of comprehensive plan of care as evidenced by provider attestation and co-signature Inter-disciplinary care team collaboration (see longitudinal plan of care) Discussed ongoing CM services with the patient who does not wish to continue to receive services. Self-Care Activities: Self administers medications as prescribed Attends scheduled provider appointments Calls pharmacy for medication refills Performs ADL's independently Performs IADL's independently Calls provider office for new concerns, questions Patient Goals: - develop a rescue plan and know when to call the doctor - notify your doctor if you have a racing or irregular heart beat that may be uncomfortable, Shortness of breath with or without chest pain, weakness(unusually tired or feeling faint) or dizziness. - eat healthy: whole grains, fruits and vegetables, lean meats and healthy fats and monitor salt intake - track symptoms and what helps feel better or worse - Please review education material on fall prevention, atrial fibrillation and high triglycerides. Plan to discuss at our next telephone call Follow Up Plan: No additional follow up needed.       Thea Silversmith, RN, MSN, BSN, CCM Care Management Coordinator [facility] [phone]

## 2020-10-11 ENCOUNTER — Other Ambulatory Visit (HOSPITAL_COMMUNITY): Payer: Self-pay

## 2020-10-12 ENCOUNTER — Other Ambulatory Visit (HOSPITAL_COMMUNITY): Payer: Self-pay

## 2020-10-14 ENCOUNTER — Other Ambulatory Visit (HOSPITAL_COMMUNITY): Payer: Self-pay

## 2020-10-14 ENCOUNTER — Telehealth: Payer: Self-pay | Admitting: Family Medicine

## 2020-10-14 MED ORDER — VITAMIN D (ERGOCALCIFEROL) 1.25 MG (50000 UNIT) PO CAPS
ORAL_CAPSULE | ORAL | 0 refills | Status: DC
  Filled 2020-10-14: qty 12, fill #0
  Filled 2020-11-09: qty 12, 84d supply, fill #0

## 2020-10-14 NOTE — Telephone Encounter (Signed)
Rx resent for 12 tablets.

## 2020-10-14 NOTE — Telephone Encounter (Signed)
Patient states she received her Vitamin D however it was only 4 pills. She is not understanding why only 4 it was supposed to be 12. Patient states is the same price she would like prescription to be change back to 12

## 2020-10-15 ENCOUNTER — Other Ambulatory Visit (HOSPITAL_COMMUNITY): Payer: Self-pay

## 2020-10-18 ENCOUNTER — Other Ambulatory Visit: Payer: Self-pay | Admitting: Internal Medicine

## 2020-10-18 ENCOUNTER — Other Ambulatory Visit (HOSPITAL_COMMUNITY): Payer: Self-pay

## 2020-10-19 ENCOUNTER — Other Ambulatory Visit (HOSPITAL_COMMUNITY): Payer: Self-pay

## 2020-10-19 MED ORDER — CYANOCOBALAMIN 1000 MCG/ML IJ SOLN
INTRAMUSCULAR | 1 refills | Status: DC
  Filled 2020-10-19: qty 3, 90d supply, fill #0
  Filled 2021-03-24: qty 3, 90d supply, fill #1
  Filled 2021-08-08: qty 3, 90d supply, fill #2

## 2020-10-20 ENCOUNTER — Other Ambulatory Visit: Payer: Self-pay | Admitting: Family Medicine

## 2020-10-20 ENCOUNTER — Other Ambulatory Visit (HOSPITAL_COMMUNITY): Payer: Self-pay

## 2020-10-20 MED ORDER — ZOLPIDEM TARTRATE 5 MG PO TABS
ORAL_TABLET | Freq: Every day | ORAL | 1 refills | Status: DC
  Filled 2020-10-20: qty 90, 90d supply, fill #0
  Filled 2021-01-15: qty 90, 90d supply, fill #1

## 2020-10-20 NOTE — Telephone Encounter (Signed)
Requesting:ambien 5 mg Contract:08/30/20 UDS:11/27/19 Last Visit:08/30/20 Next Visit:11/11/20 Last Refill:07/20/20  Please Advise

## 2020-10-20 NOTE — Telephone Encounter (Signed)
Pt called requesting rf for Lomotil be for 130 pills. She stated that the amount that was sent is not enough as she goes through them fast.

## 2020-10-20 NOTE — Telephone Encounter (Signed)
I spoke with Alexis Olson and she said she takes 2 lomotil at a time and she takes anywhere from 2-6 a day. This is why she is requesting more than #90. She would like #130. She has an appointment for 12/06/20 to see [place].

## 2020-10-21 ENCOUNTER — Other Ambulatory Visit (HOSPITAL_COMMUNITY): Payer: Self-pay

## 2020-10-21 MED ORDER — DIPHENOXYLATE-ATROPINE 2.5-0.025 MG PO TABS
1.0000 | ORAL_TABLET | Freq: Three times a day (TID) | ORAL | 1 refills | Status: DC | PRN
  Filled 2020-10-21 – 2020-11-15 (×3): qty 270, 90d supply, fill #0
  Filled 2021-01-17: qty 240, 80d supply, fill #1
  Filled 2021-01-17: qty 30, 10d supply, fill #1

## 2020-10-21 NOTE — Addendum Note (Signed)
Addended by: Silvano Rusk E on: 10/21/2020 12:45 PM   Modules accepted: Orders

## 2020-10-21 NOTE — Telephone Encounter (Signed)
I sent in 270 with 1 refill

## 2020-10-21 NOTE — Telephone Encounter (Signed)
[place] informed and said thank you.

## 2020-10-22 ENCOUNTER — Telehealth: Payer: Self-pay

## 2020-10-24 ENCOUNTER — Other Ambulatory Visit: Payer: Self-pay | Admitting: Family Medicine

## 2020-10-25 ENCOUNTER — Other Ambulatory Visit (HOSPITAL_COMMUNITY): Payer: Self-pay

## 2020-10-25 MED ORDER — ALPRAZOLAM 0.5 MG PO TABS
0.2500 mg | ORAL_TABLET | Freq: Every evening | ORAL | 1 refills | Status: DC | PRN
  Filled 2020-10-25: qty 15, 30d supply, fill #0

## 2020-10-25 NOTE — Telephone Encounter (Signed)
Requesting: alprazolam Contract: 08/30/20 UDS: 11/27/19  Last Visit: 08/30/20 Next Visit: 11/11/20 Last Refill: 08/30/20  Please Advise

## 2020-10-26 ENCOUNTER — Other Ambulatory Visit (HOSPITAL_COMMUNITY): Payer: Self-pay

## 2020-11-01 ENCOUNTER — Telehealth: Payer: Self-pay

## 2020-11-01 NOTE — Telephone Encounter (Signed)
Pt called stating she has had UTI symptoms for 4 days now.  Today, when she wiped, she had what appeared to be mucous on the toilet paper.  She is requesting an antibiotic to be sent into [facility].

## 2020-11-02 ENCOUNTER — Other Ambulatory Visit: Payer: Self-pay | Admitting: Family Medicine

## 2020-11-02 ENCOUNTER — Telehealth: Payer: Self-pay | Admitting: Internal Medicine

## 2020-11-02 DIAGNOSIS — R350 Frequency of micturition: Secondary | ICD-10-CM

## 2020-11-02 MED ORDER — AMOXICILLIN 500 MG PO CAPS
500.0000 mg | ORAL_CAPSULE | Freq: Three times a day (TID) | ORAL | 0 refills | Status: DC
  Filled 2020-11-02: qty 15, 5d supply, fill #0

## 2020-11-02 NOTE — Telephone Encounter (Signed)
Pt c/o medication issue:  1. Name of Medication: Baby Asprin   2. How are you currently taking this medication (dosage and times per day)? One a day   3. Are you having a reaction (difficulty breathing--STAT)? no  4. What is your medication issue? pt would like to know if she should be taking more than the baby asprin for her afib... please advise

## 2020-11-02 NOTE — Telephone Encounter (Signed)
The visiting nurse was supposed to go by and assess her fall risk.  Can we get a report   He had some hesitancy to this

## 2020-11-02 NOTE — Telephone Encounter (Signed)
Pt calling to discuss her anticoagulant. She was taken off her anticoagulant after her [facility] in April. She is wondering if and when she should go back on it. Per pt, neurosurgery said her [facility] has resolved. Right now she continues to take her '81mg'$  ASA. She does not want to have a stroke and is worried about this.  We discussed her monitor results with <1% AF burden. She has not noticed any increased episodes of AF as well. We discussed risk vs benefit of anticoagulant.   I will forward to Dr. Harrington Challenger and her RN to review and follow up. I believe pt is interested in starting her anticoagulant again.

## 2020-11-03 ENCOUNTER — Other Ambulatory Visit (INDEPENDENT_AMBULATORY_CARE_PROVIDER_SITE_OTHER): Payer: PPO

## 2020-11-03 ENCOUNTER — Other Ambulatory Visit (HOSPITAL_COMMUNITY): Payer: Self-pay

## 2020-11-03 DIAGNOSIS — R350 Frequency of micturition: Secondary | ICD-10-CM

## 2020-11-03 LAB — URINALYSIS, ROUTINE W REFLEX MICROSCOPIC
Bilirubin Urine: NEGATIVE
Ketones, ur: NEGATIVE
Nitrite: NEGATIVE
Specific Gravity, Urine: 1.01 (ref 1.000–1.030)
Urine Glucose: NEGATIVE
Urobilinogen, UA: 0.2 (ref 0.0–1.0)
pH: 7.5 (ref 5.0–8.0)

## 2020-11-04 NOTE — Telephone Encounter (Addendum)
[facility].  They did not have the most recent referral order/paperwork so there has been no recent visit to the patient to assess fall risk/mobility.  She was last seen by OT/PT in May 2022.  I have faxed a new referral, last ov and demographics to [phone] at this time. If apporved, they will see her within 48 hours. ______________________________________  After faxing referral I receive a phone call from the patient as follow to her discussion with the triage nurse on 11/02/20.  Per the patient, it has been recommended to her that she not take any blood thinners for 6 months after having that bleeding event.  Since she needs to have a knee replacement and is getting ready to schedule it, she wants to know should she wait 6 months out since she will require anticoagulation post op.  She is aware I am forwarding to Dr. Harrington Challenger to review and further recommendations.

## 2020-11-05 ENCOUNTER — Telehealth: Payer: Self-pay | Admitting: Family Medicine

## 2020-11-05 ENCOUNTER — Other Ambulatory Visit (HOSPITAL_COMMUNITY): Payer: Self-pay

## 2020-11-05 ENCOUNTER — Other Ambulatory Visit: Payer: Self-pay | Admitting: Family Medicine

## 2020-11-05 LAB — URINE CULTURE
MICRO NUMBER:: 12225422
SPECIMEN QUALITY:: ADEQUATE

## 2020-11-05 MED ORDER — PHENAZOPYRIDINE HCL 100 MG PO TABS
100.0000 mg | ORAL_TABLET | Freq: Three times a day (TID) | ORAL | 0 refills | Status: DC | PRN
  Filled 2020-11-05: qty 20, 7d supply, fill #0

## 2020-11-05 MED ORDER — CEPHALEXIN 500 MG PO CAPS
500.0000 mg | ORAL_CAPSULE | Freq: Three times a day (TID) | ORAL | 0 refills | Status: DC
  Filled 2020-11-05: qty 21, 7d supply, fill #0

## 2020-11-05 NOTE — Telephone Encounter (Signed)
Pt states the medicine amoxicillin (AMOXIL) 500 MG capsule is giving her diarrhea. She would like the other medicine that was prescribed to her before for an UTI. She believes it's called Sepilaxis. She also wants a medicine called Peradium, she states that was prescribed to her as well in order to help with the urination pain. She wants it sent to  [facility] [place], [place] [address]  Phone:  [phone]  Fax:  [phone]

## 2020-11-06 ENCOUNTER — Other Ambulatory Visit (HOSPITAL_COMMUNITY): Payer: Self-pay

## 2020-11-08 IMAGING — MR MR [PERSON_NAME]*[PERSON_NAME]* WO/W CM
4 of 8 series · 18 of 40 positions shown · IV contrast (gadavist)
Comparison: None.

CLINICAL DATA: Soft tissue hand infection

EXAM:
MRI OF THE RIGHT HAND WITHOUT AND WITH CONTRAST
TECHNIQUE: Multiplanar, multisequence MR imaging of the right hand was
performed before and after the administration of intravenous
contrast.
CONTRAST:  8mL GADAVIST GADOBUTROL 1 MMOL/ML IV SOLN

[Series 4: T1 · oblique · 4.0mm · 0.23mm/px · 3 of 43 slices shown]
[im 8/43]
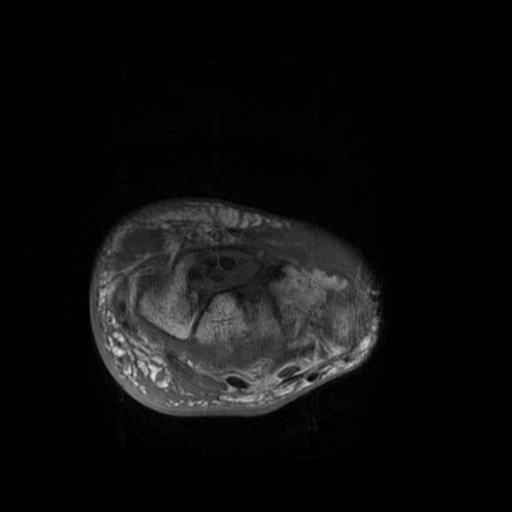
[im 22/43]
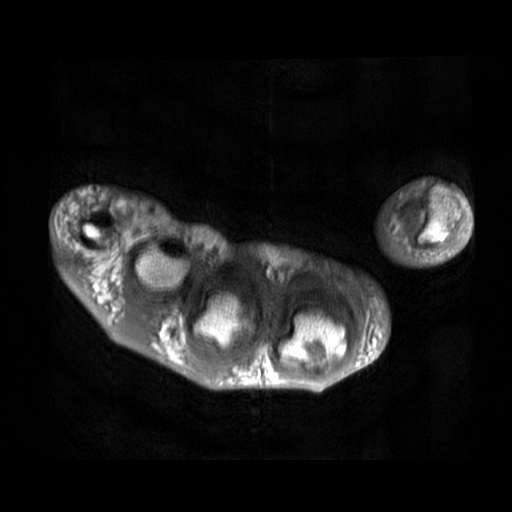
[im 36/43]
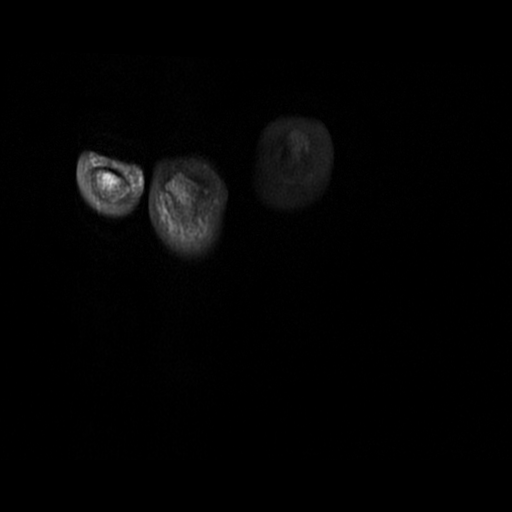

[Series 5: T2 fat-sat · oblique · 4.0mm · 0.25mm/px · 6 of 43 slices shown (1 of 2)]
[im 1/43]
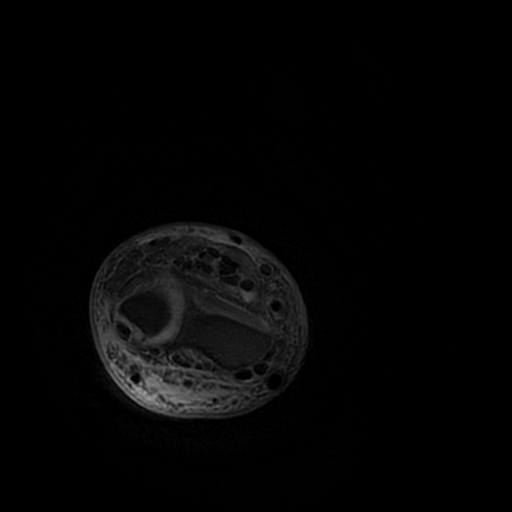
[im 9/43]
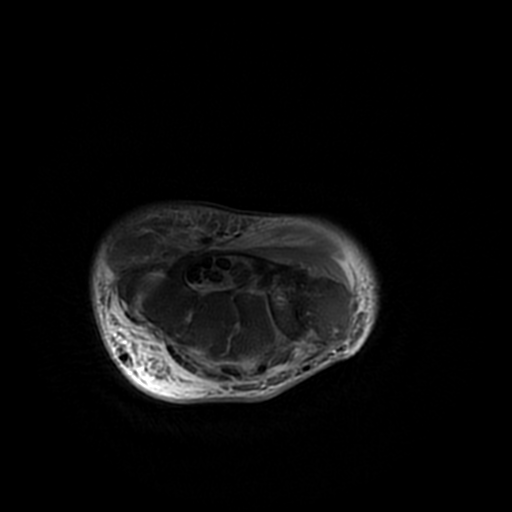
[im 17/43]
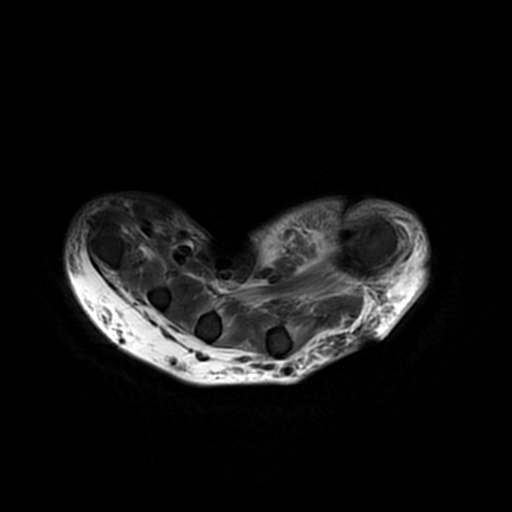
[im 26/43]
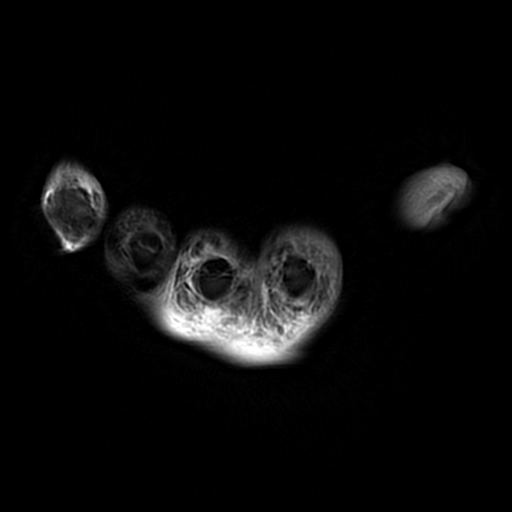
[im 34/43]
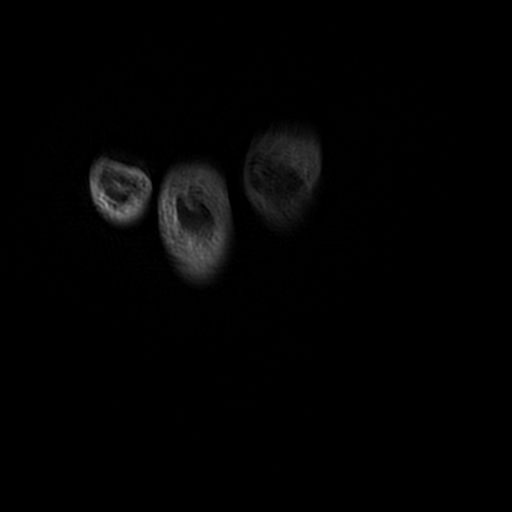
[im 43/43]
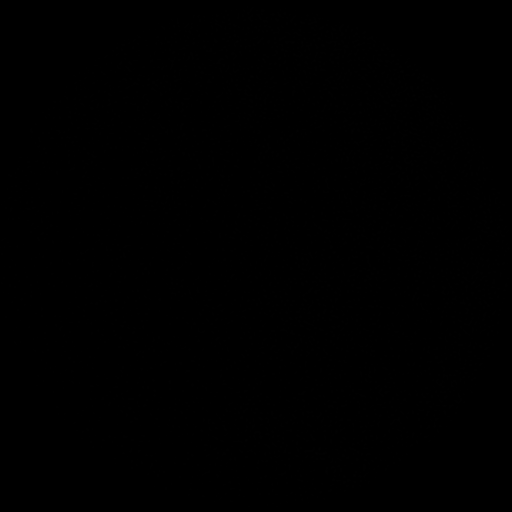

[Series 7: T2 fat-sat · coronal · 3.0mm · 0.35mm/px · 3 of 24 slices shown (2 of 2)]
[im 1/24]
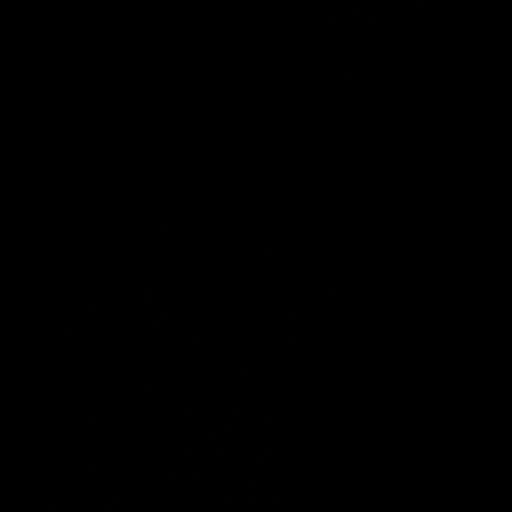
[im 12/24]
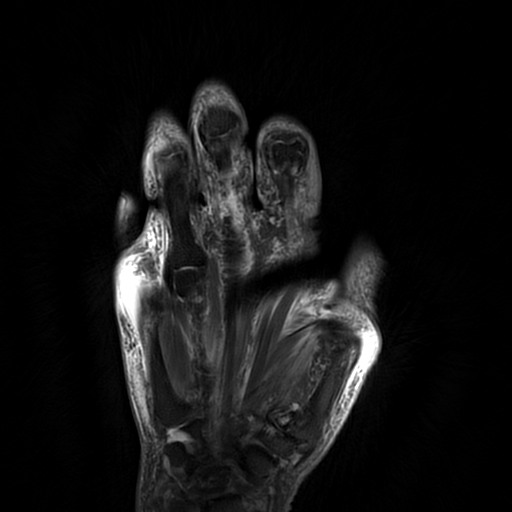
[im 24/24]
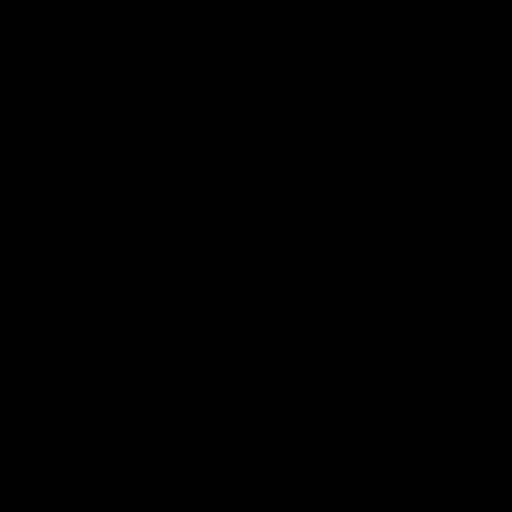

[Series 8: PD fat-sat · oblique · 2.5mm · 0.35mm/px · 6 of 40 slices shown]
[im 1/40]
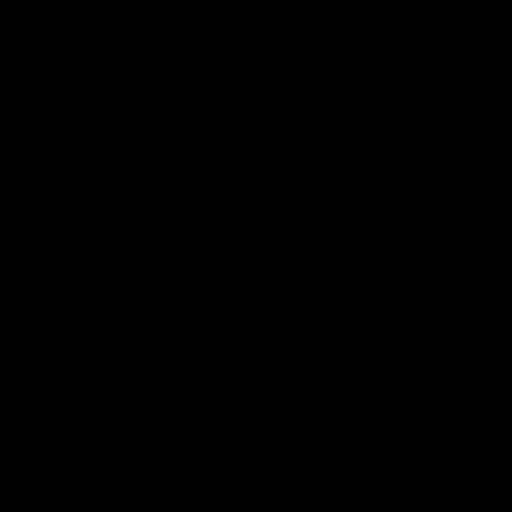
[im 8/40]
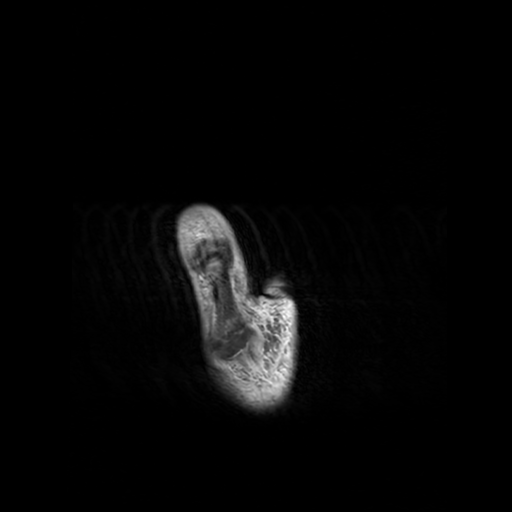
[im 16/40]
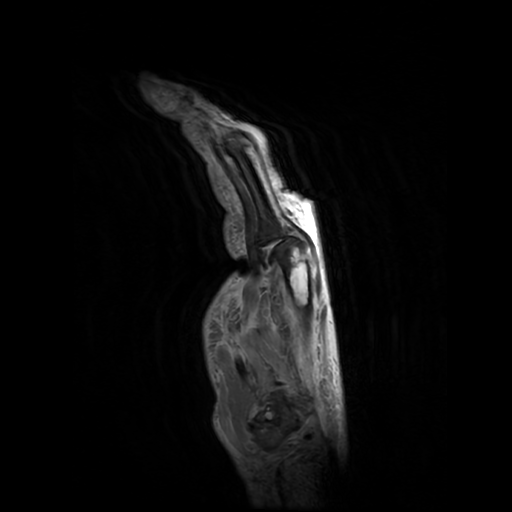
[im 24/40]
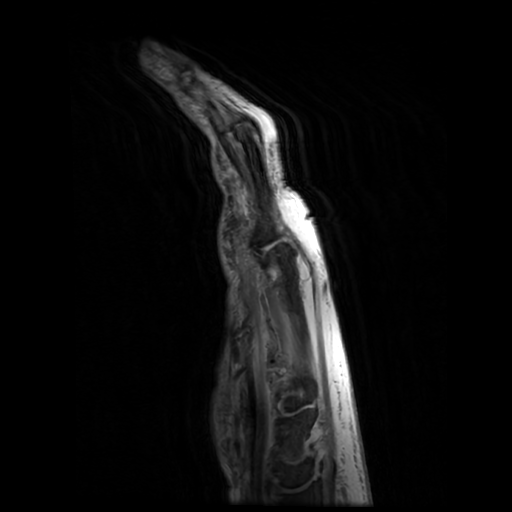
[im 32/40]
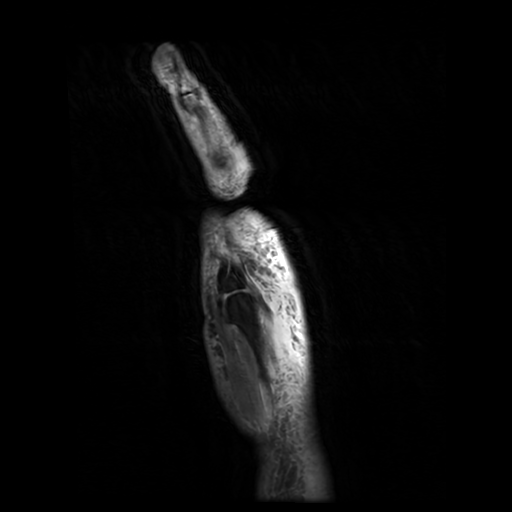
[im 40/40]
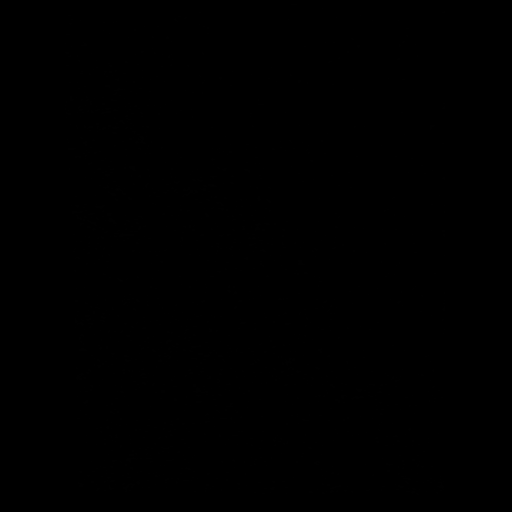

[18 of 40 positions shown; findings below may reference images not displayed]

FINDINGS: Bones/Joint/Cartilage

No areas of cortical destruction or periosteal reaction are seen.
There is a a lobular heterogeneously enhancing T2 bright lesion
within the fourth metacarpal measuring 2 cm with internal areas of
stippled calcification and signal dropout, likely enchondroma. There
is advanced first CMC joint osteoarthritis with joint space loss and
subchondral cystic changes. There is small cystic changes in the
carpals. There is slight flexion deformity seen throughout the D IP
joints with joint space loss. There is also osteoarthritis in the
first MCP joint with marginal osteophyte formation and subchondral
cystic changes.

Ligaments

The collateral ligaments appear to be intact.

Muscles and Tendons

Increased feathery signal and mild enhancement seen throughout the
muscles of the hand. No loculated fluid collection. There is mildly
increased signal seen within the third and fourth flexor digitorum
tendons with a small amount of enhancing fluid. The extensor tendons
are intact.

Soft tissues

There is diffuse subcutaneous edema and skin thickening seen
surrounding the hand, predominantly on the dorsum. No loculated
fluid collections or areas of ulceration are seen.
IMPRESSION: Diffuse dorsal subcutaneous edema and increased signal throughout
the muscles which could be due to myositis and cellulitis. No
loculated fluid collections or evidence of osteomyelitis.

Osteoarthritis as described above.

Slight flexion deformities and probable heberden's nodules at the D
IP joints.

Mild third and fourth digit flexor digitorum tenosynovitis.

2 cm cystic lesion within the second metacarpal, likely enchondroma.

## 2020-11-08 NOTE — Telephone Encounter (Signed)
Patient notified rx was sent.  Patient already got it and she feels better.

## 2020-11-10 ENCOUNTER — Other Ambulatory Visit (HOSPITAL_COMMUNITY): Payer: Self-pay

## 2020-11-10 NOTE — Telephone Encounter (Signed)
Herbert Deaner, physical therapist w [facility] called back with input: not steady, 52 beats while resting, will not be picking up patient; home bound. Please call back at [phone]

## 2020-11-10 NOTE — Telephone Encounter (Signed)
I spoke with Alexis Olson, physical therapist w [facility] and reviewed the reason for referral is that the patient had a small percentage of afib on her heart monitor several months ago and wishes to be put on a blood thinner and Dr. Harrington Challenger is concerned to do that because of a fall/near fall she had in April where she had a subarachnoid hemorrhage.  She is requesting the PT input re: her mobility, safety.

## 2020-11-11 ENCOUNTER — Ambulatory Visit (INDEPENDENT_AMBULATORY_CARE_PROVIDER_SITE_OTHER): Payer: PPO | Admitting: Family Medicine

## 2020-11-11 ENCOUNTER — Other Ambulatory Visit: Payer: Self-pay

## 2020-11-11 ENCOUNTER — Other Ambulatory Visit (HOSPITAL_COMMUNITY): Payer: Self-pay

## 2020-11-11 VITALS — BP 112/70 | HR 60 | Temp 97.9°F | Resp 16 | Wt 168.5 lb

## 2020-11-11 DIAGNOSIS — K529 Noninfective gastroenteritis and colitis, unspecified: Secondary | ICD-10-CM | POA: Diagnosis not present

## 2020-11-11 DIAGNOSIS — R197 Diarrhea, unspecified: Secondary | ICD-10-CM | POA: Diagnosis not present

## 2020-11-11 DIAGNOSIS — N390 Urinary tract infection, site not specified: Secondary | ICD-10-CM | POA: Diagnosis not present

## 2020-11-11 DIAGNOSIS — I4891 Unspecified atrial fibrillation: Secondary | ICD-10-CM

## 2020-11-11 DIAGNOSIS — Z79899 Other long term (current) drug therapy: Secondary | ICD-10-CM | POA: Diagnosis not present

## 2020-11-11 DIAGNOSIS — F411 Generalized anxiety disorder: Secondary | ICD-10-CM | POA: Diagnosis not present

## 2020-11-11 DIAGNOSIS — M5137 Other intervertebral disc degeneration, lumbosacral region: Secondary | ICD-10-CM | POA: Diagnosis not present

## 2020-11-11 DIAGNOSIS — I10 Essential (primary) hypertension: Secondary | ICD-10-CM | POA: Diagnosis not present

## 2020-11-11 DIAGNOSIS — M51379 Other intervertebral disc degeneration, lumbosacral region without mention of lumbar back pain or lower extremity pain: Secondary | ICD-10-CM

## 2020-11-11 MED ORDER — ALPRAZOLAM 0.5 MG PO TABS
0.5000 mg | ORAL_TABLET | Freq: Every evening | ORAL | 3 refills | Status: DC | PRN
  Filled 2020-11-11 – 2020-11-12 (×2): qty 30, 30d supply, fill #0
  Filled 2020-12-17: qty 30, 30d supply, fill #1
  Filled 2021-01-15: qty 30, 30d supply, fill #2
  Filled 2021-02-22: qty 30, 30d supply, fill #3

## 2020-11-11 NOTE — Patient Instructions (Signed)
Urinary Tract Infection, Adult A urinary tract infection (UTI) is an infection of any part of the urinary tract. The urinary tract includes the kidneys, ureters, bladder, and urethra. These organs make, store, and get rid of urine in the body. An upper UTI affects the ureters and kidneys. A lower UTI affects the bladder and urethra. What are the causes? Most urinary tract infections are caused by bacteria in your genital area around your urethra, where urine leaves your body. These bacteria grow and cause inflammation of your urinary tract. What increases the risk? You are more likely to develop this condition if: You have a urinary catheter that stays in place. You are not able to control when you urinate or have a bowel movement (incontinence). You are female and you: Use a spermicide or diaphragm for birth control. Have low estrogen levels. Are pregnant. You have certain genes that increase your risk. You are sexually active. You take antibiotic medicines. You have a condition that causes your flow of urine to slow down, such as: An enlarged prostate, if you are female. Blockage in your urethra. A kidney stone. A nerve condition that affects your bladder control (neurogenic bladder). Not getting enough to drink, or not urinating often. You have certain medical conditions, such as: Diabetes. A weak disease-fighting system (immunesystem). Sickle cell disease. Gout. Spinal cord injury. What are the signs or symptoms? Symptoms of this condition include: Needing to urinate right away (urgency). Frequent urination. This may include small amounts of urine each time you urinate. Pain or burning with urination. Blood in the urine. Urine that smells bad or unusual. Trouble urinating. Cloudy urine. Vaginal discharge, if you are female. Pain in the abdomen or the lower back. You may also have: Vomiting or a decreased appetite. Confusion. Irritability or tiredness. A fever or  chills. Diarrhea. The first symptom in older adults may be confusion. In some cases, they may not have any symptoms until the infection has worsened. How is this diagnosed? This condition is diagnosed based on your medical history and a physical exam. You may also have other tests, including: Urine tests. Blood tests. Tests for STIs (sexually transmitted infections). If you have had more than one UTI, a cystoscopy or imaging studies may be done to determine the cause of the infections. How is this treated? Treatment for this condition includes: Antibiotic medicine. Over-the-counter medicines to treat discomfort. Drinking enough water to stay hydrated. If you have frequent infections or have other conditions such as a kidney stone, you may need to see a health care provider who specializes in the urinary tract (urologist). In rare cases, urinary tract infections can cause sepsis. Sepsis is a life-threatening condition that occurs when the body responds to an infection. Sepsis is treated in the hospital with IV antibiotics, fluids, and other medicines. Follow these instructions at home: Medicines Take over-the-counter and prescription medicines only as told by your health care provider. If you were prescribed an antibiotic medicine, take it as told by your health care provider. Do not stop using the antibiotic even if you start to feel better. General instructions Make sure you: Empty your bladder often and completely. Do not hold urine for long periods of time. Empty your bladder after sex. Wipe from front to back after urinating or having a bowel movement if you are female. Use each tissue only one time when you wipe. Drink enough fluid to keep your urine pale yellow. Keep all follow-up visits. This is important. Contact a health care provider   if: Your symptoms do not get better after 1-2 days. Your symptoms go away and then return. Get help right away if: You have severe pain in your  back or your lower abdomen. You have a fever or chills. You have nausea or vomiting. Summary A urinary tract infection (UTI) is an infection of any part of the urinary tract, which includes the kidneys, ureters, bladder, and urethra. Most urinary tract infections are caused by bacteria in your genital area. Treatment for this condition often includes antibiotic medicines. If you were prescribed an antibiotic medicine, take it as told by your health care provider. Do not stop using the antibiotic even if you start to feel better. Keep all follow-up visits. This is important. This information is not intended to replace advice given to you by your health care provider. Make sure you discuss any questions you have with your health care provider. Document Revised: 10/24/2019 Document Reviewed: 10/24/2019 Elsevier Patient Education  2022 [employer]  

## 2020-11-11 NOTE — Progress Notes (Signed)
Patient ID: Alexis Olson, female    DOB: Mar 12, 1934  Age: 87 y.o. MRN: [id number]    Subjective:  Subjective  HPI Alexis Olson presents for office visit today for follow up on UTI and AFIB. She reports that she is still experiencing diarrhea 4-5 times a day on average. She states that today it has already happened twice. She describes the stool as dark in color, but denies the presence of any blood. She states that the diarrhea is worse when taking antibiotics. She was scheduled for knee surgery, but it got delayed for 6 months due to recent brain injury and needing to wait for it to heal. She is now scheduled for surgery in October. Denies CP/palp/SOB/HA/congestion/fevers/GI or GU c/o. Taking meds as prescribed.  She expresses interest in increasing her Alprazolam dosage since her current one is not as effective. She currently takes 1/2 tablet (0.25 mg) by bedtime.   Review of Systems  Constitutional:  Negative for chills, fatigue and fever.  HENT:  Negative for congestion, rhinorrhea, sinus pressure, sinus pain, sore throat and trouble swallowing.   Eyes:  Negative for pain.  Respiratory:  Negative for cough and shortness of breath.   Cardiovascular:  Negative for chest pain, palpitations and leg swelling.  Gastrointestinal:  Positive for diarrhea. Negative for abdominal pain, blood in stool, nausea and vomiting.  Genitourinary:  Negative for decreased urine volume, flank pain, frequency, vaginal bleeding and vaginal discharge.  Musculoskeletal:  Negative for back pain.  Neurological:  Negative for headaches.   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 difficile diarrhea    Colon perforation ([facility]) 08/2014   following polypetomy during colo, surgical repair   COLONIC POLYPS,  ADENOMATOUS, HX OF 2010   DIVERTICULOSIS, COLON    DVT (deep venous thrombosis) ([facility]) 2018   DVT (deep venous thrombosis) ([facility])    GERD (gastroesophageal reflux disease) 08/23/2015   Graded compression stocking in place    History of blood transfusion    History of migraine    History of rheumatic fever    HSV (herpes simplex virus) infection 08/16/2015   Hyperlipidemia    Hypertension    Denies   Hyponatremia 05/02/2016   Incomplete left bundle branch block (LBBB) 2016   Noted on EKG   INSOMNIA    Lumbosacral disc disease    Chronic pain; History of osteomyelitis 2010 following ESI complication   Obesity    Osteomyelitis (H[facility])    Pelvic fracture (H[facility])    Pneumonia    Recurrent UTI 08/23/2015   URINARY URGENCY    Ventral hernia without obstruction or gangrene 05/22/2018   VITAMIN B12 DEFICIENCY     She has a past surgical history that includes Cholecystectomy; Tonsillectomy; ileojejunal bypass (1976); Back surgery (1994); laparotomy (N/A, 09/18/2014); greenfield filter; Vascular surgery (Right); Colonoscopy; Upper gi endoscopy; and Total knee arthroplasty (Right, 08/26/2019).   Her family history includes Cervical cancer in her mother; Cirrhosis in her sister; Kidney disease in her sister; Liver disease in her sister; Stroke in her mother.She reports that she has never smoked. She has never used smokeless tobacco. She reports that she does not drink alcohol and does not use drugs.  Current Outpatient Medications on File Prior  to Visit  Medication Sig Dispense Refill   acetaminophen (TYLENOL) 500 MG tablet Take 2 tablets (1,000 mg total) by mouth every 8 (eight) hours. 30 tablet 0   albuterol (PROVENTIL HFA;VENTOLIN HFA) 108 (90 BASE) MCG/ACT inhaler Inhale 1-2 puffs into the lungs every 6 (six) hours as needed for wheezing or shortness of breath.     amitriptyline (ELAVIL) 100 MG tablet Take 1 tablet (100 mg total) by mouth daily. 90 tablet 1   aspirin EC 81 MG tablet Take 81 mg by mouth  daily. Swallow whole.     azelastine (OPTIVAR) 0.05 % ophthalmic solution Place 1 drop into both eyes daily as needed (for dry eyes).     benzonatate (TESSALON) 100 MG capsule TAKE 1 CAPSULE BY MOUTH 3 TIMES DAILY AS NEEDED FOR COUGH 30 capsule 0   budesonide-formoterol (SYMBICORT) 160-4.5 MCG/ACT inhaler Inhale 2 puffs into the lungs See admin instructions. Inhale 2 puffs into the lungs one to two times a day 1 Inhaler 2   cephALEXin (KEFLEX) 500 MG capsule Take 1 capsule (500 mg total) by mouth 3 (three) times daily. 21 capsule 0   cetirizine (ZYRTEC) 10 MG tablet Take 10 mg by mouth daily.      cyanocobalamin (,VITAMIN B-12,) 1000 MCG/ML injection INJECT 1CC INTO THE MUSCLE EVERY MONTH 10 mL 1   dicyclomine (BENTYL) 10 MG capsule Take 1 capsule (10 mg total) by mouth 3 (three) times daily as needed for diarrhea and symptoms. 810 capsule 0   diphenoxylate-atropine (LOMOTIL) 2.5-0.025 MG tablet Take 1 tablet by mouth 3 times daily as needed for diarrhea or loose stools. 270 tablet 1   Fe Fum-FA-B Cmp-C-Zn-Mg-Mn-Cu (HEMOCYTE PLUS) 106-1 MG CAPS TAKE 1 CAPSULE BY MOUTH 2 TIMES DAILY 60 capsule 1   Fe Fum-FA-B Cmp-C-Zn-Mg-Mn-Cu (HEMOCYTE PLUS) 106-1 MG CAPS TAKE 1 CAPSULE BY MOUTH TWO TIMES DAILY 60 capsule 1   fluticasone (FLONASE) 50 MCG/ACT nasal spray Place 2 sprays into the nose daily.     gabapentin (NEURONTIN) 300 MG capsule TAKE 1 CAPSULE BY MOUTH AT BEDTIME 90 capsule 1   hydrOXYzine (ATARAX) 25 MG tablet Take 25 mg by mouth 3 (three) times daily as needed for itching.     levocetirizine (XYZAL) 5 MG tablet Take 1 tablet by mouth in the evening 90 tablet 1   magnesium oxide (MAG-OX) 400 MG tablet Take 400 mg by mouth 2 (two) times daily.     montelukast (SINGULAIR) 10 MG tablet Take 10 mg by mouth at bedtime.     montelukast (SINGULAIR) 10 MG tablet Take 1 tablet by mouth in the evening 90 tablet 2   omeprazole (PRILOSEC) 20 MG capsule Take by mouth daily. 90 capsule 0   ondansetron  (ZOFRAN-ODT) 4 MG disintegrating tablet Take 1 tablet (4 mg total) by mouth 3 (three) times daily as needed for nausea or vomiting. 20 tablet 0   phenazopyridine (PYRIDIUM) 100 MG tablet Take 1 tablet (100 mg total) by mouth 3 (three) times daily as needed for pain. 20 tablet 0   potassium chloride SA (KLOR-CON) 20 MEQ tablet Take 1 tablet (20 mEq total) by mouth daily. 90 tablet 1   Probiotic Product (PROBIOTIC DAILY PO) Take 1 capsule by mouth daily.     valACYclovir (VALTREX) 500 MG tablet TAKE 1 TABLET BY MOUTH ONCE A DAY AS NEEDED. 30 tablet 5   Vitamin D, Ergocalciferol, (DRISDOL) 1.25 MG (50000 UNIT) CAPS capsule TAKE 1 CAPSULE BY MOUTH EVERY 7 DAYS 12 capsule 0  zolpidem (AMBIEN) 5 MG tablet TAKE 1 TABLET BY MOUTH AT BEDTIME 90 tablet 1   No current facility-administered medications on file prior to visit.     Objective:  Objective  Physical Exam Constitutional:      General: She is not in acute distress.    Appearance: Normal appearance. She is not ill-appearing or toxic-appearing.  HENT:     Head: Normocephalic and atraumatic.     Right Ear: Tympanic membrane, ear canal and external ear normal.     Left Ear: Tympanic membrane, ear canal and external ear normal.     Nose: No congestion or rhinorrhea.  Eyes:     Extraocular Movements: Extraocular movements intact.     Pupils: Pupils are equal, round, and reactive to light.  Cardiovascular:     Rate and Rhythm: Normal rate and regular rhythm.     Pulses: Normal pulses.     Heart sounds: Normal heart sounds. No murmur heard. Pulmonary:     Effort: Pulmonary effort is normal. No respiratory distress.     Breath sounds: Normal breath sounds. No wheezing, rhonchi or rales.  Abdominal:     General: Bowel sounds are normal.     Palpations: Abdomen is soft. There is no mass.     Tenderness: no abdominal tenderness There is no guarding.     Hernia: No hernia is present.     Comments: -bowel sounds more frequent  Musculoskeletal:         General: Normal range of motion.     Cervical back: Normal range of motion and neck supple.  Skin:    General: Skin is warm and dry.  Neurological:     Mental Status: She is alert and oriented to person, place, and time.  Psychiatric:        Behavior: Behavior normal.   BP 112/70   Pulse 60   Temp 97.9 F (36.6 C)   Resp 16   Wt 168 lb 8 oz (76.4 kg)   BMI 27.61 kg/m  Wt Readings from Last 3 Encounters:  11/11/20 168 lb 8 oz (76.4 kg)  09/21/20 169 lb 12.8 oz (77 kg)  08/30/20 170 lb 12.8 oz (77.5 kg)     Lab Results  Component Value Date   WBC 9.0 08/30/2020   HGB 11.5 (L) 08/30/2020   HCT 35.0 (L) 08/30/2020   PLT 215.0 08/30/2020   GLUCOSE 81 08/30/2020   CHOL 136 08/30/2020   TRIG 172.0 (H) 08/30/2020   HDL 44.60 08/30/2020   LDLDIRECT 76.0 10/04/2018   LDLCALC 57 08/30/2020   ALT 8 08/30/2020   AST 11 08/30/2020   NA 138 08/30/2020   K 4.0 08/30/2020   CL 103 08/30/2020   CREATININE 0.51 08/30/2020   BUN 14 08/30/2020   CO2 26 08/30/2020   TSH 1.82 08/30/2020   INR 1.1 07/11/2020   HGBA1C 5.3 08/30/2020    CT Head Wo Contrast  Result Date: 09/04/2020 CLINICAL DATA:  Intracranial hemorrhage, follow-up EXAM: CT HEAD WITHOUT CONTRAST TECHNIQUE: Contiguous axial images were obtained from the base of the skull through the vertex without intravenous contrast. COMPARISON:  07/12/2020 FINDINGS: Brain: There is no acute intracranial hemorrhage, mass effect, or edema. Trace right frontal subarachnoid hemorrhage has expectedly resolved. Gray-white differentiation is preserved. Stable findings of chronic microvascular ischemic changes. There is no extra-axial fluid collection. Ventricles and sulci are stable in size and configuration. Vascular: There is atherosclerotic calcification at the skull base. Skull: Calvarium is unremarkable. Sinuses/Orbits: No  acute finding. Other: None. IMPRESSION: Expected resolution of trace subarachnoid hemorrhage. No new findings.  Electronically Signed   By: Macy Mis M.D.   On: 09/04/2020 14:29     Assessment & Plan:  Plan    Meds ordered this encounter  Medications   ALPRAZolam (XANAX) 0.5 MG tablet    Sig: Take 1 tablet by mouth at bedtime as needed for anxiety.    Dispense:  30 tablet    Refill:  3    Problem List Items Addressed This Visit     Essential hypertension (Chronic)    Well controlled, no changes to meds. Encouraged heart healthy diet such as the DASH diet and exercise as tolerated.       Anxiety state    Has had a great deal of stress around her husband's poor health. Is allowed a refill on Alprazolam to use prn as sparingly as possible      Relevant Medications   ALPRAZolam (XANAX) 0.5 MG tablet   DISC DISEASE, LUMBOSACRAL SPINE    Encouraged moist heat and gentle stretching as tolerated. May try NSAIDs and prescription meds as directed and report if symptoms worsen or seek immediate care      Chronic diarrhea    Roughly 2 episodes most days but can increase to 4-5 x a day, no bloody or tarry stool. Avoid offending foods and consider Benefiber 2-3 x a day and follow up with gastroenterology      Atrial fibrillation ([place])    Rate controlled, following with cariology      RESOLVED: Diarrhea   Other Visit Diagnoses     High risk medication use    -  Primary   Relevant Orders   DRUG MONITORING, PANEL 8 WITH CONFIRMATION, URINE (Completed)   Urinary tract infection without hematuria, site unspecified       Relevant Orders   Urinalysis   Urine Culture (Completed)       Follow-up: Return in about 10 weeks (around 01/20/2021).  I, Suezanne Jacquet, acting as a scribe for Penni Homans, MD, have documented all relevent documentation on behalf of Penni Homans, MD, as directed by Penni Homans, MD while in the presence of Penni Homans, MD.  I, Mosie Lukes, MD personally performed the services described in this documentation. All medical record entries made by the scribe were at  my direction and in my presence. I have reviewed the chart and agree that the record reflects my personal performance and is accurate and complete

## 2020-11-12 ENCOUNTER — Other Ambulatory Visit (HOSPITAL_COMMUNITY): Payer: Self-pay

## 2020-11-12 ENCOUNTER — Telehealth: Payer: Self-pay | Admitting: Family Medicine

## 2020-11-12 ENCOUNTER — Other Ambulatory Visit: Payer: Self-pay | Admitting: Family Medicine

## 2020-11-12 ENCOUNTER — Ambulatory Visit: Payer: PPO | Attending: Internal Medicine

## 2020-11-12 DIAGNOSIS — Z23 Encounter for immunization: Secondary | ICD-10-CM

## 2020-11-12 LAB — DRUG MONITORING, PANEL 8 WITH CONFIRMATION, URINE
6 Acetylmorphine: NEGATIVE ng/mL (ref <10)
Alcohol Metabolites: NEGATIVE ng/mL (ref <500)
Amphetamines: NEGATIVE ng/mL (ref <500)
Benzodiazepines: NEGATIVE ng/mL (ref <100)
Buprenorphine, Urine: NEGATIVE ng/mL (ref <5)
Cocaine Metabolite: NEGATIVE ng/mL (ref <150)
Creatinine: 34.6 mg/dL (ref >=20.0)
MDMA: NEGATIVE ng/mL (ref <500)
Marijuana Metabolite: NEGATIVE ng/mL (ref <20)
Opiates: NEGATIVE ng/mL (ref <100)
Oxidant: NEGATIVE ug/mL (ref <200)
Oxycodone: NEGATIVE ng/mL (ref <100)
pH: 5.2 (ref 4.5–9.0)

## 2020-11-12 LAB — DM TEMPLATE

## 2020-11-12 LAB — URINE CULTURE
MICRO NUMBER:: 12260805
Result:: NO GROWTH
SPECIMEN QUALITY:: ADEQUATE

## 2020-11-12 MED ORDER — FLUCONAZOLE 150 MG PO TABS
150.0000 mg | ORAL_TABLET | ORAL | 1 refills | Status: DC
  Filled 2020-11-12: qty 2, 14d supply, fill #0

## 2020-11-12 NOTE — Telephone Encounter (Signed)
Alexis Olson states she has a yeast infection. She's been itching all night. Please call in a Difucan to [facility] long or med center down stairs

## 2020-11-12 NOTE — Telephone Encounter (Signed)
PT called back regarding the yeast infection medication. Made patient aware that Dr. Charlett Blake has not yet responded to it. She stated she understood.

## 2020-11-12 NOTE — Telephone Encounter (Signed)
Called the patient informed prescription sent in.

## 2020-11-12 NOTE — Progress Notes (Signed)
   Covid-19 Vaccination Clinic  Name:  Alexis Olson    MRN: [id number] DOB: 1933-07-22  11/12/2020  Alexis Olson was observed post Covid-19 immunization for 15 minutes without incident. She was provided with Vaccine Information Sheet and instruction to access the V-Safe system.   Alexis Olson was instructed to call 911 with any severe reactions post vaccine: Difficulty breathing  Swelling of face and throat  A fast heartbeat  A bad rash all over body  Dizziness and weakness   Immunizations Administered     Name Date Dose VIS Date Route   PFIZER Comrnaty(Gray TOP) Covid-19 Vaccine 11/12/2020  2:22 PM 0.3 mL 03/04/2020 Intramuscular   Manufacturer: [place]   Lot: [id number]   NDC: [phone]

## 2020-11-13 ENCOUNTER — Other Ambulatory Visit (HOSPITAL_COMMUNITY): Payer: Self-pay

## 2020-11-14 NOTE — Assessment & Plan Note (Signed)
Well controlled, no changes to meds. Encouraged heart healthy diet such as the DASH diet and exercise as tolerated.  °

## 2020-11-14 NOTE — Assessment & Plan Note (Signed)
Encouraged moist heat and gentle stretching as tolerated. May try NSAIDs and prescription meds as directed and report if symptoms worsen or seek immediate care 

## 2020-11-14 NOTE — Assessment & Plan Note (Deleted)
Roughly 2 episodes most days but can increase to 4-5 x a day, no bloody or tarry stool. Avoid offending foods and consider Benefiber 2-3 x a day and follow up with gastroenterology

## 2020-11-14 NOTE — Assessment & Plan Note (Signed)
Has had a great deal of stress around her husband's poor health. Is allowed a refill on Alprazolam to use prn as sparingly as possible

## 2020-11-14 NOTE — Assessment & Plan Note (Signed)
Roughly 2 episodes most days but can increase to 4-5 x a day, no bloody or tarry stool. Avoid offending foods and consider Benefiber 2-3 x a day and follow up with gastroenterology

## 2020-11-14 NOTE — Assessment & Plan Note (Signed)
Rate controlled, following with cariology

## 2020-11-15 ENCOUNTER — Other Ambulatory Visit: Payer: Self-pay

## 2020-11-15 ENCOUNTER — Other Ambulatory Visit (HOSPITAL_COMMUNITY): Payer: Self-pay

## 2020-11-15 ENCOUNTER — Ambulatory Visit (HOSPITAL_COMMUNITY): Payer: PPO | Attending: Cardiology

## 2020-11-15 DIAGNOSIS — I350 Nonrheumatic aortic (valve) stenosis: Secondary | ICD-10-CM

## 2020-11-15 DIAGNOSIS — I480 Paroxysmal atrial fibrillation: Secondary | ICD-10-CM | POA: Diagnosis not present

## 2020-11-15 LAB — ECHOCARDIOGRAM COMPLETE
AR max vel: 1.14 cm2
AV Area VTI: 1.23 cm2
AV Area mean vel: 1.07 cm2
AV Mean grad: 9.2 mmHg
AV Peak grad: 15.3 mmHg
Ao pk vel: 1.95 m/s
Area-P 1/2: 3.12 cm2
S' Lateral: 2.95 cm

## 2020-11-16 ENCOUNTER — Telehealth: Payer: Self-pay | Admitting: Internal Medicine

## 2020-11-16 ENCOUNTER — Other Ambulatory Visit (HOSPITAL_COMMUNITY): Payer: Self-pay

## 2020-11-16 NOTE — Telephone Encounter (Signed)
Patient was returning call for results. Please advise °

## 2020-11-16 NOTE — Telephone Encounter (Signed)
Spoke with pt and made her aware of echo results and recommendations.  Pt states she thought she cancelled all of the appts because they are leaving for the beach tomorrow and not coming back until 9/5.  Rescheduled her appt to 9/9 with Laurann Montana, NP at [facility].  Pt verbalized understanding and was in agreement with plan.

## 2020-11-17 ENCOUNTER — Other Ambulatory Visit (HOSPITAL_COMMUNITY): Payer: Self-pay

## 2020-11-18 ENCOUNTER — Other Ambulatory Visit (HOSPITAL_COMMUNITY): Payer: Self-pay

## 2020-11-20 ENCOUNTER — Other Ambulatory Visit (HOSPITAL_COMMUNITY): Payer: Self-pay

## 2020-11-22 ENCOUNTER — Other Ambulatory Visit (HOSPITAL_COMMUNITY): Payer: Self-pay

## 2020-11-23 ENCOUNTER — Ambulatory Visit: Payer: PPO | Admitting: Physician Assistant

## 2020-11-30 ENCOUNTER — Other Ambulatory Visit (HOSPITAL_COMMUNITY): Payer: Self-pay

## 2020-12-03 ENCOUNTER — Encounter (HOSPITAL_BASED_OUTPATIENT_CLINIC_OR_DEPARTMENT_OTHER): Payer: Self-pay | Admitting: Family

## 2020-12-03 ENCOUNTER — Other Ambulatory Visit (HOSPITAL_COMMUNITY): Payer: Self-pay

## 2020-12-03 ENCOUNTER — Other Ambulatory Visit: Payer: Self-pay

## 2020-12-03 ENCOUNTER — Ambulatory Visit (INDEPENDENT_AMBULATORY_CARE_PROVIDER_SITE_OTHER): Payer: PPO | Admitting: Family

## 2020-12-03 VITALS — BP 124/66 | HR 73 | Ht 65.5 in | Wt 173.4 lb

## 2020-12-03 DIAGNOSIS — R296 Repeated falls: Secondary | ICD-10-CM

## 2020-12-03 DIAGNOSIS — I491 Atrial premature depolarization: Secondary | ICD-10-CM

## 2020-12-03 DIAGNOSIS — I480 Paroxysmal atrial fibrillation: Secondary | ICD-10-CM

## 2020-12-03 DIAGNOSIS — Z86718 Personal history of other venous thrombosis and embolism: Secondary | ICD-10-CM | POA: Diagnosis not present

## 2020-12-03 DIAGNOSIS — E782 Mixed hyperlipidemia: Secondary | ICD-10-CM | POA: Diagnosis not present

## 2020-12-03 DIAGNOSIS — I10 Essential (primary) hypertension: Secondary | ICD-10-CM

## 2020-12-03 DIAGNOSIS — I350 Nonrheumatic aortic (valve) stenosis: Secondary | ICD-10-CM

## 2020-12-03 DIAGNOSIS — Z8679 Personal history of other diseases of the circulatory system: Secondary | ICD-10-CM

## 2020-12-03 DIAGNOSIS — I5020 Unspecified systolic (congestive) heart failure: Secondary | ICD-10-CM

## 2020-12-03 MED ORDER — METOPROLOL SUCCINATE ER 25 MG PO TB24
25.0000 mg | ORAL_TABLET | Freq: Every day | ORAL | 5 refills | Status: DC
  Filled 2020-12-03: qty 30, 30d supply, fill #0
  Filled 2021-01-07: qty 30, 30d supply, fill #1
  Filled 2021-02-19: qty 30, 30d supply, fill #2
  Filled 2021-03-26: qty 30, 30d supply, fill #3
  Filled 2021-04-25: qty 30, 30d supply, fill #4
  Filled 2021-05-25: qty 30, 30d supply, fill #5

## 2020-12-03 NOTE — Patient Instructions (Addendum)
Medication Instructions:  Your physician has recommended you make the following change in your medication:  START Metoprolol Succinate one '25mg'$  tablet daily  *If you need a refill on your cardiac medications before your next appointment, please call your pharmacy*   Lab Work: Your physician recommends that you return for lab work today for BMP, CBC  If you have labs (blood work) drawn today and your tests are completely normal, you will receive your results only by: [place] (if you have MyChart) OR A paper copy in the mail If you have any lab test that is abnormal or we need to change your treatment, we will call you to review the results.   Testing/Procedures: Your echocardiogram showed your heart was pumping 45-50% the normal is 60-65%. Your Metoprolol will help to strengthen your heart.    Follow-Up: At [facility], you and your health needs are our priority.  As part of our continuing mission to provide you with exceptional heart care, we have created designated Provider Care Teams.  These Care Teams include your primary Cardiologist (physician) and Advanced Practice Providers (APPs -  Physician Assistants and Nurse Practitioners) who all work together to provide you with the care you need, when you need it.  We recommend signing up for the patient portal called "MyChart".  Sign up information is provided on this After Visit Summary.  MyChart is used to connect with patients for Virtual Visits (Telemedicine).  Patients are able to view lab/test results, encounter notes, upcoming appointments, etc.  Non-urgent messages can be sent to your provider as well.   To learn more about what you can do with MyChart, go to [email or web address].    Your next appointment:   3-4 month(s)  The format for your next appointment:   In Person  Provider:   You may see Dorris Carnes, MD or one of the following Advanced Practice Providers on your designated Care Team:   Richardson Dopp,  PA-C [place], [place] Loel Dubonnet, NP    Other Instructions  Loel Dubonnet, NP will reach out to Dr. Harrington Challenger about your Eliquis.   Heart Healthy Diet Recommendations: A low-salt diet is recommended. Meats should be grilled, baked, or boiled. Avoid fried foods. Focus on lean protein sources like fish or chicken with vegetables and fruits. The American Heart Association is a Microbiologist!  Exercise recommendations: The American Heart Association recommends 150 minutes of moderate intensity exercise weekly. Try 30 minutes of moderate intensity exercise 4-5 times per week. This could include walking, jogging, or swimming.   To prevent or reduce lower extremity swelling: Eat a low salt diet. Salt makes the body hold onto extra fluid which causes swelling. Sit with legs elevated. For example, in the recliner or on an [place].  Wear knee-high compression stockings during the daytime. Ones labeled 15-20 mmHg provide good compression.

## 2020-12-03 NOTE — Progress Notes (Signed)
Cardiology Clinic Note   Patient Name: Alexis Olson Date of Encounter: 12/03/2020  Primary Care Provider:  Mosie Lukes, MD Primary Cardiologist:  Dorris Carnes, MD Electrophysiologist:  None   Patient Profile    Alexis Olson is a 87 y.o. female with a hx of paroxysmal atrial fibrillation, grade 1 diastolic dysfunction by echo 10/2019, mild aortic stenosis, mild to moderate tricuspid regurgitation, frequent falls, COVID-19 04/2020 not requiring hospitalization, hypertension, DVT, GERD, anxiety, rheumatic fever, HLD, SAH, arthritis, gastric bypass surgery, anemia. Presents today for follow up after echocardiogram.   Past Medical History    Past Medical History:  Diagnosis Date   ALLERGIC RHINITIS    ANEMIA, CHRONIC    Malabsorption related to bypass hx, B12 and iron deficiency   ANXIETY    Arthritis    ASTHMA    Bariatric surgery status    Bowel perforation ([place]) 2016   C. difficile diarrhea    Colon perforation ([place]) 08/2014   following polypetomy during colo, surgical repair   COLONIC POLYPS, ADENOMATOUS, HX OF 2010   DIVERTICULOSIS, COLON    DVT (deep venous thrombosis) ([place]) 2018   DVT (deep venous thrombosis) (H[facility])    GERD (gastroesophageal reflux disease) 08/23/2015   Graded compression stocking in place    History of blood transfusion    History of migraine    History of rheumatic fever    HSV (herpes simplex virus) infection 08/16/2015   Hyperlipidemia    Hypertension    Denies   Hyponatremia 05/02/2016   Incomplete left bundle branch block (LBBB) 2016   Noted on EKG   INSOMNIA    Lumbosacral disc disease    Chronic pain; History of osteomyelitis 2010 following ESI complication   Obesity    Osteomyelitis (HCC)    Pelvic fracture (HCC)    Pneumonia    Recurrent UTI 08/23/2015   URINARY URGENCY    Ventral hernia without obstruction or gangrene 05/22/2018   VITAMIN B12 DEFICIENCY    Past Surgical History:  Procedure Laterality Date   BACK SURGERY  1994    for ruptured disc   CHOLECYSTECTOMY     COLONOSCOPY     greenfield filter     ileojejunal bypass  1976   for obesity   LAPAROTOMY N/A 09/18/2014   Procedure: EXPLORATORY LAPAROTOMY WITH REPAIR OF CECIAL PERFORATION;  Surgeon: Autumn Messing III, MD;  Location: [facility];  Service: General;  Laterality: N/A;   TONSILLECTOMY     TOTAL KNEE ARTHROPLASTY Right 08/26/2019   Procedure: RIGHT TOTAL KNEE ARTHROPLASTY, LEFT KNEE CORTISONE INJECTION;  Surgeon: Paralee Cancel, MD;  Location: [facility];  Service: Orthopedics;  Laterality: Right;  70 MINS   UPPER GI ENDOSCOPY     VASCULAR SURGERY Right    revascularization right lower extremity    Allergies  Allergies  Allergen Reactions   Ativan [Lorazepam] Anaphylaxis and Other (See Comments)    Deathly allergic; "resp rate dropped dropped to 6"   Other Anaphylaxis    Red peppers--choking, also   Azithromycin Diarrhea   Benadryl [Diphenhydramine Hcl] Other (See Comments)    Paradoxical response   Ciprofloxacin Hcl Other (See Comments)    Causes yeast infection and refuses to take   Codeine Phosphate Nausea Only    Can tolerate in limited amounts   Hydrocodone Itching and Nausea And Vomiting   Hydrocodone Bit-Homatrop Mbr Itching and Nausea And Vomiting   Levaquin [Levofloxacin In D5w] Other (See Comments)    Muscle soreness  Levofloxacin    Meperidine Hcl Other (See Comments)    Reaction ??   Molds & Smuts Other (See Comments)    unsure   Sulfa Antibiotics Other (See Comments)    Joint pain   Tape Other (See Comments)    No Coban wrap (per the patient)   Tramadol    Ultram [Tramadol Hcl] Other (See Comments)    "Can't move joints"    History of Present Illness   Alexis Olson is a 87 y.o. female with a hx of paroxysmal atrial fibrillation, grade 1 diastolic dysfunction by echo 10/2019, mild aortic stenosis, mild to moderate tricuspid regurgitation, frequent falls, COVID-19 04/2020 not requiring hospitalization, hypertension, DVT, GERD,  anxiety, rheumatic fever, HLD, SAH, arthritis, gastric bypass surgery, anemia. She was last seen 09/21/20.  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 Her daughter is now an Therapist, sports in the emergency department.  She underwent right TKA 08/2019. She was hospitalized 10/2019 after accidental fall with CT of head and C-spine without acute findings. Acute metabolic encephalopathy was likely due to polypharmacy. She had LLE acute DVT and was started on Eliquis. Considered provoked DVT in setting of previous surgery. Echo 10/2019 LVEF 60-65%, no RWMA, gr1DD, mild to moderate TR, mild aortic stenosis. Seen in the ED 11/2019 and 12/2019 for cellulitis of the LLE. Venous duplex 04/08/20 with chronic nonocclusive thrombus with septation in right and left common femoral vein and no evidence of acute DVT.  She had COVID19 04/2020 and received monoclonal antibodies. She did not require hospitalization.   Admitted 07/05/20-07/10/20  with initial concern for septic arthritis but final diagnosis inflammatory arthritis. SNF was declined and home health arranged. She was readmitted 07/11/20 - 07/13/20 after fall. Imaging showed small punctuate subarachnoid hemorhage for which neurosurgery did not recommend intervention. Her Eliquis was discontinued. Her telemetry with frequent PAC's, occasional bradycardia, and one 2-3 second pause. She was discharged with 30 day Preventice monitor . Monitor with  predominantly normal sinus rhythm with average heart rate of 90 bpm. She had atrial fibrillation which was new finding with <1% burden and total length 2h4mn. She had frequent PAC's and rare PVC's. Dr. RHarrington Challengercalled to discuss and as she reported no palpitations and no recurrent falls, she was recommended to start Aspirin and referred to home health for vestibular training and objective evaluation of falls risk.  Repeat CT head 09/04/20 showed resolution of subarachnoid hemorrhage.   She was seen in follow-up 09/21/2020.  She reported no palpitations and no  falls since most recent discharge.  She did note that she was potentially going to undergo knee surgery and had DVT after previous TKA and known chronic nonocclusive bilateral lower extremity DVT.  She was recommended for updated echocardiogram for monitoring of mild aortic stenosis which was performed 11/15/20 showing newly reduced LVEF 45-50%, gr1DD, trivial MR, stable mild AS.  She presents today for follow up. Her husband has been undergoing multiple thoracentesis but shares with me that recent PET scan was reassuring. Does have history of rheumatic fever when she was in the fourth grade. Tells me she has been noting lower extremity edema. Reports no falls since last seen. Tells me her balance is "pretty good". She continues to use a cane as her left knee is "bone on bone" and will require surgical intervention. She reports no lightheadedness nor dizziness. We reviewed her echocardiogram in detail.  EKGs/Labs/Other Studies Reviewed:   The following studies were reviewed today:  Echo 11/15/2020 1. DIfficult acoustic windows make evaluation  difficult. LVEF is  depressed with hypokinessis of the inferoseptum and Basal inferior  hypokinesis. Compared to echo from 2021, LVEF appears mildly worse. Left  ventricular ejection fraction, by estimation, is   45 to 50%. The left ventricle has mildly decreased function. Left  ventricular diastolic parameters are consistent with Grade I diastolic  dysfunction (impaired relaxation). Elevated left atrial pressure.   2. Right ventricular systolic function is normal. The right ventricular  size is normal.   3. Trivial mitral valve regurgitation.   4. Tricuspid valve regurgitation is mild to moderate.   5. AV is thickened, calcified with mildly restricted motion Peak and mean  gradients through the valve are 17 and 10 mm Hg respectively consistent  with mild AS . The aortic valve is tricuspid. Aortic valve regurgitation  is not visualized.   6. The inferior  vena cava is normal in size with greater than 50%  respiratory variability, suggesting right atrial pressure of 3 mmHg.   Preventice 30 day monitor 07/17/20 1. NSR with sinus brady and sinus tachycardia 2. Frequent PAC's and rare PVC's 3. Atrial fib with a controlled VR and a RVR (2h 47 minutes total) 4. No prolonged pauses 5. Noise artifact  Echo 11/07/19 1. Left ventricular ejection fraction, by estimation, is 60 to 65%. The  left ventricle has normal function. The left ventricle has no regional  wall motion abnormalities. Left ventricular diastolic parameters are  consistent with Grade I diastolic  dysfunction (impaired relaxation).   2. Right ventricular systolic function is normal. The right ventricular  size is normal. There is normal pulmonary artery systolic pressure.   3. The mitral valve is normal in structure. No evidence of mitral valve  regurgitation. No evidence of mitral stenosis.   4. Tricuspid valve regurgitation is mild to moderate.   5. The aortic valve is normal in structure. Aortic valve regurgitation is  not visualized. Mild aortic valve stenosis.   6. The inferior vena cava is normal in size with greater than 50%  respiratory variability, suggesting right atrial pressure of 3 mmHg.   Recent Labs: 08/30/2020: ALT 8; BUN 14; Creatinine, Ser 0.51; Hemoglobin 11.5; Magnesium 1.8; Platelets 215.0; Potassium 4.0; Sodium 138; TSH 1.82  Recent Lipid Panel    Component Value Date/Time   CHOL 136 08/30/2020 1147   TRIG 172.0 (H) 08/30/2020 1147   HDL 44.60 08/30/2020 1147   CHOLHDL 3 08/30/2020 1147   VLDL 34.4 08/30/2020 1147   LDLCALC 57 08/30/2020 1147   LDLDIRECT 76.0 10/04/2018 1402    Home Medications      Current Meds  Medication Sig   acetaminophen (TYLENOL) 500 MG tablet Take 2 tablets (1,000 mg total) by mouth every 8 (eight) hours.   albuterol (PROVENTIL HFA;VENTOLIN HFA) 108 (90 BASE) MCG/ACT inhaler Inhale 1-2 puffs into the lungs every 6 (six)  hours as needed for wheezing or shortness of breath.   ALPRAZolam (XANAX) 0.5 MG tablet Take 1 tablet by mouth at bedtime as needed for anxiety.   amitriptyline (ELAVIL) 100 MG tablet Take 1 tablet (100 mg total) by mouth daily.   aspirin EC 81 MG tablet Take 81 mg by mouth daily. Swallow whole.   azelastine (OPTIVAR) 0.05 % ophthalmic solution Place 1 drop into both eyes daily as needed (for dry eyes).   benzonatate (TESSALON) 100 MG capsule TAKE 1 CAPSULE BY MOUTH 3 TIMES DAILY AS NEEDED FOR COUGH   budesonide-formoterol (SYMBICORT) 160-4.5 MCG/ACT inhaler Inhale 2 puffs into the lungs See admin instructions.  Inhale 2 puffs into the lungs one to two times a day   cephALEXin (KEFLEX) 500 MG capsule Take 1 capsule (500 mg total) by mouth 3 (three) times daily.   cetirizine (ZYRTEC) 10 MG tablet Take 10 mg by mouth daily.    cyanocobalamin (,VITAMIN B-12,) 1000 MCG/ML injection INJECT 1CC INTO THE MUSCLE EVERY MONTH   dicyclomine (BENTYL) 10 MG capsule Take 1 capsule (10 mg total) by mouth 3 (three) times daily as needed for diarrhea and symptoms.   diphenoxylate-atropine (LOMOTIL) 2.5-0.025 MG tablet Take 1 tablet by mouth 3 times daily as needed for diarrhea or loose stools.   Fe Fum-FA-B Cmp-C-Zn-Mg-Mn-Cu (HEMOCYTE PLUS) 106-1 MG CAPS TAKE 1 CAPSULE BY MOUTH 2 TIMES DAILY   Fe Fum-FA-B Cmp-C-Zn-Mg-Mn-Cu (HEMOCYTE PLUS) 106-1 MG CAPS TAKE 1 CAPSULE BY MOUTH TWO TIMES DAILY   fluconazole (DIFLUCAN) 150 MG tablet Take 1 tablet (150 mg total) by mouth once a week.   fluticasone (FLONASE) 50 MCG/ACT nasal spray Place 2 sprays into the nose daily.   gabapentin (NEURONTIN) 300 MG capsule TAKE 1 CAPSULE BY MOUTH AT BEDTIME   hydrOXYzine (ATARAX) 25 MG tablet Take 25 mg by mouth 3 (three) times daily as needed for itching.   levocetirizine (XYZAL) 5 MG tablet Take 1 tablet by mouth in the evening   magnesium oxide (MAG-OX) 400 MG tablet Take 400 mg by mouth 2 (two) times daily.   metoprolol succinate  (TOPROL XL) 25 MG 24 hr tablet Take 1 tablet (25 mg total) by mouth daily.   montelukast (SINGULAIR) 10 MG tablet Take 10 mg by mouth at bedtime.   montelukast (SINGULAIR) 10 MG tablet Take 1 tablet by mouth in the evening   omeprazole (PRILOSEC) 20 MG capsule Take by mouth daily.   ondansetron (ZOFRAN-ODT) 4 MG disintegrating tablet Take 1 tablet (4 mg total) by mouth 3 (three) times daily as needed for nausea or vomiting.   phenazopyridine (PYRIDIUM) 100 MG tablet Take 1 tablet (100 mg total) by mouth 3 (three) times daily as needed for pain.   potassium chloride SA (KLOR-CON) 20 MEQ tablet Take 1 tablet (20 mEq total) by mouth daily.   Probiotic Product (PROBIOTIC DAILY PO) Take 1 capsule by mouth daily.   valACYclovir (VALTREX) 500 MG tablet TAKE 1 TABLET BY MOUTH ONCE A DAY AS NEEDED.   Vitamin D, Ergocalciferol, (DRISDOL) 1.25 MG (50000 UNIT) CAPS capsule TAKE 1 CAPSULE BY MOUTH EVERY 7 DAYS   zolpidem (AMBIEN) 5 MG tablet TAKE 1 TABLET BY MOUTH AT BEDTIME     Family History    Family History  Problem Relation Age of Onset   Cervical cancer Mother    Stroke Mother    Liver disease Sister    Kidney disease Sister    Cirrhosis Sister    Colon cancer Neg Hx    Esophageal cancer Neg Hx    Rectal cancer Neg Hx    Stomach cancer Neg Hx    She indicated that her mother is deceased. She indicated that her father is deceased. She indicated that the status of her sister is unknown. She indicated that her brother is alive. She indicated that her daughter is alive. She indicated that her son is alive. She indicated that the status of her neg hx is unknown.  Social History    Social History   Socioeconomic History   Marital status: Married    Spouse name: Not on file   Number of children: 3   Years of  education: Not on file   Highest education level: Not on file  Occupational History   Occupation: retired Programmer, multimedia: RETIRED  Tobacco Use   Smoking status: Never   Smokeless  tobacco: Never  Substance and Sexual Activity   Alcohol use: No    Alcohol/week: 0.0 standard drinks   Drug use: No   Sexual activity: Never    Partners: Male  Other Topics Concern   Not on file  Social History Narrative   Lives with spouse, Indep ADLs   Supportive family nearby   No dietary restrictions   Social Determinants of Health   Financial Resource Strain: Not on file  Food Insecurity: No Food Insecurity   Worried About Charity fundraiser in the Last Year: Never true   [place] in the Last Year: Never true  Transportation Needs: No Transportation Needs   Lack of Transportation (Medical): No   Lack of Transportation (Non-Medical): No  Physical Activity: Not on file  Stress: Not on file  Social Connections: Not on file  Intimate Partner Violence: Not on file     Review of Systems    All other systems reviewed and are otherwise negative except as detailed above.  Physical Exam    VS:  BP 124/66   Pulse 73   Ht 5' 5.5" (1.664 m)   Wt 173 lb 6.4 oz (78.7 kg)   BMI 28.42 kg/m  , BMI Body mass index is 28.42 kg/m. GEN: Well nourished, well developed, in no acute distress. HEENT: Normal. Neck: Supple, no JVD, carotid bruits, or masses. Cardiac: RRR, no murmurs, rubs, or gallops. No clubbing, cyanosis, edema.  Radials/PT 2+ and equal bilaterally.  Respiratory:  Respirations regular and unlabored, clear to auscultation bilaterally. GI: Soft, nontender, nondistended, BS + x 4. MS: No deformity or atrophy. Skin: Warm and dry, no rash. Neuro:  Strength and sensation are intact. Psych: Normal affect.  Assessment & Plan   PAF / PAC - PAF noted by 30 day monitor 06/2020 with <1% burden. PAC on monitor with 17% burden. Denies palpitations. CHA2DS2-VASc Score = 5 [CHF History: 0, HTN History: 1, Diabetes History: 0, Stroke History: 0, Vascular Disease History: 1, Age Score: 2, Gender Score: 1].  Therefore, the patient's annual risk of stroke is 7.2 %.   No  anticoagulation due to falls risk and fall 06/2020 with subarachnoid hemorrhage which has since resolved. Continue Aspirin '81mg'$  daily.  Will discuss with primary cardiologist whether to resume anticoagulation.  Likely etiology valvular dysfunction as known mild to moderate TR and mild AS.   DVT - Chronic nonocclusive DVT. Anticoagulation on hold, as above.   Bradyarrhythmias - noted during admission 06/2020. 30 day monitor 06/2020 with minimum HR 69bpm, no significant pause. Anticipate bradycardia is false reading due to frequent PAC. No indication for PPM.  Frequent falls - Fall 10/2019 and 06/2020. Fall [id number] complicated by subarachnoid hemorrhage at which time Eliquis was discontinued. Her subarachnoid hemorrhage has since resolved by CT 09/03/20.  No falls since that time.  HFrEF / Mild aortic stenosis / Tricuspid regurgitation - Echo 10/2020 with stable mild aortic stenosis and mild to moderate TR.  EF newly reduced at 45-50%, previously 60 to 65%.  We will add Toprol 25 mg daily for GDMT.  Future considerations include low-dose losartan.  I anticipate our up titration of GDMT will be limited by relative hypotension.  Low-sodium diet, fluid restriction encouraged.    HTN - BP well controlled.  Presently managed with diet and exercise. Add Toprol, as above  HLD - Management with diet and lifestyle. 08/2020 LDL 57.   Disposition: Follow up in 3-4 month(s) with Dr. Harrington Challenger or APP.  Loel Dubonnet, NP  12/03/2020, 5:23 PM

## 2020-12-04 ENCOUNTER — Ambulatory Visit: Payer: PPO

## 2020-12-04 DIAGNOSIS — J301 Allergic rhinitis due to pollen: Secondary | ICD-10-CM | POA: Insufficient documentation

## 2020-12-04 DIAGNOSIS — H1045 Other chronic allergic conjunctivitis: Secondary | ICD-10-CM | POA: Insufficient documentation

## 2020-12-04 DIAGNOSIS — J4530 Mild persistent asthma, uncomplicated: Secondary | ICD-10-CM | POA: Insufficient documentation

## 2020-12-04 DIAGNOSIS — J3081 Allergic rhinitis due to animal (cat) (dog) hair and dander: Secondary | ICD-10-CM | POA: Insufficient documentation

## 2020-12-04 LAB — CBC
Hematocrit: 35 % (ref 34.0–46.6)
Hemoglobin: 11.3 g/dL (ref 11.1–15.9)
MCH: 30.5 pg (ref 26.6–33.0)
MCHC: 32.3 g/dL (ref 31.5–35.7)
MCV: 94 fL (ref 79–97)
Platelets: 279 10*3/uL (ref 150–450)
RBC: 3.71 x10E6/uL — ABNORMAL LOW (ref 3.77–5.28)
RDW: 13 % (ref 11.7–15.4)
WBC: 8 10*3/uL (ref 3.4–10.8)

## 2020-12-04 LAB — BASIC METABOLIC PANEL
BUN/Creatinine Ratio: 28 (ref 12–28)
BUN: 15 mg/dL (ref 8–27)
CO2: 23 mmol/L (ref 20–29)
Calcium: 9.1 mg/dL (ref 8.7–10.3)
Chloride: 100 mmol/L (ref 96–106)
Creatinine, Ser: 0.53 mg/dL — ABNORMAL LOW (ref 0.57–1.00)
Glucose: 74 mg/dL (ref 65–99)
Potassium: 4.4 mmol/L (ref 3.5–5.2)
Sodium: 138 mmol/L (ref 134–144)
eGFR: 91 mL/min/{1.73_m2} (ref >59)

## 2020-12-06 ENCOUNTER — Ambulatory Visit (INDEPENDENT_AMBULATORY_CARE_PROVIDER_SITE_OTHER): Payer: PPO | Admitting: Internal Medicine

## 2020-12-06 ENCOUNTER — Encounter: Payer: Self-pay | Admitting: Internal Medicine

## 2020-12-06 ENCOUNTER — Other Ambulatory Visit (HOSPITAL_BASED_OUTPATIENT_CLINIC_OR_DEPARTMENT_OTHER): Payer: Self-pay

## 2020-12-06 VITALS — BP 124/72 | HR 57 | Ht 64.0 in | Wt 172.2 lb

## 2020-12-06 DIAGNOSIS — K529 Noninfective gastroenteritis and colitis, unspecified: Secondary | ICD-10-CM | POA: Diagnosis not present

## 2020-12-06 DIAGNOSIS — Z9884 Bariatric surgery status: Secondary | ICD-10-CM | POA: Diagnosis not present

## 2020-12-06 MED ORDER — COVID-19 MRNA VAC-TRIS(PFIZER) 30 MCG/0.3ML IM SUSP
INTRAMUSCULAR | 0 refills | Status: DC
  Filled 2020-12-06: qty 0.3, 1d supply, fill #0

## 2020-12-06 NOTE — Patient Instructions (Signed)
Great to see you today.  Please follow up with [place] in a year or sooner if needed.   I appreciate the opportunity to care for you. Silvano Rusk, MD, [facility]

## 2020-12-06 NOTE — Progress Notes (Signed)
Alexis Olson 87 y.o. 08-Nov-1933 [id number]  Assessment & Plan:   Encounter Diagnoses  Name Primary?   Chronic diarrhea Yes   Hx of bariatric surgery - jejunoileal bypass 1960     Continue current treatment plan with dicyclomine and Lomotil.  Though she is in the age range where some considered a best practice not to use dicyclomine and Lomotil she is taking these for years and has tolerated them well.  We are not changing any of this at this time.  Return in 1 year sooner as needed. 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  Reported remote history of jejunal ileal bypass in 1960.  Alexis Olson is doing okay, she continues to use dicyclomine 20 mg and 2 Lomotil most days once a day taking all 4 of those pills or capsules at a time in the morning and then sometimes as needed up to 2 or 3 times a day.  She has done this for years with her chronic diarrhea related to her jejunal ileal bypass.  Reports that she was hoping to take her husband to the [employer] but not sure they could go [place] with his health conditions.  [place]  They did get down to [facility] the summer.   She recently had a diagnosis of atrial fibrillation and the cardiology NP recommended metoprolol though she is afraid to take this because she does not want her blood pressure to go lower.  This was recommended because she had a reduction in EF on a follow-up echo.  She is thinking about talking to Dr. Harrington Challenger about this.  She also had a fall earlier in the year and had a trace subarachnoid hemorrhage which resolved.  She is not on anticoagulation because of the fall.  She is using a bedside commode as a shower chair.  She has hand rails and grips and she thinks the fall she had was sort of a one-off issue.  Wt Readings from Last 3 Encounters:  12/06/20 172 lb  3.2 oz (78.1 kg)  12/03/20 173 lb 6.4 oz (78.7 kg)  11/11/20 168 lb 8 oz (76.4 kg)    Allergies  Allergen Reactions   Ativan [Lorazepam] Anaphylaxis and Other (See Comments)    Deathly allergic; "resp rate dropped dropped to 6"   Other Anaphylaxis    Red peppers--choking, also   Azithromycin Diarrhea   Benadryl [Diphenhydramine Hcl] Other (See Comments)    Paradoxical response   Ciprofloxacin Hcl Other (See Comments)    Causes yeast infection and refuses to take   Codeine Phosphate Nausea Only    Can tolerate in limited amounts   Hydrocodone Itching and Nausea And Vomiting   Hydrocodone Bit-Homatrop Mbr Itching and Nausea And Vomiting   Levaquin [Levofloxacin In D5w] Other (See Comments)    Muscle soreness   Levofloxacin    Meperidine Hcl Other (See Comments)    Reaction ??   Molds & Smuts Other (See Comments)    unsure   Sulfa Antibiotics Other (See Comments)    Joint pain   Tape Other (See Comments)    No Coban wrap (per the patient)   Tramadol    Ultram [Tramadol Hcl] Other (See Comments)    "Can't move joints"   Current Meds  Medication Sig   acetaminophen (TYLENOL) 500 MG tablet Take 2 tablets (1,000  mg total) by mouth every 8 (eight) hours.   albuterol (PROVENTIL HFA;VENTOLIN HFA) 108 (90 BASE) MCG/ACT inhaler Inhale 1-2 puffs into the lungs every 6 (six) hours as needed for wheezing or shortness of breath.   ALPRAZolam (XANAX) 0.5 MG tablet Take 1 tablet by mouth at bedtime as needed for anxiety.   amitriptyline (ELAVIL) 100 MG tablet Take 1 tablet (100 mg total) by mouth daily.   aspirin EC 81 MG tablet Take 81 mg by mouth daily. Swallow whole.   azelastine (OPTIVAR) 0.05 % ophthalmic solution Place 1 drop into both eyes daily as needed (for dry eyes).   benzonatate (TESSALON) 100 MG capsule TAKE 1 CAPSULE BY MOUTH 3 TIMES DAILY AS NEEDED FOR COUGH   budesonide-formoterol (SYMBICORT) 160-4.5 MCG/ACT inhaler Inhale 2 puffs into the lungs See admin instructions.  Inhale 2 puffs into the lungs one to two times a day   cephALEXin (KEFLEX) 500 MG capsule Take 1 capsule (500 mg total) by mouth 3 (three) times daily.   cetirizine (ZYRTEC) 10 MG tablet Take 10 mg by mouth daily.    cyanocobalamin (,VITAMIN B-12,) 1000 MCG/ML injection INJECT 1CC INTO THE MUSCLE EVERY MONTH   dicyclomine (BENTYL) 10 MG capsule Take 1 capsule (10 mg total) by mouth 3 (three) times daily as needed for diarrhea and symptoms.   diphenoxylate-atropine (LOMOTIL) 2.5-0.025 MG tablet Take 1 tablet by mouth 3 times daily as needed for diarrhea or loose stools.   Fe Fum-FA-B Cmp-C-Zn-Mg-Mn-Cu (HEMOCYTE PLUS) 106-1 MG CAPS TAKE 1 CAPSULE BY MOUTH 2 TIMES DAILY   Fe Fum-FA-B Cmp-C-Zn-Mg-Mn-Cu (HEMOCYTE PLUS) 106-1 MG CAPS TAKE 1 CAPSULE BY MOUTH TWO TIMES DAILY   fluconazole (DIFLUCAN) 150 MG tablet Take 1 tablet (150 mg total) by mouth once a week.   fluticasone (FLONASE) 50 MCG/ACT nasal spray Place 2 sprays into the nose daily.   gabapentin (NEURONTIN) 300 MG capsule TAKE 1 CAPSULE BY MOUTH AT BEDTIME   hydrOXYzine (ATARAX) 25 MG tablet Take 25 mg by mouth 3 (three) times daily as needed for itching.   levocetirizine (XYZAL) 5 MG tablet Take 1 tablet by mouth in the evening   magnesium oxide (MAG-OX) 400 MG tablet Take 400 mg by mouth 2 (two) times daily.   montelukast (SINGULAIR) 10 MG tablet Take 10 mg by mouth at bedtime.   montelukast (SINGULAIR) 10 MG tablet Take 1 tablet by mouth in the evening   omeprazole (PRILOSEC) 20 MG capsule Take by mouth daily.   ondansetron (ZOFRAN-ODT) 4 MG disintegrating tablet Take 1 tablet (4 mg total) by mouth 3 (three) times daily as needed for nausea or vomiting.   phenazopyridine (PYRIDIUM) 100 MG tablet Take 1 tablet (100 mg total) by mouth 3 (three) times daily as needed for pain.   potassium chloride SA (KLOR-CON) 20 MEQ tablet Take 1 tablet (20 mEq total) by mouth daily.   Probiotic Product (PROBIOTIC DAILY PO) Take 1 capsule by mouth daily.    valACYclovir (VALTREX) 500 MG tablet TAKE 1 TABLET BY MOUTH ONCE A DAY AS NEEDED.   Vitamin D, Ergocalciferol, (DRISDOL) 1.25 MG (50000 UNIT) CAPS capsule TAKE 1 CAPSULE BY MOUTH EVERY 7 DAYS   zolpidem (AMBIEN) 5 MG tablet TAKE 1 TABLET BY MOUTH AT BEDTIME   Past Medical History:  Diagnosis Date   ALLERGIC RHINITIS    ANEMIA, CHRONIC    Malabsorption related to bypass hx, B12 and iron deficiency   ANXIETY    Arthritis    ASTHMA    Bariatric  surgery status    Bowel perforation ([place]) 2016   C. difficile diarrhea    Colon perforation ([place]) 08/2014   following polypetomy during colo, surgical repair   COLONIC POLYPS, ADENOMATOUS, HX OF 2010   DIVERTICULOSIS, COLON    DVT (deep venous thrombosis) ([place]) 2018   DVT (deep venous thrombosis) ([facility])    GERD (gastroesophageal reflux disease) 08/23/2015   Graded compression stocking in place    History of blood transfusion    History of migraine    History of rheumatic fever    HSV (herpes simplex virus) infection 08/16/2015   Hyperlipidemia    Hypertension    Denies   Hyponatremia 05/02/2016   Incomplete left bundle branch block (LBBB) 2016   Noted on EKG   INSOMNIA    Lumbosacral disc disease    Chronic pain; History of osteomyelitis 2010 following ESI complication   Obesity    Osteomyelitis ([facility])    Pelvic fracture ([facility])    Pneumonia    Recurrent UTI 08/23/2015   URINARY URGENCY    Ventral hernia without obstruction or gangrene 05/22/2018   VITAMIN B12 DEFICIENCY    Past Surgical History:  Procedure Laterality Date   BACK SURGERY  1994   for ruptured disc   CHOLECYSTECTOMY     COLONOSCOPY     greenfield filter     ileojejunal bypass  1976   for obesity   LAPAROTOMY N/A 09/18/2014   Procedure: EXPLORATORY LAPAROTOMY WITH REPAIR OF CECIAL PERFORATION;  Surgeon: Autumn Messing III, MD;  Location: [facility];  Service: General;  Laterality: N/A;   TONSILLECTOMY     TOTAL KNEE ARTHROPLASTY Right 08/26/2019   Procedure: RIGHT TOTAL KNEE  ARTHROPLASTY, LEFT KNEE CORTISONE INJECTION;  Surgeon: Paralee Cancel, MD;  Location: [facility];  Service: Orthopedics;  Laterality: Right;  47 MINS   UPPER GI ENDOSCOPY     VASCULAR SURGERY Right    revascularization right lower extremity   Social History   Social History Narrative   Lives with spouse, Indep ADLs   Supportive family nearby   No dietary restrictions   family history includes Cervical cancer in her mother; Cirrhosis in her sister; Kidney disease in her sister; Liver disease in her sister; Stroke in her mother.   Review of Systems As above  Objective:   Physical Exam BP 124/72   Pulse (!) 57   Ht '5\' 4"'$  (1.626 m)   Wt 172 lb 3.2 oz (78.1 kg)   BMI 29.56 kg/m   22 minutes spent before during and after the visit and precharting time spent with the patient and post patient charting.

## 2020-12-13 ENCOUNTER — Telehealth: Payer: Self-pay | Admitting: *Deleted

## 2020-12-13 DIAGNOSIS — N3941 Urge incontinence: Secondary | ICD-10-CM | POA: Diagnosis not present

## 2020-12-13 DIAGNOSIS — N3020 Other chronic cystitis without hematuria: Secondary | ICD-10-CM | POA: Diagnosis not present

## 2020-12-13 NOTE — Telephone Encounter (Signed)
-----   Message -----  From: Loel Dubonnet, NP  Sent: 12/03/2020   5:27 PM EDT  To: Fay Records, MD   No falls since 06/2020. Subarachnoid hemorrhage resolved. PAF (<1% on monitor) and hx of DVT. Thoughts on resuming low dose Eliquis? Newly reduced LVEF - add Toprol - f/u 3-4 mos.   Routed Note  Author: Fay Records, MD Service: Cardiology Author Type: Physician  Filed: 12/13/2020 12:07 AM Encounter Date: 12/03/2020 Status: Signed  Editor: Fay Records, MD (Physician)        Pt has not had any further falls     Go ahead and resume Eliquis 5 mg bid for atrial fibrillation Need to keep track of mobility    Daughter needs to be aware if fall risk increases would stop.      _________________________________________________________________________________________________  Pre op clearance form received in Dr. Alan Ripper mailbox.  Given to Dr. Harrington Challenger for review/completion.

## 2020-12-13 NOTE — Progress Notes (Signed)
Pt has not had any further falls     Go ahead and resume Eliquis 5 mg bid for atrial fibrillation Need to keep track of mobility    Daughter needs to be aware if fall risk increases would stop.

## 2020-12-13 NOTE — Telephone Encounter (Signed)
Called pt to review that Dr. Ruffin Frederick to go ahead and resume Eliquis 5 mg BID for atrial fibrillation and that we need to keep an eye on her mobility, and would need to stop if further falls.  Patient asked me "what's Eliquis" when I adv it is the blood thinner she had been on previously she replied "You want me to go back on Eliquis? I am on baby aspirin now."  I reviewed the conversation we had when the afib was documented from the monitor and how she was very interested in being back on Eliquis so that she didn't develop clots.  Pt again stated she is taking baby aspirin, "you didn't want me on Eliquis because of the fall and small bleed that is gone now" and "okay, if you want me to take Eliquis I can."  I offered to call her daughter and review.  Pt state "oh no, she is so busy".    She has not started the Toprol yet that was prescribed by [facility] on 9/9.  She is concerned it will make her BP get low.  I adv to start with 1/2 tablet daily for few days to be sure she will tolerate it.  Pt in agreement with this.  Wants to take at night.    I reviewed this with Dr. Harrington Challenger who states at this point pt should remain on aspirin only.  I called pt back and informed her to stay on aspirin, Dr. Harrington Challenger is not sending in Eliquis for now.  Pt voices understanding.   Informed that we have received pre op clearance for her knee surgery and will be working on that.

## 2020-12-13 NOTE — Telephone Encounter (Signed)
Notes from phone call reviewed. Patient did not seem aware of what Eliquis was.   With this and with fall last spring, along with confusion around fall, I worry about the use of an anticoagulant.   She has had very little afib.    I would keep on aspirin

## 2020-12-17 ENCOUNTER — Other Ambulatory Visit (HOSPITAL_COMMUNITY): Payer: Self-pay

## 2020-12-21 ENCOUNTER — Other Ambulatory Visit: Payer: Self-pay | Admitting: Internal Medicine

## 2020-12-22 ENCOUNTER — Other Ambulatory Visit (HOSPITAL_COMMUNITY): Payer: Self-pay

## 2020-12-22 MED ORDER — OMEPRAZOLE 20 MG PO CPDR
20.0000 mg | DELAYED_RELEASE_CAPSULE | Freq: Every day | ORAL | 0 refills | Status: DC
  Filled 2020-12-22: qty 90, 90d supply, fill #0

## 2020-12-29 ENCOUNTER — Other Ambulatory Visit (HOSPITAL_COMMUNITY): Payer: Self-pay

## 2020-12-31 ENCOUNTER — Other Ambulatory Visit (HOSPITAL_BASED_OUTPATIENT_CLINIC_OR_DEPARTMENT_OTHER): Payer: Self-pay

## 2021-01-05 ENCOUNTER — Other Ambulatory Visit (HOSPITAL_COMMUNITY): Payer: Self-pay

## 2021-01-07 ENCOUNTER — Other Ambulatory Visit (HOSPITAL_COMMUNITY): Payer: Self-pay

## 2021-01-10 ENCOUNTER — Ambulatory Visit: Payer: PPO | Admitting: Medical

## 2021-01-12 NOTE — Progress Notes (Addendum)
COVID swab appointment:01/21/21   COVID Vaccine Completed: yes x4 Date COVID Vaccine completed:04/20/19, 05/12/19  Has received booster:03/15/20, 11/12/20 COVID vaccine manufacturer: [employer]      Date of COVID positive in last 90 days: no  PCP - Penni Homans, MD Cardiologist - Dorris Carnes, MD  Medical clearance by Penni Homans 01/20/21 on chart  Chest x-ray - 07/11/20 Epic EKG - 07/12/20 Epic Stress Test - long time ago per pt ECHO - 11/15/20 Epic Cardiac Cath - n/a Pacemaker/ICD device last checked: n/a Spinal Cord Stimulator: n/a  Sleep Study - n/a CPAP -   Fasting Blood Sugar - n/a Checks Blood Sugar _____ times a day  Blood Thinner Instructions: Aspirin Instructions: ASA 81, hold 5 days  Last Dose:   Activity level: Can go up a flight of stairs and perform activities of daily living without stopping and without symptoms of chest pain or shortness of breath.       Anesthesia review: HTN, DVT, a fib, asthma, SAH, LBB  Patient denies shortness of breath, fever, cough and chest pain at PAT appointment   Patient verbalized understanding of instructions that were given to them at the PAT appointment. Patient was also instructed that they will need to review over the PAT instructions again at home before surgery.

## 2021-01-15 ENCOUNTER — Other Ambulatory Visit (HOSPITAL_COMMUNITY): Payer: Self-pay

## 2021-01-17 ENCOUNTER — Other Ambulatory Visit: Payer: Self-pay

## 2021-01-17 ENCOUNTER — Encounter (HOSPITAL_COMMUNITY): Payer: Self-pay

## 2021-01-17 ENCOUNTER — Other Ambulatory Visit (HOSPITAL_COMMUNITY): Payer: Self-pay

## 2021-01-17 ENCOUNTER — Telehealth: Payer: Self-pay | Admitting: Internal Medicine

## 2021-01-17 ENCOUNTER — Other Ambulatory Visit: Payer: Self-pay | Admitting: Internal Medicine

## 2021-01-17 ENCOUNTER — Telehealth: Payer: Self-pay | Admitting: Family Medicine

## 2021-01-17 ENCOUNTER — Encounter (HOSPITAL_COMMUNITY)
Admission: RE | Admit: 2021-01-17 | Discharge: 2021-01-17 | Disposition: A | Discharge status: Home / Self Care | Payer: PPO | Source: Ambulatory Visit | Attending: Orthopedic Surgery | Admitting: Orthopedic Surgery

## 2021-01-17 VITALS — BP 130/67 | HR 95 | Temp 98.2°F | Resp 14 | Ht 64.0 in | Wt 172.2 lb

## 2021-01-17 DIAGNOSIS — M1712 Unilateral primary osteoarthritis, left knee: Secondary | ICD-10-CM | POA: Diagnosis not present

## 2021-01-17 DIAGNOSIS — Z7951 Long term (current) use of inhaled steroids: Secondary | ICD-10-CM | POA: Insufficient documentation

## 2021-01-17 DIAGNOSIS — K219 Gastro-esophageal reflux disease without esophagitis: Secondary | ICD-10-CM | POA: Diagnosis not present

## 2021-01-17 DIAGNOSIS — Z86010 Personal history of colonic polyps: Secondary | ICD-10-CM | POA: Insufficient documentation

## 2021-01-17 DIAGNOSIS — Z9884 Bariatric surgery status: Secondary | ICD-10-CM | POA: Diagnosis not present

## 2021-01-17 DIAGNOSIS — Z86718 Personal history of other venous thrombosis and embolism: Secondary | ICD-10-CM | POA: Diagnosis not present

## 2021-01-17 DIAGNOSIS — I480 Paroxysmal atrial fibrillation: Secondary | ICD-10-CM | POA: Insufficient documentation

## 2021-01-17 DIAGNOSIS — Z01812 Encounter for preprocedural laboratory examination: Secondary | ICD-10-CM | POA: Insufficient documentation

## 2021-01-17 DIAGNOSIS — Z7982 Long term (current) use of aspirin: Secondary | ICD-10-CM | POA: Diagnosis not present

## 2021-01-17 DIAGNOSIS — Z01818 Encounter for other preprocedural examination: Secondary | ICD-10-CM

## 2021-01-17 DIAGNOSIS — Z79899 Other long term (current) drug therapy: Secondary | ICD-10-CM | POA: Insufficient documentation

## 2021-01-17 DIAGNOSIS — I447 Left bundle-branch block, unspecified: Secondary | ICD-10-CM | POA: Diagnosis not present

## 2021-01-17 DIAGNOSIS — Z96651 Presence of right artificial knee joint: Secondary | ICD-10-CM | POA: Diagnosis not present

## 2021-01-17 DIAGNOSIS — I10 Essential (primary) hypertension: Secondary | ICD-10-CM | POA: Insufficient documentation

## 2021-01-17 LAB — COMPREHENSIVE METABOLIC PANEL
ALT: 17 U/L (ref 0–44)
AST: 16 U/L (ref 15–41)
Albumin: 3.8 g/dL (ref 3.5–5.0)
Alkaline Phosphatase: 52 U/L (ref 38–126)
Anion gap: 5 (ref 5–15)
BUN: 15 mg/dL (ref 8–23)
CO2: 28 mmol/L (ref 22–32)
Calcium: 9 mg/dL (ref 8.9–10.3)
Chloride: 105 mmol/L (ref 98–111)
Creatinine, Ser: 0.38 mg/dL — ABNORMAL LOW (ref 0.44–1.00)
GFR, Estimated: >60 mL/min (ref >60)
Glucose, Bld: 93 mg/dL (ref 70–99)
Potassium: 4.2 mmol/L (ref 3.5–5.1)
Sodium: 138 mmol/L (ref 135–145)
Total Bilirubin: 0.3 mg/dL (ref 0.3–1.2)
Total Protein: 6.7 g/dL (ref 6.5–8.1)

## 2021-01-17 LAB — CBC
HCT: 34.6 % — ABNORMAL LOW (ref 36.0–46.0)
Hemoglobin: 11 g/dL — ABNORMAL LOW (ref 12.0–15.0)
MCH: 31.1 pg (ref 26.0–34.0)
MCHC: 31.8 g/dL (ref 30.0–36.0)
MCV: 97.7 fL (ref 80.0–100.0)
Platelets: 251 10*3/uL (ref 150–400)
RBC: 3.54 MIL/uL — ABNORMAL LOW (ref 3.87–5.11)
RDW: 14 % (ref 11.5–15.5)
WBC: 7.3 10*3/uL (ref 4.0–10.5)
nRBC: 0 % (ref 0.0–0.2)

## 2021-01-17 LAB — SURGICAL PCR SCREEN
MRSA, PCR: NEGATIVE
Staphylococcus aureus: NEGATIVE

## 2021-01-17 MED ORDER — DIPHENOXYLATE-ATROPINE 2.5-0.025 MG PO TABS: 1.0000 | ORAL_TABLET | Freq: Three times a day (TID) | ORAL | 1 refills | Status: DC | PRN

## 2021-01-17 NOTE — Telephone Encounter (Signed)
Lomotil faxed to [facility] as requested

## 2021-01-17 NOTE — Telephone Encounter (Signed)
Inbound call from pt requesting a refill for Lomotil. Pt stated that she had to take 4 pills today because of her diarrhea. Pt would like prescription sent to [facility]. Please advise. Thank you.

## 2021-01-17 NOTE — Telephone Encounter (Signed)
Patient's daughter called to let us know that a surgical clearance was sent over to [place] by [facility] to clear patient for her Nov 1st appointment for a total knee surgery. She states that [facility] never received the form back and they need the clearance before Nov 1st.  Please advice.

## 2021-01-17 NOTE — Patient Instructions (Signed)
DUE TO COVID-19 ONLY ONE VISITOR IS ALLOWED TO COME WITH YOU AND STAY IN THE WAITING ROOM ONLY DURING PRE OP AND PROCEDURE.   **NO VISITORS ARE ALLOWED IN THE SHORT STAY AREA OR RECOVERY ROOM!!**  IF YOU WILL BE ADMITTED INTO THE HOSPITAL YOU ARE ALLOWED ONLY TWO SUPPORT PEOPLE DURING VISITATION HOURS ONLY (10AM -8PM)   The support person(s) may change daily. The support person(s) must pass our screening, gel in and out, and wear a mask at all times, including in the patient's room. Patients must also wear a mask when staff or their support person are in the room.  No visitors under the age of 44. Any visitor under the age of 23 must be accompanied by an adult.    COVID SWAB TESTING MUST BE COMPLETED ON:  01/21/21 **MUST PRESENT COMPLETED FORM AT TESTING SITE**    [facility] (backside of the building) No appointment needed. Open 8am- 3pm. You are not required to quarantine, however you are required to wear a well-fitted mask when you are out and around people not in your household.  Hand Hygiene often Do NOT share personal items Notify your provider if you are in close contact with someone who has COVID or you develop fever 100.4 or greater, new onset of sneezing, cough, sore throat, shortness of breath or body aches.       Your procedure is scheduled on: 01/25/21   Report to [facility] Main Entrance    Report to admitting at 7:20 AM   Call this number if you have problems the morning of surgery [phone]   Do not eat food :After Midnight.   May have liquids until 7:00 AM day of surgery  CLEAR LIQUID DIET  Foods Allowed                                                                     Foods Excluded  Water, Black Coffee and tea (no milk or creamer)           liquids that you cannot  Plain Jell-O in any flavor  (No red)                                    see through such as: Fruit ices (not with fruit pulp)                                             milk, soups, orange juice              Iced Popsicles (No red)                                             All solid food                                   Apple  juices Sports drinks like Gatorade (No red) Lightly seasoned clear broth or consume(fat free) Sugar     The day of surgery:  Drink ONE (1) Pre-Surgery Clear Ensure by 7:00 am the morning of surgery. Drink in one sitting. Do not sip.  This drink was given to you during your hospital  pre-op appointment visit. Nothing else to drink after completing the  Pre-Surgery Clear Ensure.          If you have questions, please contact your surgeon's office.     Oral Hygiene is also important to reduce your risk of infection.                                    Remember - BRUSH YOUR TEETH THE MORNING OF SURGERY WITH YOUR REGULAR TOOTHPASTE   Take these medicines the morning of surgery with A SIP OF WATER: Tylenol, Inhalers, Elavil, Zyrtec, Metoprolol, Omeprazole                              You may not have any metal on your body including hair pins, jewelry, and body piercing             Do not wear make-up, lotions, powders, perfumes, or deodorant  Do not wear nail polish including gel and S&S, artificial/acrylic nails, or any other type of covering on natural nails including finger and toenails. If you have artificial nails, gel coating, etc. that needs to be removed by a nail salon please have this removed prior to surgery or surgery may need to be canceled/ delayed if the surgeon/ anesthesia feels like they are unable to be safely monitored.   Do not shave  48 hours prior to surgery.    Do not bring valuables to the hospital. [place].   Contacts, dentures or bridgework may not be worn into surgery.   Bring small overnight bag day of surgery.  Special Instructions: Bring a copy of your healthcare power of attorney and living will documents         the day of surgery if you  haven't scanned them before.   Please read over the following fact sheets you were given: IF YOU HAVE QUESTIONS ABOUT YOUR PRE-OP INSTRUCTIONS PLEASE CALL [facility] - Preparing for Surgery Before surgery, you can play an important role.  Because skin is not sterile, your skin needs to be as free of germs as possible.  You can reduce the number of germs on your skin by washing with CHG (chlorahexidine gluconate) soap before surgery.  CHG is an antiseptic cleaner which kills germs and bonds with the skin to continue killing germs even after washing. Please DO NOT use if you have an allergy to CHG or antibacterial soaps.  If your skin becomes reddened/irritated stop using the CHG and inform your nurse when you arrive at Short Stay. Do not shave (including legs and underarms) for at least 48 hours prior to the first CHG shower.  You may shave your face/neck.  Please follow these instructions carefully:  1.  Shower with CHG Soap the night before surgery and the  morning of surgery.  2.  If you choose to wash your hair, wash your hair first as usual with  your normal  shampoo.  3.  After you shampoo, rinse your hair and body thoroughly to remove the shampoo.                             4.  Use CHG as you would any other liquid soap.  You can apply chg directly to the skin and wash.  Gently with a scrungie or clean washcloth.  5.  Apply the CHG Soap to your body ONLY FROM THE NECK DOWN.   Do   not use on face/ open                           Wound or open sores. Avoid contact with eyes, ears mouth and   genitals (private parts).                       Wash face,  Genitals (private parts) with your normal soap.             6.  Wash thoroughly, paying special attention to the area where your    surgery  will be performed.  7.  Thoroughly rinse your body with warm water from the neck down.  8.  DO NOT shower/wash with your normal soap after using and rinsing off the CHG Soap.                 9.  Pat yourself dry with a clean towel.            10.  Wear clean pajamas.            11.  Place clean sheets on your bed the night of your first shower and do not  sleep with pets. Day of Surgery : Do not apply any lotions/deodorants the morning of surgery.  Please wear clean clothes to the hospital/surgery center.  FAILURE TO FOLLOW THESE INSTRUCTIONS MAY RESULT IN THE CANCELLATION OF YOUR SURGERY  PATIENT SIGNATURE_________________________________  NURSE SIGNATURE__________________________________  ________________________________________________________________________   Alexis Olson  An incentive spirometer is a tool that can help keep your lungs clear and active. This tool measures how well you are filling your lungs with each breath. Taking long deep breaths may help reverse or decrease the chance of developing breathing (pulmonary) problems (especially infection) following: A long period of time when you are unable to move or be active. BEFORE THE PROCEDURE  If the spirometer includes an indicator to show your best effort, your nurse or respiratory therapist will set it to a desired goal. If possible, sit up straight or lean slightly forward. Try not to slouch. Hold the incentive spirometer in an upright position. INSTRUCTIONS FOR USE  Sit on the edge of your bed if possible, or sit up as far as you can in bed or on a chair. Hold the incentive spirometer in an upright position. Breathe out normally. Place the mouthpiece in your mouth and seal your lips tightly around it. Breathe in slowly and as deeply as possible, raising the piston or the ball toward the top of the column. Hold your breath for 3-5 seconds or for as long as possible. Allow the piston or ball to fall to the bottom of the column. Remove the mouthpiece from your mouth and breathe out normally. Rest for a few seconds and repeat Steps 1 through 7 at least 10 times every 1-2 hours when you are awake.  Take your time and take a few normal breaths between deep breaths. The spirometer may include an indicator to show your best effort. Use the indicator as a goal to work toward during each repetition. After each set of 10 deep breaths, practice coughing to be sure your lungs are clear. If you have an incision (the cut made at the time of surgery), support your incision when coughing by placing a pillow or rolled up towels firmly against it. Once you are able to get out of bed, walk around indoors and cough well. You may stop using the incentive spirometer when instructed by your caregiver.  RISKS AND COMPLICATIONS Take your time so you do not get dizzy or light-headed. If you are in pain, you may need to take or ask for pain medication before doing incentive spirometry. It is harder to take a deep breath if you are having pain. AFTER USE Rest and breathe slowly and easily. It can be helpful to keep track of a log of your progress. Your caregiver can provide you with a simple table to help with this. If you are using the spirometer at home, follow these instructions: [place] IF:  You are having difficultly using the spirometer. You have trouble using the spirometer as often as instructed. Your pain medication is not giving enough relief while using the spirometer. You develop fever of 100.5 F (38.1 C) or higher. SEEK IMMEDIATE MEDICAL CARE IF:  You cough up bloody sputum that had not been present before. You develop fever of 102 F (38.9 C) or greater. You develop worsening pain at or near the incision site. MAKE SURE YOU:  Understand these instructions. Will watch your condition. Will get help right away if you are not doing well or get worse. Document Released: 07/24/2006 Document Revised: 06/05/2011 Document Reviewed: 09/24/2006 ExitCare Patient Information 2014 Exit[employer], [place].   ________________________________________________________________________  WHAT IS A BLOOD  TRANSFUSION? Blood Transfusion Information  A transfusion is the replacement of blood or some of its parts. Blood is made up of multiple cells which provide different functions. Red blood cells carry oxygen and are used for blood loss replacement. White blood cells fight against infection. Platelets control bleeding. Plasma helps clot blood. Other blood products are available for specialized needs, such as hemophilia or other clotting disorders. BEFORE THE TRANSFUSION  Who gives blood for transfusions?  Healthy volunteers who are fully evaluated to make sure their blood is safe. This is blood bank blood. Transfusion therapy is the safest it has ever been in the practice of medicine. Before blood is taken from a donor, a complete history is taken to make sure that person has no history of diseases nor engages in risky social behavior (examples are intravenous drug use or sexual activity with multiple partners). The donor's travel history is screened to minimize risk of transmitting infections, such as malaria. The donated blood is tested for signs of infectious diseases, such as HIV and hepatitis. The blood is then tested to be sure it is compatible with you in order to minimize the chance of a transfusion reaction. If you or a relative donates blood, this is often done in anticipation of surgery and is not appropriate for emergency situations. It takes many days to process the donated blood. RISKS AND COMPLICATIONS Although transfusion therapy is very safe and saves many lives, the main dangers of transfusion include:  Getting an infectious disease. Developing a transfusion reaction. This is an allergic reaction to something in the blood  you were given. Every precaution is taken to prevent this. The decision to have a blood transfusion has been considered carefully by your caregiver before blood is given. Blood is not given unless the benefits outweigh the risks. AFTER THE TRANSFUSION Right after  receiving a blood transfusion, you will usually feel much better and more energetic. This is especially true if your red blood cells have gotten low (anemic). The transfusion raises the level of the red blood cells which carry oxygen, and this usually causes an energy increase. The nurse administering the transfusion will monitor you carefully for complications. HOME CARE INSTRUCTIONS  No special instructions are needed after a transfusion. You may find your energy is better. Speak with your caregiver about any limitations on activity for underlying diseases you may have. SEEK MEDICAL CARE IF:  Your condition is not improving after your transfusion. You develop redness or irritation at the intravenous (IV) site. SEEK IMMEDIATE MEDICAL CARE IF:  Any of the following symptoms occur over the next 12 hours: Shaking chills. You have a temperature by mouth above 102 F (38.9 C), not controlled by medicine. Chest, back, or muscle pain. People around you feel you are not acting correctly or are confused. Shortness of breath or difficulty breathing. Dizziness and fainting. You get a rash or develop hives. You have a decrease in urine output. Your urine turns a dark color or changes to pink, red, or brown. Any of the following symptoms occur over the next 10 days: You have a temperature by mouth above 102 F (38.9 C), not controlled by medicine. Shortness of breath. Weakness after normal activity. The white part of the eye turns yellow (jaundice). You have a decrease in the amount of urine or are urinating less often. Your urine turns a dark color or changes to pink, red, or brown. Document Released: 03/10/2000 Document Revised: 06/05/2011 Document Reviewed: 10/28/2007 [facility] Patient Information 2014 [place], [place].  _______________________________________________________________________

## 2021-01-18 ENCOUNTER — Other Ambulatory Visit (HOSPITAL_COMMUNITY): Payer: Self-pay

## 2021-01-18 NOTE — Progress Notes (Addendum)
Anesthesia Chart Review   Case: [id number] Date/Time: 01/25/21 0950   Procedure: TOTAL KNEE ARTHROPLASTY (Left: Knee)   Anesthesia type: Spinal   Pre-op diagnosis: Left knee osteoarthritis   Location: [facility] ROOM 09 / [facility]   Surgeons: Paralee Cancel, MD       DISCUSSION:87 y.o. never smoker with h/o gastric bypass surgery, GERD, HTN, paroxysmal atrial fibrillation (no anticoagulant due to fall risk, h/o SAH), LBBB, DVT, left knee OA scheduled for above procedure 01/25/21 with Dr. Paralee Cancel.   Pt last seen by cardiology 12/03/2020.  Echo 8/22 with reduced EF at 45-50%, previously 60-65%.  Toprol 25 mg started.   Clearance received from PCP which states pt is low risk for planned procedure.  VS: BP 130/67   Pulse 95   Temp 36.8 C (Oral)   Resp 14   Ht 5\' 4"  (1.626 m)   Wt 78.1 kg   SpO2 100%   BMI 29.56 kg/m   PROVIDERS: Mosie Lukes, MD is PCP   Dorris Carnes, MD is Cardiologist  LABS: Labs reviewed: Acceptable for surgery. (all labs ordered are listed, but only abnormal results are displayed)  Labs Reviewed  CBC - Abnormal; Notable for the following components:      Result Value   RBC 3.54 (*)    Hemoglobin 11.0 (*)    HCT 34.6 (*)    All other components within normal limits  COMPREHENSIVE METABOLIC PANEL - Abnormal; Notable for the following components:   Creatinine, Ser 0.38 (*)    All other components within normal limits  SURGICAL PCR SCREEN  TYPE AND SCREEN     IMAGES:   EKG: 07/12/2020 Rate 89 bpm  Sinus rhythm Atrial premature complex Sinus pause Left bundle branch block When compared with ECG of 07/11/2020, No significant change was found  CV: Echo 11/15/20   1. DIfficult acoustic windows make evaluation difficult. LVEF is  depressed with hypokinessis of the inferoseptum and Basal inferior  hypokinesis. Compared to echo from 2021, LVEF appears mildly worse. Left  ventricular ejection fraction, by estimation, is   45 to 50%. The left ventricle  has mildly decreased function. Left  ventricular diastolic parameters are consistent with Grade I diastolic  dysfunction (impaired relaxation). Elevated left atrial pressure.   2. Right ventricular systolic function is normal. The right ventricular  size is normal.   3. Trivial mitral valve regurgitation.   4. Tricuspid valve regurgitation is mild to moderate.   5. AV is thickened, calcified with mildly restricted motion Peak and mean  gradients through the valve are 17 and 10 mm Hg respectively consistent  with mild AS . The aortic valve is tricuspid. Aortic valve regurgitation  is not visualized.   6. The inferior vena cava is normal in size with greater than 50%  respiratory variability, suggesting right atrial pressure of 3 mmHg. 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 difficile diarrhea    Colon perforation ([place]) 08/2014   following polypetomy during colo, surgical repair   COLONIC POLYPS, ADENOMATOUS, HX OF 2010   DIVERTICULOSIS, COLON    DVT (deep venous thrombosis) ([place]) 2018   DVT (deep venous thrombosis) ([facility])    GERD (gastroesophageal reflux disease) 08/23/2015   Graded  compression stocking in place    History of blood transfusion    History of migraine    History of rheumatic fever    HSV (herpes simplex virus) infection 08/16/2015   Hyperlipidemia    Hypertension    Denies   Hyponatremia 05/02/2016   Incomplete left bundle branch block (LBBB) 2016   Noted on EKG   INSOMNIA    Lumbosacral disc disease    Chronic pain; History of osteomyelitis 2010 following ESI complication   Obesity    Osteomyelitis (HCC)    Pelvic fracture (HCC)    Pneumonia    Recurrent UTI 08/23/2015   URINARY URGENCY    Ventral hernia without obstruction or gangrene 05/22/2018   VITAMIN B12  DEFICIENCY     Past Surgical History:  Procedure Laterality Date   BACK SURGERY  1994   for ruptured disc   CHOLECYSTECTOMY     COLONOSCOPY     greenfield filter     ileojejunal bypass  1976   for obesity   LAPAROTOMY N/A 09/18/2014   Procedure: EXPLORATORY LAPAROTOMY WITH REPAIR OF CECIAL PERFORATION;  Surgeon: Autumn Messing III, MD;  Location: [facility];  Service: General;  Laterality: N/A;   TONSILLECTOMY     TOTAL KNEE ARTHROPLASTY Right 08/26/2019   Procedure: RIGHT TOTAL KNEE ARTHROPLASTY, LEFT KNEE CORTISONE INJECTION;  Surgeon: Paralee Cancel, MD;  Location: [facility];  Service: Orthopedics;  Laterality: Right;  61 MINS   UPPER GI ENDOSCOPY     VASCULAR SURGERY Right    revascularization right lower extremity    MEDICATIONS:  acetaminophen (TYLENOL) 500 MG tablet   albuterol (PROVENTIL HFA;VENTOLIN HFA) 108 (90 BASE) MCG/ACT inhaler   ALPRAZolam (XANAX) 0.5 MG tablet   amitriptyline (ELAVIL) 100 MG tablet   aspirin EC 81 MG tablet   azelastine (OPTIVAR) 0.05 % ophthalmic solution   benzonatate (TESSALON) 100 MG capsule   budesonide-formoterol (SYMBICORT) 160-4.5 MCG/ACT inhaler   cetirizine (ZYRTEC) 10 MG tablet   COVID-19 mRNA Vac-TriS, Pfizer, SUSP injection   cyanocobalamin (,VITAMIN B-12,) 1000 MCG/ML injection   dicyclomine (BENTYL) 10 MG capsule   diphenoxylate-atropine (LOMOTIL) 2.5-0.025 MG tablet   diphenoxylate-atropine (LOMOTIL) 2.5-0.025 MG tablet   Fe Fum-FA-B Cmp-C-Zn-Mg-Mn-Cu (HEMOCYTE PLUS) 106-1 MG CAPS   fluconazole (DIFLUCAN) 150 MG tablet   fluticasone (FLONASE) 50 MCG/ACT nasal spray   gabapentin (NEURONTIN) 300 MG capsule   hydrOXYzine (ATARAX) 25 MG tablet   levocetirizine (XYZAL) 5 MG tablet   magnesium oxide (MAG-OX) 400 MG tablet   metoprolol succinate (TOPROL XL) 25 MG 24 hr tablet   montelukast (SINGULAIR) 10 MG tablet   montelukast (SINGULAIR) 10 MG tablet   omeprazole (PRILOSEC) 20 MG capsule   ondansetron (ZOFRAN-ODT) 4 MG disintegrating tablet    phenazopyridine (PYRIDIUM) 100 MG tablet   potassium chloride SA (KLOR-CON) 20 MEQ tablet   Probiotic Product (PROBIOTIC DAILY PO)   valACYclovir (VALTREX) 500 MG tablet   Vitamin D, Ergocalciferol, (DRISDOL) 1.25 MG (50000 UNIT) CAPS capsule   zolpidem (AMBIEN) 5 MG tablet   No current facility-administered medications for this encounter.    Konrad Felix Ward, PA-C [facility] [phone]

## 2021-01-19 ENCOUNTER — Other Ambulatory Visit (HOSPITAL_COMMUNITY): Payer: Self-pay

## 2021-01-19 ENCOUNTER — Other Ambulatory Visit: Payer: Self-pay | Admitting: Internal Medicine

## 2021-01-19 MED ORDER — DICYCLOMINE HCL 10 MG PO CAPS
10.0000 mg | ORAL_CAPSULE | Freq: Three times a day (TID) | ORAL | 0 refills | Status: DC
  Filled 2021-01-19: qty 270, 90d supply, fill #0

## 2021-01-19 NOTE — Telephone Encounter (Signed)
Patient called to see if we ever received the surgical clearance. She was informed that no forms were received per CMA, patient stated she would call [facility] and let them know they need to fax it to [place] again. Fax number was given to patient to verify.

## 2021-01-20 ENCOUNTER — Other Ambulatory Visit (HOSPITAL_COMMUNITY): Payer: Self-pay

## 2021-01-20 ENCOUNTER — Other Ambulatory Visit: Payer: Self-pay | Admitting: Internal Medicine

## 2021-01-20 MED ORDER — HEMOCYTE PLUS 106-1 MG PO CAPS
1.0000 | ORAL_CAPSULE | Freq: Two times a day (BID) | ORAL | 1 refills | Status: DC
  Filled 2021-01-20: qty 60, 30d supply, fill #0
  Filled 2021-04-23: qty 60, 30d supply, fill #1

## 2021-01-20 NOTE — Telephone Encounter (Signed)
Form received today.  Patient notified that we will see if we can this done for her and we will her once complete.  Form in yellow folder for blyth to review and sign.

## 2021-01-21 ENCOUNTER — Other Ambulatory Visit (HOSPITAL_COMMUNITY): Payer: Self-pay

## 2021-01-21 ENCOUNTER — Other Ambulatory Visit: Payer: Self-pay | Admitting: Orthopedic Surgery

## 2021-01-21 NOTE — Telephone Encounter (Signed)
Surgical clearance form faxed over and patient notified.

## 2021-01-22 ENCOUNTER — Other Ambulatory Visit (HOSPITAL_COMMUNITY): Payer: Self-pay

## 2021-01-22 LAB — SARS CORONAVIRUS 2 (TAT 6-24 HRS): SARS Coronavirus 2: NEGATIVE

## 2021-01-24 ENCOUNTER — Other Ambulatory Visit (HOSPITAL_COMMUNITY): Payer: Self-pay

## 2021-01-25 ENCOUNTER — Ambulatory Visit (HOSPITAL_COMMUNITY): Payer: PPO | Admitting: Anesthesiology

## 2021-01-25 ENCOUNTER — Inpatient Hospital Stay (HOSPITAL_COMMUNITY)
Admission: RE | Admit: 2021-01-25 | Discharge: 2021-02-03 | DRG: 470 | Disposition: A | Discharge status: Home Health | Payer: PPO | Source: Ambulatory Visit | Attending: Orthopedic Surgery | Admitting: Orthopedic Surgery

## 2021-01-25 ENCOUNTER — Encounter (HOSPITAL_COMMUNITY): Payer: Self-pay | Admitting: Orthopedic Surgery

## 2021-01-25 ENCOUNTER — Other Ambulatory Visit: Payer: Self-pay

## 2021-01-25 ENCOUNTER — Ambulatory Visit (HOSPITAL_COMMUNITY): Payer: PPO | Admitting: Physician Assistant

## 2021-01-25 ENCOUNTER — Ambulatory Visit: Payer: PPO | Admitting: Family Medicine

## 2021-01-25 ENCOUNTER — Encounter (HOSPITAL_COMMUNITY)
Admission: RE | Disposition: A | Discharge status: Home Health | Payer: Self-pay | Source: Ambulatory Visit | Attending: Orthopedic Surgery

## 2021-01-25 DIAGNOSIS — J4530 Mild persistent asthma, uncomplicated: Secondary | ICD-10-CM | POA: Diagnosis present

## 2021-01-25 DIAGNOSIS — M810 Age-related osteoporosis without current pathological fracture: Secondary | ICD-10-CM | POA: Diagnosis present

## 2021-01-25 DIAGNOSIS — I959 Hypotension, unspecified: Secondary | ICD-10-CM | POA: Diagnosis not present

## 2021-01-25 DIAGNOSIS — E538 Deficiency of other specified B group vitamins: Secondary | ICD-10-CM | POA: Diagnosis present

## 2021-01-25 DIAGNOSIS — Z9884 Bariatric surgery status: Secondary | ICD-10-CM

## 2021-01-25 DIAGNOSIS — Z20822 Contact with and (suspected) exposure to covid-19: Secondary | ICD-10-CM | POA: Diagnosis present

## 2021-01-25 DIAGNOSIS — M1712 Unilateral primary osteoarthritis, left knee: Secondary | ICD-10-CM | POA: Diagnosis present

## 2021-01-25 DIAGNOSIS — G8918 Other acute postprocedural pain: Secondary | ICD-10-CM | POA: Diagnosis not present

## 2021-01-25 DIAGNOSIS — I4891 Unspecified atrial fibrillation: Secondary | ICD-10-CM | POA: Diagnosis present

## 2021-01-25 DIAGNOSIS — M25762 Osteophyte, left knee: Secondary | ICD-10-CM | POA: Diagnosis not present

## 2021-01-25 DIAGNOSIS — I10 Essential (primary) hypertension: Secondary | ICD-10-CM | POA: Diagnosis present

## 2021-01-25 DIAGNOSIS — Z79899 Other long term (current) drug therapy: Secondary | ICD-10-CM

## 2021-01-25 DIAGNOSIS — E669 Obesity, unspecified: Secondary | ICD-10-CM | POA: Diagnosis present

## 2021-01-25 DIAGNOSIS — M25462 Effusion, left knee: Secondary | ICD-10-CM | POA: Diagnosis not present

## 2021-01-25 DIAGNOSIS — Z96652 Presence of left artificial knee joint: Secondary | ICD-10-CM

## 2021-01-25 DIAGNOSIS — D62 Acute posthemorrhagic anemia: Secondary | ICD-10-CM | POA: Diagnosis not present

## 2021-01-25 DIAGNOSIS — Z6830 Body mass index (BMI) 30.0-30.9, adult: Secondary | ICD-10-CM

## 2021-01-25 DIAGNOSIS — J9811 Atelectasis: Secondary | ICD-10-CM | POA: Diagnosis not present

## 2021-01-25 DIAGNOSIS — Z823 Family history of stroke: Secondary | ICD-10-CM

## 2021-01-25 DIAGNOSIS — Z8049 Family history of malignant neoplasm of other genital organs: Secondary | ICD-10-CM | POA: Diagnosis not present

## 2021-01-25 DIAGNOSIS — M65862 Other synovitis and tenosynovitis, left lower leg: Secondary | ICD-10-CM | POA: Diagnosis present

## 2021-01-25 DIAGNOSIS — F411 Generalized anxiety disorder: Secondary | ICD-10-CM | POA: Diagnosis present

## 2021-01-25 DIAGNOSIS — Z86718 Personal history of other venous thrombosis and embolism: Secondary | ICD-10-CM

## 2021-01-25 DIAGNOSIS — E785 Hyperlipidemia, unspecified: Secondary | ICD-10-CM | POA: Diagnosis present

## 2021-01-25 DIAGNOSIS — K219 Gastro-esophageal reflux disease without esophagitis: Secondary | ICD-10-CM | POA: Diagnosis present

## 2021-01-25 DIAGNOSIS — R0602 Shortness of breath: Secondary | ICD-10-CM | POA: Diagnosis not present

## 2021-01-25 HISTORY — PX: TOTAL KNEE ARTHROPLASTY: SHX125

## 2021-01-25 LAB — TYPE AND SCREEN
ABO/RH(D): A NEG
Antibody Screen: NEGATIVE

## 2021-01-25 SURGERY — ARTHROPLASTY, KNEE, TOTAL
Anesthesia: Regional | Site: Knee | Laterality: Left

## 2021-01-25 MED ORDER — HYDROMORPHONE HCL 1 MG/ML IJ SOLN
0.2500 mg | INTRAMUSCULAR | Status: DC | PRN
  Administered 2021-01-25 (×2): 0.5 mg via INTRAVENOUS

## 2021-01-25 MED ORDER — CEFAZOLIN SODIUM-DEXTROSE 2-4 GM/100ML-% IV SOLN
2.0000 g | INTRAVENOUS | Status: AC
  Administered 2021-01-25: 2 g via INTRAVENOUS
  Filled 2021-01-25: qty 100

## 2021-01-25 MED ORDER — VALACYCLOVIR HCL 500 MG PO TABS
500.0000 mg | ORAL_TABLET | Freq: Every day | ORAL | Status: DC | PRN
  Filled 2021-01-25: qty 1

## 2021-01-25 MED ORDER — CHLORHEXIDINE GLUCONATE 0.12 % MT SOLN: 15.0000 mL | Freq: Once | OROMUCOSAL | Status: AC

## 2021-01-25 MED ORDER — TRANEXAMIC ACID-NACL 1000-0.7 MG/100ML-% IV SOLN
1000.0000 mg | Freq: Once | INTRAVENOUS | Status: AC
  Administered 2021-01-25: 1000 mg via INTRAVENOUS
  Filled 2021-01-25: qty 100

## 2021-01-25 MED ORDER — ACETAMINOPHEN 10 MG/ML IV SOLN
1000.0000 mg | Freq: Once | INTRAVENOUS | Status: AC
  Administered 2021-01-25: 1000 mg via INTRAVENOUS

## 2021-01-25 MED ORDER — PROPOFOL 10 MG/ML IV BOLUS
INTRAVENOUS | Status: DC | PRN
  Administered 2021-01-25 (×2): 50 mg via INTRAVENOUS
  Administered 2021-01-25: 20 mg via INTRAVENOUS

## 2021-01-25 MED ORDER — DEXAMETHASONE SODIUM PHOSPHATE 10 MG/ML IJ SOLN
INTRAMUSCULAR | Status: DC | PRN
  Administered 2021-01-25: 5 mg via INTRAVENOUS

## 2021-01-25 MED ORDER — KETAMINE HCL 10 MG/ML IJ SOLN
INTRAMUSCULAR | Status: DC | PRN
  Administered 2021-01-25: 35 mg via INTRAVENOUS

## 2021-01-25 MED ORDER — ONDANSETRON HCL 4 MG/2ML IJ SOLN
INTRAMUSCULAR | Status: DC | PRN
  Administered 2021-01-25: 4 mg via INTRAVENOUS

## 2021-01-25 MED ORDER — METHOCARBAMOL 500 MG PO TABS
500.0000 mg | ORAL_TABLET | Freq: Four times a day (QID) | ORAL | Status: DC | PRN
  Administered 2021-01-25 – 2021-02-02 (×11): 500 mg via ORAL
  Filled 2021-01-25 (×11): qty 1

## 2021-01-25 MED ORDER — SODIUM CHLORIDE 0.9 % IV SOLN: INTRAVENOUS | Status: DC

## 2021-01-25 MED ORDER — GABAPENTIN 300 MG PO CAPS
300.0000 mg | ORAL_CAPSULE | Freq: Every day | ORAL | Status: DC
  Administered 2021-01-25 – 2021-02-02 (×9): 300 mg via ORAL
  Filled 2021-01-25 (×9): qty 1

## 2021-01-25 MED ORDER — FENTANYL CITRATE PF 50 MCG/ML IJ SOSY
50.0000 ug | PREFILLED_SYRINGE | INTRAMUSCULAR | Status: DC
  Administered 2021-01-25 (×2): 50 ug via INTRAVENOUS
  Filled 2021-01-25: qty 2

## 2021-01-25 MED ORDER — ONDANSETRON HCL 4 MG/2ML IJ SOLN
4.0000 mg | Freq: Four times a day (QID) | INTRAMUSCULAR | Status: DC | PRN
  Administered 2021-01-26: 4 mg via INTRAVENOUS
  Filled 2021-01-25: qty 2

## 2021-01-25 MED ORDER — STERILE WATER FOR IRRIGATION IR SOLN
Status: DC | PRN
  Administered 2021-01-25: 2000 mL

## 2021-01-25 MED ORDER — DICYCLOMINE HCL 10 MG PO CAPS
10.0000 mg | ORAL_CAPSULE | Freq: Three times a day (TID) | ORAL | Status: DC
  Administered 2021-01-25 – 2021-02-03 (×27): 10 mg via ORAL
  Filled 2021-01-25 (×30): qty 1

## 2021-01-25 MED ORDER — PHENYLEPHRINE 40 MCG/ML (10ML) SYRINGE FOR IV PUSH (FOR BLOOD PRESSURE SUPPORT)
PREFILLED_SYRINGE | INTRAVENOUS | Status: AC
  Filled 2021-01-25: qty 20

## 2021-01-25 MED ORDER — LACTATED RINGERS IV SOLN: INTRAVENOUS | Status: DC

## 2021-01-25 MED ORDER — PHENOL 1.4 % MT LIQD: 1.0000 | OROMUCOSAL | Status: DC | PRN

## 2021-01-25 MED ORDER — FENTANYL CITRATE (PF) 100 MCG/2ML IJ SOLN
INTRAMUSCULAR | Status: AC
  Filled 2021-01-25: qty 2

## 2021-01-25 MED ORDER — MENTHOL 3 MG MT LOZG: 1.0000 | LOZENGE | OROMUCOSAL | Status: DC | PRN

## 2021-01-25 MED ORDER — PHENYLEPHRINE HCL (PRESSORS) 10 MG/ML IV SOLN
INTRAVENOUS | Status: AC
  Filled 2021-01-25: qty 1

## 2021-01-25 MED ORDER — KETOROLAC TROMETHAMINE 30 MG/ML IJ SOLN
INTRAMUSCULAR | Status: DC | PRN
  Administered 2021-01-25: 30 mg

## 2021-01-25 MED ORDER — DEXAMETHASONE SODIUM PHOSPHATE 10 MG/ML IJ SOLN
10.0000 mg | Freq: Once | INTRAMUSCULAR | Status: AC
  Administered 2021-01-26: 10 mg via INTRAVENOUS
  Filled 2021-01-25: qty 1

## 2021-01-25 MED ORDER — FE FUMARATE-B12-VIT C-FA-IFC PO CAPS
1.0000 | ORAL_CAPSULE | Freq: Two times a day (BID) | ORAL | Status: DC
  Administered 2021-01-25 – 2021-02-03 (×18): 1 via ORAL
  Filled 2021-01-25 (×19): qty 1

## 2021-01-25 MED ORDER — DEXAMETHASONE SODIUM PHOSPHATE 10 MG/ML IJ SOLN
INTRAMUSCULAR | Status: AC
  Filled 2021-01-25: qty 1

## 2021-01-25 MED ORDER — TAMSULOSIN HCL 0.4 MG PO CAPS
0.4000 mg | ORAL_CAPSULE | Freq: Every day | ORAL | Status: DC
  Administered 2021-01-25: 0.4 mg via ORAL
  Filled 2021-01-25: qty 1

## 2021-01-25 MED ORDER — METOPROLOL SUCCINATE ER 25 MG PO TB24
25.0000 mg | ORAL_TABLET | Freq: Every day | ORAL | Status: DC
  Administered 2021-01-26 – 2021-02-03 (×8): 25 mg via ORAL
  Filled 2021-01-25 (×8): qty 1

## 2021-01-25 MED ORDER — PANTOPRAZOLE SODIUM 40 MG PO TBEC
40.0000 mg | DELAYED_RELEASE_TABLET | Freq: Every day | ORAL | Status: DC
  Administered 2021-01-25 – 2021-02-03 (×10): 40 mg via ORAL
  Filled 2021-01-25 (×10): qty 1

## 2021-01-25 MED ORDER — METHOCARBAMOL 500 MG IVPB - SIMPLE MED
500.0000 mg | Freq: Four times a day (QID) | INTRAVENOUS | Status: DC | PRN
  Administered 2021-01-25: 500 mg via INTRAVENOUS
  Filled 2021-01-25: qty 50

## 2021-01-25 MED ORDER — MAGNESIUM OXIDE -MG SUPPLEMENT 400 (240 MG) MG PO TABS
400.0000 mg | ORAL_TABLET | Freq: Two times a day (BID) | ORAL | Status: DC
  Administered 2021-01-25 – 2021-02-03 (×18): 400 mg via ORAL
  Filled 2021-01-25 (×18): qty 1

## 2021-01-25 MED ORDER — HEMOCYTE PLUS 106-1 MG PO CAPS: 1.0000 | ORAL_CAPSULE | Freq: Two times a day (BID) | ORAL | Status: DC

## 2021-01-25 MED ORDER — OXYCODONE HCL 5 MG PO TABS
5.0000 mg | ORAL_TABLET | ORAL | Status: DC | PRN
  Administered 2021-01-26: 5 mg via ORAL
  Administered 2021-01-26 – 2021-01-28 (×4): 10 mg via ORAL
  Administered 2021-01-28: 5 mg via ORAL
  Administered 2021-01-29 (×2): 10 mg via ORAL
  Administered 2021-01-30 – 2021-02-03 (×11): 5 mg via ORAL
  Filled 2021-01-25: qty 1
  Filled 2021-01-25 (×2): qty 2
  Filled 2021-01-25: qty 1
  Filled 2021-01-25 (×4): qty 2
  Filled 2021-01-25 (×4): qty 1
  Filled 2021-01-25: qty 2
  Filled 2021-01-25 (×2): qty 1
  Filled 2021-01-25 (×2): qty 2
  Filled 2021-01-25 (×4): qty 1
  Filled 2021-01-25 (×2): qty 2

## 2021-01-25 MED ORDER — MOMETASONE FURO-FORMOTEROL FUM 200-5 MCG/ACT IN AERO
2.0000 | INHALATION_SPRAY | Freq: Two times a day (BID) | RESPIRATORY_TRACT | Status: DC
  Administered 2021-01-25 – 2021-02-03 (×18): 2 via RESPIRATORY_TRACT
  Filled 2021-01-25: qty 8.8

## 2021-01-25 MED ORDER — ACETAMINOPHEN 10 MG/ML IV SOLN
INTRAVENOUS | Status: AC
  Filled 2021-01-25: qty 100

## 2021-01-25 MED ORDER — DOCUSATE SODIUM 100 MG PO CAPS
100.0000 mg | ORAL_CAPSULE | Freq: Two times a day (BID) | ORAL | Status: DC
  Administered 2021-01-25 – 2021-02-03 (×16): 100 mg via ORAL
  Filled 2021-01-25 (×18): qty 1

## 2021-01-25 MED ORDER — POVIDONE-IODINE 10 % EX SWAB
2.0000 "application " | Freq: Once | CUTANEOUS | Status: AC
  Administered 2021-01-25: 2 via TOPICAL

## 2021-01-25 MED ORDER — CEFAZOLIN SODIUM-DEXTROSE 2-4 GM/100ML-% IV SOLN
2.0000 g | Freq: Four times a day (QID) | INTRAVENOUS | Status: AC
  Administered 2021-01-25 (×2): 2 g via INTRAVENOUS
  Filled 2021-01-25 (×2): qty 100

## 2021-01-25 MED ORDER — BUPIVACAINE-EPINEPHRINE (PF) 0.25% -1:200000 IJ SOLN
INTRAMUSCULAR | Status: DC | PRN
  Administered 2021-01-25: 30 mL

## 2021-01-25 MED ORDER — LORATADINE 10 MG PO TABS
10.0000 mg | ORAL_TABLET | Freq: Every day | ORAL | Status: DC
  Administered 2021-01-26 – 2021-02-03 (×9): 10 mg via ORAL
  Filled 2021-01-25 (×9): qty 1

## 2021-01-25 MED ORDER — PHENYLEPHRINE HCL-NACL 20-0.9 MG/250ML-% IV SOLN
INTRAVENOUS | Status: DC | PRN
  Administered 2021-01-25: 50 ug/min via INTRAVENOUS

## 2021-01-25 MED ORDER — ACETAMINOPHEN 325 MG PO TABS
325.0000 mg | ORAL_TABLET | Freq: Four times a day (QID) | ORAL | Status: DC | PRN
  Administered 2021-01-26 – 2021-02-02 (×3): 650 mg via ORAL
  Filled 2021-01-25 (×3): qty 2

## 2021-01-25 MED ORDER — KETAMINE HCL 10 MG/ML IJ SOLN
INTRAMUSCULAR | Status: AC
  Filled 2021-01-25: qty 1

## 2021-01-25 MED ORDER — POTASSIUM CHLORIDE CRYS ER 20 MEQ PO TBCR
20.0000 meq | EXTENDED_RELEASE_TABLET | Freq: Every day | ORAL | Status: DC
  Administered 2021-01-25 – 2021-02-03 (×10): 20 meq via ORAL
  Filled 2021-01-25 (×10): qty 1

## 2021-01-25 MED ORDER — ZOLPIDEM TARTRATE 5 MG PO TABS
5.0000 mg | ORAL_TABLET | Freq: Every day | ORAL | Status: DC
  Administered 2021-01-25 – 2021-02-02 (×8): 5 mg via ORAL
  Filled 2021-01-25 (×9): qty 1

## 2021-01-25 MED ORDER — POLYETHYLENE GLYCOL 3350 17 G PO PACK
17.0000 g | PACK | Freq: Every day | ORAL | Status: DC | PRN
  Administered 2021-01-27 – 2021-01-30 (×3): 17 g via ORAL
  Filled 2021-01-25 (×3): qty 1

## 2021-01-25 MED ORDER — HYDROMORPHONE HCL 1 MG/ML IJ SOLN
0.5000 mg | INTRAMUSCULAR | Status: DC | PRN
  Administered 2021-01-26 – 2021-01-27 (×2): 1 mg via INTRAVENOUS
  Filled 2021-01-25 (×2): qty 1

## 2021-01-25 MED ORDER — BUPIVACAINE-EPINEPHRINE (PF) 0.25% -1:200000 IJ SOLN
INTRAMUSCULAR | Status: AC
  Filled 2021-01-25: qty 30

## 2021-01-25 MED ORDER — HYDROXYZINE HCL 25 MG PO TABS
25.0000 mg | ORAL_TABLET | Freq: Three times a day (TID) | ORAL | Status: DC | PRN
  Administered 2021-01-27: 25 mg via ORAL
  Filled 2021-01-25: qty 1

## 2021-01-25 MED ORDER — OXYCODONE HCL 5 MG PO TABS
10.0000 mg | ORAL_TABLET | ORAL | Status: DC | PRN
  Administered 2021-01-25 – 2021-01-27 (×4): 10 mg via ORAL
  Filled 2021-01-25 (×2): qty 2

## 2021-01-25 MED ORDER — AMITRIPTYLINE HCL 50 MG PO TABS
100.0000 mg | ORAL_TABLET | Freq: Every day | ORAL | Status: DC
  Administered 2021-01-25 – 2021-02-02 (×9): 100 mg via ORAL
  Filled 2021-01-25 (×11): qty 2
  Filled 2021-01-25: qty 1

## 2021-01-25 MED ORDER — FLUTICASONE PROPIONATE 50 MCG/ACT NA SUSP
2.0000 | Freq: Every day | NASAL | Status: DC
  Administered 2021-01-25 – 2021-02-03 (×10): 2 via NASAL
  Filled 2021-01-25: qty 16

## 2021-01-25 MED ORDER — SODIUM CHLORIDE 0.9 % IR SOLN
Status: DC | PRN
  Administered 2021-01-25: 1000 mL

## 2021-01-25 MED ORDER — PHENAZOPYRIDINE HCL 100 MG PO TABS
100.0000 mg | ORAL_TABLET | Freq: Three times a day (TID) | ORAL | Status: DC | PRN
  Filled 2021-01-25: qty 1

## 2021-01-25 MED ORDER — METHOCARBAMOL 500 MG IVPB - SIMPLE MED
INTRAVENOUS | Status: AC
  Filled 2021-01-25: qty 50

## 2021-01-25 MED ORDER — BENZONATATE 100 MG PO CAPS
100.0000 mg | ORAL_CAPSULE | Freq: Three times a day (TID) | ORAL | Status: DC | PRN
  Administered 2021-01-28 – 2021-02-02 (×4): 100 mg via ORAL
  Filled 2021-01-25 (×5): qty 1

## 2021-01-25 MED ORDER — METOCLOPRAMIDE HCL 5 MG/ML IJ SOLN: 5.0000 mg | Freq: Three times a day (TID) | INTRAMUSCULAR | Status: DC | PRN

## 2021-01-25 MED ORDER — MONTELUKAST SODIUM 10 MG PO TABS
10.0000 mg | ORAL_TABLET | Freq: Every day | ORAL | Status: DC
  Administered 2021-01-25 – 2021-02-02 (×9): 10 mg via ORAL
  Filled 2021-01-25 (×9): qty 1

## 2021-01-25 MED ORDER — ALPRAZOLAM 0.5 MG PO TABS
0.5000 mg | ORAL_TABLET | Freq: Every evening | ORAL | Status: DC | PRN
  Administered 2021-01-25 – 2021-02-02 (×8): 0.5 mg via ORAL
  Filled 2021-01-25 (×8): qty 1

## 2021-01-25 MED ORDER — KETOTIFEN FUMARATE 0.025 % OP SOLN
1.0000 [drp] | Freq: Every day | OPHTHALMIC | Status: DC
  Administered 2021-01-27 – 2021-02-03 (×8): 1 [drp] via OPHTHALMIC
  Filled 2021-01-25: qty 5

## 2021-01-25 MED ORDER — METOCLOPRAMIDE HCL 5 MG PO TABS: 5.0000 mg | ORAL_TABLET | Freq: Three times a day (TID) | ORAL | Status: DC | PRN

## 2021-01-25 MED ORDER — BUPIVACAINE-EPINEPHRINE (PF) 0.5% -1:200000 IJ SOLN
INTRAMUSCULAR | Status: DC | PRN
  Administered 2021-01-25: 20 mL via PERINEURAL

## 2021-01-25 MED ORDER — SODIUM CHLORIDE (PF) 0.9 % IJ SOLN
INTRAMUSCULAR | Status: DC | PRN
  Administered 2021-01-25: 30 mL

## 2021-01-25 MED ORDER — ONDANSETRON HCL 4 MG PO TABS: 4.0000 mg | ORAL_TABLET | Freq: Four times a day (QID) | ORAL | Status: DC | PRN

## 2021-01-25 MED ORDER — SODIUM CHLORIDE (PF) 0.9 % IJ SOLN
INTRAMUSCULAR | Status: AC
  Filled 2021-01-25: qty 30

## 2021-01-25 MED ORDER — BISACODYL 10 MG RE SUPP
10.0000 mg | Freq: Every day | RECTAL | Status: DC | PRN
  Administered 2021-01-30: 10 mg via RECTAL
  Filled 2021-01-25: qty 1

## 2021-01-25 MED ORDER — DIPHENOXYLATE-ATROPINE 2.5-0.025 MG PO TABS
1.0000 | ORAL_TABLET | Freq: Three times a day (TID) | ORAL | Status: DC | PRN
  Administered 2021-02-02: 1 via ORAL
  Filled 2021-01-25: qty 1

## 2021-01-25 MED ORDER — TRANEXAMIC ACID-NACL 1000-0.7 MG/100ML-% IV SOLN
1000.0000 mg | INTRAVENOUS | Status: AC
  Administered 2021-01-25: 1000 mg via INTRAVENOUS
  Filled 2021-01-25: qty 100

## 2021-01-25 MED ORDER — KETOROLAC TROMETHAMINE 15 MG/ML IJ SOLN
7.5000 mg | Freq: Four times a day (QID) | INTRAMUSCULAR | Status: AC
  Administered 2021-01-25 – 2021-01-26 (×4): 7.5 mg via INTRAVENOUS
  Filled 2021-01-25 (×4): qty 1

## 2021-01-25 MED ORDER — ONDANSETRON HCL 4 MG/2ML IJ SOLN
INTRAMUSCULAR | Status: AC
  Filled 2021-01-25: qty 2

## 2021-01-25 MED ORDER — KETOROLAC TROMETHAMINE 30 MG/ML IJ SOLN
INTRAMUSCULAR | Status: AC
  Filled 2021-01-25: qty 1

## 2021-01-25 MED ORDER — DEXAMETHASONE SODIUM PHOSPHATE 10 MG/ML IJ SOLN: 8.0000 mg | Freq: Once | INTRAMUSCULAR | Status: DC

## 2021-01-25 MED ORDER — APIXABAN 2.5 MG PO TABS
2.5000 mg | ORAL_TABLET | Freq: Two times a day (BID) | ORAL | Status: DC
  Administered 2021-01-26 – 2021-02-03 (×17): 2.5 mg via ORAL
  Filled 2021-01-25 (×17): qty 1

## 2021-01-25 MED ORDER — ORAL CARE MOUTH RINSE
15.0000 mL | Freq: Once | OROMUCOSAL | Status: AC
  Administered 2021-01-25: 15 mL via OROMUCOSAL

## 2021-01-25 MED ORDER — PROPOFOL 10 MG/ML IV BOLUS
INTRAVENOUS | Status: AC
  Filled 2021-01-25: qty 20

## 2021-01-25 MED ORDER — HYDROMORPHONE HCL 1 MG/ML IJ SOLN
INTRAMUSCULAR | Status: AC
  Filled 2021-01-25: qty 2

## 2021-01-25 MED ORDER — FENTANYL CITRATE (PF) 100 MCG/2ML IJ SOLN
INTRAMUSCULAR | Status: DC | PRN
  Administered 2021-01-25 (×2): 25 ug via INTRAVENOUS

## 2021-01-25 MED ORDER — ALBUTEROL SULFATE (2.5 MG/3ML) 0.083% IN NEBU
2.5000 mg | INHALATION_SOLUTION | Freq: Four times a day (QID) | RESPIRATORY_TRACT | Status: DC | PRN
  Administered 2021-01-30: 2.5 mg via RESPIRATORY_TRACT
  Filled 2021-01-25: qty 3

## 2021-01-25 MED ORDER — PHENYLEPHRINE 40 MCG/ML (10ML) SYRINGE FOR IV PUSH (FOR BLOOD PRESSURE SUPPORT)
PREFILLED_SYRINGE | INTRAVENOUS | Status: DC | PRN
  Administered 2021-01-25 (×2): 80 ug via INTRAVENOUS
  Administered 2021-01-25 (×3): 120 ug via INTRAVENOUS
  Administered 2021-01-25 (×2): 80 ug via INTRAVENOUS

## 2021-01-25 SURGICAL SUPPLY — 50 items
ATTUNE MED ANAT PAT 35 KNEE (Knees) ×2 IMPLANT
ATTUNE PSFEM LTSZ4 NARCEM KNEE (Femur) ×2 IMPLANT
ATTUNE PSRP INSR SZ4 7 KNEE (Insert) ×2 IMPLANT
BAG COUNTER SPONGE SURGICOUNT (BAG) IMPLANT
BAG ZIPLOCK 12X15 (MISCELLANEOUS) IMPLANT
BASEPLATE TIBIAL ROTATING SZ 4 (Knees) ×2 IMPLANT
BLADE SAW SGTL 11.0X1.19X90.0M (BLADE) IMPLANT
BLADE SAW SGTL 13.0X1.19X90.0M (BLADE) ×2 IMPLANT
BLADE SURG SZ10 CARB STEEL (BLADE) ×4 IMPLANT
BNDG ELASTIC 6X5.8 VLCR STR LF (GAUZE/BANDAGES/DRESSINGS) ×2 IMPLANT
BOWL SMART MIX CTS (DISPOSABLE) ×2 IMPLANT
CEMENT HV SMART SET (Cement) ×4 IMPLANT
CUFF TOURN SGL QUICK 34 (TOURNIQUET CUFF) ×2
CUFF TRNQT CYL 34X4.125X (TOURNIQUET CUFF) ×1 IMPLANT
DECANTER SPIKE VIAL GLASS SM (MISCELLANEOUS) ×4 IMPLANT
DERMABOND ADVANCED (GAUZE/BANDAGES/DRESSINGS) ×1
DERMABOND ADVANCED .7 DNX12 (GAUZE/BANDAGES/DRESSINGS) ×1 IMPLANT
DRAPE INCISE IOBAN 66X45 STRL (DRAPES) ×2 IMPLANT
DRAPE U-SHAPE 47X51 STRL (DRAPES) ×2 IMPLANT
DRESSING AQUACEL AG SP 3.5X10 (GAUZE/BANDAGES/DRESSINGS) ×1 IMPLANT
DRSG AQUACEL AG ADV 3.5X10 (GAUZE/BANDAGES/DRESSINGS) ×2 IMPLANT
DRSG AQUACEL AG SP 3.5X10 (GAUZE/BANDAGES/DRESSINGS) ×2
DURAPREP 26ML APPLICATOR (WOUND CARE) ×4 IMPLANT
ELECT REM PT RETURN 15FT ADLT (MISCELLANEOUS) ×2 IMPLANT
GLOVE SURG ENC MOIS LTX SZ6 (GLOVE) ×2 IMPLANT
GLOVE SURG ENC MOIS LTX SZ7 (GLOVE) ×2 IMPLANT
GLOVE SURG POLY ORTHO LF SZ6.5 (GLOVE) ×2 IMPLANT
GLOVE SURG UNDER POLY LF SZ7.5 (GLOVE) ×2 IMPLANT
GOWN STRL REUS W/TWL LRG LVL3 (GOWN DISPOSABLE) ×2 IMPLANT
HANDPIECE INTERPULSE COAX TIP (DISPOSABLE) ×2
HOLDER FOLEY CATH W/STRAP (MISCELLANEOUS) IMPLANT
KIT TURNOVER KIT A (KITS) IMPLANT
MANIFOLD NEPTUNE II (INSTRUMENTS) ×2 IMPLANT
NDL SAFETY ECLIPSE 18X1.5 (NEEDLE) IMPLANT
NEEDLE HYPO 18GX1.5 SHARP (NEEDLE)
NS IRRIG 1000ML POUR BTL (IV SOLUTION) ×2 IMPLANT
PACK TOTAL KNEE CUSTOM (KITS) ×2 IMPLANT
PROTECTOR NERVE ULNAR (MISCELLANEOUS) ×2 IMPLANT
SET HNDPC FAN SPRY TIP SCT (DISPOSABLE) ×1 IMPLANT
SET PAD KNEE POSITIONER (MISCELLANEOUS) ×2 IMPLANT
SUT MNCRL AB 4-0 PS2 18 (SUTURE) ×2 IMPLANT
SUT STRATAFIX PDS+ 0 24IN (SUTURE) ×2 IMPLANT
SUT VIC AB 1 CT1 36 (SUTURE) ×2 IMPLANT
SUT VIC AB 2-0 CT1 27 (SUTURE) ×6
SUT VIC AB 2-0 CT1 TAPERPNT 27 (SUTURE) ×3 IMPLANT
SYR 3ML LL SCALE MARK (SYRINGE) ×2 IMPLANT
TRAY FOLEY MTR SLVR 16FR STAT (SET/KITS/TRAYS/PACK) ×2 IMPLANT
TUBE SUCTION HIGH CAP CLEAR NV (SUCTIONS) ×2 IMPLANT
WATER STERILE IRR 1000ML POUR (IV SOLUTION) ×4 IMPLANT
WRAP KNEE MAXI GEL POST OP (GAUZE/BANDAGES/DRESSINGS) ×2 IMPLANT

## 2021-01-25 NOTE — Interval H&P Note (Signed)
History and Physical Interval Note:  01/25/2021 9:13 AM  Alexis Olson  has presented today for surgery, with the diagnosis of Left knee osteoarthritis.  The various methods of treatment have been discussed with the patient and family. After consideration of risks, benefits and other options for treatment, the patient has consented to  Procedure(s): TOTAL KNEE ARTHROPLASTY (Left) as a surgical intervention.  The patient's history has been reviewed, patient examined, no change in status, stable for surgery.  I have reviewed the patient's chart and labs.  Questions were answered to the patient's satisfaction.     Mauri Pole

## 2021-01-25 NOTE — H&P (Signed)
TOTAL KNEE ADMISSION H&P  Patient is being admitted for left total knee arthroplasty.  Subjective:  Chief Complaint:left knee pain.  HPI: Alexis Olson, 87 y.o. female, has a history of pain and functional disability in the left knee due to arthritis and has failed non-surgical conservative treatments for greater than 12 weeks to includeNSAID's and/or analgesics, corticosteriod injections, and activity modification.  Onset of symptoms was gradual, starting 2 years ago with gradually worsening course since that time. The patient noted no past surgery on the left knee(s).  Patient currently rates pain in the left knee(s) at 9 out of 10 with activity. Patient has worsening of pain with activity and weight bearing, pain that interferes with activities of daily living, and pain with passive range of motion.  Patient has evidence of joint space narrowing by imaging studies. There is no active infection.  Patient Active Problem List   Diagnosis Date Noted   Allergic rhinitis due to animal (cat) (dog) hair and dander 12/04/2020   Allergic rhinitis due to pollen 12/04/2020   Chronic allergic conjunctivitis 12/04/2020   Mild persistent asthma, uncomplicated 38/18/2993   Atrial fibrillation ([place]) 09/04/2020   SAH (subarachnoid hemorrhage) ([place]) 07/12/2020   Pseudogout 07/12/2020   Sinus node dysfunction ([place]) 07/12/2020   Sepsis without acute organ dysfunction ([place])    Arthritis of left knee 04/06/2020   Hyperglycemia 02/11/2020   Gout 02/11/2020   Left foot pain 02/11/2020   Navicular fracture of ankle 12/20/2019   Neck pain 12/20/2019   Late effects of accidental fall 11/13/2019   DVT (deep venous thrombosis) ([place]) 11/13/2019   Hypomagnesemia 11/13/2019   Swelling of right hand 11/13/2019   Stiffness of right knee 10/08/2019   S/P TKR (total knee replacement), right 08/26/2019   Ventral hernia without obstruction or gangrene 05/22/2018   Closed fracture of left pubis ([place]) 11/30/2017    Fracture of pelvis ([place]) 09/26/2017   Osteoarthritis of knee 04/27/2017   Greenfield filter in place 03/08/2017   Pain of right lower extremity 03/08/2017   Preventative health care 02/13/2017   Hyponatremia 05/02/2016   GERD (gastroesophageal reflux disease) 08/23/2015   HSV (herpes simplex virus) infection 08/16/2015   Hyperlipidemia, mild 08/16/2015   Osteoporosis    Hypokalemia    Chronic diarrhea    Anemia of chronic disease    Pallor of optic disc 02/06/2011   OSTEOMYELITIS, VERTEBRA 05/05/2009   [place] DISEASE, LUMBOSACRAL SPINE 04/22/2009   Essential hypertension 03/30/2009   Vitamin B 12 deficiency 07/09/2008   DIVERTICULOSIS, COLON 07/09/2008   COLONIC POLYPS, ADENOMATOUS, HX OF 07/09/2008   Anxiety state 08/08/2007   INSOMNIA 08/08/2007   Hx of bariatric surgery 08/08/2007   Midline back pain 10/30/2006   ALLERGIC RHINITIS 09/18/2006   Asthma 09/18/2006   Past Medical History:  Diagnosis Date   ALLERGIC RHINITIS    ANEMIA, CHRONIC    Malabsorption related to bypass hx, B12 and iron deficiency   ANXIETY    Arthritis    ASTHMA    Bariatric surgery status    Bowel perforation ([place]) 2016   C. difficile diarrhea    Colon perforation ([place]) 08/2014   following polypetomy during colo, surgical repair   COLONIC POLYPS, ADENOMATOUS, HX OF 2010   DIVERTICULOSIS, COLON    DVT (deep venous thrombosis) ([place]) 2018   DVT (deep venous thrombosis) ([facility])    GERD (gastroesophageal reflux disease) 08/23/2015   Graded compression stocking in place    History of blood transfusion    History  of migraine    History of rheumatic fever    HSV (herpes simplex virus) infection 08/16/2015   Hyperlipidemia    Hypertension    Denies   Hyponatremia 05/02/2016   Incomplete left bundle branch block (LBBB) 2016   Noted on EKG   INSOMNIA    Lumbosacral disc disease    Chronic pain; History of osteomyelitis 2010 following ESI complication   Obesity    Osteomyelitis ([facility])    Pelvic fracture  (H[facility])    Pneumonia    Recurrent UTI 08/23/2015   URINARY URGENCY    Ventral hernia without obstruction or gangrene 05/22/2018   VITAMIN B12 DEFICIENCY     Past Surgical History:  Procedure Laterality Date   BACK SURGERY  1994   for ruptured disc   CHOLECYSTECTOMY     COLONOSCOPY     greenfield filter     ileojejunal bypass  1976   for obesity   LAPAROTOMY N/A 09/18/2014   Procedure: EXPLORATORY LAPAROTOMY WITH REPAIR OF CECIAL PERFORATION;  Surgeon: Autumn Messing III, MD;  Location: [facility];  Service: General;  Laterality: N/A;   TONSILLECTOMY     TOTAL KNEE ARTHROPLASTY Right 08/26/2019   Procedure: RIGHT TOTAL KNEE ARTHROPLASTY, LEFT KNEE CORTISONE INJECTION;  Surgeon: Paralee Cancel, MD;  Location: [facility];  Service: Orthopedics;  Laterality: Right;  19 MINS   UPPER GI ENDOSCOPY     VASCULAR SURGERY Right    revascularization right lower extremity    Current Facility-Administered Medications  Medication Dose Route Frequency Provider Last Rate Last Admin   ceFAZolin (ANCEF) IVPB 2g/100 mL premix  2 g Intravenous On Call to OR Irving Copas, PA-C       dexamethasone (DECADRON) injection 8 mg  8 mg Intravenous Once Irving Copas, PA-C       fentaNYL (SUBLIMAZE) injection 50-100 mcg  50-100 mcg Intravenous Madilyn Hook, MD       lactated ringers infusion   Intravenous Continuous Irving Copas, PA-C 75 mL/hr at 01/25/21 0854 New Bag at 01/25/21 2595   lactated ringers infusion   Intravenous Continuous Nunzio Cobbs M, DO       tranexamic acid (CYKLOKAPRON) IVPB 1,000 mg  1,000 mg Intravenous To OR Irving Copas, PA-C       Allergies  Allergen Reactions   Ativan [Lorazepam] Anaphylaxis and Other (See Comments)    Deathly allergic; "resp rate dropped dropped to 6"   Other Anaphylaxis    Red peppers--choking, also   Azithromycin Diarrhea   Benadryl [Diphenhydramine Hcl] Other (See Comments)    Paradoxical response   Ciprofloxacin Hcl Other (See Comments)     Causes yeast infection and refuses to take   Codeine Phosphate Nausea Only    Can tolerate in limited amounts   Hydrocodone Itching and Nausea And Vomiting   Hydrocodone Bit-[place]omatrop Mbr Itching and Nausea And Vomiting   Levaquin [Levofloxacin In D5w] Other (See Comments)    Muscle soreness   Meperidine Hcl Other (See Comments)    Reaction ??   Molds & Smuts Other (See Comments)    unsure   Sulfa Antibiotics Other (See Comments)    Joint pain   Tape Other (See Comments)    No Coban wrap (per the patient)   Ultram [Tramadol Hcl] Other (See Comments)    "Can't move joints"    Social History   Tobacco Use   Smoking status: Never   Smokeless tobacco: Never  Substance Use Topics   Alcohol  use: No    Alcohol/week: 0.0 standard drinks    Family History  Problem Relation Age of Onset   Cervical cancer Mother    Stroke Mother    Liver disease Sister    Kidney disease Sister    Cirrhosis Sister    Colon cancer Neg Hx    Esophageal cancer Neg Hx    Rectal cancer Neg Hx    Stomach cancer Neg Hx      Review of Systems  Constitutional:  Negative for chills and fever.  Respiratory:  Negative for cough and shortness of breath.   Cardiovascular:  Negative for chest pain.  Gastrointestinal:  Negative for nausea and vomiting.  Musculoskeletal:  Positive for arthralgias.    Objective:  Physical Exam Well nourished and well developed. General: Alert and oriented x3, cooperative and pleasant, no acute distress. Head: normocephalic, atraumatic, neck supple. Eyes: EOMI.  Musculoskeletal: Left knee exam: Slight valgus as well as slight flexion contracture. Tenderness to palpation laterally and anteriorly Flexion over 110 degrees with tightness and mild crepitation  Calves soft and nontender. Motor function intact in LE. Strength 5/5 LE bilaterally. Neuro: Distal pulses 2+. Sensation to light touch intact in LE.  Vital signs in last 24 hours: Temp:  [98.3 F (36.8 C)] 98.3 F  (36.8 C) (11/01 0810) Pulse Rate:  [93] 93 (11/01 0810) Resp:  [16] 16 (11/01 0810) BP: (154)/(78) 154/78 (11/01 0810) SpO2:  [95 %] 95 % (11/01 0810) Weight:  [78.1 kg] 78.1 kg (11/01 0810)  Labs:   Estimated body mass index is 29.56 kg/m as calculated from the following:   Height as of this encounter: 5\' 4"  (1.626 m).   Weight as of this encounter: 78.1 kg.   Imaging Review Plain radiographs demonstrate severe degenerative joint disease of the left knee(s). The overall alignment isneutral. The bone quality appears to be adequate for age and reported activity level.      Assessment/Plan:  End stage arthritis, left knee   The patient history, physical examination, clinical judgment of the provider and imaging studies are consistent with end stage degenerative joint disease of the left knee(s) and total knee arthroplasty is deemed medically necessary. The treatment options including medical management, injection therapy arthroscopy and arthroplasty were discussed at length. The risks and benefits of total knee arthroplasty were presented and reviewed. The risks due to aseptic loosening, infection, stiffness, patella tracking problems, thromboembolic complications and other imponderables were discussed. The patient acknowledged the explanation, agreed to proceed with the plan and consent was signed. Patient is being admitted for inpatient treatment for surgery, pain control, PT, OT, prophylactic antibiotics, VTE prophylaxis, progressive ambulation and ADL's and discharge planning. The patient is planning to be discharged  home.   Therapy Plans: outpatient therapy Disposition: Home with husband, son, daughter Planned DVT Prophylaxis: Xarelto 10mg  daily (hx of DVT, a fib) DME needed: walker PCP: Dr. Randel Pigg, Cardiologist: Dr. Dorris Carnes, TXA: IV Allergies: Azithromycin - diarrhea, codeine - nausea, hydrocodone - itching, levaquin - unknown, ativan - nausea, sulfa - ,  tramadol Anesthesia Concerns: none BMI: 30.6 Last HgbA1c: Not diabetic  Other: - Hx of a fib - on Eliquis prior to fall with subarachnoid hemorrhage - was not placed back on Eliquis - Oxycodone, robaxin, tylenol, toradol ? - Wants CPM - Dr. Alvan Dame agrees to 2 weeks of this - Needs flomax post-op for urinary retention  Patient's anticipated LOS is less than 2 midnights, meeting these requirements: - Younger than 56 - Lives within  1 hour of care - Has a competent adult at home to recover with post-op recover - NO history of  - Chronic pain requiring opiods  - Diabetes  - Coronary Artery Disease  - Heart failure  - Heart attack  - Stroke  - DVT/VTE  - Cardiac arrhythmia  - Respiratory Failure/COPD  - Renal failure  - Anemia  - Advanced Liver disease  Griffith Citron, PA-C Orthopedic Surgery [facility] [place] [phone]

## 2021-01-25 NOTE — Care Plan (Signed)
Ortho Bundle Case Management Note  Patient Details  Name: Alexis Olson MRN: [id number] Date of Birth: 1933-04-12  L TKA on 01-25-21 DCP:  Home with husband and children.  1 story home with 4-5 ste. DME:  Has a RW and 3-in-1.  CPM ordered through [facility] PT:  [facility] 01-31-21.                   DME Arranged:  CPM DME Agency:  [facility]  HH Arranged:  NA [place] Agency:  NA  Additional Comments: Please contact me with any questions of if this plan should need to change.  Marianne Sofia, RN,CCM [facility]  [phone] 01/25/2021, 9:07 AM

## 2021-01-25 NOTE — Transfer of Care (Signed)
Immediate Anesthesia Transfer of Care Note  Patient: Alexis Olson  Procedure(s) Performed: TOTAL KNEE ARTHROPLASTY (Left: Knee)  Patient Location: PACU  Anesthesia Type:General  Level of Consciousness: awake, alert  and oriented  Airway & Oxygen Therapy: Patient Spontanous Breathing and Patient connected to face mask oxygen  Post-op Assessment: Report given to RN and Post -op Vital signs reviewed and stable  Post vital signs: Reviewed and stable  Last Vitals:  Vitals Value Taken Time  BP 115/60 01/25/21 1201  Temp    Pulse 83 01/25/21 1206  Resp 13 01/25/21 1206  SpO2 97 % 01/25/21 1206  Vitals shown include unvalidated device data.  Last Pain:  Vitals:   01/25/21 0810  TempSrc: Oral         Complications: No notable events documented.

## 2021-01-25 NOTE — Anesthesia Procedure Notes (Signed)
Anesthesia Regional Block: Adductor canal block   Pre-Anesthetic Checklist: , timeout performed,  Correct Patient, Correct Site, Correct Laterality,  Correct Procedure, Correct Position, site marked,  Risks and benefits discussed,  Pre-op evaluation,  At surgeon's request and post-op pain management  Laterality: Left  Prep: Maximum Sterile Barrier Precautions used, chloraprep       Needles:  Injection technique: Single-shot  Needle Type: Echogenic Stimulator Needle     Needle Length: 9cm  Needle Gauge: 21     Additional Needles:   Procedures:,,,, ultrasound used (permanent image in chart),,    Narrative:  Start time: 01/25/2021 9:22 AM End time: 01/25/2021 9:32 AM Injection made incrementally with aspirations every 5 mL.  Performed by: Personally  Anesthesiologist: Roderic Palau, MD  Additional Notes: 2% Lidocaine skin wheel.

## 2021-01-25 NOTE — Progress Notes (Signed)
Attempted spinal: Pt brought to the OR and placed in a sitting position. Sterile prep and drape. Lidocaine used to anesthetize the skin. Crna attempted spinal at L2-3 and L3-4 with 24G [place]. Unable to locate space. I attempted with 22G [place]ke at L3-4 midline and R paramedian. Unable to locate space. Will proceed with GA.

## 2021-01-25 NOTE — Discharge Instructions (Addendum)
INSTRUCTIONS AFTER JOINT REPLACEMENT   Patient has had low blood pressure while in the hospital.  I recommend she/family call her Cardiologist for input on when to hold her metoprolol at home.   Remove items at home which could result in a fall. This includes throw rugs or furniture in walking pathways ICE to the affected joint every three hours while awake for 30 minutes at a time, for at least the first 3-5 days, and then as needed for pain and swelling.  Continue to use ice for pain and swelling. You may notice swelling that will progress down to the foot and ankle.  This is normal after surgery.  Elevate your leg when you are not up walking on it.   Continue to use the breathing machine you got in the hospital (incentive spirometer) which will help keep your temperature down.  It is common for your temperature to cycle up and down following surgery, especially at night when you are not up moving around and exerting yourself.  The breathing machine keeps your lungs expanded and your temperature down.   DIET:  As you were doing prior to hospitalization, we recommend a well-balanced diet.  DRESSING / WOUND CARE / SHOWERING  Keep the surgical dressing until follow up.  The dressing is water proof, so you can shower without any extra covering.  IF THE DRESSING FALLS OFF or the wound gets wet inside, change the dressing with sterile gauze.  Please use good hand washing techniques before changing the dressing.  Do not use any lotions or creams on the incision until instructed by your surgeon.    ACTIVITY  Increase activity slowly as tolerated, but follow the weight bearing instructions below.   No driving for 6 weeks or until further direction given by your physician.  You cannot drive while taking narcotics.  No lifting or carrying greater than 10 lbs. until further directed by your surgeon. Avoid periods of inactivity such as sitting longer than an hour when not asleep. This helps prevent blood  clots.  You may return to work once you are authorized by your doctor.     WEIGHT BEARING   Weight bearing as tolerated with assist device (walker, cane, etc) as directed, use it as long as suggested by your surgeon or therapist, typically at least 4-6 weeks.   EXERCISES  Results after joint replacement surgery are often greatly improved when you follow the exercise, range of motion and muscle strengthening exercises prescribed by your doctor. Safety measures are also important to protect the joint from further injury. Any time any of these exercises cause you to have increased pain or swelling, decrease what you are doing until you are comfortable again and then slowly increase them. If you have problems or questions, call your caregiver or physical therapist for advice.   Rehabilitation is important following a joint replacement. After just a few days of immobilization, the muscles of the leg can become weakened and shrink (atrophy).  These exercises are designed to build up the tone and strength of the thigh and leg muscles and to improve motion. Often times heat used for twenty to thirty minutes before working out will loosen up your tissues and help with improving the range of motion but do not use heat for the first two weeks following surgery (sometimes heat can increase post-operative swelling).   These exercises can be done on a training (exercise) mat, on the floor, on a table or on a bed. Use whatever works  the best and is most comfortable for you.    Use music or television while you are exercising so that the exercises are a pleasant break in your day. This will make your life better with the exercises acting as a break in your routine that you can look forward to.   Perform all exercises about fifteen times, three times per day or as directed.  You should exercise both the operative leg and the other leg as well.  Exercises include:   Quad Sets - Tighten up the muscle on the front  of the thigh (Quad) and hold for 5-10 seconds.   Straight Leg Raises - With your knee straight (if you were given a brace, keep it on), lift the leg to 60 degrees, hold for 3 seconds, and slowly lower the leg.  Perform this exercise against resistance later as your leg gets stronger.  Leg Slides: Lying on your back, slowly slide your foot toward your buttocks, bending your knee up off the floor (only go as far as is comfortable). Then slowly slide your foot back down until your leg is flat on the floor again.  Angel Wings: Lying on your back spread your legs to the side as far apart as you can without causing discomfort.  Hamstring Strength:  Lying on your back, push your heel against the floor with your leg straight by tightening up the muscles of your buttocks.  Repeat, but this time bend your knee to a comfortable angle, and push your heel against the floor.  You may put a pillow under the heel to make it more comfortable if necessary.   A rehabilitation program following joint replacement surgery can speed recovery and prevent re-injury in the future due to weakened muscles. Contact your doctor or a physical therapist for more information on knee rehabilitation.    CONSTIPATION  Constipation is defined medically as fewer than three stools per week and severe constipation as less than one stool per week.  Even if you have a regular bowel pattern at home, your normal regimen is likely to be disrupted due to multiple reasons following surgery.  Combination of anesthesia, postoperative narcotics, change in appetite and fluid intake all can affect your bowels.   YOU MUST use at least one of the following options; they are listed in order of increasing strength to get the job done.  They are all available over the counter, and you may need to use some, POSSIBLY even all of these options:    Drink plenty of fluids (prune juice may be helpful) and high fiber foods Colace 100 mg by mouth twice a day   Senokot for constipation as directed and as needed Dulcolax (bisacodyl), take with full glass of water  Miralax (polyethylene glycol) once or twice a day as needed.  If you have tried all these things and are unable to have a bowel movement in the first 3-4 days after surgery call either your surgeon or your primary doctor.    If you experience loose stools or diarrhea, hold the medications until you stool forms back up.  If your symptoms do not get better within 1 week or if they get worse, check with your doctor.  If you experience "the worst abdominal pain ever" or develop nausea or vomiting, please contact the office immediately for further recommendations for treatment.   ITCHING:  If you experience itching with your medications, try taking only a single pain pill, or even half a pain pill at  a time.  You can also use Benadryl over the counter for itching or also to help with sleep.   TED HOSE STOCKINGS:  Use stockings on both legs until for at least 2 weeks or as directed by physician office. They may be removed at night for sleeping.  MEDICATIONS:  See your medication summary on the "After Visit Summary" that nursing will review with you.  You may have some home medications which will be placed on hold until you complete the course of blood thinner medication.  It is important for you to complete the blood thinner medication as prescribed.  Information on my medicine - ELIQUIS (apixaban)  Why was Eliquis prescribed for you? Eliquis was prescribed for you to reduce the risk of blood clots forming after orthopedic surgery.    What do You need to know about Eliquis? Take your Eliquis TWICE DAILY - one tablet in the morning and one tablet in the evening with or without food.  It would be best to take the dose about the same time each day.  If you have difficulty swallowing the tablet whole please discuss with your pharmacist how to take the medication safely.  Take Eliquis exactly  as prescribed by your doctor and DO NOT stop taking Eliquis without talking to the doctor who prescribed the medication.  Stopping without other medication to take the place of Eliquis may increase your risk of developing a clot.  After discharge, you should have regular check-up appointments with your healthcare provider that is prescribing your Eliquis.  What do you do if you miss a dose? If a dose of ELIQUIS is not taken at the scheduled time, take it as soon as possible on the same day and twice-daily administration should be resumed.  The dose should not be doubled to make up for a missed dose.  Do not take more than one tablet of ELIQUIS at the same time.  Important Safety Information A possible side effect of Eliquis is bleeding. You should call your healthcare provider right away if you experience any of the following: Bleeding from an injury or your nose that does not stop. Unusual colored urine (red or dark brown) or unusual colored stools (red or black). Unusual bruising for unknown reasons. A serious fall or if you hit your head (even if there is no bleeding).  Some medicines may interact with Eliquis and might increase your risk of bleeding or clotting while on Eliquis. To help avoid this, consult your healthcare provider or pharmacist prior to using any new prescription or non-prescription medications, including herbals, vitamins, non-steroidal anti-inflammatory drugs (NSAIDs) and supplements.  This website has more information on Eliquis (apixaban): http://www.eliquis.com/eliquis/home   PRECAUTIONS:  If you experience chest pain or shortness of breath - call 911 immediately for transfer to the hospital emergency department.   If you develop a fever greater that 101 F, purulent drainage from wound, increased redness or drainage from wound, foul odor from the wound/dressing, or calf pain - CONTACT YOUR SURGEON.                                                   FOLLOW-UP  APPOINTMENTS:  If you do not already have a post-op appointment, please call the office for an appointment to be seen by your surgeon.  Guidelines for how soon to be seen  are listed in your "After Visit Summary", but are typically between 1-4 weeks after surgery.  OTHER INSTRUCTIONS:   Knee Replacement:  Do not place pillow under knee, focus on keeping the knee straight while resting. CPM instructions: 0-90 degrees, 2 hours in the morning, 2 hours in the afternoon, and 2 hours in the evening. Place foam block, curve side up under heel at all times except when in CPM or when walking.  DO NOT modify, tear, cut, or change the foam block in any way.  POST-OPERATIVE OPIOID TAPER INSTRUCTIONS: It is important to wean off of your opioid medication as soon as possible. If you do not need pain medication after your surgery it is ok to stop day one. Opioids include: Codeine, Hydrocodone(Norco, Vicodin), Oxycodone(Percocet, oxycontin) and hydromorphone amongst others.  Long term and even short term use of opiods can cause: Increased pain response Dependence Constipation Depression Respiratory depression And more.  Withdrawal symptoms can include Flu like symptoms Nausea, vomiting And more Techniques to manage these symptoms Hydrate well Eat regular healthy meals Stay active Use relaxation techniques(deep breathing, meditating, yoga) Do Not substitute Alcohol to help with tapering If you have been on opioids for less than two weeks and do not have pain than it is ok to stop all together.  Plan to wean off of opioids This plan should start within one week post op of your joint replacement. Maintain the same interval or time between taking each dose and first decrease the dose.  Cut the total daily intake of opioids by one tablet each day Next start to increase the time between doses. The last dose that should be eliminated is the evening dose.   MAKE SURE YOU:  Understand these instructions.   Get help right away if you are not doing well or get worse.    Thank you for letting us be a part of your medical care team.  It is a privilege we respect greatly.  We hope these instructions will help you stay on track for a fast and full recovery!

## 2021-01-25 NOTE — Anesthesia Procedure Notes (Signed)
Procedure Name: LMA Insertion Date/Time: 01/25/2021 10:24 AM Performed by: [place] Luwana Butrick C, CRNA Pre-anesthesia Checklist: Patient identified, Emergency Drugs available, Suction available and Patient being monitored Patient Re-evaluated:Patient Re-evaluated prior to induction Oxygen Delivery Method: Circle system utilized Preoxygenation: Pre-oxygenation with 100% oxygen Induction Type: IV induction Ventilation: Mask ventilation without difficulty LMA: LMA inserted LMA Size: 4.0 Number of attempts: 1 Airway Equipment and Method: Bite block Placement Confirmation: positive ETCO2 Tube secured with: Tape Dental Injury: Teeth and Oropharynx as per pre-operative assessment

## 2021-01-25 NOTE — Anesthesia Preprocedure Evaluation (Addendum)
Anesthesia Evaluation  Patient identified by MRN, date of birth, ID band Patient awake    Reviewed: Allergy & Precautions, H&P , NPO status , Patient's Chart, lab work & pertinent test results, reviewed documented beta blocker date and time   Airway Mallampati: III  TM Distance: >3 FB Neck ROM: Full    Dental no notable dental hx. (+) Teeth Intact, Dental Advisory Given   Pulmonary asthma ,    Pulmonary exam normal breath sounds clear to auscultation       Cardiovascular hypertension, Pt. on medications and Pt. on home beta blockers + dysrhythmias  Rhythm:Regular Rate:Normal     Neuro/Psych Anxiety negative neurological ROS     GI/Hepatic Neg liver ROS, GERD  Medicated,  Endo/Other  negative endocrine ROS  Renal/GU negative Renal ROS  negative genitourinary   Musculoskeletal  (+) Arthritis , Osteoarthritis,    Abdominal   Peds  Hematology  (+) Blood dyscrasia, anemia ,   Anesthesia Other Findings   Reproductive/Obstetrics negative OB ROS                            Anesthesia Physical Anesthesia Plan  ASA: 3  Anesthesia Plan: Spinal   Post-op Pain Management:  Regional for Post-op pain   Induction: Intravenous  PONV Risk Score and Plan: 3 and Propofol infusion, Ondansetron and Dexamethasone  Airway Management Planned: Simple Face Mask  Additional Equipment:   Intra-op Plan:   Post-operative Plan:   Informed Consent: I have reviewed the patients History and Physical, chart, labs and discussed the procedure including the risks, benefits and alternatives for the proposed anesthesia with the patient or authorized representative who has indicated his/her understanding and acceptance.     Dental advisory given  Plan Discussed with: CRNA  Anesthesia Plan Comments:         Anesthesia Quick Evaluation

## 2021-01-25 NOTE — Op Note (Signed)
NAME:  [facility]                      MEDICAL RECORD NO.:  [id number]                             FACILITY:  [facility]      PHYSICIAN:  Pietro Cassis. Alvan Dame, M.D.  DATE OF BIRTH:  February 27, 1934      DATE OF PROCEDURE:  01/25/2021                                     OPERATIVE REPORT         PREOPERATIVE DIAGNOSIS:  Left knee osteoarthritis.      POSTOPERATIVE DIAGNOSIS:  Left knee osteoarthritis.      FINDINGS:  The patient was noted to have complete loss of cartilage and   bone-on-bone arthritis with associated osteophytes in the lateral and patellofemoral compartments of   the knee with a significant synovitis and associated effusion.  The patient had failed months of conservative treatment including medications, injection therapy, activity modification.     PROCEDURE:  Left total knee replacement.      COMPONENTS USED:  DePuy Attune rotating platform posterior stabilized knee   system, a size 4N femur, 4 tibia, size 7 mm PS AOX insert, and 35 anatomic patellar   button.      SURGEON:  Pietro Cassis. Alvan Dame, M.D.      ASSISTANT:  Costella Hatcher, PA-C.      ANESTHESIA:  General and Regional.      SPECIMENS:  None.      COMPLICATION:  None.      DRAINS:  None.  EBL: <100 cc      TOURNIQUET TIME:   Total Tourniquet Time Documented: Thigh (Left) - 26 minutes Total: Thigh (Left) - 26 minutes  .      The patient was stable to the recovery room.      INDICATION FOR PROCEDURE:  Alexis Olson is a 87 y.o. female patient of   mine.  The patient had been seen, evaluated, and treated for months conservatively in the   office with medication, activity modification, and injections.  The patient had   radiographic changes of bone-on-bone arthritis with endplate sclerosis and osteophytes noted.  Based on the radiographic changes and failed conservative measures, the patient   decided to proceed with definitive treatment, total knee replacement.  Risks of infection, DVT, component failure, need  for revision surgery, neurovascular injury were reviewed in the office setting.  The postop course was reviewed stressing the efforts to maximize post-operative satisfaction and function.  Consent was obtained for benefit of pain   relief.      PROCEDURE IN DETAIL:  The patient was brought to the operative theater.   Once adequate anesthesia, preoperative antibiotics, 2 gm of Ancef,1 gm of Tranexamic Acid, and 10 mg of Decadron administered, the patient was positioned supine with a left thigh tourniquet placed.  The  left lower extremity was prepped and draped in sterile fashion.  A time-   out was performed identifying the patient, planned procedure, and the appropriate extremity.      The left lower extremity was placed in the [facility] leg holder.  The leg was   exsanguinated, tourniquet elevated to 250 mmHg.  A midline incision was  made followed by median parapatellar arthrotomy.  Following initial   exposure, attention was first directed to the patella.  Precut   measurement was noted to be 22 mm.  I resected down to 13 mm and used a   35 anatomic patellar button to restore patellar height as well as cover the cut surface.      The lug holes were drilled and a metal shim was placed to protect the   patella from retractors and saw blade during the procedure.      At this point, attention was now directed to the femur.  The femoral   canal was opened with a drill, irrigated to try to prevent fat emboli.  An   intramedullary rod was passed at 3 degrees valgus, 9 mm of bone was   resected off the distal femur.  Following this resection, the tibia was   subluxated anteriorly.  Using the extramedullary guide, 4 mm of bone was resected off   the proximal lateral tibia.  We confirmed the gap would be   stable medially and laterally with a size 5 spacer block as well as confirmed that the tibial cut was perpendicular in the coronal plane, checking with an alignment rod.      Once this was done,  I sized the femur to be a size 4 in the anterior-   posterior dimension, chose a narrow component based on medial and   lateral dimension.  The size 4 rotation block was then pinned in   position anterior referenced using the C-clamp to set rotation.  The   anterior, posterior, and  chamfer cuts were made without difficulty nor   notching making certain that I was along the anterior cortex to help   with flexion gap stability.      The final box cut was made off the lateral aspect of distal femur.      At this point, the tibia was sized to be a size 4.  The size 4 tray was   then pinned in position through the medial third of the tubercle,   drilled, and keel punched.  Trial reduction was now carried with a 4 femur,  4 tibia, a size 7 mm PS insert, and the 35 anatomic patella botton.  The knee was brought to full extension with good flexion stability with the patella   tracking through the trochlea without application of pressure.  Given   all these findings the trial components removed.  Final components were   opened and cement was mixed.  The knee was irrigated with normal saline solution and pulse lavage.  The synovial lining was   then injected with 30 cc of 0.25% Marcaine with epinephrine, 1 cc of Toradol and 30 cc of NS for a total of 61 cc.     Final implants were then cemented onto cleaned and dried cut surfaces of bone with the knee brought to extension with a size 7 mm PS trial insert.      Once the cement had fully cured, excess cement was removed   throughout the knee.  I confirmed that I was satisfied with the range of   motion and stability, and the final size 7 mm PS AOX insert was chosen.  It was   placed into the knee.      The tourniquet had been let down at 26 minutes.  No significant   hemostasis was required.  The extensor mechanism was then reapproximated using #1 Vicryl  and #1 Stratafix sutures with the knee   in flexion.  The   remaining wound was closed with 2-0  Vicryl and running 4-0 Monocryl.   The knee was cleaned, dried, dressed sterilely using Dermabond and   Aquacel dressing.  The patient was then   brought to recovery room in stable condition, tolerating the procedure   well.   Please note that Physician Assistant, Costella Hatcher, PA-C was present for the entirety of the case, and was utilized for pre-operative positioning, peri-operative retractor management, general facilitation of the procedure and for primary wound closure at the end of the case.              Pietro Cassis Alvan Dame, M.D.    01/25/2021 11:23 AM

## 2021-01-25 NOTE — Progress Notes (Signed)
Assisted Dr. Edmond Fitzgerald with left, ultrasound guided, adductor canal block. Side rails up, monitors on throughout procedure. See vital signs in flow sheet. Tolerated Procedure well. 

## 2021-01-25 NOTE — Anesthesia Postprocedure Evaluation (Signed)
Anesthesia Post Note  Patient: Mera Gunkel Stann  Procedure(s) Performed: TOTAL KNEE ARTHROPLASTY (Left: Knee)     Patient location during evaluation: PACU Anesthesia Type: Regional and General Level of consciousness: awake and alert Pain management: pain level controlled Vital Signs Assessment: post-procedure vital signs reviewed and stable Respiratory status: spontaneous breathing, nonlabored ventilation, respiratory function stable and patient connected to nasal cannula oxygen Cardiovascular status: blood pressure returned to baseline and stable Postop Assessment: no apparent nausea or vomiting Anesthetic complications: no   No notable events documented.  Last Vitals:  Vitals:   01/25/21 1300 01/25/21 1322  BP: 106/71 115/61  Pulse: 79 74  Resp: 12 16  Temp:  (!) 36.4 C  SpO2: 99% 95%    Last Pain:  Vitals:   01/25/21 1244  TempSrc:   PainSc: Asleep                 Jasaun Carn,W. EDMOND

## 2021-01-25 NOTE — Evaluation (Signed)
Physical Therapy Evaluation Patient Details Name: Alexis Olson MRN: [id number] DOB: 05-Apr-1933 Today's Date: 01/25/2021  History of Present Illness  87 yo female s/p L TKA 01/25/21. Hx of R TKA 2021, SAH, multiple falls, DVT.  Clinical Impression  On eval, pt required Min A for mobility. She walked ~10 feet with a RW. Moderate pain with activity. Pt reported some mild lightheadedness during session. Will follow and progress activity as tolerated. Plan is for d/c home once meeting PT goals and pain is controlled.        Recommendations for follow up therapy are one component of a multi-disciplinary discharge planning process, led by the attending physician.  Recommendations may be updated based on patient status, additional functional criteria and insurance authorization.  Follow Up Recommendations Follow physician's recommendations for discharge plan and follow up therapies    Assistance Recommended at Discharge Frequent or constant Supervision/Assistance  Functional Status Assessment Patient has had a recent decline in their functional status and demonstrates the ability to make significant improvements in function in a reasonable and predictable amount of time.  Equipment Recommendations       Recommendations for Other Services       Precautions / Restrictions Precautions Precautions: Fall;Knee Restrictions Weight Bearing Restrictions: No Other Position/Activity Restrictions: WBAT      Mobility  Bed Mobility Overal bed mobility: Needs Assistance Bed Mobility: Supine to Sit;Sit to Supine     Supine to sit: HOB elevated;Min guard     General bed mobility comments: Assist for L LE.    Transfers Overall transfer level: Needs assistance Equipment used: Rolling walker (2 wheels) Transfers: Sit to/from Stand Sit to Stand: Min assist;From elevated surface           General transfer comment: 2 attempts to get to standing. Cues for safety, technique, hand/LE placement.     Ambulation/Gait Ambulation/Gait assistance: Min assist Gait Distance (Feet): 10 Feet Assistive device: Rolling walker (2 wheels) Gait Pattern/deviations: Step-to pattern;Antalgic;Decreased stance time - left     General Gait Details: Cues for safety, technique, sequence. Assist to stabilize pt. Pt reported some mild lightheadedness.  Stairs            Wheelchair Mobility    Modified Rankin (Stroke Patients Only)       Balance Overall balance assessment: Needs assistance         Standing balance support: Bilateral upper extremity supported Standing balance-Leahy Scale: Poor                               Pertinent Vitals/Pain Pain Assessment: 0-10 Pain Score: 7  Pain Location: L knee Pain Descriptors / Indicators: Discomfort;Sore Pain Intervention(s): Limited activity within patient's tolerance;Monitored during session;Ice applied;Repositioned    Home Living Family/patient expects to be discharged to:: Private residence Living Arrangements: Spouse/significant other Available Help at Discharge: Family Type of Home: House Home Access: Stairs to enter Entrance Stairs-Rails: Left Entrance Stairs-Number of Steps: 4-5   Home Layout: One level Home Equipment: Conservation officer, nature (2 wheels);Cane - single point;BSC      Prior Function Prior Level of Function : Independent/Modified Independent                     Hand Dominance        Extremity/Trunk Assessment   Upper Extremity Assessment Upper Extremity Assessment: Overall WFL for tasks assessed    Lower Extremity Assessment Lower Extremity Assessment: Generalized weakness  Cervical / Trunk Assessment Cervical / Trunk Assessment: Normal  Communication   Communication: No difficulties  Cognition Arousal/Alertness: Awake/alert Behavior During Therapy: WFL for tasks assessed/performed Overall Cognitive Status: Within Functional Limits for tasks assessed                                           General Comments      Exercises     Assessment/Plan    PT Assessment Patient needs continued PT services  PT Problem List Decreased strength;Decreased mobility;Decreased range of motion;Decreased activity tolerance;Decreased balance;Decreased knowledge of use of DME;Pain       PT Treatment Interventions DME instruction;Gait training;Therapeutic activities;Patient/family education;Therapeutic exercise;Stair training;Balance training;Functional mobility training    PT Goals (Current goals can be found in the Care Plan section)  Acute Rehab PT Goals Patient Stated Goal: regain independence PT Goal Formulation: With patient Time For Goal Achievement: 02/08/21 Potential to Achieve Goals: Good    Frequency 7X/week   Barriers to discharge        Co-evaluation               AM-PAC PT "6 Clicks" Mobility  Outcome Measure Help needed turning from your back to your side while in a flat bed without using bedrails?: A Little Help needed moving from lying on your back to sitting on the side of a flat bed without using bedrails?: A Little Help needed moving to and from a bed to a chair (including a wheelchair)?: A Little Help needed standing up from a chair using your arms (e.g., wheelchair or bedside chair)?: A Little Help needed to walk in hospital room?: A Little Help needed climbing 3-5 steps with a railing? : A Lot 6 Click Score: 17    End of Session Equipment Utilized During Treatment: Gait belt Activity Tolerance: Patient tolerated treatment well Patient left: in bed;with call bell/phone within reach;with bed alarm set   PT Visit Diagnosis: Pain;Other abnormalities of gait and mobility (R26.89) Pain - Right/Left: Right Pain - part of body: Knee    Time: 4742-5956 PT Time Calculation (min) (ACUTE ONLY): 28 min   Charges:   PT Evaluation $PT Eval Low Complexity: 1 Low PT Treatments $Gait Training: 8-22 mins          Doreatha Massed, PT Acute Rehabilitation  Office: [phone] Pager: [phone]

## 2021-01-26 ENCOUNTER — Encounter (HOSPITAL_COMMUNITY): Payer: Self-pay | Admitting: Orthopedic Surgery

## 2021-01-26 LAB — BASIC METABOLIC PANEL
Anion gap: 6 (ref 5–15)
BUN: 14 mg/dL (ref 8–23)
CO2: 25 mmol/L (ref 22–32)
Calcium: 8 mg/dL — ABNORMAL LOW (ref 8.9–10.3)
Chloride: 99 mmol/L (ref 98–111)
Creatinine, Ser: 0.5 mg/dL (ref 0.44–1.00)
GFR, Estimated: >60 mL/min (ref >60)
Glucose, Bld: 118 mg/dL — ABNORMAL HIGH (ref 70–99)
Potassium: 4.5 mmol/L (ref 3.5–5.1)
Sodium: 130 mmol/L — ABNORMAL LOW (ref 135–145)

## 2021-01-26 LAB — CBC
HCT: 27.8 % — ABNORMAL LOW (ref 36.0–46.0)
Hemoglobin: 8.7 g/dL — ABNORMAL LOW (ref 12.0–15.0)
MCH: 31.1 pg (ref 26.0–34.0)
MCHC: 31.3 g/dL (ref 30.0–36.0)
MCV: 99.3 fL (ref 80.0–100.0)
Platelets: 214 10*3/uL (ref 150–400)
RBC: 2.8 MIL/uL — ABNORMAL LOW (ref 3.87–5.11)
RDW: 13.3 % (ref 11.5–15.5)
WBC: 10.7 10*3/uL — ABNORMAL HIGH (ref 4.0–10.5)
nRBC: 0 % (ref 0.0–0.2)

## 2021-01-26 MED ORDER — SODIUM CHLORIDE 0.9 % IV BOLUS
500.0000 mL | Freq: Once | INTRAVENOUS | Status: AC
  Administered 2021-01-26: 500 mL via INTRAVENOUS

## 2021-01-26 MED ORDER — SODIUM CHLORIDE 0.9 % IV BOLUS: 500.0000 mL | Freq: Once | INTRAVENOUS | Status: DC

## 2021-01-26 MED ORDER — SODIUM CHLORIDE 0.9 % IV BOLUS
250.0000 mL | Freq: Once | INTRAVENOUS | Status: AC
  Administered 2021-01-26: 250 mL via INTRAVENOUS

## 2021-01-26 NOTE — TOC Transition Note (Signed)
Transition of Care [facility]) - CM/SW Discharge Note  Patient Details  Name: Alexis Olson MRN: [id number] Date of Birth: 1933/12/16  Transition of Care [facility]) CM/SW Contact:  Sherie Don, LCSW Phone Number: 01/26/2021, 10:09 AM  Clinical Narrative: Patient is expected to discharge home after working with PT. CSW met with patient to review discharge plan. Patient will discharge home with OPPT at [facility] on 01/31/21. Patient asked about inpatient rehab and CSW explained patient does not meet criteria for CIR at this time. Patient has a rolling walker and 3N1 at home. CSW confirmed with Cristie Hem with [employer] that the patient will be set up with a CPM at home. TOC signing off.  Final next level of care: OP Rehab Barriers to Discharge: No Barriers Identified  Patient Goals and CMS Choice Patient states their goals for this hospitalization and ongoing recovery are:: Discharge home with [place] CMS Medicare.gov Compare Post Acute Care list provided to:: Patient Choice offered to / list presented to : Patient  Discharge Plan and Services          DME Arranged: CPM DME Agency: [employer] Representative spoke with at DME Agency: Prearranged in orthopedist's office HH Arranged: NA [place] Agency: NA  Readmission Risk Interventions Readmission Risk Prevention Plan 11/16/2019  Transportation Screening Complete  PCP or Specialist Appt within 5-7 Days Complete  Home Care Screening Complete  Medication Review (RN CM) Complete  Some recent data might be hidden

## 2021-01-26 NOTE — Progress Notes (Signed)
Orthopedic Tech Progress Note Patient Details:  Alexis Olson April 24, 1933 [id number]  Patient ID: [name], female   DOB: [birth year], 87 y.o.   MRN: [id number] CPM off at 11:55.  Vernona Rieger 01/26/2021, 11:56 AM

## 2021-01-26 NOTE — Progress Notes (Signed)
Orthopedic Tech Progress Note Patient Details:  Alexis Olson 10-29-1933 [id number]  Patient ID: Alexis Olson, female   DOB: 09-22-1933, 87 y.o.   MRN: [id number] Pt is about to begin a session with physical therapy, I will return later to place pt in [place] 01/26/2021, 10:09 AM

## 2021-01-26 NOTE — Progress Notes (Signed)
pOrthopedic Tech Progress Note Patient Details:  [name] 10-Aug-1933 [id number]  Patient ID: Alexis Olson, female   DOB: [birth year], 87 y.o.   MRN: [id number] Pt declined second CPM session, wants to try again in the morning depending on how her pain is.  Vernona Rieger 01/26/2021, 7:48 PM

## 2021-01-26 NOTE — Progress Notes (Signed)
Physical Therapy Treatment Patient Details Name: Alexis Olson MRN: [id number] DOB: 1933/04/11 Today's Date: 01/26/2021   History of Present Illness 87 yo female s/p L TKA 01/25/21. Hx of R TKA 2021, SAH, multiple falls, DVT.    PT Comments    Pt agreeable to working with PT this afternoon. She was only able to ambulate ~15 feet-limited by fatigue and pain. BP 105/71 after walk. Will continue to follow and progress activity as tolerated.    Recommendations for follow up therapy are one component of a multi-disciplinary discharge planning process, led by the attending physician.  Recommendations may be updated based on patient status, additional functional criteria and insurance authorization.  Follow Up Recommendations  Follow physician's recommendations for discharge plan and follow up therapies     Assistance Recommended at Discharge Frequent or constant Supervision/Assistance  Equipment Recommendations  None recommended by PT    Recommendations for Other Services       Precautions / Restrictions Precautions Precautions: Fall;Knee Restrictions Weight Bearing Restrictions: No LLE Weight Bearing: Weight bearing as tolerated     Mobility  Bed Mobility Overal bed mobility: Needs Assistance Bed Mobility: Supine to Sit     Supine to sit: HOB elevated;Min assist     General bed mobility comments: Assist for L LE.    Transfers Overall transfer level: Needs assistance Equipment used: Rolling walker (2 wheels) Transfers: Sit to/from Stand Sit to Stand: Min assist;From elevated surface           General transfer comment: Assist to power up, steady, control descent. Cues for safety, technique, hand/LE placement.    Ambulation/Gait Ambulation/Gait assistance: Min assist; +2 safety/equipment (chair follow) Gait Distance (Feet): 15 Feet Assistive device: Rolling walker (2 wheels) Gait Pattern/deviations: Step-to pattern;Antalgic;Decreased stance time - left;Trunk  flexed     General Gait Details: Cues for safety, technique, sequence. Assist to stabilize pt. Pt reported fatigue and pain. Used recliner to transport pt back to room.   Stairs             Wheelchair Mobility    Modified Rankin (Stroke Patients Only)       Balance Overall balance assessment: Needs assistance;History of Falls         Standing balance support: Bilateral upper extremity supported Standing balance-Leahy Scale: Poor                              Cognition Arousal/Alertness: Awake/alert Behavior During Therapy: WFL for tasks assessed/performed Overall Cognitive Status: Within Functional Limits for tasks assessed                                          Exercises Total Joint Exercises Ankle Circles/Pumps: AROM;Both Quad Sets: AROM;Both;10 reps Hip ABduction/ADduction: AAROM;Left;10 reps Straight Leg Raises: AAROM;10 reps Knee Flexion: AAROM;Left;10 reps;Seated Goniometric ROM: ~10-65 degrees    General Comments        Pertinent Vitals/Pain Pain Assessment: Faces Faces Pain Scale: Hurts even more Pain Location: L knee Pain Descriptors / Indicators: Discomfort;Sore;Sharp;Grimacing Pain Intervention(s): Limited activity within patient's tolerance;Monitored during session;Ice applied;Repositioned    Home Living                          Prior Function            PT Goals (current goals  can now be found in the care plan section) Progress towards PT goals: Progressing toward goals    Frequency    7X/week      PT Plan Current plan remains appropriate    Co-evaluation              AM-PAC PT "6 Clicks" Mobility   Outcome Measure  Help needed turning from your back to your side while in a flat bed without using bedrails?: A Little Help needed moving from lying on your back to sitting on the side of a flat bed without using bedrails?: A Little Help needed moving to and from a bed to a chair  (including a wheelchair)?: A Little Help needed standing up from a chair using your arms (e.g., wheelchair or bedside chair)?: A Little Help needed to walk in hospital room?: A Little Help needed climbing 3-5 steps with a railing? : A Lot 6 Click Score: 17    End of Session Equipment Utilized During Treatment: Gait belt Activity Tolerance: Patient limited by fatigue;Patient limited by pain Patient left: in chair;with call bell/phone within reach;with family/visitor present;with chair alarm set   PT Visit Diagnosis: Pain;Other abnormalities of gait and mobility (R26.89) Pain - Right/Left: Left Pain - part of body: Knee     Time: 1447-1520 PT Time Calculation (min) (ACUTE ONLY): 33 min  Charges:  $Gait Training: 8-22 mins $Therapeutic Exercise: 8-22 mins                         Doreatha Massed, PT Acute Rehabilitation  Office: [phone] Pager: [phone]

## 2021-01-26 NOTE — Progress Notes (Signed)
Orthopedic Tech Progress Note Patient Details:  Alexis Olson [facility] 04-Jun-1933 [id number]  CPM Left Knee Left Knee Flexion (Degrees): 70 Left Knee Extension (Degrees): 0  Post Interventions Patient Tolerated: Well CPM applied at 11:00 am, pt unable to tolerate 90 degrees, so I set it to 70 degrees instead.  Alexis Olson 01/26/2021, 11:11 AM

## 2021-01-26 NOTE — Plan of Care (Signed)
  Problem: Activity: Goal: Ability to avoid complications of mobility impairment will improve Outcome: Progressing   Problem: Clinical Measurements: Goal: Postoperative complications will be avoided or minimized Outcome: Progressing   Problem: Pain Management: Goal: Pain level will decrease with appropriate interventions Outcome: Progressing   

## 2021-01-26 NOTE — Progress Notes (Signed)
Subjective: 1 Day Post-Op Procedure(s) (LRB): TOTAL KNEE ARTHROPLASTY (Left) Patient reports pain as mild.   Patient seen in rounds with Dr. Alvan Dame. Patient is well, and has had no acute complaints or problems. No acute events overnight. Foley catheter removed. Patient ambulated 10 feet with PT.  We will continue therapy today.   Objective: Vital signs in last 24 hours: Temp:  [96.8 F (36 C)-98.2 F (36.8 C)] 98 F (36.7 C) (11/02 0446) Pulse Rate:  [36-84] 65 (11/02 0811) Resp:  [6-20] 16 (11/02 0811) BP: (96-125)/(50-80) 103/50 (11/02 0811) SpO2:  [91 %-100 %] 96 % (11/02 0811)  Intake/Output from previous day:  Intake/Output Summary (Last 24 hours) at 01/26/2021 0824 Last data filed at 01/26/2021 0701 Gross per 24 hour  Intake 3402.51 ml  Output 3050 ml  Net 352.51 ml     Intake/Output this shift: Total I/O In: -  Out: 400 [Urine:200; Blood:200]  Labs: Recent Labs    01/26/21 0326  HGB 8.7*   Recent Labs    01/26/21 0326  WBC 10.7*  RBC 2.80*  HCT 27.8*  PLT 214   Recent Labs    01/26/21 0326  NA 130*  K 4.5  CL 99  CO2 25  BUN 14  CREATININE 0.50  GLUCOSE 118*  CALCIUM 8.0*   No results for input(s): LABPT, INR in the last 72 hours.  Exam: General - Patient is Alert and Oriented Extremity - Neurologically intact Sensation intact distally Intact pulses distally Dorsiflexion/Plantar flexion intact Dressing - dressing C/D/I Motor Function - intact, moving foot and toes well on exam.   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 difficile diarrhea    Colon perforation ([place]) 08/2014   following polypetomy during colo, surgical repair   COLONIC POLYPS, ADENOMATOUS, HX OF 2010   DIVERTICULOSIS, COLON    DVT (deep venous thrombosis) ([place]) 2018    DVT (deep venous thrombosis) ([facility])    GERD (gastroesophageal reflux disease) 08/23/2015   Graded compression stocking in place    History of blood transfusion    History of migraine    History of rheumatic fever    HSV (herpes simplex virus) infection 08/16/2015   Hyperlipidemia    Hypertension    Denies   Hyponatremia 05/02/2016   Incomplete left bundle branch block (LBBB) 2016   Noted on EKG   INSOMNIA    Lumbosacral disc disease    Chronic pain; History of osteomyelitis 2010 following ESI complication   Obesity    Osteomyelitis ([facility])    Pelvic fracture ([facility])    Pneumonia    Recurrent UTI 08/23/2015   URINARY URGENCY    Ventral hernia without obstruction or gangrene 05/22/2018   VITAMIN B12 DEFICIENCY     Assessment/Plan: 1 Day Post-Op Procedure(s) (LRB): TOTAL KNEE ARTHROPLASTY (Left) Active Problems:   S/P total knee arthroplasty, left  Estimated body mass index is 29.56 kg/m as calculated from the following:   Height as of this encounter: 5\' 4"  (1.626 m).   Weight as of this encounter: 78.1 kg. Advance diet Up with therapy   Patient's anticipated LOS is less than 2 midnights, meeting these requirements: - Younger than 13 - Lives within 1 hour of care - Has  a competent adult at home to recover with post-op recover - NO history of  - Chronic pain requiring opiods  - Diabetes  - Coronary Artery Disease  - Heart failure  - Heart attack  - Stroke  - DVT/VTE  - Cardiac arrhythmia  - Respiratory Failure/COPD  - Renal failure  - Anemia  - Advanced Liver disease     DVT Prophylaxis -  Eliquis 2.5 BID Weight bearing as tolerated.  Hgb stable at 8.7 this AM. BP soft with some dizziness yesterday, will order fluid bolus.   Plan is to go Home after hospital stay. We have ordered home CPM per patient's request. Will also order while here.   Plan to get up with PT today to continue working on ambulation. May require additional night stay to achieve safe  ambulation for discharge home.   Griffith Citron, PA-C Orthopedic Surgery [phone] 01/26/2021, 8:24 AM

## 2021-01-26 NOTE — Plan of Care (Signed)
  Problem: Education: Goal: Knowledge of General Education information will improve Description Including pain rating scale, medication(s)/side effects and non-pharmacologic comfort measures Outcome: Progressing   

## 2021-01-26 NOTE — Progress Notes (Signed)
PT Cancellation Note  Patient Details Name: Alexis Olson MRN: [id number] DOB: May 13, 1933   Cancelled Treatment:    Reason Eval/Treat Not Completed: Medical issues which prohibited therapy--low BP+ pt c/o nausea. will check back later this afternoon if pt is up to participating with PT today.     [place] Acute Rehabilitation  Office: [phone] Pager: [phone]

## 2021-01-27 ENCOUNTER — Other Ambulatory Visit (HOSPITAL_COMMUNITY): Payer: Self-pay

## 2021-01-27 DIAGNOSIS — Z9884 Bariatric surgery status: Secondary | ICD-10-CM | POA: Diagnosis not present

## 2021-01-27 DIAGNOSIS — Z823 Family history of stroke: Secondary | ICD-10-CM | POA: Diagnosis not present

## 2021-01-27 DIAGNOSIS — E669 Obesity, unspecified: Secondary | ICD-10-CM | POA: Diagnosis present

## 2021-01-27 DIAGNOSIS — Z86718 Personal history of other venous thrombosis and embolism: Secondary | ICD-10-CM | POA: Diagnosis not present

## 2021-01-27 DIAGNOSIS — Z6830 Body mass index (BMI) 30.0-30.9, adult: Secondary | ICD-10-CM | POA: Diagnosis not present

## 2021-01-27 DIAGNOSIS — Z79899 Other long term (current) drug therapy: Secondary | ICD-10-CM | POA: Diagnosis not present

## 2021-01-27 DIAGNOSIS — J4530 Mild persistent asthma, uncomplicated: Secondary | ICD-10-CM | POA: Diagnosis present

## 2021-01-27 DIAGNOSIS — D62 Acute posthemorrhagic anemia: Secondary | ICD-10-CM | POA: Diagnosis not present

## 2021-01-27 DIAGNOSIS — Z20822 Contact with and (suspected) exposure to covid-19: Secondary | ICD-10-CM | POA: Diagnosis present

## 2021-01-27 DIAGNOSIS — E785 Hyperlipidemia, unspecified: Secondary | ICD-10-CM | POA: Diagnosis present

## 2021-01-27 DIAGNOSIS — M65862 Other synovitis and tenosynovitis, left lower leg: Secondary | ICD-10-CM | POA: Diagnosis present

## 2021-01-27 DIAGNOSIS — F411 Generalized anxiety disorder: Secondary | ICD-10-CM | POA: Diagnosis present

## 2021-01-27 DIAGNOSIS — M1712 Unilateral primary osteoarthritis, left knee: Secondary | ICD-10-CM | POA: Diagnosis present

## 2021-01-27 DIAGNOSIS — Z8049 Family history of malignant neoplasm of other genital organs: Secondary | ICD-10-CM | POA: Diagnosis not present

## 2021-01-27 DIAGNOSIS — I959 Hypotension, unspecified: Secondary | ICD-10-CM | POA: Diagnosis not present

## 2021-01-27 DIAGNOSIS — I4891 Unspecified atrial fibrillation: Secondary | ICD-10-CM | POA: Diagnosis present

## 2021-01-27 DIAGNOSIS — E538 Deficiency of other specified B group vitamins: Secondary | ICD-10-CM | POA: Diagnosis present

## 2021-01-27 DIAGNOSIS — I10 Essential (primary) hypertension: Secondary | ICD-10-CM | POA: Diagnosis present

## 2021-01-27 DIAGNOSIS — M810 Age-related osteoporosis without current pathological fracture: Secondary | ICD-10-CM | POA: Diagnosis present

## 2021-01-27 DIAGNOSIS — K219 Gastro-esophageal reflux disease without esophagitis: Secondary | ICD-10-CM | POA: Diagnosis present

## 2021-01-27 LAB — CBC
HCT: 26.2 % — ABNORMAL LOW (ref 36.0–46.0)
Hemoglobin: 8.1 g/dL — ABNORMAL LOW (ref 12.0–15.0)
MCH: 30.8 pg (ref 26.0–34.0)
MCHC: 30.9 g/dL (ref 30.0–36.0)
MCV: 99.6 fL (ref 80.0–100.0)
Platelets: 194 10*3/uL (ref 150–400)
RBC: 2.63 MIL/uL — ABNORMAL LOW (ref 3.87–5.11)
RDW: 13.7 % (ref 11.5–15.5)
WBC: 8.8 10*3/uL (ref 4.0–10.5)
nRBC: 0 % (ref 0.0–0.2)

## 2021-01-27 MED ORDER — KETOROLAC TROMETHAMINE 15 MG/ML IJ SOLN
7.5000 mg | Freq: Four times a day (QID) | INTRAMUSCULAR | Status: AC
  Administered 2021-01-27 (×2): 7.5 mg via INTRAVENOUS
  Filled 2021-01-27 (×2): qty 1

## 2021-01-27 MED ORDER — SODIUM CHLORIDE 0.9 % IV BOLUS
500.0000 mL | Freq: Once | INTRAVENOUS | Status: AC
Start: 2021-01-27 — End: 2021-01-27
  Administered 2021-01-27: 500 mL via INTRAVENOUS

## 2021-01-27 NOTE — Progress Notes (Signed)
Physical Therapy Treatment Patient Details Name: Alexis Olson MRN: [id number] DOB: [birth year] Today's Date: 01/27/2021   History of Present Illness Pt is 87 yo female s/p L TKA 01/25/21. Hx of R TKA 2021, SAH, multiple falls, DVT.    PT Comments    Pt remains limited by low bp, lethargy, and pain with activity.  She was very lethargic this afternoon -falling asleep when not directly stimulated.  Still c/o pain at 7/10 even when falling asleep.   Bps this afternoon were: BP chair 89/52, after exerciss in chair 83/61, BP back at EOB 73/51, returned to supine 96/55 after 3 mins.  She required mod A of 2 for safety.  Continue to progress as able. May need to consider SNF if not progressing.    Recommendations for follow up therapy are one component of a multi-disciplinary discharge planning process, led by the attending physician.  Recommendations may be updated based on patient status, additional functional criteria and insurance authorization.  Follow Up Recommendations  Follow physician's recommendations for discharge plan and follow up therapies     Assistance Recommended at Discharge Frequent or constant Supervision/Assistance  Equipment Recommendations  None recommended by PT    Recommendations for Other Services       Precautions / Restrictions Precautions Precautions: Fall;Knee Precaution Comments: watch BP Restrictions LLE Weight Bearing: Weight bearing as tolerated Other Position/Activity Restrictions: WBAT     Mobility  Bed Mobility Overal bed mobility: Needs Assistance Bed Mobility: Sit to Supine     Supine to sit: Mod assist Sit to supine: Mod assist   General bed mobility comments: Assist for bil LE and turning    Transfers Overall transfer level: Needs assistance Equipment used: Rolling walker (2 wheels) Transfers: Sit to/from Stand Sit to Stand: Mod assist;+2 physical assistance           General transfer comment: MOd A x 2 from chair wih cues for  hand and foot placement    Ambulation/Gait Ambulation/Gait assistance: Mod assist;+2 physical assistance Gait Distance (Feet): 2 Feet Assistive device: Rolling walker (2 wheels) Gait Pattern/deviations: Step-to pattern;Decreased stride length;Decreased weight shift to left Gait velocity: decreased   General Gait Details: Pt only able to transfer 2' to bed requiring mod A x2 for RW and stability, cues for sequencing and RW use. Pt with min WBing on L leg due to pain.  LImited due to lethargy and low BP   Stairs             Wheelchair Mobility    Modified Rankin (Stroke Patients Only)       Balance Overall balance assessment: Needs assistance;History of Falls Sitting-balance support: No upper extremity supported Sitting balance-Leahy Scale: Fair     Standing balance support: Bilateral upper extremity supported Standing balance-Leahy Scale: Poor Standing balance comment: REquiring rW and assist                            Cognition Arousal/Alertness: Lethargic;Suspect due to medications Behavior During Therapy: [facility] for tasks assessed/performed Overall Cognitive Status: Impaired/Different from baseline Area of Impairment: Safety/judgement;Problem solving                         Safety/Judgement: Decreased awareness of safety   Problem Solving: Difficulty sequencing General Comments: REquiring cues for safety and sequencing - likely limited due to lethargy.  Pt falling asleep when not stimulated.        Exercises  Total Joint Exercises Ankle Circles/Pumps: AROM;Both;15 reps;Seated Quad Sets: AROM;Both;10 reps;Supine (very limited on L and with tactile cues) Hip ABduction/ADduction: AAROM;Left;10 reps Long Arc Quad: AAROM;Left;10 reps;Seated Knee Flexion: AAROM;Left;10 reps;Seated Goniometric ROM: ~5 to 60 degrees    General Comments General comments (skin integrity, edema, etc.): Pt extremely lethargic and wit soft BP that dropped in  standing.  Family present -again discussed balancing alertness/bp with pain control.  Explained limited session due to low bp and lethargy.  BP chair 89/52, after exerciss in chair 83/61, had nurse tech into assist with transfers, bp back at EOB 73/51, returned to supine 96/55 after 3 mins      Pertinent Vitals/Pain Pain Assessment: 0-10 Pain Score: 7  Faces Pain Scale: Hurts whole lot Pain Location: L knee Pain Descriptors / Indicators: Discomfort;Sore;Sharp;Grimacing Pain Intervention(s): Limited activity within patient's tolerance;Monitored during session;Premedicated before session;Repositioned;Ice applied (Painful with movement but falling asleep at rest)    Home Living                          Prior Function            PT Goals (current goals can now be found in the care plan section) Progress towards PT goals: Not progressing toward goals - comment (limited by bp and lethargy)    Frequency    7X/week      PT Plan Current plan remains appropriate    Co-evaluation              AM-PAC PT "6 Clicks" Mobility   Outcome Measure  Help needed turning from your back to your side while in a flat bed without using bedrails?: A Little Help needed moving from lying on your back to sitting on the side of a flat bed without using bedrails?: A Lot Help needed moving to and from a bed to a chair (including a wheelchair)?: Total Help needed standing up from a chair using your arms (e.g., wheelchair or bedside chair)?: Total Help needed to walk in hospital room?: Total Help needed climbing 3-5 steps with a railing? : Total 6 Click Score: 9    End of Session Equipment Utilized During Treatment: Gait belt Activity Tolerance: Patient limited by fatigue;Patient limited by pain;Treatment limited secondary to medical complications (Comment) Patient left: in bed;with call bell/phone within reach;with bed alarm set;in CPM Nurse Communication: Mobility status (BP) PT Visit  Diagnosis: Pain;Other abnormalities of gait and mobility (R26.89) Pain - Right/Left: Left Pain - part of body: Knee     Time: 7616-0737 PT Time Calculation (min) (ACUTE ONLY): 26 min  Charges:  $Therapeutic Exercise: 8-22 mins $Therapeutic Activity: 8-22 mins                     Abran Richard, PT Acute Rehab Services Pager [phone] [facility] [phone]    Karlton Lemon 01/27/2021, 2:56 PM

## 2021-01-27 NOTE — Progress Notes (Signed)
Physical Therapy Treatment Patient Details Name: Alexis Olson MRN: [id number] DOB: [birth year] Today's Date: 01/27/2021   History of Present Illness 87 yo female s/p L TKA 01/25/21. Hx of R TKA 2021, SAH, multiple falls, DVT.    PT Comments    Pt unable to progress today limited by pain and orthostatic BP.  BP as follows: Supine 96/45, Sit 101/72, post tx to chair 72/56, reclined and resting 3 mins 91/51. She only transferred to chair and participated with exercises. Pt reports increased pain, stated dilaudid worked much better - explained dilaudid can cause lethargy and decreased BP , so likely unable to get at this time.  Will f/u in afternoon as able.  May need to consider SNF if not progressing.     Recommendations for follow up therapy are one component of a multi-disciplinary discharge planning process, led by the attending physician.  Recommendations may be updated based on patient status, additional functional criteria and insurance authorization.  Follow Up Recommendations  Follow physician's recommendations for discharge plan and follow up therapies     Assistance Recommended at Discharge Frequent or constant Supervision/Assistance  Equipment Recommendations  None recommended by PT    Recommendations for Other Services       Precautions / Restrictions Precautions Precautions: Fall;Knee Precaution Comments: watch BP Restrictions Weight Bearing Restrictions: No LLE Weight Bearing: Weight bearing as tolerated     Mobility  Bed Mobility Overal bed mobility: Needs Assistance Bed Mobility: Supine to Sit     Supine to sit: Mod assist     General bed mobility comments: Assist for L LE and to lift trunk; cues for sequencing    Transfers Overall transfer level: Needs assistance Equipment used: Rolling walker (2 wheels) Transfers: Sit to/from Stand Sit to Stand: From elevated surface;Mod assist           General transfer comment: Required 2 attempts with cues for  hand placement, foot placement, and mod A to rise with increased time.  Cues and assist for controlled descent    Ambulation/Gait Ambulation/Gait assistance: Mod assist;Min assist Gait Distance (Feet): 3 Feet Assistive device: Rolling walker (2 wheels) Gait Pattern/deviations: Step-to pattern;Decreased stride length;Decreased weight shift to left Gait velocity: decreased   General Gait Details: Pt only able to transfer 3' to chair requiring min A for RW and stability, cues for sequencing and RW use, and mod A for 2 LOB.  Pt with min WBing on L leg due to pain.  Once pt in chair reassessed BP and was low - see below.   Stairs             Wheelchair Mobility    Modified Rankin (Stroke Patients Only)       Balance Overall balance assessment: Needs assistance;History of Falls Sitting-balance support: No upper extremity supported Sitting balance-Leahy Scale: Fair     Standing balance support: Bilateral upper extremity supported Standing balance-Leahy Scale: Poor                              Cognition Arousal/Alertness: Lethargic;Suspect due to medications Behavior During Therapy: [facility] for tasks assessed/performed Overall Cognitive Status: Within Functional Limits for tasks assessed                                 General Comments: Pt rambles at times and frequently switches topics of conversation, difficulty focusing, suspect medication related  Exercises Total Joint Exercises Ankle Circles/Pumps: AROM;Both;15 reps;Seated Quad Sets: AROM;Both;10 reps;Supine Long Arc Quad: AAROM;Left;10 reps;Seated Knee Flexion: AAROM;Left;10 reps;Seated Goniometric ROM: ~5 to 60 degrees    General Comments General comments (skin integrity, edema, etc.): Pt reports "they had my pain controlled last night."  Reports she received dilaudid.  Discussed need to balance alertness and BP control wih pain control and also weaning from IV meds.  Explained  dilaudid could be contributing to low BP and lethargy. Pt had received oral medication earlier this morning.  She is also itching - notified RN of itching and low BP.  BP as follows: Supine 96/45, Sit 101/72, post tx to chair 72/56, reclined and resting 3 mins 91/51.      Pertinent Vitals/Pain Pain Assessment: Faces Faces Pain Scale: Hurts whole lot Pain Location: L knee Pain Descriptors / Indicators: Discomfort;Sore;Sharp;Grimacing Pain Intervention(s): Limited activity within patient's tolerance;Monitored during session;Premedicated before session    Home Living                          Prior Function            PT Goals (current goals can now be found in the care plan section) Progress towards PT goals: Not progressing toward goals - comment (limited by pain and orthostatic)    Frequency    7X/week      PT Plan Current plan remains appropriate    Co-evaluation              AM-PAC PT "6 Clicks" Mobility   Outcome Measure  Help needed turning from your back to your side while in a flat bed without using bedrails?: A Little Help needed moving from lying on your back to sitting on the side of a flat bed without using bedrails?: A Lot Help needed moving to and from a bed to a chair (including a wheelchair)?: A Lot Help needed standing up from a chair using your arms (e.g., wheelchair or bedside chair)?: A Lot Help needed to walk in hospital room?: A Lot Help needed climbing 3-5 steps with a railing? : Total 6 Click Score: 12    End of Session Equipment Utilized During Treatment: Gait belt Activity Tolerance: Patient limited by fatigue;Patient limited by pain;Treatment limited secondary to medical complications (Comment) Patient left: in chair;with call bell/phone within reach;with chair alarm set Nurse Communication: Mobility status;Other (comment) (low bp) PT Visit Diagnosis: Pain;Other abnormalities of gait and mobility (R26.89) Pain - Right/Left:  Left Pain - part of body: Knee     Time: 1051-1120 PT Time Calculation (min) (ACUTE ONLY): 29 min  Charges:  $Therapeutic Exercise: 8-22 mins $Therapeutic Activity: 8-22 mins                     Abran Richard, PT Acute Rehab Services Pager [phone] [facility] [phone]    Karlton Lemon 01/27/2021, 11:49 AM

## 2021-01-27 NOTE — Progress Notes (Signed)
Orthopedic Tech Progress Note Patient Details:  [name] 05-Aug-1933 [id number]  Patient ID: Alexis Olson, female   DOB: [birth year], 87 y.o.   MRN: [id number] Pt is currently up in chair, will revisit after pt is back in bed following evening therapy session because CPM cannot be on a pt unless they are in bed.   Alexis Olson 01/27/2021, 1:31 PM

## 2021-01-27 NOTE — Progress Notes (Signed)
Orthopedic Tech Progress Note Patient Details:  Alexis Olson 23-Jun-1933 [id number]  Patient ID: [name], female   DOB: September 12, 1933, 87 y.o.   MRN: [id number] Will place pt in CPM after morning therapy session.  Vernona Rieger 01/27/2021, 11:43 AM

## 2021-01-27 NOTE — Plan of Care (Signed)
Plan of care reviewed and discussed with the patient. 

## 2021-01-27 NOTE — Plan of Care (Signed)
  Problem: Activity: Goal: Ability to avoid complications of mobility impairment will improve Outcome: Progressing Goal: Range of joint motion will improve Outcome: Progressing   Problem: Pain Management: Goal: Pain level will decrease with appropriate interventions Outcome: Progressing   

## 2021-01-27 NOTE — Progress Notes (Signed)
Orthopedic Tech Progress Note Patient Details:  Alexis Olson [facility] 08/02/1933 [id number]  CPM Left Knee CPM Left Knee: Off Left Knee Flexion (Degrees): 75 Left Knee Extension (Degrees): 0 Additional Comments: Tolerated well  Post Interventions Patient Tolerated: Fair Removed CPM @4 :25pm.  Vernona Rieger 01/27/2021, 4:33 PM

## 2021-01-27 NOTE — Progress Notes (Signed)
Subjective: 2 Days Post-Op Procedure(s) (LRB): TOTAL KNEE ARTHROPLASTY (Left) Patient reports pain as moderate.   Patient seen in rounds for Dr. Alvan Dame. Patient is well, and has had no acute complaints or problems other than pain in the knee. No acute events overnight. Voiding without difficult. Patient ambulated 15 feet with PT.  We will continue therapy today.   Objective: Vital signs in last 24 hours: Temp:  [98.5 F (36.9 C)-99 F (37.2 C)] 98.7 F (37.1 C) (11/03 0427) Pulse Rate:  [65-100] 100 (11/03 0427) Resp:  [16-18] 16 (11/03 0427) BP: (75-138)/(41-78) 106/51 (11/03 0427) SpO2:  [91 %-97 %] 91 % (11/03 0427)  Intake/Output from previous day:  Intake/Output Summary (Last 24 hours) at 01/27/2021 0727 Last data filed at 01/27/2021 0600 Gross per 24 hour  Intake 2229.7 ml  Output 2150 ml  Net 79.7 ml     Intake/Output this shift: No intake/output data recorded.  Labs: Recent Labs    01/26/21 0326 01/27/21 0327  HGB 8.7* 8.1*   Recent Labs    01/26/21 0326 01/27/21 0327  WBC 10.7* 8.8  RBC 2.80* 2.63*  HCT 27.8* 26.2*  PLT 214 194   Recent Labs    01/26/21 0326  NA 130*  K 4.5  CL 99  CO2 25  BUN 14  CREATININE 0.50  GLUCOSE 118*  CALCIUM 8.0*   No results for input(s): LABPT, INR in the last 72 hours.  Exam: General - Patient is Alert and Oriented Extremity - Neurologically intact Sensation intact distally Intact pulses distally Dorsiflexion/Plantar flexion intact Dressing - dressing C/D/I Motor Function - intact, moving foot and toes well on exam.   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 difficile diarrhea    Colon perforation ([place]) 08/2014   following polypetomy during colo, surgical repair   COLONIC POLYPS, ADENOMATOUS, HX OF  2010   DIVERTICULOSIS, COLON    DVT (deep venous thrombosis) ([place]) 2018   DVT (deep venous thrombosis) ([facility])    GERD (gastroesophageal reflux disease) 08/23/2015   Graded compression stocking in place    History of blood transfusion    History of migraine    History of rheumatic fever    HSV (herpes simplex virus) infection 08/16/2015   Hyperlipidemia    Hypertension    Denies   Hyponatremia 05/02/2016   Incomplete left bundle branch block (LBBB) 2016   Noted on EKG   INSOMNIA    Lumbosacral disc disease    Chronic pain; History of osteomyelitis 2010 following ESI complication   Obesity    Osteomyelitis (HCC)    Pelvic fracture (HCC)    Pneumonia    Recurrent UTI 08/23/2015   URINARY URGENCY    Ventral hernia without obstruction or gangrene 05/22/2018   VITAMIN B12 DEFICIENCY     Assessment/Plan: 2 Days Post-Op Procedure(s) (LRB): TOTAL KNEE ARTHROPLASTY (Left) Active Problems:   S/P total knee arthroplasty, left  Estimated body mass index is 29.56 kg/m as calculated from the following:   Height as of this encounter: 5\' 4"  (1.626 m).   Weight as of this encounter: 78.1 kg. Advance diet Up with therapy   Patient's anticipated LOS is less than 2 midnights, meeting these requirements: - Younger than  66 - Lives within 1 hour of care - Has a competent adult at home to recover with post-op recover - NO history of  - Chronic pain requiring opiods  - Diabetes  - Coronary Artery Disease  - Heart failure  - Heart attack  - Stroke  - DVT/VTE  - Cardiac arrhythmia  - Respiratory Failure/COPD  - Renal failure  - Anemia  - Advanced Liver disease   DVT Prophylaxis -  Eliquis 2.5 BID Weight bearing as tolerated.  Plan is to go Home after hospital stay. Patient had issues with pain control yesterday, and has been limited with PT due to pain and some lightheadedness. I will try another 2 doses of toradol for some relief, and she may utilize more Oxycodone if necessary.  Her  BP is still soft, and I will give another bolus today.   Will likely need to stay today as I feel her pain and limited progression with PT thus far is a safety risk for her at home at this point.  Griffith Citron, PA-C Orthopedic Surgery [phone] 01/27/2021, 7:27 AM

## 2021-01-28 ENCOUNTER — Other Ambulatory Visit (HOSPITAL_COMMUNITY): Payer: Self-pay

## 2021-01-28 LAB — CBC
HCT: 23 % — ABNORMAL LOW (ref 36.0–46.0)
Hemoglobin: 7.4 g/dL — ABNORMAL LOW (ref 12.0–15.0)
MCH: 31.6 pg (ref 26.0–34.0)
MCHC: 32.2 g/dL (ref 30.0–36.0)
MCV: 98.3 fL (ref 80.0–100.0)
Platelets: 203 10*3/uL (ref 150–400)
RBC: 2.34 MIL/uL — ABNORMAL LOW (ref 3.87–5.11)
RDW: 13.9 % (ref 11.5–15.5)
WBC: 8.8 10*3/uL (ref 4.0–10.5)
nRBC: 0 % (ref 0.0–0.2)

## 2021-01-28 LAB — PREPARE RBC (CROSSMATCH)

## 2021-01-28 MED ORDER — SODIUM CHLORIDE 0.9% IV SOLUTION: Freq: Once | INTRAVENOUS | Status: AC

## 2021-01-28 MED ORDER — SODIUM CHLORIDE 0.9 % IV BOLUS
250.0000 mL | Freq: Once | INTRAVENOUS | Status: AC
  Administered 2021-01-28: 250 mL via INTRAVENOUS

## 2021-01-28 NOTE — Progress Notes (Signed)
Physical Therapy Treatment Patient Details Name: Alexis Olson MRN: [id number] DOB: September 22, 1933 Today's Date: 01/28/2021   History of Present Illness Pt is 87 yo female s/p L TKA 01/25/21. Hx of R TKA 2021, SAH, multiple falls, DVT.    PT Comments    Pt is POD #3 s/p Lt TKA resulting in the deficits listed below (see PT Problem List). Pt continues to be limited by pain, lethargy, and hypotension/BP drops during session. Pt still requires MOD A +2 for performance and safety with transfers and ambulation at this time. PT reviewed LE circulation interventions, pt demonstrated understanding. Pt will benefit from skilled PT to maximize functional mobility to increase independence. May need to consider recommendation of SNF if pt unable to progress safely with mobility.   Vitals During session  Supine: 87/55mmHg , 106bpm Sitting: 118/11mmHg, 124bpm Immediately following standing: 104/85mmHg, 106bpm  End of session in recliner: 140/26mmHg, 100bpm    Recommendations for follow up therapy are one component of a multi-disciplinary discharge planning process, led by the attending physician.  Recommendations may be updated based on patient status, additional functional criteria and insurance authorization.  Follow Up Recommendations  Follow physician's recommendations for discharge plan and follow up therapies     Assistance Recommended at Discharge Frequent or constant Supervision/Assistance  Equipment Recommendations  None recommended by PT    Recommendations for Other Services       Precautions / Restrictions Precautions Precautions: Fall;Knee Precaution Comments: monitor BP Restrictions Weight Bearing Restrictions: No LLE Weight Bearing: Weight bearing as tolerated     Mobility  Bed Mobility Overal bed mobility: Needs Assistance Bed Mobility: Supine to Sit     Supine to sit: Mod assist;HOB elevated     General bed mobility comments: Assist for bil LE, pivoting hips to midline,  and scooting to EOB    Transfers Overall transfer level: Needs assistance Equipment used: Rolling walker (2 wheels) Transfers: Sit to/from Stand Sit to Stand: Mod assist;+2 physical assistance;+2 safety/equipment           General transfer comment: x2 from EOB; MOD A +2 for power up from EOB cues for hand and B foot placement. Pt with limited standing tolerance on 1st stand and noted to have increasing knee flexion requriign MOD A+2 to maintain standing balance and requesting to return to seated position. Pt able to stand and take side steps x4 to chair with MOD A+2 for stability, sequencing, and intermittent RW management/progression. Pt with reported dizziness during transfer and assisted to recliner. increased time for all mobility.    Ambulation/Gait                 Stairs             Wheelchair Mobility    Modified Rankin (Stroke Patients Only)       Balance Overall balance assessment: Needs assistance;History of Falls Sitting-balance support: No upper extremity supported Sitting balance-Leahy Scale: Fair     Standing balance support: Bilateral upper extremity supported Standing balance-Leahy Scale: Poor Standing balance comment: reliant on external support                            Cognition Arousal/Alertness: Lethargic Behavior During Therapy: WFL for tasks assessed/performed Overall Cognitive Status: Impaired/Different from baseline  General Comments: A&O x4, pt frequently repeating "you want me to stand up now?" while sitting EOB despite multiple cues for goal of sit to stand transfer. Pt reporting fatigue and seeming very tired/low energy throughout session        Exercises Total Joint Exercises Ankle Circles/Pumps: AROM;Both;20 reps;Seated    General Comments General comments (skin integrity, edema, etc.): Pt on 3L [place] upon entry with O2 sat reading 100%, weaned to 2L [place] and O2 sats  remained >90% throughout session, RN notified pt left on 2L [place] with O2 sat at 96% end of session.      Pertinent Vitals/Pain Pain Assessment: Faces Faces Pain Scale: Hurts even more Pain Location: L knee Pain Descriptors / Indicators: Discomfort;Grimacing Pain Intervention(s): Limited activity within patient's tolerance;Monitored during session;Repositioned;Premedicated before session;Ice applied    Home Living                          Prior Function            PT Goals (current goals can now be found in the care plan section) Acute Rehab PT Goals Patient Stated Goal: regain independence PT Goal Formulation: With patient Time For Goal Achievement: 02/08/21 Potential to Achieve Goals: Good Progress towards PT goals: Progressing toward goals    Frequency    7X/week      PT Plan Current plan remains appropriate    Co-evaluation              AM-PAC PT "6 Clicks" Mobility   Outcome Measure  Help needed turning from your back to your side while in a flat bed without using bedrails?: A Little Help needed moving from lying on your back to sitting on the side of a flat bed without using bedrails?: A Lot Help needed moving to and from a bed to a chair (including a wheelchair)?: Total Help needed standing up from a chair using your arms (e.g., wheelchair or bedside chair)?: Total Help needed to walk in hospital room?: Total Help needed climbing 3-5 steps with a railing? : Total 6 Click Score: 9    End of Session Equipment Utilized During Treatment: Gait belt;Oxygen Activity Tolerance: Patient limited by fatigue;Patient limited by pain;Treatment limited secondary to medical complications (Comment) (BP drops with mobility and onset of dizziness) Patient left: in chair;with call bell/phone within reach;with chair alarm set;with family/visitor present (with hospital staff in room (lab in to draw blood)) Nurse Communication: Mobility status (Pt vitals) PT Visit  Diagnosis: Pain;Other abnormalities of gait and mobility (R26.89) Pain - Right/Left: Left Pain - part of body: Knee     Time: 7262-0355 PT Time Calculation (min) (ACUTE ONLY): 38 min  Charges:  $Therapeutic Activity: 38-52 mins                     Festus Barren PT, DPT  Acute Rehabilitation Services  Office [phone]   01/28/2021, 12:00 PM

## 2021-01-28 NOTE — Plan of Care (Signed)
°  Problem: Education: °Goal: Knowledge of the prescribed therapeutic regimen will improve °Outcome: Progressing °  °Problem: Clinical Measurements: °Goal: Postoperative complications will be avoided or minimized °Outcome: Progressing °  °Problem: Pain Management: °Goal: Pain level will decrease with appropriate interventions °Outcome: Progressing °  °

## 2021-01-28 NOTE — Plan of Care (Signed)
Plan of care reviewed and discussed. °

## 2021-01-28 NOTE — Progress Notes (Signed)
Physical Therapy Treatment Patient Details Name: Alexis Olson MRN: [id number] DOB: 24-Oct-1933 Today's Date: 01/28/2021   History of Present Illness Pt is 87 yo female s/p L TKA 01/25/21. Hx of R TKA 2021, SAH, multiple falls, DVT.    PT Comments    Pt is POD #3 s/p Lt TKA resulting in the deficits listed below (see PT Problem List). PT returning this afternoon for review of techniques to assist pt with mobility and safe performance of transfer back to bed. Pt remains limited with progression toward acute PT goals due to increased pain levels and reported feelings of fatigue limiting functional mobility. Pt receiving RBC transfusion this evening denied any reports of dizziness during afternoon session. PT reviewed LE HEP for promotion of DVT prevention and improved ROM, pt demonstrated understanding. Educated pt and family on performance of these interventions outside of therapy sessions. Pt is currently NOT at a safe mobility level for d/c and will benefit from skilled PT to maximize functional mobility to increase independence. May need to consider recommendation to SNF if unable to progress safely with mobility.     Recommendations for follow up therapy are one component of a multi-disciplinary discharge planning process, led by the attending physician.  Recommendations may be updated based on patient status, additional functional criteria and insurance authorization.  Follow Up Recommendations  Follow physician's recommendations for discharge plan and follow up therapies     Assistance Recommended at Discharge Frequent or constant Supervision/Assistance  Equipment Recommendations  None recommended by PT    Recommendations for Other Services       Precautions / Restrictions Precautions Precautions: Fall;Knee Precaution Comments: monitor BP Restrictions Weight Bearing Restrictions: No LLE Weight Bearing: Weight bearing as tolerated     Mobility  Bed Mobility           Sit to  supine: Max assist;+2 for physical assistance   General bed mobility comments: OOB in recliner upon entry. MAX A +2 for controlled descent of trunk and to bring B LEs onto bed. Pt able to use Rt LE to perform modified bridge and reposition hips while supine with MIN A+2 with use of chuck pad for completion for full midline position in bed.    Transfers Overall transfer level: Needs assistance Equipment used: Rolling walker (2 wheels) Transfers: Sit to/from Stand Sit to Stand: Mod assist;Max assist;+2 physical assistance           General transfer comment: x2 from recliner. MOD-MAX A+2 for power up with cues for hand and using UEs to assist with knee flexion for improved Lt foot placement, reporting increased stiffness. Pt with limited standing tolerance on 1st stand due to increased pain levels with diffiuclty achieveing full upright and requesting to return to seated position.  on 2nd trial pt performing side steps toward EOB with cues for sequencing. Pt noted to have increasing knee flexion requiring MOD A +2 progressing to MAX A to maintain standing balance and unable to complete further steps to bed requiring bed to be unlocked and moved directly behind pt to allow her to sit for fall prevention. Pt denied any onset of dizziness during transfer. BP 142/79 sitting EOB following transfer. HR 122bpm. increased time for all mobility.    Ambulation/Gait                 Stairs             Wheelchair Mobility    Modified Rankin (Stroke Patients Only)  Balance Overall balance assessment: Needs assistance;History of Falls Sitting-balance support: No upper extremity supported;Feet supported Sitting balance-Leahy Scale: Fair     Standing balance support: Bilateral upper extremity supported Standing balance-Leahy Scale: Poor Standing balance comment: reliant on external support                            Cognition Arousal/Alertness: Awake/alert Behavior  During Therapy: WFL for tasks assessed/performed                                   General Comments: Pt with improved command following and increased arousal this afternoon        Exercises      General Comments        Pertinent Vitals/Pain Pain Assessment: Faces Faces Pain Scale: Hurts whole lot Pain Location: L knee Pain Descriptors / Indicators: Discomfort;Grimacing;Moaning Pain Intervention(s): Limited activity within patient's tolerance;Monitored during session;Repositioned    Home Living                          Prior Function            PT Goals (current goals can now be found in the care plan section) Acute Rehab PT Goals Patient Stated Goal: regain independence PT Goal Formulation: With patient Time For Goal Achievement: 02/08/21 Potential to Achieve Goals: Good Progress towards PT goals: Not progressing toward goals - comment (limited by pain this session)    Frequency    7X/week      PT Plan Current plan remains appropriate    Co-evaluation              AM-PAC PT "6 Clicks" Mobility   Outcome Measure  Help needed turning from your back to your side while in a flat bed without using bedrails?: A Little Help needed moving from lying on your back to sitting on the side of a flat bed without using bedrails?: A Lot Help needed moving to and from a bed to a chair (including a wheelchair)?: Total Help needed standing up from a chair using your arms (e.g., wheelchair or bedside chair)?: Total Help needed to walk in hospital room?: Total Help needed climbing 3-5 steps with a railing? : Total 6 Click Score: 9    End of Session Equipment Utilized During Treatment: Gait belt Activity Tolerance: Patient limited by fatigue;Patient limited by pain Patient left: in bed;with call bell/phone within reach;with family/visitor present Nurse Communication: Mobility status (present throughout session to assist) PT Visit Diagnosis:  Pain;Other abnormalities of gait and mobility (R26.89);Difficulty in walking, not elsewhere classified (R26.2) Pain - Right/Left: Left Pain - part of body: Knee     Time: 0347-4259 PT Time Calculation (min) (ACUTE ONLY): 20 min  Charges:  $Therapeutic Activity: 8-22 mins                     Festus Barren PT, DPT  Acute Rehabilitation Services  Office [phone]  01/28/2021, 4:27 PM

## 2021-01-28 NOTE — Plan of Care (Signed)
  Problem: Education: Goal: Knowledge of the prescribed therapeutic regimen will improve Outcome: Progressing   Problem: Pain Management: Goal: Pain level will decrease with appropriate interventions Outcome: Progressing   Problem: Skin Integrity: Goal: Will show signs of wound healing Outcome: Progressing   

## 2021-01-28 NOTE — Progress Notes (Signed)
   01/28/21 1708  Assess: MEWS Score  Temp 99.7 F (37.6 C)  BP 138/65  Pulse Rate (!) 119  Resp 16  Assess: MEWS Score  MEWS Temp 0  MEWS Systolic 0  MEWS Pulse 2  MEWS RR 0  MEWS LOC 0  MEWS Score 2  MEWS Score Color Yellow  Assess: if the MEWS score is Yellow or Red  Were vital signs taken at a resting state? Yes  Focused Assessment No change from prior assessment  MEWS guidelines implemented *See Row Information* No, vital signs rechecked  Treat  MEWS Interventions Other (Comment)  Take Vital Signs  Increase Vital Sign Frequency  Yellow: Q 2hr X 2 then Q 4hr X 2, if remains yellow, continue Q 4hrs  Escalate  MEWS: Escalate Yellow: discuss with charge nurse/RN and consider discussing with provider and RRT  Notify: Charge Nurse/RN  Name of Charge Nurse/RN Notified JT  Date Charge Nurse/RN Notified 01/28/21  Time Charge Nurse/RN Notified 1719  Document  Progress note created (see row info) Yes  Assess: SIRS CRITERIA  SIRS Temperature  0  SIRS Pulse 1  SIRS Respirations  0  SIRS WBC 0  SIRS Score Sum  1

## 2021-01-28 NOTE — Progress Notes (Signed)
Subjective: 3 Days Post-Op Procedure(s) (LRB): TOTAL KNEE ARTHROPLASTY (Left) Patient reports pain as moderate.   Patient seen in rounds for Dr. Alvan Dame. Patient is well, and has had no acute complaints or problems. No acute events overnight. Voiding without difficulty via purewick. Patient ambulated 2 feet with PT. She has been limited by lightheadedness and hypotension We will continue therapy today.   Objective: Vital signs in last 24 hours: Temp:  [97.8 F (36.6 C)-99.4 F (37.4 C)] 99.4 F (37.4 C) (11/04 0513) Pulse Rate:  [96-100] 100 (11/04 0513) Resp:  [16-18] 16 (11/04 0513) BP: (95-108)/(43-73) 102/43 (11/04 0513) SpO2:  [87 %-94 %] 87 % (11/04 0849)  Intake/Output from previous day:  Intake/Output Summary (Last 24 hours) at 01/28/2021 0939 Last data filed at 01/28/2021 0923 Gross per 24 hour  Intake 958.49 ml  Output 1900 ml  Net -941.51 ml     Intake/Output this shift: Total I/O In: -  Out: 400 [Urine:400]  Labs: Recent Labs    01/26/21 0326 01/27/21 0327 01/28/21 0735  HGB 8.7* 8.1* 7.4*   Recent Labs    01/27/21 0327 01/28/21 0735  WBC 8.8 8.8  RBC 2.63* 2.34*  HCT 26.2* 23.0*  PLT 194 203   Recent Labs    01/26/21 0326  NA 130*  K 4.5  CL 99  CO2 25  BUN 14  CREATININE 0.50  GLUCOSE 118*  CALCIUM 8.0*   No results for input(s): LABPT, INR in the last 72 hours.  Exam: General - Patient is Alert and Oriented Extremity - Neurologically intact Sensation intact distally Intact pulses distally Dorsiflexion/Plantar flexion intact Compartments soft  Dressing - dressing C/D/I, no evidence of bleeding or effusion. Motor Function - intact, moving foot and toes well on exam.   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 difficile diarrhea     Colon perforation ([place]) 08/2014   following polypetomy during colo, surgical repair   COLONIC POLYPS, ADENOMATOUS, HX OF 2010   DIVERTICULOSIS, COLON    DVT (deep venous thrombosis) ([place]) 2018   DVT (deep venous thrombosis) ([facility])    GERD (gastroesophageal reflux disease) 08/23/2015   Graded compression stocking in place    History of blood transfusion    History of migraine    History of rheumatic fever    HSV (herpes simplex virus) infection 08/16/2015   Hyperlipidemia    Hypertension    Denies   Hyponatremia 05/02/2016   Incomplete left bundle branch block (LBBB) 2016   Noted on EKG   INSOMNIA    Lumbosacral disc disease    Chronic pain; History of osteomyelitis 2010 following ESI complication   Obesity    Osteomyelitis ([facility])    Pelvic fracture ([facility])    Pneumonia    Recurrent UTI 08/23/2015   URINARY URGENCY    Ventral hernia without obstruction or gangrene 05/22/2018   VITAMIN B12 DEFICIENCY     Assessment/Plan: 3 Days Post-Op Procedure(s) (LRB): TOTAL KNEE ARTHROPLASTY (Left) Active Problems:   S/P total knee arthroplasty, left  Estimated body mass index is 29.56 kg/m as calculated from the following:   Height as of this encounter: 5\' 4"  (1.626 m).   Weight as of this encounter: 78.1 kg. Advance diet  Up with therapy  Anticipated LOS equal to or greater than 2 midnights due to - Age 85 and older with one or more of the following:  - Obesity  - Expected need for hospital services (PT, OT, Nursing) required for safe  discharge  - Anticipated need for postoperative skilled nursing care or inpatient rehab  - Active co-morbidities: Cardiac Arrhythmia and Anemia OR   - Unanticipated findings during/Post Surgery: Slow post-op progression: GI, pain control, mobility  - Patient is a high risk of re-admission due to: None   DVT Prophylaxis -  Eliquis 2.5 BID Weight bearing as tolerated.  ABLA: Hemoglobin 7.4 this AM, down from 11.0 pre-operatively. She has no signs of  bleeding from the surgical site. She denies vomiting, diarrhea, abdominal pain, chest pain. No hematuria. Although she is above the threshold of transfusion typically, she has been limited by symptomatic hypotension during this stay. Requires metoprolol for rate control with a fib, otherwise not on anti-hypertensives.  I will discuss with Dr. Alvan Dame, but feel she will likely need transfusion today. She will need to remain inpatient for now for continued monitoring and progression with PT.  Continue working with therapy as able. Will give additional fluid bolus for now.     Griffith Citron, PA-C Orthopedic Surgery [phone] 01/28/2021, 9:39 AM

## 2021-01-29 ENCOUNTER — Other Ambulatory Visit (HOSPITAL_COMMUNITY): Payer: Self-pay

## 2021-01-29 LAB — HEMOGLOBIN AND HEMATOCRIT, BLOOD
HCT: 26.7 % — ABNORMAL LOW (ref 36.0–46.0)
Hemoglobin: 8.7 g/dL — ABNORMAL LOW (ref 12.0–15.0)

## 2021-01-29 NOTE — Plan of Care (Signed)
  Problem: Education: Goal: Knowledge of the prescribed therapeutic regimen will improve Outcome: Progressing   Problem: Clinical Measurements: Goal: Postoperative complications will be avoided or minimized Outcome: Progressing   Problem: Pain Management: Goal: Pain level will decrease with appropriate interventions Outcome: Progressing   Problem: Pain Managment: Goal: General experience of comfort will improve Outcome: Progressing   Problem: Safety: Goal: Ability to remain free from injury will improve Outcome: Progressing

## 2021-01-29 NOTE — Progress Notes (Signed)
Physical Therapy Treatment Patient Details Name: Alexis Olson MRN: [id number] DOB: 07-May-1933 Today's Date: 01/29/2021   History of Present Illness Pt is 87 yo female s/p L TKA 01/25/21. Hx of R TKA 2021, SAH, multiple falls, DVT.    PT Comments    Pt limited by lethargy this session requiring repeated auditory and tactile stimuli for arousal. Performed supine to sit with Total A+2 due to pt's decreased command following in attempt to improve arousal, but pt repeatedly falling asleep while sitting EOB and when attempting to initiate mobility tasks. Transfers and ambulation attempts deferred this session to maintain pt, therapist, and rehab tech safety. Pt is currently NOT at mobility level that is safe for d/c to home. May need to consider recommendation to SNF if unable to progress safely with mobility. Pt will benefit from continued skilled PT to increase independence and maximize safety with mobility.    Vitals during session  supine: 124/68,, 86bpm  EOB: 107/72, 97bpm  Supine at EOS: 121/69, 88bpm      Recommendations for follow up therapy are one component of a multi-disciplinary discharge planning process, led by the attending physician.  Recommendations may be updated based on patient status, additional functional criteria and insurance authorization.  Follow Up Recommendations  Follow physician's recommendations for discharge plan and follow up therapies     Assistance Recommended at Discharge Frequent or constant Supervision/Assistance  Equipment Recommendations  None recommended by PT    Recommendations for Other Services       Precautions / Restrictions Precautions Precautions: Fall;Knee Restrictions Weight Bearing Restrictions: No LLE Weight Bearing: Weight bearing as tolerated     Mobility  Bed Mobility Overal bed mobility: Needs Assistance Bed Mobility: Supine to Sit;Sit to Supine     Supine to sit: Total assist;+2 for physical assistance;HOB elevated Sit  to supine: Max assist;+2 for physical assistance;HOB elevated   General bed mobility comments: pt very lethargic today and with difficulty following commands. Attempted to perform AROM/PROM while supine, turning on lights, opening windows and elevating HOB in attempts to increase alertness, but pt falling asleep after verbal/tactile cuing and guarding against PROM of L LE. Performed supine to sit with total A+2 due to pt's decrease command following in attempts to increase arousal. Pt initally more alert/awake while sitting EOB with +2 assist, able to maintain seated balance following cues for hand placement. Attempted seated LAQ, and lateral scooting to assess command following and arousal levels. Pt with initiation of body positiong to perform taks, but quickly closing eyes again while EOB despite verbal and tactile cuing/stimulation. Further mobility deferred to maintain safety. RN notified.    Transfers                   General transfer comment: deferred due to lethargy    Ambulation/Gait                 Stairs             Wheelchair Mobility    Modified Rankin (Stroke Patients Only)       Balance Overall balance assessment: Needs assistance;History of Falls Sitting-balance support: Bilateral upper extremity supported;Feet supported Sitting balance-Leahy Scale: Fair                                      Cognition Arousal/Alertness: Lethargic Behavior During Therapy: WFL for tasks assessed/performed Overall Cognitive Status: Impaired/Different from baseline Area  of Impairment: Following commands                               General Comments: pt very lethargic upon entry requiring repeated auditory stimulus, sternal rubbing, and pressure of great toe, still not opening eyes fully and quickly falling back to sleep. Intermittently responding with 1 word/short phrase answers. Pt initially unable to remember yr she was born, but able  to verbalize later in session when asked again        Exercises Total Joint Exercises Ankle Circles/Pumps: AROM;Both;10 reps;Supine    General Comments        Pertinent Vitals/Pain Pain Assessment: Faces Faces Pain Scale: Hurts even more Pain Location: L knee Pain Descriptors / Indicators: Grimacing;Moaning Pain Intervention(s): Limited activity within patient's tolerance;Monitored during session;Repositioned;Premedicated before session;Ice applied    Home Living                          Prior Function            PT Goals (current goals can now be found in the care plan section) Acute Rehab PT Goals Patient Stated Goal: regain independence PT Goal Formulation: With patient Time For Goal Achievement: 02/08/21 Potential to Achieve Goals: Good Progress towards PT goals: Not progressing toward goals - comment;PT to reassess next treatment (limited by lethargy this session)    Frequency    7X/week      PT Plan Current plan remains appropriate    Co-evaluation              AM-PAC PT "6 Clicks" Mobility   Outcome Measure  Help needed turning from your back to your side while in a flat bed without using bedrails?: A Little Help needed moving from lying on your back to sitting on the side of a flat bed without using bedrails?: A Lot Help needed moving to and from a bed to a chair (including a wheelchair)?: Total Help needed standing up from a chair using your arms (e.g., wheelchair or bedside chair)?: Total Help needed to walk in hospital room?: Total Help needed climbing 3-5 steps with a railing? : Total 6 Click Score: 9    End of Session   Activity Tolerance: Patient limited by lethargy Patient left: in bed;with call bell/phone within reach;with family/visitor present;with bed alarm set Nurse Communication: Mobility status PT Visit Diagnosis: Pain;Other abnormalities of gait and mobility (R26.89);Difficulty in walking, not elsewhere classified  (R26.2) Pain - Right/Left: Left Pain - part of body: Knee     Time: 0940-1008 PT Time Calculation (min) (ACUTE ONLY): 28 min  Charges:  $Therapeutic Activity: 23-37 mins                     Festus Barren PT, DPT  Acute Rehabilitation Services  Office [phone]   01/29/2021, 12:25 PM

## 2021-01-29 NOTE — Plan of Care (Signed)
  Problem: Education: Goal: Knowledge of the prescribed therapeutic regimen will improve Outcome: Progressing   Problem: Activity: Goal: Ability to avoid complications of mobility impairment will improve Outcome: Progressing   Problem: Pain Management: Goal: Pain level will decrease with appropriate interventions Outcome: Progressing   

## 2021-01-29 NOTE — Progress Notes (Signed)
Subjective: 4 Days Post-Op Procedure(s) (LRB): TOTAL KNEE ARTHROPLASTY (Left)  Patient reports pain as mild to moderate. Denies fever, chills, N/V, CP, SOB.  Tolerating POs well.  Admits to flatus.  Received 2 units PRBCs yesterday.  Notes that she feels tired and lethargic this am.  Objective:   VITALS:  Temp:  [97.9 F (36.6 C)-100.6 F (38.1 C)] 99 F (37.2 C) (11/05 0600) Pulse Rate:  [51-119] 103 (11/05 0600) Resp:  [16-18] 16 (11/05 0600) BP: (99-153)/(55-85) 153/72 (11/05 0600) SpO2:  [87 %-97 %] 94 % (11/05 0607)  General: WDWN patient in NAD. Psych:  Appropriate mood and affect. Neuro:  A&O x 3, Moving all extremities, sensation intact to light touch HEENT:  EOMs intact Chest:  Even non-labored respirations Skin:  Dressing C/D/I, no rashes or lesions Extremities: warm/dry, mild edema to L knee, no erythema or echymosis.  No lymphadenopathy. Pulses: Popliteus 2+ MSK:  ROM: lacks 5 degrees TKE, MMT: able to perform quad set, (-) Homan's    LABS Recent Labs    01/27/21 0327 01/28/21 0735 01/29/21 0012  HGB 8.1* 7.4* 8.7*  WBC 8.8 8.8  --   PLT 194 203  --    No results for input(s): NA, K, CL, CO2, BUN, CREATININE, GLUCOSE in the last 72 hours. No results for input(s): LABPT, INR in the last 72 hours.   Assessment/Plan: 4 Days Post-Op Procedure(s) (LRB): TOTAL KNEE ARTHROPLASTY (Left)  Patient seen in rounds for Dr. Alvan Dame Hgb up to 8.7 this am.   WBAT L LE Up with therapy DVT ppx:  Eliquis Patient still lethargic this morning.  May D/C home later today or tomorrow if patient starts to feel better and meeting goals.  Mechele Claude PA-C [facility] Office:  [phone]

## 2021-01-30 ENCOUNTER — Inpatient Hospital Stay (HOSPITAL_COMMUNITY): Payer: PPO

## 2021-01-30 MED ORDER — MAGNESIUM HYDROXIDE 400 MG/5ML PO SUSP
30.0000 mL | Freq: Every day | ORAL | Status: DC | PRN
  Administered 2021-01-30: 30 mL via ORAL
  Filled 2021-01-30: qty 30

## 2021-01-30 MED ORDER — SORBITOL 70 % SOLN
400.0000 mL | TOPICAL_OIL | Freq: Once | ORAL | Status: DC
  Filled 2021-01-30: qty 120

## 2021-01-30 NOTE — Plan of Care (Signed)
  Problem: Education: Goal: Knowledge of the prescribed therapeutic regimen will improve Outcome: Progressing   Problem: Activity: Goal: Ability to avoid complications of mobility impairment will improve Outcome: Progressing   Problem: Pain Management: Goal: Pain level will decrease with appropriate interventions Outcome: Progressing   

## 2021-01-30 NOTE — Progress Notes (Addendum)
Physical Therapy Treatment Patient Details Name: Alexis Olson MRN: [id number] DOB: [birth year] Today's Date: 01/30/2021   History of Present Illness Pt is 87 yo female s/p L TKA 01/25/21. Hx of R TKA 2021, SAH, multiple falls, DVT.    PT Comments    POD5, pt very limited with mobility, still slightly lethargic and high pain this session. Pt requires +2 with all mobility due to weakness and pain. Pt able to inch BLE over towards EOB but ultimately requires +2 for trunk and BLE management. Pt able to stand for limited period of time, sildes each foot up towards [facility] and unable to clear either foot from floor, assist with RW to maneuver and heavy VC for weight-shift anterior. No L knee buckling noted, but pt verbalizes strong fear of falling with mobility. Pt's BP negative for orthostatics this session, pt denies dizziness with mobility.  Though pt more conversational this session, still very limited and progressing very slowly due to lethargy and pain. May need to consider SNF rehab prior to return home due to continued +2 assistance needed with mobility and limited steps using RW.   Recommendations for follow up therapy are one component of a multi-disciplinary discharge planning process, led by the attending physician.  Recommendations may be updated based on patient status, additional functional criteria and insurance authorization.  Follow Up Recommendations  Skilled nursing-short term rehab (<3 hours/day)     Assistance Recommended at Discharge Frequent or constant Supervision/Assistance  Equipment Recommendations  None recommended by PT    Recommendations for Other Services       Precautions / Restrictions Precautions Precautions: Fall;Knee Restrictions Weight Bearing Restrictions: No LLE Weight Bearing: Weight bearing as tolerated     Mobility  Bed Mobility Overal bed mobility: Needs Assistance Bed Mobility: Supine to Sit;Sit to Supine  Supine to sit: Mod assist;+2 for  physical assistance;HOB elevated Sit to supine: Mod assist;+2 for physical assistance   General bed mobility comments: able to inch RLE over towards EOB and reach for bedrail, ultimately requires mod A +2 for trunk and LLE management, slow to mobilize secondary to pain    Transfers Overall transfer level: Needs assistance Equipment used: Rolling walker (2 wheels) Transfers: Sit to/from Stand Sit to Stand: Mod assist;+2 physical assistance  General transfer comment: VC for hand placement, braces BLE against front of bed with weight posterior requiring heavy assist to shift weight anterior and pushing RW slightly away to encourage upright standing, generally unsteady and verbalizes fear of falling, no L knee buckling noted    Ambulation/Gait Ambulation/Gait assistance: Mod assist;+2 physical assistance  Assistive device: Rolling walker (2 wheels)  Gait velocity: decreased  General Gait Details: pt slides each foot up towards [place] with sidesteps at bedside, unable to clear either foot from floor, heavy use of BUE on RW, no L knee buckling noted but pt reports minimal WB   Stairs             Wheelchair Mobility    Modified Rankin (Stroke Patients Only)       Balance Overall balance assessment: Needs assistance;History of Falls Sitting-balance support: Bilateral upper extremity supported;Feet supported Sitting balance-Leahy Scale: Fair  Standing balance support: No upper extremity supported;During functional activity;Reliant on assistive device for balance Standing balance-Leahy Scale: Poor Standing balance comment: reliant on external support     Cognition Arousal/Alertness: Lethargic;Awake/alert Behavior During Therapy: WFL for tasks assessed/performed Overall Cognitive Status: Within Functional Limits for tasks assessed  General Comments: pt slightly lethargic, eyes open throughout  session, able to follow commands appropriately with increased time, reports feeling "so  tired", conversational throughout session        Exercises      General Comments        Pertinent Vitals/Pain Pain Assessment: 0-10 Pain Score: 10-Worst pain ever Pain Location: L knee Pain Descriptors / Indicators: Grimacing;Moaning;Tender;Sore Pain Intervention(s): Limited activity within patient's tolerance;Monitored during session;Repositioned;Ice applied;Patient requesting pain meds-RN notified    Home Living                          Prior Function            PT Goals (current goals can now be found in the care plan section) Acute Rehab PT Goals Patient Stated Goal: regain independence PT Goal Formulation: With patient Time For Goal Achievement: 02/08/21 Potential to Achieve Goals: Fair Progress towards PT goals: Not progressing toward goals - comment (very slowly, limited by lethargy and pain)    Frequency    7X/week      PT Plan Discharge plan needs to be updated    Co-evaluation              AM-PAC PT "6 Clicks" Mobility   Outcome Measure  Help needed turning from your back to your side while in a flat bed without using bedrails?: A Little Help needed moving from lying on your back to sitting on the side of a flat bed without using bedrails?: A Lot Help needed moving to and from a bed to a chair (including a wheelchair)?: Total Help needed standing up from a chair using your arms (e.g., wheelchair or bedside chair)?: A Lot Help needed to walk in hospital room?: Total Help needed climbing 3-5 steps with a railing? : Total 6 Click Score: 10    End of Session Equipment Utilized During Treatment: Gait belt Activity Tolerance: Patient limited by lethargy;No increased pain Patient left: in bed;with call bell/phone within reach;with bed alarm set Nurse Communication: Mobility status;Patient requests pain meds PT Visit Diagnosis: Pain;Other abnormalities of gait and mobility (R26.89);Difficulty in walking, not elsewhere classified  (R26.2) Pain - Right/Left: Left Pain - part of body: Knee     Time: 3875-6433 PT Time Calculation (min) (ACUTE ONLY): 24 min  Charges:  $Therapeutic Activity: 23-37 mins                      Tori Marrell Dicaprio PT, DPT 01/30/21, 12:29 PM

## 2021-01-31 ENCOUNTER — Other Ambulatory Visit (HOSPITAL_COMMUNITY): Payer: Self-pay

## 2021-01-31 LAB — BPAM RBC
Blood Product Expiration Date: 202211292359
Blood Product Expiration Date: 202211292359
ISSUE DATE / TIME: 202211041310
ISSUE DATE / TIME: 202211041640
Unit Type and Rh: 600
Unit Type and Rh: 600

## 2021-01-31 LAB — TYPE AND SCREEN
ABO/RH(D): A NEG
Antibody Screen: NEGATIVE
Unit division: 0
Unit division: 0

## 2021-01-31 MED ORDER — FLEET ENEMA 7-19 GM/118ML RE ENEM: 1.0000 | ENEMA | Freq: Every day | RECTAL | Status: DC | PRN

## 2021-01-31 NOTE — Progress Notes (Signed)
Physical Therapy Treatment Patient Details Name: Alexis Olson MRN: [id number] DOB: Sep 14, 1933 Today's Date: 01/31/2021   History of Present Illness Pt is 87 yo female s/p L TKA 01/25/21. Hx of R TKA 2021, SAH, multiple falls, DVT.    PT Comments     Dr Aurea Graff documentation of today is noted. Pt is familiar to this PT from her last TKA.  With maximum encouragement pt is able to amb ~ 14' with RW and min assist. She continues to require mod assist +2 for bed mobility, will need to work on this. Pt needs STAY OFF the [place] and mobilize more during the day . I have advised pt and son of this as well as nursing staff. She has to move more than with just PT to be able to d/c home.  Pt repeated states she is tired, have discussed with pt this deconditioning has been exacerbated by recent lack of participation/mobility. Will continue to follow in acute setting, planning for home per Dr Alvan Dame.  Recommendations for follow up therapy are one component of a multi-disciplinary discharge planning process, led by the attending physician.  Recommendations may be updated based on patient status, additional functional criteria and insurance authorization.  Follow Up Recommendations  Home health PT     Assistance Recommended at Discharge Frequent or constant Supervision/Assistance  Equipment Recommendations  None recommended by PT    Recommendations for Other Services       Precautions / Restrictions Precautions Precautions: Fall;Knee Restrictions Weight Bearing Restrictions: No LLE Weight Bearing: Weight bearing as tolerated     Mobility  Bed Mobility Overal bed mobility: Needs Assistance Bed Mobility: Supine to Sit     Supine to sit: Min assist;Mod assist;+2 for safety/equipment;+2 for physical assistance     General bed mobility comments: able to inch RLE over towards EOB and reach for bedrail, ultimately requires mod A +2 for trunk and LLE. incr time, max verbal cues and encouragement  for incr pt participation    Transfers Overall transfer level: Needs assistance Equipment used: Rolling walker (2 wheels) Transfers: Sit to/from Stand Sit to Stand: Min assist;+2 safety/equipment           General transfer comment: multi-modal cues for hand placement and LE position    Ambulation/Gait Ambulation/Gait assistance: Min assist Gait Distance (Feet): 19 Feet Assistive device: Rolling walker (2 wheels) Gait Pattern/deviations: Step-to pattern;Decreased stride length;Decreased weight shift to left;Narrow base of support Gait velocity: decreased     General Gait Details: verbal cues for sequence, step length, incr BOS and for participation. min assist overall for balance and safety.   Stairs             Wheelchair Mobility    Modified Rankin (Stroke Patients Only)       Balance   Sitting-balance support: Feet supported;No upper extremity supported Sitting balance-Leahy Scale: Fair     Standing balance support: During functional activity;Reliant on assistive device for balance Standing balance-Leahy Scale: Poor                              Cognition Arousal/Alertness: Awake/alert Behavior During Therapy: WFL for tasks assessed/performed                           Following Commands: Follows one step commands consistently;Follows one step commands with increased time Safety/Judgement: Decreased awareness of safety;Decreased awareness of deficits  Exercises      General Comments        Pertinent Vitals/Pain Pain Assessment: Faces Faces Pain Scale: Hurts little more Pain Location: L knee Pain Descriptors / Indicators: Grimacing;Moaning;Tender;Sore Pain Intervention(s): Limited activity within patient's tolerance;Monitored during session;Repositioned;Ice applied    Home Living                          Prior Function            PT Goals (current goals can now be found in the care plan  section) Acute Rehab PT Goals Patient Stated Goal: regain independence PT Goal Formulation: With patient Time For Goal Achievement: 02/08/21 Potential to Achieve Goals: Fair Progress towards PT goals: Progressing toward goals    Frequency    7X/week      PT Plan Current plan remains appropriate    Co-evaluation              AM-PAC PT "6 Clicks" Mobility   Outcome Measure  Help needed turning from your back to your side while in a flat bed without using bedrails?: A Little Help needed moving from lying on your back to sitting on the side of a flat bed without using bedrails?: Total Help needed moving to and from a bed to a chair (including a wheelchair)?: A Lot Help needed standing up from a chair using your arms (e.g., wheelchair or bedside chair)?: A Little Help needed to walk in hospital room?: A Little Help needed climbing 3-5 steps with a railing? : A Lot 6 Click Score: 14    End of Session Equipment Utilized During Treatment: Gait belt Activity Tolerance: Patient tolerated treatment well;Patient limited by fatigue Patient left: in chair;with call bell/phone within reach;with chair alarm set;with family/visitor present   PT Visit Diagnosis: Pain;Other abnormalities of gait and mobility (R26.89);Difficulty in walking, not elsewhere classified (R26.2) Pain - Right/Left: Left Pain - part of body: Knee     Time: 0277-4128 PT Time Calculation (min) (ACUTE ONLY): 21 min  Charges:  $Gait Training: 8-22 mins                     Baxter Flattery, PT  Acute Rehab Dept ([place]) [phone] Pager [phone]  01/31/2021    [facility] 01/31/2021, 11:51 AM

## 2021-01-31 NOTE — Plan of Care (Signed)

## 2021-01-31 NOTE — Progress Notes (Signed)
01/31/21 1400  PT Visit Information  Last PT Received On 01/31/21  Pt progressing with PT this pm. Incr amb distance and tolerated exercises after amb, improved effort with bed mobility. Will continue to follow in acute setting. Discussed progress with pt dtr.   Assistance Needed +2  History of Present Illness Pt is 87 yo female s/p L TKA 01/25/21. Hx of R TKA 2021, SAH, multiple falls, DVT.  Subjective Data  Patient Stated Goal regain independence  Precautions  Precautions Fall;Knee  Restrictions  LLE Weight Bearing WBAT  Pain Assessment  Pain Assessment Faces  Faces Pain Scale 4  Pain Location L knee  Pain Descriptors / Indicators Grimacing;Moaning;Tender;Sore  Pain Intervention(s) Limited activity within patient's tolerance;Monitored during session;Premedicated before session;Repositioned;Ice applied  Cognition  Arousal/Alertness Awake/alert  Behavior During Therapy WFL for tasks assessed/performed  Overall Cognitive Status Within Functional Limits for tasks assessed  Following Commands Follows one step commands consistently  Bed Mobility  Overal bed mobility Needs Assistance  Bed Mobility Supine to Sit  Supine to sit Min assist;Min guard  Sit to supine Min assist  General bed mobility comments with incr time pt able to come to sit with min-guard assist and veral cues to complete task. min assist to lift LLE on to bed, cues tfor technique and to self assist with leg lifter. bed flat  Transfers  Overall transfer level Needs assistance  Equipment used Rolling walker (2 wheels)  Transfers Sit to/from Stand  Sit to Stand Min assist;+2 safety/equipment  General transfer comment multi-modal cues for hand placement and LE position  Ambulation/Gait  Ambulation/Gait assistance Min assist  Gait Distance (Feet) 40 Feet  Assistive device Rolling walker (2 wheels)  Gait Pattern/deviations Step-to pattern;Decreased stride length;Decreased weight shift to left;Narrow base of support   General Gait Details verbal cues for sequence, step length, incr BOS and for participation. min assist overall for balance and safety. gait stability improved from am session  Gait velocity decreased  Balance  Sitting-balance support Feet supported;No upper extremity supported  Sitting balance-Leahy Scale Fair  Standing balance support During functional activity;Reliant on assistive device for balance  Standing balance-Leahy Scale Poor  Total Joint Exercises  Ankle Circles/Pumps AROM;Both;10 reps;Supine  Straight Leg Raises AAROM;Left;10 reps  Heel Slides AAROM;Left;10 reps  PT - End of Session  Equipment Utilized During Treatment Gait belt  Activity Tolerance Patient tolerated treatment well;Patient limited by fatigue  Patient left in chair;with call bell/phone within reach;with chair alarm set;with family/visitor present   PT - Assessment/Plan  PT Plan Current plan remains appropriate  PT Visit Diagnosis Pain;Other abnormalities of gait and mobility (R26.89);Difficulty in walking, not elsewhere classified (R26.2)  Pain - Right/Left Left  Pain - part of body Knee  PT Frequency (ACUTE ONLY) 7X/week  Follow Up Recommendations Home health PT  Assistance recommended at discharge Frequent or constant Supervision/Assistance  PT equipment None recommended by PT  AM-PAC PT "6 Clicks" Mobility Outcome Measure (Version 2)  Help needed turning from your back to your side while in a flat bed without using bedrails? 3  Help needed moving from lying on your back to sitting on the side of a flat bed without using bedrails? 3  Help needed moving to and from a bed to a chair (including a wheelchair)? 3  Help needed standing up from a chair using your arms (e.g., wheelchair or bedside chair)? 3  Help needed to walk in hospital room? 3  Help needed climbing 3-5 steps with a railing?  2  6 Click Score 17  Consider Recommendation of Discharge To: Home with [facility]  PT Goal Progression  Progress towards PT  goals Progressing toward goals  Acute Rehab PT Goals  PT Goal Formulation With patient  Time For Goal Achievement 02/08/21  Potential to Achieve Goals Fair  PT Time Calculation  PT Start Time (ACUTE ONLY) 1330  PT Stop Time (ACUTE ONLY) 1401  PT Time Calculation (min) (ACUTE ONLY) 31 min  PT General Charges  $$ ACUTE PT VISIT 1 Visit  PT Treatments  $Gait Training 8-22 mins  $Therapeutic Activity 8-22 mins

## 2021-01-31 NOTE — Progress Notes (Signed)
Patient ID: Alexis Olson, female   DOB: March 13, 1934, 87 y.o.   MRN: [id number] Subjective: 6 Days Post-Op Procedure(s) (LRB): TOTAL KNEE ARTHROPLASTY (Left)    Patient reports pain as moderate. Successful BM yesterday with assistance from her daughter We had lengthy discussion today regarding how much this current situation is not normal life and activity.  Objective:   VITALS:   Vitals:   01/30/21 2047 01/31/21 0617  BP: 136/75 (!) 107/56  Pulse: 60 100  Resp: 16 16  Temp: 99.7 F (37.6 C) 98.7 F (37.1 C)  SpO2: 100% 98%    Neurovascular intact Incision: dressing C/D/I  LABS Recent Labs    01/29/21 0012  HGB 8.7*  HCT 26.7*    No results for input(s): NA, K, BUN, CREATININE, GLUCOSE in the last 72 hours.  No results for input(s): LABPT, INR in the last 72 hours.   Assessment/Plan: 6 Days Post-Op Procedure(s) (LRB): TOTAL KNEE ARTHROPLASTY (Left)   Up with therapy  SHE NEEDS to get up.  No matter what she says she needs to get out of bed and walk. She will not go to SNF Please work with her to get her home safely  I have spoken with her daughter who has the same feelings and has had the same discussion with her as I have this am which was to motivate her to get moving.  She somewhat manipulates staff by saying she is in pain and then all attempts at therapy and mobility stop.  We all agree that she needs to be pushed. She will not do well at SNF  Let's push for a home discharge Tuesday or Wednesday. I am aware of this whole ordeal but I do believe it is in her best interest to receive this initial care here

## 2021-02-01 LAB — CBC
HCT: 26.1 % — ABNORMAL LOW (ref 36.0–46.0)
Hemoglobin: 8.4 g/dL — ABNORMAL LOW (ref 12.0–15.0)
MCH: 29.9 pg (ref 26.0–34.0)
MCHC: 32.2 g/dL (ref 30.0–36.0)
MCV: 92.9 fL (ref 80.0–100.0)
Platelets: 265 10*3/uL (ref 150–400)
RBC: 2.81 MIL/uL — ABNORMAL LOW (ref 3.87–5.11)
RDW: 15.5 % (ref 11.5–15.5)
WBC: 8.6 10*3/uL (ref 4.0–10.5)
nRBC: 0 % (ref 0.0–0.2)

## 2021-02-01 NOTE — Progress Notes (Signed)
Physical Therapy Treatment Patient Details Name: Alexis Olson MRN: [id number] DOB: [birth year] Today's Date: 02/01/2021   History of Present Illness Pt is 87 yo female s/p L TKA 01/25/21. Hx of R TKA 2021, SAH, multiple falls, DVT.    PT Comments    Pt making gradual progress. She ambulated 40'x2 with seated rest break.  Requiring min A to stand with cues.  Pt reports pain in L knee as well as soreness "all over." Daughter present and encouraging pt to work with PT.    She does have steps to enter her home - Need to progress to stairs.     Recommendations for follow up therapy are one component of a multi-disciplinary discharge planning process, led by the attending physician.  Recommendations may be updated based on patient status, additional functional criteria and insurance authorization.  Follow Up Recommendations  Home health PT     Assistance Recommended at Discharge Frequent or constant Supervision/Assistance  Equipment Recommendations  None recommended by PT    Recommendations for Other Services       Precautions / Restrictions Precautions Precautions: Fall;Knee Restrictions LLE Weight Bearing: Weight bearing as tolerated     Mobility  Bed Mobility Overal bed mobility: Needs Assistance Bed Mobility: Supine to Sit;Sit to Supine     Supine to sit: Min assist Sit to supine: Mod assist   General bed mobility comments: Min A for R LE off bed; mod A for both back to bed; increased time and effort for transfers    Transfers Overall transfer level: Needs assistance Equipment used: Rolling walker (2 wheels) Transfers: Sit to/from Stand Sit to Stand: Min assist           General transfer comment: Performed from bed, bsc, and rollator (mod A from rollator).  All with cues for hand placement and getting feet under body, cues to rock back/forth to use momentum.  Pt used BSC and was able to complete ADLs with min A to steady    Ambulation/Gait Ambulation/Gait  assistance: Min guard Gait Distance (Feet): 40 Feet (40'x2) Assistive device: Rolling walker (2 wheels) Gait Pattern/deviations: Step-to pattern;Decreased weight shift to left;Narrow base of support;Decreased stride length;Trunk flexed Gait velocity: decreased     General Gait Details: Step to L gait with cues for posture and RW proximity; pt had c/o back pain - cued to stand straight; followed with rollator for rest break   Stairs             Wheelchair Mobility    Modified Rankin (Stroke Patients Only)       Balance Overall balance assessment: Needs assistance;History of Falls Sitting-balance support: Feet supported;No upper extremity supported Sitting balance-Leahy Scale: Good     Standing balance support: During functional activity;Reliant on assistive device for balance;Bilateral upper extremity supported Standing balance-Leahy Scale: Poor Standing balance comment: reliant on external support                            Cognition Arousal/Alertness: Awake/alert Behavior During Therapy: WFL for tasks assessed/performed Overall Cognitive Status: Within Functional Limits for tasks assessed                                          Exercises Total Joint Exercises Ankle Circles/Pumps: AROM;Both;10 reps;Supine Quad Sets: AROM;Both;10 reps;Supine Long Arc Quad: AROM;Left;10 reps;Seated (cues for thigh supported) Knee  Flexion: AAROM;Left;10 reps;Seated (min A for stretch at end range) Goniometric ROM: L knee ~5 to 70 degrees    General Comments General comments (skin integrity, edema, etc.): VSS.  Pt had c/o soreness in R thigh, back, arms, chest - "all over".  Discussed likely due to less activity than normal (particularly with back pain and being in hospital bed)and  use of more/different muscles than she is used to to accomodate for L leg.      Pertinent Vitals/Pain Pain Assessment: 0-10 Pain Score: 8  Pain Location: L knee; "all  over" Pain Descriptors / Indicators: Grimacing;Moaning;Sore Pain Intervention(s): Limited activity within patient's tolerance;Monitored during session;RN gave pain meds during session;Repositioned;Ice applied    Home Living                          Prior Function            PT Goals (current goals can now be found in the care plan section) Progress towards PT goals: Progressing toward goals    Frequency    7X/week      PT Plan Current plan remains appropriate    Co-evaluation              AM-PAC PT "6 Clicks" Mobility   Outcome Measure  Help needed turning from your back to your side while in a flat bed without using bedrails?: A Little Help needed moving from lying on your back to sitting on the side of a flat bed without using bedrails?: A Little Help needed moving to and from a bed to a chair (including a wheelchair)?: A Little Help needed standing up from a chair using your arms (e.g., wheelchair or bedside chair)?: A Lot Help needed to walk in hospital room?: A Little Help needed climbing 3-5 steps with a railing? : Total 6 Click Score: 15    End of Session Equipment Utilized During Treatment: Gait belt Activity Tolerance: Patient limited by pain Patient left: with call bell/phone within reach;with family/visitor present;in bed;with bed alarm set;with SCD's reapplied Nurse Communication: Mobility status PT Visit Diagnosis: Pain;Other abnormalities of gait and mobility (R26.89);Difficulty in walking, not elsewhere classified (R26.2) Pain - Right/Left: Left Pain - part of body: Knee     Time: 6295-2841 PT Time Calculation (min) (ACUTE ONLY): 42 min  Charges:  $Gait Training: 8-22 mins $Therapeutic Exercise: 8-22 mins $Therapeutic Activity: 8-22 mins                     Abran Richard, PT Acute Rehab Services Pager [phone] [facility] [place] 02/01/2021, 3:04 PM

## 2021-02-01 NOTE — Progress Notes (Signed)
Physical Therapy Treatment Patient Details Name: Alexis Olson MRN: [id number] DOB: Aug 30, 1933 Today's Date: 02/01/2021   History of Present Illness Pt is 87 yo female s/p L TKA 01/25/21. Hx of R TKA 2021, SAH, multiple falls, DVT.    PT Comments    Pt progressing with maximum encouragement. Amb 70' today. Will continue PT POC   Recommendations for follow up therapy are one component of a multi-disciplinary discharge planning process, led by the attending physician.  Recommendations may be updated based on patient status, additional functional criteria and insurance authorization.  Follow Up Recommendations  Home health PT     Assistance Recommended at Discharge Frequent or constant Supervision/Assistance  Equipment Recommendations  None recommended by PT    Recommendations for Other Services       Precautions / Restrictions Precautions Precautions: Fall;Knee Restrictions Weight Bearing Restrictions: No LLE Weight Bearing: Weight bearing as tolerated Other Position/Activity Restrictions: WBAT     Mobility  Bed Mobility               General bed mobility comments: in chair on arrival    Transfers Overall transfer level: Needs assistance Equipment used: Rolling walker (2 wheels) Transfers: Sit to/from Stand Sit to Stand: Min assist;+2 safety/equipment           General transfer comment: multi-modal cues for hand placement and bil LE position    Ambulation/Gait Ambulation/Gait assistance: Min assist Gait Distance (Feet): 50 Feet Assistive device: Rolling walker (2 wheels) Gait Pattern/deviations: Step-to pattern;Decreased weight shift to left;Narrow base of support;Decreased step length - right;Decreased step length - left Gait velocity: decreased     General Gait Details: verbal cues for sequence, step length, incr BOS and for participation. min assist overall for balance and safety. gait stability improved from am session   Stairs              Wheelchair Mobility    Modified Rankin (Stroke Patients Only)       Balance   Sitting-balance support: Feet supported;No upper extremity supported Sitting balance-Leahy Scale: Fair     Standing balance support: During functional activity;Reliant on assistive device for balance Standing balance-Leahy Scale: Poor                              Cognition Arousal/Alertness: Awake/alert Behavior During Therapy: WFL for tasks assessed/performed Overall Cognitive Status: Within Functional Limits for tasks assessed                         Following Commands: Follows one step commands consistently                Exercises Total Joint Exercises Ankle Circles/Pumps: AROM;Both;10 reps;Supine    General Comments        Pertinent Vitals/Pain Pain Assessment: 0-10 Pain Score: 5  Pain Location: L knee Pain Descriptors / Indicators: Grimacing;Moaning;Sore Pain Intervention(s): Limited activity within patient's tolerance;Monitored during session;Premedicated before session;Repositioned;Ice applied    Home Living                          Prior Function            PT Goals (current goals can now be found in the care plan section) Acute Rehab PT Goals Patient Stated Goal: regain independence PT Goal Formulation: With patient Time For Goal Achievement: 02/08/21 Potential to Achieve Goals: Fair Progress towards PT  goals: Progressing toward goals    Frequency    7X/week      PT Plan Current plan remains appropriate    Co-evaluation              AM-PAC PT "6 Clicks" Mobility   Outcome Measure  Help needed turning from your back to your side while in a flat bed without using bedrails?: A Little Help needed moving from lying on your back to sitting on the side of a flat bed without using bedrails?: A Little Help needed moving to and from a bed to a chair (including a wheelchair)?: A Little Help needed standing up from a  chair using your arms (e.g., wheelchair or bedside chair)?: A Little Help needed to walk in hospital room?: A Little Help needed climbing 3-5 steps with a railing? : A Lot 6 Click Score: 17    End of Session Equipment Utilized During Treatment: Gait belt Activity Tolerance: Patient tolerated treatment well;Patient limited by fatigue Patient left: in chair;with call bell/phone within reach;with chair alarm set;with family/visitor present   PT Visit Diagnosis: Pain;Other abnormalities of gait and mobility (R26.89);Difficulty in walking, not elsewhere classified (R26.2) Pain - Right/Left: Left Pain - part of body: Knee     Time: 2500-3704 PT Time Calculation (min) (ACUTE ONLY): 20 min  Charges:  $Gait Training: 8-22 mins                     Baxter Flattery, PT  Acute Rehab Dept ([place]) [phone] Pager [phone]  02/01/2021    [facility] 02/01/2021, 11:53 AM

## 2021-02-01 NOTE — Progress Notes (Signed)
Subjective: 7 Days Post-Op Procedure(s) (LRB): TOTAL KNEE ARTHROPLASTY (Left) Patient reports pain as mild.   Patient seen in rounds for Dr. Alvan Dame. Patient is well, and has had no acute complaints or problems. No acute events overnight. Voiding without difficulty. Patient ambulated 40 feet with PT. She denies CP, SHOB, N/V, abdominal pain.  We will continue therapy today.   Objective: Vital signs in last 24 hours: Temp:  [99 F (37.2 C)-99.5 F (37.5 C)] 99 F (37.2 C) (11/08 0552) Pulse Rate:  [52-84] 84 (11/08 0552) Resp:  [16-18] 16 (11/08 0552) BP: (102-122)/(51-63) 102/54 (11/08 0552) SpO2:  [93 %-97 %] 93 % (11/08 0552)  Intake/Output from previous day:  Intake/Output Summary (Last 24 hours) at 02/01/2021 0716 Last data filed at 02/01/2021 0300 Gross per 24 hour  Intake 480 ml  Output 1000 ml  Net -520 ml     Intake/Output this shift: No intake/output data recorded.  Labs: No results for input(s): HGB in the last 72 hours. No results for input(s): WBC, RBC, HCT, PLT in the last 72 hours. No results for input(s): NA, K, CL, CO2, BUN, CREATININE, GLUCOSE, CALCIUM in the last 72 hours. No results for input(s): LABPT, INR in the last 72 hours.  Exam: General - Patient is Alert and Oriented Extremity - Neurologically intact Sensation intact distally Intact pulses distally Dorsiflexion/Plantar flexion intact Dressing - dressing C/D/I Motor Function - intact, moving foot and toes well on exam.   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 difficile diarrhea    Colon perforation ([place]) 08/2014   following polypetomy during colo, surgical repair   COLONIC POLYPS, ADENOMATOUS, HX OF 2010   DIVERTICULOSIS, COLON    DVT (deep venous thrombosis) ([place]) 2018   DVT (deep  venous thrombosis) (HCC)    GERD (gastroesophageal reflux disease) 08/23/2015   Graded compression stocking in place    History of blood transfusion    History of migraine    History of rheumatic fever    HSV (herpes simplex virus) infection 08/16/2015   Hyperlipidemia    Hypertension    Denies   Hyponatremia 05/02/2016   Incomplete left bundle branch block (LBBB) 2016   Noted on EKG   INSOMNIA    Lumbosacral disc disease    Chronic pain; History of osteomyelitis 2010 following ESI complication   Obesity    Osteomyelitis (HCC)    Pelvic fracture (HCC)    Pneumonia    Recurrent UTI 08/23/2015   URINARY URGENCY    Ventral hernia without obstruction or gangrene 05/22/2018   VITAMIN B12 DEFICIENCY     Assessment/Plan: 7 Days Post-Op Procedure(s) (LRB): TOTAL KNEE ARTHROPLASTY (Left) Active Problems:   S/P total knee arthroplasty, left  Estimated body mass index is 29.56 kg/m as calculated from the following:   Height as of this encounter: 5\' 4"  (1.626 m).   Weight as of this encounter: 78.1 kg. Advance diet Up with therapy  Anticipated LOS equal to or greater than 2 midnights due to - Age 80 and older with one or more of the following:  - Obesity  - Expected need for hospital services (PT, OT, Nursing) required for safe  discharge  - Anticipated need for postoperative skilled nursing  care or inpatient rehab  - Active co-morbidities: Cardiac Arrhythmia OR   - Unanticipated findings during/Post Surgery: Slow post-op progression: GI, pain control, mobility  - Patient is a high risk of re-admission due to: None   DVT Prophylaxis -  Eliquis Weight bearing as tolerated.  BP has been soft, but unfortunately she takes metoprolol for a fib.   Will recheck CBC today.  I commended her for making good progress with therapy yesterday, and encouraged her to work hard today to continue progressing. She wishes to go home today if she is meeting her goals, but I am not sure she will be  ready today. Otherwise, plan remains to discharge home tomorrow.  PT recommends HHPT which is fine for 2 weeks if family wishes to do so.     Griffith Citron, PA-C Orthopedic Surgery [phone] 02/01/2021, 7:16 AM

## 2021-02-01 NOTE — Plan of Care (Signed)
  Problem: Education: Goal: Knowledge of the prescribed therapeutic regimen will improve Outcome: Progressing   Problem: Activity: Goal: Ability to avoid complications of mobility impairment will improve Outcome: Progressing Goal: Range of joint motion will improve Outcome: Progressing   Problem: Clinical Measurements: Goal: Postoperative complications will be avoided or minimized Outcome: Progressing   Problem: Pain Management: Goal: Pain level will decrease with appropriate interventions Outcome: Progressing   Problem: Skin Integrity: Goal: Will show signs of wound healing Outcome: Progressing   Problem: Education: Goal: Knowledge of General Education information will improve Description: Including pain rating scale, medication(s)/side effects and non-pharmacologic comfort measures Outcome: Progressing   Problem: Health Behavior/Discharge Planning: Goal: Ability to manage health-related needs will improve Outcome: Progressing   Problem: Clinical Measurements: Goal: Ability to maintain clinical measurements within normal limits will improve Outcome: Progressing Goal: Will remain free from infection Outcome: Progressing Goal: Diagnostic test results will improve Outcome: Progressing Goal: Respiratory complications will improve Outcome: Progressing Goal: Cardiovascular complication will be avoided Outcome: Progressing   Problem: Activity: Goal: Risk for activity intolerance will decrease Outcome: Progressing   Problem: Nutrition: Goal: Adequate nutrition will be maintained Outcome: Progressing   Problem: Coping: Goal: Level of anxiety will decrease Outcome: Progressing   Problem: Elimination: Goal: Will not experience complications related to bowel motility Outcome: Progressing   Problem: Pain Managment: Goal: General experience of comfort will improve Outcome: Progressing   Problem: Safety: Goal: Ability to remain free from injury will improve Outcome:  Progressing   Problem: Skin Integrity: Goal: Risk for impaired skin integrity will decrease Outcome: Progressing

## 2021-02-01 NOTE — Care Management Important Message (Signed)
Important Message  Patient Details IM Letter given to the Patient. Name: Alexis Olson MRN: [id number] Date of Birth: [birth year]   Medicare Important Message Given:  Yes     Kerin Salen 02/01/2021, 11:26 AM

## 2021-02-01 NOTE — TOC Progression Note (Signed)
Transition of Care [facility]) - Progression Note   Patient Details  Name: Alexis Olson MRN: [id number] Date of Birth: 1934-02-28  Transition of Care [facility]) CM/SW [place], LCSW Phone Number: 02/01/2021, 3:06 PM  Clinical Narrative: Due to limited progress with PT, ortho PA indicated patient can discharge home with HHPT for 2 weeks prior to starting OPPT. CSW met with patient and her husband to discuss recommendations. Patient requested [facility] as she has used the agency before.  CSW made HHPT referral to Cindie with [facility], which was accepted. TOC to follow.  Expected Discharge Plan: [place] Barriers to Discharge: No Barriers Identified  Expected Discharge Plan and Services Expected Discharge Plan: [place] Choice: Home Health              DME Arranged: CPM DME Agency: [employer] Representative spoke with at DME Agency: Prearranged in orthopedist's office HH Arranged: PT [place]: [place] Date [place]: 02/01/21 Time [place]: 1252 Representative spoke with at [place]: [place]  Readmission Risk Interventions Readmission Risk Prevention Plan 11/16/2019  Transportation Screening Complete  PCP or Specialist Appt within 5-7 Days Complete  Home Care Screening Complete  Medication Review (RN CM) Complete  Some recent data might be hidden

## 2021-02-02 ENCOUNTER — Other Ambulatory Visit (HOSPITAL_COMMUNITY): Payer: Self-pay

## 2021-02-02 MED ORDER — DOCUSATE SODIUM 100 MG PO CAPS
100.0000 mg | ORAL_CAPSULE | Freq: Two times a day (BID) | ORAL | 0 refills | Status: AC
  Filled 2021-02-02: qty 10, 5d supply, fill #0

## 2021-02-02 MED ORDER — OXYCODONE HCL 5 MG PO TABS
5.0000 mg | ORAL_TABLET | Freq: Four times a day (QID) | ORAL | 0 refills | Status: DC | PRN
  Filled 2021-02-02: qty 42, 7d supply, fill #0

## 2021-02-02 MED ORDER — METHOCARBAMOL 500 MG PO TABS
500.0000 mg | ORAL_TABLET | Freq: Four times a day (QID) | ORAL | 0 refills | Status: DC | PRN
Start: 2021-02-02 — End: 2021-03-19
  Filled 2021-02-02: qty 40, 10d supply, fill #0

## 2021-02-02 MED ORDER — APIXABAN 2.5 MG PO TABS
2.5000 mg | ORAL_TABLET | Freq: Two times a day (BID) | ORAL | 0 refills | Status: DC
Start: 2021-02-02 — End: 2021-03-02
  Filled 2021-02-02: qty 56, 28d supply, fill #0

## 2021-02-02 MED ORDER — POLYETHYLENE GLYCOL 3350 17 GM/SCOOP PO POWD
17.0000 g | Freq: Every day | ORAL | 0 refills | Status: DC | PRN
  Filled 2021-02-02: qty 238, 14d supply, fill #0

## 2021-02-02 NOTE — Progress Notes (Signed)
Physical Therapy Treatment Patient Details Name: Alexis Olson MRN: [id number] DOB: [birth year] Today's Date: 02/02/2021   History of Present Illness Pt is 87 yo female s/p L TKA 01/25/21. Hx of R TKA 2021, SAH, multiple falls, DVT.    PT Comments    POD # 8 Pt is NOT progressing as well as she should be.  General Comments: AxO x 2.5 slightly "foggy" and unrealistic about what she thinks she can do vs what she is able.  MAX c/o feeling "tired" and "weak".  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  Safety cognition is poor and expectations is unrealistic.  Pt is POD # 8 and still requires "heavy" physical assist for mobility(esp transfers).  Consulted LPT that amount of pain meds may be effecting her performance.   Pt will need 24/7 assist at home and Son states pt's spouse is physically unable.   Pt will need ambulance transport to home       Recommendations for follow up therapy are one component of a multi-disciplinary discharge planning process, led by the attending physician.  Recommendations may be updated based on patient status, additional functional criteria and insurance authorization.  Follow Up Recommendations  Home health PT     Assistance Recommended at Discharge Frequent or constant Supervision/Assistance  Equipment Recommendations       Recommendations for Other Services       Precautions / Restrictions Precautions Precautions: Fall;Knee Precaution Comments: instructed no pillow under knee Restrictions Weight Bearing Restrictions: No LLE Weight Bearing: Weight bearing as tolerated     Mobility  Bed Mobility Overal bed mobility: Needs Assistance Bed Mobility: Supine to Sit     Supine to sit: Min assist     General bed mobility comments: pt required increased time x 3 with greatest difficulty scooting hips to EOB as well as needed support to guide L LE off bed.  Pt with multiple c/o's as to justify her struggle.  The bed, the blamkets,  etc.  Attempted to demonstrate how to use belt to self assist L LE off bed pt was still unable to complete on her own.  Requires increased physical Assist back to bed.    Transfers Overall transfer level: Needs assistance Equipment used: Rolling walker (2 wheels) Transfers: Sit to/from Stand Sit to Stand: Mod assist;Max assist           General transfer comment: pt was UNABLE to self rise from even an elevated bed.  Had x 3 posterior LOB (sit back on bed). when she transfered her hands off bed to reach fro walker.  poor balance with poor ability to self correct.  Therapist had to provide MOD ASSIST with gait belt for pt to rise and shift weight fowrard.  Also very unsteady pivoting to [facility] present with posterior LOB again and reaching for BSC arm rests prior to complete turn.  Pt did have an uncontrolled desend "plop" due to fatigue.    Ambulation/Gait Ambulation/Gait assistance: Min guard;Min assist Gait Distance (Feet): 26 Feet Assistive device: Rolling walker (2 wheels) Gait Pattern/deviations: Step-to pattern;Decreased weight shift to left;Narrow base of support;Decreased stride length;Trunk flexed Gait velocity: decreased     General Gait Details: very limited amb distance due to max c/o fatigue.  Poor forward flex posture.  Recliner following for safety.  Son present and observed.   Stairs Stairs: Yes Stairs assistance: Max assist;Total assist Stair Management: Forwards;With walker;Step to pattern Number of Stairs: 2 General stair comments:  Pt with near fall on second step up as she presents with weak B UE's and was unable to pull her weight up.  Therapist had to offer posterior push for pt to complete.  Too unsafe for family to attempt.  Rec ambulance transfer if D/C to home.   Wheelchair Mobility    Modified Rankin (Stroke Patients Only)       Balance                                            Cognition Arousal/Alertness: Awake/alert Behavior  During Therapy: WFL for tasks assessed/performed Overall Cognitive Status: Within Functional Limits for tasks assessed                                 General Comments: AxO x 2.5 slightly "foggy" and unrealistic about what she thinks she can do vs what she is able.  MAX c/o feeling "tired" and "weak".  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  Safety cognition is poor and expectations is unrealistic.  Pt is POD # 8 and still requires "heavy" physical assist for mobility(esp transfers).        Exercises      General Comments        Pertinent Vitals/Pain Pain Assessment: Faces Faces Pain Scale: Hurts little more Pain Location: L knee, back Pain Descriptors / Indicators: Grimacing;Moaning;Sore Pain Intervention(s): Monitored during session;Premedicated before session;Repositioned;Ice applied    Home Living                          Prior Function            PT Goals (current goals can now be found in the care plan section) Progress towards PT goals: Progressing toward goals    Frequency    7X/week      PT Plan Current plan remains appropriate    Co-evaluation              AM-PAC PT "6 Clicks" Mobility   Outcome Measure  Help needed turning from your back to your side while in a flat bed without using bedrails?: A Lot Help needed moving from lying on your back to sitting on the side of a flat bed without using bedrails?: A Lot Help needed moving to and from a bed to a chair (including a wheelchair)?: A Lot Help needed standing up from a chair using your arms (e.g., wheelchair or bedside chair)?: A Lot Help needed to walk in hospital room?: A Lot Help needed climbing 3-5 steps with a railing? : Total 6 Click Score: 11    End of Session Equipment Utilized During Treatment: Gait belt Activity Tolerance: Patient limited by fatigue Patient left: with call bell/phone within reach;with family/visitor present;in chair Nurse  Communication: Mobility status PT Visit Diagnosis: Pain;Other abnormalities of gait and mobility (R26.89);Difficulty in walking, not elsewhere classified (R26.2) Pain - Right/Left: Left Pain - part of body: Knee     Time: 5465-0354 PT Time Calculation (min) (ACUTE ONLY): 28 min  Charges:  $Gait Training: 8-22 mins $Therapeutic Activity: 8-22 mins                     {Cordarrel Stiefel  PTA Acute  Environmental education officer  [phone] Office      [phone]

## 2021-02-02 NOTE — Progress Notes (Signed)
Subjective: 8 Days Post-Op Procedure(s) (LRB): TOTAL KNEE ARTHROPLASTY (Left) Patient reports pain as mild.   Patient seen in rounds with Dr. Alvan Dame. Patient is well, and has had no acute complaints or problems. No acute events overnight. Patient ambulated 40 feet x 2 with PT  yesterday. HHPT arranged. We will continue therapy today.   Objective: Vital signs in last 24 hours: Temp:  [98.6 F (37 C)-100.3 F (37.9 C)] 98.6 F (37 C) (11/09 0443) Pulse Rate:  [53-109] 98 (11/09 0443) Resp:  [16-20] 20 (11/09 0443) BP: (107-126)/(53-56) 107/54 (11/09 0443) SpO2:  [91 %-98 %] 91 % (11/09 0443)  Intake/Output from previous day:  Intake/Output Summary (Last 24 hours) at 02/02/2021 0753 Last data filed at 02/02/2021 0600 Gross per 24 hour  Intake 1350 ml  Output --  Net 1350 ml     Intake/Output this shift: No intake/output data recorded.  Labs: Recent Labs    02/01/21 0707  HGB 8.4*   Recent Labs    02/01/21 0707  WBC 8.6  RBC 2.81*  HCT 26.1*  PLT 265   No results for input(s): NA, K, CL, CO2, BUN, CREATININE, GLUCOSE, CALCIUM in the last 72 hours. No results for input(s): LABPT, INR in the last 72 hours.  Exam: General - Patient is Alert and Oriented Extremity - Neurologically intact Sensation intact distally Intact pulses distally Dorsiflexion/Plantar flexion intact Dressing - dressing C/D/I Motor Function - intact, moving foot and toes well on exam.   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 difficile diarrhea    Colon perforation ([place]) 08/2014   following polypetomy during colo, surgical repair   COLONIC POLYPS, ADENOMATOUS, HX OF 2010   DIVERTICULOSIS, COLON    DVT (deep venous thrombosis) ([place]) 2018   DVT (deep venous thrombosis) (HCC)    GERD  (gastroesophageal reflux disease) 08/23/2015   Graded compression stocking in place    History of blood transfusion    History of migraine    History of rheumatic fever    HSV (herpes simplex virus) infection 08/16/2015   Hyperlipidemia    Hypertension    Denies   Hyponatremia 05/02/2016   Incomplete left bundle branch block (LBBB) 2016   Noted on EKG   INSOMNIA    Lumbosacral disc disease    Chronic pain; History of osteomyelitis 2010 following ESI complication   Obesity    Osteomyelitis (HCC)    Pelvic fracture (HCC)    Pneumonia    Recurrent UTI 08/23/2015   URINARY URGENCY    Ventral hernia without obstruction or gangrene 05/22/2018   VITAMIN B12 DEFICIENCY     Assessment/Plan: 8 Days Post-Op Procedure(s) (LRB): TOTAL KNEE ARTHROPLASTY (Left) Active Problems:   S/P total knee arthroplasty, left  Estimated body mass index is 29.56 kg/m as calculated from the following:   Height as of this encounter: 5\' 4"  (1.626 m).   Weight as of this encounter: 78.1 kg. Advance diet Up with therapy D/C IV fluids  Anticipated LOS equal to or greater than 2 midnights due to - Age 42 and older with one or more of the following:  - Obesity  - Expected need for hospital services (PT, OT, Nursing) required for safe  discharge  -  Anticipated need for postoperative skilled nursing care or inpatient rehab  - Active co-morbidities: Cardiac Arrhythmia OR   - Unanticipated findings during/Post Surgery: Slow post-op progression: GI, pain control, mobility  - Patient is a high risk of re-admission due to: None   DVT Prophylaxis -  Eliquis 2.5 BID Weight bearing as tolerated.  Hgb stable at 8.4 yesterday.  Plan is to go Home after hospital stay.   She has made progress over the past two days, but Dr. Alvan Dame again discussed with her the importance of getting up regularly to move and walk at home.   Plan for discharge today following 1-2 sessions of PT as long as they are meeting their goals.  Patient is scheduled for HHPT with OPPT To follow. Follow up in the office in 2 weeks.   Griffith Citron, PA-C Orthopedic Surgery [phone] 02/02/2021, 7:53 AM

## 2021-02-02 NOTE — Plan of Care (Signed)

## 2021-02-02 NOTE — Plan of Care (Signed)
  Problem: Education: Goal: Knowledge of the prescribed therapeutic regimen will improve Outcome: Progressing   Problem: Pain Management: Goal: Pain level will decrease with appropriate interventions Outcome: Progressing   

## 2021-02-02 NOTE — Progress Notes (Addendum)
Physical Therapy Treatment Patient Details Name: Alexis Olson MRN: [id number] DOB: Apr 12, 1933 Today's Date: 02/02/2021   History of Present Illness Pt is 87 yo female s/p L TKA 01/25/21. Hx of R TKA 2021, SAH, multiple falls, DVT.    PT Comments    POD # 8 pm session Spouse present during session so had him Physical Assist pt with tranfers and amb. General transfer comment: had Spouse "hands on" assist pt to stand from recliner and transfer to [facility].  Pt had great difficulty shifting weight forward and B feet were placed too far front.  Had pt wear a gait belt and instructed spouse to stand infront and pull her upward and forward by pulling on gait belt which pt wore around her waist.  Spouse was unable the first 2 attempts.  Then pt admitted she uses a LIFT CHAIR at home to get up.  Third attempt was successful but unsteady.  Therapist had to assist to prevent LOB.  Spouse assisted while pt pivoted to [facility] but then present with uncontrolled desend.  Same difficulty rising from [facility]. General Gait Details: decreased amb distance due to increased c/o fatigue after using BSC.  Had Spouse "hands on" assist pt with amb a total of 14 feet. Pt has NOT met Mobility Goals to safely D/C to home.  Pt declining SNF.  Will need 24/7 care at home and ambulance transport.    Recommendations for follow up therapy are one component of a multi-disciplinary discharge planning process, led by the attending physician.  Recommendations may be updated based on patient status, additional functional criteria and insurance authorization.  Follow Up Recommendations  Home health PT     Assistance Recommended at Discharge Frequent or constant Supervision/Assistance  Equipment Recommendations       Recommendations for Other Services       Precautions / Restrictions Precautions Precautions: Fall;Knee Precaution Comments: instructed no pillow under knee Restrictions Weight Bearing Restrictions: No LLE Weight Bearing:  Weight bearing as tolerated     Mobility  Bed Mobility   General bed mobility comments: OOB in recliner    Transfers Overall transfer level: Needs assistance Equipment used: Rolling walker (2 wheels) Transfers: Sit to/from Stand Sit to Stand: Max assist           General transfer comment: had Spouse "hands on" assist pt to stand from recliner and transfer to [facility].  Pt had great difficulty shifting weight forward and B feet were placed too far front.  Had pt wear a gait belt and instructed spouse to stand infront and pull her upward and forward bu pulling on gait belt which pt wore around her waist.  Spouse was unable the first 2 attempts.  Then pt admitted she uses a LIFT CHAIR at home to get up.  Third attempt was successful but unsteady.  Therapist had to assist to prevent LOB.  Spouse assisted while pt pivoted to [facility] but then present with uncontrolled desend.  Same difficulty rising from [facility].    Ambulation/Gait Ambulation/Gait assistance: Min guard Gait Distance (Feet): 14 Feet Assistive device: Rolling walker (2 wheels) Gait Pattern/deviations: Step-to pattern;Decreased weight shift to left;Narrow base of support;Decreased stride length;Trunk flexed Gait velocity: decreased     General Gait Details: decreased amb distance due to increased c/o fatigue after using BSC.  Had Spouse "hands on" assist pt with amb a total of 14 feet.   Stairs  Wheelchair Mobility    Modified Rankin (Stroke Patients Only)  Balance                                            Cognition Arousal/Alertness: Awake/alert Behavior During Therapy: WFL for tasks assessed/performed Overall Cognitive Status: Within Functional Limits for tasks assessed                                 General Comments: AxO x 2.5 slightly "foggy" and unrealistic about what she thinks she can do vs what she is able.  MAX c/o feeling "tired" and "weak".  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  Safety cognition is poor and expectations is unrealistic.  Pt is POD # 8 and still requires "heavy" physical assist for mobility(esp transfers).        Exercises      General Comments        Pertinent Vitals/Pain Pain Assessment: Faces Faces Pain Scale: Hurts little more Pain Location: L knee, back Pain Descriptors / Indicators: Grimacing;Moaning;Sore Pain Intervention(s): Monitored during session;Premedicated before session;Repositioned;Ice applied    Home Living                          Prior Function            PT Goals (current goals can now be found in the care plan section) Progress towards PT goals: Progressing toward goals    Frequency    7X/week      PT Plan Current plan remains appropriate    Co-evaluation              AM-PAC PT "6 Clicks" Mobility   Outcome Measure  Help needed turning from your back to your side while in a flat bed without using bedrails?: A Lot Help needed moving from lying on your back to sitting on the side of a flat bed without using bedrails?: A Lot Help needed moving to and from a bed to a chair (including a wheelchair)?: A Lot Help needed standing up from a chair using your arms (e.g., wheelchair or bedside chair)?: A Lot Help needed to walk in hospital room?: A Lot Help needed climbing 3-5 steps with a railing? : Total 6 Click Score: 11    End of Session Equipment Utilized During Treatment: Gait belt Activity Tolerance: Patient limited by fatigue Patient left: with call bell/phone within reach;with family/visitor present;in chair Nurse Communication: Mobility status PT Visit Diagnosis: Pain;Other abnormalities of gait and mobility (R26.89);Difficulty in walking, not elsewhere classified (R26.2) Pain - Right/Left: Left Pain - part of body: Knee     Time: 5038-8828 PT Time Calculation (min) (ACUTE ONLY): 23 min  Charges:  $Gait Training: 8-22 mins $Therapeutic  Activity: 8-22 mins                     Rica Koyanagi  PTA Acute  Rehabilitation Services Pager      [phone] Office      [phone]

## 2021-02-03 ENCOUNTER — Other Ambulatory Visit (HOSPITAL_COMMUNITY): Payer: Self-pay

## 2021-02-03 NOTE — Plan of Care (Signed)
  Problem: Education: Goal: Knowledge of the prescribed therapeutic regimen will improve Outcome: Progressing   Problem: Pain Management: Goal: Pain level will decrease with appropriate interventions Outcome: Progressing   Problem: Activity: Goal: Risk for activity intolerance will decrease Outcome: Progressing   

## 2021-02-03 NOTE — Progress Notes (Signed)
Physical Therapy Treatment Patient Details Name: Alexis Olson MRN: [id number] DOB: 1933-09-13 Today's Date: 02/03/2021   History of Present Illness Pt is 87 yo female s/p L TKA 01/25/21. Hx of R TKA 2021, SAH, multiple falls, DVT.    PT Comments    POD # 9 pm session General transfer comment: with Daughter RN assisted "hands on" with transfer out of recliner as well as on/off elevated toilet.  Instructed pt and daughter to "just keep gait belt on all day" for safety if pt needs assist/posterior LOB correction.  Instructed on proper safe handling esp with turning and backward gait as pt tends to stagger backward.General Gait Details: with Daughter RN "hands on" using gait belt assist with amb pt a functional distance of 32 feet.  Instructed daughter on safe handling esp with backward steps/side steps.  Pt is still a FALL RISK.  Advised daughter to NOT let pt amb on her own unassisted.  Improving but Pt is still a FALL RISK. General stair comments: With Daughter RN "hands on" using gait belt assisted with navigating 3 stair steps using walker B Hands while Daughter securing from behind.  Pt was able to perform 3 steps with 25% VC's on proper tech and sequencing.  Pt has 4 or 5 steps to enter her home.  Daughter assisted securing walker and pt from front on way down 3 steps with 25% VC's on proper walker placement and sequencing.  Advised to have an addittional support and "wear gait belt". Pt has met minimal goals to D/C to home with 24/7 family support.  Advised pt to increase her activity level every day.  Addressed all mobility questions, discussed appropriate activity, educated on use of ICE.  Pt ready for D/C to home.   Recommendations for follow up therapy are one component of a multi-disciplinary discharge planning process, led by the attending physician.  Recommendations may be updated based on patient status, additional functional criteria and insurance authorization.  Follow Up  Recommendations  Home health PT     Assistance Recommended at Discharge Frequent or constant Supervision/Assistance  Equipment Recommendations  None recommended by PT    Recommendations for Other Services       Precautions / Restrictions Precautions Precautions: Fall;Knee Precaution Comments: instructed no pillow under knee Restrictions Weight Bearing Restrictions: No LLE Weight Bearing: Weight bearing as tolerated     Mobility  Bed Mobility               General bed mobility comments: OOB in recliner    Transfers Overall transfer level: Needs assistance Equipment used: Rolling walker (2 wheels) Transfers: Sit to/from Stand Sit to Stand: Min guard           General transfer comment: with Daughter RN assisted "hands on" with transfer out of recliner as well as on/off elevated toilet.  Instructed pt and daughter to "just keep gait belt on all day" for safety if pt needs assist/posterior LOB correction.  Instructed on proper safe handling esp with turning and backward gait as pt tends to stagger backward.    Ambulation/Gait Ambulation/Gait assistance: Supervision;Min guard Gait Distance (Feet): 32 Feet Assistive device: Rolling walker (2 wheels) Gait Pattern/deviations: Step-to pattern;Decreased weight shift to left;Narrow base of support;Decreased stride length;Trunk flexed Gait velocity: decreased     General Gait Details: with Daughter RN "hands on" using gait belt assist with amb pt a functional distance of 32 feet.  Instructed daughter on safe handling esp with backward steps/side steps.  Pt  is still a FALL RISK.  Advised daughter to NOT let pt amb on her own unassisted.  Improving but Pt is still a FALL RISK.   Stairs Stairs: Yes Stairs assistance: Mod assist;Max assist Stair Management: Forwards;With walker;Step to pattern Number of Stairs: 3 General stair comments: With Daughter RN "hands on" using gait belt assisted with navigating 3 stair steps  using walker B Hands while Daughter securing from behind.  Pt was able to perform 3 steps with 25% VC's on proper tech and sequencing.  Pt has 4 or 5 steps to enter her home.  Daughter assisted securing walker and pt from front on way down 3 steps with 25% VC's on proper walker placement and sequencing.  Advised to have an addittional support and "wear gait belt".   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  Appears more Sprite and decreased c/o feeling "tired".        Exercises      General Comments        Pertinent Vitals/Pain Pain Assessment: 0-10 Pain Score: 5  Pain Location: L knee, Pain Descriptors / Indicators: Grimacing;Moaning;Sore Pain Intervention(s): Monitored during session;Repositioned    Home Living                          Prior Function            PT Goals (current goals can now be found in the care plan section) Progress towards PT goals: Progressing toward goals    Frequency    7X/week      PT Plan Current plan remains appropriate    Co-evaluation              AM-PAC PT "6 Clicks" Mobility   Outcome Measure  Help needed turning from your back to your side while in a flat bed without using bedrails?: A Lot Help needed moving from lying on your back to sitting on the side of a flat bed without using bedrails?: A Lot Help needed moving to and from a bed to a chair (including a wheelchair)?: A Lot Help needed standing up from a chair using your arms (e.g., wheelchair or bedside chair)?: A Lot Help needed to walk in hospital room?: A Lot Help needed  climbing 3-5 steps with a railing? : A Lot 6 Click Score: 12    End of Session Equipment Utilized During Treatment: Gait belt Activity Tolerance: Patient tolerated treatment well Patient left: with call bell/phone within reach;with family/visitor present;in chair Nurse Communication: Mobility status (Family education complete.  Pt has met goals to D/C to home with 24/7 family care) PT Visit Diagnosis: Pain;Other abnormalities of gait and mobility (R26.89);Difficulty in walking, not elsewhere classified (R26.2) Pain - Right/Left: Left Pain - part of body: Knee     Time: 9826-4158 PT  Time Calculation (min) (ACUTE ONLY): 25 min  Charges:  $Gait Training: 8-22 mins $Therapeutic Activity: 8-22 mins                     {Pansey Pinheiro  PTA Acute  Rehabilitation Services Pager      [phone] Office      [phone]

## 2021-02-03 NOTE — Progress Notes (Signed)
Physical Therapy Treatment Patient Details Name: Alexis Olson MRN: [id number] DOB: 05-Nov-1933 Today's Date: 02/03/2021   History of Present Illness Pt is 87 yo female s/p L TKA 01/25/21. Hx of R TKA 2021, SAH, multiple falls, DVT.    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  Appears more Sprite and decreased c/o feeling "tired".  Assisted out of recliner.  General transfer comment: pt did better getting self out of recliner.  required X 2 attempts but able with decreased posterior lean.  Still would not advise she perform on her own.  HIGH FALL RISK.  Also assisted to bathroom where pt was self able to rise from elevated commode and perform self peri care.  Required Close Guard for balance. General Gait Details: pt tolerated amb in hallway and to and from bathroom a total of 26 feet.  Slow but steady.General stair comments: assisted with navigating 2 stair steps using walker B Hands while Therapist secured from behind.  Pt was able to perform 2 steps with 25% VC's on proper tech and sequencing.  Pt has 4 or 5 steps to enter her home.  Will repeat stairs again this afternoon when family is present. Pt declines SNF and plans to return home with spouse.    Recommendations for follow up therapy are one component of a multi-disciplinary discharge planning process, led by the attending physician.  Recommendations may be updated based on patient status, additional functional criteria and insurance authorization.  Follow Up Recommendations  Home health PT     Assistance Recommended at Discharge Frequent or constant Supervision/Assistance  Equipment Recommendations  None recommended by PT    Recommendations for Other Services       Precautions / Restrictions Precautions Precautions: Fall;Knee Precaution Comments: instructed no pillow under knee Restrictions Weight Bearing Restrictions: No LLE Weight Bearing: Weight bearing as tolerated      Mobility  Bed Mobility               General bed mobility comments: OOB in recliner    Transfers Overall transfer level: Needs assistance Equipment used: Rolling walker (2 wheels) Transfers: Sit to/from Stand Sit to Stand: Min guard           General transfer comment: pt did better getting self out of recliner.  required X 2 attempts but able with decreased posterior lean.  Still would not advise she perform on her own.  HIGH FALL RISK.  Also assisted to bathroom where pt was self able to rise from elevated commode and perform self peri care.  Required Close Guard for balance.    Ambulation/Gait Ambulation/Gait assistance: Supervision;Min guard Gait Distance (Feet): 26 Feet Assistive device: Rolling walker (2 wheels) Gait Pattern/deviations: Step-to pattern;Decreased weight shift to left;Narrow base of support;Decreased stride length;Trunk flexed Gait velocity: decreased     General Gait Details: pt tolerated amb in hallway and to and from bathroom a total of 26 feet.  Slow but steady.   Stairs Stairs: Yes Stairs assistance: Max assist Stair Management: Forwards;With walker;Step to pattern   General stair comments: assisted with navigating 2 stair steps using walker B Hands while Therapist secured from behind.  Pt was able to perform 2 steps with 25% VC's on proper tech and sequencing.  Pt has 4 or 5 steps to enter her home.  Will repeat stairs again this afternoon when family is present.   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  Appears more Sprite and decreased c/o feeling "tired".         Exercises      General Comments        Pertinent Vitals/Pain Pain Assessment: 0-10 Pain Score: 5  Pain Location: L knee, Pain Descriptors / Indicators: Grimacing;Moaning;Sore Pain Intervention(s): Monitored during session;Repositioned    Home Living                          Prior Function            PT Goals (current goals can now be found in the care plan section) Progress towards PT goals: Progressing toward goals    Frequency    7X/week      PT Plan Current plan remains appropriate    Co-evaluation              AM-PAC PT "6 Clicks" Mobility   Outcome Measure  Help needed turning from your back to your side while in a flat bed without using bedrails?: A Lot Help needed moving from lying on your back to sitting on the side of a flat bed without using bedrails?: A Lot Help needed moving to and from a bed to a chair (including a wheelchair)?: A Lot Help needed standing up from a chair using your arms (e.g., wheelchair or bedside chair)?: A Lot Help needed to walk in hospital room?: A Lot Help needed climbing 3-5 steps with a railing? : A Lot 6 Click Score: 12    End of Session Equipment Utilized During Treatment: Gait belt Activity Tolerance: Patient tolerated treatment well Patient left: with call bell/phone within reach;with family/visitor present;in chair Nurse Communication: Mobility status PT Visit Diagnosis: Pain;Other abnormalities of gait and mobility (R26.89);Difficulty in walking, not elsewhere classified (R26.2) Pain - Right/Left: Left Pain - part of body: Knee     Time: 8841-6606 PT Time Calculation (min) (ACUTE ONLY): 24 min  Charges:  $Gait Training: 8-22 mins $Therapeutic Activity: 8-22 mins                     {Kinda Pottle  PTA Acute  Rehabilitation Services Pager      [phone] Office      [phone]

## 2021-02-03 NOTE — Progress Notes (Signed)
Patient ID: Alexis Olson, female   DOB: [birth year], 87 y.o.   MRN: [id number] Subjective: 9 Days Post-Op Procedure(s) (LRB): TOTAL KNEE ARTHROPLASTY (Left)    Patient reports pain as mild.  Still limited activity BUT for the first time she expresses a desire to go home today.  Objective:   VITALS:   Vitals:   02/02/21 2032 02/03/21 0500  BP:  (!) 108/54  Pulse:  (!) 52  Resp:  16  Temp:  98.4 F (36.9 C)  SpO2: 96% 96%    Neurovascular intact Incision: dressing C/D/I  LABS Recent Labs    02/01/21 0707  HGB 8.4*  HCT 26.1*  WBC 8.6  PLT 265    No results for input(s): NA, K, BUN, CREATININE, GLUCOSE in the last 72 hours.  No results for input(s): LABPT, INR in the last 72 hours.   Assessment/Plan: 9 Days Post-Op Procedure(s) (LRB): TOTAL KNEE ARTHROPLASTY (Left)   Up with therapy Please work with her today to accomplish goal of getting her home Minimal safe requirements to have her transition to home RTC in next week

## 2021-02-07 DIAGNOSIS — K219 Gastro-esophageal reflux disease without esophagitis: Secondary | ICD-10-CM | POA: Diagnosis not present

## 2021-02-07 DIAGNOSIS — E785 Hyperlipidemia, unspecified: Secondary | ICD-10-CM | POA: Diagnosis not present

## 2021-02-07 DIAGNOSIS — Z8701 Personal history of pneumonia (recurrent): Secondary | ICD-10-CM | POA: Diagnosis not present

## 2021-02-07 DIAGNOSIS — I4891 Unspecified atrial fibrillation: Secondary | ICD-10-CM | POA: Diagnosis not present

## 2021-02-07 DIAGNOSIS — Z96651 Presence of right artificial knee joint: Secondary | ICD-10-CM | POA: Diagnosis not present

## 2021-02-07 DIAGNOSIS — M5137 Other intervertebral disc degeneration, lumbosacral region: Secondary | ICD-10-CM | POA: Diagnosis not present

## 2021-02-07 DIAGNOSIS — Z7901 Long term (current) use of anticoagulants: Secondary | ICD-10-CM | POA: Diagnosis not present

## 2021-02-07 DIAGNOSIS — D649 Anemia, unspecified: Secondary | ICD-10-CM | POA: Diagnosis not present

## 2021-02-07 DIAGNOSIS — G43909 Migraine, unspecified, not intractable, without status migrainosus: Secondary | ICD-10-CM | POA: Diagnosis not present

## 2021-02-07 DIAGNOSIS — Z96652 Presence of left artificial knee joint: Secondary | ICD-10-CM | POA: Diagnosis not present

## 2021-02-07 DIAGNOSIS — M1120 Other chondrocalcinosis, unspecified site: Secondary | ICD-10-CM | POA: Diagnosis not present

## 2021-02-07 DIAGNOSIS — I447 Left bundle-branch block, unspecified: Secondary | ICD-10-CM | POA: Diagnosis not present

## 2021-02-07 DIAGNOSIS — Z86718 Personal history of other venous thrombosis and embolism: Secondary | ICD-10-CM | POA: Diagnosis not present

## 2021-02-07 DIAGNOSIS — F419 Anxiety disorder, unspecified: Secondary | ICD-10-CM | POA: Diagnosis not present

## 2021-02-07 DIAGNOSIS — Z9884 Bariatric surgery status: Secondary | ICD-10-CM | POA: Diagnosis not present

## 2021-02-07 DIAGNOSIS — G4700 Insomnia, unspecified: Secondary | ICD-10-CM | POA: Diagnosis not present

## 2021-02-07 DIAGNOSIS — Z87440 Personal history of urinary (tract) infections: Secondary | ICD-10-CM | POA: Diagnosis not present

## 2021-02-07 DIAGNOSIS — J4530 Mild persistent asthma, uncomplicated: Secondary | ICD-10-CM | POA: Diagnosis not present

## 2021-02-07 DIAGNOSIS — E538 Deficiency of other specified B group vitamins: Secondary | ICD-10-CM | POA: Diagnosis not present

## 2021-02-07 DIAGNOSIS — Z471 Aftercare following joint replacement surgery: Secondary | ICD-10-CM | POA: Diagnosis not present

## 2021-02-07 DIAGNOSIS — G8929 Other chronic pain: Secondary | ICD-10-CM | POA: Diagnosis not present

## 2021-02-07 DIAGNOSIS — Z9181 History of falling: Secondary | ICD-10-CM | POA: Diagnosis not present

## 2021-02-07 DIAGNOSIS — E669 Obesity, unspecified: Secondary | ICD-10-CM | POA: Diagnosis not present

## 2021-02-07 DIAGNOSIS — Z86010 Personal history of colonic polyps: Secondary | ICD-10-CM | POA: Diagnosis not present

## 2021-02-08 ENCOUNTER — Other Ambulatory Visit: Payer: Self-pay | Admitting: Family Medicine

## 2021-02-08 ENCOUNTER — Telehealth: Payer: Self-pay | Admitting: Family Medicine

## 2021-02-08 ENCOUNTER — Other Ambulatory Visit (HOSPITAL_COMMUNITY): Payer: Self-pay

## 2021-02-08 DIAGNOSIS — Z471 Aftercare following joint replacement surgery: Secondary | ICD-10-CM | POA: Diagnosis not present

## 2021-02-08 DIAGNOSIS — Z8739 Personal history of other diseases of the musculoskeletal system and connective tissue: Secondary | ICD-10-CM | POA: Diagnosis not present

## 2021-02-08 DIAGNOSIS — Z96652 Presence of left artificial knee joint: Secondary | ICD-10-CM | POA: Diagnosis not present

## 2021-02-08 MED ORDER — NYSTATIN 100000 UNIT/ML MT SUSP
OROMUCOSAL | 1 refills | Status: DC
  Filled 2021-02-08: qty 160, 4d supply, fill #0

## 2021-02-08 NOTE — Telephone Encounter (Signed)
Pt stated her mouth is sore and would like to know if Alexis Olson could prescribe her Magic Mouthwash. Please advise.

## 2021-02-09 ENCOUNTER — Telehealth: Payer: Self-pay

## 2021-02-09 NOTE — Telephone Encounter (Signed)
Medication was sent in by someone else yesterday

## 2021-02-09 NOTE — Telephone Encounter (Signed)
Transition Care Management Follow-up Telephone Call Date of discharge and from where: 02/03/2021 / [facility] How have you been since you were released from the hospital? " Terrible.  Left knee hurts"  Any questions or concerns? No  Items Reviewed: Did the pt receive and understand the discharge instructions provided? Yes  Medications obtained and verified? Yes  Other? No  Any new allergies since your discharge? No  Dietary orders reviewed? Yes Do you have support at home? Yes   Home Care and Equipment/Supplies: Were home health services ordered? yes If so, what is the name of the agency? [facility]   Has the agency set up a time to come to the patient's home? yes Were any new equipment or medical supplies ordered?  No  ( patient states she already has a cane, walker, and bedside commode) What is the name of the medical supply agency? N/a Were you able to get the supplies/equipment? not applicable Do you have any questions related to the use of the equipment or supplies? No  Functional Questionnaire: (I = Independent and D = Dependent) ADLs: I  Bathing/Dressing- I  Meal Prep- D  Eating- I  Maintaining continence- I  Transferring/Ambulation- I  Managing Meds- I  Follow up appointments reviewed:  PCP Hospital f/u appt confirmed? Yes  Scheduled to see Mackie Pai on 03/25/2021 @ 8 am. [facility] f/u appt confirmed? Yes  Scheduled to see Dr. Raquel Sarna on 02/11/2021 @ 1:30 pm. Are transportation arrangements needed? No  If their condition worsens, is the pt aware to call PCP or go to the Emergency Dept.? Yes Was the patient provided with contact information for the PCP's office or ED? Yes Was to pt encouraged to call back with questions or concerns? Yes  Quinn Plowman RN,BSN,CCM RN Case Manager [place] [phone]

## 2021-02-10 ENCOUNTER — Other Ambulatory Visit (HOSPITAL_COMMUNITY): Payer: Self-pay

## 2021-02-10 MED ORDER — VITAMIN D (ERGOCALCIFEROL) 1.25 MG (50000 UNIT) PO CAPS
ORAL_CAPSULE | ORAL | 0 refills | Status: DC
  Filled 2021-02-10: qty 12, 84d supply, fill #0

## 2021-02-10 NOTE — Telephone Encounter (Signed)
Would you like Pt to continue Ergocalciferol?  

## 2021-02-11 ENCOUNTER — Other Ambulatory Visit (HOSPITAL_COMMUNITY): Payer: Self-pay

## 2021-02-11 MED ORDER — OXYCODONE HCL 5 MG PO TABS
ORAL_TABLET | ORAL | 0 refills | Status: DC
  Filled 2021-02-11: qty 42, 7d supply, fill #0

## 2021-02-11 NOTE — Discharge Summary (Signed)
Physician Discharge Summary   Patient ID: Alexis Olson MRN: [id number] DOB/AGE: Feb 12, 1934 87 y.o.  Admit date: 01/25/2021 Discharge date: 02/03/2021  Primary Diagnosis: Left knee osteoarthritis.   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 difficile diarrhea    Colon perforation ([place]) 08/2014   following polypetomy during colo, surgical repair   COLONIC POLYPS, ADENOMATOUS, HX OF 2010   DIVERTICULOSIS, COLON    DVT (deep venous thrombosis) ([place]) 2018   DVT (deep venous thrombosis) (HCC)    GERD (gastroesophageal reflux disease) 08/23/2015   Graded compression stocking in place    History of blood transfusion    History of migraine    History of rheumatic fever    HSV (herpes simplex virus) infection 08/16/2015   Hyperlipidemia    Hypertension    Denies   Hyponatremia 05/02/2016   Incomplete left bundle branch block (LBBB) 2016   Noted on EKG   INSOMNIA    Lumbosacral disc disease    Chronic pain; History of osteomyelitis 2010 following ESI complication   Obesity    Osteomyelitis (HCC)    Pelvic fracture (HCC)    Pneumonia    Recurrent UTI 08/23/2015   URINARY URGENCY    Ventral hernia without obstruction or gangrene 05/22/2018   VITAMIN B12 DEFICIENCY    Discharge Diagnoses:   Active Problems:   S/P total knee arthroplasty, left  Estimated body mass index is 29.56 kg/m as calculated from the following:   Height as of this encounter: 5\' 4"  (1.626 m).   Weight as of this encounter: 78.1 kg.  Procedure:  Procedure(s) (LRB): TOTAL KNEE ARTHROPLASTY (Left)   Consults: None  HPI: Alexis Olson is a 87 y.o. female patient of   mine.  The patient had been seen, evaluated, and treated for months conservatively in the   office with medication,  activity modification, and injections.  The patient had   radiographic changes of bone-on-bone arthritis with endplate sclerosis and osteophytes noted.  Based on the radiographic changes and failed conservative measures, the patient   decided to proceed with definitive treatment, total knee replacement.  Risks of infection, DVT, component failure, need for revision surgery, neurovascular injury were reviewed in the office setting.  The postop course was reviewed stressing the efforts to maximize post-operative satisfaction and function.  Consent was obtained for benefit of pain   relief.   Laboratory Data: Admission on 01/25/2021, Discharged on 02/03/2021  Component Date Value Ref Range Status   WBC 01/26/2021 10.7 (H)  4.0 - 10.5 K/uL Final   RBC 01/26/2021 2.80 (L)  3.87 - 5.11 MIL/uL Final   Hemoglobin 01/26/2021 8.7 (L)  12.0 - 15.0 g/dL Final   HCT 01/26/2021 27.8 (L)  36.0 - 46.0 % Final   MCV 01/26/2021 99.3  80.0 - 100.0 fL Final   MCH 01/26/2021 31.1  26.0 - 34.0 pg Final   MCHC 01/26/2021 31.3  30.0 - 36.0 g/dL Final   RDW 01/26/2021 13.3  11.5 - 15.5 % Final   Platelets 01/26/2021 214  150 - 400 K/uL Final   nRBC 01/26/2021 0.0  0.0 - 0.2 % Final   Performed at [facility], [place] [address] [place],  [place] [address]   Sodium 01/26/2021 130 (L)  135 - 145 mmol/L Final   Potassium 01/26/2021 4.5  3.5 - 5.1 mmol/L Final   Chloride 01/26/2021 99  98 - 111 mmol/L Final   CO2 01/26/2021 25  22 - 32 mmol/L Final   Glucose, Bld 01/26/2021 118 (H)  70 - 99 mg/dL Final   Glucose reference range applies only to samples taken after fasting for at least 8 hours.   BUN 01/26/2021 14  8 - 23 mg/dL Final   Creatinine, Ser 01/26/2021 0.50  0.44 - 1.00 mg/dL Final   Calcium 01/26/2021 8.0 (L)  8.9 - 10.3 mg/dL Final   GFR, Estimated 01/26/2021 >60  >60 mL/min Final   Comment: (NOTE) Calculated using the CKD-EPI Creatinine Equation (2021)    Anion gap 01/26/2021 6  5 - 15  Final   Performed at [facility], [place] [address], [place], [place] [address]   WBC 01/27/2021 8.8  4.0 - 10.5 K/uL Final   RBC 01/27/2021 2.63 (L)  3.87 - 5.11 MIL/uL Final   Hemoglobin 01/27/2021 8.1 (L)  12.0 - 15.0 g/dL Final   HCT 01/27/2021 26.2 (L)  36.0 - 46.0 % Final   MCV 01/27/2021 99.6  80.0 - 100.0 fL Final   MCH 01/27/2021 30.8  26.0 - 34.0 pg Final   MCHC 01/27/2021 30.9  30.0 - 36.0 g/dL Final   RDW 01/27/2021 13.7  11.5 - 15.5 % Final   Platelets 01/27/2021 194  150 - 400 K/uL Final   nRBC 01/27/2021 0.0  0.0 - 0.2 % Final   Performed at [facility], [place] [address], [place], [place] [address]   WBC 01/28/2021 8.8  4.0 - 10.5 K/uL Final   RBC 01/28/2021 2.34 (L)  3.87 - 5.11 MIL/uL Final   Hemoglobin 01/28/2021 7.4 (L)  12.0 - 15.0 g/dL Final   HCT 01/28/2021 23.0 (L)  36.0 - 46.0 % Final   MCV 01/28/2021 98.3  80.0 - 100.0 fL Final   MCH 01/28/2021 31.6  26.0 - 34.0 pg Final   MCHC 01/28/2021 32.2  30.0 - 36.0 g/dL Final   RDW 01/28/2021 13.9  11.5 - 15.5 % Final   Platelets 01/28/2021 203  150 - 400 K/uL Final   nRBC 01/28/2021 0.0  0.0 - 0.2 % Final   Performed at [facility], [place] [address] [place], [place] [address]   ABO/RH(D) 01/28/2021 A NEG   Final   Antibody Screen 01/28/2021 NEG   Final   Sample Expiration 01/28/2021 01/31/2021,2359   Final   Unit Number 01/28/2021 [id number]   Final   Blood Component Type 01/28/2021 RED CELLS,LR   Final   Unit division 01/28/2021 00   Final   Status of Unit 01/28/2021 ISSUED,FINAL   Final   Transfusion Status 01/28/2021 OK TO TRANSFUSE   Final   Crossmatch Result 01/28/2021 Compatible   Final   Unit Number 01/28/2021 [id number]   Final   Blood Component Type 01/28/2021 RED CELLS,LR   Final   Unit division 01/28/2021 00   Final   Status of Unit 01/28/2021 ISSUED,FINAL   Final   Transfusion Status 01/28/2021 OK TO TRANSFUSE   Final   Crossmatch Result  01/28/2021    Final                   Value:Compatible Performed at [facility], [place] [address], [place],  [address]    Order Confirmation  01/28/2021    Final                   Value:ORDER PROCESSED BY BLOOD BANK Performed at [place] [address] [place], [place] [address]    ISSUE DATE / TIME 01/28/2021 [id number]   Final   Blood Product Unit Number 01/28/2021 [id number]   Final   PRODUCT CODE 01/28/2021 [id number]   Final   Unit Type and Rh 01/28/2021 0600   Final   Blood Product Expiration Date 01/28/2021 [id number]   Final   ISSUE DATE / TIME 01/28/2021 [id number]   Final   Blood Product Unit Number 01/28/2021 [id number]   Final   PRODUCT CODE 01/28/2021 [id number]   Final   Unit Type and Rh 01/28/2021 0600   Final   Blood Product Expiration Date 01/28/2021 [id number]   Final   Hemoglobin 01/29/2021 8.7 (L)  12.0 - 15.0 g/dL Final   HCT 01/29/2021 26.7 (L)  36.0 - 46.0 % Final   Performed at [facility], [place] [address] [place], [place] [address]   WBC 02/01/2021 8.6  4.0 - 10.5 K/uL Final   RBC 02/01/2021 2.81 (L)  3.87 - 5.11 MIL/uL Final   Hemoglobin 02/01/2021 8.4 (L)  12.0 - 15.0 g/dL Final   HCT 02/01/2021 26.1 (L)  36.0 - 46.0 % Final   MCV 02/01/2021 92.9  80.0 - 100.0 fL Final   MCH 02/01/2021 29.9  26.0 - 34.0 pg Final   MCHC 02/01/2021 32.2  30.0 - 36.0 g/dL Final   RDW 02/01/2021 15.5  11.5 - 15.5 % Final   Platelets 02/01/2021 265  150 - 400 K/uL Final   nRBC 02/01/2021 0.0  0.0 - 0.2 % Final   Performed at [facility] [place] [address] [place], [place] [address]  Orders Only on 01/21/2021  Component Date Value Ref Range Status   SARS Coronavirus 2 01/21/2021 RESULT: NEGATIVE   Final   Comment: RESULT: NEGATIVESARS-CoV-2 INTERPRETATION:A NEGATIVE  test result means that SARS-CoV-2 RNA was not present in the specimen above the limit of detection of this test. This  does not preclude a possible SARS-CoV-2 infection and should not be used as the  sole basis for patient management decisions. Negative results must be combined with clinical observations, patient history, and epidemiological information. Optimum specimen types and timing for peak viral levels during infections caused by SARS-CoV-2  have not been determined. Collection of multiple specimens or types of specimens may be necessary to detect virus. Improper specimen collection and handling, sequence variability under primers/probes, or organism present below the limit of detection may  lead to false negative results. Positive and negative predictive values of testing are highly dependent on prevalence. False negative test results are more likely when prevalence of disease is high.The expected result is NEGATIVE.Fact S                          heet for  Healthcare Providers: LocalChronicle.no Sheet for Patients: SalonLookup.es Reference Range - Negative   Hospital Outpatient Visit on 01/17/2021  Component Date Value Ref Range Status   MRSA, PCR 01/17/2021 NEGATIVE  NEGATIVE Final   Staphylococcus aureus 01/17/2021 NEGATIVE  NEGATIVE Final   Comment: (NOTE) The Xpert SA Assay (FDA approved for NASAL specimens in patients 50 years of age and older), is one component of a comprehensive surveillance program. It is not intended to diagnose infection nor to guide or  monitor treatment. Performed at [facility], [place] [address], [place], [place] [address]    WBC 01/17/2021 7.3  4.0 - 10.5 K/uL Final   RBC 01/17/2021 3.54 (L)  3.87 - 5.11 MIL/uL Final   Hemoglobin 01/17/2021 11.0 (L)  12.0 - 15.0 g/dL Final   HCT 01/17/2021 34.6 (L)  36.0 - 46.0 % Final   MCV 01/17/2021 97.7  80.0 - 100.0 fL Final   MCH 01/17/2021 31.1  26.0 - 34.0 pg Final   MCHC 01/17/2021 31.8  30.0 - 36.0 g/dL Final   RDW 01/17/2021 14.0  11.5 - 15.5 %  Final   Platelets 01/17/2021 251  150 - 400 K/uL Final   nRBC 01/17/2021 0.0  0.0 - 0.2 % Final   Performed at [facility], [place] [address], [place], [place] [address]   Sodium 01/17/2021 138  135 - 145 mmol/L Final   Potassium 01/17/2021 4.2  3.5 - 5.1 mmol/L Final   Chloride 01/17/2021 105  98 - 111 mmol/L Final   CO2 01/17/2021 28  22 - 32 mmol/L Final   Glucose, Bld 01/17/2021 93  70 - 99 mg/dL Final   Glucose reference range applies only to samples taken after fasting for at least 8 hours.   BUN 01/17/2021 15  8 - 23 mg/dL Final   Creatinine, Ser 01/17/2021 0.38 (L)  0.44 - 1.00 mg/dL Final   Calcium 01/17/2021 9.0  8.9 - 10.3 mg/dL Final   Total Protein 01/17/2021 6.7  6.5 - 8.1 g/dL Final   Albumin 01/17/2021 3.8  3.5 - 5.0 g/dL Final   AST 01/17/2021 16  15 - 41 U/L Final   ALT 01/17/2021 17  0 - 44 U/L Final   Alkaline Phosphatase 01/17/2021 52  38 - 126 U/L Final   Total Bilirubin 01/17/2021 0.3  0.3 - 1.2 mg/dL Final   GFR, Estimated 01/17/2021 >60  >60 mL/min Final   Comment: (NOTE) Calculated using the CKD-EPI Creatinine Equation (2021)    Anion gap 01/17/2021 5  5 - 15 Final   Performed at [facility], [place] [address] [place], [place] [address]   ABO/RH(D) 01/17/2021 A NEG   Final   Antibody Screen 01/17/2021 NEG   Final   Sample Expiration 01/17/2021 01/28/2021,2359   Final   Extend sample reason 01/17/2021    Final                   Value:NO TRANSFUSIONS OR PREGNANCY IN THE PAST 3 MONTHS Performed at [place] [address] [place], [place] [address]      X-Rays:DG CHEST PORT 1 VIEW  Result Date: 01/30/2021 CLINICAL DATA:  Shortness of breath, history of recent left knee replacement EXAM: PORTABLE CHEST 1 VIEW COMPARISON:  07/11/2020 FINDINGS: Cardiac shadow is within normal limits. Mild left basilar atelectasis is noted. No focal infiltrate or sizable effusion is noted. No bony abnormality is seen.  IMPRESSION: Mild left basilar atelectasis. Electronically Signed   By: Inez Catalina M.D.   On: 01/30/2021 20:22    EKG: Orders placed or performed during the hospital encounter of 07/11/20   EKG 12-Lead   EKG 12-Lead   EKG 12-Lead   EKG 12-Lead   *Note: Due to a large number of results and/or encounters for the requested time period, some results have not been displayed. A complete set of results can be found in Results Review.     Hospital Course: Nelline Lio [facility]  is a 87 y.o. who was admitted to [facility]. They were brought to the operating room on 01/25/2021 and underwent Procedure(s): TOTAL KNEE ARTHROPLASTY.  Patient tolerated the procedure well and was later transferred to the recovery room and then to the orthopaedic floor for postoperative care. They were given PO and IV analgesics for pain control following their surgery. They were given 24 hours of postoperative antibiotics of  Anti-infectives (From admission, onward)    Start     Dose/Rate Route Frequency Ordered Stop   01/25/21 1630  ceFAZolin (ANCEF) IVPB 2g/100 mL premix        2 g 200 mL/hr over 30 Minutes Intravenous Every 6 hours 01/25/21 1329 01/26/21 0011   01/25/21 1329  valACYclovir (VALTREX) tablet 500 mg  Status:  Discontinued        500 mg Oral Daily PRN 01/25/21 1329 02/03/21 2107   01/25/21 0815  ceFAZolin (ANCEF) IVPB 2g/100 mL premix        2 g 200 mL/hr over 30 Minutes Intravenous On call to O.R. 01/25/21 0804 01/25/21 1008      and started on DVT prophylaxis in the form of  Eliquis .   PT and OT were ordered for total joint protocol. Discharge planning consulted to help with postop disposition and equipment needs. Patient had a fair night on the evening of surgery. They started to get up OOB with therapy on POD #0 but was limited by weakness/lightheadedness, which improved somewhat with fluid boluses. She continued to have slow progression with PT on POD #1. On POD #2, her hemoglobin was 7.4 and we did  decide to transfuse 2 units due to symptomatic anemia. Continued working with PT on POD #5 and #6 without enough progression with therapy to discharge home safely. On POD #7 and #8, she was making progress in terms of physical therapy.  Pt was seen during rounds on day nine and was ready to go home pending progress with therapy. Pt worked with therapy for two additional sessions and was meeting their goals. She was discharged to home later that day in stable condition.  Diet: Regular diet Activity: WBAT Follow-up: in 2 weeks Disposition: Home Discharged Condition: good   Discharge Instructions     Call MD / Call 911   Complete by: As directed    If you experience chest pain or shortness of breath, CALL 911 and be transported to the hospital emergency room.  If you develope a fever above 101 F, pus (white drainage) or increased drainage or redness at the wound, or calf pain, call your surgeon's office.   Change dressing   Complete by: As directed    Maintain surgical dressing until follow up in the clinic. If the edges start to pull up, may reinforce with tape. If the dressing is no longer working, may remove and cover with gauze and tape, but must keep the area dry and clean.  Call with any questions or concerns.   Constipation Prevention   Complete by: As directed    Drink plenty of fluids.  Prune juice may be helpful.  You may use a stool softener, such as Colace (over the counter) 100 mg twice a day.  Use MiraLax (over the counter) for constipation as needed.   Diet - low sodium heart healthy   Complete by: As directed    Increase activity slowly as tolerated   Complete by: As directed    Weight bearing as tolerated with assist device (walker, cane,  etc) as directed, use it as long as suggested by your surgeon or therapist, typically at least 4-6 weeks.   Post-operative opioid taper instructions:   Complete by: As directed    POST-OPERATIVE OPIOID TAPER INSTRUCTIONS: It is important to  wean off of your opioid medication as soon as possible. If you do not need pain medication after your surgery it is ok to stop day one. Opioids include: Codeine, Hydrocodone(Norco, Vicodin), Oxycodone(Percocet, oxycontin) and hydromorphone amongst others.  Long term and even short term use of opiods can cause: Increased pain response Dependence Constipation Depression Respiratory depression And more.  Withdrawal symptoms can include Flu like symptoms Nausea, vomiting And more Techniques to manage these symptoms Hydrate well Eat regular healthy meals Stay active Use relaxation techniques(deep breathing, meditating, yoga) Do Not substitute Alcohol to help with tapering If you have been on opioids for less than two weeks and do not have pain than it is ok to stop all together.  Plan to wean off of opioids This plan should start within one week post op of your joint replacement. Maintain the same interval or time between taking each dose and first decrease the dose.  Cut the total daily intake of opioids by one tablet each day Next start to increase the time between doses. The last dose that should be eliminated is the evening dose.      TED hose   Complete by: As directed    Use stockings (TED hose) for 2 weeks on both leg(s).  You may remove them at night for sleeping.      Allergies as of 02/03/2021       Reactions   Ativan [lorazepam] Anaphylaxis, Other (See Comments)   Deathly allergic; "resp rate dropped dropped to 6"   Other Anaphylaxis   Red peppers--choking, also   Azithromycin Diarrhea   Benadryl [diphenhydramine Hcl] Other (See Comments)   Paradoxical response   Ciprofloxacin Hcl Other (See Comments)   Causes yeast infection and refuses to take   Codeine Phosphate Nausea Only   Can tolerate in limited amounts   Hydrocodone Itching, Nausea And Vomiting   Hydrocodone Bit-homatrop Mbr Itching, Nausea And Vomiting   Levaquin [levofloxacin In D5w] Other (See  Comments)   Muscle soreness   Meperidine Hcl Other (See Comments)   Reaction ??   Molds & Smuts Other (See Comments)   unsure   Sulfa Antibiotics Other (See Comments)   Joint pain   Tape Other (See Comments)   No Coban wrap (per the patient)   Ultram [tramadol Hcl] Other (See Comments)   "Can't move joints"        Medication List     STOP taking these medications    aspirin EC 81 MG tablet       TAKE these medications    acetaminophen 500 MG tablet Commonly known as: TYLENOL Take 2 tablets (1,000 mg total) by mouth every 8 (eight) hours. What changed:  when to take this reasons to take this   albuterol 108 (90 Base) MCG/ACT inhaler Commonly known as: VENTOLIN HFA Inhale 1-2 puffs into the lungs every 6 (six) hours as needed for wheezing or shortness of breath. Notes to patient: Last dose given 11/06 06:37pm   ALPRAZolam 0.5 MG tablet Commonly known as: Xanax Take 1 tablet by mouth at bedtime as needed for anxiety. Notes to patient: Last dose given 11/08 11:41pm   amitriptyline 100 MG tablet Commonly known as: ELAVIL Take 1 tablet (100 mg total) by mouth  daily.   azelastine 0.05 % ophthalmic solution Commonly known as: OPTIVAR Place 1 drop into both eyes daily as needed (for dry eyes). Notes to patient: Resume home regimen   benzonatate 100 MG capsule Commonly known as: TESSALON TAKE 1 CAPSULE BY MOUTH 3 TIMES DAILY AS NEEDED FOR COUGH What changed:  how much to take how to take this when to take this reasons to take this Notes to patient: Last dose given 11/06 09:13am   budesonide-formoterol 160-4.5 MCG/ACT inhaler Commonly known as: SYMBICORT Inhale 2 puffs into the lungs See admin instructions. Inhale 2 puffs into the lungs one to two times a day Notes to patient: Resume home regimen   Centratex 106-1 MG Caps TAKE 1 CAPSULE BY MOUTH 2 TIMES DAILY Notes to patient: Resume home regimen   Hemocyte Plus 106-1 MG Caps TAKE 1 CAPSULE BY MOUTH TWO  TIMES DAILY Notes to patient: Resume home regimen   cetirizine 10 MG tablet Commonly known as: ZYRTEC Take 10 mg by mouth daily. Notes to patient: Resume home regimen   cyanocobalamin 1000 MCG/ML injection Commonly known as: (VITAMIN B-12) INJECT 1CC INTO THE MUSCLE EVERY MONTH What changed:  how much to take how to take this when to take this Notes to patient: Resume home regimen   dicyclomine 10 MG capsule Commonly known as: BENTYL Take 1 capsule (10 mg total) by mouth 3 (three) times daily as needed for diarrhea and symptoms.   diphenoxylate-atropine 2.5-0.025 MG tablet Commonly known as: LOMOTIL Take 1 tablet by mouth 3 times daily as needed for diarrhea or loose stools. Notes to patient: Resume home regimen   diphenoxylate-atropine 2.5-0.025 MG tablet Commonly known as: LOMOTIL Take 1 tablet by mouth 3 times daily as needed for diarrhea or loose stools. Notes to patient: Resume home regimen   docusate sodium 100 MG capsule Commonly known as: COLACE Take 1 capsule (100 mg total) by mouth 2 (two) times daily.   Eliquis 2.5 MG Tabs tablet Generic drug: apixaban Take 1 tablet (2.5 mg total) by mouth every 12 (twelve) hours for 28 days. Then may resume normal dose of aspirin   fluconazole 150 MG tablet Commonly known as: DIFLUCAN Take 1 tablet (150 mg total) by mouth once a week. Notes to patient: Resume home regimen   fluticasone 50 MCG/ACT nasal spray Commonly known as: FLONASE Place 2 sprays into both nostrils daily.   gabapentin 300 MG capsule Commonly known as: NEURONTIN TAKE 1 CAPSULE BY MOUTH AT BEDTIME What changed: how much to take   hydrOXYzine 25 MG tablet Commonly known as: ATARAX/VISTARIL Take 25 mg by mouth 3 (three) times daily as needed for itching. Notes to patient: Last dose given 11/03 05:50am   levocetirizine 5 MG tablet Commonly known as: XYZAL Take 1 tablet by mouth in the evening What changed:  how much to take how to take  this when to take this Notes to patient: Resume home regimen   magnesium oxide 400 MG tablet Commonly known as: MAG-OX Take 400 mg by mouth 2 (two) times daily.   methocarbamol 500 MG tablet Commonly known as: ROBAXIN Take 1 tablet (500 mg total) by mouth every 6 (six) hours as needed for muscle spasms.   metoprolol succinate 25 MG 24 hr tablet Commonly known as: Toprol XL Take 1 tablet (25 mg total) by mouth daily.   montelukast 10 MG tablet Commonly known as: SINGULAIR Take 10 mg by mouth at bedtime. What changed: Another medication with the same name was changed.  Make sure you understand how and when to take each.   montelukast 10 MG tablet Commonly known as: SINGULAIR Take 1 tablet by mouth in the evening (Take 1 tablet by mouth in the evening) What changed:  how much to take how to take this when to take this   omeprazole 20 MG capsule Commonly known as: PRILOSEC Take 1 capsule (20 mg total) by mouth daily.   ondansetron 4 MG disintegrating tablet Commonly known as: ZOFRAN-ODT Take 1 tablet (4 mg total) by mouth 3 (three) times daily as needed for nausea or vomiting. Notes to patient: Resume home regimen   oxyCODONE 5 MG immediate release tablet Commonly known as: Oxy IR/ROXICODONE Take 1-2 tablets (5-10 mg total) by mouth every 6 (six) hours as needed for severe pain (pain score 4-6). Notes to patient: Last dose given 11/08 10:33am   Pfizer-BioNT COVID-19 Vac-TriS Susp injection Generic drug: COVID-19 mRNA Vac-TriS (Pfizer) Inject into the muscle.   phenazopyridine 100 MG tablet Commonly known as: Pyridium Take 1 tablet (100 mg total) by mouth 3 (three) times daily as needed for pain. Notes to patient: Resume home regimen   polyethylene glycol powder 17 GM/SCOOP powder Commonly known as: GLYCOLAX/MIRALAX Mix 17 g in 4 oz of water or juice and drink by mouth daily as needed for mild constipation. Notes to patient: Last dose given 11/06 11:41am    potassium chloride SA 20 MEQ tablet Commonly known as: KLOR-CON Take 1 tablet (20 mEq total) by mouth daily.   PROBIOTIC DAILY PO Take 1 capsule by mouth daily. Notes to patient: Resume home regimen   valACYclovir 500 MG tablet Commonly known as: VALTREX TAKE 1 TABLET BY MOUTH ONCE A DAY AS NEEDED. What changed:  how much to take reasons to take this Notes to patient: Resume home regimen   zolpidem 5 MG tablet Commonly known as: AMBIEN TAKE 1 TABLET BY MOUTH AT BEDTIME What changed: how much to take               Discharge Care Instructions  (From admission, onward)           Start     Ordered   02/02/21 0000  Change dressing       Comments: Maintain surgical dressing until follow up in the clinic. If the edges start to pull up, may reinforce with tape. If the dressing is no longer working, may remove and cover with gauze and tape, but must keep the area dry and clean.  Call with any questions or concerns.   02/02/21 0757            Follow-up Information     Paralee Cancel, MD. Go on 02/09/2021.   Specialty: Orthopedic Surgery Why: You are scheduled for a follow up appointment on 02-09-21 at 2:15 pm. Contact information: [address] [place] [address] [phone]         Care, [facility] Follow up.   Specialty: [place] Why: For home PT Contact information: [place] [address] [phone]                 Signed: Griffith Citron, PA-C Orthopedic Surgery 02/11/2021, 8:09 AM

## 2021-02-14 ENCOUNTER — Other Ambulatory Visit (HOSPITAL_COMMUNITY): Payer: Self-pay

## 2021-02-15 ENCOUNTER — Other Ambulatory Visit (HOSPITAL_COMMUNITY): Payer: Self-pay

## 2021-02-15 MED ORDER — LEVOCETIRIZINE DIHYDROCHLORIDE 5 MG PO TABS
5.0000 mg | ORAL_TABLET | Freq: Every evening | ORAL | 0 refills | Status: DC
  Filled 2021-02-15: qty 90, 90d supply, fill #0

## 2021-02-16 ENCOUNTER — Telehealth: Payer: Self-pay | Admitting: Family Medicine

## 2021-02-16 NOTE — Telephone Encounter (Signed)
Patient's husband is calling because the patient has been feeling sick and tired ever since she got discharged. They want to get her lab work done today for her. He was informed that lab orders needed to be put in before labs could be drawn, and that she had an appointment on 11/29 for a hospital follow up. He stated that she is too weak and needed lab now. Please advice.

## 2021-02-16 NOTE — Telephone Encounter (Addendum)
Spoke with patient and she is concerned about her hemoglobin.  When she left the hospital it was 8.4.  she just feel so tired to even go across the room.

## 2021-02-20 ENCOUNTER — Other Ambulatory Visit (HOSPITAL_COMMUNITY): Payer: Self-pay

## 2021-02-21 ENCOUNTER — Other Ambulatory Visit: Payer: Self-pay

## 2021-02-21 ENCOUNTER — Other Ambulatory Visit (HOSPITAL_COMMUNITY): Payer: Self-pay

## 2021-02-21 DIAGNOSIS — D649 Anemia, unspecified: Secondary | ICD-10-CM

## 2021-02-21 NOTE — Telephone Encounter (Signed)
Lvm that labs were placed and she could go get them done when she gets a chance

## 2021-02-22 ENCOUNTER — Other Ambulatory Visit: Payer: Self-pay

## 2021-02-22 ENCOUNTER — Other Ambulatory Visit (HOSPITAL_COMMUNITY): Payer: Self-pay

## 2021-02-22 ENCOUNTER — Ambulatory Visit (INDEPENDENT_AMBULATORY_CARE_PROVIDER_SITE_OTHER): Payer: PPO | Admitting: Medical

## 2021-02-22 VITALS — BP 125/59 | HR 58 | Temp 98.2°F | Resp 18 | Ht 64.0 in | Wt 166.0 lb

## 2021-02-22 DIAGNOSIS — D649 Anemia, unspecified: Secondary | ICD-10-CM | POA: Diagnosis not present

## 2021-02-22 DIAGNOSIS — M25562 Pain in left knee: Secondary | ICD-10-CM | POA: Diagnosis not present

## 2021-02-22 DIAGNOSIS — R5383 Other fatigue: Secondary | ICD-10-CM | POA: Diagnosis not present

## 2021-02-22 MED ORDER — HYDROXYZINE HCL 25 MG PO TABS
ORAL_TABLET | ORAL | 1 refills | Status: DC
  Filled 2021-02-22: qty 90, 30d supply, fill #0
  Filled 2021-06-24: qty 90, 30d supply, fill #1

## 2021-02-22 NOTE — Progress Notes (Signed)
Subjective:    Patient ID: Alexis Olson, female    DOB: Oct 23, 1933, 87 y.o.   MRN: [id number]  HPI  Pt in for follow up from hospital. Below summary in ".  Admit date: 01/25/2021 Discharge date: 02/03/2021  Pt here by herself.   Primary Diagnosis: Left knee osteoarthritis.  HPI: Alexis Olson is a 87 y.o. female patient of   mine.  The patient had been seen, evaluated, and treated for months conservatively in the   office with medication, activity modification, and injections.  The patient had   radiographic changes of bone-on-bone arthritis with endplate sclerosis and osteophytes noted.  Based on the radiographic changes and failed conservative measures, the patient   decided to proceed with definitive treatment, total knee replacement.  Risks of infection, DVT, component failure, need for revision surgery, neurovascular injury were reviewed in the office setting.  The postop course was reviewed stressing the efforts to maximize post-operative satisfaction and function.  Consent was obtained for benefit of pain   relief.   Hospital Course: [name] is a 87 y.o. who was admitted to [facility]. They were brought to the operating room on 01/25/2021 and underwent Procedure(s): TOTAL KNEE ARTHROPLASTY.  Patient tolerated the procedure well and was later transferred to the recovery room and then to the orthopaedic floor for postoperative care. They were given PO and IV analgesics for pain control following their surgery. They were given 24 hours of postoperative antibiotics of  Anti-infectives (From admission, onward)        Start     Dose/Rate Route Frequency Ordered Stop    01/25/21 1630   ceFAZolin (ANCEF) IVPB 2g/100 mL premix        2 g 200 mL/hr over 30 Minutes Intravenous Every 6 hours 01/25/21 1329 01/26/21 0011    01/25/21 1329   valACYclovir (VALTREX) tablet 500 mg  Status:  Discontinued        500 mg Oral Daily PRN 01/25/21 1329 02/03/21 2107    01/25/21 0815    ceFAZolin (ANCEF) IVPB 2g/100 mL premix        2 g 200 mL/hr over 30 Minutes Intravenous On call to O.R. 01/25/21 0804 01/25/21 1008          and started on DVT prophylaxis in the form of  Eliquis .   PT and OT were ordered for total joint protocol. Discharge planning consulted to help with postop disposition and equipment needs. Patient had a fair night on the evening of surgery. They started to get up OOB with therapy on POD #0 but was limited by weakness/lightheadedness, which improved somewhat with fluid boluses. She continued to have slow progression with PT on POD #1. On POD #2, her hemoglobin was 7.4 and we did decide to transfuse 2 units due to symptomatic anemia. Continued working with PT on POD #5 and #6 without enough progression with therapy to discharge home safely. On POD #7 and #8, she was making progress in terms of physical therapy.  Pt was seen during rounds on day nine and was ready to go home pending progress with therapy. Pt worked with therapy for two additional sessions and was meeting their goals. She was discharged to home later that day in stable condition."    Review of Systems  Constitutional:  Negative for chills, fatigue and fever.  Respiratory:  Negative for cough, chest tightness, shortness of breath and wheezing.   Cardiovascular:  Negative for chest pain and palpitations.  Gastrointestinal:  Negative for abdominal pain.  Musculoskeletal:  Negative for back pain, neck pain and neck stiffness.       Left knee pain post surgery improving.   Skin:  Negative for color change and rash.  Neurological:  Negative for dizziness, speech difficulty, numbness and headaches.  Hematological:  Negative for adenopathy. Does not bruise/bleed easily.  Psychiatric/Behavioral:  Negative for behavioral problems and decreased concentration.     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 difficile diarrhea    Colon perforation ([place]) 08/2014   following polypetomy during colo, surgical repair   COLONIC POLYPS, ADENOMATOUS, HX OF 2010   DIVERTICULOSIS, COLON    DVT (deep venous thrombosis) ([place]) 2018   DVT (deep venous thrombosis) (H[facility])    GERD (gastroesophageal reflux disease) 08/23/2015   Graded compression stocking in place    History of blood transfusion    History of migraine    History of rheumatic fever    HSV (herpes simplex virus) infection 08/16/2015   Hyperlipidemia    Hypertension    Denies   Hyponatremia 05/02/2016   Incomplete left bundle branch block (LBBB) 2016   Noted on EKG   INSOMNIA    Lumbosacral disc disease    Chronic pain; History of osteomyelitis 2010 following ESI complication   Obesity    Osteomyelitis (HCC)    Pelvic fracture (HCC)    Pneumonia    Recurrent UTI 08/23/2015   URINARY URGENCY    Ventral hernia without obstruction or gangrene 05/22/2018   VITAMIN B12 DEFICIENCY      Social History   Socioeconomic History   Marital status: Married    Spouse name: Not on file   Number of children: 3   Years of education: Not on file   Highest education level: Not on file  Occupational History   Occupation: retired Programmer, multimedia: RETIRED  Tobacco Use   Smoking status: Never   Smokeless tobacco: Never  Vaping Use   Vaping Use: Never used  Substance and Sexual Activity   Alcohol use: No    Alcohol/week: 0.0 standard drinks   Drug use: No   Sexual activity: Never    Partners: Male  Other Topics Concern   Not on file  Social History Narrative   Lives with spouse, Indep ADLs   Supportive family nearby   No dietary restrictions   Social Determinants of Health   Financial Resource Strain: Not on file  Food Insecurity: No Food Insecurity   Worried About Charity fundraiser in the Last Year: Never true   [place] in the Last Year: Never true  Transportation Needs: No Transportation Needs   Lack of Transportation (Medical): No   Lack of Transportation (Non-Medical): No  Physical Activity: Not on file  Stress: Not on file  Social Connections: Not on file  Intimate Partner Violence: Not on file    Past Surgical History:  Procedure Laterality Date   [place]   for ruptured disc   CHOLECYSTECTOMY     COLONOSCOPY     greenfield filter     ileojejunal bypass  1976   for obesity   LAPAROTOMY N/A 09/18/2014  Procedure: EXPLORATORY LAPAROTOMY WITH REPAIR OF CECIAL PERFORATION;  Surgeon: Autumn Messing III, MD;  Location: [facility];  Service: General;  Laterality: N/A;   TONSILLECTOMY     TOTAL KNEE ARTHROPLASTY Right 08/26/2019   Procedure: RIGHT TOTAL KNEE ARTHROPLASTY, LEFT KNEE CORTISONE INJECTION;  Surgeon: Paralee Cancel, MD;  Location: [facility];  Service: Orthopedics;  Laterality: Right;  70 MINS   TOTAL KNEE ARTHROPLASTY Left 01/25/2021   Procedure: TOTAL KNEE ARTHROPLASTY;  Surgeon: Paralee Cancel, MD;  Location: [facility];  Service: Orthopedics;  Laterality: Left;   UPPER GI ENDOSCOPY     VASCULAR SURGERY Right    revascularization right lower extremity    Family History  Problem Relation Age of Onset   Cervical cancer Mother    Stroke Mother    Liver disease Sister    Kidney disease Sister    Cirrhosis Sister    Colon cancer Neg Hx    Esophageal cancer Neg Hx    Rectal cancer Neg Hx    Stomach cancer Neg Hx     Allergies  Allergen Reactions   Ativan [Lorazepam] Anaphylaxis and Other (See Comments)    Deathly allergic; "resp rate dropped dropped to 6"   Other Anaphylaxis    Red peppers--choking, also   Azithromycin Diarrhea   Benadryl [Diphenhydramine Hcl] Other (See Comments)    Paradoxical response   Ciprofloxacin Hcl Other (See Comments)    Causes yeast infection and refuses to take   Codeine Phosphate Nausea Only    Can tolerate in limited amounts   Hydrocodone Itching  and Nausea And Vomiting   Hydrocodone Bit-Homatrop Mbr Itching and Nausea And Vomiting   Levaquin [Levofloxacin In D5w] Other (See Comments)    Muscle soreness   Meperidine Hcl Other (See Comments)    Reaction ??   Molds & Smuts Other (See Comments)    unsure   Sulfa Antibiotics Other (See Comments)    Joint pain   Tape Other (See Comments)    No Coban wrap (per the patient)   Ultram [Tramadol Hcl] Other (See Comments)    "Can't move joints"    Current Outpatient Medications on File Prior to Visit  Medication Sig Dispense Refill   acetaminophen (TYLENOL) 500 MG tablet Take 2 tablets (1,000 mg total) by mouth every 8 (eight) hours. (Patient taking differently: Take 1,000 mg by mouth every 8 (eight) hours as needed for mild pain.) 30 tablet 0   albuterol (PROVENTIL HFA;VENTOLIN HFA) 108 (90 BASE) MCG/ACT inhaler Inhale 1-2 puffs into the lungs every 6 (six) hours as needed for wheezing or shortness of breath.     ALPRAZolam (XANAX) 0.5 MG tablet Take 1 tablet by mouth at bedtime as needed for anxiety. 30 tablet 3   amitriptyline (ELAVIL) 100 MG tablet Take 1 tablet (100 mg total) by mouth daily. 90 tablet 1   apixaban (ELIQUIS) 2.5 MG TABS tablet Take 1 tablet (2.5 mg total) by mouth every 12 (twelve) hours for 28 days. Then may resume normal dose of aspirin 56 tablet 0   azelastine (OPTIVAR) 0.05 % ophthalmic solution Place 1 drop into both eyes daily as needed (for dry eyes).     benzonatate (TESSALON) 100 MG capsule TAKE 1 CAPSULE BY MOUTH 3 TIMES DAILY AS NEEDED FOR COUGH (Patient taking differently: Take 100 mg by mouth 3 (three) times daily as needed for cough.) 30 capsule 0   budesonide-formoterol (SYMBICORT) 160-4.5 MCG/ACT inhaler Inhale 2 puffs into the lungs See admin instructions. Inhale 2 puffs  into the lungs one to two times a day 1 Inhaler 2   cetirizine (ZYRTEC) 10 MG tablet Take 10 mg by mouth daily.      COVID-19 mRNA Vac-TriS, Pfizer, SUSP injection Inject into the muscle.  0.3 mL 0   cyanocobalamin (,VITAMIN B-12,) 1000 MCG/ML injection INJECT 1CC INTO THE MUSCLE EVERY MONTH (Patient taking differently: Inject 1,000 mcg into the muscle every 30 (thirty) days.) 10 mL 1   dicyclomine (BENTYL) 10 MG capsule Take 1 capsule (10 mg total) by mouth 3 (three) times daily as needed for diarrhea and symptoms. 270 capsule 0   diphenoxylate-atropine (LOMOTIL) 2.5-0.025 MG tablet Take 1 tablet by mouth 3 times daily as needed for diarrhea or loose stools. 270 tablet 1   diphenoxylate-atropine (LOMOTIL) 2.5-0.025 MG tablet Take 1 tablet by mouth 3 times daily as needed for diarrhea or loose stools. 270 tablet 1   docusate sodium (COLACE) 100 MG capsule Take 1 capsule (100 mg total) by mouth 2 (two) times daily. 10 capsule 0   Fe Fum-FA-B Cmp-C-Zn-Mg-Mn-Cu (HEMOCYTE PLUS) 106-1 MG CAPS TAKE 1 CAPSULE BY MOUTH 2 TIMES DAILY 60 capsule 1   Fe Fum-FA-B Cmp-C-Zn-Mg-Mn-Cu (HEMOCYTE PLUS) 106-1 MG CAPS TAKE 1 CAPSULE BY MOUTH TWO TIMES DAILY 60 capsule 1   fluconazole (DIFLUCAN) 150 MG tablet Take 1 tablet (150 mg total) by mouth once a week. 2 tablet 1   fluticasone (FLONASE) 50 MCG/ACT nasal spray Place 2 sprays into both nostrils daily.     gabapentin (NEURONTIN) 300 MG capsule TAKE 1 CAPSULE BY MOUTH AT BEDTIME (Patient taking differently: Take 300 mg by mouth at bedtime.) 90 capsule 1   hydrOXYzine (ATARAX) 25 MG tablet Take 25 mg by mouth 3 (three) times daily as needed for itching.     levocetirizine (XYZAL) 5 MG tablet Take 1 tablet by mouth in the evening 90 tablet 0   magic mouthwash (nystatin, hydrocortisone, diphenhydrAMINE) suspension Swish, gargle and spit 5 to 10 mLs 4 times daily as needed. 160 mL 1   magnesium oxide (MAG-OX) 400 MG tablet Take 400 mg by mouth 2 (two) times daily.     methocarbamol (ROBAXIN) 500 MG tablet Take 1 tablet (500 mg total) by mouth every 6 (six) hours as needed for muscle spasms. 40 tablet 0   metoprolol succinate (TOPROL XL) 25 MG 24 hr tablet  Take 1 tablet (25 mg total) by mouth daily. 30 tablet 5   montelukast (SINGULAIR) 10 MG tablet Take 10 mg by mouth at bedtime.     montelukast (SINGULAIR) 10 MG tablet Take 1 tablet by mouth in the evening (Patient taking differently: Take 10 mg by mouth at bedtime.) 90 tablet 2   omeprazole (PRILOSEC) 20 MG capsule Take 1 capsule (20 mg total) by mouth daily. 90 capsule 0   ondansetron (ZOFRAN-ODT) 4 MG disintegrating tablet Take 1 tablet (4 mg total) by mouth 3 (three) times daily as needed for nausea or vomiting. 20 tablet 0   oxyCODONE (OXY IR/ROXICODONE) 5 MG immediate release tablet Take 1-2 tablets (5-10 mg total) by mouth every 6 hours as needed for severe pain (pain score 4-6). 42 tablet 0   phenazopyridine (PYRIDIUM) 100 MG tablet Take 1 tablet (100 mg total) by mouth 3 (three) times daily as needed for pain. 20 tablet 0   polyethylene glycol powder (GLYCOLAX/MIRALAX) 17 GM/SCOOP powder Mix 17 g in 4 oz of water or juice and drink by mouth daily as needed for mild constipation. 238 g 0  potassium chloride SA (KLOR-CON) 20 MEQ tablet Take 1 tablet (20 mEq total) by mouth daily. 90 tablet 1   Probiotic Product (PROBIOTIC DAILY PO) Take 1 capsule by mouth daily.     Vitamin D, Ergocalciferol, (DRISDOL) 1.25 MG (50000 UNIT) CAPS capsule TAKE 1 CAPSULE BY MOUTH EVERY 7 DAYS 12 capsule 0   zolpidem (AMBIEN) 5 MG tablet TAKE 1 TABLET BY MOUTH AT BEDTIME (Patient taking differently: Take 5 mg by mouth at bedtime.) 90 tablet 1   valACYclovir (VALTREX) 500 MG tablet TAKE 1 TABLET BY MOUTH ONCE A DAY AS NEEDED. (Patient taking differently: Take 500 mg by mouth daily as needed (cold sore).) 30 tablet 5   No current facility-administered medications on file prior to visit.    BP (!) 125/59   Pulse (!) 58   Temp 98.2 F (36.8 C)   Resp 18   Ht 5\' 4"  (1.626 m)   Wt 166 lb (75.3 kg)   SpO2 96%   BMI 28.49 kg/m       Objective:   Physical Exam  General Mental Status- Alert. General  Appearance- Not in acute distress.   Skin General: Color- Normal Color. Moisture- Normal Moisture.  Neck Carotid Arteries- Normal color. Moisture- Normal Moisture. No carotid bruits. No JVD.  Chest and Lung Exam Auscultation: Breath Sounds:-Normal.  Cardiovascular Auscultation:Rythm- Regular. Murmurs & Other Heart Sounds:Auscultation of the heart reveals- No Murmurs.  Abdomen Inspection:-Inspeection Normal. Palpation/Percussion:Note:No mass. Palpation and Percussion of the abdomen reveal- Non Tender, Non Distended + BS, no rebound or guarding.  Neurologic Cranial Nerve exam:- CN III-XII intact(No nystagmus), symmetric smile. Strength:- 5/5 equal and symmetric strength both upper and lower extremities.   Left knee- good rom. Edges of banage not red, warm or tender.       Assessment & Plan:   Patient Instructions  Recent anemia while hospitalized post surgery. You describe some persisting fatigue. Will get cbc, iron, tibc, ferritin and cmp. Also ordered ifob to test stool for blood.   Gradually improving knee pain. Continue with therapy. If you have any redness, swelling or indication of infection let us know. Ask you daughter RN to update [place] if infection like changes.  Slight wbc elevation on review of labs done in hospital. Will follow wbc count and update you.  Follow up date to be determined after lab review.   Mackie Pai, PA-C

## 2021-02-22 NOTE — Patient Instructions (Addendum)
Recent anemia while hospitalized post surgery. You describe some persisting fatigue. Will get cbc, iron, tibc, ferritin and cmp. Also ordered ifob to test stool for blood.   Gradually improving knee pain. Continue with therapy. If you have any redness, swelling or indication of infection let us know. Ask you daughter RN to update [place] if infection like changes.  Slight wbc elevation on review of labs done in hospital. Will follow wbc count and update you.  Follow up date to be determined after lab review.

## 2021-02-22 NOTE — Addendum Note (Signed)
Addended by: Manuela Schwartz on: 02/22/2021 04:00 PM   Modules accepted: Orders

## 2021-02-23 LAB — CBC WITH DIFFERENTIAL/PLATELET
Basophils Absolute: 0.1 10*3/uL (ref 0.0–0.1)
Basophils Relative: 1.4 % (ref 0.0–3.0)
Eosinophils Absolute: 0.2 10*3/uL (ref 0.0–0.7)
Eosinophils Relative: 3.1 % (ref 0.0–5.0)
HCT: 32.3 % — ABNORMAL LOW (ref 36.0–46.0)
Hemoglobin: 10.6 g/dL — ABNORMAL LOW (ref 12.0–15.0)
Lymphocytes Relative: 31 % (ref 12.0–46.0)
Lymphs Abs: 1.7 10*3/uL (ref 0.7–4.0)
MCHC: 32.9 g/dL (ref 30.0–36.0)
MCV: 90.1 fl (ref 78.0–100.0)
Monocytes Absolute: 0.6 10*3/uL (ref 0.1–1.0)
Monocytes Relative: 10.3 % (ref 3.0–12.0)
Neutro Abs: 3 10*3/uL (ref 1.4–7.7)
Neutrophils Relative %: 54.2 % (ref 43.0–77.0)
Platelets: 274 10*3/uL (ref 150.0–400.0)
RBC: 3.58 Mil/uL — ABNORMAL LOW (ref 3.87–5.11)
RDW: 16.1 % — ABNORMAL HIGH (ref 11.5–15.5)
WBC: 5.4 10*3/uL (ref 4.0–10.5)

## 2021-02-23 LAB — IRON: Iron: 38 ug/dL — ABNORMAL LOW (ref 42–145)

## 2021-02-23 LAB — COMPREHENSIVE METABOLIC PANEL
ALT: 12 U/L (ref 0–35)
AST: 17 U/L (ref 0–37)
Albumin: 3.9 g/dL (ref 3.5–5.2)
Alkaline Phosphatase: 64 U/L (ref 39–117)
BUN: 11 mg/dL (ref 6–23)
CO2: 25 mEq/L (ref 19–32)
Calcium: 8.8 mg/dL (ref 8.4–10.5)
Chloride: 101 mEq/L (ref 96–112)
Creatinine, Ser: 0.59 mg/dL (ref 0.40–1.20)
GFR: 81.95 mL/min (ref >60.00)
Glucose, Bld: 89 mg/dL (ref 70–99)
Potassium: 3.9 mEq/L (ref 3.5–5.1)
Sodium: 135 mEq/L (ref 135–145)
Total Bilirubin: 0.4 mg/dL (ref 0.2–1.2)
Total Protein: 6.6 g/dL (ref 6.0–8.3)

## 2021-02-23 LAB — FERRITIN: Ferritin: 615.5 ng/mL — ABNORMAL HIGH (ref 10.0–291.0)

## 2021-02-24 LAB — IRON AND TIBC
Iron Saturation: 16 % (ref 15–55)
Iron: 36 ug/dL (ref 27–139)
Total Iron Binding Capacity: 225 ug/dL — ABNORMAL LOW (ref 250–450)
UIBC: 189 ug/dL (ref 118–369)

## 2021-02-25 DIAGNOSIS — G4700 Insomnia, unspecified: Secondary | ICD-10-CM | POA: Diagnosis not present

## 2021-02-25 DIAGNOSIS — Z87440 Personal history of urinary (tract) infections: Secondary | ICD-10-CM | POA: Diagnosis not present

## 2021-02-25 DIAGNOSIS — Z96652 Presence of left artificial knee joint: Secondary | ICD-10-CM | POA: Diagnosis not present

## 2021-02-25 DIAGNOSIS — G43909 Migraine, unspecified, not intractable, without status migrainosus: Secondary | ICD-10-CM | POA: Diagnosis not present

## 2021-02-25 DIAGNOSIS — Z86010 Personal history of colonic polyps: Secondary | ICD-10-CM | POA: Diagnosis not present

## 2021-02-25 DIAGNOSIS — J4530 Mild persistent asthma, uncomplicated: Secondary | ICD-10-CM | POA: Diagnosis not present

## 2021-02-25 DIAGNOSIS — D649 Anemia, unspecified: Secondary | ICD-10-CM | POA: Diagnosis not present

## 2021-02-25 DIAGNOSIS — M5137 Other intervertebral disc degeneration, lumbosacral region: Secondary | ICD-10-CM | POA: Diagnosis not present

## 2021-02-25 DIAGNOSIS — I4891 Unspecified atrial fibrillation: Secondary | ICD-10-CM | POA: Diagnosis not present

## 2021-02-25 DIAGNOSIS — Z9181 History of falling: Secondary | ICD-10-CM | POA: Diagnosis not present

## 2021-02-25 DIAGNOSIS — Z96651 Presence of right artificial knee joint: Secondary | ICD-10-CM | POA: Diagnosis not present

## 2021-02-25 DIAGNOSIS — F419 Anxiety disorder, unspecified: Secondary | ICD-10-CM | POA: Diagnosis not present

## 2021-02-25 DIAGNOSIS — Z7901 Long term (current) use of anticoagulants: Secondary | ICD-10-CM | POA: Diagnosis not present

## 2021-02-25 DIAGNOSIS — G8929 Other chronic pain: Secondary | ICD-10-CM | POA: Diagnosis not present

## 2021-02-25 DIAGNOSIS — E785 Hyperlipidemia, unspecified: Secondary | ICD-10-CM | POA: Diagnosis not present

## 2021-02-25 DIAGNOSIS — Z9884 Bariatric surgery status: Secondary | ICD-10-CM | POA: Diagnosis not present

## 2021-02-25 DIAGNOSIS — Z471 Aftercare following joint replacement surgery: Secondary | ICD-10-CM | POA: Diagnosis not present

## 2021-02-25 DIAGNOSIS — Z8701 Personal history of pneumonia (recurrent): Secondary | ICD-10-CM | POA: Diagnosis not present

## 2021-02-25 DIAGNOSIS — E538 Deficiency of other specified B group vitamins: Secondary | ICD-10-CM | POA: Diagnosis not present

## 2021-02-25 DIAGNOSIS — M1120 Other chondrocalcinosis, unspecified site: Secondary | ICD-10-CM | POA: Diagnosis not present

## 2021-02-25 DIAGNOSIS — K219 Gastro-esophageal reflux disease without esophagitis: Secondary | ICD-10-CM | POA: Diagnosis not present

## 2021-02-25 DIAGNOSIS — Z86718 Personal history of other venous thrombosis and embolism: Secondary | ICD-10-CM | POA: Diagnosis not present

## 2021-02-25 DIAGNOSIS — E669 Obesity, unspecified: Secondary | ICD-10-CM | POA: Diagnosis not present

## 2021-02-25 DIAGNOSIS — I447 Left bundle-branch block, unspecified: Secondary | ICD-10-CM | POA: Diagnosis not present

## 2021-02-28 ENCOUNTER — Other Ambulatory Visit (HOSPITAL_COMMUNITY): Payer: Self-pay

## 2021-03-01 ENCOUNTER — Other Ambulatory Visit (HOSPITAL_COMMUNITY): Payer: Self-pay

## 2021-03-01 DIAGNOSIS — F419 Anxiety disorder, unspecified: Secondary | ICD-10-CM | POA: Diagnosis not present

## 2021-03-01 DIAGNOSIS — I447 Left bundle-branch block, unspecified: Secondary | ICD-10-CM | POA: Diagnosis not present

## 2021-03-01 DIAGNOSIS — G8929 Other chronic pain: Secondary | ICD-10-CM | POA: Diagnosis not present

## 2021-03-01 DIAGNOSIS — E669 Obesity, unspecified: Secondary | ICD-10-CM | POA: Diagnosis not present

## 2021-03-01 DIAGNOSIS — M1120 Other chondrocalcinosis, unspecified site: Secondary | ICD-10-CM | POA: Diagnosis not present

## 2021-03-01 DIAGNOSIS — J4530 Mild persistent asthma, uncomplicated: Secondary | ICD-10-CM | POA: Diagnosis not present

## 2021-03-01 DIAGNOSIS — Z87440 Personal history of urinary (tract) infections: Secondary | ICD-10-CM | POA: Diagnosis not present

## 2021-03-01 DIAGNOSIS — E538 Deficiency of other specified B group vitamins: Secondary | ICD-10-CM | POA: Diagnosis not present

## 2021-03-01 DIAGNOSIS — K219 Gastro-esophageal reflux disease without esophagitis: Secondary | ICD-10-CM | POA: Diagnosis not present

## 2021-03-01 DIAGNOSIS — Z9884 Bariatric surgery status: Secondary | ICD-10-CM | POA: Diagnosis not present

## 2021-03-01 DIAGNOSIS — M5137 Other intervertebral disc degeneration, lumbosacral region: Secondary | ICD-10-CM | POA: Diagnosis not present

## 2021-03-01 DIAGNOSIS — Z7901 Long term (current) use of anticoagulants: Secondary | ICD-10-CM | POA: Diagnosis not present

## 2021-03-01 DIAGNOSIS — Z96652 Presence of left artificial knee joint: Secondary | ICD-10-CM | POA: Diagnosis not present

## 2021-03-01 DIAGNOSIS — Z9181 History of falling: Secondary | ICD-10-CM | POA: Diagnosis not present

## 2021-03-01 DIAGNOSIS — Z96651 Presence of right artificial knee joint: Secondary | ICD-10-CM | POA: Diagnosis not present

## 2021-03-01 DIAGNOSIS — Z86010 Personal history of colonic polyps: Secondary | ICD-10-CM | POA: Diagnosis not present

## 2021-03-01 DIAGNOSIS — Z8701 Personal history of pneumonia (recurrent): Secondary | ICD-10-CM | POA: Diagnosis not present

## 2021-03-01 DIAGNOSIS — Z471 Aftercare following joint replacement surgery: Secondary | ICD-10-CM | POA: Diagnosis not present

## 2021-03-01 DIAGNOSIS — E785 Hyperlipidemia, unspecified: Secondary | ICD-10-CM | POA: Diagnosis not present

## 2021-03-01 DIAGNOSIS — I4891 Unspecified atrial fibrillation: Secondary | ICD-10-CM | POA: Diagnosis not present

## 2021-03-01 DIAGNOSIS — D649 Anemia, unspecified: Secondary | ICD-10-CM | POA: Diagnosis not present

## 2021-03-01 DIAGNOSIS — Z86718 Personal history of other venous thrombosis and embolism: Secondary | ICD-10-CM | POA: Diagnosis not present

## 2021-03-01 DIAGNOSIS — G4700 Insomnia, unspecified: Secondary | ICD-10-CM | POA: Diagnosis not present

## 2021-03-01 DIAGNOSIS — G43909 Migraine, unspecified, not intractable, without status migrainosus: Secondary | ICD-10-CM | POA: Diagnosis not present

## 2021-03-02 ENCOUNTER — Other Ambulatory Visit: Payer: Self-pay | Admitting: Student

## 2021-03-02 ENCOUNTER — Other Ambulatory Visit (HOSPITAL_COMMUNITY): Payer: Self-pay

## 2021-03-02 MED ORDER — ELIQUIS 2.5 MG PO TABS
2.5000 mg | ORAL_TABLET | Freq: Two times a day (BID) | ORAL | 0 refills | Status: DC
  Filled 2021-03-02: qty 60, 30d supply, fill #0

## 2021-03-03 ENCOUNTER — Other Ambulatory Visit (HOSPITAL_COMMUNITY): Payer: Self-pay

## 2021-03-03 ENCOUNTER — Telehealth: Payer: Self-pay | Admitting: Family Medicine

## 2021-03-03 MED ORDER — OXYCODONE HCL 5 MG PO TABS
ORAL_TABLET | ORAL | 0 refills | Status: DC
  Filled 2021-03-03: qty 30, 5d supply, fill #0

## 2021-03-03 NOTE — Telephone Encounter (Signed)
[name] was calling just to make Dr. Charlett Blake aware that patient had knee surgery a month ago (Nov 1st) and has not gotten appetite back since she usually loses it while at the hospital. Her appetite loss is fair, but the patient is not concerned about it

## 2021-03-04 NOTE — Telephone Encounter (Signed)
Lvm with information 

## 2021-03-10 ENCOUNTER — Other Ambulatory Visit: Payer: Self-pay | Admitting: Family Medicine

## 2021-03-10 ENCOUNTER — Other Ambulatory Visit (HOSPITAL_COMMUNITY): Payer: Self-pay

## 2021-03-10 DIAGNOSIS — Z4789 Encounter for other orthopedic aftercare: Secondary | ICD-10-CM | POA: Diagnosis not present

## 2021-03-10 DIAGNOSIS — Z96652 Presence of left artificial knee joint: Secondary | ICD-10-CM | POA: Diagnosis not present

## 2021-03-10 DIAGNOSIS — Z09 Encounter for follow-up examination after completed treatment for conditions other than malignant neoplasm: Secondary | ICD-10-CM | POA: Diagnosis not present

## 2021-03-10 DIAGNOSIS — Z471 Aftercare following joint replacement surgery: Secondary | ICD-10-CM | POA: Diagnosis not present

## 2021-03-11 ENCOUNTER — Other Ambulatory Visit (HOSPITAL_COMMUNITY): Payer: Self-pay

## 2021-03-11 MED ORDER — GABAPENTIN 300 MG PO CAPS
ORAL_CAPSULE | Freq: Every day | ORAL | 1 refills | Status: DC
  Filled 2021-03-11: qty 90, 90d supply, fill #0
  Filled 2021-06-10: qty 90, 90d supply, fill #1

## 2021-03-15 DIAGNOSIS — J301 Allergic rhinitis due to pollen: Secondary | ICD-10-CM | POA: Diagnosis not present

## 2021-03-15 DIAGNOSIS — J4530 Mild persistent asthma, uncomplicated: Secondary | ICD-10-CM | POA: Diagnosis not present

## 2021-03-15 DIAGNOSIS — J3081 Allergic rhinitis due to animal (cat) (dog) hair and dander: Secondary | ICD-10-CM | POA: Diagnosis not present

## 2021-03-15 DIAGNOSIS — J3089 Other allergic rhinitis: Secondary | ICD-10-CM | POA: Diagnosis not present

## 2021-03-19 ENCOUNTER — Other Ambulatory Visit: Payer: Self-pay | Admitting: Student

## 2021-03-21 ENCOUNTER — Other Ambulatory Visit: Payer: Self-pay | Admitting: Internal Medicine

## 2021-03-22 ENCOUNTER — Other Ambulatory Visit (HOSPITAL_COMMUNITY): Payer: Self-pay

## 2021-03-22 MED ORDER — DIPHENOXYLATE-ATROPINE 2.5-0.025 MG PO TABS
ORAL_TABLET | ORAL | 1 refills | Status: DC
Start: 2021-03-22 — End: 2021-03-29
  Filled 2021-03-22: qty 270, 90d supply, fill #0
  Filled 2021-03-23: qty 70, 23d supply, fill #0

## 2021-03-22 MED ORDER — DIPHENOXYLATE-ATROPINE 2.5-0.025 MG PO TABS: 1.0000 | ORAL_TABLET | Freq: Three times a day (TID) | ORAL | 1 refills | Status: DC | PRN

## 2021-03-23 ENCOUNTER — Other Ambulatory Visit (HOSPITAL_COMMUNITY): Payer: Self-pay

## 2021-03-23 ENCOUNTER — Telehealth: Payer: Self-pay | Admitting: Internal Medicine

## 2021-03-23 NOTE — Telephone Encounter (Signed)
Inbound call from patient states she is experiencing extreme diarrhea. States Lomotil prescription instructions need to be changed because she takes 6 a day some days and the prescription is not enough. Sent to [facility]

## 2021-03-23 NOTE — Telephone Encounter (Signed)
I spoke with Alexis Olson and she said she is under a lot of stress , her daughter was in a car accident 3 weeks ago and is still in the hospital. She thinks she may have a little virus and together with all the [place] she is having more diarrhea than usual. She is taking 2 lomotil at a time and some days 6 of them total. I told her we have okayed her rx but will have to ask Dr Carlean Purl about changing the sig when he returns next week. She is fine with that. She is trying to stay hydrated and using the BRAT diet. She said if it continues she will call us back and make an appointment.

## 2021-03-24 ENCOUNTER — Other Ambulatory Visit (HOSPITAL_COMMUNITY): Payer: Self-pay

## 2021-03-24 MED ORDER — METHOCARBAMOL 500 MG PO TABS
500.0000 mg | ORAL_TABLET | Freq: Four times a day (QID) | ORAL | 0 refills | Status: DC | PRN
Start: 2021-03-24 — End: 2022-04-19
  Filled 2021-03-24: qty 40, 10d supply, fill #0

## 2021-03-25 ENCOUNTER — Encounter: Payer: PPO | Admitting: Medical

## 2021-03-25 NOTE — Telephone Encounter (Signed)
error 

## 2021-03-26 ENCOUNTER — Other Ambulatory Visit: Payer: Self-pay | Admitting: Family Medicine

## 2021-03-27 ENCOUNTER — Other Ambulatory Visit (HOSPITAL_COMMUNITY): Payer: Self-pay

## 2021-03-28 ENCOUNTER — Other Ambulatory Visit: Payer: Self-pay | Admitting: Internal Medicine

## 2021-03-28 ENCOUNTER — Other Ambulatory Visit (HOSPITAL_COMMUNITY): Payer: Self-pay

## 2021-03-28 NOTE — Telephone Encounter (Signed)
Please f/u w/ her re: what does she need in terms of refill  How many Lomotil are left and current usage?

## 2021-03-28 NOTE — Telephone Encounter (Signed)
Patient is requesting a refill of the following medications: Requested Prescriptions   Pending Prescriptions Disp Refills   ALPRAZolam (XANAX) 0.5 MG tablet 30 tablet 3    Sig: Take 1 tablet by mouth at bedtime as needed for anxiety.    Date of patient request: 03/28/21 Last office visit: 02/22/21 Date of last refill: 11/11/20 Last refill amount: 30+0  Follow up time period per chart: none yet

## 2021-03-29 ENCOUNTER — Other Ambulatory Visit (HOSPITAL_COMMUNITY): Payer: Self-pay

## 2021-03-29 MED ORDER — DICYCLOMINE HCL 10 MG PO CAPS
10.0000 mg | ORAL_CAPSULE | Freq: Three times a day (TID) | ORAL | 0 refills | Status: DC
  Filled 2021-03-29: qty 270, 90d supply, fill #0

## 2021-03-29 MED ORDER — ALPRAZOLAM 0.5 MG PO TABS
0.5000 mg | ORAL_TABLET | Freq: Every evening | ORAL | 3 refills | Status: DC | PRN
  Filled 2021-03-29: qty 30, 30d supply, fill #0
  Filled 2021-04-23 – 2021-04-25 (×2): qty 30, 30d supply, fill #1
  Filled 2021-05-24: qty 30, 30d supply, fill #2
  Filled 2021-06-22: qty 30, 30d supply, fill #3

## 2021-03-29 MED ORDER — DICYCLOMINE HCL 10 MG PO CAPS
10.0000 mg | ORAL_CAPSULE | Freq: Three times a day (TID) | ORAL | 3 refills | Status: DC | PRN
  Filled 2021-03-29: qty 180, 30d supply, fill #0
  Filled 2021-05-16: qty 148, 25d supply, fill #1
  Filled 2021-05-16: qty 180, 30d supply, fill #1

## 2021-03-29 MED ORDER — DIPHENOXYLATE-ATROPINE 2.5-0.025 MG PO TABS
ORAL_TABLET | ORAL | 3 refills | Status: DC
  Filled 2021-03-29: qty 180, 10d supply, fill #0
  Filled 2021-04-01: qty 156, 19d supply, fill #0
  Filled 2021-04-01: qty 180, 22d supply, fill #0
  Filled 2021-04-01: qty 24, 3d supply, fill #0
  Filled 2021-05-18: qty 180, 22d supply, fill #1
  Filled 2021-07-17: qty 180, 22d supply, fill #2
  Filled 2021-11-14: qty 180, 22d supply, fill #3

## 2021-03-29 NOTE — Telephone Encounter (Signed)
Inbound call from patient to follow up on medication refill. States she needs both Lomotil and Bentyl

## 2021-03-29 NOTE — Telephone Encounter (Signed)
She has #15 Lomotil left. She is taking on average 2 tablets TID. She has only taken 2 lomotil so far today.

## 2021-03-29 NOTE — Telephone Encounter (Signed)
Rx refilled w/ increased #'s

## 2021-03-29 NOTE — Telephone Encounter (Signed)
Dr Carlean Purl spoke to them thru secure chat.

## 2021-03-29 NOTE — Telephone Encounter (Signed)
[place] called with questions about prescription

## 2021-03-31 DIAGNOSIS — Z96652 Presence of left artificial knee joint: Secondary | ICD-10-CM | POA: Diagnosis not present

## 2021-03-31 DIAGNOSIS — R262 Difficulty in walking, not elsewhere classified: Secondary | ICD-10-CM | POA: Insufficient documentation

## 2021-04-01 ENCOUNTER — Other Ambulatory Visit (HOSPITAL_COMMUNITY): Payer: Self-pay

## 2021-04-02 ENCOUNTER — Other Ambulatory Visit (HOSPITAL_COMMUNITY): Payer: Self-pay

## 2021-04-04 ENCOUNTER — Other Ambulatory Visit: Payer: Self-pay | Admitting: Internal Medicine

## 2021-04-04 ENCOUNTER — Telehealth: Payer: Self-pay | Admitting: Family Medicine

## 2021-04-04 ENCOUNTER — Other Ambulatory Visit (HOSPITAL_COMMUNITY): Payer: Self-pay

## 2021-04-04 ENCOUNTER — Other Ambulatory Visit: Payer: Self-pay | Admitting: Family Medicine

## 2021-04-04 MED ORDER — OMEPRAZOLE 20 MG PO CPDR
20.0000 mg | DELAYED_RELEASE_CAPSULE | Freq: Every day | ORAL | 0 refills | Status: DC
  Filled 2021-04-04: qty 90, 90d supply, fill #0

## 2021-04-04 MED ORDER — FLUCONAZOLE 150 MG PO TABS
150.0000 mg | ORAL_TABLET | ORAL | 1 refills | Status: DC
  Filled 2021-04-04: qty 2, 14d supply, fill #0

## 2021-04-04 MED ORDER — ELIQUIS 2.5 MG PO TABS
2.5000 mg | ORAL_TABLET | Freq: Two times a day (BID) | ORAL | 1 refills | Status: DC
  Filled 2021-04-04: qty 60, 30d supply, fill #0
  Filled 2021-05-04: qty 60, 30d supply, fill #1

## 2021-04-04 NOTE — Telephone Encounter (Signed)
Pt stated she has a yeast infection. Advised pt to sched an appt. 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 Please advise.

## 2021-04-06 DIAGNOSIS — M25562 Pain in left knee: Secondary | ICD-10-CM | POA: Diagnosis not present

## 2021-04-07 ENCOUNTER — Other Ambulatory Visit (HOSPITAL_COMMUNITY): Payer: Self-pay

## 2021-04-07 ENCOUNTER — Other Ambulatory Visit: Payer: Self-pay | Admitting: Family Medicine

## 2021-04-08 ENCOUNTER — Other Ambulatory Visit (HOSPITAL_COMMUNITY): Payer: Self-pay

## 2021-04-08 MED ORDER — AMITRIPTYLINE HCL 100 MG PO TABS
100.0000 mg | ORAL_TABLET | Freq: Every day | ORAL | 1 refills | Status: DC
  Filled 2021-04-08: qty 90, 90d supply, fill #0
  Filled 2021-07-07: qty 90, 90d supply, fill #1

## 2021-04-12 DIAGNOSIS — M25562 Pain in left knee: Secondary | ICD-10-CM | POA: Diagnosis not present

## 2021-04-14 DIAGNOSIS — M25562 Pain in left knee: Secondary | ICD-10-CM | POA: Diagnosis not present

## 2021-04-15 ENCOUNTER — Telehealth: Payer: Self-pay | Admitting: Family Medicine

## 2021-04-15 NOTE — Telephone Encounter (Signed)
Left message for patient to call back and schedule Medicare Annual Wellness Visit (AWV) in office.  ° °If not able to come in office, please offer to do virtually or by telephone.  Left office number and my jabber #[phone]. ° °Last AWV:09/13/2018 ° °Please schedule at anytime with Nurse Health Advisor. °  °

## 2021-04-16 ENCOUNTER — Other Ambulatory Visit: Payer: Self-pay | Admitting: Family Medicine

## 2021-04-18 ENCOUNTER — Other Ambulatory Visit: Payer: Self-pay

## 2021-04-18 ENCOUNTER — Ambulatory Visit (HOSPITAL_BASED_OUTPATIENT_CLINIC_OR_DEPARTMENT_OTHER): Payer: PPO | Admitting: Family

## 2021-04-18 ENCOUNTER — Other Ambulatory Visit (HOSPITAL_COMMUNITY): Payer: Self-pay

## 2021-04-18 DIAGNOSIS — M25562 Pain in left knee: Secondary | ICD-10-CM | POA: Insufficient documentation

## 2021-04-18 MED ORDER — POTASSIUM CHLORIDE CRYS ER 20 MEQ PO TBCR
20.0000 meq | EXTENDED_RELEASE_TABLET | Freq: Every day | ORAL | 1 refills | Status: DC
  Filled 2021-04-18: qty 90, 90d supply, fill #0
  Filled 2021-07-16: qty 90, 90d supply, fill #1

## 2021-04-19 ENCOUNTER — Other Ambulatory Visit (HOSPITAL_COMMUNITY): Payer: Self-pay

## 2021-04-19 MED ORDER — MONTELUKAST SODIUM 10 MG PO TABS
ORAL_TABLET | ORAL | 2 refills | Status: DC
  Filled 2021-04-19: qty 90, 90d supply, fill #0
  Filled 2021-07-16: qty 90, 90d supply, fill #1
  Filled 2021-10-19: qty 90, 90d supply, fill #2

## 2021-04-21 ENCOUNTER — Other Ambulatory Visit (HOSPITAL_COMMUNITY): Payer: Self-pay

## 2021-04-21 DIAGNOSIS — M25562 Pain in left knee: Secondary | ICD-10-CM | POA: Diagnosis not present

## 2021-04-23 ENCOUNTER — Other Ambulatory Visit (HOSPITAL_COMMUNITY): Payer: Self-pay

## 2021-04-23 ENCOUNTER — Other Ambulatory Visit: Payer: Self-pay | Admitting: Family Medicine

## 2021-04-25 ENCOUNTER — Other Ambulatory Visit: Payer: Self-pay | Admitting: Family Medicine

## 2021-04-25 ENCOUNTER — Other Ambulatory Visit (HOSPITAL_COMMUNITY): Payer: Self-pay

## 2021-04-25 DIAGNOSIS — M25562 Pain in left knee: Secondary | ICD-10-CM | POA: Diagnosis not present

## 2021-04-25 MED ORDER — ZOLPIDEM TARTRATE 5 MG PO TABS
ORAL_TABLET | Freq: Every day | ORAL | 1 refills | Status: DC
  Filled 2021-04-25: qty 90, 90d supply, fill #0
  Filled 2021-06-24 (×2): qty 90, 90d supply, fill #1

## 2021-04-25 MED ORDER — VITAMIN D (ERGOCALCIFEROL) 1.25 MG (50000 UNIT) PO CAPS
ORAL_CAPSULE | ORAL | 0 refills | Status: DC
  Filled 2021-04-25: qty 12, 84d supply, fill #0

## 2021-04-25 NOTE — Telephone Encounter (Signed)
Requesting: Ambien 5mg   Contract: 08/30/2020 UDS: 11/11/2020 Last Visit: 02/22/2021 w/ Percell Miller Next Visit: None Last Refill: 10/20/2020 #90 and 0RF  Please Advise

## 2021-04-26 ENCOUNTER — Other Ambulatory Visit (HOSPITAL_COMMUNITY): Payer: Self-pay

## 2021-04-27 ENCOUNTER — Other Ambulatory Visit (HOSPITAL_COMMUNITY): Payer: Self-pay

## 2021-04-28 ENCOUNTER — Other Ambulatory Visit (HOSPITAL_COMMUNITY): Payer: Self-pay

## 2021-05-02 ENCOUNTER — Other Ambulatory Visit (HOSPITAL_COMMUNITY): Payer: Self-pay

## 2021-05-02 MED ORDER — AMOXICILLIN 500 MG PO CAPS
ORAL_CAPSULE | ORAL | 4 refills | Status: DC
  Filled 2021-05-02: qty 4, 1d supply, fill #0

## 2021-05-03 ENCOUNTER — Other Ambulatory Visit (HOSPITAL_COMMUNITY): Payer: Self-pay

## 2021-05-04 ENCOUNTER — Other Ambulatory Visit (HOSPITAL_COMMUNITY): Payer: Self-pay

## 2021-05-10 ENCOUNTER — Other Ambulatory Visit (HOSPITAL_COMMUNITY): Payer: Self-pay

## 2021-05-10 MED ORDER — AMOXICILLIN 500 MG PO CAPS
ORAL_CAPSULE | ORAL | 0 refills | Status: DC
  Filled 2021-05-10: qty 21, 7d supply, fill #0

## 2021-05-11 ENCOUNTER — Ambulatory Visit (HOSPITAL_BASED_OUTPATIENT_CLINIC_OR_DEPARTMENT_OTHER): Payer: Self-pay | Admitting: Family

## 2021-05-11 NOTE — Progress Notes (Unsigned)
Cardiology Clinic Note   Patient Name: Alexis Olson Date of Encounter: 05/11/2021  Primary Care Provider:  Mosie Lukes, MD Primary Cardiologist:  Dorris Carnes, MD Electrophysiologist:  None   Patient Profile    Alexis Olson is a 88 y.o. female with a hx of paroxysmal atrial fibrillation, grade 1 diastolic dysfunction by echo 10/2019, mild aortic stenosis, mild to moderate tricuspid regurgitation, frequent falls, COVID-19 04/2020 not requiring hospitalization, hypertension, DVT, GERD, anxiety, rheumatic fever, HLD, SAH, arthritis, gastric bypass surgery, anemia. Presents today for follow up of PAF.  Past Medical History    Past Medical History:  Diagnosis Date   ALLERGIC RHINITIS    ANEMIA, CHRONIC    Malabsorption related to bypass hx, B12 and iron deficiency   ANXIETY    Arthritis    ASTHMA    Bariatric surgery status    Bowel perforation ([place]) 2016   C. difficile diarrhea    Colon perforation ([place]) 08/2014   following polypetomy during colo, surgical repair   COLONIC POLYPS, ADENOMATOUS, HX OF 2010   DIVERTICULOSIS, COLON    DVT (deep venous thrombosis) ([place]) 2018   DVT (deep venous thrombosis) ([facility])    GERD (gastroesophageal reflux disease) 08/23/2015   Graded compression stocking in place    History of blood transfusion    History of migraine    History of rheumatic fever    HSV (herpes simplex virus) infection 08/16/2015   Hyperlipidemia    Hypertension    Denies   Hyponatremia 05/02/2016   Incomplete left bundle branch block (LBBB) 2016   Noted on EKG   INSOMNIA    Lumbosacral disc disease    Chronic pain; History of osteomyelitis 2010 following ESI complication   Obesity    Osteomyelitis ([facility])    Pelvic fracture ([facility])    Pneumonia    Recurrent UTI 08/23/2015   URINARY URGENCY    Ventral hernia without obstruction or gangrene 05/22/2018   VITAMIN B12 DEFICIENCY    Past Surgical History:  Procedure Laterality Date   BACK SURGERY  1994   for ruptured  disc   CHOLECYSTECTOMY     COLONOSCOPY     greenfield filter     ileojejunal bypass  1976   for obesity   LAPAROTOMY N/A 09/18/2014   Procedure: EXPLORATORY LAPAROTOMY WITH REPAIR OF CECIAL PERFORATION;  Surgeon: Autumn Messing III, MD;  Location: [facility];  Service: General;  Laterality: N/A;   TONSILLECTOMY     TOTAL KNEE ARTHROPLASTY Right 08/26/2019   Procedure: RIGHT TOTAL KNEE ARTHROPLASTY, LEFT KNEE CORTISONE INJECTION;  Surgeon: Paralee Cancel, MD;  Location: [facility];  Service: Orthopedics;  Laterality: Right;  70 MINS   TOTAL KNEE ARTHROPLASTY Left 01/25/2021   Procedure: TOTAL KNEE ARTHROPLASTY;  Surgeon: Paralee Cancel, MD;  Location: [facility];  Service: Orthopedics;  Laterality: Left;   UPPER GI ENDOSCOPY     VASCULAR SURGERY Right    revascularization right lower extremity    Allergies  Allergies  Allergen Reactions   Ativan [Lorazepam] Anaphylaxis and Other (See Comments)    Deathly allergic; "resp rate dropped dropped to 6"   Other Anaphylaxis    Red peppers--choking, also   Azithromycin Diarrhea   Benadryl [Diphenhydramine Hcl] Other (See Comments)    Paradoxical response   Ciprofloxacin Hcl Other (See Comments)    Causes yeast infection and refuses to take   Codeine Phosphate Nausea Only    Can tolerate in limited amounts   Hydrocodone Itching and Nausea  And Vomiting   Hydrocodone Bit-[place]omatrop Mbr Itching and Nausea And Vomiting   Levaquin [Levofloxacin In D5w] Other (See Comments)    Muscle soreness   Meperidine Hcl Other (See Comments)    Reaction ??   Molds & Smuts Other (See Comments)    unsure   Sulfa Antibiotics Other (See Comments)    Joint pain   Tape Other (See Comments)    No Coban wrap (per the patient)   Ultram [Tramadol Hcl] Other (See Comments)    "Can't move joints"    History of Present Illness   Alexis Olson is a 88 y.o. female with a hx of paroxysmal atrial fibrillation, grade 1 diastolic dysfunction by echo 10/2019, mild aortic stenosis, rheumatic  fever, mild to moderate tricuspid regurgitation, frequent falls, COVID-19 04/2020 not requiring hospitalization, hypertension, DVT, GERD, anxiety, rheumatic fever, HLD, SAH, arthritis, gastric bypass surgery, anemia. 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 Her daughter is now an Therapist, sports in the emergency department.  She underwent right TKA 08/2019. She was hospitalized 10/2019 after accidental fall with CT of head and C-spine without acute findings. Acute metabolic encephalopathy was likely due to polypharmacy. She had LLE acute DVT and was started on Eliquis. Considered provoked DVT in setting of previous surgery. Echo 10/2019 LVEF 60-65%, no RWMA, gr1DD, mild to moderate TR, mild aortic stenosis. Seen in the ED 11/2019 and 12/2019 for cellulitis of the LLE. Venous duplex 04/08/20 with chronic nonocclusive thrombus with septation in right and left common femoral vein and no evidence of acute DVT.  She had COVID19 04/2020 and received monoclonal antibodies. She did not require hospitalization.   Admitted 07/05/20-07/10/20  with initial concern for septic arthritis but final diagnosis inflammatory arthritis. SNF was declined and home health arranged. She was readmitted 07/11/20 - 07/13/20 after fall. Imaging showed small punctuate subarachnoid hemorhage for which neurosurgery did not recommend intervention. Her Eliquis was discontinued. Her telemetry with frequent PAC's, occasional bradycardia, and one 2-3 second pause. She was discharged with 30 day Preventice monitor . Monitor with  predominantly normal sinus rhythm with average heart rate of 90 bpm. She had atrial fibrillation which was new finding with <1% burden and total length 2h20min. She had frequent PAC's and rare PVC's. Dr. Harrington Challenger called to discuss and as she reported no palpitations and no recurrent falls, she was recommended to start Aspirin and referred to home health for vestibular training and objective evaluation of falls risk.   Repeat CT head  09/04/20 showed resolution of subarachnoid hemorrhage. She was seen 09/21/20 doing well and awaiting knee surgery. She was recommended for updated echocardiogram for monitoring of mild aortic stenosis which was performed 11/15/20 showing newly reduced LVEF 45-50%, gr1DD, trivial MR, stable mild AS. She was seen 12/03/20 and Toprol started. She was recommended to resume Eliquis but when patient contacted she was rather confused and Dr. Harrington Challenger recommended continuing Aspirin.   She presents today for follow up. Her husband has been undergoing multiple thoracentesis but shares with me that recent PET scan was reassuring. Does have history of rheumatic fever when she was in the fourth grade. Tells me she has been noting lower extremity edema. Reports no falls since last seen. Tells me her balance is "pretty good". She continues to use a cane as her left knee is "bone on bone" and will require surgical intervention. She reports no lightheadedness nor dizziness. We reviewed her echocardiogram in detail.  ***  EKGs/Labs/Other Studies Reviewed:  The following studies were reviewed today:  Echo 11/15/2020 1. DIfficult acoustic windows make evaluation difficult. LVEF is  depressed with hypokinessis of the inferoseptum and Basal inferior  hypokinesis. Compared to echo from 2021, LVEF appears mildly worse. Left  ventricular ejection fraction, by estimation, is   45 to 50%. The left ventricle has mildly decreased function. Left  ventricular diastolic parameters are consistent with Grade I diastolic  dysfunction (impaired relaxation). Elevated left atrial pressure.   2. Right ventricular systolic function is normal. The right ventricular  size is normal.   3. Trivial mitral valve regurgitation.   4. Tricuspid valve regurgitation is mild to moderate.   5. AV is thickened, calcified with mildly restricted motion Peak and mean  gradients through the valve are 17 and 10 mm Hg respectively consistent  with mild AS .  The aortic valve is tricuspid. Aortic valve regurgitation  is not visualized.   6. The inferior vena cava is normal in size with greater than 50%  respiratory variability, suggesting right atrial pressure of 3 mmHg.   Preventice 30 day monitor 07/17/20 1. NSR with sinus brady and sinus tachycardia 2. Frequent PAC's and rare PVC's 3. Atrial fib with a controlled VR and a RVR (2h 47 minutes total) 4. No prolonged pauses 5. Noise artifact  Echo 11/07/19 1. Left ventricular ejection fraction, by estimation, is 60 to 65%. The  left ventricle has normal function. The left ventricle has no regional  wall motion abnormalities. Left ventricular diastolic parameters are  consistent with Grade I diastolic  dysfunction (impaired relaxation).   2. Right ventricular systolic function is normal. The right ventricular  size is normal. There is normal pulmonary artery systolic pressure.   3. The mitral valve is normal in structure. No evidence of mitral valve  regurgitation. No evidence of mitral stenosis.   4. Tricuspid valve regurgitation is mild to moderate.   5. The aortic valve is normal in structure. Aortic valve regurgitation is  not visualized. Mild aortic valve stenosis.   6. The inferior vena cava is normal in size with greater than 50%  respiratory variability, suggesting right atrial pressure of 3 mmHg.   Recent Labs: 08/30/2020: Magnesium 1.8; TSH 1.82 02/22/2021: ALT 12; BUN 11; Creatinine, Ser 0.59; Hemoglobin 10.6; Platelets 274.0; Potassium 3.9; Sodium 135  Recent Lipid Panel    Component Value Date/Time   CHOL 136 08/30/2020 1147   TRIG 172.0 (H) 08/30/2020 1147   HDL 44.60 08/30/2020 1147   CHOLHDL 3 08/30/2020 1147   VLDL 34.4 08/30/2020 1147   LDLCALC 57 08/30/2020 1147   LDLDIRECT 76.0 10/04/2018 1402    Home Medications      No outpatient medications have been marked as taking for the 05/11/21 encounter (Appointment) with Loel Dubonnet, NP.     Family History     Family History  Problem Relation Age of Onset   Cervical cancer Mother    Stroke Mother    Liver disease Sister    Kidney disease Sister    Cirrhosis Sister    Colon cancer Neg Hx    Esophageal cancer Neg Hx    Rectal cancer Neg Hx    Stomach cancer Neg Hx    She indicated that her mother is deceased. She indicated that her father is deceased. She indicated that the status of her sister is unknown. She indicated that her brother is alive. She indicated that her daughter is alive. She indicated that her son is alive. She indicated that  the status of her neg hx is unknown.   Social History    Social History   Socioeconomic History   Marital status: Married    Spouse name: Not on file   Number of children: 3   Years of education: Not on file   Highest education level: Not on file  Occupational History   Occupation: retired Programmer, multimedia: RETIRED  Tobacco Use   Smoking status: Never   Smokeless tobacco: Never  Vaping Use   Vaping Use: Never used  Substance and Sexual Activity   Alcohol use: No    Alcohol/week: 0.0 standard drinks   Drug use: No   Sexual activity: Never    Partners: Male  Other Topics Concern   Not on file  Social History Narrative   Lives with spouse, Indep ADLs   Supportive family nearby   No dietary restrictions   Social Determinants of Health   Financial Resource Strain: Not on file  Food Insecurity: No Food Insecurity   Worried About Charity fundraiser in the Last Year: Never true   [place] in the Last Year: Never true  Transportation Needs: No Transportation Needs   Lack of Transportation (Medical): No   Lack of Transportation (Non-Medical): No  Physical Activity: Not on file  Stress: Not on file  Social Connections: Not on file  Intimate Partner Violence: Not on file     Review of Systems    All other systems reviewed and are otherwise negative except as detailed above.  Physical Exam    VS:  There were no vitals taken  for this visit. , BMI There is no height or weight on file to calculate BMI. GEN: Well nourished, well developed, in no acute distress. HEENT: Normal. Neck: Supple, no JVD, carotid bruits, or masses. Cardiac: RRR, no murmurs, rubs, or gallops. No clubbing, cyanosis, edema.  Radials/PT 2+ and equal bilaterally.  Respiratory:  Respirations regular and unlabored, clear to auscultation bilaterally.*** GI: Soft, nontender, nondistended, BS + x 4. MS: No deformity or atrophy. Skin: Warm and dry, no rash. Neuro:  Strength and sensation are intact. Psych: Normal affect.  Assessment & Plan   PAF / PAC - PAF noted by 30 day monitor 06/2020 with <1% burden. PAC on monitor with 17% burden. Denies palpitations. CHA2DS2-VASc Score = 5 [CHF History: 0, HTN History: 1, Diabetes History: 0, Stroke History: 0, Vascular Disease History: 1, Age Score: 2, Gender Score: 1].  Therefore, the patient's annual risk of stroke is 7.2 %.   No anticoagulation due to falls risk and fall 06/2020 with subarachnoid hemorrhage which has since resolved. Continue Aspirin 81mg  daily.  Will discuss with primary cardiologist whether to resume anticoagulation.  Likely etiology valvular dysfunction as known mild to moderate TR and mild AS. ***  DVT - Chronic nonocclusive DVT. Anticoagulation on hold, as above. ***  Bradyarrhythmias - noted during admission 06/2020. 30 day monitor 06/2020 with minimum HR 69bpm, no significant pause. Anticipate bradycardia is false reading due to frequent PAC. No indication for PPM.***  Frequent falls - Fall 10/2019 and 06/2020. Fall 09/6158 complicated by subarachnoid hemorrhage at which time Eliquis was discontinued. Her subarachnoid hemorrhage has since resolved by CT 09/03/20.  No falls since that time.***  HFrEF / Mild aortic stenosis / Tricuspid regurgitation - Echo 10/2020 with stable mild aortic stenosis and mild to moderate TR.  EF newly reduced at 45-50%, previously 60 to 65%.  We will add Toprol 25 mg  daily for GDMT.  Future considerations include low-dose losartan.  I anticipate our up titration of GDMT will be limited by relative hypotension.  Low-sodium diet, fluid restriction encouraged.  ***  HTN - BP well controlled. Presently managed with diet and exercise. Add Toprol, as above***  HLD - Management with diet and lifestyle. 08/2020 LDL 57. ***  Disposition: Follow up in *** with Dr. Harrington Challenger or APP.  Loel Dubonnet, NP  05/11/2021, 8:40 AM

## 2021-05-12 DIAGNOSIS — Z96652 Presence of left artificial knee joint: Secondary | ICD-10-CM | POA: Diagnosis not present

## 2021-05-12 DIAGNOSIS — R262 Difficulty in walking, not elsewhere classified: Secondary | ICD-10-CM | POA: Diagnosis not present

## 2021-05-16 ENCOUNTER — Other Ambulatory Visit (HOSPITAL_COMMUNITY): Payer: Self-pay

## 2021-05-16 ENCOUNTER — Other Ambulatory Visit: Payer: Self-pay | Admitting: Internal Medicine

## 2021-05-16 MED ORDER — DICYCLOMINE HCL 20 MG PO TABS
10.0000 mg | ORAL_TABLET | Freq: Three times a day (TID) | ORAL | 3 refills | Status: DC | PRN
Start: 2021-05-16 — End: 2022-05-23
  Filled 2021-05-16 (×2): qty 180, 30d supply, fill #0
  Filled 2021-05-17: qty 90, 30d supply, fill #0
  Filled 2021-07-13: qty 90, 30d supply, fill #1
  Filled 2021-11-08: qty 90, 30d supply, fill #2
  Filled 2022-02-03: qty 90, 30d supply, fill #3

## 2021-05-17 ENCOUNTER — Other Ambulatory Visit (HOSPITAL_COMMUNITY): Payer: Self-pay

## 2021-05-17 MED ORDER — LEVOCETIRIZINE DIHYDROCHLORIDE 5 MG PO TABS
5.0000 mg | ORAL_TABLET | Freq: Every evening | ORAL | 2 refills | Status: DC
  Filled 2021-05-17: qty 90, 90d supply, fill #0
  Filled 2021-08-11: qty 90, 90d supply, fill #1
  Filled 2021-11-10: qty 90, 90d supply, fill #2

## 2021-05-18 ENCOUNTER — Other Ambulatory Visit (HOSPITAL_COMMUNITY): Payer: Self-pay

## 2021-05-19 ENCOUNTER — Other Ambulatory Visit (HOSPITAL_COMMUNITY): Payer: Self-pay

## 2021-05-20 DIAGNOSIS — M25562 Pain in left knee: Secondary | ICD-10-CM | POA: Diagnosis not present

## 2021-05-21 ENCOUNTER — Other Ambulatory Visit (HOSPITAL_COMMUNITY): Payer: Self-pay

## 2021-05-24 DIAGNOSIS — M25562 Pain in left knee: Secondary | ICD-10-CM | POA: Diagnosis not present

## 2021-05-25 ENCOUNTER — Other Ambulatory Visit (HOSPITAL_COMMUNITY): Payer: Self-pay

## 2021-05-26 ENCOUNTER — Other Ambulatory Visit (HOSPITAL_COMMUNITY): Payer: Self-pay

## 2021-06-03 ENCOUNTER — Ambulatory Visit: Payer: Medicare HMO

## 2021-06-04 ENCOUNTER — Other Ambulatory Visit (HOSPITAL_COMMUNITY): Payer: Self-pay

## 2021-06-04 MED ORDER — ELIQUIS 2.5 MG PO TABS
2.5000 mg | ORAL_TABLET | Freq: Two times a day (BID) | ORAL | 1 refills | Status: DC
  Filled 2021-06-04: qty 60, 30d supply, fill #0
  Filled 2021-07-06: qty 60, 30d supply, fill #1

## 2021-06-06 ENCOUNTER — Other Ambulatory Visit (HOSPITAL_COMMUNITY): Payer: Self-pay

## 2021-06-08 ENCOUNTER — Other Ambulatory Visit (HOSPITAL_COMMUNITY): Payer: Self-pay

## 2021-06-10 ENCOUNTER — Other Ambulatory Visit (HOSPITAL_COMMUNITY): Payer: Self-pay

## 2021-06-10 DIAGNOSIS — R262 Difficulty in walking, not elsewhere classified: Secondary | ICD-10-CM | POA: Diagnosis not present

## 2021-06-10 DIAGNOSIS — M25562 Pain in left knee: Secondary | ICD-10-CM | POA: Diagnosis not present

## 2021-06-14 ENCOUNTER — Encounter (HOSPITAL_BASED_OUTPATIENT_CLINIC_OR_DEPARTMENT_OTHER): Payer: Self-pay | Admitting: Family

## 2021-06-14 ENCOUNTER — Other Ambulatory Visit: Payer: Self-pay

## 2021-06-14 ENCOUNTER — Ambulatory Visit (INDEPENDENT_AMBULATORY_CARE_PROVIDER_SITE_OTHER): Payer: Medicare HMO | Admitting: Family

## 2021-06-14 VITALS — BP 134/80 | HR 73 | Ht 65.5 in | Wt 168.9 lb

## 2021-06-14 DIAGNOSIS — I10 Essential (primary) hypertension: Secondary | ICD-10-CM | POA: Diagnosis not present

## 2021-06-14 DIAGNOSIS — I491 Atrial premature depolarization: Secondary | ICD-10-CM | POA: Diagnosis not present

## 2021-06-14 DIAGNOSIS — Z86718 Personal history of other venous thrombosis and embolism: Secondary | ICD-10-CM | POA: Diagnosis not present

## 2021-06-14 DIAGNOSIS — I5020 Unspecified systolic (congestive) heart failure: Secondary | ICD-10-CM | POA: Diagnosis not present

## 2021-06-14 DIAGNOSIS — E782 Mixed hyperlipidemia: Secondary | ICD-10-CM | POA: Diagnosis not present

## 2021-06-14 DIAGNOSIS — I350 Nonrheumatic aortic (valve) stenosis: Secondary | ICD-10-CM

## 2021-06-14 DIAGNOSIS — I480 Paroxysmal atrial fibrillation: Secondary | ICD-10-CM

## 2021-06-14 DIAGNOSIS — I110 Hypertensive heart disease with heart failure: Secondary | ICD-10-CM | POA: Diagnosis not present

## 2021-06-14 DIAGNOSIS — Z8679 Personal history of other diseases of the circulatory system: Secondary | ICD-10-CM | POA: Diagnosis not present

## 2021-06-14 DIAGNOSIS — R296 Repeated falls: Secondary | ICD-10-CM | POA: Diagnosis not present

## 2021-06-14 NOTE — Patient Instructions (Addendum)
Medication Instructions:  ?Continue your current medications.  ? ?*If you need a refill on your cardiac medications before your next appointment, please call your pharmacy* ? ? ?Lab Work: ?Your physician recommends that you return for lab work Thursday at [facility], [place] ? ?If you have labs (blood work) drawn today and your tests are completely normal, you will receive your results only by: ?MyChart Message (if you have MyChart) OR ?A paper copy in the mail ?If you have any lab test that is abnormal or we need to change your treatment, we will call you to review the results. ? ? ?Testing/Procedures: ?Your EKG today shows normal sinus rhythm with occasional early beat called a PAC. This is very common and not of concern. Your Metoprolol will help prevent them from happening as often.  ? ? ?Follow-Up: ?At [facility], you and your health needs are our priority.  As part of our continuing mission to provide you with exceptional heart care, we have created designated Provider Care Teams.  These Care Teams include your primary Cardiologist (physician) and Advanced Practice Providers (APPs -  Physician Assistants and Nurse Practitioners) who all work together to provide you with the care you need, when you need it. ? ?We recommend signing up for the patient portal called "MyChart".  Sign up information is provided on this After Visit Summary.  MyChart is used to connect with patients for Virtual Visits (Telemedicine).  Patients are able to view lab/test results, encounter notes, upcoming appointments, etc.  Non-urgent messages can be sent to your provider as well.   ?To learn more about what you can do with MyChart, go to [email or web address].   ? ?Your next appointment:   ?In June as scheduled with Dr. Harrington Challenger ? ?Other Instructions ? ?Heart Healthy Diet Recommendations: ?A low-salt diet is recommended. Meats should be grilled, baked, or boiled. Avoid fried foods. Focus on lean protein sources like  fish or chicken with vegetables and fruits. The American Heart Association is a Microbiologist!  American Heart Association Diet and Lifeystyle Recommendations   ? ?Exercise recommendations: ?The American Heart Association recommends 150 minutes of moderate intensity exercise weekly. ?Try 30 minutes of moderate intensity exercise 4-5 times per week. ?This could include walking, jogging, or swimming. ?   ?

## 2021-06-14 NOTE — Progress Notes (Signed)
? ?Cardiology Clinic Note  ? ?Patient Name: Alexis Olson ?Date of Encounter: 06/16/2021 ? ?Primary Care Provider:  Mosie Lukes, MD ?Primary Cardiologist:  Dorris Carnes, MD ?Electrophysiologist:  None  ? ?Patient Profile  ?  ?Alexis Olson is a 88 y.o. female with a hx of paroxysmal atrial fibrillation, grade 1 diastolic dysfunction by echo 10/2019, mild aortic stenosis, mild to moderate tricuspid regurgitation, frequent falls, COVID-19 04/2020 not requiring hospitalization, hypertension, DVT, GERD, anxiety, rheumatic fever, HLD, SAH, arthritis, gastric bypass surgery, anemia. Presents today for follow up of PAF, diastolic dysfunction.  ? ?Past Medical History  ?  ?Past Medical History:  ?Diagnosis Date  ? ALLERGIC RHINITIS   ? ANEMIA, CHRONIC   ? 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 ANXIETY   ? Arthritis   ? ASTHMA   ? Bariatric surgery status   ? Bowel perforation ([place]) 2016  ? C. difficile diarrhea   ? Colon perforation ([place]) 08/2014  ? following polypetomy during colo, surgical repair  ? COLONIC POLYPS, ADENOMATOUS, HX OF 2010  ? DIVERTICULOSIS, COLON   ? DVT (deep venous thrombosis) ([place]) 2018  ? DVT (deep venous thrombosis) ([place])   ? GERD (gastroesophageal reflux disease) 08/23/2015  ? Graded compression stocking in place   ? History of blood transfusion   ? History of migraine   ? History of rheumatic fever   ? HSV (herpes simplex virus) infection 08/16/2015  ? Hyperlipidemia   ? Hypertension   ? Denies  ? Hyponatremia 05/02/2016  ? Incomplete left bundle branch block (LBBB) 2016  ? Noted on EKG  ? INSOMNIA   ? Lumbosacral disc disease   ? Chronic pain; History of osteomyelitis 2010 following ESI complication  ? Obesity   ? Osteomyelitis ([place])   ? Pelvic fracture ([facility])   ? Pneumonia   ? Recurrent UTI 08/23/2015  ? URINARY URGENCY   ? Ventral hernia without obstruction or gangrene 05/22/2018  ? VITAMIN B12 DEFICIENCY   ? ?Past Surgical History:  ?Procedure Laterality Date  ? BACK SURGERY   1994  ? for ruptured disc  ? CHOLECYSTECTOMY    ? COLONOSCOPY    ? greenfield filter    ? ileojejunal bypass  1976  ? for obesity  ? LAPAROTOMY N/A 09/18/2014  ? Procedure: EXPLORATORY LAPAROTOMY WITH REPAIR OF CECIAL PERFORATION;  Surgeon: Autumn Messing III, MD;  Location: [facility];  Service: General;  Laterality: N/A;  ? TONSILLECTOMY    ? TOTAL KNEE ARTHROPLASTY Right 08/26/2019  ? Procedure: RIGHT TOTAL KNEE ARTHROPLASTY, LEFT KNEE CORTISONE INJECTION;  Surgeon: Paralee Cancel, MD;  Location: [facility];  Service: Orthopedics;  Laterality: Right;  70 MINS  ? TOTAL KNEE ARTHROPLASTY Left 01/25/2021  ? Procedure: TOTAL KNEE ARTHROPLASTY;  Surgeon: Paralee Cancel, MD;  Location: [facility];  Service: Orthopedics;  Laterality: Left;  ? UPPER GI ENDOSCOPY    ? VASCULAR SURGERY Right   ? revascularization right lower extremity  ? ? ?Allergies ? ?Allergies  ?Allergen Reactions  ? Ativan [Lorazepam] Anaphylaxis and Other (See Comments)  ?  Deathly allergic; "resp rate dropped dropped to 6"  ? Other Anaphylaxis  ?  Red peppers--choking, also  ? Azithromycin Diarrhea  ? Benadryl [Diphenhydramine Hcl] Other (See Comments)  ?  Paradoxical response  ? Ciprofloxacin Hcl Other (See Comments)  ?  Causes yeast infection and refuses to take  ? Codeine Phosphate Nausea Only  ?  Can tolerate in limited amounts  ? Hydrocodone  Itching and Nausea And Vomiting  ? Hydrocodone Bit-[place] Mbr Itching and Nausea And Vomiting  ? Levaquin [Levofloxacin In D5w] Other (See Comments)  ?  Muscle soreness  ? Meperidine Hcl Other (See Comments)  ?  Reaction ??  ? Molds & Smuts Other (See Comments)  ?  unsure  ? Sulfa Antibiotics Other (See Comments)  ?  Joint pain  ? Tape Other (See Comments)  ?  No Coban wrap (per the patient)  ? Ultram [Tramadol Hcl] Other (See Comments)  ?  "Can't move joints"  ? ? ?History of Present Illness  ? Alexis Olson is a 88 y.o. female with a hx of paroxysmal atrial fibrillation, grade 1 diastolic dysfunction by echo 10/2019, mild  aortic stenosis, mild to moderate tricuspid regurgitation, frequent falls, COVID-19 04/2020 not requiring hospitalization, hypertension, DVT, GERD, anxiety, rheumatic fever, HLD, SAH, arthritis, gastric bypass surgery, anemia. She was last seen 12/03/20. ? 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 ? ?She underwent right TKA 08/2019. She was hospitalized 10/2019 after accidental fall with CT of head and C-spine without acute findings. Acute metabolic encephalopathy was likely due to polypharmacy. She had LLE acute DVT and was started on Eliquis. Considered provoked DVT in setting of previous surgery. Echo 10/2019 LVEF 60-65%, no RWMA, gr1DD, mild to moderate TR, mild aortic stenosis. Seen in the ED 11/2019 and 12/2019 for cellulitis of the LLE. Venous duplex 04/08/20 with chronic nonocclusive thrombus with septation in right and left common femoral vein and no evidence of acute DVT.She had COVID19 04/2020 and received monoclonal antibodies, but did not require hospitalization.  ? ?Admitted 07/05/20-07/10/20  with initial concern for septic arthritis but final diagnosis inflammatory arthritis. SNF was declined and home health arranged. She was readmitted 07/11/20 - 07/13/20 after fall. Imaging showed small punctuate subarachnoid hemorhage for which neurosurgery did not recommend intervention. Her Eliquis was discontinued. Her telemetry with frequent PAC's, occasional bradycardia, and one 2-3 second pause. She was discharged with 30 day Preventice monitor . Monitor with  predominantly normal sinus rhythm with average heart rate of 90 bpm. She had atrial fibrillation which was new finding with <1% burden and total length 2h53mn. She had frequent PAC's and rare PVC's. Dr. RHarrington Challengercalled to discuss and as she reported no palpitations and no recurrent falls, she was recommended to start Aspirin and referred to home health for vestibular training and objective evaluation of falls risk.Repeat CT  head 09/04/20 showed resolution of subarachnoid hemorrhage.  ? ?She was seen in follow-up 09/21/2020. She was recommended for updated echocardiogram for monitoring of mild aortic stenosis which was performed 11/15/20 showing newly reduced LVEF 45-50%, gr1DD, trivial MR, stable mild AS. At follow up 11/2020 she was doing overall well from cardiac perspective but recommended to remain off anticoagulation after discussion with Dr. RHarrington Challengerdue to falls and bleeding risk.  ? ?Very pleasant lady who presents for follow up. 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 Thankfully recuperating well. She continues to ambulate with a cane and is participating in PT once per week in the outpatient setting. Shares with me that her breathing has been good. Does note last week after having a few glasses of tea at a restaurant she had some ice cream and some abdominal cramping and took an oxycodone  then had some palpitations. Tells me she had an episode of feeling like her heart was racing but it quickly resolved. She has been restarted on Eliquis 2.'5mg'$  by Dr Alvan Dame of orthopedics per her report but no available documentation for my review. She reports no recent falls though does have some noted memory deficits during our interaction. ? ?EKGs/Labs/Other Studies Reviewed:  ? ?The following studies were reviewed today: ? ?Echo 11/15/2020 ?1. DIfficult acoustic windows make evaluation difficult. LVEF is  ?depressed with hypokinessis of the inferoseptum and Basal inferior  ?hypokinesis. Compared to echo from 2021, LVEF appears mildly worse. Left  ?ventricular ejection fraction, by estimation, is  ? 45 to 50%. The left ventricle has mildly decreased function. Left  ?ventricular diastolic parameters are consistent with Grade I diastolic  ?dysfunction (impaired relaxation). Elevated left atrial pressure.  ? 2. Right ventricular  systolic function is normal. The right ventricular  ?size is normal.  ? 3. Trivial mitral valve regurgitation.  ? 4. Tricuspid valve regurgitation is mild to moderate.  ? 5. AV is thickened, calcified with mildly

## 2021-06-16 ENCOUNTER — Other Ambulatory Visit: Payer: Medicare HMO

## 2021-06-16 ENCOUNTER — Telehealth: Payer: Self-pay | Admitting: Family

## 2021-06-16 ENCOUNTER — Other Ambulatory Visit (INDEPENDENT_AMBULATORY_CARE_PROVIDER_SITE_OTHER): Payer: Medicare HMO

## 2021-06-16 DIAGNOSIS — I480 Paroxysmal atrial fibrillation: Secondary | ICD-10-CM

## 2021-06-16 DIAGNOSIS — D649 Anemia, unspecified: Secondary | ICD-10-CM

## 2021-06-16 DIAGNOSIS — I10 Essential (primary) hypertension: Secondary | ICD-10-CM

## 2021-06-16 LAB — CBC WITH DIFFERENTIAL/PLATELET
Basophils Absolute: 0 10*3/uL (ref 0.0–0.1)
Basophils Relative: 0.4 % (ref 0.0–3.0)
Eosinophils Absolute: 0.2 10*3/uL (ref 0.0–0.7)
Eosinophils Relative: 2.9 % (ref 0.0–5.0)
HCT: 35.2 % — ABNORMAL LOW (ref 36.0–46.0)
Hemoglobin: 11.5 g/dL — ABNORMAL LOW (ref 12.0–15.0)
Lymphocytes Relative: 36.6 % (ref 12.0–46.0)
Lymphs Abs: 2.9 10*3/uL (ref 0.7–4.0)
MCHC: 32.5 g/dL (ref 30.0–36.0)
MCV: 92.6 fl (ref 78.0–100.0)
Monocytes Absolute: 0.7 10*3/uL (ref 0.1–1.0)
Monocytes Relative: 8.6 % (ref 3.0–12.0)
Neutro Abs: 4.1 10*3/uL (ref 1.4–7.7)
Neutrophils Relative %: 51.5 % (ref 43.0–77.0)
Platelets: 247 10*3/uL (ref 150.0–400.0)
RBC: 3.81 Mil/uL — ABNORMAL LOW (ref 3.87–5.11)
RDW: 15.3 % (ref 11.5–15.5)
WBC: 7.9 10*3/uL (ref 4.0–10.5)

## 2021-06-16 LAB — COMPREHENSIVE METABOLIC PANEL
ALT: 14 U/L (ref 0–35)
AST: 16 U/L (ref 0–37)
Albumin: 4.1 g/dL (ref 3.5–5.2)
Alkaline Phosphatase: 62 U/L (ref 39–117)
BUN: 18 mg/dL (ref 6–23)
CO2: 28 mEq/L (ref 19–32)
Calcium: 8.9 mg/dL (ref 8.4–10.5)
Chloride: 103 mEq/L (ref 96–112)
Creatinine, Ser: 0.57 mg/dL (ref 0.40–1.20)
GFR: 82.45 mL/min (ref >60.00)
Glucose, Bld: 85 mg/dL (ref 70–99)
Potassium: 4.3 mEq/L (ref 3.5–5.1)
Sodium: 136 mEq/L (ref 135–145)
Total Bilirubin: 0.3 mg/dL (ref 0.2–1.2)
Total Protein: 7.1 g/dL (ref 6.0–8.3)

## 2021-06-16 NOTE — Telephone Encounter (Signed)
-  Orders released ?-Pt verbalized understanding ? ?

## 2021-06-16 NOTE — Telephone Encounter (Signed)
Patient would like to go to the [facility] on [address].  She states it's closer to her.  Can the orders please be released?  She would like a call when that is done, so she knows she can go to the lab.  ?

## 2021-06-16 NOTE — Addendum Note (Signed)
Addended by: Meryl Crutch on: 06/16/2021 03:44 PM ? ? Modules accepted: Orders ? ?

## 2021-06-17 ENCOUNTER — Other Ambulatory Visit (HOSPITAL_COMMUNITY): Payer: Self-pay

## 2021-06-17 LAB — IRON,TIBC AND FERRITIN PANEL
%SAT: 31 % (calc) (ref 16–45)
Ferritin: 276 ng/mL (ref 16–288)
Iron: 75 ug/dL (ref 45–160)
TIBC: 243 mcg/dL (calc) — ABNORMAL LOW (ref 250–450)

## 2021-06-20 DIAGNOSIS — M25562 Pain in left knee: Secondary | ICD-10-CM | POA: Diagnosis not present

## 2021-06-22 ENCOUNTER — Other Ambulatory Visit: Payer: Self-pay | Admitting: Internal Medicine

## 2021-06-22 ENCOUNTER — Other Ambulatory Visit (HOSPITAL_COMMUNITY): Payer: Self-pay

## 2021-06-22 MED ORDER — HEMOCYTE PLUS 106-1 MG PO CAPS
1.0000 | ORAL_CAPSULE | Freq: Two times a day (BID) | ORAL | 1 refills | Status: DC
  Filled 2021-06-22: qty 60, 30d supply, fill #0
  Filled 2021-08-25: qty 60, 30d supply, fill #1

## 2021-06-23 ENCOUNTER — Other Ambulatory Visit (HOSPITAL_COMMUNITY): Payer: Self-pay

## 2021-06-23 ENCOUNTER — Telehealth: Payer: Self-pay | Admitting: Family Medicine

## 2021-06-23 NOTE — Telephone Encounter (Signed)
Patient's daughter would like a call back to discuss her mom's ambien medication. When asked to elaborate she did not want to. Please advise.  ?

## 2021-06-24 ENCOUNTER — Other Ambulatory Visit (HOSPITAL_COMMUNITY): Payer: Self-pay

## 2021-06-24 ENCOUNTER — Other Ambulatory Visit: Payer: Self-pay | Admitting: Family Medicine

## 2021-06-24 NOTE — Telephone Encounter (Signed)
Pt's daughter stated pt lost her Ambien. Pt thinks it may have been stolen but pt's daughter think she misplaced it. Pt is very anxious since she has been on medication for so long. Contacted pharmacy to see if they could help, pt notified pharmacy agreed to give her a few Ambien to last her to her next refill.  ?

## 2021-06-24 NOTE — Telephone Encounter (Signed)
Called pt's daughter. Left VM asking for return call. ?

## 2021-06-24 NOTE — Telephone Encounter (Signed)
Requesting: alprazolam 0.'5mg'$   ?Contract: 08/30/20 ?UDS: 11/11/20 ?Last Visit: 02/22/21 w/ Percell Miller ?Next Visit: 12/20/21 ?Last Refill: 03/29/21 #30 and 0RF ? ?Please Advise ? ?

## 2021-06-25 ENCOUNTER — Other Ambulatory Visit (HOSPITAL_COMMUNITY): Payer: Self-pay

## 2021-06-28 ENCOUNTER — Telehealth: Payer: Self-pay | Admitting: Family Medicine

## 2021-06-28 ENCOUNTER — Other Ambulatory Visit: Payer: Self-pay | Admitting: Family Medicine

## 2021-06-28 DIAGNOSIS — N761 Subacute and chronic vaginitis: Secondary | ICD-10-CM

## 2021-06-28 NOTE — Telephone Encounter (Signed)
Pt called citing that her bottom area is no bette rafter treating for yeast. Burns all the time. Would like a referral to GYN if you feel that would be of benefit.  ?

## 2021-06-30 ENCOUNTER — Other Ambulatory Visit: Payer: Self-pay | Admitting: Internal Medicine

## 2021-07-01 ENCOUNTER — Other Ambulatory Visit (HOSPITAL_COMMUNITY): Payer: Self-pay

## 2021-07-04 ENCOUNTER — Telehealth: Payer: Self-pay

## 2021-07-04 ENCOUNTER — Other Ambulatory Visit (HOSPITAL_COMMUNITY): Payer: Self-pay

## 2021-07-04 MED ORDER — OMEPRAZOLE 20 MG PO CPDR
20.0000 mg | DELAYED_RELEASE_CAPSULE | Freq: Every day | ORAL | 0 refills | Status: DC
  Filled 2021-07-04: qty 90, 90d supply, fill #0

## 2021-07-04 NOTE — Telephone Encounter (Signed)
Appt made with Dr Etter Sjogren for 07/05/21.  ?

## 2021-07-04 NOTE — Telephone Encounter (Signed)
Patient Name: Alexis Olson ?Gender: Female ?DOB: July 02, 1933 ?[employer] [facility] Client ?Client Site [facility] ?Provider Penni Homans - MD ?Contact Type Call ?Who Is Calling Patient / Member / Family / Caregiver ?Call Type Triage / Clinical ?Relationship To Patient Self ?Return Phone Number [phone] (Primary) ?Chief Complaint Vaginal Itching ?Reason for Call Symptomatic / Request for Health Information ?Initial Comment Caller states she is having vaginal itching. She has ?already taken Diflucan. ?[facility] Not Listed [facility] ?Translation No ?Nurse Assessment ?Nurse: Ronnald Ramp, RN, Rollene Fare Date/Time (Eastern Time): 07/01/2021 5:01:34 PM ?Confirm and document reason for call. If ?symptomatic, describe symptoms. ?---Caller states she is having vaginal itching. She has ?already taken Diflucan. Denies discharge. ? ?

## 2021-07-05 ENCOUNTER — Other Ambulatory Visit (HOSPITAL_COMMUNITY)
Admission: RE | Admit: 2021-07-05 | Discharge: 2021-07-05 | Disposition: A | Discharge status: Home / Self Care | Payer: Medicare HMO | Source: Ambulatory Visit | Attending: Family Medicine | Admitting: Family Medicine

## 2021-07-05 ENCOUNTER — Ambulatory Visit (INDEPENDENT_AMBULATORY_CARE_PROVIDER_SITE_OTHER): Payer: Medicare HMO | Admitting: Family Medicine

## 2021-07-05 ENCOUNTER — Telehealth (HOSPITAL_BASED_OUTPATIENT_CLINIC_OR_DEPARTMENT_OTHER): Payer: Self-pay

## 2021-07-05 ENCOUNTER — Encounter: Payer: Self-pay | Admitting: Family Medicine

## 2021-07-05 ENCOUNTER — Other Ambulatory Visit (HOSPITAL_COMMUNITY): Payer: Self-pay

## 2021-07-05 ENCOUNTER — Other Ambulatory Visit (HOSPITAL_BASED_OUTPATIENT_CLINIC_OR_DEPARTMENT_OTHER): Payer: Self-pay | Admitting: Family

## 2021-07-05 VITALS — BP 120/70 | HR 63 | Temp 97.8°F | Resp 18 | Ht 65.5 in | Wt 170.6 lb

## 2021-07-05 DIAGNOSIS — N761 Subacute and chronic vaginitis: Secondary | ICD-10-CM

## 2021-07-05 DIAGNOSIS — I480 Paroxysmal atrial fibrillation: Secondary | ICD-10-CM

## 2021-07-05 MED ORDER — METOPROLOL SUCCINATE ER 25 MG PO TB24
25.0000 mg | ORAL_TABLET | Freq: Every day | ORAL | 2 refills | Status: DC
  Filled 2021-07-05: qty 90, 90d supply, fill #0
  Filled 2021-10-11: qty 90, 90d supply, fill #1
  Filled 2022-01-06: qty 90, 90d supply, fill #2

## 2021-07-05 MED ORDER — TERCONAZOLE 0.4 % VA CREA
1.0000 | TOPICAL_CREAM | Freq: Every day | VAGINAL | 0 refills | Status: DC
  Filled 2021-07-05: qty 45, 7d supply, fill #0

## 2021-07-05 NOTE — Telephone Encounter (Signed)
Rx(s) sent to pharmacy electronically.  

## 2021-07-05 NOTE — Patient Instructions (Signed)
Vaginitis Vaginitis is a condition in which the vaginal tissue swells and becomes irritated. This condition is most often caused by a change in the normal balance of bacteria and yeast that live in the vagina. This change causes an overgrowth of certain bacteria or yeast, which causes the inflammation. There are different types of vaginitis. What are the causes? The cause of this condition depends on the type of vaginitis. It can be caused by: Bacteria (bacterial vaginosis). Yeast, which is a fungus (candidiasis). A parasite (trichomoniasis vaginitis). A virus (viral vaginitis). Low hormone levels (atrophic vaginitis). Low hormone levels can occur during pregnancy, breastfeeding, or after menopause. Irritants, such as bubble baths, scented tampons, and feminine sprays (allergic vaginitis). Other factors can change the normal balance of the yeast and bacteria that live in the vagina. These include: Antibiotic medicines. Poor hygiene. Diaphragms, vaginal sponges, spermicides, birth control pills, and intrauterine devices (IUDs). Sex. Infection. Uncontrolled diabetes. A weakened body defense system (immune system). What increases the risk? This condition is more likely to develop in women who: Smoke or are exposed to secondhand smoke. Use vaginal douches, scented tampons, or scented sanitary pads. Wear tight-fitting pants or thong underwear. Use oral birth control pills or an IUD. Have sex without a condom or have multiple partners. Have an STI. Frequently use the spermicide nonoxynol-9. Eat lots of foods high in sugar or who have uncontrolled diabetes. Have low estrogen levels. Have a weakened immune system from an immune disorder or medical treatment. Are pregnant or breastfeeding. What are the signs or symptoms? Symptoms vary depending on the cause of the vaginitis. Common symptoms include: Abnormal vaginal discharge. The discharge is white, gray, or yellow with bacterial  vaginosis. The discharge is thick, white, and cheesy with a yeast infection. The discharge is frothy and yellow or greenish with trichomoniasis. A bad vaginal smell. The smell is fishy with bacterial vaginosis. Vaginal itching, pain, or swelling. Pain with sex. Pain or burning when urinating. Sometimes there are no symptoms. How is this diagnosed? This condition is diagnosed based on your symptoms and medical history. A physical exam, including a pelvic exam, will also be done. You may also have other tests, including: Tests to determine the pH level (acidity or alkalinity) of your vagina. A whiff test to assess the odor that results when a sample of your vaginal discharge is mixed with a potassium hydroxide solution. Tests of vaginal fluid. A sample will be examined under a microscope. How is this treated? Treatment varies depending on the type of vaginitis you have. Your treatment may include: Antibiotic creams or pills to treat bacterial vaginosis and trichomoniasis. Antifungal medicines, such as vaginal creams or suppositories, to treat a yeast infection. Medicine to ease discomfort if you have viral vaginitis. Your sexual partner should also be treated. Estrogen delivered in a cream, pill, suppository, or vaginal ring to treat atrophic vaginitis. If vaginal dryness occurs, lubricants and moisturizing creams may help. You may need to avoid scented soaps, sprays, or douches. Stopping use of a product that is causing allergic vaginitis and then using a vaginal cream to treat the symptoms. Follow these instructions at home: Lifestyle Keep your genital area clean and dry. Avoid soap, and only rinse the area with water. Do not douche or use tampons until your health care provider says it is okay. Use sanitary pads, if needed. Do not have sex until your health care provider approves. When you can return to sex, practice safe sex and use condoms. Wipe from front to   back. This avoids the spread  of bacteria from the rectum to the vagina. General instructions Take over-the-counter and prescription medicines only as told by your health care provider. If you were prescribed an antibiotic medicine, take or use it as told by your health care provider. Do not stop taking or using the antibiotic even if you start to feel better. Keep all follow-up visits. This is important. How is this prevented? Use mild, unscented products. Do not use things that can irritate the vagina, such as fabric softeners. Avoid the following products if they are scented: Feminine sprays. Detergents. Tampons. Feminine hygiene products. Soaps or bubble baths. Let air reach your genital area. To do this: Wear cotton underwear to reduce moisture buildup. Avoid wearing underwear while you sleep. Avoid wearing tight pants and underwear or nylons without a cotton panel. Avoid wearing thong underwear. Take off any wet clothing, such as bathing [place], as soon as possible. Practice safe sex and use condoms. Contact a health care provider if: You have abdominal or pelvic pain. You have a fever or chills. You have symptoms that last for more than 2-3 days. Get help right away if: You have a fever and your symptoms suddenly get worse. Summary Vaginitis is a condition in which the vaginal tissue becomes inflamed.This condition is most often caused by a change in the normal balance of bacteria and yeast that live in the vagina. Treatment varies depending on the type of vaginitis you have. Do not douche, use tampons, or have sex until your health care provider approves. When you can return to sex, practice safe sex and use condoms. This information is not intended to replace advice given to you by your health care provider. Make sure you discuss any questions you have with your health care provider. Document Revised: 09/11/2019 Document Reviewed: 09/11/2019 Elsevier Patient Education  2022 Elsevier Inc.  

## 2021-07-05 NOTE — Telephone Encounter (Addendum)
Last picked up 3/13- 30 days- 1 refill ? ? ?----- Message from Loel Dubonnet, NP sent at 07/04/2021  8:36 AM EDT ----- ?Daphene Jaeger - when you are back Tuesday can we call her pharmacy to see when she last picked up Eliquis? I do not think she is taking it. I think when she said Dr. Alvan Dame Rx'd it she was confused because it is nowhere in his notes ?----- Message ----- ?From: Loel Dubonnet, NP ?Sent: 06/16/2021   8:37 PM EDT ?To: Loel Dubonnet, NP, Gerald Stabs, RN ? ?Did we ever get records froM Dr. Aurea Graff office? ? ?

## 2021-07-05 NOTE — Telephone Encounter (Signed)
Patient with paroxysmal atrial fib and persistent falls. Eliquis was on hold per recent cardiology visits. Ortho has resumed Eliquis 2.'5mg'$  BID. Patient reports no recent falls. Does not meet reduced dose criteria (though age 88 her creatinine is <1.5 and weight >60kg). CHA2DS2-VASc Score = 5 [CHF History: 0, HTN History: 1, Diabetes History: 0, Stroke History: 0, Vascular Disease History: 1, Age Score: 2, Gender Score: 1].  Therefore, the patient's annual risk of stroke is 7.2 %.     ? ?Will route to Dr. Harrington Challenger for her input on adjusting anticoagulation to full dose vs continuing to hold. ? ?Loel Dubonnet, NP  ? ? ?

## 2021-07-06 ENCOUNTER — Telehealth: Payer: Self-pay | Admitting: Family Medicine

## 2021-07-06 ENCOUNTER — Other Ambulatory Visit (HOSPITAL_COMMUNITY): Payer: Self-pay

## 2021-07-06 NOTE — Telephone Encounter (Signed)
Please advise 

## 2021-07-06 NOTE — Telephone Encounter (Signed)
Pt stated she forgot to mention she has an ulcer in her mouth and is hoping something can be sent in.  ? ?[facility]  ?[address] [place] [address]  ?Phone:  [phone]  Fax:  [phone]  ?DEA #:  [id number] ?

## 2021-07-07 ENCOUNTER — Other Ambulatory Visit (HOSPITAL_COMMUNITY): Payer: Self-pay

## 2021-07-07 ENCOUNTER — Other Ambulatory Visit: Payer: Self-pay | Admitting: Family Medicine

## 2021-07-07 DIAGNOSIS — K120 Recurrent oral aphthae: Secondary | ICD-10-CM

## 2021-07-07 LAB — CERVICOVAGINAL ANCILLARY ONLY
Bacterial Vaginitis (gardnerella): NEGATIVE
Candida Glabrata: NEGATIVE
Candida Vaginitis: POSITIVE — AB
Chlamydia: NEGATIVE
Comment: NEGATIVE
Comment: NEGATIVE
Comment: NEGATIVE
Comment: NEGATIVE
Comment: NEGATIVE
Comment: NORMAL
Neisseria Gonorrhea: NEGATIVE
Trichomonas: NEGATIVE

## 2021-07-07 MED ORDER — NYSTATIN 100000 UNIT/ML MT SUSP
Freq: Four times a day (QID) | OROMUCOSAL | 1 refills | Status: DC | PRN
  Filled 2021-07-07: qty 240, 12d supply, fill #0

## 2021-07-08 NOTE — Telephone Encounter (Signed)
Pt called. LVM with info ?

## 2021-07-10 NOTE — Telephone Encounter (Signed)
It has been awhile since I saw patinet    If she has gone through PT and is doing better without falling then go ahead and increase eliquis to 5 bid ?Pt needs to understand risks ?

## 2021-07-10 NOTE — Progress Notes (Signed)
? ?Subjective:  ? ? Patient ID: Alexis Olson, female    DOB: 09/25/1933, 88 y.o.   MRN: [id number] ? ?Chief Complaint  ?Patient presents with  ? Vaginitis  ? ? ?HPI ?Patient is in today for c/o vaginal itching   no other complaints  ? ?Past Medical History:  ?Diagnosis Date  ? ALLERGIC RHINITIS   ? ANEMIA, CHRONIC   ? 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 ANXIETY   ? Arthritis   ? ASTHMA   ? Bariatric surgery status   ? Bowel perforation ([place]) 2016  ? C. difficile diarrhea   ? Colon perforation ([place]) 08/2014  ? following polypetomy during colo, surgical repair  ? COLONIC POLYPS, ADENOMATOUS, HX OF 2010  ? DIVERTICULOSIS, COLON   ? DVT (deep venous thrombosis) ([place]) 2018  ? DVT (deep venous thrombosis) ([place])   ? GERD (gastroesophageal reflux disease) 08/23/2015  ? Graded compression stocking in place   ? History of blood transfusion   ? History of migraine   ? History of rheumatic fever   ? HSV (herpes simplex virus) infection 08/16/2015  ? Hyperlipidemia   ? Hypertension   ? Denies  ? Hyponatremia 05/02/2016  ? Incomplete left bundle branch block (LBBB) 2016  ? Noted on EKG  ? INSOMNIA   ? Lumbosacral disc disease   ? Chronic pain; History of osteomyelitis 2010 following ESI complication  ? Obesity   ? Osteomyelitis ([place])   ? Pelvic fracture ([facility])   ? Pneumonia   ? Recurrent UTI 08/23/2015  ? URINARY URGENCY   ? Ventral hernia without obstruction or gangrene 05/22/2018  ? VITAMIN B12 DEFICIENCY   ? ? ?Past Surgical History:  ?Procedure Laterality Date  ? BACK SURGERY  1994  ? for ruptured disc  ? CHOLECYSTECTOMY    ? COLONOSCOPY    ? greenfield filter    ? ileojejunal bypass  1976  ? for obesity  ? LAPAROTOMY N/A 09/18/2014  ? Procedure: EXPLORATORY LAPAROTOMY WITH REPAIR OF CECIAL PERFORATION;  Surgeon: Autumn Messing III, MD;  Location: [facility];  Service: General;  Laterality: N/A;  ? TONSILLECTOMY    ? TOTAL KNEE ARTHROPLASTY Right 08/26/2019  ? Procedure: RIGHT TOTAL KNEE ARTHROPLASTY, LEFT KNEE  CORTISONE INJECTION;  Surgeon: Paralee Cancel, MD;  Location: [facility];  Service: Orthopedics;  Laterality: Right;  70 MINS  ? TOTAL KNEE ARTHROPLASTY Left 01/25/2021  ? Procedure: TOTAL KNEE ARTHROPLASTY;  Surgeon: Paralee Cancel, MD;  Location: [facility];  Service: Orthopedics;  Laterality: Left;  ? UPPER GI ENDOSCOPY    ? VASCULAR SURGERY Right   ? revascularization right lower extremity  ? ? ?Family History  ?Problem Relation Age of Onset  ? Cervical cancer Mother   ? Stroke Mother   ? Liver disease Sister   ? Kidney disease Sister   ? Cirrhosis Sister   ? Colon cancer Neg Hx   ? Esophageal cancer Neg Hx   ? Rectal cancer Neg Hx   ? Stomach cancer Neg Hx   ? ? ?Social History  ? ?Socioeconomic History  ? Marital status: Married  ?  Spouse name: Not on file  ? Number of children: 3  ? Years of education: Not on file  ? Highest education level: Not on file  ?Occupational History  ? Occupation: retired Therapist, sports  ?  Employer: RETIRED  ?Tobacco Use  ? Smoking status: Never  ? Smokeless tobacco: Never  ?Vaping Use  ? Vaping Use: Never  used  ?Substance and Sexual Activity  ? Alcohol use: No  ?  Alcohol/week: 0.0 standard drinks  ? Drug use: No  ? Sexual activity: Never  ?  Partners: Male  ?Other Topics Concern  ? Not on file  ?Social History Narrative  ? Lives with spouse, Indep ADLs  ? Supportive family nearby  ? No dietary restrictions  ? ?Social Determinants of Health  ? ?Financial Resource Strain: Not on file  ?Food Insecurity: No Food Insecurity  ? Worried About Charity fundraiser in the Last Year: Never true  ? Ran Out of Food in the Last Year: Never true  ?Transportation Needs: No Transportation Needs  ? Lack of Transportation (Medical): No  ? Lack of Transportation (Non-Medical): No  ?Physical Activity: Not on file  ?Stress: Not on file  ?Social Connections: Not on file  ?Intimate Partner Violence: Not on file  ? ? ?Outpatient Medications Prior to Visit  ?Medication Sig Dispense Refill  ? acetaminophen (TYLENOL) 500 MG  tablet Take 2 tablets (1,000 mg total) by mouth every 8 (eight) hours. (Patient taking differently: Take 1,000 mg by mouth every 8 (eight) hours as needed for mild pain.) 30 tablet 0  ? albuterol (PROVENTIL HFA;VENTOLIN HFA) 108 (90 BASE) MCG/ACT inhaler Inhale 1-2 puffs into the lungs every 6 (six) hours as needed for wheezing or shortness of breath.    ? ALPRAZolam (XANAX) 0.5 MG tablet Take 1 tablet by mouth at bedtime as needed for anxiety. 30 tablet 3  ? amitriptyline (ELAVIL) 100 MG tablet Take 1 tablet (100 mg total) by mouth daily. 90 tablet 1  ? amoxicillin (AMOXIL) 500 MG capsule Take 4 capsules by mouth 1 hour prior to dental work 4 capsule 4  ? amoxicillin (AMOXIL) 500 MG capsule Take 1 capsule by mouth 3 times daily for 7 days 21 capsule 0  ? apixaban (ELIQUIS) 2.5 MG TABS tablet Take 1 tablet by mouth every 12 hours. 60 tablet 1  ? azelastine (OPTIVAR) 0.05 % ophthalmic solution Place 1 drop into both eyes daily as needed (for dry eyes).    ? benzonatate (TESSALON) 100 MG capsule TAKE 1 CAPSULE BY MOUTH 3 TIMES DAILY AS NEEDED FOR COUGH (Patient taking differently: Take 100 mg by mouth 3 (three) times daily as needed for cough.) 30 capsule 0  ? budesonide-formoterol (SYMBICORT) 160-4.5 MCG/ACT inhaler Inhale 2 puffs into the lungs See admin instructions. Inhale 2 puffs into the lungs one to two times a day 1 Inhaler 2  ? cetirizine (ZYRTEC) 10 MG tablet Take 10 mg by mouth daily.     ? COVID-19 mRNA Vac-TriS, Pfizer, SUSP injection Inject into the muscle. 0.3 mL 0  ? cyanocobalamin (,VITAMIN B-12,) 1000 MCG/ML injection INJECT 1CC INTO THE MUSCLE EVERY MONTH (Patient taking differently: Inject 1,000 mcg into the muscle every 30 (thirty) days.) 10 mL 1  ? dicyclomine (BENTYL) 10 MG capsule Take 1 capsule by mouth 3 times daily as needed for diarrhea and symptoms. 270 capsule 0  ? dicyclomine (BENTYL) 20 MG tablet Take 1/2 to 1 tablet by mouth 3 times daily as needed. 90 tablet 3  ?  diphenoxylate-atropine (LOMOTIL) 2.5-0.025 MG tablet Take 1 to 2 tablets by mouth up to 4 times a day 180 tablet 3  ? docusate sodium (COLACE) 100 MG capsule Take 1 capsule (100 mg total) by mouth 2 (two) times daily. 10 capsule 0  ? Fe Fum-FA-B Cmp-C-Zn-Mg-Mn-Cu (HEMOCYTE PLUS) 106-1 MG CAPS TAKE 1 CAPSULE BY MOUTH TWO  TIMES DAILY 60 capsule 1  ? fluconazole (DIFLUCAN) 150 MG tablet Take 1 tablet (150 mg total) by mouth once a week. 2 tablet 1  ? fluticasone (FLONASE) 50 MCG/ACT nasal spray Place 2 sprays into both nostrils daily.    ? gabapentin (NEURONTIN) 300 MG capsule TAKE 1 CAPSULE BY MOUTH AT BEDTIME 90 capsule 1  ? hydrOXYzine (ATARAX) 25 MG tablet Take 25 mg by mouth 3 (three) times daily as needed for itching.    ? hydrOXYzine (ATARAX) 25 MG tablet Take 1 tablet by mouth three times day. 90 tablet 1  ? levocetirizine (XYZAL) 5 MG tablet Take 1 tablet by mouth in the evening 90 tablet 2  ? magic mouthwash (nystatin, hydrocortisone, diphenhydrAMINE) suspension Swish, gargle and spit 5 to 10 mLs 4 times daily as needed. 160 mL 1  ? magnesium oxide (MAG-OX) 400 MG tablet Take 400 mg by mouth 2 (two) times daily.    ? methocarbamol (ROBAXIN) 500 MG tablet Take 1 tablet (500 mg total) by mouth every 6 (six) hours as needed for muscle spasms. 40 tablet 0  ? metoprolol succinate (TOPROL XL) 25 MG 24 hr tablet Take 1 tablet (25 mg total) by mouth daily. 90 tablet 2  ? montelukast (SINGULAIR) 10 MG tablet Take 10 mg by mouth at bedtime.    ? montelukast (SINGULAIR) 10 MG tablet Take 1 tablet by mouth in the evening (Patient taking differently: Take 10 mg by mouth at bedtime.) 90 tablet 2  ? montelukast (SINGULAIR) 10 MG tablet Take 1 tablet by mouth in the evening once daily 90 tablet 2  ? omeprazole (PRILOSEC) 20 MG capsule Take 1 capsule (20 mg total) by mouth daily. 90 capsule 0  ? ondansetron (ZOFRAN-ODT) 4 MG disintegrating tablet Take 1 tablet (4 mg total) by mouth 3 (three) times daily as needed for nausea  or vomiting. 20 tablet 0  ? oxyCODONE (OXY IR/ROXICODONE) 5 MG immediate release tablet Take 1-2 tablets (5-10 mg total) by mouth every 8 hours as needed for severe pain (pain score 4-6). 30 tablet 0  ? phenazopyridine (PYRIDIUM) 100

## 2021-07-11 ENCOUNTER — Other Ambulatory Visit (HOSPITAL_COMMUNITY): Payer: Self-pay

## 2021-07-13 ENCOUNTER — Telehealth: Payer: Self-pay | Admitting: Family

## 2021-07-13 ENCOUNTER — Other Ambulatory Visit (HOSPITAL_COMMUNITY): Payer: Self-pay

## 2021-07-13 MED ORDER — APIXABAN 5 MG PO TABS
5.0000 mg | ORAL_TABLET | Freq: Two times a day (BID) | ORAL | 3 refills | Status: DC
  Filled 2021-07-13: qty 180, 90d supply, fill #0
  Filled 2021-11-07: qty 60, 30d supply, fill #1
  Filled 2022-07-12: qty 60, 30d supply, fill #2
  Filled 2022-07-12 (×2): qty 180, 90d supply, fill #2

## 2021-07-13 NOTE — Telephone Encounter (Signed)
Patient returned call to the office and states that she had sometime to think about it and while she doesn't like the idea of going up to Eliquis '5mg'$  but if it is what is best she will try it. New prescription sent to pharmacy.  ?

## 2021-07-13 NOTE — Telephone Encounter (Signed)
Please call patient to let her know as she has not had recent falls, we will plan to optimize her Eliquis dose. Please send Rx for Eliquis '5mg'$  BID. The 2.'5mg'$  BID is not strong enough to protect from stroke due to atrial fib for her. Please educate patient to report any blood in urine or stool. Additionally, if she falls and hits her head will need to go to ED for evaluation.  ? ?We discussed risk of Eliquis vs risk of stroke at our most recent clinic visit and she preferred to remain on Eliquis due to her atrial fibrillation.  ? ?Loel Dubonnet, NP  ?

## 2021-07-13 NOTE — Telephone Encounter (Signed)
Please see addendum of earlier note.  ?

## 2021-07-13 NOTE — Telephone Encounter (Signed)
RN returned call to patient to provide the following recommendations, patient is not willing to go back up to '5mg'$  dose, due to the fact that she has not fallen in over a year and says she is doing well on the 2.'5mg'$ . Patient educated on risk of stroke due to A. Fib and patient further declines a medication change at this time.  ? ?Routing to Laurann Montana, NP and Dorris Carnes, MD for FYI ? ? ? ?"Please call patient to let her know as she has not had recent falls, we will plan to optimize her Eliquis dose. Please send Rx for Eliquis '5mg'$  BID. The 2.'5mg'$  BID is not strong enough to protect from stroke due to atrial fib for her. Please educate patient to report any blood in urine or stool. Additionally, if she falls and hits her head will need to go to ED for evaluation.  ?  ?We discussed risk of Eliquis vs risk of stroke at our most recent clinic visit and she preferred to remain on Eliquis due to her atrial fibrillation.  ?  ?Loel Dubonnet, NP " ?

## 2021-07-13 NOTE — Telephone Encounter (Signed)
Pt returning call. Please advise. °

## 2021-07-13 NOTE — Addendum Note (Signed)
Addended by: Gerald Stabs on: 07/13/2021 03:02 PM ? ? Modules accepted: Orders ? ?

## 2021-07-14 ENCOUNTER — Other Ambulatory Visit (HOSPITAL_COMMUNITY): Payer: Self-pay

## 2021-07-14 NOTE — Telephone Encounter (Signed)
The therapeutic dose for Eliquis for her age/wt / kidney function is 5 mg 2x per day      ?SHe has had rare documeneted afib    PResent     ?Recommended therapeutic dose to prevent stroke would be 5 mg 2x per day    ?Watch for falls, bleeding      ?

## 2021-07-18 ENCOUNTER — Other Ambulatory Visit (HOSPITAL_COMMUNITY): Payer: Self-pay

## 2021-07-19 ENCOUNTER — Other Ambulatory Visit (HOSPITAL_COMMUNITY): Payer: Self-pay

## 2021-07-23 ENCOUNTER — Other Ambulatory Visit: Payer: Self-pay | Admitting: Family Medicine

## 2021-07-25 ENCOUNTER — Other Ambulatory Visit (HOSPITAL_COMMUNITY): Payer: Self-pay

## 2021-07-25 MED ORDER — ALPRAZOLAM 0.5 MG PO TABS
0.5000 mg | ORAL_TABLET | Freq: Every evening | ORAL | 3 refills | Status: DC | PRN
  Filled 2021-07-25: qty 30, 30d supply, fill #0
  Filled 2021-08-23: qty 30, 30d supply, fill #1
  Filled 2021-09-22: qty 30, 30d supply, fill #2
  Filled 2021-10-19: qty 30, 30d supply, fill #3

## 2021-07-25 NOTE — Telephone Encounter (Signed)
Requesting: alprazolam 0.'5mg'$   ?Contract: 08/30/20 ?UDS: 11/11/20 ?Last Visit: 07/05/21 w/ Dr. Etter Sjogren ?Next Visit: 12/20/21 ?Last Refill: 03/29/21 #30 and 0RF ? ?Please Advise ? ?

## 2021-07-26 ENCOUNTER — Other Ambulatory Visit (HOSPITAL_COMMUNITY): Payer: Self-pay

## 2021-07-28 ENCOUNTER — Ambulatory Visit (INDEPENDENT_AMBULATORY_CARE_PROVIDER_SITE_OTHER): Payer: Medicare HMO

## 2021-07-28 VITALS — Ht 65.5 in | Wt 170.0 lb

## 2021-07-28 DIAGNOSIS — Z0000 Encounter for general adult medical examination without abnormal findings: Secondary | ICD-10-CM | POA: Diagnosis not present

## 2021-07-28 NOTE — Patient Instructions (Signed)
Alexis Olson , ?Thank you for taking time to come for your Medicare Wellness Visit. I appreciate your ongoing commitment to your health goals. Please review the following plan we discussed and let me know if I can assist you in the future.  ? ?Screening recommendations/referrals: ?Colonoscopy: No longer required due to age. ?Mammogram: No longer required due to age. ?Bone Density: 02/25/2018 Repeat every 2 years ? ?Recommended yearly ophthalmology/optometry visit for glaucoma screening and checkup ?Recommended yearly dental visit for hygiene and checkup ? ?Vaccinations: ?Influenza vaccine: Done 03/25/2021 Repeat annually ? ?Pneumococcal vaccine: Done 02/12/2014 and 03/15/2021 ?Tdap vaccine: Done 02/12/2014 Repeat in 10 years ? ?Shingles vaccine: Done 02/06/2012   ?Covid-19:Done 11/12/2020, 03/15/2021, 03/05/2020, 05/12/2019 and 04/20/2019. ? ?Advanced directives: Copies in chart.  ? ?Conditions/risks identified: Aim for 30 minutes of exercise or brisk walking, 6-8 glasses of water, and 5 servings of fruits and vegetables each day. ? ? ?Next appointment: Follow up in one year for your annual wellness visit 2024. ? ? ?Preventive Care 12 Years and Older, Female ?Preventive care refers to lifestyle choices and visits with your health care provider that can promote health and wellness. ?What does preventive care include? ?A yearly physical exam. This is also called an annual well check. ?Dental exams once or twice a year. ?Routine eye exams. Ask your health care provider how often you should have your eyes checked. ?Personal lifestyle choices, including: ?Daily care of your teeth and gums. ?Regular physical activity. ?Eating a healthy diet. ?Avoiding tobacco and drug use. ?Limiting alcohol use. ?Practicing safe sex. ?Taking low-dose aspirin every day. ?Taking vitamin and mineral supplements as recommended by your health care provider. ?What happens during an annual well check? ?The services and screenings done by your health  care provider during your annual well check will depend on your age, overall health, lifestyle risk factors, and family history of disease. ?Counseling  ?Your health care provider may ask you questions about your: ?Alcohol use. ?Tobacco use. ?Drug use. ?Emotional well-being. ?Home and relationship well-being. ?Sexual activity. ?Eating habits. ?History of falls. ?Memory and ability to understand (cognition). ?Work and work Statistician. ?Reproductive health. ?Screening  ?You may have the following tests or measurements: ?Height, weight, and BMI. ?Blood pressure. ?Lipid and cholesterol levels. These may be checked every 5 years, or more frequently if you are over 83 years old. ?Skin check. ?Lung cancer screening. You may have this screening every year starting at age 60 if you have a 30-pack-year history of smoking and currently smoke or have quit within the past 15 years. ?Fecal occult blood test (FOBT) of the stool. You may have this test every year starting at age 73. ?Flexible sigmoidoscopy or colonoscopy. You may have a sigmoidoscopy every 5 years or a colonoscopy every 10 years starting at age 49. ?Hepatitis C blood test. ?Hepatitis B blood test. ?Sexually transmitted disease (STD) testing. ?Diabetes screening. This is done by checking your blood sugar (glucose) after you have not eaten for a while (fasting). You may have this done every 1-3 years. ?Bone density scan. This is done to screen for osteoporosis. You may have this done starting at age 33. ?Mammogram. This may be done every 1-2 years. Talk to your health care provider about how often you should have regular mammograms. ?Talk with your health care provider about your test results, treatment options, and if necessary, the need for more tests. ?Vaccines  ?Your health care provider may recommend certain vaccines, such as: ?Influenza vaccine. This is recommended every year. ?Tetanus,  diphtheria, and acellular pertussis (Tdap, Td) vaccine. You may need a Td  booster every 10 years. ?Zoster vaccine. You may need this after age 51. ?Pneumococcal 13-valent conjugate (PCV13) vaccine. One dose is recommended after age 75. ?Pneumococcal polysaccharide (PPSV23) vaccine. One dose is recommended after age 62. ?Talk to your health care provider about which screenings and vaccines you need and how often you need them. ?This information is not intended to replace advice given to you by your health care provider. Make sure you discuss any questions you have with your health care provider. ?Document Released: 04/09/2015 Document Revised: 12/01/2015 Document Reviewed: 01/12/2015 ?Elsevier Interactive Patient Education ? 2017 [employer]. ? ?Fall Prevention in the Home ?Falls can cause injuries. They can happen to people of all ages. There are many things you can do to make your home safe and to help prevent falls. ?What can I do on the outside of my home? ?Regularly fix the edges of walkways and driveways and fix any cracks. ?Remove anything that might make you trip as you walk through a door, such as a raised step or threshold. ?Trim any bushes or trees on the path to your home. ?Use bright outdoor lighting. ?Clear any walking paths of anything that might make someone trip, such as rocks or tools. ?Regularly check to see if handrails are loose or broken. Make sure that both sides of any steps have handrails. ?Any raised decks and porches should have guardrails on the edges. ?Have any leaves, snow, or ice cleared regularly. ?Use sand or salt on walking paths during winter. ?Clean up any spills in your garage right away. This includes oil or grease spills. ?What can I do in the bathroom? ?Use night lights. ?Install grab bars by the toilet and in the tub and shower. Do not use towel bars as grab bars. ?Use non-skid mats or decals in the tub or shower. ?If you need to sit down in the shower, use a plastic, non-slip stool. ?Keep the floor dry. Clean up any water that spills on the floor  as soon as it happens. ?Remove soap buildup in the tub or shower regularly. ?Attach bath mats securely with double-sided non-slip rug tape. ?Do not have throw rugs and other things on the floor that can make you trip. ?What can I do in the bedroom? ?Use night lights. ?Make sure that you have a light by your bed that is easy to reach. ?Do not use any sheets or blankets that are too big for your bed. They should not hang down onto the floor. ?Have a firm chair that has side arms. You can use this for support while you get dressed. ?Do not have throw rugs and other things on the floor that can make you trip. ?What can I do in the kitchen? ?Clean up any spills right away. ?Avoid walking on wet floors. ?Keep items that you use a lot in easy-to-reach places. ?If you need to reach something above you, use a strong step stool that has a grab bar. ?Keep electrical cords out of the way. ?Do not use floor polish or wax that makes floors slippery. If you must use wax, use non-skid floor wax. ?Do not have throw rugs and other things on the floor that can make you trip. ?What can I do with my stairs? ?Do not leave any items on the stairs. ?Make sure that there are handrails on both sides of the stairs and use them. Fix handrails that are broken or loose. Make  sure that handrails are as long as the stairways. ?Check any carpeting to make sure that it is firmly attached to the stairs. Fix any carpet that is loose or worn. ?Avoid having throw rugs at the top or bottom of the stairs. If you do have throw rugs, attach them to the floor with carpet tape. ?Make sure that you have a light switch at the top of the stairs and the bottom of the stairs. If you do not have them, ask someone to add them for you. ?What else can I do to help prevent falls? ?Wear shoes that: ?Do not have high heels. ?Have rubber bottoms. ?Are comfortable and fit you well. ?Are closed at the toe. Do not wear sandals. ?If you use a stepladder: ?Make sure that it is  fully opened. Do not climb a closed stepladder. ?Make sure that both sides of the stepladder are locked into place. ?Ask someone to hold it for you, if possible. ?Clearly mark and make sure that you ca

## 2021-07-28 NOTE — Progress Notes (Signed)
? ?Subjective:  ? Alexis Olson is a 88 y.o. female who presents for Medicare Annual (Subsequent) preventive examination. ?Virtual Visit via Telephone Note ? ?I connected with  Alexis Olson on 07/28/21 at  2:45 PM EDT by telephone and verified that I am speaking with the correct person using two identifiers. ? ?Location: ?Patient: HOME ?Provider: [facility] ?Persons participating in the virtual visit: patient/Nurse Health Advisor ?  ?I discussed the limitations, risks, security and privacy concerns of performing an evaluation and management service by telephone and the availability of in person appointments. The patient expressed understanding and agreed to proceed. ? ?Interactive audio and video telecommunications were attempted between this nurse and patient, however failed, due to patient having technical difficulties OR patient did not have access to video capability.  We continued and completed visit with audio only. ? ?Some vital signs may be absent or patient reported.  ? ?Chriss Driver, LPN ? ?Review of Systems    ? ?Cardiac Risk Factors include: advanced age (>90mn, >>70women);hypertension;dyslipidemia;sedentary lifestyle;Other (see comment), Risk factor comments: AFIB ? ?   ?Objective:  ?  ?Today's Vitals  ? 07/28/21 1444  ?Weight: 170 lb (77.1 kg)  ?Height: 5' 5.5" (1.664 m)  ? ?Body mass index is 27.86 kg/m?. ? ? ?  07/28/2021  ?  2:51 PM 01/25/2021  ?  6:32 PM 01/17/2021  ?  2:13 PM 07/11/2020  ? 11:29 PM 07/06/2020  ?  1:00 AM 07/05/2020  ?  5:57 PM 09/19/2019  ?  4:25 PM  ?Advanced Directives  ?Does Patient Have a Medical Advance Directive? Yes Yes Yes No Yes Yes Yes  ?Type [place]care Power of [place] ?Does patient want to make changes to medical advance directive?  No - Patient declined   No - Patient declined     ?Copy of [place] Chart? Yes - validated most recent copy scanned in chart (See row information) No - copy requested No - copy requested      ? ? ?Current Medications (verified) ?Outpatient Encounter Medications as of 07/28/2021  ?Medication Sig  ? acetaminophen (TYLENOL) 500 MG tablet Take 2 tablets (1,000 mg total) by mouth every 8 (eight) hours. (Patient taking differently: Take 1,000 mg by mouth every 8 (eight) hours as needed for mild pain.)  ? albuterol (PROVENTIL HFA;VENTOLIN HFA) 108 (90 BASE) MCG/ACT inhaler Inhale 1-2 puffs into the lungs every 6 (six) hours as needed for wheezing or shortness of breath.  ? ALPRAZolam (XANAX) 0.5 MG tablet Take 1 tablet by mouth at bedtime as needed for anxiety.  ? amitriptyline (ELAVIL) 100 MG tablet Take 1 tablet (100 mg total) by mouth daily.  ? amoxicillin (AMOXIL) 500 MG capsule Take 4 capsules by mouth 1 hour prior to dental work  ? amoxicillin (AMOXIL) 500 MG capsule Take 1 capsule by mouth 3 times daily for 7 days  ? apixaban (ELIQUIS) 5 MG TABS tablet Take 1 tablet (5 mg total) by mouth every 12 (twelve) hours.  ? azelastine (OPTIVAR) 0.05 % ophthalmic solution Place 1 drop into both eyes daily as needed (for dry eyes).  ? benzonatate (TESSALON) 100 MG capsule TAKE 1 CAPSULE BY MOUTH 3 TIMES DAILY AS NEEDED FOR COUGH (Patient taking differently: Take 100 mg by mouth 3 (three) times daily as needed for cough.)  ? budesonide-formoterol (SYMBICORT)  160-4.5 MCG/ACT inhaler Inhale 2 puffs into the lungs See admin instructions. Inhale 2 puffs into the lungs one to two times a day  ? cetirizine (ZYRTEC) 10 MG tablet Take 10 mg by mouth daily.   ? COVID-19 mRNA Vac-TriS, Pfizer, SUSP injection Inject into the muscle.  ? cyanocobalamin (,VITAMIN B-12,) 1000 MCG/ML injection INJECT 1CC INTO THE MUSCLE EVERY MONTH (Patient taking differently: Inject 1,000 mcg into the muscle every 30 (thirty) days.)  ? dicyclomine (BENTYL) 10 MG capsule Take 1 capsule  by mouth 3 times daily as needed for diarrhea and symptoms.  ? dicyclomine (BENTYL) 20 MG tablet Take 1/2 to 1 tablet by mouth 3 times daily as needed.  ? diphenoxylate-atropine (LOMOTIL) 2.5-0.025 MG tablet Take 1 to 2 tablets by mouth up to 4 times a day  ? docusate sodium (COLACE) 100 MG capsule Take 1 capsule (100 mg total) by mouth 2 (two) times daily.  ? Duke's Swish 5 ml around mouth & spit out 4 times a day as needed for mouth pain.  ? Fe Fum-FA-B Cmp-C-Zn-Mg-Mn-Cu (HEMOCYTE PLUS) 106-1 MG CAPS TAKE 1 CAPSULE BY MOUTH TWO TIMES DAILY  ? fluconazole (DIFLUCAN) 150 MG tablet Take 1 tablet (150 mg total) by mouth once a week.  ? fluticasone (FLONASE) 50 MCG/ACT nasal spray Place 2 sprays into both nostrils daily.  ? gabapentin (NEURONTIN) 300 MG capsule TAKE 1 CAPSULE BY MOUTH AT BEDTIME  ? hydrOXYzine (ATARAX) 25 MG tablet Take 25 mg by mouth 3 (three) times daily as needed for itching.  ? hydrOXYzine (ATARAX) 25 MG tablet Take 1 tablet by mouth three times day.  ? levocetirizine (XYZAL) 5 MG tablet Take 1 tablet by mouth in the evening  ? magic mouthwash (nystatin, hydrocortisone, diphenhydrAMINE) suspension Swish, gargle and spit 5 to 10 mLs 4 times daily as needed.  ? magnesium oxide (MAG-OX) 400 MG tablet Take 400 mg by mouth 2 (two) times daily.  ? methocarbamol (ROBAXIN) 500 MG tablet Take 1 tablet (500 mg total) by mouth every 6 (six) hours as needed for muscle spasms.  ? metoprolol succinate (TOPROL XL) 25 MG 24 hr tablet Take 1 tablet (25 mg total) by mouth daily.  ? montelukast (SINGULAIR) 10 MG tablet Take 10 mg by mouth at bedtime.  ? montelukast (SINGULAIR) 10 MG tablet Take 1 tablet by mouth in the evening (Patient taking differently: Take 10 mg by mouth at bedtime.)  ? montelukast (SINGULAIR) 10 MG tablet Take 1 tablet by mouth in the evening once daily  ? omeprazole (PRILOSEC) 20 MG capsule Take 1 capsule (20 mg total) by mouth daily.  ? ondansetron (ZOFRAN-ODT) 4 MG disintegrating tablet Take  1 tablet (4 mg total) by mouth 3 (three) times daily as needed for nausea or vomiting.  ? oxyCODONE (OXY IR/ROXICODONE) 5 MG immediate release tablet Take 1-2 tablets (5-10 mg total) by mouth every 8 hours as needed for severe pain (pain score 4-6).  ? phenazopyridine (PYRIDIUM) 100 MG tablet Take 1 tablet (100 mg total) by mouth 3 (three) times daily as needed for pain.  ? polyethylene glycol powder (GLYCOLAX/MIRALAX) 17 GM/SCOOP powder Mix 17 g in 4 oz of water or juice and drink by mouth daily as needed for mild constipation.  ? potassium chloride SA (KLOR-CON M) 20 MEQ tablet Take 1 tablet (20 mEq total) by mouth daily.  ? Probiotic Product (PROBIOTIC DAILY PO) Take 1 capsule by mouth daily.  ? terconazole (TERAZOL 7) 0.4 % vaginal cream Insert 1 applicator  vaginally at bedtime.  ? Vitamin D, Ergocalciferol, (DRISDOL) 1.25 MG (50000 UNIT) CAPS capsule TAKE 1 CAPSULE BY MOUTH EVERY 7 DAYS  ? zolpidem (AMBIEN) 5 MG tablet TAKE 1 TABLET BY MOUTH AT BEDTIME  ? valACYclovir (VALTREX) 500 MG tablet TAKE 1 TABLET BY MOUTH ONCE A DAY AS NEEDED. (Patient taking differently: Take 500 mg by mouth daily as needed (cold sore).)  ? ?No facility-administered encounter medications on file as of 07/28/2021.  ? ? ?Allergies (verified) ?Ativan [lorazepam], Other, Azithromycin, Benadryl [diphenhydramine hcl], Ciprofloxacin hcl, Codeine phosphate, Hydrocodone, Hydrocodone bit-homatrop mbr, Levaquin [levofloxacin in d5w], Meperidine hcl, Molds & smuts, Sulfa antibiotics, Tape, and Ultram [tramadol hcl]  ? ?History: ?Past Medical History:  ?Diagnosis Date  ? ALLERGIC RHINITIS   ? ANEMIA, CHRONIC   ? 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 ANXIETY   ? Arthritis   ? ASTHMA   ? Bariatric surgery status   ? Bowel perforation ([place]) 2016  ? C. difficile diarrhea   ? Colon perforation ([place]) 08/2014  ? following polypetomy during colo, surgical repair  ? COLONIC POLYPS, ADENOMATOUS, HX OF 2010  ? DIVERTICULOSIS, COLON   ?  DVT (deep venous thrombosis) ([place]) 2018  ? DVT (deep venous thrombosis) ([place])   ? GERD (gastroesophageal reflux disease) 08/23/2015  ? Graded compression stocking in place   ? History of blood transfusion

## 2021-08-05 ENCOUNTER — Other Ambulatory Visit (HOSPITAL_COMMUNITY): Payer: Self-pay

## 2021-08-08 ENCOUNTER — Other Ambulatory Visit: Payer: Self-pay | Admitting: Family Medicine

## 2021-08-08 ENCOUNTER — Telehealth (HOSPITAL_BASED_OUTPATIENT_CLINIC_OR_DEPARTMENT_OTHER): Payer: Self-pay | Admitting: Family

## 2021-08-08 NOTE — Telephone Encounter (Signed)
RN returned call to patient to explain why she needs to be on the '5mg'$  dose of Eliquis. Patient states that she does not like the '5mg'$  dose and she cannot explain why. Patient verbalizes understanding of dosage. Reminded patient of upcoming appointment.  ? ? ? ? ?"The therapeutic dose for Eliquis for her age/wt / kidney function is 5 mg 2x per day      ?SHe has had rare documeneted afib    PResent     ?Recommended therapeutic dose to prevent stroke would be 5 mg 2x per day    ?Watch for falls, bleeding "  ?

## 2021-08-08 NOTE — Telephone Encounter (Signed)
Pt c/o medication issue: ? ?1. Name of Medication: apixaban (ELIQUIS) 5 MG TABS tablet ? ?2. How are you currently taking this medication (dosage and times per day)?  ? ?3. Are you having a reaction (difficulty breathing--STAT)? no ? ?4. What is your medication issue? Patient was not sure why the dosage of the medication went up from 2.5 mg to 5 mg bid. Patient would like clarification from [facility] regarding the dose change  ?

## 2021-08-09 ENCOUNTER — Other Ambulatory Visit (HOSPITAL_COMMUNITY): Payer: Self-pay

## 2021-08-09 MED ORDER — VITAMIN D (ERGOCALCIFEROL) 1.25 MG (50000 UNIT) PO CAPS
ORAL_CAPSULE | ORAL | 0 refills | Status: DC
  Filled 2021-08-09: qty 12, 84d supply, fill #0

## 2021-08-09 NOTE — Telephone Encounter (Signed)
Last vit d checked 04/01/18 and was 22.49 ?

## 2021-08-11 ENCOUNTER — Other Ambulatory Visit (HOSPITAL_COMMUNITY): Payer: Self-pay

## 2021-08-16 ENCOUNTER — Ambulatory Visit (HOSPITAL_BASED_OUTPATIENT_CLINIC_OR_DEPARTMENT_OTHER): Payer: Medicare HMO | Admitting: Obstetrics & Gynecology

## 2021-08-23 ENCOUNTER — Other Ambulatory Visit (HOSPITAL_COMMUNITY): Payer: Self-pay

## 2021-08-23 ENCOUNTER — Other Ambulatory Visit: Payer: Self-pay | Admitting: Family Medicine

## 2021-08-23 DIAGNOSIS — N761 Subacute and chronic vaginitis: Secondary | ICD-10-CM

## 2021-08-24 ENCOUNTER — Other Ambulatory Visit (HOSPITAL_COMMUNITY): Payer: Self-pay

## 2021-08-24 NOTE — Telephone Encounter (Signed)
Pt has appt on 09/12/21 with gyn, can we refill?

## 2021-08-25 ENCOUNTER — Other Ambulatory Visit (HOSPITAL_COMMUNITY): Payer: Self-pay

## 2021-08-25 MED ORDER — TERCONAZOLE 0.4 % VA CREA
1.0000 | TOPICAL_CREAM | Freq: Every day | VAGINAL | 0 refills | Status: DC
  Filled 2021-08-25: qty 45, 7d supply, fill #0

## 2021-08-26 ENCOUNTER — Other Ambulatory Visit (HOSPITAL_COMMUNITY): Payer: Self-pay

## 2021-08-27 ENCOUNTER — Other Ambulatory Visit (HOSPITAL_COMMUNITY): Payer: Self-pay

## 2021-09-01 ENCOUNTER — Ambulatory Visit: Payer: Medicare HMO

## 2021-09-01 ENCOUNTER — Other Ambulatory Visit: Payer: Self-pay

## 2021-09-01 ENCOUNTER — Telehealth: Payer: Self-pay | Admitting: Family Medicine

## 2021-09-01 DIAGNOSIS — R350 Frequency of micturition: Secondary | ICD-10-CM

## 2021-09-01 NOTE — Telephone Encounter (Signed)
Pt received a urine cup on 4/11 from [facility].   Pt states she still has the cup from her appointment and would like to bring in a urine sample. No orders currently, pls advise.

## 2021-09-01 NOTE — Telephone Encounter (Signed)
UA and culture ordered

## 2021-09-02 ENCOUNTER — Other Ambulatory Visit: Payer: Self-pay | Admitting: Family Medicine

## 2021-09-02 ENCOUNTER — Other Ambulatory Visit (INDEPENDENT_AMBULATORY_CARE_PROVIDER_SITE_OTHER): Payer: Medicare HMO

## 2021-09-02 ENCOUNTER — Other Ambulatory Visit (HOSPITAL_COMMUNITY): Payer: Self-pay

## 2021-09-02 DIAGNOSIS — R350 Frequency of micturition: Secondary | ICD-10-CM | POA: Diagnosis not present

## 2021-09-02 LAB — URINALYSIS, ROUTINE W REFLEX MICROSCOPIC
Bilirubin Urine: NEGATIVE
Ketones, ur: NEGATIVE
Nitrite: POSITIVE — AB
Specific Gravity, Urine: <=1.005 — AB (ref 1.000–1.030)
Total Protein, Urine: NEGATIVE
Urine Glucose: NEGATIVE
Urobilinogen, UA: 0.2 (ref 0.0–1.0)
pH: 6 (ref 5.0–8.0)

## 2021-09-02 MED ORDER — CEFDINIR 300 MG PO CAPS
300.0000 mg | ORAL_CAPSULE | Freq: Two times a day (BID) | ORAL | 0 refills | Status: DC
  Filled 2021-09-02: qty 10, 5d supply, fill #0

## 2021-09-05 LAB — URINE CULTURE
MICRO NUMBER:: 13506460
SPECIMEN QUALITY:: ADEQUATE

## 2021-09-06 ENCOUNTER — Other Ambulatory Visit (HOSPITAL_COMMUNITY): Payer: Self-pay

## 2021-09-06 MED ORDER — FLUTICASONE PROPIONATE 50 MCG/ACT NA SUSP
NASAL | 3 refills | Status: DC
  Filled 2021-09-06: qty 16, 30d supply, fill #0

## 2021-09-07 ENCOUNTER — Other Ambulatory Visit: Payer: Self-pay | Admitting: Family Medicine

## 2021-09-07 ENCOUNTER — Other Ambulatory Visit (HOSPITAL_COMMUNITY): Payer: Self-pay

## 2021-09-08 ENCOUNTER — Other Ambulatory Visit (HOSPITAL_COMMUNITY): Payer: Self-pay

## 2021-09-08 MED ORDER — GABAPENTIN 300 MG PO CAPS
ORAL_CAPSULE | Freq: Every day | ORAL | 1 refills | Status: DC
  Filled 2021-09-08: qty 90, 90d supply, fill #0
  Filled 2021-12-08: qty 90, 90d supply, fill #1

## 2021-09-12 ENCOUNTER — Encounter (HOSPITAL_BASED_OUTPATIENT_CLINIC_OR_DEPARTMENT_OTHER): Payer: Self-pay | Admitting: Obstetrics & Gynecology

## 2021-09-12 ENCOUNTER — Other Ambulatory Visit (HOSPITAL_COMMUNITY)
Admission: RE | Admit: 2021-09-12 | Discharge: 2021-09-12 | Disposition: A | Discharge status: Home / Self Care | Payer: Medicare HMO | Source: Ambulatory Visit | Attending: Obstetrics & Gynecology | Admitting: Obstetrics & Gynecology

## 2021-09-12 ENCOUNTER — Ambulatory Visit (HOSPITAL_BASED_OUTPATIENT_CLINIC_OR_DEPARTMENT_OTHER): Payer: Medicare HMO | Admitting: Obstetrics & Gynecology

## 2021-09-12 ENCOUNTER — Other Ambulatory Visit (HOSPITAL_COMMUNITY): Payer: Self-pay

## 2021-09-12 VITALS — BP 129/75 | HR 75 | Ht 65.5 in | Wt 175.4 lb

## 2021-09-12 DIAGNOSIS — N9089 Other specified noninflammatory disorders of vulva and perineum: Secondary | ICD-10-CM | POA: Diagnosis not present

## 2021-09-12 DIAGNOSIS — N898 Other specified noninflammatory disorders of vagina: Secondary | ICD-10-CM

## 2021-09-12 DIAGNOSIS — Z87440 Personal history of urinary (tract) infections: Secondary | ICD-10-CM

## 2021-09-12 DIAGNOSIS — R319 Hematuria, unspecified: Secondary | ICD-10-CM | POA: Diagnosis not present

## 2021-09-12 LAB — POCT URINALYSIS DIPSTICK
Bilirubin, UA: NEGATIVE
Blood, UA: NEGATIVE
Glucose, UA: NEGATIVE
Ketones, UA: NEGATIVE
Nitrite, UA: NEGATIVE
Protein, UA: NEGATIVE
Spec Grav, UA: <=1.005 — AB (ref 1.010–1.025)
Urobilinogen, UA: 0.2 E.U./dL
pH, UA: 5 (ref 5.0–8.0)

## 2021-09-12 MED ORDER — TRIAMCINOLONE ACETONIDE 0.025 % EX OINT
1.0000 | TOPICAL_OINTMENT | Freq: Two times a day (BID) | CUTANEOUS | 0 refills | Status: AC
  Filled 2021-09-12: qty 30, 15d supply, fill #0

## 2021-09-12 NOTE — Patient Instructions (Addendum)
Once I get the test results back, my office will call and tell you if any new medication is needed.  If this is negative, I will want you to start using Aquaphor topically every since day to help the skin.

## 2021-09-12 NOTE — Progress Notes (Unsigned)
GYNECOLOGY  VISIT  CC:   ***  HPI: 88 y.o. G3P3 Married White or Caucasian female here for possible yeast infection.  She wears depends and has urinary incontinence as well as diarrhea at times.  She has been treated with oral (diflucan) and topical antifungal cream (terconazole) over the past three months.  She did have the terconazole refilled.  She is still symptomatic with itching and irritation.  She also feels "uncomfortable" at times.  Was just treated for a UTI and she brought a specimen with her today.     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 difficile diarrhea    Colon perforation ([place]) 08/2014   following polypetomy during colo, surgical repair   COLONIC POLYPS, ADENOMATOUS, HX OF 2010   DIVERTICULOSIS, COLON    DVT (deep venous thrombosis) ([place]) 2018   DVT (deep venous thrombosis) ([facility])    GERD (gastroesophageal reflux disease) 08/23/2015   Graded compression stocking in place    History of blood transfusion    History of migraine    History of rheumatic fever    HSV (herpes simplex virus) infection 08/16/2015   Hyperlipidemia    Hypertension    Denies   Hyponatremia 05/02/2016   Incomplete left bundle branch block (LBBB) 2016   Noted on EKG   INSOMNIA    Lumbosacral disc disease    Chronic pain; History of osteomyelitis 2010 following ESI complication   Obesity    Osteomyelitis ([facility])    Pelvic fracture ([facility])    Pneumonia    Recurrent UTI 08/23/2015   URINARY URGENCY    Ventral hernia without obstruction or gangrene 05/22/2018   VITAMIN B12 DEFICIENCY     MEDS:   Current Outpatient Medications on File Prior to Visit  Medication Sig Dispense Refill   acetaminophen (TYLENOL) 500 MG tablet Take 2 tablets (1,000 mg total) by mouth every 8 (eight) hours. (Patient  taking differently: Take 1,000 mg by mouth every 8 (eight) hours as needed for mild pain.) 30 tablet 0   albuterol (PROVENTIL HFA;VENTOLIN HFA) 108 (90 BASE) MCG/ACT inhaler Inhale 1-2 puffs into the lungs every 6 (six) hours as needed for wheezing or shortness of breath.     ALPRAZolam (XANAX) 0.5 MG tablet Take 1 tablet by mouth at bedtime as needed for anxiety. 30 tablet 3   amitriptyline (ELAVIL) 100 MG tablet Take 1 tablet (100 mg total) by mouth daily. 90 tablet 1   amoxicillin (AMOXIL) 500 MG capsule Take 4 capsules by mouth 1 hour prior to dental work 4 capsule 4   amoxicillin (AMOXIL) 500 MG capsule Take 1 capsule by mouth 3 times daily for 7 days 21 capsule 0   apixaban (ELIQUIS) 5 MG TABS tablet Take 1 tablet (5 mg total) by mouth every 12 (twelve) hours. 180 tablet 3   azelastine (OPTIVAR) 0.05 % ophthalmic solution Place 1 drop into both eyes daily as needed (for dry eyes).     budesonide-formoterol (SYMBICORT) 160-4.5 MCG/ACT inhaler Inhale 2 puffs into the lungs See admin instructions. Inhale 2 puffs into the lungs one to two times a day 1 Inhaler 2   cefdinir (OMNICEF) 300 MG capsule Take 1 capsule (300 mg total) by mouth 2 (two) times  daily 10 capsule 0   cetirizine (ZYRTEC) 10 MG tablet Take 10 mg by mouth daily.      COVID-19 mRNA Vac-TriS, Pfizer, SUSP injection Inject into the muscle. 0.3 mL 0   cyanocobalamin (,VITAMIN B-12,) 1000 MCG/ML injection INJECT 1CC INTO THE MUSCLE EVERY MONTH (Patient taking differently: Inject 1,000 mcg into the muscle every 30 (thirty) days.) 10 mL 1   dicyclomine (BENTYL) 10 MG capsule Take 1 capsule by mouth 3 times daily as needed for diarrhea and symptoms. 270 capsule 0   dicyclomine (BENTYL) 20 MG tablet Take 1/2 to 1 tablet by mouth 3 times daily as needed. 90 tablet 3   diphenoxylate-atropine (LOMOTIL) 2.5-0.025 MG tablet Take 1 to 2 tablets by mouth up to 4 times a day 180 tablet 3   docusate sodium (COLACE) 100 MG capsule Take 1 capsule (100  mg total) by mouth 2 (two) times daily. 10 capsule 0   Duke's Swish 5 ml around mouth & spit out 4 times a day as needed for mouth pain. 240 mL 1   Fe Fum-FA-B Cmp-C-Zn-Mg-Mn-Cu (HEMOCYTE PLUS) 106-1 MG CAPS TAKE 1 CAPSULE BY MOUTH TWO TIMES DAILY 60 capsule 1   fluconazole (DIFLUCAN) 150 MG tablet Take 1 tablet (150 mg total) by mouth once a week. 2 tablet 1   fluticasone (FLONASE) 50 MCG/ACT nasal spray Place 2 sprays into both nostrils daily.     fluticasone (FLONASE) 50 MCG/ACT nasal spray Place 1 to 2 sprays in each nostril once a day 16 g 3   gabapentin (NEURONTIN) 300 MG capsule TAKE 1 CAPSULE BY MOUTH AT BEDTIME 90 capsule 1   hydrOXYzine (ATARAX) 25 MG tablet Take 25 mg by mouth 3 (three) times daily as needed for itching.     hydrOXYzine (ATARAX) 25 MG tablet Take 1 tablet by mouth three times day. 90 tablet 1   levocetirizine (XYZAL) 5 MG tablet Take 1 tablet by mouth in the evening 90 tablet 2   magic mouthwash (nystatin, hydrocortisone, diphenhydrAMINE) suspension Swish, gargle and spit 5 to 10 mLs 4 times daily as needed. 160 mL 1   magnesium oxide (MAG-OX) 400 MG tablet Take 400 mg by mouth 2 (two) times daily.     methocarbamol (ROBAXIN) 500 MG tablet Take 1 tablet (500 mg total) by mouth every 6 (six) hours as needed for muscle spasms. 40 tablet 0   metoprolol succinate (TOPROL XL) 25 MG 24 hr tablet Take 1 tablet (25 mg total) by mouth daily. 90 tablet 2   montelukast (SINGULAIR) 10 MG tablet Take 10 mg by mouth at bedtime.     montelukast (SINGULAIR) 10 MG tablet Take 1 tablet by mouth in the evening (Patient taking differently: Take 10 mg by mouth at bedtime.) 90 tablet 2   montelukast (SINGULAIR) 10 MG tablet Take 1 tablet by mouth in the evening once daily 90 tablet 2   omeprazole (PRILOSEC) 20 MG capsule Take 1 capsule (20 mg total) by mouth daily. 90 capsule 0   ondansetron (ZOFRAN-ODT) 4 MG disintegrating tablet Take 1 tablet (4 mg total) by mouth 3 (three) times daily as  needed for nausea or vomiting. 20 tablet 0   oxyCODONE (OXY IR/ROXICODONE) 5 MG immediate release tablet Take 1-2 tablets (5-10 mg total) by mouth every 8 hours as needed for severe pain (pain score 4-6). 30 tablet 0   phenazopyridine (PYRIDIUM) 100 MG tablet Take 1 tablet (100 mg total) by mouth 3 (three) times daily as needed for pain. [place]  tablet 0   polyethylene glycol powder (GLYCOLAX/MIRALAX) 17 GM/SCOOP powder Mix 17 g in 4 oz of water or juice and drink by mouth daily as needed for mild constipation. 238 g 0   potassium chloride SA (KLOR-CON M) 20 MEQ tablet Take 1 tablet (20 mEq total) by mouth daily. 90 tablet 1   Probiotic Product (PROBIOTIC DAILY PO) Take 1 capsule by mouth daily.     terconazole (TERAZOL 7) 0.4 % vaginal cream Insert 1 applicator vaginally at bedtime. 45 g 0   Vitamin D, Ergocalciferol, (DRISDOL) 1.25 MG (50000 UNIT) CAPS capsule TAKE 1 CAPSULE BY MOUTH EVERY 7 DAYS 12 capsule 0   zolpidem (AMBIEN) 5 MG tablet TAKE 1 TABLET BY MOUTH AT BEDTIME 90 tablet 1   valACYclovir (VALTREX) 500 MG tablet TAKE 1 TABLET BY MOUTH ONCE A DAY AS NEEDED. (Patient taking differently: Take 500 mg by mouth daily as needed (cold sore).) 30 tablet 5   No current facility-administered medications on file prior to visit.    ALLERGIES: Ativan [lorazepam], Other, Azithromycin, Benadryl [diphenhydramine hcl], Ciprofloxacin hcl, Codeine phosphate, Hydrocodone, Hydrocodone bit-homatrop mbr, Levaquin [levofloxacin in d5w], Meperidine hcl, Molds & smuts, Sulfa antibiotics, Tape, and Ultram [tramadol hcl]  SH:  ***  ROS  PHYSICAL EXAMINATION:    BP 129/75 (BP Location: Right Arm, Patient Position: Sitting, Cuff Size: Large)   Pulse 75   Ht 5' 5.5" (1.664 m) Comment: reported  Wt 175 lb 6.4 oz (79.6 kg)   BMI 28.74 kg/m     General appearance: alert, cooperative and appears stated age Neck: no adenopathy, supple, symmetrical, trachea midline and thyroid {CHL AMB PHY EX THYROID NORM  DEFAULT:[phone]::"normal to inspection and palpation"} CV:  {Exam; heart brief:31539} Lungs:  {pe lungs ob:314451} Breasts: {Exam; breast:13139::"normal appearance, no masses or tenderness"} Abdomen: soft, non-tender; bowel sounds normal; no masses,  no organomegaly Lymph:  no inguinal LAD noted  Pelvic: External genitalia:  no lesions              Urethra:  normal appearing urethra with no masses, tenderness or lesions              Bartholins and Skenes: normal                 Vagina: normal appearing vagina with normal color and discharge, no lesions              Cervix: {CHL AMB PHY EX CERVIX NORM DEFAULT:[id number]::"no lesions"}              Bimanual Exam:  Uterus:  {CHL AMB PHY EX UTERUS NORM [phone]::"normal size, contour, position, consistency, mobility, non-tender"}              Adnexa: {CHL AMB PHY EX ADNEXA NO MASS DEFAULT:[phone]::"no mass, fullness, tenderness"}              Rectovaginal: {yes no:[id number]}.  Confirms.              Anus:  normal sphincter tone, no lesions  Chaperone, ***, CMA, was present for exam.  Assessment/Plan: There are no diagnoses linked to this encounter.

## 2021-09-13 ENCOUNTER — Other Ambulatory Visit (HOSPITAL_COMMUNITY): Payer: Self-pay

## 2021-09-13 LAB — CERVICOVAGINAL ANCILLARY ONLY
Bacterial Vaginitis (gardnerella): NEGATIVE
Candida Glabrata: NEGATIVE
Candida Vaginitis: POSITIVE — AB
Comment: NEGATIVE
Comment: NEGATIVE
Comment: NEGATIVE

## 2021-09-14 LAB — URINE CULTURE

## 2021-09-15 ENCOUNTER — Other Ambulatory Visit (HOSPITAL_COMMUNITY): Payer: Self-pay

## 2021-09-15 ENCOUNTER — Encounter (HOSPITAL_BASED_OUTPATIENT_CLINIC_OR_DEPARTMENT_OTHER): Payer: Self-pay | Admitting: Obstetrics & Gynecology

## 2021-09-15 ENCOUNTER — Other Ambulatory Visit: Payer: Self-pay | Admitting: Family Medicine

## 2021-09-15 MED ORDER — TERCONAZOLE 0.4 % VA CREA
1.0000 | TOPICAL_CREAM | Freq: Every day | VAGINAL | 0 refills | Status: DC
  Filled 2021-09-15: qty 45, 7d supply, fill #0

## 2021-09-15 MED ORDER — NITROFURANTOIN MONOHYD MACRO 100 MG PO CAPS
100.0000 mg | ORAL_CAPSULE | Freq: Two times a day (BID) | ORAL | 0 refills | Status: AC
  Filled 2021-09-15: qty 14, 7d supply, fill #0

## 2021-09-15 NOTE — Addendum Note (Signed)
Addended by: Blenda Nicely on: 09/15/2021 10:54 AM   Modules accepted: Orders

## 2021-09-16 ENCOUNTER — Other Ambulatory Visit (HOSPITAL_COMMUNITY): Payer: Self-pay

## 2021-09-16 MED ORDER — ZOLPIDEM TARTRATE 5 MG PO TABS
ORAL_TABLET | Freq: Every day | ORAL | 1 refills | Status: DC
Start: 2021-09-16 — End: 2022-03-21
  Filled 2021-09-16 – 2021-09-20 (×2): qty 90, 90d supply, fill #0
  Filled 2021-12-17: qty 90, 90d supply, fill #1

## 2021-09-17 ENCOUNTER — Other Ambulatory Visit (HOSPITAL_COMMUNITY): Payer: Self-pay

## 2021-09-20 ENCOUNTER — Other Ambulatory Visit (HOSPITAL_COMMUNITY): Payer: Self-pay

## 2021-09-21 ENCOUNTER — Encounter: Payer: Self-pay | Admitting: Internal Medicine

## 2021-09-21 ENCOUNTER — Ambulatory Visit: Payer: Medicare HMO | Admitting: Internal Medicine

## 2021-09-21 VITALS — BP 110/70 | HR 89 | Ht 65.5 in | Wt 175.6 lb

## 2021-09-21 DIAGNOSIS — I480 Paroxysmal atrial fibrillation: Secondary | ICD-10-CM

## 2021-09-21 DIAGNOSIS — I10 Essential (primary) hypertension: Secondary | ICD-10-CM

## 2021-09-21 DIAGNOSIS — I4891 Unspecified atrial fibrillation: Secondary | ICD-10-CM

## 2021-09-21 DIAGNOSIS — I491 Atrial premature depolarization: Secondary | ICD-10-CM

## 2021-09-21 DIAGNOSIS — I5020 Unspecified systolic (congestive) heart failure: Secondary | ICD-10-CM | POA: Diagnosis not present

## 2021-09-21 NOTE — Progress Notes (Unsigned)
Cardiology Office Note   Date:  09/21/2021   ID:  Alexis Olson, DOB [birth year], MRN [id number]  PCP:  Mosie Lukes, MD  Cardiologist:   Dorris Carnes, MD   Patient presents for follow up of afib        History of Present Illness: Alexis Olson is a 88 y.o. female with a history of PAF, Diastolic dysfunction, mild AS, HTN, DVT (post surgery), HL, SAH, obesity (s/p gastric byass) who presents for follow up       She was last seen in cardiology clinc by Vella Raring at our [place] clinic   The pt had hx of fall in Aug 2021 (after TKA in June)   Felt to be due to metabolic encephalopathy due to polypharmacy.   In April 2022 she had another fall aat home after treatment for a septic arthritic.   Found small SAH   Eliquis dicontinued.     She wore a monitor after and was found to have 2 h 47 min afib (<1% burden)   Followed closely after that not on anticoagulation due to concern for falling       Current Meds  Medication Sig   acetaminophen (TYLENOL) 500 MG tablet Take 2 tablets (1,000 mg total) by mouth every 8 (eight) hours. (Patient taking differently: Take 1,000 mg by mouth every 8 (eight) hours as needed for mild pain.)   albuterol (PROVENTIL HFA;VENTOLIN HFA) 108 (90 BASE) MCG/ACT inhaler Inhale 1-2 puffs into the lungs every 6 (six) hours as needed for wheezing or shortness of breath.   ALPRAZolam (XANAX) 0.5 MG tablet Take 1 tablet by mouth at bedtime as needed for anxiety.   amitriptyline (ELAVIL) 100 MG tablet Take 1 tablet (100 mg total) by mouth daily.   amoxicillin (AMOXIL) 500 MG capsule Take 4 capsules by mouth 1 hour prior to dental work   apixaban (ELIQUIS) 5 MG TABS tablet Take 1 tablet (5 mg total) by mouth every 12 (twelve) hours.   azelastine (OPTIVAR) 0.05 % ophthalmic solution Place 1 drop into both eyes daily as needed (for dry eyes).   budesonide-formoterol (SYMBICORT) 160-4.5 MCG/ACT inhaler Inhale 2 puffs into the lungs See admin instructions. Inhale 2 puffs  into the lungs one to two times a day   cetirizine (ZYRTEC) 10 MG tablet Take 10 mg by mouth daily.    COVID-19 mRNA Vac-TriS, Pfizer, SUSP injection Inject into the muscle.   cyanocobalamin (,VITAMIN B-12,) 1000 MCG/ML injection INJECT 1CC INTO THE MUSCLE EVERY MONTH (Patient taking differently: Inject 1,000 mcg into the muscle every 30 (thirty) days.)   dicyclomine (BENTYL) 10 MG capsule Take 1 capsule by mouth 3 times daily as needed for diarrhea and symptoms.   dicyclomine (BENTYL) 20 MG tablet Take 1/2 to 1 tablet by mouth 3 times daily as needed.   diphenoxylate-atropine (LOMOTIL) 2.5-0.025 MG tablet Take 1 to 2 tablets by mouth up to 4 times a day   Fe Fum-FA-B Cmp-C-Zn-Mg-Mn-Cu (HEMOCYTE PLUS) 106-1 MG CAPS TAKE 1 CAPSULE BY MOUTH TWO TIMES DAILY   fluticasone (FLONASE) 50 MCG/ACT nasal spray Place 1 to 2 sprays in each nostril once a day   gabapentin (NEURONTIN) 300 MG capsule TAKE 1 CAPSULE BY MOUTH AT BEDTIME   hydrOXYzine (ATARAX) 25 MG tablet Take 25 mg by mouth 3 (three) times daily as needed for itching.   levocetirizine (XYZAL) 5 MG tablet Take 1 tablet by mouth in the evening   magnesium oxide (MAG-OX) 400 MG tablet Take  400 mg by mouth 2 (two) times daily.   methocarbamol (ROBAXIN) 500 MG tablet Take 1 tablet (500 mg total) by mouth every 6 (six) hours as needed for muscle spasms.   metoprolol succinate (TOPROL XL) 25 MG 24 hr tablet Take 1 tablet (25 mg total) by mouth daily.   montelukast (SINGULAIR) 10 MG tablet Take 1 tablet by mouth in the evening once daily   nitrofurantoin, macrocrystal-monohydrate, (MACROBID) 100 MG capsule Take 1 capsule (100 mg total) by mouth 2 (two) times daily for 7 days.   omeprazole (PRILOSEC) 20 MG capsule Take 1 capsule (20 mg total) by mouth daily.   ondansetron (ZOFRAN-ODT) 4 MG disintegrating tablet Take 1 tablet (4 mg total) by mouth 3 (three) times daily as needed for nausea or vomiting.   oxyCODONE (OXY IR/ROXICODONE) 5 MG immediate release  tablet Take 1-2 tablets (5-10 mg total) by mouth every 8 hours as needed for severe pain (pain score 4-6).   phenazopyridine (PYRIDIUM) 100 MG tablet Take 1 tablet (100 mg total) by mouth 3 (three) times daily as needed for pain.   polyethylene glycol powder (GLYCOLAX/MIRALAX) 17 GM/SCOOP powder Mix 17 g in 4 oz of water or juice and drink by mouth daily as needed for mild constipation.   potassium chloride SA (KLOR-CON M) 20 MEQ tablet Take 1 tablet (20 mEq total) by mouth daily.   Probiotic Product (PROBIOTIC DAILY PO) Take 1 capsule by mouth daily.   terconazole (TERAZOL 7) 0.4 % vaginal cream Insert 1 applicator vaginally at bedtime for 7 nights.  Apply externally as well nightly.   triamcinolone (KENALOG) 0.025 % ointment Apply 1 application topically 2 (two) times daily.   valACYclovir (VALTREX) 500 MG tablet TAKE 1 TABLET BY MOUTH ONCE A DAY AS NEEDED. (Patient taking differently: Take 500 mg by mouth daily as needed (cold sore).)   Vitamin D, Ergocalciferol, (DRISDOL) 1.25 MG (50000 UNIT) CAPS capsule TAKE 1 CAPSULE BY MOUTH EVERY 7 DAYS   zolpidem (AMBIEN) 5 MG tablet TAKE 1 TABLET BY MOUTH AT BEDTIME     Allergies:   Ativan [lorazepam], Other, Azithromycin, Benadryl [diphenhydramine hcl], Ciprofloxacin hcl, Codeine phosphate, Hydrocodone, Hydrocodone bit-homatrop mbr, Levaquin [levofloxacin in d5w], Meperidine hcl, Molds & smuts, Sulfa antibiotics, Tape, and Ultram [tramadol hcl]   Past Medical History:  Diagnosis Date   ALLERGIC RHINITIS    ANEMIA, CHRONIC    Malabsorption related to bypass hx, B12 and iron deficiency   ANXIETY    Arthritis    ASTHMA    Bariatric surgery status    Bowel perforation ([facility]) 2016   C. difficile diarrhea    Colon perforation ([facility]) 08/2014   following polypetomy during colo, surgical repair   COLONIC POLYPS, ADENOMATOUS, HX OF 2010   DIVERTICULOSIS, COLON    DVT (deep venous thrombosis) ([facility]) 2018   DVT (deep venous thrombosis) (HCC)    GERD  (gastroesophageal reflux disease) 08/23/2015   Graded compression stocking in place    History of blood transfusion    History of migraine    History of rheumatic fever    HSV (herpes simplex virus) infection 08/16/2015   Hyperlipidemia    Hypertension    Denies   Hyponatremia 05/02/2016   Incomplete left bundle branch block (LBBB) 2016   Noted on EKG   INSOMNIA    Lumbosacral disc disease    Chronic pain; History of osteomyelitis 2010 following ESI complication   Obesity    Osteomyelitis (HCC)    Pelvic fracture (HCC)  Pneumonia    Recurrent UTI 08/23/2015   URINARY URGENCY    Ventral hernia without obstruction or gangrene 05/22/2018   VITAMIN B12 DEFICIENCY     Past Surgical History:  Procedure Laterality Date   BACK SURGERY  1994   for ruptured disc   CHOLECYSTECTOMY     COLONOSCOPY     greenfield filter     ileojejunal bypass  1976   for obesity   LAPAROTOMY N/A 09/18/2014   Procedure: EXPLORATORY LAPAROTOMY WITH REPAIR OF CECIAL PERFORATION;  Surgeon: Autumn Messing III, MD;  Location: [facility];  Service: General;  Laterality: N/A;   TONSILLECTOMY     TOTAL KNEE ARTHROPLASTY Right 08/26/2019   Procedure: RIGHT TOTAL KNEE ARTHROPLASTY, LEFT KNEE CORTISONE INJECTION;  Surgeon: Paralee Cancel, MD;  Location: [facility];  Service: Orthopedics;  Laterality: Right;  70 MINS   TOTAL KNEE ARTHROPLASTY Left 01/25/2021   Procedure: TOTAL KNEE ARTHROPLASTY;  Surgeon: Paralee Cancel, MD;  Location: [facility];  Service: Orthopedics;  Laterality: Left;   UPPER GI ENDOSCOPY     VASCULAR SURGERY Right    revascularization right lower extremity     Social History:  The patient  reports that she has never smoked. She has never used smokeless tobacco. She reports that she does not drink alcohol and does not use drugs.   Family History:  The patient's family history includes Cervical cancer in her mother; Cirrhosis in her sister; Kidney disease in her sister; Liver disease in her sister; Stroke in her  mother.    ROS:  Please see the history of present illness. All other systems are reviewed and  Negative to the above problem except as noted.    PHYSICAL EXAM: VS:  BP 110/70   Pulse 89   Ht 5' 5.5" (1.664 m)   Wt 175 lb 9.6 oz (79.7 kg)   SpO2 98%   BMI 28.78 kg/m   GEN:  Overweight 88 yo  in no acute distress  HEENT: normal  Neck: no JVD, carotid bruits Cardiac: RRR  Gr I/VI sytolic murmur base  Triv LE  edema  Respiratory:  clear to auscultation bilaterally, normal work of breathing GI: soft, nontender, nondistended, + BS  No hepatomegaly  MS: no deformity Moving all extremities   Skin: warm and dry, no rash Neuro:  Strength and sensation are intact Psych: euthymic mood, full affect   EKG:  EKG is not ordered today.  Cardiac studies  Echo 11/15/2020 1. DIfficult acoustic windows make evaluation difficult. LVEF is  depressed with hypokinessis of the inferoseptum and Basal inferior  hypokinesis. Compared to echo from 2021, LVEF appears mildly worse. Left  ventricular ejection fraction, by estimation, is   45 to 50%. The left ventricle has mildly decreased function. Left  ventricular diastolic parameters are consistent with Grade I diastolic  dysfunction (impaired relaxation). Elevated left atrial pressure.   2. Right ventricular systolic function is normal. The right ventricular  size is normal.   3. Trivial mitral valve regurgitation.   4. Tricuspid valve regurgitation is mild to moderate.   5. AV is thickened, calcified with mildly restricted motion Peak and mean  gradients through the valve are 17 and 10 mm Hg respectively consistent  with mild AS . The aortic valve is tricuspid. Aortic valve regurgitation  is not visualized.   6. The inferior vena cava is normal in size with greater than 50%  respiratory variability, suggesting right atrial pressure of 3 mmHg.    [facility] 30 day  monitor 07/17/20 1. NSR with sinus brady and sinus tachycardia 2. Frequent PAC's  and rare PVC's 3. Atrial fib with a controlled VR and a RVR (2h 47 minutes total) 4. No prolonged pauses 5. Noise artifact  Lipid Panel    Component Value Date/Time   CHOL 136 08/30/2020 1147   TRIG 172.0 (H) 08/30/2020 1147   HDL 44.60 08/30/2020 1147   CHOLHDL 3 08/30/2020 1147   VLDL 34.4 08/30/2020 1147   LDLCALC 57 08/30/2020 1147   LDLDIRECT 76.0 10/04/2018 1402      Wt Readings from Last 3 Encounters:  09/21/21 175 lb 9.6 oz (79.7 kg)  09/12/21 175 lb 6.4 oz (79.6 kg)  07/28/21 170 lb (77.1 kg)      ASSESSMENT AND PLAN:  1  PAF   Pt had PAF noted on 30 day monitor in April 2022   <1% burden.  Pt asympotmatic.   CHADSVASc score is 5    With no recent falls pt resumed full dose anticoagualtion.     2 HFmrEF    PT had normal echo in past    Last echo showed LVEF  45 to 50%   I have reviewed echo from Aug 2022 There is hypokinesis inferiorrly    Hx DVT   Chronic nonocclusive DVT    Current medicines are reviewed at length with the patient today.  The patient does not have concerns regarding medicines.  Signed, Dorris Carnes, MD  09/21/2021 10:01 PM    [address], [place], [place]  [address] Phone: [phone]; Fax: [phone]

## 2021-09-21 NOTE — Patient Instructions (Signed)
Medication Instructions:   *If you need a refill on your cardiac medications before your next appointment, please call your pharmacy*   Lab Work: CBC and TSH  If you have labs (blood work) drawn today and your tests are completely normal, you will receive your results only by: [place] (if you have MyChart) OR A paper copy in the mail If you have any lab test that is abnormal or we need to change your treatment, we will call you to review the results.   Testing/Procedures:    Follow-Up: At [facility], you and your health needs are our priority.  As part of our continuing mission to provide you with exceptional heart care, we have created designated Provider Care Teams.  These Care Teams include your primary Cardiologist (physician) and Advanced Practice Providers (APPs -  Physician Assistants and Nurse Practitioners) who all work together to provide you with the care you need, when you need it.  We recommend signing up for the patient portal called "MyChart".  Sign up information is provided on this After Visit Summary.  MyChart is used to connect with patients for Virtual Visits (Telemedicine).  Patients are able to view lab/test results, encounter notes, upcoming appointments, etc.  Non-urgent messages can be sent to your provider as well.   To learn more about what you can do with MyChart, go to [email or web address].    Your next appointment:   8 month(s)  The format for your next appointment:   In Person  Provider:   Dorris Carnes, MD     Other Instructions   Important Information About Sugar

## 2021-09-22 ENCOUNTER — Other Ambulatory Visit (HOSPITAL_COMMUNITY): Payer: Self-pay

## 2021-09-22 LAB — CBC
Hematocrit: 34.3 % (ref 34.0–46.6)
Hemoglobin: 11.8 g/dL (ref 11.1–15.9)
MCH: 32.2 pg (ref 26.6–33.0)
MCHC: 34.4 g/dL (ref 31.5–35.7)
MCV: 94 fL (ref 79–97)
Platelets: 289 10*3/uL (ref 150–450)
RBC: 3.66 x10E6/uL — ABNORMAL LOW (ref 3.77–5.28)
RDW: 13 % (ref 11.7–15.4)
WBC: 7.4 10*3/uL (ref 3.4–10.8)

## 2021-09-22 LAB — TSH: TSH: 0.626 u[IU]/mL (ref 0.450–4.500)

## 2021-09-23 ENCOUNTER — Other Ambulatory Visit (HOSPITAL_COMMUNITY): Payer: Self-pay

## 2021-09-28 ENCOUNTER — Encounter (HOSPITAL_BASED_OUTPATIENT_CLINIC_OR_DEPARTMENT_OTHER): Payer: Self-pay | Admitting: Obstetrics & Gynecology

## 2021-09-28 ENCOUNTER — Other Ambulatory Visit (HOSPITAL_COMMUNITY): Payer: Self-pay

## 2021-09-28 ENCOUNTER — Ambulatory Visit (INDEPENDENT_AMBULATORY_CARE_PROVIDER_SITE_OTHER): Payer: Medicare HMO | Admitting: Obstetrics & Gynecology

## 2021-09-28 VITALS — BP 110/70 | HR 87 | Ht 65.5 in | Wt 174.8 lb

## 2021-09-28 DIAGNOSIS — N9089 Other specified noninflammatory disorders of vulva and perineum: Secondary | ICD-10-CM

## 2021-09-28 DIAGNOSIS — Z87440 Personal history of urinary (tract) infections: Secondary | ICD-10-CM | POA: Diagnosis not present

## 2021-09-28 MED ORDER — TERCONAZOLE 0.4 % VA CREA
1.0000 | TOPICAL_CREAM | Freq: Every day | VAGINAL | 0 refills | Status: DC
  Filled 2021-09-28: qty 45, 7d supply, fill #0

## 2021-09-29 LAB — URINE CULTURE: Organism ID, Bacteria: NO GROWTH

## 2021-09-30 NOTE — Progress Notes (Signed)
GYNECOLOGY  VISIT  CC:   Recheck urine culture, vulvar irritation  HPI: 88 y.o. G3P3 Married White or Caucasian female here for recheck after being seen for persistent vulvar irritation.  She does have history of recurrent UTIs and a urine culture was obtained when she was seen on the 19th.  Her urine culture showed MRSA.  She was treated for this.  It was a clean-catch.  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  We discussed that it possibly could be a skin contaminant but considering history of recurrent UTIs, I felt it was necessary to treat.  She denies any urinary complaints today.  She states her vulvar symptoms are much improved.  She feels much better and is very happy she was seen.  Denies vaginal discharge or vaginal bleeding.  Because of the MRSA UTI, I did recommend that we have a cath UA obtained today to ensure we have a sterile urine specimen.  She is comfortable this too.   Past Medical History:  Diagnosis Date   ALLERGIC RHINITIS    ANEMIA, CHRONIC    Malabsorption related to bypass hx, B12 and iron deficiency   ANXIETY    Arthritis    ASTHMA    Bariatric surgery status    Bowel perforation ([facility]) 2016   C. difficile diarrhea    Colon perforation ([facility]) 08/2014   following polypetomy during colo, surgical repair   COLONIC POLYPS, ADENOMATOUS, HX OF 2010   DIVERTICULOSIS, COLON    DVT (deep venous thrombosis) ([facility]) 2018   DVT (deep venous thrombosis) (HCC)    GERD (gastroesophageal reflux disease) 08/23/2015   Graded compression stocking in place    History of blood transfusion    History of migraine    History of rheumatic fever    HSV (herpes simplex virus) infection 08/16/2015   Hyperlipidemia    Hypertension    Denies   Hyponatremia 05/02/2016   Incomplete left bundle branch block (LBBB) 2016   Noted on EKG   INSOMNIA    Lumbosacral disc disease    Chronic pain; History of osteomyelitis 2010 following ESI complication   Obesity     Osteomyelitis (HCC)    Pelvic fracture ([facility]CC)    Pneumonia    Recurrent UTI 08/23/2015   URINARY URGENCY    Ventral hernia without obstruction or gangrene 05/22/2018   VITAMIN B12 DEFICIENCY     MEDS:   Current Outpatient Medications on File Prior to Visit  Medication Sig Dispense Refill   acetaminophen (TYLENOL) 500 MG tablet Take 2 tablets (1,000 mg total) by mouth every 8 (eight) hours. (Patient taking differently: Take 1,000 mg by mouth every 8 (eight) hours as needed for mild pain.) 30 tablet 0   albuterol (PROVENTIL HFA;VENTOLIN HFA) 108 (90 BASE) MCG/ACT inhaler Inhale 1-2 puffs into the lungs every 6 (six) hours as needed for wheezing or shortness of breath.     ALPRAZolam (XANAX) 0.5 MG tablet Take 1 tablet by mouth at bedtime as needed for anxiety. 30 tablet 3   amitriptyline (ELAVIL) 100 MG tablet Take 1 tablet (100 mg total) by mouth daily. 90 tablet 1   amoxicillin (AMOXIL) 500 MG capsule Take 4 capsules by mouth 1 hour prior to dental work 4 capsule 4   apixaban (ELIQUIS) 5 MG TABS tablet Take 1 tablet (5 mg total) by mouth every 12 (twelve) hours. 180 tablet 3   azelastine (OPTIVAR) 0.05 % ophthalmic solution Place 1 drop into both eyes  daily as needed (for dry eyes).     budesonide-formoterol (SYMBICORT) 160-4.5 MCG/ACT inhaler Inhale 2 puffs into the lungs See admin instructions. Inhale 2 puffs into the lungs one to two times a day 1 Inhaler 2   cetirizine (ZYRTEC) 10 MG tablet Take 10 mg by mouth daily.      COVID-19 mRNA Vac-TriS, Pfizer, SUSP injection Inject into the muscle. 0.3 mL 0   cyanocobalamin (,VITAMIN B-12,) 1000 MCG/ML injection INJECT 1CC INTO THE MUSCLE EVERY MONTH (Patient taking differently: Inject 1,000 mcg into the muscle every 30 (thirty) days.) 10 mL 1   dicyclomine (BENTYL) 10 MG capsule Take 1 capsule by mouth 3 times daily as needed for diarrhea and symptoms. 270 capsule 0   dicyclomine (BENTYL) 20 MG tablet Take 1/2 to 1 tablet by mouth 3 times daily as  needed. 90 tablet 3   diphenoxylate-atropine (LOMOTIL) 2.5-0.025 MG tablet Take 1 to 2 tablets by mouth up to 4 times a day 180 tablet 3   fluticasone (FLONASE) 50 MCG/ACT nasal spray Place 1 to 2 sprays in each nostril once a day 16 g 3   gabapentin (NEURONTIN) 300 MG capsule TAKE 1 CAPSULE BY MOUTH AT BEDTIME 90 capsule 1   hydrOXYzine (ATARAX) 25 MG tablet Take 25 mg by mouth 3 (three) times daily as needed for itching.     levocetirizine (XYZAL) 5 MG tablet Take 1 tablet by mouth in the evening 90 tablet 2   magic mouthwash (nystatin, hydrocortisone, diphenhydrAMINE) suspension Swish, gargle and spit 5 to 10 mLs 4 times daily as needed. 160 mL 1   magnesium oxide (MAG-OX) 400 MG tablet Take 400 mg by mouth 2 (two) times daily.     methocarbamol (ROBAXIN) 500 MG tablet Take 1 tablet (500 mg total) by mouth every 6 (six) hours as needed for muscle spasms. 40 tablet 0   metoprolol succinate (TOPROL XL) 25 MG 24 hr tablet Take 1 tablet (25 mg total) by mouth daily. 90 tablet 2   montelukast (SINGULAIR) 10 MG tablet Take 1 tablet by mouth in the evening once daily 90 tablet 2   omeprazole (PRILOSEC) 20 MG capsule Take 1 capsule (20 mg total) by mouth daily. 90 capsule 0   ondansetron (ZOFRAN-ODT) 4 MG disintegrating tablet Take 1 tablet (4 mg total) by mouth 3 (three) times daily as needed for nausea or vomiting. 20 tablet 0   oxyCODONE (OXY IR/ROXICODONE) 5 MG immediate release tablet Take 1-2 tablets (5-10 mg total) by mouth every 8 hours as needed for severe pain (pain score 4-6). 30 tablet 0   phenazopyridine (PYRIDIUM) 100 MG tablet Take 1 tablet (100 mg total) by mouth 3 (three) times daily as needed for pain. 20 tablet 0   polyethylene glycol powder (GLYCOLAX/MIRALAX) 17 GM/SCOOP powder Mix 17 g in 4 oz of water or juice and drink by mouth daily as needed for mild constipation. 238 g 0   potassium chloride SA (KLOR-CON M) 20 MEQ tablet Take 1 tablet (20 mEq total) by mouth daily. 90 tablet 1    Probiotic Product (PROBIOTIC DAILY PO) Take 1 capsule by mouth daily.     triamcinolone (KENALOG) 0.025 % ointment Apply 1 application topically 2 (two) times daily. 30 g 0   Vitamin D, Ergocalciferol, (DRISDOL) 1.25 MG (50000 UNIT) CAPS capsule TAKE 1 CAPSULE BY MOUTH EVERY 7 DAYS 12 capsule 0   zolpidem (AMBIEN) 5 MG tablet TAKE 1 TABLET BY MOUTH AT BEDTIME 90 tablet 1   docusate  sodium (COLACE) 100 MG capsule Take 1 capsule (100 mg total) by mouth 2 (two) times daily. (Patient not taking: Reported on 09/21/2021) 10 capsule 0   [facility] Swish 5 ml around mouth & spit out 4 times a day as needed for mouth pain. (Patient not taking: Reported on 09/21/2021) 240 mL 1   Fe Fum-FA-B Cmp-C-Zn-Mg-Mn-Cu (HEMOCYTE PLUS) 106-1 MG CAPS TAKE 1 CAPSULE BY MOUTH TWO TIMES DAILY (Patient not taking: Reported on 09/28/2021) 60 capsule 1   fluconazole (DIFLUCAN) 150 MG tablet Take 1 tablet (150 mg total) by mouth once a week. (Patient not taking: Reported on 09/21/2021) 2 tablet 1   fluticasone (FLONASE) 50 MCG/ACT nasal spray Place 2 sprays into both nostrils daily. (Patient not taking: Reported on 09/21/2021)     hydrOXYzine (ATARAX) 25 MG tablet Take 1 tablet by mouth three times day. (Patient not taking: Reported on 09/21/2021) 90 tablet 1   montelukast (SINGULAIR) 10 MG tablet Take 1 tablet by mouth in the evening (Patient not taking: Reported on 09/21/2021) 90 tablet 2   valACYclovir (VALTREX) 500 MG tablet TAKE 1 TABLET BY MOUTH ONCE A DAY AS NEEDED. (Patient taking differently: Take 500 mg by mouth daily as needed (cold sore).) 30 tablet 5   No current facility-administered medications on file prior to visit.    ALLERGIES: Ativan [lorazepam], Other, Azithromycin, Benadryl [diphenhydramine hcl], Ciprofloxacin hcl, Codeine phosphate, Hydrocodone, Hydrocodone bit-homatrop mbr, Levaquin [levofloxacin in d5w], Meperidine hcl, Molds & smuts, Sulfa antibiotics, Tape, and Ultram [tramadol hcl]  SH: Married,  non-smoker  Review of Systems  Constitutional: Negative.   Genitourinary: Negative.     PHYSICAL EXAMINATION:    BP 110/70 (BP Location: Right Arm, Patient Position: Sitting, Cuff Size: Normal)   Pulse 87   Ht 5' 5.5" (1.664 m) Comment: Reported  Wt 174 lb 12.8 oz (79.3 kg)   BMI 28.65 kg/m     General appearance: alert, cooperative and appears stated age Lymph:  no inguinal LAD noted  Pelvic: External genitalia:  no lesions, foreskin is completely clear today              Urethra:  normal appearing urethra with no masses, tenderness or lesions              Bartholins and Skenes: normal                 Vagina: normal appearing vagina with normal color and discharge, no lesions  Sterile technique, in and out catheterization of urine sample was obtained by RN Ezekiel Ina.              Chaperone, Ezekiel Ina, CMA, was present for exam.  Assessment/Plan: 1. Vulvar irritation - symptoms have resolved.  Pt desires rx to have on hand and it took seeing several providers and several medications until symptoms were finally treated - terconazole (TERAZOL 7) 0.4 % vaginal cream; Insert 1 applicator vaginally at bedtime for 7 nights.  Apply externally as well nightly.  Dispense: 45 g; Refill: 0  2. History of UTI - cath u/a specimen obtained today - Urine Culture

## 2021-10-03 ENCOUNTER — Other Ambulatory Visit: Payer: Self-pay | Admitting: Family Medicine

## 2021-10-03 ENCOUNTER — Other Ambulatory Visit: Payer: Self-pay | Admitting: Internal Medicine

## 2021-10-03 ENCOUNTER — Other Ambulatory Visit (HOSPITAL_COMMUNITY): Payer: Self-pay

## 2021-10-03 MED ORDER — AMITRIPTYLINE HCL 100 MG PO TABS
100.0000 mg | ORAL_TABLET | Freq: Every day | ORAL | 1 refills | Status: DC
  Filled 2021-10-03: qty 15, 15d supply, fill #0
  Filled 2021-10-03: qty 90, 90d supply, fill #0
  Filled 2021-10-03: qty 75, 75d supply, fill #0
  Filled 2022-01-02: qty 90, 90d supply, fill #1

## 2021-10-03 MED ORDER — OMEPRAZOLE 20 MG PO CPDR
20.0000 mg | DELAYED_RELEASE_CAPSULE | Freq: Every day | ORAL | 0 refills | Status: DC
  Filled 2021-10-03: qty 90, 90d supply, fill #0

## 2021-10-04 ENCOUNTER — Other Ambulatory Visit (HOSPITAL_COMMUNITY): Payer: Self-pay

## 2021-10-04 ENCOUNTER — Ambulatory Visit: Payer: Medicare HMO | Admitting: Gastroenterology

## 2021-10-11 ENCOUNTER — Other Ambulatory Visit (HOSPITAL_COMMUNITY): Payer: Self-pay

## 2021-10-12 ENCOUNTER — Other Ambulatory Visit (HOSPITAL_COMMUNITY): Payer: Self-pay

## 2021-10-19 ENCOUNTER — Other Ambulatory Visit (HOSPITAL_COMMUNITY): Payer: Self-pay

## 2021-10-21 DIAGNOSIS — M5030 Other cervical disc degeneration, unspecified cervical region: Secondary | ICD-10-CM | POA: Diagnosis not present

## 2021-10-21 DIAGNOSIS — S0990XA Unspecified injury of head, initial encounter: Secondary | ICD-10-CM | POA: Diagnosis not present

## 2021-10-21 DIAGNOSIS — W19XXXA Unspecified fall, initial encounter: Secondary | ICD-10-CM | POA: Diagnosis not present

## 2021-10-22 DIAGNOSIS — R918 Other nonspecific abnormal finding of lung field: Secondary | ICD-10-CM | POA: Diagnosis not present

## 2021-10-22 DIAGNOSIS — S12000A Unspecified displaced fracture of first cervical vertebra, initial encounter for closed fracture: Secondary | ICD-10-CM | POA: Diagnosis not present

## 2021-10-22 DIAGNOSIS — S3991XA Unspecified injury of abdomen, initial encounter: Secondary | ICD-10-CM | POA: Diagnosis not present

## 2021-10-22 DIAGNOSIS — Z7901 Long term (current) use of anticoagulants: Secondary | ICD-10-CM | POA: Diagnosis not present

## 2021-10-22 DIAGNOSIS — W19XXXA Unspecified fall, initial encounter: Secondary | ICD-10-CM | POA: Diagnosis not present

## 2021-10-22 DIAGNOSIS — S0990XA Unspecified injury of head, initial encounter: Secondary | ICD-10-CM | POA: Diagnosis not present

## 2021-10-22 DIAGNOSIS — S12100A Unspecified displaced fracture of second cervical vertebra, initial encounter for closed fracture: Secondary | ICD-10-CM | POA: Diagnosis not present

## 2021-10-22 DIAGNOSIS — K439 Ventral hernia without obstruction or gangrene: Secondary | ICD-10-CM | POA: Diagnosis not present

## 2021-10-22 DIAGNOSIS — S299XXA Unspecified injury of thorax, initial encounter: Secondary | ICD-10-CM | POA: Diagnosis not present

## 2021-10-22 DIAGNOSIS — S3993XA Unspecified injury of pelvis, initial encounter: Secondary | ICD-10-CM | POA: Diagnosis not present

## 2021-10-22 DIAGNOSIS — R519 Headache, unspecified: Secondary | ICD-10-CM | POA: Diagnosis not present

## 2021-10-22 DIAGNOSIS — Z885 Allergy status to narcotic agent status: Secondary | ICD-10-CM | POA: Diagnosis not present

## 2021-10-22 DIAGNOSIS — I4891 Unspecified atrial fibrillation: Secondary | ICD-10-CM | POA: Diagnosis not present

## 2021-10-23 ENCOUNTER — Other Ambulatory Visit: Payer: Self-pay

## 2021-10-23 ENCOUNTER — Emergency Department (HOSPITAL_COMMUNITY)
Admission: EM | Admit: 2021-10-23 | Discharge: 2021-10-23 | Disposition: A | Discharge status: Home / Self Care | Payer: Medicare HMO | Attending: Emergency Medicine | Admitting: Emergency Medicine

## 2021-10-23 ENCOUNTER — Encounter (HOSPITAL_COMMUNITY): Payer: Self-pay

## 2021-10-23 DIAGNOSIS — S12191B Other nondisplaced fracture of second cervical vertebra, initial encounter for open fracture: Secondary | ICD-10-CM | POA: Diagnosis not present

## 2021-10-23 DIAGNOSIS — S129XXD Fracture of neck, unspecified, subsequent encounter: Secondary | ICD-10-CM | POA: Diagnosis not present

## 2021-10-23 DIAGNOSIS — Z7901 Long term (current) use of anticoagulants: Secondary | ICD-10-CM | POA: Diagnosis not present

## 2021-10-23 DIAGNOSIS — Z79899 Other long term (current) drug therapy: Secondary | ICD-10-CM | POA: Insufficient documentation

## 2021-10-23 DIAGNOSIS — I10 Essential (primary) hypertension: Secondary | ICD-10-CM | POA: Diagnosis not present

## 2021-10-23 DIAGNOSIS — W109XXD Fall (on) (from) unspecified stairs and steps, subsequent encounter: Secondary | ICD-10-CM | POA: Diagnosis not present

## 2021-10-23 DIAGNOSIS — S199XXD Unspecified injury of neck, subsequent encounter: Secondary | ICD-10-CM | POA: Diagnosis present

## 2021-10-23 LAB — BASIC METABOLIC PANEL
Anion gap: 4 — ABNORMAL LOW (ref 5–15)
BUN: 19 mg/dL (ref 8–23)
CO2: 28 mmol/L (ref 22–32)
Calcium: 8.8 mg/dL — ABNORMAL LOW (ref 8.9–10.3)
Chloride: 101 mmol/L (ref 98–111)
Creatinine, Ser: 0.62 mg/dL (ref 0.44–1.00)
GFR, Estimated: >60 mL/min (ref >60)
Glucose, Bld: 94 mg/dL (ref 70–99)
Potassium: 4.2 mmol/L (ref 3.5–5.1)
Sodium: 133 mmol/L — ABNORMAL LOW (ref 135–145)

## 2021-10-23 LAB — CBC
HCT: 33.6 % — ABNORMAL LOW (ref 36.0–46.0)
Hemoglobin: 10.8 g/dL — ABNORMAL LOW (ref 12.0–15.0)
MCH: 31.2 pg (ref 26.0–34.0)
MCHC: 32.1 g/dL (ref 30.0–36.0)
MCV: 97.1 fL (ref 80.0–100.0)
Platelets: 244 10*3/uL (ref 150–400)
RBC: 3.46 MIL/uL — ABNORMAL LOW (ref 3.87–5.11)
RDW: 14 % (ref 11.5–15.5)
WBC: 7.4 10*3/uL (ref 4.0–10.5)
nRBC: 0 % (ref 0.0–0.2)

## 2021-10-23 MED ORDER — OXYCODONE HCL 5 MG PO TABS: 5.0000 mg | ORAL_TABLET | ORAL | 0 refills | Status: DC | PRN

## 2021-10-23 NOTE — ED Provider Notes (Signed)
[facility] EMERGENCY DEPARTMENT Provider Note   CSN: [id number] Arrival date & time: 10/23/21  0801     History  Chief Complaint  Patient presents with   cervical fracture   Fall    Alexis Olson is a 88 y.o. female.  The history is provided by the patient, medical records and a relative.  Fall    Patient with medical history of hypertension, hyperlipidemia, atrial fibrillation on Eliquis presents today due to neck pain.  She fell Tuesday (6 days ago), she denies hitting her head but is having pain to the right side of her body.  Seen in ED yesterday/earlier this morning, diagnosed with C2 transverse process fracture involving the transverse foramen.  Negative CTA head and neck, chest abd pelvis. Patient was going to be admitted for neurology evaluation but left [place] as family lives here in [place] and actually works at [employer].  Patient denies any paresthesias, lateralized weakness, headache, vision changes, chest pain, shortness of breath, dizziness.  She has an Designer, multimedia but states it is itching and requesting a different Aspen collar.   Home Medications Prior to Admission medications   Medication Sig Start Date End Date Taking? Authorizing Provider  oxyCODONE (ROXICODONE) 5 MG immediate release tablet Take 1 tablet (5 mg total) by mouth every 4 (four) hours as needed for severe pain. 10/23/21  Yes Sherrill Raring, PA-C  acetaminophen (TYLENOL) 500 MG tablet Take 2 tablets (1,000 mg total) by mouth every 8 (eight) hours. Patient taking differently: Take 1,000 mg by mouth every 8 (eight) hours as needed for mild pain. 08/27/19   Irving Copas, PA-C  albuterol (PROVENTIL HFA;VENTOLIN HFA) 108 (90 BASE) MCG/ACT inhaler Inhale 1-2 puffs into the lungs every 6 (six) hours as needed for wheezing or shortness of breath.    [provider]  ALPRAZolam Duanne Moron) 0.5 MG tablet Take 1 tablet by mouth at bedtime as needed for anxiety. 07/25/21    Mosie Lukes, MD  amitriptyline (ELAVIL) 100 MG tablet Take 1 tablet (100 mg total) by mouth daily. 10/03/21   Mosie Lukes, MD  amoxicillin (AMOXIL) 500 MG capsule Take 4 capsules by mouth 1 hour prior to dental work 05/01/21     apixaban (ELIQUIS) 5 MG TABS tablet Take 1 tablet (5 mg total) by mouth every 12 (twelve) hours. 07/13/21   Loel Dubonnet, NP  azelastine (OPTIVAR) 0.05 % ophthalmic solution Place 1 drop into both eyes daily as needed (for dry eyes).    [provider]  budesonide-formoterol (SYMBICORT) 160-4.5 MCG/ACT inhaler Inhale 2 puffs into the lungs See admin instructions. Inhale 2 puffs into the lungs one to two times a day 03/04/18   Saguier, Percell Miller, PA-C  cetirizine (ZYRTEC) 10 MG tablet Take 10 mg by mouth daily.  02/05/14   [provider]  COVID-19 mRNA Vac-TriS, Pfizer, SUSP injection Inject into the muscle. 11/12/20   Carlyle Basques, MD  cyanocobalamin (,VITAMIN B-12,) 1000 MCG/ML injection INJECT 1CC INTO THE MUSCLE EVERY MONTH Patient taking differently: Inject 1,000 mcg into the muscle every 30 (thirty) days. 10/19/20 11/07/21  Gatha Mayer, MD  dicyclomine (BENTYL) 10 MG capsule Take 1 capsule by mouth 3 times daily as needed for diarrhea and symptoms. 03/29/21   Gatha Mayer, MD  dicyclomine (BENTYL) 20 MG tablet Take 1/2 to 1 tablet by mouth 3 times daily as needed. 05/16/21   Gatha Mayer, MD  diphenoxylate-atropine (LOMOTIL) 2.5-0.025 MG tablet Take 1 to  2 tablets by mouth up to 4 times a day 03/29/21   Gatha Mayer, MD  docusate sodium (COLACE) 100 MG capsule Take 1 capsule (100 mg total) by mouth 2 (two) times daily. Patient not taking: Reported on 09/21/2021 02/02/21   Irving Copas, PA-C  [facility] Swish 5 ml around mouth & spit out 4 times a day as needed for mouth pain. Patient not taking: Reported on 09/21/2021 07/07/21   Carollee Herter, Kendrick Fries R, DO  Fe Fum-FA-B Cmp-C-Zn-Mg-Mn-Cu (HEMOCYTE PLUS) 106-1 MG CAPS TAKE 1 CAPSULE BY MOUTH TWO  TIMES DAILY Patient not taking: Reported on 09/28/2021 06/22/21 06/22/22  Gatha Mayer, MD  fluconazole (DIFLUCAN) 150 MG tablet Take 1 tablet (150 mg total) by mouth once a week. Patient not taking: Reported on 09/21/2021 04/04/21   Mosie Lukes, MD  fluticasone [facility]) 50 MCG/ACT nasal spray Place 2 sprays into both nostrils daily. Patient not taking: Reported on 09/21/2021    [provider]  fluticasone (FLONASE) 50 MCG/ACT nasal spray Place 1 to 2 sprays in each nostril once a day 09/06/21     gabapentin (NEURONTIN) 300 MG capsule TAKE 1 CAPSULE BY MOUTH AT BEDTIME 09/08/21 09/08/22  Mosie Lukes, MD  hydrOXYzine (ATARAX) 25 MG tablet Take 25 mg by mouth 3 (three) times daily as needed for itching.    [provider]  hydrOXYzine (ATARAX) 25 MG tablet Take 1 tablet by mouth three times day. Patient not taking: Reported on 09/21/2021 02/22/21     levocetirizine (XYZAL) 5 MG tablet Take 1 tablet by mouth in the evening 05/17/21     magic mouthwash (nystatin, hydrocortisone, diphenhydrAMINE) suspension Swish, gargle and spit 5 to 10 mLs 4 times daily as needed. 02/08/21     magnesium oxide (MAG-OX) 400 MG tablet Take 400 mg by mouth 2 (two) times daily.    [provider]  methocarbamol (ROBAXIN) 500 MG tablet Take 1 tablet (500 mg total) by mouth every 6 (six) hours as needed for muscle spasms. 03/24/21   Irving Copas, PA-C  metoprolol succinate (TOPROL XL) 25 MG 24 hr tablet Take 1 tablet (25 mg total) by mouth daily. 07/05/21   Loel Dubonnet, NP  montelukast (SINGULAIR) 10 MG tablet Take 1 tablet by mouth in the evening Patient not taking: Reported on 09/21/2021 04/13/20   Mosetta Anis, MD  montelukast (SINGULAIR) 10 MG tablet Take 1 tablet by mouth in the evening once daily 04/19/21     omeprazole (PRILOSEC) 20 MG capsule Take 1 capsule by mouth daily. 10/03/21 10/03/22  Gatha Mayer, MD  ondansetron (ZOFRAN-ODT) 4 MG disintegrating tablet Take 1 tablet (4 mg  total) by mouth 3 (three) times daily as needed for nausea or vomiting. 08/27/19   Irving Copas, PA-C  phenazopyridine (PYRIDIUM) 100 MG tablet Take 1 tablet (100 mg total) by mouth 3 (three) times daily as needed for pain. 11/05/20   Mosie Lukes, MD  polyethylene glycol powder (GLYCOLAX/MIRALAX) 17 GM/SCOOP powder Mix 17 g in 4 oz of water or juice and drink by mouth daily as needed for mild constipation. 02/02/21   Irving Copas, PA-C  potassium chloride SA (KLOR-CON M) 20 MEQ tablet Take 1 tablet (20 mEq total) by mouth daily. 04/18/21   Ann Held, DO  Probiotic Product (PROBIOTIC DAILY PO) Take 1 capsule by mouth daily.    [provider]  terconazole (TERAZOL 7) 0.4 % vaginal cream Insert 1 applicator vaginally at bedtime  for 7 nights.  Apply externally as well nightly. 09/28/21   Megan Salon, MD  triamcinolone (KENALOG) 0.025 % ointment Apply 1 application topically 2 (two) times daily. 09/12/21   Megan Salon, MD  valACYclovir (VALTREX) 500 MG tablet TAKE 1 TABLET BY MOUTH ONCE A DAY AS NEEDED. Patient taking differently: Take 500 mg by mouth daily as needed (cold sore). 01/21/20 09/21/21  Mosie Lukes, MD  Vitamin D, Ergocalciferol, (DRISDOL) 1.25 MG (50000 UNIT) CAPS capsule TAKE 1 CAPSULE BY MOUTH EVERY 7 DAYS 08/09/21   Mosie Lukes, MD  zolpidem (AMBIEN) 5 MG tablet TAKE 1 TABLET BY MOUTH AT BEDTIME 09/16/21 03/15/22  Mosie Lukes, MD      Allergies    Ativan [lorazepam], Other, Azithromycin, Benadryl [diphenhydramine hcl], Ciprofloxacin hcl, Codeine phosphate, Hydrocodone, Hydrocodone bit-homatrop mbr, Levaquin [levofloxacin in d5w], Meperidine hcl, Molds & smuts, Sulfa antibiotics, Tape, and Ultram [tramadol hcl]    Review of Systems   Review of Systems  Physical Exam Updated Vital Signs BP 119/67 (BP Location: Right Arm)   Pulse 69   Temp (!) 97.5 F (36.4 C) (Oral)   Resp 15   SpO2 95%  Physical Exam Vitals and nursing note reviewed.  Exam conducted with a chaperone present.  Constitutional:      Appearance: Normal appearance.  HENT:     Head: Normocephalic and atraumatic.  Eyes:     General: No scleral icterus.       Right eye: No discharge.        Left eye: No discharge.     Extraocular Movements: Extraocular movements intact.     Pupils: Pupils are equal, round, and reactive to light.  Neck:     Comments: Patient is in [place] collar Cardiovascular:     Rate and Rhythm: Normal rate and regular rhythm.     Pulses: Normal pulses.     Heart sounds: Normal heart sounds. No murmur heard.    No friction rub. No gallop.  Pulmonary:     Effort: Pulmonary effort is normal. No respiratory distress.     Breath sounds: Normal breath sounds.  Abdominal:     General: Abdomen is flat. Bowel sounds are normal. There is no distension.     Palpations: Abdomen is soft.     Tenderness: There is no abdominal tenderness.  Skin:    General: Skin is warm and dry.     Coloration: Skin is not jaundiced.  Neurological:     Mental Status: She is alert. Mental status is at baseline.     Coordination: Coordination normal.     Comments: Cranial nerves II through XII are grossly intact, grips and equal bilaterally.  Lower extremity strength equal bilaterally.  Normal finger-nose, no pronator drift or dysarthria.     ED Results / Procedures / Treatments   Labs (all labs ordered are listed, but only abnormal results are displayed) Labs Reviewed  BASIC METABOLIC PANEL - Abnormal; Notable for the following components:      Result Value   Sodium 133 (*)    Calcium 8.8 (*)    Anion gap 4 (*)    All other components within normal limits  CBC - Abnormal; Notable for the following components:   RBC 3.46 (*)    Hemoglobin 10.8 (*)    HCT 33.6 (*)    All other components within normal limits    EKG None  Radiology No results found.  Procedures Procedures    Medications Ordered  in ED Medications - No data to display  ED  Course/ Medical Decision Making/ A&P                           Medical Decision Making Amount and/or Complexity of Data Reviewed Labs: ordered.  Risk Prescription drug management.   Patient presents due to left transverse C2 fracture.  She was seen in the emergency department yesterday/early a.m. this morning in a different ED.   On exam patient is neurovascular intact with brisk cap refill.  But no focal deficits, mentating at baseline.  Able to move all extremities at any difficulty.  I reviewed imaging from that visit and lab workup.  CT head notable for left C2 transverse process involving the transverse foramen.  CTA head and neck negative for any vascular involvement.  CT chest and abdomen without any acute processes.  Vital signs stable, blood work stable.    She did have multiple contrast loads yesterday so we will recheck BMP and also check anemia given she is on blood thinners.  Impression - left C2 transverse process involving the transverse foramen.   Repeat blood work shows no AKI or gross electrolyte derangement.  Patient is stable anemia not grossly changed from yesterday.  I consulted with Dr. Christella Noa with neurosurgery.  He advises patient does not actually need to be in an Aspen collar, stable fracture.  Says soft collar is appropriate if patient would like some extra support.  Advises 2-week follow-up with neurosurgery.  I discussed the results with patient, her daughter, granddaughter and son who are in the room.  Follow-up plan discussed as well as return precautions.  At this time patient is stabilized and appropriate for outpatient follow-up with neurosurgery.          Final Clinical Impression(s) / ED Diagnoses Final diagnoses:  Closed fracture of transverse process of cervical vertebra, subsequent encounter    Rx / DC Orders ED Discharge Orders          Ordered    oxyCODONE (ROXICODONE) 5 MG immediate release tablet  Every 4 hours PRN        10/23/21  1104              Sherrill Raring, PA-C 10/23/21 1518    Charlesetta Shanks, MD 11/03/21 2158

## 2021-10-23 NOTE — Discharge Instructions (Addendum)
You are seen today in the emergency department for left transverse cervical fracture.  You can wear a soft cervical collar but you do not need to wear the Aspen collar.  Take the oxycodone as prescribed for pain.  Follow-up with [facility] neurosurgery in 2 weeks for reevaluation, call tomorrow morning to schedule that appointment.  If you develop one-sided weakness, numbness, vision changes, difficulty speaking or new or concerning symptoms return back to ED for further evaluation.

## 2021-10-23 NOTE — ED Triage Notes (Signed)
Pt fell off 5 steps. Landed on R side. Cervical fracture confirmed at another facility. A&O X4, on blood thinner-eliquis.

## 2021-10-24 ENCOUNTER — Other Ambulatory Visit (HOSPITAL_COMMUNITY): Payer: Self-pay

## 2021-10-24 ENCOUNTER — Other Ambulatory Visit: Payer: Self-pay | Admitting: Family Medicine

## 2021-10-24 MED ORDER — POTASSIUM CHLORIDE CRYS ER 20 MEQ PO TBCR
20.0000 meq | EXTENDED_RELEASE_TABLET | Freq: Every day | ORAL | 1 refills | Status: DC
  Filled 2021-10-24: qty 90, 90d supply, fill #0
  Filled 2022-01-23: qty 90, 90d supply, fill #1

## 2021-10-25 ENCOUNTER — Other Ambulatory Visit (HOSPITAL_COMMUNITY): Payer: Self-pay

## 2021-10-25 MED ORDER — HYDROXYZINE HCL 25 MG PO TABS
ORAL_TABLET | ORAL | 3 refills | Status: DC
  Filled 2021-10-25: qty 90, 30d supply, fill #0
  Filled 2021-12-20: qty 90, 30d supply, fill #1

## 2021-10-29 ENCOUNTER — Other Ambulatory Visit (HOSPITAL_COMMUNITY): Payer: Self-pay

## 2021-10-29 ENCOUNTER — Other Ambulatory Visit: Payer: Self-pay | Admitting: Internal Medicine

## 2021-10-31 ENCOUNTER — Other Ambulatory Visit (HOSPITAL_COMMUNITY): Payer: Self-pay

## 2021-10-31 MED ORDER — HEMOCYTE PLUS 106-1 MG PO CAPS
1.0000 | ORAL_CAPSULE | Freq: Two times a day (BID) | ORAL | 1 refills | Status: DC
  Filled 2021-10-31: qty 60, 30d supply, fill #0
  Filled 2021-12-30: qty 60, 30d supply, fill #1

## 2021-11-01 ENCOUNTER — Other Ambulatory Visit (HOSPITAL_COMMUNITY): Payer: Self-pay

## 2021-11-04 ENCOUNTER — Telehealth: Payer: Self-pay | Admitting: *Deleted

## 2021-11-04 NOTE — Telephone Encounter (Signed)
        Patient  visited [facility] ed on 10/23/2021  for fall   Telephone encounter attempt :  1st  A HIPAA compliant voice message was left requesting a return call.  Instructed patient to call back at   Instructed patient to call back at [phone]  at their earliest convenience. .  [place], Population [facility] [phone] [address] , [place] [address] Email : [email or web address]

## 2021-11-07 ENCOUNTER — Other Ambulatory Visit (HOSPITAL_COMMUNITY): Payer: Self-pay

## 2021-11-07 DIAGNOSIS — Z6828 Body mass index (BMI) 28.0-28.9, adult: Secondary | ICD-10-CM | POA: Diagnosis not present

## 2021-11-07 DIAGNOSIS — S12101A Unspecified nondisplaced fracture of second cervical vertebra, initial encounter for closed fracture: Secondary | ICD-10-CM | POA: Diagnosis not present

## 2021-11-07 MED ORDER — OXYCODONE HCL 5 MG PO TABS
ORAL_TABLET | ORAL | 0 refills | Status: DC
  Filled 2021-11-07: qty 60, 15d supply, fill #0

## 2021-11-08 ENCOUNTER — Other Ambulatory Visit (HOSPITAL_COMMUNITY): Payer: Self-pay

## 2021-11-08 ENCOUNTER — Other Ambulatory Visit: Payer: Self-pay | Admitting: Neurosurgery

## 2021-11-08 ENCOUNTER — Other Ambulatory Visit (HOSPITAL_COMMUNITY): Payer: Self-pay | Admitting: Neurosurgery

## 2021-11-08 DIAGNOSIS — S12101A Unspecified nondisplaced fracture of second cervical vertebra, initial encounter for closed fracture: Secondary | ICD-10-CM

## 2021-11-09 ENCOUNTER — Ambulatory Visit (HOSPITAL_COMMUNITY)
Admission: RE | Admit: 2021-11-09 | Discharge: 2021-11-09 | Disposition: A | Discharge status: Home / Self Care | Payer: Medicare HMO | Source: Ambulatory Visit | Attending: Neurosurgery | Admitting: Neurosurgery

## 2021-11-09 ENCOUNTER — Encounter (HOSPITAL_COMMUNITY): Payer: Self-pay

## 2021-11-09 DIAGNOSIS — S12101A Unspecified nondisplaced fracture of second cervical vertebra, initial encounter for closed fracture: Secondary | ICD-10-CM | POA: Insufficient documentation

## 2021-11-09 DIAGNOSIS — M542 Cervicalgia: Secondary | ICD-10-CM | POA: Diagnosis not present

## 2021-11-10 ENCOUNTER — Other Ambulatory Visit (HOSPITAL_COMMUNITY): Payer: Self-pay

## 2021-11-11 ENCOUNTER — Other Ambulatory Visit (HOSPITAL_COMMUNITY): Payer: Self-pay

## 2021-11-14 ENCOUNTER — Other Ambulatory Visit (HOSPITAL_COMMUNITY): Payer: Self-pay

## 2021-11-18 ENCOUNTER — Other Ambulatory Visit: Payer: Self-pay | Admitting: Family Medicine

## 2021-11-20 ENCOUNTER — Other Ambulatory Visit: Payer: Self-pay | Admitting: Family Medicine

## 2021-11-21 ENCOUNTER — Other Ambulatory Visit (HOSPITAL_COMMUNITY): Payer: Self-pay

## 2021-11-21 MED ORDER — VITAMIN D (ERGOCALCIFEROL) 1.25 MG (50000 UNIT) PO CAPS
ORAL_CAPSULE | ORAL | 0 refills | Status: DC
  Filled 2021-11-21: qty 12, 84d supply, fill #0

## 2021-11-21 MED ORDER — ALPRAZOLAM 0.5 MG PO TABS
0.5000 mg | ORAL_TABLET | Freq: Every evening | ORAL | 3 refills | Status: DC | PRN
Start: 2021-11-21 — End: 2022-03-21
  Filled 2021-11-21: qty 30, 30d supply, fill #0
  Filled 2021-12-20: qty 30, 30d supply, fill #1
  Filled 2022-01-17: qty 30, 30d supply, fill #2
  Filled 2022-02-17: qty 30, 30d supply, fill #3

## 2021-11-21 NOTE — Telephone Encounter (Signed)
Requesting: alprazolam 0.'5mg'$   Contract: 08/30/20 UDS: 11/11/20 Last Visit: 07/05/21 Next Visit: 12/20/21 Last Refill: 07/25/21 #30 and 0RF  Please Advise

## 2021-11-30 ENCOUNTER — Other Ambulatory Visit (HOSPITAL_COMMUNITY): Payer: Self-pay

## 2021-12-06 ENCOUNTER — Ambulatory Visit: Payer: Self-pay | Admitting: Licensed Clinical Social Worker

## 2021-12-06 ENCOUNTER — Other Ambulatory Visit (HOSPITAL_COMMUNITY): Payer: Self-pay

## 2021-12-06 NOTE — Patient Instructions (Signed)
   It was a pleasure speaking with you today. Per your request your phone appointment is scheduled 12/08/2021  Casimer Lanius, [place] [facility] [phone]

## 2021-12-06 NOTE — Patient Outreach (Signed)
  Care Coordination   Initial Visit Note   12/06/2021 Name: Alexis Olson MRN: [id number] DOB: 1934/02/24  Alexis Olson is a 88 y.o. year old female who sees Alexis Lukes, MD for primary care. I spoke with  Alexis Olson by phone today.  What matters to the patients health and wellness today?      Goals Addressed             This Visit's Progress    Care Coordination Activities       Care Coordination Interventions: Reviewed Care Coordination Services:phone appointment scheduled             SDOH assessments and interventions completed:  No     Care Coordination Interventions Activated:  Yes  Care Coordination Interventions:  Yes, provided   Follow up plan: Follow up call scheduled for 12/08/21    Encounter Outcome:  Pt. Visit Completed   Alexis Olson, [place] [phone]

## 2021-12-08 ENCOUNTER — Other Ambulatory Visit (HOSPITAL_COMMUNITY): Payer: Self-pay

## 2021-12-08 ENCOUNTER — Ambulatory Visit: Payer: Self-pay | Admitting: Licensed Clinical Social Worker

## 2021-12-08 NOTE — Patient Outreach (Signed)
  Care Coordination   Initial Visit Note   12/08/2021 Name: MONCHEL POLLITT MRN: [id number] DOB: January 29, 1934  Kairie Vangieson Weissinger is a 88 y.o. year old female who sees Mosie Lukes, MD for primary care. I spoke with  Renaldo Fiddler Hargan by phone today.  What matters to the patients health and wellness today?  Unable to keep appointment today.    Goals Addressed             This Visit's Progress    Care Coordination Activities       Care Coordination Interventions: Reviewed Care Coordination Services:phone appointment rescheduled             SDOH assessments and interventions completed:  No    Care Coordination Interventions Activated:  No  Care Coordination Interventions:  No, not indicated   Follow up plan: Follow up call scheduled for 12/12/21    Encounter Outcome:  Pt. Visit Completed   Casimer Lanius, [place] [phone]

## 2021-12-08 NOTE — Patient Instructions (Signed)
    It was a pleasure speaking with you today. Per your request a Care Coordination phone appointment is scheduled 12/12/2021  Casimer Lanius, [place] [phone]

## 2021-12-12 ENCOUNTER — Ambulatory Visit: Payer: Self-pay | Admitting: Licensed Clinical Social Worker

## 2021-12-12 ENCOUNTER — Telehealth: Payer: Self-pay | Admitting: Internal Medicine

## 2021-12-12 NOTE — Patient Outreach (Signed)
  Care Coordination  Initial Visit Note   12/12/2021 Name: Alexis Olson MRN: [id number] DOB: 04/28/1933  Alexis Olson is a 88 y.o. year old female who sees Mosie Lukes, MD for primary care. I spoke with  Alexis Olson by phone today.  What matters to the patients health and wellness today?    Patient reports no concerns or needs from Care Coordination team with health and wellness related to physical or mental heath. .    Goals Addressed             This Visit's Progress    COMPLETED: Care Coordination Activities No follow up Required       Care Coordination Interventions: Reviewed Care Coordination Services:phone appointment rescheduled  Medicare Annual Wellness Visit: completed 07/28/2021 Concerns with obtaining required medications: will reach out to [facility] management team Assessed Social Determinants of Health            SDOH assessments and interventions completed:  Yes  SDOH Interventions Today    Flowsheet Row Most Recent Value  SDOH Interventions   Food Insecurity Interventions Intervention Not Indicated  Housing Interventions Intervention Not Indicated  Transportation Interventions Intervention Not Indicated  Stress Interventions Intervention Not Indicated       Care Coordination Interventions Activated:  Yes  Care Coordination Interventions:  Yes, provided   Follow up plan: No further intervention required.   Encounter Outcome:  Pt. Visit Completed   Alexis Olson, [place] [phone]

## 2021-12-12 NOTE — Telephone Encounter (Signed)
Pt c/o medication issue:  1. Name of Medication:   apixaban (ELIQUIS) 5 MG TABS tablet    2. How are you currently taking this medication (dosage and times per day)?   3. Are you having a reaction (difficulty breathing--STAT)?   4. What is your medication issue? Pt is requesting patient assistance [employer] forms for this medication

## 2021-12-12 NOTE — Patient Instructions (Signed)
Visit Information  Thank you for taking time to visit with me today. Please don't hesitate to contact me if I can be of assistance to you.   Following are the goals we discussed today:   Goals Addressed             This Visit's Progress    COMPLETED: Care Coordination Activities No follow up Required       Care Coordination Interventions: Reviewed Care Coordination Services:phone appointment rescheduled  Medicare Annual Wellness Visit: completed 07/28/2021 Concerns with obtaining required medications: will reach out to [facility] management team Assessed Social Determinants of Health           Please call the care guide team at [phone] if you need to cancel or reschedule your appointment.    The patient verbalized understanding of instructions, educational materials, and care plan provided today and DECLINED offer to receive copy of patient instructions, educational materials, and care plan.   No further follow up required: by [place], [place] [phone]

## 2021-12-12 NOTE — Telephone Encounter (Signed)
I spoke with the pt and she has the forms for her husbands med also and they have been tedious and difficult for them... I gave her the pt assistance number [phone]... she will call and see if this helps her better and I will follow up with her later in the week.

## 2021-12-15 NOTE — Telephone Encounter (Signed)
**Note De-Identified Saxon Barich Obfuscation** We are leaving the pt 2 weeks of Eliquis 5 mg samples in the front office for her to pick up while dropping his BMSPAF application off.

## 2021-12-15 NOTE — Telephone Encounter (Signed)
Pt to drop off the ps assistance forms for her and her husband Goldia Ligman for their Eliquis.

## 2021-12-16 NOTE — Telephone Encounter (Signed)
**Note De-Identified Zeynep Fantroy Obfuscation** The pt left her BMSPA[employer] application for Eliquis asst at the office with documents.  I have completed the providers page with DOD, Dr Hassell Done information as Dr Harrington Challenger is currently out of the office. I have also e-mailed all to Dr Alan Ripper nurse so she can obtain Dr Hassell Done signature, date it, and to fax all to [facility] at the fax number written on the cover letter included.

## 2021-12-17 ENCOUNTER — Other Ambulatory Visit (HOSPITAL_COMMUNITY): Payer: Self-pay

## 2021-12-19 NOTE — Assessment & Plan Note (Signed)
Encouraged good sleep hygiene such as dark, quiet room. No blue/green glowing lights such as computer screens in bedroom. No alcohol or stimulants in evening. Cut down on caffeine as able. Regular exercise is helpful but not just prior to bed time.  Ambien prn 

## 2021-12-19 NOTE — Assessment & Plan Note (Signed)
Rate controlled, tolerating Eliquis 

## 2021-12-19 NOTE — Assessment & Plan Note (Signed)
Well controlled, no changes to meds. Encouraged heart healthy diet such as the DASH diet and exercise as tolerated.  °

## 2021-12-19 NOTE — Assessment & Plan Note (Signed)
Encourage heart healthy diet such as MIND or DASH diet, increase exercise, avoid trans fats, simple carbohydrates and processed foods, consider a krill or fish or flaxseed oil cap daily.  °

## 2021-12-19 NOTE — Assessment & Plan Note (Signed)
Patient encouraged to maintain heart healthy diet, regular exercise, adequate sleep. Consider daily probiotics. Take medications as prescribed. Labs ordered and reviewed. RSV (respiratory syncitial virus) vaccine at pharmacy Covid booster  late September At pharmacy High dose flu shot mid Sept to mid Oct RSV (respiratory syncitial virus) vaccine at pharmacy Covid booster when new version out late September At pharmacy High dose flu shot mid Sept to mid Oct Has aged out of MGM, pap and colonoscopy Dexa 2019, consider repeat, ordered today

## 2021-12-19 NOTE — Assessment & Plan Note (Signed)
hgba1c acceptable, minimize simple carbs. Increase exercise as tolerated.  

## 2021-12-19 NOTE — Assessment & Plan Note (Signed)
Supplement and monitor 

## 2021-12-19 NOTE — Assessment & Plan Note (Signed)
Encouraged to get adequate exercise, calcium and vitamin d intake 

## 2021-12-19 NOTE — Telephone Encounter (Signed)
Forms faxed to [employer]MSPA[employer].  

## 2021-12-20 ENCOUNTER — Ambulatory Visit (INDEPENDENT_AMBULATORY_CARE_PROVIDER_SITE_OTHER): Payer: Medicare HMO | Admitting: Family Medicine

## 2021-12-20 ENCOUNTER — Encounter: Payer: Self-pay | Admitting: Family Medicine

## 2021-12-20 VITALS — BP 118/64 | HR 76 | Temp 97.9°F | Resp 16 | Ht 65.0 in | Wt 174.0 lb

## 2021-12-20 DIAGNOSIS — M109 Gout, unspecified: Secondary | ICD-10-CM | POA: Diagnosis not present

## 2021-12-20 DIAGNOSIS — E785 Hyperlipidemia, unspecified: Secondary | ICD-10-CM | POA: Diagnosis not present

## 2021-12-20 DIAGNOSIS — E538 Deficiency of other specified B group vitamins: Secondary | ICD-10-CM

## 2021-12-20 DIAGNOSIS — I10 Essential (primary) hypertension: Secondary | ICD-10-CM

## 2021-12-20 DIAGNOSIS — R739 Hyperglycemia, unspecified: Secondary | ICD-10-CM

## 2021-12-20 DIAGNOSIS — G4700 Insomnia, unspecified: Secondary | ICD-10-CM | POA: Diagnosis not present

## 2021-12-20 DIAGNOSIS — Z0000 Encounter for general adult medical examination without abnormal findings: Secondary | ICD-10-CM

## 2021-12-20 DIAGNOSIS — R7989 Other specified abnormal findings of blood chemistry: Secondary | ICD-10-CM | POA: Diagnosis not present

## 2021-12-20 DIAGNOSIS — Z23 Encounter for immunization: Secondary | ICD-10-CM | POA: Diagnosis not present

## 2021-12-20 DIAGNOSIS — I4891 Unspecified atrial fibrillation: Secondary | ICD-10-CM | POA: Diagnosis not present

## 2021-12-20 DIAGNOSIS — M810 Age-related osteoporosis without current pathological fracture: Secondary | ICD-10-CM

## 2021-12-20 NOTE — Progress Notes (Signed)
Subjective:   By signing my name below, I, Kellie Simmering, attest that this documentation has been prepared under the direction and in the presence of Mosie Lukes, MD 12/20/2021.     Patient ID: Alexis Olson, female    DOB: Apr 24, 1933, 88 y.o.   MRN: [id number]  No chief complaint on file.  HPI Patient is in today for a comprehensive physical exam and follow up on chronic medical concerns. Her husband is also speaking on her behalf.  She denies having any fever, chills, ear pain, headaches, muscle pain, joint pain, new moles, rash, itching, congestion, sinus pain, sore throat, chest pain, palpitations, wheezing, nausea, vomitting, abdominal pain, diarrhea, constipation, blood in stool, dysuria, urgency, frequency and hematuria.  Allergies: She reports that her allergies worsen in the Spring and Fall.  Diet: Her husband states that she does well at remaining hydrated.   Fall: She fell on 10/16/2021 and was diagnosed with C2 transverse process fracture at the ED.   Family history: She reports no recent changes to her family history.   Immunizations: She has been informed about receiving COVID-19, high-dose Flu, and RSV immunizations. She is interested in receiving the high-dose Flu immunization today.   Left knee pain: She reports that she has been visiting [facility] to manage her left knee pain and it has improved.   Metoprolol: She is currently taking Eliquis and Metoprolol to manage her AFIB.   Tiredness: She complains that she is constantly tired. She denies snoring or waking up with headaches. She reports that her level of fatigue is constant.   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 difficile diarrhea    Colon perforation ([place]) 08/2014   following polypetomy during  colo, surgical repair   COLONIC POLYPS, ADENOMATOUS, HX OF 2010   DIVERTICULOSIS, COLON    DVT (deep venous thrombosis) ([place]) 2018   DVT (deep venous thrombosis) (HCC)    GERD (gastroesophageal reflux disease) 08/23/2015   Graded compression stocking in place    History of blood transfusion    History of migraine    History of rheumatic fever    HSV (herpes simplex virus) infection 08/16/2015   Hyperlipidemia    Hypertension    Denies   Hyponatremia 05/02/2016   Incomplete left bundle branch block (LBBB) 2016   Noted on EKG   INSOMNIA    Lumbosacral disc disease    Chronic pain; History of osteomyelitis 2010 following ESI complication   Obesity    Osteomyelitis (HCC)    Pelvic fracture (HCC)    Pneumonia    Recurrent UTI 08/23/2015   URINARY URGENCY    Ventral hernia without obstruction or gangrene 05/22/2018   VITAMIN B12 DEFICIENCY    Past Surgical History:  Procedure Laterality Date   BACK SURGERY  1994   for ruptured disc   CHOLECYSTECTOMY     COLONOSCOPY     greenfield filter     ileojejunal bypass  1976   for obesity   LAPAROTOMY N/A 09/18/2014   Procedure: EXPLORATORY LAPAROTOMY WITH REPAIR OF CECIAL PERFORATION;  Surgeon: Autumn Messing III, MD;  Location: [facility];  Service: General;  Laterality: N/A;   TONSILLECTOMY     TOTAL KNEE ARTHROPLASTY Right 08/26/2019  Procedure: RIGHT TOTAL KNEE ARTHROPLASTY, LEFT KNEE CORTISONE INJECTION;  Surgeon: Paralee Cancel, MD;  Location: [facility];  Service: Orthopedics;  Laterality: Right;  70 MINS   TOTAL KNEE ARTHROPLASTY Left 01/25/2021   Procedure: TOTAL KNEE ARTHROPLASTY;  Surgeon: Paralee Cancel, MD;  Location: [facility];  Service: Orthopedics;  Laterality: Left;   UPPER GI ENDOSCOPY     VASCULAR SURGERY Right    revascularization right lower extremity   Family History  Problem Relation Age of Onset   Cervical cancer Mother    Stroke Mother    Liver disease Sister    Kidney disease Sister    Cirrhosis Sister    Colon cancer Neg Hx     Esophageal cancer Neg Hx    Rectal cancer Neg Hx    Stomach cancer Neg Hx    Social History   Socioeconomic History   Marital status: Married    Spouse name: Not on file   Number of children: 3   Years of education: Not on file   Highest education level: Not on file  Occupational History   Occupation: retired Programmer, multimedia: RETIRED  Tobacco Use   Smoking status: Never   Smokeless tobacco: Never  Vaping Use   Vaping Use: Never used  Substance and Sexual Activity   Alcohol use: No    Alcohol/week: 0.0 standard drinks of alcohol   Drug use: No   Sexual activity: Never    Partners: Male  Other Topics Concern   Not on file  Social History Narrative   Lives with spouse, Indep ADLs   Supportive family nearby   No dietary restrictions   Social Determinants of Health   Financial Resource Strain: Low Risk  (07/28/2021)   Overall Financial Resource Strain (CARDIA)    Difficulty of Paying Living Expenses: Not hard at all  Food Insecurity: No Food Insecurity (12/12/2021)   Hunger Vital Sign    Worried About Running Out of Food in the Last Year: Never true    Ran Out of Food in the Last Year: Never true  Transportation Needs: No Transportation Needs (12/12/2021)   PRAPARE - Hydrologist (Medical): No    Lack of Transportation (Non-Medical): No  Physical Activity: Insufficiently Active (07/28/2021)   Exercise Vital Sign    Days of Exercise per Week: 3 days    Minutes of Exercise per Session: 30 min  Stress: No Stress Concern Present (12/12/2021)   [place]    Feeling of Stress : Not at all  Social Connections: [place] (07/28/2021)   Social Connection and Isolation Panel [NHANES]    Frequency of Communication with Friends and Family: More than three times a week    Frequency of Social Gatherings with Friends and Family: More than three times a week    Attends Religious  Services: More than 4 times per year    Active Member of [employer] or Organizations: Yes    Attends Music therapist: More than 4 times per year    Marital Status: Married  Human resources officer Violence: Not At Risk (07/28/2021)   Humiliation, Afraid, Rape, and Kick questionnaire    Fear of Current or Ex-Partner: No    Emotionally Abused: No    Physically Abused: No    Sexually Abused: No   Outpatient Medications Prior to Visit  Medication Sig Dispense Refill   acetaminophen (TYLENOL) 500 MG tablet Take 2 tablets (1,000  mg total) by mouth every 8 (eight) hours. (Patient taking differently: Take 1,000 mg by mouth every 8 (eight) hours as needed for mild pain.) 30 tablet 0   albuterol (PROVENTIL HFA;VENTOLIN HFA) 108 (90 BASE) MCG/ACT inhaler Inhale 1-2 puffs into the lungs every 6 (six) hours as needed for wheezing or shortness of breath.     ALPRAZolam (XANAX) 0.5 MG tablet Take 1 tablet by mouth at bedtime as needed for anxiety. 30 tablet 3   amitriptyline (ELAVIL) 100 MG tablet Take 1 tablet (100 mg total) by mouth daily. 90 tablet 1   amoxicillin (AMOXIL) 500 MG capsule Take 4 capsules by mouth 1 hour prior to dental work 4 capsule 4   apixaban (ELIQUIS) 5 MG TABS tablet Take 1 tablet (5 mg total) by mouth every 12 (twelve) hours. 180 tablet 3   azelastine (OPTIVAR) 0.05 % ophthalmic solution Place 1 drop into both eyes daily as needed (for dry eyes).     budesonide-formoterol (SYMBICORT) 160-4.5 MCG/ACT inhaler Inhale 2 puffs into the lungs See admin instructions. Inhale 2 puffs into the lungs one to two times a day 1 Inhaler 2   cetirizine (ZYRTEC) 10 MG tablet Take 10 mg by mouth daily.      COVID-19 mRNA Vac-TriS, Pfizer, SUSP injection Inject into the muscle. 0.3 mL 0   cyanocobalamin (,VITAMIN B-12,) 1000 MCG/ML injection INJECT 1CC INTO THE MUSCLE EVERY MONTH (Patient taking differently: Inject 1,000 mcg into the muscle every 30 (thirty) days.) 10 mL 1   dicyclomine (BENTYL) 10  MG capsule Take 1 capsule by mouth 3 times daily as needed for diarrhea and symptoms. 270 capsule 0   dicyclomine (BENTYL) 20 MG tablet Take 1/2 to 1 tablet by mouth 3 times daily as needed. 90 tablet 3   diphenoxylate-atropine (LOMOTIL) 2.5-0.025 MG tablet Take 1 to 2 tablets by mouth up to 4 times a day 180 tablet 3   docusate sodium (COLACE) 100 MG capsule Take 1 capsule (100 mg total) by mouth 2 (two) times daily. (Patient not taking: Reported on 09/21/2021) 10 capsule 0   Fe Fum-FA-B Cmp-C-Zn-Mg-Mn-Cu (HEMOCYTE PLUS) 106-1 MG CAPS TAKE 1 CAPSULE BY MOUTH TWO TIMES DAILY 60 capsule 1   fluticasone (FLONASE) 50 MCG/ACT nasal spray Place 1 to 2 sprays in each nostril once a day 16 g 3   gabapentin (NEURONTIN) 300 MG capsule TAKE 1 CAPSULE BY MOUTH AT BEDTIME 90 capsule 1   hydrOXYzine (ATARAX) 25 MG tablet Take 25 mg by mouth 3 (three) times daily as needed for itching.     hydrOXYzine (ATARAX) 25 MG tablet Take 1 tablet by mouth three times a day 90 tablet 3   levocetirizine (XYZAL) 5 MG tablet Take 1 tablet by mouth in the evening 90 tablet 2   magic mouthwash (nystatin, hydrocortisone, diphenhydrAMINE) suspension Swish, gargle and spit 5 to 10 mLs 4 times daily as needed. 160 mL 1   magnesium oxide (MAG-OX) 400 MG tablet Take 400 mg by mouth 2 (two) times daily.     methocarbamol (ROBAXIN) 500 MG tablet Take 1 tablet (500 mg total) by mouth every 6 (six) hours as needed for muscle spasms. 40 tablet 0   metoprolol succinate (TOPROL XL) 25 MG 24 hr tablet Take 1 tablet (25 mg total) by mouth daily. 90 tablet 2   montelukast (SINGULAIR) 10 MG tablet Take 1 tablet by mouth in the evening (Patient not taking: Reported on 09/21/2021) 90 tablet 2   montelukast (SINGULAIR) 10 MG tablet  Take 1 tablet by mouth in the evening once daily 90 tablet 2   omeprazole (PRILOSEC) 20 MG capsule Take 1 capsule by mouth daily. 90 capsule 0   ondansetron (ZOFRAN-ODT) 4 MG disintegrating tablet Take 1 tablet (4 mg  total) by mouth 3 (three) times daily as needed for nausea or vomiting. 20 tablet 0   oxyCODONE (OXY IR/ROXICODONE) 5 MG immediate release tablet Take 1 tablet by mouth every 6 hours as needed 60 tablet 0   oxyCODONE (ROXICODONE) 5 MG immediate release tablet Take 1 tablet (5 mg total) by mouth every 4 (four) hours as needed for severe pain. 30 tablet 0   phenazopyridine (PYRIDIUM) 100 MG tablet Take 1 tablet (100 mg total) by mouth 3 (three) times daily as needed for pain. 20 tablet 0   polyethylene glycol powder (GLYCOLAX/MIRALAX) 17 GM/SCOOP powder Mix 17 g in 4 oz of water or juice and drink by mouth daily as needed for mild constipation. 238 g 0   potassium chloride SA (KLOR-CON M) 20 MEQ tablet Take 1 tablet (20 mEq total) by mouth daily. 90 tablet 1   Probiotic Product (PROBIOTIC DAILY PO) Take 1 capsule by mouth daily.     terconazole (TERAZOL 7) 0.4 % vaginal cream Insert 1 applicator vaginally at bedtime for 7 nights.  Apply externally as well nightly. 45 g 0   triamcinolone (KENALOG) 0.025 % ointment Apply 1 application topically 2 (two) times daily. 30 g 0   Vitamin D, Ergocalciferol, (DRISDOL) 1.25 MG (50000 UNIT) CAPS capsule TAKE 1 CAPSULE BY MOUTH EVERY 7 DAYS 12 capsule 0   zolpidem (AMBIEN) 5 MG tablet TAKE 1 TABLET BY MOUTH AT BEDTIME 90 tablet 1   D[facility] Swish 5 ml around mouth & spit out 4 times a day as needed for mouth pain. (Patient not taking: Reported on 09/21/2021) 240 mL 1   fluconazole (DIFLUCAN) 150 MG tablet Take 1 tablet (150 mg total) by mouth once a week. (Patient not taking: Reported on 09/21/2021) 2 tablet 1   fluticasone (FLONASE) 50 MCG/ACT nasal spray Place 2 sprays into both nostrils daily. (Patient not taking: Reported on 09/21/2021)     valACYclovir (VALTREX) 500 MG tablet TAKE 1 TABLET BY MOUTH ONCE A DAY AS NEEDED. (Patient taking differently: Take 500 mg by mouth daily as needed (cold sore).) 30 tablet 5   No facility-administered medications prior to visit.    Allergies  Allergen Reactions   Ativan [Lorazepam] Anaphylaxis and Other (See Comments)    Deathly allergic; "resp rate dropped dropped to 6"   Other Anaphylaxis    Red peppers--choking, also   Azithromycin Diarrhea   Benadryl [Diphenhydramine Hcl] Other (See Comments)    Paradoxical response   Ciprofloxacin Hcl Other (See Comments)    Causes yeast infection and refuses to take   Codeine Phosphate Nausea Only    Can tolerate in limited amounts   Hydrocodone Itching and Nausea And Vomiting   Hydrocodone Bit-Homatrop Mbr Itching and Nausea And Vomiting   Levaquin [Levofloxacin In D5w] Other (See Comments)    Muscle soreness   Meperidine Hcl Other (See Comments)    Reaction ??   Molds & Smuts Other (See Comments)    unsure   Sulfa Antibiotics Other (See Comments)    Joint pain   Tape Other (See Comments)    No Coban wrap (per the patient)   Ultram [Tramadol Hcl] Other (See Comments)    "Can't move joints"   Review of Systems  Constitutional:  Negative for chills and fever.  HENT:  Negative for congestion, ear pain, sinus pain and sore throat.   Respiratory:  Negative for cough, shortness of breath and wheezing.   Cardiovascular:  Negative for chest pain and palpitations.  Gastrointestinal:  Negative for abdominal pain, blood in stool, constipation, diarrhea, nausea and vomiting.  Genitourinary:  Negative for dysuria, frequency, hematuria and urgency.  Musculoskeletal:  Negative for joint pain and myalgias.  Skin:  Negative for itching and rash.       (-) New moles.  Neurological:  Negative for headaches.      Objective:    Physical Exam Constitutional:      General: She is not in acute distress.    Appearance: Normal appearance. She is not ill-appearing.  HENT:     Head: Normocephalic and atraumatic.     Right Ear: Tympanic membrane, ear canal and external ear normal.     Left Ear: Tympanic membrane, ear canal and external ear normal.     Mouth/Throat:     Mouth:  Mucous membranes are moist.     Pharynx: Oropharynx is clear.  Eyes:     Extraocular Movements: Extraocular movements intact.     Right eye: No nystagmus.     Left eye: No nystagmus.     Pupils: Pupils are equal, round, and reactive to light.  Neck:     Vascular: No carotid bruit.  Cardiovascular:     Rate and Rhythm: Normal rate and regular rhythm.     Pulses: Normal pulses.     Heart sounds: Normal heart sounds. No murmur heard.    No gallop.  Pulmonary:     Effort: Pulmonary effort is normal. No respiratory distress.     Breath sounds: Normal breath sounds. No wheezing or rales.  Abdominal:     General: Bowel sounds are normal.     Tenderness: There is no abdominal tenderness.  Musculoskeletal:     Comments: Muscle strength 5/5 on upper and lower extremities.   Lymphadenopathy:     Cervical: No cervical adenopathy.  Skin:    General: Skin is warm and dry.  Neurological:     Mental Status: She is alert and oriented to person, place, and time.     Sensory: Sensation is intact.     Motor: Motor function is intact.     Coordination: Coordination is intact.     Deep Tendon Reflexes:     Reflex Scores:      Patellar reflexes are 1+ on the right side and 1+ on the left side. Psychiatric:        Mood and Affect: Mood normal.        Behavior: Behavior normal.        Judgment: Judgment normal.    BP 118/64 (BP Location: Right Arm, Patient Position: Sitting, Cuff Size: Normal)   Pulse 76   Temp 97.9 F (36.6 C) (Oral)   Resp 16   Ht _0  (1.651 m)   Wt 174 lb (78.9 kg)   SpO2 98%   BMI 28.96 kg/m  Wt Readings from Last 3 Encounters:  12/20/21 174 lb (78.9 kg)  09/28/21 174 lb 12.8 oz (79.3 kg)  09/21/21 175 lb 9.6 oz (79.7 kg)   Diabetic Foot Exam - Simple   No data filed    Lab Results  Component Value Date   WBC 7.4 10/23/2021   HGB 10.8 (L) 10/23/2021   HCT 33.6 (L) 10/23/2021   PLT 244 10/23/2021  GLUCOSE 94 10/23/2021   CHOL 136 08/30/2020   TRIG  172.0 (H) 08/30/2020   HDL 44.60 08/30/2020   LDLDIRECT 76.0 10/04/2018   LDLCALC 57 08/30/2020   ALT 14 06/16/2021   AST 16 06/16/2021   NA 133 (L) 10/23/2021   K 4.2 10/23/2021   CL 101 10/23/2021   CREATININE 0.62 10/23/2021   BUN 19 10/23/2021   CO2 28 10/23/2021   TSH 0.626 09/21/2021   INR 1.1 07/11/2020   HGBA1C 5.3 08/30/2020   Lab Results  Component Value Date   TSH 0.626 09/21/2021   Lab Results  Component Value Date   WBC 7.4 10/23/2021   HGB 10.8 (L) 10/23/2021   HCT 33.6 (L) 10/23/2021   MCV 97.1 10/23/2021   PLT 244 10/23/2021   Lab Results  Component Value Date   NA 133 (L) 10/23/2021   K 4.2 10/23/2021   CO2 28 10/23/2021   GLUCOSE 94 10/23/2021   BUN 19 10/23/2021   CREATININE 0.62 10/23/2021   BILITOT 0.3 06/16/2021   ALKPHOS 62 06/16/2021   AST 16 06/16/2021   ALT 14 06/16/2021   PROT 7.1 06/16/2021   ALBUMIN 4.1 06/16/2021   CALCIUM 8.8 (L) 10/23/2021   ANIONGAP 4 (L) 10/23/2021   EGFR 91 12/03/2020   GFR 82.45 06/16/2021   Lab Results  Component Value Date   CHOL 136 08/30/2020   Lab Results  Component Value Date   HDL 44.60 08/30/2020   Lab Results  Component Value Date   LDLCALC 57 08/30/2020   Lab Results  Component Value Date   TRIG 172.0 (H) 08/30/2020   Lab Results  Component Value Date   CHOLHDL 3 08/30/2020   Lab Results  Component Value Date   HGBA1C 5.3 08/30/2020     Colonoscopy: Last completed on 09/18/2014.  - Sessile polyp was found at the cecum; polypectomy was performed piacemeal using snare cautery; -Multiple biopsies were performed using cold forceps to r/o microscpic colitis -Mild diverticulosis was noted in the descending colon  Dexa: Last completed on 03/07/2018. The BMD measured at Forearm Radius 33% is 0.665 g/cm2 with a T-score of -2.4. This patient is considered osteopenic according to [facility]) criteria. Lumbar spine was not utilized due to advanced degenerative changes.  The scan quality was good.  Mammogram: Last completed on 12/31/2014. No mammographic evidence of malignancy.   Assessment & Plan:   Problem List Items Addressed This Visit     Essential hypertension (Chronic)    Well controlled, no changes to meds. Encouraged heart healthy diet such as the DASH diet and exercise as tolerated.       Relevant Orders   CBC   Comprehensive metabolic panel   TSH   Vitamin B 12 deficiency   Relevant Orders   Vitamin B12   INSOMNIA    Encouraged good sleep hygiene such as dark, quiet room. No blue/green glowing lights such as computer screens in bedroom. No alcohol or stimulants in evening. Cut down on caffeine as able. Regular exercise is helpful but not just prior to bed time. Ambien prn      Osteoporosis    Encouraged to get adequate exercise, calcium and vitamin d intake      Relevant Orders   DG Bone Density   Hyperlipidemia, mild    Encourage heart healthy diet such as MIND or DASH diet, increase exercise, avoid trans fats, simple carbohydrates and processed foods, consider a krill or fish or flaxseed oil cap  daily.       Relevant Orders   Lipid panel   Preventative health care    Patient encouraged to maintain heart healthy diet, regular exercise, adequate sleep. Consider daily probiotics. Take medications as prescribed. Labs ordered and reviewed. RSV (respiratory syncitial virus) vaccine at pharmacy Covid booster  late September At pharmacy High dose flu shot mid Sept to mid Oct RSV (respiratory syncitial virus) vaccine at pharmacy Covid booster when new version out late September At pharmacy High dose flu shot mid Sept to mid Oct Has aged out of MGM, pap and colonoscopy Dexa 2019, consider repeat, ordered today      Hypomagnesemia    Supplement and monitor      Relevant Orders   Magnesium   Hyperglycemia    hgba1c acceptable, minimize simple carbs. Increase exercise as tolerated.      Gout   Relevant Orders   Uric acid    Atrial fibrillation (HCC)    Rate controlled, tolerating Eliquis      Other Visit Diagnoses     Low serum calcium    -  Primary   Relevant Orders   VITAMIN D 25 Hydroxy (Vit-D Deficiency, Fractures)   Need for influenza vaccination       Relevant Orders   Flu Vaccine QUAD High Dose(Fluad) (Completed)      No orders of the defined types were placed in this encounter.  I, Penni Homans, MD, personally preformed the services described in this documentation.  All medical record entries made by the scribe were at my direction and in my presence.  I have reviewed the chart and discharge instructions (if applicable) and agree that the record reflects my personal performance and is accurate and complete. 12/20/2021  I,Mohammed Iqbal,acting as a scribe for Penni Homans, MD.,have documented all relevant documentation on the behalf of Penni Homans, MD,as directed by  Penni Homans, MD while in the presence of Penni Homans, MD.  Penni Homans, MD

## 2021-12-20 NOTE — Patient Instructions (Signed)
Preventive Care 65 Years and Older, Female Preventive care refers to lifestyle choices and visits with your health care provider that can promote health and wellness. Preventive care visits are also called wellness exams. What can I expect for my preventive care visit? Counseling Your health care provider may ask you questions about your: Medical history, including: Past medical problems. Family medical history. Pregnancy and menstrual history. History of falls. Current health, including: Memory and ability to understand (cognition). Emotional well-being. Home life and relationship well-being. Sexual activity and sexual health. Lifestyle, including: Alcohol, nicotine or tobacco, and drug use. Access to firearms. Diet, exercise, and sleep habits. Work and work environment. Sunscreen use. Safety issues such as seatbelt and bike helmet use. Physical exam Your health care provider will check your: Height and weight. These may be used to calculate your BMI (body mass index). BMI is a measurement that tells if you are at a healthy weight. Waist circumference. This measures the distance around your waistline. This measurement also tells if you are at a healthy weight and may help predict your risk of certain diseases, such as type 2 diabetes and high blood pressure. Heart rate and blood pressure. Body temperature. Skin for abnormal spots. What immunizations do I need?  Vaccines are usually given at various ages, according to a schedule. Your health care provider will recommend vaccines for you based on your age, medical history, and lifestyle or other factors, such as travel or where you work. What tests do I need? Screening Your health care provider may recommend screening tests for certain conditions. This may include: Lipid and cholesterol levels. Hepatitis C test. Hepatitis B test. HIV (human immunodeficiency virus) test. STI (sexually transmitted infection) testing, if you are at  risk. Lung cancer screening. Colorectal cancer screening. Diabetes screening. This is done by checking your blood sugar (glucose) after you have not eaten for a while (fasting). Mammogram. Talk with your health care provider about how often you should have regular mammograms. BRCA-related cancer screening. This may be done if you have a family history of breast, ovarian, tubal, or peritoneal cancers. Bone density scan. This is done to screen for osteoporosis. Talk with your health care provider about your test results, treatment options, and if necessary, the need for more tests. Follow these instructions at home: Eating and drinking  Eat a diet that includes fresh fruits and vegetables, whole grains, lean protein, and low-fat dairy products. Limit your intake of foods with high amounts of sugar, saturated fats, and salt. Take vitamin and mineral supplements as recommended by your health care provider. Do not drink alcohol if your health care provider tells you not to drink. If you drink alcohol: Limit how much you have to 0-1 drink a day. Know how much alcohol is in your drink. In the [place] one drink equals one 12 oz bottle of beer (355 mL), one 5 oz glass of wine (148 mL), or one 1 oz glass of hard liquor (44 mL). Lifestyle Brush your teeth every morning and night with fluoride toothpaste. Floss one time each day. Exercise for at least 30 minutes 5 or more days each week. Do not use any products that contain nicotine or tobacco. These products include cigarettes, chewing tobacco, and vaping devices, such as e-cigarettes. If you need help quitting, ask your health care provider. Do not use drugs. If you are sexually active, practice safe sex. Use a condom or other form of protection in order to prevent STIs. Take aspirin only as told by   your health care provider. Make sure that you understand how much to take and what form to take. Work with your health care provider to find out whether it  is safe and beneficial for you to take aspirin daily. Ask your health care provider if you need to take a cholesterol-lowering medicine (statin). Find healthy ways to manage stress, such as: Meditation, yoga, or listening to music. Journaling. Talking to a trusted person. Spending time with friends and family. Minimize exposure to UV radiation to reduce your risk of skin cancer. Safety Always wear your seat belt while driving or riding in a vehicle. Do not drive: If you have been drinking alcohol. Do not ride with someone who has been drinking. When you are tired or distracted. While texting. If you have been using any mind-altering substances or drugs. Wear a helmet and other protective equipment during sports activities. If you have firearms in your house, make sure you follow all gun safety procedures. What's next? Visit your health care provider once a year for an annual wellness visit. Ask your health care provider how often you should have your eyes and teeth checked. Stay up to date on all vaccines. This information is not intended to replace advice given to you by your health care provider. Make sure you discuss any questions you have with your health care provider. Document Revised: 09/08/2020 Document Reviewed: 09/08/2020 Elsevier Patient Education  [employer].

## 2021-12-21 ENCOUNTER — Other Ambulatory Visit (HOSPITAL_COMMUNITY): Payer: Self-pay

## 2021-12-21 LAB — COMPREHENSIVE METABOLIC PANEL
ALT: 13 U/L (ref 0–35)
AST: 14 U/L (ref 0–37)
Albumin: 4.2 g/dL (ref 3.5–5.2)
Alkaline Phosphatase: 67 U/L (ref 39–117)
BUN: 14 mg/dL (ref 6–23)
CO2: 28 mEq/L (ref 19–32)
Calcium: 9.1 mg/dL (ref 8.4–10.5)
Chloride: 97 mEq/L (ref 96–112)
Creatinine, Ser: 0.56 mg/dL (ref 0.40–1.20)
GFR: 82.51 mL/min (ref >60.00)
Glucose, Bld: 77 mg/dL (ref 70–99)
Potassium: 3.6 mEq/L (ref 3.5–5.1)
Sodium: 132 mEq/L — ABNORMAL LOW (ref 135–145)
Total Bilirubin: 0.3 mg/dL (ref 0.2–1.2)
Total Protein: 7.2 g/dL (ref 6.0–8.3)

## 2021-12-21 LAB — CBC
HCT: 34.9 % — ABNORMAL LOW (ref 36.0–46.0)
Hemoglobin: 11.8 g/dL — ABNORMAL LOW (ref 12.0–15.0)
MCHC: 33.9 g/dL (ref 30.0–36.0)
MCV: 93.4 fl (ref 78.0–100.0)
Platelets: 293 10*3/uL (ref 150.0–400.0)
RBC: 3.74 Mil/uL — ABNORMAL LOW (ref 3.87–5.11)
RDW: 14.9 % (ref 11.5–15.5)
WBC: 9 10*3/uL (ref 4.0–10.5)

## 2021-12-21 LAB — LIPID PANEL
Cholesterol: 167 mg/dL (ref 0–200)
HDL: 57.8 mg/dL (ref >39.00)
LDL Cholesterol: 77 mg/dL (ref 0–99)
NonHDL: 109.07
Total CHOL/HDL Ratio: 3
Triglycerides: 161 mg/dL — ABNORMAL HIGH (ref 0.0–149.0)
VLDL: 32.2 mg/dL (ref 0.0–40.0)

## 2021-12-21 LAB — URIC ACID: Uric Acid, Serum: 2.6 mg/dL (ref 2.4–7.0)

## 2021-12-21 LAB — VITAMIN B12: Vitamin B-12: 258 pg/mL (ref 211–911)

## 2021-12-21 LAB — VITAMIN D 25 HYDROXY (VIT D DEFICIENCY, FRACTURES): VITD: 27.09 ng/mL — ABNORMAL LOW (ref 30.00–100.00)

## 2021-12-21 LAB — MAGNESIUM: Magnesium: 1.8 mg/dL (ref 1.5–2.5)

## 2021-12-21 LAB — TSH: TSH: 0.94 u[IU]/mL (ref 0.35–5.50)

## 2021-12-21 MED ORDER — AMOXICILLIN 500 MG PO CAPS
ORAL_CAPSULE | ORAL | 0 refills | Status: DC
  Filled 2021-12-21: qty 21, 7d supply, fill #0

## 2021-12-22 ENCOUNTER — Other Ambulatory Visit (HOSPITAL_COMMUNITY): Payer: Self-pay

## 2021-12-22 MED ORDER — VITAMIN D (ERGOCALCIFEROL) 1.25 MG (50000 UNIT) PO CAPS
50000.0000 [IU] | ORAL_CAPSULE | ORAL | 4 refills | Status: DC
  Filled 2021-12-22 – 2022-02-20 (×2): qty 4, 28d supply, fill #0

## 2021-12-22 NOTE — Addendum Note (Signed)
Addended by: Jeronimo Greaves on: 12/22/2021 09:16 AM   Modules accepted: Orders

## 2021-12-27 NOTE — Telephone Encounter (Signed)
**Note De-Identified Alexis Olson Obfuscation** Letter received Alexis Olson fax from [facility] stating that they have approved the pt for Eliquis assistance until 03/26/2022. [id number]  The letter states that they have notified the pt of this approval as well.

## 2021-12-28 ENCOUNTER — Telehealth: Payer: Self-pay | Admitting: Family Medicine

## 2021-12-28 NOTE — Telephone Encounter (Signed)
Pt would like her labs sent to [facility] instead, as it is closer.

## 2021-12-29 ENCOUNTER — Other Ambulatory Visit (HOSPITAL_COMMUNITY): Payer: Self-pay

## 2021-12-30 ENCOUNTER — Other Ambulatory Visit: Payer: Self-pay

## 2021-12-30 ENCOUNTER — Other Ambulatory Visit (HOSPITAL_COMMUNITY): Payer: Self-pay

## 2021-12-30 DIAGNOSIS — I10 Essential (primary) hypertension: Secondary | ICD-10-CM

## 2021-12-30 NOTE — Telephone Encounter (Signed)
Called pt was advised  Labs ordered

## 2022-01-02 ENCOUNTER — Other Ambulatory Visit: Payer: Self-pay | Admitting: Internal Medicine

## 2022-01-02 ENCOUNTER — Other Ambulatory Visit: Payer: Medicare HMO

## 2022-01-02 ENCOUNTER — Other Ambulatory Visit (HOSPITAL_COMMUNITY): Payer: Self-pay

## 2022-01-02 MED ORDER — OMEPRAZOLE 20 MG PO CPDR
20.0000 mg | DELAYED_RELEASE_CAPSULE | Freq: Every day | ORAL | 0 refills | Status: DC
  Filled 2022-01-02: qty 90, 90d supply, fill #0

## 2022-01-03 ENCOUNTER — Other Ambulatory Visit (HOSPITAL_COMMUNITY): Payer: Self-pay

## 2022-01-04 ENCOUNTER — Other Ambulatory Visit (HOSPITAL_COMMUNITY): Payer: Self-pay

## 2022-01-05 ENCOUNTER — Telehealth (HOSPITAL_BASED_OUTPATIENT_CLINIC_OR_DEPARTMENT_OTHER): Payer: Self-pay

## 2022-01-05 ENCOUNTER — Other Ambulatory Visit (INDEPENDENT_AMBULATORY_CARE_PROVIDER_SITE_OTHER): Payer: Medicare HMO

## 2022-01-05 ENCOUNTER — Other Ambulatory Visit (HOSPITAL_COMMUNITY): Payer: Self-pay

## 2022-01-05 DIAGNOSIS — I10 Essential (primary) hypertension: Secondary | ICD-10-CM

## 2022-01-05 LAB — COMPREHENSIVE METABOLIC PANEL
ALT: 13 U/L (ref 0–35)
AST: 15 U/L (ref 0–37)
Albumin: 4.1 g/dL (ref 3.5–5.2)
Alkaline Phosphatase: 56 U/L (ref 39–117)
BUN: 14 mg/dL (ref 6–23)
CO2: 29 mEq/L (ref 19–32)
Calcium: 9.1 mg/dL (ref 8.4–10.5)
Chloride: 98 mEq/L (ref 96–112)
Creatinine, Ser: 0.64 mg/dL (ref 0.40–1.20)
GFR: 79.87 mL/min (ref >60.00)
Glucose, Bld: 101 mg/dL — ABNORMAL HIGH (ref 70–99)
Potassium: 3.6 mEq/L (ref 3.5–5.1)
Sodium: 133 mEq/L — ABNORMAL LOW (ref 135–145)
Total Bilirubin: 0.3 mg/dL (ref 0.2–1.2)
Total Protein: 7.3 g/dL (ref 6.0–8.3)

## 2022-01-06 ENCOUNTER — Other Ambulatory Visit (HOSPITAL_COMMUNITY): Payer: Self-pay

## 2022-01-06 ENCOUNTER — Other Ambulatory Visit: Payer: Self-pay

## 2022-01-06 DIAGNOSIS — I10 Essential (primary) hypertension: Secondary | ICD-10-CM

## 2022-01-06 NOTE — Addendum Note (Signed)
Addended by: Laure Kidney on: 01/06/2022 08:57 AM   Modules accepted: Orders

## 2022-01-07 ENCOUNTER — Other Ambulatory Visit (HOSPITAL_COMMUNITY): Payer: Self-pay

## 2022-01-10 ENCOUNTER — Telehealth: Payer: Self-pay | Admitting: Family Medicine

## 2022-01-10 ENCOUNTER — Other Ambulatory Visit (HOSPITAL_COMMUNITY): Payer: Self-pay

## 2022-01-10 NOTE — Telephone Encounter (Signed)
Called pt was advised  

## 2022-01-10 NOTE — Telephone Encounter (Signed)
Patient called to see if Dr. Charlett Blake recommends her getting the covid vaccine that is out now. Please call to advise.

## 2022-01-11 ENCOUNTER — Other Ambulatory Visit (HOSPITAL_COMMUNITY): Payer: Self-pay

## 2022-01-11 MED ORDER — MONTELUKAST SODIUM 10 MG PO TABS
ORAL_TABLET | ORAL | 0 refills | Status: DC
  Filled 2022-01-11: qty 90, 90d supply, fill #0

## 2022-01-13 ENCOUNTER — Other Ambulatory Visit (HOSPITAL_COMMUNITY): Payer: Self-pay

## 2022-01-16 ENCOUNTER — Other Ambulatory Visit (HOSPITAL_BASED_OUTPATIENT_CLINIC_OR_DEPARTMENT_OTHER): Payer: Self-pay

## 2022-01-17 ENCOUNTER — Other Ambulatory Visit (HOSPITAL_COMMUNITY): Payer: Self-pay

## 2022-01-23 ENCOUNTER — Other Ambulatory Visit (HOSPITAL_COMMUNITY): Payer: Self-pay

## 2022-01-23 ENCOUNTER — Ambulatory Visit (HOSPITAL_BASED_OUTPATIENT_CLINIC_OR_DEPARTMENT_OTHER)
Admission: RE | Admit: 2022-01-23 | Discharge: 2022-01-23 | Disposition: A | Discharge status: Home / Self Care | Payer: Medicare HMO | Source: Ambulatory Visit | Attending: Family Medicine | Admitting: Family Medicine

## 2022-01-23 ENCOUNTER — Ambulatory Visit (INDEPENDENT_AMBULATORY_CARE_PROVIDER_SITE_OTHER): Payer: Medicare HMO | Admitting: Family Medicine

## 2022-01-23 ENCOUNTER — Encounter: Payer: Self-pay | Admitting: Family Medicine

## 2022-01-23 VITALS — BP 140/80 | HR 69 | Temp 98.0°F | Ht 65.0 in | Wt 171.6 lb

## 2022-01-23 DIAGNOSIS — M954 Acquired deformity of chest and rib: Secondary | ICD-10-CM | POA: Diagnosis not present

## 2022-01-23 DIAGNOSIS — R079 Chest pain, unspecified: Secondary | ICD-10-CM | POA: Diagnosis not present

## 2022-01-23 DIAGNOSIS — R350 Frequency of micturition: Secondary | ICD-10-CM

## 2022-01-23 DIAGNOSIS — R3129 Other microscopic hematuria: Secondary | ICD-10-CM

## 2022-01-23 DIAGNOSIS — M19012 Primary osteoarthritis, left shoulder: Secondary | ICD-10-CM | POA: Diagnosis not present

## 2022-01-23 DIAGNOSIS — R0781 Pleurodynia: Secondary | ICD-10-CM | POA: Diagnosis not present

## 2022-01-23 DIAGNOSIS — S299XXA Unspecified injury of thorax, initial encounter: Secondary | ICD-10-CM | POA: Diagnosis not present

## 2022-01-23 LAB — URINALYSIS, MICROSCOPIC ONLY

## 2022-01-23 LAB — POCT URINALYSIS DIP (MANUAL ENTRY)
Bilirubin, UA: NEGATIVE
Glucose, UA: NEGATIVE mg/dL
Ketones, POC UA: NEGATIVE mg/dL
Nitrite, UA: NEGATIVE
Protein Ur, POC: NEGATIVE mg/dL
Spec Grav, UA: 1.01 (ref 1.010–1.025)
Urobilinogen, UA: 0.2 E.U./dL
pH, UA: 5 (ref 5.0–8.0)

## 2022-01-23 MED ORDER — CEPHALEXIN 500 MG PO CAPS
500.0000 mg | ORAL_CAPSULE | Freq: Two times a day (BID) | ORAL | 0 refills | Status: DC
  Filled 2022-01-23: qty 14, 7d supply, fill #0

## 2022-01-23 MED ORDER — OXYCODONE HCL 5 MG PO TABS
5.0000 mg | ORAL_TABLET | Freq: Four times a day (QID) | ORAL | 0 refills | Status: DC | PRN
  Filled 2022-01-23: qty 15, 4d supply, fill #0

## 2022-01-23 NOTE — Patient Instructions (Addendum)
I will be in touch with your x-rays asap We will also get a urine culture For now start on keflex twice a day for one week for UTI Ok to use pain medication sparingly - can cause sedation, do not drive when using this medication  Please let me know if not improving in the next few days!

## 2022-01-23 NOTE — Progress Notes (Addendum)
[place] [facility] [address] [place], [place] [address] [phone]  Date:  01/23/2022   Name:  Alexis Olson   DOB:  1933-12-31   MRN:  [id number]  PCP:  Mosie Lukes, MD    Chief Complaint: slid down on a suitcase  (Man help her up and hurt her ribs on left side. Bruised and sore ) and Urinary Frequency (And odor )   History of Present Illness:  Alexis Olson is a 88 y.o. very pleasant female patient who presents with the following:  Pt of Dr Charlett Blake here today with concern of a fall and flank pain Last seen here by Dr Charlett Blake in September  History of osteoporosis, SAH,  Pt notes she was out of town in [place] last week - she was going to the bathroom and she somehow tripped or stumbled over her suitcase and slid to the floor Firefighters came out to help her get up Pt notes she was ok until they tried to lift her up- this caused severe pain under her left arm, she wonders if she might have a rib fracture   She is also having some urinary sx- she notes she recently had some diarrhea.  Diarrhea cleared up but she is having urinary frequency and odor No hematuria  No fever or vomiting No cough  Patient Active Problem List   Diagnosis Date Noted   Pain in joint of left knee 04/18/2021   Difficulty walking 03/31/2021   S/P total knee arthroplasty, left 01/25/2021   Allergic rhinitis due to animal (cat) (dog) hair and dander 12/04/2020   Allergic rhinitis due to pollen 12/04/2020   Chronic allergic conjunctivitis 12/04/2020   Mild persistent asthma, uncomplicated 53/74/8270   Atrial fibrillation ([place]) 09/04/2020   SAH (subarachnoid hemorrhage) ([place]) 07/12/2020   Pseudogout 07/12/2020   Sinus node dysfunction ([place]) 07/12/2020   Sepsis without acute organ dysfunction ([place])    Arthritis of left knee 04/06/2020   Hyperglycemia 02/11/2020   Gout 02/11/2020   Left foot pain 02/11/2020   Navicular fracture of ankle  12/20/2019   Neck pain 12/20/2019   Late effects of accidental fall 11/13/2019   DVT (deep venous thrombosis) ([place]) 11/13/2019   Hypomagnesemia 11/13/2019   Swelling of right hand 11/13/2019   Stiffness of right knee 10/08/2019   S/P TKR (total knee replacement), right 08/26/2019   Ventral hernia without obstruction or gangrene 05/22/2018   Closed fracture of left pubis ([place]) 11/30/2017   Fracture of pelvis ([place]) 09/26/2017   Osteoarthritis of knee 04/27/2017   Greenfield filter in place 03/08/2017   Pain of right lower extremity 03/08/2017   Preventative health care 02/13/2017   Hyponatremia 05/02/2016   GERD (gastroesophageal reflux disease) 08/23/2015   HSV (herpes simplex virus) infection 08/16/2015   Hyperlipidemia, mild 08/16/2015   Osteoporosis    Hypokalemia    Chronic diarrhea    Anemia of chronic disease    Pallor of optic disc 02/06/2011   OSTEOMYELITIS, VERTEBRA 05/05/2009   [place] DISEASE, LUMBOSACRAL SPINE 04/22/2009   Essential hypertension 03/30/2009   Vitamin B 12 deficiency 07/09/2008   DIVERTICULOSIS, COLON 07/09/2008   COLONIC POLYPS, ADENOMATOUS, HX OF 07/09/2008   Anxiety state 08/08/2007   INSOMNIA 08/08/2007   Hx of bariatric surgery 08/08/2007   Midline back pain 10/30/2006   ALLERGIC RHINITIS 09/18/2006   Asthma 09/18/2006    Past Medical History:  Diagnosis Date   ALLERGIC RHINITIS  ANEMIA, CHRONIC    Malabsorption related to bypass hx, B12 and iron deficiency   ANXIETY    Arthritis    ASTHMA    Bariatric surgery status    Bowel perforation ([place]) 2016   C. difficile diarrhea    Colon perforation ([place]) 08/2014   following polypetomy during colo, surgical repair   COLONIC POLYPS, ADENOMATOUS, HX OF 2010   DIVERTICULOSIS, COLON    DVT (deep venous thrombosis) ([place]) 2018   DVT (deep venous thrombosis) (H[facility])    GERD (gastroesophageal reflux disease) 08/23/2015   Graded compression stocking in place    History of blood transfusion     History of migraine    History of rheumatic fever    HSV (herpes simplex virus) infection 08/16/2015   Hyperlipidemia    Hypertension    Denies   Hyponatremia 05/02/2016   Incomplete left bundle branch block (LBBB) 2016   Noted on EKG   INSOMNIA    Lumbosacral disc disease    Chronic pain; History of osteomyelitis 2010 following ESI complication   Obesity    Osteomyelitis (H[facility])    Pelvic fracture (H[facility])    Pneumonia    Recurrent UTI 08/23/2015   URINARY URGENCY    Ventral hernia without obstruction or gangrene 05/22/2018   VITAMIN B12 DEFICIENCY     Past Surgical History:  Procedure Laterality Date   BACK SURGERY  1994   for ruptured disc   CHOLECYSTECTOMY     COLONOSCOPY     greenfield filter     ileojejunal bypass  1976   for obesity   LAPAROTOMY N/A 09/18/2014   Procedure: EXPLORATORY LAPAROTOMY WITH REPAIR OF CECIAL PERFORATION;  Surgeon: Autumn Messing III, MD;  Location: [facility];  Service: General;  Laterality: N/A;   TONSILLECTOMY     TOTAL KNEE ARTHROPLASTY Right 08/26/2019   Procedure: RIGHT TOTAL KNEE ARTHROPLASTY, LEFT KNEE CORTISONE INJECTION;  Surgeon: Paralee Cancel, MD;  Location: [facility];  Service: Orthopedics;  Laterality: Right;  70 MINS   TOTAL KNEE ARTHROPLASTY Left 01/25/2021   Procedure: TOTAL KNEE ARTHROPLASTY;  Surgeon: Paralee Cancel, MD;  Location: [facility];  Service: Orthopedics;  Laterality: Left;   UPPER GI ENDOSCOPY     VASCULAR SURGERY Right    revascularization right lower extremity    Social History   Tobacco Use   Smoking status: Never   Smokeless tobacco: Never  Vaping Use   Vaping Use: Never used  Substance Use Topics   Alcohol use: No    Alcohol/week: 0.0 standard drinks of alcohol   Drug use: No    Family History  Problem Relation Age of Onset   Cervical cancer Mother    Stroke Mother    Liver disease Sister    Kidney disease Sister    Cirrhosis Sister    Colon cancer Neg Hx    Esophageal cancer Neg Hx    Rectal cancer Neg Hx     Stomach cancer Neg Hx     Allergies  Allergen Reactions   Ativan [Lorazepam] Anaphylaxis and Other (See Comments)    Deathly allergic; "resp rate dropped dropped to 6"   Other Anaphylaxis    Red peppers--choking, also   Azithromycin Diarrhea   Benadryl [Diphenhydramine Hcl] Other (See Comments)    Paradoxical response   Ciprofloxacin Hcl Other (See Comments)    Causes yeast infection and refuses to take   Codeine Phosphate Nausea Only    Can tolerate in limited amounts   Hydrocodone Itching and  Nausea And Vomiting   Hydrocodone Bit-[place] Mbr Itching and Nausea And Vomiting   Levaquin [Levofloxacin In D5w] Other (See Comments)    Muscle soreness   Meperidine Hcl Other (See Comments)    Reaction ??   Molds & Smuts Other (See Comments)    unsure   Sulfa Antibiotics Other (See Comments)    Joint pain   Tape Other (See Comments)    No Coban wrap (per the patient)   Ultram [Tramadol Hcl] Other (See Comments)    "Can't move joints"    Medication list has been reviewed and updated.  Current Outpatient Medications on File Prior to Visit  Medication Sig Dispense Refill   acetaminophen (TYLENOL) 500 MG tablet Take 2 tablets (1,000 mg total) by mouth every 8 (eight) hours. (Patient taking differently: Take 1,000 mg by mouth every 8 (eight) hours as needed for mild pain.) 30 tablet 0   albuterol (PROVENTIL HFA;VENTOLIN HFA) 108 (90 BASE) MCG/ACT inhaler Inhale 1-2 puffs into the lungs every 6 (six) hours as needed for wheezing or shortness of breath.     ALPRAZolam (XANAX) 0.5 MG tablet Take 1 tablet by mouth at bedtime as needed for anxiety. 30 tablet 3   amitriptyline (ELAVIL) 100 MG tablet Take 1 tablet (100 mg total) by mouth daily. 90 tablet 1   amoxicillin (AMOXIL) 500 MG capsule Take 4 capsules by mouth 1 hour prior to dental work 4 capsule 4   apixaban (ELIQUIS) 5 MG TABS tablet Take 1 tablet (5 mg total) by mouth every 12 (twelve) hours. 180 tablet 3   azelastine (OPTIVAR)  0.05 % ophthalmic solution Place 1 drop into both eyes daily as needed (for dry eyes).     budesonide-formoterol (SYMBICORT) 160-4.5 MCG/ACT inhaler Inhale 2 puffs into the lungs See admin instructions. Inhale 2 puffs into the lungs one to two times a day 1 Inhaler 2   cetirizine (ZYRTEC) 10 MG tablet Take 10 mg by mouth daily.      COVID-19 mRNA Vac-TriS, Pfizer, SUSP injection Inject into the muscle. 0.3 mL 0   cyanocobalamin (,VITAMIN B-12,) 1000 MCG/ML injection INJECT 1CC INTO THE MUSCLE EVERY MONTH (Patient taking differently: Inject 1,000 mcg into the muscle every 30 (thirty) days.) 10 mL 1   dicyclomine (BENTYL) 10 MG capsule Take 1 capsule by mouth 3 times daily as needed for diarrhea and symptoms. 270 capsule 0   dicyclomine (BENTYL) 20 MG tablet Take 1/2 to 1 tablet by mouth 3 times daily as needed. 90 tablet 3   diphenoxylate-atropine (LOMOTIL) 2.5-0.025 MG tablet Take 1 to 2 tablets by mouth up to 4 times a day 180 tablet 3   docusate sodium (COLACE) 100 MG capsule Take 1 capsule (100 mg total) by mouth 2 (two) times daily. (Patient not taking: Reported on 09/21/2021) 10 capsule 0   Fe Fum-FA-B Cmp-C-Zn-Mg-Mn-Cu (HEMOCYTE PLUS) 106-1 MG CAPS TAKE 1 CAPSULE BY MOUTH TWO TIMES DAILY 60 capsule 1   fluticasone (FLONASE) 50 MCG/ACT nasal spray Place 1 to 2 sprays in each nostril once a day 16 g 3   gabapentin (NEURONTIN) 300 MG capsule TAKE 1 CAPSULE BY MOUTH AT BEDTIME 90 capsule 1   hydrOXYzine (ATARAX) 25 MG tablet Take 25 mg by mouth 3 (three) times daily as needed for itching.     hydrOXYzine (ATARAX) 25 MG tablet Take 1 tablet by mouth three times a day 90 tablet 3   levocetirizine (XYZAL) 5 MG tablet Take 1 tablet by mouth in the evening  90 tablet 2   magic mouthwash (nystatin, hydrocortisone, diphenhydrAMINE) suspension Swish, gargle and spit 5 to 10 mLs 4 times daily as needed. 160 mL 1   magnesium oxide (MAG-OX) 400 MG tablet Take 400 mg by mouth 2 (two) times daily.      methocarbamol (ROBAXIN) 500 MG tablet Take 1 tablet (500 mg total) by mouth every 6 (six) hours as needed for muscle spasms. 40 tablet 0   metoprolol succinate (TOPROL XL) 25 MG 24 hr tablet Take 1 tablet (25 mg total) by mouth daily. 90 tablet 2   montelukast (SINGULAIR) 10 MG tablet Take 1 tablet by mouth in the evening once daily 90 tablet 0   omeprazole (PRILOSEC) 20 MG capsule Take 1 capsule by mouth daily. 90 capsule 0   ondansetron (ZOFRAN-ODT) 4 MG disintegrating tablet Take 1 tablet (4 mg total) by mouth 3 (three) times daily as needed for nausea or vomiting. 20 tablet 0   oxyCODONE (OXY IR/ROXICODONE) 5 MG immediate release tablet Take 1 tablet by mouth every 6 hours as needed 60 tablet 0   phenazopyridine (PYRIDIUM) 100 MG tablet Take 1 tablet (100 mg total) by mouth 3 (three) times daily as needed for pain. 20 tablet 0   polyethylene glycol powder (GLYCOLAX/MIRALAX) 17 GM/SCOOP powder Mix 17 g in 4 oz of water or juice and drink by mouth daily as needed for mild constipation. 238 g 0   potassium chloride SA (KLOR-CON M) 20 MEQ tablet Take 1 tablet (20 mEq total) by mouth daily. 90 tablet 1   Probiotic Product (PROBIOTIC DAILY PO) Take 1 capsule by mouth daily.     terconazole (TERAZOL 7) 0.4 % vaginal cream Insert 1 applicator vaginally at bedtime for 7 nights.  Apply externally as well nightly. 45 g 0   triamcinolone (KENALOG) 0.025 % ointment Apply 1 application topically 2 (two) times daily. 30 g 0   Vitamin D, Ergocalciferol, (DRISDOL) 1.25 MG (50000 UNIT) CAPS capsule Take 1 capsule (50,000 Units total) by mouth every 7 (seven) days. Take 1 tablet by mouth weekly for 12 weeks 4 capsule 4   zolpidem (AMBIEN) 5 MG tablet TAKE 1 TABLET BY MOUTH AT BEDTIME 90 tablet 1   No current facility-administered medications on file prior to visit.    Review of Systems:  As per HPI- otherwise negative.   Physical Examination: Vitals:   01/23/22 1205  BP: (!) 140/80  Pulse: 69  Temp: 98  F (36.7 C)  SpO2: 96%   Vitals:   01/23/22 1205  Weight: 171 lb 9.6 oz (77.8 kg)  Height: '5\' 5"'$  (1.651 m)   Body mass index is 28.56 kg/m. Ideal Body Weight: Weight in (lb) to have BMI = 25: 149.9  GEN: no acute distress. Overweight, looks well  HEENT: Atraumatic, Normocephalic.  Ears and Nose: No external deformity. CV: Rate controlled atrial fibrillation, No M/G/R. No JVD. No thrill. No extra heart sounds. PULM: CTA B, no wheezes, crackles, rhonchi. No retractions. No resp. distress. No accessory muscle use. ABD: S, NT, N. No rebound. No HSM. EXTR: No c/c/e PSYCH: Normally interactive. Conversant.  Slow gait consistent with age, uses cane Belly is benign, no CVA tenderness There is a bruise over the left lateral breast.  Patient is tender to palpation in the left axillary region  Results for orders placed or performed in visit on 01/23/22  Urine Microscopic Only  Result Value Ref Range   WBC, UA TNTC(>50/hpf) (A) 0-2/hpf   RBC / HPF 3-6/hpf (  A) 0-2/hpf   Mucus, UA Presence of (A) None   Squamous Epithelial / LPF Rare(0-4/hpf) Rare(0-4/hpf)   Bacteria, UA Rare(<10/hpf) (A) None  POCT urinalysis dipstick  Result Value Ref Range   Color, UA light yellow (A) yellow   Clarity, UA cloudy (A) clear   Glucose, UA negative negative mg/dL   Bilirubin, UA negative negative   Ketones, POC UA negative negative mg/dL   Spec Grav, UA 1.010 1.010 - 1.025   Blood, UA trace-intact (A) negative   pH, UA 5.0 5.0 - 8.0   Protein Ur, POC negative negative mg/dL   Urobilinogen, UA 0.2 0.2 or 1.0 E.U./dL   Nitrite, UA Negative Negative   Leukocytes, UA Small (1+) (A) Negative   *Note: Due to a large number of results and/or encounters for the requested time period, some results have not been displayed. A complete set of results can be found in Results Review.    Assessment and Plan: Rib pain - Plan: DG Ribs Unilateral Left, DG Chest 2 View, oxyCODONE (ROXICODONE) 5 MG immediate release  tablet  Urinary frequency - Plan: cephALEXin (KEFLEX) 500 MG capsule, Urine Culture, POCT urinalysis dipstick  Microhematuria - Plan: Urine Microscopic Only  Patient seen today to assess a couple of issues.  She is concerned about possible UTI.  Urinalysis as above, urine micro and culture are pending.  We will start her on Keflex 500 twice daily for the time being-suspect UTI She fell recently, was lifted to her feet by emergency services-she thinks being lifted may have actually caused a rib fracture.  We will obtain x-rays of her left ribs and chest today.  She is using an incentive spirometer at home, encouraged her to continue this Patient notes she uses oxycodone as needed for pain.  I refilled 15 tablets today  Signed Lamar Blinks, MD  Received her x-rays as below, called pt and gave her results She does have 3 rib fractures. Occurred about 5 days ago and she is stable.  She is using incentive spirometer- continue to do this, seek care if feeling worse, fever or cough.  She states understanding  We will be in touch with her urine culture  DG Chest 2 View  Result Date: 01/23/2022 CLINICAL DATA:  Trauma, fall, left chest pain EXAM: CHEST - 2 VIEW COMPARISON:  01/30/2021 FINDINGS: Cardiac size is within normal limits. Thoracic aorta is tortuous. Lung fields are clear of any infiltrates or pulmonary edema. There is minimal blunting of both lateral CP angles. There is no pneumothorax. IMPRESSION: There are no focal pulmonary infiltrates. Blunting of both lateral CP angles may suggest minimal pleural effusions or pleural thickening. There is no pneumothorax. Electronically Signed   By: Elmer Picker M.D.   On: 01/23/2022 13:10   DG Ribs Unilateral Left  Result Date: 01/23/2022 CLINICAL DATA:  Trauma, recent fall EXAM: LEFT RIBS - 2 VIEW COMPARISON:  None Available. FINDINGS: Deformities are noted in anterior aspects of left eighth, ninth and tenth ribs. There is minimal blunting of  left lateral CP angle. Degenerative changes are noted in left shoulder. IMPRESSION: Deformities are seen in the anterior left eighth, ninth and tenth ribs suggesting recent or old undisplaced fractures. Electronically Signed   By: Elmer Picker M.D.   On: 01/23/2022 13:08

## 2022-01-26 ENCOUNTER — Other Ambulatory Visit (HOSPITAL_BASED_OUTPATIENT_CLINIC_OR_DEPARTMENT_OTHER): Payer: Self-pay | Admitting: Obstetrics & Gynecology

## 2022-01-26 ENCOUNTER — Other Ambulatory Visit (HOSPITAL_COMMUNITY): Payer: Self-pay

## 2022-01-26 ENCOUNTER — Encounter: Payer: Self-pay | Admitting: Family Medicine

## 2022-01-26 DIAGNOSIS — N9089 Other specified noninflammatory disorders of vulva and perineum: Secondary | ICD-10-CM

## 2022-01-26 LAB — URINE CULTURE
MICRO NUMBER:: 14117851
SPECIMEN QUALITY:: ADEQUATE

## 2022-01-27 ENCOUNTER — Other Ambulatory Visit (HOSPITAL_COMMUNITY): Payer: Self-pay

## 2022-01-30 ENCOUNTER — Other Ambulatory Visit (HOSPITAL_COMMUNITY): Payer: Self-pay

## 2022-01-30 ENCOUNTER — Telehealth: Payer: Self-pay | Admitting: Family Medicine

## 2022-01-30 DIAGNOSIS — M79601 Pain in right arm: Secondary | ICD-10-CM

## 2022-01-30 MED ORDER — TERCONAZOLE 0.4 % VA CREA
1.0000 | TOPICAL_CREAM | Freq: Every day | VAGINAL | 0 refills | Status: DC
  Filled 2022-01-30: qty 45, 7d supply, fill #0

## 2022-01-30 NOTE — Telephone Encounter (Signed)
Called patient back, she states her right upper arm/humerus area it was hurting last week but she was distracted by her rib pain and forgot to mention it.  She would like to get an x-ray if possible.  I ordered a right humerus x-ray to be done at her convenience at the med center

## 2022-01-30 NOTE — Telephone Encounter (Signed)
Alexis Olson pt: but was seen by you on 01/23/22. Please advise:

## 2022-01-30 NOTE — Telephone Encounter (Signed)
Pt called stating that she had forgotten to bring up her arm pain that she had been having in her appt and was wondering if she could have an xray put in to look at her arm. Please Advise.

## 2022-01-31 ENCOUNTER — Other Ambulatory Visit (HOSPITAL_COMMUNITY): Payer: Self-pay

## 2022-01-31 ENCOUNTER — Ambulatory Visit (HOSPITAL_BASED_OUTPATIENT_CLINIC_OR_DEPARTMENT_OTHER)
Admission: RE | Admit: 2022-01-31 | Discharge: 2022-01-31 | Disposition: A | Discharge status: Home / Self Care | Payer: Medicare HMO | Source: Ambulatory Visit | Attending: Family Medicine | Admitting: Family Medicine

## 2022-01-31 DIAGNOSIS — M79601 Pain in right arm: Secondary | ICD-10-CM | POA: Insufficient documentation

## 2022-01-31 DIAGNOSIS — M79621 Pain in right upper arm: Secondary | ICD-10-CM | POA: Diagnosis not present

## 2022-01-31 DIAGNOSIS — M19011 Primary osteoarthritis, right shoulder: Secondary | ICD-10-CM | POA: Diagnosis not present

## 2022-02-01 ENCOUNTER — Encounter: Payer: Self-pay | Admitting: Family Medicine

## 2022-02-01 ENCOUNTER — Telehealth: Payer: Self-pay

## 2022-02-01 NOTE — Telephone Encounter (Signed)
error 

## 2022-02-03 ENCOUNTER — Other Ambulatory Visit (HOSPITAL_COMMUNITY): Payer: Self-pay

## 2022-02-03 ENCOUNTER — Other Ambulatory Visit: Payer: Self-pay | Admitting: Internal Medicine

## 2022-02-06 NOTE — Telephone Encounter (Signed)
Patient is requesting Lomotil. Please advise

## 2022-02-07 ENCOUNTER — Other Ambulatory Visit (HOSPITAL_COMMUNITY): Payer: Self-pay

## 2022-02-07 MED ORDER — DIPHENOXYLATE-ATROPINE 2.5-0.025 MG PO TABS
ORAL_TABLET | ORAL | 1 refills | Status: DC
  Filled 2022-02-07: qty 180, 23d supply, fill #0
  Filled 2022-05-01: qty 180, 23d supply, fill #1

## 2022-02-07 NOTE — Telephone Encounter (Signed)
I refilled it - please let her know and also tell her I need her to come in for a follow-up for more refills

## 2022-02-07 NOTE — Telephone Encounter (Signed)
Patient called requesting a refill on Lomotil also requested a call once done she states she is out of it and needs it.

## 2022-02-09 ENCOUNTER — Other Ambulatory Visit (HOSPITAL_COMMUNITY): Payer: Self-pay

## 2022-02-10 ENCOUNTER — Other Ambulatory Visit (HOSPITAL_COMMUNITY): Payer: Self-pay

## 2022-02-10 MED ORDER — LEVOCETIRIZINE DIHYDROCHLORIDE 5 MG PO TABS
5.0000 mg | ORAL_TABLET | Freq: Every evening | ORAL | 0 refills | Status: DC
  Filled 2022-02-10: qty 90, 90d supply, fill #0

## 2022-02-17 ENCOUNTER — Other Ambulatory Visit (HOSPITAL_COMMUNITY): Payer: Self-pay

## 2022-02-21 ENCOUNTER — Other Ambulatory Visit (HOSPITAL_COMMUNITY): Payer: Self-pay

## 2022-02-22 ENCOUNTER — Other Ambulatory Visit (HOSPITAL_COMMUNITY): Payer: Self-pay

## 2022-02-24 ENCOUNTER — Other Ambulatory Visit (HOSPITAL_COMMUNITY): Payer: Self-pay

## 2022-02-24 ENCOUNTER — Telehealth: Payer: Self-pay | Admitting: Family Medicine

## 2022-02-24 NOTE — Telephone Encounter (Signed)
Patient states that her Vit D was only refilled for 4 pills instead of the usual 12 and she had to pay the same amount of money for the 4 pills. She would like to know why only 4 pills were sent. Please advise.

## 2022-02-25 NOTE — Patient Instructions (Incomplete)
It was good to see you again today Recommend the latest COVID booster if not done already
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
## 2022-02-27 ENCOUNTER — Other Ambulatory Visit: Payer: Self-pay

## 2022-02-27 ENCOUNTER — Other Ambulatory Visit (HOSPITAL_COMMUNITY): Payer: Self-pay

## 2022-02-27 ENCOUNTER — Ambulatory Visit: Payer: Medicare HMO | Admitting: Family Medicine

## 2022-02-27 MED ORDER — VITAMIN D (ERGOCALCIFEROL) 1.25 MG (50000 UNIT) PO CAPS
50000.0000 [IU] | ORAL_CAPSULE | ORAL | 0 refills | Status: DC
  Filled 2022-02-27 – 2022-03-17 (×2): qty 12, 84d supply, fill #0

## 2022-02-27 NOTE — Telephone Encounter (Signed)
Called pt was advised and sent in medication

## 2022-02-28 ENCOUNTER — Other Ambulatory Visit: Payer: Self-pay | Admitting: Internal Medicine

## 2022-02-28 ENCOUNTER — Other Ambulatory Visit (HOSPITAL_COMMUNITY): Payer: Self-pay

## 2022-02-28 MED ORDER — HEMOCYTE PLUS 106-1 MG PO CAPS
1.0000 | ORAL_CAPSULE | Freq: Two times a day (BID) | ORAL | 1 refills | Status: DC
  Filled 2022-02-28 – 2022-03-02 (×2): qty 30, 15d supply, fill #0
  Filled 2022-05-01: qty 60, 30d supply, fill #1
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
## 2022-03-01 ENCOUNTER — Other Ambulatory Visit (HOSPITAL_COMMUNITY): Payer: Self-pay

## 2022-03-01 ENCOUNTER — Ambulatory Visit (INDEPENDENT_AMBULATORY_CARE_PROVIDER_SITE_OTHER): Payer: Medicare HMO | Admitting: Family Medicine

## 2022-03-01 VITALS — BP 122/72 | HR 62 | Temp 97.7°F | Resp 18 | Wt 175.2 lb

## 2022-03-01 DIAGNOSIS — R350 Frequency of micturition: Secondary | ICD-10-CM

## 2022-03-01 DIAGNOSIS — G8929 Other chronic pain: Secondary | ICD-10-CM | POA: Diagnosis not present

## 2022-03-01 DIAGNOSIS — M25511 Pain in right shoulder: Secondary | ICD-10-CM | POA: Diagnosis not present

## 2022-03-01 LAB — POCT URINALYSIS DIP (MANUAL ENTRY)
Bilirubin, UA: NEGATIVE
Blood, UA: NEGATIVE
Glucose, UA: NEGATIVE mg/dL
Ketones, POC UA: NEGATIVE mg/dL
Leukocytes, UA: NEGATIVE
Nitrite, UA: NEGATIVE
Protein Ur, POC: NEGATIVE mg/dL
Spec Grav, UA: 1.01 (ref 1.010–1.025)
Urobilinogen, UA: 0.2 E.U./dL
pH, UA: 6 (ref 5.0–8.0)

## 2022-03-01 NOTE — Patient Instructions (Addendum)
Please get in touch with your orthopedist- DR Alvan Dame- about your shoulder   Address: [address], [place], [place] [address] Phone: [phone]  I will be in touch with your urine culture asap - please bring [place] a sample when you can

## 2022-03-02 ENCOUNTER — Other Ambulatory Visit (HOSPITAL_COMMUNITY): Payer: Self-pay

## 2022-03-02 ENCOUNTER — Other Ambulatory Visit: Payer: Medicare HMO

## 2022-03-02 LAB — URINE CULTURE
MICRO NUMBER:: 14278584
Result:: NO GROWTH
SPECIMEN QUALITY:: ADEQUATE

## 2022-03-03 ENCOUNTER — Encounter: Payer: Self-pay | Admitting: Family Medicine

## 2022-03-05 ENCOUNTER — Other Ambulatory Visit: Payer: Self-pay | Admitting: Family Medicine

## 2022-03-06 ENCOUNTER — Other Ambulatory Visit (HOSPITAL_COMMUNITY): Payer: Self-pay

## 2022-03-06 MED ORDER — GABAPENTIN 300 MG PO CAPS
300.0000 mg | ORAL_CAPSULE | Freq: Every day | ORAL | 1 refills | Status: DC
  Filled 2022-03-06: qty 90, 90d supply, fill #0
  Filled 2022-06-05: qty 90, 90d supply, fill #1

## 2022-03-10 ENCOUNTER — Other Ambulatory Visit (HOSPITAL_COMMUNITY): Payer: Self-pay

## 2022-03-10 DIAGNOSIS — M19011 Primary osteoarthritis, right shoulder: Secondary | ICD-10-CM | POA: Diagnosis not present

## 2022-03-10 MED ORDER — METHOCARBAMOL 500 MG PO TABS
500.0000 mg | ORAL_TABLET | Freq: Three times a day (TID) | ORAL | 0 refills | Status: AC | PRN
  Filled 2022-03-10 (×2): qty 60, 20d supply, fill #0

## 2022-03-10 MED ORDER — OXYCODONE-ACETAMINOPHEN 5-325 MG PO TABS
1.0000 | ORAL_TABLET | ORAL | 0 refills | Status: DC | PRN
  Filled 2022-03-10: qty 30, 5d supply, fill #0

## 2022-03-11 ENCOUNTER — Other Ambulatory Visit (HOSPITAL_COMMUNITY): Payer: Self-pay

## 2022-03-13 ENCOUNTER — Other Ambulatory Visit: Payer: Self-pay

## 2022-03-13 ENCOUNTER — Other Ambulatory Visit (HOSPITAL_COMMUNITY): Payer: Self-pay

## 2022-03-13 DIAGNOSIS — J3081 Allergic rhinitis due to animal (cat) (dog) hair and dander: Secondary | ICD-10-CM | POA: Diagnosis not present

## 2022-03-13 DIAGNOSIS — J3089 Other allergic rhinitis: Secondary | ICD-10-CM | POA: Diagnosis not present

## 2022-03-13 DIAGNOSIS — J4530 Mild persistent asthma, uncomplicated: Secondary | ICD-10-CM | POA: Diagnosis not present

## 2022-03-13 DIAGNOSIS — J301 Allergic rhinitis due to pollen: Secondary | ICD-10-CM | POA: Diagnosis not present

## 2022-03-13 MED ORDER — AEROCHAMBER PLUS FLO-VU MISC
1 refills | Status: AC
  Filled 2022-03-13: qty 1, 30d supply, fill #0

## 2022-03-13 MED ORDER — HYDROXYZINE HCL 25 MG PO TABS
25.0000 mg | ORAL_TABLET | Freq: Three times a day (TID) | ORAL | 6 refills | Status: DC
  Filled 2022-03-13: qty 90, 30d supply, fill #0
  Filled 2022-08-21 – 2022-08-22 (×3): qty 90, 30d supply, fill #1
  Filled 2023-01-01: qty 90, 30d supply, fill #2

## 2022-03-13 MED ORDER — FLUTICASONE PROPIONATE 50 MCG/ACT NA SUSP
1.0000 | Freq: Every day | NASAL | 6 refills | Status: DC
  Filled 2022-03-13: qty 16, 30d supply, fill #0

## 2022-03-17 ENCOUNTER — Other Ambulatory Visit: Payer: Self-pay

## 2022-03-17 ENCOUNTER — Other Ambulatory Visit (HOSPITAL_COMMUNITY): Payer: Self-pay

## 2022-03-21 ENCOUNTER — Other Ambulatory Visit: Payer: Self-pay | Admitting: Family Medicine

## 2022-03-21 NOTE — Telephone Encounter (Signed)
Requesting: Xanax, Ambien Contract: 08/30/2020 UDS: 11/11/2020 Last Visit: 03/01/2022 Next Visit: 06/20/2022 Last Refill: Xanax- 11/21/2021 Ambien-09/16/2021  Please Advise

## 2022-03-22 ENCOUNTER — Other Ambulatory Visit (HOSPITAL_COMMUNITY): Payer: Self-pay

## 2022-03-22 MED ORDER — ZOLPIDEM TARTRATE 5 MG PO TABS
5.0000 mg | ORAL_TABLET | Freq: Every day | ORAL | 1 refills | Status: DC
  Filled 2022-03-22: qty 90, 90d supply, fill #0
  Filled 2022-06-19: qty 90, 90d supply, fill #1

## 2022-03-22 MED ORDER — ALPRAZOLAM 0.5 MG PO TABS
0.5000 mg | ORAL_TABLET | Freq: Every evening | ORAL | 3 refills | Status: DC | PRN
  Filled 2022-03-22: qty 30, 30d supply, fill #0
  Filled 2022-04-24: qty 30, 30d supply, fill #1
  Filled 2022-05-25: qty 30, 30d supply, fill #2
  Filled 2022-06-23: qty 30, 30d supply, fill #3

## 2022-03-24 ENCOUNTER — Other Ambulatory Visit (HOSPITAL_COMMUNITY): Payer: Self-pay

## 2022-03-25 ENCOUNTER — Other Ambulatory Visit (HOSPITAL_COMMUNITY): Payer: Self-pay

## 2022-03-26 ENCOUNTER — Other Ambulatory Visit: Payer: Self-pay | Admitting: Internal Medicine

## 2022-03-28 ENCOUNTER — Other Ambulatory Visit: Payer: Self-pay

## 2022-03-28 MED ORDER — OMEPRAZOLE 20 MG PO CPDR
20.0000 mg | DELAYED_RELEASE_CAPSULE | Freq: Every day | ORAL | 0 refills | Status: DC
  Filled 2022-03-28: qty 90, 90d supply, fill #0

## 2022-03-31 ENCOUNTER — Other Ambulatory Visit: Payer: Self-pay | Admitting: Family Medicine

## 2022-03-31 ENCOUNTER — Other Ambulatory Visit (HOSPITAL_COMMUNITY): Payer: Self-pay

## 2022-03-31 MED ORDER — AMITRIPTYLINE HCL 100 MG PO TABS
100.0000 mg | ORAL_TABLET | Freq: Every day | ORAL | 1 refills | Status: DC
  Filled 2022-03-31: qty 90, 90d supply, fill #0
  Filled 2022-06-30: qty 90, 90d supply, fill #1

## 2022-04-01 ENCOUNTER — Other Ambulatory Visit (HOSPITAL_COMMUNITY): Payer: Self-pay

## 2022-04-02 DIAGNOSIS — M25511 Pain in right shoulder: Secondary | ICD-10-CM | POA: Diagnosis not present

## 2022-04-04 ENCOUNTER — Other Ambulatory Visit (HOSPITAL_COMMUNITY): Payer: Self-pay

## 2022-04-07 ENCOUNTER — Other Ambulatory Visit (HOSPITAL_BASED_OUTPATIENT_CLINIC_OR_DEPARTMENT_OTHER): Payer: Self-pay | Admitting: Family

## 2022-04-07 ENCOUNTER — Other Ambulatory Visit (HOSPITAL_COMMUNITY): Payer: Self-pay

## 2022-04-07 DIAGNOSIS — I480 Paroxysmal atrial fibrillation: Secondary | ICD-10-CM

## 2022-04-07 MED ORDER — METOPROLOL SUCCINATE ER 25 MG PO TB24
25.0000 mg | ORAL_TABLET | Freq: Every day | ORAL | 1 refills | Status: DC
  Filled 2022-04-07: qty 90, 90d supply, fill #0
  Filled 2022-07-11: qty 90, 90d supply, fill #1

## 2022-04-07 NOTE — Telephone Encounter (Signed)
Patient of Dr. Ross. Please review for refill. Thank you!  

## 2022-04-10 DIAGNOSIS — M19011 Primary osteoarthritis, right shoulder: Secondary | ICD-10-CM | POA: Diagnosis not present

## 2022-04-10 DIAGNOSIS — M25511 Pain in right shoulder: Secondary | ICD-10-CM | POA: Diagnosis not present

## 2022-04-13 ENCOUNTER — Other Ambulatory Visit (HOSPITAL_COMMUNITY): Payer: Self-pay

## 2022-04-14 ENCOUNTER — Other Ambulatory Visit (HOSPITAL_COMMUNITY): Payer: Self-pay

## 2022-04-17 ENCOUNTER — Other Ambulatory Visit (HOSPITAL_COMMUNITY): Payer: Self-pay

## 2022-04-17 ENCOUNTER — Other Ambulatory Visit: Payer: Self-pay

## 2022-04-17 MED ORDER — MONTELUKAST SODIUM 10 MG PO TABS
10.0000 mg | ORAL_TABLET | Freq: Every evening | ORAL | 2 refills | Status: DC
  Filled 2022-04-17: qty 90, 90d supply, fill #0
  Filled 2022-07-13: qty 90, 90d supply, fill #1
  Filled 2022-10-23: qty 90, 90d supply, fill #2

## 2022-04-19 ENCOUNTER — Ambulatory Visit: Payer: Medicare HMO | Admitting: Internal Medicine

## 2022-04-19 ENCOUNTER — Encounter: Payer: Self-pay | Admitting: Internal Medicine

## 2022-04-19 VITALS — BP 116/64 | HR 82 | Ht 65.0 in | Wt 177.0 lb

## 2022-04-19 DIAGNOSIS — R152 Fecal urgency: Secondary | ICD-10-CM

## 2022-04-19 DIAGNOSIS — N3941 Urge incontinence: Secondary | ICD-10-CM

## 2022-04-19 DIAGNOSIS — Z9884 Bariatric surgery status: Secondary | ICD-10-CM | POA: Diagnosis not present

## 2022-04-19 DIAGNOSIS — K529 Noninfective gastroenteritis and colitis, unspecified: Secondary | ICD-10-CM | POA: Diagnosis not present

## 2022-04-19 DIAGNOSIS — R159 Full incontinence of feces: Secondary | ICD-10-CM | POA: Diagnosis not present

## 2022-04-19 NOTE — Progress Notes (Unsigned)
Alexis Olson [place] 89 y.o. [birth year] [id number]  Assessment & Plan:   Encounter Diagnoses  Name Primary?   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 Reported remote history of jejunal ileal bypass in 1976.  She has been maintained on a regimen of generic Lomotil and dicyclomine.  Lab review September 2023 CBC with stable mild normocytic anemia hemoglobin 11.8.  TSH normal on this date also CMET 01/05/2022 sodium 133 glucose 101 otherwise normal  Lomotil + dicyclomine keep things under control though rare fecal urgency-related incontinence. She is wearing depends due to more frequent urge urinary incontinence problems. Allergies  Allergen Reactions   Ativan [Lorazepam] Anaphylaxis and Other (See Comments)    Deathly allergic; "resp rate dropped dropped to 6"   Other Anaphylaxis    Red peppers--choking, also   Azithromycin Diarrhea   Benadryl [Diphenhydramine Hcl] Other (See Comments)    Paradoxical response   Ciprofloxacin Hcl Other (See Comments)    Causes yeast infection and refuses to take   Codeine Phosphate Nausea Only    Can tolerate in limited amounts   Hydrocodone Itching and Nausea And Vomiting   Hydrocodone Bit-Homatrop Mbr Itching and Nausea And Vomiting   Levaquin [Levofloxacin In D5w] Other (See Comments)    Muscle soreness   Meperidine Hcl Other (See Comments)    Reaction ??   Molds & Smuts Other (See Comments)    unsure   Sulfa Antibiotics Other (See Comments)    Joint pain   Tape Other (See Comments)    No Coban wrap (per the patient)   Ultram [Tramadol Hcl] Other (See Comments)    "Can't move joints"   Current Meds  Medication Sig   acetaminophen  (TYLENOL) 500 MG tablet Take 2 tablets (1,000 mg total) by mouth every 8 (eight) hours. (Patient taking differently: Take 1,000 mg by mouth every 8 (eight) hours as needed for mild pain.)   albuterol (PROVENTIL HFA;VENTOLIN HFA) 108 (90 BASE) MCG/ACT inhaler Inhale 1-2 puffs into the lungs every 6 (six) hours as needed for wheezing or shortness of breath.   ALPRAZolam (XANAX) 0.5 MG tablet Take 1 tablet by mouth at bedtime as needed for anxiety.   amitriptyline (ELAVIL) 100 MG tablet Take 1 tablet (100 mg total) by mouth daily.   amoxicillin (AMOXIL) 500 MG capsule Take 4 capsules by mouth 1 hour prior to dental work   apixaban (ELIQUIS) 5 MG TABS tablet Take 1 tablet (5 mg total) by mouth every 12 (twelve) hours.   azelastine (OPTIVAR) 0.05 % ophthalmic solution Place 1 drop into both eyes daily as needed (for dry eyes).   budesonide-formoterol (SYMBICORT) 160-4.5 MCG/ACT inhaler Inhale 2 puffs into the lungs See admin instructions. Inhale 2 puffs into the lungs one to two times a day   cephALEXin (KEFLEX) 500 MG capsule Take 1 capsule (500 mg total) by mouth 2 (two) times daily.   cetirizine (ZYRTEC) 10 MG tablet Take 10 mg by mouth daily.    COVID-19 mRNA Vac-TriS, Pfizer, SUSP injection Inject into the muscle.   dicyclomine (BENTYL) 20 MG tablet Take 1/2 to 1 tablet by mouth 3 times daily as needed.  diphenoxylate-atropine (LOMOTIL) 2.5-0.025 MG tablet Take 1 to 2 tablets by mouth up to 4 times a day   docusate sodium (COLACE) 100 MG capsule Take 1 capsule (100 mg total) by mouth 2 (two) times daily.   Fe Fum-FA-B Cmp-C-Zn-Mg-Mn-Cu (HEMOCYTE PLUS) 106-1 MG CAPS TAKE 1 CAPSULE BY MOUTH TWO TIMES DAILY   fluticasone (FLONASE) 50 MCG/ACT nasal spray Place 1 to 2 sprays in each nostril once a day   gabapentin (NEURONTIN) 300 MG capsule Take 1 capsule (300 mg total) by mouth at bedtime.   hydrOXYzine (ATARAX) 25 MG tablet Take 1 tablet (25 mg total) by mouth 3 (three) times daily.   levocetirizine  (XYZAL) 5 MG tablet Take 1 tablet (5 mg total) by mouth every evening.   magic mouthwash (nystatin, hydrocortisone, diphenhydrAMINE) suspension Swish, gargle and spit 5 to 10 mLs 4 times daily as needed.   magnesium oxide (MAG-OX) 400 MG tablet Take 400 mg by mouth 2 (two) times daily.   methocarbamol (ROBAXIN) 500 MG tablet Take 1 tablet (500 mg total) by mouth 3 (three) times daily as needed.   metoprolol succinate (TOPROL XL) 25 MG 24 hr tablet Take 1 tablet (25 mg total) by mouth daily.   montelukast (SINGULAIR) 10 MG tablet Take 1 tablet (10 mg total) by mouth every evening.   omeprazole (PRILOSEC) 20 MG capsule Take 1 capsule (20 mg total) by mouth daily.   ondansetron (ZOFRAN-ODT) 4 MG disintegrating tablet Take 1 tablet (4 mg total) by mouth 3 (three) times daily as needed for nausea or vomiting.   phenazopyridine (PYRIDIUM) 100 MG tablet Take 1 tablet (100 mg total) by mouth 3 (three) times daily as needed for pain.   potassium chloride SA (KLOR-CON M) 20 MEQ tablet Take 1 tablet (20 mEq total) by mouth daily.   Probiotic Product (PROBIOTIC DAILY PO) Take 1 capsule by mouth daily.   Spacer/Aero-Holding Chambers (AEROCHAMBER PLUS WITH MASK) inhaler Use as directed   terconazole (TERAZOL 7) 0.4 % vaginal cream Insert 1 applicatorful vaginally at bedtime for 7 nights.  Apply externally as well nightly.   triamcinolone (KENALOG) 0.025 % ointment Apply 1 application topically 2 (two) times daily.   Vitamin D, Ergocalciferol, (DRISDOL) 1.25 MG (50000 UNIT) CAPS capsule Take 1 capsule (50,000 Units total) by mouth every 7 (seven) days for 12 weeks.   zolpidem (AMBIEN) 5 MG tablet Take 1 tablet (5 mg total) by mouth at bedtime.   Past Medical History:  Diagnosis Date   ALLERGIC RHINITIS    ANEMIA, CHRONIC    Malabsorption related to bypass hx, B12 and iron deficiency   ANXIETY    Arthritis    ASTHMA    Bariatric surgery status    Bowel perforation ([place]) 2016   C. difficile diarrhea     Colon perforation ([place]) 08/2014   following polypetomy during colo, surgical repair   COLONIC POLYPS, ADENOMATOUS, HX OF 2010   DIVERTICULOSIS, COLON    DVT (deep venous thrombosis) ([place]) 2018   DVT (deep venous thrombosis) ([facility])    GERD (gastroesophageal reflux disease) 08/23/2015   Graded compression stocking in place    History of blood transfusion    History of migraine    History of rheumatic fever    HSV (herpes simplex virus) infection 08/16/2015   Hyperlipidemia    Hypertension    Denies   Hyponatremia 05/02/2016   Incomplete left bundle branch block (LBBB) 2016   Noted on EKG   INSOMNIA    Lumbosacral disc  disease    Chronic pain; History of osteomyelitis 2010 following ESI complication   Obesity    Osteomyelitis ([facility]CC)    Pelvic fracture ([facility])    Pneumonia    Recurrent UTI 08/23/2015   URINARY URGENCY    Ventral hernia without obstruction or gangrene 05/22/2018   VITAMIN B12 DEFICIENCY    Past Surgical History:  Procedure Laterality Date   BACK SURGERY  1994   for ruptured disc   CHOLECYSTECTOMY     COLONOSCOPY     greenfield filter     ileojejunal bypass  1976   for obesity   LAPAROTOMY N/A 09/18/2014   Procedure: EXPLORATORY LAPAROTOMY WITH REPAIR OF CECIAL PERFORATION;  Surgeon: Autumn Messing III, MD;  Location: [facility];  Service: General;  Laterality: N/A;   TONSILLECTOMY     TOTAL KNEE ARTHROPLASTY Right 08/26/2019   Procedure: RIGHT TOTAL KNEE ARTHROPLASTY, LEFT KNEE CORTISONE INJECTION;  Surgeon: Paralee Cancel, MD;  Location: [facility];  Service: Orthopedics;  Laterality: Right;  70 MINS   TOTAL KNEE ARTHROPLASTY Left 01/25/2021   Procedure: TOTAL KNEE ARTHROPLASTY;  Surgeon: Paralee Cancel, MD;  [facility];  Service: Orthopedics;  Laterality: Left;   UPPER GI ENDOSCOPY     VASCULAR SURGERY Right    revascularization right lower extremity   Social History   Social History Narrative   Lives with spouse, Indep ADLs   Supportive family nearby   No dietary  restrictions   family history includes Cervical cancer in her mother; Cirrhosis in her sister; Kidney disease in her sister; Liver disease in her sister; Stroke in her mother.   Review of Systems C spine Fx 7/23 after fall - managed non-operatively Recent right shldr injection for pain and "torn tendons" - no surgery  Objective:   Physical Exam BP 116/64   Pulse 82   Ht '5\' 5"'$  (1.651 m)   Wt 177 lb (80.3 kg)   BMI 29.45 kg/m  NAD Abd soft NT no mass

## 2022-04-19 NOTE — Patient Instructions (Signed)
Great to see you today.  We have placed a referral for pelvic floor P.T. They will contact you to set this up.   Due to recent changes in healthcare laws, you may see the results of your imaging and laboratory studies on MyChart before your provider has had a chance to review them.  We understand that in some cases there may be results that are confusing or concerning to you. Not all laboratory results come back in the same time frame and the provider may be waiting for multiple results in order to interpret others.  Please give [place] 48 hours in order for your provider to thoroughly review all the results before contacting the office for clarification of your results.   I appreciate the opportunity to care for you. Silvano Rusk, MD, [facility]

## 2022-04-24 ENCOUNTER — Other Ambulatory Visit: Payer: Self-pay | Admitting: Family Medicine

## 2022-04-24 ENCOUNTER — Other Ambulatory Visit (HOSPITAL_COMMUNITY): Payer: Self-pay

## 2022-04-24 ENCOUNTER — Other Ambulatory Visit: Payer: Self-pay | Admitting: Internal Medicine

## 2022-04-24 ENCOUNTER — Other Ambulatory Visit: Payer: Self-pay

## 2022-04-24 DIAGNOSIS — M25511 Pain in right shoulder: Secondary | ICD-10-CM | POA: Diagnosis not present

## 2022-04-24 MED ORDER — CYANOCOBALAMIN 1000 MCG/ML IJ SOLN
1000.0000 ug | INTRAMUSCULAR | 1 refills | Status: DC
  Filled 2022-04-24: qty 3, 90d supply, fill #0
  Filled 2023-01-05: qty 3, 90d supply, fill #1

## 2022-04-25 ENCOUNTER — Other Ambulatory Visit: Payer: Self-pay

## 2022-04-25 ENCOUNTER — Other Ambulatory Visit (HOSPITAL_COMMUNITY): Payer: Self-pay

## 2022-04-25 MED ORDER — POTASSIUM CHLORIDE CRYS ER 20 MEQ PO TBCR
20.0000 meq | EXTENDED_RELEASE_TABLET | Freq: Every day | ORAL | 1 refills | Status: DC
  Filled 2022-04-25: qty 90, 90d supply, fill #0
  Filled 2022-07-28: qty 90, 90d supply, fill #1

## 2022-05-01 ENCOUNTER — Other Ambulatory Visit (HOSPITAL_COMMUNITY): Payer: Self-pay

## 2022-05-02 ENCOUNTER — Other Ambulatory Visit: Payer: Self-pay

## 2022-05-04 DIAGNOSIS — M25511 Pain in right shoulder: Secondary | ICD-10-CM | POA: Diagnosis not present

## 2022-05-05 ENCOUNTER — Other Ambulatory Visit (HOSPITAL_COMMUNITY): Payer: Self-pay

## 2022-05-08 ENCOUNTER — Other Ambulatory Visit (HOSPITAL_COMMUNITY): Payer: Self-pay

## 2022-05-11 DIAGNOSIS — M25511 Pain in right shoulder: Secondary | ICD-10-CM | POA: Diagnosis not present

## 2022-05-12 ENCOUNTER — Other Ambulatory Visit (HOSPITAL_COMMUNITY): Payer: Self-pay

## 2022-05-12 ENCOUNTER — Other Ambulatory Visit: Payer: Self-pay

## 2022-05-12 MED ORDER — LEVOCETIRIZINE DIHYDROCHLORIDE 5 MG PO TABS
5.0000 mg | ORAL_TABLET | Freq: Every evening | ORAL | 2 refills | Status: DC
  Filled 2022-05-12: qty 90, 90d supply, fill #0
  Filled 2022-08-09: qty 90, 90d supply, fill #1
  Filled 2022-11-08: qty 90, 90d supply, fill #2

## 2022-05-19 ENCOUNTER — Other Ambulatory Visit (HOSPITAL_BASED_OUTPATIENT_CLINIC_OR_DEPARTMENT_OTHER): Payer: Self-pay

## 2022-05-19 ENCOUNTER — Ambulatory Visit: Payer: Medicare HMO | Admitting: Family

## 2022-05-19 ENCOUNTER — Ambulatory Visit (INDEPENDENT_AMBULATORY_CARE_PROVIDER_SITE_OTHER): Payer: Medicare HMO | Admitting: Family

## 2022-05-19 VITALS — BP 120/68 | HR 75 | Temp 97.7°F | Resp 16 | Wt 177.0 lb

## 2022-05-19 DIAGNOSIS — N3000 Acute cystitis without hematuria: Secondary | ICD-10-CM

## 2022-05-19 LAB — POC URINALSYSI DIPSTICK (AUTOMATED)
Bilirubin, UA: NEGATIVE
Blood, UA: NEGATIVE
Glucose, UA: NEGATIVE
Ketones, UA: NEGATIVE
Nitrite, UA: POSITIVE
Protein, UA: POSITIVE — AB
Spec Grav, UA: <=1.005 — AB (ref 1.010–1.025)
Urobilinogen, UA: 0.2 E.U./dL
pH, UA: 6 (ref 5.0–8.0)

## 2022-05-19 MED ORDER — CEPHALEXIN 500 MG PO CAPS
500.0000 mg | ORAL_CAPSULE | Freq: Two times a day (BID) | ORAL | 0 refills | Status: DC
  Filled 2022-05-19: qty 14, 7d supply, fill #0

## 2022-05-19 NOTE — Progress Notes (Signed)
Subjective:   By signing my name below, I, Alexis Olson, attest that this documentation has been prepared under the direction and in the presence of Nance Pear, NP 05/19/22   Patient ID: Alexis Olson, female    DOB: 01/31/1934, 89 y.o.   MRN: [id number]  Chief Complaint  Patient presents with   Dysuria    Complains of burning with urination    Dysuria  Associated symptoms include frequency.   Patient is in today for an office visit.   UTI: She endorses dysuria and frequency beginning Tuesday with symptoms worsening since then. She denies any hematuria or unusual back pain. She reports she has had many UTIs before but notes it is not that often in between.   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 difficile diarrhea    Colon perforation ([facility]) 08/2014   following polypetomy during colo, surgical repair   COLONIC POLYPS, ADENOMATOUS, HX OF 2010   DIVERTICULOSIS, COLON    DVT (deep venous thrombosis) ([place]) 2018   DVT (deep venous thrombosis) (HCC)    GERD (gastroesophageal reflux disease) 08/23/2015   Graded compression stocking in place    History of blood transfusion    History of migraine    History of rheumatic fever    HSV (herpes simplex virus) infection 08/16/2015   Hyperlipidemia    Hypertension    Denies   Hyponatremia 05/02/2016   Incomplete left bundle branch block (LBBB) 2016   Noted on EKG   INSOMNIA    Lumbosacral disc disease    Chronic pain; History of osteomyelitis 2010 following ESI complication   Obesity    Osteomyelitis (HCC)    Pelvic fracture (HCC)    Pneumonia    Recurrent UTI 08/23/2015   URINARY URGENCY    Ventral hernia without obstruction or gangrene 05/22/2018   VITAMIN B12 DEFICIENCY     Past Surgical History:  Procedure  Laterality Date   BACK SURGERY  1994   for ruptured disc   CHOLECYSTECTOMY     COLONOSCOPY     greenfield filter     ileojejunal bypass  1976   for obesity   LAPAROTOMY N/A 09/18/2014   Procedure: EXPLORATORY LAPAROTOMY WITH REPAIR OF CECIAL PERFORATION;  Surgeon: Autumn Messing III, MD;  Location: [facility];  Service: General;  Laterality: N/A;   TONSILLECTOMY     TOTAL KNEE ARTHROPLASTY Right 08/26/2019   Procedure: RIGHT TOTAL KNEE ARTHROPLASTY, LEFT KNEE CORTISONE INJECTION;  Surgeon: Paralee Cancel, MD;  Location: [facility];  Service: Orthopedics;  Laterality: Right;  70 MINS   TOTAL KNEE ARTHROPLASTY Left 01/25/2021   Procedure: TOTAL KNEE ARTHROPLASTY;  Surgeon: Paralee Cancel, MD;  Location: [facility];  Service: Orthopedics;  Laterality: Left;   UPPER GI ENDOSCOPY     VASCULAR SURGERY Right    revascularization right lower extremity    Family History  Problem Relation Age of Onset   Cervical cancer Mother    Stroke Mother    Liver disease Sister    Kidney disease Sister    Cirrhosis Sister    Colon cancer Neg Hx    Esophageal cancer Neg Hx    Rectal cancer Neg Hx    Stomach cancer Neg Hx  Social History   Socioeconomic History   Marital status: Married    Spouse name: Not on file   Number of children: 3   Years of education: Not on file   Highest education level: Not on file  Occupational History   Occupation: retired Programmer, multimedia: RETIRED  Tobacco Use   Smoking status: Never   Smokeless tobacco: Never  Vaping Use   Vaping Use: Never used  Substance and Sexual Activity   Alcohol use: No    Alcohol/week: 0.0 standard drinks of alcohol   Drug use: No   Sexual activity: Never    Partners: Male  Other Topics Concern   Not on file  Social History Narrative   Lives with spouse, Indep ADLs   Supportive family nearby   No dietary restrictions   Social Determinants of Health   Financial Resource Strain: Low Risk  (07/28/2021)   Overall Financial Resource Strain (CARDIA)     Difficulty of Paying Living Expenses: Not hard at all  Food Insecurity: No Food Insecurity (12/12/2021)   Hunger Vital Sign    Worried About Running Out of Food in the Last Year: Never true    [place] in the Last Year: Never true  Transportation Needs: No Transportation Needs (12/12/2021)   PRAPARE - Hydrologist (Medical): No    Lack of Transportation (Non-Medical): No  Physical Activity: Insufficiently Active (07/28/2021)   Exercise Vital Sign    Days of Exercise per Week: 3 days    Minutes of Exercise per Session: 30 min  Stress: No Stress Concern Present (12/12/2021)   [place]    Feeling of Stress : Not at all  Social Connections: [place] (07/28/2021)   Social Connection and Isolation Panel [NHANES]    Frequency of Communication with Friends and Family: More than three times a week    Frequency of Social Gatherings with Friends and Family: More than three times a week    Attends Religious Services: More than 4 times per year    Active Member of [employer] or Organizations: Yes    Attends Music therapist: More than 4 times per year    Marital Status: Married  Human resources officer Violence: Not At Risk (07/28/2021)   Humiliation, Afraid, Rape, and Kick questionnaire    Fear of Current or Ex-Partner: No    Emotionally Abused: No    Physically Abused: No    Sexually Abused: No    Outpatient Medications Prior to Visit  Medication Sig Dispense Refill   acetaminophen (TYLENOL) 500 MG tablet Take 2 tablets (1,000 mg total) by mouth every 8 (eight) hours. (Patient taking differently: Take 1,000 mg by mouth every 8 (eight) hours as needed for mild pain.) 30 tablet 0   albuterol (PROVENTIL HFA;VENTOLIN HFA) 108 (90 BASE) MCG/ACT inhaler Inhale 1-2 puffs into the lungs every 6 (six) hours as needed for wheezing or shortness of breath.     ALPRAZolam (XANAX) 0.5 MG tablet  Take 1 tablet by mouth at bedtime as needed for anxiety. 30 tablet 3   amitriptyline (ELAVIL) 100 MG tablet Take 1 tablet (100 mg total) by mouth daily. 90 tablet 1   amoxicillin (AMOXIL) 500 MG capsule Take 4 capsules by mouth 1 hour prior to dental work 4 capsule 4   apixaban (ELIQUIS) 5 MG TABS tablet Take 1 tablet (5 mg total) by mouth every 12 (twelve) hours. [place]  tablet 3   azelastine (OPTIVAR) 0.05 % ophthalmic solution Place 1 drop into both eyes daily as needed (for dry eyes).     budesonide-formoterol (SYMBICORT) 160-4.5 MCG/ACT inhaler Inhale 2 puffs into the lungs See admin instructions. Inhale 2 puffs into the lungs one to two times a day 1 Inhaler 2   cetirizine (ZYRTEC) 10 MG tablet Take 10 mg by mouth daily.      cyanocobalamin (DODEX) 1000 MCG/ML injection Inject 1 mL (1,000 mcg total) into the muscle every 30 (thirty) days. 10 mL 1   dicyclomine (BENTYL) 20 MG tablet Take 1/2 to 1 tablet by mouth 3 times daily as needed. 90 tablet 3   diphenoxylate-atropine (LOMOTIL) 2.5-0.025 MG tablet Take 1 to 2 tablets by mouth up to 4 times a day 180 tablet 1   docusate sodium (COLACE) 100 MG capsule Take 1 capsule (100 mg total) by mouth 2 (two) times daily. 10 capsule 0   Fe Fum-FA-B Cmp-C-Zn-Mg-Mn-Cu (HEMOCYTE PLUS) 106-1 MG CAPS TAKE 1 CAPSULE BY MOUTH TWO TIMES DAILY 60 capsule 1   fluticasone (FLONASE) 50 MCG/ACT nasal spray Place 1 to 2 sprays in each nostril once a day 16 g 3   gabapentin (NEURONTIN) 300 MG capsule Take 1 capsule (300 mg total) by mouth at bedtime. 90 capsule 1   hydrOXYzine (ATARAX) 25 MG tablet Take 1 tablet (25 mg total) by mouth 3 (three) times daily. 90 tablet 6   levocetirizine (XYZAL) 5 MG tablet Take 1 tablet (5 mg total) by mouth every evening. 90 tablet 2   magic mouthwash (nystatin, hydrocortisone, diphenhydrAMINE) suspension Swish, gargle and spit 5 to 10 mLs 4 times daily as needed. 160 mL 1   magnesium oxide (MAG-OX) 400 MG tablet Take 400 mg by mouth 2  (two) times daily.     methocarbamol (ROBAXIN) 500 MG tablet Take 1 tablet (500 mg total) by mouth 3 (three) times daily as needed. 60 tablet 0   metoprolol succinate (TOPROL XL) 25 MG 24 hr tablet Take 1 tablet (25 mg total) by mouth daily. 90 tablet 1   montelukast (SINGULAIR) 10 MG tablet Take 1 tablet (10 mg total) by mouth every evening. 90 tablet 2   omeprazole (PRILOSEC) 20 MG capsule Take 1 capsule (20 mg total) by mouth daily. 90 capsule 0   ondansetron (ZOFRAN-ODT) 4 MG disintegrating tablet Take 1 tablet (4 mg total) by mouth 3 (three) times daily as needed for nausea or vomiting. 20 tablet 0   phenazopyridine (PYRIDIUM) 100 MG tablet Take 1 tablet (100 mg total) by mouth 3 (three) times daily as needed for pain. 20 tablet 0   potassium chloride SA (KLOR-CON M) 20 MEQ tablet Take 1 tablet (20 mEq total) by mouth daily. 90 tablet 1   Probiotic Product (PROBIOTIC DAILY PO) Take 1 capsule by mouth daily.     Spacer/Aero-Holding Chambers (AEROCHAMBER PLUS WITH MASK) inhaler Use as directed 1 each 1   terconazole (TERAZOL 7) 0.4 % vaginal cream Insert 1 applicatorful vaginally at bedtime for 7 nights.  Apply externally as well nightly. 45 g 0   triamcinolone (KENALOG) 0.025 % ointment Apply 1 application topically 2 (two) times daily. 30 g 0   Vitamin D, Ergocalciferol, (DRISDOL) 1.25 MG (50000 UNIT) CAPS capsule Take 1 capsule (50,000 Units total) by mouth every 7 (seven) days for 12 weeks. 12 capsule 0   zolpidem (AMBIEN) 5 MG tablet Take 1 tablet (5 mg total) by mouth at bedtime. 90 tablet 1  No facility-administered medications prior to visit.    Allergies  Allergen Reactions   Ativan [Lorazepam] Anaphylaxis and Other (See Comments)    Deathly allergic; "resp rate dropped dropped to 6"   Other Anaphylaxis    Red peppers--choking, also   Azithromycin Diarrhea   Benadryl [Diphenhydramine Hcl] Other (See Comments)    Paradoxical response   Ciprofloxacin Hcl Other (See Comments)     Causes yeast infection and refuses to take   Codeine Phosphate Nausea Only    Can tolerate in limited amounts   Hydrocodone Itching and Nausea And Vomiting   Hydrocodone Bit-Homatrop Mbr Itching and Nausea And Vomiting   Levaquin [Levofloxacin In D5w] Other (See Comments)    Muscle soreness   Meperidine Hcl Other (See Comments)    Reaction ??   Molds & Smuts Other (See Comments)    unsure   Sulfa Antibiotics Other (See Comments)    Joint pain   Tape Other (See Comments)    No Coban wrap (per the patient)   Ultram [Tramadol Hcl] Other (See Comments)    "Can't move joints"    Review of Systems  Genitourinary:  Positive for dysuria and frequency.       Objective:    Physical Exam Constitutional:      General: She is awake. She is not in acute distress.    Appearance: Normal appearance. She is well-developed.  HENT:     Head: Normocephalic and atraumatic.     Right Ear: External ear normal.     Left Ear: External ear normal.  Eyes:     General: No scleral icterus. Neck:     Thyroid: No thyromegaly.  Cardiovascular:     Rate and Rhythm: Normal rate and regular rhythm.     Heart sounds: Normal heart sounds. No murmur heard. Pulmonary:     Effort: Pulmonary effort is normal. No respiratory distress.     Breath sounds: Normal breath sounds. No wheezing.  Abdominal:     Tenderness: There is no abdominal tenderness. There is no right CVA tenderness or left CVA tenderness.  Musculoskeletal:     Cervical back: Neck supple.  Skin:    General: Skin is warm and dry.  Neurological:     Mental Status: She is alert and oriented to person, place, and time.     Cranial Nerves: No facial asymmetry.  Psychiatric:        Mood and Affect: Mood normal.        Speech: Speech normal.        Behavior: Behavior normal.        Thought Content: Thought content normal.        Judgment: Judgment normal.     BP 120/68 (BP Location: Right Arm, Patient Position: Sitting, Cuff Size: Small)    Pulse 75   Temp 97.7 F (36.5 C) (Oral)   Resp 16   Wt 177 lb (80.3 kg)   SpO2 100%   BMI 29.45 kg/m  Wt Readings from Last 3 Encounters:  05/19/22 177 lb (80.3 kg)  04/19/22 177 lb (80.3 kg)  03/01/22 175 lb 3.2 oz (79.5 kg)       Assessment & Plan:  Acute cystitis without hematuria -     POCT Urinalysis Dipstick (Automated) -     Urine Culture  Other orders -     Cephalexin; Take 1 capsule (500 mg total) by mouth 2 (two) times daily.  Dispense: 14 capsule; Refill: 0  Symptoms and UA suggestive  of UTI.  Multiple antibiotic allergies. Will treat with cephalexin while we await culture results. Pt is advised to call if symptoms worsen or if symptoms fail to improve.    I,Rachel Rivera,acting as a Education administrator for Nance Pear, NP.,have documented all relevant documentation on the behalf of Nance Pear, NP,as directed by  Nance Pear, NP while in the presence of Nance Pear, NP.   I, Nance Pear, NP, personally preformed the services described in this documentation.  All medical record entries made by the scribe were at my direction and in my presence.  I have reviewed the chart and discharge instructions (if applicable) and agree that the record reflects my personal performance and is accurate and complete. 05/19/22   Nance Pear, NP

## 2022-05-22 ENCOUNTER — Other Ambulatory Visit (HOSPITAL_COMMUNITY): Payer: Self-pay

## 2022-05-22 ENCOUNTER — Telehealth: Payer: Self-pay | Admitting: Family

## 2022-05-22 ENCOUNTER — Other Ambulatory Visit (HOSPITAL_BASED_OUTPATIENT_CLINIC_OR_DEPARTMENT_OTHER): Payer: Self-pay

## 2022-05-22 DIAGNOSIS — N9089 Other specified noninflammatory disorders of vulva and perineum: Secondary | ICD-10-CM

## 2022-05-22 LAB — URINE CULTURE
MICRO NUMBER:: 14607141
SPECIMEN QUALITY:: ADEQUATE

## 2022-05-22 MED ORDER — TETRACYCLINE HCL 500 MG PO CAPS
500.0000 mg | ORAL_CAPSULE | Freq: Four times a day (QID) | ORAL | 0 refills | Status: DC
  Filled 2022-05-22 (×2): qty 12, 3d supply, fill #0

## 2022-05-22 NOTE — Telephone Encounter (Signed)
Pt called to advise that insurance is not paying for new antibiotic and pharmacy recommended she call us to find out if there is another medication that she can take. Please advise pt.

## 2022-05-22 NOTE — Telephone Encounter (Signed)
Patient advised of results and change in prescription

## 2022-05-22 NOTE — Telephone Encounter (Signed)
Based on her urine culture results, I would like for her to stop keflex and begin tetracycline.  Rx has been sent to [facility]

## 2022-05-23 ENCOUNTER — Other Ambulatory Visit: Payer: Self-pay

## 2022-05-23 ENCOUNTER — Other Ambulatory Visit (HOSPITAL_BASED_OUTPATIENT_CLINIC_OR_DEPARTMENT_OTHER): Payer: Self-pay

## 2022-05-23 ENCOUNTER — Other Ambulatory Visit: Payer: Self-pay | Admitting: Internal Medicine

## 2022-05-23 ENCOUNTER — Other Ambulatory Visit (HOSPITAL_COMMUNITY): Payer: Self-pay

## 2022-05-23 MED ORDER — DICYCLOMINE HCL 20 MG PO TABS
10.0000 mg | ORAL_TABLET | Freq: Three times a day (TID) | ORAL | 3 refills | Status: DC | PRN
  Filled 2022-05-23 – 2022-05-31 (×2): qty 90, 30d supply, fill #0

## 2022-05-23 MED ORDER — TERCONAZOLE 0.4 % VA CREA
1.0000 | TOPICAL_CREAM | Freq: Every day | VAGINAL | 0 refills | Status: DC
  Filled 2022-05-23: qty 45, 7d supply, fill #0

## 2022-05-23 MED ORDER — CIPROFLOXACIN HCL 250 MG PO TABS
250.0000 mg | ORAL_TABLET | Freq: Two times a day (BID) | ORAL | 0 refills | Status: AC
  Filled 2022-05-23: qty 10, 5d supply, fill #0

## 2022-05-23 NOTE — Telephone Encounter (Signed)
Spoke with pt re: culture results and limited antibiotic options. She is will to take cipro if we treat her for yeast infection as this causes yeast infection. Rx sent for cipro as well as terazol-7.

## 2022-05-23 NOTE — Telephone Encounter (Signed)
Caller Name [place] Phone Number [phone] Patient Name Alexis Olson Patient DOB December 07, 1933 Call Type Message Only Information Provided Reason for Call Request for General Office Information Initial Comment Caller states her insurance is refusing to pay for her rx. Additional Comment Provide Hours Disp. Time Disposition Final User 05/22/2022 5:24:16 PM General Information Provided Yes Bernell List Call Closed By: Bernell List Transaction Date/Time: 05/22/2022 5:21:31 PM (ET)

## 2022-05-23 NOTE — Addendum Note (Signed)
Addended by: Debbrah Alar on: 05/23/2022 09:39 AM   Modules accepted: Orders

## 2022-05-24 NOTE — Telephone Encounter (Signed)
Opened in error

## 2022-05-25 ENCOUNTER — Other Ambulatory Visit: Payer: Self-pay

## 2022-05-25 ENCOUNTER — Other Ambulatory Visit (HOSPITAL_COMMUNITY): Payer: Self-pay

## 2022-05-26 ENCOUNTER — Other Ambulatory Visit: Payer: Self-pay

## 2022-05-29 DIAGNOSIS — M25511 Pain in right shoulder: Secondary | ICD-10-CM | POA: Diagnosis not present

## 2022-05-30 ENCOUNTER — Other Ambulatory Visit: Payer: Self-pay

## 2022-05-31 ENCOUNTER — Other Ambulatory Visit (HOSPITAL_COMMUNITY): Payer: Self-pay

## 2022-06-05 ENCOUNTER — Other Ambulatory Visit (HOSPITAL_COMMUNITY): Payer: Self-pay

## 2022-06-09 DIAGNOSIS — M25511 Pain in right shoulder: Secondary | ICD-10-CM | POA: Diagnosis not present

## 2022-06-13 ENCOUNTER — Telehealth: Payer: Self-pay | Admitting: Internal Medicine

## 2022-06-13 MED ORDER — DICYCLOMINE HCL 20 MG PO TABS
10.0000 mg | ORAL_TABLET | Freq: Three times a day (TID) | ORAL | 3 refills | Status: DC | PRN
  Filled 2022-06-13: qty 90, 30d supply, fill #0
  Filled 2023-01-01: qty 90, 30d supply, fill #1

## 2022-06-13 NOTE — Telephone Encounter (Signed)
Bentyl refilled as requested.  Mechele Claude informed.

## 2022-06-13 NOTE — Telephone Encounter (Signed)
Patient called requesting a refill on Bentyl states she has run out of it.

## 2022-06-14 ENCOUNTER — Other Ambulatory Visit (HOSPITAL_COMMUNITY): Payer: Self-pay

## 2022-06-14 ENCOUNTER — Other Ambulatory Visit: Payer: Self-pay

## 2022-06-17 ENCOUNTER — Other Ambulatory Visit (HOSPITAL_COMMUNITY): Payer: Self-pay

## 2022-06-19 ENCOUNTER — Other Ambulatory Visit (HOSPITAL_COMMUNITY): Payer: Self-pay

## 2022-06-19 ENCOUNTER — Other Ambulatory Visit: Payer: Self-pay

## 2022-06-19 NOTE — Assessment & Plan Note (Signed)
Encouraged good sleep hygiene such as dark, quiet room. No blue/green glowing lights such as computer screens in bedroom. No alcohol or stimulants in evening. Cut down on caffeine as able. Regular exercise is helpful but not just prior to bed time.  Amitriptyline prn

## 2022-06-19 NOTE — Assessment & Plan Note (Signed)
Rte controled tolerating meds

## 2022-06-19 NOTE — Assessment & Plan Note (Signed)
Hydrate and monitor 

## 2022-06-19 NOTE — Assessment & Plan Note (Signed)
Supplement and monitor 

## 2022-06-20 ENCOUNTER — Ambulatory Visit (INDEPENDENT_AMBULATORY_CARE_PROVIDER_SITE_OTHER): Payer: Medicare HMO | Admitting: Family Medicine

## 2022-06-20 ENCOUNTER — Telehealth: Payer: Self-pay | Admitting: Internal Medicine

## 2022-06-20 ENCOUNTER — Encounter: Payer: Self-pay | Admitting: Family Medicine

## 2022-06-20 VITALS — BP 122/68 | HR 81 | Temp 98.0°F | Resp 16 | Ht 65.0 in | Wt 182.8 lb

## 2022-06-20 DIAGNOSIS — J4530 Mild persistent asthma, uncomplicated: Secondary | ICD-10-CM

## 2022-06-20 DIAGNOSIS — R739 Hyperglycemia, unspecified: Secondary | ICD-10-CM

## 2022-06-20 DIAGNOSIS — G4700 Insomnia, unspecified: Secondary | ICD-10-CM

## 2022-06-20 DIAGNOSIS — M109 Gout, unspecified: Secondary | ICD-10-CM

## 2022-06-20 DIAGNOSIS — I4891 Unspecified atrial fibrillation: Secondary | ICD-10-CM | POA: Diagnosis not present

## 2022-06-20 DIAGNOSIS — E538 Deficiency of other specified B group vitamins: Secondary | ICD-10-CM

## 2022-06-20 DIAGNOSIS — R350 Frequency of micturition: Secondary | ICD-10-CM | POA: Diagnosis not present

## 2022-06-20 DIAGNOSIS — M810 Age-related osteoporosis without current pathological fracture: Secondary | ICD-10-CM | POA: Diagnosis not present

## 2022-06-20 NOTE — Progress Notes (Signed)
Subjective:   By signing my name below, I, Kellie Simmering, attest that this documentation has been prepared under the direction and in the presence of Mosie Lukes, MD., 06/20/2022.   Patient ID: Alexis Olson, female    DOB: [birth year], 89 y.o.   MRN: [id number]  Chief Complaint  Patient presents with   Follow-up    Follow up   HPI Patient is in today for an office visit and is accompanied by her husband.  Bowel Movements Patient reports that she is using Dicyclomine 20 mg as needed to assist her bowel movements.   Cervical Fracture Patient reports that she fell in 09/2021 and sustained a C2 transverse process fracture. She is doing well since the fall and states that the only residual pain she feels is when she sleeps and applies to the neck.  Fatigue Patient complains of constant fatigue and states that she has difficulty falling asleep at night. She denies CP/palpitations/SOB/HA /fever/chills/GI or GU symptoms.  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 difficile diarrhea    Colon perforation ([place]) 08/2014   following polypetomy during colo, surgical repair   COLONIC POLYPS, ADENOMATOUS, HX OF 2010   DIVERTICULOSIS, COLON    DVT (deep venous thrombosis) ([place]) 2018   DVT (deep venous thrombosis) (HCC)    GERD (gastroesophageal reflux disease) 08/23/2015   Graded compression stocking in place    History of blood transfusion    History of migraine    History of rheumatic fever    HSV (herpes simplex virus) infection 08/16/2015   Hyperlipidemia    Hypertension    Denies   Hyponatremia 05/02/2016   Incomplete left bundle branch block (LBBB) 2016   Noted on EKG   INSOMNIA    Lumbosacral disc disease    Chronic pain; History of osteomyelitis 2010 following ESI complication    Obesity    Osteomyelitis (HCC)    Pelvic fracture (HCC)    Pneumonia    Recurrent UTI 08/23/2015   URINARY URGENCY    Ventral hernia without obstruction or gangrene 05/22/2018   VITAMIN B12 DEFICIENCY     Past Surgical History:  Procedure Laterality Date   BACK SURGERY  1994   for ruptured disc   CHOLECYSTECTOMY     COLONOSCOPY     greenfield filter     ileojejunal bypass  1976   for obesity   LAPAROTOMY N/A 09/18/2014   Procedure: EXPLORATORY LAPAROTOMY WITH REPAIR OF CECIAL PERFORATION;  Surgeon: Autumn Messing III, MD;  Location: [facility];  Service: General;  Laterality: N/A;   TONSILLECTOMY     TOTAL KNEE ARTHROPLASTY Right 08/26/2019   Procedure: RIGHT TOTAL KNEE ARTHROPLASTY, LEFT KNEE CORTISONE INJECTION;  Surgeon: Paralee Cancel, MD;  Location: [facility];  Service: Orthopedics;  Laterality: Right;  70 MINS   TOTAL KNEE ARTHROPLASTY Left 01/25/2021   Procedure: TOTAL KNEE ARTHROPLASTY;  Surgeon: Paralee Cancel, MD;  Location: [facility];  Service: Orthopedics;  Laterality: Left;   UPPER GI ENDOSCOPY     VASCULAR SURGERY Right    revascularization right lower extremity    Family History  Problem Relation Age of Onset   Cervical cancer Mother    Stroke Mother    Liver disease  Sister    Kidney disease Sister    Cirrhosis Sister    Colon cancer Neg Hx    Esophageal cancer Neg Hx    Rectal cancer Neg Hx    Stomach cancer Neg Hx     Social History   Socioeconomic History   Marital status: Married    Spouse name: Not on file   Number of children: 3   Years of education: Not on file   Highest education level: Not on file  Occupational History   Occupation: retired Programmer, multimedia: RETIRED  Tobacco Use   Smoking status: Never   Smokeless tobacco: Never  Vaping Use   Vaping Use: Never used  Substance and Sexual Activity   Alcohol use: No    Alcohol/week: 0.0 standard drinks of alcohol   Drug use: No   Sexual activity: Never    Partners: Male  Other Topics Concern   Not on  file  Social History Narrative   Lives with spouse, Indep ADLs   Supportive family nearby   No dietary restrictions   Social Determinants of Health   Financial Resource Strain: Low Risk  (07/28/2021)   Overall Financial Resource Strain (CARDIA)    Difficulty of Paying Living Expenses: Not hard at all  Food Insecurity: No Food Insecurity (12/12/2021)   Hunger Vital Sign    Worried About Running Out of Food in the Last Year: Never true    [place] in the Last Year: Never true  Transportation Needs: No Transportation Needs (12/12/2021)   PRAPARE - Hydrologist (Medical): No    Lack of Transportation (Non-Medical): No  Physical Activity: Insufficiently Active (07/28/2021)   Exercise Vital Sign    Days of Exercise per Week: 3 days    Minutes of Exercise per Session: 30 min  Stress: No Stress Concern Present (12/12/2021)   [place]    Feeling of Stress : Not at all  Social Connections: [place] (07/28/2021)   Social Connection and Isolation Panel [NHANES]    Frequency of Communication with Friends and Family: More than three times a week    Frequency of Social Gatherings with Friends and Family: More than three times a week    Attends Religious Services: More than 4 times per year    Active Member of [employer] or Organizations: Yes    Attends Music therapist: More than 4 times per year    Marital Status: Married  Human resources officer Violence: Not At Risk (07/28/2021)   Humiliation, Afraid, Rape, and Kick questionnaire    Fear of Current or Ex-Partner: No    Emotionally Abused: No    Physically Abused: No    Sexually Abused: No    Outpatient Medications Prior to Visit  Medication Sig Dispense Refill   acetaminophen (TYLENOL) 500 MG tablet Take 2 tablets (1,000 mg total) by mouth every 8 (eight) hours. (Patient taking differently: Take 1,000 mg by mouth every 8 (eight)  hours as needed for mild pain.) 30 tablet 0   albuterol (PROVENTIL HFA;VENTOLIN HFA) 108 (90 BASE) MCG/ACT inhaler Inhale 1-2 puffs into the lungs every 6 (six) hours as needed for wheezing or shortness of breath.     ALPRAZolam (XANAX) 0.5 MG tablet Take 1 tablet by mouth at bedtime as needed for anxiety. 30 tablet 3   amitriptyline (ELAVIL) 100 MG tablet Take 1 tablet (100 mg total) by mouth daily. [place]  tablet 1   apixaban (ELIQUIS) 5 MG TABS tablet Take 1 tablet (5 mg total) by mouth every 12 (twelve) hours. 180 tablet 3   azelastine (OPTIVAR) 0.05 % ophthalmic solution Place 1 drop into both eyes daily as needed (for dry eyes).     budesonide-formoterol (SYMBICORT) 160-4.5 MCG/ACT inhaler Inhale 2 puffs into the lungs See admin instructions. Inhale 2 puffs into the lungs one to two times a day 1 Inhaler 2   cetirizine (ZYRTEC) 10 MG tablet Take 10 mg by mouth daily.      cyanocobalamin (DODEX) 1000 MCG/ML injection Inject 1 mL (1,000 mcg total) into the muscle every 30 (thirty) days. 10 mL 1   dicyclomine (BENTYL) 20 MG tablet Take 0.5-1 tablets (10-20 mg total) by mouth 3 (three) times daily as needed. 90 tablet 3   diphenoxylate-atropine (LOMOTIL) 2.5-0.025 MG tablet Take 1 to 2 tablets by mouth up to 4 times a day 180 tablet 1   docusate sodium (COLACE) 100 MG capsule Take 1 capsule (100 mg total) by mouth 2 (two) times daily. 10 capsule 0   Fe Fum-FA-B Cmp-C-Zn-Mg-Mn-Cu (HEMOCYTE PLUS) 106-1 MG CAPS TAKE 1 CAPSULE BY MOUTH TWO TIMES DAILY 60 capsule 1   fluticasone (FLONASE) 50 MCG/ACT nasal spray Place 1 to 2 sprays in each nostril once a day 16 g 3   gabapentin (NEURONTIN) 300 MG capsule Take 1 capsule (300 mg total) by mouth at bedtime. 90 capsule 1   hydrOXYzine (ATARAX) 25 MG tablet Take 1 tablet (25 mg total) by mouth 3 (three) times daily. 90 tablet 6   levocetirizine (XYZAL) 5 MG tablet Take 1 tablet (5 mg total) by mouth every evening. 90 tablet 2   magic mouthwash (nystatin,  hydrocortisone, diphenhydrAMINE) suspension Swish, gargle and spit 5 to 10 mLs 4 times daily as needed. 160 mL 1   magnesium oxide (MAG-OX) 400 MG tablet Take 400 mg by mouth 2 (two) times daily.     methocarbamol (ROBAXIN) 500 MG tablet Take 1 tablet (500 mg total) by mouth 3 (three) times daily as needed. 60 tablet 0   metoprolol succinate (TOPROL XL) 25 MG 24 hr tablet Take 1 tablet (25 mg total) by mouth daily. 90 tablet 1   montelukast (SINGULAIR) 10 MG tablet Take 1 tablet (10 mg total) by mouth every evening. 90 tablet 2   omeprazole (PRILOSEC) 20 MG capsule Take 1 capsule (20 mg total) by mouth daily. 90 capsule 0   ondansetron (ZOFRAN-ODT) 4 MG disintegrating tablet Take 1 tablet (4 mg total) by mouth 3 (three) times daily as needed for nausea or vomiting. 20 tablet 0   phenazopyridine (PYRIDIUM) 100 MG tablet Take 1 tablet (100 mg total) by mouth 3 (three) times daily as needed for pain. 20 tablet 0   potassium chloride SA (KLOR-CON M) 20 MEQ tablet Take 1 tablet (20 mEq total) by mouth daily. 90 tablet 1   Probiotic Product (PROBIOTIC DAILY PO) Take 1 capsule by mouth daily.     Spacer/Aero-Holding Chambers (AEROCHAMBER PLUS WITH MASK) inhaler Use as directed 1 each 1   terconazole (TERAZOL 7) 0.4 % vaginal cream Insert 1 applicatorful vaginally at bedtime for 7 nights.  Apply externally as well nightly. 45 g 0   triamcinolone (KENALOG) 0.025 % ointment Apply 1 application topically 2 (two) times daily. 30 g 0   Vitamin D, Ergocalciferol, (DRISDOL) 1.25 MG (50000 UNIT) CAPS capsule Take 1 capsule (50,000 Units total) by mouth every 7 (seven) days for 12  weeks. 12 capsule 0   zolpidem (AMBIEN) 5 MG tablet Take 1 tablet (5 mg total) by mouth at bedtime. 90 tablet 1   amoxicillin (AMOXIL) 500 MG capsule Take 4 capsules by mouth 1 hour prior to dental work 4 capsule 4   No facility-administered medications prior to visit.    Allergies  Allergen Reactions   Ativan [Lorazepam] Anaphylaxis  and Other (See Comments)    Deathly allergic; "resp rate dropped dropped to 6"   Other Anaphylaxis    Red peppers--choking, also   Azithromycin Diarrhea   Benadryl [Diphenhydramine Hcl] Other (See Comments)    Paradoxical response   Ciprofloxacin Hcl Other (See Comments)    Causes yeast infection and refuses to take   Codeine Phosphate Nausea Only    Can tolerate in limited amounts   Hydrocodone Itching and Nausea And Vomiting   Hydrocodone Bit-[place] Mbr Itching and Nausea And Vomiting   Levaquin [Levofloxacin In D5w] Other (See Comments)    Muscle soreness   Meperidine Hcl Other (See Comments)    Reaction ??   Molds & Smuts Other (See Comments)    unsure   Sulfa Antibiotics Other (See Comments)    Joint pain   Tape Other (See Comments)    No Coban wrap (per the patient)   Ultram [Tramadol Hcl] Other (See Comments)    "Can't move joints"    Review of Systems  Constitutional:  Negative for chills and fever.  Respiratory:  Negative for shortness of breath.   Cardiovascular:  Negative for chest pain and palpitations.  Gastrointestinal:  Positive for diarrhea. Negative for abdominal pain, blood in stool, constipation, nausea and vomiting.  Genitourinary:  Negative for dysuria, frequency, hematuria and urgency.  Skin:           Neurological:  Negative for headaches.       Objective:    Physical Exam Constitutional:      General: She is not in acute distress.    Appearance: Normal appearance. She is normal weight. She is not ill-appearing.  HENT:     Head: Normocephalic and atraumatic.     Right Ear: External ear normal.     Left Ear: External ear normal.     Nose: Nose normal.     Mouth/Throat:     Mouth: Mucous membranes are moist.     Pharynx: Oropharynx is clear.  Eyes:     General:        Right eye: No discharge.        Left eye: No discharge.     Extraocular Movements: Extraocular movements intact.     Conjunctiva/sclera: Conjunctivae normal.     Pupils:  Pupils are equal, round, and reactive to light.  Cardiovascular:     Rate and Rhythm: Normal rate and regular rhythm.     Pulses: Normal pulses.     Heart sounds: Normal heart sounds. No murmur heard.    No gallop.  Pulmonary:     Effort: Pulmonary effort is normal. No respiratory distress.     Breath sounds: Normal breath sounds. No wheezing or rales.  Abdominal:     General: Bowel sounds are normal.     Palpations: Abdomen is soft.     Tenderness: There is no abdominal tenderness. There is no guarding.  Musculoskeletal:        General: Normal range of motion.     Cervical back: Normal range of motion.     Right lower leg: No edema.  Left lower leg: No edema.  Skin:    General: Skin is warm and dry.  Neurological:     Mental Status: She is alert and oriented to person, place, and time.  Psychiatric:        Mood and Affect: Mood normal.        Behavior: Behavior normal.        Judgment: Judgment normal.     BP 122/68 (BP Location: Right Arm, Patient Position: Sitting, Cuff Size: Normal)   Pulse 81   Temp 98 F (36.7 C) (Oral)   Resp 16   Ht 5\' 5"  (1.651 m)   Wt 182 lb 12.8 oz (82.9 kg)   SpO2 94%   BMI 30.42 kg/m  Wt Readings from Last 3 Encounters:  06/20/22 182 lb 12.8 oz (82.9 kg)  05/19/22 177 lb (80.3 kg)  04/19/22 177 lb (80.3 kg)    Diabetic Foot Exam - Simple   No data filed    Lab Results  Component Value Date   WBC 9.0 12/20/2021   HGB 11.8 (L) 12/20/2021   HCT 34.9 (L) 12/20/2021   PLT 293.0 12/20/2021   GLUCOSE 101 (H) 01/05/2022   CHOL 167 12/20/2021   TRIG 161.0 (H) 12/20/2021   HDL 57.80 12/20/2021   LDLDIRECT 76.0 10/04/2018   LDLCALC 77 12/20/2021   ALT 13 01/05/2022   AST 15 01/05/2022   NA 133 (L) 01/05/2022   K 3.6 01/05/2022   CL 98 01/05/2022   CREATININE 0.64 01/05/2022   BUN 14 01/05/2022   CO2 29 01/05/2022   TSH 0.94 12/20/2021   INR 1.1 07/11/2020   HGBA1C 5.3 08/30/2020    Lab Results  Component Value Date    TSH 0.94 12/20/2021   Lab Results  Component Value Date   WBC 9.0 12/20/2021   HGB 11.8 (L) 12/20/2021   HCT 34.9 (L) 12/20/2021   MCV 93.4 12/20/2021   PLT 293.0 12/20/2021   Lab Results  Component Value Date   NA 133 (L) 01/05/2022   K 3.6 01/05/2022   CO2 29 01/05/2022   GLUCOSE 101 (H) 01/05/2022   BUN 14 01/05/2022   CREATININE 0.64 01/05/2022   BILITOT 0.3 01/05/2022   ALKPHOS 56 01/05/2022   AST 15 01/05/2022   ALT 13 01/05/2022   PROT 7.3 01/05/2022   ALBUMIN 4.1 01/05/2022   CALCIUM 9.1 01/05/2022   ANIONGAP 4 (L) 10/23/2021   EGFR 91 12/03/2020   GFR 79.87 01/05/2022   Lab Results  Component Value Date   CHOL 167 12/20/2021   Lab Results  Component Value Date   HDL 57.80 12/20/2021   Lab Results  Component Value Date   LDLCALC 77 12/20/2021   Lab Results  Component Value Date   TRIG 161.0 (H) 12/20/2021   Lab Results  Component Value Date   CHOLHDL 3 12/20/2021   Lab Results  Component Value Date   HGBA1C 5.3 08/30/2020      Assessment & Plan:  Labs: Routine blood work ordered today. Problem List Items Addressed This Visit     Atrial fibrillation ([place]) - Primary    Rte controled tolerating meds      Relevant Orders   CBC with Differential/Platelet   Comprehensive metabolic panel   Lipid panel   TSH   Gout    Hydrate and monitor       Hyperglycemia   Relevant Orders   Hemoglobin A1c   Lipid panel   Hypomagnesemia    Supplement  and monitor       Relevant Orders   Magnesium   Magnesium   INSOMNIA    Encouraged good sleep hygiene such as dark, quiet room. No blue/green glowing lights such as computer screens in bedroom. No alcohol or stimulants in evening. Cut down on caffeine as able. Regular exercise is helpful but not just prior to bed time.  Amitriptyline prn      Mild persistent asthma, uncomplicated    No recent exacerbation      Osteoporosis   Relevant Orders   VITAMIN D 25 Hydroxy (Vit-D Deficiency, Fractures)    Urinary frequency    Check UA and culture      Relevant Orders   Urinalysis, Routine w reflex microscopic   Urine Culture   Urine Culture   Vitamin B 12 deficiency    Supplement and monitor       Relevant Orders   Vitamin B12   No orders of the defined types were placed in this encounter.  I, Penni Homans, MD, personally preformed the services described in this documentation.  All medical record entries made by the scribe were at my direction and in my presence.  I have reviewed the chart and discharge instructions (if applicable) and agree that the record reflects my personal performance and is accurate and complete. 06/20/2022  I,Mohammed Iqbal,acting as a scribe for Penni Homans, MD.,have documented all relevant documentation on the behalf of Penni Homans, MD,as directed by  Penni Homans, MD while in the presence of Penni Homans, MD.  Penni Homans, MD

## 2022-06-20 NOTE — Assessment & Plan Note (Signed)
Check UA and culture 

## 2022-06-20 NOTE — Telephone Encounter (Signed)
Pt c/o medication issue:  1. Name of Medication: apixaban (ELIQUIS) 5 MG TABS tablet   2. How are you currently taking this medication (dosage and times per day)? 1 tablet twice daily  3. Are you having a reaction (difficulty breathing--STAT)? no  4. What is your medication issue? Patient calling in regards to getting a discount on her eliquis. She says she has not gotten the paperwork to fill out for it and is not sure if the office does this automatically. She says to call any time after 11:30 am. She is also calling in regards to her husband. I will send a phone note for him as well.

## 2022-06-20 NOTE — Assessment & Plan Note (Signed)
No recent exacerbation 

## 2022-06-20 NOTE — Patient Instructions (Signed)
Diarrhea, Adult Diarrhea is frequent loose and sometimes watery bowel movements. Diarrhea can make you feel weak and cause you to become dehydrated. Dehydration is a condition in which there is not enough water or other fluids in the body. Dehydration can make you tired and thirsty, cause you to have a dry mouth, and decrease how often you urinate. Diarrhea typically lasts 2-3 days. However, it can last longer if it is a sign of something more serious. It is important to treat your diarrhea as told by your health care provider. Follow these instructions at home: Eating and drinking     Follow these recommendations as told by your health care provider: Take an oral rehydration solution (ORS). This is an over-the-counter medicine that helps return your body to its normal balance of nutrients and water. It is found at pharmacies and retail stores. Drink enough fluid to keep your urine pale yellow. Drink fluids such as water, diluted fruit juice, and low-calorie sports drinks. You can drink milk also, if desired. Sucking on ice chips is another way to get fluids. Avoid drinking fluids that contain a lot of sugar or caffeine, such as soda, energy drinks, and regular sports drinks. Avoid alcohol. Eat bland, easy-to-digest foods in small amounts as you are able. These foods include bananas, applesauce, rice, lean meats, toast, and crackers. Avoid spicy or fatty foods.  Medicines Take over-the-counter and prescription medicines only as told by your health care provider. If you were prescribed antibiotics, take them as told by your health care provider. Do not stop using the antibiotic even if you start to feel better. General instructions  Wash your hands often using soap and water for at least 20 seconds. If soap and water are not available, use hand sanitizer. Others in the household should wash their hands as well. Hands should be washed: After using the toilet or changing a diaper. Before  preparing, cooking, or serving food. While caring for a sick person or while visiting someone in a hospital. Rest at home while you recover. Take a warm bath to relieve any burning or pain from frequent diarrhea episodes. Watch your condition for any changes. Contact a health care provider if: You have a fever. Your diarrhea gets worse. You have new symptoms. You vomit every time you eat or drink. You feel light-headed, dizzy, or have a headache. You have muscle cramps. You have signs of dehydration, such as: Dark urine, very little urine, or no urine. Cracked lips. Dry mouth. Sunken eyes. Sleepiness. Weakness. You have bloody or black stools or stools that look like tar. You have severe pain, cramping, or bloating in your abdomen. Your skin feels cold and clammy. You feel confused. Get help right away if: You have chest pain or your heart is beating very quickly. You have trouble breathing or you are breathing very quickly. You feel extremely weak or you faint. These symptoms may be an emergency. Get help right away. Call 911. Do not wait to see if the symptoms will go away. Do not drive yourself to the hospital. This information is not intended to replace advice given to you by your health care provider. Make sure you discuss any questions you have with your health care provider. Document Revised: 08/30/2021 Document Reviewed: 08/30/2021 Elsevier Patient Education  2023 Elsevier Inc.  

## 2022-06-21 LAB — LIPID PANEL
Cholesterol: 163 mg/dL (ref 0–200)
HDL: 54.8 mg/dL (ref >39.00)
LDL Cholesterol: 77 mg/dL (ref 0–99)
NonHDL: 107.76
Total CHOL/HDL Ratio: 3
Triglycerides: 154 mg/dL — ABNORMAL HIGH (ref 0.0–149.0)
VLDL: 30.8 mg/dL (ref 0.0–40.0)

## 2022-06-21 LAB — COMPREHENSIVE METABOLIC PANEL
ALT: 13 U/L (ref 0–35)
AST: 17 U/L (ref 0–37)
Albumin: 4 g/dL (ref 3.5–5.2)
Alkaline Phosphatase: 68 U/L (ref 39–117)
BUN: 14 mg/dL (ref 6–23)
CO2: 30 mEq/L (ref 19–32)
Calcium: 8.9 mg/dL (ref 8.4–10.5)
Chloride: 98 mEq/L (ref 96–112)
Creatinine, Ser: 0.55 mg/dL (ref 0.40–1.20)
GFR: 82.58 mL/min (ref >60.00)
Glucose, Bld: 85 mg/dL (ref 70–99)
Potassium: 4.3 mEq/L (ref 3.5–5.1)
Sodium: 134 mEq/L — ABNORMAL LOW (ref 135–145)
Total Bilirubin: 0.3 mg/dL (ref 0.2–1.2)
Total Protein: 6.9 g/dL (ref 6.0–8.3)

## 2022-06-21 LAB — CBC WITH DIFFERENTIAL/PLATELET
Basophils Absolute: 0.1 10*3/uL (ref 0.0–0.1)
Basophils Relative: 0.9 % (ref 0.0–3.0)
Eosinophils Absolute: 0.1 10*3/uL (ref 0.0–0.7)
Eosinophils Relative: 2.3 % (ref 0.0–5.0)
HCT: 35.7 % — ABNORMAL LOW (ref 36.0–46.0)
Hemoglobin: 11.6 g/dL — ABNORMAL LOW (ref 12.0–15.0)
Lymphocytes Relative: 35 % (ref 12.0–46.0)
Lymphs Abs: 2.2 10*3/uL (ref 0.7–4.0)
MCHC: 32.6 g/dL (ref 30.0–36.0)
MCV: 93.5 fl (ref 78.0–100.0)
Monocytes Absolute: 0.6 10*3/uL (ref 0.1–1.0)
Monocytes Relative: 9.6 % (ref 3.0–12.0)
Neutro Abs: 3.3 10*3/uL (ref 1.4–7.7)
Neutrophils Relative %: 52.2 % (ref 43.0–77.0)
Platelets: 328 10*3/uL (ref 150.0–400.0)
RBC: 3.82 Mil/uL — ABNORMAL LOW (ref 3.87–5.11)
RDW: 14.5 % (ref 11.5–15.5)
WBC: 6.3 10*3/uL (ref 4.0–10.5)

## 2022-06-21 LAB — VITAMIN B12: Vitamin B-12: 225 pg/mL (ref 211–911)

## 2022-06-21 LAB — URINE CULTURE
MICRO NUMBER:: 14743056
Result:: NO GROWTH
SPECIMEN QUALITY:: ADEQUATE

## 2022-06-21 LAB — URINALYSIS, ROUTINE W REFLEX MICROSCOPIC
Bilirubin Urine: NEGATIVE
Hgb urine dipstick: NEGATIVE
Ketones, ur: NEGATIVE
Leukocytes,Ua: NEGATIVE
Nitrite: NEGATIVE
RBC / HPF: NONE SEEN (ref 0–?)
Specific Gravity, Urine: <=1.005 — AB (ref 1.000–1.030)
Total Protein, Urine: NEGATIVE
Urine Glucose: NEGATIVE
Urobilinogen, UA: 0.2 (ref 0.0–1.0)
pH: 6.5 (ref 5.0–8.0)

## 2022-06-21 LAB — TSH: TSH: 1.54 u[IU]/mL (ref 0.35–5.50)

## 2022-06-21 LAB — VITAMIN D 25 HYDROXY (VIT D DEFICIENCY, FRACTURES): VITD: 24.34 ng/mL — ABNORMAL LOW (ref 30.00–100.00)

## 2022-06-21 LAB — HEMOGLOBIN A1C: Hgb A1c MFr Bld: 5.2 % (ref 4.6–6.5)

## 2022-06-21 LAB — MAGNESIUM: Magnesium: 1.9 mg/dL (ref 1.5–2.5)

## 2022-06-21 NOTE — Telephone Encounter (Signed)
**Note De-Identified Alexis Olson Obfuscation** The pt states that she just wanted me to be aware that she plans to bring her and her husband, [name] applications for Eliquis assistance to drop off in the front office either tomorrow or Friday of this this week.  She had several questions to ask concerning the applications and I answered all to her satisfaction. She is aware to call Jeani Hawking at Dr Alan Ripper office if she has any questions or concerns about their BMSPA[employer] applications going forward.  She thanked me for calling her back.

## 2022-06-22 ENCOUNTER — Other Ambulatory Visit: Payer: Self-pay

## 2022-06-22 ENCOUNTER — Other Ambulatory Visit (HOSPITAL_COMMUNITY): Payer: Self-pay

## 2022-06-22 DIAGNOSIS — M19011 Primary osteoarthritis, right shoulder: Secondary | ICD-10-CM | POA: Diagnosis not present

## 2022-06-22 MED ORDER — VITAMIN D (ERGOCALCIFEROL) 1.25 MG (50000 UNIT) PO CAPS
50000.0000 [IU] | ORAL_CAPSULE | ORAL | 0 refills | Status: DC
  Filled 2022-06-22: qty 12, 84d supply, fill #0

## 2022-06-23 ENCOUNTER — Other Ambulatory Visit: Payer: Self-pay

## 2022-06-23 ENCOUNTER — Other Ambulatory Visit (HOSPITAL_COMMUNITY): Payer: Self-pay

## 2022-06-26 ENCOUNTER — Other Ambulatory Visit: Payer: Self-pay | Admitting: Internal Medicine

## 2022-06-27 ENCOUNTER — Other Ambulatory Visit: Payer: Self-pay

## 2022-06-27 ENCOUNTER — Other Ambulatory Visit (HOSPITAL_COMMUNITY): Payer: Self-pay

## 2022-06-27 MED ORDER — OMEPRAZOLE 20 MG PO CPDR
20.0000 mg | DELAYED_RELEASE_CAPSULE | Freq: Every day | ORAL | 0 refills | Status: DC
  Filled 2022-06-27: qty 90, 90d supply, fill #0

## 2022-06-28 ENCOUNTER — Telehealth: Payer: Self-pay

## 2022-06-28 NOTE — Telephone Encounter (Signed)
**Note De-Identified Lavante Toso Obfuscation** The pts wife is advised that we plan to fax her and the pts BMSPAF/Eliquis applications to [employer] today.

## 2022-06-28 NOTE — Telephone Encounter (Signed)
**Note De-Identified Alexis Olson Obfuscation** The pts completed [employer]MS[employer] application for Eliquis assistance was left at the office with documents.  I have completed the providers page of her application and have e-mailed all to the nurse working with our DOD, Dr Curt Bears so she can obtain his signature, date it, and to then fax all to [facility] at the fax number written on the cover letter included.

## 2022-06-28 NOTE — Telephone Encounter (Signed)
Eliquis 5 mg - samples placed at front desk for pick up today.

## 2022-06-28 NOTE — Telephone Encounter (Signed)
Patient returned call

## 2022-06-29 NOTE — Telephone Encounter (Signed)
Patient assistance forms signed and faxed. 

## 2022-06-30 ENCOUNTER — Other Ambulatory Visit (HOSPITAL_COMMUNITY): Payer: Self-pay

## 2022-07-05 ENCOUNTER — Other Ambulatory Visit: Payer: Self-pay | Admitting: Internal Medicine

## 2022-07-06 ENCOUNTER — Other Ambulatory Visit (HOSPITAL_COMMUNITY): Payer: Self-pay

## 2022-07-06 ENCOUNTER — Other Ambulatory Visit: Payer: Self-pay

## 2022-07-06 MED ORDER — HEMOCYTE PLUS 106-1 MG PO CAPS
1.0000 | ORAL_CAPSULE | Freq: Two times a day (BID) | ORAL | 1 refills | Status: DC
  Filled 2022-07-06 – 2022-07-11 (×2): qty 30, 15d supply, fill #0
  Filled 2022-09-16: qty 60, 30d supply, fill #1

## 2022-07-07 ENCOUNTER — Other Ambulatory Visit (HOSPITAL_COMMUNITY): Payer: Self-pay

## 2022-07-10 ENCOUNTER — Encounter: Payer: Self-pay | Admitting: *Deleted

## 2022-07-10 ENCOUNTER — Other Ambulatory Visit: Payer: Self-pay

## 2022-07-10 ENCOUNTER — Other Ambulatory Visit (HOSPITAL_COMMUNITY): Payer: Self-pay

## 2022-07-11 ENCOUNTER — Other Ambulatory Visit: Payer: Self-pay

## 2022-07-11 ENCOUNTER — Other Ambulatory Visit (HOSPITAL_COMMUNITY): Payer: Self-pay

## 2022-07-11 NOTE — Telephone Encounter (Signed)
**Note De-Identified Aquinnah Devin Obfuscation** Letter received from [facility] S[facility]us [place]elford fax stating that they have denied the pt Eliquis assistance at this time. Reason for denial: Documentation of 3% out of pocket RX expenses, based on the households adjusted gross income, not met. [id number]   The letter states that they have notified the pt of this denial as well.

## 2022-07-12 ENCOUNTER — Other Ambulatory Visit (HOSPITAL_COMMUNITY): Payer: Self-pay

## 2022-07-12 ENCOUNTER — Other Ambulatory Visit: Payer: Self-pay

## 2022-07-13 ENCOUNTER — Other Ambulatory Visit: Payer: Self-pay

## 2022-07-13 ENCOUNTER — Other Ambulatory Visit (HOSPITAL_COMMUNITY): Payer: Self-pay

## 2022-07-14 ENCOUNTER — Other Ambulatory Visit (HOSPITAL_COMMUNITY): Payer: Self-pay

## 2022-07-17 ENCOUNTER — Other Ambulatory Visit (HOSPITAL_COMMUNITY): Payer: Self-pay

## 2022-07-18 ENCOUNTER — Telehealth: Payer: Self-pay | Admitting: Family Medicine

## 2022-07-18 ENCOUNTER — Other Ambulatory Visit: Payer: Self-pay

## 2022-07-18 ENCOUNTER — Other Ambulatory Visit (HOSPITAL_COMMUNITY): Payer: Self-pay

## 2022-07-18 NOTE — Telephone Encounter (Signed)
Contacted Corra Kaine Hellard to schedule their annual wellness visit. Appointment made for 08/04/2022.  Verlee Rossetti; Care Guide Ambulatory Clinical Support [place] [facility] Direct Dial: [phone]

## 2022-07-19 ENCOUNTER — Other Ambulatory Visit (HOSPITAL_COMMUNITY): Payer: Self-pay

## 2022-07-20 ENCOUNTER — Other Ambulatory Visit (HOSPITAL_COMMUNITY): Payer: Self-pay

## 2022-07-20 ENCOUNTER — Other Ambulatory Visit: Payer: Self-pay

## 2022-07-24 ENCOUNTER — Other Ambulatory Visit (HOSPITAL_COMMUNITY): Payer: Self-pay

## 2022-07-24 ENCOUNTER — Other Ambulatory Visit: Payer: Self-pay | Admitting: Family Medicine

## 2022-07-25 ENCOUNTER — Other Ambulatory Visit (HOSPITAL_COMMUNITY): Payer: Self-pay

## 2022-07-25 MED ORDER — ALPRAZOLAM 0.5 MG PO TABS
0.5000 mg | ORAL_TABLET | Freq: Every evening | ORAL | 3 refills | Status: DC | PRN
  Filled 2022-07-25: qty 30, 30d supply, fill #0
  Filled 2022-08-23 (×2): qty 30, 30d supply, fill #1
  Filled 2022-09-21: qty 30, 30d supply, fill #2
  Filled 2022-10-20 (×2): qty 30, 30d supply, fill #3

## 2022-07-25 NOTE — Telephone Encounter (Signed)
Requesting: alprazolam 0.5mg   Contract: 08/30/20 UDS: 11/11/20 Last Visit: 06/20/22 Next Visit: None Last Refill: 03/22/22 #30 and 0RF   Please Advise

## 2022-07-29 ENCOUNTER — Other Ambulatory Visit (HOSPITAL_COMMUNITY): Payer: Self-pay

## 2022-08-04 NOTE — Progress Notes (Deleted)
Subjective:   Alexis Olson is a 89 y.o. female who presents for Medicare Annual (Subsequent) preventive examination.  Review of Systems           Objective:    There were no vitals filed for this visit. There is no height or weight on file to calculate BMI.     10/23/2021    9:14 AM 07/28/2021    2:51 PM 01/25/2021    6:32 PM 01/17/2021    2:13 PM 07/11/2020   11:29 PM 07/06/2020    1:00 AM 07/05/2020    5:57 PM  Advanced Directives  Does Patient Have a Medical Advance Directive? No Yes Yes Yes No Yes Yes  Type of Special educational needs teacher of [place];Living will Healthcare Power of [place];Living will Living will;Healthcare Power of [employer] Power of Attorney   Does patient want to make changes to medical advance directive?   No - Patient declined   No - Patient declined   Copy of Healthcare Power of Attorney in Chart?  Yes - validated most recent copy scanned in chart (See row information) No - copy requested No - copy requested       Current Medications (verified) Outpatient Encounter Medications as of 08/04/2022  Medication Sig   acetaminophen (TYLENOL) 500 MG tablet Take 2 tablets (1,000 mg total) by mouth every 8 (eight) hours. (Patient taking differently: Take 1,000 mg by mouth every 8 (eight) hours as needed for mild pain.)   albuterol (PROVENTIL HFA;VENTOLIN HFA) 108 (90 BASE) MCG/ACT inhaler Inhale 1-2 puffs into the lungs every 6 (six) hours as needed for wheezing or shortness of breath.   ALPRAZolam (XANAX) 0.5 MG tablet Take 1 tablet by mouth at bedtime as needed for anxiety.   amitriptyline (ELAVIL) 100 MG tablet Take 1 tablet (100 mg total) by mouth daily.   apixaban (ELIQUIS) 5 MG TABS tablet Take 1 tablet (5 mg total) by mouth every 12 (twelve) hours.   azelastine (OPTIVAR) 0.05 % ophthalmic solution Place 1 drop into both eyes daily as needed (for dry eyes).   budesonide-formoterol (SYMBICORT) 160-4.5 MCG/ACT inhaler Inhale 2 puffs into the  lungs See admin instructions. Inhale 2 puffs into the lungs one to two times a day   cetirizine (ZYRTEC) 10 MG tablet Take 10 mg by mouth daily.    cyanocobalamin (DODEX) 1000 MCG/ML injection Inject 1 mL (1,000 mcg total) into the muscle every 30 (thirty) days.   dicyclomine (BENTYL) 20 MG tablet Take 0.5-1 tablets (10-20 mg total) by mouth 3 (three) times daily as needed.   diphenoxylate-atropine (LOMOTIL) 2.5-0.025 MG tablet Take 1 to 2 tablets by mouth up to 4 times a day   docusate sodium (COLACE) 100 MG capsule Take 1 capsule (100 mg total) by mouth 2 (two) times daily.   Fe Fum-FA-B Cmp-C-Zn-Mg-Mn-Cu (HEMOCYTE PLUS) 106-1 MG CAPS Take 1 capsule by mouth 2 (two) times daily.   fluticasone (FLONASE) 50 MCG/ACT nasal spray Place 1 to 2 sprays in each nostril once a day   gabapentin (NEURONTIN) 300 MG capsule Take 1 capsule (300 mg total) by mouth at bedtime.   hydrOXYzine (ATARAX) 25 MG tablet Take 1 tablet (25 mg total) by mouth 3 (three) times daily.   levocetirizine (XYZAL) 5 MG tablet Take 1 tablet (5 mg total) by mouth every evening.   magic mouthwash (nystatin, hydrocortisone, diphenhydrAMINE) suspension Swish, gargle and spit 5 to 10 mLs 4 times daily as needed.   magnesium oxide (MAG-OX) 400  MG tablet Take 400 mg by mouth 2 (two) times daily.   methocarbamol (ROBAXIN) 500 MG tablet Take 1 tablet (500 mg total) by mouth 3 (three) times daily as needed.   metoprolol succinate (TOPROL XL) 25 MG 24 hr tablet Take 1 tablet (25 mg total) by mouth daily.   montelukast (SINGULAIR) 10 MG tablet Take 1 tablet (10 mg total) by mouth every evening.   omeprazole (PRILOSEC) 20 MG capsule Take 1 capsule (20 mg total) by mouth daily.   ondansetron (ZOFRAN-ODT) 4 MG disintegrating tablet Take 1 tablet (4 mg total) by mouth 3 (three) times daily as needed for nausea or vomiting.   phenazopyridine (PYRIDIUM) 100 MG tablet Take 1 tablet (100 mg total) by mouth 3 (three) times daily as needed for pain.    potassium chloride SA (KLOR-CON M) 20 MEQ tablet Take 1 tablet (20 mEq total) by mouth daily.   Probiotic Product (PROBIOTIC DAILY PO) Take 1 capsule by mouth daily.   Spacer/Aero-Holding Chambers (AEROCHAMBER PLUS WITH MASK) inhaler Use as directed   terconazole (TERAZOL 7) 0.4 % vaginal cream Insert 1 applicatorful vaginally at bedtime for 7 nights.  Apply externally as well nightly.   triamcinolone (KENALOG) 0.025 % ointment Apply 1 application topically 2 (two) times daily.   Vitamin D, Ergocalciferol, (DRISDOL) 1.25 MG (50000 UNIT) CAPS capsule Take 1 capsule (50,000 Units total) by mouth every 7 (seven) days for 12 weeks.   zolpidem (AMBIEN) 5 MG tablet Take 1 tablet (5 mg total) by mouth at bedtime.   No facility-administered encounter medications on file as of 08/04/2022.    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 difficile diarrhea    Colon perforation (HCC) 08/2014   following polypetomy during colo, surgical repair   COLONIC POLYPS, ADENOMATOUS, HX OF 2010   DIVERTICULOSIS, COLON    DVT (deep venous thrombosis) (HCC) 2018   DVT (deep venous thrombosis) (HCC)    GERD (gastroesophageal reflux disease) 08/23/2015   Graded compression stocking in place    History of blood transfusion    History of migraine    History of rheumatic fever    HSV (herpes simplex virus) infection 08/16/2015   Hyperlipidemia    Hypertension    Denies   Hyponatremia 05/02/2016   Incomplete left bundle branch  block (LBBB) 2016   Noted on EKG   INSOMNIA    Lumbosacral disc disease    Chronic pain; History of osteomyelitis 2010 following ESI complication   Obesity    Osteomyelitis (HCC)    Pelvic fracture (HCC)    Pneumonia    Recurrent UTI 08/23/2015   URINARY URGENCY    Ventral hernia without obstruction or gangrene 05/22/2018   VITAMIN B12 DEFICIENCY    Past Surgical History:  Procedure Laterality Date   BACK SURGERY  1994   for ruptured disc   CHOLECYSTECTOMY     COLONOSCOPY     greenfield filter     ileojejunal bypass  1976   for obesity   LAPAROTOMY N/A  09/18/2014   Procedure: EXPLORATORY LAPAROTOMY WITH REPAIR OF CECIAL PERFORATION;  Surgeon: Chevis Pretty III, MD;  Location: [facility];  Service: General;  Laterality: N/A;   TONSILLECTOMY     TOTAL KNEE ARTHROPLASTY Right 08/26/2019   Procedure: RIGHT TOTAL KNEE ARTHROPLASTY, LEFT KNEE CORTISONE INJECTION;  Surgeon: Durene Romans, MD;  Location: [facility];  Service: Orthopedics;  Laterality: Right;  70 MINS   TOTAL KNEE ARTHROPLASTY Left 01/25/2021   Procedure: TOTAL KNEE ARTHROPLASTY;  Surgeon: Durene Romans, MD;  Location: [facility];  Service: Orthopedics;  Laterality: Left;   UPPER GI ENDOSCOPY     VASCULAR SURGERY Right    revascularization right lower extremity   Family History  Problem Relation Age of Onset   Cervical cancer Mother    Stroke Mother    Liver disease Sister    Kidney disease Sister    Cirrhosis Sister    Colon cancer Neg Hx    Esophageal cancer Neg Hx    Rectal cancer Neg Hx    Stomach cancer Neg Hx    Social History   Socioeconomic History   Marital status: Married    Spouse name: Not on file   Number of children: 3   Years of education: Not on file   Highest education level: Not on file  Occupational History   Occupation: retired Teacher, adult education: RETIRED  Tobacco Use   Smoking status: Never   Smokeless tobacco: Never  Vaping Use   Vaping Use: Never used  Substance and Sexual Activity   Alcohol use: No     Alcohol/week: 0.0 standard drinks of alcohol   Drug use: No   Sexual activity: Never    Partners: Male  Other Topics Concern   Not on file  Social History Narrative   Lives with spouse, Indep ADLs   Supportive family nearby   No dietary restrictions   Social Determinants of Health   Financial Resource Strain: Low Risk  (07/28/2021)   Overall Financial Resource Strain (CARDIA)    Difficulty of Paying Living Expenses: Not hard at all  Food Insecurity: No Food Insecurity (12/12/2021)   Hunger Vital Sign    Worried About Running Out of Food in the Last Year: Never true    Ran Out of Food in the Last Year: Never true  Transportation Needs: No Transportation Needs (12/12/2021)   PRAPARE - Administrator, Civil Service (Medical): No    Lack of Transportation (Non-Medical): No  Physical Activity: Insufficiently Active (07/28/2021)   Exercise Vital Sign    Days of Exercise per Week: 3 days    Minutes of Exercise per Session: 30 min  Stress: No Stress Concern Present (12/12/2021)   [employer] of Occupational Health - Occupational Stress Questionnaire    Feeling of Stress : Not at all  Social Connections: Socially Integrated (07/28/2021)   Social Connection and Isolation Panel [NHANES]    Frequency of Communication with Friends and Family: More than three times a week    Frequency of Social Gatherings with Friends and Family: More than three times a week    Attends Religious Services: More than 4 times per year    Active Member of [employer] or Organizations: Yes    Attends Engineer, structural: More than 4 times per year    Marital Status: Married    Tobacco Counseling Counseling given: Not Answered   Clinical Intake:  Activities of Daily Living     No data to display          Patient Care Team: Bradd Canary, MD as PCP - General (Family Medicine) Pricilla Riffle, MD as PCP - Cardiology (Cardiology) Barron Alvine, MD  (Inactive) (Urology) Huel Cote, MD (Obstetrics and Gynecology) Deberah Castle, MD (Physical Medicine and Rehabilitation) Sidney Ace, MD (Allergy) Sheran Luz, MD (Physical Medicine and Rehabilitation) Antony Contras, MD (Ophthalmology)  Indicate any recent Medical Services you may have received from other than [facility] providers in the past year (date may be approximate).     Assessment:   This is a routine wellness examination for [place].  Hearing/Vision screen No results found.  Dietary issues and exercise activities discussed:     Goals Addressed   None    Depression Screen    06/20/2022    1:33 PM 01/23/2022   12:06 PM 12/20/2021    2:04 PM 09/28/2021    4:07 PM 09/12/2021    1:54 PM 07/28/2021    2:47 PM 11/27/2019    4:06 PM  PHQ 2/9 Scores  PHQ - 2 Score 0 0 0 0 0 0 0  PHQ- 9 Score 0  0    4    Fall Risk    06/20/2022    1:33 PM 01/23/2022   12:06 PM 12/20/2021    2:04 PM 07/28/2021    2:52 PM 09/03/2020    1:58 PM  Fall Risk   Falls in the past year? 0 1 1 0 1  Number falls in past yr: 0 0 0 0 0  Injury with Fall? 0 1 1 0 1  Comment     fall "very very small bleed on right side" the chair she was holding onto fell over; reports put two hands on the same side of the chair and it tilted over causing her to hit her head- happended on Easter  Risk for fall due to :  History of fall(s);Impaired balance/gait  Impaired balance/gait   Follow up Falls evaluation completed Falls evaluation completed;Falls prevention discussed Falls evaluation completed Falls prevention discussed     FALL RISK PREVENTION PERTAINING TO THE HOME:  Any stairs in or around the home? {YES/NO:21197} If so, are there any without handrails? {YES/NO:21197} Home free of loose throw rugs in walkways, pet beds, electrical cords, etc? {YES/NO:21197} Adequate lighting in your home to reduce risk of falls? {YES/NO:21197}  ASSISTIVE DEVICES UTILIZED TO PREVENT FALLS:  Life alert?  {YES/NO:21197} Use of a cane, walker or w/c? {YES/NO:21197} Grab bars in the bathroom? {YES/NO:21197} Shower chair or bench in shower? {YES/NO:21197} Elevated toilet seat or a handicapped toilet? {YES/NO:21197}  TIMED UP AND GO:  Was the test performed? {YES/NO:21197}.  Length of time to ambulate 10 feet: *** sec.   {Appearance of ZOXW:9604540}  Cognitive Function:        07/28/2021    2:55 PM  6CIT Screen  What Year? 0 points  What month? 0 points  What time? 0 points  Count back from 20 0 points  Months in reverse 0 points  Repeat phrase 0 points  Total Score 0 points    Immunizations Immunization History  Administered Date(s) Administered   Fluad Quad(high Dose 65+) 12/30/2018, 12/20/2021   Influenza Whole 12/24/2008   Influenza, High Dose Seasonal PF 12/15/2013, 12/21/2015, 05/15/2016, 02/05/2017, 01/15/2018, 03/13/2018, 03/12/2019, 01/19/2020, 03/15/2020, 03/15/2021   Influenza,inj,Quad PF,6+ Mos 12/27/2012   Influenza,inj,quad, With Preservative 12/30/2018   Influenza-Unspecified 11/26/2011, 12/21/2014  PFIZER Comirnaty(Gray Top)Covid-19 Tri-Sucrose Vaccine 11/12/2020   PFIZER(Purple Top)SARS-COV-2 Vaccination 04/20/2019, 05/12/2019, 03/15/2020, 03/15/2021   Pneumococcal Conjugate-13 02/12/2014   Pneumococcal Polysaccharide-23 03/27/2005, 05/15/2016, 08/03/2017, 03/13/2018, 03/12/2019, 03/15/2020, 03/15/2021   Td 02/12/2014   Zoster, Live 02/06/2012    TDAP status: Up to date  Flu Vaccine status: Up to date  Pneumococcal vaccine status: Up to date  Covid-19 vaccine status: Information provided on how to obtain vaccines.   Qualifies for Shingles Vaccine? Yes   Zostavax completed Yes   Shingrix Completed?: No.    Education has been provided regarding the importance of this vaccine. Patient has been advised to call insurance company to determine out of pocket expense if they have not yet received this vaccine. Advised may also receive vaccine at local  pharmacy or Health Dept. Verbalized acceptance and understanding.  Screening Tests Health Maintenance  Topic Date Due   Zoster Vaccines- Shingrix (1 of 2) Never done   Medicare Annual Wellness (AWV)  07/29/2022   COVID-19 Vaccine (6 - 2023-24 season) 10/20/2022 (Originally 11/25/2021)   INFLUENZA VACCINE  10/26/2022   DTaP/Tdap/Td (2 - Tdap) 02/13/2024   Pneumonia Vaccine 68+ Years old  Completed   DEXA SCAN  Completed   HPV VACCINES  Aged Out    Health Maintenance  Health Maintenance Due  Topic Date Due   Zoster Vaccines- Shingrix (1 of 2) Never done   Medicare Annual Wellness (AWV)  07/29/2022    Colorectal cancer screening: No longer required.   {Mammogram status:21018020}  Bone Density status: Ordered 12/20/21. Pt provided with contact info and advised to call to schedule appt.  Lung Cancer Screening: (Low Dose CT Chest recommended if Age 83-80 years, 30 pack-year currently smoking OR have quit w/in 15years.) does not qualify.   Additional Screening:  Hepatitis C Screening: does not qualify  Vision Screening: Recommended annual ophthalmology exams for early detection of glaucoma and other disorders of the eye. Is the patient up to date with their annual eye exam?  {YES/NO:21197} Who is the provider or what is the name of the office in which the patient attends annual eye exams? *** If pt is not established with a provider, would they like to be referred to a provider to establish care? {YES/NO:21197}.   Dental Screening: Recommended annual dental exams for proper oral hygiene  Community Resource Referral / Chronic Care Management: CRR required this visit?  No   CCM required this visit?  No      Plan:     I have personally reviewed and noted the following in the patient's chart:   Medical and social history Use of alcohol, tobacco or illicit drugs  Current medications and supplements including opioid prescriptions. Patient is not currently taking opioid  prescriptions. Functional ability and status Nutritional status Physical activity Advanced directives List of other physicians Hospitalizations, surgeries, and ER visits in previous 12 months Vitals Screenings to include cognitive, depression, and falls Referrals and appointments  In addition, I have reviewed and discussed with patient certain preventive protocols, quality metrics, and best practice recommendations. A written personalized care plan for preventive services as well as general preventive health recommendations were provided to patient.     Donne Anon, [place]   08/04/2022   Nurse Notes: None

## 2022-08-09 ENCOUNTER — Other Ambulatory Visit (HOSPITAL_COMMUNITY): Payer: Self-pay

## 2022-08-10 ENCOUNTER — Other Ambulatory Visit (HOSPITAL_COMMUNITY): Payer: Self-pay

## 2022-08-14 ENCOUNTER — Other Ambulatory Visit: Payer: Self-pay

## 2022-08-14 ENCOUNTER — Other Ambulatory Visit: Payer: Self-pay | Admitting: Internal Medicine

## 2022-08-14 MED ORDER — DIPHENOXYLATE-ATROPINE 2.5-0.025 MG PO TABS
1.0000 | ORAL_TABLET | Freq: Four times a day (QID) | ORAL | 1 refills | Status: DC
  Filled 2022-08-14: qty 180, 23d supply, fill #0
  Filled 2022-11-18: qty 180, 23d supply, fill #1

## 2022-08-14 NOTE — Telephone Encounter (Signed)
Please advise Sir, thank you. 

## 2022-08-18 ENCOUNTER — Other Ambulatory Visit (HOSPITAL_COMMUNITY): Payer: Self-pay

## 2022-08-22 ENCOUNTER — Telehealth: Payer: Self-pay | Admitting: Internal Medicine

## 2022-08-22 ENCOUNTER — Other Ambulatory Visit: Payer: Self-pay

## 2022-08-22 ENCOUNTER — Other Ambulatory Visit (HOSPITAL_COMMUNITY): Payer: Self-pay

## 2022-08-22 NOTE — Telephone Encounter (Signed)
Paper Work Dropped Off: prescription reports for [employer]  Date: 05.28.24  Location of paper: Placed in Dr [employer]

## 2022-08-23 ENCOUNTER — Other Ambulatory Visit (HOSPITAL_COMMUNITY): Payer: Self-pay

## 2022-08-23 NOTE — Telephone Encounter (Signed)
Epimenio Foot, LPN, I found it and scan it to your email. Thanks

## 2022-08-23 NOTE — Telephone Encounter (Signed)
**Note De-Identified Alexis Olson Obfuscation** See phone note in the pts chart 04/03 for more details.

## 2022-08-23 NOTE — Telephone Encounter (Addendum)
**Note De-Identified Abrahim Sargent Obfuscation** The pt left her and her husband, Fayrene Fearing out of pocket RX expense reports at the office. I have faxed all to [facility] as requested.  I did receive confirmation that the fax sent successfully.  Fax: Tx 'ok' Report CONE_EMAIL-to-Fax Santana Edell, Elease Hashimoto This message was sent Ruberta Holck [facility], a product from [employer]. [email or web address]://[email or web address].com/  -------Fax Transmission Report-------  To:               Recipient at [phone] Subject:          Fw: James&Lilana [place] Result:           The transmission was successful. Explanation:      All Pages Ok Pages Sent:       8 Connect Time:     2 minutes, 19 seconds Transmit Time:    08/23/2022 09:42 Transfer Rate:    14400 Status Code:      0000 Retry Count:      1 Job Id:           [id number] Unique Id:        [id number] Fax Line:         32 Fax Server:       [id number]

## 2022-08-24 ENCOUNTER — Telehealth: Payer: Self-pay | Admitting: Internal Medicine

## 2022-08-24 NOTE — Telephone Encounter (Signed)
**Note De-Identified Alexis Olson Obfuscation** Letter received from [facility] stating that they have approved the pt for Eliquis assistance until 03/27/2023. [id number]  I have advised the pt of this approval Adreona Brand phone call.

## 2022-08-24 NOTE — Telephone Encounter (Signed)
**Note De-Identified [place] Obfuscation** I have advised Alexis Olson at [facility] that the pts home address is the preferred shipping address for both the pt and her husband's Eliquis. She verbalized understanding and stated she would update the pts applications with this information.

## 2022-08-24 NOTE — Telephone Encounter (Signed)
Pt c/o medication issue:  1. Name of Medication:   apixaban (ELIQUIS) 5 MG TABS tablet   2. How are you currently taking this medication (dosage and times per day)?   3. Are you having a reaction (difficulty breathing--STAT)?   4. What is your medication issue?     Caller stated they will need to know the patient and her husband's shipping preference to complete their application.  Caller wants to know if medication should be shipped to the patients directly or shipped to the office.

## 2022-09-02 ENCOUNTER — Other Ambulatory Visit: Payer: Self-pay | Admitting: Family Medicine

## 2022-09-04 ENCOUNTER — Other Ambulatory Visit (HOSPITAL_COMMUNITY): Payer: Self-pay

## 2022-09-04 ENCOUNTER — Other Ambulatory Visit: Payer: Self-pay

## 2022-09-04 MED ORDER — GABAPENTIN 300 MG PO CAPS
300.0000 mg | ORAL_CAPSULE | Freq: Every day | ORAL | 1 refills | Status: DC
  Filled 2022-09-04: qty 90, 90d supply, fill #0
  Filled 2022-12-03: qty 90, 90d supply, fill #1

## 2022-09-12 ENCOUNTER — Other Ambulatory Visit (HOSPITAL_COMMUNITY): Payer: Self-pay

## 2022-09-12 ENCOUNTER — Other Ambulatory Visit: Payer: Self-pay

## 2022-09-12 ENCOUNTER — Other Ambulatory Visit: Payer: Self-pay | Admitting: Family Medicine

## 2022-09-12 MED ORDER — ZOLPIDEM TARTRATE 5 MG PO TABS
5.0000 mg | ORAL_TABLET | Freq: Every day | ORAL | 1 refills | Status: DC
  Filled 2022-09-12 – 2022-09-15 (×3): qty 90, 90d supply, fill #0
  Filled 2022-12-05 – 2022-12-11 (×2): qty 90, 90d supply, fill #1

## 2022-09-12 NOTE — Telephone Encounter (Signed)
Requesting:  Ambien 5mg   Contract: 08/30/20 UDS: 11/11/20 Last Visit: 06/20/22 Next Visit: None Last Refill: 03/22/22 #90 and 0RF   Please Advise

## 2022-09-15 ENCOUNTER — Other Ambulatory Visit: Payer: Self-pay

## 2022-09-15 ENCOUNTER — Other Ambulatory Visit (HOSPITAL_COMMUNITY): Payer: Self-pay

## 2022-09-16 ENCOUNTER — Other Ambulatory Visit (HOSPITAL_COMMUNITY): Payer: Self-pay

## 2022-09-18 ENCOUNTER — Other Ambulatory Visit: Payer: Self-pay

## 2022-09-18 ENCOUNTER — Other Ambulatory Visit (HOSPITAL_COMMUNITY): Payer: Self-pay

## 2022-09-19 ENCOUNTER — Telehealth: Payer: Self-pay | Admitting: Family Medicine

## 2022-09-19 NOTE — Telephone Encounter (Signed)
Pt called stating that she believes she has a UTI and was wondering if something could be sent in for it or if could have a culture and urinalysis done to look into it.

## 2022-09-20 ENCOUNTER — Other Ambulatory Visit: Payer: Self-pay

## 2022-09-20 DIAGNOSIS — R350 Frequency of micturition: Secondary | ICD-10-CM

## 2022-09-20 NOTE — Telephone Encounter (Signed)
Called spoke with Pt regarding Lab appt, UA and Culture  Was ordered future.

## 2022-09-20 NOTE — Telephone Encounter (Signed)
Patient called back to follow up on previous call since she had not heard. Advised that DR. Abner Greenspan needed to know her symptoms so an urine culture can be ordered. Pt said that she has frequent urination, pain when urinating and if she feels the urge to urinate but doesn't go right away, urine comes out. Pt said she has appt this afternoon but can come in tomorrow for UA. Please call to arrange once order placed.

## 2022-09-21 ENCOUNTER — Other Ambulatory Visit: Payer: Medicare PPO

## 2022-09-21 ENCOUNTER — Other Ambulatory Visit (HOSPITAL_COMMUNITY): Payer: Self-pay

## 2022-09-21 DIAGNOSIS — R350 Frequency of micturition: Secondary | ICD-10-CM

## 2022-09-21 NOTE — Progress Notes (Signed)
Pt came in to provide urine sample for lab visit.  Was provided 3 bottles of water and still unable to urinate in office.  Pt asked to take urine collection kit with her and plans to drop sample off at [facility] lab this afternoon.  Orders re-entered as future for [facility] lab.

## 2022-09-22 ENCOUNTER — Other Ambulatory Visit (INDEPENDENT_AMBULATORY_CARE_PROVIDER_SITE_OTHER): Payer: Medicare PPO

## 2022-09-22 ENCOUNTER — Telehealth: Payer: Self-pay | Admitting: Family Medicine

## 2022-09-22 ENCOUNTER — Other Ambulatory Visit: Payer: Self-pay | Admitting: Family Medicine

## 2022-09-22 ENCOUNTER — Other Ambulatory Visit (HOSPITAL_COMMUNITY): Payer: Self-pay

## 2022-09-22 DIAGNOSIS — R350 Frequency of micturition: Secondary | ICD-10-CM | POA: Diagnosis not present

## 2022-09-22 LAB — URINALYSIS, ROUTINE W REFLEX MICROSCOPIC
Bilirubin Urine: NEGATIVE
Ketones, ur: NEGATIVE
Nitrite: NEGATIVE
Specific Gravity, Urine: <=1.005 — AB (ref 1.000–1.030)
Total Protein, Urine: NEGATIVE
Urine Glucose: NEGATIVE
Urobilinogen, UA: 0.2 (ref 0.0–1.0)
pH: 7 (ref 5.0–8.0)

## 2022-09-22 MED ORDER — CEFDINIR 300 MG PO CAPS
300.0000 mg | ORAL_CAPSULE | Freq: Two times a day (BID) | ORAL | 0 refills | Status: AC
  Filled 2022-09-22 – 2022-09-23 (×2): qty 10, 5d supply, fill #0

## 2022-09-22 NOTE — Telephone Encounter (Signed)
Pt is hoping pcp or doc of the day will call in some medicine to help with her uti sxs. Advised her labs have not come back but she does not feel like she can wait. She also states she takes a bladder relaxer and would like more of that called in.    [place] [facility] [address], [place] [address] Phone: [phone]  Fax: [phone]

## 2022-09-23 ENCOUNTER — Other Ambulatory Visit (HOSPITAL_COMMUNITY): Payer: Self-pay

## 2022-09-23 LAB — URINE CULTURE
MICRO NUMBER:: 15140659
SPECIMEN QUALITY:: ADEQUATE

## 2022-09-25 ENCOUNTER — Other Ambulatory Visit: Payer: Self-pay | Admitting: Internal Medicine

## 2022-09-25 ENCOUNTER — Other Ambulatory Visit: Payer: Self-pay | Admitting: Family Medicine

## 2022-09-25 NOTE — Telephone Encounter (Signed)
Left detailed message on machine that rx was sent in on 6/28

## 2022-09-25 NOTE — Progress Notes (Signed)
Spoke with patient to follow up on recent labs patient aware of results and medication sent to pharmacy. Patient verbalized understanding and states she has picked up prescription. Patient advised to contact office if symptoms does not get better with abx.

## 2022-09-26 ENCOUNTER — Other Ambulatory Visit (HOSPITAL_COMMUNITY): Payer: Self-pay

## 2022-09-26 ENCOUNTER — Other Ambulatory Visit: Payer: Self-pay

## 2022-09-26 LAB — URINE CULTURE

## 2022-09-26 MED ORDER — AMITRIPTYLINE HCL 100 MG PO TABS
100.0000 mg | ORAL_TABLET | Freq: Every day | ORAL | 0 refills | Status: DC
  Filled 2022-09-26: qty 30, 30d supply, fill #0

## 2022-09-26 MED ORDER — OMEPRAZOLE 20 MG PO CPDR
20.0000 mg | DELAYED_RELEASE_CAPSULE | Freq: Every day | ORAL | 3 refills | Status: DC
  Filled 2022-09-26: qty 90, 90d supply, fill #0
  Filled 2022-12-21: qty 90, 90d supply, fill #1
  Filled 2023-03-26: qty 90, 90d supply, fill #2
  Filled 2023-06-21: qty 90, 90d supply, fill #3

## 2022-10-01 ENCOUNTER — Other Ambulatory Visit: Payer: Self-pay | Admitting: Family Medicine

## 2022-10-02 ENCOUNTER — Other Ambulatory Visit (HOSPITAL_COMMUNITY): Payer: Self-pay

## 2022-10-02 ENCOUNTER — Other Ambulatory Visit (HOSPITAL_BASED_OUTPATIENT_CLINIC_OR_DEPARTMENT_OTHER): Payer: Self-pay | Admitting: Internal Medicine

## 2022-10-02 DIAGNOSIS — I480 Paroxysmal atrial fibrillation: Secondary | ICD-10-CM

## 2022-10-02 MED ORDER — METOPROLOL SUCCINATE ER 25 MG PO TB24
25.0000 mg | ORAL_TABLET | Freq: Every day | ORAL | 0 refills | Status: DC
Start: 2022-10-02 — End: 2022-11-11
  Filled 2022-10-02: qty 30, 30d supply, fill #0

## 2022-10-03 ENCOUNTER — Other Ambulatory Visit: Payer: Self-pay

## 2022-10-03 ENCOUNTER — Other Ambulatory Visit (HOSPITAL_COMMUNITY): Payer: Self-pay

## 2022-10-07 ENCOUNTER — Other Ambulatory Visit (HOSPITAL_COMMUNITY): Payer: Self-pay

## 2022-10-20 ENCOUNTER — Other Ambulatory Visit (HOSPITAL_COMMUNITY): Payer: Self-pay

## 2022-10-23 ENCOUNTER — Other Ambulatory Visit (HOSPITAL_COMMUNITY): Payer: Self-pay

## 2022-10-25 ENCOUNTER — Other Ambulatory Visit (HOSPITAL_COMMUNITY): Payer: Self-pay

## 2022-10-25 ENCOUNTER — Encounter (HOSPITAL_COMMUNITY): Payer: Self-pay

## 2022-10-25 ENCOUNTER — Other Ambulatory Visit: Payer: Self-pay | Admitting: Family Medicine

## 2022-10-28 ENCOUNTER — Other Ambulatory Visit: Payer: Self-pay | Admitting: Family Medicine

## 2022-10-28 ENCOUNTER — Other Ambulatory Visit: Payer: Self-pay | Admitting: Family

## 2022-10-28 DIAGNOSIS — N9089 Other specified noninflammatory disorders of vulva and perineum: Secondary | ICD-10-CM

## 2022-10-30 ENCOUNTER — Other Ambulatory Visit: Payer: Self-pay

## 2022-10-30 MED ORDER — TERCONAZOLE 0.4 % VA CREA
1.0000 | TOPICAL_CREAM | Freq: Every day | VAGINAL | 0 refills | Status: DC
  Filled 2022-10-30: qty 45, 7d supply, fill #0

## 2022-10-30 MED ORDER — AMITRIPTYLINE HCL 100 MG PO TABS
100.0000 mg | ORAL_TABLET | Freq: Every day | ORAL | 0 refills | Status: DC
  Filled 2022-10-30: qty 30, 30d supply, fill #0

## 2022-10-31 ENCOUNTER — Other Ambulatory Visit: Payer: Self-pay | Admitting: Family Medicine

## 2022-10-31 ENCOUNTER — Other Ambulatory Visit: Payer: Self-pay

## 2022-10-31 NOTE — Telephone Encounter (Signed)
Patient notified

## 2022-11-01 ENCOUNTER — Other Ambulatory Visit (HOSPITAL_COMMUNITY): Payer: Self-pay

## 2022-11-01 ENCOUNTER — Other Ambulatory Visit: Payer: Self-pay

## 2022-11-01 MED ORDER — POTASSIUM CHLORIDE CRYS ER 20 MEQ PO TBCR
20.0000 meq | EXTENDED_RELEASE_TABLET | Freq: Every day | ORAL | 1 refills | Status: DC
  Filled 2022-11-01: qty 90, 90d supply, fill #0
  Filled 2023-02-08: qty 90, 90d supply, fill #1

## 2022-11-09 ENCOUNTER — Other Ambulatory Visit (HOSPITAL_COMMUNITY): Payer: Self-pay

## 2022-11-11 ENCOUNTER — Other Ambulatory Visit (HOSPITAL_BASED_OUTPATIENT_CLINIC_OR_DEPARTMENT_OTHER): Payer: Self-pay | Admitting: Internal Medicine

## 2022-11-11 DIAGNOSIS — I480 Paroxysmal atrial fibrillation: Secondary | ICD-10-CM

## 2022-11-13 ENCOUNTER — Other Ambulatory Visit (HOSPITAL_COMMUNITY): Payer: Self-pay

## 2022-11-13 ENCOUNTER — Other Ambulatory Visit: Payer: Self-pay

## 2022-11-13 MED ORDER — METOPROLOL SUCCINATE ER 25 MG PO TB24
25.0000 mg | ORAL_TABLET | Freq: Every day | ORAL | 0 refills | Status: DC
Start: 2022-11-13 — End: 2022-12-13
  Filled 2022-11-13: qty 30, 30d supply, fill #0

## 2022-11-14 ENCOUNTER — Telehealth: Payer: Self-pay | Admitting: Family Medicine

## 2022-11-14 ENCOUNTER — Other Ambulatory Visit: Payer: Self-pay | Admitting: Family Medicine

## 2022-11-14 ENCOUNTER — Other Ambulatory Visit (HOSPITAL_COMMUNITY): Payer: Self-pay

## 2022-11-14 ENCOUNTER — Other Ambulatory Visit: Payer: Medicare PPO

## 2022-11-14 ENCOUNTER — Other Ambulatory Visit: Payer: Self-pay

## 2022-11-14 DIAGNOSIS — R350 Frequency of micturition: Secondary | ICD-10-CM

## 2022-11-14 NOTE — Telephone Encounter (Signed)
Called pt was advised lab ordered has been placed and  She can go to [place] whenever .

## 2022-11-14 NOTE — Telephone Encounter (Signed)
Pt called stating that she has a UTI and wanted to request lab orders to have a urinalysis and urine culture done at [facility] when possible. Pt would like a call back to indicate orders have been put in.

## 2022-11-15 ENCOUNTER — Other Ambulatory Visit (HOSPITAL_COMMUNITY): Payer: Self-pay

## 2022-11-16 ENCOUNTER — Telehealth: Payer: Self-pay

## 2022-11-16 ENCOUNTER — Other Ambulatory Visit: Payer: Self-pay

## 2022-11-16 ENCOUNTER — Other Ambulatory Visit (HOSPITAL_COMMUNITY): Payer: Self-pay

## 2022-11-16 LAB — URINE CULTURE
MICRO NUMBER:: 15355653
SPECIMEN QUALITY:: ADEQUATE

## 2022-11-16 MED ORDER — CEFDINIR 300 MG PO CAPS
300.0000 mg | ORAL_CAPSULE | Freq: Two times a day (BID) | ORAL | 0 refills | Status: AC
  Filled 2022-11-16 (×2): qty 10, 5d supply, fill #0

## 2022-11-16 NOTE — Telephone Encounter (Signed)
Pt wanted to know if Dr. Abner Greenspan has checked results yet. Advised her that results just showed up a little bit ago and Dr. Abner Greenspan has not reviewed yet. Patient wanted Shamaine to know she cancelled her dentist appt because she wasn't feeling well so she is home and able to take his call when he does call her.

## 2022-11-16 NOTE — Telephone Encounter (Signed)
Spoke with Dr.Blyth regarding pt UTI per providers order to calling Cefdinir 300 mg  Bid times 5 days. Called spoke with pt was advised that Dr.Blyth is going to try it with Cefdinir 300 mg ,and pt stated understand. Med sent.

## 2022-11-16 NOTE — Telephone Encounter (Signed)
Pt called stated she is still hurting when she  Urinate, pt was advised will review results.We will call back with update later this afternoon, pt stated understand.

## 2022-11-18 ENCOUNTER — Other Ambulatory Visit (HOSPITAL_COMMUNITY): Payer: Self-pay

## 2022-11-18 ENCOUNTER — Other Ambulatory Visit: Payer: Self-pay | Admitting: Internal Medicine

## 2022-11-20 ENCOUNTER — Other Ambulatory Visit (HOSPITAL_COMMUNITY): Payer: Self-pay

## 2022-11-20 MED ORDER — HEMOCYTE PLUS 106-1 MG PO CAPS
1.0000 | ORAL_CAPSULE | Freq: Two times a day (BID) | ORAL | 1 refills | Status: DC
  Filled 2022-11-20: qty 60, 30d supply, fill #0
  Filled 2023-01-21 – 2023-02-02 (×2): qty 60, 30d supply, fill #1

## 2022-11-21 ENCOUNTER — Other Ambulatory Visit: Payer: Self-pay

## 2022-11-21 ENCOUNTER — Other Ambulatory Visit (HOSPITAL_COMMUNITY): Payer: Self-pay

## 2022-11-21 ENCOUNTER — Other Ambulatory Visit: Payer: Self-pay | Admitting: Family Medicine

## 2022-11-21 NOTE — Telephone Encounter (Signed)
Requesting: alprazolam 0.5mg   Contract:09/03/20 UDS: 11/11/20 Last Visit: 06/20/22 Next Visit: None Last Refill: 07/25/22 #30 and 0RF   Please Advise

## 2022-11-22 ENCOUNTER — Other Ambulatory Visit: Payer: Self-pay

## 2022-11-22 ENCOUNTER — Other Ambulatory Visit (HOSPITAL_COMMUNITY): Payer: Self-pay

## 2022-11-22 MED ORDER — ALPRAZOLAM 0.5 MG PO TABS
0.5000 mg | ORAL_TABLET | Freq: Every evening | ORAL | 3 refills | Status: DC | PRN
  Filled 2022-11-22: qty 30, 30d supply, fill #0
  Filled 2022-12-19: qty 30, 30d supply, fill #1
  Filled 2023-01-21 – 2023-02-02 (×2): qty 30, 30d supply, fill #2
  Filled 2023-02-28 – 2023-03-02 (×2): qty 30, 30d supply, fill #3

## 2022-11-24 ENCOUNTER — Other Ambulatory Visit (HOSPITAL_COMMUNITY): Payer: Self-pay

## 2022-11-26 ENCOUNTER — Other Ambulatory Visit: Payer: Self-pay | Admitting: Family Medicine

## 2022-11-27 ENCOUNTER — Other Ambulatory Visit (HOSPITAL_COMMUNITY): Payer: Self-pay

## 2022-11-27 ENCOUNTER — Other Ambulatory Visit: Payer: Self-pay | Admitting: Family Medicine

## 2022-11-28 ENCOUNTER — Telehealth: Payer: Self-pay | Admitting: Family Medicine

## 2022-11-28 ENCOUNTER — Other Ambulatory Visit (HOSPITAL_COMMUNITY): Payer: Self-pay

## 2022-11-28 ENCOUNTER — Other Ambulatory Visit: Payer: Self-pay

## 2022-11-28 ENCOUNTER — Other Ambulatory Visit: Payer: Medicare PPO

## 2022-11-28 DIAGNOSIS — R350 Frequency of micturition: Secondary | ICD-10-CM

## 2022-11-28 MED ORDER — AMITRIPTYLINE HCL 100 MG PO TABS
100.0000 mg | ORAL_TABLET | Freq: Every day | ORAL | 0 refills | Status: DC
  Filled 2022-11-28: qty 30, 30d supply, fill #0

## 2022-11-28 MED ORDER — FLUTICASONE PROPIONATE 50 MCG/ACT NA SUSP
1.0000 | Freq: Every day | NASAL | 3 refills | Status: AC
  Filled 2022-11-28: qty 16, 30d supply, fill #0
  Filled 2023-03-08 – 2023-03-20 (×2): qty 16, 30d supply, fill #1
  Filled 2023-11-24: qty 16, 30d supply, fill #2

## 2022-11-28 NOTE — Telephone Encounter (Signed)
Called pt was advised will need do UA ,Culture ,and orders placed for [facility].  Pt advised we will be in touch when get results.

## 2022-11-28 NOTE — Telephone Encounter (Signed)
Pt called & stated she was given Rx #: [id number] cefdinir (OMNICEF) 300 MG capsule for her UTI. However, she believes she still may have an infection. I offered to schedule her an OV with another provider but pt decline. She would like to speak with nurse or Dr. Abner Greenspan if she should take a different medication. Please call & advise with pt.

## 2022-11-28 NOTE — Addendum Note (Signed)
Addended by: Clearnce Sorrel on: 11/28/2022 05:10 PM   Modules accepted: Orders

## 2022-11-29 ENCOUNTER — Other Ambulatory Visit (HOSPITAL_COMMUNITY): Payer: Self-pay

## 2022-11-30 ENCOUNTER — Other Ambulatory Visit (HOSPITAL_COMMUNITY): Payer: Self-pay

## 2022-11-30 ENCOUNTER — Other Ambulatory Visit: Payer: Self-pay | Admitting: Family Medicine

## 2022-11-30 LAB — URINE CULTURE
MICRO NUMBER:: 15413174
SPECIMEN QUALITY:: ADEQUATE

## 2022-11-30 MED ORDER — CEFUROXIME AXETIL 250 MG PO TABS
250.0000 mg | ORAL_TABLET | Freq: Two times a day (BID) | ORAL | 0 refills | Status: AC
  Filled 2022-11-30 – 2022-12-01 (×2): qty 10, 5d supply, fill #0

## 2022-12-01 ENCOUNTER — Other Ambulatory Visit: Payer: Self-pay

## 2022-12-01 ENCOUNTER — Other Ambulatory Visit (HOSPITAL_COMMUNITY): Payer: Self-pay

## 2022-12-04 ENCOUNTER — Other Ambulatory Visit (HOSPITAL_COMMUNITY): Payer: Self-pay

## 2022-12-05 ENCOUNTER — Encounter (HOSPITAL_COMMUNITY): Payer: Self-pay | Admitting: Pharmacist

## 2022-12-05 ENCOUNTER — Other Ambulatory Visit (HOSPITAL_COMMUNITY): Payer: Self-pay

## 2022-12-06 ENCOUNTER — Other Ambulatory Visit (HOSPITAL_COMMUNITY): Payer: Self-pay

## 2022-12-07 NOTE — Telephone Encounter (Signed)
Pt called & stated that she believes she still may have a UTI. She would like to leave urine sample at the lab on [facility]. Please call & advise pt if order can be put in.

## 2022-12-08 ENCOUNTER — Other Ambulatory Visit: Payer: Medicare PPO

## 2022-12-08 ENCOUNTER — Other Ambulatory Visit: Payer: Self-pay

## 2022-12-08 ENCOUNTER — Other Ambulatory Visit (HOSPITAL_COMMUNITY): Payer: Self-pay

## 2022-12-08 DIAGNOSIS — R350 Frequency of micturition: Secondary | ICD-10-CM

## 2022-12-10 ENCOUNTER — Other Ambulatory Visit (HOSPITAL_COMMUNITY): Payer: Self-pay

## 2022-12-10 ENCOUNTER — Other Ambulatory Visit: Payer: Self-pay | Admitting: Family

## 2022-12-10 LAB — URINE CULTURE
MICRO NUMBER:: 15464325
SPECIMEN QUALITY:: ADEQUATE

## 2022-12-10 MED ORDER — CEPHALEXIN 500 MG PO CAPS
500.0000 mg | ORAL_CAPSULE | Freq: Three times a day (TID) | ORAL | 0 refills | Status: DC
  Filled 2022-12-10 – 2022-12-11 (×2): qty 21, 7d supply, fill #0

## 2022-12-11 ENCOUNTER — Other Ambulatory Visit: Payer: Self-pay

## 2022-12-11 ENCOUNTER — Other Ambulatory Visit (HOSPITAL_COMMUNITY): Payer: Self-pay

## 2022-12-12 ENCOUNTER — Other Ambulatory Visit: Payer: Self-pay

## 2022-12-13 ENCOUNTER — Other Ambulatory Visit (HOSPITAL_COMMUNITY): Payer: Self-pay

## 2022-12-13 ENCOUNTER — Other Ambulatory Visit (HOSPITAL_BASED_OUTPATIENT_CLINIC_OR_DEPARTMENT_OTHER): Payer: Self-pay | Admitting: Internal Medicine

## 2022-12-13 ENCOUNTER — Other Ambulatory Visit: Payer: Self-pay

## 2022-12-13 DIAGNOSIS — I480 Paroxysmal atrial fibrillation: Secondary | ICD-10-CM

## 2022-12-13 MED ORDER — METOPROLOL SUCCINATE ER 25 MG PO TB24
25.0000 mg | ORAL_TABLET | Freq: Every day | ORAL | 0 refills | Status: DC
  Filled 2022-12-13: qty 15, 15d supply, fill #0

## 2022-12-14 ENCOUNTER — Other Ambulatory Visit: Payer: Self-pay

## 2022-12-15 ENCOUNTER — Other Ambulatory Visit (HOSPITAL_COMMUNITY): Payer: Self-pay

## 2022-12-15 ENCOUNTER — Telehealth: Payer: Self-pay | Admitting: Internal Medicine

## 2022-12-15 DIAGNOSIS — I480 Paroxysmal atrial fibrillation: Secondary | ICD-10-CM

## 2022-12-15 NOTE — Telephone Encounter (Signed)
*  STAT* If patient is at the pharmacy, call can be transferred to refill team.   1. Which medications need to be refilled? (please list name of each medication and dose if known)  metoprolol succinate (TOPROL XL) 25 MG 24 hr tablet    2. Would you like to learn more about the convenience, safety, & potential cost savings by using the [facility]? No    3. Are you open to using the [facility] (Type [facility]. No    4. Which pharmacy/location (including street and city if local pharmacy) is medication to be sent to?  [place] [facility]     5. Do they need a 30 day or 90 day supply? Needs enough medication to last until November appointment.

## 2022-12-18 ENCOUNTER — Other Ambulatory Visit (HOSPITAL_COMMUNITY): Payer: Self-pay

## 2022-12-18 ENCOUNTER — Other Ambulatory Visit: Payer: Self-pay

## 2022-12-18 MED ORDER — METOPROLOL SUCCINATE ER 25 MG PO TB24
25.0000 mg | ORAL_TABLET | Freq: Every day | ORAL | 0 refills | Status: DC
  Filled 2022-12-18: qty 90, 90d supply, fill #0
  Filled 2022-12-22: qty 30, 30d supply, fill #0
  Filled 2022-12-25 – 2022-12-26 (×2): qty 90, 90d supply, fill #0

## 2022-12-19 ENCOUNTER — Other Ambulatory Visit (HOSPITAL_COMMUNITY): Payer: Self-pay

## 2022-12-20 ENCOUNTER — Other Ambulatory Visit (HOSPITAL_COMMUNITY): Payer: Self-pay

## 2022-12-20 ENCOUNTER — Other Ambulatory Visit: Payer: Self-pay

## 2022-12-22 ENCOUNTER — Other Ambulatory Visit (HOSPITAL_COMMUNITY): Payer: Self-pay

## 2022-12-22 ENCOUNTER — Other Ambulatory Visit: Payer: Self-pay

## 2022-12-25 ENCOUNTER — Other Ambulatory Visit (HOSPITAL_COMMUNITY): Payer: Self-pay

## 2022-12-25 ENCOUNTER — Other Ambulatory Visit: Payer: Self-pay | Admitting: Family Medicine

## 2022-12-26 ENCOUNTER — Other Ambulatory Visit (HOSPITAL_COMMUNITY): Payer: Self-pay

## 2022-12-26 ENCOUNTER — Other Ambulatory Visit: Payer: Self-pay

## 2022-12-27 ENCOUNTER — Ambulatory Visit: Payer: Medicare PPO | Admitting: Family Medicine

## 2022-12-27 ENCOUNTER — Other Ambulatory Visit: Payer: Self-pay

## 2022-12-27 ENCOUNTER — Encounter: Payer: Self-pay | Admitting: Family Medicine

## 2022-12-27 ENCOUNTER — Ambulatory Visit (INDEPENDENT_AMBULATORY_CARE_PROVIDER_SITE_OTHER): Payer: Medicare PPO | Admitting: Family Medicine

## 2022-12-27 VITALS — BP 128/70 | HR 98 | Ht 65.0 in | Wt 184.0 lb

## 2022-12-27 DIAGNOSIS — G4700 Insomnia, unspecified: Secondary | ICD-10-CM | POA: Diagnosis not present

## 2022-12-27 DIAGNOSIS — F411 Generalized anxiety disorder: Secondary | ICD-10-CM

## 2022-12-27 MED ORDER — AMITRIPTYLINE HCL 100 MG PO TABS
100.0000 mg | ORAL_TABLET | Freq: Every day | ORAL | 0 refills | Status: DC
  Filled 2022-12-27: qty 90, 90d supply, fill #0

## 2022-12-27 NOTE — Progress Notes (Signed)
Acute Office Visit  Subjective:     Patient ID: Alexis Olson, female    DOB: 1933-05-05, 89 y.o.   MRN: [id number]  Chief Complaint  Patient presents with   Medical Management of Chronic Issues    HPI Patient is in today for insomnia and mood follow-up with refills.  Discussed the use of AI scribe software for clinical note transcription with the patient, who gave verbal consent to proceed.  History of Present Illness   The patient, with a long-standing history of anxiety and insomnia, presented for a medication refill. She has been on amitriptyline for several years and expressed a desire to continue the medication, citing its effectiveness in managing her depressive symptoms. The patient denied any thoughts of self-harm and reported her mood as "pretty good," although she expressed some apprehension about the upcoming winter season.  The patient also reported sleep disturbances, for which she takes Ambien. She mentioned that she is not a natural sleeper and requires medication to help her sleep. She also takes Xanax for her anxiety, but states she is up to date on refills for both of these.    She is scheduled for a physical examination next month and prefers to wait until then to do any blood work.           ROS All review of systems negative except what is listed in the HPI      Objective:    BP 128/70   Pulse 98   Ht 5\' 5"  (1.651 m)   Wt 184 lb (83.5 kg)   SpO2 98%   BMI 30.62 kg/m    Physical Exam Vitals reviewed.  Constitutional:      Appearance: Normal appearance.  Cardiovascular:     Rate and Rhythm: Normal rate and regular rhythm.     Heart sounds: Normal heart sounds.  Pulmonary:     Effort: Pulmonary effort is normal.     Breath sounds: Normal breath sounds.  Skin:    General: Skin is warm and dry.  Neurological:     Mental Status: She is alert and oriented to person, place, and time.  Psychiatric:        Mood and Affect: Mood normal.         Behavior: Behavior normal.        Thought Content: Thought content normal.        Judgment: Judgment normal.     No results found for any visits on 12/27/22.      Assessment & Plan:   Problem List Items Addressed This Visit       Active Problems   Anxiety state   Relevant Medications   amitriptyline (ELAVIL) 100 MG tablet   INSOMNIA - Primary   Relevant Medications   amitriptyline (ELAVIL) 100 MG tablet  Stable on Amitriptyline 100mg  daily. No suicidal ideation. Mood is generally good, but patient reports dreading the upcoming winter. Amitriptyline is helping her insomnia along with her PRN medications -Refill Amitriptyline 100mg  daily. -Continue to monitor mood.  Meds ordered this encounter  Medications   amitriptyline (ELAVIL) 100 MG tablet    Sig: Take 1 tablet (100 mg total) by mouth daily.    Dispense:  90 tablet    Refill:  0    Return for - keep upcoming appointment for CPE/labs.  Clayborne Dana, NP

## 2022-12-28 ENCOUNTER — Other Ambulatory Visit (HOSPITAL_COMMUNITY): Payer: Self-pay

## 2022-12-30 ENCOUNTER — Other Ambulatory Visit (HOSPITAL_COMMUNITY): Payer: Self-pay

## 2022-12-30 ENCOUNTER — Other Ambulatory Visit: Payer: Self-pay | Admitting: Internal Medicine

## 2022-12-30 MED ORDER — DIPHENOXYLATE-ATROPINE 2.5-0.025 MG PO TABS
1.0000 | ORAL_TABLET | Freq: Four times a day (QID) | ORAL | 1 refills | Status: DC
  Filled 2022-12-30: qty 180, 23d supply, fill #0
  Filled 2023-03-08: qty 180, 23d supply, fill #1

## 2023-01-01 ENCOUNTER — Other Ambulatory Visit (HOSPITAL_COMMUNITY): Payer: Self-pay

## 2023-01-02 ENCOUNTER — Other Ambulatory Visit: Payer: Self-pay

## 2023-01-06 ENCOUNTER — Other Ambulatory Visit (HOSPITAL_COMMUNITY): Payer: Self-pay

## 2023-01-10 ENCOUNTER — Other Ambulatory Visit (HOSPITAL_COMMUNITY): Payer: Self-pay

## 2023-01-11 ENCOUNTER — Other Ambulatory Visit (HOSPITAL_COMMUNITY): Payer: Self-pay

## 2023-01-11 MED ORDER — MONTELUKAST SODIUM 10 MG PO TABS
10.0000 mg | ORAL_TABLET | Freq: Every evening | ORAL | 0 refills | Status: DC
  Filled 2023-01-11: qty 90, 90d supply, fill #0

## 2023-01-15 ENCOUNTER — Other Ambulatory Visit: Payer: Self-pay | Admitting: Family Medicine

## 2023-01-15 ENCOUNTER — Other Ambulatory Visit (HOSPITAL_COMMUNITY): Payer: Self-pay

## 2023-01-22 ENCOUNTER — Other Ambulatory Visit (HOSPITAL_COMMUNITY): Payer: Self-pay

## 2023-01-22 ENCOUNTER — Other Ambulatory Visit: Payer: Self-pay

## 2023-01-23 ENCOUNTER — Other Ambulatory Visit: Payer: Self-pay

## 2023-01-26 ENCOUNTER — Other Ambulatory Visit (HOSPITAL_COMMUNITY): Payer: Self-pay

## 2023-02-01 ENCOUNTER — Telehealth: Payer: Self-pay | Admitting: Family Medicine

## 2023-02-01 NOTE — Telephone Encounter (Signed)
Reviewed triage note Please advise pt if she has another episode tomorrow before she can see [place] -- to just go to the ED please

## 2023-02-01 NOTE — Telephone Encounter (Signed)
Pt advised to be seen within 24 hrs.  She has appt scheduled with Lillia Abed tomorrow.

## 2023-02-01 NOTE — Telephone Encounter (Signed)
Spoke with patient, verbalized understanding

## 2023-02-01 NOTE — Telephone Encounter (Signed)
FYI: This call has been transferred to triage nurse: the Triage Nurse. Once the result note has been entered staff can address the message at that time.  Patient called in with the following symptoms:  Red Word:dizziness  and Nausea   Please advise at Mobile [phone] (mobile)  Message is routed to Provider Pool.

## 2023-02-01 NOTE — Telephone Encounter (Signed)
Provider Danise Edge - MD Contact Type Call Who Is Calling Patient / Member / Family / Caregiver Call Type Triage / Clinical Caller Name Margreat Widener Relationship To Patient Self Return Phone Number [phone] (Primary) Chief Complaint Dizziness Reason for Call Symptomatic / Request for Health Information Initial Comment Caller states she has been having dizziness and nausea when she sits up to get out of bed. This is the 2nd time it has happened. Translation No Nurse Assessment Nurse: Zena Amos, RN, Margaret Date/Time (Eastern Time): 02/01/2023 1:58:29 PM Confirm and document reason for call. If symptomatic, describe symptoms. ---Caller states she has had two episodes of dizziness and nausea, lasted about 10 min, one today and one a few days ago. Does the patient have any new or worsening symptoms? ---Yes Will a triage be completed? ---Yes Related visit to physician within the last 2 weeks? ---No Does the PT have any chronic conditions? (i.e. diabetes, asthma, this includes High risk factors for pregnancy, etc.) ---No Is this a behavioral health or substance abuse call? ---No Guidelines Guideline Title Affirmed Question Affirmed Notes Nurse Date/Time (Eastern Time) Dizziness - Lightheadedness [1] MODERATE dizziness (e.g., interferes with normal activities) AND [2] has NOT been evaluated by doctor (or NP/PA) for this (Exception: Dizziness caused by heat exposure, sudden Zena Amos, RNClaris Che 02/01/2023 1:59:48 PM PLEASE NOTE: All timestamps contained within this report are represented as [place] Standard Time. CONFIDENTIALTY NOTICE: This fax transmission is intended only for the addressee. It contains information that is legally privileged, confidential or otherwise protected from use or disclosure. If you are not the intended recipient, you are strictly prohibited from reviewing, disclosing, copying using or disseminating any of this information or taking any  action in reliance on or regarding this information. If you have received this fax in error, please notify us immediately by telephone so that we can arrange for its return to [place]. Phone: [phone], Toll-Free: [phone], Fax: [phone] Page: 2 of 2 Call Id: [id number] Guidelines Guideline Title Affirmed Question Affirmed Notes Nurse Date/Time Lamount Cohen Time) standing, or poor fluid intake.) Disp. Time Lamount Cohen Time) Disposition Final User 02/01/2023 2:01:59 PM See PCP within 24 Hours Yes Zena Amos, RN, Claris Che Final Disposition 02/01/2023 2:01:59 PM See PCP within 24 Hours Yes [place], RN, Ruel Favors Disagree/Comply Comply Caller Understands Yes PreDisposition Call Doctor Care Advice Given Per Guideline SEE PCP WITHIN 24 HOURS: * IF OFFICE WILL BE OPEN: You need to be examined within the next 24 hours. Call your doctor (or NP/PA) when the office opens and make an appointment. DRINK FLUIDS: * Drink several glasses of fruit juice, other clear fluids or water. LIE DOWN AND REST: * Lie down with feet elevated for 1 hour. * This will improve blood flow through the body and to the brain. CALL BACK IF: * You pass out (faint) or are too weak to stand * You become worse CARE ADVICE given per Dizziness - Lightheadedness (Adult) guideline. Comments User: Ronni Rumble, RN Date/Time Lamount Cohen Time): 02/01/2023 2:02:54 PM Caller is going to call and try and get an appointment tomorrow. Referrals REFERRED TO PCP OFFICE

## 2023-02-01 NOTE — Telephone Encounter (Signed)
Pt was advised to be seen by tomorrow. She has an OV with Lillia Abed tomorrow at [facility].

## 2023-02-02 ENCOUNTER — Other Ambulatory Visit (HOSPITAL_BASED_OUTPATIENT_CLINIC_OR_DEPARTMENT_OTHER): Payer: Self-pay

## 2023-02-02 ENCOUNTER — Ambulatory Visit (INDEPENDENT_AMBULATORY_CARE_PROVIDER_SITE_OTHER): Payer: Medicare PPO | Admitting: Physician Assistant

## 2023-02-02 ENCOUNTER — Other Ambulatory Visit (HOSPITAL_COMMUNITY): Payer: Self-pay

## 2023-02-02 ENCOUNTER — Encounter: Payer: Self-pay | Admitting: Physician Assistant

## 2023-02-02 VITALS — BP 138/81 | HR 80 | Temp 97.6°F | Ht 65.0 in | Wt 180.2 lb

## 2023-02-02 DIAGNOSIS — R42 Dizziness and giddiness: Secondary | ICD-10-CM

## 2023-02-02 MED ORDER — MECLIZINE HCL 25 MG PO TABS
25.0000 mg | ORAL_TABLET | Freq: Three times a day (TID) | ORAL | 0 refills | Status: AC | PRN
  Filled 2023-02-02: qty 30, 10d supply, fill #0

## 2023-02-02 NOTE — Progress Notes (Unsigned)
Established patient visit   Patient: Alexis Olson   DOB: [birth year]   89 y.o. Female  MRN: [id number] Visit Date: 02/02/2023  Today's healthcare provider: Alfredia Ferguson, PA-C   Chief Complaint  Patient presents with   Dizziness    he has been having dizziness and nausea when she sits up to get out of bed. This is the 2nd time it has happened.    Subjective    Pt reports  Dizziness when sitting up 20 min overall imbalanced, first time n/v  No urinary symptoms Chest pain HA   Medications: Outpatient Medications Prior to Visit  Medication Sig   acetaminophen (TYLENOL) 500 MG tablet Take 2 tablets (1,000 mg total) by mouth every 8 (eight) hours. (Patient taking differently: Take 1,000 mg by mouth every 8 (eight) hours as needed for mild pain (pain score 1-3).)   albuterol (PROVENTIL HFA;VENTOLIN HFA) 108 (90 BASE) MCG/ACT inhaler Inhale 1-2 puffs into the lungs every 6 (six) hours as needed for wheezing or shortness of breath.   ALPRAZolam (XANAX) 0.5 MG tablet Take 1 tablet by mouth at bedtime as needed for anxiety.   amitriptyline (ELAVIL) 100 MG tablet Take 1 tablet (100 mg total) by mouth daily.   apixaban (ELIQUIS) 5 MG TABS tablet Take 1 tablet (5 mg total) by mouth every 12 (twelve) hours.   azelastine (OPTIVAR) 0.05 % ophthalmic solution Place 1 drop into both eyes daily as needed (for dry eyes).   budesonide-formoterol (SYMBICORT) 160-4.5 MCG/ACT inhaler Inhale 2 puffs into the lungs See admin instructions. Inhale 2 puffs into the lungs one to two times a day   cetirizine (ZYRTEC) 10 MG tablet Take 10 mg by mouth daily.    cyanocobalamin (DODEX) 1000 MCG/ML injection Inject 1 mL (1,000 mcg total) into the muscle every 30 (thirty) days.   dicyclomine (BENTYL) 20 MG tablet Take 0.5-1 tablets (10-20 mg total) by mouth 3 (three) times daily as needed.   diphenoxylate-atropine (LOMOTIL) 2.5-0.025 MG tablet Take 1-2 tablets by mouth up to 4 (four) times daily.   docusate  sodium (COLACE) 100 MG capsule Take 1 capsule (100 mg total) by mouth 2 (two) times daily.   Fe Fum-FA-B Cmp-C-Zn-Mg-Mn-Cu (HEMOCYTE PLUS) 106-1 MG CAPS Take 1 capsule by mouth 2 (two) times daily.   fluticasone (FLONASE) 50 MCG/ACT nasal spray Place 1-2 sprays into both nostrils daily.   gabapentin (NEURONTIN) 300 MG capsule Take 1 capsule (300 mg total) by mouth at bedtime.   hydrOXYzine (ATARAX) 25 MG tablet Take 1 tablet (25 mg total) by mouth 3 (three) times daily.   levocetirizine (XYZAL) 5 MG tablet Take 1 tablet (5 mg total) by mouth every evening.   magnesium oxide (MAG-OX) 400 MG tablet Take 400 mg by mouth 2 (two) times daily.   methocarbamol (ROBAXIN) 500 MG tablet Take 1 tablet (500 mg total) by mouth 3 (three) times daily as needed.   metoprolol succinate (TOPROL XL) 25 MG 24 hr tablet Take 1 tablet (25 mg total) by mouth daily.   montelukast (SINGULAIR) 10 MG tablet Take 1 tablet (10 mg total) by mouth every evening.   omeprazole (PRILOSEC) 20 MG capsule Take 1 capsule (20 mg total) by mouth daily.   ondansetron (ZOFRAN-ODT) 4 MG disintegrating tablet Take 1 tablet (4 mg total) by mouth 3 (three) times daily as needed for nausea or vomiting.   potassium chloride SA (KLOR-CON M) 20 MEQ tablet Take 1 tablet (20 mEq total) by mouth daily.  Probiotic Product (PROBIOTIC DAILY PO) Take 1 capsule by mouth daily.   Spacer/Aero-Holding Chambers (AEROCHAMBER PLUS WITH MASK) inhaler Use as directed   terconazole (TERAZOL 7) 0.4 % vaginal cream Insert 1 applicatorful vaginally at bedtime for 7 nights.  Apply externally as well nightly.   triamcinolone (KENALOG) 0.025 % ointment Apply 1 application topically 2 (two) times daily.   Vitamin D, Ergocalciferol, (DRISDOL) 1.25 MG (50000 UNIT) CAPS capsule Take 1 capsule (50,000 Units total) by mouth every 7 (seven) days for 12 weeks.   zolpidem (AMBIEN) 5 MG tablet Take 1 tablet (5 mg total) by mouth at bedtime.   No facility-administered  medications prior to visit.    Review of Systems {Insert previous labs (optional):23779} {See past labs  Heme  Chem  Endocrine  Serology  Results Review (optional):1}   Objective    BP 138/81   Pulse 80   Temp 97.6 F (36.4 C) (Oral)   Ht 5\' 5"  (1.651 m)   Wt 180 lb 4 oz (81.8 kg)   SpO2 90%   BMI 30.00 kg/m  {Insert last BP/Wt (optional):23777}{See vitals history (optional):1}  Physical Exam Constitutional:      General: She is awake.     Appearance: She is well-developed.  HENT:     Head: Normocephalic.  Eyes:     Extraocular Movements: Extraocular movements intact.     Conjunctiva/sclera: Conjunctivae normal.     Pupils: Pupils are equal, round, and reactive to light.  Cardiovascular:     Rate and Rhythm: Normal rate and regular rhythm.     Heart sounds: Normal heart sounds.  Pulmonary:     Effort: Pulmonary effort is normal.     Breath sounds: Normal breath sounds.  Skin:    General: Skin is warm.  Neurological:     Mental Status: She is alert and oriented to person, place, and time.  Psychiatric:        Attention and Perception: Attention normal.        Mood and Affect: Mood normal.        Speech: Speech normal.        Behavior: Behavior is cooperative.     ***  No results found for any visits on 02/02/23.  Assessment & Plan    There are no diagnoses linked to this encounter.  EKG w/ pac stable 06/15/2021  No follow-ups on file.       Alfredia Ferguson, PA-C  [facility] [phone] (phone) [phone] (fax)  [facility]

## 2023-02-05 ENCOUNTER — Encounter: Payer: Self-pay | Admitting: Physician Assistant

## 2023-02-05 ENCOUNTER — Other Ambulatory Visit (HOSPITAL_COMMUNITY): Payer: Self-pay

## 2023-02-05 ENCOUNTER — Telehealth: Payer: Self-pay

## 2023-02-05 ENCOUNTER — Encounter: Payer: Self-pay | Admitting: Medical

## 2023-02-05 ENCOUNTER — Ambulatory Visit (INDEPENDENT_AMBULATORY_CARE_PROVIDER_SITE_OTHER): Payer: Medicare PPO | Admitting: Medical

## 2023-02-05 VITALS — BP 123/77 | HR 84 | Temp 97.8°F | Ht 65.0 in | Wt 180.5 lb

## 2023-02-05 DIAGNOSIS — R82998 Other abnormal findings in urine: Secondary | ICD-10-CM

## 2023-02-05 DIAGNOSIS — R42 Dizziness and giddiness: Secondary | ICD-10-CM | POA: Diagnosis not present

## 2023-02-05 DIAGNOSIS — Z87440 Personal history of urinary (tract) infections: Secondary | ICD-10-CM

## 2023-02-05 DIAGNOSIS — I4891 Unspecified atrial fibrillation: Secondary | ICD-10-CM

## 2023-02-05 DIAGNOSIS — D649 Anemia, unspecified: Secondary | ICD-10-CM | POA: Diagnosis not present

## 2023-02-05 DIAGNOSIS — E559 Vitamin D deficiency, unspecified: Secondary | ICD-10-CM | POA: Diagnosis not present

## 2023-02-05 DIAGNOSIS — F411 Generalized anxiety disorder: Secondary | ICD-10-CM | POA: Diagnosis not present

## 2023-02-05 DIAGNOSIS — G4700 Insomnia, unspecified: Secondary | ICD-10-CM | POA: Diagnosis not present

## 2023-02-05 DIAGNOSIS — R739 Hyperglycemia, unspecified: Secondary | ICD-10-CM

## 2023-02-05 DIAGNOSIS — E785 Hyperlipidemia, unspecified: Secondary | ICD-10-CM | POA: Diagnosis not present

## 2023-02-05 LAB — POC URINALSYSI DIPSTICK (AUTOMATED)
Bilirubin, UA: NEGATIVE
Blood, UA: NEGATIVE
Glucose, UA: NEGATIVE
Ketones, UA: NEGATIVE
Nitrite, UA: NEGATIVE
Protein, UA: NEGATIVE
Spec Grav, UA: <=1.005 — AB (ref 1.010–1.025)
Urobilinogen, UA: 0.2 U/dL
pH, UA: 5 (ref 5.0–8.0)

## 2023-02-05 NOTE — Progress Notes (Signed)
Subjective:    Patient ID: Alexis Olson, female    DOB: June 16, 1933, 89 y.o.   MRN: [id number]  HPI  On review of last visit pt had dizziness but that resolved and no report of reoccurrence.  Last visit A/P with lindsay below. "-     EKG 12-Lead -     Meclizine HCl; Take 1 tablet (25 mg total) by mouth 3 (three) times daily as needed for dizziness.  Dispense: 30 tablet; Refill: 0     Vertigo Intermittent episodes of vertigo upon sitting up from bed, lasting ~20 minutes, associated with nausea and vomiting. No headaches, vision changes, chest pain, or pressure. Likely benign paroxysmal positional vertigo (BPPV) due to positional triggers and absence of other concerning symptoms. Differential includes cardiac causes given atrial fibrillation history. Discussed BPPV etiology, management with meclizine, and repositioning exercises.   -EKG today sinus rhythm with PACs. This is stable from EKG 06/15/2021. Rx meclizine, and advised on epley maneuver."  Also on review pt seen for anxiety and insomnia. -pt given elavil by Hyman Hopes NP.  -pt also on ambien.  Her mood is better and sleeping well.   Hx of uti -pt gets uti sporadically. She states provider on last visit gave urine cup but she was not able to give sample.   Hx of atrial fibrillation and elevated sugar. -pt is on eliquis 5 mg daily and metoprolol xl 25 mg daily.    Review of Systems  Constitutional:  Negative for fatigue and fever.  Respiratory:  Negative for cough and shortness of breath.   Cardiovascular:  Negative for chest pain and leg swelling.  Gastrointestinal:  Negative for abdominal pain, nausea and vomiting.  Musculoskeletal:  Negative for back pain and myalgias.  Skin:  Negative for rash.  Neurological:  Negative for dizziness, light-headedness and headaches.  Hematological:  Negative for adenopathy. Does not bruise/bleed easily.  Psychiatric/Behavioral:  Negative for sleep disturbance.     Past Medical  History:  Diagnosis Date   ALLERGIC RHINITIS    ANEMIA, CHRONIC    Malabsorption related to bypass hx, B12 and iron deficiency   ANXIETY    Arthritis    ASTHMA    Bariatric surgery status    Bowel perforation (HCC) 2016   C. difficile diarrhea    Colon perforation (HCC) 08/2014   following polypetomy during colo, surgical repair   COLONIC POLYPS, ADENOMATOUS, HX OF 2010   DIVERTICULOSIS, COLON    DVT (deep venous thrombosis) (HCC) 2018   DVT (deep venous thrombosis) (HCC)    GERD (gastroesophageal reflux disease) 08/23/2015   Graded compression stocking in place    History of blood transfusion    History of migraine    History of rheumatic fever    HSV (herpes simplex virus) infection 08/16/2015   Hyperlipidemia    Hypertension    Denies   Hyponatremia 05/02/2016   Incomplete left bundle branch block (LBBB) 2016   Noted on EKG   INSOMNIA    Lumbosacral disc disease    Chronic pain; History of osteomyelitis 2010 following ESI complication   Obesity    Osteomyelitis (HCC)    Pelvic fracture (HCC)    Pneumonia    Recurrent UTI 08/23/2015   URINARY URGENCY    Ventral hernia without obstruction or gangrene 05/22/2018   VITAMIN B12 DEFICIENCY      Social History   Socioeconomic History   Marital status: Married    Spouse name: Not on file   Number  of children: 3   Years of education: Not on file   Highest education level: Not on file  Occupational History   Occupation: retired Teacher, adult education: RETIRED  Tobacco Use   Smoking status: Never   Smokeless tobacco: Never  Vaping Use   Vaping status: Never Used  Substance and Sexual Activity   Alcohol use: No    Alcohol/week: 0.0 standard drinks of alcohol   Drug use: No   Sexual activity: Never    Partners: Male  Other Topics Concern   Not on file  Social History Narrative   Lives with spouse, Indep ADLs   Supportive family nearby   No dietary restrictions   Social Determinants of Health   Financial Resource  Strain: Low Risk  (07/28/2021)   Overall Financial Resource Strain (CARDIA)    Difficulty of Paying Living Expenses: Not hard at all  Food Insecurity: No Food Insecurity (12/12/2021)   Hunger Vital Sign    Worried About Running Out of Food in the Last Year: Never true    Ran Out of Food in the Last Year: Never true  Transportation Needs: No Transportation Needs (12/12/2021)   PRAPARE - Administrator, Civil Service (Medical): No    Lack of Transportation (Non-Medical): No  Physical Activity: Insufficiently Active (07/28/2021)   Exercise Vital Sign    Days of Exercise per Week: 3 days    Minutes of Exercise per Session: 30 min  Stress: No Stress Concern Present (12/12/2021)   [employer] of Occupational Health - Occupational Stress Questionnaire    Feeling of Stress : Not at all  Social Connections: Socially Integrated (07/28/2021)   Social Connection and Isolation Panel [NHANES]    Frequency of Communication with Friends and Family: More than three times a week    Frequency of Social Gatherings with Friends and Family: More than three times a week    Attends Religious Services: More than 4 times per year    Active Member of [employer] or Organizations: Yes    Attends Banker Meetings: More than 4 times per year    Marital Status: Married  Catering manager Violence: Not At Risk (07/28/2021)   Humiliation, Afraid, Rape, and Kick questionnaire    Fear of Current or Ex-Partner: No    Emotionally Abused: No    Physically Abused: No    Sexually Abused: No    Past Surgical History:  Procedure Laterality Date   BACK SURGERY  1994   for ruptured disc   CHOLECYSTECTOMY     COLONOSCOPY     greenfield filter     ileojejunal bypass  1976   for obesity   LAPAROTOMY N/A 09/18/2014   Procedure: EXPLORATORY LAPAROTOMY WITH REPAIR OF CECIAL PERFORATION;  Surgeon: Chevis Pretty III, MD;  Location: [facility] OR;  Service: General;  Laterality: N/A;   TONSILLECTOMY     TOTAL KNEE  ARTHROPLASTY Right 08/26/2019   Procedure: RIGHT TOTAL KNEE ARTHROPLASTY, LEFT KNEE CORTISONE INJECTION;  Surgeon: Durene Romans, MD;  Location: [facility];  Service: Orthopedics;  Laterality: Right;  70 MINS   TOTAL KNEE ARTHROPLASTY Left 01/25/2021   Procedure: TOTAL KNEE ARTHROPLASTY;  Surgeon: Durene Romans, MD;  Location: [facility];  Service: Orthopedics;  Laterality: Left;   UPPER GI ENDOSCOPY     VASCULAR SURGERY Right    revascularization right lower extremity    Family History  Problem Relation Age of Onset   Cervical cancer Mother    Stroke Mother  Liver disease Sister    Kidney disease Sister    Cirrhosis Sister    Colon cancer Neg Hx    Esophageal cancer Neg Hx    Rectal cancer Neg Hx    Stomach cancer Neg Hx     Allergies  Allergen Reactions   Ativan [Lorazepam] Anaphylaxis and Other (See Comments)    Deathly allergic; "resp rate dropped dropped to 6"   Other Anaphylaxis    Red peppers--choking, also   Azithromycin Diarrhea   Benadryl [Diphenhydramine Hcl] Other (See Comments)    Paradoxical response   Ciprofloxacin Hcl Other (See Comments)    Causes yeast infection and refuses to take   Codeine Phosphate Nausea Only    Can tolerate in limited amounts   Hydrocodone Itching and Nausea And Vomiting   Hydrocodone Bit-Homatrop Mbr Itching and Nausea And Vomiting   Levaquin [Levofloxacin In D5w] Other (See Comments)    Muscle soreness   Meperidine Hcl Other (See Comments)    Reaction ??   Molds & Smuts Other (See Comments)    unsure   Sulfa Antibiotics Other (See Comments)    Joint pain   Tape Other (See Comments)    No Coban wrap (per the patient)   Ultram [Tramadol Hcl] Other (See Comments)    "Can't move joints"    Current Outpatient Medications on File Prior to Visit  Medication Sig Dispense Refill   acetaminophen (TYLENOL) 500 MG tablet Take 2 tablets (1,000 mg total) by mouth every 8 (eight) hours. (Patient taking differently: Take 1,000 mg by mouth every 8  (eight) hours as needed for mild pain (pain score 1-3).) 30 tablet 0   albuterol (PROVENTIL HFA;VENTOLIN HFA) 108 (90 BASE) MCG/ACT inhaler Inhale 1-2 puffs into the lungs every 6 (six) hours as needed for wheezing or shortness of breath.     ALPRAZolam (XANAX) 0.5 MG tablet Take 1 tablet by mouth at bedtime as needed for anxiety. 30 tablet 3   amitriptyline (ELAVIL) 100 MG tablet Take 1 tablet (100 mg total) by mouth daily. 90 tablet 0   apixaban (ELIQUIS) 5 MG TABS tablet Take 1 tablet (5 mg total) by mouth every 12 (twelve) hours. 180 tablet 3   azelastine (OPTIVAR) 0.05 % ophthalmic solution Place 1 drop into both eyes daily as needed (for dry eyes).     budesonide-formoterol (SYMBICORT) 160-4.5 MCG/ACT inhaler Inhale 2 puffs into the lungs See admin instructions. Inhale 2 puffs into the lungs one to two times a day 1 Inhaler 2   cetirizine (ZYRTEC) 10 MG tablet Take 10 mg by mouth daily.      cyanocobalamin (DODEX) 1000 MCG/ML injection Inject 1 mL (1,000 mcg total) into the muscle every 30 (thirty) days. 10 mL 1   dicyclomine (BENTYL) 20 MG tablet Take 0.5-1 tablets (10-20 mg total) by mouth 3 (three) times daily as needed. 90 tablet 3   diphenoxylate-atropine (LOMOTIL) 2.5-0.025 MG tablet Take 1-2 tablets by mouth up to 4 (four) times daily. 180 tablet 1   docusate sodium (COLACE) 100 MG capsule Take 1 capsule (100 mg total) by mouth 2 (two) times daily. 10 capsule 0   Fe Fum-FA-B Cmp-C-Zn-Mg-Mn-Cu (HEMOCYTE PLUS) 106-1 MG CAPS Take 1 capsule by mouth 2 (two) times daily. 60 capsule 1   fluticasone (FLONASE) 50 MCG/ACT nasal spray Place 1-2 sprays into both nostrils daily. 16 g 3   gabapentin (NEURONTIN) 300 MG capsule Take 1 capsule (300 mg total) by mouth at bedtime. 90 capsule 1  hydrOXYzine (ATARAX) 25 MG tablet Take 1 tablet (25 mg total) by mouth 3 (three) times daily. 90 tablet 6   levocetirizine (XYZAL) 5 MG tablet Take 1 tablet (5 mg total) by mouth every evening. 90 tablet 2    magnesium oxide (MAG-OX) 400 MG tablet Take 400 mg by mouth 2 (two) times daily.     meclizine (ANTIVERT) 25 MG tablet Take 1 tablet (25 mg total) by mouth 3 (three) times daily as needed for dizziness. 30 tablet 0   methocarbamol (ROBAXIN) 500 MG tablet Take 1 tablet (500 mg total) by mouth 3 (three) times daily as needed. 60 tablet 0   metoprolol succinate (TOPROL XL) 25 MG 24 hr tablet Take 1 tablet (25 mg total) by mouth daily. 90 tablet 0   montelukast (SINGULAIR) 10 MG tablet Take 1 tablet (10 mg total) by mouth every evening. 90 tablet 0   omeprazole (PRILOSEC) 20 MG capsule Take 1 capsule (20 mg total) by mouth daily. 90 capsule 3   ondansetron (ZOFRAN-ODT) 4 MG disintegrating tablet Take 1 tablet (4 mg total) by mouth 3 (three) times daily as needed for nausea or vomiting. 20 tablet 0   potassium chloride SA (KLOR-CON M) 20 MEQ tablet Take 1 tablet (20 mEq total) by mouth daily. 90 tablet 1   Probiotic Product (PROBIOTIC DAILY PO) Take 1 capsule by mouth daily.     Spacer/Aero-Holding Chambers (AEROCHAMBER PLUS WITH MASK) inhaler Use as directed 1 each 1   terconazole (TERAZOL 7) 0.4 % vaginal cream Insert 1 applicatorful vaginally at bedtime for 7 nights.  Apply externally as well nightly. 45 g 0   triamcinolone (KENALOG) 0.025 % ointment Apply 1 application topically 2 (two) times daily. 30 g 0   Vitamin D, Ergocalciferol, (DRISDOL) 1.25 MG (50000 UNIT) CAPS capsule Take 1 capsule (50,000 Units total) by mouth every 7 (seven) days for 12 weeks. 12 capsule 0   zolpidem (AMBIEN) 5 MG tablet Take 1 tablet (5 mg total) by mouth at bedtime. 90 tablet 1   No current facility-administered medications on file prior to visit.    BP 123/77   Pulse 84   Temp 97.8 F (36.6 C) (Oral)   Ht 5\' 5"  (1.651 m)   Wt 180 lb 8 oz (81.9 kg)   SpO2 100%   BMI 30.04 kg/m        Objective:   Physical Exam  General Mental Status- Alert. General Appearance- Not in acute distress.   Skin General:  Color- Normal Color. Moisture- Normal Moisture.  Neck Carotid Arteries- Normal color. Moisture- Normal Moisture. No carotid bruits. No JVD.  Chest and Lung Exam Auscultation: Breath Sounds:-Normal.  Cardiovascular Auscultation:Rythm- Regular. Murmurs & Other Heart Sounds:Auscultation of the heart reveals- No Murmurs.  Abdomen Inspection:-Inspeection Normal. Palpation/Percussion:Note:No mass. Palpation and Percussion of the abdomen reveal- Non Tender, Non Distended + BS, no rebound or guarding.   Neurologic Cranial Nerve exam:- CN III-XII intact(No nystagmus), symmetric smile. Strength:- 5/5 equal and symmetric strength both upper and lower extremities.       Assessment & Plan:   Patient Instructions  1. History of UTI Trace leukocytes in urine. Follow culture.Treat if infection found. - Urine Culture - POCT Urinalysis Dipstick (Automated)  2. Atrial fibrillation, unspecified type (HCC) -today on auscualtation sounds regular. On review may paroxysmal a fib. Normal rate. Continue eliquis and metoprolol. -cmp  3. Dizziness -glad to hear dizziness has resolved. Continue meclizine as needed.  4. Anxiety state -controlled with low dose xanax.  5. Insomnia, unspecified type -on ambien. Recently last visit elavil was addened.  6. Leukocytes in urine  -culture sent out.  7. Elevated sugar -A1c  Follow up date to be determined after lab review.    Esperanza Richters, PA-C

## 2023-02-05 NOTE — Patient Instructions (Addendum)
1. History of UTI Trace leukocytes in urine. Follow culture.Treat if infection found. - Urine Culture - POCT Urinalysis Dipstick (Automated)  2. Atrial fibrillation, unspecified type (HCC) -today on auscualtation sounds regular. On review may paroxysmal a fib. Normal rate. Continue eliquis and metoprolol. -cmp  3. Dizziness -glad to hear dizziness has resolved. Continue meclizine as needed.  4. Anxiety state -controlled with low dose xanax.  5. Insomnia, unspecified type -on ambien. Recently last visit elavil was addened.  6. Leukocytes in urine  -culture sent out.  7. Elevated sugar -A1c  Follow up date to be determined after lab review.

## 2023-02-05 NOTE — Telephone Encounter (Signed)
Alexis Olson handled today during her annual   No need for you to send in now!

## 2023-02-05 NOTE — Telephone Encounter (Signed)
Mrs Alexis Olson brought in a urine sample today... I did not see any notes or orders for her to bring one in...  I do remember [place] sending her home with one but I thought that was just in case.   Would you like to send this off?

## 2023-02-06 ENCOUNTER — Other Ambulatory Visit: Payer: Self-pay

## 2023-02-06 ENCOUNTER — Other Ambulatory Visit (HOSPITAL_COMMUNITY): Payer: Self-pay

## 2023-02-06 LAB — IRON: Iron: 65 ug/dL (ref 42–145)

## 2023-02-06 LAB — CBC WITH DIFFERENTIAL/PLATELET
Basophils Absolute: 0.1 10*3/uL (ref 0.0–0.1)
Basophils Relative: 1 % (ref 0.0–3.0)
Eosinophils Absolute: 0.1 10*3/uL (ref 0.0–0.7)
Eosinophils Relative: 1.7 % (ref 0.0–5.0)
HCT: 36.8 % (ref 36.0–46.0)
Hemoglobin: 12.2 g/dL (ref 12.0–15.0)
Lymphocytes Relative: 29.7 % (ref 12.0–46.0)
Lymphs Abs: 2 10*3/uL (ref 0.7–4.0)
MCHC: 33.3 g/dL (ref 30.0–36.0)
MCV: 94.5 fL (ref 78.0–100.0)
Monocytes Absolute: 0.5 10*3/uL (ref 0.1–1.0)
Monocytes Relative: 8 % (ref 3.0–12.0)
Neutro Abs: 4.1 10*3/uL (ref 1.4–7.7)
Neutrophils Relative %: 59.6 % (ref 43.0–77.0)
Platelets: 274 10*3/uL (ref 150.0–400.0)
RBC: 3.89 Mil/uL (ref 3.87–5.11)
RDW: 14.4 % (ref 11.5–15.5)
WBC: 6.8 10*3/uL (ref 4.0–10.5)

## 2023-02-06 LAB — COMPREHENSIVE METABOLIC PANEL
ALT: 12 U/L (ref 0–35)
AST: 15 U/L (ref 0–37)
Albumin: 4.1 g/dL (ref 3.5–5.2)
Alkaline Phosphatase: 59 U/L (ref 39–117)
BUN: 12 mg/dL (ref 6–23)
CO2: 28 meq/L (ref 19–32)
Calcium: 8.8 mg/dL (ref 8.4–10.5)
Chloride: 98 meq/L (ref 96–112)
Creatinine, Ser: 0.59 mg/dL (ref 0.40–1.20)
GFR: 80.83 mL/min (ref >60.00)
Glucose, Bld: 85 mg/dL (ref 70–99)
Potassium: 4.1 meq/L (ref 3.5–5.1)
Sodium: 134 meq/L — ABNORMAL LOW (ref 135–145)
Total Bilirubin: 0.4 mg/dL (ref 0.2–1.2)
Total Protein: 6.9 g/dL (ref 6.0–8.3)

## 2023-02-06 LAB — HEMOGLOBIN A1C: Hgb A1c MFr Bld: 5.2 % (ref 4.6–6.5)

## 2023-02-06 LAB — LIPID PANEL
Cholesterol: 160 mg/dL (ref 0–200)
HDL: 56 mg/dL (ref >39.00)
LDL Cholesterol: 74 mg/dL (ref 0–99)
NonHDL: 104.22
Total CHOL/HDL Ratio: 3
Triglycerides: 153 mg/dL — ABNORMAL HIGH (ref 0.0–149.0)
VLDL: 30.6 mg/dL (ref 0.0–40.0)

## 2023-02-06 LAB — URINE CULTURE
MICRO NUMBER:: 15712831
Result:: NO GROWTH
SPECIMEN QUALITY:: ADEQUATE

## 2023-02-06 LAB — VITAMIN D 25 HYDROXY (VIT D DEFICIENCY, FRACTURES): VITD: 18.68 ng/mL — ABNORMAL LOW (ref 30.00–100.00)

## 2023-02-06 MED ORDER — VITAMIN D (ERGOCALCIFEROL) 1.25 MG (50000 UNIT) PO CAPS
50000.0000 [IU] | ORAL_CAPSULE | ORAL | 0 refills | Status: DC
  Filled 2023-02-06: qty 8, 56d supply, fill #0

## 2023-02-06 MED ORDER — LEVOCETIRIZINE DIHYDROCHLORIDE 5 MG PO TABS
5.0000 mg | ORAL_TABLET | Freq: Every evening | ORAL | 0 refills | Status: DC
  Filled 2023-02-06: qty 90, 90d supply, fill #0

## 2023-02-06 NOTE — Addendum Note (Signed)
Addended by: Gwenevere Abbot on: 02/06/2023 01:06 PM   Modules accepted: Orders

## 2023-02-07 ENCOUNTER — Other Ambulatory Visit: Payer: Self-pay

## 2023-02-09 ENCOUNTER — Other Ambulatory Visit: Payer: Self-pay

## 2023-02-09 ENCOUNTER — Encounter: Payer: Self-pay | Admitting: Emergency Medicine

## 2023-02-13 ENCOUNTER — Other Ambulatory Visit (HOSPITAL_COMMUNITY): Payer: Self-pay

## 2023-02-19 NOTE — Progress Notes (Unsigned)
Cardiology Office Note:  .   Date:  02/20/2023  ID:  Alexis Olson, DOB [birth year], MRN [id number] PCP: Bradd Canary, MD  [facility] Providers Cardiologist:  Dietrich Pates, MD    Patient Profile: .      PMH PAF Hypertension Aortic stenosis Tricuspid regurgitation DVT Hyperlipidemia Obesity  S/p gatric bypass Frequent falls GERD Anxiety Diastolic dysfunction  She established with cardiology in 2022 after wearing cardiac monitor.  She is a retired Charity fundraiser.  Has a daughter who is Charity fundraiser in emergency department.  Admission 4/11-4/16/2022 with initial concern for septic arthritis but final diagnosis inflammatory arthritis.  SNF declined and home health arranged.  Readmission 4/17-4/19/2022 with headache after sudden fall in the shower.  No prodromal symptoms reported.  She had small punctuate subarachnoid hemorrhage for which neurosurgery did not recommend intervention.  Eliquis was discontinued.  Telemetry showed frequent PACs, occasional bradycardia, and one 2 to 3-second pause.  She was discharged with 30-day [employer] monitor.  Cardiology was not formally consulted during that admission but case discussed with Dr. Tenny Craw via phone.  Monitor showed predominantly normal sinus rhythm with average HR 90 bpm.  Minimum HR was 69 bpm, no significant pauses.  She had new finding of atrial fibrillation with < 1% burden and total length 2h 47 min.  Frequent PACs and rare PVCs.  She was recommended to start aspirin and referred to home health for vestibular training and objective evaluation of falls risk.  Repeat CT head 09/04/2020 showed resolution of subarachnoid hemorrhage.  Seen by Gillian Shields, NP 09/21/2020. Records revealed TKA 08/2019, hospitalized 10/2019 after accidental fall with CT of head and C-spine without acute findings.  Acute metabolic encephalopathy likely due to polypharmacy.  She had LLE acute DVT and was started on Eliquis.  Considered provoked DVT in setting of previous surgery.   Echo 10/2019 with LVEF 60 to 65%, G1 DD, mild to moderate TR, mild aortic stenosis.  Seen in ED 12/15/2019 in 01/14/2020 for cellulitis of LLE.  Venous duplex 04/08/2020 with chronic nonocclusive thrombus with septation in right and left common femoral vein and no evidence of acute DVT.  At follow-up visit 12/03/2020 she had had no further falls.  Eliquis 5 mg twice daily was resumed.  Repeat echocardiogram 11/15/2020 showed newly reduced LVEF 45 to 50%, G1 DD, trivial MR, stable mild AS. She reported hx of rheumatic fever during childhood. Using a cane due to left knee "bone on bone" with no reported falls.   Last cardiology clinic visit was 09/21/2021 with Dr. Tenny Craw.  She underwent left TKA a November 2022 and went on low-dose Eliquis 2.5 mg twice daily after surgery then resumed full dose Eliquis May 2023.  She reported she had not had any falls, she continued to use a cane.  No changes were made and treatment regimen and 1 year follow-up was recommended.       History of Present Illness: .   Alexis Olson is a very pleasant 89 y.o. female who is here today for follow-up. Says she is not sure why she is here. [place] and lasted for a couple of mornings. She did not seek medical attention at the time but saw a doctor upon returning home. An EKG was performed, which was normal, and she was diagnosed with vertigo and given meclizine. Symptoms have improved. Takes care not to stand too quickly from a sitting position.  Reports feeling  tired all the time, despite sleeping well. She takes Ambien and a small dose of Xanax to help her sleep. She denies snoring and has moved to a different room due to her husband's snoring. Has had 5 nosebleeds in the last few months, but says they do not last long and they do not cause her any symptoms.  No melena.  Has some swelling in her feet and legs at the end of the day, which improves by morning. She has  noticed that her heart sometimes "flies."  She denies chest pain, shortness of breath, orthopnea, PND, presyncope, syncope. She walks with a cane. No recent falls.    Discussed the use of AI scribe software for clinical note transcription with the patient, who gave verbal consent to proceed.   ROS: See HPI       Studies Reviewed: .        Risk Assessment/Calculations:    CHA2DS2-VASc Score = 5   This indicates a 7.2% annual risk of stroke. The patient's score is based upon: CHF History: 0 HTN History: 1 Diabetes History: 0 Stroke History: 0 Vascular Disease History: 1 Age Score: 2 Gender Score: 1            Physical Exam:   VS:  BP 118/78   Pulse 91   Ht 5\' 5"  (1.651 m)   Wt 186 lb 12.8 oz (84.7 kg)   SpO2 96%   BMI 31.09 kg/m    Wt Readings from Last 3 Encounters:  02/20/23 186 lb 12.8 oz (84.7 kg)  02/05/23 180 lb 8 oz (81.9 kg)  02/02/23 180 lb 4 oz (81.8 kg)    GEN: Well nourished, well developed in no acute distress NECK: No JVD; No carotid bruits CARDIAC: Irregular RR, systolic murmur. No rubs, gallops RESPIRATORY:  Clear to auscultation without rales, wheezing or rhonchi  ABDOMEN: Soft, non-tender, non-distended EXTREMITIES:  No edema; No deformity     ASSESSMENT AND PLAN: .    PAF on chronic anticoagulation: She reports occasional feelings of her heart "flying" that do not interfere with her regular activities.  Heart rate today is well-controlled. She is otherwise asymptomatic.  Continue metoprolol for rate control. Occasional nosebleeds that do not last long. Is seeing an allergist soon and plans to discuss with him. Continue Eliquis 5 mg twice daily which is appropriate dose for CHA2DS2-VASc score of 5.  Hypertension: BP is well controlled.  Renal function is stable on labs completed 02/05/2023.  Leg edema: Mild bilateral LE edema that improves in the mornings. Encouraged leg compression and leg elevation.   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  She appears euvolemic on exam.  She reports fatigue and mild LE edema at the end of the day. No orthopnea, PND, or dyspnea. We are updating echocardiogram in the setting of surveillance for aortic stenosis.  Continue metoprolol.   Valve disease: Mild aortic stenosis with peak gradient 17 mmHg, mild to moderate TR, mildly reduced LVEF 45 to 50% on TTE 10/2020. She reports fatigue but no chest pain, presyncope, syncope, shortness of breath, orthopnea or PND. We will get echo to evaluate valve function.        Dispo: 1 year with Dr. Tenny Craw  Signed, Eligha Bridegroom, NP-C

## 2023-02-20 ENCOUNTER — Ambulatory Visit: Payer: Medicare PPO | Attending: Nurse Practitioner | Admitting: Nurse Practitioner

## 2023-02-20 ENCOUNTER — Encounter: Payer: Self-pay | Admitting: Nurse Practitioner

## 2023-02-20 VITALS — BP 118/78 | HR 91 | Ht 65.0 in | Wt 186.8 lb

## 2023-02-20 DIAGNOSIS — I5022 Chronic systolic (congestive) heart failure: Secondary | ICD-10-CM

## 2023-02-20 DIAGNOSIS — R600 Localized edema: Secondary | ICD-10-CM | POA: Diagnosis not present

## 2023-02-20 DIAGNOSIS — Z7901 Long term (current) use of anticoagulants: Secondary | ICD-10-CM | POA: Diagnosis not present

## 2023-02-20 DIAGNOSIS — I10 Essential (primary) hypertension: Secondary | ICD-10-CM

## 2023-02-20 DIAGNOSIS — I480 Paroxysmal atrial fibrillation: Secondary | ICD-10-CM

## 2023-02-20 DIAGNOSIS — I350 Nonrheumatic aortic (valve) stenosis: Secondary | ICD-10-CM

## 2023-02-20 NOTE — Patient Instructions (Signed)
Medication Instructions:   Your physician recommends that you continue on your current medications as directed. Please refer to the Current Medication list given to you today. \  *If you need a refill on your cardiac medications before your next appointment, please call your pharmacy*   Lab Work:  None ordered.  If you have labs (blood work) drawn today and your tests are completely normal, you will receive your results only by: MyChart Message (if you have MyChart) OR A paper copy in the mail If you have any lab test that is abnormal or we need to change your treatment, we will call you to review the results.   Testing/Procedures:  Your physician has requested that you have an echocardiogram. Echocardiography is a painless test that uses sound waves to create images of your heart. It provides your doctor with information about the size and shape of your heart and how well your heart's chambers and valves are working. This procedure takes approximately one hour. There are no restrictions for this procedure. Please do NOT wear cologne, perfume or lotions (deodorant is allowed). Please arrive 15 minutes prior to your appointment time.  Please note: We ask at that you not bring children with you during ultrasound (echo/ vascular) testing. Due to room size and safety concerns, children are not allowed in the ultrasound rooms during exams. Our front office staff cannot provide observation of children in our lobby area while testing is being conducted. An adult accompanying a patient to their appointment will only be allowed in the ultrasound room at the discretion of the ultrasound technician under special circumstances. We apologize for any inconvenience.    Follow-Up: At [facility], you and your health needs are our priority.  As part of our continuing mission to provide you with exceptional heart care, we have created designated Provider Care Teams.  These Care Teams include  your primary Cardiologist (physician) and Advanced Practice Providers (APPs -  Physician Assistants and Nurse Practitioners) who all work together to provide you with the care you need, when you need it.  We recommend signing up for the patient portal called "MyChart".  Sign up information is provided on this After Visit Summary.  MyChart is used to connect with patients for Virtual Visits (Telemedicine).  Patients are able to view lab/test results, encounter notes, upcoming appointments, etc.  Non-urgent messages can be sent to your provider as well.   To learn more about what you can do with MyChart, go to [email or web address].    Your next appointment:   1 year(s)  Provider:   Dietrich Pates, MD     Other Instructions  Your physician wants you to follow-up in: 1 year.  You will receive a reminder letter in the mail two months in advance. If you don't receive a letter, please call our office to schedule the follow-up appointment.

## 2023-02-28 ENCOUNTER — Other Ambulatory Visit (HOSPITAL_COMMUNITY): Payer: Self-pay

## 2023-02-28 ENCOUNTER — Other Ambulatory Visit: Payer: Self-pay | Admitting: Family Medicine

## 2023-02-28 DIAGNOSIS — J3081 Allergic rhinitis due to animal (cat) (dog) hair and dander: Secondary | ICD-10-CM | POA: Diagnosis not present

## 2023-02-28 DIAGNOSIS — J301 Allergic rhinitis due to pollen: Secondary | ICD-10-CM | POA: Diagnosis not present

## 2023-02-28 DIAGNOSIS — J4530 Mild persistent asthma, uncomplicated: Secondary | ICD-10-CM | POA: Diagnosis not present

## 2023-02-28 DIAGNOSIS — J3089 Other allergic rhinitis: Secondary | ICD-10-CM | POA: Diagnosis not present

## 2023-03-01 ENCOUNTER — Other Ambulatory Visit (HOSPITAL_COMMUNITY): Payer: Self-pay

## 2023-03-01 ENCOUNTER — Other Ambulatory Visit: Payer: Self-pay

## 2023-03-01 MED ORDER — ZOLPIDEM TARTRATE 5 MG PO TABS
5.0000 mg | ORAL_TABLET | Freq: Every day | ORAL | 1 refills | Status: DC
  Filled 2023-03-01 – 2023-03-09 (×3): qty 90, 90d supply, fill #0
  Filled 2023-06-02 – 2023-06-05 (×2): qty 90, 90d supply, fill #1

## 2023-03-02 ENCOUNTER — Other Ambulatory Visit: Payer: Self-pay

## 2023-03-02 ENCOUNTER — Other Ambulatory Visit (HOSPITAL_COMMUNITY): Payer: Self-pay

## 2023-03-04 ENCOUNTER — Other Ambulatory Visit: Payer: Self-pay | Admitting: Family Medicine

## 2023-03-05 ENCOUNTER — Other Ambulatory Visit (HOSPITAL_COMMUNITY): Payer: Self-pay

## 2023-03-05 MED ORDER — GABAPENTIN 300 MG PO CAPS
300.0000 mg | ORAL_CAPSULE | Freq: Every day | ORAL | 1 refills | Status: DC
  Filled 2023-03-05: qty 90, 90d supply, fill #0
  Filled 2023-09-06: qty 90, 90d supply, fill #1

## 2023-03-09 ENCOUNTER — Other Ambulatory Visit (HOSPITAL_COMMUNITY): Payer: Self-pay

## 2023-03-09 ENCOUNTER — Other Ambulatory Visit: Payer: Self-pay

## 2023-03-20 ENCOUNTER — Other Ambulatory Visit (HOSPITAL_COMMUNITY): Payer: Self-pay

## 2023-03-22 ENCOUNTER — Other Ambulatory Visit (HOSPITAL_COMMUNITY): Payer: Self-pay

## 2023-03-26 ENCOUNTER — Other Ambulatory Visit: Payer: Self-pay | Admitting: Family Medicine

## 2023-03-26 DIAGNOSIS — G4700 Insomnia, unspecified: Secondary | ICD-10-CM

## 2023-03-26 DIAGNOSIS — F411 Generalized anxiety disorder: Secondary | ICD-10-CM

## 2023-03-27 ENCOUNTER — Other Ambulatory Visit: Payer: Self-pay

## 2023-03-27 ENCOUNTER — Other Ambulatory Visit (HOSPITAL_COMMUNITY): Payer: Self-pay

## 2023-03-27 MED ORDER — AMITRIPTYLINE HCL 100 MG PO TABS
100.0000 mg | ORAL_TABLET | Freq: Every day | ORAL | 0 refills | Status: DC
Start: 2023-03-27 — End: 2023-06-26
  Filled 2023-03-27: qty 90, 90d supply, fill #0

## 2023-03-30 ENCOUNTER — Ambulatory Visit: Payer: Self-pay | Admitting: Family Medicine

## 2023-03-30 NOTE — Telephone Encounter (Addendum)
 Reason for Disposition  Urinating more frequently than usual (i.e., frequency)  Answer Assessment - Initial Assessment Questions 1. SYMPTOM: What's the main symptom you're concerned about? (e.g., frequency, incontinence)     Pain and burning with urination 2. ONSET: When did the it  start?     Yesterday 3. PAIN: Is there any pain? If Yes, ask: How bad is it? (Scale: 1-10; mild, moderate, severe)     Just hurts 4. CAUSE: What do you think is causing the symptoms?     UTI 5. OTHER SYMPTOMS: Do you have any other symptoms? (e.g., blood in urine, fever, flank pain, pain with urination)     Frequency  Protocols used: Urinary Symptoms-A-AH   Chief Complaint: UTI  Symptoms: Pain and burning with urination, frequency Frequency: Ongoing since yesterday Pertinent Negatives: Patient denies fever or flank pain Disposition: [] ED /[x] Urgent Care (no appt availability in office) / [x] Appointment(In office/virtual)/ []  [place] [facility]/ [] Home Care/ [] Refused Recommended Disposition /[] [place] [facility]/ []  Follow-up with PCP Additional Notes: Patient stated that she is having pain and burning with urination and frequency since yesterday. Patient has taken Pyridium  which has helped with discomfort. Offered to schedule appt this afternoon and recommended urgent care as another option. Patient declined and stated she does not want to see anybody. She stated that she knows she has a UTI and Dr. Domenica usually just sends her to a lab for testing. Please advise if provider can send in lab orders for patient. Patient is aware that a message is being sent and the office will follow up with her. Patient was made aware that she might not hear back from the office today.

## 2023-03-30 NOTE — Telephone Encounter (Addendum)
 Was Calling patient to follow up on CRM # [id number], UTI concerns

## 2023-04-01 DIAGNOSIS — N3001 Acute cystitis with hematuria: Secondary | ICD-10-CM | POA: Diagnosis not present

## 2023-04-01 DIAGNOSIS — N3091 Cystitis, unspecified with hematuria: Secondary | ICD-10-CM | POA: Diagnosis not present

## 2023-04-02 ENCOUNTER — Other Ambulatory Visit (HOSPITAL_COMMUNITY): Payer: Self-pay

## 2023-04-02 ENCOUNTER — Other Ambulatory Visit: Payer: Self-pay | Admitting: Family Medicine

## 2023-04-02 ENCOUNTER — Other Ambulatory Visit: Payer: Self-pay

## 2023-04-02 ENCOUNTER — Telehealth: Payer: Self-pay

## 2023-04-02 DIAGNOSIS — I480 Paroxysmal atrial fibrillation: Secondary | ICD-10-CM

## 2023-04-02 MED ORDER — ALPRAZOLAM 0.5 MG PO TABS
0.5000 mg | ORAL_TABLET | Freq: Every evening | ORAL | 3 refills | Status: DC | PRN
  Filled 2023-04-02: qty 30, 30d supply, fill #0
  Filled 2023-05-01 (×2): qty 30, 30d supply, fill #1
  Filled 2023-05-30 (×2): qty 30, 30d supply, fill #2
  Filled 2023-06-29: qty 30, 30d supply, fill #3

## 2023-04-02 MED ORDER — METOPROLOL SUCCINATE ER 25 MG PO TB24
25.0000 mg | ORAL_TABLET | Freq: Every day | ORAL | 0 refills | Status: DC
  Filled 2023-04-02: qty 90, 90d supply, fill #0

## 2023-04-02 NOTE — Telephone Encounter (Signed)
 Requesting: alprazolam 0.5mg  Contract: No UDS: no Last Visit: 06/20/2022 Next Visit: no visit found Last Refill: 11/22/22  Please Advise

## 2023-04-02 NOTE — Telephone Encounter (Signed)
 Initial Comment Caller states her mother is urinating more frequently, vaginal urinary tract infection, Vaginal Burning Translation No Disp. Time Titus Time) Disposition Final User 04/01/2023 5:24:04 AM FINAL ATTEMPT MADE - message left Yes Jurovschi, RN, Ayrine 04/01/2023 5:24:13 AM Send to RN Final Attempt Vallery Boston, RN, Ayrine Final Disposition 04/01/2023 5:24:04 AM FINAL ATTEMPT MADE - message left Yes Jurovschi, RN, Ayrine

## 2023-04-03 ENCOUNTER — Other Ambulatory Visit: Payer: Self-pay | Admitting: Internal Medicine

## 2023-04-03 ENCOUNTER — Ambulatory Visit (HOSPITAL_COMMUNITY): Payer: Medicare PPO

## 2023-04-03 ENCOUNTER — Other Ambulatory Visit (HOSPITAL_COMMUNITY): Payer: Self-pay

## 2023-04-03 MED ORDER — HEMOCYTE PLUS 106-1 MG PO CAPS
1.0000 | ORAL_CAPSULE | Freq: Two times a day (BID) | ORAL | 1 refills | Status: DC
  Filled 2023-04-03: qty 60, 30d supply, fill #0
  Filled 2023-04-09: qty 60, 30d supply, fill #1

## 2023-04-03 NOTE — Telephone Encounter (Signed)
 Went to urgent care and was put on an antibiotic.  She is doing better.    She stated that her hands are blue and usually this happens when her hemoglobin is low.  She would like to just get lab work done.  Advised that she needs to be seen before this can happen so we can do the proper blood work.  Appointment made for 04/06/23 with [place].

## 2023-04-04 ENCOUNTER — Other Ambulatory Visit: Payer: Self-pay

## 2023-04-04 ENCOUNTER — Other Ambulatory Visit (HOSPITAL_COMMUNITY): Payer: Self-pay

## 2023-04-05 ENCOUNTER — Other Ambulatory Visit: Payer: Self-pay

## 2023-04-06 ENCOUNTER — Ambulatory Visit: Payer: Medicare PPO | Admitting: Medical

## 2023-04-09 ENCOUNTER — Other Ambulatory Visit (HOSPITAL_COMMUNITY): Payer: Self-pay

## 2023-04-10 ENCOUNTER — Ambulatory Visit: Payer: Medicare PPO

## 2023-04-11 ENCOUNTER — Other Ambulatory Visit: Payer: Self-pay

## 2023-04-11 ENCOUNTER — Other Ambulatory Visit (HOSPITAL_COMMUNITY): Payer: Self-pay

## 2023-04-12 ENCOUNTER — Other Ambulatory Visit (HOSPITAL_COMMUNITY): Payer: Self-pay

## 2023-04-12 ENCOUNTER — Other Ambulatory Visit: Payer: Self-pay

## 2023-04-12 ENCOUNTER — Other Ambulatory Visit (HOSPITAL_BASED_OUTPATIENT_CLINIC_OR_DEPARTMENT_OTHER): Payer: Self-pay

## 2023-04-13 ENCOUNTER — Other Ambulatory Visit (HOSPITAL_COMMUNITY): Payer: Self-pay

## 2023-04-17 ENCOUNTER — Other Ambulatory Visit (HOSPITAL_COMMUNITY): Payer: Self-pay

## 2023-04-17 ENCOUNTER — Other Ambulatory Visit: Payer: Self-pay | Admitting: Medical

## 2023-04-18 ENCOUNTER — Other Ambulatory Visit (HOSPITAL_COMMUNITY): Payer: Self-pay

## 2023-04-18 ENCOUNTER — Other Ambulatory Visit: Payer: Self-pay

## 2023-04-18 MED ORDER — VITAMIN D (ERGOCALCIFEROL) 1.25 MG (50000 UNIT) PO CAPS
50000.0000 [IU] | ORAL_CAPSULE | ORAL | 0 refills | Status: DC
  Filled 2023-04-18: qty 8, 56d supply, fill #0

## 2023-04-18 MED ORDER — MONTELUKAST SODIUM 10 MG PO TABS
10.0000 mg | ORAL_TABLET | Freq: Every day | ORAL | 6 refills | Status: DC
  Filled 2023-04-18: qty 90, 90d supply, fill #0
  Filled 2023-07-16: qty 90, 90d supply, fill #1
  Filled 2023-10-10: qty 30, 30d supply, fill #2

## 2023-04-25 ENCOUNTER — Ambulatory Visit (HOSPITAL_COMMUNITY): Payer: Medicare HMO | Attending: Nurse Practitioner

## 2023-04-25 DIAGNOSIS — I10 Essential (primary) hypertension: Secondary | ICD-10-CM | POA: Insufficient documentation

## 2023-04-25 DIAGNOSIS — I350 Nonrheumatic aortic (valve) stenosis: Secondary | ICD-10-CM | POA: Diagnosis not present

## 2023-04-25 DIAGNOSIS — I480 Paroxysmal atrial fibrillation: Secondary | ICD-10-CM | POA: Diagnosis not present

## 2023-04-26 LAB — ECHOCARDIOGRAM COMPLETE
AR max vel: 1.17 cm2
AV Area VTI: 1.1 cm2
AV Area mean vel: 1.08 cm2
AV Mean grad: 15.2 mm[Hg]
AV Peak grad: 22.3 mm[Hg]
Ao pk vel: 2.36 m/s
Area-P 1/2: 4.36 cm2
Est EF: 55
S' Lateral: 2.3 cm

## 2023-04-30 ENCOUNTER — Other Ambulatory Visit: Payer: Self-pay | Admitting: Emergency Medicine

## 2023-04-30 ENCOUNTER — Other Ambulatory Visit: Payer: Medicare HMO

## 2023-04-30 ENCOUNTER — Ambulatory Visit: Payer: Self-pay | Admitting: Family Medicine

## 2023-04-30 ENCOUNTER — Other Ambulatory Visit (INDEPENDENT_AMBULATORY_CARE_PROVIDER_SITE_OTHER): Payer: Medicare HMO

## 2023-04-30 DIAGNOSIS — R350 Frequency of micturition: Secondary | ICD-10-CM | POA: Diagnosis not present

## 2023-04-30 NOTE — Telephone Encounter (Signed)
Copied from CRM [id number]. Topic: Clinical - Red Word Triage >> Apr 30, 2023 12:44 PM Clayton Bibles wrote: Red Word that prompted transfer to Nurse Triage: Real bad pain to urinate, going to bathroom a lot, Odor in urine,   Chief Complaint: UTI symptoms Symptoms: frequency, odor in urine Frequency: x about 3 days Pertinent Negatives: Patient denies nausea, vomiting, diarrhea, blood in urine, fever Disposition: [] ED /[] Urgent Care (no appt availability in office) / [] Appointment(In office/virtual)/ []  [facility]/ [] Home Care/ [x] Refused Recommended Disposition /[]  Mobile [facility]/ []  Follow-up with PCP Additional Notes: Patient called and advised that she believes she has a UTI. Symptoms started 3 days ago and have gotten worse.  Patient has been experiencing urinary frequency, odor in her urine, and pain with urination.  Patient only experiences pain with urination.  Patient denies any nausea, vomiting, diarrhea, blood in her urine, or fever.  Patient also denies any abdominal pain, flank pain, or back pain.  She states that she normally gets UTIs and she normally speaks with her PCP's direct nurse who handles her situation, getting her to bring in a urine sample, etc.  Patient was advised that this RN didn't have an available opening for her PCP in the next 24 hours and asked if she would be okay scheduling with a different provider in the same office.  Patient states that she wanted her PCP's Nurse to call her back because they usually take care of her when she calls with UTI symptoms.  Patient was given home care advice that she was already familiar with. She refused urgent care and she she was also advised that if she got worse to go to the emergency room.  Patient states she wasn't going to the ER today and just wanted a call back from her PCP/PCP's Nurse.   Reason for Disposition  Age > 50 years  Answer Assessment - Initial Assessment Questions 1. SEVERITY: "How bad is the pain?"   (e.g., Scale 1-10; mild, moderate, or severe)   - MILD (1-3): complains slightly about urination hurting   - MODERATE (4-7): interferes with normal activities     - SEVERE (8-10): excruciating, unwilling or unable to urinate because of the pain      Only when urinating  7 out of 10 2. FREQUENCY: "How many times have you had painful urination today?"      multiple 3. PATTERN: "Is pain present every time you urinate or just sometimes?"      yes 4. ONSET: "When did the painful urination start?"      Yesterday started getting worse 5. FEVER: "Do you have a fever?" If Yes, ask: "What is your temperature, how was it measured, and when did it start?"     no 6. PAST UTI: "Have you had a urine infection before?" If Yes, ask: "When was the last time?" and "What happened that time?"      Yes off and on 7. CAUSE: "What do you think is causing the painful urination?"  (e.g., UTI, scratch, Herpes sore)     Possible UTI 8. OTHER SYMPTOMS: "Do you have any other symptoms?" (e.g., blood in urine, flank pain, genital sores, urgency, vaginal discharge)     na  Protocols used: Urination Pain - Female-A-AH

## 2023-04-30 NOTE — Telephone Encounter (Signed)
Called patient back. Per verbal order from Dr. Abner Greenspan, A urinalysis and urine culture were ordered. Patient is coming in tomorrow to leave a sample in the lab

## 2023-05-01 ENCOUNTER — Other Ambulatory Visit: Payer: Self-pay | Admitting: Family Medicine

## 2023-05-01 ENCOUNTER — Other Ambulatory Visit (HOSPITAL_COMMUNITY): Payer: Self-pay

## 2023-05-01 ENCOUNTER — Other Ambulatory Visit: Payer: Self-pay

## 2023-05-01 ENCOUNTER — Other Ambulatory Visit: Payer: Medicare HMO

## 2023-05-01 DIAGNOSIS — N390 Urinary tract infection, site not specified: Secondary | ICD-10-CM

## 2023-05-01 LAB — URINALYSIS, ROUTINE W REFLEX MICROSCOPIC
Bilirubin Urine: NEGATIVE
Ketones, ur: NEGATIVE
Nitrite: POSITIVE — AB
Specific Gravity, Urine: 1.01 (ref 1.000–1.030)
Total Protein, Urine: 30 — AB
Urine Glucose: NEGATIVE
Urobilinogen, UA: 0.2 (ref 0.0–1.0)
pH: 6 (ref 5.0–8.0)

## 2023-05-01 MED ORDER — CEPHALEXIN 500 MG PO CAPS: 500.0000 mg | ORAL_CAPSULE | Freq: Two times a day (BID) | ORAL | 0 refills | Status: DC

## 2023-05-01 NOTE — Telephone Encounter (Signed)
Urine dip back awaiting culture.

## 2023-05-01 NOTE — Telephone Encounter (Signed)
 Copied from CRM [id number]. Topic: Clinical - Medical Advice >> May 01, 2023  2:40 PM Brittany M wrote: Reason for CRM: Patient wanting to know if she was going to get medication for a UTI, calling back today about a urine sample left yesterday. Please reach out to patient

## 2023-05-02 ENCOUNTER — Telehealth: Payer: Self-pay | Admitting: Emergency Medicine

## 2023-05-02 ENCOUNTER — Encounter: Payer: Self-pay | Admitting: Family Medicine

## 2023-05-02 ENCOUNTER — Other Ambulatory Visit (HOSPITAL_COMMUNITY): Payer: Self-pay

## 2023-05-02 NOTE — Telephone Encounter (Signed)
 Called patient. She picked up her medication yesterday (2//2025)

## 2023-05-02 NOTE — Telephone Encounter (Signed)
 Results were given to patient yesterday (05/01/2023) and she picked up her prescription also.

## 2023-05-02 NOTE — Telephone Encounter (Signed)
 Called patient yesterday (05/01/2023) and she was given results of her urinalysis. She was told we are still waiting for the culture to be resulted.

## 2023-05-02 NOTE — Telephone Encounter (Signed)
Lmom on machine that medication has been sent in.

## 2023-05-02 NOTE — Telephone Encounter (Signed)
 Copied from CRM [id number]. Topic: Clinical - Lab/Test Results >> May 01, 2023 11:11 AM Corin V wrote: Reason for CRM: Patient called to get her lab results for her UTI specimen yesterday. Agent advised results came in this morning and Dr. Carleton has not had a chance to review them. She asked that she be called when Dr. Carleton reviews them and sends something to the [facility] pharmacy.

## 2023-05-02 NOTE — Telephone Encounter (Signed)
 Copied from CRM [id number]. Topic: Clinical - Lab/Test Results >> May 01, 2023  5:20 PM Alexis Olson wrote: Reason for CRM: Pt is calling for UTI lab results and medication. Please call pt [phone].

## 2023-05-02 NOTE — Telephone Encounter (Signed)
 Copied from CRM [id number]. Topic: Clinical - Medication Question >> May 01, 2023  5:55 PM Corin V wrote: Reason for CRM: Patient called in and stated she still has not heard from the clinic about her medication and [facility] closes at 6pm. Agent advised that they do see messages sent to the office but they were unable to get a hold of CAL. Per K[facility], office is open until 7pm today. Patient asked if the Rx could be sent to the [facility] or [facility] [address] in [facility] as they are open 24/7 and to call patient back to let her know which pharmacy it is sent in to.

## 2023-05-03 ENCOUNTER — Other Ambulatory Visit: Payer: Self-pay | Admitting: Family

## 2023-05-03 LAB — URINE CULTURE
MICRO NUMBER:: 16032850
SPECIMEN QUALITY:: ADEQUATE

## 2023-05-03 MED ORDER — CEFUROXIME AXETIL 250 MG PO TABS
250.0000 mg | ORAL_TABLET | Freq: Two times a day (BID) | ORAL | 0 refills | Status: AC
  Filled 2023-05-03: qty 20, 10d supply, fill #0

## 2023-05-04 ENCOUNTER — Other Ambulatory Visit: Payer: Self-pay

## 2023-05-04 ENCOUNTER — Encounter: Payer: Self-pay | Admitting: Pharmacist

## 2023-05-04 ENCOUNTER — Other Ambulatory Visit (HOSPITAL_COMMUNITY): Payer: Self-pay

## 2023-05-05 ENCOUNTER — Other Ambulatory Visit (HOSPITAL_COMMUNITY): Payer: Self-pay

## 2023-05-06 ENCOUNTER — Other Ambulatory Visit (HOSPITAL_COMMUNITY): Payer: Self-pay

## 2023-05-06 MED ORDER — LEVOCETIRIZINE DIHYDROCHLORIDE 5 MG PO TABS
5.0000 mg | ORAL_TABLET | Freq: Every evening | ORAL | 3 refills | Status: DC
  Filled 2023-05-06: qty 90, 90d supply, fill #0
  Filled 2023-07-17 – 2023-08-06 (×2): qty 90, 90d supply, fill #1
  Filled 2023-11-06: qty 90, 90d supply, fill #2
  Filled 2024-02-01: qty 90, 90d supply, fill #3

## 2023-05-07 ENCOUNTER — Other Ambulatory Visit (HOSPITAL_COMMUNITY): Payer: Self-pay

## 2023-05-07 ENCOUNTER — Other Ambulatory Visit: Payer: Self-pay

## 2023-05-07 ENCOUNTER — Telehealth: Payer: Self-pay

## 2023-05-07 MED ORDER — AMOXICILLIN 500 MG PO CAPS
500.0000 mg | ORAL_CAPSULE | Freq: Three times a day (TID) | ORAL | 0 refills | Status: AC
Start: 2023-05-07 — End: 2023-05-14
  Filled 2023-05-07: qty 21, 7d supply, fill #0

## 2023-05-07 NOTE — Telephone Encounter (Signed)
 Copied from CRM [id number]. Topic: Clinical - Prescription Issue >> May 07, 2023  1:33 PM Crist Dominion wrote: Reason for CRM: Patients daughter is requesting an update on patients antibiotic that Dr. Nathalie Baize is going to prescribe - Please send medication to [facility] [address] [place] [address] Phone: [phone] Fax: [phone] Hours: Mon-Fri 7:30am-6pm; Sat 8:00am-4:30pm

## 2023-05-07 NOTE — Telephone Encounter (Signed)
 Called and spoke with pharmacist at [facility]. They said the prescription was mailed out and arrived at the residence this morning at 9:21 am. Daughter was called and notified.

## 2023-05-09 DIAGNOSIS — Z961 Presence of intraocular lens: Secondary | ICD-10-CM | POA: Diagnosis not present

## 2023-05-09 DIAGNOSIS — H5213 Myopia, bilateral: Secondary | ICD-10-CM | POA: Diagnosis not present

## 2023-05-09 DIAGNOSIS — H02403 Unspecified ptosis of bilateral eyelids: Secondary | ICD-10-CM | POA: Diagnosis not present

## 2023-05-09 DIAGNOSIS — H4720 Unspecified optic atrophy: Secondary | ICD-10-CM | POA: Diagnosis not present

## 2023-05-14 ENCOUNTER — Telehealth: Payer: Self-pay | Admitting: Family Medicine

## 2023-05-14 NOTE — Telephone Encounter (Signed)
Copied from CRM [id number]. Topic: General - Billing Inquiry >> May 14, 2023  4:20 PM Almira Coaster wrote: Reason for CRM: Patient received a letter from [employer] stating they were unable to process claims for urinalysis done on 04/30/2023. Thet asked patient to submit a copy of the front and back of the insurance card; however, she has no way of doing this and she would like to know if we can submit that for her if not she would be responsible for the bill. Please call patient and advise.

## 2023-05-20 NOTE — Assessment & Plan Note (Signed)
 Well controlled, no changes to meds. Encouraged heart healthy diet such as the DASH diet and exercise as tolerated.

## 2023-05-20 NOTE — Assessment & Plan Note (Signed)
 Hydrate and monitor

## 2023-05-20 NOTE — Assessment & Plan Note (Signed)
 Reate controlled, tolerating meds

## 2023-05-20 NOTE — Assessment & Plan Note (Signed)
 hgba1c acceptable, minimize simple carbs. Increase exercise as tolerated.

## 2023-05-20 NOTE — Assessment & Plan Note (Signed)
 Supplement and monitor

## 2023-05-20 NOTE — Assessment & Plan Note (Signed)
 Encouraged to get adequate exercise, calcium and vitamin d intake. Last Dexa 2019

## 2023-05-20 NOTE — Assessment & Plan Note (Signed)
 Encourage heart healthy diet such as MIND or DASH diet, increase exercise, avoid trans fats, simple carbohydrates and processed foods, consider a krill or fish or flaxseed oil cap daily.

## 2023-05-21 ENCOUNTER — Other Ambulatory Visit (HOSPITAL_BASED_OUTPATIENT_CLINIC_OR_DEPARTMENT_OTHER): Payer: Self-pay

## 2023-05-21 ENCOUNTER — Ambulatory Visit (INDEPENDENT_AMBULATORY_CARE_PROVIDER_SITE_OTHER): Payer: Medicare HMO | Admitting: Family Medicine

## 2023-05-21 VITALS — BP 116/75 | HR 67 | Temp 98.3°F | Resp 16 | Ht 65.0 in | Wt 186.0 lb

## 2023-05-21 DIAGNOSIS — M810 Age-related osteoporosis without current pathological fracture: Secondary | ICD-10-CM

## 2023-05-21 DIAGNOSIS — I10 Essential (primary) hypertension: Secondary | ICD-10-CM | POA: Diagnosis not present

## 2023-05-21 DIAGNOSIS — M109 Gout, unspecified: Secondary | ICD-10-CM | POA: Diagnosis not present

## 2023-05-21 DIAGNOSIS — E538 Deficiency of other specified B group vitamins: Secondary | ICD-10-CM

## 2023-05-21 DIAGNOSIS — E785 Hyperlipidemia, unspecified: Secondary | ICD-10-CM

## 2023-05-21 DIAGNOSIS — R739 Hyperglycemia, unspecified: Secondary | ICD-10-CM

## 2023-05-21 DIAGNOSIS — I4891 Unspecified atrial fibrillation: Secondary | ICD-10-CM | POA: Diagnosis not present

## 2023-05-21 MED ORDER — COVID-19 MRNA VAC-TRIS(PFIZER) 30 MCG/0.3ML IM SUSY
0.3000 mL | PREFILLED_SYRINGE | Freq: Once | INTRAMUSCULAR | 0 refills | Status: AC
Start: 2023-05-21 — End: 2023-05-22
  Filled 2023-05-21: qty 0.3, 1d supply, fill #0

## 2023-05-21 NOTE — Patient Instructions (Signed)

## 2023-05-22 ENCOUNTER — Encounter: Payer: Self-pay | Admitting: Family Medicine

## 2023-05-22 LAB — COMPREHENSIVE METABOLIC PANEL
ALT: 11 U/L (ref 0–35)
AST: 15 U/L (ref 0–37)
Albumin: 3.9 g/dL (ref 3.5–5.2)
Alkaline Phosphatase: 64 U/L (ref 39–117)
BUN: 17 mg/dL (ref 6–23)
CO2: 28 meq/L (ref 19–32)
Calcium: 8.8 mg/dL (ref 8.4–10.5)
Chloride: 100 meq/L (ref 96–112)
Creatinine, Ser: 0.61 mg/dL (ref 0.40–1.20)
GFR: 80.03 mL/min (ref >60.00)
Glucose, Bld: 87 mg/dL (ref 70–99)
Potassium: 4.3 meq/L (ref 3.5–5.1)
Sodium: 135 meq/L (ref 135–145)
Total Bilirubin: 0.3 mg/dL (ref 0.2–1.2)
Total Protein: 6.9 g/dL (ref 6.0–8.3)

## 2023-05-22 LAB — HEMOGLOBIN A1C: Hgb A1c MFr Bld: 5.1 % (ref 4.6–6.5)

## 2023-05-22 LAB — CBC WITH DIFFERENTIAL/PLATELET
Basophils Absolute: 0.1 10*3/uL (ref 0.0–0.1)
Basophils Relative: 1.1 % (ref 0.0–3.0)
Eosinophils Absolute: 0.2 10*3/uL (ref 0.0–0.7)
Eosinophils Relative: 2.2 % (ref 0.0–5.0)
HCT: 34.9 % — ABNORMAL LOW (ref 36.0–46.0)
Hemoglobin: 11.4 g/dL — ABNORMAL LOW (ref 12.0–15.0)
Lymphocytes Relative: 30.1 % (ref 12.0–46.0)
Lymphs Abs: 2.2 10*3/uL (ref 0.7–4.0)
MCHC: 32.6 g/dL (ref 30.0–36.0)
MCV: 92.9 fL (ref 78.0–100.0)
Monocytes Absolute: 0.6 10*3/uL (ref 0.1–1.0)
Monocytes Relative: 8.7 % (ref 3.0–12.0)
Neutro Abs: 4.2 10*3/uL (ref 1.4–7.7)
Neutrophils Relative %: 57.9 % (ref 43.0–77.0)
Platelets: 375 10*3/uL (ref 150.0–400.0)
RBC: 3.76 Mil/uL — ABNORMAL LOW (ref 3.87–5.11)
RDW: 14.5 % (ref 11.5–15.5)
WBC: 7.3 10*3/uL (ref 4.0–10.5)

## 2023-05-22 LAB — TSH: TSH: 1.14 u[IU]/mL (ref 0.35–5.50)

## 2023-05-22 LAB — LIPID PANEL
Cholesterol: 161 mg/dL (ref 0–200)
HDL: 50.7 mg/dL (ref >39.00)
LDL Cholesterol: 84 mg/dL (ref 0–99)
NonHDL: 109.92
Total CHOL/HDL Ratio: 3
Triglycerides: 131 mg/dL (ref 0.0–149.0)
VLDL: 26.2 mg/dL (ref 0.0–40.0)

## 2023-05-22 LAB — VITAMIN D 25 HYDROXY (VIT D DEFICIENCY, FRACTURES): VITD: 26.45 ng/mL — ABNORMAL LOW (ref 30.00–100.00)

## 2023-05-22 LAB — VITAMIN B12: Vitamin B-12: 278 pg/mL (ref 211–911)

## 2023-05-22 LAB — MAGNESIUM: Magnesium: 1.7 mg/dL (ref 1.5–2.5)

## 2023-05-22 NOTE — Progress Notes (Signed)
 Subjective:    Patient ID: Alexis Olson, female    DOB: Mar 10, 1934, 89 y.o.   MRN: [id number]  Chief Complaint  Patient presents with   Follow-up    HPI Discussed the use of AI scribe software for clinical note transcription with the patient, who gave verbal consent to proceed.  History of Present Illness Alexis Olson is an 89 year old female who presents with recurrent epistaxis. She is accompanied by her daughter, who is concerned about her balance.  She experiences recurrent epistaxis, with three episodes occurring last year and one episode last night. These episodes are a new issue, having started last year. They are manageable with pressure and ice and do not last long. She is concerned that her medication, Eliquis, might be contributing to the bleeding, as she has been on it since last year.  She is currently taking Eliquis and needs to remain on it. Additionally, she takes Lasix, 20 mg twice a week, and Toprol for heart rate control. No issues with Lasix, such as muscle cramps.  She uses a walking cane for balance, although her daughter is concerned about her dependency on it. She acknowledges that her balance and reaction time are not as good as she used to be, which makes her cautious about falling.    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 difficile diarrhea    Colon perforation (HCC) 08/2014   following polypetomy during colo, surgical repair   COLONIC POLYPS, ADENOMATOUS, HX OF 2010   DIVERTICULOSIS, COLON    DVT (deep venous thrombosis) (HCC) 2018   DVT (deep venous thrombosis) (HCC)    GERD (gastroesophageal reflux disease) 08/23/2015   Graded compression stocking in place    History of blood transfusion    History of migraine    History of rheumatic fever     HSV (herpes simplex virus) infection 08/16/2015   Hyperlipidemia    Hypertension    Denies   Hyponatremia 05/02/2016   Incomplete left bundle branch block (LBBB) 2016   Noted on EKG   INSOMNIA    Lumbosacral disc disease    Chronic pain; History of osteomyelitis 2010 following ESI complication   Obesity    Osteomyelitis (HCC)    Pelvic fracture (HCC)    Pneumonia    Recurrent UTI 08/23/2015   URINARY URGENCY    Ventral hernia without obstruction or gangrene 05/22/2018   VITAMIN B12 DEFICIENCY     Past Surgical History:  Procedure Laterality Date   BACK SURGERY  1994   for ruptured disc   CHOLECYSTECTOMY     COLONOSCOPY     greenfield filter     ileojejunal bypass  1976   for obesity   LAPAROTOMY N/A 09/18/2014   Procedure: EXPLORATORY LAPAROTOMY WITH REPAIR OF CECIAL PERFORATION;  Surgeon: Chevis Pretty III, MD;  Location: [facility];  Service: General;  Laterality: N/A;   TONSILLECTOMY     TOTAL KNEE ARTHROPLASTY Right 08/26/2019   Procedure: RIGHT TOTAL KNEE ARTHROPLASTY, LEFT KNEE CORTISONE INJECTION;  Surgeon: Durene Romans, MD;  Location: [facility];  Service: Orthopedics;  Laterality: Right;  70 MINS   TOTAL KNEE ARTHROPLASTY Left 01/25/2021   Procedure: TOTAL KNEE ARTHROPLASTY;  Surgeon: Durene Romans,  MD;  Location: [facility];  Service: Orthopedics;  Laterality: Left;   UPPER GI ENDOSCOPY     VASCULAR SURGERY Right    revascularization right lower extremity    Family History  Problem Relation Age of Onset   Cervical cancer Mother    Stroke Mother    Liver disease Sister    Kidney disease Sister    Cirrhosis Sister    Colon cancer Neg Hx    Esophageal cancer Neg Hx    Rectal cancer Neg Hx    Stomach cancer Neg Hx     Social History   Socioeconomic History   Marital status: Married    Spouse name: Not on file   Number of children: 3   Years of education: Not on file   Highest education level: Not on file  Occupational History   Occupation: retired Teacher, adult education: RETIRED   Tobacco Use   Smoking status: Never   Smokeless tobacco: Never  Vaping Use   Vaping status: Never Used  Substance and Sexual Activity   Alcohol use: No    Alcohol/week: 0.0 standard drinks of alcohol   Drug use: No   Sexual activity: Never    Partners: Male  Other Topics Concern   Not on file  Social History Narrative   Lives with spouse, Indep ADLs   Supportive family nearby   No dietary restrictions   Social Drivers of Corporate investment banker Strain: Low Risk  (07/28/2021)   Overall Financial Resource Strain (CARDIA)    Difficulty of Paying Living Expenses: Not hard at all  Food Insecurity: No Food Insecurity (12/12/2021)   Hunger Vital Sign    Worried About Running Out of Food in the Last Year: Never true    Ran Out of Food in the Last Year: Never true  Transportation Needs: No Transportation Needs (12/12/2021)   PRAPARE - Administrator, Civil Service (Medical): No    Lack of Transportation (Non-Medical): No  Physical Activity: Insufficiently Active (07/28/2021)   Exercise Vital Sign    Days of Exercise per Week: 3 days    Minutes of Exercise per Session: 30 min  Stress: No Stress Concern Present (12/12/2021)   [employer] of Occupational Health - Occupational Stress Questionnaire    Feeling of Stress : Not at all  Social Connections: Socially Integrated (07/28/2021)   Social Connection and Isolation Panel [NHANES]    Frequency of Communication with Friends and Family: More than three times a week    Frequency of Social Gatherings with Friends and Family: More than three times a week    Attends Religious Services: More than 4 times per year    Active Member of [employer] or Organizations: Yes    Attends Engineer, structural: More than 4 times per year    Marital Status: Married  Catering manager Violence: Not At Risk (07/28/2021)   Humiliation, Afraid, Rape, and Kick questionnaire    Fear of Current or Ex-Partner: No    Emotionally Abused: No     Physically Abused: No    Sexually Abused: No    Outpatient Medications Prior to Visit  Medication Sig Dispense Refill   acetaminophen (TYLENOL) 500 MG tablet Take 2 tablets (1,000 mg total) by mouth every 8 (eight) hours. (Patient taking differently: Take 1,000 mg by mouth every 8 (eight) hours as needed for mild pain (pain score 1-3).) 30 tablet 0   albuterol (PROVENTIL HFA;VENTOLIN HFA) 108 (90 BASE) MCG/ACT  inhaler Inhale 1-2 puffs into the lungs every 6 (six) hours as needed for wheezing or shortness of breath.     ALPRAZolam (XANAX) 0.5 MG tablet Take 1 tablet by mouth at bedtime as needed for anxiety. 30 tablet 3   amitriptyline (ELAVIL) 100 MG tablet Take 1 tablet (100 mg total) by mouth daily. 90 tablet 0   apixaban (ELIQUIS) 5 MG TABS tablet Take 1 tablet (5 mg total) by mouth every 12 (twelve) hours. 180 tablet 3   azelastine (OPTIVAR) 0.05 % ophthalmic solution Place 1 drop into both eyes daily as needed (for dry eyes).     budesonide-formoterol (SYMBICORT) 160-4.5 MCG/ACT inhaler Inhale 2 puffs into the lungs See admin instructions. Inhale 2 puffs into the lungs one to two times a day 1 Inhaler 2   cephALEXin (KEFLEX) 500 MG capsule Take 1 capsule (500 mg total) by mouth 2 (two) times daily. 14 capsule 0   cetirizine (ZYRTEC) 10 MG tablet Take 10 mg by mouth daily.      cyanocobalamin (DODEX) 1000 MCG/ML injection Inject 1 mL (1,000 mcg total) into the muscle every 30 (thirty) days. 10 mL 1   dicyclomine (BENTYL) 20 MG tablet Take 0.5-1 tablets (10-20 mg total) by mouth 3 (three) times daily as needed. 90 tablet 3   diphenoxylate-atropine (LOMOTIL) 2.5-0.025 MG tablet Take 1-2 tablets by mouth up to 4 (four) times daily. 180 tablet 1   docusate sodium (COLACE) 100 MG capsule Take 1 capsule (100 mg total) by mouth 2 (two) times daily. 10 capsule 0   Fe Fum-FA-B Cmp-C-Zn-Mg-Mn-Cu (HEMOCYTE PLUS) 106-1 MG CAPS Take 1 capsule by mouth 2 (two) times daily. 60 capsule 1   fluticasone  (FLONASE) 50 MCG/ACT nasal spray Place 1-2 sprays into both nostrils daily. 16 g 3   gabapentin (NEURONTIN) 300 MG capsule Take 1 capsule (300 mg total) by mouth at bedtime. 90 capsule 1   hydrOXYzine (ATARAX) 25 MG tablet Take 1 tablet (25 mg total) by mouth 3 (three) times daily. 90 tablet 6   levocetirizine (XYZAL) 5 MG tablet Take 1 tablet (5 mg total) by mouth every evening. 90 tablet 3   magnesium oxide (MAG-OX) 400 MG tablet Take 400 mg by mouth 2 (two) times daily.     meclizine (ANTIVERT) 25 MG tablet Take 1 tablet (25 mg total) by mouth 3 (three) times daily as needed for dizziness. 30 tablet 0   methocarbamol (ROBAXIN) 500 MG tablet Take 1 tablet (500 mg total) by mouth 3 (three) times daily as needed. 60 tablet 0   metoprolol succinate (TOPROL XL) 25 MG 24 hr tablet Take 1 tablet (25 mg total) by mouth daily. 90 tablet 0   montelukast (SINGULAIR) 10 MG tablet Take 1 tablet (10 mg total) by mouth daily. 30 tablet 6   omeprazole (PRILOSEC) 20 MG capsule Take 1 capsule (20 mg total) by mouth daily. 90 capsule 3   ondansetron (ZOFRAN-ODT) 4 MG disintegrating tablet Take 1 tablet (4 mg total) by mouth 3 (three) times daily as needed for nausea or vomiting. 20 tablet 0   potassium chloride SA (KLOR-CON M) 20 MEQ tablet Take 1 tablet (20 mEq total) by mouth daily. 90 tablet 1   Probiotic Product (PROBIOTIC DAILY PO) Take 1 capsule by mouth daily.     Spacer/Aero-Holding Chambers (AEROCHAMBER PLUS WITH MASK) inhaler Use as directed 1 each 1   terconazole (TERAZOL 7) 0.4 % vaginal cream Insert 1 applicatorful vaginally at bedtime for 7 nights.  Apply  externally as well nightly. 45 g 0   triamcinolone (KENALOG) 0.025 % ointment Apply 1 application topically 2 (two) times daily. 30 g 0   Vitamin D, Ergocalciferol, (DRISDOL) 1.25 MG (50000 UNIT) CAPS capsule Take 1 capsule (50,000 Units total) by mouth every 7 (seven) days. 8 capsule 0   zolpidem (AMBIEN) 5 MG tablet Take 1 tablet (5 mg total) by  mouth at bedtime. 90 tablet 1   No facility-administered medications prior to visit.    Allergies  Allergen Reactions   Ativan [Lorazepam] Anaphylaxis and Other (See Comments)    Deathly allergic; "resp rate dropped dropped to 6"   Other Anaphylaxis    Red peppers--choking, also   Azithromycin Diarrhea   Benadryl [Diphenhydramine Hcl] Other (See Comments)    Paradoxical response   Ciprofloxacin Hcl Other (See Comments)    Causes yeast infection and refuses to take   Codeine Phosphate Nausea Only    Can tolerate in limited amounts   Hydrocodone Itching and Nausea And Vomiting   Hydrocodone Bit-Homatrop Mbr Itching and Nausea And Vomiting   Levaquin [Levofloxacin In D5w] Other (See Comments)    Muscle soreness   Meperidine Hcl Other (See Comments)    Reaction ??   Molds & Smuts Other (See Comments)    unsure   Sulfa Antibiotics Other (See Comments)    Joint pain   Tape Other (See Comments)    No Coban wrap (per the patient)   Ultram [Tramadol Hcl] Other (See Comments)    "Can't move joints"    Review of Systems  Constitutional:  Positive for malaise/fatigue. Negative for fever.  HENT:  Positive for nosebleeds. Negative for congestion.   Eyes:  Negative for blurred vision.  Respiratory:  Negative for shortness of breath.   Cardiovascular:  Negative for chest pain, palpitations and leg swelling.  Gastrointestinal:  Negative for abdominal pain, blood in stool and nausea.  Genitourinary:  Negative for dysuria and frequency.  Musculoskeletal:  Negative for falls.  Skin:  Negative for rash.  Neurological:  Negative for dizziness, loss of consciousness and headaches.  Endo/Heme/Allergies:  Negative for environmental allergies.  Psychiatric/Behavioral:  Negative for depression. The patient is not nervous/anxious.        Objective:    Physical Exam Constitutional:      General: She is not in acute distress.    Appearance: Normal appearance. She is well-developed. She is  not toxic-appearing.  HENT:     Head: Normocephalic and atraumatic.     Right Ear: External ear normal.     Left Ear: External ear normal.     Nose: Nose normal.  Eyes:     General:        Right eye: No discharge.        Left eye: No discharge.     Conjunctiva/sclera: Conjunctivae normal.  Neck:     Thyroid: No thyromegaly.  Cardiovascular:     Rate and Rhythm: Normal rate and regular rhythm.     Heart sounds: Normal heart sounds. No murmur heard. Pulmonary:     Effort: Pulmonary effort is normal. No respiratory distress.     Breath sounds: Normal breath sounds.  Abdominal:     General: Bowel sounds are normal.     Palpations: Abdomen is soft.     Tenderness: There is no abdominal tenderness. There is no guarding.  Musculoskeletal:        General: Normal range of motion.     Cervical back: Neck supple.  Lymphadenopathy:     Cervical: No cervical adenopathy.  Skin:    General: Skin is warm and dry.  Neurological:     Mental Status: She is alert and oriented to person, place, and time.  Psychiatric:        Mood and Affect: Mood normal.        Behavior: Behavior normal.        Thought Content: Thought content normal.        Judgment: Judgment normal.     BP 116/75 (BP Location: Left Arm, Patient Position: Sitting, Cuff Size: Normal)   Pulse 67   Temp 98.3 F (36.8 C) (Oral)   Resp 16   Ht 5\' 5"  (1.651 m)   Wt 186 lb (84.4 kg)   SpO2 96%   BMI 30.95 kg/m  Wt Readings from Last 3 Encounters:  05/21/23 186 lb (84.4 kg)  02/20/23 186 lb 12.8 oz (84.7 kg)  02/05/23 180 lb 8 oz (81.9 kg)    Diabetic Foot Exam - Simple   No data filed    Lab Results  Component Value Date   WBC 7.3 05/21/2023   HGB 11.4 (L) 05/21/2023   HCT 34.9 (L) 05/21/2023   PLT 375.0 05/21/2023   GLUCOSE 87 05/21/2023   CHOL 161 05/21/2023   TRIG 131.0 05/21/2023   HDL 50.70 05/21/2023   LDLDIRECT 76.0 10/04/2018   LDLCALC 84 05/21/2023   ALT 11 05/21/2023   AST 15 05/21/2023   NA  135 05/21/2023   K 4.3 05/21/2023   CL 100 05/21/2023   CREATININE 0.61 05/21/2023   BUN 17 05/21/2023   CO2 28 05/21/2023   TSH 1.14 05/21/2023   INR 1.1 07/11/2020   HGBA1C 5.1 05/21/2023    Lab Results  Component Value Date   TSH 1.14 05/21/2023   Lab Results  Component Value Date   WBC 7.3 05/21/2023   HGB 11.4 (L) 05/21/2023   HCT 34.9 (L) 05/21/2023   MCV 92.9 05/21/2023   PLT 375.0 05/21/2023   Lab Results  Component Value Date   NA 135 05/21/2023   K 4.3 05/21/2023   CO2 28 05/21/2023   GLUCOSE 87 05/21/2023   BUN 17 05/21/2023   CREATININE 0.61 05/21/2023   BILITOT 0.3 05/21/2023   ALKPHOS 64 05/21/2023   AST 15 05/21/2023   ALT 11 05/21/2023   PROT 6.9 05/21/2023   ALBUMIN 3.9 05/21/2023   CALCIUM 8.8 05/21/2023   ANIONGAP 4 (L) 10/23/2021   EGFR 91 12/03/2020   GFR 80.03 05/21/2023   Lab Results  Component Value Date   CHOL 161 05/21/2023   Lab Results  Component Value Date   HDL 50.70 05/21/2023   Lab Results  Component Value Date   LDLCALC 84 05/21/2023   Lab Results  Component Value Date   TRIG 131.0 05/21/2023   Lab Results  Component Value Date   CHOLHDL 3 05/21/2023   Lab Results  Component Value Date   HGBA1C 5.1 05/21/2023       Assessment & Plan:  Atrial fibrillation, unspecified type (HCC) Assessment & Plan: Reate controlled, tolerating meds   Essential hypertension Assessment & Plan: Well controlled, no changes to meds. Encouraged heart healthy diet such as the DASH diet and exercise as tolerated.   Orders: -     Comprehensive metabolic panel -     CBC with Differential/Platelet -     TSH  Gout, unspecified cause, unspecified chronicity, unspecified site Assessment & Plan: Hydrate and  monitor    Hyperglycemia Assessment & Plan: hgba1c acceptable, minimize simple carbs. Increase exercise as tolerated.  Orders: -     Hemoglobin A1c  Hyperlipidemia, mild Assessment & Plan: Encourage heart healthy diet  such as MIND or DASH diet, increase exercise, avoid trans fats, simple carbohydrates and processed foods, consider a krill or fish or flaxseed oil cap daily.   Orders: -     Lipid panel  Hypomagnesemia Assessment & Plan: Supplement and monitor   Orders: -     Magnesium  Osteoporosis, unspecified osteoporosis type, unspecified pathological fracture presence Assessment & Plan: Encouraged to get adequate exercise, calcium and vitamin d intake. Last Dexa 2019   Orders: -     VITAMIN D 25 Hydroxy (Vit-D Deficiency, Fractures)  Vitamin B 12 deficiency Assessment & Plan: Supplement and monitor   Orders: -     Vitamin B12    Assessment and Plan Assessment & Plan Epistaxis Recent nosebleeds, possibly related to Eliquis use. No significant bleeding events. Discussed the risk/benefit of continuing Eliquis given the risk of stroke with atrial fibrillation. -Continue Eliquis as the benefits outweigh the risks. -Advise patient to apply pressure and ice if nosebleeds occur.  Atrial Fibrillation Irregular rhythm noted on exam, but rate controlled. Patient on Toprol and Eliquis. -Continue current medications. -Continue monitoring.  Fall Risk Patient and spouse both using canes for mobility. Discussed the importance of fall prevention. -Encourage continued use of mobility aids to prevent falls.  General Health Maintenance -Order labs including uric acid and complete metabolic panel. -Order urine test to monitor renal function. -Continue Lasix 20mg  twice weekly, monitor for muscle cramps or other side effects. -Follow-up as needed for medication refills or concerns.     Danise Edge, MD

## 2023-05-23 ENCOUNTER — Other Ambulatory Visit: Payer: Self-pay | Admitting: Family Medicine

## 2023-05-24 ENCOUNTER — Other Ambulatory Visit (HOSPITAL_COMMUNITY): Payer: Self-pay

## 2023-05-24 ENCOUNTER — Other Ambulatory Visit (HOSPITAL_BASED_OUTPATIENT_CLINIC_OR_DEPARTMENT_OTHER): Payer: Self-pay

## 2023-05-24 ENCOUNTER — Other Ambulatory Visit: Payer: Self-pay

## 2023-05-24 MED ORDER — POTASSIUM CHLORIDE CRYS ER 20 MEQ PO TBCR
20.0000 meq | EXTENDED_RELEASE_TABLET | Freq: Every day | ORAL | 1 refills | Status: DC
  Filled 2023-05-24: qty 90, 90d supply, fill #0
  Filled 2023-09-06 – 2023-09-07 (×2): qty 90, 90d supply, fill #1

## 2023-05-29 ENCOUNTER — Other Ambulatory Visit (HOSPITAL_COMMUNITY): Payer: Self-pay

## 2023-05-30 ENCOUNTER — Other Ambulatory Visit: Payer: Self-pay

## 2023-05-30 ENCOUNTER — Other Ambulatory Visit (HOSPITAL_COMMUNITY): Payer: Self-pay

## 2023-05-31 ENCOUNTER — Other Ambulatory Visit (HOSPITAL_COMMUNITY): Payer: Self-pay

## 2023-05-31 ENCOUNTER — Other Ambulatory Visit (HOSPITAL_BASED_OUTPATIENT_CLINIC_OR_DEPARTMENT_OTHER): Payer: Self-pay

## 2023-06-01 ENCOUNTER — Telehealth: Payer: Self-pay

## 2023-06-01 ENCOUNTER — Ambulatory Visit: Payer: Medicare PPO

## 2023-06-01 NOTE — Telephone Encounter (Signed)
 Pt had an appt today with [facility] , no calls from [facility]

## 2023-06-01 NOTE — Telephone Encounter (Signed)
 Copied from CRM [id number]. Topic: General - Other >> Jun 01, 2023 12:53 PM Jon Gills C wrote: Reason for CRM: Patient called in stated she got a missed call from someone , wpuld like for someone to give her a callback regarding this

## 2023-06-02 ENCOUNTER — Other Ambulatory Visit (HOSPITAL_COMMUNITY): Payer: Self-pay

## 2023-06-04 ENCOUNTER — Other Ambulatory Visit: Payer: Self-pay

## 2023-06-04 ENCOUNTER — Other Ambulatory Visit (HOSPITAL_COMMUNITY): Payer: Self-pay

## 2023-06-05 ENCOUNTER — Other Ambulatory Visit (HOSPITAL_COMMUNITY): Payer: Self-pay

## 2023-06-06 NOTE — Telephone Encounter (Signed)
 Left detailed message on machine that I am unable to send for her because we do not have a fax number.  Usually [facility] will send [place] paper stating they need this info but will take some time.  If she is able to get [place] fax number we will fax over to them for her.

## 2023-06-15 ENCOUNTER — Other Ambulatory Visit (HOSPITAL_COMMUNITY): Payer: Self-pay

## 2023-06-15 ENCOUNTER — Other Ambulatory Visit: Payer: Self-pay | Admitting: Internal Medicine

## 2023-06-18 ENCOUNTER — Other Ambulatory Visit (HOSPITAL_COMMUNITY): Payer: Self-pay

## 2023-06-18 ENCOUNTER — Other Ambulatory Visit: Payer: Self-pay | Admitting: Internal Medicine

## 2023-06-18 ENCOUNTER — Other Ambulatory Visit: Payer: Self-pay | Admitting: Family Medicine

## 2023-06-18 DIAGNOSIS — I480 Paroxysmal atrial fibrillation: Secondary | ICD-10-CM

## 2023-06-18 MED ORDER — METOPROLOL SUCCINATE ER 25 MG PO TB24
25.0000 mg | ORAL_TABLET | Freq: Every day | ORAL | 0 refills | Status: DC
  Filled 2023-06-18 – 2023-07-02 (×2): qty 90, 90d supply, fill #0

## 2023-06-18 MED ORDER — VITAMIN D (ERGOCALCIFEROL) 1.25 MG (50000 UNIT) PO CAPS
50000.0000 [IU] | ORAL_CAPSULE | ORAL | 0 refills | Status: AC
  Filled 2023-06-18: qty 8, 56d supply, fill #0

## 2023-06-18 NOTE — Telephone Encounter (Signed)
 Please advise Sir. She called about this and she is aware you are out of the office today.

## 2023-06-18 NOTE — Telephone Encounter (Signed)
 Please advise Alexis Olson, last seen 03/2022.

## 2023-06-18 NOTE — Telephone Encounter (Signed)
error 

## 2023-06-19 ENCOUNTER — Telehealth: Payer: Self-pay | Admitting: Internal Medicine

## 2023-06-19 ENCOUNTER — Other Ambulatory Visit (HOSPITAL_COMMUNITY): Payer: Self-pay

## 2023-06-19 MED ORDER — DIPHENOXYLATE-ATROPINE 2.5-0.025 MG PO TABS
1.0000 | ORAL_TABLET | Freq: Four times a day (QID) | ORAL | 0 refills | Status: DC
  Filled 2023-06-19: qty 180, 23d supply, fill #0

## 2023-06-19 NOTE — Telephone Encounter (Signed)
 Patient called and stated that she is needing a refill for LOMOTIL. Patient stated that she is needing that medication due to her running out. Please advise.

## 2023-06-19 NOTE — Telephone Encounter (Signed)
 Please advise Sir, thank you. I routed the rx to you Sir.

## 2023-06-19 NOTE — Telephone Encounter (Signed)
 I refilled x 1  She must be seen - schedule a f/u visit next available

## 2023-06-20 ENCOUNTER — Other Ambulatory Visit: Payer: Self-pay

## 2023-06-20 ENCOUNTER — Other Ambulatory Visit (HOSPITAL_COMMUNITY): Payer: Self-pay

## 2023-06-20 NOTE — Telephone Encounter (Signed)
 Appointment set up for late June. She wrote it down.

## 2023-06-20 NOTE — Telephone Encounter (Signed)
 Rx refilled and I set her up for a late June appointment.

## 2023-06-22 ENCOUNTER — Other Ambulatory Visit (HOSPITAL_COMMUNITY): Payer: Self-pay

## 2023-06-26 ENCOUNTER — Other Ambulatory Visit: Payer: Self-pay | Admitting: Family Medicine

## 2023-06-26 DIAGNOSIS — F411 Generalized anxiety disorder: Secondary | ICD-10-CM

## 2023-06-26 DIAGNOSIS — G4700 Insomnia, unspecified: Secondary | ICD-10-CM

## 2023-06-27 ENCOUNTER — Other Ambulatory Visit (HOSPITAL_COMMUNITY): Payer: Self-pay

## 2023-06-27 MED ORDER — AMITRIPTYLINE HCL 100 MG PO TABS
100.0000 mg | ORAL_TABLET | Freq: Every day | ORAL | 0 refills | Status: DC
  Filled 2023-06-27: qty 90, 90d supply, fill #0

## 2023-06-29 ENCOUNTER — Other Ambulatory Visit (HOSPITAL_COMMUNITY): Payer: Self-pay

## 2023-06-29 ENCOUNTER — Other Ambulatory Visit: Payer: Self-pay

## 2023-07-02 ENCOUNTER — Other Ambulatory Visit (HOSPITAL_COMMUNITY): Payer: Self-pay

## 2023-07-09 ENCOUNTER — Telehealth: Payer: Self-pay | Admitting: Family Medicine

## 2023-07-09 ENCOUNTER — Ambulatory Visit: Payer: Self-pay

## 2023-07-09 ENCOUNTER — Other Ambulatory Visit: Payer: Self-pay | Admitting: Family Medicine

## 2023-07-09 ENCOUNTER — Other Ambulatory Visit (HOSPITAL_COMMUNITY): Payer: Self-pay

## 2023-07-09 NOTE — Telephone Encounter (Signed)
 Pt called and asked to give a urine specimen at [facility] location dr.blyth usually calls her tests in at. Pt has had symptoms of uti since Friday and says she really needs a test and that Dr.Blyth usually calls one in for her. Pt scheduled to see Trenton Frock tomorrow but she does not want to come in for appt since she usually doesn't have too.

## 2023-07-09 NOTE — Telephone Encounter (Signed)
 Copied from CRM [id number]. Topic: Clinical - Red Word Triage >> Jul 09, 2023 12:22 PM [place] J wrote: [facility] that prompted transfer to Nurse Triage: UTI symptoms worsened. Pain and burning taking place.  Chief Complaint: burning, pain and frequency with urination Symptoms: see above Frequency: started Saturday Pertinent Negatives: Patient denies fever, flank pain, blood in urine Disposition: [] ED /[] Urgent Care (no appt availability in office) / [] Appointment(In office/virtual)/ []  [place] [facility]/ [] Home Care/ [x] Refused Recommended Disposition /[] [place] [facility]/ []  Follow-up with PCP Additional Notes: declined apt; states pcp has her take her urine to the lab and she would like to do so now.  Pcp office to call patient back; care advice given, denies questions; instructed to go to ER if becomes worse.   Reason for Disposition  Age > 50 years  Answer Assessment - Initial Assessment Questions 1. SEVERITY: "How bad is the pain?"  (e.g., Scale 1-10; mild, moderate, or severe)   - MILD (1-3): complains slightly about urination hurting   - MODERATE (4-7): interferes with normal activities     - SEVERE (8-10): excruciating, unwilling or unable to urinate because of the pain      Hurts and burns 2. FREQUENCY: "How many times have you had painful urination today?"      Every time 3. PATTERN: "Is pain present every time you urinate or just sometimes?"      Every time 4. ONSET: "When did the painful urination start?"      Saturday 5. FEVER: "Do you have a fever?" If Yes, ask: "What is your temperature, how was it measured, and when did it start?"     denies 6. PAST UTI: "Have you had a urine infection before?" If Yes, ask: "When was the last time?" and "What happened that time?"      ye 7. CAUSE: "What do you think is causing the painful urination?"  (e.g., UTI, scratch, Herpes sore)     Burning, frequency 8. OTHER SYMPTOMS: "Do you have any other symptoms?" (e.g., blood in  urine, flank pain, genital sores, urgency, vaginal discharge)     denies 9. PREGNANCY: "Is there any chance you are pregnant?" "When was your last menstrual period?"     na  Protocols used: Urination Pain - Female-A-AH

## 2023-07-09 NOTE — Telephone Encounter (Signed)
 FYI Left message on machine for patient to come in to be evaluated.

## 2023-07-10 ENCOUNTER — Encounter: Payer: Self-pay | Admitting: Family Medicine

## 2023-07-10 ENCOUNTER — Other Ambulatory Visit: Payer: Self-pay | Admitting: Emergency Medicine

## 2023-07-10 ENCOUNTER — Other Ambulatory Visit (INDEPENDENT_AMBULATORY_CARE_PROVIDER_SITE_OTHER)

## 2023-07-10 ENCOUNTER — Ambulatory Visit: Admitting: Physician Assistant

## 2023-07-10 DIAGNOSIS — R300 Dysuria: Secondary | ICD-10-CM | POA: Diagnosis not present

## 2023-07-10 LAB — URINALYSIS, ROUTINE W REFLEX MICROSCOPIC
Bilirubin Urine: NEGATIVE
Ketones, ur: NEGATIVE
Nitrite: POSITIVE — AB
Specific Gravity, Urine: 1.01 (ref 1.000–1.030)
Urine Glucose: NEGATIVE
Urobilinogen, UA: 0.2 (ref 0.0–1.0)
pH: 6 (ref 5.0–8.0)

## 2023-07-10 NOTE — Telephone Encounter (Signed)
 Called patient. Instructions given per provider's note. Orders were sent to [facility] per patient's request.

## 2023-07-11 ENCOUNTER — Encounter: Payer: Self-pay | Admitting: *Deleted

## 2023-07-11 ENCOUNTER — Other Ambulatory Visit: Payer: Self-pay | Admitting: Family Medicine

## 2023-07-11 ENCOUNTER — Telehealth: Payer: Self-pay | Admitting: *Deleted

## 2023-07-11 ENCOUNTER — Other Ambulatory Visit (HOSPITAL_COMMUNITY): Payer: Self-pay

## 2023-07-11 DIAGNOSIS — N390 Urinary tract infection, site not specified: Secondary | ICD-10-CM

## 2023-07-11 LAB — HM COLONOSCOPY

## 2023-07-11 MED ORDER — CEPHALEXIN 500 MG PO CAPS
500.0000 mg | ORAL_CAPSULE | Freq: Two times a day (BID) | ORAL | 0 refills | Status: DC
  Filled 2023-07-11: qty 14, 7d supply, fill #0

## 2023-07-11 MED ORDER — CEPHALEXIN 500 MG PO CAPS: 500.0000 mg | ORAL_CAPSULE | Freq: Two times a day (BID) | ORAL | 0 refills | Status: DC

## 2023-07-11 NOTE — Telephone Encounter (Signed)
 Copied from CRM [id number]. Topic: Clinical - Lab/Test Results >> Jul 11, 2023 11:18 AM Luane Rumps D wrote: Reason for CRM: Pt calling for results of urine test but tests have not been reviewed yet. Pt would like a call back when results are ready.

## 2023-07-11 NOTE — Addendum Note (Signed)
 Addended by: Marylou Sobers D on: 07/11/2023 04:52 PM   Modules accepted: Orders

## 2023-07-12 ENCOUNTER — Other Ambulatory Visit (HOSPITAL_COMMUNITY): Payer: Self-pay

## 2023-07-12 LAB — URINE CULTURE
MICRO NUMBER:: 16331807
SPECIMEN QUALITY:: ADEQUATE

## 2023-07-12 NOTE — Telephone Encounter (Signed)
 Dr. Gwenette Lennox sent in Keflex yesterday.  Will await culture to see if change is needed.

## 2023-07-16 ENCOUNTER — Other Ambulatory Visit: Payer: Self-pay | Admitting: Family Medicine

## 2023-07-16 ENCOUNTER — Other Ambulatory Visit (HOSPITAL_COMMUNITY): Payer: Self-pay

## 2023-07-16 DIAGNOSIS — N390 Urinary tract infection, site not specified: Secondary | ICD-10-CM

## 2023-07-16 MED ORDER — CEPHALEXIN 500 MG PO CAPS
500.0000 mg | ORAL_CAPSULE | Freq: Two times a day (BID) | ORAL | 0 refills | Status: DC
  Filled 2023-07-16: qty 10, 5d supply, fill #0

## 2023-07-17 ENCOUNTER — Other Ambulatory Visit (HOSPITAL_COMMUNITY): Payer: Self-pay

## 2023-07-17 ENCOUNTER — Other Ambulatory Visit: Payer: Self-pay

## 2023-07-18 ENCOUNTER — Other Ambulatory Visit (HOSPITAL_COMMUNITY): Payer: Self-pay

## 2023-07-30 ENCOUNTER — Ambulatory Visit: Payer: Medicare PPO | Admitting: Family Medicine

## 2023-07-30 ENCOUNTER — Telehealth: Payer: Self-pay | Admitting: Family Medicine

## 2023-07-30 ENCOUNTER — Other Ambulatory Visit: Payer: Self-pay | Admitting: Family

## 2023-07-30 NOTE — Telephone Encounter (Signed)
 Copied from CRM [id number]. Topic: Medicare AWV >> Jul 30, 2023  3:18 PM Juliana Ocean wrote: Reason for CRM: Called 07/30/2023 to sched AWV - NO VOICEMAIL  Rosalee Collins; Care Guide Ambulatory Clinical Support [place] [facility] Direct Dial: [phone]

## 2023-08-01 ENCOUNTER — Other Ambulatory Visit (HOSPITAL_COMMUNITY): Payer: Self-pay

## 2023-08-01 ENCOUNTER — Other Ambulatory Visit: Payer: Self-pay | Admitting: Family Medicine

## 2023-08-01 MED ORDER — ALPRAZOLAM 0.5 MG PO TABS
0.5000 mg | ORAL_TABLET | Freq: Every evening | ORAL | 3 refills | Status: DC | PRN
  Filled 2023-08-01 – 2023-08-02 (×2): qty 30, 30d supply, fill #0
  Filled 2023-08-29: qty 30, 30d supply, fill #1
  Filled 2023-09-26: qty 30, 30d supply, fill #2
  Filled 2023-11-04: qty 30, 30d supply, fill #3

## 2023-08-01 NOTE — Telephone Encounter (Signed)
 Requesting: alprazolam  0.5mg   Contract: 09/03/20 UDS: 11/11/20 Last Visit: 05/21/23 Next Visit: 11/19/23 Last Refill: 04/02/23 #30 and 0RF  Please Advise

## 2023-08-02 ENCOUNTER — Other Ambulatory Visit: Payer: Self-pay

## 2023-08-02 ENCOUNTER — Other Ambulatory Visit (HOSPITAL_BASED_OUTPATIENT_CLINIC_OR_DEPARTMENT_OTHER): Payer: Self-pay

## 2023-08-02 ENCOUNTER — Other Ambulatory Visit (HOSPITAL_COMMUNITY): Payer: Self-pay

## 2023-08-06 ENCOUNTER — Other Ambulatory Visit (HOSPITAL_COMMUNITY): Payer: Self-pay

## 2023-08-07 ENCOUNTER — Other Ambulatory Visit (HOSPITAL_COMMUNITY): Payer: Self-pay

## 2023-08-07 ENCOUNTER — Other Ambulatory Visit: Payer: Self-pay | Admitting: Internal Medicine

## 2023-08-08 ENCOUNTER — Other Ambulatory Visit (HOSPITAL_COMMUNITY): Payer: Self-pay

## 2023-08-08 ENCOUNTER — Other Ambulatory Visit: Payer: Self-pay

## 2023-08-08 MED ORDER — HEMOCYTE PLUS 106-1 MG PO CAPS
1.0000 | ORAL_CAPSULE | Freq: Two times a day (BID) | ORAL | 1 refills | Status: DC
  Filled 2023-08-08: qty 60, 30d supply, fill #0
  Filled 2023-11-12: qty 60, 30d supply, fill #1

## 2023-08-09 ENCOUNTER — Other Ambulatory Visit: Payer: Self-pay

## 2023-08-10 ENCOUNTER — Other Ambulatory Visit: Payer: Self-pay

## 2023-08-10 ENCOUNTER — Other Ambulatory Visit: Payer: Self-pay | Admitting: Internal Medicine

## 2023-08-10 ENCOUNTER — Other Ambulatory Visit (HOSPITAL_COMMUNITY): Payer: Self-pay

## 2023-08-10 MED ORDER — CYANOCOBALAMIN 1000 MCG/ML IJ SOLN
1000.0000 ug | INTRAMUSCULAR | 1 refills | Status: AC
  Filled 2023-08-10: qty 3, 90d supply, fill #0
  Filled 2024-01-31: qty 3, 90d supply, fill #1

## 2023-08-14 ENCOUNTER — Other Ambulatory Visit: Payer: Self-pay

## 2023-08-14 ENCOUNTER — Other Ambulatory Visit (HOSPITAL_COMMUNITY): Payer: Self-pay

## 2023-08-14 MED ORDER — HYDROXYZINE HCL 25 MG PO TABS
25.0000 mg | ORAL_TABLET | Freq: Three times a day (TID) | ORAL | 6 refills | Status: AC
  Filled 2023-08-14: qty 90, 30d supply, fill #0
  Filled 2023-11-28: qty 90, 30d supply, fill #1

## 2023-08-19 ENCOUNTER — Other Ambulatory Visit: Payer: Self-pay | Admitting: Internal Medicine

## 2023-08-21 ENCOUNTER — Other Ambulatory Visit (HOSPITAL_COMMUNITY): Payer: Self-pay

## 2023-08-21 MED ORDER — DICYCLOMINE HCL 20 MG PO TABS
10.0000 mg | ORAL_TABLET | Freq: Three times a day (TID) | ORAL | 3 refills | Status: AC | PRN
  Filled 2023-08-21 – 2023-08-24 (×2): qty 90, 30d supply, fill #0

## 2023-08-24 ENCOUNTER — Other Ambulatory Visit (HOSPITAL_COMMUNITY): Payer: Self-pay

## 2023-08-24 ENCOUNTER — Other Ambulatory Visit: Payer: Self-pay

## 2023-08-24 ENCOUNTER — Other Ambulatory Visit: Payer: Self-pay | Admitting: Internal Medicine

## 2023-08-24 MED ORDER — DIPHENOXYLATE-ATROPINE 2.5-0.025 MG PO TABS
1.0000 | ORAL_TABLET | Freq: Four times a day (QID) | ORAL | 0 refills | Status: DC
  Filled 2023-08-24: qty 180, 23d supply, fill #0

## 2023-08-24 NOTE — Telephone Encounter (Signed)
 Please advise Sir, thank you.

## 2023-08-25 ENCOUNTER — Other Ambulatory Visit (HOSPITAL_BASED_OUTPATIENT_CLINIC_OR_DEPARTMENT_OTHER): Payer: Self-pay

## 2023-08-27 ENCOUNTER — Other Ambulatory Visit: Payer: Self-pay

## 2023-08-28 ENCOUNTER — Other Ambulatory Visit: Payer: Self-pay

## 2023-08-28 ENCOUNTER — Other Ambulatory Visit (HOSPITAL_COMMUNITY): Payer: Self-pay

## 2023-08-30 ENCOUNTER — Other Ambulatory Visit (HOSPITAL_COMMUNITY): Payer: Self-pay

## 2023-08-30 ENCOUNTER — Other Ambulatory Visit: Payer: Self-pay

## 2023-09-04 ENCOUNTER — Other Ambulatory Visit: Payer: Self-pay | Admitting: Family Medicine

## 2023-09-04 ENCOUNTER — Other Ambulatory Visit: Payer: Self-pay | Admitting: Family

## 2023-09-04 ENCOUNTER — Other Ambulatory Visit (HOSPITAL_COMMUNITY): Payer: Self-pay

## 2023-09-04 MED ORDER — ZOLPIDEM TARTRATE 5 MG PO TABS
5.0000 mg | ORAL_TABLET | Freq: Every day | ORAL | 1 refills | Status: DC
  Filled 2023-09-04: qty 90, 90d supply, fill #0
  Filled 2023-12-01 – 2023-12-03 (×2): qty 90, 90d supply, fill #1

## 2023-09-04 NOTE — Telephone Encounter (Signed)
 Requesting: Ambien  5mg   Contract: 09/03/20 UDS: 11/11/20 Last Visit: 05/21/23 Next Visit: 11/19/23 Last Refill: 03/01/23 #90 and 0RF   Please Advise

## 2023-09-05 ENCOUNTER — Other Ambulatory Visit: Payer: Self-pay

## 2023-09-06 ENCOUNTER — Other Ambulatory Visit (HOSPITAL_COMMUNITY): Payer: Self-pay

## 2023-09-07 ENCOUNTER — Other Ambulatory Visit (HOSPITAL_COMMUNITY): Payer: Self-pay

## 2023-09-07 ENCOUNTER — Other Ambulatory Visit: Payer: Self-pay

## 2023-09-11 ENCOUNTER — Other Ambulatory Visit (HOSPITAL_COMMUNITY): Payer: Self-pay

## 2023-09-11 DIAGNOSIS — L720 Epidermal cyst: Secondary | ICD-10-CM | POA: Diagnosis not present

## 2023-09-17 ENCOUNTER — Telehealth: Payer: Self-pay | Admitting: Internal Medicine

## 2023-09-17 NOTE — Telephone Encounter (Signed)
 Pt would like to bring some forms in office to be filled out by [employer]. Please advise

## 2023-09-17 NOTE — Telephone Encounter (Signed)
 Left a message for the pt to call back.

## 2023-09-19 ENCOUNTER — Ambulatory Visit: Admitting: Internal Medicine

## 2023-09-19 ENCOUNTER — Encounter: Payer: Self-pay | Admitting: Internal Medicine

## 2023-09-19 VITALS — BP 100/60 | HR 88 | Ht 62.25 in | Wt 186.1 lb

## 2023-09-19 DIAGNOSIS — Z9884 Bariatric surgery status: Secondary | ICD-10-CM | POA: Diagnosis not present

## 2023-09-19 DIAGNOSIS — K529 Noninfective gastroenteritis and colitis, unspecified: Secondary | ICD-10-CM

## 2023-09-19 NOTE — Progress Notes (Signed)
 Alexis Olson 89 y.o. 05-Mar-1934 [id number]  Assessment & Plan:   Encounter Diagnoses  Name Primary?   Chronic diarrhea Yes   Hx of bariatric surgery - jejunoileal bypass 1960     This is treated with Lomotil  and occasional Bentyl  for many years.  The patient is elderly but she tolerates these medicines and it gives her quality of life so we will continue those.  She sometimes uses ondansetron  as well.  We will be available to refill these as needed she should return in 1 year routinely.   Subjective:   Chief Complaint: Chronic diarrhea  HPI 89 year old retired Engineer, civil (consulting) with chronic diarrhea here for follow-up, last seen in September 2022. Reported remote history of jejunal ileal bypass in 1976.  She has been maintained on a regimen of generic Lomotil  and dicyclomine . 2 Lomotil  daily at least - sometimes needs 2 more Occ dicyclomine   She continues to enjoy her cats though her favorite cat died within the last year.  Wt Readings from Last 3 Encounters:  09/19/23 186 lb 2 oz (84.4 kg)  05/21/23 186 lb (84.4 kg)  02/20/23 186 lb 12.8 oz (84.7 kg)   CBC in February hemoglobin 11.4 normal MCV platelets and white count.  Consistent with prior hemoglobin levels over the years.  Allergies  Allergen Reactions   Ativan  [Lorazepam ] Anaphylaxis and Other (See Comments)    Deathly allergic; resp rate dropped dropped to 6   Other Anaphylaxis    Red peppers--choking, also   Azithromycin Diarrhea   Benadryl  [Diphenhydramine  Hcl] Other (See Comments)    Paradoxical response   Ciprofloxacin  Hcl Other (See Comments)    Causes yeast infection and refuses to take   Codeine  Phosphate Nausea Only    Can tolerate in limited amounts   Hydrocodone  Itching and Nausea And Vomiting   Hydrocodone  Bit-Homatrop Mbr Itching and Nausea And Vomiting   Levaquin  [Levofloxacin  In D5w] Other (See Comments)    Muscle soreness   Meperidine Hcl Other (See Comments)    Reaction ??   Molds & Smuts Other  (See Comments)    unsure   Sulfa  Antibiotics Other (See Comments)    Joint pain   Tape Other (See Comments)    No Coban wrap (per the patient)   Ultram [Tramadol Hcl] Other (See Comments)    Can't move joints   Current Meds  Medication Sig   acetaminophen  (TYLENOL ) 500 MG tablet Take 2 tablets (1,000 mg total) by mouth every 8 (eight) hours. (Patient taking differently: Take 1,000 mg by mouth every 8 (eight) hours as needed for mild pain (pain score 1-3).)   albuterol  (PROVENTIL  HFA;VENTOLIN  HFA) 108 (90 BASE) MCG/ACT inhaler Inhale 1-2 puffs into the lungs every 6 (six) hours as needed for wheezing or shortness of breath.   ALPRAZolam  (XANAX ) 0.5 MG tablet Take 1 tablet by mouth at bedtime as needed for anxiety.   amitriptyline  (ELAVIL ) 100 MG tablet Take 1 tablet (100 mg total) by mouth daily.   apixaban  (ELIQUIS ) 5 MG TABS tablet Take 1 tablet (5 mg total) by mouth every 12 (twelve) hours.   azelastine (OPTIVAR) 0.05 % ophthalmic solution Place 1 drop into both eyes daily as needed (for dry eyes).   budesonide -formoterol  (SYMBICORT ) 160-4.5 MCG/ACT inhaler Inhale 2 puffs into the lungs See admin instructions. Inhale 2 puffs into the lungs one to two times a day   cetirizine (ZYRTEC) 10 MG tablet Take 10 mg by mouth daily.    cyanocobalamin  (DODEX ) 1000 MCG/ML injection  Inject 1 mL (1,000 mcg total) into the muscle every 30 (thirty) days.   dicyclomine  (BENTYL ) 20 MG tablet Take 0.5-1 tablets (10-20 mg total) by mouth 3 (three) times daily as needed.   diphenoxylate -atropine  (LOMOTIL ) 2.5-0.025 MG tablet Take 1-2 tablets by mouth up to 4 (four) times daily.   docusate sodium  (COLACE) 100 MG capsule Take 1 capsule (100 mg total) by mouth 2 (two) times daily.   Fe Fum-FA-B Cmp-C-Zn-Mg-Mn-Cu (HEMOCYTE PLUS) 106-1 MG CAPS Take 1 capsule by mouth 2 (two) times daily.   fluticasone  (FLONASE ) 50 MCG/ACT nasal spray Place 1-2 sprays into both nostrils daily.   gabapentin  (NEURONTIN ) 300 MG  capsule Take 1 capsule (300 mg total) by mouth at bedtime.   hydrOXYzine  (ATARAX ) 25 MG tablet Take 1 tablet (25 mg total) by mouth 3 (three) times daily.   levocetirizine (XYZAL ) 5 MG tablet Take 1 tablet (5 mg total) by mouth every evening.   magnesium  oxide (MAG-OX) 400 MG tablet Take 400 mg by mouth 2 (two) times daily.   meclizine  (ANTIVERT ) 25 MG tablet Take 1 tablet (25 mg total) by mouth 3 (three) times daily as needed for dizziness.   methocarbamol  (ROBAXIN ) 500 MG tablet Take 1 tablet (500 mg total) by mouth 3 (three) times daily as needed.   metoprolol  succinate (TOPROL  XL) 25 MG 24 hr tablet Take 1 tablet (25 mg total) by mouth daily.   montelukast  (SINGULAIR ) 10 MG tablet Take 1 tablet (10 mg total) by mouth daily in the evening   omeprazole  (PRILOSEC) 20 MG capsule Take 1 capsule (20 mg total) by mouth daily.   ondansetron  (ZOFRAN -ODT) 4 MG disintegrating tablet Take 1 tablet (4 mg total) by mouth 3 (three) times daily as needed for nausea or vomiting.   potassium chloride  SA (KLOR-CON  M) 20 MEQ tablet Take 1 tablet (20 mEq total) by mouth daily.   Probiotic Product (PROBIOTIC DAILY PO) Take 1 capsule by mouth daily.   Spacer/Aero-Holding Chambers (AEROCHAMBER PLUS WITH MASK) inhaler Use as directed   terconazole  (TERAZOL 7 ) 0.4 % vaginal cream Insert 1 applicatorful vaginally at bedtime for 7 nights.  Apply externally as well nightly.   triamcinolone  (KENALOG ) 0.025 % ointment Apply 1 application topically 2 (two) times daily.   Vitamin D , Ergocalciferol , (DRISDOL ) 1.25 MG (50000 UNIT) CAPS capsule Take 1 capsule (50,000 Units total) by mouth every 7 (seven) days.   zolpidem  (AMBIEN ) 5 MG tablet Take 1 tablet (5 mg total) by mouth at bedtime.   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 difficile diarrhea    Colon perforation (HCC) 08/2014   following polypetomy during colo, surgical repair   COLONIC POLYPS, ADENOMATOUS, HX OF 2010   DIVERTICULOSIS, COLON    DVT (deep venous thrombosis) (HCC) 2018   DVT (deep venous thrombosis) (HCC)    GERD (gastroesophageal reflux disease) 08/23/2015   Graded compression stocking in place    History of blood transfusion    History of migraine    History of rheumatic fever    HSV (herpes simplex virus) infection 08/16/2015   Hyperlipidemia    Hypertension    Denies   Hyponatremia 05/02/2016   Incomplete left bundle branch block (LBBB) 2016   Noted on  EKG   INSOMNIA    Lumbosacral disc disease    Chronic pain; History of osteomyelitis 2010 following ESI complication   Obesity    Osteomyelitis (HCC)    Pelvic fracture (HCC)    Pneumonia    Recurrent UTI 08/23/2015   URINARY URGENCY    Ventral hernia without obstruction or gangrene 05/22/2018   VITAMIN B12 DEFICIENCY    Past Surgical History:  Procedure Laterality Date   BACK SURGERY  1994   for ruptured disc   CHOLECYSTECTOMY     COLONOSCOPY     greenfield filter     ileojejunal bypass  1976   for obesity   LAPAROTOMY N/A 09/18/2014   Procedure: EXPLORATORY LAPAROTOMY WITH REPAIR OF CECIAL PERFORATION;  Surgeon: Deward Null III, MD;  Location: [facility];  Service: General;  Laterality: N/A;   TONSILLECTOMY     TOTAL KNEE ARTHROPLASTY Right 08/26/2019   Procedure: RIGHT TOTAL KNEE ARTHROPLASTY, LEFT KNEE CORTISONE INJECTION;  Surgeon: Ernie Cough, MD;  Location: [facility];  Service: Orthopedics;  Laterality: Right;  70 MINS   TOTAL KNEE ARTHROPLASTY Left 01/25/2021   Procedure: TOTAL KNEE ARTHROPLASTY;  Surgeon: Ernie Cough, MD;  Location: [facility];  Service: Orthopedics;  Laterality: Left;   UPPER GI ENDOSCOPY     VASCULAR SURGERY Right    revascularization right lower extremity   Social History   Social History Narrative   Lives with spouse, Indep ADLs   Supportive family  nearby   No dietary restrictions   family history includes Cervical cancer in her mother; Cirrhosis in her sister; Kidney disease in her sister; Liver disease in her sister; Stroke in her mother.   Review of Systems As above  Objective:   Physical Exam @BP  100/60 (BP Location: Left Arm, Patient Position: Sitting, Cuff Size: Large)   Pulse 88   Ht 5' 2.25 (1.581 m) Comment: height measured without shoes  Wt 186 lb 2 oz (84.4 kg)   BMI 33.77 kg/m @  General:  NAD Eyes:   anicteric Lungs:  clear Heart::  S1S2 no rubs, murmurs or gallops Abdomen:  soft and nontender, BS+ Ext:   no edema, cyanosis or clubbing    Data Reviewed:  See above

## 2023-09-19 NOTE — Patient Instructions (Addendum)
 Please have the pharmacy ask for refills when you need them.  Great to see you today.   I appreciate the opportunity to care for you. Lupita CHARLENA Commander, MD, NOLIA

## 2023-09-20 ENCOUNTER — Telehealth: Payer: Self-pay

## 2023-09-20 ENCOUNTER — Other Ambulatory Visit (HOSPITAL_COMMUNITY): Payer: Self-pay

## 2023-09-20 NOTE — Telephone Encounter (Signed)
 Patient dropped off Patient Asst,paperwork 09-19-23 w front desk.  She is very concerned that Lyn Via get this information!  I will put the folder in [place] Mailbox

## 2023-09-20 NOTE — Telephone Encounter (Signed)
**Note De-Identified Madalee Altmann Obfuscation** I no longer work with Patient Assistance for medications. Forwarding this message to Ileana Lehmann, [facility] to assist the pt.

## 2023-09-21 ENCOUNTER — Other Ambulatory Visit: Payer: Self-pay | Admitting: Internal Medicine

## 2023-09-21 ENCOUNTER — Telehealth: Payer: Self-pay | Admitting: Pharmacy Technician

## 2023-09-21 NOTE — Telephone Encounter (Addendum)
 PAP: Application for Eliquis  has been submitted to [employer] ([employer]), via fax   Faxed patient portion, provider portion, out of pocket and insurance. Missing proof of income

## 2023-09-21 NOTE — Telephone Encounter (Signed)
Faxed application to [employer].

## 2023-09-24 ENCOUNTER — Other Ambulatory Visit (HOSPITAL_COMMUNITY): Payer: Self-pay

## 2023-09-24 ENCOUNTER — Other Ambulatory Visit: Payer: Self-pay | Admitting: Family Medicine

## 2023-09-24 ENCOUNTER — Other Ambulatory Visit: Payer: Self-pay

## 2023-09-24 ENCOUNTER — Other Ambulatory Visit: Payer: Self-pay | Admitting: Family

## 2023-09-24 DIAGNOSIS — F411 Generalized anxiety disorder: Secondary | ICD-10-CM

## 2023-09-24 DIAGNOSIS — G4700 Insomnia, unspecified: Secondary | ICD-10-CM

## 2023-09-24 MED ORDER — OMEPRAZOLE 20 MG PO CPDR
20.0000 mg | DELAYED_RELEASE_CAPSULE | Freq: Every day | ORAL | 3 refills | Status: AC
  Filled 2023-09-24: qty 90, 90d supply, fill #0
  Filled 2023-12-14: qty 90, 90d supply, fill #1
  Filled 2024-03-12: qty 90, 90d supply, fill #2
  Filled 2024-03-17 – 2024-03-18 (×2): qty 90, 90d supply, fill #0

## 2023-09-25 ENCOUNTER — Other Ambulatory Visit (HOSPITAL_COMMUNITY): Payer: Self-pay

## 2023-09-25 ENCOUNTER — Other Ambulatory Visit: Payer: Self-pay

## 2023-09-25 MED ORDER — AMITRIPTYLINE HCL 100 MG PO TABS
100.0000 mg | ORAL_TABLET | Freq: Every day | ORAL | 0 refills | Status: DC
Start: 2023-09-25 — End: 2023-12-25
  Filled 2023-09-25: qty 90, 90d supply, fill #0

## 2023-09-25 NOTE — Telephone Encounter (Signed)
 I called [employer] and they said they received everything that we faxed and they are sending to processing.

## 2023-09-26 ENCOUNTER — Other Ambulatory Visit (HOSPITAL_COMMUNITY): Payer: Self-pay

## 2023-09-27 ENCOUNTER — Other Ambulatory Visit (HOSPITAL_COMMUNITY): Payer: Self-pay

## 2023-09-27 NOTE — Telephone Encounter (Signed)
 Per test claims: eliquis  would be 416.69 for 30 days Xarelto  would be 416.69 for 30 days Dabigatran would be 108.30 for 30 days Pradaxa is non-formulary-Try Xarelto  tablet,dabigatran etexilate capsule,warfarin tablet per insurance She does not want to be on warfarin    See husband's encounter for more info:

## 2023-09-29 ENCOUNTER — Other Ambulatory Visit (HOSPITAL_BASED_OUTPATIENT_CLINIC_OR_DEPARTMENT_OTHER): Payer: Self-pay | Admitting: Family

## 2023-09-29 ENCOUNTER — Other Ambulatory Visit (HOSPITAL_BASED_OUTPATIENT_CLINIC_OR_DEPARTMENT_OTHER): Payer: Self-pay | Admitting: Family Medicine

## 2023-09-29 ENCOUNTER — Other Ambulatory Visit (HOSPITAL_COMMUNITY): Payer: Self-pay

## 2023-10-01 ENCOUNTER — Other Ambulatory Visit: Payer: Self-pay | Admitting: Family

## 2023-10-01 ENCOUNTER — Other Ambulatory Visit (HOSPITAL_BASED_OUTPATIENT_CLINIC_OR_DEPARTMENT_OTHER): Payer: Self-pay | Admitting: Family Medicine

## 2023-10-01 ENCOUNTER — Other Ambulatory Visit: Payer: Self-pay

## 2023-10-01 ENCOUNTER — Other Ambulatory Visit (HOSPITAL_COMMUNITY): Payer: Self-pay

## 2023-10-01 ENCOUNTER — Other Ambulatory Visit: Payer: Self-pay | Admitting: Family Medicine

## 2023-10-01 ENCOUNTER — Telehealth: Payer: Self-pay | Admitting: Internal Medicine

## 2023-10-01 DIAGNOSIS — I480 Paroxysmal atrial fibrillation: Secondary | ICD-10-CM

## 2023-10-01 MED ORDER — METOPROLOL SUCCINATE ER 25 MG PO TB24
25.0000 mg | ORAL_TABLET | Freq: Every day | ORAL | 0 refills | Status: DC
  Filled 2023-10-01: qty 90, 90d supply, fill #0

## 2023-10-01 NOTE — Telephone Encounter (Signed)
Patient was calling to speak with you. Please advise

## 2023-10-02 ENCOUNTER — Other Ambulatory Visit: Payer: Self-pay

## 2023-10-02 ENCOUNTER — Telehealth: Payer: Self-pay | Admitting: Internal Medicine

## 2023-10-02 ENCOUNTER — Other Ambulatory Visit (HOSPITAL_COMMUNITY): Payer: Self-pay

## 2023-10-02 DIAGNOSIS — I480 Paroxysmal atrial fibrillation: Secondary | ICD-10-CM

## 2023-10-02 MED ORDER — APIXABAN 5 MG PO TABS
5.0000 mg | ORAL_TABLET | Freq: Two times a day (BID) | ORAL | 1 refills | Status: DC
  Filled 2023-10-02 (×3): qty 180, 90d supply, fill #0
  Filled 2023-12-17 (×2): qty 180, 90d supply, fill #1

## 2023-10-02 NOTE — Telephone Encounter (Signed)
 Prescription refill request for Eliquis  received. Indication: a fib Last office visit: 02/20/23 Scr: 0.61 epic 05/21/23 Age: 89 Weight: 84 kg

## 2023-10-02 NOTE — Telephone Encounter (Signed)
**Note De-Identified Alexis Olson Obfuscation** I called the pt back and she had questions about the denial letter she got from [facility] concerning her Eliquis  application for assistance.  I advised her that I no longer work with pt asst for medications but I did advise her to contact [employer] to get a better understanding of the denial and to also call Stephanie. The pt states that Corean is the [facility] employee that assisted her with her application.  She verbalized understanding and thanked me for calling her back.

## 2023-10-02 NOTE — Telephone Encounter (Signed)
*  STAT* If patient is at the pharmacy, call can be transferred to refill team.   1. Which medications need to be refilled? (please list name of each medication and dose if known) need a new prescription for Eliquis  5g 2 times a day   2. Would you like to learn more about the convenience, safety, & potential cost savings by using the [facility]?     3. Are you open to using the [facility] (Type [facility].  4. Which pharmacy/location (including street and city if local pharmacy) is medication to be sent to?[facility]   5. Do they need a 30 day or 90 day supply? 90 days and refills- please call today- out of medicine

## 2023-10-02 NOTE — Telephone Encounter (Signed)
 Forms are being managed will close out this encounter.

## 2023-10-02 NOTE — Telephone Encounter (Signed)
 Returned patients call and she stated she spoke with her cardiologist. She states that the Eliquis  5 MG was send to pharmacy and she did not need for Dr. Domenica to send it in.

## 2023-10-02 NOTE — Telephone Encounter (Signed)
 Pt states her elliquis was denied and she is completely out. She is worried if she does not get it it will cause her to have a stroke. She is insistent pcp fills this rx. I advised per the previous messages about this, it looks like maybe she was getting this through cardiology? She stated pcp will fill this for her if asked., please advise.    [facility] [address], [place] [address] Phone: [phone]  Fax: [phone]

## 2023-10-02 NOTE — Telephone Encounter (Signed)
 Per [employer] they have her making 32,500 for household size 2 and she has not paid enough for the 3% oop per [employer] so she was denied.   I called the patient and left a message

## 2023-10-03 ENCOUNTER — Other Ambulatory Visit (HOSPITAL_COMMUNITY): Payer: Self-pay

## 2023-10-03 NOTE — Telephone Encounter (Signed)
 Patient to pick up eliquis  today and will come by and drop off oop

## 2023-10-05 NOTE — Telephone Encounter (Signed)
 Faxed [employer] oop

## 2023-10-08 ENCOUNTER — Other Ambulatory Visit (HOSPITAL_COMMUNITY): Payer: Self-pay

## 2023-10-08 ENCOUNTER — Other Ambulatory Visit: Payer: Self-pay | Admitting: Family

## 2023-10-09 ENCOUNTER — Other Ambulatory Visit (HOSPITAL_BASED_OUTPATIENT_CLINIC_OR_DEPARTMENT_OTHER): Payer: Self-pay

## 2023-10-10 ENCOUNTER — Telehealth: Payer: Self-pay

## 2023-10-10 DIAGNOSIS — Z87440 Personal history of urinary (tract) infections: Secondary | ICD-10-CM

## 2023-10-10 DIAGNOSIS — R350 Frequency of micturition: Secondary | ICD-10-CM

## 2023-10-10 NOTE — Telephone Encounter (Signed)
 Lmom for patient to go over options since [employer] denied application   Per test claims: eliquis  would be 416.69 for 30 days Xarelto  would be 416.69 for 30 days Dabigatran would be 108.30 for 30 days Pradaxa is non-formulary-Try Xarelto  tablet,dabigatran etexilate capsule,warfarin tablet per insurance She does not want to be on warfarin

## 2023-10-10 NOTE — Telephone Encounter (Signed)
 Patient was made aware that she will need appointment for the urine and she denied and wanted to  see if Dr. Domenica will just allow her to drop off a urine sample. Please advise.  Copied from CRM [id number]. Topic: Clinical - Request for Lab/Test Order >> Oct 10, 2023  1:52 PM Berneda FALCON wrote: Reason for CRM: Pt thinks she has a UTI-burning with urination. Would like orders for urine labs today if all possible please.  Patient callback is [phone]

## 2023-10-11 ENCOUNTER — Other Ambulatory Visit: Payer: Self-pay | Admitting: Family Medicine

## 2023-10-11 ENCOUNTER — Other Ambulatory Visit (HOSPITAL_COMMUNITY): Payer: Self-pay

## 2023-10-11 ENCOUNTER — Ambulatory Visit: Payer: Self-pay | Admitting: Family Medicine

## 2023-10-11 ENCOUNTER — Other Ambulatory Visit

## 2023-10-11 DIAGNOSIS — Z87440 Personal history of urinary (tract) infections: Secondary | ICD-10-CM | POA: Diagnosis not present

## 2023-10-11 DIAGNOSIS — R350 Frequency of micturition: Secondary | ICD-10-CM | POA: Diagnosis not present

## 2023-10-11 LAB — URINALYSIS, ROUTINE W REFLEX MICROSCOPIC
Bilirubin Urine: NEGATIVE
Ketones, ur: NEGATIVE
Nitrite: POSITIVE — AB
Specific Gravity, Urine: <=1.005 — AB (ref 1.000–1.030)
Total Protein, Urine: 100 — AB
Urine Glucose: NEGATIVE
Urobilinogen, UA: 0.2 (ref 0.0–1.0)
pH: 6 (ref 5.0–8.0)

## 2023-10-11 MED ORDER — CEFDINIR 300 MG PO CAPS
300.0000 mg | ORAL_CAPSULE | Freq: Two times a day (BID) | ORAL | 0 refills | Status: DC
  Filled 2023-10-11 – 2023-10-12 (×2): qty 10, 5d supply, fill #0

## 2023-10-11 NOTE — Telephone Encounter (Signed)
 Patient was advised per Dr. Domenica she can have the urine sample completed. Also orders was placed. Patient denied any fever, chills or other symptoms.   Copied from CRM [id number]. Topic: General - Call Back - No Documentation >> Oct 10, 2023  4:40 PM Leah C wrote: Reason for CRM: Patient called in again to see if she can just drop off a urine sample. She stated that Dr. Domenica is familiar with her dropping off urine samples and that the [facility] location is closer for her. I did relay again the message per [facility] about scheduling an appointment but patient stated that she is not willing to do that because she is familiar with what she is dealing with.   Patients contact is [phone].

## 2023-10-11 NOTE — Addendum Note (Signed)
 Addended by: JENEL SMALLER D on: 10/11/2023 02:52 PM   Modules accepted: Orders

## 2023-10-12 ENCOUNTER — Other Ambulatory Visit (HOSPITAL_COMMUNITY): Payer: Self-pay

## 2023-10-12 ENCOUNTER — Encounter (HOSPITAL_COMMUNITY): Payer: Self-pay | Admitting: Pharmacist

## 2023-10-12 NOTE — Telephone Encounter (Signed)
 Copied from CRM [id number]. Topic: General - Call Back - No Documentation >> Oct 11, 2023  3:45 PM Alexis Olson wrote: Reason for CRM: Patient called in stating she just missed a call from the office, agent could not locate who might have called this patient. Callback number [phone].

## 2023-10-14 LAB — URINE CULTURE
MICRO NUMBER:: 16712130
SPECIMEN QUALITY:: ADEQUATE

## 2023-10-15 ENCOUNTER — Other Ambulatory Visit (HOSPITAL_COMMUNITY): Payer: Self-pay

## 2023-10-16 ENCOUNTER — Other Ambulatory Visit: Payer: Self-pay | Admitting: Family Medicine

## 2023-10-16 ENCOUNTER — Ambulatory Visit: Payer: Self-pay

## 2023-10-16 MED ORDER — CEFDINIR 300 MG PO CAPS
300.0000 mg | ORAL_CAPSULE | Freq: Two times a day (BID) | ORAL | 0 refills | Status: DC
  Filled 2023-10-16: qty 10, 5d supply, fill #0

## 2023-10-16 NOTE — Telephone Encounter (Signed)
 FYI Only or Action Required?: Action required by provider: medication refill request.  Patient was last seen in primary care on 05/21/2023 by Domenica Harlene LABOR, MD.  Called Nurse Triage reporting Dysuria.  Symptoms began several days ago.  Interventions attempted: Prescription medications: Cefdinir .  Symptoms are: mild improvement.  Triage Disposition: See Physician Within 24 Hours  Patient/caregiver understands and will follow disposition?: No, wishes to speak with PCP    Copied from CRM #[id number]. Topic: Clinical - Red Word Triage >> Oct 16, 2023  3:27 PM Alexis Olson wrote: Reason for CRM: patient called stating she still having pain when she has to urinate and frequency. patient states when she goes to the bathroom it is better. [facility] because she only has one left   Reason for Disposition  [1] Taking antibiotic > 72 hours (3 days) for UTI AND [2] painful urination or frequency is SAME (unchanged, not better)  Answer Assessment - Initial Assessment Questions Patient is currently out of town at [facility] and would like a refill for the Cefdinir  sent there if possible. Please advise.   [facility] [id number] [address] Alexis [place], [place] [address]     1. MAIN SYMPTOM: What is the main symptom you are concerned about? (e.g., painful urination, urine frequency)     Pain when needing to urinate  2. BETTER-SAME-WORSE: Are you getting better, staying the same, or getting worse compared to how you felt at your last visit to the doctor (most recent medical visit)?     It's a little bit better 3. PAIN: How bad is the pain?  (e.g., Scale 1-10; mild, moderate, or severe)     It just hurts 4. FEVER: Do you have a fever? If Yes, ask: What is it, how was it measured, and when did it start?     No 5. OTHER SYMPTOMS: Do you have any other symptoms? (e.g., blood in the urine, flank pain, vaginal discharge)      Frequent urination  6. DIAGNOSIS: When was the UTI diagnosed? By whom? Was it a kidney infection, bladder infection or both?     10/11/23 7. ANTIBIOTIC: What antibiotic(s) are you taking? How many times per day?     Cefdinir  8. ANTIBIOTIC - START DATE: When did you start taking the antibiotic?     10/11/23  Protocols used: Urinary Tract Infection on Antibiotic Follow-up Call - [facility]

## 2023-10-17 ENCOUNTER — Other Ambulatory Visit: Payer: Self-pay

## 2023-10-17 ENCOUNTER — Other Ambulatory Visit (HOSPITAL_COMMUNITY): Payer: Self-pay

## 2023-10-22 ENCOUNTER — Other Ambulatory Visit (HOSPITAL_COMMUNITY): Payer: Self-pay

## 2023-10-22 ENCOUNTER — Telehealth: Payer: Self-pay

## 2023-10-22 MED ORDER — CEFDINIR 300 MG PO CAPS: 300.0000 mg | ORAL_CAPSULE | Freq: Two times a day (BID) | ORAL | 0 refills | Status: AC

## 2023-10-22 NOTE — Telephone Encounter (Signed)
 [facility]

## 2023-10-22 NOTE — Telephone Encounter (Signed)
 Copied from CRM #[id number]. Topic: Clinical - Prescription Issue >> Oct 22, 2023 12:52 PM Macario HERO wrote: Reason for CRM: Patient called regarding her medication: cefdinir  (OMNICEF ) 300 MG capsule. It was sent to the wrong pharmacy and [facility]   [id number] [place],  - [address] [facility] [address] [place] [address] Phone: [phone] Fax: [phone]

## 2023-11-05 ENCOUNTER — Other Ambulatory Visit (HOSPITAL_COMMUNITY): Payer: Self-pay

## 2023-11-05 ENCOUNTER — Other Ambulatory Visit: Payer: Self-pay

## 2023-11-07 ENCOUNTER — Other Ambulatory Visit (HOSPITAL_COMMUNITY): Payer: Self-pay

## 2023-11-12 ENCOUNTER — Other Ambulatory Visit (HOSPITAL_BASED_OUTPATIENT_CLINIC_OR_DEPARTMENT_OTHER): Payer: Self-pay

## 2023-11-13 ENCOUNTER — Other Ambulatory Visit (HOSPITAL_COMMUNITY): Payer: Self-pay

## 2023-11-13 ENCOUNTER — Other Ambulatory Visit: Payer: Self-pay

## 2023-11-14 ENCOUNTER — Telehealth: Payer: Self-pay | Admitting: Pharmacy Technician

## 2023-11-14 ENCOUNTER — Other Ambulatory Visit (HOSPITAL_COMMUNITY): Payer: Self-pay

## 2023-11-14 NOTE — Telephone Encounter (Signed)
 Patient to bring her oop in today to [facility] and I will send again to [employer] for re-consideration of her application for eliquis 

## 2023-11-14 NOTE — Telephone Encounter (Signed)
 Faxed [employer] new oop   Per test claims: eliquis  would be 416.69 for 30 days Xarelto  would be 416.69 for 30 days Dabigatran would be 108.30 for 30 days Pradaxa is non-formulary-Try Xarelto  tablet,dabigatran etexilate capsule,warfarin tablet per insurance She does not want to be on warfarin

## 2023-11-14 NOTE — Telephone Encounter (Signed)
 Patient came by. Got oop for her and her husband. Sent to onbase. Waiting on onbase to send to plan for her and lynwood ray Sing

## 2023-11-18 ENCOUNTER — Other Ambulatory Visit (HOSPITAL_COMMUNITY): Payer: Self-pay

## 2023-11-18 NOTE — Assessment & Plan Note (Signed)
 Encourage heart healthy diet such as MIND or DASH diet, increase exercise, avoid trans fats, simple carbohydrates and processed foods, consider a krill or fish or flaxseed oil cap daily.

## 2023-11-18 NOTE — Assessment & Plan Note (Signed)
 Hydrate and monitor

## 2023-11-18 NOTE — Assessment & Plan Note (Signed)
 hgba1c acceptable, minimize simple carbs. Increase exercise as tolerated.

## 2023-11-18 NOTE — Assessment & Plan Note (Signed)
 Reate controlled, tolerating meds

## 2023-11-18 NOTE — Assessment & Plan Note (Signed)
 Patient encouraged to maintain heart healthy diet, regular exercise, adequate sleep. Consider daily probiotics. Take medications as prescribed. Labs ordered and reviewed.   Has aged out of MGM, pap and colonoscopy Dexa 2019, consider repeat, ordered today

## 2023-11-18 NOTE — Assessment & Plan Note (Signed)
 Well controlled, no changes to meds. Encouraged heart healthy diet such as the DASH diet and exercise as tolerated.

## 2023-11-18 NOTE — Assessment & Plan Note (Signed)
 Supplement and monitor

## 2023-11-18 NOTE — Assessment & Plan Note (Signed)
 Encouraged to get adequate exercise, calcium and vitamin d intake

## 2023-11-19 ENCOUNTER — Ambulatory Visit (INDEPENDENT_AMBULATORY_CARE_PROVIDER_SITE_OTHER): Payer: Medicare PPO | Admitting: Family Medicine

## 2023-11-19 ENCOUNTER — Other Ambulatory Visit (HOSPITAL_COMMUNITY): Payer: Self-pay

## 2023-11-19 ENCOUNTER — Other Ambulatory Visit: Payer: Self-pay

## 2023-11-19 DIAGNOSIS — E538 Deficiency of other specified B group vitamins: Secondary | ICD-10-CM

## 2023-11-19 DIAGNOSIS — R739 Hyperglycemia, unspecified: Secondary | ICD-10-CM

## 2023-11-19 DIAGNOSIS — I10 Essential (primary) hypertension: Secondary | ICD-10-CM

## 2023-11-19 DIAGNOSIS — Z0000 Encounter for general adult medical examination without abnormal findings: Secondary | ICD-10-CM

## 2023-11-19 DIAGNOSIS — E785 Hyperlipidemia, unspecified: Secondary | ICD-10-CM

## 2023-11-19 DIAGNOSIS — M810 Age-related osteoporosis without current pathological fracture: Secondary | ICD-10-CM

## 2023-11-19 DIAGNOSIS — I4891 Unspecified atrial fibrillation: Secondary | ICD-10-CM

## 2023-11-19 DIAGNOSIS — M109 Gout, unspecified: Secondary | ICD-10-CM

## 2023-11-19 MED ORDER — MONTELUKAST SODIUM 10 MG PO TABS
10.0000 mg | ORAL_TABLET | Freq: Every evening | ORAL | 5 refills | Status: AC
  Filled 2023-11-19: qty 30, 30d supply, fill #0
  Filled 2023-12-22: qty 30, 30d supply, fill #1
  Filled 2024-01-19: qty 30, 30d supply, fill #2
  Filled 2024-02-19: qty 30, 30d supply, fill #3
  Filled 2024-03-19 – 2024-03-21 (×3): qty 30, 30d supply, fill #4
  Filled 2024-04-20: qty 30, 30d supply, fill #5

## 2023-11-19 NOTE — Telephone Encounter (Signed)
 [id number] I called [employer] and they said she was again denied due to not meeting the 3%. They said she should be eligible in November. She has paid 900 of the 1200 she has to pay to be eligible.   Eliquis  lf 10/02/23 too soon until 9/14 to see what new copay would be  Lmom for pt 11/19/23

## 2023-11-20 ENCOUNTER — Other Ambulatory Visit: Payer: Self-pay | Admitting: Family Medicine

## 2023-11-20 ENCOUNTER — Other Ambulatory Visit (HOSPITAL_COMMUNITY): Payer: Self-pay

## 2023-11-20 NOTE — Progress Notes (Signed)
 Patient not seen.

## 2023-11-21 DIAGNOSIS — Z008 Encounter for other general examination: Secondary | ICD-10-CM | POA: Insufficient documentation

## 2023-11-21 NOTE — Progress Notes (Unsigned)
 Subjective:     Patient ID: Alexis Olson, female    DOB: 01-Dec-1933, 89 y.o.   MRN: [id number]  No chief complaint on file.   HPI  [name] a 89 y.o.  female presents today for a complete physical exam. Pt reports consuming a {diet types:17450} diet. {types:19826} Pt generally feels {DESC; WELL/FAIRLY WELL/POORLY:18703}. Reports sleeping {DESC; WELL/FAIRLY WELL/POORLY:18703}.  {does/does not:200015} have additional problems to discuss today.   Follows with GI, dermatology, cardiology  New Surgeries or Family Hx: ***Yes/ Denies Habits: ETOH, illicit drugs, smoking, secondhand exposure, caffeine   Anxiety and depression Alprazolam   0.5 mg as needed bedtime,  Amitriptyline  100 mg daily  Insomnia-Ambien  5 mg at bedtime  HTN- Metoprolol  25 mg daily  A-fib-follows with cardiology Eliquis  5 mg twice daily  COPD Symbicort  2 puffs daily  GERD- omeprazole  20 mg daily  Allergies-Zyrtec 10 mg daily, azelastine ophthalmic solution, fluticasone  nasal spray, Singulair  10 mg daily  Chronic diarrhea-followed by GI- [facility]- Dr. Lupita Commander Lomotil  and occasional Bentyl  for many years.   Peripheral neuropathy-Penton 300 mg at bedtime  History of Present Illness              Health Maintenance Due  Topic Date Due  . Zoster Vaccines- Shingrix (1 of 2) 04/30/1985  . Medicare Annual Wellness (AWV)  07/29/2022  . INFLUENZA VACCINE  10/26/2023    Past Medical History:  Diagnosis Date  . ALLERGIC RHINITIS   . ANEMIA, CHRONIC    Malabsorption related to bypass hx, B12 and iron  deficiency  . ANXIETY   . Arthritis   . ASTHMA   . Bariatric surgery status   . Bowel perforation (HCC) 2016  . C. difficile diarrhea   . Colon perforation (HCC) 08/2014   following polypetomy during colo, surgical repair  . COLONIC POLYPS, ADENOMATOUS, HX OF 2010  . DIVERTICULOSIS, COLON   . DVT (deep venous thrombosis) (HCC) 2018  . DVT (deep venous thrombosis) (HCC)   . GERD  (gastroesophageal reflux disease) 08/23/2015  . Graded compression stocking in place   . History of blood transfusion   . History of migraine   . History of rheumatic fever   . HSV (herpes simplex virus) infection 08/16/2015  . Hyperlipidemia   . Hypertension    Denies  . Hyponatremia 05/02/2016  . Incomplete left bundle branch block (LBBB) 2016   Noted on EKG  . INSOMNIA   . Lumbosacral disc disease    Chronic pain; History of osteomyelitis 2010 following ESI complication  . Obesity   . Osteomyelitis (HCC)   . Pelvic fracture (HCC)   . Pneumonia   . Recurrent UTI 08/23/2015  . URINARY URGENCY   . Ventral hernia without obstruction or gangrene 05/22/2018  . VITAMIN B12 DEFICIENCY     Past Surgical History:  Procedure Laterality Date  . BACK SURGERY  1994   for ruptured disc  . CHOLECYSTECTOMY    . COLONOSCOPY    . greenfield filter    . ileojejunal bypass  1976   for obesity  . LAPAROTOMY N/A 09/18/2014   Procedure: EXPLORATORY LAPAROTOMY WITH REPAIR OF CECIAL PERFORATION;  Surgeon: Deward Null III, MD;  Location: [facility] OR;  Service: General;  Laterality: N/A;  . TONSILLECTOMY    . TOTAL KNEE ARTHROPLASTY Right 08/26/2019   Procedure: RIGHT TOTAL KNEE ARTHROPLASTY, LEFT KNEE CORTISONE INJECTION;  Surgeon: Ernie Cough, MD;  Location: [facility];  Service: Orthopedics;  Laterality: Right;  70 MINS  . TOTAL  KNEE ARTHROPLASTY Left 01/25/2021   Procedure: TOTAL KNEE ARTHROPLASTY;  Surgeon: Ernie Cough, MD;  Location: [facility];  Service: Orthopedics;  Laterality: Left;  . UPPER GI ENDOSCOPY    . VASCULAR SURGERY Right    revascularization right lower extremity    Family History  Problem Relation Age of Onset  . Cervical cancer Mother   . Stroke Mother   . Liver disease Sister   . Kidney disease Sister   . Cirrhosis Sister   . Colon cancer Neg Hx   . Esophageal cancer Neg Hx   . Rectal cancer Neg Hx   . Stomach cancer Neg Hx     Social History   Socioeconomic History  . Marital  status: Married    Spouse name: Not on file  . Number of children: 3  . Years of education: Not on file  . Highest education level: Not on file  Occupational History  . Occupation: retired Teacher, adult education: RETIRED  Tobacco Use  . Smoking status: Never  . Smokeless tobacco: Never  Vaping Use  . Vaping status: Never Used  Substance and Sexual Activity  . Alcohol use: No    Alcohol/week: 0.0 standard drinks of alcohol  . Drug use: No  . Sexual activity: Never    Partners: Male  Other Topics Concern  . Not on file  Social History Narrative   Lives with spouse, Indep ADLs   Supportive family nearby   No dietary restrictions   Social Drivers of Health   Financial Resource Strain: Low Risk  (07/28/2021)   Overall Financial Resource Strain (CARDIA)   . Difficulty of Paying Living Expenses: Not hard at all  Food Insecurity: No Food Insecurity (12/12/2021)   Hunger Vital Sign   . Worried About Programme researcher, broadcasting/film/video in the Last Year: Never true   . Ran Out of Food in the Last Year: Never true  Transportation Needs: No Transportation Needs (12/12/2021)   PRAPARE - Transportation   . Lack of Transportation (Medical): No   . Lack of Transportation (Non-Medical): No  Physical Activity: Insufficiently Active (07/28/2021)   Exercise Vital Sign   . Days of Exercise per Week: 3 days   . Minutes of Exercise per Session: 30 min  Stress: No Stress Concern Present (12/12/2021)   [employer] of Occupational Health - Occupational Stress Questionnaire   . Feeling of Stress : Not at all  Social Connections: Socially Integrated (07/28/2021)   Social Connection and Isolation Panel   . Frequency of Communication with Friends and Family: More than three times a week   . Frequency of Social Gatherings with Friends and Family: More than three times a week   . Attends Religious Services: More than 4 times per year   . Active Member of Clubs or Organizations: Yes   . Attends Banker Meetings:  More than 4 times per year   . Marital Status: Married  Catering manager Violence: Not At Risk (07/28/2021)   Humiliation, Afraid, Rape, and Kick questionnaire   . Fear of Current or Ex-Partner: No   . Emotionally Abused: No   . Physically Abused: No   . Sexually Abused: No    Outpatient Medications Prior to Visit  Medication Sig Dispense Refill  . acetaminophen  (TYLENOL ) 500 MG tablet Take 2 tablets (1,000 mg total) by mouth every 8 (eight) hours. (Patient taking differently: Take 1,000 mg by mouth every 8 (eight) hours as needed for mild pain (pain score 1-3).)  30 tablet 0  . albuterol  (PROVENTIL  HFA;VENTOLIN  HFA) 108 (90 BASE) MCG/ACT inhaler Inhale 1-2 puffs into the lungs every 6 (six) hours as needed for wheezing or shortness of breath.    . ALPRAZolam  (XANAX ) 0.5 MG tablet Take 1 tablet by mouth at bedtime as needed for anxiety. 30 tablet 3  . amitriptyline  (ELAVIL ) 100 MG tablet Take 1 tablet (100 mg total) by mouth daily. 90 tablet 0  . apixaban  (ELIQUIS ) 5 MG TABS tablet Take 1 tablet (5 mg total) by mouth every 12 (twelve) hours. 180 tablet 1  . azelastine (OPTIVAR) 0.05 % ophthalmic solution Place 1 drop into both eyes daily as needed (for dry eyes).    . budesonide -formoterol  (SYMBICORT ) 160-4.5 MCG/ACT inhaler Inhale 2 puffs into the lungs See admin instructions. Inhale 2 puffs into the lungs one to two times a day 1 Inhaler 2  . cetirizine (ZYRTEC) 10 MG tablet Take 10 mg by mouth daily.     . cyanocobalamin  (DODEX ) 1000 MCG/ML injection Inject 1 mL (1,000 mcg total) into the muscle every 30 (thirty) days. 10 mL 1  . dicyclomine  (BENTYL ) 20 MG tablet Take 0.5-1 tablets (10-20 mg total) by mouth 3 (three) times daily as needed. 90 tablet 3  . diphenoxylate -atropine  (LOMOTIL ) 2.5-0.025 MG tablet Take 1-2 tablets by mouth up to 4 (four) times daily. 180 tablet 0  . docusate sodium  (COLACE) 100 MG capsule Take 1 capsule (100 mg total) by mouth 2 (two) times daily. 10 capsule 0  . Fe  Fum-FA-B Cmp-C-Zn-Mg-Mn-Cu (HEMOCYTE PLUS) 106-1 MG CAPS Take 1 capsule by mouth 2 (two) times daily. 60 capsule 1  . fluticasone  (FLONASE ) 50 MCG/ACT nasal spray Place 1-2 sprays into both nostrils daily. 16 g 3  . gabapentin  (NEURONTIN ) 300 MG capsule Take 1 capsule (300 mg total) by mouth at bedtime. 90 capsule 1  . hydrOXYzine  (ATARAX ) 25 MG tablet Take 1 tablet (25 mg total) by mouth 3 (three) times daily. 90 tablet 6  . levocetirizine (XYZAL ) 5 MG tablet Take 1 tablet (5 mg total) by mouth every evening. 90 tablet 3  . magnesium  oxide (MAG-OX) 400 MG tablet Take 400 mg by mouth 2 (two) times daily.    . meclizine  (ANTIVERT ) 25 MG tablet Take 1 tablet (25 mg total) by mouth 3 (three) times daily as needed for dizziness. 30 tablet 0  . methocarbamol  (ROBAXIN ) 500 MG tablet Take 1 tablet (500 mg total) by mouth 3 (three) times daily as needed. 60 tablet 0  . metoprolol  succinate (TOPROL  XL) 25 MG 24 hr tablet Take 1 tablet (25 mg total) by mouth daily. 90 tablet 0  . montelukast  (SINGULAIR ) 10 MG tablet Take 1 tablet (10 mg total) by mouth daily in the evening. 30 tablet 5  . omeprazole  (PRILOSEC) 20 MG capsule Take 1 capsule (20 mg total) by mouth daily. 90 capsule 3  . ondansetron  (ZOFRAN -ODT) 4 MG disintegrating tablet Take 1 tablet (4 mg total) by mouth 3 (three) times daily as needed for nausea or vomiting. 20 tablet 0  . potassium chloride  SA (KLOR-CON  M) 20 MEQ tablet Take 1 tablet (20 mEq total) by mouth daily. 90 tablet 1  . Probiotic Product (PROBIOTIC DAILY PO) Take 1 capsule by mouth daily.    SABRA Spacer/Aero-Holding Chambers (AEROCHAMBER PLUS WITH MASK) inhaler Use as directed 1 each 1  . terconazole  (TERAZOL 7 ) 0.4 % vaginal cream Insert 1 applicatorful vaginally at bedtime for 7 nights.  Apply externally as well nightly. 45 g  0  . triamcinolone  (KENALOG ) 0.025 % ointment Apply 1 application topically 2 (two) times daily. 30 g 0  . Vitamin D , Ergocalciferol , (DRISDOL ) 1.25 MG (50000  UNIT) CAPS capsule Take 1 capsule (50,000 Units total) by mouth every 7 (seven) days. 8 capsule 0  . zolpidem  (AMBIEN ) 5 MG tablet Take 1 tablet (5 mg total) by mouth at bedtime. 90 tablet 1   No facility-administered medications prior to visit.    Allergies  Allergen Reactions  . Ativan  [Lorazepam ] Anaphylaxis and Other (See Comments)    Deathly allergic; resp rate dropped dropped to 6  . Other Anaphylaxis    Red peppers--choking, also  . Azithromycin Diarrhea  . Benadryl  [Diphenhydramine  Hcl] Other (See Comments)    Paradoxical response  . Ciprofloxacin  Hcl Other (See Comments)    Causes yeast infection and refuses to take  . Codeine  Phosphate Nausea Only    Can tolerate in limited amounts  . Hydrocodone  Itching and Nausea And Vomiting  . Hydrocodone  Bit-Homatrop Mbr Itching and Nausea And Vomiting  . Levaquin  [Levofloxacin  In D5w] Other (See Comments)    Muscle soreness  . Meperidine Hcl Other (See Comments)    Reaction ??  . Molds & Smuts Other (See Comments)    unsure  . Sulfa  Antibiotics Other (See Comments)    Joint pain  . Tape Other (See Comments)    No Coban wrap (per the patient)  . Ultram [Tramadol Hcl] Other (See Comments)    Can't move joints    ROS     Objective:    Physical Exam   There were no vitals taken for this visit. Wt Readings from Last 3 Encounters:  09/19/23 186 lb 2 oz (84.4 kg)  05/21/23 186 lb (84.4 kg)  02/20/23 186 lb 12.8 oz (84.7 kg)       Assessment & Plan:   Problem List Items Addressed This Visit   None   I am having Alexis RAMAN. Crabbe maintain her magnesium  oxide, azelastine, cetirizine, albuterol , budesonide -formoterol , Probiotic Product (PROBIOTIC DAILY PO), ondansetron , acetaminophen , docusate sodium , triamcinolone , methocarbamol , Aerochamber Plus, terconazole , fluticasone , meclizine , gabapentin , levocetirizine, potassium chloride  SA, Vitamin D  (Ergocalciferol ), ALPRAZolam , Hemocyte Plus, cyanocobalamin , hydrOXYzine ,  dicyclomine , diphenoxylate -atropine , zolpidem , omeprazole , amitriptyline , metoprolol  succinate, apixaban , and montelukast .  No orders of the defined types were placed in this encounter.

## 2023-11-22 ENCOUNTER — Encounter: Payer: Medicare PPO | Admitting: Family Medicine

## 2023-11-23 ENCOUNTER — Encounter: Payer: Self-pay | Admitting: Student

## 2023-11-23 ENCOUNTER — Other Ambulatory Visit (HOSPITAL_COMMUNITY): Payer: Self-pay

## 2023-11-23 ENCOUNTER — Ambulatory Visit (INDEPENDENT_AMBULATORY_CARE_PROVIDER_SITE_OTHER): Admitting: Student

## 2023-11-23 VITALS — BP 116/70 | HR 77 | Temp 98.7°F | Resp 12 | Ht 62.25 in | Wt 191.6 lb

## 2023-11-23 DIAGNOSIS — Z008 Encounter for other general examination: Secondary | ICD-10-CM

## 2023-11-23 DIAGNOSIS — E559 Vitamin D deficiency, unspecified: Secondary | ICD-10-CM

## 2023-11-23 DIAGNOSIS — F411 Generalized anxiety disorder: Secondary | ICD-10-CM | POA: Diagnosis not present

## 2023-11-23 DIAGNOSIS — E538 Deficiency of other specified B group vitamins: Secondary | ICD-10-CM

## 2023-11-23 DIAGNOSIS — D638 Anemia in other chronic diseases classified elsewhere: Secondary | ICD-10-CM

## 2023-11-23 LAB — COMPREHENSIVE METABOLIC PANEL WITH GFR
AG Ratio: 1.3 (calc) (ref 1.0–2.5)
ALT: 14 U/L (ref 6–29)
AST: 15 U/L (ref 10–35)
Albumin: 3.9 g/dL (ref 3.6–5.1)
Alkaline phosphatase (APISO): 61 U/L (ref 37–153)
BUN: 15 mg/dL (ref 7–25)
CO2: 27 mmol/L (ref 20–32)
Calcium: 8.8 mg/dL (ref 8.6–10.4)
Chloride: 101 mmol/L (ref 98–110)
Creat: 0.68 mg/dL (ref 0.60–0.95)
Globulin: 2.9 g/dL (ref 1.9–3.7)
Glucose, Bld: 90 mg/dL (ref 65–99)
Potassium: 4 mmol/L (ref 3.5–5.3)
Sodium: 137 mmol/L (ref 135–146)
Total Bilirubin: 0.2 mg/dL (ref 0.2–1.2)
Total Protein: 6.8 g/dL (ref 6.1–8.1)
eGFR: 84 mL/min/1.73m2 (ref >=60)

## 2023-11-23 LAB — CBC
HCT: 36.5 % (ref 35.0–45.0)
Hemoglobin: 11.8 g/dL (ref 11.7–15.5)
MCH: 30.6 pg (ref 27.0–33.0)
MCHC: 32.3 g/dL (ref 32.0–36.0)
MCV: 94.8 fL (ref 80.0–100.0)
MPV: 9.6 fL (ref 7.5–12.5)
Platelets: 286 Thousand/uL (ref 140–400)
RBC: 3.85 Million/uL (ref 3.80–5.10)
RDW: 12.5 % (ref 11.0–15.0)
WBC: 7.6 Thousand/uL (ref 3.8–10.8)

## 2023-11-23 LAB — VITAMIN B12: Vitamin B-12: 298 pg/mL (ref 200–1100)

## 2023-11-23 LAB — VITAMIN D 25 HYDROXY (VIT D DEFICIENCY, FRACTURES): Vit D, 25-Hydroxy: 35 ng/mL (ref 30–100)

## 2023-11-23 MED ORDER — VITAMIN D3 25 MCG (1000 UT) PO CAPS
1000.0000 [IU] | ORAL_CAPSULE | Freq: Every day | ORAL | 3 refills | Status: AC
  Filled 2023-11-23: qty 90, 90d supply, fill #0
  Filled 2024-04-14 – 2024-04-16 (×4): qty 90, 90d supply, fill #1

## 2023-11-23 MED ORDER — VITAMIN D3 25 MCG (1000 UT) PO CAPS
1000.0000 [IU] | ORAL_CAPSULE | Freq: Every day | ORAL | 3 refills | Status: DC
Start: 2023-11-23 — End: 2023-11-23

## 2023-11-23 NOTE — Assessment & Plan Note (Signed)
 Patient encouraged to maintain heart healthy diet, regular exercise, adequate sleep. Consider daily probiotics. Take medications as prescribed

## 2023-11-23 NOTE — Assessment & Plan Note (Signed)
 Increase leafy greens, consider increased lean red meat and using cast iron cookware. Continue to monitor, report any concerns

## 2023-11-23 NOTE — Assessment & Plan Note (Signed)
 Supplement and monitor

## 2023-11-23 NOTE — Assessment & Plan Note (Signed)
 Stable on current medications. Denies SI/HI.

## 2023-11-23 NOTE — Patient Instructions (Addendum)
 Recommend getting Shingrix, Flu, and Covid vaccine at pharmacy.  Schedule annual wellness visit at checkout (Can schedule husbands).

## 2023-11-23 NOTE — Addendum Note (Signed)
 Addended by: ESTELLE GILLIS D on: 11/23/2023 04:28 PM   Modules accepted: Orders

## 2023-11-24 ENCOUNTER — Other Ambulatory Visit (HOSPITAL_COMMUNITY): Payer: Self-pay

## 2023-11-25 ENCOUNTER — Ambulatory Visit: Payer: Self-pay | Admitting: Student

## 2023-11-28 ENCOUNTER — Other Ambulatory Visit (HOSPITAL_COMMUNITY): Payer: Self-pay

## 2023-11-28 ENCOUNTER — Other Ambulatory Visit: Payer: Self-pay

## 2023-11-28 NOTE — Telephone Encounter (Signed)
 Can run on 12/16/23 to see how much eliquis  is

## 2023-11-29 ENCOUNTER — Other Ambulatory Visit (HOSPITAL_COMMUNITY): Payer: Self-pay

## 2023-12-01 ENCOUNTER — Other Ambulatory Visit (HOSPITAL_COMMUNITY): Payer: Self-pay

## 2023-12-03 ENCOUNTER — Other Ambulatory Visit (HOSPITAL_COMMUNITY): Payer: Self-pay

## 2023-12-06 ENCOUNTER — Other Ambulatory Visit: Payer: Self-pay | Admitting: Internal Medicine

## 2023-12-06 ENCOUNTER — Other Ambulatory Visit: Payer: Self-pay

## 2023-12-06 MED ORDER — DIPHENOXYLATE-ATROPINE 2.5-0.025 MG PO TABS
1.0000 | ORAL_TABLET | Freq: Four times a day (QID) | ORAL | 3 refills | Status: AC
  Filled 2023-12-06: qty 180, 23d supply, fill #0
  Filled 2024-03-06: qty 180, 23d supply, fill #1

## 2023-12-06 NOTE — Telephone Encounter (Signed)
 Please advise Sir, thank you.

## 2023-12-10 ENCOUNTER — Other Ambulatory Visit: Payer: Self-pay | Admitting: Family Medicine

## 2023-12-11 ENCOUNTER — Other Ambulatory Visit: Payer: Self-pay

## 2023-12-11 ENCOUNTER — Other Ambulatory Visit (HOSPITAL_COMMUNITY): Payer: Self-pay

## 2023-12-11 MED ORDER — ALPRAZOLAM 0.5 MG PO TABS
0.5000 mg | ORAL_TABLET | Freq: Every evening | ORAL | 3 refills | Status: DC | PRN
  Filled 2023-12-11 (×2): qty 30, 30d supply, fill #0
  Filled 2024-01-07: qty 30, 30d supply, fill #1
  Filled 2024-02-07 – 2024-02-08 (×2): qty 30, 30d supply, fill #2
  Filled 2024-03-08: qty 30, 30d supply, fill #3

## 2023-12-11 NOTE — Telephone Encounter (Signed)
 Requesting: alprazolam  0.5mg   Contract:09/03/20 UDS: 11/11/20 Last Visit: 11/23/23 w/ Harlene Jolly Next Visit: 05/22/24 Last Refill: 08/01/23 #30 and 0RF   Please Advise

## 2023-12-12 ENCOUNTER — Other Ambulatory Visit: Payer: Self-pay

## 2023-12-14 ENCOUNTER — Other Ambulatory Visit: Payer: Self-pay | Admitting: Family Medicine

## 2023-12-15 ENCOUNTER — Other Ambulatory Visit (HOSPITAL_COMMUNITY): Payer: Self-pay

## 2023-12-17 ENCOUNTER — Other Ambulatory Visit (HOSPITAL_COMMUNITY): Payer: Self-pay

## 2023-12-17 MED ORDER — GABAPENTIN 300 MG PO CAPS
300.0000 mg | ORAL_CAPSULE | Freq: Every day | ORAL | 1 refills | Status: AC
  Filled 2023-12-17 (×2): qty 90, 90d supply, fill #0
  Filled 2024-03-19 – 2024-03-21 (×3): qty 90, 90d supply, fill #1

## 2023-12-17 NOTE — Telephone Encounter (Signed)
 Patient called per test claim 141.00 for 90 days for eliquis . Patient to call and order

## 2023-12-18 ENCOUNTER — Other Ambulatory Visit (HOSPITAL_COMMUNITY): Payer: Self-pay

## 2023-12-18 ENCOUNTER — Other Ambulatory Visit: Payer: Self-pay

## 2023-12-19 ENCOUNTER — Other Ambulatory Visit (HOSPITAL_COMMUNITY): Payer: Self-pay

## 2023-12-20 ENCOUNTER — Telehealth: Payer: Self-pay | Admitting: Family Medicine

## 2023-12-20 NOTE — Telephone Encounter (Signed)
 Copied from CRM [id number]. Topic: Medicare AWV >> Dec 20, 2023 10:41 AM Nathanel DEL wrote: Reason for CRM: Called LVM 12/20/2023 to schedule AWV. Please schedule Virtual or Telehealth visits ONLY.   Nathanel Paschal; Care Guide Ambulatory Clinical Support [place] [facility] Direct Dial: [phone]

## 2023-12-23 ENCOUNTER — Other Ambulatory Visit (HOSPITAL_COMMUNITY): Payer: Self-pay

## 2023-12-25 ENCOUNTER — Other Ambulatory Visit: Payer: Self-pay | Admitting: Family Medicine

## 2023-12-25 ENCOUNTER — Other Ambulatory Visit: Payer: Self-pay

## 2023-12-25 ENCOUNTER — Other Ambulatory Visit (HOSPITAL_COMMUNITY): Payer: Self-pay

## 2023-12-25 DIAGNOSIS — F411 Generalized anxiety disorder: Secondary | ICD-10-CM

## 2023-12-25 DIAGNOSIS — G4700 Insomnia, unspecified: Secondary | ICD-10-CM

## 2023-12-25 MED ORDER — AMITRIPTYLINE HCL 100 MG PO TABS
100.0000 mg | ORAL_TABLET | Freq: Every day | ORAL | 1 refills | Status: AC
  Filled 2023-12-25: qty 90, 90d supply, fill #0
  Filled 2024-03-25: qty 90, 90d supply, fill #1

## 2023-12-28 ENCOUNTER — Other Ambulatory Visit (HOSPITAL_COMMUNITY): Payer: Self-pay

## 2023-12-29 ENCOUNTER — Other Ambulatory Visit (HOSPITAL_COMMUNITY): Payer: Self-pay

## 2023-12-31 ENCOUNTER — Other Ambulatory Visit: Payer: Self-pay | Admitting: Family Medicine

## 2023-12-31 DIAGNOSIS — I480 Paroxysmal atrial fibrillation: Secondary | ICD-10-CM

## 2024-01-01 ENCOUNTER — Other Ambulatory Visit (HOSPITAL_COMMUNITY): Payer: Self-pay

## 2024-01-01 MED ORDER — METOPROLOL SUCCINATE ER 25 MG PO TB24
25.0000 mg | ORAL_TABLET | Freq: Every day | ORAL | 0 refills | Status: DC
  Filled 2024-01-01: qty 90, 90d supply, fill #0

## 2024-01-07 ENCOUNTER — Other Ambulatory Visit (HOSPITAL_COMMUNITY): Payer: Self-pay

## 2024-01-08 ENCOUNTER — Other Ambulatory Visit: Payer: Self-pay | Admitting: Family Medicine

## 2024-01-08 ENCOUNTER — Other Ambulatory Visit: Payer: Self-pay

## 2024-01-08 ENCOUNTER — Other Ambulatory Visit (HOSPITAL_COMMUNITY): Payer: Self-pay

## 2024-01-09 ENCOUNTER — Other Ambulatory Visit: Payer: Self-pay

## 2024-01-09 ENCOUNTER — Other Ambulatory Visit (HOSPITAL_COMMUNITY): Payer: Self-pay

## 2024-01-09 MED ORDER — POTASSIUM CHLORIDE CRYS ER 20 MEQ PO TBCR
20.0000 meq | EXTENDED_RELEASE_TABLET | Freq: Every day | ORAL | 1 refills | Status: AC
  Filled 2024-01-09: qty 90, 90d supply, fill #0
  Filled 2024-01-11: qty 90, 90d supply, fill #1

## 2024-01-11 ENCOUNTER — Other Ambulatory Visit (HOSPITAL_COMMUNITY): Payer: Self-pay

## 2024-01-16 ENCOUNTER — Other Ambulatory Visit (HOSPITAL_COMMUNITY): Payer: Self-pay

## 2024-01-18 ENCOUNTER — Other Ambulatory Visit: Payer: Self-pay | Admitting: Internal Medicine

## 2024-01-20 ENCOUNTER — Other Ambulatory Visit (HOSPITAL_COMMUNITY): Payer: Self-pay

## 2024-01-21 ENCOUNTER — Other Ambulatory Visit: Payer: Self-pay

## 2024-01-21 MED ORDER — HEMOCYTE PLUS 106-1 MG PO CAPS
1.0000 | ORAL_CAPSULE | Freq: Two times a day (BID) | ORAL | 5 refills | Status: AC
  Filled 2024-01-21: qty 60, 30d supply, fill #0
  Filled 2024-04-18: qty 60, 30d supply, fill #1

## 2024-01-22 ENCOUNTER — Other Ambulatory Visit (HOSPITAL_COMMUNITY): Payer: Self-pay

## 2024-01-23 ENCOUNTER — Other Ambulatory Visit (HOSPITAL_COMMUNITY): Payer: Self-pay

## 2024-01-24 ENCOUNTER — Other Ambulatory Visit (HOSPITAL_COMMUNITY): Payer: Self-pay

## 2024-02-01 ENCOUNTER — Other Ambulatory Visit (HOSPITAL_COMMUNITY): Payer: Self-pay

## 2024-02-02 ENCOUNTER — Other Ambulatory Visit (HOSPITAL_COMMUNITY): Payer: Self-pay

## 2024-02-07 ENCOUNTER — Other Ambulatory Visit (HOSPITAL_COMMUNITY): Payer: Self-pay

## 2024-02-08 ENCOUNTER — Other Ambulatory Visit (HOSPITAL_COMMUNITY): Payer: Self-pay

## 2024-02-08 ENCOUNTER — Other Ambulatory Visit: Payer: Self-pay

## 2024-02-09 ENCOUNTER — Other Ambulatory Visit (HOSPITAL_COMMUNITY): Payer: Self-pay

## 2024-02-13 ENCOUNTER — Encounter: Admitting: Student

## 2024-02-20 ENCOUNTER — Other Ambulatory Visit (HOSPITAL_COMMUNITY): Payer: Self-pay

## 2024-02-27 ENCOUNTER — Other Ambulatory Visit (HOSPITAL_COMMUNITY): Payer: Self-pay

## 2024-02-27 ENCOUNTER — Other Ambulatory Visit: Payer: Self-pay

## 2024-02-27 ENCOUNTER — Other Ambulatory Visit: Payer: Self-pay | Admitting: Family Medicine

## 2024-02-27 MED ORDER — ZOLPIDEM TARTRATE 5 MG PO TABS
5.0000 mg | ORAL_TABLET | Freq: Every day | ORAL | 1 refills | Status: AC
  Filled 2024-02-27 – 2024-03-01 (×2): qty 90, 90d supply, fill #0

## 2024-02-28 ENCOUNTER — Other Ambulatory Visit (HOSPITAL_COMMUNITY): Payer: Self-pay

## 2024-02-28 DIAGNOSIS — J3089 Other allergic rhinitis: Secondary | ICD-10-CM | POA: Diagnosis not present

## 2024-02-28 DIAGNOSIS — J3081 Allergic rhinitis due to animal (cat) (dog) hair and dander: Secondary | ICD-10-CM | POA: Diagnosis not present

## 2024-02-28 DIAGNOSIS — J301 Allergic rhinitis due to pollen: Secondary | ICD-10-CM | POA: Diagnosis not present

## 2024-02-28 DIAGNOSIS — J4530 Mild persistent asthma, uncomplicated: Secondary | ICD-10-CM | POA: Diagnosis not present

## 2024-02-28 MED ORDER — LEVOCETIRIZINE DIHYDROCHLORIDE 5 MG PO TABS
5.0000 mg | ORAL_TABLET | Freq: Every evening | ORAL | 6 refills | Status: AC
  Filled 2024-02-28: qty 30, 30d supply, fill #0

## 2024-02-28 MED ORDER — HYDROXYZINE HCL 25 MG PO TABS
25.0000 mg | ORAL_TABLET | Freq: Three times a day (TID) | ORAL | 6 refills | Status: AC
  Filled 2024-02-28: qty 90, 30d supply, fill #0

## 2024-02-28 MED ORDER — FLUTICASONE PROPIONATE 50 MCG/ACT NA SUSP
NASAL | 6 refills | Status: AC
  Filled 2024-02-28: qty 16, 30d supply, fill #0

## 2024-02-28 MED ORDER — ALBUTEROL SULFATE HFA 108 (90 BASE) MCG/ACT IN AERS
INHALATION_SPRAY | RESPIRATORY_TRACT | 0 refills | Status: AC
  Filled 2024-02-28: qty 6.7, 30d supply, fill #0
  Filled 2024-03-28: qty 6.7, 17d supply, fill #0

## 2024-02-28 MED ORDER — AZELASTINE HCL 0.05 % OP SOLN
1.0000 [drp] | Freq: Two times a day (BID) | OPHTHALMIC | 6 refills | Status: AC
  Filled 2024-02-28: qty 6, 30d supply, fill #0

## 2024-02-28 MED ORDER — BUDESONIDE-FORMOTEROL FUMARATE 160-4.5 MCG/ACT IN AERO
2.0000 | INHALATION_SPRAY | Freq: Two times a day (BID) | RESPIRATORY_TRACT | 6 refills | Status: AC
  Filled 2024-02-28: qty 10.2, 30d supply, fill #0

## 2024-02-28 MED ORDER — MONTELUKAST SODIUM 10 MG PO TABS
10.0000 mg | ORAL_TABLET | Freq: Every evening | ORAL | 6 refills | Status: AC
  Filled 2024-02-28: qty 30, 30d supply, fill #0

## 2024-02-29 ENCOUNTER — Other Ambulatory Visit (HOSPITAL_COMMUNITY): Payer: Self-pay

## 2024-03-01 ENCOUNTER — Other Ambulatory Visit (HOSPITAL_COMMUNITY): Payer: Self-pay

## 2024-03-06 ENCOUNTER — Other Ambulatory Visit (HOSPITAL_COMMUNITY): Payer: Self-pay

## 2024-03-08 ENCOUNTER — Other Ambulatory Visit (HOSPITAL_COMMUNITY): Payer: Self-pay

## 2024-03-14 ENCOUNTER — Other Ambulatory Visit (HOSPITAL_COMMUNITY): Payer: Self-pay

## 2024-03-17 ENCOUNTER — Other Ambulatory Visit (HOSPITAL_COMMUNITY): Payer: Self-pay

## 2024-03-18 ENCOUNTER — Other Ambulatory Visit (HOSPITAL_COMMUNITY): Payer: Self-pay

## 2024-03-18 ENCOUNTER — Other Ambulatory Visit: Payer: Self-pay

## 2024-03-21 ENCOUNTER — Other Ambulatory Visit: Payer: Self-pay

## 2024-03-21 ENCOUNTER — Other Ambulatory Visit (HOSPITAL_COMMUNITY): Payer: Self-pay

## 2024-03-24 ENCOUNTER — Telehealth: Payer: Self-pay | Admitting: Pharmacy Technician

## 2024-03-24 ENCOUNTER — Other Ambulatory Visit: Payer: Self-pay

## 2024-03-24 ENCOUNTER — Other Ambulatory Visit (HOSPITAL_COMMUNITY): Payer: Self-pay

## 2024-03-24 DIAGNOSIS — I480 Paroxysmal atrial fibrillation: Secondary | ICD-10-CM

## 2024-03-24 MED ORDER — APIXABAN 5 MG PO TABS
5.0000 mg | ORAL_TABLET | Freq: Two times a day (BID) | ORAL | 1 refills | Status: AC
  Filled 2024-03-24: qty 180, 90d supply, fill #0

## 2024-03-24 NOTE — Telephone Encounter (Signed)
 Hi, the patient is requesting a refill on her eliquis  to please be sent to her [place] pharmacy. Thank you!       Per test claim-47.00 for 30 days for eliquis 

## 2024-03-25 ENCOUNTER — Other Ambulatory Visit (HOSPITAL_COMMUNITY): Payer: Self-pay

## 2024-03-25 NOTE — Telephone Encounter (Signed)
 Now being filled 141.00 for 90 days at pharmacy -left message on the machine for the patient

## 2024-03-27 ENCOUNTER — Other Ambulatory Visit: Payer: Self-pay | Admitting: Family Medicine

## 2024-03-27 DIAGNOSIS — I480 Paroxysmal atrial fibrillation: Secondary | ICD-10-CM

## 2024-03-28 ENCOUNTER — Other Ambulatory Visit (HOSPITAL_COMMUNITY): Payer: Self-pay

## 2024-03-28 MED ORDER — METOPROLOL SUCCINATE ER 25 MG PO TB24
25.0000 mg | ORAL_TABLET | Freq: Every day | ORAL | 0 refills | Status: AC
  Filled 2024-03-28: qty 90, 90d supply, fill #0

## 2024-04-07 ENCOUNTER — Other Ambulatory Visit: Payer: Self-pay | Admitting: Family Medicine

## 2024-04-08 ENCOUNTER — Other Ambulatory Visit (HOSPITAL_COMMUNITY): Payer: Self-pay

## 2024-04-08 ENCOUNTER — Other Ambulatory Visit: Payer: Self-pay

## 2024-04-08 MED ORDER — ALPRAZOLAM 0.5 MG PO TABS
0.5000 mg | ORAL_TABLET | Freq: Every evening | ORAL | 3 refills | Status: AC | PRN
  Filled 2024-04-08: qty 30, 30d supply, fill #0

## 2024-04-08 NOTE — Telephone Encounter (Signed)
 Requesting:alprazolam  0.5mg   Contract:09/03/20 UDS: 11/11/20 Last Visit: 11/23/23 w/ Harlene Jolly Next Visit: 05/22/24 Last Refill: 12/11/23 #30 and 0RF   Please Advise

## 2024-04-09 ENCOUNTER — Other Ambulatory Visit (HOSPITAL_COMMUNITY): Payer: Self-pay

## 2024-04-14 ENCOUNTER — Other Ambulatory Visit (HOSPITAL_COMMUNITY): Payer: Self-pay

## 2024-04-15 ENCOUNTER — Other Ambulatory Visit (HOSPITAL_COMMUNITY): Payer: Self-pay

## 2024-04-15 ENCOUNTER — Other Ambulatory Visit: Payer: Self-pay

## 2024-04-16 ENCOUNTER — Other Ambulatory Visit (HOSPITAL_COMMUNITY): Payer: Self-pay

## 2024-04-17 ENCOUNTER — Other Ambulatory Visit (HOSPITAL_COMMUNITY): Payer: Self-pay

## 2024-04-18 ENCOUNTER — Other Ambulatory Visit (HOSPITAL_COMMUNITY): Payer: Self-pay

## 2024-04-18 ENCOUNTER — Other Ambulatory Visit: Payer: Self-pay

## 2024-04-20 ENCOUNTER — Other Ambulatory Visit (HOSPITAL_COMMUNITY): Payer: Self-pay

## 2024-04-23 ENCOUNTER — Other Ambulatory Visit: Payer: Self-pay

## 2024-04-24 ENCOUNTER — Other Ambulatory Visit (HOSPITAL_COMMUNITY): Payer: Self-pay

## 2024-05-22 ENCOUNTER — Ambulatory Visit: Admitting: Family Medicine
